# Patient Record
Sex: Male | Born: 1945
Health system: Southern US, Community
[De-identification: ages and names within clinical notes are randomized; demographics above are authoritative.]

## PROBLEM LIST (undated history)

## (undated) DIAGNOSIS — N183 Chronic kidney disease, stage 3 unspecified: Secondary | ICD-10-CM

## (undated) DIAGNOSIS — I10 Essential (primary) hypertension: Secondary | ICD-10-CM

## (undated) DIAGNOSIS — K635 Polyp of colon: Secondary | ICD-10-CM

## (undated) DIAGNOSIS — N189 Chronic kidney disease, unspecified: Principal | ICD-10-CM

## (undated) DIAGNOSIS — G4730 Sleep apnea, unspecified: Secondary | ICD-10-CM

## (undated) DIAGNOSIS — N400 Enlarged prostate without lower urinary tract symptoms: Secondary | ICD-10-CM

## (undated) DIAGNOSIS — D631 Anemia in chronic kidney disease: Secondary | ICD-10-CM

## (undated) DIAGNOSIS — R0602 Shortness of breath: Secondary | ICD-10-CM

## (undated) DIAGNOSIS — K922 Gastrointestinal hemorrhage, unspecified: Secondary | ICD-10-CM

## (undated) DIAGNOSIS — M545 Low back pain, unspecified: Secondary | ICD-10-CM

## (undated) DIAGNOSIS — I2510 Atherosclerotic heart disease of native coronary artery without angina pectoris: Secondary | ICD-10-CM

## (undated) DIAGNOSIS — Z720 Tobacco use: Secondary | ICD-10-CM

## (undated) DIAGNOSIS — B353 Tinea pedis: Secondary | ICD-10-CM

## (undated) DIAGNOSIS — E119 Type 2 diabetes mellitus without complications: Secondary | ICD-10-CM

## (undated) DIAGNOSIS — E7800 Pure hypercholesterolemia, unspecified: Secondary | ICD-10-CM

## (undated) DIAGNOSIS — K219 Gastro-esophageal reflux disease without esophagitis: Secondary | ICD-10-CM

## (undated) DIAGNOSIS — G629 Polyneuropathy, unspecified: Secondary | ICD-10-CM

## (undated) DIAGNOSIS — I160 Hypertensive urgency: Secondary | ICD-10-CM

## (undated) DIAGNOSIS — E1140 Type 2 diabetes mellitus with diabetic neuropathy, unspecified: Secondary | ICD-10-CM

## (undated) DIAGNOSIS — G8929 Other chronic pain: Secondary | ICD-10-CM

## (undated) DIAGNOSIS — R319 Hematuria, unspecified: Secondary | ICD-10-CM

## (undated) DIAGNOSIS — E785 Hyperlipidemia, unspecified: Secondary | ICD-10-CM

## (undated) DIAGNOSIS — I639 Cerebral infarction, unspecified: Secondary | ICD-10-CM

## (undated) DIAGNOSIS — K5520 Angiodysplasia of colon without hemorrhage: Secondary | ICD-10-CM

## (undated) DIAGNOSIS — D509 Iron deficiency anemia, unspecified: Principal | ICD-10-CM

## (undated) DIAGNOSIS — C61 Malignant neoplasm of prostate: Secondary | ICD-10-CM

## (undated) DIAGNOSIS — E1142 Type 2 diabetes mellitus with diabetic polyneuropathy: Secondary | ICD-10-CM

## (undated) DIAGNOSIS — K31819 Angiodysplasia of stomach and duodenum without bleeding: Secondary | ICD-10-CM

## (undated) DIAGNOSIS — F17200 Nicotine dependence, unspecified, uncomplicated: Secondary | ICD-10-CM

## (undated) DIAGNOSIS — E1165 Type 2 diabetes mellitus with hyperglycemia: Secondary | ICD-10-CM

## (undated) DIAGNOSIS — D649 Anemia, unspecified: Secondary | ICD-10-CM

## (undated) DIAGNOSIS — K5521 Angiodysplasia of colon with hemorrhage: Secondary | ICD-10-CM

## (undated) DIAGNOSIS — I951 Orthostatic hypotension: Secondary | ICD-10-CM

## (undated) DIAGNOSIS — K760 Fatty (change of) liver, not elsewhere classified: Secondary | ICD-10-CM

## (undated) HISTORY — DX: Hypertensive urgency: I16.0

## (undated) HISTORY — DX: Malignant neoplasm of prostate: C61

## (undated) HISTORY — DX: Anemia, unspecified: D64.9

## (undated) HISTORY — DX: Tobacco use: Z72.0

## (undated) HISTORY — DX: Type 2 diabetes mellitus with hyperglycemia: E11.65

## (undated) HISTORY — DX: Nicotine dependence, unspecified, uncomplicated: F17.200

## (undated) HISTORY — DX: Essential (primary) hypertension: I10

## (undated) HISTORY — DX: Anemia in chronic kidney disease: D63.1

## (undated) HISTORY — DX: Chronic kidney disease, unspecified: N18.9

## (undated) HISTORY — DX: Type 2 diabetes mellitus with diabetic neuropathy, unspecified: E11.40

## (undated) HISTORY — DX: Angiodysplasia of colon with hemorrhage: K55.21

## (undated) HISTORY — DX: Tinea pedis: B35.3

## (undated) HISTORY — DX: Hyperlipidemia, unspecified: E78.5

## (undated) HISTORY — DX: Iron deficiency anemia, unspecified: D50.9

## (undated) HISTORY — DX: Shortness of breath: R06.02

---

## 1978-11-16 HISTORY — PX: SHOULDER OPEN ROTATOR CUFF REPAIR: SHX2407

## 1978-11-16 HISTORY — PX: LUMBAR DISC SURGERY: SHX700

## 1997-09-29 ENCOUNTER — Other Ambulatory Visit: Admission: RE | Admit: 1997-09-29 | Discharge: 1997-09-29 | Payer: Self-pay | Admitting: Gastroenterology

## 1998-08-10 ENCOUNTER — Ambulatory Visit (HOSPITAL_COMMUNITY): Admission: RE | Admit: 1998-08-10 | Discharge: 1998-08-10 | Payer: Self-pay | Admitting: Gastroenterology

## 1998-08-13 ENCOUNTER — Emergency Department (HOSPITAL_COMMUNITY): Admission: EM | Admit: 1998-08-13 | Discharge: 1998-08-13 | Payer: Self-pay | Admitting: Emergency Medicine

## 1998-08-13 ENCOUNTER — Encounter: Payer: Self-pay | Admitting: Gastroenterology

## 2000-01-07 ENCOUNTER — Encounter: Payer: Self-pay | Admitting: Gastroenterology

## 2000-01-07 ENCOUNTER — Encounter: Admission: RE | Admit: 2000-01-07 | Discharge: 2000-01-07 | Payer: Self-pay | Admitting: Gastroenterology

## 2000-11-03 ENCOUNTER — Encounter: Payer: Self-pay | Admitting: Family Medicine

## 2000-11-03 ENCOUNTER — Encounter: Admission: RE | Admit: 2000-11-03 | Discharge: 2000-11-03 | Payer: Self-pay | Admitting: Family Medicine

## 2000-11-12 ENCOUNTER — Encounter: Payer: Self-pay | Admitting: Gastroenterology

## 2000-11-12 ENCOUNTER — Ambulatory Visit (HOSPITAL_COMMUNITY): Admission: RE | Admit: 2000-11-12 | Discharge: 2000-11-12 | Payer: Self-pay | Admitting: Gastroenterology

## 2001-10-22 ENCOUNTER — Encounter: Payer: Self-pay | Admitting: Orthopedic Surgery

## 2001-10-29 ENCOUNTER — Inpatient Hospital Stay (HOSPITAL_COMMUNITY): Admission: RE | Admit: 2001-10-29 | Discharge: 2001-10-30 | Payer: Self-pay | Admitting: Orthopedic Surgery

## 2002-01-14 ENCOUNTER — Ambulatory Visit (HOSPITAL_COMMUNITY): Admission: RE | Admit: 2002-01-14 | Discharge: 2002-01-14 | Payer: Self-pay | Admitting: Orthopedic Surgery

## 2003-10-17 ENCOUNTER — Ambulatory Visit (HOSPITAL_COMMUNITY): Admission: RE | Admit: 2003-10-17 | Discharge: 2003-10-17 | Payer: Self-pay | Admitting: Gastroenterology

## 2004-03-21 ENCOUNTER — Ambulatory Visit: Payer: Self-pay | Admitting: Family Medicine

## 2004-07-19 ENCOUNTER — Ambulatory Visit: Payer: Self-pay | Admitting: Family Medicine

## 2004-07-29 ENCOUNTER — Ambulatory Visit: Payer: Self-pay | Admitting: Family Medicine

## 2004-08-01 ENCOUNTER — Ambulatory Visit: Payer: Self-pay | Admitting: Family Medicine

## 2004-10-30 ENCOUNTER — Ambulatory Visit: Payer: Self-pay | Admitting: Family Medicine

## 2005-10-14 ENCOUNTER — Ambulatory Visit: Payer: Self-pay | Admitting: Family Medicine

## 2005-10-27 ENCOUNTER — Ambulatory Visit: Payer: Self-pay | Admitting: Family Medicine

## 2005-12-01 ENCOUNTER — Ambulatory Visit: Payer: Self-pay | Admitting: Family Medicine

## 2006-01-16 ENCOUNTER — Ambulatory Visit: Payer: Self-pay | Admitting: Family Medicine

## 2007-02-12 ENCOUNTER — Encounter: Admission: RE | Admit: 2007-02-12 | Discharge: 2007-02-12 | Payer: Self-pay | Admitting: Orthopedic Surgery

## 2007-03-03 ENCOUNTER — Ambulatory Visit (HOSPITAL_COMMUNITY): Admission: RE | Admit: 2007-03-03 | Discharge: 2007-03-04 | Payer: Self-pay | Admitting: Orthopedic Surgery

## 2009-03-17 HISTORY — PX: INGUINAL HERNIA REPAIR: SUR1180

## 2009-06-11 ENCOUNTER — Emergency Department (HOSPITAL_BASED_OUTPATIENT_CLINIC_OR_DEPARTMENT_OTHER): Admission: EM | Admit: 2009-06-11 | Discharge: 2009-06-11 | Payer: Self-pay | Admitting: Emergency Medicine

## 2009-12-16 ENCOUNTER — Emergency Department (HOSPITAL_BASED_OUTPATIENT_CLINIC_OR_DEPARTMENT_OTHER): Admission: EM | Admit: 2009-12-16 | Discharge: 2009-12-16 | Payer: Self-pay | Admitting: Emergency Medicine

## 2009-12-16 ENCOUNTER — Ambulatory Visit: Payer: Self-pay | Admitting: Diagnostic Radiology

## 2010-05-30 LAB — COMPREHENSIVE METABOLIC PANEL
Albumin: 3.2 g/dL — ABNORMAL LOW (ref 3.5–5.2)
Calcium: 8.8 mg/dL (ref 8.4–10.5)
Potassium: 3.9 mEq/L (ref 3.5–5.1)
Sodium: 143 mEq/L (ref 135–145)
Total Bilirubin: 0.6 mg/dL (ref 0.3–1.2)
Total Protein: 7.1 g/dL (ref 6.0–8.3)

## 2010-05-30 LAB — URINALYSIS, ROUTINE W REFLEX MICROSCOPIC
Bilirubin Urine: NEGATIVE
Leukocytes, UA: NEGATIVE
Protein, ur: >300 mg/dL — AB
Specific Gravity, Urine: 1.029 (ref 1.005–1.030)
pH: 5.5 (ref 5.0–8.0)

## 2010-05-30 LAB — URINE CULTURE
Colony Count: NO GROWTH
Culture  Setup Time: 201110022005

## 2010-05-30 LAB — CBC
HCT: 38.1 % — ABNORMAL LOW (ref 39.0–52.0)
Hemoglobin: 12.6 g/dL — ABNORMAL LOW (ref 13.0–17.0)
MCH: 27.6 pg (ref 26.0–34.0)
MCHC: 33.1 g/dL (ref 30.0–36.0)
MCV: 83.6 fL (ref 78.0–100.0)
Platelets: 266 10*3/uL (ref 150–400)
RBC: 4.56 MIL/uL (ref 4.22–5.81)

## 2010-05-30 LAB — DIFFERENTIAL
Basophils Absolute: 0.1 10*3/uL (ref 0.0–0.1)
Basophils Relative: 1 % (ref 0–1)
Eosinophils Absolute: 0.2 10*3/uL (ref 0.0–0.7)
Neutrophils Relative %: 67 % (ref 43–77)

## 2010-05-30 LAB — URINE MICROSCOPIC-ADD ON

## 2010-06-10 LAB — BASIC METABOLIC PANEL
CO2: 28 mEq/L (ref 19–32)
Calcium: 9.2 mg/dL (ref 8.4–10.5)
Chloride: 105 mEq/L (ref 96–112)
GFR calc Af Amer: >60 mL/min (ref >60)
Glucose, Bld: 161 mg/dL — ABNORMAL HIGH (ref 70–99)
Potassium: 3.6 mEq/L (ref 3.5–5.1)
Sodium: 145 mEq/L (ref 135–145)

## 2010-06-10 LAB — CBC: RDW: 18.5 % — ABNORMAL HIGH (ref 11.5–15.5)

## 2010-06-10 LAB — HEMOCCULT GUIAC POC 1CARD (OFFICE): Fecal Occult Bld: POSITIVE

## 2010-07-30 NOTE — Op Note (Signed)
[name], YIELDING                  ACCOUNT NO.:  [id number]   MEDICAL RECORD NO.:  [id number]          PATIENT TYPE:  AMB   LOCATION:  DAY                          FACILITY:  [facility]   PHYSICIAN:  Marlowe Kays, M.D.  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   POSTOPERATIVE DIAGNOSIS:  Right leg pain secondary to lateral recess and  foraminal stenosis, L4-L5 and L5-S1 on the right, no disc herniation  identified.   OPERATION:  Total laminectomy L5, partial laminectomy L4 and sacrum on  the right, with decompression of the L4, L5, and S1 nerve roots, and  inspection of the L4-L5 and L5-S1 discs.   SURGEON:  Marlowe Kays, M.D.   ASSISTANT:  Jene Every, M.D.   ANESTHESIA:  General.   PATHOLOGY AND JUSTIFICATION FOR PROCEDURE:  He is an insulin dependent  diabetic but is having no left leg symptoms.  He has pain in the right  leg with paresthesias in the right foot.  Myelogram has demonstrated an  extradural defect at L4-L5 on the right and the accompanying CT scan  suspected disc herniations at both L4-L5 and L5-S1 on the right, with  some foraminal stenosis at L5-S1.  Consequently, the above mentioned  surgical procedure was performed.   DESCRIPTION OF PROCEDURE:  Prophylactic antibiotics, satisfactory  general anesthesia, knee chest position on the Slaton frame.  The back  was prepped with DuraPrep and with two spinal needles and lateral x-ray,  I tentatively located the L4-L5 and L5-S1 interspaces.  I then continued  draping the back in a sterile field, time out performed.  A midline  incision tagging what I thought were the spinous processes of L4 and L5  with Kocher clamps and  lateral x-ray confirmed that this was the case.  I then completed dissected soft tissue off the lamina of L4, L5, and the  sacrum, and placed a self-retaining McCullough retractor.  First, with  the operating microscope, we began the decompression, removing because  of his stenosis, essentially the entire lamina of L5 and I also  undermined the superior portion of the sacrum, the under surface of L4,  small curette and with 2 and later 3 mm Kerrison rongeurs, began  removing bone and ligamentum flavum.  When the dissection became more  difficult, we brought in the microscope and completed the decompression  at both levels.  We the L4, L5, and S1 nerve roots.  At the conclusion  of the decompression, there was no stenosis present.  We inspected both  the L5-S1 and L4-L5 discs, both of which were identified by x-ray,  particularly at L4-L5, which had demonstrated a more pronounced possible  disc herniation.  Both discs were hard.  At the conclusion of the case,  I felt like we had thoroughly decompressed the right lateral spinal  canal and the L3 foramen.  There was no unusual bleeding.  Gelfoam  soaked in thrombin was placed over both dissection sites and over the  dura.  The self-retaining retractors were removed, there was no unusual  bleeding.  I then closed the wound in layers with interrupted #1 Vicryl  in the fascia leaving a small area distally for egress of fluid, a  combination of #1 and 2-0 Vicryl in the subcutaneous tissue, and staples  in the skin.  Betadine and dry,  sterile dressing were applied.  He tolerated the procedure well and was  taken to the recovery room in satisfactory condition with no known  complications.  Estimated blood loss is, perhaps, 200 mL.  No blood  replacement.  In the PACU, his right leg pain and paresthesias had been  completely relieved.           ______________________________  Marlowe Kays, M.D.     JA/MEDQ  D:  03/03/2007  T:   03/03/2007  Job:  [id number]

## 2010-08-02 NOTE — H&P (Signed)
NAME:  [name], [name]                            ACCOUNT NO.:  [id number]   MEDICAL RECORD NO.:  [id number]                   PATIENT TYPE:  INP   LOCATION:  NA                                   FACILITY:  [facility]   PHYSICIAN:  James P. Aplington, M.D.            DATE OF BIRTH:  June 18, 1945   DATE OF ADMISSION:  10/29/2001  DATE OF DISCHARGE:                                HISTORY & PHYSICAL   CHIEF COMPLAINT:  Left shoulder pain.   HISTORY OF PRESENT ILLNESS:  The patient is a very pleasant 65 year old male  who has had chronic and continued left shoulder pain despite conservative  measures.  He has had both a cervical MRI as well as MRI of his left  shoulder.  The most remarkable finding was a full-thickness with mild  retraction rotator cuff tear.  He also had a noted paracentral protrusion at  C3-4 and to the left, which was his only finding.  He has demonstrated  significant rotator cuff weakness.  He is having difficulty with any  strength-type maneuvers.  At this time with his findings it is felt that the  first step would be to repair his rotator cuff.  The risks and benefits were  discussed with the patient, and at this time he wished to proceed.   PAST MEDICAL HISTORY:  1. Non-insulin-dependent.  2. GERD.   PAST SURGICAL HISTORY:  None.   ALLERGIES:  No known drug allergies.   MEDICATIONS:  1. Nexium 20 mg b.i.d.  2. Glucotrol XL 10 mg b.i.d.  3. Metformin 1000 mg q.d.   SOCIAL HISTORY:  He is right-hand dominant.  Works full-time in [employer].  He does smoke.  Uses rare alcohol.  Is married.  Will have some  help postoperatively.   FAMILY HISTORY:  Brother died of a heart attack at age 73.  He has two  brothers also with cancer, one in the lung, one in the stomach, and mother  with diabetes.   REVIEW OF SYSTEMS:  The patient denies any recent fevers, chills, night  sweats.  No bleeding tendencies.  CNS:  No blurred or double vision,  seizures,  headaches, or paralysis.  RESPIRATORY:  No shortness of breath,  productive cough, hemoptysis.  CARDIOVASCULAR:  No chest pain, angina,  orthopnea.  GASTROINTESTINAL:  No nausea, vomiting, constipation, melena, or  bloody stools.  GENITOURINARY:  No dysuria or hematuria.  MUSCULOSKELETAL:  As in present illness.   PHYSICAL EXAMINATION:  GENERAL:  Well-developed, well-nourished 65 year old  male.  VITAL SIGNS:  Blood pressure 128/82 today.  HEENT:  Normocephalic.  NECK:  Supple.  No lymphadenopathy.  No carotid bruits.  CHEST:  Clear to auscultation bilaterally.  No rales.  No rhonchi.  HEART:  Regular rate and rhythm.  No murmurs, gallops, rubs, heaves, or  thrills.  ABDOMEN:  Positive bowel sounds.  Soft and nontender.  EXTREMITIES:  Left upper extremity as in history of present illness.  He had  no pitting edema.  Positive distal pulses.   IMPRESSION:  1. Left rotator cuff tear.  2. Mild cervical degenerative disk.  3. Non-insulin-dependent.  4. Gastroesophageal reflux disease.   PLAN:  Open rotator cuff repair on the left shoulder.  He did receive  medical clearance from his medical physician, Dr. Kelle Darting.      Tracy A. Shuford, P.A.-C.                 Illene Labrador. Aplington, M.D.    TAS/MEDQ  D:  10/21/2001  T:  10/24/2001  Job:  [id number]   cc:   Tinnie Gens A. Tawanna Cooler, M.D. [facility]

## 2010-08-02 NOTE — Op Note (Signed)
[name], [name]                           ACCOUNT NO.:  [id number]   MEDICAL RECORD NO.:  [id number]                   PATIENT TYPE:  INP   LOCATION:  0477                                 FACILITY:  [facility]   PHYSICIAN:  Illene Labrador. Hill, M.D.            DATE OF BIRTH:  09-17-1945   DATE OF PROCEDURE:  10/29/2001  DATE OF DISCHARGE:  10/30/2001                                 OPERATIVE REPORT   PREOPERATIVE DIAGNOSIS:  Torn rotator cuff, left shoulder.   POSTOPERATIVE DIAGNOSIS:  .  Torn rotator cuff, left shoulder.   OPERATION:  1. Anterior acromionectomy.  2. Repair of torn rotator cuff, left shoulder.   SURGEON:  Illene Labrador. Hill, M.D.   ASSISTANT:  Clarene Reamer, PA-C   ANESTHESIA:  General.   PATHOLOGY AND JUSTIFICATION FOR PROCEDURE:  Significant pain left shoulder  with MRI demonstrating a tear of the anterior rotator cuff, specifically the  supraspinatus with some retraction.  At surgery, he had a much more  significant tear than the MRI would lead one to believe.  Most of it seemed  to be due to a very tight subacromial arch with maceration over a good bit  of the supraspinatus with tear all the way from the interior greater  tuberosity anteriorly and inferiorly.   PROCEDURE:  Prophylactic antibiotics, satisfactory general anesthesia, beach-  chair position on the Schlein frame.  The left shoulder girdle was prepped  and draped in a sterile field.  Ioban employed.  Vertical incision from  roughly the C joint down to the greater tuberosity.  The fascia on the  anterior acromion was opened in line with the skin incision from [facility] joint  down, splitting the deltoid for 1-2 cm.  The [facility] joint was identified with  Mellody Dance needle, and my initial anterior acromionectomy performed, protecting  the underlying rotator cuff with a Cobb elevator.  I had to do a good bit of  additional bone resection on the underneath surface of the anterior acromion  because of the severe  impingement problem.  He had a good bit of subdeltoid  bursa which I resected.  A lot of this was adherent to the rotator cuff up  beneath the remaining acromion.  Once I had obtained visualized of the  severe tear as described above was noted.  I began by mobilizing the shared  posterior rotator cuff and advancing it and attaching it to good lateral  remaining cuff at the greater tuberosity and just anterior to it with  multiple interrupted #1 Ethibond suture.  Once I had stabilized this  portion, I then began mobilizing the anteroinferior portion and attaching it  to the subscapularis fascia.  I then worked anteriorly and lateralward until  I was able to reattach the rotator cuff back from its point of origin.  I  also further stabilized this with a super Mitek anchor.  This seemed to give  a nice stable repair with good tissue integrity with his arm to his side.  He had had a preoperative stellate ganglion block so that no Marcaine or  adrenalin was used at this point.  The wound was irrigated with sterile  saline and closure performed.  The deltoid muscle and fascia over the  anterior acromion in one layer of interrupted #1 Vicryl with a good stable  repair.  The subcutaneous tissue was closed with a combination of 2-0 Vicryl  deep, 4-0 Vicryl superficially with Steri-Strips on the skin.  Dry sterile  dressing and shoulder immobilizer applied.  He tolerated the procedure well  and was taken to the recovery room in satisfactory condition with no known  complications.                                               [name], M.D.    JPA/MEDQ  D:  10/29/2001  T:  10/31/2001  Job:  [id number]

## 2010-08-02 NOTE — Op Note (Signed)
NAME:  [name], [name]                            ACCOUNT NO.:  [id number]   MEDICAL RECORD NO.:  [id number]                   PATIENT TYPE:  AMB   LOCATION:  ENDO                                 FACILITY:  [facility]   PHYSICIAN:  John C. Madilyn Fireman, M.D.                 DATE OF BIRTH:  04/04/1945   DATE OF PROCEDURE:  10/17/2003  DATE OF DISCHARGE:                                 OPERATIVE REPORT   PROCEDURE:  Colonoscopy.   INDICATION FOR PROCEDURE:  Family history of colon cancer in a first degree  relative.   PROCEDURE:  The patient was placed in the left lateral decubitus position  and placed on the pulse monitor with continuous low flow oxygen delivered by  nasal cannula.  He was sedated with 2 mg IV Versed in addition to medicine  to the medicine given for the previous EGD.  The Olympus video colonoscope  was inserted into the rectum and advanced to the cecum, confirmed by  transillumination of McBurney's point and visualization of the ileocecal  valve and appendiceal orifice.  The prep was excellent.  The cecum,  ascending, transverse, descending and sigmoid colon all appeared normal with  no masses, polyps, diverticula or other mucosal abnormalities.  The rectum  likewise appeared normal.  On retroflexed view, the anus revealed no obvious  internal hemorrhoids.  The scope was then withdrawn and the patient returned  to the recovery room in stable condition.  He tolerated the procedure well  and there were no immediate complications.   IMPRESSION:  Normal colonoscopy.   PLAN:  Repeat study in 5 years.                                               John C. Madilyn Fireman, M.D.    JCH/MEDQ  D:  10/17/2003  T:  10/17/2003  Job:  [id number]   cc:   Tinnie Gens A. Tawanna Cooler, M.D. [facility]

## 2010-08-02 NOTE — Op Note (Signed)
NAME:  [name], [name]                            ACCOUNT NO.:  [id number]   MEDICAL RECORD NO.:  [id number]                   PATIENT TYPE:  AMB   LOCATION:  ENDO                                 FACILITY:  [facility]   PHYSICIAN:  John C. Madilyn Fireman, M.D.                 DATE OF BIRTH:  04-11-1945   DATE OF PROCEDURE:  10/17/2003  DATE OF DISCHARGE:                                 OPERATIVE REPORT   PROCEDURE:  Esophagogastroduodenoscopy.   INDICATIONS FOR PROCEDURE:  Chronic nausea and heme positive stool.  Patient  is also undergoing colonoscopy due to a family history of colon cancer in a  first-degree relative.   PROCEDURE:  The patient was placed in the left lateral decubitus position  and placed on the pulse monitor with continuous low flow oxygen delivered by  nasal cannula.  He was sedated with 50 mcg IV fentanyl and 5 mg IV Versed.  The Olympus video endoscope was advanced under direct vision into the  oropharynx and esophagus.  The esophagus was straight and of normal caliber  with the squamocolumnar line at 38 cm.  There was no visible hiatal hernia,  ring, stricture, or other abnormalities of the GE junction.  The stomach was  entered, and a small amount of liquid secretions were suctioned from the  fundus.  Retroflexed view of the cardia was unremarkable.  The fundus and  body appeared normal.  The antrum showed erythema and granularity with a  slightly cholangiectasis appearance, consistent with mild-to-moderate antral  gastritis.  There were no focal erosions or ulcers.  The pylorus was no  deformity.  It allowed passage of the endoscope, tipping the duodenum.  Both  bulb and second portion were well inspected and appeared to be within normal  limits.  The scope was withdrawn back into the stomach, and a CLO test  obtained.  The scope was then withdrawn, and the patient returned to the  recovery room in stable condition.  He tolerated the procedure well, and  there were no  immediate complications.   IMPRESSION:  Mild antral gastritis, otherwise normal study.   PLAN:  Await CLO test.  Treat for eradication of Helicobacter if positive.  If negative, consider a course of Reglan for the possibility of viral  diabetic gastroparesis.  We will proceed to colonoscopy.                                               John C. Madilyn Fireman, M.D.    JCH/MEDQ  D:  10/17/2003  T:  10/17/2003  Job:  [id number]   cc:   Tinnie Gens A. Tawanna Cooler, M.D. [facility]

## 2010-10-04 ENCOUNTER — Other Ambulatory Visit: Payer: Self-pay | Admitting: Hematology & Oncology

## 2010-10-04 ENCOUNTER — Ambulatory Visit (HOSPITAL_BASED_OUTPATIENT_CLINIC_OR_DEPARTMENT_OTHER): Payer: Medicare Other | Admitting: Hematology & Oncology

## 2010-10-04 DIAGNOSIS — K2270 Barrett's esophagus without dysplasia: Secondary | ICD-10-CM

## 2010-10-04 DIAGNOSIS — E119 Type 2 diabetes mellitus without complications: Secondary | ICD-10-CM

## 2010-10-04 DIAGNOSIS — K219 Gastro-esophageal reflux disease without esophagitis: Secondary | ICD-10-CM

## 2010-10-04 DIAGNOSIS — D509 Iron deficiency anemia, unspecified: Secondary | ICD-10-CM

## 2010-10-04 LAB — CBC WITH DIFFERENTIAL (CANCER CENTER ONLY)
BASO%: 0.4 % (ref 0.0–2.0)
EOS%: 2.7 % (ref 0.0–7.0)
Eosinophils Absolute: 0.1 10*3/uL (ref 0.0–0.5)
LYMPH#: 1.6 10*3/uL (ref 0.9–3.3)
LYMPH%: 30.9 % (ref 14.0–48.0)
MCV: 79 fL — ABNORMAL LOW (ref 82–98)
MONO#: 0.5 10*3/uL (ref 0.1–0.9)
MONO%: 10.1 % (ref 0.0–13.0)
NEUT%: 55.9 % (ref 40.0–80.0)
Platelets: 284 10*3/uL (ref 145–400)
RDW: 20 % — ABNORMAL HIGH (ref 11.1–15.7)
WBC: 5.2 10*3/uL (ref 4.0–10.0)

## 2010-10-04 LAB — CHCC SATELLITE - SMEAR

## 2010-10-08 LAB — RETICULOCYTES (CHCC)
ABS Retic: 70.2 10*3/uL (ref 19.0–186.0)
Retic Ct Pct: 1.5 % (ref 0.4–2.3)

## 2010-10-08 LAB — HEMOGLOBINOPATHY EVALUATION
Hgb F Quant: 0.4 % (ref 0.0–2.0)
Hgb S Quant: 0 % (ref 0.0–0.0)

## 2010-10-08 LAB — TESTOSTERONE: Testosterone: 374.03 ng/dL (ref 250–890)

## 2010-10-08 LAB — IRON AND TIBC
Iron: 68 ug/dL (ref 42–165)
UIBC: 257 ug/dL

## 2010-10-08 LAB — TRANSFERRIN RECEPTOR, SOLUABLE: Transferrin Receptor, Soluble: 3.52 mg/L — ABNORMAL HIGH (ref 0.76–1.76)

## 2010-10-10 ENCOUNTER — Encounter (HOSPITAL_BASED_OUTPATIENT_CLINIC_OR_DEPARTMENT_OTHER): Payer: Medicare Other | Admitting: Hematology & Oncology

## 2010-10-10 DIAGNOSIS — D509 Iron deficiency anemia, unspecified: Secondary | ICD-10-CM

## 2010-11-14 ENCOUNTER — Encounter (HOSPITAL_BASED_OUTPATIENT_CLINIC_OR_DEPARTMENT_OTHER): Payer: Medicare Other | Admitting: Hematology & Oncology

## 2010-11-14 ENCOUNTER — Other Ambulatory Visit: Payer: Self-pay | Admitting: Hematology & Oncology

## 2010-11-14 ENCOUNTER — Ambulatory Visit (HOSPITAL_BASED_OUTPATIENT_CLINIC_OR_DEPARTMENT_OTHER)
Admission: RE | Admit: 2010-11-14 | Discharge: 2010-11-14 | Disposition: A | Discharge status: Home / Self Care | Payer: Medicare Other | Source: Ambulatory Visit | Attending: Hematology & Oncology | Admitting: Hematology & Oncology

## 2010-11-14 DIAGNOSIS — R52 Pain, unspecified: Secondary | ICD-10-CM

## 2010-11-14 DIAGNOSIS — K219 Gastro-esophageal reflux disease without esophagitis: Secondary | ICD-10-CM

## 2010-11-14 DIAGNOSIS — K2270 Barrett's esophagus without dysplasia: Secondary | ICD-10-CM

## 2010-11-14 DIAGNOSIS — R109 Unspecified abdominal pain: Secondary | ICD-10-CM | POA: Insufficient documentation

## 2010-11-14 DIAGNOSIS — E119 Type 2 diabetes mellitus without complications: Secondary | ICD-10-CM

## 2010-11-14 DIAGNOSIS — K824 Cholesterolosis of gallbladder: Secondary | ICD-10-CM | POA: Insufficient documentation

## 2010-11-14 DIAGNOSIS — D509 Iron deficiency anemia, unspecified: Secondary | ICD-10-CM

## 2010-11-14 DIAGNOSIS — N281 Cyst of kidney, acquired: Secondary | ICD-10-CM | POA: Insufficient documentation

## 2010-11-14 DIAGNOSIS — M549 Dorsalgia, unspecified: Secondary | ICD-10-CM | POA: Insufficient documentation

## 2010-11-14 LAB — CBC WITH DIFFERENTIAL (CANCER CENTER ONLY)
BASO%: 0.7 % (ref 0.0–2.0)
HCT: 34.3 % — ABNORMAL LOW (ref 38.7–49.9)
LYMPH%: 27.3 % (ref 14.0–48.0)
MCV: 82 fL (ref 82–98)
MONO#: 0.7 10*3/uL (ref 0.1–0.9)
NEUT%: 55.8 % (ref 40.0–80.0)
RDW: 17.9 % — ABNORMAL HIGH (ref 11.1–15.7)
WBC: 5.7 10*3/uL (ref 4.0–10.0)

## 2010-11-17 LAB — IRON AND TIBC
%SAT: 7 % — ABNORMAL LOW (ref 20–55)
Iron: 21 ug/dL — ABNORMAL LOW (ref 42–165)
TIBC: 295 ug/dL (ref 215–435)
UIBC: 274 ug/dL (ref 125–400)

## 2010-11-17 LAB — COMPREHENSIVE METABOLIC PANEL
AST: 14 U/L (ref 0–37)
Albumin: 3.7 g/dL (ref 3.5–5.2)
BUN: 7 mg/dL (ref 6–23)
Calcium: 8.7 mg/dL (ref 8.4–10.5)
Chloride: 105 mEq/L (ref 96–112)
Potassium: 3.7 mEq/L (ref 3.5–5.3)

## 2010-11-17 LAB — RETICULOCYTES (CHCC)
ABS Retic: 106.8 K/uL (ref 19.0–186.0)
RBC.: 4.27 MIL/uL (ref 4.22–5.81)
Retic Ct Pct: 2.5 % — ABNORMAL HIGH (ref 0.4–2.3)

## 2010-11-17 LAB — FERRITIN: Ferritin: 25 ng/mL (ref 22–322)

## 2010-11-19 ENCOUNTER — Encounter (HOSPITAL_BASED_OUTPATIENT_CLINIC_OR_DEPARTMENT_OTHER): Payer: Medicare Other | Admitting: Hematology & Oncology

## 2010-11-19 DIAGNOSIS — D509 Iron deficiency anemia, unspecified: Secondary | ICD-10-CM

## 2010-12-09 ENCOUNTER — Encounter (HOSPITAL_BASED_OUTPATIENT_CLINIC_OR_DEPARTMENT_OTHER): Payer: Medicare Other | Admitting: Hematology & Oncology

## 2010-12-09 ENCOUNTER — Other Ambulatory Visit: Payer: Self-pay | Admitting: Hematology & Oncology

## 2010-12-09 DIAGNOSIS — K219 Gastro-esophageal reflux disease without esophagitis: Secondary | ICD-10-CM

## 2010-12-09 DIAGNOSIS — K2270 Barrett's esophagus without dysplasia: Secondary | ICD-10-CM

## 2010-12-09 DIAGNOSIS — D509 Iron deficiency anemia, unspecified: Secondary | ICD-10-CM

## 2010-12-09 DIAGNOSIS — R634 Abnormal weight loss: Secondary | ICD-10-CM

## 2010-12-09 DIAGNOSIS — D539 Nutritional anemia, unspecified: Secondary | ICD-10-CM

## 2010-12-09 LAB — CBC WITH DIFFERENTIAL (CANCER CENTER ONLY)
BASO%: 0.6 % (ref 0.0–2.0)
EOS%: 3.3 % (ref 0.0–7.0)
Eosinophils Absolute: 0.2 10*3/uL (ref 0.0–0.5)
MCH: 29.4 pg (ref 28.0–33.4)
MCHC: 34.1 g/dL (ref 32.0–35.9)
MONO%: 17.3 % — ABNORMAL HIGH (ref 0.0–13.0)
NEUT#: 2.3 10*3/uL (ref 1.5–6.5)
Platelets: 325 10*3/uL (ref 145–400)
RBC: 3.84 10*6/uL — ABNORMAL LOW (ref 4.20–5.70)
RDW: 17.7 % — ABNORMAL HIGH (ref 11.1–15.7)

## 2010-12-09 LAB — IRON AND TIBC: %SAT: 15 % — ABNORMAL LOW (ref 20–55)

## 2010-12-09 LAB — RETICULOCYTES (CHCC): Retic Ct Pct: 3.6 % — ABNORMAL HIGH (ref 0.4–2.3)

## 2010-12-09 LAB — CHCC SATELLITE - SMEAR

## 2010-12-10 ENCOUNTER — Ambulatory Visit (INDEPENDENT_AMBULATORY_CARE_PROVIDER_SITE_OTHER)
Admission: RE | Admit: 2010-12-10 | Discharge: 2010-12-10 | Disposition: A | Discharge status: Home / Self Care | Payer: Medicare Other | Source: Ambulatory Visit | Attending: Hematology & Oncology | Admitting: Hematology & Oncology

## 2010-12-10 ENCOUNTER — Ambulatory Visit (HOSPITAL_BASED_OUTPATIENT_CLINIC_OR_DEPARTMENT_OTHER)
Admission: RE | Admit: 2010-12-10 | Discharge: 2010-12-10 | Disposition: A | Discharge status: Home / Self Care | Payer: Medicare Other | Source: Ambulatory Visit | Attending: Hematology & Oncology | Admitting: Hematology & Oncology

## 2010-12-10 DIAGNOSIS — K7689 Other specified diseases of liver: Secondary | ICD-10-CM

## 2010-12-10 DIAGNOSIS — R109 Unspecified abdominal pain: Secondary | ICD-10-CM

## 2010-12-10 DIAGNOSIS — R634 Abnormal weight loss: Secondary | ICD-10-CM

## 2010-12-10 DIAGNOSIS — F17200 Nicotine dependence, unspecified, uncomplicated: Secondary | ICD-10-CM | POA: Insufficient documentation

## 2010-12-10 DIAGNOSIS — R911 Solitary pulmonary nodule: Secondary | ICD-10-CM | POA: Insufficient documentation

## 2010-12-10 DIAGNOSIS — I10 Essential (primary) hypertension: Secondary | ICD-10-CM | POA: Insufficient documentation

## 2010-12-10 DIAGNOSIS — J984 Other disorders of lung: Secondary | ICD-10-CM

## 2010-12-10 DIAGNOSIS — K5900 Constipation, unspecified: Secondary | ICD-10-CM

## 2010-12-10 DIAGNOSIS — R0602 Shortness of breath: Secondary | ICD-10-CM | POA: Insufficient documentation

## 2010-12-10 DIAGNOSIS — E119 Type 2 diabetes mellitus without complications: Secondary | ICD-10-CM | POA: Insufficient documentation

## 2010-12-10 MED ORDER — IOHEXOL 300 MG/ML  SOLN
100.0000 mL | Freq: Once | INTRAMUSCULAR | Status: AC | PRN
  Administered 2010-12-10: 100 mL via INTRAVENOUS

## 2010-12-12 ENCOUNTER — Encounter (HOSPITAL_BASED_OUTPATIENT_CLINIC_OR_DEPARTMENT_OTHER): Payer: Medicare Other | Admitting: Hematology & Oncology

## 2010-12-12 DIAGNOSIS — D509 Iron deficiency anemia, unspecified: Secondary | ICD-10-CM

## 2010-12-20 LAB — URINALYSIS, ROUTINE W REFLEX MICROSCOPIC
Bilirubin Urine: NEGATIVE
Leukocytes, UA: NEGATIVE
Nitrite: NEGATIVE
Specific Gravity, Urine: 1.034 — ABNORMAL HIGH
pH: 5.5

## 2010-12-20 LAB — BASIC METABOLIC PANEL
BUN: 12
CO2: 27
Calcium: 9.5
Chloride: 103
Creatinine, Ser: 0.92
GFR calc Af Amer: >60
GFR calc non Af Amer: >60
Glucose, Bld: 210 — ABNORMAL HIGH
Potassium: 3.9
Sodium: 139

## 2010-12-20 LAB — CBC
MCHC: 33.2
Platelets: 328
RBC: 3.59 — ABNORMAL LOW
RDW: 17.2 — ABNORMAL HIGH

## 2010-12-20 LAB — HEMOGLOBIN AND HEMATOCRIT, BLOOD
HCT: 33.8 — ABNORMAL LOW
Hemoglobin: 11 — ABNORMAL LOW

## 2010-12-20 LAB — URINE MICROSCOPIC-ADD ON

## 2011-01-01 ENCOUNTER — Encounter (HOSPITAL_BASED_OUTPATIENT_CLINIC_OR_DEPARTMENT_OTHER): Payer: Medicare Other | Admitting: Hematology & Oncology

## 2011-01-01 ENCOUNTER — Other Ambulatory Visit: Payer: Self-pay | Admitting: Hematology & Oncology

## 2011-01-01 DIAGNOSIS — D649 Anemia, unspecified: Secondary | ICD-10-CM

## 2011-01-01 DIAGNOSIS — E119 Type 2 diabetes mellitus without complications: Secondary | ICD-10-CM

## 2011-01-01 DIAGNOSIS — D539 Nutritional anemia, unspecified: Secondary | ICD-10-CM

## 2011-01-01 DIAGNOSIS — D509 Iron deficiency anemia, unspecified: Secondary | ICD-10-CM

## 2011-01-01 DIAGNOSIS — N289 Disorder of kidney and ureter, unspecified: Secondary | ICD-10-CM

## 2011-01-01 DIAGNOSIS — K219 Gastro-esophageal reflux disease without esophagitis: Secondary | ICD-10-CM

## 2011-01-01 DIAGNOSIS — K2270 Barrett's esophagus without dysplasia: Secondary | ICD-10-CM

## 2011-01-01 LAB — CBC WITH DIFFERENTIAL (CANCER CENTER ONLY)
Eosinophils Absolute: 0.1 10*3/uL (ref 0.0–0.5)
MONO#: 0.7 10*3/uL (ref 0.1–0.9)
NEUT#: 2.9 10*3/uL (ref 1.5–6.5)
Platelets: 314 10*3/uL (ref 145–400)
RBC: 3.83 10*6/uL — ABNORMAL LOW (ref 4.20–5.70)
WBC: 5.2 10*3/uL (ref 4.0–10.0)

## 2011-01-01 LAB — CMP (CANCER CENTER ONLY)
ALT(SGPT): 21 U/L (ref 10–47)
CO2: 28 mEq/L (ref 18–33)
Calcium: 8.7 mg/dL (ref 8.0–10.3)
Chloride: 101 mEq/L (ref 98–108)
Sodium: 140 mEq/L (ref 128–145)
Total Bilirubin: 0.5 mg/dl (ref 0.20–1.60)
Total Protein: 7.6 g/dL (ref 6.4–8.1)

## 2011-01-01 LAB — CHCC SATELLITE - SMEAR

## 2011-01-01 LAB — LACTATE DEHYDROGENASE: LDH: 186 U/L (ref 94–250)

## 2011-01-01 LAB — IRON AND TIBC: %SAT: 9 % — ABNORMAL LOW (ref 20–55)

## 2011-01-07 ENCOUNTER — Encounter (HOSPITAL_BASED_OUTPATIENT_CLINIC_OR_DEPARTMENT_OTHER): Payer: Medicare Other | Admitting: Hematology & Oncology

## 2011-01-07 DIAGNOSIS — D509 Iron deficiency anemia, unspecified: Secondary | ICD-10-CM

## 2011-03-24 ENCOUNTER — Ambulatory Visit: Payer: Medicare Other

## 2011-03-24 ENCOUNTER — Other Ambulatory Visit (HOSPITAL_BASED_OUTPATIENT_CLINIC_OR_DEPARTMENT_OTHER): Payer: Medicare Other | Admitting: Lab

## 2011-03-24 ENCOUNTER — Ambulatory Visit (HOSPITAL_BASED_OUTPATIENT_CLINIC_OR_DEPARTMENT_OTHER): Payer: Medicare Other | Admitting: Hematology & Oncology

## 2011-03-24 ENCOUNTER — Other Ambulatory Visit: Payer: Self-pay | Admitting: Pharmacist

## 2011-03-24 ENCOUNTER — Encounter: Payer: Self-pay | Admitting: Hematology & Oncology

## 2011-03-24 ENCOUNTER — Other Ambulatory Visit: Payer: Self-pay | Admitting: Hematology & Oncology

## 2011-03-24 VITALS — BP 147/76 | HR 100 | Temp 97.3°F | Ht 69.0 in | Wt 165.0 lb

## 2011-03-24 DIAGNOSIS — D509 Iron deficiency anemia, unspecified: Secondary | ICD-10-CM

## 2011-03-24 DIAGNOSIS — K922 Gastrointestinal hemorrhage, unspecified: Secondary | ICD-10-CM

## 2011-03-24 HISTORY — DX: Iron deficiency anemia, unspecified: D50.9

## 2011-03-24 LAB — IRON AND TIBC
Iron: <10 ug/dL — ABNORMAL LOW (ref 42–165)
UIBC: 334 ug/dL (ref 125–400)

## 2011-03-24 LAB — CBC WITH DIFFERENTIAL (CANCER CENTER ONLY)
BASO#: 0.1 10*3/uL (ref 0.0–0.2)
Eosinophils Absolute: 0.1 10*3/uL (ref 0.0–0.5)
HGB: 7.8 g/dL — ABNORMAL LOW (ref 13.0–17.1)
MCH: 27.4 pg — ABNORMAL LOW (ref 28.0–33.4)
MONO#: 0.8 10*3/uL (ref 0.1–0.9)
MONO%: 10.9 % (ref 0.0–13.0)
NEUT#: 5.1 10*3/uL (ref 1.5–6.5)
RBC: 2.85 10*6/uL — ABNORMAL LOW (ref 4.20–5.70)
WBC: 7.5 10*3/uL (ref 4.0–10.0)

## 2011-03-24 MED ORDER — SODIUM CHLORIDE 0.9 % IV SOLN
Freq: Once | INTRAVENOUS | Status: DC
  Administered 2011-03-24: 16:00:00 via INTRAVENOUS

## 2011-03-24 MED ORDER — SODIUM CHLORIDE 0.9 % IV SOLN
1020.0000 mg | Freq: Once | INTRAVENOUS | Status: DC
  Administered 2011-03-24: 1020 mg via INTRAVENOUS
  Filled 2011-03-24: qty 34

## 2011-03-24 NOTE — Progress Notes (Signed)
This office note has been dictated.

## 2011-03-24 NOTE — Progress Notes (Signed)
CC:   [name], M.D.  DIAGNOSES: 1. Recurrent iron-deficiency anemia secondary to gastrointestinal     bleeding. 2. Anemia of renal insufficiency. 3. Insulin-dependent diabetes.  CURRENT THERAPY:  IV iron as indicated.  INTERVAL HISTORY:  [name] comes in for followup.  He is getting over a "cold."  Unfortunately, he is also having black stools.  He says these have been going on for a few weeks.  Of course, he did not say anything about this to his family doctor.  We did give him iron back in October.  At that point in time, his ferritin, I think, was 23.  He got 1,020 mg of iron.  He has not had any abdominal pain.  He is having no cough.  He is having no weight loss or weight gain.  He did well over the holidays.  He did not note any fever.  PHYSICAL EXAMINATION:  General Appearance:  This is a fairly well- developed, well-nourished, black gentleman in no obvious distress. Vital Signs:  Temperature of 97.3.  Pulse 100.  Respiratory rate 16. Blood pressure 147/76.  Weight is 165.  Head and Neck Exam: Normocephalic, atraumatic skull.  There are no ocular or oral lesions. There are no palpable cervical or supraclavicular lymph nodes.  Lungs: Clear to percussion and auscultation bilaterally.  Cardiac Exam: Regular rate and rhythm with a normal S1 and S2.  There are no murmurs, rubs, or bruits.  Abdominal Exam:  Soft with good bowel sounds.  There is no palpable abdominal mass.  There is no fluid wave.  There is no palpable hepatosplenomegaly.  Rectal Exam:  No external hemorrhoids. His prostate is smooth and regular.  No prostate nodules are noted.  His stools are brown but grossly heme positive.  Extremities:  No clubbing, cyanosis, or edema.  Neurological Exam:  No focal neurological deficits.  LABORATORY STUDIES:  White cell count 7.5, hemoglobin 7.8, hematocrit 24, platelet count 393.  MCV is 84.  IMPRESSION:  [name] is a 66 year old gentleman with recurrent  iron- deficiency anemia.  When we last saw him in October, his hemoglobin was 11.6.  Again, his ferritin was low as well as his iron saturation.  He got iron then.  It is apparent that this gastrointestinal bleeding is a real problem for him.  I told him that he really needs to go back to Dr. Julio Sicks and have this looked at.  He does see a gastroenterologist in [facility].  We went ahead and gave him 1,020 mg of IV iron today.  It is possible that he may need some Aranesp.  He has never had Aranesp before.  Hopefully, the iron will help him out.  However, if he continues have the gastrointestinal bleeding, this will become a problem for him.  It is possible he may have some arteriovenous malformations or angiodysplasia within the small bowel.  I want to see him back in 1 month.  We will go ahead and check his hemoglobin at that point in time.  Of note, his erythropoietin level was 56 back in July.  As such, he should respond well to Aranesp if necessary.    ______________________________ [name], M.D. PRE/MEDQ  D:  03/24/2011  T:  03/24/2011  Job:  [id number]

## 2011-03-24 NOTE — Progress Notes (Signed)
Addended by: Anselm Jungling on: 03/24/2011 03:09 PM   Modules accepted: Orders

## 2011-04-04 ENCOUNTER — Telehealth: Payer: Self-pay | Admitting: Hematology & Oncology

## 2011-04-04 NOTE — Telephone Encounter (Signed)
Pt called and cx 04/22/11 apt and resch for 04/25/11

## 2011-04-22 ENCOUNTER — Other Ambulatory Visit: Payer: Medicare Other | Admitting: Lab

## 2011-04-22 ENCOUNTER — Ambulatory Visit: Payer: Medicare Other | Admitting: Hematology & Oncology

## 2011-04-25 ENCOUNTER — Other Ambulatory Visit: Payer: Medicare Other | Admitting: Lab

## 2011-04-25 ENCOUNTER — Ambulatory Visit: Payer: Medicare Other | Admitting: Hematology & Oncology

## 2011-04-25 ENCOUNTER — Telehealth: Payer: Self-pay | Admitting: Hematology & Oncology

## 2011-04-25 NOTE — Telephone Encounter (Signed)
Pt moved 2-7 to 2-28 he had a death in the family

## 2011-05-14 ENCOUNTER — Ambulatory Visit (HOSPITAL_BASED_OUTPATIENT_CLINIC_OR_DEPARTMENT_OTHER): Payer: Medicare Other | Admitting: Hematology & Oncology

## 2011-05-14 ENCOUNTER — Encounter: Payer: Self-pay | Admitting: Hematology & Oncology

## 2011-05-14 ENCOUNTER — Ambulatory Visit (HOSPITAL_BASED_OUTPATIENT_CLINIC_OR_DEPARTMENT_OTHER): Payer: Medicare Other

## 2011-05-14 ENCOUNTER — Other Ambulatory Visit (HOSPITAL_BASED_OUTPATIENT_CLINIC_OR_DEPARTMENT_OTHER): Payer: Medicare Other | Admitting: Lab

## 2011-05-14 DIAGNOSIS — D631 Anemia in chronic kidney disease: Secondary | ICD-10-CM

## 2011-05-14 DIAGNOSIS — D500 Iron deficiency anemia secondary to blood loss (chronic): Secondary | ICD-10-CM

## 2011-05-14 DIAGNOSIS — N289 Disorder of kidney and ureter, unspecified: Secondary | ICD-10-CM

## 2011-05-14 DIAGNOSIS — N189 Chronic kidney disease, unspecified: Secondary | ICD-10-CM

## 2011-05-14 DIAGNOSIS — D649 Anemia, unspecified: Secondary | ICD-10-CM

## 2011-05-14 DIAGNOSIS — D509 Iron deficiency anemia, unspecified: Secondary | ICD-10-CM

## 2011-05-14 HISTORY — DX: Anemia in chronic kidney disease: D63.1

## 2011-05-14 HISTORY — DX: Anemia in chronic kidney disease: N18.9

## 2011-05-14 LAB — CBC WITH DIFFERENTIAL (CANCER CENTER ONLY)
BASO#: 0 10*3/uL (ref 0.0–0.2)
Eosinophils Absolute: 0.1 10*3/uL (ref 0.0–0.5)
HGB: 8.2 g/dL — ABNORMAL LOW (ref 13.0–17.1)
LYMPH#: 1.6 10*3/uL (ref 0.9–3.3)
MCH: 26.3 pg — ABNORMAL LOW (ref 28.0–33.4)
MONO#: 0.7 10*3/uL (ref 0.1–0.9)
NEUT#: 3.3 10*3/uL (ref 1.5–6.5)
RBC: 3.12 10*6/uL — ABNORMAL LOW (ref 4.20–5.70)

## 2011-05-14 LAB — IRON AND TIBC: TIBC: 293 ug/dL (ref 215–435)

## 2011-05-14 LAB — FERRITIN: Ferritin: 16 ng/mL — ABNORMAL LOW (ref 22–322)

## 2011-05-14 LAB — RETICULOCYTES (CHCC)
ABS Retic: 98.6 10*3/uL (ref 19.0–186.0)
RBC.: 3.18 MIL/uL — ABNORMAL LOW (ref 4.22–5.81)

## 2011-05-14 MED ORDER — DARBEPOETIN ALFA-POLYSORBATE 300 MCG/0.6ML IJ SOLN
300.0000 ug | Freq: Once | INTRAMUSCULAR | Status: AC
  Administered 2011-05-14: 300 ug via SUBCUTANEOUS

## 2011-05-14 MED ORDER — SODIUM CHLORIDE 0.9 % IV SOLN
1020.0000 mg | Freq: Once | INTRAVENOUS | Status: AC
  Administered 2011-05-14: 1020 mg via INTRAVENOUS
  Filled 2011-05-14: qty 34

## 2011-05-14 NOTE — Progress Notes (Signed)
This office note has been dictated.

## 2011-05-15 NOTE — Progress Notes (Signed)
CC:   [name], M.D.  DIAGNOSES: 1. Recurrent iron deficiency anemia secondary to gastrointestinal     bleed. 2. Anemia secondary to renal insufficiency. 3. Poorly controlled insulin-dependent diabetes.  CURRENT THERAPY: 1. IV iron as indicated. 2. The patient to start Aranesp 300 mcg subcutaneously every 3-4 weeks     for hemoglobin less than 10.  INTERIM HISTORY:  [name] comes in for follow-up.  We last saw back in early January.  At that point in time, his hemoglobin was 7.8.  His stools were grossly heme positive.  We did go ahead and give him IV iron.  He is being followed by GI in [facility].  He did have an upper endoscopy and colonoscopy.  From what he says, these did not show any obvious bleeding source.  He recently underwent a capsule endoscopy this past Monday.  He does not know the results.  He is having some pain in the right inguinal region where he had a hernia repair a few years ago.  He does feel tired.  He does feel worn out.  His stools are black he says.  PHYSICAL EXAMINATION:  General Appearance:  This is a thin but well- nourished, black gentleman in no obvious distress.  Vital Signs:  Show a temperature of 97.5, pulse 118, respiratory rate 14, blood pressure 118/47.  Weight is 160.  Head and Neck Exam:  Shows a normocephalic, atraumatic skull.  There are no ocular or oral lesions.  There are no palpable cervical or supraclavicular lymph nodes.  Lungs:  Clear to percussion and auscultation bilaterally.  Cardiac Exam:  Tachycardiac but regular.  There are no murmurs, rubs or bruits.  Abdominal Exam: Soft with good bowel sounds.  There is no palpable abdominal mass. There is no palpable hepatosplenomegaly.  Back Exam:  No tenderness over the spine, ribs or hips.  Extremities:  Show no clubbing, cyanosis or edema.  Neurological Exam:  Shows no focal neurological deficits.  Skin Exam:  No rashes, ecchymosis, or petechia.  LABORATORY STUDIES:   White cell count is 5.8, hemoglobin 8.2, hematocrit 25.9, platelet count is 372.  MCV is 83.  IMPRESSION:  [name] is a 66 year old gentleman with recurrent iron deficiency anemia.  He has gastrointestinal blood loss.  He also has renal insufficiency secondary to his poorly controlled diabetes.  We will go ahead and give him IV iron today.  I will give him a dose of 1020 mg.  We will also go ahead and give him a dose of Aranesp 300 mcg.  This hopefully will "jump start" his bone marrow so that he is not as anemic and not as fatigued.  I will plan to see him back in another month for follow-up.    ______________________________ [name], M.D. PRE/MEDQ  D:  05/14/2011  T:  05/15/2011  Job:  [id number]

## 2011-06-04 ENCOUNTER — Other Ambulatory Visit (HOSPITAL_BASED_OUTPATIENT_CLINIC_OR_DEPARTMENT_OTHER): Payer: Medicare Other | Admitting: Lab

## 2011-06-04 ENCOUNTER — Ambulatory Visit: Payer: Medicare Other

## 2011-06-04 ENCOUNTER — Other Ambulatory Visit: Payer: Self-pay | Admitting: *Deleted

## 2011-06-04 ENCOUNTER — Ambulatory Visit (HOSPITAL_BASED_OUTPATIENT_CLINIC_OR_DEPARTMENT_OTHER): Payer: Medicare Other | Admitting: Hematology & Oncology

## 2011-06-04 VITALS — BP 149/80 | HR 89 | Temp 97.1°F | Ht 69.0 in | Wt 161.0 lb

## 2011-06-04 DIAGNOSIS — H1013 Acute atopic conjunctivitis, bilateral: Secondary | ICD-10-CM

## 2011-06-04 DIAGNOSIS — D509 Iron deficiency anemia, unspecified: Secondary | ICD-10-CM

## 2011-06-04 DIAGNOSIS — K922 Gastrointestinal hemorrhage, unspecified: Secondary | ICD-10-CM

## 2011-06-04 DIAGNOSIS — N189 Chronic kidney disease, unspecified: Secondary | ICD-10-CM

## 2011-06-04 LAB — IRON AND TIBC
%SAT: 13 % — ABNORMAL LOW (ref 20–55)
Iron: 39 ug/dL — ABNORMAL LOW (ref 42–165)
UIBC: 251 ug/dL (ref 125–400)

## 2011-06-04 LAB — CBC WITH DIFFERENTIAL (CANCER CENTER ONLY)
BASO#: 0 10*3/uL (ref 0.0–0.2)
EOS%: 2.6 % (ref 0.0–7.0)
Eosinophils Absolute: 0.1 10*3/uL (ref 0.0–0.5)
HGB: 11.1 g/dL — ABNORMAL LOW (ref 13.0–17.1)
LYMPH#: 0.9 10*3/uL (ref 0.9–3.3)
NEUT#: 2.9 10*3/uL (ref 1.5–6.5)
RBC: 4.02 10*6/uL — ABNORMAL LOW (ref 4.20–5.70)

## 2011-06-04 LAB — RETICULOCYTES (CHCC): Retic Ct Pct: 3.1 % — ABNORMAL HIGH (ref 0.4–2.3)

## 2011-06-04 MED ORDER — AZELASTINE HCL 0.05 % OP SOLN: OPHTHALMIC | Status: DC

## 2011-06-04 NOTE — Progress Notes (Signed)
This office note has been dictated.

## 2011-06-04 NOTE — Progress Notes (Signed)
CC:   [name], M.D.  DIAGNOSES: 1. Recurrent iron-deficiency anemia - gastrointestinal blood loss. 2. Anemia secondary to renal insufficiency. 3. Insulin-dependent diabetes.  CURRENT THERAPY: 1. Aranesp 300 mcg subcutaneously as needed for a hemoglobin less than     10. 2. IV iron as indicated.  INTERIM HISTORY:  Mr. Brum comes in for followup.  We did start him on Aranesp.  This has helped him out quite a bit.  He feels well.  He is still having issues with the GI bleeding.  He has seen Gastroenterology. He said he had an upper endoscopy and colonoscopy.  Both did not show any evidence of bleeding.  He then underwent a capsule endoscopy.  He does not have the results back from this.  We did give him iron with Feraheme back in late February.  He got 1,020 mg of Feraheme.  He feels well.  He does not feel as tired.  He has no "cravings."  He has had no problems with nausea or vomiting.  He has had no fever.  He has had no cough.  He has had no leg swelling.  PHYSICAL EXAMINATION:  General Appearance:  This is a well-developed, well-nourished, black gentleman in no obvious distress.  Vital Signs: Temperature 97.1.  Pulse 89.  Respiratory rate 16.  Blood pressure 148/80.  Weight is 161.  Head and Neck Exam:  A normocephalic, atraumatic skull.  There are no ocular or oral lesions.  There are no palpable cervical or supraclavicular lymph nodes.  Lungs:  Clear bilaterally.  Cardiac Exam:  Regular rate and rhythm with a normal S1 and S2.  There are no murmurs, rubs, or bruits.  Abdominal Exam:  Soft with good bowel sounds.  There is no fluid wave.  There is no palpable hepatosplenomegaly.  Back Exam:  No tenderness over the spine, ribs, or hips.  Extremities:  No clubbing, cyanosis, or edema.  Neurological Exam:  No focal neurological deficits.  Skin Exam:  No rash, ecchymosis, or petechia.  LABORATORY STUDIES:  White cell count is 4.5, hemoglobin 11.1, hematocrit 34.8,  platelet count is 373.  MCV is 87.  IMPRESSION:  Mr. Gapinski is a 66 year old gentleman with recurrent iron- deficiency anemia.  He has gastrointestinal bleeding.  One has to suspect that he has a either arteriovenous malformations or angiodysplasia within the small bowel.  Hopefully, the capsule endoscopy will show a source of blood loss.  I suspect that he will probably need iron in the future.  He does not need any Aranesp today.  We will plan to get him back to see [place] in another 6 weeks.    ______________________________ [name], M.D. PRE/MEDQ  D:  06/04/2011  T:  06/04/2011  Job:  [id number]

## 2011-06-04 NOTE — Progress Notes (Signed)
Addended by: Arlan Organ R on: 06/04/2011 02:58 PM   Modules accepted: Orders

## 2011-06-04 NOTE — Progress Notes (Signed)
[name] comes in today, CBC checked, [name] injection not given due to parameters.  Hgb was 11.1.  [name] made aware, seen by Dr. Myna Hidalgo. Teola Bradley, Lucetta Baehr [employer]

## 2011-06-13 ENCOUNTER — Other Ambulatory Visit: Payer: Self-pay

## 2011-06-25 ENCOUNTER — Ambulatory Visit: Payer: Medicare Other

## 2011-06-25 ENCOUNTER — Ambulatory Visit: Payer: Medicare Other | Admitting: Hematology & Oncology

## 2011-06-25 ENCOUNTER — Other Ambulatory Visit: Payer: Medicare Other | Admitting: Lab

## 2011-06-25 ENCOUNTER — Other Ambulatory Visit (HOSPITAL_BASED_OUTPATIENT_CLINIC_OR_DEPARTMENT_OTHER): Payer: Medicare Other | Admitting: Lab

## 2011-06-25 DIAGNOSIS — D509 Iron deficiency anemia, unspecified: Secondary | ICD-10-CM

## 2011-06-25 LAB — CBC WITH DIFFERENTIAL (CANCER CENTER ONLY)
BASO#: 0 10*3/uL (ref 0.0–0.2)
BASO%: 0.4 % (ref 0.0–2.0)
EOS%: 3.6 % (ref 0.0–7.0)
HGB: 11.1 g/dL — ABNORMAL LOW (ref 13.0–17.1)
LYMPH#: 1.5 10*3/uL (ref 0.9–3.3)
MCH: 27.4 pg — ABNORMAL LOW (ref 28.0–33.4)
MCHC: 33.3 g/dL (ref 32.0–35.9)
MONO%: 18 % — ABNORMAL HIGH (ref 0.0–13.0)
NEUT#: 2.7 10*3/uL (ref 1.5–6.5)
NEUT%: 50.3 % (ref 40.0–80.0)
RDW: 16.2 % — ABNORMAL HIGH (ref 11.1–15.7)

## 2011-06-25 MED ORDER — MECLIZINE HCL 25 MG PO TABS: 25.0000 mg | ORAL_TABLET | Freq: Three times a day (TID) | ORAL | Status: AC | PRN

## 2011-06-25 NOTE — Progress Notes (Signed)
Pt came in the office for an injection asking to speak Dr Myna Hidalgo regarding his dizziness. Aranesp was held today for hgb 11.1 (stable from last 3 CBC checks). Mr. Sciuto was seen by his PCP who told him that his dizziness was related to allergies. Dr. Myna Hidalgo ordered Antivert 25 mg q8h prn. Pt instructed not to drive after he takes his first dose as it may cause drowsiness. He verbalized understanding.

## 2011-06-25 NOTE — Patient Instructions (Signed)
Hydroxyzine capsules or tablets  What is this medicine?  HYDROXYZINE (hye DROX i zeen) is an antihistamine. This medicine is used to treat allergy symptoms. It is also used to treat anxiety and tension. This medicine can be used with other medicines to induce sleep before surgery.  This medicine may be used for other purposes; ask your health care provider or pharmacist if you have questions.  What should I tell my health care provider before I take this medicine? They need to know if you have any of these conditions: -any chronic illness -difficulty passing urine -glaucoma -heart disease -kidney disease -liver disease -lung disease -an unusual or allergic reaction to hydroxyzine, cetirizine, other medicines, foods, dyes, or preservatives -pregnant or trying to get pregnant -breast-feeding  How should I use this medicine?  Take this medicine by mouth with a full glass of water. Follow the directions on the prescription label. You may take this medicine with food or on an empty stomach. Take your medicine at regular intervals. Do not take your medicine more often than directed. Talk to your pediatrician regarding the use of this medicine in children. Special care may be needed. While this drug may be prescribed for children as young as 20 years of age for selected conditions, precautions do apply. Patients over 7 years old may have a stronger reaction and need a smaller dose. Overdosage: If you think you have taken too much of this medicine contact a poison control center or emergency room at once. NOTE: This medicine is only for you. Do not share this medicine with others.  What if I miss a dose?  If you miss a dose, take it as soon as you can. If it is almost time for your next dose, take only that dose. Do not take double or extra doses. What may interact with this medicine? -alcohol -barbiturate medicines for sleep or seizures -medicines for colds, allergies -medicines for  depression, anxiety, or emotional disturbances -medicines for pain -medicines for sleep -muscle relaxants This list may not describe all possible interactions. Give your health care provider a list of all the medicines, herbs, non-prescription drugs, or dietary supplements you use. Also tell them if you smoke, drink alcohol, or use illegal drugs. Some items may interact with your medicine. What should I watch for while using this medicine?  Tell your doctor or health care professional if your symptoms do not improve. You may get drowsy or dizzy. Do not drive, use machinery, or do anything that needs mental alertness until you know how this medicine affects you. Do not stand or sit up quickly, especially if you are an older patient. This reduces the risk of dizzy or fainting spells. Alcohol may interfere with the effect of this medicine. Avoid alcoholic drinks. Your mouth may get dry. Chewing sugarless gum or sucking hard candy, and drinking plenty of water may help. Contact your doctor if the problem does not go away or is severe. This medicine may cause dry eyes and blurred vision. If you wear contact lenses you may feel some discomfort. Lubricating drops may help. See your eye doctor if the problem does not go away or is severe. If you are receiving skin tests for allergies, tell your doctor you are using this medicine.  What side effects may I notice from receiving this medicine? Side effects that you should report to your doctor or health care professional as soon as possible: -fast or irregular heartbeat -difficulty passing urine -seizures -slurred speech or confusion -tremor  Side effects that usually do not require medical attention (report to your doctor or health care professional if they continue or are bothersome): -constipation -drowsiness -fatigue -headache -stomach upset This list may not describe all possible side effects. Call your doctor for medical advice about side  effects. You may report side effects to FDA at 1-800-FDA-1088.  Where should I keep my medicine? Keep out of the reach of children. Store at room temperature between 15 and 30 degrees C (59 and 86 degrees F). Keep container tightly closed. Throw away any unused medicine after the expiration date. NOTE: This sheet is a summary. It may not cover all possible information. If you have questions about this medicine, talk to your doctor, pharmacist, or health care provider.  2012, Elsevier/Gold Standard. (07/16/2007 2:50:59 PM)

## 2011-07-15 ENCOUNTER — Ambulatory Visit: Payer: Medicare Other

## 2011-07-15 ENCOUNTER — Encounter (HOSPITAL_BASED_OUTPATIENT_CLINIC_OR_DEPARTMENT_OTHER): Payer: Self-pay | Admitting: Emergency Medicine

## 2011-07-15 ENCOUNTER — Other Ambulatory Visit (HOSPITAL_BASED_OUTPATIENT_CLINIC_OR_DEPARTMENT_OTHER): Payer: Medicare Other | Admitting: Lab

## 2011-07-15 ENCOUNTER — Emergency Department (HOSPITAL_BASED_OUTPATIENT_CLINIC_OR_DEPARTMENT_OTHER)
Admission: EM | Admit: 2011-07-15 | Discharge: 2011-07-15 | Disposition: A | Discharge status: Home / Self Care | Payer: Medicare Other | Attending: Emergency Medicine | Admitting: Emergency Medicine

## 2011-07-15 ENCOUNTER — Ambulatory Visit (HOSPITAL_BASED_OUTPATIENT_CLINIC_OR_DEPARTMENT_OTHER): Payer: Medicare Other | Admitting: Hematology & Oncology

## 2011-07-15 ENCOUNTER — Emergency Department (INDEPENDENT_AMBULATORY_CARE_PROVIDER_SITE_OTHER): Payer: Medicare Other

## 2011-07-15 ENCOUNTER — Other Ambulatory Visit: Payer: Self-pay

## 2011-07-15 VITALS — BP 139/79 | HR 112 | Temp 97.1°F | Ht 69.0 in | Wt 163.0 lb

## 2011-07-15 DIAGNOSIS — D631 Anemia in chronic kidney disease: Secondary | ICD-10-CM

## 2011-07-15 DIAGNOSIS — Z79899 Other long term (current) drug therapy: Secondary | ICD-10-CM | POA: Insufficient documentation

## 2011-07-15 DIAGNOSIS — D649 Anemia, unspecified: Secondary | ICD-10-CM

## 2011-07-15 DIAGNOSIS — R0789 Other chest pain: Secondary | ICD-10-CM | POA: Insufficient documentation

## 2011-07-15 DIAGNOSIS — N289 Disorder of kidney and ureter, unspecified: Secondary | ICD-10-CM

## 2011-07-15 DIAGNOSIS — K219 Gastro-esophageal reflux disease without esophagitis: Secondary | ICD-10-CM | POA: Insufficient documentation

## 2011-07-15 DIAGNOSIS — R079 Chest pain, unspecified: Secondary | ICD-10-CM

## 2011-07-15 DIAGNOSIS — D509 Iron deficiency anemia, unspecified: Secondary | ICD-10-CM

## 2011-07-15 DIAGNOSIS — E119 Type 2 diabetes mellitus without complications: Secondary | ICD-10-CM | POA: Insufficient documentation

## 2011-07-15 DIAGNOSIS — I10 Essential (primary) hypertension: Secondary | ICD-10-CM | POA: Insufficient documentation

## 2011-07-15 HISTORY — DX: Essential (primary) hypertension: I10

## 2011-07-15 HISTORY — DX: Gastro-esophageal reflux disease without esophagitis: K21.9

## 2011-07-15 HISTORY — DX: Pure hypercholesterolemia, unspecified: E78.00

## 2011-07-15 LAB — COMPREHENSIVE METABOLIC PANEL
ALT: 12 U/L (ref 0–53)
Alkaline Phosphatase: 112 U/L (ref 39–117)
BUN: 6 mg/dL (ref 6–23)
CO2: 27 mEq/L (ref 19–32)
Calcium: 8.7 mg/dL (ref 8.4–10.5)
GFR calc Af Amer: >90 mL/min (ref >90)
GFR calc non Af Amer: 88 mL/min — ABNORMAL LOW (ref >90)
Glucose, Bld: 233 mg/dL — ABNORMAL HIGH (ref 70–99)
Potassium: 3.6 mEq/L (ref 3.5–5.1)
Sodium: 139 mEq/L (ref 135–145)

## 2011-07-15 LAB — CBC WITH DIFFERENTIAL (CANCER CENTER ONLY)
BASO%: 0.4 % (ref 0.0–2.0)
LYMPH%: 26.1 % (ref 14.0–48.0)
MCH: 26.5 pg — ABNORMAL LOW (ref 28.0–33.4)
MCV: 81 fL — ABNORMAL LOW (ref 82–98)
MONO%: 14.1 % — ABNORMAL HIGH (ref 0.0–13.0)
NEUT#: 2.8 10*3/uL (ref 1.5–6.5)
Platelets: 342 10*3/uL (ref 145–400)
RBC: 3.51 10*6/uL — ABNORMAL LOW (ref 4.20–5.70)
RDW: 15.5 % (ref 11.1–15.7)
WBC: 5.1 10*3/uL (ref 4.0–10.0)

## 2011-07-15 LAB — IRON AND TIBC
Iron: 12 ug/dL — ABNORMAL LOW (ref 42–165)
UIBC: 291 ug/dL (ref 125–400)

## 2011-07-15 MED ORDER — KETOROLAC TROMETHAMINE 60 MG/2ML IM SOLN
60.0000 mg | Freq: Once | INTRAMUSCULAR | Status: AC
  Administered 2011-07-15: 60 mg via INTRAMUSCULAR
  Filled 2011-07-15: qty 2

## 2011-07-15 NOTE — ED Notes (Addendum)
Pt c/o mid CP x 2 days, constant dull ache; denies other sx, nothing decreases pain; pain increases w/ cough

## 2011-07-15 NOTE — ED Provider Notes (Signed)
History     CSN: [id number]  Arrival date & time 07/15/11  1039   First MD Initiated Contact with Patient 07/15/11 1140      Chief Complaint  Patient presents with  . Chest Pain    (Consider location/radiation/quality/duration/timing/severity/associated sxs/prior treatment) HPI Comments: Patient coughed 2 days ago and then started with sharp pain in the center of his chest.  It has been constant since, worse when he breathes, coughs, or pushes on the area.  No exertional component.  Patient is a 66 y.o. male presenting with chest pain. The history is provided by the patient.  Chest Pain     Past Medical History  Diagnosis Date  . Anemia, iron deficiency 03/24/2011  . 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 Hypertension   . Diabetes mellitus   . High cholesterol   . GERD (gastroesophageal reflux disease)   . Neuropathy     Past Surgical History  Procedure Date  . Back surgery   . Rotator cuff repair     No family history on file.  History  Substance Use Topics  . Smoking status: Current Everyday Smoker  . Smokeless tobacco: Not on file  . Alcohol Use: No      Review of Systems  Cardiovascular: Positive for chest pain.  All other systems reviewed and are negative.    Allergies  Review of patient's allergies indicates no known allergies.  Home Medications   Current Outpatient Rx  Name Route Sig Dispense Refill  . ASPIRIN 81 MG PO TABS Oral Take 81 mg by mouth daily.    . ATORVASTATIN CALCIUM 80 MG PO TABS Oral Take 80 mg by mouth Daily.    . AZELASTINE HCL 0.05 % OP SOLN  Use 2 gtt into both eyes bid x 7 days 6 mL 12  . GABAPENTIN 100 MG PO CAPS  every morning.    [name] Kitchen LOSARTAN POTASSIUM 25 MG PO TABS Oral Take 25 mg by mouth Daily.    [name] Kitchen MECLIZINE HCL 25 MG PO TABS  every morning.    [name] Kitchen METFORMIN HCL 500 MG PO TABS Oral Take 500 mg by mouth Daily.    [name] Kitchen METOCLOPRAMIDE HCL 10 MG PO TABS Oral Take 10 mg by mouth 4 (four) times daily.    [name] Kitchen NEXIUM 40 MG PO CPDR  Oral Take 40 mg by mouth Daily.      BP 130/73  Pulse 102  Temp(Src) 97.9 F (36.6 C) (Oral)  Resp 20  Ht 5\' 9"  (1.753 m)  Wt 163 lb (73.936 kg)  BMI 24.07 kg/m2  SpO2 100%  Physical Exam  Nursing note and vitals reviewed. Constitutional: He is oriented to person, place, and time. He appears well-developed and well-nourished. No distress.  HENT:  Head: Normocephalic and atraumatic.  Neck: Normal range of motion. Neck supple.  Cardiovascular: Normal rate and regular rhythm.   No murmur heard. Pulmonary/Chest: Effort normal and breath sounds normal. No respiratory distress.  Abdominal: Soft. Bowel sounds are normal. He exhibits no distension. There is no tenderness.  Musculoskeletal: He exhibits no edema.       There is ttp in the center of the sternum.  This reproduces his symptoms.  Neurological: He is alert and oriented to person, place, and time.  Skin: Skin is warm and dry. He is not diaphoretic.    ED Course  Procedures (including critical care time)   Labs Reviewed  DIFFERENTIAL  COMPREHENSIVE METABOLIC PANEL  CARDIAC PANEL(CRET KIN+CKTOT+MB+TROPI)   No results  found.   No diagnosis found.   Date: 07/15/2011  Rate: 83  Rhythm: normal sinus rhythm  QRS Axis: normal  Intervals: normal  ST/T Wave abnormalities: normal  Conduction Disutrbances:none  Narrative Interpretation:   Old EKG Reviewed: unchanged    MDM  The patient's symptoms began with coughing.  They are exacerbated with deep breath, cough and palpation and have been going on for 2 days with negative ekg and cardiac enzymes.  He will be discharged to home.  To follow up with pcp and return prn.        Geoffery Lyons, MD 07/15/11 1240

## 2011-07-15 NOTE — Discharge Instructions (Signed)
Chest Pain (Nonspecific) It is often hard to give a specific diagnosis for the cause of chest pain. There is always a chance that your pain could be related to something serious, such as a heart attack or a blood clot in the lungs. You need to follow up with your caregiver for further evaluation. CAUSES   Heartburn.   Pneumonia or bronchitis.   Anxiety or stress.   Inflammation around your heart (pericarditis) or lung (pleuritis or pleurisy).   A blood clot in the lung.   A collapsed lung (pneumothorax). It can develop suddenly on its own (spontaneous pneumothorax) or from injury (trauma) to the chest.   Shingles infection (herpes zoster virus).  The chest wall is composed of bones, muscles, and cartilage. Any of these can be the source of the pain.  The bones can be bruised by injury.   The muscles or cartilage can be strained by coughing or overwork.   The cartilage can be affected by inflammation and become sore (costochondritis).  DIAGNOSIS  Lab tests or other studies, such as X-rays, electrocardiography, stress testing, or cardiac imaging, may be needed to find the cause of your pain.  TREATMENT   Treatment depends on what may be causing your chest pain. Treatment may include:   Acid blockers for heartburn.   Anti-inflammatory medicine.   Pain medicine for inflammatory conditions.   Antibiotics if an infection is present.   You may be advised to change lifestyle habits. This includes stopping smoking and avoiding alcohol, caffeine, and chocolate.   You may be advised to keep your head raised (elevated) when sleeping. This reduces the chance of acid going backward from your stomach into your esophagus.   Most of the time, nonspecific chest pain will improve within 2 to 3 days with rest and mild pain medicine.  HOME CARE INSTRUCTIONS   If antibiotics were prescribed, take your antibiotics as directed. Finish them even if you start to feel better.   For the next few  days, avoid physical activities that bring on chest pain. Continue physical activities as directed.   Do not smoke.   Avoid drinking alcohol.   Only take over-the-counter or prescription medicine for pain, discomfort, or fever as directed by your caregiver.   Follow your caregiver's suggestions for further testing if your chest pain does not go away.   Keep any follow-up appointments you made. If you do not go to an appointment, you could develop lasting (chronic) problems with pain. If there is any problem keeping an appointment, you must call to reschedule.  SEEK MEDICAL CARE IF:   You think you are having problems from the medicine you are taking. Read your medicine instructions carefully.   Your chest pain does not go away, even after treatment.   You develop a rash with blisters on your chest.  SEEK IMMEDIATE MEDICAL CARE IF:   You have increased chest pain or pain that spreads to your arm, neck, jaw, back, or abdomen.   You develop shortness of breath, an increasing cough, or you are coughing up blood.   You have severe back or abdominal pain, feel nauseous, or vomit.   You develop severe weakness, fainting, or chills.   You have a fever.  THIS IS AN EMERGENCY. Do not wait to see if the pain will go away. Get medical help at once. Call your local emergency services (911 in [place] Do not drive yourself to the hospital. MAKE SURE YOU:   Understand these instructions.     Will watch your condition.   Will get help right away if you are not doing well or get worse.  Document Released: 12/11/2004 Document Revised: 02/20/2011 Document Reviewed: 10/07/2007 Exit[employer] Patient Information 2012 [employer]. 

## 2011-07-15 NOTE — Progress Notes (Signed)
Pt did not receive Aranesp today after seeing Dr. Myna Hidalgo, d/t pt c/o chest pain, EKG done, and pt immediately sent to ER in this building. Will f/u for need of Aranesp next treatment/.

## 2011-07-17 ENCOUNTER — Telehealth: Payer: Self-pay | Admitting: *Deleted

## 2011-07-17 ENCOUNTER — Telehealth: Payer: Self-pay | Admitting: Hematology & Oncology

## 2011-07-17 ENCOUNTER — Other Ambulatory Visit: Payer: Self-pay | Admitting: *Deleted

## 2011-07-17 DIAGNOSIS — D509 Iron deficiency anemia, unspecified: Secondary | ICD-10-CM

## 2011-07-17 MED ORDER — SODIUM CHLORIDE 0.9 % IV SOLN: 1020.0000 mg | Freq: Once | INTRAVENOUS | Status: DC

## 2011-07-17 NOTE — Telephone Encounter (Signed)
Message copied by Anselm Jungling on Thu Jul 17, 2011  9:46 AM ------      Message from: Arlan Organ R      Created: Wed Jul 16, 2011 10:05 PM       Call - iron is low.  Need feraheme 1020mg  in 1-2 weeks,.  [name]

## 2011-07-17 NOTE — Telephone Encounter (Signed)
Called patient on personal cell phone to let him know that his iron levels were low.  Told patient to call [facility] and schedule Iron appt in next few weeks

## 2011-07-17 NOTE — Telephone Encounter (Signed)
Pt aware of 5-13 iron infusion

## 2011-07-22 NOTE — Progress Notes (Signed)
This office note has been dictated.

## 2011-07-23 ENCOUNTER — Ambulatory Visit: Payer: Medicare Other

## 2011-07-23 ENCOUNTER — Ambulatory Visit: Payer: Medicare Other | Admitting: Hematology & Oncology

## 2011-07-23 ENCOUNTER — Other Ambulatory Visit: Payer: Medicare Other | Admitting: Lab

## 2011-07-24 ENCOUNTER — Encounter: Payer: Self-pay | Admitting: *Deleted

## 2011-07-24 NOTE — Progress Notes (Signed)
Received call from Dr. Vernell Barrier office in [facility] wanting Dr. Lupita Leash to be aware that Patient was there in office and Hgb was 8.3.  Patient to come in here on Monday 07/28/11 for iron infusion.

## 2011-07-25 ENCOUNTER — Encounter: Payer: Self-pay | Admitting: Hematology & Oncology

## 2011-07-28 ENCOUNTER — Ambulatory Visit (HOSPITAL_BASED_OUTPATIENT_CLINIC_OR_DEPARTMENT_OTHER): Payer: Medicare Other

## 2011-07-28 VITALS — BP 148/82 | HR 88 | Temp 96.7°F

## 2011-07-28 DIAGNOSIS — D631 Anemia in chronic kidney disease: Secondary | ICD-10-CM

## 2011-07-28 DIAGNOSIS — N189 Chronic kidney disease, unspecified: Secondary | ICD-10-CM

## 2011-07-28 DIAGNOSIS — D649 Anemia, unspecified: Secondary | ICD-10-CM

## 2011-07-28 DIAGNOSIS — D509 Iron deficiency anemia, unspecified: Secondary | ICD-10-CM

## 2011-07-28 DIAGNOSIS — N289 Disorder of kidney and ureter, unspecified: Secondary | ICD-10-CM

## 2011-07-28 MED ORDER — SODIUM CHLORIDE 0.9 % IV SOLN
1020.0000 mg | Freq: Once | INTRAVENOUS | Status: AC
  Administered 2011-07-28: 1020 mg via INTRAVENOUS
  Filled 2011-07-28: qty 34

## 2011-07-28 NOTE — Patient Instructions (Signed)
Ferumoxytol injection What is this medicine? FERUMOXYTOL is an iron complex. Iron is used to make healthy red blood cells, which carry oxygen and nutrients throughout the body. This medicine is used to treat iron deficiency anemia in people with chronic kidney disease. This medicine may be used for other purposes; ask your health care provider or pharmacist if you have questions. What should I tell my health care provider before I take this medicine? They need to know if you have any of these conditions: -anemia not caused by low iron levels -high levels of iron in the blood -magnetic resonance imaging (MRI) test scheduled -an unusual or allergic reaction to iron, other medicines, foods, dyes, or preservatives -pregnant or trying to get pregnant -breast-feeding How should I use this medicine? This medicine is for infusion into a vein. It is given by a health care professional in a hospital or clinic setting. Talk to your pediatrician regarding the use of this medicine in children. Special care may be needed. Overdosage: If you think you've taken too much of this medicine contact a poison control center or emergency room at once. Overdosage: If you think you have taken too much of this medicine contact a poison control center or emergency room at once. NOTE: This medicine is only for you. Do not share this medicine with others. What if I miss a dose? It is important not to miss your dose. Call your doctor or health care professional if you are unable to keep an appointment. What may interact with this medicine? This medicine may interact with the following medications: -other iron products This list may not describe all possible interactions. Give your health care provider a list of all the medicines, herbs, non-prescription drugs, or dietary supplements you use. Also tell them if you smoke, drink alcohol, or use illegal drugs. Some items may interact with your medicine. What should I watch  for while using this medicine? Visit your doctor or healthcare professional regularly. Tell your doctor or healthcare professional if your symptoms do not start to get better or if they get worse. You may need blood work done while you are taking this medicine. You may need to follow a special diet. Talk to your doctor. Foods that contain iron include: whole grains/cereals, dried fruits, beans, or peas, leafy green vegetables, and organ meats (liver, kidney). What side effects may I notice from receiving this medicine? Side effects that you should report to your doctor or health care professional as soon as possible: -allergic reactions like skin rash, itching or hives, swelling of the face, lips, or tongue -breathing problems -changes in blood pressure -feeling faint or lightheaded, falls -fever or chills -flushing, sweating, or hot feelings -swelling of the ankles or feet Side effects that usually do not require medical attention (Report these to your doctor or health care professional if they continue or are bothersome.): -diarrhea -headache -nausea, vomiting -stomach pain This list may not describe all possible side effects. Call your doctor for medical advice about side effects. You may report side effects to FDA at 1-800-FDA-1088. Where should I keep my medicine? This drug is given in a hospital or clinic and will not be stored at home. NOTE: This sheet is a summary. It may not cover all possible information. If you have questions about this medicine, talk to your doctor, pharmacist, or health care provider.  2012, Elsevier/Gold Standard. (11/24/2007 9:48:25 PM) 

## 2011-07-30 ENCOUNTER — Emergency Department (HOSPITAL_BASED_OUTPATIENT_CLINIC_OR_DEPARTMENT_OTHER)
Admission: EM | Admit: 2011-07-30 | Discharge: 2011-07-30 | Disposition: A | Discharge status: Home / Self Care | Payer: Medicare Other | Attending: Emergency Medicine | Admitting: Emergency Medicine

## 2011-07-30 ENCOUNTER — Encounter (HOSPITAL_BASED_OUTPATIENT_CLINIC_OR_DEPARTMENT_OTHER): Payer: Self-pay | Admitting: *Deleted

## 2011-07-30 ENCOUNTER — Emergency Department (HOSPITAL_BASED_OUTPATIENT_CLINIC_OR_DEPARTMENT_OTHER): Payer: Medicare Other

## 2011-07-30 DIAGNOSIS — D649 Anemia, unspecified: Secondary | ICD-10-CM | POA: Insufficient documentation

## 2011-07-30 DIAGNOSIS — R0602 Shortness of breath: Secondary | ICD-10-CM | POA: Insufficient documentation

## 2011-07-30 DIAGNOSIS — R42 Dizziness and giddiness: Secondary | ICD-10-CM | POA: Insufficient documentation

## 2011-07-30 DIAGNOSIS — E860 Dehydration: Secondary | ICD-10-CM | POA: Insufficient documentation

## 2011-07-30 LAB — DIFFERENTIAL
Basophils Absolute: 0 10*3/uL (ref 0.0–0.1)
Basophils Relative: 0 % (ref 0–1)
Lymphocytes Relative: 17 % (ref 12–46)
Monocytes Relative: 15 % — ABNORMAL HIGH (ref 3–12)
Neutro Abs: 5.3 10*3/uL (ref 1.7–7.7)
Neutrophils Relative %: 66 % (ref 43–77)

## 2011-07-30 LAB — BASIC METABOLIC PANEL
BUN: 9 mg/dL (ref 6–23)
Chloride: 100 mEq/L (ref 96–112)
GFR calc Af Amer: 60 mL/min — ABNORMAL LOW (ref >90)
Potassium: 3.3 mEq/L — ABNORMAL LOW (ref 3.5–5.1)

## 2011-07-30 LAB — CBC
Hemoglobin: 8.5 g/dL — ABNORMAL LOW (ref 13.0–17.0)
MCHC: 32.6 g/dL (ref 30.0–36.0)
WBC: 7.9 10*3/uL (ref 4.0–10.5)

## 2011-07-30 LAB — URINE MICROSCOPIC-ADD ON

## 2011-07-30 LAB — URINALYSIS, ROUTINE W REFLEX MICROSCOPIC
Leukocytes, UA: NEGATIVE
Nitrite: NEGATIVE
Specific Gravity, Urine: 1.029 (ref 1.005–1.030)
pH: 5.5 (ref 5.0–8.0)

## 2011-07-30 MED ORDER — SODIUM CHLORIDE 0.9 % IV BOLUS (SEPSIS)
500.0000 mL | Freq: Once | INTRAVENOUS | Status: AC
  Administered 2011-07-30: 500 mL via INTRAVENOUS

## 2011-07-30 NOTE — ED Provider Notes (Signed)
Nursing notes and vitals signs, including pulse oximetry, reviewed.  Summary of this visit's results, reviewed by myself:  Labs:  Results for orders placed during the hospital encounter of 07/30/11  CBC      Component Value Range   WBC 7.9  4.0 - 10.5 (K/uL)   RBC 3.31 (*) 4.22 - 5.81 (MIL/uL)   Hemoglobin 8.5 (*) 13.0 - 17.0 (g/dL)   HCT 19.1 (*) 47.8 - 52.0 (%)   MCV 78.9  78.0 - 100.0 (fL)   MCH 25.7 (*) 26.0 - 34.0 (pg)   MCHC 32.6  30.0 - 36.0 (g/dL)   RDW 29.5 (*) 62.1 - 15.5 (%)   Platelets 399  150 - 400 (K/uL)  DIFFERENTIAL      Component Value Range   Neutrophils Relative 66  43 - 77 (%)   Neutro Abs 5.3  1.7 - 7.7 (K/uL)   Lymphocytes Relative 17  12 - 46 (%)   Lymphs Abs 1.4  0.7 - 4.0 (K/uL)   Monocytes Relative 15 (*) 3 - 12 (%)   Monocytes Absolute 1.2 (*) 0.1 - 1.0 (K/uL)   Eosinophils Relative 1  0 - 5 (%)   Eosinophils Absolute 0.1  0.0 - 0.7 (K/uL)   Basophils Relative 0  0 - 1 (%)   Basophils Absolute 0.0  0.0 - 0.1 (K/uL)  BASIC METABOLIC PANEL      Component Value Range   Sodium 136  135 - 145 (mEq/L)   Potassium 3.3 (*) 3.5 - 5.1 (mEq/L)   Chloride 100  96 - 112 (mEq/L)   CO2 26  19 - 32 (mEq/L)   Glucose, Bld 321 (*) 70 - 99 (mg/dL)   BUN 9  6 - 23 (mg/dL)   Creatinine, Ser 3.08 (*) 0.50 - 1.35 (mg/dL)   Calcium 9.5  8.4 - 65.7 (mg/dL)   GFR calc non Af Amer 52 (*) >90 (mL/min)   GFR calc Af Amer 60 (*) >90 (mL/min)  URINALYSIS, ROUTINE W REFLEX MICROSCOPIC      Component Value Range   Color, Urine AMBER (*) YELLOW    APPearance CLOUDY (*) CLEAR    Specific Gravity, Urine 1.029  1.005 - 1.030    pH 5.5  5.0 - 8.0    Glucose, UA >1000 (*) NEGATIVE (mg/dL)   Hgb urine dipstick SMALL (*) NEGATIVE    Bilirubin Urine SMALL (*) NEGATIVE    Ketones, ur 15 (*) NEGATIVE (mg/dL)   Protein, ur >846 (*) NEGATIVE (mg/dL)   Urobilinogen, UA 0.2  0.0 - 1.0 (mg/dL)   Nitrite NEGATIVE  NEGATIVE    Leukocytes, UA NEGATIVE  NEGATIVE   TROPONIN I   Component Value Range   Troponin I <0.30  <0.30 (ng/mL)  URINE MICROSCOPIC-ADD ON      Component Value Range   Squamous Epithelial / LPF FEW (*) RARE    RBC / HPF 3-6  <3 (RBC/hpf)   Bacteria, UA RARE  RARE    Casts HYALINE CASTS (*) NEGATIVE     Imaging Studies: Dg Chest 2 View  07/30/2011  *RADIOLOGY REPORT*  Clinical Data: Intermittent dizziness.  Mid chest pain.  CHEST - 2 VIEW  Comparison: Portable chest 07/15/2011.  CT chest 12/10/2010.  Findings: The heart size is normal.  The lungs are clear.  The degenerative changes within the thoracic spine are stable. Degenerative changes within the thoracic spine are stable.  IMPRESSION:  1.  No acute cardiopulmonary disease or significant interval change.  Original Report  Authenticated By: Jamesetta Orleans. MATTERN, M.D.   9:14 PM Feels better after 1 L fluid bolus. Suspect part of his symptomatology is due to dehydration as his sugar is elevated. He was advised of his hyperglycemia and that he should followup with his physician regarding better glycemic control.  Hanley Seamen, MD 07/30/11 2115

## 2011-07-30 NOTE — ED Notes (Signed)
Pt c/o SOB and fatigue x 2 months, worse in the last 3 days

## 2011-07-30 NOTE — ED Notes (Signed)
MD at bedside. 

## 2011-07-30 NOTE — ED Provider Notes (Signed)
History     CSN: [id number]  Arrival date & time 07/30/11  1805   First MD Initiated Contact with Patient 07/30/11 1810      Chief Complaint  Patient presents with  . Shortness of Breath  . Fatigue    (Consider location/radiation/quality/duration/timing/severity/associated sxs/prior treatment) HPI Pt with history of chronic kidney disease and anemia reports he has had exercise intolerance and dyspnea, particularly with exertion, for the last several months but worse in the last few days. He also reports some dizziness today, described as a lightheadedness when he stood up after sitting for a long period. Denies any CP but has had some occasional non-productive cough. No fever. No nausea or vomiting, but had two loose stools last night. He is followed by Dr. Myna Hidalgo for his anemia and had a IV iron infusion done two days ago for a Hgb of 8.3 found at his PCP office last week.   Past Medical History  Diagnosis Date  . Anemia, iron deficiency 03/24/2011  . 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 Hypertension   . Diabetes mellitus   . High cholesterol   . GERD (gastroesophageal reflux disease)   . Neuropathy     Past Surgical History  Procedure Date  . Back surgery   . Rotator cuff repair     History reviewed. No pertinent family history.  History  Substance Use Topics  . Smoking status: Current Everyday Smoker  . Smokeless tobacco: Not on file  . Alcohol Use: No      Review of Systems All other systems reviewed and are negative except as noted in HPI.   Allergies  Review of patient's allergies indicates no known allergies.  Home Medications   Current Outpatient Rx  Name Route Sig Dispense Refill  . AZELASTINE HCL 0.05 % OP SOLN  Use 2 gtt into both eyes bid x 7 days 6 mL 12  . LOSARTAN POTASSIUM 25 MG PO TABS Oral Take 25 mg by mouth Daily.    [name] Kitchen MECLIZINE HCL 25 MG PO TABS  every morning.    [name] Kitchen METFORMIN HCL 500 MG PO TABS Oral Take 500 mg by mouth Daily.    [name] Kitchen  METOCLOPRAMIDE HCL 10 MG PO TABS Oral Take 10 mg by mouth 4 (four) times daily.    [name] Kitchen NEXIUM 40 MG PO CPDR Oral Take 40 mg by mouth Daily.      BP 125/70  Pulse 110  Temp(Src) 97.7 F (36.5 C) (Oral)  Resp 16  Ht 5\' 9"  (1.753 m)  Wt 163 lb (73.936 kg)  BMI 24.07 kg/m2  SpO2 100%  Physical Exam  Nursing note and vitals reviewed. Constitutional: He is oriented to person, place, and time. He appears well-developed and well-nourished.  HENT:  Head: Normocephalic and atraumatic.  Eyes: EOM are normal. Pupils are equal, round, and reactive to light.  Neck: Normal range of motion. Neck supple.  Cardiovascular: Normal rate, normal heart sounds and intact distal pulses.   Pulmonary/Chest: Effort normal and breath sounds normal.  Abdominal: Bowel sounds are normal. He exhibits no distension. There is no tenderness.  Musculoskeletal: Normal range of motion. He exhibits no edema and no tenderness.  Neurological: He is alert and oriented to person, place, and time. He has normal strength. No cranial nerve deficit or sensory deficit.  Skin: Skin is warm and dry. No rash noted.  Psychiatric: He has a normal mood and affect.    ED Course  Procedures (including critical care time)  Labs  Reviewed  CBC - Abnormal; Notable for the following:    RBC 3.31 (*)    Hemoglobin 8.5 (*)    HCT 26.1 (*)    MCH 25.7 (*)    RDW 16.3 (*)    All other components within normal limits  DIFFERENTIAL - Abnormal; Notable for the following:    Monocytes Relative 15 (*)    Monocytes Absolute 1.2 (*)    All other components within normal limits  BASIC METABOLIC PANEL - Abnormal; Notable for the following:    Potassium 3.3 (*)    Glucose, Bld 321 (*)    Creatinine, Ser 1.40 (*)    GFR calc non Af Amer 52 (*)    GFR calc Af Amer 60 (*)    All other components within normal limits  URINALYSIS, ROUTINE W REFLEX MICROSCOPIC - Abnormal; Notable for the following:    Color, Urine AMBER (*) BIOCHEMICALS MAY BE  AFFECTED BY COLOR   APPearance CLOUDY (*)    Glucose, UA >1000 (*)    Hgb urine dipstick SMALL (*)    Bilirubin Urine SMALL (*)    Ketones, ur 15 (*)    Protein, ur >300 (*)    All other components within normal limits  URINE MICROSCOPIC-ADD ON - Abnormal; Notable for the following:    Squamous Epithelial / LPF FEW (*)    Casts HYALINE CASTS (*)    All other components within normal limits  TROPONIN I   No results found.   No diagnosis found.    MDM   Date: 07/30/2011  Rate: 93  Rhythm: normal sinus rhythm  QRS Axis: normal  Intervals: normal  ST/T Wave abnormalities: normal  Conduction Disutrbances:none  Narrative Interpretation:   Old EKG Reviewed: unchanged         [name] B. Bernette Mayers, MD 08/04/11 [phone]

## 2011-07-30 NOTE — ED Notes (Signed)
Pt ambulated without assist and with a steady gait. Pt denies dizziness at this time, MD made aware.

## 2011-10-16 ENCOUNTER — Other Ambulatory Visit: Payer: Self-pay | Admitting: Hematology & Oncology

## 2011-10-16 DIAGNOSIS — D509 Iron deficiency anemia, unspecified: Secondary | ICD-10-CM

## 2011-10-16 DIAGNOSIS — D631 Anemia in chronic kidney disease: Secondary | ICD-10-CM

## 2011-10-17 ENCOUNTER — Telehealth: Payer: Self-pay | Admitting: Hematology & Oncology

## 2011-10-17 NOTE — Telephone Encounter (Signed)
Per order to sch appt for 10/21/11 appt.  Appt was sch, patient was called and given appt date/time.  Patient is aware and states he will be here.

## 2011-10-17 NOTE — Progress Notes (Signed)
DIAGNOSES: 1. Recurrent iron deficiency anemia. 2. 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 3. Anemia secondary to renal insufficiency.  CURRENT THERAPY: 1. IV iron as indicated. 2. Aranesp 300 mcg subcu as needed for hemoglobin less than 10.  INTERIM HISTORY:  Mr. Vanaman comes in for followup.  He seems to be doing fairly well.  He has had no obvious bleeding.  He has had no nausea or vomiting.  He says his diabetes is doing better and under better control.  He has had no cough.  He has had no nausea or vomiting.  His last iron was back in February.  His last Aranesp was also back in February.  PHYSICAL EXAMINATION:  General: This is a fairly well-developed, well- nourished, African American gentleman in no obvious distress.  Vital signs:  Temperature of 97.9, pulse 102, respiratory rate 18, blood pressure 130/73, weight 163.  Head and neck:  Normocephalic, atraumatic skull.  There are no ocular or oral lesions.  There are no palpable cervical or supraclavicular lymph nodes.  Lungs:  Clear bilaterally. Cardiac:  Regular rate and rhythm with a normal S1, S2.  There are no murmurs, rubs or bruits.  Abdomen:  Soft with good bowel sounds.  There is no palpable abdominal mass.  There is no palpable hepatosplenomegaly. Extremities:  Shows no clubbing, cyanosis or edema.  Skin:  No rashes, ecchymosis or petechia.  LABORATORY STUDIES:  White cell count 5.1, hemoglobin 9.3, hematocrit 28.4, platelet count 342.  Her ferritin is 13 with an iron saturation of 4%.  BUN is 6, creatinine 0.9.  IMPRESSION:  Mr. Domine is a 66 year old, African American gentleman with anemia of renal insufficiency and iron-deficiency anemia.  Again, he is iron deficient.  His blood sugars still are just not that well controlled.  We will go ahead and give him a dose of IV iron.  This typically does help him out.  We will then plan to get him back to see [place] in another couple  months.   ______________________________ [name], M.D. PRE/MEDQ  D:  10/16/2011  T:  10/17/2011  Job:  [id number]

## 2011-10-20 ENCOUNTER — Telehealth: Payer: Self-pay | Admitting: *Deleted

## 2011-10-20 NOTE — Telephone Encounter (Signed)
Pt wife left a voicemail stating she needed to speak with a nurse regarding some issues [name] was having. Thinks he needs an appt to come in. Returned her call at 1300, no answer. Left message stating he has an appt scheduled for tomorrow but to please call back if she thinks he needs to be seen before then.

## 2011-10-21 ENCOUNTER — Other Ambulatory Visit: Payer: Medicare Other | Admitting: Lab

## 2011-10-21 ENCOUNTER — Other Ambulatory Visit (HOSPITAL_BASED_OUTPATIENT_CLINIC_OR_DEPARTMENT_OTHER): Payer: Medicare Other | Admitting: Lab

## 2011-10-21 ENCOUNTER — Ambulatory Visit (HOSPITAL_BASED_OUTPATIENT_CLINIC_OR_DEPARTMENT_OTHER): Payer: Medicare Other | Admitting: Medical

## 2011-10-21 ENCOUNTER — Ambulatory Visit (HOSPITAL_BASED_OUTPATIENT_CLINIC_OR_DEPARTMENT_OTHER): Payer: Medicare Other

## 2011-10-21 VITALS — BP 130/73 | HR 116 | Temp 97.0°F | Resp 20 | Ht 69.0 in | Wt 161.0 lb

## 2011-10-21 DIAGNOSIS — D638 Anemia in other chronic diseases classified elsewhere: Secondary | ICD-10-CM

## 2011-10-21 DIAGNOSIS — E119 Type 2 diabetes mellitus without complications: Secondary | ICD-10-CM

## 2011-10-21 DIAGNOSIS — N289 Disorder of kidney and ureter, unspecified: Secondary | ICD-10-CM

## 2011-10-21 DIAGNOSIS — D509 Iron deficiency anemia, unspecified: Secondary | ICD-10-CM

## 2011-10-21 DIAGNOSIS — D631 Anemia in chronic kidney disease: Secondary | ICD-10-CM

## 2011-10-21 LAB — IRON AND TIBC: Iron: 39 ug/dL — ABNORMAL LOW (ref 42–165)

## 2011-10-21 LAB — FERRITIN: Ferritin: 8 ng/mL — ABNORMAL LOW (ref 22–322)

## 2011-10-21 LAB — CBC WITH DIFFERENTIAL (CANCER CENTER ONLY)
BASO%: 0.4 % (ref 0.0–2.0)
LYMPH%: 19.2 % (ref 14.0–48.0)
MCH: 27.3 pg — ABNORMAL LOW (ref 28.0–33.4)
MCV: 85 fL (ref 82–98)
MONO%: 12.6 % (ref 0.0–13.0)
Platelets: 481 10*3/uL — ABNORMAL HIGH (ref 145–400)
RDW: 14.1 % (ref 11.1–15.7)

## 2011-10-21 MED ORDER — FERUMOXYTOL INJECTION 510 MG/17 ML
1020.0000 mg | Freq: Once | INTRAVENOUS | Status: AC
  Administered 2011-10-21: 1020 mg via INTRAVENOUS
  Filled 2011-10-21: qty 34

## 2011-10-21 MED ORDER — DARBEPOETIN ALFA-POLYSORBATE 300 MCG/0.6ML IJ SOLN
300.0000 ug | Freq: Once | INTRAMUSCULAR | Status: AC
  Administered 2011-10-21: 300 ug via SUBCUTANEOUS

## 2011-10-21 NOTE — Progress Notes (Signed)
Patient Name : [name], [name] MR #[id number] DOB: 1946-01-08 Encounter Date: 10/21/2011 Dictated by Eunice Blase, PA-C  Diagnoses: #1 recurrent iron deficiency anemia.  #2 insulin dependent diabetes, marginal control at best. #3 anemia, secondary to renal insufficiency.  Current therapy: #1 IV iron as indicated.  Last IV iron with Feraheme was may, 2013. #2 Aranesp 300 mcg subcutaneous as needed.  For hemoglobin less than 10.  Interim history: [name] comes in today for work in.  [name] reports, that he has been feeling quite tired and that his legs feel weak.  His last dose of IV iron with Feraheme was back in may.  It looks like his last Aranesp injection was back in February.  He is supposed to be following up with blood work every 3 weeks and an Aranesp injection for hemoglobin of less than 10.  His diabetes is still at marginal control at best.  He is not reporting any obvious, bleeding.  He's not having any nausea, vomiting, diarrhea, constipation.  He denies any cough, chest pain, or shortness of breath.  He has a decent appetite.  He denies any fevers, chills, or night sweats.  He denies any headaches or blurry vision.  His hemoglobin today is 8.5, hematocrit 26.1, and platelets are 399,000. He has no other issues.    Review of Systems: Fatigue and weakness, otherwise: Pt. Denies any changes in their vision, hearing, adenopathy, fevers, chills, nausea, vomiting, diarrhea, constipation, chest pain, shortness of breath, passing blood, passing out, blacking out,  any changes in skin, joints, neurologic or psychiatric except as noted.  Physical Exam: This is a thin-appearing 66 year old, African American, gentleman, in no obvious distress Vitals: Temperature 97.0 degrees, pulse 116, respirations 20, blood pressure 130/73, weight 161 pounds HEENT reveals a normocephalic, atraumatic skull, no scleral icterus, no oral lesions  Neck is supple without any cervical or supraclavicular  adenopathy.  Lungs are clear to auscultation bilaterally. There are no wheezes, rales or rhonci Cardiac is regular rate and rhythm with a normal S1 and S2. There are no murmurs, rubs, or bruits.  Abdomen is soft with good bowel sounds there's a palpable mass. There is no palpable hepatosplenomegaly. There is no palpable fluid wave.  Musculoskeletal no tenderness of the spine, ribs, or hips.  Extremities there are no clubbing, cyanosis, or edema.  Skin no petechia, purpura or ecchymosis Neurologic is nonfocal.  Laboratory Data: White count 7.9, hemoglobin 8.5, hematocrit 26.1, platelets 399,000 Ferritin, and iron panel are pending from today  Current Outpatient Prescriptions on File Prior to Visit  Medication Sig Dispense Refill  . losartan (COZAAR) 25 MG tablet Take 25 mg by mouth Daily.      . meclizine (ANTIVERT) 25 MG tablet every morning.      . metFORMIN (GLUCOPHAGE) 500 MG tablet Take 500 mg by mouth Daily.      [name] Kitchen NEXIUM 40 MG capsule Take 40 mg by mouth 2 (two) times daily.       [name] Kitchen NOVOLOG MIX 70/30 (70-30) 100 UNIT/ML injection daily with breakfast.        No current facility-administered medications on file prior to visit.   Assessment/Plan: [name] is a 66 year old, gentleman, with the following issues: #1 anemia of renal insufficiency/iron deficiency anemia-we will go ahead and give him a dose of IV Feraheme today.  We will also give him an Aranesp injection at 300 mcg.  He will continue to followup with a CBC every 3 weeks and an  Aranesp injection  for hemoglobin less than 10.  #2 followup-[name] will follow back up with [place] in 3 months, but before then should there be questions or concerns.

## 2011-10-21 NOTE — Patient Instructions (Signed)
Ferumoxytol injection What is this medicine? FERUMOXYTOL is an iron complex. Iron is used to make healthy red blood cells, which carry oxygen and nutrients throughout the body. This medicine is used to treat iron deficiency anemia in people with chronic kidney disease. This medicine may be used for other purposes; ask your health care provider or pharmacist if you have questions. What should I tell my health care provider before I take this medicine? They need to know if you have any of these conditions: -anemia not caused by low iron levels -high levels of iron in the blood -magnetic resonance imaging (MRI) test scheduled -an unusual or allergic reaction to iron, other medicines, foods, dyes, or preservatives -pregnant or trying to get pregnant -breast-feeding How should I use this medicine? This medicine is for infusion into a vein. It is given by a health care professional in a hospital or clinic setting. Talk to your pediatrician regarding the use of this medicine in children. Special care may be needed. Overdosage: If you think you've taken too much of this medicine contact a poison control center or emergency room at once. Overdosage: If you think you have taken too much of this medicine contact a poison control center or emergency room at once. NOTE: This medicine is only for you. Do not share this medicine with others. What if I miss a dose? It is important not to miss your dose. Call your doctor or health care professional if you are unable to keep an appointment. What may interact with this medicine? This medicine may interact with the following medications: -other iron products This list may not describe all possible interactions. Give your health care provider a list of all the medicines, herbs, non-prescription drugs, or dietary supplements you use. Also tell them if you smoke, drink alcohol, or use illegal drugs. Some items may interact with your medicine. What should I watch  for while using this medicine? Visit your doctor or healthcare professional regularly. Tell your doctor or healthcare professional if your symptoms do not start to get better or if they get worse. You may need blood work done while you are taking this medicine. You may need to follow a special diet. Talk to your doctor. Foods that contain iron include: whole grains/cereals, dried fruits, beans, or peas, leafy green vegetables, and organ meats (liver, kidney). What side effects may I notice from receiving this medicine? Side effects that you should report to your doctor or health care professional as soon as possible: -allergic reactions like skin rash, itching or hives, swelling of the face, lips, or tongue -breathing problems -changes in blood pressure -feeling faint or lightheaded, falls -fever or chills -flushing, sweating, or hot feelings -swelling of the ankles or feet Side effects that usually do not require medical attention (Report these to your doctor or health care professional if they continue or are bothersome.): -diarrhea -headache -nausea, vomiting -stomach pain This list may not describe all possible side effects. Call your doctor for medical advice about side effects. You may report side effects to FDA at 1-800-FDA-1088. Where should I keep my medicine? This drug is given in a hospital or clinic and will not be stored at home. NOTE: This sheet is a summary. It may not cover all possible information. If you have questions about this medicine, talk to your doctor, pharmacist, or health care provider.  2012, Elsevier/Gold Standard. (11/24/2007 9:48:25 PM) 

## 2011-10-25 ENCOUNTER — Emergency Department (HOSPITAL_BASED_OUTPATIENT_CLINIC_OR_DEPARTMENT_OTHER)
Admission: EM | Admit: 2011-10-25 | Discharge: 2011-10-25 | Disposition: A | Discharge status: Home / Self Care | Payer: Medicare Other | Attending: Emergency Medicine | Admitting: Emergency Medicine

## 2011-10-25 ENCOUNTER — Encounter (HOSPITAL_BASED_OUTPATIENT_CLINIC_OR_DEPARTMENT_OTHER): Payer: Self-pay | Admitting: Emergency Medicine

## 2011-10-25 DIAGNOSIS — E7800 Pure hypercholesterolemia, unspecified: Secondary | ICD-10-CM | POA: Insufficient documentation

## 2011-10-25 DIAGNOSIS — E119 Type 2 diabetes mellitus without complications: Secondary | ICD-10-CM | POA: Insufficient documentation

## 2011-10-25 DIAGNOSIS — I10 Essential (primary) hypertension: Secondary | ICD-10-CM | POA: Insufficient documentation

## 2011-10-25 DIAGNOSIS — D509 Iron deficiency anemia, unspecified: Secondary | ICD-10-CM | POA: Insufficient documentation

## 2011-10-25 DIAGNOSIS — R42 Dizziness and giddiness: Secondary | ICD-10-CM | POA: Insufficient documentation

## 2011-10-25 DIAGNOSIS — K219 Gastro-esophageal reflux disease without esophagitis: Secondary | ICD-10-CM | POA: Insufficient documentation

## 2011-10-25 DIAGNOSIS — F17200 Nicotine dependence, unspecified, uncomplicated: Secondary | ICD-10-CM | POA: Insufficient documentation

## 2011-10-25 MED ORDER — MECLIZINE HCL 25 MG PO TABS
25.0000 mg | ORAL_TABLET | Freq: Once | ORAL | Status: AC
  Administered 2011-10-25: 25 mg via ORAL
  Filled 2011-10-25: qty 1

## 2011-10-25 MED ORDER — MECLIZINE HCL 25 MG PO TABS: 25.0000 mg | ORAL_TABLET | Freq: Three times a day (TID) | ORAL | Status: DC | PRN

## 2011-10-25 NOTE — ED Notes (Signed)
Pt c/o feeling "swimmy headed".

## 2011-10-25 NOTE — ED Provider Notes (Signed)
History     CSN: [id number]  Arrival date & time 10/25/11  4098   First MD Initiated Contact with Patient 10/25/11 0345      Chief Complaint  Patient presents with  . Dizziness    (Consider location/radiation/quality/duration/timing/severity/associated sxs/prior treatment) HPI This is a 66 year old black male with a several day history of dizziness by which she means the room is spinning. It is worse with movement of the head. There is no associated nausea or vomiting. There's no associated tinnitus or hearing loss. He has not taken any medication for this. His medication list indicates that sometime in the past he was prescribed meclizine but he's not sure that he has anymore. He has had episodes of vertigo in the past. The symptoms are mild to moderate.  Past Medical History  Diagnosis Date  . Anemia, iron deficiency 03/24/2011  . 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 Hypertension   . Diabetes mellitus   . High cholesterol   . GERD (gastroesophageal reflux disease)   . Neuropathy     Past Surgical History  Procedure Date  . Back surgery   . Rotator cuff repair     No family history on file.  History  Substance Use Topics  . Smoking status: Current Everyday Smoker  . Smokeless tobacco: Not on file  . Alcohol Use: No      Review of Systems  All other systems reviewed and are negative.    Allergies  Review of patient's allergies indicates no known allergies.  Home Medications   Current Outpatient Rx  Name Route Sig Dispense Refill  . ATORVASTATIN CALCIUM 80 MG PO TABS  2 (two) times daily.     [name] Kitchen LOSARTAN POTASSIUM 25 MG PO TABS Oral Take 25 mg by mouth Daily.    [name] Kitchen MECLIZINE HCL 25 MG PO TABS  every morning.    [name] Kitchen METFORMIN HCL 500 MG PO TABS Oral Take 500 mg by mouth Daily.    [name] Kitchen NEXIUM 40 MG PO CPDR Oral Take 40 mg by mouth 2 (two) times daily.     [name] Kitchen NOVOLOG MIX 70/30 (70-30) 100 UNIT/ML Sylvester SUSP  daily with breakfast.       BP 148/76  Pulse 98  Temp 97.5  F (36.4 C) (Oral)  Resp 18  Ht 5\' 7"  (1.702 m)  Wt 157 lb (71.215 kg)  BMI 24.59 kg/m2  SpO2 98%  Physical Exam General: Well-developed, well-nourished male in no acute distress; appearance consistent with age of record HENT: normocephalic, atraumatic; TMs are normal appearance Eyes: pupils equal round and reactive to light; extraocular muscles intact; no nystagmus Neck: supple Heart: regular rate and rhythm Lungs: clear to auscultation bilaterally Abdomen: soft; nondistended Extremities: No deformity; full range of motion Neurologic: Awake, alert and oriented; motor function intact in all extremities and symmetric; no facial droop; normal coordination speech; negative Romberg; normal finger to nose Skin: Warm and dry Psychiatric: Normal mood and affect    ED Course  Procedures (including critical care time)     MDM          Hanley Seamen, MD 10/25/11 [phone]

## 2011-10-25 NOTE — ED Notes (Signed)
According to pt med rec reviewed by oncologist 10/21/11, pt is prescribed antivert. Pt states he has not taken this medication and didn't know it was for dizziness.

## 2011-11-11 ENCOUNTER — Ambulatory Visit (HOSPITAL_BASED_OUTPATIENT_CLINIC_OR_DEPARTMENT_OTHER): Payer: Medicare Other

## 2011-11-11 ENCOUNTER — Other Ambulatory Visit (HOSPITAL_BASED_OUTPATIENT_CLINIC_OR_DEPARTMENT_OTHER): Payer: Medicare Other | Admitting: Lab

## 2011-11-11 VITALS — BP 146/82 | HR 83 | Temp 97.0°F | Resp 18

## 2011-11-11 DIAGNOSIS — D649 Anemia, unspecified: Secondary | ICD-10-CM

## 2011-11-11 DIAGNOSIS — D509 Iron deficiency anemia, unspecified: Secondary | ICD-10-CM

## 2011-11-11 DIAGNOSIS — N289 Disorder of kidney and ureter, unspecified: Secondary | ICD-10-CM

## 2011-11-11 LAB — CBC WITH DIFFERENTIAL (CANCER CENTER ONLY)
BASO#: 0 10*3/uL (ref 0.0–0.2)
Eosinophils Absolute: 0.1 10*3/uL (ref 0.0–0.5)
HCT: 30.5 % — ABNORMAL LOW (ref 38.7–49.9)
HGB: 9.8 g/dL — ABNORMAL LOW (ref 13.0–17.1)
LYMPH#: 0.7 10*3/uL — ABNORMAL LOW (ref 0.9–3.3)
MONO#: 0.7 10*3/uL (ref 0.1–0.9)
NEUT%: 73.5 % (ref 40.0–80.0)
RBC: 3.46 10*6/uL — ABNORMAL LOW (ref 4.20–5.70)
WBC: 5.9 10*3/uL (ref 4.0–10.0)

## 2011-11-11 MED ORDER — DARBEPOETIN ALFA-POLYSORBATE 300 MCG/0.6ML IJ SOLN
300.0000 ug | Freq: Once | INTRAMUSCULAR | Status: AC
  Administered 2011-11-11: 300 ug via SUBCUTANEOUS

## 2011-11-11 NOTE — Patient Instructions (Signed)
Darbepoetin Alfa injection What is this medicine? DARBEPOETIN ALFA (dar be POE e tin AL fa) helps your body make more red blood cells. It is used to treat anemia caused by chronic kidney failure and chemotherapy. This medicine may be used for other purposes; ask your health care provider or pharmacist if you have questions. What should I tell my health care provider before I take this medicine? They need to know if you have any of these conditions: -blood clotting disorders or history of blood clots -cancer patient not on chemotherapy -cystic fibrosis -heart disease, such as angina, heart failure, or a history of a heart attack -hemoglobin level of 12 g/dL or greater -high blood pressure -low levels of folate, iron, or vitamin B12 -seizures -an unusual or allergic reaction to darbepoetin, erythropoietin, albumin, hamster proteins, latex, other medicines, foods, dyes, or preservatives -pregnant or trying to get pregnant -breast-feeding How should I use this medicine? This medicine is for injection into a vein or under the skin. It is usually given by a health care professional in a hospital or clinic setting. If you get this medicine at home, you will be taught how to prepare and give this medicine. Do not shake the solution before you withdraw a dose. Use exactly as directed. Take your medicine at regular intervals. Do not take your medicine more often than directed. It is important that you put your used needles and syringes in a special sharps container. Do not put them in a trash can. If you do not have a sharps container, call your pharmacist or healthcare provider to get one. Talk to your pediatrician regarding the use of this medicine in children. While this medicine may be used in children as young as 1 year for selected conditions, precautions do apply. Overdosage: If you think you have taken too much of this medicine contact a poison control center or emergency room at once. NOTE:  This medicine is only for you. Do not share this medicine with others. What if I miss a dose? If you miss a dose, take it as soon as you can. If it is almost time for your next dose, take only that dose. Do not take double or extra doses. What may interact with this medicine? Do not take this medicine with any of the following medications: -epoetin alfa This list may not describe all possible interactions. Give your health care provider a list of all the medicines, herbs, non-prescription drugs, or dietary supplements you use. Also tell them if you smoke, drink alcohol, or use illegal drugs. Some items may interact with your medicine. What should I watch for while using this medicine? Visit your prescriber or health care professional for regular checks on your progress and for the needed blood tests and blood pressure measurements. It is especially important for the doctor to make sure your hemoglobin level is in the desired range, to limit the risk of potential side effects and to give you the best benefit. Keep all appointments for any recommended tests. Check your blood pressure as directed. Ask your doctor what your blood pressure should be and when you should contact him or her. As your body makes more red blood cells, you may need to take iron, folic acid, or vitamin B supplements. Ask your doctor or health care provider which products are right for you. If you have kidney disease continue dietary restrictions, even though this medication can make you feel better. Talk with your doctor or health care professional about the   foods you eat and the vitamins that you take. What side effects may I notice from receiving this medicine? Side effects that you should report to your doctor or health care professional as soon as possible: -allergic reactions like skin rash, itching or hives, swelling of the face, lips, or tongue -breathing problems -changes in vision -chest pain -confusion, trouble speaking  or understanding -feeling faint or lightheaded, falls -high blood pressure -muscle aches or pains -pain, swelling, warmth in the leg -rapid weight gain -severe headaches -sudden numbness or weakness of the face, arm or leg -trouble walking, dizziness, loss of balance or coordination -seizures (convulsions) -swelling of the ankles, feet, hands -unusually weak or tired Side effects that usually do not require medical attention (report to your doctor or health care professional if they continue or are bothersome): -diarrhea -fever, chills (flu-like symptoms) -headaches -nausea, vomiting -redness, stinging, or swelling at site where injected This list may not describe all possible side effects. Call your doctor for medical advice about side effects. You may report side effects to FDA at 1-800-FDA-1088. Where should I keep my medicine? Keep out of the reach of children. Store in a refrigerator between 2 and 8 degrees C (36 and 46 degrees F). Do not freeze. Do not shake. Throw away any unused portion if using a single-dose vial. Throw away any unused medicine after the expiration date. NOTE: This sheet is a summary. It may not cover all possible information. If you have questions about this medicine, talk to your doctor, pharmacist, or health care provider.  2012, [employer]. (02/15/2008 10:23:57 AM) 

## 2011-12-02 ENCOUNTER — Other Ambulatory Visit (HOSPITAL_BASED_OUTPATIENT_CLINIC_OR_DEPARTMENT_OTHER): Payer: Medicare Other | Admitting: Lab

## 2011-12-02 ENCOUNTER — Other Ambulatory Visit: Payer: Medicare Other | Admitting: Lab

## 2011-12-02 ENCOUNTER — Ambulatory Visit (HOSPITAL_BASED_OUTPATIENT_CLINIC_OR_DEPARTMENT_OTHER): Payer: Medicare Other

## 2011-12-02 ENCOUNTER — Other Ambulatory Visit: Payer: Self-pay | Admitting: Hematology & Oncology

## 2011-12-02 ENCOUNTER — Ambulatory Visit: Payer: Medicare Other

## 2011-12-02 VITALS — BP 149/80 | HR 102 | Temp 97.8°F | Resp 18

## 2011-12-02 DIAGNOSIS — D631 Anemia in chronic kidney disease: Secondary | ICD-10-CM

## 2011-12-02 DIAGNOSIS — D509 Iron deficiency anemia, unspecified: Secondary | ICD-10-CM

## 2011-12-02 DIAGNOSIS — N289 Disorder of kidney and ureter, unspecified: Secondary | ICD-10-CM

## 2011-12-02 DIAGNOSIS — D649 Anemia, unspecified: Secondary | ICD-10-CM

## 2011-12-02 LAB — CBC WITH DIFFERENTIAL (CANCER CENTER ONLY)
BASO#: 0 10*3/uL (ref 0.0–0.2)
EOS%: 2.6 % (ref 0.0–7.0)
HCT: 26.3 % — ABNORMAL LOW (ref 38.7–49.9)
HGB: 8.3 g/dL — ABNORMAL LOW (ref 13.0–17.1)
LYMPH#: 1.2 10*3/uL (ref 0.9–3.3)
MCHC: 31.6 g/dL — ABNORMAL LOW (ref 32.0–35.9)
NEUT%: 59.5 % (ref 40.0–80.0)

## 2011-12-02 MED ORDER — DARBEPOETIN ALFA-POLYSORBATE 300 MCG/0.6ML IJ SOLN
300.0000 ug | Freq: Once | INTRAMUSCULAR | Status: AC
  Administered 2011-12-02: 300 ug via SUBCUTANEOUS

## 2011-12-02 MED ORDER — SODIUM CHLORIDE 0.9 % IV SOLN
Freq: Once | INTRAVENOUS | Status: AC
  Administered 2011-12-02: 14:00:00 via INTRAVENOUS

## 2011-12-02 MED ORDER — SODIUM CHLORIDE 0.9 % IV SOLN
1020.0000 mg | Freq: Once | INTRAVENOUS | Status: AC
  Administered 2011-12-02: 1020 mg via INTRAVENOUS
  Filled 2011-12-02: qty 34

## 2011-12-02 NOTE — Progress Notes (Signed)
I saw [name] today. He was in getting his Aranesp. We did a CBC and found that he was more anemic. His hemoglobin was 8.3. That he got iron and Aranesp 6 weeks and 3 weeks ago respectively.  He says he's not bleeding.  He feels well otherwise.  I did do a rectal exam on him. His stool was dark brown and heme positive.  We will have to find his gastroenterologist. He does have 1. He will need to be scoped again. He must be bleeding from somewhere.  He's had no nausea vomiting. He's not taking aspirin or nonsteroidals. His blood sugars probably not too well controlled but that is not uncommon for him.  His vital signs are all stable. He's had no dizziness. He's not drinking.  Again, we'll have to see about getting him to his gastroenterologist. In the meantime, he is hemodynamically stable. We will give him Aranesp and IV iron.  Pete E.

## 2011-12-02 NOTE — Patient Instructions (Signed)
Darbepoetin Alfa injection What is this medicine? DARBEPOETIN ALFA (dar be POE e tin AL fa) helps your body make more red blood cells. It is used to treat anemia caused by chronic kidney failure and chemotherapy. This medicine may be used for other purposes; ask your health care provider or pharmacist if you have questions. What should I tell my health care provider before I take this medicine? They need to know if you have any of these conditions: -blood clotting disorders or history of blood clots -cancer patient not on chemotherapy -cystic fibrosis -heart disease, such as angina, heart failure, or a history of a heart attack -hemoglobin level of 12 g/dL or greater -high blood pressure -low levels of folate, iron, or vitamin B12 -seizures -an unusual or allergic reaction to darbepoetin, erythropoietin, albumin, hamster proteins, latex, other medicines, foods, dyes, or preservatives -pregnant or trying to get pregnant -breast-feeding How should I use this medicine? This medicine is for injection into a vein or under the skin. It is usually given by a health care professional in a hospital or clinic setting. If you get this medicine at home, you will be taught how to prepare and give this medicine. Do not shake the solution before you withdraw a dose. Use exactly as directed. Take your medicine at regular intervals. Do not take your medicine more often than directed. It is important that you put your used needles and syringes in a special sharps container. Do not put them in a trash can. If you do not have a sharps container, call your pharmacist or healthcare provider to get one. Talk to your pediatrician regarding the use of this medicine in children. While this medicine may be used in children as young as 1 year for selected conditions, precautions do apply. Overdosage: If you think you have taken too much of this medicine contact a poison control center or emergency room at once. NOTE:  This medicine is only for you. Do not share this medicine with others. What if I miss a dose? If you miss a dose, take it as soon as you can. If it is almost time for your next dose, take only that dose. Do not take double or extra doses. What may interact with this medicine? Do not take this medicine with any of the following medications: -epoetin alfa This list may not describe all possible interactions. Give your health care provider a list of all the medicines, herbs, non-prescription drugs, or dietary supplements you use. Also tell them if you smoke, drink alcohol, or use illegal drugs. Some items may interact with your medicine. What should I watch for while using this medicine? Visit your prescriber or health care professional for regular checks on your progress and for the needed blood tests and blood pressure measurements. It is especially important for the doctor to make sure your hemoglobin level is in the desired range, to limit the risk of potential side effects and to give you the best benefit. Keep all appointments for any recommended tests. Check your blood pressure as directed. Ask your doctor what your blood pressure should be and when you should contact him or her. As your body makes more red blood cells, you may need to take iron, folic acid, or vitamin B supplements. Ask your doctor or health care provider which products are right for you. If you have kidney disease continue dietary restrictions, even though this medication can make you feel better. Talk with your doctor or health care professional about the   foods you eat and the vitamins that you take. What side effects may I notice from receiving this medicine? Side effects that you should report to your doctor or health care professional as soon as possible: -allergic reactions like skin rash, itching or hives, swelling of the face, lips, or tongue -breathing problems -changes in vision -chest pain -confusion, trouble speaking  or understanding -feeling faint or lightheaded, falls -high blood pressure -muscle aches or pains -pain, swelling, warmth in the leg -rapid weight gain -severe headaches -sudden numbness or weakness of the face, arm or leg -trouble walking, dizziness, loss of balance or coordination -seizures (convulsions) -swelling of the ankles, feet, hands -unusually weak or tired Side effects that usually do not require medical attention (report to your doctor or health care professional if they continue or are bothersome): -diarrhea -fever, chills (flu-like symptoms) -headaches -nausea, vomiting -redness, stinging, or swelling at site where injected This list may not describe all possible side effects. Call your doctor for medical advice about side effects. You may report side effects to FDA at 1-800-FDA-1088. Where should I keep my medicine? Keep out of the reach of children. Store in a refrigerator between 2 and 8 degrees C (36 and 46 degrees F). Do not freeze. Do not shake. Throw away any unused portion if using a single-dose vial. Throw away any unused medicine after the expiration date. NOTE: This sheet is a summary. It may not cover all possible information. If you have questions about this medicine, talk to your doctor, pharmacist, or health care provider.  2012, [employer]vier/Gold Standard. (02/15/2008 10:23:57 AM)Ferumoxytol injection What is this medicine? FERUMOXYTOL is an iron complex. Iron is used to make healthy red blood cells, which carry oxygen and nutrients throughout the body. This medicine is used to treat iron deficiency anemia in people with chronic kidney disease. This medicine may be used for other purposes; ask your health care provider or pharmacist if you have questions. What should I tell my health care provider before I take this medicine? They need to know if you have any of these conditions: -anemia not caused by low iron levels -high levels of iron in the blood -magnetic  resonance imaging (MRI) test scheduled -an unusual or allergic reaction to iron, other medicines, foods, dyes, or preservatives -pregnant or trying to get pregnant -breast-feeding How should I use this medicine? This medicine is for infusion into a vein. It is given by a health care professional in a hospital or clinic setting. Talk to your pediatrician regarding the use of this medicine in children. Special care may be needed. Overdosage: If you think you've taken too much of this medicine contact a poison control center or emergency room at once. Overdosage: If you think you have taken too much of this medicine contact a poison control center or emergency room at once. NOTE: This medicine is only for you. Do not share this medicine with others. What if I miss a dose? It is important not to miss your dose. Call your doctor or health care professional if you are unable to keep an appointment. What may interact with this medicine? This medicine may interact with the following medications: -other iron products This list may not describe all possible interactions. Give your health care provider a list of all the medicines, herbs, non-prescription drugs, or dietary supplements you use. Also tell them if you smoke, drink alcohol, or use illegal drugs. Some items may interact with your medicine. What should I watch for while using this   medicine? Visit your doctor or healthcare professional regularly. Tell your doctor or healthcare professional if your symptoms do not start to get better or if they get worse. You may need blood work done while you are taking this medicine. You may need to follow a special diet. Talk to your doctor. Foods that contain iron include: whole grains/cereals, dried fruits, beans, or peas, leafy green vegetables, and organ meats (liver, kidney). What side effects may I notice from receiving this medicine? Side effects that you should report to your doctor or health care  professional as soon as possible: -allergic reactions like skin rash, itching or hives, swelling of the face, lips, or tongue -breathing problems -changes in blood pressure -feeling faint or lightheaded, falls -fever or chills -flushing, sweating, or hot feelings -swelling of the ankles or feet Side effects that usually do not require medical attention (Report these to your doctor or health care professional if they continue or are bothersome.): -diarrhea -headache -nausea, vomiting -stomach pain This list may not describe all possible side effects. Call your doctor for medical advice about side effects. You may report side effects to FDA at 1-800-FDA-1088. Where should I keep my medicine? This drug is given in a hospital or clinic and will not be stored at home. NOTE: This sheet is a summary. It may not cover all possible information. If you have questions about this medicine, talk to your doctor, pharmacist, or health care provider.  2012, [employer]vier/Gold Standard. (11/24/2007 9:48:25 PM) 

## 2011-12-23 ENCOUNTER — Ambulatory Visit: Payer: Medicare Other

## 2011-12-23 ENCOUNTER — Other Ambulatory Visit (HOSPITAL_BASED_OUTPATIENT_CLINIC_OR_DEPARTMENT_OTHER): Payer: Medicare Other | Admitting: Lab

## 2011-12-23 DIAGNOSIS — D509 Iron deficiency anemia, unspecified: Secondary | ICD-10-CM

## 2011-12-23 LAB — CBC WITH DIFFERENTIAL (CANCER CENTER ONLY)
BASO#: 0 10*3/uL (ref 0.0–0.2)
EOS%: 2.2 % (ref 0.0–7.0)
HCT: 33.3 % — ABNORMAL LOW (ref 38.7–49.9)
HGB: 10.9 g/dL — ABNORMAL LOW (ref 13.0–17.1)
LYMPH%: 20.4 % (ref 14.0–48.0)
MCH: 28.2 pg (ref 28.0–33.4)
MCHC: 32.7 g/dL (ref 32.0–35.9)
MONO%: 12.1 % (ref 0.0–13.0)
NEUT%: 64.9 % (ref 40.0–80.0)

## 2012-01-13 ENCOUNTER — Other Ambulatory Visit (HOSPITAL_BASED_OUTPATIENT_CLINIC_OR_DEPARTMENT_OTHER): Payer: Medicare Other | Admitting: Lab

## 2012-01-13 ENCOUNTER — Ambulatory Visit (HOSPITAL_BASED_OUTPATIENT_CLINIC_OR_DEPARTMENT_OTHER): Payer: Medicare Other

## 2012-01-13 VITALS — BP 120/73 | HR 103 | Temp 97.0°F | Resp 18

## 2012-01-13 DIAGNOSIS — D509 Iron deficiency anemia, unspecified: Secondary | ICD-10-CM

## 2012-01-13 DIAGNOSIS — Z23 Encounter for immunization: Secondary | ICD-10-CM

## 2012-01-13 LAB — CBC WITH DIFFERENTIAL (CANCER CENTER ONLY)
EOS%: 2.6 % (ref 0.0–7.0)
MCH: 28.4 pg (ref 28.0–33.4)
MCHC: 33.7 g/dL (ref 32.0–35.9)
MONO%: 14.4 % — ABNORMAL HIGH (ref 0.0–13.0)
NEUT#: 2.7 10*3/uL (ref 1.5–6.5)
Platelets: 284 10*3/uL (ref 145–400)

## 2012-01-13 LAB — IRON AND TIBC
Iron: 33 ug/dL — ABNORMAL LOW (ref 42–165)
UIBC: 267 ug/dL (ref 125–400)

## 2012-01-13 MED ORDER — INFLUENZA VIRUS VACC SPLIT PF IM SUSP
0.5000 mL | Freq: Once | INTRAMUSCULAR | Status: AC
  Administered 2012-01-13: 0.5 mL via INTRAMUSCULAR
  Filled 2012-01-13: qty 0.5

## 2012-01-22 ENCOUNTER — Telehealth: Payer: Self-pay | Admitting: Hematology & Oncology

## 2012-01-22 ENCOUNTER — Ambulatory Visit (HOSPITAL_BASED_OUTPATIENT_CLINIC_OR_DEPARTMENT_OTHER): Payer: Medicare Other | Admitting: Hematology & Oncology

## 2012-01-22 ENCOUNTER — Other Ambulatory Visit (HOSPITAL_BASED_OUTPATIENT_CLINIC_OR_DEPARTMENT_OTHER): Payer: Medicare Other | Admitting: Lab

## 2012-01-22 VITALS — BP 138/73 | HR 104 | Temp 97.4°F | Resp 18 | Ht 67.0 in | Wt 162.0 lb

## 2012-01-22 DIAGNOSIS — D509 Iron deficiency anemia, unspecified: Secondary | ICD-10-CM

## 2012-01-22 DIAGNOSIS — N189 Chronic kidney disease, unspecified: Secondary | ICD-10-CM

## 2012-01-22 DIAGNOSIS — D631 Anemia in chronic kidney disease: Secondary | ICD-10-CM

## 2012-01-22 DIAGNOSIS — K922 Gastrointestinal hemorrhage, unspecified: Secondary | ICD-10-CM

## 2012-01-22 DIAGNOSIS — D500 Iron deficiency anemia secondary to blood loss (chronic): Secondary | ICD-10-CM

## 2012-01-22 LAB — CBC WITH DIFFERENTIAL (CANCER CENTER ONLY)
Eosinophils Absolute: 0.1 10*3/uL (ref 0.0–0.5)
MONO#: 0.7 10*3/uL (ref 0.1–0.9)
NEUT#: 3.5 10*3/uL (ref 1.5–6.5)
Platelets: 320 10*3/uL (ref 145–400)
RBC: 3.92 10*6/uL — ABNORMAL LOW (ref 4.20–5.70)
WBC: 5.4 10*3/uL (ref 4.0–10.0)

## 2012-01-22 LAB — FERRITIN: Ferritin: 24 ng/mL (ref 22–322)

## 2012-01-22 LAB — IRON AND TIBC
%SAT: 6 % — ABNORMAL LOW (ref 20–55)
Iron: 17 ug/dL — ABNORMAL LOW (ref 42–165)
TIBC: 267 ug/dL (ref 215–435)
UIBC: 250 ug/dL (ref 125–400)

## 2012-01-22 NOTE — Progress Notes (Signed)
This office note has been dictated.

## 2012-01-22 NOTE — Telephone Encounter (Signed)
Per Dr. Myna Hidalgo pt aware of PCP appointment with Dr. Yetta Flock

## 2012-01-23 NOTE — Progress Notes (Signed)
CC:   [name], M.D.  DIAGNOSES: 1. Recurrent gastrointestinal bleeding with subsequent iron deficiency     anemia. 2. Anemia secondary to renal insufficiency. 3. Poorly controlled insulin dependent diabetes.  CURRENT THERAPY: 1. IV iron as indicated. 2. Aranesp 300 mcg subcu as needed for hemoglobin less than 10.  INTERIM HISTORY:  [name] comes in for his followup.  He is doing okay. When I last saw him in September he was having obvious GI bleeding.  He went back to the gastroenterologist.  A colonoscopy was done.  According to [name] nothing was found.  His blood sugars are not all that well-controlled.  He says that they are "high".  He last got iron I think back in September.  In October his ferritin was 130 with iron saturation of 11%.  He feels okay.  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  PHYSICAL EXAMINATION:  General:  This is a well-developed, well- nourished black gentleman in no obvious distress.  Vital signs:  97.4, pulse 104, respiratory rate 18, blood pressure 138/73.  Weight is 162. Head and neck:  Shows a normocephalic, atraumatic skull.  There are no ocular or oral lesions.  There are no palpable cervical or supraclavicular lymph nodes.  Lungs:  Clear bilaterally.  Cardiac: Regular rate and rhythm with a normal S1 and S2.  There are no murmurs, rubs or bruits.  Abdomen:  Soft with good bowel sounds.  There is no palpable abdominal mass.  No palpable hepatosplenomegaly.  Extremities: Show no clubbing, cyanosis or edema.  Neurological:  Shows no focal neurological deficits.  LABORATORY DATA:  White cell count of 5.4, hemoglobin 11.2, hematocrit 33.2, platelet count 320.  MCV is 85.  IMPRESSION:  [name] is a 66 year old gentleman with iron deficiency anemia.  Again, he has GI bleeding which is chronic.  I suspect this is probably AVMs or angiodysplasia within the small bowel.  We will go ahead and give him some iron.  We  will get him set up next week for the iron.  I want to see him back after the holidays now.  I think that by giving him iron next week we can get him through the holidays.    ______________________________ [name], M.D. PRE/MEDQ  D:  01/22/2012  T:  01/23/2012  Job:  [id number]

## 2012-01-26 ENCOUNTER — Ambulatory Visit (HOSPITAL_BASED_OUTPATIENT_CLINIC_OR_DEPARTMENT_OTHER): Payer: Medicare Other

## 2012-01-26 VITALS — BP 148/85 | HR 89 | Temp 98.0°F | Resp 18

## 2012-01-26 DIAGNOSIS — D631 Anemia in chronic kidney disease: Secondary | ICD-10-CM

## 2012-01-26 DIAGNOSIS — K922 Gastrointestinal hemorrhage, unspecified: Secondary | ICD-10-CM

## 2012-01-26 DIAGNOSIS — D500 Iron deficiency anemia secondary to blood loss (chronic): Secondary | ICD-10-CM

## 2012-01-26 MED ORDER — SODIUM CHLORIDE 0.9 % IV SOLN
1020.0000 mg | Freq: Once | INTRAVENOUS | Status: AC
  Administered 2012-01-26: 1020 mg via INTRAVENOUS
  Filled 2012-01-26: qty 34

## 2012-01-26 MED ORDER — SODIUM CHLORIDE 0.9 % IV SOLN
INTRAVENOUS | Status: DC
  Administered 2012-01-26: 12:00:00 via INTRAVENOUS

## 2012-01-26 NOTE — Patient Instructions (Signed)
Ferumoxytol injection What is this medicine? FERUMOXYTOL is an iron complex. Iron is used to make healthy red blood cells, which carry oxygen and nutrients throughout the body. This medicine is used to treat iron deficiency anemia in people with chronic kidney disease. This medicine may be used for other purposes; ask your health care provider or pharmacist if you have questions. What should I tell my health care provider before I take this medicine? They need to know if you have any of these conditions: -anemia not caused by low iron levels -high levels of iron in the blood -magnetic resonance imaging (MRI) test scheduled -an unusual or allergic reaction to iron, other medicines, foods, dyes, or preservatives -pregnant or trying to get pregnant -breast-feeding How should I use this medicine? This medicine is for infusion into a vein. It is given by a health care professional in a hospital or clinic setting. Talk to your pediatrician regarding the use of this medicine in children. Special care may be needed. Overdosage: If you think you've taken too much of this medicine contact a poison control center or emergency room at once. Overdosage: If you think you have taken too much of this medicine contact a poison control center or emergency room at once. NOTE: This medicine is only for you. Do not share this medicine with others. What if I miss a dose? It is important not to miss your dose. Call your doctor or health care professional if you are unable to keep an appointment. What may interact with this medicine? This medicine may interact with the following medications: -other iron products This list may not describe all possible interactions. Give your health care provider a list of all the medicines, herbs, non-prescription drugs, or dietary supplements you use. Also tell them if you smoke, drink alcohol, or use illegal drugs. Some items may interact with your medicine. What should I watch  for while using this medicine? Visit your doctor or healthcare professional regularly. Tell your doctor or healthcare professional if your symptoms do not start to get better or if they get worse. You may need blood work done while you are taking this medicine. You may need to follow a special diet. Talk to your doctor. Foods that contain iron include: whole grains/cereals, dried fruits, beans, or peas, leafy green vegetables, and organ meats (liver, kidney). What side effects may I notice from receiving this medicine? Side effects that you should report to your doctor or health care professional as soon as possible: -allergic reactions like skin rash, itching or hives, swelling of the face, lips, or tongue -breathing problems -changes in blood pressure -feeling faint or lightheaded, falls -fever or chills -flushing, sweating, or hot feelings -swelling of the ankles or feet Side effects that usually do not require medical attention (Report these to your doctor or health care professional if they continue or are bothersome.): -diarrhea -headache -nausea, vomiting -stomach pain This list may not describe all possible side effects. Call your doctor for medical advice about side effects. You may report side effects to FDA at 1-800-FDA-1088. Where should I keep my medicine? This drug is given in a hospital or clinic and will not be stored at home. NOTE: This sheet is a summary. It may not cover all possible information. If you have questions about this medicine, talk to your doctor, pharmacist, or health care provider.  2012, Elsevier/Gold Standard. (11/24/2007 9:48:25 PM) 

## 2012-01-29 ENCOUNTER — Encounter: Payer: Self-pay | Admitting: Internal Medicine

## 2012-01-29 ENCOUNTER — Ambulatory Visit (INDEPENDENT_AMBULATORY_CARE_PROVIDER_SITE_OTHER): Payer: Medicare Other | Admitting: Internal Medicine

## 2012-01-29 VITALS — BP 144/78 | HR 101 | Temp 98.1°F | Resp 16 | Ht 67.5 in | Wt 163.0 lb

## 2012-01-29 DIAGNOSIS — E1165 Type 2 diabetes mellitus with hyperglycemia: Secondary | ICD-10-CM

## 2012-01-29 DIAGNOSIS — E785 Hyperlipidemia, unspecified: Secondary | ICD-10-CM

## 2012-01-29 DIAGNOSIS — I10 Essential (primary) hypertension: Secondary | ICD-10-CM

## 2012-01-29 DIAGNOSIS — Z720 Tobacco use: Secondary | ICD-10-CM

## 2012-01-29 DIAGNOSIS — E119 Type 2 diabetes mellitus without complications: Secondary | ICD-10-CM

## 2012-01-29 DIAGNOSIS — F17200 Nicotine dependence, unspecified, uncomplicated: Secondary | ICD-10-CM

## 2012-01-29 MED ORDER — INSULIN LISPRO 100 UNIT/ML ~~LOC~~ SOLN: 5.0000 [IU] | Freq: Three times a day (TID) | SUBCUTANEOUS | Status: DC

## 2012-01-29 MED ORDER — LOSARTAN POTASSIUM 50 MG PO TABS: 50.0000 mg | ORAL_TABLET | Freq: Every day | ORAL | Status: DC

## 2012-01-29 MED ORDER — INSULIN GLARGINE 100 UNIT/ML ~~LOC~~ SOLN: 50.0000 [IU] | Freq: Every day | SUBCUTANEOUS | Status: DC

## 2012-01-29 MED ORDER — VARENICLINE TARTRATE 0.5 MG X 11 & 1 MG X 42 PO MISC: ORAL | Status: DC

## 2012-01-30 LAB — LIPID PANEL
HDL: 33 mg/dL — ABNORMAL LOW (ref >39)
LDL Cholesterol: 110 mg/dL — ABNORMAL HIGH (ref 0–99)
Triglycerides: 149 mg/dL (ref <150)
VLDL: 30 mg/dL (ref 0–40)

## 2012-01-30 LAB — HEPATIC FUNCTION PANEL
Albumin: 3.6 g/dL (ref 3.5–5.2)
Indirect Bilirubin: 0.2 mg/dL (ref 0.0–0.9)
Total Bilirubin: 0.3 mg/dL (ref 0.3–1.2)
Total Protein: 6.5 g/dL (ref 6.0–8.3)

## 2012-01-30 LAB — BASIC METABOLIC PANEL
BUN: 9 mg/dL (ref 6–23)
CO2: 29 mEq/L (ref 19–32)
Calcium: 9.3 mg/dL (ref 8.4–10.5)
Glucose, Bld: 69 mg/dL — ABNORMAL LOW (ref 70–99)
Sodium: 139 mEq/L (ref 135–145)

## 2012-01-30 LAB — HEMOGLOBIN A1C: Hgb A1c MFr Bld: 9.7 % — ABNORMAL HIGH (ref <5.7)

## 2012-02-01 DIAGNOSIS — E1140 Type 2 diabetes mellitus with diabetic neuropathy, unspecified: Secondary | ICD-10-CM | POA: Insufficient documentation

## 2012-02-01 DIAGNOSIS — I10 Essential (primary) hypertension: Secondary | ICD-10-CM | POA: Insufficient documentation

## 2012-02-01 DIAGNOSIS — E785 Hyperlipidemia, unspecified: Secondary | ICD-10-CM | POA: Insufficient documentation

## 2012-02-01 DIAGNOSIS — Z720 Tobacco use: Secondary | ICD-10-CM | POA: Insufficient documentation

## 2012-02-01 DIAGNOSIS — IMO0002 Reserved for concepts with insufficient information to code with codable children: Secondary | ICD-10-CM

## 2012-02-01 HISTORY — DX: Essential (primary) hypertension: I10

## 2012-02-01 HISTORY — DX: Reserved for concepts with insufficient information to code with codable children: IMO0002

## 2012-02-01 HISTORY — DX: Type 2 diabetes mellitus with diabetic neuropathy, unspecified: E11.40

## 2012-02-01 HISTORY — DX: Tobacco use: Z72.0

## 2012-02-01 HISTORY — DX: Hyperlipidemia, unspecified: E78.5

## 2012-02-01 NOTE — Assessment & Plan Note (Signed)
Long discussion held. Reviewed target goals and different mechanisms of action for insulin. Stop insulin seventy thirty. Resume Lantus fifty units each bedtime. Add NovoLog five units q. a.c. Monitor blood sugars in the morning and prior to meals. Schedule close followup. Obtain Chem-7 and A1c.

## 2012-02-01 NOTE — Progress Notes (Signed)
  Subjective:    Patient ID: [name], male    DOB: 10-14-1945, 66 y.o.   MRN: [id number]  HPI patient presents to clinic to establish a ring care and for followup of multiple medical problems. History of diabetes mellitus under suboptimal control. Notes a daytime blood sugars in the mid two hundred range but sometimes at night has blood sugars in the 40s. Currently taking seventy thirty insulin twice a day with metformin as well. States previously took Lantus fifty units each bedtime but did not achieve adequate control. Blood pressure mildly elevated and states this is typical. History of tobacco use failing Wellbutrin. Wishes to attempt cessation.  Past Medical History  Diagnosis Date  . Anemia, iron deficiency 03/24/2011  . 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 Hypertension   . Diabetes mellitus   . High cholesterol   . GERD (gastroesophageal reflux disease)   . Neuropathy    Past Surgical History  Procedure Date  . Back surgery   . Rotator cuff repair     reports that he has been smoking.  He does not have any smokeless tobacco history on file. He reports that he does not drink alcohol or use illicit drugs. family history is not on file. No Known Allergies   Review of Systems  Respiratory: Negative for shortness of breath.   Cardiovascular: Negative for chest pain.  All other systems reviewed and are negative.       Objective:   Physical Exam  Nursing note and vitals reviewed. Constitutional: He appears well-developed and well-nourished. No distress.  HENT:  Head: Normocephalic and atraumatic.  Right Ear: External ear normal.  Left Ear: External ear normal.  Eyes: Conjunctivae normal are normal. No scleral icterus.  Neck: Neck supple. Carotid bruit is not present.  Cardiovascular: Normal rate, regular rhythm and normal heart sounds.  Exam reveals no gallop and no friction rub.   No murmur heard. Pulmonary/Chest: Effort normal and breath sounds normal. No respiratory  distress. He has no wheezes. He has no rales.  Lymphadenopathy:    He has no cervical adenopathy.  Neurological: He is alert.  Skin: Skin is warm and dry. He is not diaphoretic.  Psychiatric: He has a normal mood and affect.          Assessment & Plan:

## 2012-02-01 NOTE — Assessment & Plan Note (Signed)
Suboptimal control. Increase losartan 50 mg daily

## 2012-02-01 NOTE — Assessment & Plan Note (Signed)
Counseled regarding the need for cessation. Failed Wellbutrin. Attempt Chantix. 

## 2012-02-01 NOTE — Assessment & Plan Note (Signed)
Obtain fasting lipid profile and liver function tests. 

## 2012-02-03 ENCOUNTER — Encounter: Payer: Self-pay | Admitting: Internal Medicine

## 2012-02-03 ENCOUNTER — Ambulatory Visit (INDEPENDENT_AMBULATORY_CARE_PROVIDER_SITE_OTHER): Payer: Medicare Other | Admitting: Internal Medicine

## 2012-02-03 VITALS — BP 122/82 | HR 86 | Temp 98.0°F | Resp 16 | Wt 165.0 lb

## 2012-02-03 DIAGNOSIS — N529 Male erectile dysfunction, unspecified: Secondary | ICD-10-CM

## 2012-02-03 DIAGNOSIS — M7989 Other specified soft tissue disorders: Secondary | ICD-10-CM

## 2012-02-03 MED ORDER — FUROSEMIDE 20 MG PO TABS: ORAL_TABLET | ORAL | Status: DC

## 2012-02-03 MED ORDER — VARDENAFIL HCL 10 MG PO TABS
10.0000 mg | ORAL_TABLET | Freq: Every day | ORAL | Status: DC | PRN
Start: 2012-02-03 — End: 2012-09-20

## 2012-02-03 NOTE — Assessment & Plan Note (Signed)
Attempt Levitra when necessary. Dosing instructions provided. No nitrate use.

## 2012-02-03 NOTE — Assessment & Plan Note (Signed)
Obtain lower extremity ultrasound rule out DVT. If negative attempt Lasix when necessary.

## 2012-02-03 NOTE — Progress Notes (Signed)
  Subjective:    Patient ID: [name], male    DOB: 1946/02/20, 66 y.o.   MRN: [id number]  HPI patient present to clinic for evaluation of leg swelling. Notes an approximate one-week history of right greater than left lower extremity swelling specifically. No associated pain, shortness of breath or chest pain. No alleviating or exacerbating factors. No recent risk factors for DVT. No known history of clot or family history of clot. Insulin regiment recently changed to include Lantus and q. a.c. short acting insulin. Reports recent blood sugar of 94. No hypoglycemia. Status post increase of losartan and blood pressure now normotensive. Complains erectile dysfunction chronically. Has intact libido. Has not attempted medication and takes no nitroglycerin.  Past Medical History  Diagnosis Date  . Anemia, iron deficiency 03/24/2011  . 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 Hypertension   . Diabetes mellitus   . High cholesterol   . GERD (gastroesophageal reflux disease)   . Neuropathy    Past Surgical History  Procedure Date  . Back surgery   . Rotator cuff repair     reports that he has been smoking.  He does not have any smokeless tobacco history on file. He reports that he does not drink alcohol or use illicit drugs. family history is not on file. No Known Allergies   Review of Systems see history of present illness     Objective:   Physical Exam  Nursing note and vitals reviewed. Constitutional: He appears well-developed and well-nourished. No distress.  HENT:  Head: Normocephalic and atraumatic.  Eyes: Conjunctivae normal are normal. No scleral icterus.  Neurological: He is alert.  Skin: Skin is warm and dry. He is not diaphoretic.       Mild right calf swelling when compared to left. No palpable cords. Homans sign negative. Gait normal. No associated erythema warmth or tenderness.  Psychiatric: He has a normal mood and affect.          Assessment & Plan:

## 2012-02-04 ENCOUNTER — Ambulatory Visit (HOSPITAL_BASED_OUTPATIENT_CLINIC_OR_DEPARTMENT_OTHER)
Admission: RE | Admit: 2012-02-04 | Discharge: 2012-02-04 | Disposition: A | Discharge status: Home / Self Care | Payer: Medicare Other | Source: Ambulatory Visit | Attending: Internal Medicine | Admitting: Internal Medicine

## 2012-02-04 DIAGNOSIS — M7989 Other specified soft tissue disorders: Secondary | ICD-10-CM | POA: Insufficient documentation

## 2012-02-13 ENCOUNTER — Ambulatory Visit (INDEPENDENT_AMBULATORY_CARE_PROVIDER_SITE_OTHER): Payer: Medicare Other | Admitting: Internal Medicine

## 2012-02-13 ENCOUNTER — Encounter: Payer: Self-pay | Admitting: Internal Medicine

## 2012-02-13 VITALS — BP 146/86 | HR 105 | Temp 97.9°F | Resp 16 | Wt 167.8 lb

## 2012-02-13 DIAGNOSIS — E1165 Type 2 diabetes mellitus with hyperglycemia: Secondary | ICD-10-CM

## 2012-02-13 DIAGNOSIS — I10 Essential (primary) hypertension: Secondary | ICD-10-CM

## 2012-02-13 DIAGNOSIS — Z720 Tobacco use: Secondary | ICD-10-CM

## 2012-02-13 DIAGNOSIS — F17200 Nicotine dependence, unspecified, uncomplicated: Secondary | ICD-10-CM

## 2012-02-13 NOTE — Assessment & Plan Note (Signed)
Counseled regarding the need for cessation. Patient states understanding 

## 2012-02-13 NOTE — Patient Instructions (Addendum)
Please schedule fasting labs prior to next visit Chem7, a1c, urine microalbumin-250.00 and lipid-272.4 

## 2012-02-13 NOTE — Assessment & Plan Note (Signed)
Improved but with variable control. Advised to follow a diabetic diet to reduce variability. Call with blood sugar log/report.

## 2012-02-13 NOTE — Progress Notes (Signed)
  Subjective:    Patient ID: [name], male    DOB: 26-Nov-1945, 66 y.o.   MRN: [id number]  HPI Pt presents to clinic for followup of multiple medical problems. Has successfully transitioned  to Lantus and short acting insulin with meals. Did not bring glucometer or log. Recalls morning blood sugars of 65, 148 and 190. No further hypoglycemia. Daytime blood sugars range between 80, 125 and 200 as a peak. Admits to some degree of dietary indiscretion. Blood pressure mildly elevated. Has reduced tobacco use down to currently 5 cigarettes daily. Did not fill prescription for Chantix because of cost. Previously failed Zyban.  Past Medical History  Diagnosis Date  . Anemia, iron deficiency 03/24/2011  . 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 Hypertension   . Diabetes mellitus   . High cholesterol   . GERD (gastroesophageal reflux disease)   . Neuropathy    Past Surgical History  Procedure Date  . Back surgery   . Rotator cuff repair     reports that he has been smoking.  He does not have any smokeless tobacco history on file. He reports that he does not drink alcohol or use illicit drugs. family history is not on file. No Known Allergies    Review of Systems see hpi      Objective:   Physical Exam  Nursing note and vitals reviewed. Constitutional: He appears well-developed and well-nourished. No distress.  HENT:  Head: Normocephalic and atraumatic.  Skin: He is not diaphoretic.          Assessment & Plan:

## 2012-02-13 NOTE — Assessment & Plan Note (Signed)
Recent increase of Cozaar. Continue current dosing. Monitor blood pressure as an outpatient and followup in clinic as scheduled. If remains mildly elevated tartrate Cozaar further.

## 2012-02-20 ENCOUNTER — Telehealth: Payer: Self-pay | Admitting: *Deleted

## 2012-02-20 NOTE — Telephone Encounter (Signed)
Informed caller [Heather] that message has been left for pt return call RE: authorization for request; must have OV for A&E prior to receiving, caller states that she will also f/u w/patient RE: scheduling OV to discuss this problem & request/SLS

## 2012-02-20 NOTE — Telephone Encounter (Signed)
Received request from [facility] for order of Transcutaneous Electrical Nerve Stimulator [TENS] unit, for 2-mth rental period prior to permanent unit; d/t back pain. Per provider, this has no previous documentation of complaint in previous medical records; pt need OV to discuss.  LMOM with contact name & number for return call RE: TENS request/SLS

## 2012-02-23 ENCOUNTER — Encounter: Payer: Self-pay | Admitting: Internal Medicine

## 2012-02-23 ENCOUNTER — Ambulatory Visit (INDEPENDENT_AMBULATORY_CARE_PROVIDER_SITE_OTHER): Payer: Medicare Other | Admitting: Internal Medicine

## 2012-02-23 VITALS — BP 138/78 | HR 110 | Temp 97.8°F | Resp 12 | Wt 163.1 lb

## 2012-02-23 DIAGNOSIS — N529 Male erectile dysfunction, unspecified: Secondary | ICD-10-CM

## 2012-02-23 DIAGNOSIS — M549 Dorsalgia, unspecified: Secondary | ICD-10-CM | POA: Insufficient documentation

## 2012-02-23 NOTE — Assessment & Plan Note (Signed)
Sample bottle of levitra 20mg  #4 provided.

## 2012-02-23 NOTE — Progress Notes (Signed)
  Subjective:    Patient ID: [name], male    DOB: 04-Sep-1945, 66 y.o.   MRN: [id number]  HPI [name] presents to clinic to followup his history of back pain. 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 Reports only rare right lbp that is radicular to right leg. No leg weakness. Reinforces that it is only rare and does not bother him much but states that a medical equipment company is regularly calling him trying to sign him up for back braces and most recent a electric stimulator that helps neuropathy. Also notes intermittent ED sx's- levitra helps but is expensive.  Past Medical History  Diagnosis Date  . Anemia, iron deficiency 03/24/2011  . 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 Hypertension   . Diabetes mellitus   . High cholesterol   . GERD (gastroesophageal reflux disease)   . Neuropathy    Past Surgical History  Procedure Date  . Back surgery   . Rotator cuff repair     reports that he has been smoking.  He does not have any smokeless tobacco history on file. He reports that he does not drink alcohol or use illicit drugs. family history is not on file. No Known Allergies   Review of Systems see hpi     Objective:   Physical Exam  Nursing note and vitals reviewed. Constitutional: He appears well-developed and well-nourished. No distress.  HENT:  Head: Normocephalic and atraumatic.  Musculoskeletal:       Right LE strength 5/5. Gait nl  Neurological: He is alert.  Skin: He is not diaphoretic.  Psychiatric: He has a normal mood and affect.          Assessment & Plan:

## 2012-02-23 NOTE — Assessment & Plan Note (Signed)
States has minimal and rare sx's. Do not feel the offered medical equipment would be that helpful and he agrees.

## 2012-03-02 ENCOUNTER — Other Ambulatory Visit: Payer: Self-pay | Admitting: Internal Medicine

## 2012-03-03 NOTE — Telephone Encounter (Signed)
Our med list indicates metformin is once a day and I do not see that we have filled Rx before.  Pharmacy directions are twice a day. How should pt be taking this medication?  Medication name:  Name from pharmacy:  metFORMIN (GLUCOPHAGE) 500 MG tablet  METFORMIN HCL 500 MG TABLET Sig: TAKE 1 TAB(S) 2 TIMES A DAY ORALLY Dispense: 60 tablet Refills: 3 Start: 03/02/2012 Class: Normal Requested on: 06/09/2011 Originally ordered on: 05/14/2011 Last refill: 01/03/2012

## 2012-03-03 NOTE — Telephone Encounter (Signed)
Ok to fill bid. Believe he told us might be taking qd

## 2012-03-06 ENCOUNTER — Encounter: Payer: Self-pay | Admitting: Family Medicine

## 2012-03-06 ENCOUNTER — Ambulatory Visit (INDEPENDENT_AMBULATORY_CARE_PROVIDER_SITE_OTHER): Payer: Medicare Other | Admitting: Family Medicine

## 2012-03-06 VITALS — BP 120/72 | HR 118 | Temp 98.1°F | Ht 67.0 in | Wt 164.5 lb

## 2012-03-06 DIAGNOSIS — E1165 Type 2 diabetes mellitus with hyperglycemia: Secondary | ICD-10-CM

## 2012-03-06 DIAGNOSIS — Z720 Tobacco use: Secondary | ICD-10-CM

## 2012-03-06 DIAGNOSIS — I10 Essential (primary) hypertension: Secondary | ICD-10-CM

## 2012-03-06 DIAGNOSIS — J40 Bronchitis, not specified as acute or chronic: Secondary | ICD-10-CM | POA: Insufficient documentation

## 2012-03-06 DIAGNOSIS — F17200 Nicotine dependence, unspecified, uncomplicated: Secondary | ICD-10-CM

## 2012-03-06 MED ORDER — METHYLPREDNISOLONE 4 MG PO KIT: PACK | ORAL | Status: DC

## 2012-03-06 MED ORDER — BENZONATATE 200 MG PO CAPS: 200.0000 mg | ORAL_CAPSULE | Freq: Three times a day (TID) | ORAL | Status: DC | PRN

## 2012-03-06 MED ORDER — AMOXICILLIN-POT CLAVULANATE 875-125 MG PO TABS: 1.0000 | ORAL_TABLET | Freq: Two times a day (BID) | ORAL | Status: DC

## 2012-03-06 NOTE — Patient Instructions (Addendum)

## 2012-03-06 NOTE — Assessment & Plan Note (Signed)
Augmentin, medrol dosepak and Tessalon perles prn. Increase rest and hydration, probiotics

## 2012-03-06 NOTE — Assessment & Plan Note (Signed)
Numbers ranging from 60 to 300 this past week, encouraged him to limit carbs, increase checks to 3 x a day while on steroids and can increase Lantus to 52 units daily temporarily if numbers remain above 200

## 2012-03-06 NOTE — Assessment & Plan Note (Signed)
Smoking 6 per day, encouraged cessation

## 2012-03-06 NOTE — Assessment & Plan Note (Signed)
Well controlled despite acute illness 

## 2012-03-06 NOTE — Progress Notes (Signed)
[name] [id number] October 27, 1945 03/06/2012      Progress Note-Follow Up  Subjective  Chief Complaint  Chief Complaint  Patient presents with  . Cough    Productive cough with yellow mucus x 2 days    HPI  Patient is a 66 year old male who is in today with several days worth of worsening respiratory symptoms. There is a sore throat as well as nasal congestion. Generalized headache malaise and fatigue. Worse symptom is cough which is now productive of yellow phlegm present in right worse at night. Having trouble sleeping due to the severity of the cough. This is often very thick and difficult to cough up. Denies chest pain except when coughing. No palpitations or wheezing. No GI or GU complaints. No ear pain. No blood sugars with acute illness have been noted. Unfortunately he does continue to smoke.  Past Medical History  Diagnosis Date  . Anemia, iron deficiency 03/24/2011  . 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 Hypertension   . Diabetes mellitus   . High cholesterol   . GERD (gastroesophageal reflux disease)   . Neuropathy   . Bronchitis 03/06/2012    Past Surgical History  Procedure Date  . Back surgery   . Rotator cuff repair     History reviewed. No pertinent family history.  History   Social History  . Marital Status: Married    Spouse Name: N/A    Number of Children: N/A  . Years of Education: N/A   Occupational History  . Not on file.   Social History Main Topics  . Smoking status: Current Every Day Smoker  . Smokeless tobacco: Not on file  . Alcohol Use: No  . Drug Use: No  . Sexually Active:    Other Topics Concern  . Not on file   Social History Narrative  . No narrative on file    Current Outpatient Prescriptions on File Prior to Visit  Medication Sig Dispense Refill  . atorvastatin (LIPITOR) 80 MG tablet daily.       . furosemide (LASIX) 20 MG tablet One by mouth in the morning as needed for swelling      . insulin glargine (LANTUS  SOLOSTAR) 100 UNIT/ML injection Inject 50 Units into the skin at bedtime.  5 pen  6  . insulin lispro (HUMALOG) 100 UNIT/ML injection Inject 5 Units into the skin 3 (three) times daily before meals.  10 mL  12  . losartan (COZAAR) 50 MG tablet Take 1 tablet (50 mg total) by mouth daily.  30 tablet  6  . metFORMIN (GLUCOPHAGE) 500 MG tablet TAKE 1 TAB(S) 2 TIMES A DAY ORALLY  60 tablet  3  . NEXIUM 40 MG capsule Take 40 mg by mouth 2 (two) times daily.       . vardenafil (LEVITRA) 10 MG tablet Take 1 tablet (10 mg total) by mouth daily as needed for erectile dysfunction.  6 tablet  2  . varenicline (CHANTIX STARTING MONTH PAK) 0.5 MG X 11 & 1 MG X 42 tablet Take one 0.5 mg tablet by mouth once daily for 3 days, then increase to one 0.5 mg tablet twice daily for 4 days, then increase to one 1 mg tablet twice daily.  53 tablet  0  . VIGAMOX 0.5 % ophthalmic solution Place 1 drop into both eyes 3 (three) times daily.         No Known Allergies  Review of Systems  Review of Systems  Constitutional: Positive  for malaise/fatigue. Negative for fever.  HENT: Positive for congestion and sore throat.   Eyes: Negative for discharge.  Respiratory: Positive for cough, sputum production and shortness of breath.   Cardiovascular: Positive for chest pain. Negative for palpitations and leg swelling.  Gastrointestinal: Negative for nausea, abdominal pain and diarrhea.  Genitourinary: Negative for dysuria.  Musculoskeletal: Negative for falls.  Skin: Negative for rash.  Neurological: Positive for headaches. Negative for loss of consciousness.  Endo/Heme/Allergies: Negative for polydipsia.  Psychiatric/Behavioral: Negative for depression and suicidal ideas. The patient is not nervous/anxious and does not have insomnia.     Objective  BP 120/72  Pulse 118  Temp 98.1 F (36.7 C) (Oral)  Ht 5\' 7"  (1.702 m)  Wt 164 lb 8 oz (74.617 kg)  BMI 25.76 kg/m2  SpO2 94%  Physical Exam  Physical Exam   Constitutional: He is oriented to person, place, and time and well-developed, well-nourished, and in no distress. No distress.  HENT:  Head: Normocephalic and atraumatic.       Dry  Mucus membranes  Eyes: Conjunctivae normal are normal.  Neck: Neck supple. No thyromegaly present.  Cardiovascular: Normal rate, regular rhythm and normal heart sounds.   No murmur heard. Pulmonary/Chest: Effort normal. No respiratory distress.       LLL rhonchi  Abdominal: He exhibits no distension and no mass. There is no tenderness.  Musculoskeletal: He exhibits no edema.  Neurological: He is alert and oriented to person, place, and time.  Skin: Skin is warm.  Psychiatric: Memory, affect and judgment normal.    No results found for this basename: TSH   Lab Results  Component Value Date   WBC 5.4 01/22/2012   HGB 11.2* 01/22/2012   HCT 33.2* 01/22/2012   MCV 85 01/22/2012   PLT 320 01/22/2012   Lab Results  Component Value Date   CREATININE 1.03 01/29/2012   BUN 9 01/29/2012   NA 139 01/29/2012   K 3.7 01/29/2012   CL 104 01/29/2012   CO2 29 01/29/2012   Lab Results  Component Value Date   ALT 13 01/29/2012   AST 16 01/29/2012   ALKPHOS 104 01/29/2012   BILITOT 0.3 01/29/2012   Lab Results  Component Value Date   CHOL 173 01/29/2012   Lab Results  Component Value Date   HDL 33* 01/29/2012   Lab Results  Component Value Date   LDLCALC 110* 01/29/2012   Lab Results  Component Value Date   TRIG 149 01/29/2012   Lab Results  Component Value Date   CHOLHDL 5.2 01/29/2012     Assessment & Plan  Bronchitis Augmentin, medrol dosepak and Tessalon perles prn. Increase rest and hydration, probiotics  Tobacco use Smoking 6 per day, encouraged cessation  Diabetes mellitus type 2, uncontrolled Numbers ranging from 60 to 300 this past week, encouraged him to limit carbs, increase checks to 3 x a day while on steroids and can increase Lantus to 52 units daily temporarily if numbers  remain above 200  Unspecified essential hypertension Well controlled despite acute illness

## 2012-03-15 ENCOUNTER — Other Ambulatory Visit (HOSPITAL_BASED_OUTPATIENT_CLINIC_OR_DEPARTMENT_OTHER): Payer: Medicare Other | Admitting: Lab

## 2012-03-15 ENCOUNTER — Ambulatory Visit (HOSPITAL_BASED_OUTPATIENT_CLINIC_OR_DEPARTMENT_OTHER): Payer: Medicare Other

## 2012-03-15 ENCOUNTER — Telehealth: Payer: Self-pay | Admitting: Hematology & Oncology

## 2012-03-15 ENCOUNTER — Ambulatory Visit (HOSPITAL_BASED_OUTPATIENT_CLINIC_OR_DEPARTMENT_OTHER): Payer: Medicare Other | Admitting: Medical

## 2012-03-15 VITALS — BP 135/68 | HR 109 | Temp 97.9°F | Resp 18 | Ht 67.0 in | Wt 162.0 lb

## 2012-03-15 DIAGNOSIS — D509 Iron deficiency anemia, unspecified: Secondary | ICD-10-CM

## 2012-03-15 DIAGNOSIS — D500 Iron deficiency anemia secondary to blood loss (chronic): Secondary | ICD-10-CM

## 2012-03-15 DIAGNOSIS — K922 Gastrointestinal hemorrhage, unspecified: Secondary | ICD-10-CM

## 2012-03-15 DIAGNOSIS — D649 Anemia, unspecified: Secondary | ICD-10-CM

## 2012-03-15 LAB — CBC WITH DIFFERENTIAL (CANCER CENTER ONLY)
BASO%: 0.8 % (ref 0.0–2.0)
Eosinophils Absolute: 0.2 10*3/uL (ref 0.0–0.5)
HCT: 23.1 % — ABNORMAL LOW (ref 38.7–49.9)
LYMPH#: 1.6 10*3/uL (ref 0.9–3.3)
LYMPH%: 22.1 % (ref 14.0–48.0)
MCV: 86 fL (ref 82–98)
MONO#: 0.9 10*3/uL (ref 0.1–0.9)
NEUT%: 61.7 % (ref 40.0–80.0)
Platelets: 570 10*3/uL — ABNORMAL HIGH (ref 145–400)
RBC: 2.68 10*6/uL — ABNORMAL LOW (ref 4.20–5.70)
WBC: 7.4 10*3/uL (ref 4.0–10.0)

## 2012-03-15 MED ORDER — SODIUM CHLORIDE 0.9 % IV SOLN
1020.0000 mg | Freq: Once | INTRAVENOUS | Status: AC
  Administered 2012-03-15: 1020 mg via INTRAVENOUS
  Filled 2012-03-15: qty 34

## 2012-03-15 MED ORDER — SODIUM CHLORIDE 0.9 % IV SOLN
Freq: Once | INTRAVENOUS | Status: AC
  Administered 2012-03-15: 10:00:00 via INTRAVENOUS

## 2012-03-15 NOTE — Telephone Encounter (Signed)
Patient walked into office complaining about not feeling well.  i spoke with Alvino Chapel and Per MD to sch patient for lab and French Ana today for add on.

## 2012-03-15 NOTE — Progress Notes (Signed)
Diagnoses: #1.  Recurrent gastrointestinal bleeding, with subsequent iron deficiency anemia. #2.  Anemia, secondary to renal insufficiency. #3.  Poorly controlled insulin-dependent diabetes.  Current therapy: #1 IV iron indicated. #2 Aranesp 300 mcg subcutaneous as needed.  For hemoglobin less than 10.   Interim history: Mr. Linnemann presents today as a work in.  He, reports, that he is been extremely fatigued, has had intermittent dizziness, and dyspnea on exertion.  He, reports, about a week, and a half ago, he came down with a cold.  He was having a productive cough.  He did see a physician, who gave him a steroid taper, as well as antibiotics.  In reviewing Mr. Berenson, CBC today, it was noted.  His hemoglobin is 7.2, hematocrit 23.1, MCV 86.  He will require IV iron.  More than likely he is having some type of recurrent GI bleed.  He does not report any obvious, bleeding.  He denies any melena, or hematochezia.  He, reports, that he was seen by gastroenterologist.  3 weeks ago, and had, what sounded like a capsule endoscopy, done.  Unfortunately, we do not have the results of that study.  He otherwise reports, that he has a decent appetite although he has lost about 2 pounds.  He denies any nausea, vomiting, diarrhea, constipation, any chest pain, or cough.  He denies any fevers, chills, or night sweats.  He denies any headaches, visual changes, or rashes.  He is not craving any ice.  He denies any abdominal pain. Of note, Mr. Meskill received IV iron 65back in November.  His iron was 17 with 6% saturation.  His ferritin was 24.  I did advise Mr. Fatzinger to follow back up with his gastroenterologist, as he, most likely has a recurrent GI bleed.  Review of Systems: Constitutional:Negative for malaise/fatigue, fever, chills, weight loss, diaphoresis, activity change, appetite change, and unexpected weight change.  HEENT: Negative for double vision, blurred vision, visual loss, ear pain, tinnitus, congestion,  rhinorrhea, epistaxis sore throat or sinus disease, oral pain/lesion, tongue soreness Respiratory: Negative for cough, chest tightness, shortness of breath, wheezing and stridor.  Cardiovascular: Negative for chest pain, palpitations, leg swelling, orthopnea, PND, DOE or claudication Gastrointestinal: Negative for nausea, vomiting, abdominal pain, diarrhea, constipation, blood in stool, melena, hematochezia, abdominal distention, anal bleeding, rectal pain, anorexia and hematemesis.  Genitourinary: Negative for dysuria, frequency, hematuria,  Musculoskeletal: Negative for myalgias, back pain, joint swelling, arthralgias and gait problem.  Skin: Negative for rash, color change, pallor and wound.  Neurological:. Negative for dizziness/light-headedness, tremors, seizures, syncope, facial asymmetry, speech difficulty, weakness, numbness, headaches and paresthesias.  Hematological: Negative for adenopathy. Does not bruise/bleed easily.  Psychiatric/Behavioral:  Negative for depression, no loss of interest in normal activity or change in sleep pattern.   Physical Exam: This is a thin, pleasant, 66 year old, African [place] male, in no obvious distress Vitals: Temperature 97.8 degrees, pulse 109, respirations 18, blood pressure 135/68, weight 162 pounds HEENT reveals a normocephalic, atraumatic skull, no scleral icterus, no oral lesions  Neck is supple without any cervical or supraclavicular adenopathy.  Lungs are clear to auscultation bilaterally. There are no wheezes, rales or rhonci Cardiac is  Tachy with a regular rate and rhythm with a normal S1 and S2. There are no murmurs, rubs, or bruits.  Abdomen is soft with good bowel sounds, there is no palpable mass. There is no palpable hepatosplenomegaly. There is no palpable fluid wave.  Musculoskeletal no tenderness of the spine, ribs, or hips.  Extremities there  are no clubbing, cyanosis, or edema.  Skin no petechia, purpura or ecchymosis Neurologic  is nonfocal.  Laboratory Data: White count 7.4, hemoglobin 7.2, hematocrit 23.1, platelets 570,000  Current Outpatient Prescriptions on File Prior to Visit  Medication Sig Dispense Refill  . amoxicillin-clavulanate (AUGMENTIN) 875-125 MG per tablet Take 1 tablet by mouth 2 (two) times daily.  20 tablet  0  . atorvastatin (LIPITOR) 80 MG tablet daily.       . benzonatate (TESSALON) 200 MG capsule Take 1 capsule (200 mg total) by mouth 3 (three) times daily as needed for cough.  20 capsule  0  . furosemide (LASIX) 20 MG tablet One by mouth in the morning as needed for swelling      . insulin glargine (LANTUS SOLOSTAR) 100 UNIT/ML injection Inject 50 Units into the skin at bedtime.  5 pen  6  . insulin lispro (HUMALOG) 100 UNIT/ML injection Inject 5 Units into the skin 3 (three) times daily before meals.  10 mL  12  . losartan (COZAAR) 50 MG tablet Take 1 tablet (50 mg total) by mouth daily.  30 tablet  6  . metFORMIN (GLUCOPHAGE) 500 MG tablet TAKE 1 TAB(S) 2 TIMES A DAY ORALLY  60 tablet  3  . methylPREDNISolone (MEDROL, PAK,) 4 MG tablet follow package directions  21 tablet  0  . NEXIUM 40 MG capsule Take 40 mg by mouth 2 (two) times daily.       . vardenafil (LEVITRA) 10 MG tablet Take 1 tablet (10 mg total) by mouth daily as needed for erectile dysfunction.  6 tablet  2  . varenicline (CHANTIX STARTING MONTH PAK) 0.5 MG X 11 & 1 MG X 42 tablet Take one 0.5 mg tablet by mouth once daily for 3 days, then increase to one 0.5 mg tablet twice daily for 4 days, then increase to one 1 mg tablet twice daily.  53 tablet  0  . VIGAMOX 0.5 % ophthalmic solution Place 1 drop into both eyes 3 (three) times daily.        Assessment/Plan: This is a pleasant, 66 year old white gentleman, with the following issues:  #1.  Iron deficiency anemia.  He last received IV iron.  Back in November.  He, more than likely has recurrent GI bleed.  He was recently seen by GI and had a capsule endoscopy, done.  We do not  have those results at the moment.  We will go ahead and give him IV iron today.  #2 recurrent gastrointestinal bleeding.  This has been an anterior maintenance issue with Mr. Lombardo.  We will try and obtain the results of the capsule endoscopy.  I also informed Mr. Brink to follow back up with his gastroenterologist.  #3.  Mr. Graefe will follow back up with [place]  in the next 2-3 weeks, but before then should there be questions or concerns.

## 2012-03-15 NOTE — Patient Instructions (Signed)
Ferumoxytol injection What is this medicine? FERUMOXYTOL is an iron complex. Iron is used to make healthy red blood cells, which carry oxygen and nutrients throughout the body. This medicine is used to treat iron deficiency anemia in people with chronic kidney disease. This medicine may be used for other purposes; ask your health care provider or pharmacist if you have questions. What should I tell my health care provider before I take this medicine? They need to know if you have any of these conditions: -anemia not caused by low iron levels -high levels of iron in the blood -magnetic resonance imaging (MRI) test scheduled -an unusual or allergic reaction to iron, other medicines, foods, dyes, or preservatives -pregnant or trying to get pregnant -breast-feeding How should I use this medicine? This medicine is for infusion into a vein. It is given by a health care professional in a hospital or clinic setting. Talk to your pediatrician regarding the use of this medicine in children. Special care may be needed. Overdosage: If you think you've taken too much of this medicine contact a poison control center or emergency room at once. Overdosage: If you think you have taken too much of this medicine contact a poison control center or emergency room at once. NOTE: This medicine is only for you. Do not share this medicine with others. What if I miss a dose? It is important not to miss your dose. Call your doctor or health care professional if you are unable to keep an appointment. What may interact with this medicine? This medicine may interact with the following medications: -other iron products This list may not describe all possible interactions. Give your health care provider a list of all the medicines, herbs, non-prescription drugs, or dietary supplements you use. Also tell them if you smoke, drink alcohol, or use illegal drugs. Some items may interact with your medicine. What should I watch  for while using this medicine? Visit your doctor or healthcare professional regularly. Tell your doctor or healthcare professional if your symptoms do not start to get better or if they get worse. You may need blood work done while you are taking this medicine. You may need to follow a special diet. Talk to your doctor. Foods that contain iron include: whole grains/cereals, dried fruits, beans, or peas, leafy green vegetables, and organ meats (liver, kidney). What side effects may I notice from receiving this medicine? Side effects that you should report to your doctor or health care professional as soon as possible: -allergic reactions like skin rash, itching or hives, swelling of the face, lips, or tongue -breathing problems -changes in blood pressure -feeling faint or lightheaded, falls -fever or chills -flushing, sweating, or hot feelings -swelling of the ankles or feet Side effects that usually do not require medical attention (Report these to your doctor or health care professional if they continue or are bothersome.): -diarrhea -headache -nausea, vomiting -stomach pain This list may not describe all possible side effects. Call your doctor for medical advice about side effects. You may report side effects to FDA at 1-800-FDA-1088. Where should I keep my medicine? This drug is given in a hospital or clinic and will not be stored at home. NOTE: This sheet is a summary. It may not cover all possible information. If you have questions about this medicine, talk to your doctor, pharmacist, or health care provider.  2012, Elsevier/Gold Standard. (11/24/2007 9:48:25 PM) 

## 2012-03-17 DIAGNOSIS — E1142 Type 2 diabetes mellitus with diabetic polyneuropathy: Secondary | ICD-10-CM

## 2012-03-17 HISTORY — DX: Type 2 diabetes mellitus with diabetic polyneuropathy: E11.42

## 2012-03-22 ENCOUNTER — Other Ambulatory Visit: Payer: Medicare Other | Admitting: Lab

## 2012-03-22 ENCOUNTER — Ambulatory Visit: Payer: Medicare Other | Admitting: Medical

## 2012-03-26 ENCOUNTER — Ambulatory Visit: Payer: Medicare Other | Admitting: Hematology & Oncology

## 2012-03-26 ENCOUNTER — Other Ambulatory Visit: Payer: Medicare Other | Admitting: Lab

## 2012-04-19 ENCOUNTER — Other Ambulatory Visit: Payer: Self-pay | Admitting: Internal Medicine

## 2012-04-22 ENCOUNTER — Ambulatory Visit: Payer: Medicare Other | Admitting: Hematology & Oncology

## 2012-04-22 ENCOUNTER — Other Ambulatory Visit: Payer: Medicare Other | Admitting: Lab

## 2012-04-23 ENCOUNTER — Ambulatory Visit (INDEPENDENT_AMBULATORY_CARE_PROVIDER_SITE_OTHER): Payer: Medicare Other | Admitting: Family

## 2012-04-23 ENCOUNTER — Encounter: Payer: Self-pay | Admitting: Family

## 2012-04-23 ENCOUNTER — Ambulatory Visit (HOSPITAL_BASED_OUTPATIENT_CLINIC_OR_DEPARTMENT_OTHER): Payer: Medicare Other

## 2012-04-23 ENCOUNTER — Ambulatory Visit (HOSPITAL_BASED_OUTPATIENT_CLINIC_OR_DEPARTMENT_OTHER): Payer: Medicare Other | Admitting: Hematology & Oncology

## 2012-04-23 ENCOUNTER — Other Ambulatory Visit (HOSPITAL_BASED_OUTPATIENT_CLINIC_OR_DEPARTMENT_OTHER): Payer: Medicare Other | Admitting: Lab

## 2012-04-23 VITALS — BP 140/80 | HR 89 | Temp 97.7°F | Resp 16 | Ht 67.01 in | Wt 160.1 lb

## 2012-04-23 VITALS — BP 128/76 | HR 88 | Temp 97.6°F | Resp 20

## 2012-04-23 VITALS — BP 151/66 | HR 97 | Temp 98.2°F | Resp 18 | Ht 67.0 in | Wt 160.0 lb

## 2012-04-23 DIAGNOSIS — K922 Gastrointestinal hemorrhage, unspecified: Secondary | ICD-10-CM

## 2012-04-23 DIAGNOSIS — D509 Iron deficiency anemia, unspecified: Secondary | ICD-10-CM

## 2012-04-23 DIAGNOSIS — D500 Iron deficiency anemia secondary to blood loss (chronic): Secondary | ICD-10-CM

## 2012-04-23 DIAGNOSIS — E1165 Type 2 diabetes mellitus with hyperglycemia: Secondary | ICD-10-CM

## 2012-04-23 DIAGNOSIS — N289 Disorder of kidney and ureter, unspecified: Secondary | ICD-10-CM

## 2012-04-23 DIAGNOSIS — D631 Anemia in chronic kidney disease: Secondary | ICD-10-CM

## 2012-04-23 DIAGNOSIS — E119 Type 2 diabetes mellitus without complications: Secondary | ICD-10-CM

## 2012-04-23 LAB — CBC WITH DIFFERENTIAL (CANCER CENTER ONLY)
BASO#: 0 10*3/uL (ref 0.0–0.2)
BASO%: 0.4 % (ref 0.0–2.0)
HCT: 28.4 % — ABNORMAL LOW (ref 38.7–49.9)
HGB: 9.3 g/dL — ABNORMAL LOW (ref 13.0–17.1)
LYMPH#: 0.9 10*3/uL (ref 0.9–3.3)
LYMPH%: 11.9 % — ABNORMAL LOW (ref 14.0–48.0)
MCV: 85 fL (ref 82–98)
MONO#: 0.8 10*3/uL (ref 0.1–0.9)
NEUT%: 75.1 % (ref 40.0–80.0)
RDW: 14.6 % (ref 11.1–15.7)
WBC: 7.9 10*3/uL (ref 4.0–10.0)

## 2012-04-23 LAB — IRON AND TIBC
%SAT: 5 % — ABNORMAL LOW (ref 20–55)
TIBC: 286 ug/dL (ref 215–435)
UIBC: 272 ug/dL (ref 125–400)

## 2012-04-23 LAB — CHCC SATELLITE - SMEAR

## 2012-04-23 MED ORDER — "INSULIN SYRINGE 31G X 5/16"" 1 ML MISC": Status: DC

## 2012-04-23 MED ORDER — SODIUM CHLORIDE 0.9 % IV SOLN
1020.0000 mg | Freq: Once | INTRAVENOUS | Status: AC
  Administered 2012-04-23: 1020 mg via INTRAVENOUS
  Filled 2012-04-23: qty 34

## 2012-04-23 MED ORDER — SODIUM CHLORIDE 0.9 % IV SOLN
Freq: Once | INTRAVENOUS | Status: AC
  Administered 2012-04-23: 12:00:00 via INTRAVENOUS

## 2012-04-23 NOTE — Progress Notes (Signed)
This office note has been dictated.

## 2012-04-23 NOTE — Assessment & Plan Note (Signed)
Eye exam up to date. DM remains uncontrolled.  Recommended that he increase the lantus from 50 units to 55 units and will place humalog on a sliding scale.  Titrate lantus up as detailed in AVS. Follow up in 1 month.

## 2012-04-23 NOTE — Patient Instructions (Addendum)
sliding scale- check sugar and inject 3 times daily before meals as below:  <150-   Zero units 150-200 2 units 201-250 4 units 251-300 6 units 301-350 8 units 351-400 10 units >400             12 units and contact us.  Increase lantus from 50 units to 55 units.  Then you may increase by 2 units every 3 days until your fasting sugar is <110, then continue on that dose until you see [place] back in the office.    Please schedule a follow up appointment in 1 month.

## 2012-04-23 NOTE — Assessment & Plan Note (Signed)
Sounds like he was having some GI loss per pt's note of Endo findings.  I do not currently have report available.  He had follow up cbc draw today at cancer center and his hemoglobin has increased from 7.2 to 9.3.

## 2012-04-23 NOTE — Patient Instructions (Signed)
Ferumoxytol injection What is this medicine? FERUMOXYTOL is an iron complex. Iron is used to make healthy red blood cells, which carry oxygen and nutrients throughout the body. This medicine is used to treat iron deficiency anemia in people with chronic kidney disease. This medicine may be used for other purposes; ask your health care provider or pharmacist if you have questions. What should I tell my health care provider before I take this medicine? They need to know if you have any of these conditions: -anemia not caused by low iron levels -high levels of iron in the blood -magnetic resonance imaging (MRI) test scheduled -an unusual or allergic reaction to iron, other medicines, foods, dyes, or preservatives -pregnant or trying to get pregnant -breast-feeding How should I use this medicine? This medicine is for infusion into a vein. It is given by a health care professional in a hospital or clinic setting. Talk to your pediatrician regarding the use of this medicine in children. Special care may be needed. Overdosage: If you think you've taken too much of this medicine contact a poison control center or emergency room at once. Overdosage: If you think you have taken too much of this medicine contact a poison control center or emergency room at once. NOTE: This medicine is only for you. Do not share this medicine with others. What if I miss a dose? It is important not to miss your dose. Call your doctor or health care professional if you are unable to keep an appointment. What may interact with this medicine? This medicine may interact with the following medications: -other iron products This list may not describe all possible interactions. Give your health care provider a list of all the medicines, herbs, non-prescription drugs, or dietary supplements you use. Also tell them if you smoke, drink alcohol, or use illegal drugs. Some items may interact with your medicine. What should I watch  for while using this medicine? Visit your doctor or healthcare professional regularly. Tell your doctor or healthcare professional if your symptoms do not start to get better or if they get worse. You may need blood work done while you are taking this medicine. You may need to follow a special diet. Talk to your doctor. Foods that contain iron include: whole grains/cereals, dried fruits, beans, or peas, leafy green vegetables, and organ meats (liver, kidney). What side effects may I notice from receiving this medicine? Side effects that you should report to your doctor or health care professional as soon as possible: -allergic reactions like skin rash, itching or hives, swelling of the face, lips, or tongue -breathing problems -changes in blood pressure -feeling faint or lightheaded, falls -fever or chills -flushing, sweating, or hot feelings -swelling of the ankles or feet Side effects that usually do not require medical attention (Report these to your doctor or health care professional if they continue or are bothersome.): -diarrhea -headache -nausea, vomiting -stomach pain This list may not describe all possible side effects. Call your doctor for medical advice about side effects. You may report side effects to FDA at 1-800-FDA-1088. Where should I keep my medicine? This drug is given in a hospital or clinic and will not be stored at home. NOTE: This sheet is a summary. It may not cover all possible information. If you have questions about this medicine, talk to your doctor, pharmacist, or health care provider.  2012, Elsevier/Gold Standard. (11/24/2007 9:48:25 PM) 

## 2012-04-23 NOTE — Progress Notes (Signed)
Subjective:    Patient ID: [name], male    DOB: 1945/09/06, 67 y.o.   MRN: [id number]  HPI  [name] is a 67 yr old male who presents today for follow up.  1) HTN- Pt is only on furosemide currently.   2) DM2-last A1C 9.  He reports that his sugars are "too high."  Reports that he has been taking 50 units at bedtime.  He reports fasting sugar this AM was 232.  Post prandial sugars are as high as 400. He takes 5 units humalog AC meals.    3) Anemia-  Had lab draw on 12/30 and hgb was 7- he subsequently received Iron infusion.  Denies black/blood stools.  He reports that he had endo yesterday with Dr. Irven Easterly and was told that he had some ulcers which were cauterized.  He reports recent NSAID use which he just stopped at the direction of Dr. Vernell Barrier.   Review of Systems    see HPI  Past Medical History  Diagnosis Date  . Anemia, iron deficiency 03/24/2011  . 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 Hypertension   . Diabetes mellitus   . High cholesterol   . GERD (gastroesophageal reflux disease)   . Neuropathy   . Bronchitis 03/06/2012    History   Social History  . Marital Status: Married    Spouse Name: N/A    Number of Children: N/A  . Years of Education: N/A   Occupational History  . Not on file.   Social History Main Topics  . Smoking status: Former Smoker    Quit date: 03/17/2012  . Smokeless tobacco: Not on file  . Alcohol Use: No  . Drug Use: No  . Sexually Active: Not on file   Other Topics Concern  . Not on file   Social History Narrative  . No narrative on file    Past Surgical History  Procedure Date  . Back surgery   . Rotator cuff repair     No family history on file.  No Known Allergies  Current Outpatient Prescriptions on File Prior to Visit  Medication Sig Dispense Refill  . atorvastatin (LIPITOR) 80 MG tablet TAKE 1 TABLET BY MOUTH AT BEDTIME  30 tablet  0  . furosemide (LASIX) 20 MG tablet One by mouth in the morning as needed for  swelling      . insulin glargine (LANTUS SOLOSTAR) 100 UNIT/ML injection Inject 50 Units into the skin at bedtime.  5 pen  6  . insulin lispro (HUMALOG) 100 UNIT/ML injection Inject 5 Units into the skin 3 (three) times daily before meals.  10 mL  12  . metFORMIN (GLUCOPHAGE) 500 MG tablet TAKE 1 TAB(S) 2 TIMES A DAY ORALLY  60 tablet  3  . NEXIUM 40 MG capsule Take 40 mg by mouth 2 (two) times daily.       . vardenafil (LEVITRA) 10 MG tablet Take 1 tablet (10 mg total) by mouth daily as needed for erectile dysfunction.  6 tablet  2  . varenicline (CHANTIX STARTING MONTH PAK) 0.5 MG X 11 & 1 MG X 42 tablet Take one 0.5 mg tablet by mouth once daily for 3 days, then increase to one 0.5 mg tablet twice daily for 4 days, then increase to one 1 mg tablet twice daily.  53 tablet  0  . VIGAMOX 0.5 % ophthalmic solution Place 1 drop into both eyes 3 (three) times daily.  No current facility-administered medications on file prior to visit.    BP 140/80  Pulse 89  Temp 97.7 F (36.5 C) (Oral)  Resp 16  Ht 5' 7.01" (1.702 m)  Wt 160 lb 1.3 oz (72.612 kg)  BMI 25.07 kg/m2  SpO2 99%    Objective:   Physical Exam  Constitutional: He is oriented to person, place, and time. He appears well-developed and well-nourished. No distress.  Cardiovascular: Normal rate and regular rhythm.   No murmur heard. Pulmonary/Chest: Effort normal and breath sounds normal. No respiratory distress. He has no wheezes. He has no rales. He exhibits no tenderness.  Musculoskeletal: He exhibits no edema.  Neurological: He is alert and oriented to person, place, and time.  Psychiatric: He has a normal mood and affect. His behavior is normal. Judgment and thought content normal.          Assessment & Plan:

## 2012-04-24 NOTE — Progress Notes (Signed)
CC:   Marguarite Arbour, MD  DIAGNOSES: 1. 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 2. Renal insufficiency. 3. Poorly controlled diabetes.  CURRENT THERAPY: 1. IV iron as indicated. 2. Aranesp 300 mcg subcu as needed for hemoglobin less than 10.  INTERIM HISTORY:  Mr. Timberman comes in for followup.  He was seen by Gastroenterology.  He had an upper endoscopy.  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  I am sure he probably has other AVMs.  He has had no obvious melena.  His last iron was given back in December.  He has had no fevers.  He has had no abdominal pain.  Again, his blood sugars are poorly controlled.  I think this is because of his own preference.  He has had no nausea or vomiting.  He has had no leg swelling.  PHYSICAL EXAMINATION:  This is a well-developed, well-nourished black gentleman in no obvious distress.  Vital signs:  Temperature of 98.2, pulse 97, respiratory rate 18, blood pressure 151/66.  Weight is 160. Head/neck:  Normocephalic, atraumatic skull.  There are no ocular or oral lesions.  There are no palpable cervical or supraclavicular lymph nodes.  Lungs:  Clear bilaterally.  Cardiac:  Regular rate and rhythm with a normal S1 and S2.  There are no murmurs, rubs or bruits. Abdomen:  Soft with good bowel sounds.  There is no palpable abdominal mass.  There is no fluid wave.  There is no palpable hepatosplenomegaly. Extremities:  No clubbing, cyanosis or edema.  Neurological:  No focal neurological deficits.  LABORATORY STUDIES:  White cell count 7.9, hemoglobin 9.3, hematocrit 28.4, platelet count 382.  MCV is 85.  IMPRESSION:  Mr. Gatley is a 67 year old gentleman with recurrent gastrointestinal blood loss.  He requires iron every couple of months.  We will go ahead and give him a dose of iron today.  We will plan to get him back in about 2 months.  We will hopefully find that his  bleeding will slow down.  ADDENDUM:  I told Mr. Capote that he really needed to get his blood sugars under much better control.  I think this will cause him much more issues long term than iron deficiency.    ______________________________ [name], M.D. PRE/MEDQ  D:  04/23/2012  T:  04/24/2012  Job:  [id number]

## 2012-05-21 ENCOUNTER — Ambulatory Visit: Payer: Medicare Other | Admitting: Family

## 2012-05-24 ENCOUNTER — Ambulatory Visit (INDEPENDENT_AMBULATORY_CARE_PROVIDER_SITE_OTHER): Payer: Medicare Other | Admitting: Family

## 2012-05-24 ENCOUNTER — Encounter: Payer: Self-pay | Admitting: Family

## 2012-05-24 VITALS — BP 124/81 | HR 109 | Temp 97.6°F | Resp 16 | Ht 67.0 in | Wt 161.0 lb

## 2012-05-24 DIAGNOSIS — IMO0001 Reserved for inherently not codable concepts without codable children: Secondary | ICD-10-CM

## 2012-05-24 DIAGNOSIS — D649 Anemia, unspecified: Secondary | ICD-10-CM

## 2012-05-24 DIAGNOSIS — R319 Hematuria, unspecified: Secondary | ICD-10-CM

## 2012-05-24 DIAGNOSIS — B353 Tinea pedis: Secondary | ICD-10-CM

## 2012-05-24 DIAGNOSIS — I10 Essential (primary) hypertension: Secondary | ICD-10-CM

## 2012-05-24 DIAGNOSIS — E785 Hyperlipidemia, unspecified: Secondary | ICD-10-CM

## 2012-05-24 DIAGNOSIS — R350 Frequency of micturition: Secondary | ICD-10-CM

## 2012-05-24 DIAGNOSIS — IMO0002 Reserved for concepts with insufficient information to code with codable children: Secondary | ICD-10-CM

## 2012-05-24 DIAGNOSIS — M79609 Pain in unspecified limb: Secondary | ICD-10-CM

## 2012-05-24 DIAGNOSIS — E1165 Type 2 diabetes mellitus with hyperglycemia: Secondary | ICD-10-CM

## 2012-05-24 HISTORY — DX: Tinea pedis: B35.3

## 2012-05-24 LAB — CBC WITH DIFFERENTIAL/PLATELET
Eosinophils Relative: 8 % — ABNORMAL HIGH (ref 0–5)
HCT: 36.7 % — ABNORMAL LOW (ref 39.0–52.0)
Hemoglobin: 12.7 g/dL — ABNORMAL LOW (ref 13.0–17.0)
Lymphocytes Relative: 25 % (ref 12–46)
Lymphs Abs: 1.3 10*3/uL (ref 0.7–4.0)
MCV: 85.9 fL (ref 78.0–100.0)
Monocytes Absolute: 0.6 10*3/uL (ref 0.1–1.0)
Monocytes Relative: 11 % (ref 3–12)
RBC: 4.27 MIL/uL (ref 4.22–5.81)
WBC: 5.3 10*3/uL (ref 4.0–10.5)

## 2012-05-24 LAB — POCT URINALYSIS DIPSTICK
Bilirubin, UA: NEGATIVE
Ketones, UA: NEGATIVE
Protein, UA: 2000
Spec Grav, UA: 1.025
pH, UA: 6

## 2012-05-24 MED ORDER — CIPROFLOXACIN HCL 500 MG PO TABS: 500.0000 mg | ORAL_TABLET | Freq: Two times a day (BID) | ORAL | Status: DC

## 2012-05-24 MED ORDER — FLUTICASONE PROPIONATE 50 MCG/ACT NA SUSP: 2.0000 | Freq: Every day | NASAL | Status: DC

## 2012-05-24 NOTE — Assessment & Plan Note (Signed)
Recommended otc lotrimin BID.

## 2012-05-24 NOTE — Progress Notes (Signed)
Subjective:    Patient ID: [name], male    DOB: December 18, 1945, 67 y.o.   MRN: [id number]  HPI  [name] is a 67 yr old male who presents today for follow up.  1) DM2-  Last visit he described hyperglycemia.  His novolog was placed on a sliding scale and he was instructed to titrate up his lantus based on AM fasting blood sugars. Reports that he feels like his sugar drops when he uses novolog.    2) Urinary frequency- reports that this has been going on for 3 weeks.  He denies dysuria.  He denies fevers, low back pain.   3) Dizziness- reports that sometimes he feels like he loses his balance.  This has been happening x 2 weeks off/on. Reports that he had a cold 3-4 wks ago. Was treated with zithromax. Got better.  He reports some hoarseness.   4) Foot pain -He reports that he has pain both feet at the base of the great toe L>R    Review of Systems See HPI  Past Medical History  Diagnosis Date  . Anemia, iron deficiency 03/24/2011  . 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 Hypertension   . Diabetes mellitus   . High cholesterol   . GERD (gastroesophageal reflux disease)   . Neuropathy   . Bronchitis 03/06/2012    History   Social History  . Marital Status: Married    Spouse Name: N/A    Number of Children: N/A  . Years of Education: N/A   Occupational History  . Not on file.   Social History Main Topics  . Smoking status: Former Smoker    Quit date: 03/17/2012  . Smokeless tobacco: Not on file  . Alcohol Use: No  . Drug Use: No  . Sexually Active: Not on file   Other Topics Concern  . Not on file   Social History Narrative  . No narrative on file    Past Surgical History  Procedure Laterality Date  . Back surgery    . Rotator cuff repair      No family history on file.  No Known Allergies  Current Outpatient Prescriptions on File Prior to Visit  Medication Sig Dispense Refill  . atorvastatin (LIPITOR) 80 MG tablet TAKE 1 TABLET BY MOUTH AT BEDTIME   30 tablet  0  . furosemide (LASIX) 20 MG tablet One by mouth in the morning as needed for swelling      . insulin glargine (LANTUS SOLOSTAR) 100 UNIT/ML injection Inject 50 Units into the skin at bedtime.  5 pen  6  . insulin lispro (HUMALOG) 100 UNIT/ML injection Inject 5 Units into the skin 3 (three) times daily before meals.  10 mL  12  . Insulin Syringe-Needle U-100 (INSULIN SYRINGE 1CC/31GX5/16") 31G X 5/16" 1 ML MISC Use with insulin injection three times daily.  100 each  3  . metFORMIN (GLUCOPHAGE) 500 MG tablet TAKE 1 TAB(S) 2 TIMES A DAY ORALLY  60 tablet  3  . NEXIUM 40 MG capsule Take 40 mg by mouth 2 (two) times daily.       . vardenafil (LEVITRA) 10 MG tablet Take 1 tablet (10 mg total) by mouth daily as needed for erectile dysfunction.  6 tablet  2  . VIGAMOX 0.5 % ophthalmic solution Place 1 drop into both eyes 3 (three) times daily.        No current facility-administered medications on file prior to visit.  BP 124/81  Pulse 109  Temp(Src) 97.6 F (36.4 C) (Oral)  Resp 16  Ht 5\' 7"  (1.702 m)  Wt 161 lb 0.6 oz (73.047 kg)  BMI 25.22 kg/m2  SpO2 99%       Objective:   Physical Exam  Constitutional: He is oriented to person, place, and time. He appears well-developed and well-nourished.  HENT:  Head: Normocephalic and atraumatic.  Right Ear: Tympanic membrane and ear canal normal.  Left Ear: Tympanic membrane and ear canal normal.  Mouth/Throat: No posterior oropharyngeal edema or posterior oropharyngeal erythema.  Cardiovascular: Normal rate and regular rhythm.   No murmur heard. Pulmonary/Chest: Breath sounds normal. No respiratory distress. He has no wheezes. He has no rales. He exhibits no tenderness.  Musculoskeletal: He exhibits no edema.  Tenderness with flex/extension of bilateral great toes.   Neurological: He is alert and oriented to person, place, and time.  Skin: Skin is warm and dry.  Psychiatric: He has a normal mood and affect. His behavior is  normal. Judgment and thought content normal.          Assessment & Plan:

## 2012-05-24 NOTE — Assessment & Plan Note (Signed)
Will plan rx with cipro empirically.  Send urine for culture.  Repeat UA in 1 month at his follow up.  If + for blood plan referral for urology.

## 2012-05-24 NOTE — Assessment & Plan Note (Addendum)
BP is stable.  Is is not on antihypertensive meds.  He could benefit from ACE or ARB, but with orthostasis I do not think his BP could tolerate at this time as he is mildly orthostatic at this time. SBP 132 to 109 sitting to standing. Recommended that he drink plenty of fluids and try to rise slowly.  Will plan to repeat orthostatics next visit.

## 2012-05-24 NOTE — Assessment & Plan Note (Signed)
Per pt report- Blood sugars are improving.  He did not titrate up his lantus as we had discussed.  I have asked him to do so.  Consider addition of aspirin next visit.

## 2012-05-24 NOTE — Patient Instructions (Addendum)
Complete lab work prior to leaving.   Apply Lotrimin twice daily to your feet for athlete's foot.  Diabetes:  Humalog sliding scale- check sugar and inject 3 times daily before meals as below:  <150- Zero units  150-200 2 units  201-250 4 units  251-300 6 units  301-350 8 units  351-400 10 units  >400 12 units and contact us.   Increase lantus from 50 units to 55 units. Then you may increase by 2 units every 3 days until your fasting sugar is <110, then continue on that dose until you see [place] back in the office.   Follow up in 6 weeks for your diabetes.

## 2012-05-25 LAB — MICROALBUMIN / CREATININE URINE RATIO: Microalb, Ur: >600 mg/dL — ABNORMAL HIGH (ref 0.00–1.89)

## 2012-05-25 LAB — BASIC METABOLIC PANEL
Glucose, Bld: 196 mg/dL — ABNORMAL HIGH (ref 70–99)
Potassium: 3.5 mEq/L (ref 3.5–5.3)
Sodium: 139 mEq/L (ref 135–145)

## 2012-05-25 LAB — HEPATIC FUNCTION PANEL
ALT: 14 U/L (ref 0–53)
Alkaline Phosphatase: 110 U/L (ref 39–117)
Bilirubin, Direct: 0.1 mg/dL (ref 0.0–0.3)
Indirect Bilirubin: 0.3 mg/dL (ref 0.0–0.9)
Total Protein: 6.3 g/dL (ref 6.0–8.3)

## 2012-05-25 LAB — HEMOGLOBIN A1C: Mean Plasma Glucose: 217 mg/dL — ABNORMAL HIGH (ref <117)

## 2012-05-26 ENCOUNTER — Telehealth: Payer: Self-pay | Admitting: Family

## 2012-05-26 DIAGNOSIS — R809 Proteinuria, unspecified: Secondary | ICD-10-CM

## 2012-05-26 LAB — URINE CULTURE
Colony Count: NO GROWTH
Organism ID, Bacteria: NO GROWTH

## 2012-05-26 NOTE — Telephone Encounter (Signed)
Pls call pt and let him know that there is a significant amount of protein in his urine.  I would like him to complete a 24 hour urine collection to further evaluate.

## 2012-05-27 NOTE — Telephone Encounter (Signed)
LMOM with contact name & number for return call RE: results & further provider instructions/SLS 

## 2012-05-28 NOTE — Telephone Encounter (Signed)
Patient informed, understood & agreed; will come to lab & p/u specimen container/SLS

## 2012-06-01 ENCOUNTER — Telehealth: Payer: Self-pay | Admitting: *Deleted

## 2012-06-01 NOTE — Telephone Encounter (Signed)
Received fax from [facility] For authorization for a topical compounded cream to treat diabetic neuropathy (meloxicam, topiramate, tizanidine, lidocaine).  Spoke with pt and verified that he requested this medication. States that he has a lot of pain in his feet and mentioned this at his last office visit. Order forwarded to Provider for signature.

## 2012-06-02 NOTE — Telephone Encounter (Signed)
Signed.

## 2012-06-02 NOTE — Addendum Note (Signed)
Addended by: Mervin Kung A on: 06/02/2012 11:03 AM   Modules accepted: Orders

## 2012-06-03 ENCOUNTER — Other Ambulatory Visit: Payer: Self-pay | Admitting: Family

## 2012-06-03 NOTE — Telephone Encounter (Signed)
Form faxed to [phone] at 2:30pm.

## 2012-06-04 ENCOUNTER — Telehealth: Payer: Self-pay | Admitting: Hematology & Oncology

## 2012-06-04 ENCOUNTER — Ambulatory Visit: Payer: Medicare Other | Admitting: Medical

## 2012-06-04 ENCOUNTER — Other Ambulatory Visit: Payer: Medicare Other | Admitting: Lab

## 2012-06-04 NOTE — Telephone Encounter (Signed)
Rx request to pharmacy/SLS  

## 2012-06-04 NOTE — Telephone Encounter (Signed)
Patient called and cx 06/04/12 apt due to not feeling well.  He stated he will call back to resch

## 2012-06-07 ENCOUNTER — Encounter: Payer: Self-pay | Admitting: Hematology & Oncology

## 2012-06-08 ENCOUNTER — Telehealth: Payer: Self-pay | Admitting: *Deleted

## 2012-06-08 NOTE — Telephone Encounter (Signed)
Received fax from [facility], [facility] requesting order for diabetic testing supplies. Form completed and faxed to [phone].

## 2012-06-10 ENCOUNTER — Telehealth: Payer: Self-pay | Admitting: Hematology & Oncology

## 2012-06-10 NOTE — Telephone Encounter (Signed)
Patient called and resch 06/04/12 missed apt for 06/15/12

## 2012-06-15 ENCOUNTER — Other Ambulatory Visit (HOSPITAL_BASED_OUTPATIENT_CLINIC_OR_DEPARTMENT_OTHER): Payer: Medicare Other | Admitting: Lab

## 2012-06-15 ENCOUNTER — Ambulatory Visit (HOSPITAL_BASED_OUTPATIENT_CLINIC_OR_DEPARTMENT_OTHER): Payer: Medicare Other | Admitting: Medical

## 2012-06-15 VITALS — BP 142/71 | HR 95 | Temp 98.0°F | Resp 18 | Ht 67.0 in | Wt 164.0 lb

## 2012-06-15 DIAGNOSIS — D509 Iron deficiency anemia, unspecified: Secondary | ICD-10-CM

## 2012-06-15 DIAGNOSIS — H6690 Otitis media, unspecified, unspecified ear: Secondary | ICD-10-CM

## 2012-06-15 DIAGNOSIS — D500 Iron deficiency anemia secondary to blood loss (chronic): Secondary | ICD-10-CM

## 2012-06-15 DIAGNOSIS — D631 Anemia in chronic kidney disease: Secondary | ICD-10-CM

## 2012-06-15 LAB — CBC WITH DIFFERENTIAL (CANCER CENTER ONLY)
Eosinophils Absolute: 0.3 10*3/uL (ref 0.0–0.5)
LYMPH#: 1.3 10*3/uL (ref 0.9–3.3)
MONO#: 0.8 10*3/uL (ref 0.1–0.9)
NEUT#: 2.7 10*3/uL (ref 1.5–6.5)
Platelets: 291 10*3/uL (ref 145–400)
RBC: 4 10*6/uL — ABNORMAL LOW (ref 4.20–5.70)
WBC: 5.1 10*3/uL (ref 4.0–10.0)

## 2012-06-15 LAB — IRON AND TIBC
%SAT: 15 % — ABNORMAL LOW (ref 20–55)
Iron: 37 ug/dL — ABNORMAL LOW (ref 42–165)
TIBC: 254 ug/dL (ref 215–435)

## 2012-06-15 LAB — CHCC SATELLITE - SMEAR

## 2012-06-15 MED ORDER — AMOXICILLIN ER 775 MG PO TB24: 775.0000 mg | ORAL_TABLET | Freq: Every day | ORAL | Status: DC

## 2012-06-15 NOTE — Progress Notes (Signed)
DIAGNOSES: 1. 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 2. Renal insufficiency. 3. Poorly controlled diabetes.  CURRENT THERAPY: 1. IV iron as indicated. 2. Aranesp 300 mcg subcu as needed for hemoglobin less than 10.  INTERIM HISTORY: [name] presents today for an office followup visit.  Overall, he, reports, that he is doing relatively well.  Again, he was seen by gastroenterology and had an upper endoscopy, done.  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  He does not report any obvious, melena.  Last, time, he received, IV iron was back in February.  At that time, his iron was 14, with 5% saturation.  His ferritin was 40.  He still continues to have poorly controlled blood sugars.  Most recently, he, reports, of bilateral, ear pain.  He, also reports some dizziness associated with the ear pain.  He does not report any obvious, ear drainage.  He, otherwise, reports, a decent, appetite.  He denies any nausea, vomiting, diarrhea, constipation, chest pain, shortness of breath, or cough.  He denies any fevers, chills, or night sweats.  He denies any lower leg swelling, any obvious, or abnormal bleeding.  He denies any headaches, visual changes, or rashes  Review of Systems: Constitutional:Negative for malaise/fatigue, fever, chills, weight loss, diaphoresis, activity change, appetite change, and unexpected weight change.  HEENT: Negative for double vision, blurred vision, visual loss,, tinnitus, congestion, rhinorrhea, epistaxis sore throat or sinus disease, oral pain/lesion, tongue soreness. Positive for ear pain. Respiratory: Negative for cough, chest tightness, shortness of breath, wheezing and stridor.  Cardiovascular: Negative for chest pain, palpitations, leg swelling, orthopnea, PND, DOE or claudication Gastrointestinal: Negative for nausea, vomiting, abdominal pain, diarrhea, constipation, blood in stool, melena, hematochezia,  abdominal distention, anal bleeding, rectal pain, anorexia and hematemesis.  Genitourinary: Negative for dysuria, frequency, hematuria,  Musculoskeletal: Negative for myalgias, back pain, joint swelling, arthralgias and gait problem.  Skin: Negative for rash, color change, pallor and wound.  Neurological:. Negative for dizziness/light-headedness, tremors, seizures, syncope, facial asymmetry, speech difficulty, weakness, numbness, headaches and paresthesias.  Hematological: Negative for adenopathy. Does not bruise/bleed easily.  Psychiatric/Behavioral:  Negative for depression, no loss of interest in normal activity or change in sleep pattern.   Physical Exam: This is a 67 year old, well-developed, well-nourished, black gentleman, in no obvious distress Vitals: Temperature 98.0 degrees, pulse 94, respirations 18, blood pressure 142/71, weight 164 pounds HEENT reveals a normocephalic, atraumatic skull, no scleral icterus, no oral lesions.  Bilateral, ear exam revealed purulent, middle ear fluid, and decreased mobility of the tympanic membrane.  There is no light reflex bilaterally. Neck is supple without any cervical or supraclavicular adenopathy.  Lungs are clear to auscultation bilaterally. There are no wheezes, rales or rhonci Cardiac is regular rate and rhythm with a normal S1 and S2. There are no murmurs, rubs, or bruits.  Abdomen is soft with good bowel sounds, there is no palpable mass. There is no palpable hepatosplenomegaly. There is no palpable fluid wave.  Musculoskeletal no tenderness of the spine, ribs, or hips.  Extremities there are no clubbing, cyanosis, or edema.  Skin no petechia, purpura or ecchymosis Neurologic is nonfocal.  Laboratory Data: White count 5.1, hemoglobin 11.7, hematocrit 34.0, MCV 85.  Platelets 291,000  Current Outpatient Prescriptions on File Prior to Visit  Medication Sig Dispense Refill  . atorvastatin (LIPITOR) 80 MG tablet TAKE 1 TABLET BY MOUTH AT  BEDTIME  30 tablet  2  . fluticasone (  FLONASE) 50 MCG/ACT nasal spray Place 2 sprays into the nose daily.  16 g  6  . furosemide (LASIX) 20 MG tablet One by mouth in the morning as needed for swelling      . insulin glargine (LANTUS SOLOSTAR) 100 UNIT/ML injection Inject 50 Units into the skin at bedtime.  5 pen  6  . insulin lispro (HUMALOG) 100 UNIT/ML injection Inject 5 Units into the skin 3 (three) times daily before meals.  10 mL  12  . Insulin Syringe-Needle U-100 (INSULIN SYRINGE 1CC/31GX5/16") 31G X 5/16" 1 ML MISC Use with insulin injection three times daily.  100 each  3  . metFORMIN (GLUCOPHAGE) 500 MG tablet TAKE 1 TAB(S) 2 TIMES A DAY ORALLY  60 tablet  3  . NEXIUM 40 MG capsule Take 40 mg by mouth 2 (two) times daily.       . NONFORMULARY OR COMPOUNDED ITEM meloxicam 0.9%, topriamate 3%, tizanidine 0.2%, lidocaine 3% apply to feet 3-4x daily prn      . vardenafil (LEVITRA) 10 MG tablet Take 1 tablet (10 mg total) by mouth daily as needed for erectile dysfunction.  6 tablet  2  . VIGAMOX 0.5 % ophthalmic solution Place 1 drop into both eyes 3 (three) times daily.        No current facility-administered medications on file prior to visit.   Assessment/Plan: This is a pleasant, 67 year old, white gentleman, with the following issues:  #1.  Recurrent gastrointestinal blood loss.  He does require iron every couple of months.  Again, the last, time, he received, IV iron was back in February.  We are checking another iron panel on him.  Today.  #2.  Otitis media.  I did go ahead and give him a prescription for high-dose amoxicillin and told him to followup with his primary care physician.  He may need a referral to ENT.  #3.  Followup.  [name] will follow back up with [place] in 2 months, but before then should there be questions or concerns.

## 2012-06-16 ENCOUNTER — Telehealth: Payer: Self-pay | Admitting: Family

## 2012-06-16 ENCOUNTER — Telehealth: Payer: Self-pay | Admitting: *Deleted

## 2012-06-16 MED ORDER — AMOXICILLIN 500 MG PO CAPS: 500.0000 mg | ORAL_CAPSULE | Freq: Three times a day (TID) | ORAL | Status: DC

## 2012-06-16 NOTE — Telephone Encounter (Signed)
Rx sent for generic amoxicillin.  Attempted to reach wife at number listed.  No answer- unable to leave message. Left message on home phone re: rx sent for generic amoxicillin and to call if questions.

## 2012-06-16 NOTE — Telephone Encounter (Signed)
Patient Information:  Caller Name: Margaretha Glassing  Phone: [phone]  Patient: [name], [name]  Gender: Male  DOB: 27-Aug-1945  Age: 67 Years  PCP: Sandford Craze (Adults only)  Office Follow Up:  Does the office need to follow up with this patient?: Yes  Instructions For The Office: PLS READ RN NOTE  RN Note:  Pt was seen by Oncologist on 4-1, diagnosed w/ Otitis Media, Per Epic, Eunice Blase, PA w/ Oncology wrote Moxatag (Amoxicillin) 775mg  24 hr tablet #7, no refills.  [facility] Wife RX is $95.00.  Wife would like to have less costly RX.  Wife called prescribing Oncologist, Per EPIC also, PA advised Pt to follow up w/ PCP.  Pt is not w/ Wife for full assessment. [facility] [place], info in [place]. PLEASE REVIEW W/ NP OR MD AND FOLLOW UP W/ WIFE FOR NEW RX REQUEST.  Symptoms  Reason For Call & Symptoms: Ear Infection F/U  Reviewed Health History In EMR: Yes  Reviewed Medications In EMR: Yes  Reviewed Allergies In EMR: Yes  Reviewed Surgeries / Procedures: Yes  Date of Onset of Symptoms: 06/15/2012  Guideline(s) Used:  No Protocol Available - Information Only  Disposition Per Guideline:   Discuss with PCP and Callback by Nurse Today  Reason For Disposition Reached:   Nursing judgment  Advice Given:  N/A  Patient Will Follow Care Advice:  YES

## 2012-06-16 NOTE — Telephone Encounter (Signed)
Received a call from [facility] regarding his Moxatag. It is too expensive and he wants to know if it can be changed to something cheaper. Reviewed with Eunice Blase, PA. Needs to contact PCP because his ear(s) looked very infected and may need to be referred to ENT. She gave him the rx as a courtesy while he was here. We follow him for iron deficiency anemia.

## 2012-06-16 NOTE — Telephone Encounter (Signed)
See documentation in additional phone note of 06/16/12.

## 2012-06-16 NOTE — Telephone Encounter (Signed)
Patient states that he was seen yesterday at the [facility]. He told the doctor there about his ears hurting him and the dr wrote him a prescription(does not know the name of the medication). He says that medicine is too expensive and wants to know if we could call in something cheaper.

## 2012-06-17 ENCOUNTER — Telehealth: Payer: Self-pay | Admitting: *Deleted

## 2012-06-17 NOTE — Telephone Encounter (Signed)
Called patient to let him know that his iron levels are ok per dr. Myna Hidalgo

## 2012-06-17 NOTE — Telephone Encounter (Signed)
Message copied by Anselm Jungling on Thu Jun 17, 2012  2:23 PM ------      Message from: Arlan Organ R      Created: Wed Jun 16, 2012  6:16 PM       Please call and tell him that his iron level is okay. Thanks. Pete ------

## 2012-06-21 ENCOUNTER — Encounter: Payer: Self-pay | Admitting: Family

## 2012-06-21 ENCOUNTER — Ambulatory Visit (INDEPENDENT_AMBULATORY_CARE_PROVIDER_SITE_OTHER): Payer: Medicare Other | Admitting: Family

## 2012-06-21 ENCOUNTER — Ambulatory Visit (HOSPITAL_BASED_OUTPATIENT_CLINIC_OR_DEPARTMENT_OTHER)
Admission: RE | Admit: 2012-06-21 | Discharge: 2012-06-21 | Disposition: A | Discharge status: Home / Self Care | Payer: Medicare Other | Source: Ambulatory Visit | Attending: Family | Admitting: Family

## 2012-06-21 VITALS — BP 113/78 | HR 111 | Temp 97.9°F | Resp 16 | Ht 67.0 in | Wt 164.0 lb

## 2012-06-21 DIAGNOSIS — E119 Type 2 diabetes mellitus without complications: Secondary | ICD-10-CM | POA: Insufficient documentation

## 2012-06-21 DIAGNOSIS — I10 Essential (primary) hypertension: Secondary | ICD-10-CM | POA: Insufficient documentation

## 2012-06-21 DIAGNOSIS — I700 Atherosclerosis of aorta: Secondary | ICD-10-CM | POA: Insufficient documentation

## 2012-06-21 DIAGNOSIS — R911 Solitary pulmonary nodule: Secondary | ICD-10-CM | POA: Insufficient documentation

## 2012-06-21 DIAGNOSIS — D638 Anemia in other chronic diseases classified elsewhere: Secondary | ICD-10-CM | POA: Insufficient documentation

## 2012-06-21 DIAGNOSIS — E7800 Pure hypercholesterolemia, unspecified: Secondary | ICD-10-CM | POA: Insufficient documentation

## 2012-06-21 DIAGNOSIS — R490 Dysphonia: Secondary | ICD-10-CM

## 2012-06-21 DIAGNOSIS — E1165 Type 2 diabetes mellitus with hyperglycemia: Secondary | ICD-10-CM

## 2012-06-21 DIAGNOSIS — R599 Enlarged lymph nodes, unspecified: Secondary | ICD-10-CM

## 2012-06-21 DIAGNOSIS — R9389 Abnormal findings on diagnostic imaging of other specified body structures: Secondary | ICD-10-CM

## 2012-06-21 DIAGNOSIS — K219 Gastro-esophageal reflux disease without esophagitis: Secondary | ICD-10-CM | POA: Insufficient documentation

## 2012-06-21 DIAGNOSIS — R319 Hematuria, unspecified: Secondary | ICD-10-CM

## 2012-06-21 DIAGNOSIS — I2510 Atherosclerotic heart disease of native coronary artery without angina pectoris: Secondary | ICD-10-CM | POA: Insufficient documentation

## 2012-06-21 DIAGNOSIS — G589 Mononeuropathy, unspecified: Secondary | ICD-10-CM | POA: Insufficient documentation

## 2012-06-21 DIAGNOSIS — Z0389 Encounter for observation for other suspected diseases and conditions ruled out: Secondary | ICD-10-CM | POA: Insufficient documentation

## 2012-06-21 DIAGNOSIS — I2584 Coronary atherosclerosis due to calcified coronary lesion: Secondary | ICD-10-CM | POA: Insufficient documentation

## 2012-06-21 LAB — POCT URINALYSIS DIPSTICK
Leukocytes, UA: NEGATIVE
Nitrite, UA: NEGATIVE
pH, UA: 6.5

## 2012-06-21 MED ORDER — FLUDROCORTISONE ACETATE 0.1 MG PO TABS: 0.1000 mg | ORAL_TABLET | Freq: Every day | ORAL | Status: DC

## 2012-06-21 MED ORDER — METFORMIN HCL 500 MG PO TABS: 500.0000 mg | ORAL_TABLET | Freq: Two times a day (BID) | ORAL | Status: DC

## 2012-06-21 NOTE — Progress Notes (Signed)
Subjective:    Patient ID: [name], male    DOB: 1945-05-12, 67 y.o.   MRN: [id number]  HPI  [name] is a 67 yr old male who presents today for follow up.  He is also concerned about some hoarseness.  1) Hoarseness-  Reports feeling "like there is something in my throat" for 2 weeks.  He reports that this has been going on for 2-3 weeks. He is continuing flonase and over the counter "sinus pills."    2) Dizziness- ongoing. Reports feeling "off balance.  Notes some ear congestion.   3) Hematuria- microscopic. Denies gross hematuria.  Denies dysuria or difficulty urinating.    4) DM2-  He reports that his sugars have been running in the 300's. This AM sugar was 161 fasting. He is currently on lantus 58 units.  He is continues humalog AC meals.       Review of Systems    see HPI  Past Medical History  Diagnosis Date  . Anemia, iron deficiency 03/24/2011  . 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 Hypertension   . Diabetes mellitus   . High cholesterol   . GERD (gastroesophageal reflux disease)   . Neuropathy   . Bronchitis 03/06/2012    History   Social History  . Marital Status: Married    Spouse Name: N/A    Number of Children: N/A  . Years of Education: N/A   Occupational History  . Not on file.   Social History Main Topics  . Smoking status: Former Smoker    Quit date: 03/17/2012  . Smokeless tobacco: Not on file  . Alcohol Use: No  . Drug Use: No  . Sexually Active: Not on file   Other Topics Concern  . Not on file   Social History Narrative  . No narrative on file    Past Surgical History  Procedure Laterality Date  . Back surgery    . Rotator cuff repair      No family history on file.  No Known Allergies  Current Outpatient Prescriptions on File Prior to Visit  Medication Sig Dispense Refill  . atorvastatin (LIPITOR) 80 MG tablet TAKE 1 TABLET BY MOUTH AT BEDTIME  30 tablet  2  . fluticasone (FLONASE) 50 MCG/ACT nasal spray Place 2  sprays into the nose daily.  16 g  6  . furosemide (LASIX) 20 MG tablet One by mouth in the morning as needed for swelling      . insulin glargine (LANTUS SOLOSTAR) 100 UNIT/ML injection Inject 50 Units into the skin at bedtime.  5 pen  6  . insulin lispro (HUMALOG) 100 UNIT/ML injection Inject 5 Units into the skin 3 (three) times daily before meals.  10 mL  12  . Insulin Syringe-Needle U-100 (INSULIN SYRINGE 1CC/31GX5/16") 31G X 5/16" 1 ML MISC Use with insulin injection three times daily.  100 each  3  . NEXIUM 40 MG capsule Take 40 mg by mouth 2 (two) times daily.       . NONFORMULARY OR COMPOUNDED ITEM meloxicam 0.9%, topriamate 3%, tizanidine 0.2%, lidocaine 3% apply to feet 3-4x daily prn      . UNKNOWN TO PATIENT 06-15-12 patient given printed copy of med list, not taking medication as directed.      . vardenafil (LEVITRA) 10 MG tablet Take 1 tablet (10 mg total) by mouth daily as needed for erectile dysfunction.  6 tablet  2  . VIGAMOX 0.5 % ophthalmic solution Place  1 drop into both eyes 3 (three) times daily.        No current facility-administered medications on file prior to visit.    BP 113/78  Pulse 111  Temp(Src) 97.9 F (36.6 C) (Oral)  Resp 16  Ht 5\' 7"  (1.702 m)  Wt 164 lb (74.39 kg)  BMI 25.68 kg/m2  SpO2 97%    Objective:   Physical Exam  Constitutional: He is oriented to person, place, and time. He appears well-developed and well-nourished. No distress.  HENT:  Head: Normocephalic and atraumatic.  Mouth/Throat: No oropharyngeal exudate, posterior oropharyngeal edema or posterior oropharyngeal erythema.  Cardiovascular: Normal rate and regular rhythm.   No murmur heard. Pulmonary/Chest: Effort normal and breath sounds normal. No respiratory distress. He has no wheezes. He has no rales. He exhibits no tenderness.  Musculoskeletal: He exhibits no edema.  Lymphadenopathy:    He has no cervical adenopathy.  Neurological: He is alert and oriented to person, place,  and time.  Skin: Skin is warm and dry.  Psychiatric: He has a normal mood and affect. His behavior is normal. Judgment and thought content normal.          Assessment & Plan:

## 2012-06-21 NOTE — Patient Instructions (Addendum)
You will be contacted about your referral to ENT and urology. You will also be contacted about your Chest CT.  Please let us know if you have not heard back within 1 week about your referral. Follow up in 2 weeks.   Humalog sliding scale- check sugar and inject 3 times daily before meals as dose as below:  <150-   Zero units 150-200 2 units 201-250 4 units 251-300 6 units 301-350 8 units 351-400 10 units >400             12 units and contact us.

## 2012-06-22 LAB — URINALYSIS, ROUTINE W REFLEX MICROSCOPIC
Glucose, UA: 500 mg/dL — AB
Leukocytes, UA: NEGATIVE
Protein, ur: >300 mg/dL — AB
Specific Gravity, Urine: >1.03 — ABNORMAL HIGH (ref 1.005–1.030)
pH: 6 (ref 5.0–8.0)

## 2012-06-22 LAB — URINALYSIS, MICROSCOPIC ONLY

## 2012-06-25 ENCOUNTER — Telehealth: Payer: Self-pay | Admitting: *Deleted

## 2012-06-25 NOTE — Telephone Encounter (Signed)
Pt left message requesting an Rx for inhaler. Attempted to reach pt and left detailed message on cell# to call and let me know if he has been on an inhaler in the past or why he feels he needs an inhaler at this time.

## 2012-06-26 DIAGNOSIS — R9389 Abnormal findings on diagnostic imaging of other specified body structures: Secondary | ICD-10-CM | POA: Insufficient documentation

## 2012-06-26 DIAGNOSIS — R490 Dysphonia: Secondary | ICD-10-CM | POA: Insufficient documentation

## 2012-06-26 NOTE — Assessment & Plan Note (Signed)
Orthostatics performed today show >20 point drop in SBP.  Will give trial of florinef.  This may be cause for his dizziness.

## 2012-06-26 NOTE — Assessment & Plan Note (Signed)
Last A1C was 9.2 last month.  Will add humalog sliding scale TID AC meals.

## 2012-06-26 NOTE — Assessment & Plan Note (Signed)
Refer to Urology.

## 2012-06-26 NOTE — Assessment & Plan Note (Signed)
New, will refer to ENT.

## 2012-06-26 NOTE — Assessment & Plan Note (Signed)
Previous CT chest noted borderline enlarged hilar LN.  Follow up CT is performed and does not note enlargement of hilar LN.

## 2012-07-05 ENCOUNTER — Ambulatory Visit (INDEPENDENT_AMBULATORY_CARE_PROVIDER_SITE_OTHER): Payer: Medicare Other | Admitting: Family

## 2012-07-05 ENCOUNTER — Encounter: Payer: Self-pay | Admitting: Family

## 2012-07-05 VITALS — BP 119/92 | HR 109 | Temp 98.6°F | Resp 16 | Ht 67.0 in | Wt 162.0 lb

## 2012-07-05 DIAGNOSIS — R490 Dysphonia: Secondary | ICD-10-CM

## 2012-07-05 DIAGNOSIS — E1165 Type 2 diabetes mellitus with hyperglycemia: Secondary | ICD-10-CM

## 2012-07-05 DIAGNOSIS — R319 Hematuria, unspecified: Secondary | ICD-10-CM

## 2012-07-05 DIAGNOSIS — I10 Essential (primary) hypertension: Secondary | ICD-10-CM

## 2012-07-05 MED ORDER — FLUDROCORTISONE ACETATE 0.1 MG PO TABS: 0.1000 mg | ORAL_TABLET | Freq: Every day | ORAL | Status: DC

## 2012-07-05 NOTE — Patient Instructions (Addendum)
You will be contacted about your referral to  Please let us know if you have not heard back within 1 week about your referral to the Endocrinologist to help manage your sugars. Please follow up in 3 months.

## 2012-07-05 NOTE — Assessment & Plan Note (Signed)
Labile, refer to endo.

## 2012-07-05 NOTE — Assessment & Plan Note (Signed)
Hoarseness is unchanged. He will see ENT tomorrow.

## 2012-07-05 NOTE — Assessment & Plan Note (Signed)
Pt instructed to keep upcoming apt with Urology.

## 2012-07-05 NOTE — Progress Notes (Signed)
Subjective:    Patient ID: [name], male    DOB: 1945-04-24, 67 y.o.   MRN: [id number]  HPI  Mr. Capili is a 67 yr old male who presents today for follow up.  1) DM2-Las visit humalog was placed on a sliding scale TID AC meals. He reports that yesterday his sugar was low fasting 61.  AC meals he has seen sugars ranging from 120 to 400.  2) HTN- last visit florinef was added due to orthostatic hypotension and dizziness.    3) Hoarseness- he has ENT consult scheduled.  4) Hematuria- scheduled to see Dr. Lindley Magnus- urology.  Reports feeling very upset re: recent sudden death of his sister.    Review of Systems See HPI  Past Medical History  Diagnosis Date  . Anemia, iron deficiency 03/24/2011  . 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 Hypertension   . Diabetes mellitus   . High cholesterol   . GERD (gastroesophageal reflux disease)   . Neuropathy   . Bronchitis 03/06/2012    History   Social History  . Marital Status: Married    Spouse Name: N/A    Number of Children: N/A  . Years of Education: N/A   Occupational History  . Not on file.   Social History Main Topics  . Smoking status: Former Smoker    Quit date: 03/17/2012  . Smokeless tobacco: Not on file  . Alcohol Use: No  . Drug Use: No  . Sexually Active: Not on file   Other Topics Concern  . Not on file   Social History Narrative  . No narrative on file    Past Surgical History  Procedure Laterality Date  . Back surgery    . Rotator cuff repair      No family history on file.  No Known Allergies  Current Outpatient Prescriptions on File Prior to Visit  Medication Sig Dispense Refill  . aspirin 81 MG tablet Take 81 mg by mouth daily.      [name] Kitchen atorvastatin (LIPITOR) 80 MG tablet TAKE 1 TABLET BY MOUTH AT BEDTIME  30 tablet  2  . fluticasone (FLONASE) 50 MCG/ACT nasal spray Place 2 sprays into the nose daily.  16 g  6  . furosemide (LASIX) 20 MG tablet One by mouth in the morning as needed for  swelling      . insulin glargine (LANTUS SOLOSTAR) 100 UNIT/ML injection Inject 50 Units into the skin at bedtime.  5 pen  6  . insulin lispro (HUMALOG) 100 UNIT/ML injection Inject 5 Units into the skin 3 (three) times daily before meals.  10 mL  12  . Insulin Syringe-Needle U-100 (INSULIN SYRINGE 1CC/31GX5/16") 31G X 5/16" 1 ML MISC Use with insulin injection three times daily.  100 each  3  . metFORMIN (GLUCOPHAGE) 500 MG tablet Take 1 tablet (500 mg total) by mouth 2 (two) times daily with a meal.  60 tablet  3  . NEXIUM 40 MG capsule Take 40 mg by mouth 2 (two) times daily.       . NONFORMULARY OR COMPOUNDED ITEM meloxicam 0.9%, topriamate 3%, tizanidine 0.2%, lidocaine 3% apply to feet 3-4x daily prn      . UNKNOWN TO PATIENT 06-15-12 patient given printed copy of med list, not taking medication as directed.      . vardenafil (LEVITRA) 10 MG tablet Take 1 tablet (10 mg total) by mouth daily as needed for erectile dysfunction.  6 tablet  2  .  VIGAMOX 0.5 % ophthalmic solution Place 1 drop into both eyes 3 (three) times daily.        No current facility-administered medications on file prior to visit.    BP 140/90  Pulse 103  Temp(Src) 97.8 F (36.6 C) (Oral)  Resp 16  Ht 5\' 7"  (1.702 m)  Wt 162 lb (73.483 kg)  BMI 25.37 kg/m2  SpO2 98%       Objective:   Physical Exam  Constitutional: He is oriented to person, place, and time. He appears well-developed and well-nourished. No distress.  HENT:  Head: Normocephalic and atraumatic.  Cardiovascular: Normal rate and regular rhythm.   No murmur heard. Pulmonary/Chest: Effort normal and breath sounds normal. No respiratory distress. He has no wheezes. He has no rales. He exhibits no tenderness.  Musculoskeletal: He exhibits no edema.  Neurological: He is alert and oriented to person, place, and time.  Skin: Skin is warm and dry.  Psychiatric: He has a normal mood and affect. His behavior is normal. Judgment and thought content  normal.          Assessment & Plan:

## 2012-07-05 NOTE — Assessment & Plan Note (Addendum)
Improvement in orthostatic symptoms however orthostatics are not significantly improved on florinef:  Laying 148/98     HR 98 Sitting 143/101    HR 105 Standing 119/92  HR 109  Monitor for now. Repeat next week at his follow up appointment.

## 2012-07-19 ENCOUNTER — Ambulatory Visit (INDEPENDENT_AMBULATORY_CARE_PROVIDER_SITE_OTHER): Payer: Medicare Other | Admitting: Internal Medicine

## 2012-07-19 ENCOUNTER — Encounter: Payer: Self-pay | Admitting: Internal Medicine

## 2012-07-19 VITALS — BP 138/80 | HR 112 | Temp 98.4°F | Resp 10 | Ht 67.0 in | Wt 162.0 lb

## 2012-07-19 DIAGNOSIS — E1165 Type 2 diabetes mellitus with hyperglycemia: Secondary | ICD-10-CM

## 2012-07-19 MED ORDER — INSULIN LISPRO 100 UNIT/ML ~~LOC~~ SOLN: SUBCUTANEOUS | Status: DC

## 2012-07-19 MED ORDER — INSULIN GLARGINE 100 UNIT/ML ~~LOC~~ SOLN: 45.0000 [IU] | Freq: Every day | SUBCUTANEOUS | Status: DC

## 2012-07-19 NOTE — Progress Notes (Signed)
Patient ID: [name], male   DOB: [birth year], 67 y.o.   MRN: [id number] HPI: [name] is a 67 y.o.-year-old male, referred by his PCP, Sandford Craze, for management of DM2, insulin-dependent, uncontrolled, with complications (CKD stage 3, peripheral neuropathy, ED).  Patient has been diagnosed with diabetes in ~1994; insulin added 1996. Last hemoglobin A1c was: Lab Results  Component Value Date   HGBA1C 9.2* 05/24/2012    Pt is on a regimen of: - Metformin 500 mg po bid - Lantus 58 units qhs (uses pens) - Humalog 5 units tid ac + SSI: 200-300: + 3 units, >300: 10 units (uses vials)  Pt checks his sugars 3-4 a day and they are: - am: ~260, but saw 500's - before dinner: 300s or highest No lows. Lowest sugar was 61 - woke him up at night; he mentions that he sometimes he drops his sugars after he eats dinner >> down to 50's even;  he has hypoglycemia awareness at 70. Highest sugar was 555.  Pt's meals are: - Breakfast: grits, egg, liver pudding - Lunch: PB+crackers or nothing - Dinner: meat+veggies+starch - Snacks: banana  Pt has chronic kidney disease stage 3, last BUN/creatinine was:  Lab Results  Component Value Date   BUN 13 05/24/2012   CREATININE 1.26 05/24/2012   Last set of lipids: Lab Results  Component Value Date   CHOL 173 01/29/2012   HDL 33* 01/29/2012   LDLCALC 110* 01/29/2012   TRIG 149 01/29/2012   CHOLHDL 5.2 01/29/2012   Pt's last eye exam was in Spring of last year - Dr. Hazle Quant. ? No DR. Has numbness and tingling in his legs.  PMH: I reviewed his chart and he also has a history of HTN, microscopic hematuria, gastro- and laryngo-esophageal reflux with hoarseness, IDA, anemia of chronic renal ds., HL. Started Florinef 4 days ago for orthostatic hypotension and dizziness.   No regular exercise.  Pt has FH of DM in brother, mother, sister, cousin.   ROS: Constitutional: + weight loss (4 lbs), + fatigue, + subjective hypothermia, had nocturia every  hour in the past, but after adding Flomax, just once a night  Eyes: + blurry vision, + xerophthalmia ENT: no sore throat, no nodules palpated in throat, no dysphagia/odynophagia, + hoarseness; decreased hearing, tinnitus Cardiovascular: no CP/SOB/palpitations/leg swelling Respiratory: no cough/SOB Gastrointestinal: no N/V/D/C Musculoskeletal: no muscle/joint aches Skin: no rashes Neurological: no tremors/numbness/tingling/dizziness, + HAs Psychiatric: no depression/anxiety  Past Surgical History  Procedure Laterality Date  . Back surgery    . Rotator cuff repair     History   Social History  . Marital Status: Married    Spouse Name: N/A    Number of Children: 2   Occupational History  . Disabled   Social History Main Topics  . Smoking status: Former Smoker    Quit date: 03/17/2012  . Smokeless tobacco: No  . Alcohol Use: No  . Drug Use: No   Medication Sig  . aspirin 81 MG tablet Take 81 mg by mouth daily.  [name] Kitchen atorvastatin (LIPITOR) 80 MG tablet TAKE 1 TABLET BY MOUTH AT BEDTIME  . fludrocortisone (FLORINEF) 0.1 MG tablet Take 1 tablet (0.1 mg total) by mouth daily.  . fluticasone (FLONASE) 50 MCG/ACT nasal spray Place 2 sprays into the nose daily.  . furosemide (LASIX) 20 MG tablet One by mouth in the morning as needed for swelling  . insulin glargine (LANTUS SOLOSTAR) 100 UNIT/ML injection Inject 50 Units into the skin at bedtime.(taking  58 units)  . insulin lispro (HUMALOG) 100 UNIT/ML injection Inject 5 Units into the skin 3 (three) times daily before meals.  . Insulin Syringe-Needle U-100 (INSULIN SYRINGE 1CC/31GX5/16") 31G X 5/16" 1 ML MISC Use with insulin injection three times daily.  . metFORMIN (GLUCOPHAGE) 500 MG tablet Take 1 tablet (500 mg total) by mouth 2 (two) times daily with a meal.  . NEXIUM 40 MG capsule Take 40 mg by mouth 2 (two) times daily.   . NONFORMULARY OR COMPOUNDED ITEM meloxicam 0.9%, topriamate 3%, tizanidine 0.2%, lidocaine 3% apply to feet  3-4x daily prn  . UNKNOWN TO PATIENT 06-15-12 patient given printed copy of med list, not taking medication as directed.  . vardenafil (LEVITRA) 10 MG tablet Take 1 tablet (10 mg total) by mouth daily as needed for erectile dysfunction.  [name] Kitchen VIGAMOX 0.5 % ophthalmic solution Place 1 drop into both eyes 3 (three) times daily.    No current facility-administered medications on file prior to visit.   No Known Allergies  PE: BP 138/80  Pulse 112  Temp(Src) 98.4 F (36.9 C) (Oral)  Resp 10  Ht 5\' 7"  (1.702 m)  Wt 162 lb (73.483 kg)  BMI 25.37 kg/m2  SpO2 97% Wt Readings from Last 3 Encounters:  07/19/12 162 lb (73.483 kg)  07/05/12 162 lb (73.483 kg)  06/21/12 164 lb (74.39 kg)   Constitutional: normal weight, in NAD Eyes: PERRLA, EOMI, no exophthalmos ENT: moist mucous membranes, no thyromegaly, no cervical lymphadenopathy Cardiovascular: RRR, No MRG Respiratory: CTA B Gastrointestinal: abdomen soft, NT, ND, BS+ Musculoskeletal: no deformities, strength intact in all 4 Skin: moist, warm, no rashes Neurological: no tremor with outstretched hands, DTR normal in all 4  ASSESSMENT: 1. DM2, insulin-dependent, uncontrolled, with complications - CKD 3 - ED - peripheral neuropathy  PLAN:  1. Pt with long-standing DM2, uncontrolled, on a large dose of basal compared to mealtime insulin, resulting in low CBGs (alternating with highs). - It is difficult to adjust pt's insulin regimen without seeing his sugars...explained the importance of bringing sugar log at every appt - explained the difference between Lantus and Humalog  - First, we will try to reduce the incidence of low CBGs, so we will decrease the Lantus dose. We will make a compensatory increase in mealtime insulin (see below): Please decrease Lantus to 45 units at night. Please increase Humalog as follows: - 5 units with a small meal - 7 units with a normal meal - 9 units with a large meal Also, add the following sliding  scale: - 150-200: +2 units - 201-250: +3 units - 251-300: +4 units - >301: +5 units - given sugar log and advised how to fill it and to bring it at next appt - given foot care handout and explained the principles - given instructions for hypoglycemia management "15-15 rule" - I advised him to schedule a new appt with his eye dr. [name] Kitchen no labs for today - I will see him back in 1 mo with his sugar log -  I advised them to join MyChart and let me know if sugars <70 or >200 before next appt

## 2012-07-19 NOTE — Patient Instructions (Addendum)
Please decrease Lantus to 45 units at night. Please increase Humalog as follows: - 5 units with a small meal - 7 units with a normal meal - 9 units with a large meal Also, add the following sliding scale: - 150-200: +2 units - 201-250: +3 units - 251-300: +4 units - >301: +5 units  Please return in 1 month with your sugar log.   PATIENT INSTRUCTIONS FOR TYPE 2 DIABETES:  **Please join MyChart!** - see attached instructions about how to join   DIET AND EXERCISE Diet and exercise is an important part of diabetic treatment.  We recommended aerobic exercise in the form of brisk walking (working between 40-60% of maximal aerobic capacity, similar to brisk walking) for 150 minutes per week (such as 30 minutes five days per week) along with 3 times per week performing 'resistance' training (using various gauge rubber tubes with handles) 5-10 exercises involving the major muscle groups (upper body, lower body and core) performing 10-15 repetitions (or near fatigue) each exercise. Start at half the above goal but build slowly to reach the above goals. If limited by weight, joint pain, or disability, we recommend daily walking in a swimming pool with water up to waist to reduce pressure from joints while allow for adequate exercise.    BLOOD GLUCOSES Monitoring your blood glucoses is important for continued management of your diabetes. Please check your blood glucoses 2-4 times a day: fasting, before meals and at bedtime (you can rotate these measurements - e.g. one day check before the 3 meals, the next day check before 2 of the meals and before bedtime, etc.   HYPOGLYCEMIA (low blood sugar) Hypoglycemia is usually a reaction to not eating, exercising, or taking too much insulin/ other diabetes drugs.  Symptoms include tremors, sweating, hunger, confusion, headache, etc. Treat IMMEDIATELY with 15 grams of Carbs:   4 glucose tablets    cup regular juice/soda   2 tablespoons raisins   4  teaspoons sugar   1 tablespoon honey Recheck blood glucose in 15 mins and repeat above if still symptomatic/blood glucose <100. Please contact our office at [phone] if you have questions about how to next handle your insulin.  RECOMMENDATIONS TO REDUCE YOUR RISK OF DIABETIC COMPLICATIONS: * Take your prescribed MEDICATION(S). * Follow a DIABETIC diet: Complex carbs, fiber rich foods, heart healthy fish twice weekly, (monounsaturated and polyunsaturated) fats * AVOID saturated/trans fats, high fat foods, >2,300 mg salt per day. * EXERCISE at least 5 times a week for 30 minutes or preferably daily.  * DO NOT SMOKE OR DRINK more than 1 drink a day. * Check your FEET every day. Do not wear tightfitting shoes. Contact us if you develop an ulcer * See your EYE doctor once a year or more if needed * Get a FLU shot once a year * Get a PNEUMONIA vaccine once before and once after age 10 years  GOALS:  * Your Hemoglobin A1c of <7%  * Your Systolic BP should be 140 or lower  * Your Diastolic BP should be 80 or lower  * Your HDL (Good Cholesterol) should be 40 or higher  * Your LDL (Bad Cholesterol) should be 100 or lower  * Your Triglycerides should be 150 or lower  * Your Urine microalbumin (kidney function) should be <30 * Your Body Mass Index should be 25 or lower   We will be glad to help you achieve these goals. Our telephone number is: [phone].

## 2012-07-26 ENCOUNTER — Telehealth: Payer: Self-pay | Admitting: Internal Medicine

## 2012-07-26 NOTE — Telephone Encounter (Signed)
Fine, please send a new one with 3x a day. Thank you.

## 2012-07-26 NOTE — Telephone Encounter (Signed)
[name] with [facility] called back & stated the Rx can not read 3-4 times a day, it has to give either 3 or 4 times a day as instructions. You can call them back to correct at [phone] or resend the fax with the correction initialed with MD to [phone]?

## 2012-07-26 NOTE — Telephone Encounter (Signed)
Please read note below and advise. Thank you.  

## 2012-07-26 NOTE — Telephone Encounter (Signed)
Patient states his insulin is very expensive. Do you have any suggestions?

## 2012-07-26 NOTE — Telephone Encounter (Signed)
LVM for pt to call and let me know the type of meter he uses for testing.

## 2012-07-26 NOTE — Telephone Encounter (Signed)
Called patient back and talked to his wife. They obtained approximately $50 for each Rx of Lantus and Humalog at this time, and this is too high for them. At next visit we need to talk about changing the 2 insulins to NPH and regular to reduce the cost. Patient was at the pharmacy to refill his insulin when I talked to his wife, I will talk to him in person at the time of his appointment which will be in 3 weeks.

## 2012-07-26 NOTE — Telephone Encounter (Signed)
Please read note below and advise.  

## 2012-08-11 ENCOUNTER — Emergency Department (HOSPITAL_BASED_OUTPATIENT_CLINIC_OR_DEPARTMENT_OTHER)
Admission: EM | Admit: 2012-08-11 | Discharge: 2012-08-11 | Disposition: A | Discharge status: Home / Self Care | Payer: Medicare Other | Attending: Emergency Medicine | Admitting: Emergency Medicine

## 2012-08-11 ENCOUNTER — Encounter (HOSPITAL_BASED_OUTPATIENT_CLINIC_OR_DEPARTMENT_OTHER): Payer: Self-pay | Admitting: *Deleted

## 2012-08-11 DIAGNOSIS — H5789 Other specified disorders of eye and adnexa: Secondary | ICD-10-CM | POA: Insufficient documentation

## 2012-08-11 DIAGNOSIS — Z87891 Personal history of nicotine dependence: Secondary | ICD-10-CM | POA: Insufficient documentation

## 2012-08-11 DIAGNOSIS — Y9389 Activity, other specified: Secondary | ICD-10-CM | POA: Insufficient documentation

## 2012-08-11 DIAGNOSIS — Y929 Unspecified place or not applicable: Secondary | ICD-10-CM | POA: Insufficient documentation

## 2012-08-11 DIAGNOSIS — S0501XA Injury of conjunctiva and corneal abrasion without foreign body, right eye, initial encounter: Secondary | ICD-10-CM

## 2012-08-11 DIAGNOSIS — E7800 Pure hypercholesterolemia, unspecified: Secondary | ICD-10-CM | POA: Insufficient documentation

## 2012-08-11 DIAGNOSIS — Z7982 Long term (current) use of aspirin: Secondary | ICD-10-CM | POA: Insufficient documentation

## 2012-08-11 DIAGNOSIS — K219 Gastro-esophageal reflux disease without esophagitis: Secondary | ICD-10-CM | POA: Insufficient documentation

## 2012-08-11 DIAGNOSIS — E119 Type 2 diabetes mellitus without complications: Secondary | ICD-10-CM | POA: Insufficient documentation

## 2012-08-11 DIAGNOSIS — Z794 Long term (current) use of insulin: Secondary | ICD-10-CM | POA: Insufficient documentation

## 2012-08-11 DIAGNOSIS — Z8669 Personal history of other diseases of the nervous system and sense organs: Secondary | ICD-10-CM | POA: Insufficient documentation

## 2012-08-11 DIAGNOSIS — X58XXXA Exposure to other specified factors, initial encounter: Secondary | ICD-10-CM | POA: Insufficient documentation

## 2012-08-11 DIAGNOSIS — H539 Unspecified visual disturbance: Secondary | ICD-10-CM | POA: Insufficient documentation

## 2012-08-11 DIAGNOSIS — S058X9A Other injuries of unspecified eye and orbit, initial encounter: Secondary | ICD-10-CM | POA: Insufficient documentation

## 2012-08-11 DIAGNOSIS — I10 Essential (primary) hypertension: Secondary | ICD-10-CM | POA: Insufficient documentation

## 2012-08-11 DIAGNOSIS — Z79899 Other long term (current) drug therapy: Secondary | ICD-10-CM | POA: Insufficient documentation

## 2012-08-11 DIAGNOSIS — Z8709 Personal history of other diseases of the respiratory system: Secondary | ICD-10-CM | POA: Insufficient documentation

## 2012-08-11 DIAGNOSIS — Z862 Personal history of diseases of the blood and blood-forming organs and certain disorders involving the immune mechanism: Secondary | ICD-10-CM | POA: Insufficient documentation

## 2012-08-11 DIAGNOSIS — IMO0002 Reserved for concepts with insufficient information to code with codable children: Secondary | ICD-10-CM | POA: Insufficient documentation

## 2012-08-11 MED ORDER — POLYMYXIN B-TRIMETHOPRIM 10000-0.1 UNIT/ML-% OP SOLN: 1.0000 [drp] | OPHTHALMIC | Status: DC

## 2012-08-11 MED ORDER — FLUORESCEIN SODIUM 1 MG OP STRP
1.0000 | ORAL_STRIP | Freq: Once | OPHTHALMIC | Status: AC
  Administered 2012-08-11: 1 via OPHTHALMIC
  Filled 2012-08-11: qty 1

## 2012-08-11 MED ORDER — TETRACAINE HCL 0.5 % OP SOLN
2.0000 [drp] | Freq: Once | OPHTHALMIC | Status: AC
  Administered 2012-08-11: 2 [drp] via OPHTHALMIC
  Filled 2012-08-11: qty 2

## 2012-08-11 MED ORDER — OXYCODONE-ACETAMINOPHEN 5-325 MG PO TABS: 2.0000 | ORAL_TABLET | ORAL | Status: DC | PRN

## 2012-08-11 MED ORDER — ERYTHROMYCIN 5 MG/GM OP OINT
TOPICAL_OINTMENT | Freq: Once | OPHTHALMIC | Status: DC
  Filled 2012-08-11: qty 3.5

## 2012-08-11 NOTE — ED Notes (Signed)
Pt c/o right eye pain, redness x 2 days ? Foreign object

## 2012-08-11 NOTE — ED Provider Notes (Signed)
History     CSN: [id number]  Arrival date & time 08/11/12  4098   First MD Initiated Contact with Patient 08/11/12 1914      Chief Complaint  Patient presents with  . Eye Problem    (Consider location/radiation/quality/duration/timing/severity/associated sxs/prior treatment) HPI Comments: Patient presents with 2 day history of right eye pain and redness and tearing. Denies any trauma denies getting anything in his eye. States his vision is more blurry than usual. Denies any headache or vomiting. Is a history of diabetes. He wears glasses but no contacts.he's been using allergies.no relief.  The history is provided by the patient.    Past Medical History  Diagnosis Date  . Anemia, iron deficiency 03/24/2011  . 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 Hypertension   . Diabetes mellitus   . High cholesterol   . GERD (gastroesophageal reflux disease)   . Neuropathy   . Bronchitis 03/06/2012    Past Surgical History  Procedure Laterality Date  . Back surgery    . Rotator cuff repair      History reviewed. No pertinent family history.  History  Substance Use Topics  . Smoking status: Former Smoker    Quit date: 03/17/2012  . Smokeless tobacco: Not on file  . Alcohol Use: No      Review of Systems  Constitutional: Negative for activity change and appetite change.  HENT: Negative for congestion, rhinorrhea and postnasal drip.   Eyes: Positive for pain, discharge, redness and visual disturbance.  Respiratory: Negative for cough and shortness of breath.   Cardiovascular: Negative for chest pain.  Gastrointestinal: Negative for nausea, vomiting and abdominal pain.  Genitourinary: Negative for dysuria and hematuria.  Musculoskeletal: Negative for back pain.  Skin: Negative for rash.  Neurological: Negative for dizziness and headaches.  A complete 10 system review of systems was obtained and all systems are negative except as noted in the HPI and PMH.    Allergies   Review of patient's allergies indicates no known allergies.  Home Medications   Current Outpatient Rx  Name  Route  Sig  Dispense  Refill  . aspirin 81 MG tablet   Oral   Take 81 mg by mouth daily.         [name] Kitchen atorvastatin (LIPITOR) 80 MG tablet      TAKE 1 TABLET BY MOUTH AT BEDTIME   30 tablet   2   . clotrimazole-betamethasone (LOTRISONE) cream               . fludrocortisone (FLORINEF) 0.1 MG tablet   Oral   Take 1 tablet (0.1 mg total) by mouth daily.   30 tablet   2   . fluticasone (FLONASE) 50 MCG/ACT nasal spray   Nasal   Place 2 sprays into the nose daily.   16 g   6   . furosemide (LASIX) 20 MG tablet      One by mouth in the morning as needed for swelling         . insulin glargine (LANTUS) 100 UNIT/ML injection   Subcutaneous   Inject 0.45 mLs (45 Units total) into the skin at bedtime.   5 pen   6   . insulin lispro (HUMALOG) 100 UNIT/ML injection      Use as advised - maximum ~18 units per day   10 mL   12     Humalog: - 5 units with a small meal - 7 units w ...   . Insulin  Syringe-Needle U-100 (INSULIN SYRINGE 1CC/31GX5/16") 31G X 5/16" 1 ML MISC      Use with insulin injection three times daily.   100 each   3   . metFORMIN (GLUCOPHAGE) 500 MG tablet   Oral   Take 1 tablet (500 mg total) by mouth 2 (two) times daily with a meal.   60 tablet   3   . metFORMIN (GLUCOPHAGE) 500 MG tablet               . NEXIUM 40 MG capsule   Oral   Take 40 mg by mouth 2 (two) times daily.          . NONFORMULARY OR COMPOUNDED ITEM      meloxicam 0.9%, topriamate 3%, tizanidine 0.2%, lidocaine 3% apply to feet 3-4x daily prn         . oxyCODONE-acetaminophen (PERCOCET/ROXICET) 5-325 MG per tablet   Oral   Take 2 tablets by mouth every 4 (four) hours as needed for pain.   15 tablet   0   . tamsulosin (FLOMAX) 0.4 MG CAPS               . trimethoprim-polymyxin b (POLYTRIM) ophthalmic solution   Right Eye   Place 1 drop  into the right eye every 4 (four) hours.   10 mL   0   . UNKNOWN TO PATIENT      06-15-12 patient given printed copy of med list, not taking medication as directed.         . vardenafil (LEVITRA) 10 MG tablet   Oral   Take 1 tablet (10 mg total) by mouth daily as needed for erectile dysfunction.   6 tablet   2   . VIGAMOX 0.5 % ophthalmic solution   Both Eyes   Place 1 drop into both eyes 3 (three) times daily.            BP 162/82  Pulse 104  Temp(Src) 98.7 F (37.1 C) (Oral)  Resp 20  Ht 5\' 8"  (1.727 m)  Wt 162 lb (73.483 kg)  BMI 24.64 kg/m2  SpO2 96%  Physical Exam  Constitutional: He is oriented to person, place, and time. He appears well-developed and well-nourished. No distress.  HENT:  Head: Normocephalic and atraumatic.  Mouth/Throat: Oropharynx is clear and moist. No oropharyngeal exudate.  Eyes: Pupils are equal, round, and reactive to light. No foreign bodies found. No foreign body present in the right eye. Right conjunctiva is injected.  Slit lamp exam:      The right eye shows corneal abrasion and fluorescein uptake. The right eye shows no foreign body, no hyphema, no hypopyon and no anterior chamber bulge.  Anterior chamber clear. Wedge shaped corneal abrasion with subtle streaking of fluoroscein.   Neck: Normal range of motion. Neck supple.  Cardiovascular: Normal rate, regular rhythm and normal heart sounds.   No murmur heard. Pulmonary/Chest: Effort normal and breath sounds normal. No respiratory distress.  Abdominal: Soft. There is no tenderness. There is no rebound and no guarding.  Musculoskeletal: Normal range of motion. He exhibits no edema and no tenderness.  Neurological: He is alert and oriented to person, place, and time. No cranial nerve deficit. He exhibits normal muscle tone. Coordination normal.  Skin: Skin is warm.    ED Course  Procedures (including critical care time)  Labs Reviewed - No data to display No results  found.   1. Corneal abrasion, right, initial encounter  MDM  2 DAY history of right eye pain and redness. Denies any trauma. Slightly blurry vision, excessive tearing.  Anterior chamber clear.  Wedge shaped corneal abrasion to right eye in 3-6 clock position. Intraocular pressure 18 and the right, 21 on the left. Visual acuity 20/40 both eyes  Concern for possible subtle globe rupture. No history of trauma. Discussed with Dr. Delaney Meigs will see patient in his office now. Eye shield placed.  Patient expressed understanding to see Dr. Delaney Meigs now, directions and phone number given. Rx for pain medication and antibiotics provided.  Glynn Octave, MD 08/11/12 [phone]

## 2012-08-16 ENCOUNTER — Ambulatory Visit (HOSPITAL_BASED_OUTPATIENT_CLINIC_OR_DEPARTMENT_OTHER): Payer: Medicare Other | Admitting: Hematology & Oncology

## 2012-08-16 ENCOUNTER — Ambulatory Visit (HOSPITAL_BASED_OUTPATIENT_CLINIC_OR_DEPARTMENT_OTHER): Payer: Medicare Other

## 2012-08-16 ENCOUNTER — Other Ambulatory Visit (HOSPITAL_BASED_OUTPATIENT_CLINIC_OR_DEPARTMENT_OTHER): Payer: Medicare Other | Admitting: Lab

## 2012-08-16 VITALS — BP 138/75 | HR 95 | Temp 97.3°F | Resp 18 | Ht 68.0 in | Wt 164.0 lb

## 2012-08-16 DIAGNOSIS — D500 Iron deficiency anemia secondary to blood loss (chronic): Secondary | ICD-10-CM

## 2012-08-16 DIAGNOSIS — K922 Gastrointestinal hemorrhage, unspecified: Secondary | ICD-10-CM

## 2012-08-16 DIAGNOSIS — D509 Iron deficiency anemia, unspecified: Secondary | ICD-10-CM

## 2012-08-16 DIAGNOSIS — E1129 Type 2 diabetes mellitus with other diabetic kidney complication: Secondary | ICD-10-CM

## 2012-08-16 DIAGNOSIS — D649 Anemia, unspecified: Secondary | ICD-10-CM

## 2012-08-16 DIAGNOSIS — N039 Chronic nephritic syndrome with unspecified morphologic changes: Secondary | ICD-10-CM

## 2012-08-16 DIAGNOSIS — N189 Chronic kidney disease, unspecified: Secondary | ICD-10-CM

## 2012-08-16 DIAGNOSIS — E1165 Type 2 diabetes mellitus with hyperglycemia: Secondary | ICD-10-CM

## 2012-08-16 DIAGNOSIS — N289 Disorder of kidney and ureter, unspecified: Secondary | ICD-10-CM

## 2012-08-16 LAB — CBC WITH DIFFERENTIAL (CANCER CENTER ONLY)
BASO%: 0.9 % (ref 0.0–2.0)
EOS%: 2.8 % (ref 0.0–7.0)
HCT: 28.9 % — ABNORMAL LOW (ref 38.7–49.9)
LYMPH%: 17.9 % (ref 14.0–48.0)
MCH: 27 pg — ABNORMAL LOW (ref 28.0–33.4)
MCHC: 32.2 g/dL (ref 32.0–35.9)
MCV: 84 fL (ref 82–98)
MONO%: 13 % (ref 0.0–13.0)
NEUT%: 65.4 % (ref 40.0–80.0)
Platelets: 366 10*3/uL (ref 145–400)
RDW: 13.4 % (ref 11.1–15.7)
WBC: 4.6 10*3/uL (ref 4.0–10.0)

## 2012-08-16 LAB — WHOLE BLOOD GLUCOSE - CHCC SATELLITE: Glucose: 185 mg/dL — ABNORMAL HIGH (ref 70–99)

## 2012-08-16 LAB — IRON AND TIBC: %SAT: 11 % — ABNORMAL LOW (ref 20–55)

## 2012-08-16 MED ORDER — SODIUM CHLORIDE 0.9 % IV SOLN
1020.0000 mg | Freq: Once | INTRAVENOUS | Status: AC
  Administered 2012-08-16: 1020 mg via INTRAVENOUS
  Filled 2012-08-16: qty 34

## 2012-08-16 MED ORDER — DARBEPOETIN ALFA-POLYSORBATE 300 MCG/0.6ML IJ SOLN
300.0000 ug | Freq: Once | INTRAMUSCULAR | Status: AC
  Administered 2012-08-16: 300 ug via SUBCUTANEOUS

## 2012-08-16 NOTE — Patient Instructions (Addendum)
Ferumoxytol injection What is this medicine? FERUMOXYTOL is an iron complex. Iron is used to make healthy red blood cells, which carry oxygen and nutrients throughout the body. This medicine is used to treat iron deficiency anemia in people with chronic kidney disease. This medicine may be used for other purposes; ask your health care provider or pharmacist if you have questions. What should I tell my health care provider before I take this medicine? They need to know if you have any of these conditions: -anemia not caused by low iron levels -high levels of iron in the blood -magnetic resonance imaging (MRI) test scheduled -an unusual or allergic reaction to iron, other medicines, foods, dyes, or preservatives -pregnant or trying to get pregnant -breast-feeding How should I use this medicine? This medicine is for infusion into a vein. It is given by a health care professional in a hospital or clinic setting.  Darbepoetin Alfa injection What is this medicine? DARBEPOETIN ALFA (dar be POE e tin AL fa) helps your body make more red blood cells. It is used to treat anemia caused by chronic kidney failure and chemotherapy. This medicine may be used for other purposes; ask your health care provider or pharmacist if you have questions. What should I tell my health care provider before I take this medicine? They need to know if you have any of these conditions: -blood clotting disorders or history of blood clots -cancer patient not on chemotherapy -cystic fibrosis -heart disease, such as angina, heart failure, or a history of a heart attack -hemoglobin level of 12 g/dL or greater -high blood pressure -low levels of folate, iron, or vitamin B12 -seizures -an unusual or allergic reaction to darbepoetin, erythropoietin, albumin, hamster proteins, latex, other medicines, foods, dyes, or preservatives -pregnant or trying to get pregnant -breast-feeding How should I use this medicine? This  medicine is for injection into a vein or under the skin. It is usually given by a health care professional in a hospital or clinic setting. If you get this medicine at home, you will be taught how to prepare and give this medicine. Do not shake the solution before you withdraw a dose. Use exactly as directed. Take your medicine at regular intervals. Do not take your medicine more often than directed. It is important that you put your used needles and syringes in a special sharps container. Do not put them in a trash can. If you do not have a sharps container, call your pharmacist or healthcare provider to get one. Talk to your pediatrician regarding the use of this medicine in children. While this medicine may be used in children as young as 1 year for selected conditions, precautions do apply. Overdosage: If you think you have taken too much of this medicine contact a poison control center or emergency room at once. NOTE: This medicine is only for you. Do not share this medicine with others. What if I miss a dose? If you miss a dose, take it as soon as you can. If it is almost time for your next dose, take only that dose. Do not take double or extra doses. What may interact with this medicine? Do not take this medicine with any of the following medications: -epoetin alfa This list may not describe all possible interactions. Give your health care provider a list of all the medicines, herbs, non-prescription drugs, or dietary supplements you use. Also tell them if you smoke, drink alcohol, or use illegal drugs. Some items may interact with your medicine.  What should I watch for while using this medicine? Visit your prescriber or health care professional for regular checks on your progress and for the needed blood tests and blood pressure measurements. It is especially important for the doctor to make sure your hemoglobin level is in the desired range, to limit the risk of potential side effects and to  give you the best benefit. Keep all appointments for any recommended tests. Check your blood pressure as directed. Ask your doctor what your blood pressure should be and when you should contact him or her. As your body makes more red blood cells, you may need to take iron, folic acid, or vitamin B supplements. Ask your doctor or health care provider which products are right for you. If you have kidney disease continue dietary restrictions, even though this medication can make you feel better. Talk with your doctor or health care professional about the foods you eat and the vitamins that you take. What side effects may I notice from receiving this medicine? Side effects that you should report to your doctor or health care professional as soon as possible: -allergic reactions like skin rash, itching or hives, swelling of the face, lips, or tongue -breathing problems -changes in vision -chest pain -confusion, trouble speaking or understanding -feeling faint or lightheaded, falls -high blood pressure -muscle aches or pains -pain, swelling, warmth in the leg -rapid weight gain -severe headaches -sudden numbness or weakness of the face, arm or leg -trouble walking, dizziness, loss of balance or coordination -seizures (convulsions) -swelling of the ankles, feet, hands -unusually weak or tired Side effects that usually do not require medical attention (report to your doctor or health care professional if they continue or are bothersome): -diarrhea -fever, chills (flu-like symptoms) -headaches -nausea, vomiting -redness, stinging, or swelling at site where injected This list may not describe all possible side effects. Call your doctor for medical advice about side effects. You may report side effects to FDA at 1-800-FDA-1088. Where should I keep my medicine? Keep out of the reach of children. Store in a refrigerator between 2 and 8 degrees C (36 and 46 degrees F). Do not freeze. Do not shake.  Throw away any unused portion if using a single-dose vial. Throw away any unused medicine after the expiration date. NOTE: This sheet is a summary. It may not cover all possible information. If you have questions about this medicine, talk to your doctor, pharmacist, or health care provider.  2013, [employer]. (02/15/2008 10:23:57 AM)   Talk to your pediatrician regarding the use of this medicine in children. Special care may be needed. Overdosage: If you think you've taken too much of this medicine contact a poison control center or emergency room at once. Overdosage: If you think you have taken too much of this medicine contact a poison control center or emergency room at once. NOTE: This medicine is only for you. Do not share this medicine with others. What if I miss a dose? It is important not to miss your dose. Call your doctor or health care professional if you are unable to keep an appointment. What may interact with this medicine? This medicine may interact with the following medications: -other iron products This list may not describe all possible interactions. Give your health care provider a list of all the medicines, herbs, non-prescription drugs, or dietary supplements you use. Also tell them if you smoke, drink alcohol, or use illegal drugs. Some items may interact with your medicine. What should I watch  for while using this medicine? Visit your doctor or healthcare professional regularly. Tell your doctor or healthcare professional if your symptoms do not start to get better or if they get worse. You may need blood work done while you are taking this medicine. You may need to follow a special diet. Talk to your doctor. Foods that contain iron include: whole grains/cereals, dried fruits, beans, or peas, leafy green vegetables, and organ meats (liver, kidney). What side effects may I notice from receiving this medicine? Side effects that you should report to your doctor or  health care professional as soon as possible: -allergic reactions like skin rash, itching or hives, swelling of the face, lips, or tongue -breathing problems -changes in blood pressure -feeling faint or lightheaded, falls -fever or chills -flushing, sweating, or hot feelings -swelling of the ankles or feet Side effects that usually do not require medical attention (Report these to your doctor or health care professional if they continue or are bothersome.): -diarrhea -headache -nausea, vomiting -stomach pain This list may not describe all possible side effects. Call your doctor for medical advice about side effects. You may report side effects to FDA at 1-800-FDA-1088. Where should I keep my medicine? This drug is given in a hospital or clinic and will not be stored at home. NOTE: This sheet is a summary. It may not cover all possible information. If you have questions about this medicine, talk to your doctor, pharmacist, or health care provider.  2012, [employer] Standard. (11/24/2007 9:48:25 PM)

## 2012-08-16 NOTE — Progress Notes (Signed)
This office note has been dictated.

## 2012-08-17 NOTE — Progress Notes (Signed)
DIAGNOSES: 1. Recurrent iron-deficiency anemia. 2. Recurrent gastrointestinal bleed. 3. Diabetes, insulin dependent. 4. Renal insufficiency secondary to diabetes; current therapy     intravenous iron as indicated -- the patient will receive a dose     today and Aranesp 300 mcg subcu as-needed for hemoglobin less than     10 -- the patient will receive a dose today.  INTERIM HISTORY:  [name] comes in for his follow-up.  He is not doing too well he says.  His blood sugars have been overall poorly controlled __________.  He says that blood sugar this morning was over 400.  He says he takes 40 units of insulin at nighttime.  This, I find hard to believe that his blood sugars are still this poorly controlled.  We will go ahead and check blood sugars this morning and see what it is.  He has not had any kind of obvious melena or bright-red blood per rectum.  His appetite is doing okay.  He has had no cough.  He has had no leg swelling.  He has had no fevers, sweats or chills.  Back 2 months ago, his ferritin was 49 with iron saturation of 15%.  His last dose of IV iron was back in February.  PHYSICAL EXAMINATION:  General:  This is a well-developed, well- nourished African American gentleman in no obvious distress.  Vital Signs:  Temperature 97.3, pulse 95, respiratory rate 18, blood pressure 138/75, weight is 164.  Head/Neck:  Normocephalic, atraumatic skull. There are no ocular or oral lesions.  There are no palpable cervical or supraclavicular lymph nodes.  Lungs:  Clear bilaterally.  Cardiac: Regular rate and rhythm with a normal S1, S2.  There are no murmurs, rubs or bruits.  Abdomen:  Soft, with good bowel sounds.  There is no palpable abdominal mass.  There is no fluid wave.  There is no palpable hepatosplenomegaly.  Extremities:  Show no clubbing, cyanosis or edema. Neurologic:  No focal neurological deficits.  LABORATORY STUDIES:  White cell count 4.6, hemoglobin 9.3,  hematocrit 28.9, platelet count 366.  IMPRESSION/PLAN:  [name] is a 67 year old gentleman with a history of gastrointestinal bleeding.  He has been evaluated thoroughly for this. 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  We will go ahead and give a dose of iron today.  We will also give him a dose of Aranesp today.  I want to check his blood sugars to see what they are.  Will plan to get him back in another month for follow-up.    ______________________________ [name], M.D. PRE/MEDQ  D:  08/16/2012  T:  08/17/2012  Job:  [id number]

## 2012-08-23 ENCOUNTER — Ambulatory Visit (INDEPENDENT_AMBULATORY_CARE_PROVIDER_SITE_OTHER): Payer: Medicare Other | Admitting: Internal Medicine

## 2012-08-23 ENCOUNTER — Encounter: Payer: Self-pay | Admitting: Internal Medicine

## 2012-08-23 VITALS — BP 118/70 | HR 103 | Temp 98.7°F | Resp 10 | Ht 67.0 in | Wt 163.0 lb

## 2012-08-23 DIAGNOSIS — E1165 Type 2 diabetes mellitus with hyperglycemia: Secondary | ICD-10-CM

## 2012-08-23 MED ORDER — INSULIN LISPRO 100 UNIT/ML ~~LOC~~ SOLN: SUBCUTANEOUS | Status: DC

## 2012-08-23 MED ORDER — "INSULIN SYRINGE-NEEDLE U-100 31G X 5/16"" 0.5 ML MISC": Status: DC

## 2012-08-23 NOTE — Patient Instructions (Signed)
Please return in 2 weeks with your sugar log. Use pens for Humalog, too.  Use 7 units Humalog for breakfast, 7 units for lunch and 5 for dinner. If you plan to exercise or exert yourself after a meal, please take half of the above dose.  Continue the sliding scale.

## 2012-08-23 NOTE — Progress Notes (Signed)
Patient ID: [name], male   DOB: [birth year], 67 y.o.   MRN: [id number] HPI: [name] is a 67 y.o.-year-old male, returning for f/u for DM2, dx 1994, insulin-dependent, uncontrolled, with complications (CKD stage 3, peripheral neuropathy, ED). Last visit 1 mo ago.   Pt's insulin was added in 1996. Last hemoglobin A1c was: Lab Results  Component Value Date   HGBA1C 9.2* 05/24/2012    Pt is on a regimen of: - Metformin 500 mg po bid - Lantus 45, decreased from 58 units qhs (uses pens) - Humalog: - 5 units with a small meal - 7 units with a normal meal - 9 units with a large meal Also, + the following sliding scale: - 150-200: +2 units - 201-250: +3 units - 251-300: +4 units - >301: +5 units This was changed at last visit from: from 5 units tid ac + SSI: 200-300: + 3 units, >300: 10 units (uses vials)  Pt checks his sugars 3-4 a day and they are a little better than at last visit, but still very variable. He only brings a log up to 05/14, not for last month..he has sugars b/w 63-400: - am: ~260, but saw 500's >> 120-400 now - before lunch: low 200s now - before dinner: 300s or highest >> 200s now - bedtime: 130-180 now, last mo had a 63 Had lows~ 1-2 a week. Lowest sugar was 57 - felt it (last evening);  he has hypoglycemia awareness at 70. Highest sugar was 400 (at last visit, it was 555).  Pt's meals are: - Breakfast: grits, egg, liver pudding - Lunch: PB+crackers or nothing - Dinner: meat+veggies+starch - Snacks: banana  Pt has chronic kidney disease stage 3, last BUN/creatinine was:  Lab Results  Component Value Date   BUN 13 05/24/2012   CREATININE 1.26 05/24/2012   Last set of lipids: Lab Results  Component Value Date   CHOL 173 01/29/2012   HDL 33* 01/29/2012   LDLCALC 110* 01/29/2012   TRIG 149 01/29/2012   CHOLHDL 5.2 01/29/2012   Pt's last eye exam was in Spring of last year - Dr. Hazle Quant. ? No DR. Has numbness and tingling in his legs.  He also has a  history of HTN, microscopic hematuria, gastro- and laryngo-esophageal reflux with hoarseness, IDA, anemia of chronic renal ds., HL. Started Florinef ~ 1 mo ago for orthostatic hypotension and dizziness. No regular exercise.  ROS: Constitutional: no weight gain/loss, no fatigue, no subjective hyperthermia/hypothermia Eyes: no blurry vision, no xerophthalmia ENT: no sore throat, no nodules palpated in throat, no dysphagia/odynophagia, no hoarseness Cardiovascular: no CP/SOB/palpitations/leg swelling Respiratory: no cough/SOB Gastrointestinal: no N/V/D/C Musculoskeletal: no muscle/joint aches Skin: no rashes Neurological: no tremors/numbness/tingling/dizziness Psychiatric: no depression/anxiety  I reviewed pt's medications, allergies, PMH, social hx, family hx and no changes required, except as mentioned above.  PE: BP 118/70  Pulse 103  Temp(Src) 98.7 F (37.1 C) (Oral)  Resp 10  Ht 5\' 7"  (1.702 m)  Wt 163 lb (73.936 kg)  BMI 25.52 kg/m2  SpO2 96% Wt Readings from Last 3 Encounters:  08/23/12 163 lb (73.936 kg)  08/16/12 164 lb (74.39 kg)  08/11/12 162 lb (73.483 kg)   Constitutional: normal weight, in NAD Eyes: PERRLA, EOMI, no exophthalmos ENT: moist mucous membranes, no thyromegaly, no cervical lymphadenopathy Cardiovascular: RRR, No MRG Respiratory: CTA B Gastrointestinal: abdomen soft, NT, ND, BS+ Musculoskeletal: no deformities, strength intact in all 4 Skin: moist, warm, no rashes Neurological: no tremor with outstretched hands, DTR  normal in all 4  ASSESSMENT: 1. DM2, insulin-dependent, uncontrolled, with complications - CKD 3 - ED - peripheral neuropathy  PLAN:  1. Pt with long-standing DM2, uncontrolled, on a large dose of basal compared to mealtime insulin, with fluctuating sugars - we initially decreased the Lantus dose, but he still has few lows. These are mostly before dinner, after he skips lunch or exerts himself, but can also be at bedtime (he is not  sure why these happen).  - For now, we will keep the Lantus at 45 units - continue current Metformin dose 500 mg bid - since his sugars are in the 200s before lunch and mostly before dinner (except for the conditions above), will increase the am and lunchtime Humalog to 7 units. Will keep dinnertime Humalog at 5 units.Will continue the SSI with target 150 and ISF 25. - I strongly advised him to try to have lunch everyday or have glucose tablets with him at take 4 tabs if skips lunch. - given sugar log and advised how to fill it and to bring it at next appt, strongly advised him to add comments to his log to see if these explain the fluctuating sugars. We might need an iPro CGM for 5 days... - will need a repeat HbA1C at next visit. - I will see him back in 2 weeks with his sugar log - I advised them to join MyChart and let me know about his sugars before next appt

## 2012-08-24 ENCOUNTER — Telehealth: Payer: Self-pay | Admitting: *Deleted

## 2012-08-24 NOTE — Telephone Encounter (Signed)
Received fax from [facility] requesting order for AFG stabilizer for both feet. Spoke with pt and he states he did not request this product. Fax returned to [phone] with denial notation.

## 2012-09-14 ENCOUNTER — Other Ambulatory Visit: Payer: Self-pay | Admitting: Family

## 2012-09-20 ENCOUNTER — Encounter (HOSPITAL_BASED_OUTPATIENT_CLINIC_OR_DEPARTMENT_OTHER): Payer: Self-pay | Admitting: *Deleted

## 2012-09-20 ENCOUNTER — Emergency Department (HOSPITAL_BASED_OUTPATIENT_CLINIC_OR_DEPARTMENT_OTHER): Payer: Medicare Other

## 2012-09-20 ENCOUNTER — Observation Stay (HOSPITAL_BASED_OUTPATIENT_CLINIC_OR_DEPARTMENT_OTHER)
Admission: EM | Admit: 2012-09-20 | Discharge: 2012-09-22 | Disposition: A | Discharge status: Home / Self Care | Payer: Medicare Other | Attending: Internal Medicine | Admitting: Internal Medicine

## 2012-09-20 ENCOUNTER — Telehealth: Payer: Self-pay | Admitting: Family Medicine

## 2012-09-20 DIAGNOSIS — D509 Iron deficiency anemia, unspecified: Secondary | ICD-10-CM | POA: Insufficient documentation

## 2012-09-20 DIAGNOSIS — K7689 Other specified diseases of liver: Secondary | ICD-10-CM | POA: Insufficient documentation

## 2012-09-20 DIAGNOSIS — R0789 Other chest pain: Secondary | ICD-10-CM

## 2012-09-20 DIAGNOSIS — N400 Enlarged prostate without lower urinary tract symptoms: Secondary | ICD-10-CM | POA: Insufficient documentation

## 2012-09-20 DIAGNOSIS — E785 Hyperlipidemia, unspecified: Secondary | ICD-10-CM | POA: Insufficient documentation

## 2012-09-20 DIAGNOSIS — Z79899 Other long term (current) drug therapy: Secondary | ICD-10-CM | POA: Insufficient documentation

## 2012-09-20 DIAGNOSIS — Z860100 Personal history of colon polyps, unspecified: Secondary | ICD-10-CM | POA: Insufficient documentation

## 2012-09-20 DIAGNOSIS — I10 Essential (primary) hypertension: Secondary | ICD-10-CM

## 2012-09-20 DIAGNOSIS — E1142 Type 2 diabetes mellitus with diabetic polyneuropathy: Secondary | ICD-10-CM | POA: Insufficient documentation

## 2012-09-20 DIAGNOSIS — E1149 Type 2 diabetes mellitus with other diabetic neurological complication: Secondary | ICD-10-CM | POA: Insufficient documentation

## 2012-09-20 DIAGNOSIS — Z794 Long term (current) use of insulin: Secondary | ICD-10-CM | POA: Insufficient documentation

## 2012-09-20 DIAGNOSIS — I129 Hypertensive chronic kidney disease with stage 1 through stage 4 chronic kidney disease, or unspecified chronic kidney disease: Secondary | ICD-10-CM | POA: Insufficient documentation

## 2012-09-20 DIAGNOSIS — N183 Chronic kidney disease, stage 3 unspecified: Secondary | ICD-10-CM | POA: Insufficient documentation

## 2012-09-20 DIAGNOSIS — R3129 Other microscopic hematuria: Secondary | ICD-10-CM | POA: Insufficient documentation

## 2012-09-20 DIAGNOSIS — G8929 Other chronic pain: Secondary | ICD-10-CM | POA: Insufficient documentation

## 2012-09-20 DIAGNOSIS — Z23 Encounter for immunization: Secondary | ICD-10-CM | POA: Insufficient documentation

## 2012-09-20 DIAGNOSIS — M545 Low back pain, unspecified: Secondary | ICD-10-CM | POA: Insufficient documentation

## 2012-09-20 DIAGNOSIS — I2510 Atherosclerotic heart disease of native coronary artery without angina pectoris: Secondary | ICD-10-CM | POA: Insufficient documentation

## 2012-09-20 DIAGNOSIS — R079 Chest pain, unspecified: Principal | ICD-10-CM | POA: Insufficient documentation

## 2012-09-20 DIAGNOSIS — K219 Gastro-esophageal reflux disease without esophagitis: Secondary | ICD-10-CM | POA: Insufficient documentation

## 2012-09-20 HISTORY — DX: Chronic kidney disease, stage 3 unspecified: N18.30

## 2012-09-20 HISTORY — DX: Fatty (change of) liver, not elsewhere classified: K76.0

## 2012-09-20 HISTORY — DX: Atherosclerotic heart disease of native coronary artery without angina pectoris: I25.10

## 2012-09-20 HISTORY — DX: Type 2 diabetes mellitus without complications: E11.9

## 2012-09-20 HISTORY — DX: Benign prostatic hyperplasia without lower urinary tract symptoms: N40.0

## 2012-09-20 HISTORY — DX: Orthostatic hypotension: I95.1

## 2012-09-20 HISTORY — DX: Sleep apnea, unspecified: G47.30

## 2012-09-20 HISTORY — DX: Polyp of colon: K63.5

## 2012-09-20 HISTORY — DX: Hematuria, unspecified: R31.9

## 2012-09-20 HISTORY — DX: Gastrointestinal hemorrhage, unspecified: K92.2

## 2012-09-20 HISTORY — DX: Low back pain, unspecified: M54.50

## 2012-09-20 HISTORY — DX: Low back pain: M54.5

## 2012-09-20 HISTORY — DX: Type 2 diabetes mellitus with diabetic polyneuropathy: E11.42

## 2012-09-20 HISTORY — DX: Angiodysplasia of stomach and duodenum without bleeding: K31.819

## 2012-09-20 HISTORY — DX: Other chronic pain: G89.29

## 2012-09-20 HISTORY — DX: Chronic kidney disease, stage 3 (moderate): N18.3

## 2012-09-20 LAB — CBC WITH DIFFERENTIAL/PLATELET
Eosinophils Absolute: 0.1 10*3/uL (ref 0.0–0.7)
Eosinophils Relative: 2 % (ref 0–5)
Lymphocytes Relative: 24 % (ref 12–46)
MCH: 29.7 pg (ref 26.0–34.0)
Myelocytes: 0 %
Neutro Abs: 3.4 10*3/uL (ref 1.7–7.7)
Neutrophils Relative %: 68 % (ref 43–77)
Platelets: 244 10*3/uL (ref 150–400)
Promyelocytes Absolute: 0 %
RBC: 3.97 MIL/uL — ABNORMAL LOW (ref 4.22–5.81)
WBC: 5 10*3/uL (ref 4.0–10.5)
nRBC: 0 /100 WBC

## 2012-09-20 LAB — COMPREHENSIVE METABOLIC PANEL
AST: 13 U/L (ref 0–37)
BUN: 14 mg/dL (ref 6–23)
CO2: 24 mEq/L (ref 19–32)
Chloride: 102 mEq/L (ref 96–112)
Creatinine, Ser: 1.1 mg/dL (ref 0.50–1.35)
GFR calc Af Amer: 79 mL/min — ABNORMAL LOW (ref >90)
GFR calc non Af Amer: 69 mL/min — ABNORMAL LOW (ref >90)
Glucose, Bld: 372 mg/dL — ABNORMAL HIGH (ref 70–99)
Total Bilirubin: 0.3 mg/dL (ref 0.3–1.2)

## 2012-09-20 LAB — GLUCOSE, CAPILLARY: Glucose-Capillary: 258 mg/dL — ABNORMAL HIGH (ref 70–99)

## 2012-09-20 LAB — TROPONIN I: Troponin I: <0.3 ng/mL (ref <0.30)

## 2012-09-20 LAB — PROTIME-INR
INR: 0.94 (ref 0.00–1.49)
Prothrombin Time: 12.4 seconds (ref 11.6–15.2)

## 2012-09-20 MED ORDER — CARVEDILOL 3.125 MG PO TABS
3.1250 mg | ORAL_TABLET | Freq: Two times a day (BID) | ORAL | Status: DC
  Administered 2012-09-20 – 2012-09-22 (×5): 3.125 mg via ORAL
  Filled 2012-09-20 (×6): qty 1

## 2012-09-20 MED ORDER — INSULIN ASPART 100 UNIT/ML ~~LOC~~ SOLN
0.0000 [IU] | Freq: Three times a day (TID) | SUBCUTANEOUS | Status: DC
  Administered 2012-09-21: 15 [IU] via SUBCUTANEOUS
  Administered 2012-09-21: 5 [IU] via SUBCUTANEOUS
  Administered 2012-09-22: 2 [IU] via SUBCUTANEOUS
  Administered 2012-09-22: 8 [IU] via SUBCUTANEOUS

## 2012-09-20 MED ORDER — INSULIN GLARGINE 100 UNIT/ML ~~LOC~~ SOLN
45.0000 [IU] | Freq: Every day | SUBCUTANEOUS | Status: DC
  Administered 2012-09-21: 45 [IU] via SUBCUTANEOUS
  Filled 2012-09-20 (×2): qty 0.45

## 2012-09-20 MED ORDER — ASPIRIN 81 MG PO TABS: 81.0000 mg | ORAL_TABLET | Freq: Every day | ORAL | Status: DC

## 2012-09-20 MED ORDER — SODIUM CHLORIDE 0.9 % IJ SOLN
3.0000 mL | Freq: Two times a day (BID) | INTRAMUSCULAR | Status: DC
  Administered 2012-09-20 – 2012-09-22 (×3): 3 mL via INTRAVENOUS

## 2012-09-20 MED ORDER — PNEUMOCOCCAL VAC POLYVALENT 25 MCG/0.5ML IJ INJ
0.5000 mL | INJECTION | INTRAMUSCULAR | Status: AC
  Administered 2012-09-22: 0.5 mL via INTRAMUSCULAR
  Filled 2012-09-20 (×2): qty 0.5

## 2012-09-20 MED ORDER — NITROGLYCERIN 0.4 MG SL SUBL
0.4000 mg | SUBLINGUAL_TABLET | SUBLINGUAL | Status: DC | PRN
  Filled 2012-09-20: qty 25

## 2012-09-20 MED ORDER — ACETAMINOPHEN 325 MG PO TABS
650.0000 mg | ORAL_TABLET | ORAL | Status: DC | PRN
  Administered 2012-09-22: 650 mg via ORAL

## 2012-09-20 MED ORDER — OXYCODONE-ACETAMINOPHEN 5-325 MG PO TABS: 1.0000 | ORAL_TABLET | ORAL | Status: DC | PRN

## 2012-09-20 MED ORDER — SODIUM CHLORIDE 0.9 % IV SOLN
1.0000 mL/kg/h | INTRAVENOUS | Status: DC
  Administered 2012-09-21: 1 mL/kg/h via INTRAVENOUS

## 2012-09-20 MED ORDER — SODIUM CHLORIDE 0.9 % IJ SOLN: 3.0000 mL | INTRAMUSCULAR | Status: DC | PRN

## 2012-09-20 MED ORDER — ASPIRIN EC 81 MG PO TBEC
81.0000 mg | DELAYED_RELEASE_TABLET | Freq: Every day | ORAL | Status: DC
  Filled 2012-09-20: qty 1

## 2012-09-20 MED ORDER — HEPARIN SODIUM (PORCINE) 5000 UNIT/ML IJ SOLN
5000.0000 [IU] | Freq: Three times a day (TID) | INTRAMUSCULAR | Status: DC
  Administered 2012-09-20 – 2012-09-21 (×2): 5000 [IU] via SUBCUTANEOUS
  Filled 2012-09-20 (×5): qty 1

## 2012-09-20 MED ORDER — FLUDROCORTISONE ACETATE 0.1 MG PO TABS
0.1000 mg | ORAL_TABLET | Freq: Every day | ORAL | Status: DC
  Administered 2012-09-21 – 2012-09-22 (×2): 0.1 mg via ORAL
  Filled 2012-09-20 (×3): qty 1

## 2012-09-20 MED ORDER — ASPIRIN EC 81 MG PO TBEC
81.0000 mg | DELAYED_RELEASE_TABLET | Freq: Every day | ORAL | Status: DC
  Administered 2012-09-22: 81 mg via ORAL
  Filled 2012-09-20 (×2): qty 1

## 2012-09-20 MED ORDER — INSULIN GLARGINE 100 UNIT/ML ~~LOC~~ SOLN
23.0000 [IU] | Freq: Every day | SUBCUTANEOUS | Status: AC
  Administered 2012-09-20: 23 [IU] via SUBCUTANEOUS
  Filled 2012-09-20: qty 0.23

## 2012-09-20 MED ORDER — ASPIRIN 81 MG PO CHEW
324.0000 mg | CHEWABLE_TABLET | Freq: Once | ORAL | Status: AC
  Administered 2012-09-20: 324 mg via ORAL
  Filled 2012-09-20: qty 4

## 2012-09-20 MED ORDER — ONDANSETRON HCL 4 MG/2ML IJ SOLN
4.0000 mg | Freq: Four times a day (QID) | INTRAMUSCULAR | Status: DC | PRN
  Filled 2012-09-20: qty 2

## 2012-09-20 MED ORDER — SODIUM CHLORIDE 0.9 % IV BOLUS (SEPSIS)
1000.0000 mL | Freq: Once | INTRAVENOUS | Status: AC
  Administered 2012-09-20: 1000 mL via INTRAVENOUS

## 2012-09-20 MED ORDER — SODIUM CHLORIDE 0.9 % IV SOLN: 250.0000 mL | INTRAVENOUS | Status: DC | PRN

## 2012-09-20 MED ORDER — FLUTICASONE PROPIONATE 50 MCG/ACT NA SUSP
2.0000 | Freq: Every day | NASAL | Status: DC
  Administered 2012-09-22: 2 via NASAL
  Filled 2012-09-20: qty 16

## 2012-09-20 MED ORDER — NITROGLYCERIN 0.4 MG SL SUBL: 0.4000 mg | SUBLINGUAL_TABLET | SUBLINGUAL | Status: DC | PRN

## 2012-09-20 MED ORDER — PANTOPRAZOLE SODIUM 40 MG PO TBEC
80.0000 mg | DELAYED_RELEASE_TABLET | Freq: Two times a day (BID) | ORAL | Status: DC
  Administered 2012-09-20 – 2012-09-22 (×4): 80 mg via ORAL
  Filled 2012-09-20 (×4): qty 2

## 2012-09-20 MED ORDER — ATORVASTATIN CALCIUM 80 MG PO TABS
80.0000 mg | ORAL_TABLET | Freq: Every day | ORAL | Status: DC
  Administered 2012-09-20 – 2012-09-22 (×3): 80 mg via ORAL
  Filled 2012-09-20 (×3): qty 1

## 2012-09-20 MED ORDER — TAMSULOSIN HCL 0.4 MG PO CAPS
0.4000 mg | ORAL_CAPSULE | Freq: Every day | ORAL | Status: DC
  Administered 2012-09-20 – 2012-09-21 (×2): 0.4 mg via ORAL
  Filled 2012-09-20 (×3): qty 1

## 2012-09-20 NOTE — ED Provider Notes (Signed)
Medical screening examination/treatment/procedure(s) were conducted as a shared visit with non-physician practitioner(s) and myself.  I personally evaluated the patient during the encounter 67 yo man had new onset of precordial chest pain that woke him up this AM and lasted about an hour.  He is diabetic and hypertensive, followed by Dr. Abner Greenspan of [facility].  PE is benign.  EKG is nonacute, blood sugar is elevated in high 300's, and 2 TNI's are negative.  Call to Christain Sacramento, PA-C for [facility], who accepts him on behalf of Dr. Gala Romney for chest pain observation at [facility].  Carleene Cooper III, MD 09/20/12 1944

## 2012-09-20 NOTE — H&P (Signed)
Note signed in error, see other H/p

## 2012-09-20 NOTE — ED Provider Notes (Signed)
History    CSN: [id number] Arrival date & time 09/20/12  7829  First MD Initiated Contact with Patient 09/20/12 [phone]     Chief Complaint  Patient presents with  . Chest Pain   (Consider location/radiation/quality/duration/timing/severity/associated sxs/prior Treatment) HPI  Patient is a 67 year old male past medical history significant for HTN, DM, Anemia, GERD presented to emergency department for sudden onset of centralized chest pain without radiation that awoke the patient this morning around 5:30 AM. He should states the pain started off as sharp but now states it is more of a pressure. He rate his pain as 4/10. Patient denies any shortness of breath, nausea, vomiting, diaphoresis, headaches associated with stress pain. Patient states he has no history of chest pain at rest or with exertion. Patient states is not cardiologist is never had a cardiac catheterization done.    Past Medical History  Diagnosis Date  . Anemia, iron deficiency 03/24/2011  . 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 Hypertension   . Diabetes mellitus   . High cholesterol   . GERD (gastroesophageal reflux disease)   . Neuropathy   . Bronchitis 03/06/2012   Past Surgical History  Procedure Laterality Date  . Back surgery    . Rotator cuff repair     History reviewed. No pertinent family history. History  Substance Use Topics  . Smoking status: Former Smoker    Quit date: 03/17/2012  . Smokeless tobacco: Not on file  . Alcohol Use: No    Review of Systems  Constitutional: Negative for fever and chills.  HENT: Negative.   Eyes: Negative.  Negative for visual disturbance.  Respiratory: Negative for cough and shortness of breath.   Cardiovascular: Positive for chest pain. Negative for palpitations and leg swelling.  Gastrointestinal: Negative for nausea, vomiting and abdominal pain.  Genitourinary: Negative.   Musculoskeletal: Negative for back pain.  Skin: Negative.   Neurological: Negative for  headaches.    Allergies  Review of patient's allergies indicates no known allergies.  Home Medications   Current Outpatient Rx  Name  Route  Sig  Dispense  Refill  . atorvastatin (LIPITOR) 80 MG tablet   Oral   Take 1 tablet (80 mg total) by mouth daily.   30 tablet   2   . fludrocortisone (FLORINEF) 0.1 MG tablet   Oral   Take 1 tablet (0.1 mg total) by mouth daily.   30 tablet   2   . fluticasone (FLONASE) 50 MCG/ACT nasal spray   Nasal   Place 2 sprays into the nose daily.   16 g   6   . insulin glargine (LANTUS) 100 UNIT/ML injection   Subcutaneous   Inject 0.45 mLs (45 Units total) into the skin at bedtime.   5 pen   6   . insulin lispro (HUMALOG) 100 UNIT/ML injection      Use as advised - maximum 25 units per day. Take 7-7-5 units plus SSI as advised.   10 mL   12     Pens, please.   . Insulin Syringe-Needle U-100 (BD INSULIN SYRINGE ULTRAFINE) 31G X 5/16" 0.5 ML MISC      Use 3x a day as advised   100 each   1   . metFORMIN (GLUCOPHAGE) 500 MG tablet   Oral   Take 1 tablet (500 mg total) by mouth 2 (two) times daily with a meal.   60 tablet   3   . oxyCODONE-acetaminophen (PERCOCET/ROXICET) 5-325 MG per tablet  Oral   Take 2 tablets by mouth every 4 (four) hours as needed for pain.   15 tablet   0   . aspirin 81 MG tablet   Oral   Take 81 mg by mouth daily.         . clotrimazole-betamethasone (LOTRISONE) cream   Topical   Apply 1 application topically as needed.          . furosemide (LASIX) 20 MG tablet      One by mouth in the morning as needed for swelling         . NEXIUM 40 MG capsule   Oral   Take 40 mg by mouth 2 (two) times daily.          . tamsulosin (FLOMAX) 0.4 MG CAPS      0.4 mg daily after supper.          . vardenafil (LEVITRA) 10 MG tablet   Oral   Take 1 tablet (10 mg total) by mouth daily as needed for erectile dysfunction.   6 tablet   2   . VIGAMOX 0.5 % ophthalmic solution   Both Eyes    Place 1 drop into both eyes 3 (three) times daily.           BP 153/79  Pulse 84  Temp(Src) 97.7 F (36.5 C) (Oral)  Resp 18  SpO2 100% Physical Exam  Constitutional: He is oriented to person, place, and time. He appears well-developed and well-nourished. No distress.  HENT:  Head: Normocephalic and atraumatic.  Mouth/Throat: Oropharynx is clear and moist. No oropharyngeal exudate.  Eyes: Conjunctivae are normal.  Neck: Neck supple.  Cardiovascular: Normal rate, regular rhythm and normal heart sounds.   No murmur heard. Pulmonary/Chest: Effort normal and breath sounds normal. No respiratory distress. He exhibits no tenderness.  Abdominal: Soft. Bowel sounds are normal. There is no tenderness.  Musculoskeletal: He exhibits no edema.  Lymphadenopathy:    He has no cervical adenopathy.  Neurological: He is alert and oriented to person, place, and time.  Skin: Skin is warm and dry. He is not diaphoretic.    ED Course  Procedures (including critical care time)  Medications  nitroGLYCERIN (NITROSTAT) SL tablet 0.4 mg (not administered)  aspirin chewable tablet 324 mg (324 mg Oral Given 09/20/12 1021)  sodium chloride 0.9 % bolus 1,000 mL (0 mLs Intravenous Stopped 09/20/12 1156)  sodium chloride 0.9 % bolus 1,000 mL (0 mLs Intravenous Stopped 09/20/12 1328)    Date: 09/20/2012  Rate: 88  Rhythm: normal sinus rhythm  QRS Axis: normal  Intervals: normal  ST/T Wave abnormalities: normal  Conduction Disutrbances:none  Narrative Interpretation:   Old EKG Reviewed: unchanged    Labs Reviewed  GLUCOSE, CAPILLARY - Abnormal; Notable for the following:    Glucose-Capillary 345 (*)    All other components within normal limits  COMPREHENSIVE METABOLIC PANEL - Abnormal; Notable for the following:    Glucose, Bld 372 (*)    Albumin 2.6 (*)    GFR calc non Af Amer 69 (*)    GFR calc Af Amer 79 (*)    All other components within normal limits  CBC WITH DIFFERENTIAL - Abnormal;  Notable for the following:    RBC 3.97 (*)    Hemoglobin 11.8 (*)    HCT 33.7 (*)    RDW 15.8 (*)    All other components within normal limits  GLUCOSE, CAPILLARY - Abnormal; Notable for the following:    Glucose-Capillary  258 (*)    All other components within normal limits  TROPONIN I  TROPONIN I   Dg Chest Portable 1 View  09/20/2012   *RADIOLOGY REPORT*  Clinical Data: Mid chest tightness.  Hypertension.  PORTABLE CHEST - 1 VIEW  Comparison: 06/21/2012  Findings: Mild thoracic spondylosis observed.  Cardiac and mediastinal contours appear normal.  The lungs appear clear.  No pleural effusion is identified.  IMPRESSION:  1.  No acute findings to explain the patient's sensation of chest tightness. 2.  Mild thoracic spondylosis.   Original Report Authenticated By: Gaylyn Rong, M.D.   1. Chest pain     MDM  Concern for cardiac etiology of Chest Pain. Cardiology has been consulted and requesting patient be transferred to [facility] under Dr. Prescott Gum service. for admission for CP r/u. Pt does not meet criteria for CP protocol. Pt has been re-evaluated prior to consult and VSS, NAD, heart RRR, pain 0/10, lungs CTAB. No acute abnormalities found on EKG and first two rounds of cardiac enzymes negative. This case was discussed with Dr. Ignacia Palma who has seen the patient and agrees with plan to admit. EMTALA form has been filed. Admission orders placed. Patient ready for transfer.     Jeannetta Ellis, PA-C 09/20/12 1444

## 2012-09-20 NOTE — H&P (Addendum)
History and Physical  Patient ID: [name] MRN: [id number], DOB: 1945-04-22 Date of Encounter: 09/20/2012, 5:20 PM Primary Physician: Lemont Fillers., NP Primary Cardiologist: New to [facility]  Chief Complaint: chest pain Reason for Admission: chest pain  HPI: Mr. Ress is a 67 y/o M with history of longstanding uncontrolled DM with subsequent CKD stage III, HTN, HL, former tobacco abuse, recurrent GIB/iron deficiency anemia, and recent microscopic hematuria who presented as a transfer to [facility] from [facility] with chest pain. He has no prior cardiac history but chest CT 06/2012 (done to f/u a previously enlarged lymph node) showed coronary artery calcification. 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 The patient states he had a few spots cauterized and has had no problems with melena or BRBPR since. He receives periodic iron infusions through hematology clinic at [facility] and also receives intermittent Aranesp. He was found to have microscopic hematuria in 06/2012 and has been referred to Urology - cystoscopy showed normal bladder, renal US with bilateral benign-appearing cysts & slightly increased echogenic texture which could represent chronic medical renal disease. He was started on BPH treatment and states he was scheduled to have "some tests" at the end of this month. Hgb is 11.8 today (9.3 in June 2014, previously 11.7 in April 2014). He denies any frank hematuria.  This morning when he awoke at 5:30am, he noted central chest "soreness." It was not associated with any SOB, nausea, vomiting, diaphoresis, palpitations, or syncope. It did not radiate. He got up to go to the bathroom and felt no different so laid back in bed for several hours until 8am. The chest pain was subsiding but because it was still present, he told his wife who called his PCP. He was subsequently  referred to the ED. By the time he arrived, he was pain free. In all, pain lasted about 2.5 hours. The pain was not made worse with inspiration, exertion, palpation or movement. He did not try anything at home for the pain. He received 4 baby ASA in the [facility]. Troponins are negative x 2. CXR nonacute. Glucose 372 on admission - pt states his CBGs run anywhere from 125-400s at home. He admits to dietary noncompliance. He dose endorse med compliance. He does not exercise due to history of prior back surgery.  Past Medical History  Diagnosis Date  . Anemia, iron deficiency 03/24/2011    a. Recurrent GI bleed, tx with periodic iron infusions.  . 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 Hypertension   . Diabetes mellitus     a. Dx 1994, uncontrolled.   . High cholesterol   . GERD (gastroesophageal reflux disease)   . Neuropathy   . Bronchitis 03/06/2012  . CKD (chronic kidney disease)     a. Stage 3 (DM with complications ->CKD, peripheral neuropathy).  . GI bleed     a. Recurrent GI bleed, tx with IV iron. b. Per heme notes - likely AVMs 04/2012 (tx with cauterization several months ago).  . Orthostatic hypotension     a. Tx with florinef.  . Hematuria     a. per OV 06/2012.     Most Recent Cardiac Studies: None   Surgical History:  Past Surgical History  Procedure Laterality Date  . Back surgery    . Rotator cuff repair    . Hernia repair  Home Meds: Prior to Admission medications   Medication Sig Start Date End Date Taking? Authorizing Provider  atorvastatin (LIPITOR) 80 MG tablet Take 1 tablet (80 mg total) by mouth daily. 09/14/12  Yes Sandford Craze, NP  fludrocortisone (FLORINEF) 0.1 MG tablet Take 1 tablet (0.1 mg total) by mouth daily. 07/05/12  Yes Sandford Craze, NP  fluticasone (FLONASE) 50 MCG/ACT nasal spray Place 2 sprays into the nose daily. 05/24/12  Yes Sandford Craze, NP  insulin glargine (LANTUS) 100 UNIT/ML injection Inject 0.45 mLs (45 Units total)  into the skin at bedtime. 07/19/12  Yes Carlus Pavlov, MD  insulin lispro (HUMALOG) 100 UNIT/ML injection Use as advised - maximum 25 units per day. Take 7-7-5 units plus SSI as advised. 08/23/12  Yes Carlus Pavlov, MD  Insulin Syringe-Needle U-100 (BD INSULIN SYRINGE ULTRAFINE) 31G X 5/16" 0.5 ML MISC Use 3x a day as advised 08/23/12  Yes Carlus Pavlov, MD  metFORMIN (GLUCOPHAGE) 500 MG tablet Take 1 tablet (500 mg total) by mouth 2 (two) times daily with a meal. 06/21/12  Yes Sandford Craze, NP  oxyCODONE-acetaminophen (PERCOCET/ROXICET) 5-325 MG per tablet Take 2 tablets by mouth every 4 (four) hours as needed for pain. 08/11/12  Yes Glynn Octave, MD  aspirin 81 MG tablet Take 81 mg by mouth daily.    Historical Provider, MD  clotrimazole-betamethasone (LOTRISONE) cream Apply 1 application topically as needed.  07/06/12   Historical Provider, MD  furosemide (LASIX) 20 MG tablet One by mouth in the morning as needed for swelling 02/03/12   Edwyna Perfect, MD  NEXIUM 40 MG capsule Take 40 mg by mouth 2 (two) times daily.  05/03/11   Historical Provider, MD  tamsulosin (FLOMAX) 0.4 MG CAPS 0.4 mg daily after supper.  07/16/12   Historical Provider, MD  vardenafil (LEVITRA) 10 MG tablet Take 1 tablet (10 mg total) by mouth daily as needed for erectile dysfunction. 02/03/12   Edwyna Perfect, MD  VIGAMOX 0.5 % ophthalmic solution Place 1 drop into both eyes 3 (three) times daily.  12/18/11   Historical Provider, MD    Allergies: No Known Allergies  History   Social History  . Marital Status: Married    Spouse Name: N/A    Number of Children: N/A  . Years of Education: N/A   Occupational History  . taken out of work due to back    Social History Main Topics  . Smoking status: Former Smoker    Quit date: 03/17/2012  . Smokeless tobacco: Not on file     Comment: Smoked about 30 years  . Alcohol Use: No     Comment: Used to, none now  . Drug Use: No  . Sexually Active: Not on file    Other Topics Concern  . Not on file   Social History Narrative  . No narrative on file     Family History  Problem Relation Age of Onset  . Heart attack Brother     Died at 68  . Stroke Father     Died at 13    Review of Systems: General: negative for chills, fever, night sweats or weight changes.  Cardiovascular: negative for edema, orthopnea, palpitations, paroxysmal nocturnal dyspnea, shortness of breath or dyspnea on exertion Dermatological: negative for rash Respiratory: negative for cough or wheezing Urologic: negative for hematuria Abdominal: negative for nausea, vomiting, diarrhea, bright red blood per rectum, melena, or hematemesis Neurologic: negative for visual changes, syncope All other systems reviewed and are otherwise  negative except as noted above.  Labs:   Lab Results  Component Value Date   WBC 5.0 09/20/2012   HGB 11.8* 09/20/2012   HCT 33.7* 09/20/2012   MCV 84.9 09/20/2012   PLT 244 09/20/2012    Recent Labs Lab 09/20/12 1000  NA 136  K 4.0  CL 102  CO2 24  BUN 14  CREATININE 1.10  CALCIUM 9.0  PROT 6.4  BILITOT 0.3  ALKPHOS 115  ALT 16  AST 13  GLUCOSE 372*    Recent Labs  09/20/12 1000 09/20/12 1306  TROPONINI <0.30 <0.30   Lab Results  Component Value Date   CHOL 173 01/29/2012   HDL 33* 01/29/2012   LDLCALC 110* 01/29/2012   TRIG 149 01/29/2012    Radiology/Studies:  Dg Chest Portable 1 View 09/20/2012   *RADIOLOGY REPORT*  Clinical Data: Mid chest tightness.  Hypertension.  PORTABLE CHEST - 1 VIEW  Comparison: 06/21/2012  Findings: Mild thoracic spondylosis observed.  Cardiac and mediastinal contours appear normal.  The lungs appear clear.  No pleural effusion is identified.  IMPRESSION:  1.  No acute findings to explain the patient's sensation of chest tightness. 2.  Mild thoracic spondylosis.   Original Report Authenticated By: Gaylyn Rong, M.D.     EKG: NSR 88bpm, u wave V4, otherwise no acute ST-T changes  Physical  Exam: Blood pressure 145/108, pulse 74, temperature 98.3 F (36.8 C), temperature source Oral, resp. rate 18, height 5\' 7"  (1.702 m), weight 162 lb 7.7 oz (73.7 kg), SpO2 95.00%. General: Well developed, well nourished [name] in no acute distress. Lying flat in bed. Head: Normocephalic, atraumatic, sclera non-icteric, no xanthomas, nares are without discharge.  Neck: Negative for carotid bruits. JVD not elevated. Lungs: Clear bilaterally to auscultation without wheezes, rales, or rhonchi. Breathing is unlabored. Heart: RRR with S1 S2. No murmurs, rubs, or gallops appreciated. Abdomen: Soft, non-tender, non-distended with normoactive bowel sounds. No hepatomegaly. No rebound/guarding. No obvious abdominal masses. Msk:  Strength and tone appear normal for age except for mild weakness LUE due to rotator cuff tear in past Extremities: No clubbing or cyanosis. No edema.  Distal pedal pulses are 2+ and equal bilaterally. Neuro: Alert and oriented X 3. No focal deficit. No facial asymmetry. Moves all extremities spontaneously. Psych:  Responds to questions appropriately with a normal affect.    ASSESSMENT AND PLAN:  1.  Midsternal chest pain, somewhat atypical but with extensive significant risk factors 2. Diabetes mellitus, likely continues to be uncontrolled 3. HTN with history of orthostatic hypotension 4. Hyperlipidemia 5. CKD - reported stage 3 6. Iron deficiency anemia with reported recurrent GIB with likely AVM s/p cauterization 04/2012 7. Microscopic hematuria, followed by urology (cystoscopy without neoplasm, renal US with benign appearing cysts)  Mr. Lennox presents with new onset chest pain at rest, in the setting of longstanding DM, HTN, HL, 30 yrs former tobacco abuse, and family history of premature CAD (brother died at 59 y/o). Plan admit, cycle cardiac enzymes. He is currently pain free and enzymes negative so will hold off heparin unless enzymes turn positive. Hold Metformin. Give 1/2  dose Lantus tonight and resume full dose tomorrow. Moderate sliding scale insulin for now. Dr. Gala Romney went over the options comprehensively and the patient elects to proceed with cardiac cath. Will make him NPO after midnight and plan for this in the AM if creatinine remains stable. Watch BP. See below for additional thoughts.  Signed, Ronie Spies PA-C 09/20/2012, 5:20 PM   Mr.  Kutzer is 67 y/o with multiple CRFs (tobacco abuse, strong FHX, DM2 with end organ involvement, HTN) who presents with severe midsternal CP with typical and atypical features. CE and ECG are negative. We discussed stress testing versus cath. Given magnitude of RFs + coronary calcifications on recent chest CT we have decided to proceed with cath. It is important to note that he has a long h/o chronic slow GI bleeding for which he gets regular iron infusions. Thus if he needs PCI would likely need BMS and not DES despite h/o DM2.   Will arrange for cath tomorrow.   Truman Hayward 6:38 PM

## 2012-09-20 NOTE — Telephone Encounter (Signed)
Patient's wife called to schedule appt.,  She stated he didn't feel good,and was having tightness in chest, told her he needed to go ED, patient agreed,

## 2012-09-20 NOTE — Progress Notes (Signed)
Rec Pt from [facility]. Pt O4x no complaints of CP or SOB.

## 2012-09-20 NOTE — ED Provider Notes (Signed)
10:08 AM  Date: 09/20/2012  Rate: 88  Rhythm: normal sinus rhythm  QRS Axis: left  Intervals: normal QRS:  Poor R wave progression in precordial leads suggests possible old anterior myocardial infarction.  ST/T Wave abnormalities: normal  Conduction Disutrbances:none  Narrative Interpretation: Abnormal EKG.  Old EKG Reviewed: unchanged    Carleene Cooper III, MD 09/20/12 1010

## 2012-09-20 NOTE — ED Notes (Signed)
Woke up with centralized chest pain denies shortness of breath nausea vomiting or diaphoresis

## 2012-09-21 ENCOUNTER — Encounter (HOSPITAL_COMMUNITY)
Admission: EM | Disposition: A | Discharge status: Home / Self Care | Payer: Self-pay | Source: Home / Self Care | Attending: Internal Medicine

## 2012-09-21 DIAGNOSIS — I2510 Atherosclerotic heart disease of native coronary artery without angina pectoris: Secondary | ICD-10-CM

## 2012-09-21 HISTORY — PX: LEFT HEART CATHETERIZATION WITH CORONARY ANGIOGRAM: SHX5451

## 2012-09-21 LAB — GLUCOSE, CAPILLARY
Glucose-Capillary: 245 mg/dL — ABNORMAL HIGH (ref 70–99)
Glucose-Capillary: 375 mg/dL — ABNORMAL HIGH (ref 70–99)

## 2012-09-21 LAB — CBC
MCV: 83.5 fL (ref 78.0–100.0)
Platelets: 226 10*3/uL (ref 150–400)
RBC: 3.39 MIL/uL — ABNORMAL LOW (ref 4.22–5.81)
WBC: 4.6 10*3/uL (ref 4.0–10.5)

## 2012-09-21 LAB — BASIC METABOLIC PANEL
CO2: 27 mEq/L (ref 19–32)
Chloride: 103 mEq/L (ref 96–112)
Creatinine, Ser: 1.06 mg/dL (ref 0.50–1.35)
Sodium: 135 mEq/L (ref 135–145)

## 2012-09-21 LAB — LIPID PANEL: Cholesterol: 152 mg/dL (ref 0–200)

## 2012-09-21 LAB — TSH: TSH: 0.899 u[IU]/mL (ref 0.350–4.500)

## 2012-09-21 SURGERY — LEFT HEART CATHETERIZATION WITH CORONARY ANGIOGRAM
Anesthesia: LOCAL

## 2012-09-21 MED ORDER — NITROGLYCERIN 0.2 MG/ML ON CALL CATH LAB
INTRAVENOUS | Status: AC
  Filled 2012-09-21: qty 1

## 2012-09-21 MED ORDER — LIDOCAINE HCL (PF) 1 % IJ SOLN
INTRAMUSCULAR | Status: AC
  Filled 2012-09-21: qty 30

## 2012-09-21 MED ORDER — MIDAZOLAM HCL 2 MG/2ML IJ SOLN
INTRAMUSCULAR | Status: AC
  Filled 2012-09-21: qty 2

## 2012-09-21 MED ORDER — HEPARIN SODIUM (PORCINE) 1000 UNIT/ML IJ SOLN
INTRAMUSCULAR | Status: AC
  Filled 2012-09-21: qty 1

## 2012-09-21 MED ORDER — ASPIRIN 81 MG PO CHEW
324.0000 mg | CHEWABLE_TABLET | ORAL | Status: AC
  Administered 2012-09-21: 324 mg via ORAL
  Filled 2012-09-21: qty 4

## 2012-09-21 MED ORDER — HEPARIN (PORCINE) IN NACL 2-0.9 UNIT/ML-% IJ SOLN
INTRAMUSCULAR | Status: AC
  Filled 2012-09-21: qty 1000

## 2012-09-21 MED ORDER — FENTANYL CITRATE 0.05 MG/ML IJ SOLN
INTRAMUSCULAR | Status: AC
  Filled 2012-09-21: qty 2

## 2012-09-21 MED ORDER — SODIUM CHLORIDE 0.9 % IV SOLN: 1.0000 mL/kg/h | INTRAVENOUS | Status: AC

## 2012-09-21 MED ORDER — VERAPAMIL HCL 2.5 MG/ML IV SOLN
INTRAVENOUS | Status: AC
  Filled 2012-09-21: qty 2

## 2012-09-21 NOTE — Progress Notes (Signed)
Utilization review completed.  

## 2012-09-21 NOTE — CV Procedure (Signed)
   Cardiac Catheterization Procedure Note  Name: [name] MRN: [id number] DOB: [birth year]  Procedure: Left Heart Cath, Selective Coronary Angiography, LV angiography  Indication: 67 yo BM with multiple risk factors presents with symptoms of chest pain at rest.   Procedural Details: The right wrist was prepped, draped, and anesthetized with 1% lidocaine. Using the modified Seldinger technique, a 5 French sheath was introduced into the right radial artery. 3 mg of verapamil was administered through the sheath, weight-based unfractionated heparin was administered intravenously. Standard Judkins catheters were used for selective coronary angiography and left ventriculography. Catheter exchanges were performed over an exchange length guidewire. There were no immediate procedural complications. A TR band was used for radial hemostasis at the completion of the procedure.  The patient was transferred to the post catheterization recovery area for further monitoring.  Procedural Findings: Hemodynamics: AO 149/76 mean 107 mm Hg LV 147/11 mm Hg  Coronary angiography: Coronary dominance: right  Left mainstem: Normal  Left anterior descending (LAD): Focal 50-70% mid LAD stenosis. Small diagonal with diffuse 70% proximal disease.  Ramus intermediate is a large branch with focal 40-50% disease in the proximal vessel.  Left circumflex (LCx): minor wall irregularities.  Right coronary artery (RCA): 30% mid vessel.  Left ventriculography: Left ventricular systolic function is normal, LVEF is estimated at 55-65%, there is no significant mitral regurgitation   Final Conclusions:   1. 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 2. Normal LV function.  Recommendations: Medical management especially in light of his history of recurrent GI bleeds. Will hydrate. Repeat CBC in am.  Theron Arista [facility] 09/21/2012, 11:22 AM

## 2012-09-21 NOTE — Interval H&P Note (Signed)
History and Physical Interval Note:  09/21/2012 10:35 AM  [name]  has presented today for surgery, with the diagnosis of cp  The various methods of treatment have been discussed with the patient and family. After consideration of risks, benefits and other options for treatment, the patient has consented to  Procedure(s): LEFT HEART CATHETERIZATION WITH CORONARY ANGIOGRAM (N/A) as a surgical intervention .  The patient's history has been reviewed, patient examined, no change in status, stable for surgery.  I have reviewed the patient's chart and labs.  Questions were answered to the patient's satisfaction.  Patient's Hgb has dropped 2 grams overnight. This may be partially explained by hemodilution with hydration. Patient does have a heme positive stool. Will proceed with diagnostic cardiac cath only. If PCI needed will need GI evaluation.   Theron Arista [facility] 09/21/2012 10:37 AM

## 2012-09-22 ENCOUNTER — Encounter (HOSPITAL_COMMUNITY): Payer: Self-pay | Admitting: Physician Assistant

## 2012-09-22 DIAGNOSIS — D509 Iron deficiency anemia, unspecified: Secondary | ICD-10-CM

## 2012-09-22 LAB — BASIC METABOLIC PANEL
Calcium: 8.5 mg/dL (ref 8.4–10.5)
GFR calc Af Amer: 78 mL/min — ABNORMAL LOW (ref >90)
GFR calc non Af Amer: 67 mL/min — ABNORMAL LOW (ref >90)
Glucose, Bld: 158 mg/dL — ABNORMAL HIGH (ref 70–99)
Sodium: 140 mEq/L (ref 135–145)

## 2012-09-22 LAB — CBC
HCT: 29 % — ABNORMAL LOW (ref 39.0–52.0)
MCHC: 33.8 g/dL (ref 30.0–36.0)
Platelets: 240 10*3/uL (ref 150–400)
RDW: 16.5 % — ABNORMAL HIGH (ref 11.5–15.5)
WBC: 5.4 10*3/uL (ref 4.0–10.5)

## 2012-09-22 LAB — GLUCOSE, CAPILLARY: Glucose-Capillary: 258 mg/dL — ABNORMAL HIGH (ref 70–99)

## 2012-09-22 MED ORDER — VARDENAFIL HCL 10 MG PO TABS: 10.0000 mg | ORAL_TABLET | Freq: Every day | ORAL | Status: DC | PRN

## 2012-09-22 MED ORDER — NITROGLYCERIN 0.4 MG SL SUBL: 0.4000 mg | SUBLINGUAL_TABLET | SUBLINGUAL | Status: DC | PRN

## 2012-09-22 MED ORDER — METFORMIN HCL 500 MG PO TABS: 500.0000 mg | ORAL_TABLET | Freq: Two times a day (BID) | ORAL | Status: DC

## 2012-09-22 MED ORDER — ASPIRIN 81 MG PO TBEC: 81.0000 mg | DELAYED_RELEASE_TABLET | Freq: Every day | ORAL | Status: DC

## 2012-09-22 MED ORDER — CARVEDILOL 3.125 MG PO TABS: 3.1250 mg | ORAL_TABLET | Freq: Two times a day (BID) | ORAL | Status: DC

## 2012-09-22 NOTE — Discharge Summary (Signed)
Discharge Summary   Patient ID: [name] MRN: [id number], DOB/AGE: 1945/11/26 67 y.o. Admit date: 09/20/2012 D/C date:     09/22/2012  Primary Cardiologist: New to [facility] this admission - seen by Bensimhon initially, cath'd by [place]  Primary Discharge Diagnoses:  1. 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 Diabetes mellitus, uncontrolled A1C 9.1 3. HTN with history of orthostatic hypotension, on Florinef  4. Hyperlipidemia  5. CKD - reported stage 3  6. Iron deficiency anemia with reported recurrent GIB with likely AVM per report s/p cauterization 04/2012, receives IV iron transfusions periodically  - for outpatient GI f/u 7. Microscopic hematuria, followed by urology (see below for cysto report)  Secondary Discharge Diagnoses:  1. GERD 2. BPH 3. Diabetic peripheral neuropathy 4. Chronic lower back pain 5. Fatty liver  6. Colon polyp  Hospital Course: [name] is a 67 y/o M with no prior cardiac history but a history of longstanding uncontrolled DM with subsequent CKD stage III, HTN, HL, former tobacco abuse, recurrent GIB/iron deficiency anemia, and recent microscopic hematuria who presented as a transfer to [facility] from [facility] with chest pain. He has no formal cardiac history but a chest CT 06/2012 (done to f/u a previously enlarged lymph node that had resolved) showed coronary artery calcification. 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 The patient states he had a few spots cauterized and has had no problems with melena or BRBPR since. He receives periodic iron infusions through hematology clinic at [facility] and also receives intermittent Aranesp. He was found to have microscopic hematuria in 06/2012 and has been referred to Urology - per scanned notes, cystoscopy showed no bladder neoplasm (Anterior urethra: normal, Posterior urethra: lateral lobe  hypertrophy. Findings: visual obstruction., Bladder: normal. Findings: no evidence of lesion or neoplasm. Bladder impression from BPH. Ureteral orifices: normal). Renal US with bilateral benign-appearing cysts & slightly increased echogenic texture which could represent chronic medical renal disease. He was started on BPH treatment and states he was scheduled to follow up at the end of this month. He denied any frank hematuria.  On day of admission when he awoke at 5:30am, he noted central chest "soreness." It was not associated with any SOB, nausea, vomiting, diaphoresis, palpitations, or syncope. It did not radiate. He got up to go to the bathroom and felt no different so laid back in bed for several hours until 8am. The chest pain was subsiding but because it was still present, he told his wife who called his PCP. He was subsequently referred to the ED. By the time he arrived, he was pain free. In all, pain lasted about 2.5 hours. The pain was not made worse with inspiration, exertion, palpation or movement. He did not try anything at home for the pain. He received 4 baby ASA in the [facility]. Troponins were initially negative. CXR nonacute. Glucose 372 on admission - pt states his CBGs run anywhere from 125-400s at home. He admitted to dietary noncompliance. He does not exercise due to history of prior back surgery. EKG showed NSR with u wave in V4, otherwise no acute changes. Hgb was 11.8 on admission. Coreg was added for hypertension at low dose, keeping in mind history of orthostasis. Dr. Gala Romney discussed options of stress testing versus cath. Given magnitude of RFs + coronary calcifications on recent chest CT, it was decided to proceed with cath.  He was hydrated in prep for this. Troponins remained negative. Cath 09/21/12 demonstrated: 1. 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 (focal 50-70% mid LAD, small diag with diffuse 70% prox, ramus intermediate 40-50% in prox  vessel) 2. Mild CAD in RCA - 30% mid vessel. 3. Normal LV function. Medical management was recommended especially in light of history of GI bleeds. He was hydrated. Post-cath creatinine remained stable. Hgb did go from 11.8 to 9.8, but this was in the setting of IV fluids in the ER, pre-cath fluids and post-cath hydration as well. This was also not out of historic range for the patient. The patient denied any overt melena, BRBPR, hematemesis or hematuria but FOBT testing was positive. GI was consulted regarding recommendations. They have faxed GI records request to [facility]. They have scheduled an outpatient office visit with Dr. Christella Hartigan and do not feel that any blood transfusion or inpatient procedures are warranted at this time. They also requested to obtain cystoscopy records but these can be found under Media in the patient's chart scanned in from his urology visit, as cited in the H&P. Per GI, ok to start aspirin 81mg  daily. They recommended for him to keep his appointments with the heme clinic. The patient will be continued on his BID PPI and was instructed to monitor closely for any bleeding. He was also instructed to f/u closely with PCP for diabetes given A1C 9.1. Metformin will be held 48 hr post-cath (this will show up on AVS to the patient). Dr. Daleen Squibb has seen and examined the patient today and feels he is stable for discharge.  The patient was instructed not to take any NTG if he has taken Levitra within the last 24 hours, and vice versa. This was clearly outlined on his AVS Med Rec.  Discharge Vitals: Blood pressure 159/84, pulse 70, temperature 97.7 F (36.5 C), temperature source Oral, resp. rate 18, height 5\' 7"  (1.702 m), weight 162 lb 3.2 oz (73.573 kg), SpO2 100.00%.  Labs: Lab Results  Component Value Date   WBC 5.4 09/22/2012   HGB 9.8* 09/22/2012   HCT 29.0* 09/22/2012   MCV 83.8 09/22/2012   PLT 240 09/22/2012    Recent Labs Lab 09/20/12 1000  09/22/12 0810  NA 136  < > 140    K 4.0  < > 3.5  CL 102  < > 108  CO2 24  < > 25  BUN 14  < > 13  CREATININE 1.10  < > 1.12  CALCIUM 9.0  < > 8.5  PROT 6.4  --   --   BILITOT 0.3  --   --   ALKPHOS 115  --   --   ALT 16  --   --   AST 13  --   --   GLUCOSE 372*  < > 158*  < > = values in this interval not displayed.  Recent Labs  09/20/12 1306 09/20/12 1925 09/21/12 0045 09/21/12 0830  TROPONINI <0.30 <0.30 <0.30 <0.30   Lab Results  Component Value Date   CHOL 152 09/21/2012   HDL 27* 09/21/2012   LDLCALC 77 09/21/2012   TRIG 241* 09/21/2012   No results found for this basename: DDIMER    Diagnostic Studies/Procedures   Cardiac catheterization this admission, please see full report and above for summary.  Dg Chest Portable 1 View 09/20/2012   *RADIOLOGY REPORT*  Clinical Data: Mid chest tightness.  Hypertension.  PORTABLE CHEST -  1 VIEW  Comparison: 06/21/2012  Findings: Mild thoracic spondylosis observed.  Cardiac and mediastinal contours appear normal.  The lungs appear clear.  No pleural effusion is identified.  IMPRESSION:  1.  No acute findings to explain the patient's sensation of chest tightness. 2.  Mild thoracic spondylosis.   Original Report Authenticated By: Gaylyn Rong, M.D.    Discharge Medications     Medication List         aspirin 81 MG EC tablet  Take 1 tablet (81 mg total) by mouth daily.     atorvastatin 80 MG tablet  Commonly known as:  LIPITOR  Take 1 tablet (80 mg total) by mouth daily.     carvedilol 3.125 MG tablet  Commonly known as:  COREG  Take 1 tablet (3.125 mg total) by mouth 2 (two) times daily with a meal.     fludrocortisone 0.1 MG tablet  Commonly known as:  FLORINEF  Take 1 tablet (0.1 mg total) by mouth daily.     furosemide 20 MG tablet  Commonly known as:  LASIX  Take 20 mg by mouth daily as needed for fluid.     insulin glargine 100 UNIT/ML injection  Commonly known as:  LANTUS  Inject 0.45 mLs (45 Units total) into the skin at bedtime.      insulin lispro 100 UNIT/ML injection  Commonly known as:  HUMALOG  Inject 5-8 Units into the skin 3 (three) times daily before meals.     Insulin Syringe-Needle U-100 31G X 5/16" 0.5 ML Misc  Commonly known as:  BD INSULIN SYRINGE ULTRAFINE  Use 3x a day as advised     metFORMIN 500 MG tablet  Commonly known as:  GLUCOPHAGE  Take 1 tablet (500 mg total) by mouth 2 (two) times daily with a meal.  Start taking on:  09/23/2012     moxifloxacin 0.5 % ophthalmic solution  Commonly known as:  VIGAMOX  Place 1 drop into both eyes 3 (three) times daily.     NEXIUM 40 MG capsule  Generic drug:  esomeprazole  Take 40 mg by mouth 2 (two) times daily.     nitroGLYCERIN 0.4 MG SL tablet  Commonly known as:  NITROSTAT  Place 1 tablet (0.4 mg total) under the tongue every 5 (five) minutes as needed for chest pain (up to 3 doses).     tamsulosin 0.4 MG Caps  Commonly known as:  FLOMAX  Take 0.4 mg by mouth daily after supper.     vardenafil 10 MG tablet  Commonly known as:  LEVITRA  Take 1 tablet (10 mg total) by mouth daily as needed for erectile dysfunction.        Disposition   The patient will be discharged in stable condition to home. Discharge Orders   Future Appointments Provider Department Dept Phone   09/27/2012 9:45 AM Sharlotte Alamo [facility] [phone]   09/27/2012 10:15 AM Eunice Blase, PA-C [facility] [phone]   09/27/2012 10:45 AM [facility] Inj Nurse  [facility] [phone]   10/04/2012 8:15 AM Sandford Craze, NP [facility]  [facility] [phone]   10/05/2012 10:30 AM Minda Meo, PA-C [facility] [place]   10/22/2012 3:30 PM [name], MD [facility] [phone]   Future Orders Complete By Expires     Diet - low sodium heart healthy  As directed  Comments:      Diabetic Diet    Discharge  instructions  As directed     Comments:      Please contact your primary care doctor to discuss your diabetes treatment. Your A1c was 9.1 indicating poor control of your blood sugars.  If you notice any blood in your stool, black stools, vomiting blood, blood in your urine, or any other unusual bleeding, please call your doctor.    Increase activity slowly  As directed     Comments:      No driving for 2 days. No lifting over 5 lbs for 1 week. No sexual activity for 1 week. Keep procedure site clean & dry. If you notice increased pain, swelling, bleeding or pus, call/return!  You may shower, but no soaking baths/hot tubs/pools for 1 week.      Follow-up Information   Follow up with Rob Bunting, MD On 10/22/2012. (3:30 PM.  follow up with GI doctor. )    Contact information:   [address] [place] [address] [phone]       Follow up with Rick Duff, PA-C. (10/05/12 at 10:30am)    Contact information:   [address] [place] [address] [phone] [facility]       Follow up with Lemont Fillers., NP. (Follow up with PCP or endocrinologist for to discuss better blood sugar control.)    Contact information:   [address] [place] [address] [phone]         Duration of Discharge Encounter: Greater than 30 minutes including physician and PA time.  Signed, Dayna Dunn PA-C 09/22/2012, 2:47 PM  Jesse Sans. Daleen Squibb, MD, [facility] Pager:  [phone]

## 2012-09-22 NOTE — Consult Note (Signed)
[facility] Consult: 10:12 AM 09/22/2012   Referring Provider:  Dr Daleen Squibb Primary Care Physician:  Lemont Fillers., NP Primary Gastroenterologist:  Dr. Dorena Cookey in 2005, Dr Vernell Barrier at [facility] (now retired)  Reason for Consultation:  Anemia and heme + stool, recurrent  HPI: [name] is a 67 y.o. male.  Longstanding uncontrolled Type 2 IDDM, CKD stage III, HTN, recurrent GIB/iron deficiency anemia, and microscopic hematuria (cystoscopy for Microhematuria by Dr Lindley Magnus in [facility] about one montha ago).  No previous cardiac hx or disease.  Hepatic steatosis and stable right hepatic lesion, likely small hemangioma, noted on CT 11/2010. Hx of SB AVMs on EGD/enteroscopy  04/2012.  These were apparently cauterized. On BID Nexium.  FOB +06/11/12 and 09/21/12.  Says he has had 3 or for EGDs and colonoscopies and 2 or so capsule endoscopies over past few years. Had EGD/Colon in 01/2012 and reference made to hx of benign colon polyps and duodenal AVMs.  Per pt, Dr Vernell Barrier had entertained pt's referral for balloon enteroscopy in [place], but this had never been arranged.   Periodic infusions of parenteral iron and intermittent Aranesp (dating to at least Jan 2013) for chronic iron deficiency,  GI blood loss anemia under guidance of Dr Myna Hidalgo.  MD states that infusions of iron  "typically does help him out." FOB + often   Seen by surgeon at [facility] on 08/24/2011 for abdominal pulling and ? Of hernia.  The surgeon did not endorse dx of abdominal hernia Admitted 09/20/12 from [facility] ED with non-radiating chest pain which awakened him in the AM.  No n/v, sweats, dizziness. Not worsened by inspiration, activity.  Lasted 2.5 hours and gone by time of ED arrival.  Glucose 372 in the ED   Cardiac Cath 7/8 with "Moderate borderline obstructive CAD in the mid LAD and small diagonal branch.  Normal LV function." "Recommendations: Medical management  especially in light of his history of recurrent GI bleeds. Will hydrate. Repeat CBC in am."   His stool is FOB +.  Hgb is 9.8, down from 11.8 two days ago (precath).  Range of Hgb as low as 7.2 03/15/12, as high as 12.7 05/24/12.  MCV consistently WNL. PT/INR are normal.  Stool for last 2 days is soft, less formed and dark.  No gross blood.  Last recalled dark stools in 04/2012.  No nausea, no anorexia.  No abdominal pain, no NSAIDs.    Past Medical History  Diagnosis Date  . Hypertension   . High cholesterol   . GERD (gastroesophageal reflux disease)   . Bronchitis 03/06/2012    "not often" (09/20/2012)  . GI bleed     a. Recurrent GI bleed, tx with IV iron. b. Per heme notes - likely AVMs 04/2012 (tx with cauterization several months ago).  . Orthostatic hypotension     a. Tx with florinef.  . Hematuria     a. Urology note scan from 07/2012: cystoscopy without evidence for bladder lesion. b. Pt states he had "some tests" scheduled for later in July 2014.  . Stage III chronic kidney disease     a. Stage 3 (DM with complications ->CKD, peripheral neuropathy).  . Anemia, iron deficiency 03/24/2011    a. Recurrent GI bleed, tx with periodic iron infusions.  . 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 Anginal pain     "just today" (09/20/2012)  . Sleep apnea     "had mask; couldn't sleep in it" (09/20/2012)  . Type II  diabetes mellitus     a. Dx 1994, uncontrolled.   . Diabetic peripheral neuropathy   . Daily headache     "used to; stopped in the 1980's when I stopped taking aspirin" (09/20/2012)  . Arthritis     "lower back" (09/20/2012)  . Chronic lower back pain     Past Surgical History  Procedure Laterality Date  . Back surgery    . Shoulder open rotator cuff repair Left 1980's  . Inguinal hernia repair Right 1990's  . Lumbar disc surgery  1980's    Prior to Admission medications   Medication Sig Start Date End Date Taking? Authorizing Provider  atorvastatin (LIPITOR) 80 MG tablet Take 1  tablet (80 mg total) by mouth daily. 09/14/12  Yes Sandford Craze, NP  fludrocortisone (FLORINEF) 0.1 MG tablet Take 1 tablet (0.1 mg total) by mouth daily. 07/05/12  Yes Sandford Craze, NP  furosemide (LASIX) 20 MG tablet Take 20 mg by mouth daily as needed for fluid.   Yes Historical Provider, MD  insulin glargine (LANTUS) 100 UNIT/ML injection Inject 0.45 mLs (45 Units total) into the skin at bedtime. 07/19/12  Yes Carlus Pavlov, MD  insulin lispro (HUMALOG) 100 UNIT/ML injection Inject 5-8 Units into the skin 3 (three) times daily before meals.   Yes Historical Provider, MD  Insulin Syringe-Needle U-100 (BD INSULIN SYRINGE ULTRAFINE) 31G X 5/16" 0.5 ML MISC Use 3x a day as advised 08/23/12  Yes Carlus Pavlov, MD  metFORMIN (GLUCOPHAGE) 500 MG tablet Take 1 tablet (500 mg total) by mouth 2 (two) times daily with a meal. 06/21/12  Yes Sandford Craze, NP  moxifloxacin (VIGAMOX) 0.5 % ophthalmic solution Place 1 drop into both eyes 3 (three) times daily.   Yes Historical Provider, MD  NEXIUM 40 MG capsule Take 40 mg by mouth 2 (two) times daily.  05/03/11  Yes Historical Provider, MD  tamsulosin (FLOMAX) 0.4 MG CAPS Take 0.4 mg by mouth daily after supper.   Yes Historical Provider, MD  vardenafil (LEVITRA) 10 MG tablet Take 10 mg by mouth daily as needed for erectile dysfunction.   Yes Historical Provider, MD    Scheduled Meds: . aspirin EC  81 mg Oral Daily  . atorvastatin  80 mg Oral Daily  . carvedilol  3.125 mg Oral BID WC  . fludrocortisone  0.1 mg Oral Daily  . fluticasone  2 spray Each Nare Daily  . insulin aspart  0-15 Units Subcutaneous TID WC  . insulin glargine  45 Units Subcutaneous QHS  . pantoprazole  80 mg Oral BID  . sodium chloride  3 mL Intravenous Q12H  . tamsulosin  0.4 mg Oral QPC supper   Infusions:   PRN Meds: sodium chloride, acetaminophen, nitroGLYCERIN, ondansetron (ZOFRAN) IV, oxyCODONE-acetaminophen, sodium chloride   Allergies as of 09/20/2012  .  (No Known Allergies)    Family History  Problem Relation Age of Onset  . Heart attack Brother     Died at 65  . Stroke Father     Died at 73    History   Social History  . Marital Status: Married    Spouse Name: N/A    Number of Children: N/A  . Years of Education: N/A   Occupational History  . taken out of work due to back    Social History Main Topics  . Smoking status: Former Smoker -- 1.00 packs/day for 48 years    Types: Cigarettes    Quit date: 03/17/2012  . Smokeless tobacco: Never  Used  . Alcohol Use: Yes     Comment: 09/20/2012 "Used to; stopped ~ 2009; never had problem w/it"  . Drug Use: No  . Sexually Active: No   Other Topics Concern  . Trouble affording medications.   Social History Narrative  . No narrative on file    REVIEW OF SYSTEMS: Constitutional:  Fatigue ENT:  + hoarseness. Pulm:  No cough or dyspnea CV:  No palpitations, no recurrent chest pain GU:  Erectile dysfunction:  Prn Levitra.  Proteinuria.  GI:  Per HPI Heme:  Last dose of Iron was 08/16/12, last dose of Aranesp on same day.     Transfusions:  Pt denies transfusions in past Neuro:  Numbness and painful tingling in feet. Diminished hearing, tinnitus Derm:  No rash or sores, no itching Endocrine:  Subjective Hypothermia.  Immunization:  Not queried Travel:  Not queried   PHYSICAL EXAM: Vital signs in last 24 hours: Temp:  [97.5 F (36.4 C)-98.4 F (36.9 C)] 98.4 F (36.9 C) (07/09 0400) Pulse Rate:  [70-80] 77 (07/09 0400) Resp:  [18] 18 (07/09 0400) BP: (136-165)/(74-91) 152/81 mmHg (07/09 0400) SpO2:  [98 %-100 %] 100 % (07/09 0400)  General: pleasant, comfortable. Does not look ill Head:  No asymmetry, no swelling  Eyes:  No icterus or pallor Ears:  Slightly HOH  Nose:  No discharge Mouth:  Partial dentures x 2.   Good dental repair Neck:  No mass or JVD Lungs:  Clear bil.  No cough.  No dyspnea Heart: RRR.  No MRG Abdomen:  Soft, no mass, no hernia, no  tenderness.  Active BS.   Rectal: 3 plus FOB + on scant stool specimen .  No mass, no red blood  Musc/Skeltl: excuse for not excercising is previous back surgery.  Extremities:  No CCE  Neurologic:  No confusion.  Oriented x 3.  No tremor.  Moves all 4 limbs easily Skin:  No rash or sores.  No telangectasia Endocrine:  At home  Sugars run 150s-400s.  Non-compliant with diet. Tattoos:  None.  Nodes:  No cervical adenopathy   Psych:  Pleasant, cooperative.  Not depressed or anxious.   Intake/Output from previous day: 07/08 0701 - 07/09 0700 In: 393.1 [I.V.:393.1] Out: -  Intake/Output this shift: Total I/O In: 240 [P.O.:240] Out: -   LAB RESULTS:  Recent Labs  09/20/12 1000 09/21/12 0220 09/22/12 0430  WBC 5.0 4.6 5.4  HGB 11.8* 9.7* 9.8*  HCT 33.7* 28.3* 29.0*  PLT 244 226 240   BMET Lab Results  Component Value Date   NA 140 09/22/2012   NA 135 09/21/2012   NA 136 09/20/2012   K 3.5 09/22/2012   K 4.0 09/21/2012   K 4.0 09/20/2012   CL 108 09/22/2012   CL 103 09/21/2012   CL 102 09/20/2012   CO2 25 09/22/2012   CO2 27 09/21/2012   CO2 24 09/20/2012   GLUCOSE 158* 09/22/2012   GLUCOSE 346* 09/21/2012   GLUCOSE 372* 09/20/2012   BUN 13 09/22/2012   BUN 16 09/21/2012   BUN 14 09/20/2012   CREATININE 1.12 09/22/2012   CREATININE 1.06 09/21/2012   CREATININE 1.10 09/20/2012   CALCIUM 8.5 09/22/2012   CALCIUM 8.1* 09/21/2012   CALCIUM 9.0 09/20/2012   LFT  Recent Labs  09/20/12 1000  PROT 6.4  ALBUMIN 2.6*  AST 13  ALT 16  ALKPHOS 115  BILITOT 0.3   PT/INR Lab Results  Component Value Date   INR  0.94 09/20/2012    RADIOLOGY STUDIES: Dg Chest Portable 1 View  09/20/2012   *RADIOLOGY REPORT*  Clinical Data: Mid chest tightness.  Hypertension.  PORTABLE CHEST - 1 VIEW  Comparison: 06/21/2012  Findings: Mild thoracic spondylosis observed.  Cardiac and mediastinal contours appear normal.  The lungs appear clear.  No pleural effusion is identified.  IMPRESSION:  1.  No acute findings to explain the  patient's sensation of chest tightness. 2.  Mild thoracic spondylosis.   Original Report Authenticated By: Gaylyn Rong, M.D.    ENDOSCOPIC STUDIES: Capsule endoscopy November vs December 2013 By 04/2012 he had had an enteroscopy with ablation of SB AVMs.   Colonoscopy and EGD in [facility] Per Dr Gustavo Lah note of 05/2011 "He said he had an upper endoscopy and colonoscopy. Both did not show  any evidence of bleeding. He then underwent a capsule endoscopy. He  does not have the results back from this".  10/2003 Colonoscopy  Dr Dorena Cookey  Screening study/+family hx IMPRESSION: Normal colonoscopy.  PLAN: Repeat study in 5 years.  10/2003  EGD for chronic nausea and heme positive stool IMPRESSION: Mild antral gastritis, otherwise normal study.  PLAN: Await CLO test. Treat for eradication of Helicobacter if positive.  If negative, consider a course of Reglan for the possibility of viral  diabetic gastroparesis.    IMPRESSION: *  Chronic GI blood loss, long standing.  From the limited but reliable records (3rd person reports) this appears to be related to bleeding from SB AVMs.  He was cauterized sometime between 02/2012 and 04/1012 at [facility]. Currently with dark, FOB + stools.  There is also mention of hematuria and "recent" benign cystoscopy" *  Chronic recurrent iron deficiency anemia, due to blood loss and possibly from stage 3 CKD.  Regular treatment with Aranesp and Infed per Dr Myna Hidalgo.  Last doses: 08/16/12.  Next appt is 09/27/12  Hgb has declined but not out of historic range and not in need of transfusion.  Pt's often drop Hgb a gram or so after cardiac cath.  *  DM since 1994.  Consistently out of control.  *  Chest pain. Cardiac cath with borderline CAD on 09/21/12:  Medical mgt   PLAN: *  Faxed GI records request to [facility] at [phone] *  Has 8/8 GI ROV with Dr Christella Hartigan.  *  Keep 09/2012 appt with hematology in [facility]. *  Might want to obtain cystoscopy  records as would be interestig to ascertain the source of his microhematuria.   *  Per Dr Christella Hartigan:  Ok to start 81 mg ASA.      LOS: 2 days   Jennye Moccasin  09/22/2012, 10:12 AM Pager: [phone]     ________________________________________________________________________  [facility] MD note:  I personally examined the patient, reviewed the data and agree with the assessment and plan described above.  He will follow up in my office in 3-4 weeks.     Rob Bunting, MD [facility] Pager [phone]

## 2012-09-22 NOTE — Progress Notes (Signed)
Patient: [name] / Admit Date: 09/20/2012 / Date of Encounter: 09/22/2012, 7:26 AM   Subjective  No CP or SOB. Feels well. FOBT+ but has not noticed any bleeding.   Objective   Telemetry: NSR Physical Exam: Filed Vitals:   09/22/12 0400  BP: 152/81  Pulse: 77  Temp: 98.4 F (36.9 C)  Resp: 18   General: Well developed, well nourished [name] in no acute distress. Head: Normocephalic, atraumatic, sclera non-icteric, no xanthomas, nares are without discharge. Neck: Negative for carotid bruits. JVD not elevated. Lungs: Clear bilaterally to auscultation without wheezes, rales, or rhonchi. Breathing is unlabored. Heart: RRR S1 S2 without murmurs, rubs, or gallops.  Abdomen: Soft, non-tender, non-distended with normoactive bowel sounds. No hepatomegaly. No rebound/guarding. No obvious abdominal masses. Msk:  Strength and tone appear normal for age. Extremities: No clubbing or cyanosis. No edema.  Distal pedal pulses are 2+ and equal bilaterally. R radial site without complication, hematoma, or oozing through bandage. Neuro: Alert and oriented X 3. Moves all extremities spontaneously. Psych:  Responds to questions appropriately with a normal affect.    Intake/Output Summary (Last 24 hours) at 09/22/12 0726 Last data filed at 09/21/12 1000  Gross per 24 hour  Intake 393.07 ml  Output      0 ml  Net 393.07 ml    Inpatient Medications:  . aspirin EC  81 mg Oral Daily  . atorvastatin  80 mg Oral Daily  . carvedilol  3.125 mg Oral BID WC  . fludrocortisone  0.1 mg Oral Daily  . fluticasone  2 spray Each Nare Daily  . insulin aspart  0-15 Units Subcutaneous TID WC  . insulin glargine  45 Units Subcutaneous QHS  . pantoprazole  80 mg Oral BID  . pneumococcal 23 valent vaccine  0.5 mL Intramuscular Tomorrow-1000  . sodium chloride  3 mL Intravenous Q12H  . tamsulosin  0.4 mg Oral QPC supper    Labs:  Recent Labs  09/20/12 1000 09/21/12 0220  NA 136 135  K 4.0 4.0  CL 102  103  CO2 24 27  GLUCOSE 372* 346*  BUN 14 16  CREATININE 1.10 1.06  CALCIUM 9.0 8.1*    Recent Labs  09/20/12 1000  AST 13  ALT 16  ALKPHOS 115  BILITOT 0.3  PROT 6.4  ALBUMIN 2.6*    Recent Labs  09/20/12 1000 09/21/12 0220 09/22/12 0430  WBC 5.0 4.6 5.4  NEUTROABS 3.4  --   --   HGB 11.8* 9.7* 9.8*  HCT 33.7* 28.3* 29.0*  MCV 84.9 83.5 83.8  PLT 244 226 240    Recent Labs  09/20/12 1306 09/20/12 1925 09/21/12 0045 09/21/12 0830  TROPONINI <0.30 <0.30 <0.30 <0.30   No components found with this basename: POCBNP,   Recent Labs  09/20/12 1925  HGBA1C 9.1*     Radiology/Studies:  Dg Chest Portable 1 View 09/20/2012   *RADIOLOGY REPORT*  Clinical Data: Mid chest tightness.  Hypertension.  PORTABLE CHEST - 1 VIEW  Comparison: 06/21/2012  Findings: Mild thoracic spondylosis observed.  Cardiac and mediastinal contours appear normal.  The lungs appear clear.  No pleural effusion is identified.  IMPRESSION:  1.  No acute findings to explain the patient's sensation of chest tightness. 2.  Mild thoracic spondylosis.   Original Report Authenticated By: Gaylyn Rong, M.D.     Assessment and Plan  1. Chest pain with moderate borderline CAD in mid LAD/small diagonal branch, to be managed medically 2. Diabetes mellitus,  uncontrolled A1C 9.1, followed closely as outpatient  3. HTN with history of orthostatic hypotension, on Florinef 4. Hyperlipidemia  5. CKD - reported stage 3  6. 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 Microscopic hematuria, followed by urology (cystoscopy without neoplasm, renal US with benign appearing cysts)  Medical management recommended for CAD in light of history of recurrent GIB. Hgb 9.8 down from 11.8 on admission, question related to IV fluids (got bolus in ER then pre-cath/post-cath fluids). No overt bleeding reported but FOBT+. (Pt  has h/o melena/BRBPR that resolve per his report after cautery of AVMs in 04/2012.) ?GI consult while inpatient - pt states his own GI physician in [facility] retired months ago and he prefers to get established with another group while in the hospital. MD to advise regarding ASA.  Continue Coreg. Has h/o orthostatic hypotension on florinef so may need some permissive supine HTN. Continue statin. F/u PCP closely for diabetes management. F/u BMET this AM post-cath.  Signed, Ronie Spies PA-C Patient examined and agree except changes made. We'll obtain [facility] and set up followup as an outpatient. Hope discharge hopefully later today if things are stable.  Valera Castle, MD 09/22/2012 8:37 AM

## 2012-09-22 NOTE — Progress Notes (Signed)
D/c orders received;IV removed with gauze on, pt remains in stable condition, pt meds and instructions reviewed and given to pt; pt d/c to home 

## 2012-09-27 ENCOUNTER — Telehealth: Payer: Self-pay | Admitting: Hematology & Oncology

## 2012-09-27 ENCOUNTER — Ambulatory Visit (HOSPITAL_BASED_OUTPATIENT_CLINIC_OR_DEPARTMENT_OTHER): Payer: Medicare Other

## 2012-09-27 ENCOUNTER — Ambulatory Visit (HOSPITAL_BASED_OUTPATIENT_CLINIC_OR_DEPARTMENT_OTHER): Payer: Medicare Other | Admitting: Medical

## 2012-09-27 ENCOUNTER — Other Ambulatory Visit (HOSPITAL_BASED_OUTPATIENT_CLINIC_OR_DEPARTMENT_OTHER): Payer: Medicare Other | Admitting: Lab

## 2012-09-27 ENCOUNTER — Other Ambulatory Visit: Payer: Self-pay | Admitting: Medical

## 2012-09-27 VITALS — BP 129/70 | HR 101 | Temp 97.7°F | Resp 18 | Ht 67.0 in | Wt 165.0 lb

## 2012-09-27 DIAGNOSIS — D649 Anemia, unspecified: Secondary | ICD-10-CM

## 2012-09-27 DIAGNOSIS — N289 Disorder of kidney and ureter, unspecified: Secondary | ICD-10-CM

## 2012-09-27 DIAGNOSIS — D631 Anemia in chronic kidney disease: Secondary | ICD-10-CM

## 2012-09-27 DIAGNOSIS — D509 Iron deficiency anemia, unspecified: Secondary | ICD-10-CM

## 2012-09-27 DIAGNOSIS — D500 Iron deficiency anemia secondary to blood loss (chronic): Secondary | ICD-10-CM

## 2012-09-27 DIAGNOSIS — K922 Gastrointestinal hemorrhage, unspecified: Secondary | ICD-10-CM

## 2012-09-27 DIAGNOSIS — E1165 Type 2 diabetes mellitus with hyperglycemia: Secondary | ICD-10-CM

## 2012-09-27 LAB — CBC WITH DIFFERENTIAL (CANCER CENTER ONLY)
BASO#: 0 10*3/uL (ref 0.0–0.2)
BASO%: 0.6 % (ref 0.0–2.0)
EOS%: 2.8 % (ref 0.0–7.0)
Eosinophils Absolute: 0.1 10*3/uL (ref 0.0–0.5)
HCT: 28.1 % — ABNORMAL LOW (ref 38.7–49.9)
HGB: 9.7 g/dL — ABNORMAL LOW (ref 13.0–17.1)
LYMPH#: 0.9 10*3/uL (ref 0.9–3.3)
LYMPH%: 17.7 % (ref 14.0–48.0)
MCH: 30.2 pg (ref 28.0–33.4)
MCHC: 34.5 g/dL (ref 32.0–35.9)
MCV: 88 fL (ref 82–98)
MONO#: 0.5 10*3/uL (ref 0.1–0.9)
MONO%: 10.7 % (ref 0.0–13.0)
NEUT#: 3.4 10*3/uL (ref 1.5–6.5)
NEUT%: 68.2 % (ref 40.0–80.0)
Platelets: 297 10*3/uL (ref 145–400)
RBC: 3.21 10*6/uL — ABNORMAL LOW (ref 4.20–5.70)
RDW: 16.2 % — ABNORMAL HIGH (ref 11.1–15.7)
WBC: 5 10*3/uL (ref 4.0–10.0)

## 2012-09-27 LAB — BASIC METABOLIC PANEL - CANCER CENTER ONLY
BUN, Bld: 11 mg/dL (ref 7–22)
CO2: 31 mEq/L (ref 18–33)
Chloride: 102 mEq/L (ref 98–108)
Creat: 1.1 mg/dl (ref 0.6–1.2)
Glucose, Bld: 235 mg/dL — ABNORMAL HIGH (ref 73–118)

## 2012-09-27 LAB — RETICULOCYTES (CHCC): ABS Retic: 141.5 10*3/uL (ref 19.0–186.0)

## 2012-09-27 MED ORDER — DARBEPOETIN ALFA-POLYSORBATE 300 MCG/0.6ML IJ SOLN
300.0000 ug | Freq: Once | INTRAMUSCULAR | Status: AC
  Administered 2012-09-27: 300 ug via SUBCUTANEOUS

## 2012-09-27 NOTE — Progress Notes (Signed)
DIAGNOSES: 1. 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 2. Renal insufficiency. 3. Poorly controlled diabetes.  CURRENT THERAPY: 1. IV iron as indicated.  Last dose of IV iron was 08/16/2012 2. Aranesp 300 mcg subcu as needed for hemoglobin less than 10.  INTERIM HISTORY: [name] presents today for an office followup visit.  He states that he was just recently in the hospital.  He was having some chest pain that did not subside.  He did end up having a heart catheterization on July 8.  This demonstrated moderate borderline obstructive coronary artery disease in the mid LAD and small diagonal branch.  He does have normal left ventricular function.  Medical management was recommended, especially in light of history of GI bleeds.  I do believe his stool was tested for microscopic blood which came back positive.  He was referred to Dr. Christella Hartigan on 10/22/12.  We do have to give him IV iron intermittently.  He does have a history of gastrointestinal blood loss secondary to AVMs.  His last dose of IV iron was back on 08/16/2012.  At that time, his ferritin was 14, iron 29 with 11% saturation.  Of note, he was found to have microscopic hematuria while in the hospital.  He was referred to urology.  Apparently, cystoscopy showed no bladder neoplasm.  He denies any frank hematuria.  He still continues to have poorly controlled diabetes.  He does followup with his primary care physician.  He does report that every now and then his stools are dark.  He's not reporting any bright red blood per rectum. He reports a decent appetite.  He denies any unintentional weight loss.  He denies any nausea, vomiting, diarrhea or constipation.  He denies any fevers, chills or night sweats.  He denies any current chest pain, shortness of breath or cough.  He denies any lower leg swelling.  He denies any abdominal pain.  He denies any headaches, visual changes or rashes.  Of note, his hemoglobin is  below 10 today, he will receive an Aranesp injection.    Review of Systems: Constitutional:Negative for malaise/fatigue, fever, chills, weight loss, diaphoresis, activity change, appetite change, and unexpected weight change.  HEENT: Negative for double vision, blurred vision, visual loss,, tinnitus, congestion, rhinorrhea, epistaxis sore throat or sinus disease, oral pain/lesion, tongue soreness. Positive for ear pain. Respiratory: Negative for cough, chest tightness, shortness of breath, wheezing and stridor.  Cardiovascular: Negative for chest pain, palpitations, leg swelling, orthopnea, PND, DOE or claudication Gastrointestinal: Negative for nausea, vomiting, abdominal pain, diarrhea, constipation, blood in stool, melena, hematochezia, abdominal distention, anal bleeding, rectal pain, anorexia and hematemesis.  Genitourinary: Negative for dysuria, frequency, hematuria,  Musculoskeletal: Negative for myalgias, back pain, joint swelling, arthralgias and gait problem.  Skin: Negative for rash, color change, pallor and wound.  Neurological:. Negative for dizziness/light-headedness, tremors, seizures, syncope, facial asymmetry, speech difficulty, weakness, numbness, headaches and paresthesias.  Hematological: Negative for adenopathy. Does not bruise/bleed easily.  Psychiatric/Behavioral:  Negative for depression, no loss of interest in normal activity or change in sleep pattern.   Physical Exam: This is a 67 year old, well-developed, well-nourished, black gentleman, in no obvious distress Vitals: Temperature 97.7 degrees pulse 101 respirations 18 blood pressure 129/70 weight 165 pounds  HEENT reveals a normocephalic, atraumatic skull, no scleral icterus, no oral lesions.   Neck is supple without any cervical or supraclavicular adenopathy.  Lungs are clear to auscultation bilaterally. There are no wheezes, rales or rhonci Cardiac  is regular rate and rhythm with a normal S1 and S2. There are no  murmurs, rubs, or bruits.  Abdomen is soft with good bowel sounds, there is no palpable mass. There is no palpable hepatosplenomegaly. There is no palpable fluid wave.  Musculoskeletal no tenderness of the spine, ribs, or hips.  Extremities there are no clubbing, cyanosis, or edema.  Skin no petechia, purpura or ecchymosis Neurologic is nonfocal.  Laboratory Data: White count 5.0 hemoglobin 9.7 hematocrit 28.1 MCV 88 platelets 297,000  Current Outpatient Prescriptions on File Prior to Visit  Medication Sig Dispense Refill  . atorvastatin (LIPITOR) 80 MG tablet TAKE 1 TABLET BY MOUTH AT BEDTIME  30 tablet  2  . fluticasone (FLONASE) 50 MCG/ACT nasal spray Place 2 sprays into the nose daily.  16 g  6  . furosemide (LASIX) 20 MG tablet One by mouth in the morning as needed for swelling      . insulin glargine (LANTUS SOLOSTAR) 100 UNIT/ML injection Inject 50 Units into the skin at bedtime.  5 pen  6  . insulin lispro (HUMALOG) 100 UNIT/ML injection Inject 5 Units into the skin 3 (three) times daily before meals.  10 mL  12  . Insulin Syringe-Needle U-100 (INSULIN SYRINGE 1CC/31GX5/16") 31G X 5/16" 1 ML MISC Use with insulin injection three times daily.  100 each  3  . metFORMIN (GLUCOPHAGE) 500 MG tablet TAKE 1 TAB(S) 2 TIMES A DAY ORALLY  60 tablet  3  . NEXIUM 40 MG capsule Take 40 mg by mouth 2 (two) times daily.       . NONFORMULARY OR COMPOUNDED ITEM meloxicam 0.9%, topriamate 3%, tizanidine 0.2%, lidocaine 3% apply to feet 3-4x daily prn      . vardenafil (LEVITRA) 10 MG tablet Take 1 tablet (10 mg total) by mouth daily as needed for erectile dysfunction.  6 tablet  2  . VIGAMOX 0.5 % ophthalmic solution Place 1 drop into both eyes 3 (three) times daily.        No current facility-administered medications on file prior to visit.   Assessment/Plan: This is a pleasant, 67 year old, white gentleman, with the following issues:  #1.  Recurrent gastrointestinal blood loss/intermittent iron  deficiency.  He does require iron every couple of months.  Again, the last, time, he received, IV iron was back in June.  We are checking another iron panel on him today.  He does have an appointment with Dr. Christella Hartigan in early August.  #2.  Renal insufficiency.  He does receive Aranesp injection for hemoglobin below 10.  He will receive Aranesp today.  #3.  Followup.  Mr. Golebiewski will follow back up with [place] in one months, but before then should there be questions or concerns.

## 2012-09-27 NOTE — Telephone Encounter (Signed)
Pt aware of 7-16 iron appointment

## 2012-09-29 ENCOUNTER — Ambulatory Visit (HOSPITAL_BASED_OUTPATIENT_CLINIC_OR_DEPARTMENT_OTHER): Payer: Medicare Other

## 2012-09-29 VITALS — BP 149/76 | HR 103 | Temp 97.8°F | Resp 20

## 2012-09-29 DIAGNOSIS — D509 Iron deficiency anemia, unspecified: Secondary | ICD-10-CM

## 2012-09-29 DIAGNOSIS — D631 Anemia in chronic kidney disease: Secondary | ICD-10-CM

## 2012-09-29 MED ORDER — SODIUM CHLORIDE 0.9 % IV SOLN
Freq: Once | INTRAVENOUS | Status: AC
  Administered 2012-09-29: 09:00:00 via INTRAVENOUS

## 2012-09-29 MED ORDER — SODIUM CHLORIDE 0.9 % IV SOLN
1020.0000 mg | Freq: Once | INTRAVENOUS | Status: AC
  Administered 2012-09-29: 1020 mg via INTRAVENOUS
  Filled 2012-09-29: qty 34

## 2012-09-29 NOTE — Patient Instructions (Addendum)
Ferumoxytol injection What is this medicine? FERUMOXYTOL is an iron complex. Iron is used to make healthy red blood cells, which carry oxygen and nutrients throughout the body. This medicine is used to treat iron deficiency anemia in people with chronic kidney disease. This medicine may be used for other purposes; ask your health care provider or pharmacist if you have questions. What should I tell my health care provider before I take this medicine? They need to know if you have any of these conditions: -anemia not caused by low iron levels -high levels of iron in the blood -magnetic resonance imaging (MRI) test scheduled -an unusual or allergic reaction to iron, other medicines, foods, dyes, or preservatives -pregnant or trying to get pregnant -breast-feeding How should I use this medicine? This medicine is for infusion into a vein. It is given by a health care professional in a hospital or clinic setting. Talk to your pediatrician regarding the use of this medicine in children. Special care may be needed. Overdosage: If you think you've taken too much of this medicine contact a poison control center or emergency room at once. Overdosage: If you think you have taken too much of this medicine contact a poison control center or emergency room at once. NOTE: This medicine is only for you. Do not share this medicine with others. What if I miss a dose? It is important not to miss your dose. Call your doctor or health care professional if you are unable to keep an appointment. What may interact with this medicine? This medicine may interact with the following medications: -other iron products This list may not describe all possible interactions. Give your health care provider a list of all the medicines, herbs, non-prescription drugs, or dietary supplements you use. Also tell them if you smoke, drink alcohol, or use illegal drugs. Some items may interact with your medicine. What should I watch  for while using this medicine? Visit your doctor or healthcare professional regularly. Tell your doctor or healthcare professional if your symptoms do not start to get better or if they get worse. You may need blood work done while you are taking this medicine. You may need to follow a special diet. Talk to your doctor. Foods that contain iron include: whole grains/cereals, dried fruits, beans, or peas, leafy green vegetables, and organ meats (liver, kidney). What side effects may I notice from receiving this medicine? Side effects that you should report to your doctor or health care professional as soon as possible: -allergic reactions like skin rash, itching or hives, swelling of the face, lips, or tongue -breathing problems -changes in blood pressure -feeling faint or lightheaded, falls -fever or chills -flushing, sweating, or hot feelings -swelling of the ankles or feet Side effects that usually do not require medical attention (Report these to your doctor or health care professional if they continue or are bothersome.): -diarrhea -headache -nausea, vomiting -stomach pain This list may not describe all possible side effects. Call your doctor for medical advice about side effects. You may report side effects to FDA at 1-800-FDA-1088. Where should I keep my medicine? This drug is given in a hospital or clinic and will not be stored at home. NOTE: This sheet is a summary. It may not cover all possible information. If you have questions about this medicine, talk to your doctor, pharmacist, or health care provider.  2012, [employer]vier/Gold Standard. (11/24/2007 9:48:25 PM) 

## 2012-09-29 NOTE — Progress Notes (Signed)
09/29/2012 Patient received iron today.

## 2012-10-04 ENCOUNTER — Ambulatory Visit (INDEPENDENT_AMBULATORY_CARE_PROVIDER_SITE_OTHER): Payer: Medicare Other | Admitting: Family

## 2012-10-04 ENCOUNTER — Encounter: Payer: Self-pay | Admitting: Family

## 2012-10-04 VITALS — BP 150/80 | HR 91 | Temp 98.1°F | Resp 16 | Ht 67.0 in | Wt 163.1 lb

## 2012-10-04 DIAGNOSIS — E1165 Type 2 diabetes mellitus with hyperglycemia: Secondary | ICD-10-CM

## 2012-10-04 DIAGNOSIS — R319 Hematuria, unspecified: Secondary | ICD-10-CM

## 2012-10-04 DIAGNOSIS — I2510 Atherosclerotic heart disease of native coronary artery without angina pectoris: Secondary | ICD-10-CM | POA: Insufficient documentation

## 2012-10-04 DIAGNOSIS — E1149 Type 2 diabetes mellitus with other diabetic neurological complication: Secondary | ICD-10-CM

## 2012-10-04 DIAGNOSIS — I10 Essential (primary) hypertension: Secondary | ICD-10-CM

## 2012-10-04 DIAGNOSIS — E1142 Type 2 diabetes mellitus with diabetic polyneuropathy: Secondary | ICD-10-CM

## 2012-10-04 DIAGNOSIS — E1140 Type 2 diabetes mellitus with diabetic neuropathy, unspecified: Secondary | ICD-10-CM | POA: Insufficient documentation

## 2012-10-04 MED ORDER — GABAPENTIN 300 MG PO CAPS: 300.0000 mg | ORAL_CAPSULE | Freq: Three times a day (TID) | ORAL | Status: DC

## 2012-10-04 NOTE — Progress Notes (Signed)
Subjective:    Patient ID: [name], male    DOB: 1946-01-23, 67 y.o.   MRN: [id number]  HPI  [name] is a 67 yr old male who present today for follow up.  1) CAD- the pt was recently evaluated for chest pain.  He underwent cardiac cath which revealed moderate borderline CAD in the mid LAD/small diagonal branch.  He will be managed medically.   2) Microscopic hematuria-  He is being followed by Dr. Lindley Magnus. Pt is scheduled for biopsy 8/1.   3)  DM2- reports that his home blood sugars have been running around 200.  He is using lantus 45, novolog 5-8 units TID and metformin.  Reports numbness in feet.  Foot pain sometimes wakes him up from sleep at night.   4) HTN-  He is currently maintained on coreg, furosemide, losartan.    Review of Systems    see HPI  Past Medical History  Diagnosis Date  . Hypertension   . High cholesterol   . GERD (gastroesophageal reflux disease)   . GI bleed     a. Recurrent GI bleed, tx with IV iron. b. Per heme notes - likely AVMs 04/2012 (tx with cauterization several months ago).  . Orthostatic hypotension     a. Tx with florinef.  . Hematuria     a. Urology note scan from 07/2012: cystoscopy without evidence for bladder lesion, only lateral hypertrophy of posterior urethra, bladder impression from BPH. b. Pt states he had "some tests" scheduled for later in July 2014.  . Stage III chronic kidney disease     a. Stage 3 (DM with complications ->CKD, peripheral neuropathy).  . Anemia, iron deficiency 03/24/2011    a. Recurrent GI bleed, tx with periodic iron infusions.  . 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 Sleep apnea     "had mask; couldn't sleep in it" (09/20/2012)  . Type II diabetes mellitus     a. Dx 1994, uncontrolled.   . Diabetic peripheral neuropathy 2014    foot pain.  . Chronic lower back pain   . Fatty liver     on CT of 11/2010  . AVM (arteriovenous malformation) of duodenum, acquired     egds in 01/2012, 04/2011  . Polyp, colonic      Colonoscopy 01/2012 "benign" polyp  . CAD (coronary artery disease)     a. Cath 09/2012: moderate borderline CAD in mid LAD/small diagonal branch, mild RCA stenosis, to be managed medically   . BPH (benign prostatic hyperplasia)     History   Social History  . Marital Status: Married    Spouse Name: N/A    Number of Children: N/A  . Years of Education: N/A   Occupational History  . taken out of work due to back    Social History Main Topics  . Smoking status: Former Smoker -- 1.00 packs/day for 48 years    Types: Cigarettes    Quit date: 03/17/2012  . Smokeless tobacco: Never Used  . Alcohol Use: Yes     Comment: 09/20/2012 "Used to; stopped ~ 2009; never had problem w/it"  . Drug Use: No  . Sexually Active: No   Other Topics Concern  . Not on file   Social History Narrative  . No narrative on file    Past Surgical History  Procedure Laterality Date  . Shoulder open rotator cuff repair Left 1980's  . Inguinal hernia repair Right 2011  . Lumbar disc surgery  1980's  Family History  Problem Relation Age of Onset  . Heart attack Brother     Died at 81  . Stroke Father     Died at 27    No Known Allergies  Current Outpatient Prescriptions on File Prior to Visit  Medication Sig Dispense Refill  . aspirin EC 81 MG EC tablet Take 1 tablet (81 mg total) by mouth daily.      [name] Kitchen atorvastatin (LIPITOR) 80 MG tablet Take 1 tablet (80 mg total) by mouth daily.  30 tablet  2  . carvedilol (COREG) 3.125 MG tablet Take 1 tablet (3.125 mg total) by mouth 2 (two) times daily with a meal.  60 tablet  2  . fludrocortisone (FLORINEF) 0.1 MG tablet Take 1 tablet (0.1 mg total) by mouth daily.  30 tablet  2  . furosemide (LASIX) 20 MG tablet Take 20 mg by mouth daily as needed for fluid.      [name] Kitchen insulin glargine (LANTUS) 100 UNIT/ML injection Inject 0.45 mLs (45 Units total) into the skin at bedtime.  5 pen  6  . insulin lispro (HUMALOG) 100 UNIT/ML injection Inject 5-8 Units  into the skin 3 (three) times daily before meals.      . Insulin Syringe-Needle U-100 (BD INSULIN SYRINGE ULTRAFINE) 31G X 5/16" 0.5 ML MISC Use 3x a day as advised  100 each  1  . losartan (COZAAR) 50 MG tablet Take 50 mg by mouth daily.      . metFORMIN (GLUCOPHAGE) 500 MG tablet Take 1 tablet (500 mg total) by mouth 2 (two) times daily with a meal.      . moxifloxacin (VIGAMOX) 0.5 % ophthalmic solution Place 1 drop into both eyes 3 (three) times daily.      [name] Kitchen NEXIUM 40 MG capsule Take 40 mg by mouth 2 (two) times daily.       . nitroGLYCERIN (NITROSTAT) 0.4 MG SL tablet Place 1 tablet (0.4 mg total) under the tongue every 5 (five) minutes as needed for chest pain (up to 3 doses).  25 tablet  2  . tamsulosin (FLOMAX) 0.4 MG CAPS Take 0.4 mg by mouth daily after supper.      . vardenafil (LEVITRA) 10 MG tablet Take 1 tablet (10 mg total) by mouth daily as needed for erectile dysfunction.       No current facility-administered medications on file prior to visit.    BP 150/80  Pulse 91  Temp(Src) 98.1 F (36.7 C) (Oral)  Resp 16  Ht 5\' 7"  (1.702 m)  Wt 163 lb 1.3 oz (73.973 kg)  BMI 25.54 kg/m2  SpO2 99%    Objective:   Physical Exam  Constitutional: He is oriented to person, place, and time. He appears well-developed and well-nourished. No distress.  Cardiovascular: Normal rate and regular rhythm.   No murmur heard. Pulmonary/Chest: Effort normal and breath sounds normal. No respiratory distress. He has no wheezes. He has no rales. He exhibits no tenderness.  Musculoskeletal: He exhibits no edema.  Neurological: He is alert and oriented to person, place, and time.  Psychiatric: He has a normal mood and affect. His behavior is normal. Judgment and thought content normal.          Assessment & Plan:

## 2012-10-04 NOTE — Assessment & Plan Note (Signed)
Trial of HS gabapentin.  

## 2012-10-04 NOTE — Assessment & Plan Note (Signed)
BP Readings from Last 3 Encounters:  10/04/12 150/80  09/29/12 149/76  09/27/12 129/70    Fair BP control, continue current meds.

## 2012-10-04 NOTE — Patient Instructions (Addendum)
Please call Dr. Charlean Sanfilippo office to arrange a follow up appointment.   Please schedule a follow up appointment in 3 months.

## 2012-10-04 NOTE — Assessment & Plan Note (Addendum)
Clinically stable. Medical management per cardiology.  °

## 2012-10-04 NOTE — Assessment & Plan Note (Signed)
For bladder biopsy with Dr. Lindley Magnus.

## 2012-10-04 NOTE — Assessment & Plan Note (Signed)
A1C up at 9.1.  Defer management to endo.

## 2012-10-05 ENCOUNTER — Encounter: Payer: Medicare Other | Admitting: Cardiology

## 2012-10-05 NOTE — Progress Notes (Signed)
Please disregard. Note created in error. Patient was a no-show. This encounter was created in error - please disregard.

## 2012-10-06 ENCOUNTER — Telehealth: Payer: Self-pay | Admitting: *Deleted

## 2012-10-06 NOTE — Telephone Encounter (Signed)
Received fax from [facility] request order for pt to receive therapeutic diabetic shoes and custom inserts. Pt wishes to proceed. Form completed and faxed to [phone].

## 2012-10-22 ENCOUNTER — Encounter: Payer: Self-pay | Admitting: Cardiology

## 2012-10-22 ENCOUNTER — Encounter: Payer: Self-pay | Admitting: Gastroenterology

## 2012-10-22 ENCOUNTER — Ambulatory Visit (INDEPENDENT_AMBULATORY_CARE_PROVIDER_SITE_OTHER): Payer: Medicare Other | Admitting: Cardiology

## 2012-10-22 ENCOUNTER — Ambulatory Visit (INDEPENDENT_AMBULATORY_CARE_PROVIDER_SITE_OTHER): Payer: Medicare Other | Admitting: Gastroenterology

## 2012-10-22 ENCOUNTER — Other Ambulatory Visit (INDEPENDENT_AMBULATORY_CARE_PROVIDER_SITE_OTHER): Payer: Medicare Other

## 2012-10-22 VITALS — BP 128/68 | HR 88 | Ht 66.75 in | Wt 166.5 lb

## 2012-10-22 VITALS — BP 136/84 | HR 87 | Ht 67.0 in | Wt 166.4 lb

## 2012-10-22 DIAGNOSIS — R079 Chest pain, unspecified: Secondary | ICD-10-CM

## 2012-10-22 DIAGNOSIS — I2510 Atherosclerotic heart disease of native coronary artery without angina pectoris: Secondary | ICD-10-CM

## 2012-10-22 DIAGNOSIS — C61 Malignant neoplasm of prostate: Secondary | ICD-10-CM

## 2012-10-22 DIAGNOSIS — I10 Essential (primary) hypertension: Secondary | ICD-10-CM

## 2012-10-22 DIAGNOSIS — D509 Iron deficiency anemia, unspecified: Secondary | ICD-10-CM

## 2012-10-22 LAB — CBC WITH DIFFERENTIAL/PLATELET
Basophils Relative: 0.9 % (ref 0.0–3.0)
Eosinophils Relative: 2.9 % (ref 0.0–5.0)
Hemoglobin: 12.5 g/dL — ABNORMAL LOW (ref 13.0–17.0)
Lymphocytes Relative: 22.8 % (ref 12.0–46.0)
Monocytes Relative: 11.6 % (ref 3.0–12.0)
Neutro Abs: 2.9 10*3/uL (ref 1.4–7.7)
Neutrophils Relative %: 61.8 % (ref 43.0–77.0)
RBC: 4.07 Mil/uL — ABNORMAL LOW (ref 4.22–5.81)
WBC: 4.7 10*3/uL (ref 4.5–10.5)

## 2012-10-22 NOTE — Progress Notes (Signed)
Review of pertinent gastrointestinal problems: 1. 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 Vernell Barrier. Upper endoscopy February 2013 showed hiatal hernia. Colonoscopy February 2013 found a 7 mm adenomatous polyp. These did not explain his iron deficiency anemia and so he underwent repeat colonoscopy November 2013 it showed diverticulosis, his terminal ileum was normal. Capsule endoscopy February 2013 was read as showing "jejunal ulcerations felt do to NSAIDs which he was taking." EGD with small bowel enteroscopy February 2014 describes small bowel AVMs in the distal duodenum, proximal jejunum. His gastroenterologist felt that small bowel AVMs distal to the extent of the exam are highly probable and he considers referral to fact this hospital for double balloon enteroscopy if he experienced recurrent iron deficiency anemia.  He sees Dr. Arlan Organ, is on oral iron gets periodic iron transfusions. 2. 7mm adenomatous colon polyp removed February 2013, recall colonoscopy should be February 2018.  HPI: This is a   very pleasant 67 year old man whom I last saw weight was hospitalized at [place] about a month ago.  That was for iron deficiency anemia. We had his extensive gastric intestinal records sent here from his [place] gastroenterologist for review. See all those results summarized above.   He tells me that he was Diagnosed with prostate cancer yesterday.  He saw a doctor in [facility] but he does prefer to see a urologist here in [place].  No overt gi bleeding (until prostate biopsy)  He sees Dr. Myna Hidalgo for his iron deficiency anemia and underwent iron transfusion just to 3 weeks ago. I do not see CBC followup yet.    Past Medical History  Diagnosis Date  . Hypertension   . High cholesterol   . GERD (gastroesophageal reflux disease)   . GI bleed     a. Recurrent GI bleed, tx with IV iron. b. Per heme notes - likely AVMs 04/2012 (tx  with cauterization several months ago).  . Orthostatic hypotension     a. Tx with florinef.  . Hematuria     a. Urology note scan from 07/2012: cystoscopy without evidence for bladder lesion, only lateral hypertrophy of posterior urethra, bladder impression from BPH. b. Pt states he had "some tests" scheduled for later in July 2014.  . Stage III chronic kidney disease     a. Stage 3 (DM with complications ->CKD, peripheral neuropathy).  . Anemia, iron deficiency 03/24/2011    a. Recurrent GI bleed, tx with periodic iron infusions.  . 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 Sleep apnea     "had mask; couldn't sleep in it" (09/20/2012)  . Type II diabetes mellitus     a. Dx 1994, uncontrolled.   . Diabetic peripheral neuropathy 2014    foot pain.  . Chronic lower back pain   . Fatty liver     on CT of 11/2010  . AVM (arteriovenous malformation) of duodenum, acquired     egds in 01/2012, 04/2011  . Polyp, colonic     Colonoscopy 01/2012 "benign" polyp  . CAD (coronary artery disease)     a. Cath 09/2012: moderate borderline CAD in mid LAD/small diagonal branch, mild RCA stenosis, to be managed medically   . BPH (benign prostatic hyperplasia)   . Prostate cancer     Past Surgical History  Procedure Laterality Date  . Shoulder open rotator cuff repair Left 1980's  . Inguinal hernia repair Right 2011  . Lumbar disc surgery  1980's  Current Outpatient Prescriptions  Medication Sig Dispense Refill  . aspirin EC 81 MG EC tablet Take 1 tablet (81 mg total) by mouth daily.      [name] Kitchen atorvastatin (LIPITOR) 80 MG tablet Take 1 tablet (80 mg total) by mouth daily.  30 tablet  2  . carvedilol (COREG) 3.125 MG tablet Take 1 tablet (3.125 mg total) by mouth 2 (two) times daily with a meal.  60 tablet  2  . fludrocortisone (FLORINEF) 0.1 MG tablet Take 1 tablet (0.1 mg total) by mouth daily.  30 tablet  2  . furosemide (LASIX) 20 MG tablet Take 20 mg by mouth daily as needed for fluid.      [name] Kitchen gabapentin  (NEURONTIN) 300 MG capsule Take 1 capsule (300 mg total) by mouth 3 (three) times daily.  30 capsule  3  . insulin glargine (LANTUS) 100 UNIT/ML injection Inject 0.45 mLs (45 Units total) into the skin at bedtime.  5 pen  6  . insulin lispro (HUMALOG) 100 UNIT/ML injection Inject 5-8 Units into the skin 3 (three) times daily before meals.      . Insulin Syringe-Needle U-100 (BD INSULIN SYRINGE ULTRAFINE) 31G X 5/16" 0.5 ML MISC Use 3x a day as advised  100 each  1  . losartan (COZAAR) 50 MG tablet Take 50 mg by mouth daily.      . metFORMIN (GLUCOPHAGE) 500 MG tablet Take 1 tablet (500 mg total) by mouth 2 (two) times daily with a meal.      . moxifloxacin (VIGAMOX) 0.5 % ophthalmic solution Place 1 drop into both eyes 3 (three) times daily.      [name] Kitchen NEXIUM 40 MG capsule Take 40 mg by mouth 2 (two) times daily.       . nitroGLYCERIN (NITROSTAT) 0.4 MG SL tablet Place 1 tablet (0.4 mg total) under the tongue every 5 (five) minutes as needed for chest pain (up to 3 doses).  25 tablet  2  . tamsulosin (FLOMAX) 0.4 MG CAPS Take 0.4 mg by mouth daily after supper.      . vardenafil (LEVITRA) 10 MG tablet Take 1 tablet (10 mg total) by mouth daily as needed for erectile dysfunction.       No current facility-administered medications for this visit.    Allergies as of 10/22/2012  . (No Known Allergies)    Family History  Problem Relation Age of Onset  . Heart attack Brother     Died at 40  . Stroke Father     Died at 63  . Diabetes Sister   . Diabetes Mother   . Stomach cancer Brother   . Heart attack Sister     History   Social History  . Marital Status: Married    Spouse Name: N/A    Number of Children: 2  . Years of Education: N/A   Occupational History  . taken out of work due to back    Social History Main Topics  . Smoking status: Former Smoker -- 1.00 packs/day for 48 years    Types: Cigarettes    Quit date: 03/17/2012  . Smokeless tobacco: Never Used  . Alcohol Use: No      Comment: 09/20/2012 "Used to; stopped ~ 2009; never had problem w/it"  . Drug Use: No  . Sexually Active: No   Other Topics Concern  . Not on file   Social History Narrative  . No narrative on file      Physical Exam: BP  128/68  Pulse 88  Ht 5' 6.75" (1.695 m)  Wt 166 lb 8 oz (75.524 kg)  BMI 26.29 kg/m2 Constitutional: generally well-appearing Psychiatric: alert and oriented x3 Abdomen: soft, nontender, nondistended, no obvious ascites, no peritoneal signs, normal bowel sounds     Assessment and plan: 67 y.o. male with Iron deficiency anemia, possibly, likely from small bowel AVMs that were documented by a different gastroenterologist over the past year or 2, Recently diagnosed with prostate cancer  First, going to get a CBC on him today to see how he was responded to his recent iron transfusion. He'll probably need periodic blood counts to monitor his CBC. If his blood counts get too low despite oral and periodic iron transfusions then I will have to consider repeating his enteroscopy to treat small bowel AVMs. It is also highly likely that he has more distal small B. bowel AVMs. His previous gastroenterologist was considering referral to [facility] for double balloon enteroscopy. We may come to that as well.  He tells that he was diagnosed with prostate cancer yesterday by biopsy. This sounds like a transrectal biopsy. The doctor that performed at he tells me does not do surgery. He wants a referral to a urologist here in town and I will be happy to facilitate that for him.

## 2012-10-22 NOTE — Patient Instructions (Addendum)
Your physician recommends that you schedule a follow-up appointment in: KEEP APPOINTMENT WITH DR. Swaziland 12-16-12 AT 10;30

## 2012-10-22 NOTE — Patient Instructions (Addendum)
Referral to urologist at [facility] for newly diagnosed prostate cancer.  [facility] will call you next week with an appointment, they will get the information from you to request records from your [facility] physician. You will have labs checked today in the basement lab.  Please head down after you check out with the front desk  (cbc). Will decide on further labs based on your CBC today. If you have obvious GI bleeding, call Dr. Christella Hartigan.                                               We are excited to introduce MyChart, a new best-in-class service that provides you online access to important information in your electronic medical record. We want to make it easier for you to view your health information - all in one secure location - when and where you need it. We expect MyChart will enhance the quality of care and service we provide.  When you register for MyChart, you can:    View your test results.    Request appointments and receive appointment reminders via email.    Request medication renewals.    View your medical history, allergies, medications and immunizations.    Communicate with your physician's office through a password-protected site.    Conveniently print information such as your medication lists.  To find out if MyChart is right for you, please talk to a member of our clinical staff today. We will gladly answer your questions about this free health and wellness tool.  If you are age 52 or older and want a member of your family to have access to your record, you must provide written consent by completing a proxy form available at our office. Please speak to our clinical staff about guidelines regarding accounts for patients younger than age 63.  As you activate your MyChart account and need any technical assistance, please call the MyChart technical support line at [phone]) or email your question to [email or web address]. If you email your  question(s), please include your name, a return phone number and the best time to reach you.  If you have non-urgent health-related questions, you can send a message to our office through MyChart at [email or web address]. If you have a medical emergency, call 911.  Thank you for using MyChart as your new health and wellness resource!   MyChart licensed from [employer],  [id number]. Patents Pending.

## 2012-10-22 NOTE — Progress Notes (Signed)
ELECTROPHYSIOLOGY OFFICE NOTE  Patient ID: [name] MRN: [id number], DOB/AGE: 1946/02/28   Date of Visit: 10/22/2012  Primary Physician: Lemont Fillers., NP Primary Cardiologist: Peter Swaziland, MD Reason for Visit: Hospital follow-up  History of Present Illness  [name] is a 67 year old man with longstanding uncontrolled DM, CKD stage III, HTN, HL, former tobacco abuse, recurrent GI bleed and iron deficiency anemia who presented to [facility] with chest pain. He had no prior cardiac history but chest CT 06/2012 (done to f/u a previously enlarged lymph node) showed coronary artery calcification. Troponins were negative. CXR nonacute. ECG showed NSR with U wave in V4, otherwise no changes. Hgb was 11.8 on admission. Coreg was added for hypertension at low dose, keeping in mind history of orthostasis. In consult, Dr. Gala Romney discussed options of stress testing versus cath. Given multiple RFs + coronary calcifications on recent chest CT, it was decided to proceed with cath. He was hydrated in preparation for this. Cath performed by Dr. Swaziland on 09/21/12 demonstrated moderate borderline obstructive CAD in the mid LAD and small diagonal branch (focal 50-70% mid LAD, small diag with diffuse 70% prox, ramus intermediate 40-50% in prox vessel), mild 30% RCA and normal LV function. Medical management was recommended, especially in light of history of GI bleeds. He was hydrated. Post-cath creatinine remained stable. He was dishcarged home 09/22/2012. Of note, he had hematuria and in follow-up recently with Urology was told he had early prostate CA. He is also scheduled to follow-up with GI today regarding anemia.  Since discharge, he reports he has no cardiac complaints. He denies chest pain or shortness of breath. He denies palpitations, near syncope or syncope. He denies LE swelling, orthopnea, PND or recent weight gain. He is compliant and tolerating medications without difficulty.  Past  Medical History Past Medical History  Diagnosis Date  . Hypertension   . High cholesterol   . GERD (gastroesophageal reflux disease)   . GI bleed     a. Recurrent GI bleed, tx with IV iron. b. Per heme notes - likely AVMs 04/2012 (tx with cauterization several months ago).  . Orthostatic hypotension     a. Tx with florinef.  . Hematuria     a. Urology note scan from 07/2012: cystoscopy without evidence for bladder lesion, only lateral hypertrophy of posterior urethra, bladder impression from BPH. b. Pt states he had "some tests" scheduled for later in July 2014.  . Stage III chronic kidney disease     a. Stage 3 (DM with complications ->CKD, peripheral neuropathy).  . Anemia, iron deficiency 03/24/2011    a. Recurrent GI bleed, tx with periodic iron infusions.  . 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 Sleep apnea     "had mask; couldn't sleep in it" (09/20/2012)  . Type II diabetes mellitus     a. Dx 1994, uncontrolled.   . Diabetic peripheral neuropathy 2014    foot pain.  . Chronic lower back pain   . Fatty liver     on CT of 11/2010  . AVM (arteriovenous malformation) of duodenum, acquired     egds in 01/2012, 04/2011  . Polyp, colonic     Colonoscopy 01/2012 "benign" polyp  . CAD (coronary artery disease)     a. Cath 09/2012: moderate borderline CAD in mid LAD/small diagonal branch, mild RCA stenosis, to be managed medically   . BPH (benign prostatic hyperplasia)     Past Surgical History Past Surgical History  Procedure  Laterality Date  . Shoulder open rotator cuff repair Left 1980's  . Inguinal hernia repair Right 2011  . Lumbar disc surgery  1980's    Allergies/Intolerances No Known Allergies  Current Home Medications Current Outpatient Prescriptions  Medication Sig Dispense Refill  . aspirin EC 81 MG EC tablet Take 1 tablet (81 mg total) by mouth daily.      [name] Kitchen atorvastatin (LIPITOR) 80 MG tablet Take 1 tablet (80 mg total) by mouth daily.  30 tablet  2  . carvedilol  (COREG) 3.125 MG tablet Take 1 tablet (3.125 mg total) by mouth 2 (two) times daily with a meal.  60 tablet  2  . fludrocortisone (FLORINEF) 0.1 MG tablet Take 1 tablet (0.1 mg total) by mouth daily.  30 tablet  2  . furosemide (LASIX) 20 MG tablet Take 20 mg by mouth daily as needed for fluid.      [name] Kitchen gabapentin (NEURONTIN) 300 MG capsule Take 1 capsule (300 mg total) by mouth 3 (three) times daily.  30 capsule  3  . insulin glargine (LANTUS) 100 UNIT/ML injection Inject 0.45 mLs (45 Units total) into the skin at bedtime.  5 pen  6  . insulin lispro (HUMALOG) 100 UNIT/ML injection Inject 5-8 Units into the skin 3 (three) times daily before meals.      . Insulin Syringe-Needle U-100 (BD INSULIN SYRINGE ULTRAFINE) 31G X 5/16" 0.5 ML MISC Use 3x a day as advised  100 each  1  . losartan (COZAAR) 50 MG tablet Take 50 mg by mouth daily.      . metFORMIN (GLUCOPHAGE) 500 MG tablet Take 1 tablet (500 mg total) by mouth 2 (two) times daily with a meal.      . moxifloxacin (VIGAMOX) 0.5 % ophthalmic solution Place 1 drop into both eyes 3 (three) times daily.      [name] Kitchen NEXIUM 40 MG capsule Take 40 mg by mouth 2 (two) times daily.       . nitroGLYCERIN (NITROSTAT) 0.4 MG SL tablet Place 1 tablet (0.4 mg total) under the tongue every 5 (five) minutes as needed for chest pain (up to 3 doses).  25 tablet  2  . tamsulosin (FLOMAX) 0.4 MG CAPS Take 0.4 mg by mouth daily after supper.      . vardenafil (LEVITRA) 10 MG tablet Take 1 tablet (10 mg total) by mouth daily as needed for erectile dysfunction.       No current facility-administered medications for this visit.   Social History Social History  . Marital Status: Married    Spouse Name: N/A    Number of Children: N/A  . Years of Education: N/A   Occupational History  . taken out of work due to back    Social History Main Topics  . Smoking status: Former Smoker -- 1.00 packs/day for 48 years    Types: Cigarettes    Quit date: 03/17/2012  . Smokeless  tobacco: Never Used  . Alcohol Use: Yes     Comment: 09/20/2012 "Used to; stopped ~ 2009; never had problem w/it"  . Drug Use: No   Review of Systems General: No chills, fever, night sweats or weight changes Cardiovascular: No chest pain, dyspnea on exertion, edema, orthopnea, palpitations, paroxysmal nocturnal dyspnea Dermatological: No rash, lesions or masses Respiratory: No cough, dyspnea Urologic: No hematuria, dysuria Abdominal: No nausea, vomiting, diarrhea, bright red blood per rectum, melena, or hematemesis Neurologic: No visual changes, weakness, changes in mental status All other systems reviewed and  are otherwise negative except as noted above.  Physical Exam Vitals: Blood pressure 136/84, pulse 87, height 5\' 7"  (1.702 m), weight 166 lb 6.4 oz (75.479 kg).  General: Well developed, well appearing 67 y.o. male in no acute distress. HEENT: Normocephalic, atraumatic. EOMs intact. Sclera nonicteric. Oropharynx clear.  Neck: Supple. No JVD. Lungs: Respirations regular and unlabored, CTA bilaterally. No wheezes, rales or rhonchi. Heart: RRR. S1, S2 present. No murmurs, rub, S3 or S4. Abdomen: Soft, non-distended.  Extremities: No clubbing, cyanosis or edema. PT/Radials 2+ and equal bilaterally. Right radial site is well healed. Psych: Normal affect. Neuro: Alert and oriented X 3. Moves all extremities spontaneously.   Diagnostics Cardiac catheterization 09/21/2012 Procedural Findings:  Hemodynamics:  AO 149/76 mean 107 mm Hg  LV 147/11 mm Hg  Coronary angiography:  Coronary dominance: right  Left mainstem: Normal  Left anterior descending (LAD): Focal 50-70% mid LAD stenosis. Small diagonal with diffuse 70% proximal disease.  Ramus intermediate is a large branch with focal 40-50% disease in the proximal vessel.  Left circumflex (LCx): minor wall irregularities.  Right coronary artery (RCA): 30% mid vessel.  Left ventriculography: Left ventricular systolic function is  normal, LVEF is estimated at 55-65%, there is no significant mitral regurgitation  Final Conclusions:  1. 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  2. Normal LV function.  Recommendations: Medical management especially in light of his history of recurrent GI bleeds. Will hydrate. Repeat CBC in am.   Assessment and Plan 1. Chest pain with known moderate CAD in mid LAD and small diagonal branch, to be managed medically   - CP has resolved without recurrence  - continue medical therapy 2. HTN with history of orthostatic hypotension, on Florinef   - normotensive today  - continue current regimen  Signed, Chaney Maclaren, PA-C 10/22/2012, 2:01 PM

## 2012-10-25 ENCOUNTER — Other Ambulatory Visit: Payer: Self-pay

## 2012-10-25 ENCOUNTER — Ambulatory Visit (HOSPITAL_BASED_OUTPATIENT_CLINIC_OR_DEPARTMENT_OTHER): Payer: Medicare Other | Admitting: Hematology & Oncology

## 2012-10-25 ENCOUNTER — Other Ambulatory Visit (HOSPITAL_BASED_OUTPATIENT_CLINIC_OR_DEPARTMENT_OTHER): Payer: Medicare Other | Admitting: Lab

## 2012-10-25 VITALS — HR 102 | Temp 97.8°F | Resp 18 | Ht 66.0 in | Wt 163.0 lb

## 2012-10-25 DIAGNOSIS — K922 Gastrointestinal hemorrhage, unspecified: Secondary | ICD-10-CM

## 2012-10-25 DIAGNOSIS — E119 Type 2 diabetes mellitus without complications: Secondary | ICD-10-CM

## 2012-10-25 DIAGNOSIS — D509 Iron deficiency anemia, unspecified: Secondary | ICD-10-CM

## 2012-10-25 DIAGNOSIS — D500 Iron deficiency anemia secondary to blood loss (chronic): Secondary | ICD-10-CM

## 2012-10-25 DIAGNOSIS — N189 Chronic kidney disease, unspecified: Secondary | ICD-10-CM

## 2012-10-25 DIAGNOSIS — C61 Malignant neoplasm of prostate: Secondary | ICD-10-CM

## 2012-10-25 LAB — CBC WITH DIFFERENTIAL (CANCER CENTER ONLY)
BASO#: 0 10*3/uL (ref 0.0–0.2)
EOS%: 3 % (ref 0.0–7.0)
Eosinophils Absolute: 0.1 10*3/uL (ref 0.0–0.5)
HCT: 34.9 % — ABNORMAL LOW (ref 38.7–49.9)
HGB: 12 g/dL — ABNORMAL LOW (ref 13.0–17.1)
LYMPH#: 0.9 10*3/uL (ref 0.9–3.3)
MCH: 30.8 pg (ref 28.0–33.4)
MCHC: 34.4 g/dL (ref 32.0–35.9)
MONO%: 12.4 % (ref 0.0–13.0)
NEUT%: 64.2 % (ref 40.0–80.0)
RBC: 3.89 10*6/uL — ABNORMAL LOW (ref 4.20–5.70)

## 2012-10-25 LAB — FERRITIN CHCC: Ferritin: 180 ng/ml (ref 22–316)

## 2012-10-25 LAB — IRON AND TIBC CHCC
%SAT: 40 % (ref 20–55)
Iron: 81 ug/dL (ref 42–163)
UIBC: 122 ug/dL (ref 117–376)

## 2012-10-25 NOTE — Progress Notes (Signed)
This office note has been dictated.

## 2012-10-26 ENCOUNTER — Telehealth: Payer: Self-pay | Admitting: Hematology & Oncology

## 2012-10-26 NOTE — Telephone Encounter (Signed)
Pt aware of 8-18 4 pm appointment with Dr Retta Diones

## 2012-10-26 NOTE — Progress Notes (Signed)
CC:   Sandford Craze, NP [name], MD Bertram Millard. Dahlstedt, M.D.  DIAGNOSIS: 1. Recurrent iron-deficiency anemia secondary to gastrointestinal     bleeding. 2. Newly diagnosed prostate cancer-localized. 3. Diabetes.  CURRENT THERAPY: 1. IV iron as indicated. 2. Aranesp 300 mcg subcu as needed for hemoglobin less than 10.  INTERIM HISTORY:  [name] comes in for followup.  Shockingly enough, he now has a new problem.  He has been seen by Dr. Lindley Magnus at [facility].  He was having some hematuria.  I guess the PSA must have been elevated.  That is something that we just have not checked here.  He had prostate biopsies.  He says he has prostate cancer.  We did call Dr. Shearon Balo office try to get records.  He had a CT of the abdomen and pelvis at [facility].  This was done in June.  This did not show any obvious metastatic disease.  He wants to be treated in [place].  I will make a referral to [facility].  He has seen Dr. Christella Hartigan of [facility].  I think he has had a colonoscopy which he says was negative. He has had recent cardiac cath.  This was done back in early July.  From what he says did not show any blockages.  A lot has been going on with [name] since we last saw him, which is somewhat unfortunate.  On the cardiac cath that he did have, he had some moderate obstructive coronary artery disease in the mid LAD and small diagonal branch.  PHYSICAL EXAMINATION:  General:  This is a well-developed, well- nourished black gentleman in no obvious distress.  Vital signs: Temperature of 97.8, pulse 102, respiratory rate 18, blood pressure 129/78.  Weight is 163.  Head and neck:  Normocephalic, atraumatic skull.  There are no ocular or oral lesions.  There are no palpable cervical or supraclavicular lymph nodes.  Lungs:  Clear bilaterally. Cardiac:  Regular rate and rhythm with a normal S1, S2.  There are no murmurs, rubs or bruits.   Abdomen:  Soft.  He has good bowel sounds. There is no palpable abdominal mass.  There is no fluid wave.  There is no palpable hepatosplenomegaly.  Extremities:  Show no clubbing, cyanosis or edema.  Neurological:  Shows no focal neurological deficits.  LABORATORY STUDIES:  Show a white cell count of 4.6, hemoglobin 12, hematocrit 35, platelet count 210.  MCV is 90.  IMPRESSION:  Mr. Szabo is a 67 year old gentleman with multiple medical problems now.  Long-term, his diabetes will be the biggest thing that will hurt him.  He is probably already showing coronary artery disease from this.  We are trying to get records about the prostate cancer.  We did make a referral over to [facility].  I did order a CT of the chest and a bone scan as part of the staging studies.  I will have Mr. Vickrey come back in a couple months.  By then, the prostate should be taken care of.    ______________________________ [name], M.D. PRE/MEDQ  D:  10/25/2012  T:  10/26/2012  Job:  [id number]

## 2012-10-27 ENCOUNTER — Encounter: Payer: Self-pay | Admitting: Family

## 2012-10-27 DIAGNOSIS — C61 Malignant neoplasm of prostate: Secondary | ICD-10-CM | POA: Insufficient documentation

## 2012-10-31 ENCOUNTER — Emergency Department (HOSPITAL_BASED_OUTPATIENT_CLINIC_OR_DEPARTMENT_OTHER): Payer: Medicare Other

## 2012-10-31 ENCOUNTER — Inpatient Hospital Stay (HOSPITAL_BASED_OUTPATIENT_CLINIC_OR_DEPARTMENT_OTHER)
Admission: EM | Admit: 2012-10-31 | Discharge: 2012-11-01 | DRG: 312 | Disposition: A | Discharge status: Home / Self Care | Payer: Medicare Other | Attending: Internal Medicine | Admitting: Internal Medicine

## 2012-10-31 ENCOUNTER — Inpatient Hospital Stay (HOSPITAL_COMMUNITY): Payer: Medicare Other

## 2012-10-31 ENCOUNTER — Encounter (HOSPITAL_BASED_OUTPATIENT_CLINIC_OR_DEPARTMENT_OTHER): Payer: Self-pay

## 2012-10-31 ENCOUNTER — Telehealth: Payer: Self-pay | Admitting: Internal Medicine

## 2012-10-31 DIAGNOSIS — E1149 Type 2 diabetes mellitus with other diabetic neurological complication: Secondary | ICD-10-CM | POA: Diagnosis present

## 2012-10-31 DIAGNOSIS — I10 Essential (primary) hypertension: Secondary | ICD-10-CM | POA: Diagnosis present

## 2012-10-31 DIAGNOSIS — E7800 Pure hypercholesterolemia, unspecified: Secondary | ICD-10-CM | POA: Diagnosis present

## 2012-10-31 DIAGNOSIS — I6789 Other cerebrovascular disease: Secondary | ICD-10-CM | POA: Diagnosis present

## 2012-10-31 DIAGNOSIS — R42 Dizziness and giddiness: Secondary | ICD-10-CM | POA: Diagnosis present

## 2012-10-31 DIAGNOSIS — B353 Tinea pedis: Secondary | ICD-10-CM

## 2012-10-31 DIAGNOSIS — R319 Hematuria, unspecified: Secondary | ICD-10-CM

## 2012-10-31 DIAGNOSIS — I6350 Cerebral infarction due to unspecified occlusion or stenosis of unspecified cerebral artery: Secondary | ICD-10-CM

## 2012-10-31 DIAGNOSIS — N179 Acute kidney failure, unspecified: Secondary | ICD-10-CM

## 2012-10-31 DIAGNOSIS — IMO0002 Reserved for concepts with insufficient information to code with codable children: Secondary | ICD-10-CM | POA: Diagnosis present

## 2012-10-31 DIAGNOSIS — E1140 Type 2 diabetes mellitus with diabetic neuropathy, unspecified: Secondary | ICD-10-CM

## 2012-10-31 DIAGNOSIS — I129 Hypertensive chronic kidney disease with stage 1 through stage 4 chronic kidney disease, or unspecified chronic kidney disease: Secondary | ICD-10-CM | POA: Diagnosis present

## 2012-10-31 DIAGNOSIS — C61 Malignant neoplasm of prostate: Secondary | ICD-10-CM

## 2012-10-31 DIAGNOSIS — N529 Male erectile dysfunction, unspecified: Secondary | ICD-10-CM

## 2012-10-31 DIAGNOSIS — G4733 Obstructive sleep apnea (adult) (pediatric): Secondary | ICD-10-CM | POA: Diagnosis present

## 2012-10-31 DIAGNOSIS — E785 Hyperlipidemia, unspecified: Secondary | ICD-10-CM | POA: Diagnosis present

## 2012-10-31 DIAGNOSIS — K219 Gastro-esophageal reflux disease without esophagitis: Secondary | ICD-10-CM | POA: Diagnosis present

## 2012-10-31 DIAGNOSIS — Z87891 Personal history of nicotine dependence: Secondary | ICD-10-CM

## 2012-10-31 DIAGNOSIS — N189 Chronic kidney disease, unspecified: Secondary | ICD-10-CM

## 2012-10-31 DIAGNOSIS — Z79899 Other long term (current) drug therapy: Secondary | ICD-10-CM

## 2012-10-31 DIAGNOSIS — I639 Cerebral infarction, unspecified: Secondary | ICD-10-CM

## 2012-10-31 DIAGNOSIS — I951 Orthostatic hypotension: Principal | ICD-10-CM | POA: Diagnosis present

## 2012-10-31 DIAGNOSIS — M549 Dorsalgia, unspecified: Secondary | ICD-10-CM

## 2012-10-31 DIAGNOSIS — E1165 Type 2 diabetes mellitus with hyperglycemia: Secondary | ICD-10-CM | POA: Diagnosis present

## 2012-10-31 DIAGNOSIS — Z720 Tobacco use: Secondary | ICD-10-CM

## 2012-10-31 DIAGNOSIS — I2510 Atherosclerotic heart disease of native coronary artery without angina pectoris: Secondary | ICD-10-CM | POA: Diagnosis present

## 2012-10-31 DIAGNOSIS — E1142 Type 2 diabetes mellitus with diabetic polyneuropathy: Secondary | ICD-10-CM | POA: Diagnosis present

## 2012-10-31 DIAGNOSIS — E869 Volume depletion, unspecified: Secondary | ICD-10-CM | POA: Diagnosis present

## 2012-10-31 DIAGNOSIS — Z794 Long term (current) use of insulin: Secondary | ICD-10-CM

## 2012-10-31 DIAGNOSIS — Z8546 Personal history of malignant neoplasm of prostate: Secondary | ICD-10-CM

## 2012-10-31 DIAGNOSIS — R490 Dysphonia: Secondary | ICD-10-CM

## 2012-10-31 DIAGNOSIS — D509 Iron deficiency anemia, unspecified: Secondary | ICD-10-CM

## 2012-10-31 DIAGNOSIS — N184 Chronic kidney disease, stage 4 (severe): Secondary | ICD-10-CM | POA: Diagnosis present

## 2012-10-31 DIAGNOSIS — E876 Hypokalemia: Secondary | ICD-10-CM | POA: Diagnosis present

## 2012-10-31 LAB — URINALYSIS, ROUTINE W REFLEX MICROSCOPIC
Leukocytes, UA: NEGATIVE
Nitrite: NEGATIVE
Specific Gravity, Urine: 1.013 (ref 1.005–1.030)
pH: 6 (ref 5.0–8.0)

## 2012-10-31 LAB — TROPONIN I
Troponin I: <0.3 ng/mL (ref <0.30)
Troponin I: <0.3 ng/mL (ref <0.30)

## 2012-10-31 LAB — CBC WITH DIFFERENTIAL/PLATELET
Basophils Relative: 0 % (ref 0–1)
HCT: 32.5 % — ABNORMAL LOW (ref 39.0–52.0)
Hemoglobin: 11.4 g/dL — ABNORMAL LOW (ref 13.0–17.0)
Lymphs Abs: 1.1 10*3/uL (ref 0.7–4.0)
MCH: 30.7 pg (ref 26.0–34.0)
MCHC: 35.1 g/dL (ref 30.0–36.0)
Monocytes Absolute: 0.9 10*3/uL (ref 0.1–1.0)
Monocytes Relative: 18 % — ABNORMAL HIGH (ref 3–12)
Neutro Abs: 2.9 10*3/uL (ref 1.7–7.7)
RBC: 3.71 MIL/uL — ABNORMAL LOW (ref 4.22–5.81)

## 2012-10-31 LAB — COMPREHENSIVE METABOLIC PANEL
Albumin: 3 g/dL — ABNORMAL LOW (ref 3.5–5.2)
BUN: 25 mg/dL — ABNORMAL HIGH (ref 6–23)
CO2: 30 mEq/L (ref 19–32)
Chloride: 100 mEq/L (ref 96–112)
Creatinine, Ser: 1.7 mg/dL — ABNORMAL HIGH (ref 0.50–1.35)
Glucose, Bld: 212 mg/dL — ABNORMAL HIGH (ref 70–99)
Sodium: 140 mEq/L (ref 135–145)
Total Protein: 6.8 g/dL (ref 6.0–8.3)

## 2012-10-31 LAB — CBC
Hemoglobin: 10.1 g/dL — ABNORMAL LOW (ref 13.0–17.0)
MCHC: 36.1 g/dL — ABNORMAL HIGH (ref 30.0–36.0)
RDW: 15.1 % (ref 11.5–15.5)

## 2012-10-31 LAB — URINE MICROSCOPIC-ADD ON

## 2012-10-31 LAB — LIPID PANEL
Total CHOL/HDL Ratio: 5.3 RATIO
VLDL: 41 mg/dL — ABNORMAL HIGH (ref 0–40)

## 2012-10-31 LAB — GLUCOSE, CAPILLARY: Glucose-Capillary: 148 mg/dL — ABNORMAL HIGH (ref 70–99)

## 2012-10-31 LAB — CREATININE, SERUM
Creatinine, Ser: 1.47 mg/dL — ABNORMAL HIGH (ref 0.50–1.35)
GFR calc non Af Amer: 48 mL/min — ABNORMAL LOW (ref >90)

## 2012-10-31 MED ORDER — SODIUM CHLORIDE 0.9 % IJ SOLN
3.0000 mL | Freq: Two times a day (BID) | INTRAMUSCULAR | Status: DC
  Administered 2012-10-31 – 2012-11-01 (×2): 3 mL via INTRAVENOUS

## 2012-10-31 MED ORDER — INSULIN ASPART 100 UNIT/ML ~~LOC~~ SOLN: 0.0000 [IU] | Freq: Every day | SUBCUTANEOUS | Status: DC

## 2012-10-31 MED ORDER — CLOPIDOGREL BISULFATE 75 MG PO TABS
75.0000 mg | ORAL_TABLET | Freq: Every day | ORAL | Status: DC
  Administered 2012-10-31 – 2012-11-01 (×2): 75 mg via ORAL
  Filled 2012-10-31 (×3): qty 1

## 2012-10-31 MED ORDER — ONDANSETRON HCL 4 MG/2ML IJ SOLN: 4.0000 mg | Freq: Three times a day (TID) | INTRAMUSCULAR | Status: AC | PRN

## 2012-10-31 MED ORDER — SODIUM CHLORIDE 0.9 % IV SOLN
INTRAVENOUS | Status: AC
  Administered 2012-10-31: 07:00:00 via INTRAVENOUS

## 2012-10-31 MED ORDER — HEPARIN SODIUM (PORCINE) 5000 UNIT/ML IJ SOLN
5000.0000 [IU] | Freq: Three times a day (TID) | INTRAMUSCULAR | Status: DC
  Administered 2012-10-31 – 2012-11-01 (×5): 5000 [IU] via SUBCUTANEOUS
  Filled 2012-10-31 (×8): qty 1

## 2012-10-31 MED ORDER — SODIUM CHLORIDE 0.9 % IV BOLUS (SEPSIS)
1000.0000 mL | Freq: Once | INTRAVENOUS | Status: AC
  Administered 2012-10-31: 1000 mL via INTRAVENOUS

## 2012-10-31 MED ORDER — INSULIN ASPART 100 UNIT/ML ~~LOC~~ SOLN
0.0000 [IU] | Freq: Three times a day (TID) | SUBCUTANEOUS | Status: DC
  Administered 2012-10-31: 8 [IU] via SUBCUTANEOUS
  Administered 2012-10-31: 3 [IU] via SUBCUTANEOUS
  Administered 2012-10-31: 15 [IU] via SUBCUTANEOUS
  Administered 2012-11-01: 2 [IU] via SUBCUTANEOUS
  Administered 2012-11-01: 3 [IU] via SUBCUTANEOUS

## 2012-10-31 MED ORDER — ACETAMINOPHEN 325 MG PO TABS
650.0000 mg | ORAL_TABLET | Freq: Four times a day (QID) | ORAL | Status: DC | PRN
  Administered 2012-10-31 – 2012-11-01 (×2): 650 mg via ORAL
  Filled 2012-10-31 (×2): qty 2

## 2012-10-31 MED ORDER — SODIUM CHLORIDE 0.9 % IV SOLN
INTRAVENOUS | Status: AC
  Administered 2012-10-31: 05:00:00 via INTRAVENOUS

## 2012-10-31 MED ORDER — INSULIN GLARGINE 100 UNIT/ML ~~LOC~~ SOLN
45.0000 [IU] | Freq: Every day | SUBCUTANEOUS | Status: DC
  Administered 2012-10-31: 45 [IU] via SUBCUTANEOUS
  Filled 2012-10-31 (×2): qty 0.45

## 2012-10-31 MED ORDER — FLUDROCORTISONE ACETATE 0.1 MG PO TABS
0.1000 mg | ORAL_TABLET | Freq: Every day | ORAL | Status: DC
  Administered 2012-10-31 – 2012-11-01 (×2): 0.1 mg via ORAL
  Filled 2012-10-31 (×2): qty 1

## 2012-10-31 MED ORDER — ATORVASTATIN CALCIUM 80 MG PO TABS
80.0000 mg | ORAL_TABLET | Freq: Every day | ORAL | Status: DC
  Administered 2012-10-31 – 2012-11-01 (×2): 80 mg via ORAL
  Filled 2012-10-31 (×2): qty 1

## 2012-10-31 MED ORDER — CARVEDILOL 3.125 MG PO TABS
3.1250 mg | ORAL_TABLET | Freq: Two times a day (BID) | ORAL | Status: DC
  Administered 2012-10-31 – 2012-11-01 (×3): 3.125 mg via ORAL
  Filled 2012-10-31 (×6): qty 1

## 2012-10-31 MED ORDER — POTASSIUM CHLORIDE CRYS ER 20 MEQ PO TBCR
40.0000 meq | EXTENDED_RELEASE_TABLET | Freq: Once | ORAL | Status: AC
  Administered 2012-10-31: 40 meq via ORAL
  Filled 2012-10-31: qty 1

## 2012-10-31 MED ORDER — NITROGLYCERIN 0.4 MG SL SUBL
0.4000 mg | SUBLINGUAL_TABLET | SUBLINGUAL | Status: DC | PRN
  Filled 2012-10-31: qty 50

## 2012-10-31 MED ORDER — PANTOPRAZOLE SODIUM 40 MG PO TBEC
80.0000 mg | DELAYED_RELEASE_TABLET | Freq: Every day | ORAL | Status: DC
  Administered 2012-10-31 – 2012-11-01 (×2): 80 mg via ORAL
  Filled 2012-10-31: qty 2
  Filled 2012-10-31 (×2): qty 1

## 2012-10-31 NOTE — Consult Note (Addendum)
Referring Physician: Paya    Chief Complaint: Dizziness  HPI: [name] is a RH 67 y.o. male who reports that he went to bed normal on Thursday.  He awakened in the middle of the night and was dizzy.  He has been dizzy ever since.  He is dizzy in all positions and reports that it is worse when he is lying down because he feels that the bed is rocking. He has been able to walk but feels off balance when walking.  The patient reports some nausea and vomiting.  There has been no asociated weakness, numbness , difficulty with speech or double vision.    Date last known well: Date: 10/28/2012 Time last known well: Time: 22:00 tPA Given: No: Outside time window  Past Medical History  Diagnosis Date  . Hypertension   . High cholesterol   . GERD (gastroesophageal reflux disease)   . GI bleed     a. Recurrent GI bleed, tx with IV iron. b. Per heme notes - likely AVMs 04/2012 (tx with cauterization several months ago).  . Orthostatic hypotension     a. Tx with florinef.  . Hematuria     a. Urology note scan from 07/2012: cystoscopy without evidence for bladder lesion, only lateral hypertrophy of posterior urethra, bladder impression from BPH. b. Pt states he had "some tests" scheduled for later in July 2014.  . Stage III chronic kidney disease     a. Stage 3 (DM with complications ->CKD, peripheral neuropathy).  . Anemia, iron deficiency 03/24/2011    a. Recurrent GI bleed, tx with periodic iron infusions.  . 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 Sleep apnea     "had mask; couldn't sleep in it" (09/20/2012)  . Type II diabetes mellitus     a. Dx 1994, uncontrolled.   . Diabetic peripheral neuropathy 2014    foot pain.  . Chronic lower back pain   . Fatty liver     on CT of 11/2010  . AVM (arteriovenous malformation) of duodenum, acquired     egds in 01/2012, 04/2011  . Polyp, colonic     Colonoscopy 01/2012 "benign" polyp  . CAD (coronary artery disease)     a. Cath 09/2012: moderate  borderline CAD in mid LAD/small diagonal branch, mild RCA stenosis, to be managed medically   . BPH (benign prostatic hyperplasia)   . Prostate cancer     Past Surgical History  Procedure Laterality Date  . Shoulder open rotator cuff repair Left 1980's  . Inguinal hernia repair Right 2011  . Lumbar disc surgery  1980's    Family History  Problem Relation Age of Onset  . Heart attack Brother     Died at 9  . Stroke Father     Died at 24  . Diabetes Sister   . Diabetes Mother   . Stomach cancer Brother   . Heart attack Sister    Social History:  reports that he quit smoking about 7 months ago. His smoking use included Cigarettes. He has a 48 pack-year smoking history. He has never used smokeless tobacco. He reports that he does not drink alcohol or use illicit drugs.  Allergies: No Known Allergies  Medications:  I have reviewed the patient's current medications. Prior to Admission:  Prescriptions prior to admission  Medication Sig Dispense Refill  . aspirin EC 81 MG EC tablet Take 1 tablet (81 mg total) by mouth daily.      [name] Kitchen atorvastatin (LIPITOR) 80  MG tablet Take 1 tablet (80 mg total) by mouth daily.  30 tablet  2  . carvedilol (COREG) 3.125 MG tablet Take 1 tablet (3.125 mg total) by mouth 2 (two) times daily with a meal.  60 tablet  2  . carvedilol (COREG) 3.125 MG tablet       . COZAAR 50 MG tablet       . fludrocortisone (FLORINEF) 0.1 MG tablet Take 1 tablet (0.1 mg total) by mouth daily.  30 tablet  2  . furosemide (LASIX) 20 MG tablet Take 20 mg by mouth daily as needed for fluid.      [name] Kitchen gabapentin (NEURONTIN) 300 MG capsule Take 1 capsule (300 mg total) by mouth 3 (three) times daily.  30 capsule  3  . gabapentin (NEURONTIN) 300 MG capsule       . HUMALOG 100 UNIT/ML SOCT       . Hydrocodone-APAP-Dietary Prod 7.5-750 MG MISC       . insulin glargine (LANTUS) 100 UNIT/ML injection Inject 0.45 mLs (45 Units total) into the skin at bedtime.  5 pen  6  . insulin  lispro (HUMALOG) 100 UNIT/ML injection Inject 5-8 Units into the skin 3 (three) times daily before meals.      . Insulin Syringe-Needle U-100 (BD INSULIN SYRINGE ULTRAFINE) 31G X 5/16" 0.5 ML MISC Use 3x a day as advised  100 each  1  . levofloxacin (LEVAQUIN) 500 MG tablet       . losartan (COZAAR) 50 MG tablet Take 50 mg by mouth daily.      . metFORMIN (GLUCOPHAGE) 500 MG tablet Take 1 tablet (500 mg total) by mouth 2 (two) times daily with a meal.      . moxifloxacin (VIGAMOX) 0.5 % ophthalmic solution Place 1 drop into both eyes 3 (three) times daily.      [name] Kitchen NEXIUM 40 MG capsule Take 40 mg by mouth 2 (two) times daily.       [name] Kitchen NEXIUM 40 MG packet       . nitroGLYCERIN (NITROSTAT) 0.4 MG SL tablet Place 1 tablet (0.4 mg total) under the tongue every 5 (five) minutes as needed for chest pain (up to 3 doses).  25 tablet  2  . tamsulosin (FLOMAX) 0.4 MG CAPS capsule       . tamsulosin (FLOMAX) 0.4 MG CAPS Take 0.4 mg by mouth daily after supper.      . vardenafil (LEVITRA) 10 MG tablet Take 1 tablet (10 mg total) by mouth daily as needed for erectile dysfunction.       Scheduled: . sodium chloride   Intravenous STAT  . atorvastatin  80 mg Oral Daily  . carvedilol  3.125 mg Oral BID WC  . clopidogrel  75 mg Oral Q breakfast  . fludrocortisone  0.1 mg Oral Daily  . heparin  5,000 Units Subcutaneous Q8H  . insulin aspart  0-15 Units Subcutaneous TID WC  . insulin aspart  0-5 Units Subcutaneous QHS  . insulin glargine  45 Units Subcutaneous QHS  . pantoprazole  80 mg Oral Q1200  . sodium chloride  3 mL Intravenous Q12H    ROS: History obtained from the patient  General ROS: negative for - chills, fatigue, fever, night sweats, weight gain or weight loss Psychological ROS: negative for - behavioral disorder, hallucinations, memory difficulties, mood swings or suicidal ideation Ophthalmic ROS: negative for - blurry vision, double vision, eye pain or loss of vision ENT ROS: negative for -  epistaxis, nasal discharge, oral lesions, sore throat, tinnitus or vertigo Allergy and Immunology ROS: negative for - hives or itchy/watery eyes Hematological and Lymphatic ROS: negative for - bleeding problems, bruising or swollen lymph nodes Endocrine ROS: negative for - galactorrhea, hair pattern changes, polydipsia/polyuria or temperature intolerance Respiratory ROS: negative for - cough, hemoptysis, shortness of breath or wheezing Cardiovascular ROS: negative for - chest pain, dyspnea on exertion, edema or irregular heartbeat Gastrointestinal ROS: as noted in HPI Genito-Urinary ROS: negative for - dysuria, hematuria, incontinence or urinary frequency/urgency Musculoskeletal ROS: negative for - joint swelling or muscular weakness Neurological ROS: as noted in HPI Dermatological ROS: negative for rash and skin lesion changes  Physical Examination: Blood pressure 156/88, pulse 100, temperature 97.4 F (36.3 C), temperature source Oral, resp. rate 18, SpO2 100.00%.  Neurologic Examination: Mental Status: Alert, oriented, thought content appropriate.  Speech fluent without evidence of aphasia.  Able to follow 3 step commands without difficulty. Cranial Nerves: II: Discs flat bilaterally; Visual fields grossly normal, pupils equal, round, reactive to light and accommodation III,IV, VI: ptosis not present, extra-ocular motions intact bilaterally V,VII: decreased right NLF, facial light touch sensation normal bilaterally VIII: hearing normal bilaterally IX,X: gag reflex present XI: bilateral shoulder shrug XII: midline tongue extension Motor: Right : Upper extremity   5/5 w/pronator drift and 5-/5 hand grip    Left:     Upper extremity   5/5  Lower extremity   5/5          Lower extremity   5/5 Tone and bulk:normal tone throughout; no atrophy noted Sensory: Pinprick and light touch intact throughout, bilaterally Deep Tendon Reflexes: 2+ in the upper extremities and absent in the lower  extremities Plantars: Right: downgoing   Left: downgoing Cerebellar: normal finger-to-nose and normal heel-to-shin test Gait: Unable to test CV: pulses palpable throughout   Laboratory Studies:  Basic Metabolic Panel:  Recent Labs Lab 10/31/12 0214  NA 140  K 3.2*  CL 100  CO2 30  GLUCOSE 212*  BUN 25*  CREATININE 1.70*  CALCIUM 9.9    Liver Function Tests:  Recent Labs Lab 10/31/12 0214  AST 15  ALT 15  ALKPHOS 106  BILITOT 0.2*  PROT 6.8  ALBUMIN 3.0*   No results found for this basename: LIPASE, AMYLASE,  in the last 168 hours No results found for this basename: AMMONIA,  in the last 168 hours  CBC:  Recent Labs Lab 10/25/12 1156 10/31/12 0214  WBC 4.6 5.1  NEUTROABS 3.0 2.9  HGB 12.0* 11.4*  HCT 34.9* 32.5*  MCV 90 87.6  PLT 210 237    Cardiac Enzymes:  Recent Labs Lab 10/31/12 0214  TROPONINI <0.30    BNP: No components found with this basename: POCBNP,   CBG: No results found for this basename: GLUCAP,  in the last 168 hours  Microbiology: Results for orders placed in visit on 05/24/12  URINE CULTURE     Status: None   Collection Time    05/24/12 10:15 AM      Result Value Range Status   Colony Count NO GROWTH   Final   Organism ID, Bacteria NO GROWTH   Final    Coagulation Studies: No results found for this basename: LABPROT, INR,  in the last 72 hours  Urinalysis:  Recent Labs Lab 10/31/12 0416  COLORURINE YELLOW  LABSPEC 1.013  PHURINE 6.0  GLUCOSEU 100*  HGBUR TRACE*  BILIRUBINUR NEGATIVE  KETONESUR NEGATIVE  PROTEINUR 100*  UROBILINOGEN 0.2  NITRITE NEGATIVE  LEUKOCYTESUR NEGATIVE    Lipid Panel:    Component Value Date/Time   CHOL 152 09/21/2012 0220   TRIG 241* 09/21/2012 0220   HDL 27* 09/21/2012 0220   CHOLHDL 5.6 09/21/2012 0220   VLDL 48* 09/21/2012 0220   LDLCALC 77 09/21/2012 0220    HgbA1C:  Lab Results  Component Value Date   HGBA1C 9.1* 09/20/2012    Urine Drug Screen:   No results found for  this basename: labopia, cocainscrnur, labbenz, amphetmu, thcu, labbarb    Alcohol Level: No results found for this basename: ETH,  in the last 168 hours  Other results: EKG: normal sinus rhythm at 97 bpm.  Imaging: Dg Chest 2 View  10/31/2012   *RADIOLOGY REPORT*  Clinical Data: Hypertension  CHEST - 2 VIEW  Comparison: Prior radiograph from 09/20/2012.  Findings: Cardiac and mediastinal silhouettes are stable in size and contour, and remain within normal limits.  The lungs are normally inflated.  No airspace consolidation, pleural effusion, pulmonary edema, or pneumothorax.  No acute osseous abnormality.  IMPRESSION: No acute cardiopulmonary process.   Original Report Authenticated By: Rise Mu, M.D.   Ct Head Wo Contrast  10/31/2012   *RADIOLOGY REPORT*  Clinical Data: Hypertension  CT HEAD WITHOUT CONTRAST  Technique:  Contiguous axial images were obtained from the base of the skull through the vertex without contrast.  Comparison: None.  Findings: There is no acute intracranial hemorrhage.  The CSF containing spaces are mildly prominent, consistent with atrophy. Ill-defined vague hypodensity is seen within the left periventricular white matter, which may represent acute/subacute ischemic infarct (series 2, image 22). Additional hypodensities within the periventricular white matter are most consistent with chronic small vessel ischemic disease.  There is no midline shift or mass lesion. No extra-axial fluid collection.  Calvarium is intact.  Orbital soft tissues are normal.  Paranasal sinuses and mastoid air cells are clear.  IMPRESSION: 1.  Age indeterminate hypodensity within the left periventricular white matter.  If there is clinical concern for possible acute/subacute ischemic infarct, further evaluation with MRI could be performed. 2.  No acute intracranial hemorrhage. 3.  Atrophy with chronic small vessel ischemic disease.   Original Report Authenticated By: Rise Mu, M.D.     Assessment: 67 y.o. male presenting with a 2 day h/o dizziness.  He reports no other associated neurological symptoms.  There is some mild right sided weakness noted on examination.  The patient has multiple vascular risk factors.  CT reviewed and shows an age indeterminate left periventricular area of hypodensity.  Patient now admitted to rule out stroke.  Stroke Risk Factors - diabetes mellitus, hyperlipidemia, hypertension and CAD  Plan: 1. HgbA1c, fasting lipid panel 2. MRI, MRA  of the brain without contrast 3. PT consult, OT consult, Speech consult 4. Echocardiogram 5. Carotid dopplers 6. Prophylactic therapy-Antiplatelet med: Plavix - dose 75mg  daily 7. Risk factor modification 8. Telemetry monitoring 9. Frequent neuro checks   Thana Farr, MD [facility] [phone] 10/31/2012, 6:33 AM

## 2012-10-31 NOTE — Progress Notes (Signed)
Subjective: Patient seen and examined, admitted this morning with dizziness. CT showed age indeterminate left periventricular area of hypodensity. Stroke work up ordered. Filed Vitals:   10/31/12 0529  BP: 156/88  Pulse: 100  Temp:   Resp:     Chest: Clear Bilaterally Heart : S1S2 RRR Abdomen: Soft, nontender Ext : No edema Neuro: Alert, oriented x 3  A/P ? CVA- Follow the imaging studies. Orthostatic hypotension AKI on CKD- Continue IV fluids. Hyperlipidemia- continue statin. Diabetes Mellitus- Continue SSI    Meredeth Ide [facility] [phone]

## 2012-10-31 NOTE — ED Notes (Signed)
Assigned to room 4N05 @ [facility], RN notified.

## 2012-10-31 NOTE — Evaluation (Signed)
Physical Therapy Evaluation Patient Details Name: [name] MRN: [id number] DOB: 01-30-1946 Today's Date: 10/31/2012 Time: 2536-6440 PT Time Calculation (min): 40 min  PT Assessment / Plan / Recommendation History of Present Illness  Patient is a 67 yo male admitted with dizziness that started in the middle of the night.  Patient reports feeling off balance with gait.  Also noted Rt extremity weakness, dysmetria.  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  Clinical Impression  Completed vestibular evaluation.  Noted nystagmus with left gaze.  Difficulty with smooth pursuits to left and up.  Otherwise oculomotor evaluation negative.  Performed modified Dix Hallpike to both sides - no dizziness or nystagmus noted.  Roll test negative.  No peripheral causes for vertigo identified. Remainder of assessment noted RUE/RLE weakness and decreased coordination, gait disturbance of veering to right, and slight decrease in balance with high level balance activities.  Patient will benefit from acute PT for balance therapy.    PT Assessment  Patient needs continued PT services    Follow Up Recommendations  Outpatient PT;Supervision - Intermittent    Does the patient have the potential to tolerate intense rehabilitation      Barriers to Discharge Decreased caregiver support Wife works.  Patient at home alone during day.    Equipment Recommendations  None recommended by PT    Recommendations for Other Services     Frequency Min 3X/week    Precautions / Restrictions Precautions Precautions: None Restrictions Weight Bearing Restrictions: No   Pertinent Vitals/Pain       Mobility  Bed Mobility Bed Mobility: Supine to Sit;Sit to Supine Supine to Sit: 7: Independent;HOB flat Sit to Supine: 7: Independent;HOB flat Details for Bed Mobility Assistance: No dizziness with transitions Transfers Transfers: Sit to Stand;Stand to Sit Sit to Stand: 5: Supervision;From  bed Stand to Sit: 5: Supervision;To bed Details for Transfer Assistance: No cues needed.  Supervision for safety only.  No dizziness with transiiton. Ambulation/Gait Ambulation/Gait Assistance: 5: Supervision Ambulation Distance (Feet): 250 Feet Assistive device: None Ambulation/Gait Assistance Details: Patient veering toward right side during gait.  No loss of balance with gait.  Did stagger with high level balance activities. Gait Pattern: Step-through pattern Stairs: Yes Stairs Assistance: 5: Supervision Stairs Assistance Details (indicate cue type and reason): Patient able to negotiate stairs without rails with good balance Stair Management Technique: No rails;Alternating pattern;Forwards Number of Stairs: 5 (x2) Modified Rankin (Stroke Patients Only) Pre-Morbid Rankin Score: No symptoms Modified Rankin: No significant disability    Exercises     PT Diagnosis: Abnormality of gait  PT Problem List: Decreased strength;Decreased balance;Decreased mobility;Decreased coordination PT Treatment Interventions: Gait training;Functional mobility training;Balance training;Patient/family education     PT Goals(Current goals can be found in the care plan section) Acute Rehab PT Goals Patient Stated Goal: To go home soon PT Goal Formulation: With patient/family Time For Goal Achievement: 11/07/12 Potential to Achieve Goals: Good  Visit Information  Last PT Received On: 10/31/12 Assistance Needed: +1 History of Present Illness: Patient is a 67 yo male admitted with dizziness that started in the middle of the night.  Patient reports feeling off balance with gait.  Also noted Rt extremity weakness, dysmetria.  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       Prior Functioning  Home Living Family/patient expects to be discharged to:: Private residence Living Arrangements: Spouse/significant other Available Help at Discharge: Family;Available PRN/intermittently (Wife  works) Type of Home:  House Home Access: Stairs to enter Secretary/administrator of Steps: 3 Entrance Stairs-Rails: Right;Left Home Layout: One level Home Equipment: Crutches Prior Function Level of Independence: Independent Communication Communication: No difficulties Dominant Hand: Right    Cognition  Cognition Arousal/Alertness: Awake/alert Behavior During Therapy: WFL for tasks assessed/performed Overall Cognitive Status: Within Functional Limits for tasks assessed    Extremity/Trunk Assessment Upper Extremity Assessment Upper Extremity Assessment: RUE deficits/detail RUE Deficits / Details: Strength 5-/5 RUE Coordination: decreased fine motor Lower Extremity Assessment Lower Extremity Assessment: RLE deficits/detail RLE Deficits / Details: Strength 4+/5 RLE Sensation: decreased light touch;history of peripheral neuropathy RLE Coordination: decreased gross motor Cervical / Trunk Assessment Cervical / Trunk Assessment: Normal   Balance Balance Balance Assessed: Yes Static Standing Balance Single Leg Stance - Right Leg: 22 Single Leg Stance - Left Leg: 30 Rhomberg - Eyes Opened: 30 Rhomberg - Eyes Closed: 30 (Slight increased sway) Standardized Balance Assessment Standardized Balance Assessment: Dynamic Gait Index Dynamic Gait Index Level Surface: Normal Change in Gait Speed: Mild Impairment Gait with Horizontal Head Turns: Mild Impairment Gait with Vertical Head Turns: Normal Gait and Pivot Turn: Normal Step Over Obstacle: Normal Step Around Obstacles: Normal Steps: Normal Total Score: 22  End of Session PT - End of Session Equipment Utilized During Treatment: Gait belt Activity Tolerance: Patient tolerated treatment well Patient left: in bed;with call Zaro/phone within reach;with bed alarm set;with family/visitor present Nurse Communication: Mobility status  GP     Vena Austria 10/31/2012, 3:20 PM Durenda Hurt. Renaldo Fiddler, [facility] Acute Rehab Services Pager  [phone]

## 2012-10-31 NOTE — Progress Notes (Signed)
Bilateral carotid artery duplex:  1-39% ICA stenosis.  Vertebral artery flow is antegrade.     

## 2012-10-31 NOTE — ED Provider Notes (Signed)
CSN: [id number]     Arrival date & time 10/31/12  0108 History     First MD Initiated Contact with Patient 10/31/12 0124     Chief Complaint  Patient presents with  . Hypertension   (Consider location/radiation/quality/duration/timing/severity/associated sxs/prior Treatment) HPI Comments: Patient presents from home with a 3 day history of dizziness which has been constant. It is described as both dizziness, vertigo or lightheadedness. It is worse with going from sitting to standing position. Worse with lying down. He denies any headache, nausea. He did have one episode of vomiting 2 days ago. No shortness of breath, abdominal pain or diarrhea. Difficulty breathing or swallowing. He reports intermittent chest pain since his hospitalization last month. He underwent catheterization that showed moderate CAD and is being medically managed. No chest pain at this time. He states compliance with medications. Homeless blood pressure earlier this evening was greater than 200 but is now normalized.  The history is provided by the patient.    Past Medical History  Diagnosis Date  . Hypertension   . High cholesterol   . GERD (gastroesophageal reflux disease)   . GI bleed     a. Recurrent GI bleed, tx with IV iron. b. Per heme notes - likely AVMs 04/2012 (tx with cauterization several months ago).  . Orthostatic hypotension     a. Tx with florinef.  . Hematuria     a. Urology note scan from 07/2012: cystoscopy without evidence for bladder lesion, only lateral hypertrophy of posterior urethra, bladder impression from BPH. b. Pt states he had "some tests" scheduled for later in July 2014.  . Stage III chronic kidney disease     a. Stage 3 (DM with complications ->CKD, peripheral neuropathy).  . Anemia, iron deficiency 03/24/2011    a. Recurrent GI bleed, tx with periodic iron infusions.  . 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 Sleep apnea     "had mask; couldn't sleep in it" (09/20/2012)  . Type II  diabetes mellitus     a. Dx 1994, uncontrolled.   . Diabetic peripheral neuropathy 2014    foot pain.  . Chronic lower back pain   . Fatty liver     on CT of 11/2010  . AVM (arteriovenous malformation) of duodenum, acquired     egds in 01/2012, 04/2011  . Polyp, colonic     Colonoscopy 01/2012 "benign" polyp  . CAD (coronary artery disease)     a. Cath 09/2012: moderate borderline CAD in mid LAD/small diagonal branch, mild RCA stenosis, to be managed medically   . BPH (benign prostatic hyperplasia)   . Prostate cancer    Past Surgical History  Procedure Laterality Date  . Shoulder open rotator cuff repair Left 1980's  . Inguinal hernia repair Right 2011  . Lumbar disc surgery  1980's   Family History  Problem Relation Age of Onset  . Heart attack Brother     Died at 72  . Stroke Father     Died at 73  . Diabetes Sister   . Diabetes Mother   . Stomach cancer Brother   . Heart attack Sister    History  Substance Use Topics  . Smoking status: Former Smoker -- 1.00 packs/day for 48 years    Types: Cigarettes    Quit date: 03/17/2012  . Smokeless tobacco: Never Used  . Alcohol Use: No     Comment: 09/20/2012 "Used to; stopped ~ 2009; never had problem w/it"    Review of Systems  Constitutional: Negative for fever, activity change and appetite change.  HENT: Negative for congestion and rhinorrhea.   Respiratory: Positive for chest tightness. Negative for cough and shortness of breath.   Cardiovascular: Negative for chest pain.  Gastrointestinal: Positive for nausea. Negative for vomiting, abdominal pain, diarrhea and constipation.  Genitourinary: Negative for dysuria, hematuria and testicular pain.  Musculoskeletal: Negative for back pain.  Skin: Negative for rash.  Neurological: Positive for dizziness, weakness and light-headedness. Negative for syncope and headaches.  A complete 10 system review of systems was obtained and all systems are negative except as noted in the  HPI and PMH.    Allergies  Review of patient's allergies indicates no known allergies.  Home Medications   Current Outpatient Rx  Name  Route  Sig  Dispense  Refill  . aspirin EC 81 MG EC tablet   Oral   Take 1 tablet (81 mg total) by mouth daily.         [name] Kitchen atorvastatin (LIPITOR) 80 MG tablet   Oral   Take 1 tablet (80 mg total) by mouth daily.   30 tablet   2   . carvedilol (COREG) 3.125 MG tablet   Oral   Take 1 tablet (3.125 mg total) by mouth 2 (two) times daily with a meal.   60 tablet   2   . carvedilol (COREG) 3.125 MG tablet               . COZAAR 50 MG tablet               . fludrocortisone (FLORINEF) 0.1 MG tablet   Oral   Take 1 tablet (0.1 mg total) by mouth daily.   30 tablet   2   . furosemide (LASIX) 20 MG tablet   Oral   Take 20 mg by mouth daily as needed for fluid.         [name] Kitchen gabapentin (NEURONTIN) 300 MG capsule   Oral   Take 1 capsule (300 mg total) by mouth 3 (three) times daily.   30 capsule   3   . gabapentin (NEURONTIN) 300 MG capsule               . HUMALOG 100 UNIT/ML SOCT               . Hydrocodone-APAP-Dietary Prod 7.5-750 MG MISC               . insulin glargine (LANTUS) 100 UNIT/ML injection   Subcutaneous   Inject 0.45 mLs (45 Units total) into the skin at bedtime.   5 pen   6   . insulin lispro (HUMALOG) 100 UNIT/ML injection   Subcutaneous   Inject 5-8 Units into the skin 3 (three) times daily before meals.         . Insulin Syringe-Needle U-100 (BD INSULIN SYRINGE ULTRAFINE) 31G X 5/16" 0.5 ML MISC      Use 3x a day as advised   100 each   1   . levofloxacin (LEVAQUIN) 500 MG tablet               . losartan (COZAAR) 50 MG tablet   Oral   Take 50 mg by mouth daily.         . metFORMIN (GLUCOPHAGE) 500 MG tablet   Oral   Take 1 tablet (500 mg total) by mouth 2 (two) times daily with a meal.           !!!!!!!!!!!!!!!!!!!!!!!!!!!!!!!!!!!!!!!!!!!!!!!!!! ...   [name] Kitchen  moxifloxacin  (VIGAMOX) 0.5 % ophthalmic solution   Both Eyes   Place 1 drop into both eyes 3 (three) times daily.         [name] Kitchen NEXIUM 40 MG capsule   Oral   Take 40 mg by mouth 2 (two) times daily.          [name] Kitchen NEXIUM 40 MG packet               . nitroGLYCERIN (NITROSTAT) 0.4 MG SL tablet   Sublingual   Place 1 tablet (0.4 mg total) under the tongue every 5 (five) minutes as needed for chest pain (up to 3 doses).   25 tablet   2     IMPORTANT: Do not take this medication if you have ...   . tamsulosin (FLOMAX) 0.4 MG CAPS capsule               . tamsulosin (FLOMAX) 0.4 MG CAPS   Oral   Take 0.4 mg by mouth daily after supper.         . vardenafil (LEVITRA) 10 MG tablet   Oral   Take 1 tablet (10 mg total) by mouth daily as needed for erectile dysfunction.           Do not take this medication if you have taken any  ...    BP 111/75  Pulse 123  Temp(Src) 97.8 F (36.6 C) (Oral)  Resp 18  SpO2 100% Physical Exam  Constitutional: He is oriented to person, place, and time. He appears well-developed and well-nourished. No distress.  HENT:  Head: Normocephalic and atraumatic.  Mouth/Throat: Oropharynx is clear and moist. No oropharyngeal exudate.  Eyes: Conjunctivae and EOM are normal. Pupils are equal, round, and reactive to light.  Neck: Normal range of motion. Neck supple.  Cardiovascular: Normal rate, regular rhythm and normal heart sounds.   Pulmonary/Chest: Effort normal and breath sounds normal. No respiratory distress.  Abdominal: Soft. There is no tenderness. There is no rebound and no guarding.  Musculoskeletal: Normal range of motion. He exhibits no edema and no tenderness.  Neurological: He is alert and oriented to person, place, and time. No cranial nerve deficit. He exhibits normal muscle tone. Coordination normal.  CN 2-12 intact, no ataxia on finger to nose, no nystagmus, 5/5 strength throughout, no pronator drift, Romberg negative, normal gait.   Skin: Skin  is warm.    ED Course   Procedures (including critical care time)  Labs Reviewed  CBC WITH DIFFERENTIAL - Abnormal; Notable for the following:    RBC 3.71 (*)    Hemoglobin 11.4 (*)    HCT 32.5 (*)    Monocytes Relative 18 (*)    All other components within normal limits  COMPREHENSIVE METABOLIC PANEL - Abnormal; Notable for the following:    Potassium 3.2 (*)    Glucose, Bld 212 (*)    BUN 25 (*)    Creatinine, Ser 1.70 (*)    Albumin 3.0 (*)    Total Bilirubin 0.2 (*)    GFR calc non Af Amer 41 (*)    GFR calc Af Amer 47 (*)    All other components within normal limits  TROPONIN I  URINALYSIS, ROUTINE W REFLEX MICROSCOPIC   Dg Chest 2 View  10/31/2012   *RADIOLOGY REPORT*  Clinical Data: Hypertension  CHEST - 2 VIEW  Comparison: Prior radiograph from 09/20/2012.  Findings: Cardiac and mediastinal silhouettes are stable in size and contour, and remain within normal limits.  The lungs are normally inflated.  No airspace consolidation, pleural effusion, pulmonary edema, or pneumothorax.  No acute osseous abnormality.  IMPRESSION: No acute cardiopulmonary process.   Original Report Authenticated By: Rise Mu, M.D.   Ct Head Wo Contrast  10/31/2012   *RADIOLOGY REPORT*  Clinical Data: Hypertension  CT HEAD WITHOUT CONTRAST  Technique:  Contiguous axial images were obtained from the base of the skull through the vertex without contrast.  Comparison: None.  Findings: There is no acute intracranial hemorrhage.  The CSF containing spaces are mildly prominent, consistent with atrophy. Ill-defined vague hypodensity is seen within the left periventricular white matter, which may represent acute/subacute ischemic infarct (series 2, image 22). Additional hypodensities within the periventricular white matter are most consistent with chronic small vessel ischemic disease.  There is no midline shift or mass lesion. No extra-axial fluid collection.  Calvarium is intact.  Orbital soft  tissues are normal.  Paranasal sinuses and mastoid air cells are clear.  IMPRESSION: 1.  Age indeterminate hypodensity within the left periventricular white matter.  If there is clinical concern for possible acute/subacute ischemic infarct, further evaluation with MRI could be performed. 2.  No acute intracranial hemorrhage. 3.  Atrophy with chronic small vessel ischemic disease.   Original Report Authenticated By: Rise Mu, M.D.   1. CVA (cerebral infarction)   2. AKI (acute kidney injury)   3. Orthostatic hypotension   4. Hypokalemia   5. Dizziness     MDM  3 day history of dizziness and lightheadedness it is worse with standing. Nonfocal neuro exam.  Orthostatics positive. Heart rate elevates to 123 with standing. Hx of orthostatic hypotension and currently on florinef. Patient wasn't familiar with med when asked about it.   September 20 2012 [facility]: Final Conclusions:  1. 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  2. Normal LV function.  Labs today with worsening renal function, Cr 1.7.  HR improved to 80s after fluids.  Dizziness persists.  CT scan with area of possibly acute infarct. Hypokalemia replaced. With risk factors, would benefit from observation and further assessment of possible CVA.   Date: 10/31/2012  Rate: 97  Rhythm: normal sinus rhythm  QRS Axis: normal  Intervals: normal  ST/T Wave abnormalities: normal  Conduction Disutrbances:none  Narrative Interpretation:   Old EKG Reviewed: unchanged    Glynn Octave, MD 10/31/12 [phone]

## 2012-10-31 NOTE — ED Notes (Signed)
[facility] called to transport pt to [facility].

## 2012-10-31 NOTE — Telephone Encounter (Signed)
Received a call from [facility] requesting transfer. 66 yr. Old male w/ hx GI bleed, Type 2 DM, CAD, diabetic neuropathy, HTN, tobacco abuse, with 3 day hx of dizziness, +orthostatic VS. He has a CT brain with a possible area of acute infarct. He has AKI as well. Will need tele bed. Accepted transfer.  Jonah Blue

## 2012-10-31 NOTE — Progress Notes (Signed)
MRI-DWI reviewed: there is suspicion of a punctate (1 mm) acute infarction in the left mid brain. Continue plavix and atorvastatin and await further recommendations from our stroke team. Wyatt Portela, MD [facility].

## 2012-10-31 NOTE — Evaluation (Signed)
Clinical/Bedside Swallow Evaluation Patient Details  Name: [name] MRN: [id number] Date of Birth: [birth year]  Today's Date: 10/31/2012 Time: 7829-5621 SLP Time Calculation (min): 15 min  Past Medical History:  Past Medical History  Diagnosis Date  . Hypertension   . High cholesterol   . GERD (gastroesophageal reflux disease)   . GI bleed     a. Recurrent GI bleed, tx with IV iron. b. Per heme notes - likely AVMs 04/2012 (tx with cauterization several months ago).  . Orthostatic hypotension     a. Tx with florinef.  . Hematuria     a. Urology note scan from 07/2012: cystoscopy without evidence for bladder lesion, only lateral hypertrophy of posterior urethra, bladder impression from BPH. b. Pt states he had "some tests" scheduled for later in July 2014.  . Stage III chronic kidney disease     a. Stage 3 (DM with complications ->CKD, peripheral neuropathy).  . Anemia, iron deficiency 03/24/2011    a. Recurrent GI bleed, tx with periodic iron infusions.  . 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 Sleep apnea     "had mask; couldn't sleep in it" (09/20/2012)  . Type II diabetes mellitus     a. Dx 1994, uncontrolled.   . Diabetic peripheral neuropathy 2014    foot pain.  . Chronic lower back pain   . Fatty liver     on CT of 11/2010  . AVM (arteriovenous malformation) of duodenum, acquired     egds in 01/2012, 04/2011  . Polyp, colonic     Colonoscopy 01/2012 "benign" polyp  . CAD (coronary artery disease)     a. Cath 09/2012: moderate borderline CAD in mid LAD/small diagonal branch, mild RCA stenosis, to be managed medically   . BPH (benign prostatic hyperplasia)   . Prostate cancer    Past Surgical History:  Past Surgical History  Procedure Laterality Date  . Shoulder open rotator cuff repair Left 1980's  . Inguinal hernia repair Right 2011  . Lumbar disc surgery  1980's   HPI:  : 66 yr. Old [name] w/ hx HTN, HL, CAD, IDDM type 2 not well controlled, GERD, hx GI bleed due to  AVM's, orthostatic hypotension on Florinef, BPH, microscopic hematuria, CKD stage 3 (Cr 1.1), hx prostate CA, iron deficiency anemia, presents with the above stated complaints. He states he was awoken from sleep on Friday morning with severe dizziness. He states the room was spinning around him. He did not get better with position changes.  He admits to emesis x 1. Denies any diarrhea. Denies any CP or SOB. Denies any weakness of his extremities or numbness in his extremities. BSE ordered per stroke protocol.     Assessment / Plan / Recommendation Clinical Impression  Oropharyngeal swallow functional for regular consistency and thin liquids. No outward clinical s/s of aspiration noted throughout evaluation.  ST to sign off as no dysphagia observed and education complete.      Aspiration Risk  None    Diet Recommendation Regular;Thin liquid   Liquid Administration via: Cup;Straw Medication Administration: Whole meds with liquid Supervision: Patient able to self feed    Other  Recommendations Oral Care Recommendations: Oral care BID                  Swallow Study Prior Functional Status    Regular consistency and thin liquids with no prior history of dysphagia     General Date of Onset: 10/30/12 HPI: : 66 yr. Old [name]  w/ hx HTN, HL, CAD, IDDM type 2 not well controlled, GERD, hx GI bleed due to AVM's, orthostatic hypotension on Florinef, BPH, microscopic hematuria, CKD stage 3 (Cr 1.1), hx prostate CA, iron deficiency anemia, presents with the above stated complaints. He states he was awoken from sleep on Friday morning with severe dizziness. He states the room was spinning around him. He did not get better with position changes.  He admits to emesis x 1. Denies any diarrhea. Denies any CP or SOB. Denies any weakness of his extremities or numbness in his extremities. Type of Study: Bedside swallow evaluation Diet Prior to this Study: Regular;Thin liquids Temperature Spikes Noted:  No Respiratory Status: Room air History of Recent Intubation: No Behavior/Cognition: Alert;Cooperative;Pleasant mood Oral Cavity - Dentition: Adequate natural dentition (partial upper and lower) Self-Feeding Abilities: Able to feed self Patient Positioning: Upright in bed Baseline Vocal Quality: Clear Volitional Cough: Strong Volitional Swallow: Able to elicit    Oral/Motor/Sensory Function Overall Oral Motor/Sensory Function: Appears within functional limits for tasks assessed   Ice Chips Ice chips: Within functional limits   Thin Liquid Thin Liquid: Within functional limits    Nectar Thick Nectar Thick Liquid: Not tested   Honey Thick Honey Thick Liquid: Not tested   Puree Puree: Within functional limits Presentation: Self Fed   Solid   GO    Solid: Within functional limits Presentation: Self Lorretta Harp MS, CCC-SLP [phone] [facility] 10/31/2012,2:22 PM

## 2012-10-31 NOTE — H&P (Signed)
[facility] ADMISSION H&P  Chief Complaint: Dizziness  HPI: 66 yr. 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 He states he was awoken from sleep on Friday morning with severe dizziness. He states the room was spinning around him. He did not get better with position changes.  He admits to emesis x 1. Denies any diarrhea. Denies any CP or SOB. Denies any weakness of his extremities or numbness in his extremities. He presented to [facility] ED for this complaint. There he told the ED physician that his dizziness ir both worse with lying down and standing up (?).  He was found to be orthostatic.  He was given IVF and repeat orthostatic improved, but he still feels dizzy.  He states at this time that the dizziness is not severe, but still present. In addition, he was found to have evidence of AKI on CKD 4 with a Cr of 1.7.  He was also hypokalemic.  A CT of the brain without contrast showed age indeterminate hypodensity within the left periventricular white matter, which may be evidence of a CVA. I have discussed the case with Dr. Thad Ranger of Neurology. At this time, starting Plavix and obtaining MRI brain.   Past Medical History  Diagnosis Date  . Hypertension   . High cholesterol   . GERD (gastroesophageal reflux disease)   . GI bleed     a. Recurrent GI bleed, tx with IV iron. b. Per heme notes - likely AVMs 04/2012 (tx with cauterization several months ago).  . Orthostatic hypotension     a. Tx with florinef.  . Hematuria     a. Urology note scan from 07/2012: cystoscopy without evidence for bladder lesion, only lateral hypertrophy of posterior urethra, bladder impression from BPH. b. Pt states he had "some tests" scheduled for later in July 2014.  . Stage III chronic kidney  disease     a. Stage 3 (DM with complications ->CKD, peripheral neuropathy).  . Anemia, iron deficiency 03/24/2011    a. Recurrent GI bleed, tx with periodic iron infusions.  . 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 Sleep apnea     "had mask; couldn't sleep in it" (09/20/2012)  . Type II diabetes mellitus     a. Dx 1994, uncontrolled.   . Diabetic peripheral neuropathy 2014    foot pain.  . Chronic lower back pain   . Fatty liver     on CT of 11/2010  . AVM (arteriovenous malformation) of duodenum, acquired     egds in 01/2012, 04/2011  . Polyp, colonic     Colonoscopy 01/2012 "benign" polyp  . CAD (coronary artery disease)     a. Cath 09/2012: moderate borderline CAD in mid LAD/small diagonal branch, mild RCA stenosis, to be managed medically   . BPH (benign prostatic hyperplasia)   . Prostate cancer     Past Surgical History  Procedure Laterality Date  . Shoulder open rotator cuff repair Left 1980's  . Inguinal hernia repair Right 2011  . Lumbar disc surgery  1980's    Family History  Problem Relation Age of Onset  . Heart attack Brother     Died at 11  . Stroke Father     Died at 2  . Diabetes Sister   . Diabetes  Mother   . Stomach cancer Brother   . Heart attack Sister    Social History:  reports that he quit smoking about 7 months ago. His smoking use included Cigarettes. He has a 48 pack-year smoking history. He has never used smokeless tobacco. He reports that he does not drink alcohol or use illicit drugs.  Allergies: No Known Allergies  Medications Prior to Admission  Medication Sig Dispense Refill  . aspirin EC 81 MG EC tablet Take 1 tablet (81 mg total) by mouth daily.      [name] Kitchen atorvastatin (LIPITOR) 80 MG tablet Take 1 tablet (80 mg total) by mouth daily.  30 tablet  2  . carvedilol (COREG) 3.125 MG tablet Take 1 tablet (3.125 mg total) by mouth 2 (two) times daily with a meal.  60 tablet  2  . carvedilol (COREG) 3.125 MG tablet       . COZAAR 50 MG tablet        . fludrocortisone (FLORINEF) 0.1 MG tablet Take 1 tablet (0.1 mg total) by mouth daily.  30 tablet  2  . furosemide (LASIX) 20 MG tablet Take 20 mg by mouth daily as needed for fluid.      [name] Kitchen gabapentin (NEURONTIN) 300 MG capsule Take 1 capsule (300 mg total) by mouth 3 (three) times daily.  30 capsule  3  . gabapentin (NEURONTIN) 300 MG capsule       . HUMALOG 100 UNIT/ML SOCT       . Hydrocodone-APAP-Dietary Prod 7.5-750 MG MISC       . insulin glargine (LANTUS) 100 UNIT/ML injection Inject 0.45 mLs (45 Units total) into the skin at bedtime.  5 pen  6  . insulin lispro (HUMALOG) 100 UNIT/ML injection Inject 5-8 Units into the skin 3 (three) times daily before meals.      . Insulin Syringe-Needle U-100 (BD INSULIN SYRINGE ULTRAFINE) 31G X 5/16" 0.5 ML MISC Use 3x a day as advised  100 each  1  . levofloxacin (LEVAQUIN) 500 MG tablet       . losartan (COZAAR) 50 MG tablet Take 50 mg by mouth daily.      . metFORMIN (GLUCOPHAGE) 500 MG tablet Take 1 tablet (500 mg total) by mouth 2 (two) times daily with a meal.      . moxifloxacin (VIGAMOX) 0.5 % ophthalmic solution Place 1 drop into both eyes 3 (three) times daily.      [name] Kitchen NEXIUM 40 MG capsule Take 40 mg by mouth 2 (two) times daily.       [name] Kitchen NEXIUM 40 MG packet       . nitroGLYCERIN (NITROSTAT) 0.4 MG SL tablet Place 1 tablet (0.4 mg total) under the tongue every 5 (five) minutes as needed for chest pain (up to 3 doses).  25 tablet  2  . tamsulosin (FLOMAX) 0.4 MG CAPS capsule       . tamsulosin (FLOMAX) 0.4 MG CAPS Take 0.4 mg by mouth daily after supper.      . vardenafil (LEVITRA) 10 MG tablet Take 1 tablet (10 mg total) by mouth daily as needed for erectile dysfunction.        Results for orders placed during the hospital encounter of 10/31/12 (from the past 48 hour(s))  CBC WITH DIFFERENTIAL     Status: Abnormal   Collection Time    10/31/12  2:14 AM      Result Value Range   WBC 5.1  4.0 - 10.5 K/uL   RBC  3.71 (*) 4.22 - 5.81 MIL/uL    Hemoglobin 11.4 (*) 13.0 - 17.0 g/dL   HCT 16.1 (*) 09.6 - 04.5 %   MCV 87.6  78.0 - 100.0 fL   MCH 30.7  26.0 - 34.0 pg   MCHC 35.1  30.0 - 36.0 g/dL   RDW 40.9  81.1 - 91.4 %   Platelets 237  150 - 400 K/uL   Comment: REPEATED TO VERIFY     SPECIMEN CHECKED FOR CLOTS   Neutrophils Relative % 57  43 - 77 %   Neutro Abs 2.9  1.7 - 7.7 K/uL   Lymphocytes Relative 22  12 - 46 %   Lymphs Abs 1.1  0.7 - 4.0 K/uL   Monocytes Relative 18 (*) 3 - 12 %   Monocytes Absolute 0.9  0.1 - 1.0 K/uL   Eosinophils Relative 4  0 - 5 %   Eosinophils Absolute 0.2  0.0 - 0.7 K/uL   Basophils Relative 0  0 - 1 %   Basophils Absolute 0.0  0.0 - 0.1 K/uL   WBC Morphology WHITE COUNT CONFIRMED ON SMEAR     Smear Review PLATELET COUNT CONFIRMED BY SMEAR     Comment: LARGE PLATELETS PRESENT  COMPREHENSIVE METABOLIC PANEL     Status: Abnormal   Collection Time    10/31/12  2:14 AM      Result Value Range   Sodium 140  135 - 145 mEq/L   Potassium 3.2 (*) 3.5 - 5.1 mEq/L   Chloride 100  96 - 112 mEq/L   CO2 30  19 - 32 mEq/L   Glucose, Bld 212 (*) 70 - 99 mg/dL   BUN 25 (*) 6 - 23 mg/dL   Creatinine, Ser 7.82 (*) 0.50 - 1.35 mg/dL   Calcium 9.9  8.4 - 95.6 mg/dL   Total Protein 6.8  6.0 - 8.3 g/dL   Albumin 3.0 (*) 3.5 - 5.2 g/dL   AST 15  0 - 37 U/L   ALT 15  0 - 53 U/L   Alkaline Phosphatase 106  39 - 117 U/L   Total Bilirubin 0.2 (*) 0.3 - 1.2 mg/dL   GFR calc non Af Amer 41 (*) >90 mL/min   GFR calc Af Amer 47 (*) >90 mL/min   Comment: (NOTE)     The eGFR has been calculated using the CKD EPI equation.     This calculation has not been validated in all clinical situations.     eGFR's persistently <90 mL/min signify possible Chronic Kidney     Disease.  TROPONIN I     Status: None   Collection Time    10/31/12  2:14 AM      Result Value Range   Troponin I <0.30  <0.30 ng/mL   Comment:            Due to the release kinetics of cTnI,     a negative result within the first hours     of  the onset of symptoms does not rule out     myocardial infarction with certainty.     If myocardial infarction is still suspected,     repeat the test at appropriate intervals.  URINALYSIS, ROUTINE W REFLEX MICROSCOPIC     Status: Abnormal   Collection Time    10/31/12  4:16 AM      Result Value Range   Color, Urine YELLOW  YELLOW   APPearance CLEAR  CLEAR   Specific  Gravity, Urine 1.013  1.005 - 1.030   pH 6.0  5.0 - 8.0   Glucose, UA 100 (*) NEGATIVE mg/dL   Hgb urine dipstick TRACE (*) NEGATIVE   Bilirubin Urine NEGATIVE  NEGATIVE   Ketones, ur NEGATIVE  NEGATIVE mg/dL   Protein, ur 782 (*) NEGATIVE mg/dL   Urobilinogen, UA 0.2  0.0 - 1.0 mg/dL   Nitrite NEGATIVE  NEGATIVE   Leukocytes, UA NEGATIVE  NEGATIVE  URINE MICROSCOPIC-ADD ON     Status: None   Collection Time    10/31/12  4:16 AM      Result Value Range   Squamous Epithelial / LPF RARE  RARE   WBC, UA 0-2  <3 WBC/hpf   RBC / HPF 0-2  <3 RBC/hpf   Bacteria, UA RARE  RARE   Dg Chest 2 View  10/31/2012   *RADIOLOGY REPORT*  Clinical Data: Hypertension  CHEST - 2 VIEW  Comparison: Prior radiograph from 09/20/2012.  Findings: Cardiac and mediastinal silhouettes are stable in size and contour, and remain within normal limits.  The lungs are normally inflated.  No airspace consolidation, pleural effusion, pulmonary edema, or pneumothorax.  No acute osseous abnormality.  IMPRESSION: No acute cardiopulmonary process.   Original Report Authenticated By: Rise Mu, M.D.   Ct Head Wo Contrast  10/31/2012   *RADIOLOGY REPORT*  Clinical Data: Hypertension  CT HEAD WITHOUT CONTRAST  Technique:  Contiguous axial images were obtained from the base of the skull through the vertex without contrast.  Comparison: None.  Findings: There is no acute intracranial hemorrhage.  The CSF containing spaces are mildly prominent, consistent with atrophy. Ill-defined vague hypodensity is seen within the left periventricular white matter, which  may represent acute/subacute ischemic infarct (series 2, image 22). Additional hypodensities within the periventricular white matter are most consistent with chronic small vessel ischemic disease.  There is no midline shift or mass lesion. No extra-axial fluid collection.  Calvarium is intact.  Orbital soft tissues are normal.  Paranasal sinuses and mastoid air cells are clear.  IMPRESSION: 1.  Age indeterminate hypodensity within the left periventricular white matter.  If there is clinical concern for possible acute/subacute ischemic infarct, further evaluation with MRI could be performed. 2.  No acute intracranial hemorrhage. 3.  Atrophy with chronic small vessel ischemic disease.   Original Report Authenticated By: Rise Mu, M.D.    Review of Systems  Constitutional: Negative for fever, chills and weight loss.    Blood pressure 136/74, pulse 84, temperature 97.8 F (36.6 C), temperature source Oral, resp. rate 18, SpO2 100.00%. Physical Exam  Constitutional: He is oriented to person, place, and time. He appears well-developed and well-nourished. No distress.  HENT:  Head: Normocephalic and atraumatic.  Eyes: Conjunctivae and EOM are normal. Pupils are equal, round, and reactive to light.  Neck: Neck supple. No tracheal deviation present. No thyromegaly present.  Cardiovascular: Normal rate, regular rhythm, normal heart sounds and intact distal pulses.  Exam reveals no gallop and no friction rub.   No murmur heard. Respiratory: Effort normal and breath sounds normal. No respiratory distress.  GI: Soft. Bowel sounds are normal. He exhibits no distension. There is no tenderness.  Musculoskeletal: Normal range of motion. He exhibits no edema and no tenderness.  Neurological: He is alert and oriented to person, place, and time. He has normal strength and normal reflexes. No cranial nerve deficit. Coordination normal. GCS eye subscore is 4. GCS verbal subscore is 5. GCS motor subscore is  6.  Reflex Scores:      Tricep reflexes are 2+ on the right side and 2+ on the left side.      Bicep reflexes are 2+ on the right side and 2+ on the left side.      Brachioradialis reflexes are 2+ on the right side and 2+ on the left side.      Patellar reflexes are 2+ on the right side and 2+ on the left side.      Achilles reflexes are 2+ on the right side and 2+ on the left side. Skin: Skin is warm. He is not diaphoretic.  Psychiatric: He has a normal mood and affect. His behavior is normal.     Assessment/Plan 66 yr. 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  1) Dizziness: At this time, since he was orthostatic, he will be continued on NS at 75 cc/hr.  Lasix will be held.  I will also hold all his BP meds except for Coreg, which will have holding parameters.  Three sets of cardiac enzymes will be ordered. His alpha blocker will also be held.  His history implies that he has dizziness in all positions, not just when sitting and standing, so this is not clearly due to orthostasis. A TTE will be ordered.  MRI of the brain will be ordered. I will stop ASA and start Plavix for now.  Neurology has been consulted. PT/OT/SLP ordered. Swallow screen ordered. 2) AKI on CKD 3: IVF. Likely due to volume depletion. 3) HL: Lipid panel. Continue statin. 4) IDDM type 2: Continue lantus. He was uncontrolled DM. ISS. 5) FEN: K has been replaced. Diabetic diet if passes swallow screen. 6) Proph: heparin. 7) Code: FULL  Jonah Blue 10/31/2012, 6:13 AM

## 2012-10-31 NOTE — ED Notes (Signed)
Patient here with reporting that his BP has been running very high since Friday with nausea and dizziness. Denies headache. Has taken extra dose without response

## 2012-11-01 ENCOUNTER — Telehealth: Payer: Self-pay | Admitting: Family

## 2012-11-01 LAB — COMPREHENSIVE METABOLIC PANEL
ALT: 13 U/L (ref 0–53)
AST: 19 U/L (ref 0–37)
Alkaline Phosphatase: 85 U/L (ref 39–117)
CO2: 28 mEq/L (ref 19–32)
Calcium: 8.8 mg/dL (ref 8.4–10.5)
Chloride: 103 mEq/L (ref 96–112)
GFR calc non Af Amer: 64 mL/min — ABNORMAL LOW (ref >90)
Potassium: 3.4 mEq/L — ABNORMAL LOW (ref 3.5–5.1)
Sodium: 139 mEq/L (ref 135–145)
Total Bilirubin: 0.3 mg/dL (ref 0.3–1.2)

## 2012-11-01 LAB — HEMOGLOBIN A1C
Hgb A1c MFr Bld: 9.5 % — ABNORMAL HIGH (ref <5.7)
Mean Plasma Glucose: 226 mg/dL — ABNORMAL HIGH (ref <117)

## 2012-11-01 LAB — GLUCOSE, CAPILLARY: Glucose-Capillary: 166 mg/dL — ABNORMAL HIGH (ref 70–99)

## 2012-11-01 LAB — CBC
Hemoglobin: 11.4 g/dL — ABNORMAL LOW (ref 13.0–17.0)
MCH: 30.5 pg (ref 26.0–34.0)
MCHC: 35.6 g/dL (ref 30.0–36.0)
RDW: 15 % (ref 11.5–15.5)

## 2012-11-01 MED ORDER — POTASSIUM CHLORIDE CRYS ER 20 MEQ PO TBCR
40.0000 meq | EXTENDED_RELEASE_TABLET | Freq: Once | ORAL | Status: AC
  Administered 2012-11-01: 40 meq via ORAL
  Filled 2012-11-01: qty 2

## 2012-11-01 MED ORDER — ASPIRIN-DIPYRIDAMOLE ER 25-200 MG PO CP12: 1.0000 | ORAL_CAPSULE | Freq: Two times a day (BID) | ORAL | Status: DC

## 2012-11-01 MED ORDER — ATORVASTATIN CALCIUM 80 MG PO TABS: 80.0000 mg | ORAL_TABLET | Freq: Every day | ORAL | Status: DC

## 2012-11-01 MED ORDER — ASPIRIN-DIPYRIDAMOLE ER 25-200 MG PO CP12
1.0000 | ORAL_CAPSULE | Freq: Every day | ORAL | Status: DC
  Filled 2012-11-01: qty 1

## 2012-11-01 MED ORDER — CARVEDILOL 3.125 MG PO TABS: 3.1250 mg | ORAL_TABLET | Freq: Two times a day (BID) | ORAL | Status: DC

## 2012-11-01 MED ORDER — ACETAMINOPHEN 325 MG PO TABS: 650.0000 mg | ORAL_TABLET | Freq: Every day | ORAL | Status: DC

## 2012-11-01 NOTE — Progress Notes (Signed)
Patient ready for discharge.Assessments remain unchange as at now

## 2012-11-01 NOTE — Progress Notes (Signed)
Occupational Therapy Discharge Patient Details Name: TRYGG MANTZ MRN: [id number] DOB: 09/24/1945 Today's Date: 11/01/2012 Time: 4132-4401 OT Time Calculation (min): 11 min  Patient discharged from OT services secondary to goals met and no further OT needs identified.  Please see latest therapy progress note for current level of functioning and progress toward goals.    Progress and discharge plan discussed with patient and/or caregiver: Patient/Caregiver agrees with plan  GO     Lucile Shutters Pager: [phone]  11/01/2012, 9:54 AM

## 2012-11-01 NOTE — Telephone Encounter (Signed)
Please call pt and arrange a 2 week post hospital follow up.

## 2012-11-01 NOTE — Progress Notes (Signed)
Stroke Team Progress Note  HISTORY [name] is a RH 67 y.o. male who reports that he went to bed normal on Thursday. He awakened in the middle of the night and was dizzy. He has been dizzy ever since. He is dizzy in all positions and reports that it is worse when he is lying down because he feels that the bed is rocking. He has been able to walk but feels off balance when walking. The patient reports some nausea and vomiting. There has been no asociated weakness, numbness , difficulty with speech or double vision.  Patient was not a TPA candidate secondary to delay in arrival. He was admitted for further evaluation and treatment.  SUBJECTIVE No family is at the bedside.  Overall he feels his condition is gradually improving. Reports the room is spinning, still spinning now, but improving.   OBJECTIVE Most recent Vital Signs: Filed Vitals:   10/31/12 1700 10/31/12 2100 11/01/12 0242 11/01/12 0644  BP: 143/82 158/84 151/74 155/78  Pulse: 90 78 84 81  Temp: 98.1 F (36.7 C) 98.3 F (36.8 C) 97.4 F (36.3 C) 98.1 F (36.7 C)  TempSrc: Oral Oral Oral Oral  Resp: 18 18 18 20   Height:      Weight:      SpO2: 98% 99% 97% 92%   CBG (last 3)   Recent Labs  10/31/12 1606 10/31/12 2154 11/01/12 0658  GLUCAP 377* 148* 146*    IV Fluid Intake:     MEDICATIONS  . atorvastatin  80 mg Oral Daily  . carvedilol  3.125 mg Oral BID WC  . dipyridamole-aspirin  1 capsule Oral QHS  . fludrocortisone  0.1 mg Oral Daily  . heparin  5,000 Units Subcutaneous Q8H  . insulin aspart  0-15 Units Subcutaneous TID WC  . insulin aspart  0-5 Units Subcutaneous QHS  . insulin glargine  45 Units Subcutaneous QHS  . pantoprazole  80 mg Oral Q1200  . sodium chloride  3 mL Intravenous Q12H   PRN:  acetaminophen, nitroGLYCERIN  Diet:  Carb Control thin liquids Activity:  Up with assistance DVT Prophylaxis:  Heparin 5000 units sq tid   CLINICALLY SIGNIFICANT STUDIES Basic Metabolic Panel:   Recent  Labs Lab 10/31/12 0214 10/31/12 0810 11/01/12 0604  NA 140  --  139  K 3.2*  --  3.4*  CL 100  --  103  CO2 30  --  28  GLUCOSE 212*  --  157*  BUN 25*  --  16  CREATININE 1.70* 1.47* 1.16  CALCIUM 9.9  --  8.8   Liver Function Tests:   Recent Labs Lab 10/31/12 0214 11/01/12 0604  AST 15 19  ALT 15 13  ALKPHOS 106 85  BILITOT 0.2* 0.3  PROT 6.8 6.4  ALBUMIN 3.0* 2.7*   CBC:  Recent Labs Lab 10/25/12 1156  10/31/12 0214 10/31/12 0810 11/01/12 0604  WBC 4.6  < > 5.1 4.7 4.1  NEUTROABS 3.0  --  2.9  --   --   HGB 12.0*  < > 11.4* 10.1* 11.4*  HCT 34.9*  < > 32.5* 28.0* 32.0*  MCV 90  < > 87.6 85.4 85.6  PLT 210  < > 237 229 253  < > = values in this interval not displayed. Coagulation: No results found for this basename: LABPROT, INR,  in the last 168 hours Cardiac Enzymes:   Recent Labs Lab 10/31/12 0810 10/31/12 1521 10/31/12 2009  TROPONINI <0.30 <0.30 <0.30  Urinalysis:   Recent Labs Lab 10/31/12 0416  COLORURINE YELLOW  LABSPEC 1.013  PHURINE 6.0  GLUCOSEU 100*  HGBUR TRACE*  BILIRUBINUR NEGATIVE  KETONESUR NEGATIVE  PROTEINUR 100*  UROBILINOGEN 0.2  NITRITE NEGATIVE  LEUKOCYTESUR NEGATIVE   Lipid Panel    Component Value Date/Time   CHOL 153 10/31/2012 0810   TRIG 203* 10/31/2012 0810   HDL 29* 10/31/2012 0810   CHOLHDL 5.3 10/31/2012 0810   VLDL 41* 10/31/2012 0810   LDLCALC 83 10/31/2012 0810   HgbA1C  Lab Results  Component Value Date   HGBA1C 9.5* 10/31/2012    Urine Drug Screen:   No results found for this basename: labopia,  cocainscrnur,  labbenz,  amphetmu,  thcu,  labbarb    Alcohol Level: No results found for this basename: ETH,  in the last 168 hours  Abd Xray 10/31/2012    Large amount of fecal matter in the colon.  Single gas filled loop of small intestine in the left abdomen.  This can be normal but can reflect a localized ileus.     CT of the brain  10/31/2012   1.  Age indeterminate hypodensity within the left  periventricular white matter.  If there is clinical concern for possible acute/subacute ischemic infarct, further evaluation with MRI could be performed. 2.  No acute intracranial hemorrhage. 3.  Atrophy with chronic small vessel ischemic disease.    MRI of the brain  10/31/2012    Suspicion of a punctate (1 mm) acute infarction in the left mid brain.  Extensive old small vessel ischemic changes throughout the brainstem, cerebellum and cerebral hemispheric white matter.  Low density in the left hemispheric white matter at CT questioned as possibly acute is old.      MRA of the brain  See TCD  2D Echocardiogram    Carotid Doppler  No evidence of hemodynamically significant internal carotid artery stenosis. Vertebral artery flow is antegrade.   CXR  10/31/2012   No acute cardiopulmonary process.     EKG  normal EKG, normal sinus rhythm.   Therapy Recommendations OP PT  Physical Exam   Pleasant middle aged male not in distress.Awake alert. Afebrile. Head is nontraumatic. Neck is supple without bruit. Hearing is normal. Cardiac exam no murmur or gallop. Lungs are clear to auscultation. Distal pulses are well felt. Neurological Exam ;  Awake  Alert oriented x 3. Normal speech and language.eye movements full without nystagmus.fundi were not visualized. Vision acuity and fields appear normal. Hearing is normal. Palatal movements are normal. Face symmetric. Tongue midline. Normal strength, tone, reflexes and coordination. Normal sensation. Gait deferred. ASSESSMENT [name] is a 67 y.o. male presenting with dizziness. 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  On aspirin 81 mg orally every day prior to admission. Now on clopidogrel 75 mg orally every day for secondary stroke prevention. Patient with resultant dizziness, that is improving but not resolved. Work up underway.   orthostatic hypotension in setting of hx of  Hypertension   Acute on chronic kidney disease, stage III Hyperlipidemia, LDL 83, on lipitor 20 PTA, now on lipitor 80, goal LDL < 100 (< 70 for diabetics) Diabetes, HgbA1c 9.5, goal < 7.0 GERD Recurrent GIB, likely AVMs, cauterized fall 2013 obstructive sleep apnea  CAD  Hospital day # 1  TREATMENT/PLAN  Change plavix to Aggrenox for secondary stroke prevention given hx of GIB/AVMs - plavix has higher risk specifically for  GIB than aspirin... Will start once a day at bedtime without aspirin bridge as we typically do. After 2 weeks, increase medication to bid. May use tylenol for headache - will given x 1 week prior to aggrenox dose.  TCD to look at vasculature. Dr. Pearlean Brownie will not be able to read until tomorrow, even if done today. He will followup results in his office.  Follow up 2D echo  OP PT recommended  No additional workup anticipated. Ok for discharge once above workup completed.  followup Dr. Pearlean Brownie in 2 months.  Annie Main, MSN, RN, ANVP-BC, ANP-BC, [facility] Pager: [phone] 11/01/2012 10:11 AM  I have personally obtained a history, examined the patient, evaluated imaging results, and formulated the assessment and plan of care. I agree with the above. Delia Heady, MD

## 2012-11-01 NOTE — Discharge Summary (Addendum)
Physician Discharge Summary  [name] HYQ:[id number] DOB: 10/06/1945 DOA: 10/31/2012  PCP: Lemont Fillers., NP  Admit date: 10/31/2012 Discharge date: 11/01/2012  Time spent: 50* minutes  Recommendations for Outpatient Follow-up:  1. Follow up Dr Pearlean Brownie in 2 months for follow up TCD doppler 2. PCP to follow the echo report  Discharge Diagnoses:  Principal Problem:   Dizziness and giddiness Active Problems:   Diabetes mellitus type 2, uncontrolled   Unspecified essential hypertension   AKI (acute kidney injury)   Hypokalemia   Orthostatic hypotension   Discharge Condition: Stable  Diet recommendation:  Diabetic diet  Filed Weights   10/31/12 0636  Weight: 73.9 kg (162 lb 14.7 oz)    History of present illness:  66 yr. Old [name] w/ hx HTN, HL, CAD, IDDM type 2 not well controlled, GERD, hx GI bleed due to AVM's, , BPH, microscopic hematuria, CKD stage 3 (Cr 1.1), hx prostate CA, iron deficiency anemia, presents with the above stated complaints. He states he was awoken from sleep on Friday morning with severe dizziness. He states the room was spinning around him. He did not get better with position changes. He admits to emesis x 1. Denies any diarrhea. Denies any CP or SOB. Denies any weakness of his extremities or numbness in his extremities.  He presented to [facility] ED for this complaint. There he told the ED physician that his dizziness ir both worse with lying down and standing up (?). He was found to be orthostatic. He was given IVF and repeat orthostatic improved, but he still feels dizzy. He states at this time that the dizziness is not severe, but still present.  In addition, he was found to have evidence of AKI on CKD 4 with a Cr of 1.7. He was also hypokalemic. A CT of the brain without contrast showed age indeterminate hypodensity within the left periventricular white matter, which may be evidence of a CVA.  I have discussed the case with Dr. Thad Ranger of Neurology.  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 2d echo done result  is pending at this time. PCP can follow the echo report. Also patient had a TCD done in the hospital which will be followed by Dr. Pearlean Brownie as outpatient. We'll get outpatient physical therapy.  Hypertension- continue Coreg and losartan  Diabetes mellitus-continue Lantus and sliding scale insulin with metformin  Hypokalemia- will replace potassium before discharge.  Hyperlipidemia-dose of Lipitor has been increased to 80 mg by mouth daily.     Procedures:  2-D echo: Results pending  Transcranial Doppler: Results pending  For the above can be followed by neurology and PCP  Consultations:    Discharge Exam: Filed Vitals:   11/01/12 1352  BP: 155/83  Pulse: 78  Temp: 97.8 F (36.6 C)  Resp: 18    General: *Appearing no acute distress Cardiovascular S1-S2 is regular Respiratory: Clear bilaterally  Discharge Instructions  Discharge Orders   Future Appointments Provider Department Dept Phone   11/03/2012 8:00 AM [facility] In[facility] 1 [facility] [phone]   NPO after midnight   11/03/2012 11:00 AM [facility] 1 [place] [facility] [phone]   12/16/2012 10:30 AM Peter M [place], MD [facility] Main [facility] [place]) [phone]   12/20/2012 8:30 AM [name] [facility] [phone]  12/20/2012 9:00 AM [name], MD [place] [facility] [phone]   12/20/2012 10:00 AM [facility] [place] [facility] [phone]   01/03/2013 9:30 AM Sandford Craze, NP [facility] [phone]   Future Orders Complete By Expires   Diet - low sodium heart healthy  As directed    Discharge instructions  As  directed    Comments:     Outpatient physical therapy   Increase activity slowly  As directed        Medication List         atorvastatin 80 MG tablet  Commonly known as:  LIPITOR  Take 1 tablet (80 mg total) by mouth daily.     carvedilol 3.125 MG tablet  Commonly known as:  COREG  Take 1 tablet (3.125 mg total) by mouth 2 (two) times daily with a meal.     dipyridamole-aspirin 200-25 MG per 12 hr capsule  Commonly known as:  AGGRENOX  Take 1 capsule by mouth 2 (two) times daily.     esomeprazole 40 MG capsule  Commonly known as:  NEXIUM  Take 40 mg by mouth 2 (two) times daily.     fluticasone 50 MCG/ACT nasal spray  Commonly known as:  FLONASE  Place 2 sprays into the nose daily.     gabapentin 300 MG capsule  Commonly known as:  NEURONTIN  Take 300 mg by mouth at bedtime.     insulin glargine 100 UNIT/ML injection  Commonly known as:  LANTUS  Inject 40 Units into the skin at bedtime.     insulin lispro 100 UNIT/ML injection  Commonly known as:  HUMALOG  Inject 5 Units into the skin 3 (three) times daily before meals.     Insulin Syringe-Needle U-100 31G X 5/16" 0.5 ML Misc  Commonly known as:  BD INSULIN SYRINGE ULTRAFINE  Use 3x a day as advised     losartan 100 MG tablet  Commonly known as:  COZAAR  Take 100 mg by mouth daily.     metFORMIN 500 MG tablet  Commonly known as:  GLUCOPHAGE  Take 1 tablet (500 mg total) by mouth 2 (two) times daily with a meal.     multivitamin with minerals Tabs tablet  Take 1 tablet by mouth daily.     nitroGLYCERIN 0.4 MG SL tablet  Commonly known as:  NITROSTAT  Place 1 tablet (0.4 mg total) under the tongue every 5 (five) minutes as needed for chest pain (up to 3 doses).     polyvinyl alcohol 1.4 % ophthalmic solution  Commonly known as:  LIQUIFILM TEARS  Place 1 drop into both eyes daily as needed (dry eyes).     tamsulosin 0.4 MG Caps capsule  Commonly known as:  FLOMAX  Take 0.4 mg by mouth daily after  supper.     Vitamin D 2000 UNITS Caps  Take 2,000 Units by mouth daily.       No Known Allergies     Follow-up Information   Follow up with Gates Rigg, MD In 2 months.   Specialties:  Neurology, Radiology   Contact information:   [address] [place] [address] [phone]        The results of significant diagnostics from this hospitalization (including imaging, microbiology, ancillary and laboratory) are listed below for reference.    Significant Diagnostic Studies: Dg Chest 2 View  10/31/2012   *RADIOLOGY REPORT*  Clinical Data: Hypertension  CHEST - 2 VIEW  Comparison: Prior radiograph from 09/20/2012.  Findings: Cardiac and mediastinal silhouettes are stable in size and contour, and remain within normal limits.  The lungs are normally inflated.  No airspace consolidation, pleural effusion, pulmonary edema, or pneumothorax.  No acute osseous abnormality.  IMPRESSION: No acute cardiopulmonary process.   Original Report Authenticated By: Rise Mu, M.D.   Ct Head Wo Contrast  10/31/2012   *RADIOLOGY REPORT*  Clinical Data: Hypertension  CT HEAD WITHOUT CONTRAST  Technique:  Contiguous axial images were obtained from the base of the skull through the vertex without contrast.  Comparison: None.  Findings: There is no acute intracranial hemorrhage.  The CSF containing spaces are mildly prominent, consistent with atrophy. Ill-defined vague hypodensity is seen within the left periventricular white matter, which may represent acute/subacute ischemic infarct (series 2, image 22). Additional hypodensities within the periventricular white matter are most consistent with chronic small vessel ischemic disease.  There is no midline shift or mass lesion. No extra-axial fluid collection.  Calvarium is intact.  Orbital soft tissues are normal.  Paranasal sinuses and mastoid air cells are clear.  IMPRESSION: 1.  Age indeterminate hypodensity within the left  periventricular white matter.  If there is clinical concern for possible acute/subacute ischemic infarct, further evaluation with MRI could be performed. 2.  No acute intracranial hemorrhage. 3.  Atrophy with chronic small vessel ischemic disease.   Original Report Authenticated By: Rise Mu, M.D.   [name] Contrast  10/31/2012   *RADIOLOGY REPORT*  Clinical Data: Acute onset of dizziness.  Abnormal head CT.  MRI HEAD WITHOUT CONTRAST  Technique:  Multiplanar, multiecho pulse sequences of the brain and surrounding structures were obtained according to standard protocol without intravenous contrast.  Comparison: Head CT same day  Findings: I think there is a punctate acute infarction to the left of midline in the mid brain.  No other acute infarction.  There are extensive chronic small vessel infarctions throughout the pons.  There are a few old small vessel cerebellar infarctions. The cerebral hemispheres show moderate chronic small vessel changes within the deep and subcortical white matter.  No cortical or large vessel territory infarction.  Low density in the left hemispheric white matter at CT scan is due to old ischemic change.  No mass lesion.  No acute hemorrhage.  A few of the old white matter infarctions are associated with hemosiderin deposition.  No hydrocephalus or extra-axial collection.  No pituitary mass.  No inflammatory sinus disease.  No skull or skull base lesion.  IMPRESSION: Suspicion of a punctate (1 mm) acute infarction in the left mid brain.  Extensive old small vessel ischemic changes throughout the brainstem, cerebellum and cerebral hemispheric white matter.  Low density in the left hemispheric white matter at CT questioned as possibly acute is old.   Original Report Authenticated By: Paulina Fusi, M.D.   Dg Abd 2 Views  10/31/2012   *RADIOLOGY REPORT*  Clinical Data: Right-sided abdominal pain.  Vomiting.  ABDOMEN - 2 VIEW  Comparison: 08/17/2012  Findings: There is a  gas filled loop of small intestine in the left abdomen.  As an isolated finding, this is not definitely abnormal. This could represent a localized ileus.  The patient does have a moderate amount of fecal matter in the colon.  There is not a widespread small bowel obstructive pattern.  No sign of free air. No worrisome calcifications or bony findings.  IMPRESSION: Large amount of fecal  matter in the colon.  Single gas filled loop of small intestine in the left abdomen.  This can be normal but can reflect a localized ileus.   Original Report Authenticated By: Paulina Fusi, M.D.    Microbiology: No results found for this or any previous visit (from the past 240 hour(s)).   Labs: Basic Metabolic Panel:  Recent Labs Lab 10/31/12 0214 10/31/12 0810 11/01/12 0604  NA 140  --  139  K 3.2*  --  3.4*  CL 100  --  103  CO2 30  --  28  GLUCOSE 212*  --  157*  BUN 25*  --  16  CREATININE 1.70* 1.47* 1.16  CALCIUM 9.9  --  8.8   Liver Function Tests:  Recent Labs Lab 10/31/12 0214 11/01/12 0604  AST 15 19  ALT 15 13  ALKPHOS 106 85  BILITOT 0.2* 0.3  PROT 6.8 6.4  ALBUMIN 3.0* 2.7*   No results found for this basename: LIPASE, AMYLASE,  in the last 168 hours No results found for this basename: AMMONIA,  in the last 168 hours CBC:  Recent Labs Lab 10/31/12 0214 10/31/12 0810 11/01/12 0604  WBC 5.1 4.7 4.1  NEUTROABS 2.9  --   --   HGB 11.4* 10.1* 11.4*  HCT 32.5* 28.0* 32.0*  MCV 87.6 85.4 85.6  PLT 237 229 253   Cardiac Enzymes:  Recent Labs Lab 10/31/12 0214 10/31/12 0810 10/31/12 1521 10/31/12 2009  TROPONINI <0.30 <0.30 <0.30 <0.30   BNP: BNP (last 3 results) No results found for this basename: PROBNP,  in the last 8760 hours CBG:  Recent Labs Lab 10/31/12 1130 10/31/12 1606 10/31/12 2154 11/01/12 0658 11/01/12 1132  GLUCAP 183* 377* 148* 146* 166*       Signed:  Juliene Kirsh S  [facility] 11/01/2012, 3:48 PM

## 2012-11-01 NOTE — Evaluation (Signed)
Occupational Therapy Evaluation Patient Details Name: [name] MRN: [id number] DOB: 1945/03/24 Today's Date: 11/01/2012 Time: 4098-1191 OT Time Calculation (min): 11 min  OT Assessment / Plan / Recommendation History of present illness Patient is a 67 yo male admitted with dizziness that started in the middle of the night.  Patient reports feeling off balance with gait.  Also noted Rt extremity weakness, dysmetria.  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   Clinical Impression   Patient evaluated by Occupational Therapy with no further acute OT needs identified. All education has been completed and the patient has no further questions. See below for any follow-up Occupational Therapy or equipment needs. OT to sign off. Thank you for referral.      OT Assessment  Patient does not need any further OT services    Follow Up Recommendations  No OT follow up    Barriers to Discharge      Equipment Recommendations  None recommended by OT    Recommendations for Other Services    Frequency       Precautions / Restrictions Precautions Precautions: None Restrictions Weight Bearing Restrictions: No   Pertinent Vitals/Pain No pain    ADL  Grooming: Wash/dry hands;Independent Where Assessed - Grooming: Unsupported standing Toilet Transfer: Community education officer Method: Sit to Barista: Regular height toilet Toileting - Clothing Manipulation and Hygiene: Independent Where Assessed - Toileting Clothing Manipulation and Hygiene: Sit to stand from 3-in-1 or toilet Equipment Used: Gait belt Transfers/Ambulation Related to ADLs: Pt demonstrates ability to ambulate tossing a ball with head turns. pt required the ball to toss horizontal with limited vertical distance to control successful catch. pt demonstrates hand control to catch ball without dropping it for > 200 ft ambulation. pt challenged with counting by 2s then by 5s and then  naming animals (letter D letter C ). Pt stopped counting at 18 by 2s and seemed to have difficulty after reaching 20. Pt then asked to count by 5s and counting to 100. Pt asked to name an animal with the letter D. pt provided example Dog. Pt unable to name one animal with letter d even with questioning cues. Pt names Cat and rat when asked to name letter C animals.  ADL Comments: Pt demonstrates near baseline independence at this time. Pt does demonstrate diffculty with higher cogntive challenges at naming animals tossing a ball and walking. Question education level prior to admission. 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     OT Diagnosis:    OT Problem List:   OT Treatment Interventions:     OT Goals(Current goals can be found in the care plan section) Acute Rehab OT Goals Patient Stated Goal: To go home soon  Visit Information  Last OT Received On: 11/01/12 Assistance Needed: +1 History of Present Illness: Patient is a 67 yo male admitted with dizziness that started in the middle of the night.  Patient reports feeling off balance with gait.  Also noted Rt extremity weakness, dysmetria.  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       Prior Functioning     Home Living Family/patient expects to be discharged to:: Private residence Living Arrangements: Spouse/significant other Available Help at Discharge: Family;Available PRN/intermittently Type of Home: House Home Access: Stairs to enter [employer] of Steps: 3 Entrance Stairs-Rails: Right;Left Home Layout: One level Home Equipment: Crutches Prior Function Level of Independence:  Independent Communication Communication: No difficulties Dominant Hand: Right         Vision/Perception Vision - History Baseline Vision: No visual deficits Patient Visual Report: No change from baseline Vision - Assessment Eye Alignment: Within Functional Limits   Cognition   Cognition Arousal/Alertness: Awake/alert Behavior During Therapy: WFL for tasks assessed/performed Overall Cognitive Status: Within Functional Limits for tasks assessed    Extremity/Trunk Assessment Upper Extremity Assessment Upper Extremity Assessment: Overall WFL for tasks assessed Lower Extremity Assessment Lower Extremity Assessment: Defer to PT evaluation Cervical / Trunk Assessment Cervical / Trunk Assessment: Normal     Mobility Bed Mobility Supine to Sit: 7: Independent Sit to Supine: 7: Independent Transfers Sit to Stand: 7: Independent Stand to Sit: 7: Independent     Exercise     Balance High Level Balance High Level Balance Activites: Turns;Sudden stops;Direction changes;Backward walking High Level Balance Comments: tossing a ball walking backwards, sudden stops, walking every other square on the floor to demonstrate single leg standing   End of Session OT - End of Session Equipment Utilized During Treatment: Gait belt Activity Tolerance: Patient tolerated treatment well Patient left: in bed;with call [place]/phone within reach  GO     Lucile Shutters 11/01/2012, 9:53 AM Pager: [phone]

## 2012-11-01 NOTE — Care Management Note (Signed)
    Page 1 of 1   11/01/2012     4:14:59 PM   CARE MANAGEMENT NOTE 11/01/2012  Patient:  [name], [name]   Account Number:  [id number]  Date Initiated:  11/01/2012  Documentation initiated by:  Elmer Bales  Subjective/Objective Assessment:   Patient admitted for CVA.     Action/Plan:   Will follow for discharge   Anticipated DC Date:  11/01/2012   Anticipated DC Plan:  HOME/SELF CARE      DC Planning Services  CM consult  OP Neuro Rehab      Choice offered to / List presented to:  C-1 Patient           Status of service:  Completed, signed off Medicare Important Message given?   (If response is "NO", the following Medicare IM given date fields will be blank) Date Medicare IM given:   Date Additional Medicare IM given:    Discharge Disposition:  HOME/SELF CARE  Per UR Regulation:  Reviewed for med. necessity/level of care/duration of stay  If discussed at Long Length of Stay Meetings, dates discussed:    Comments:  11/01/12 1600 Elmer Bales RN, MSN, CM- Met with patient to discuss outpatient neuro rehab. Pt is agreeable and information was faxed out.  Pt was given written information. Information added to the AVS.

## 2012-11-01 NOTE — Progress Notes (Signed)
Physical Therapy Treatment Patient Details Name: [name] MRN: [id number] DOB: [birth year] Today's Date: 11/01/2012 Time: 4098-1191 PT Time Calculation (min): 14 min  PT Assessment / Plan / Recommendation  History of Present Illness Patient is a 67 yo male admitted with dizziness that started in the middle of the night.  Patient reports feeling off balance with gait.  Also noted Rt extremity weakness, dysmetria.  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   PT Comments   Patient progressing well with ambulation this morning. Able to work on high balance activities without LOB. Patient states he feels back to baseline with no complaints of dizziness. Patient will be home with wife at DC. Encouraged ambulation with staff and family at this time. Will sign off on acute PT and continue to recommend OOPT for high level balance follow up  Follow Up Recommendations  Outpatient PT;Supervision - Intermittent     Does the patient have the potential to tolerate intense rehabilitation     Barriers to Discharge        Equipment Recommendations  None recommended by PT    Recommendations for Other Services    Frequency     Progress towards PT Goals Progress towards PT goals: Goals met/education completed, patient discharged from PT  Plan      Precautions / Restrictions Precautions Precautions: None   Pertinent Vitals/Pain Denied pain   Mobility  Bed Mobility Supine to Sit: 7: Independent Sit to Supine: 7: Independent Transfers Sit to Stand: 7: Independent Stand to Sit: 7: Independent Ambulation/Gait Ambulation/Gait Assistance: 6: Modified independent (Device/Increase time) Ambulation Distance (Feet): 600 Feet Assistive device: None Ambulation/Gait Assistance Details: Noted no veering this session with ambulation. Completed high level balance with gait with no increased stagerring except with tandem walking however no LOB Gait Pattern: Step-through pattern Stairs  Assistance: 5: Supervision Stairs Assistance Details (indicate cue type and reason): no rails with good balance Stair Management Technique: No rails;Alternating pattern;Forwards Number of Stairs: 12    Exercises     PT Diagnosis:    PT Problem List:   PT Treatment Interventions:     PT Goals (current goals can now be found in the care plan section)    Visit Information  Last PT Received On: 11/01/12 Assistance Needed: +1 History of Present Illness: Patient is a 67 yo male admitted with dizziness that started in the middle of the night.  Patient reports feeling off balance with gait.  Also noted Rt extremity weakness, dysmetria.  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    Subjective Data      Cognition  Cognition Arousal/Alertness: Awake/alert Behavior During Therapy: WFL for tasks assessed/performed Overall Cognitive Status: Within Functional Limits for tasks assessed    Balance  High Level Balance High Level Balance Activites: Side stepping;Braiding;Direction changes;Turns;Head turns High Level Balance Comments: no LOB or staggering  End of Session PT - End of Session Equipment Utilized During Treatment: Gait belt Activity Tolerance: Patient tolerated treatment well Patient left: in bed;with call [place]/phone within reach Nurse Communication: Mobility status   GP     Fredrich Birks 11/01/2012, 8:36 AM 11/01/2012 Fredrich Birks PTA [phone] pager [phone] office

## 2012-11-01 NOTE — Progress Notes (Signed)
  Echocardiogram 2D Echocardiogram has been performed.  [name] 11/01/2012, 11:33 AM

## 2012-11-01 NOTE — Progress Notes (Signed)
  Vascular Ultrasound Transcranial Doppler has been completed.   11/01/2012 3:30 PM Gertie Fey, RVT, RDCS, RDMS

## 2012-11-02 ENCOUNTER — Emergency Department (HOSPITAL_COMMUNITY)
Admission: EM | Admit: 2012-11-02 | Discharge: 2012-11-02 | Disposition: A | Discharge status: Home / Self Care | Payer: Medicare Other | Attending: Emergency Medicine | Admitting: Emergency Medicine

## 2012-11-02 ENCOUNTER — Telehealth: Payer: Self-pay | Admitting: Family

## 2012-11-02 ENCOUNTER — Encounter (HOSPITAL_COMMUNITY): Payer: Self-pay | Admitting: *Deleted

## 2012-11-02 ENCOUNTER — Emergency Department (HOSPITAL_COMMUNITY): Payer: Medicare Other

## 2012-11-02 DIAGNOSIS — I129 Hypertensive chronic kidney disease with stage 1 through stage 4 chronic kidney disease, or unspecified chronic kidney disease: Secondary | ICD-10-CM | POA: Insufficient documentation

## 2012-11-02 DIAGNOSIS — G8929 Other chronic pain: Secondary | ICD-10-CM | POA: Insufficient documentation

## 2012-11-02 DIAGNOSIS — Z860100 Personal history of colon polyps, unspecified: Secondary | ICD-10-CM | POA: Insufficient documentation

## 2012-11-02 DIAGNOSIS — Z8546 Personal history of malignant neoplasm of prostate: Secondary | ICD-10-CM | POA: Insufficient documentation

## 2012-11-02 DIAGNOSIS — Z8673 Personal history of transient ischemic attack (TIA), and cerebral infarction without residual deficits: Secondary | ICD-10-CM | POA: Insufficient documentation

## 2012-11-02 DIAGNOSIS — Z862 Personal history of diseases of the blood and blood-forming organs and certain disorders involving the immune mechanism: Secondary | ICD-10-CM | POA: Insufficient documentation

## 2012-11-02 DIAGNOSIS — E119 Type 2 diabetes mellitus without complications: Secondary | ICD-10-CM | POA: Insufficient documentation

## 2012-11-02 DIAGNOSIS — Z87891 Personal history of nicotine dependence: Secondary | ICD-10-CM | POA: Insufficient documentation

## 2012-11-02 DIAGNOSIS — R9431 Abnormal electrocardiogram [ECG] [EKG]: Secondary | ICD-10-CM

## 2012-11-02 DIAGNOSIS — Z794 Long term (current) use of insulin: Secondary | ICD-10-CM | POA: Insufficient documentation

## 2012-11-02 DIAGNOSIS — Z79899 Other long term (current) drug therapy: Secondary | ICD-10-CM | POA: Insufficient documentation

## 2012-11-02 DIAGNOSIS — E7800 Pure hypercholesterolemia, unspecified: Secondary | ICD-10-CM | POA: Insufficient documentation

## 2012-11-02 DIAGNOSIS — IMO0002 Reserved for concepts with insufficient information to code with codable children: Secondary | ICD-10-CM | POA: Insufficient documentation

## 2012-11-02 DIAGNOSIS — R52 Pain, unspecified: Secondary | ICD-10-CM | POA: Insufficient documentation

## 2012-11-02 DIAGNOSIS — I2510 Atherosclerotic heart disease of native coronary artery without angina pectoris: Secondary | ICD-10-CM | POA: Insufficient documentation

## 2012-11-02 DIAGNOSIS — R51 Headache: Secondary | ICD-10-CM | POA: Insufficient documentation

## 2012-11-02 DIAGNOSIS — N183 Chronic kidney disease, stage 3 unspecified: Secondary | ICD-10-CM | POA: Insufficient documentation

## 2012-11-02 DIAGNOSIS — Z8719 Personal history of other diseases of the digestive system: Secondary | ICD-10-CM | POA: Insufficient documentation

## 2012-11-02 DIAGNOSIS — K219 Gastro-esophageal reflux disease without esophagitis: Secondary | ICD-10-CM | POA: Insufficient documentation

## 2012-11-02 DIAGNOSIS — Z8639 Personal history of other endocrine, nutritional and metabolic disease: Secondary | ICD-10-CM | POA: Insufficient documentation

## 2012-11-02 DIAGNOSIS — R42 Dizziness and giddiness: Secondary | ICD-10-CM | POA: Insufficient documentation

## 2012-11-02 HISTORY — DX: Cerebral infarction, unspecified: I63.9

## 2012-11-02 LAB — POCT I-STAT, CHEM 8
BUN: 12 mg/dL (ref 6–23)
Calcium, Ion: 1.19 mmol/L (ref 1.13–1.30)
Chloride: 105 mEq/L (ref 96–112)
HCT: 32 % — ABNORMAL LOW (ref 39.0–52.0)
Sodium: 143 mEq/L (ref 135–145)

## 2012-11-02 LAB — POCT I-STAT TROPONIN I: Troponin i, poc: 0 ng/mL (ref 0.00–0.08)

## 2012-11-02 MED ORDER — HYDROCODONE-ACETAMINOPHEN 5-325 MG PO TABS
1.0000 | ORAL_TABLET | Freq: Once | ORAL | Status: AC
  Administered 2012-11-02: 1 via ORAL
  Filled 2012-11-02: qty 1

## 2012-11-02 NOTE — Telephone Encounter (Signed)
Patient scheduled appointment for 11/16/12

## 2012-11-02 NOTE — ED Notes (Signed)
Pt states just discharge yesterday with stroke.  Went home and felt fine.  Woke up fine today and then at 0900 started having headache and dizziness.  Pt reports has also had right  Upper abdominal pain since he was in the hospital.

## 2012-11-02 NOTE — ED Notes (Signed)
MD Effie Shy at bedside evaluating for Code Stroke

## 2012-11-02 NOTE — Telephone Encounter (Signed)
Caller: Loretta/Spouse; Phone: [phone]; Reason for Call: Wife calling to inform patient is in ER at [facility] and was just taken back.

## 2012-11-02 NOTE — ED Notes (Signed)
Pt ambulates with no noted difficulty. Reports continued dizziness but able to walk in straight line. No assist needed.

## 2012-11-02 NOTE — Telephone Encounter (Signed)
Please call pt and arrange an ED follow up in 1-2 weeks.

## 2012-11-02 NOTE — ED Provider Notes (Signed)
CSN: [id number]     Arrival date & time 11/02/12  1124 History     First MD Initiated Contact with Patient 11/02/12 1203     Chief Complaint  Patient presents with  . Dizziness  . Headache  . side pain    (Consider location/radiation/quality/duration/timing/severity/associated sxs/prior Treatment) HPI Comments: [name] is a 67 y.o. Male who presents for evaluation of dizziness. The dizziness started at 9 AM. The dizziness is recurrent. He has had the dizziness on and off for 3 days. He cannot specify what the dizziness feels like. He also has a headache. He was discharged yesterday from the hospital, after being evaluated for dizziness, and diagnosed with a stroke. He has started. The medicines prescribed after diagnosis. He plans on seeing his PCP, for a followup appointment in 2 or 3 weeks. He denies fever, chills, cough, shortness of breath, chest pain, nausea or vomiting. He also has right-sided abdominal pain that is persistent for 2 days. He, states that he talk to the doctors. He states that he talked to the doctors about it during the admission. He's been eating well. There are no other known modifying factors.  Patient is a 67 y.o. male presenting with headaches. The history is provided by the patient.  Headache   Past Medical History  Diagnosis Date  . Hypertension   . High cholesterol   . GERD (gastroesophageal reflux disease)   . GI bleed     a. Recurrent GI bleed, tx with IV iron. b. Per heme notes - likely AVMs 04/2012 (tx with cauterization several months ago).  . Orthostatic hypotension     a. Tx with florinef.  . Hematuria     a. Urology note scan from 07/2012: cystoscopy without evidence for bladder lesion, only lateral hypertrophy of posterior urethra, bladder impression from BPH. b. Pt states he had "some tests" scheduled for later in July 2014.  . Stage III chronic kidney disease     a. Stage 3 (DM with complications ->CKD, peripheral neuropathy).  . Anemia,  iron deficiency 03/24/2011    a. Recurrent GI bleed, tx with periodic iron infusions.  . 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 Sleep apnea     "had mask; couldn't sleep in it" (09/20/2012)  . Type II diabetes mellitus     a. Dx 1994, uncontrolled.   . Diabetic peripheral neuropathy 2014    foot pain.  . Chronic lower back pain   . Fatty liver     on CT of 11/2010  . AVM (arteriovenous malformation) of duodenum, acquired     egds in 01/2012, 04/2011  . Polyp, colonic     Colonoscopy 01/2012 "benign" polyp  . CAD (coronary artery disease)     a. Cath 09/2012: moderate borderline CAD in mid LAD/small diagonal branch, mild RCA stenosis, to be managed medically   . BPH (benign prostatic hyperplasia)   . Prostate cancer   . Stroke    Past Surgical History  Procedure Laterality Date  . Shoulder open rotator cuff repair Left 1980's  . Inguinal hernia repair Right 2011  . Lumbar disc surgery  1980's   Family History  Problem Relation Age of Onset  . Heart attack Brother     Died at 48  . Stroke Father     Died at 85  . Diabetes Sister   . Diabetes Mother   . Stomach cancer Brother   . Heart attack Sister    History  Substance Use Topics  .  Smoking status: Former Smoker -- 1.00 packs/day for 48 years    Types: Cigarettes    Quit date: 03/17/2012  . Smokeless tobacco: Never Used  . Alcohol Use: No     Comment: 09/20/2012 "Used to; stopped ~ 2009; never had problem w/it"    Review of Systems  Neurological: Positive for headaches.  All other systems reviewed and are negative.    Allergies  Review of patient's allergies indicates no known allergies.  Home Medications   Current Outpatient Rx  Name  Route  Sig  Dispense  Refill  . atorvastatin (LIPITOR) 80 MG tablet   Oral   Take 1 tablet (80 mg total) by mouth daily.   30 tablet   2   . carvedilol (COREG) 3.125 MG tablet   Oral   Take 1 tablet (3.125 mg total) by mouth 2 (two) times daily with a meal.   30 tablet   2    . Cholecalciferol (VITAMIN D) 2000 UNITS CAPS   Oral   Take 2,000 Units by mouth daily.         [name] Kitchen dipyridamole-aspirin (AGGRENOX) 200-25 MG per 12 hr capsule   Oral   Take 1 capsule by mouth 2 (two) times daily.   60 capsule   2     Take  1 capsule daily x 1 week then change to twic ...   . esomeprazole (NEXIUM) 40 MG capsule   Oral   Take 40 mg by mouth 2 (two) times daily.         . fluticasone (FLONASE) 50 MCG/ACT nasal spray   Nasal   Place 2 sprays into the nose daily.         [name] Kitchen gabapentin (NEURONTIN) 300 MG capsule   Oral   Take 300 mg by mouth at bedtime.         . insulin glargine (LANTUS) 100 UNIT/ML injection   Subcutaneous   Inject 40 Units into the skin at bedtime.         . insulin lispro (HUMALOG) 100 UNIT/ML injection   Subcutaneous   Inject 5 Units into the skin 3 (three) times daily before meals.         . Insulin Syringe-Needle U-100 (BD INSULIN SYRINGE ULTRAFINE) 31G X 5/16" 0.5 ML MISC      Use 3x a day as advised   100 each   1   . losartan (COZAAR) 100 MG tablet   Oral   Take 100 mg by mouth daily.         . metFORMIN (GLUCOPHAGE) 500 MG tablet   Oral   Take 1 tablet (500 mg total) by mouth 2 (two) times daily with a meal.           !!!!!!!!!!!!!!!!!!!!!!!!!!!!!!!!!!!!!!!!!!!!!!!!!! ...   . Multiple Vitamin (MULTIVITAMIN WITH MINERALS) TABS tablet   Oral   Take 1 tablet by mouth daily.         . polyvinyl alcohol (LIQUIFILM TEARS) 1.4 % ophthalmic solution   Both Eyes   Place 1 drop into both eyes daily as needed (dry eyes).         . tamsulosin (FLOMAX) 0.4 MG CAPS   Oral   Take 0.4 mg by mouth daily after supper.         . nitroGLYCERIN (NITROSTAT) 0.4 MG SL tablet   Sublingual   Place 1 tablet (0.4 mg total) under the tongue every 5 (five) minutes as needed for chest pain (up to 3 doses).  25 tablet   2     IMPORTANT: Do not take this medication if you have ...    BP 132/66  Pulse 81  Temp(Src)  98.2 F (36.8 C)  Resp 18  Ht 5\' 7"  (1.702 m)  Wt 163 lb (73.936 kg)  BMI 25.52 kg/m2  SpO2 98% Physical Exam  Nursing note and vitals reviewed. Constitutional: He is oriented to person, place, and time. He appears well-developed and well-nourished.  HENT:  Head: Normocephalic and atraumatic.  Right Ear: External ear normal.  Left Ear: External ear normal.  Eyes: Conjunctivae and EOM are normal. Pupils are equal, round, and reactive to light.  Neck: Normal range of motion and phonation normal. Neck supple.  Cardiovascular: Normal rate, regular rhythm, normal heart sounds and intact distal pulses.   Pulmonary/Chest: Effort normal and breath sounds normal. He exhibits no bony tenderness.  Abdominal: Soft. Normal appearance. There is no tenderness.  Musculoskeletal: Normal range of motion.  Neurological: He is alert and oriented to person, place, and time. He has normal strength. No cranial nerve deficit or sensory deficit. He exhibits normal muscle tone. Coordination normal.  Nystagmus is present, bidirectional.  Skin: Skin is warm, dry and intact.  Psychiatric: He has a normal mood and affect. His behavior is normal. Judgment and thought content normal.    ED Course   Procedures (including critical care time) Patient Vitals for the past 24 hrs:  BP Temp Temp src Pulse Resp SpO2 Height Weight  11/02/12 1503 132/66 mmHg - - 81 18 98 % - -  11/02/12 1253 122/73 mmHg - - 78 16 97 % - -  11/02/12 1215 - 98.2 F (36.8 C) - - - - - -  11/02/12 1145 127/66 mmHg - Oral 92 18 97 % 5\' 7"  (1.702 m) 163 lb (73.936 kg)    1233 PM-Consult complete with Dr Cyril Mourning. Patient case explained and discussed. He sggests repeat MR only, looking for extension of CVA. Call ended at 1240  4:24 PM Reevaluation with update and discussion. After initial assessment and treatment, an updated evaluation reveals he, states that he feels better. He feels hungry. Julene Rahn L     Date: 11/02/12  Rate: 83   Rhythm: normal sinus rhythm  QRS Axis: left  PR and QT Intervals: normal  ST/T Wave abnormalities: nonspecific T wave changes  PR and QRS Conduction Disutrbances:none  Narrative Interpretation:   Old EKG Reviewed: changes noted- T wave abnormality new    Labs Reviewed  POCT I-STAT, CHEM 8 - Abnormal; Notable for the following:    Creatinine, Ser 1.40 (*)    Glucose, Bld 130 (*)    Hemoglobin 10.9 (*)    HCT 32.0 (*)    All other components within normal limits  POCT I-STAT TROPONIN I   [name] Contrast  11/02/2012   *RADIOLOGY REPORT*  Clinical Data: Dizziness.  Headache.  MRI HEAD WITHOUT CONTRAST  Technique:  Multiplanar, multiecho pulse sequences of the brain and surrounding structures were obtained according to standard protocol without intravenous contrast.  Comparison: MRI brain knee 10/31/2012.  Findings: The previously noted punctate diffusion signal abnormality in the left mid brain has a resolved.  Diffusion signal hyperintensity along the corpus callosum or cingulate gyrus adjacent to the left lateral ventricle on image 21 likely represents T2 shine through without an ADC correlate.  Extensive periventricular and subcortical white matter changes are otherwise similar to the prior study.  White matter changes extend into the brainstem.  A punctate remote lacunar infarct of the left cerebellum is again noted.  There are remote lacunar infarcts of the basal ganglia bilaterally, left greater than right.  Flow is present in the major intracranial arteries.  The globes and orbits are intact.  Mild to mucosal thickening is present in the ethmoid air cells and inferior frontal lobes bilaterally.  The mastoid air cells are clear.  IMPRESSION:  1.  Resolution of previously seen punctate infarct within the cerebellum. 2.  Multiple remote lacunar infarcts of the basal ganglia and left cerebellum are stable. 3.  Extensive white matter disease is similar to the prior study. This likely reflects  the sequelae of chronic microvascular ischemia. 4.  Minimal sinus disease.   Original Report Authenticated By: Marin Roberts, M.D.   Dg Chest Port 1 View  11/02/2012   *RADIOLOGY REPORT*  Clinical Data: Dizziness, hypertension  PORTABLE CHEST - 1 VIEW  Comparison: 10/31/2012  Findings: The cardiac shadow is stable.  The lungs are clear bilaterally.  No acute bony abnormality is seen.  IMPRESSION: No acute abnormality noted.   Original Report Authenticated By: Alcide Clever, M.D.   1. Dizziness   2. Abnormal EKG     MDM  Dizziness, nonspecific, with recent CVA. ED evaluation today is negative for acute CVA or extension of prior CVA. In fact, the abnormal midbrain lesion that was seen on the recent MR, is not visualized today. Patient had incidental finding of abnormal EKG with nonspecific T wave changes. He did not have any chest pain in the ED. His evaluation for acute cardiopulmonary abnormalities were negative.Doubt metabolic instability, serious bacterial infection or impending vascular collapse; the patient is stable for discharge.   Nursing Notes Reviewed/ Care Coordinated, and agree without changes. Applicable Imaging Reviewed.  Interpretation of Laboratory Data incorporated into ED treatment   Plan: Home Medications- usual; Home Treatments and Observation- rest, watch for progressive symptoms; return here if the recommended treatment, does not improve the symptoms; Recommended follow up- to followup with PCP in one or 2 weeks to discuss ongoing management. Also, he is to discuss the abnormal EKG with his PCP.      Flint Melter, MD 11/02/12 [phone]

## 2012-11-02 NOTE — Telephone Encounter (Signed)
Noted  

## 2012-11-02 NOTE — Telephone Encounter (Signed)
FYI

## 2012-11-02 NOTE — Telephone Encounter (Signed)
Left detailed message informing patient to call our office to schedule a post hospital follow up

## 2012-11-02 NOTE — ED Notes (Signed)
Pt reports waking at 0730 this AM and laying in bed. At 0900 pt reports sudden onset of dizziness that continues. Pt able to ambulate. Denies blurred vision/lightheadedness. Pt also reports 5/10 throbbing HA that started at 1030 this AM that is located in right occipital lobe and left eye.

## 2012-11-03 ENCOUNTER — Ambulatory Visit (HOSPITAL_COMMUNITY)
Admission: RE | Admit: 2012-11-03 | Discharge: 2012-11-03 | Disposition: A | Discharge status: Home / Self Care | Payer: Medicare Other | Source: Ambulatory Visit | Attending: Hematology & Oncology | Admitting: Hematology & Oncology

## 2012-11-03 ENCOUNTER — Encounter (HOSPITAL_COMMUNITY)
Admission: RE | Admit: 2012-11-03 | Discharge: 2012-11-03 | Disposition: A | Discharge status: Home / Self Care | Payer: Medicare Other | Source: Ambulatory Visit | Attending: Hematology & Oncology | Admitting: Hematology & Oncology

## 2012-11-03 DIAGNOSIS — C61 Malignant neoplasm of prostate: Secondary | ICD-10-CM

## 2012-11-03 DIAGNOSIS — R911 Solitary pulmonary nodule: Secondary | ICD-10-CM | POA: Insufficient documentation

## 2012-11-03 DIAGNOSIS — N62 Hypertrophy of breast: Secondary | ICD-10-CM | POA: Insufficient documentation

## 2012-11-03 DIAGNOSIS — M47814 Spondylosis without myelopathy or radiculopathy, thoracic region: Secondary | ICD-10-CM | POA: Insufficient documentation

## 2012-11-03 MED ORDER — TECHNETIUM TC 99M MEDRONATE IV KIT
25.2000 | PACK | Freq: Once | INTRAVENOUS | Status: AC | PRN
  Administered 2012-11-03: 25.2 via INTRAVENOUS

## 2012-11-03 MED ORDER — IOHEXOL 300 MG/ML  SOLN
100.0000 mL | Freq: Once | INTRAMUSCULAR | Status: AC | PRN
  Administered 2012-11-03: 100 mL via INTRAVENOUS

## 2012-11-03 NOTE — Telephone Encounter (Signed)
Patient is already scheduled for an ED follow up. Scheduled for 11/16/12.

## 2012-11-05 ENCOUNTER — Telehealth: Payer: Self-pay | Admitting: *Deleted

## 2012-11-05 ENCOUNTER — Telehealth: Payer: Self-pay | Admitting: Neurology

## 2012-11-05 NOTE — Telephone Encounter (Signed)
Called patient to let him know that his CT scan was normal per dr. Myna Hidalgo

## 2012-11-05 NOTE — Telephone Encounter (Signed)
Message copied by Anselm Jungling on Fri Nov 05, 2012 12:04 PM ------      Message from: Arlan Organ R      Created: Thu Nov 04, 2012  6:21 PM       Call - ct scan is normal. Cindee Lame ------

## 2012-11-11 ENCOUNTER — Other Ambulatory Visit: Payer: Self-pay | Admitting: Urology

## 2012-11-16 ENCOUNTER — Ambulatory Visit (INDEPENDENT_AMBULATORY_CARE_PROVIDER_SITE_OTHER): Payer: Medicare Other | Admitting: Family

## 2012-11-16 ENCOUNTER — Encounter: Payer: Self-pay | Admitting: Family

## 2012-11-16 VITALS — BP 124/60 | HR 100 | Temp 98.3°F | Wt 163.0 lb

## 2012-11-16 DIAGNOSIS — I639 Cerebral infarction, unspecified: Secondary | ICD-10-CM

## 2012-11-16 DIAGNOSIS — E1165 Type 2 diabetes mellitus with hyperglycemia: Secondary | ICD-10-CM

## 2012-11-16 DIAGNOSIS — N289 Disorder of kidney and ureter, unspecified: Secondary | ICD-10-CM

## 2012-11-16 DIAGNOSIS — D631 Anemia in chronic kidney disease: Secondary | ICD-10-CM

## 2012-11-16 DIAGNOSIS — I6350 Cerebral infarction due to unspecified occlusion or stenosis of unspecified cerebral artery: Secondary | ICD-10-CM

## 2012-11-16 LAB — BASIC METABOLIC PANEL
BUN: 15 mg/dL (ref 6–23)
Creat: 1.22 mg/dL (ref 0.50–1.35)
Potassium: 4.2 mEq/L (ref 3.5–5.3)

## 2012-11-16 NOTE — Progress Notes (Signed)
Subjective:    Patient ID: [name], male    DOB: April 17, 1945, 67 y.o.   MRN: [id number]  HPI  Mr. Reznick is a 67 yr old male who presents today for hospital follow up.  He was hospitalized 8/17-8/18 with dizziness.  He presented to the ED at the [facility] with dizziness. He was noted to be orthostatic and was hydrated.  His creatinine was elevated at 1.7.  He was also not to be hypokalemic at that time.  CT head performed in the ED noted an indeterminate hypodensity within the left periventricular white matter which was felt to possibly be due to CVA.  Neurology was consulted and recommended starting plavix and obtaining MRI brain. MRI of the brain noted punctate 1mm acute infarction in the left mid brain.  KUB noted large amount of stool and ? Ileus.  2d echo noted LVEF 35-40% along with aortic sclerosis and grade 1 diastolic dysfunction.  Carotids showed no significant stenosis.  He then returned to the Ed on 8/19 due to dizziness. Follow up CT head was stable, and in fact the lesion seen initially was not visible.  He has not been able to tolerate aggrennox due to HA.  He reports that his dizziness is worse when he lays down at night.  Bed starts spinning. He reports minimal dizziness with moving about.  He reports left upper arm numbness since the stroke.     DM2- reports that his sugars are running in the low 200's. He [place] currently on lantus using 5-8 units.  Reports that his AM fasting sugars are around 260.  He is also continuing humalog tid sliding scale.  He is following with ENDO at [facility]- Dr. Lafe Garin.  He is due for follow.     Review of Systems See HPI  Past Medical History  Diagnosis Date  . Hypertension   . High cholesterol   . GERD (gastroesophageal reflux disease)   . GI bleed     a. Recurrent GI bleed, tx with IV iron. b. Per heme notes - likely AVMs 04/2012 (tx with cauterization several months ago).  . Orthostatic hypotension     a. Tx with florinef.  . Hematuria     a.  Urology note scan from 07/2012: cystoscopy without evidence for bladder lesion, only lateral hypertrophy of posterior urethra, bladder impression from BPH. b. Pt states he had "some tests" scheduled for later in July 2014.  . Stage III chronic kidney disease     a. Stage 3 (DM with complications ->CKD, peripheral neuropathy).  . Anemia, iron deficiency 03/24/2011    a. Recurrent GI bleed, tx with periodic iron infusions.  . 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 Sleep apnea     "had mask; couldn't sleep in it" (09/20/2012)  . Type II diabetes mellitus     a. Dx 1994, uncontrolled.   . Diabetic peripheral neuropathy 2014    foot pain.  . Chronic lower back pain   . Fatty liver     on CT of 11/2010  . AVM (arteriovenous malformation) of duodenum, acquired     egds in 01/2012, 04/2011  . Polyp, colonic     Colonoscopy 01/2012 "benign" polyp  . CAD (coronary artery disease)     a. Cath 09/2012: moderate borderline CAD in mid LAD/small diagonal branch, mild RCA stenosis, to be managed medically   . BPH (benign prostatic hyperplasia)   . Prostate cancer   . Stroke     History  Social History  . Marital Status: Married    Spouse Name: N/A    Number of Children: 2  . Years of Education: N/A   Occupational History  . taken out of work due to back    Social History Main Topics  . Smoking status: Former Smoker -- 1.00 packs/day for 48 years    Types: Cigarettes    Quit date: 03/17/2012  . Smokeless tobacco: Never Used  . Alcohol Use: No     Comment: 09/20/2012 "Used to; stopped ~ 2009; never had problem w/it"  . Drug Use: No  . Sexual Activity: No   Other Topics Concern  . Not on file   Social History Narrative  . No narrative on file    Past Surgical History  Procedure Laterality Date  . Shoulder open rotator cuff repair Left 1980's  . Inguinal hernia repair Right 2011  . Lumbar disc surgery  1980's    Family History  Problem Relation Age of Onset  . Heart attack Brother      Died at 4  . Stroke Father     Died at 47  . Diabetes Sister   . Diabetes Mother   . Stomach cancer Brother   . Heart attack Sister     No Known Allergies  Current Outpatient Prescriptions on File Prior to Visit  Medication Sig Dispense Refill  . atorvastatin (LIPITOR) 80 MG tablet Take 1 tablet (80 mg total) by mouth daily.  30 tablet  2  . carvedilol (COREG) 3.125 MG tablet Take 1 tablet (3.125 mg total) by mouth 2 (two) times daily with a meal.  30 tablet  2  . Cholecalciferol (VITAMIN D) 2000 UNITS CAPS Take 2,000 Units by mouth daily.      [name] Kitchen dipyridamole-aspirin (AGGRENOX) 200-25 MG per 12 hr capsule Take 1 capsule by mouth 2 (two) times daily.  60 capsule  2  . esomeprazole (NEXIUM) 40 MG capsule Take 40 mg by mouth 2 (two) times daily.      . fluticasone (FLONASE) 50 MCG/ACT nasal spray Place 2 sprays into the nose daily.      [name] Kitchen gabapentin (NEURONTIN) 300 MG capsule Take 300 mg by mouth at bedtime.      . insulin glargine (LANTUS) 100 UNIT/ML injection Inject 40 Units into the skin at bedtime.      . insulin lispro (HUMALOG) 100 UNIT/ML injection Inject 5 Units into the skin 3 (three) times daily before meals.      . Insulin Syringe-Needle U-100 (BD INSULIN SYRINGE ULTRAFINE) 31G X 5/16" 0.5 ML MISC Use 3x a day as advised  100 each  1  . losartan (COZAAR) 100 MG tablet Take 100 mg by mouth daily.      . metFORMIN (GLUCOPHAGE) 500 MG tablet Take 1 tablet (500 mg total) by mouth 2 (two) times daily with a meal.      . Multiple Vitamin (MULTIVITAMIN WITH MINERALS) TABS tablet Take 1 tablet by mouth daily.      . nitroGLYCERIN (NITROSTAT) 0.4 MG SL tablet Place 1 tablet (0.4 mg total) under the tongue every 5 (five) minutes as needed for chest pain (up to 3 doses).  25 tablet  2  . polyvinyl alcohol (LIQUIFILM TEARS) 1.4 % ophthalmic solution Place 1 drop into both eyes daily as needed (dry eyes).      . tamsulosin (FLOMAX) 0.4 MG CAPS Take 0.4 mg by mouth daily after supper.        No current facility-administered  medications on file prior to visit.    BP 124/60  Pulse 100  Temp(Src) 98.3 F (36.8 C) (Oral)  Wt 163 lb (73.936 kg)  BMI 25.52 kg/m2  SpO2 98%       Objective:   Physical Exam  Constitutional: He is oriented to person, place, and time. He appears well-developed and well-nourished. No distress.  Cardiovascular: Normal rate and regular rhythm.   No murmur heard. Pulmonary/Chest: Effort normal and breath sounds normal. No respiratory distress. He has no wheezes. He has no rales. He exhibits no tenderness.  Neurological: He is alert and oriented to person, place, and time.  Bilateral UE/LE strength is 5/5 + facial symmetry, steady gait  Skin: Skin is warm and dry.  Psychiatric: He has a normal mood and affect. His behavior is normal. Judgment and thought content normal.          Assessment & Plan:

## 2012-11-16 NOTE — Patient Instructions (Addendum)
Complete lab work prior to leaving. Restart aggrenox once daily for 1 week, take a tylenol along with the dose to prevent headache.  On second week, increase aggrenox to twice daily. Call if you are unable to tolerate aggrenox due to headache please call us. Schedule a follow up appointment with Dr. Elvera Lennox for your sugars. Follow up with [place] in 3 months.

## 2012-11-17 ENCOUNTER — Ambulatory Visit: Payer: Medicare Other | Attending: Internal Medicine

## 2012-11-17 ENCOUNTER — Encounter: Payer: Self-pay | Admitting: Family

## 2012-11-17 DIAGNOSIS — I639 Cerebral infarction, unspecified: Secondary | ICD-10-CM | POA: Insufficient documentation

## 2012-11-17 DIAGNOSIS — R262 Difficulty in walking, not elsewhere classified: Secondary | ICD-10-CM | POA: Insufficient documentation

## 2012-11-17 DIAGNOSIS — Z5189 Encounter for other specified aftercare: Secondary | ICD-10-CM | POA: Insufficient documentation

## 2012-11-17 DIAGNOSIS — R109 Unspecified abdominal pain: Secondary | ICD-10-CM | POA: Insufficient documentation

## 2012-11-17 DIAGNOSIS — Z8673 Personal history of transient ischemic attack (TIA), and cerebral infarction without residual deficits: Secondary | ICD-10-CM | POA: Insufficient documentation

## 2012-11-17 NOTE — Assessment & Plan Note (Signed)
Pt to start PT/OT.  Pt reports that he was ultimately discharged on aggrenox but stopped due to HA. Advised pt to resume once daily with tylenol and titrate up twice daily as tolerated.  If unable to tolerate, consider changing to plavix.

## 2012-11-17 NOTE — Assessment & Plan Note (Signed)
Uncontrolled, advised pt to schedule follow up with Dr. Elvera Lennox, endo.

## 2012-11-17 NOTE — Assessment & Plan Note (Signed)
Kidney function improving, creatinine down to 1.2.

## 2012-11-18 ENCOUNTER — Encounter: Payer: Self-pay | Admitting: Nurse Practitioner

## 2012-11-18 ENCOUNTER — Ambulatory Visit (INDEPENDENT_AMBULATORY_CARE_PROVIDER_SITE_OTHER): Payer: Medicare Other | Admitting: Nurse Practitioner

## 2012-11-18 ENCOUNTER — Other Ambulatory Visit: Payer: Self-pay | Admitting: *Deleted

## 2012-11-18 VITALS — BP 149/83 | HR 92 | Temp 98.1°F | Ht 67.5 in | Wt 165.0 lb

## 2012-11-18 DIAGNOSIS — E1142 Type 2 diabetes mellitus with diabetic polyneuropathy: Secondary | ICD-10-CM

## 2012-11-18 DIAGNOSIS — C61 Malignant neoplasm of prostate: Secondary | ICD-10-CM

## 2012-11-18 DIAGNOSIS — E1165 Type 2 diabetes mellitus with hyperglycemia: Secondary | ICD-10-CM

## 2012-11-18 DIAGNOSIS — I639 Cerebral infarction, unspecified: Secondary | ICD-10-CM

## 2012-11-18 DIAGNOSIS — I6350 Cerebral infarction due to unspecified occlusion or stenosis of unspecified cerebral artery: Secondary | ICD-10-CM

## 2012-11-18 DIAGNOSIS — IMO0002 Reserved for concepts with insufficient information to code with codable children: Secondary | ICD-10-CM

## 2012-11-18 DIAGNOSIS — IMO0001 Reserved for inherently not codable concepts without codable children: Secondary | ICD-10-CM

## 2012-11-18 DIAGNOSIS — E785 Hyperlipidemia, unspecified: Secondary | ICD-10-CM

## 2012-11-18 DIAGNOSIS — E1149 Type 2 diabetes mellitus with other diabetic neurological complication: Secondary | ICD-10-CM

## 2012-11-18 DIAGNOSIS — E1140 Type 2 diabetes mellitus with diabetic neuropathy, unspecified: Secondary | ICD-10-CM

## 2012-11-18 DIAGNOSIS — I10 Essential (primary) hypertension: Secondary | ICD-10-CM

## 2012-11-18 NOTE — Patient Instructions (Signed)
Continue dipyridamole SR 250 mg/aspirin 25 mg orally twice a day  for secondary stroke prevention and maintain strict control of hypertension with blood pressure goal below 130/90, diabetes with hemoglobin A1c goal below 6.5% and lipids with LDL cholesterol goal below 100 mg/dL.   Followup in the future with me in 3 months.   STROKE/TIA INSTRUCTIONS SMOKING Cigarette smoking nearly doubles your risk of having a stroke & is the single most alterable risk factor  If you smoke or have smoked in the last 12 months, you are advised to quit smoking for your health.  Most of the excess cardiovascular risk related to smoking disappears within a year of stopping.  Ask you doctor about anti-smoking medications  [place] Quit Line: 1-800-QUIT NOW  Free Smoking Cessation Classes [phone]  CHOLESTEROL Know your levels; limit fat & cholesterol in your diet  Lab Results  Component Value Date   CHOL 153 10/31/2012   HDL 29* 10/31/2012   LDLCALC 83 10/31/2012   TRIG 203* 10/31/2012   CHOLHDL 5.3 10/31/2012      Many patients benefit from treatment even if their cholesterol is at goal.  Goal: Total Cholesterol less than 160  Goal:  LDL less than 100  Goal:  HDL greater than 40  Goal:  Triglycerides less than 150  BLOOD PRESSURE American Stroke Association blood pressure target is less that 120/80 mm/Hg  Your discharge blood pressure is:  BP: 149/83 mmHg  Monitor your blood pressure  Limit your salt and alcohol intake  Many individuals will require more than one medication for high blood pressure  DIABETES (A1c is a blood sugar average for last 3 months) Goal A1c is under 7% (A1c is blood sugar average for last 3 months)  Diabetes: Diagnosis of diabetes:  A1c was not drawn this admission    Lab Results  Component Value Date   HGBA1C 9.5* 10/31/2012    Your A1c can be lowered with medications, healthy diet, and exercise.  Check your blood sugar as directed by your physician  Call your  physician if you experience unexplained or low blood sugars.  PHYSICAL ACTIVITY/REHABILITATION Goal is 30 minutes at least 4 days per week    Activity decreases your risk of heart attack and stroke and makes your heart stronger.  It helps control your weight and blood pressure; helps you relax and can improve your mood.  Participate in a regular exercise program.  Talk with your doctor about the best form of exercise for you (dancing, walking, swimming, cycling).  DIET/WEIGHT Goal is to maintain a healthy weight  Your height is:  Height: 5' 7.5" (171.5 cm) Your current weight is: Weight: 165 lb (74.844 kg) Your body Mass Index (BMI) is:  BMI (Calculated): 25.5  Following the type of diet specifically designed for you will help prevent another stroke.  Your goal Body Mass Index (BMI) is 19-24.  Healthy food habits can help reduce 3 risk factors for stroke:  High cholesterol, hypertension, and excess weight.

## 2012-11-18 NOTE — Progress Notes (Signed)
[facility]  PATIENT: [name] DOB: 10/05/1945   HISTORY FROM: patient, chart REASON FOR VISIT: routine follow up  HISTORY OF PRESENT ILLNESS:  [name] is a RH 67 y.o. male who reports that he went to bed normal on Thursday, 10/28/12. He awakened in the middle of the night and was dizzy. He has been dizzy ever since. He is dizzy in all positions and reports that it is worse when he is lying down because he feels that the bed is rocking. He has been able to walk but feels off balance when walking. The patient reports some nausea and vomiting. There has been no asociated weakness, numbness , difficulty with speech or double vision.  Patient was not a TPA candidate secondary to delay in arrival. He was admitted for further evaluation and treatment.   UPDATE 11/18/12 (LL): Patient comes to office for post-stroke hospital follow up.  He was discharged on Aggrenox due to the increased risk of GIB since he has a history of multiple GIB.  He reports that he has no residual neurovascular symptoms.  He reports that the Aggrenox gave him a splitting headache when he took it twice a day, even when taking Tylenol with it, so he has only been taking it once a day.  Patient denies medication side effects, with no signs of bleeding or excessive bruising.  He was started on aggressive diabetes treatment in hospital, a1c was 9.5, and states he is committed to improving.  Blood pressure is controlled but elevated today in office is 149/83.  He had trouble with hypotension in the past.  He has prostate cancer, surgery is scheduled for Seotember 29.  REVIEW OF SYSTEMS: Full 14 system review of systems performed and notable only for: constitutional: N/A  cardiovascular: N/A respiratory: N/A endocrine: N/A  ear/nose/throat: N/A  Hematology/Lymph: N/A musculoskeletal: N/A skin: N/A genitourinary: N/A Gastrointestinal: N/A allergy/immunology: N/A neurological: N/A sleep:  N/A psychiatric: N/A   ALLERGIES: No Known Allergies  HOME MEDICATIONS: Outpatient Prescriptions Prior to Visit  Medication Sig Dispense Refill  . AGGRENOX 25-200 MG per 12 hr capsule       . atorvastatin (LIPITOR) 80 MG tablet Take 1 tablet (80 mg total) by mouth daily.  30 tablet  2  . atorvastatin (LIPITOR) 80 MG tablet       . carvedilol (COREG) 3.125 MG tablet Take 1 tablet (3.125 mg total) by mouth 2 (two) times daily with a meal.  30 tablet  2  . Cholecalciferol (VITAMIN D) 2000 UNITS CAPS Take 2,000 Units by mouth daily.      [name] Kitchen dipyridamole-aspirin (AGGRENOX) 200-25 MG per 12 hr capsule Take 1 capsule by mouth 2 (two) times daily.  60 capsule  2  . esomeprazole (NEXIUM) 40 MG capsule Take 40 mg by mouth 2 (two) times daily.      . fluticasone (FLONASE) 50 MCG/ACT nasal spray Place 2 sprays into the nose daily.      [name] Kitchen gabapentin (NEURONTIN) 300 MG capsule Take 300 mg by mouth at bedtime.      . insulin glargine (LANTUS) 100 UNIT/ML injection Inject 40 Units into the skin at bedtime.      . insulin lispro (HUMALOG) 100 UNIT/ML injection Inject 5 Units into the skin 3 (three) times daily before meals.      . Insulin Syringe-Needle U-100 (BD INSULIN SYRINGE ULTRAFINE) 31G X 5/16" 0.5 ML MISC Use 3x a day as advised  100 each  1  .  losartan (COZAAR) 100 MG tablet Take 100 mg by mouth daily.      [name] Kitchen losartan (COZAAR) 100 MG tablet       . metFORMIN (GLUCOPHAGE) 500 MG tablet Take 1 tablet (500 mg total) by mouth 2 (two) times daily with a meal.      . metFORMIN (GLUCOPHAGE) 500 MG tablet       . Multiple Vitamin (MULTIVITAMIN WITH MINERALS) TABS tablet Take 1 tablet by mouth daily.      [name] Kitchen NEXIUM 40 MG capsule       . nitroGLYCERIN (NITROSTAT) 0.4 MG SL tablet Place 1 tablet (0.4 mg total) under the tongue every 5 (five) minutes as needed for chest pain (up to 3 doses).  25 tablet  2  . polyvinyl alcohol (LIQUIFILM TEARS) 1.4 % ophthalmic solution Place 1 drop into both eyes daily as  needed (dry eyes).      . tamsulosin (FLOMAX) 0.4 MG CAPS capsule       . tamsulosin (FLOMAX) 0.4 MG CAPS Take 0.4 mg by mouth daily after supper.       No facility-administered medications prior to visit.    PAST MEDICAL HISTORY: Past Medical History  Diagnosis Date  . Hypertension   . High cholesterol   . GERD (gastroesophageal reflux disease)   . GI bleed     a. Recurrent GI bleed, tx with IV iron. b. Per heme notes - likely AVMs 04/2012 (tx with cauterization several months ago).  . Orthostatic hypotension     a. Tx with florinef.  . Hematuria     a. Urology note scan from 07/2012: cystoscopy without evidence for bladder lesion, only lateral hypertrophy of posterior urethra, bladder impression from BPH. b. Pt states he had "some tests" scheduled for later in July 2014.  . Stage III chronic kidney disease     a. Stage 3 (DM with complications ->CKD, peripheral neuropathy).  . Anemia, iron deficiency 03/24/2011    a. Recurrent GI bleed, tx with periodic iron infusions.  . 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 Sleep apnea     "had mask; couldn't sleep in it" (09/20/2012)  . Type II diabetes mellitus     a. Dx 1994, uncontrolled.   . Diabetic peripheral neuropathy 2014    foot pain.  . Chronic lower back pain   . Fatty liver     on CT of 11/2010  . AVM (arteriovenous malformation) of duodenum, acquired     egds in 01/2012, 04/2011  . Polyp, colonic     Colonoscopy 01/2012 "benign" polyp  . CAD (coronary artery disease)     a. Cath 09/2012: moderate borderline CAD in mid LAD/small diagonal branch, mild RCA stenosis, to be managed medically   . BPH (benign prostatic hyperplasia)   . Prostate cancer   . Stroke     PAST SURGICAL HISTORY: Past Surgical History  Procedure Laterality Date  . Shoulder open rotator cuff repair Left 1980's  . Inguinal hernia repair Right 2011  . Lumbar disc surgery  1980's    FAMILY HISTORY: Family History  Problem Relation Age of Onset  . Heart  attack Brother     Died at 60  . Stroke Father     Died at 84  . Diabetes Sister   . Diabetes Mother   . Stomach cancer Brother   . Heart attack Sister     SOCIAL HISTORY: History   Social History  . Marital Status: Married    Spouse  Name: N/A    Number of Children: 2  . Years of Education: N/A   Occupational History  . taken out of work due to back    Social History Main Topics  . Smoking status: Former Smoker -- 1.00 packs/day for 48 years    Types: Cigarettes    Quit date: 03/17/2012  . Smokeless tobacco: Never Used  . Alcohol Use: No     Comment: 09/20/2012 "Used to; stopped ~ 2009; never had problem w/it"  . Drug Use: No  . Sexual Activity: No   Other Topics Concern  . Not on file   Social History Narrative  . No narrative on file     PHYSICAL EXAM  Filed Vitals:   11/18/12 1457  BP: 149/83  Pulse: 92  Temp: 98.1 F (36.7 C)  TempSrc: Oral  Height: 5' 7.5" (1.715 m)  Weight: 165 lb (74.844 kg)   Body mass index is 25.45 kg/(m^2).  Generalized: In no acute distress, pleasant AA male   Neck: Supple, no carotid bruits   Cardiac: Regular rate rhythm, no murmur   Pulmonary: Clear to auscultation bilaterally   Musculoskeletal: No deformity   Neurological examination   Mentation: Alert oriented to time, place, history taking, language fluent, and casual conversation  Cranial nerve II-XII: Pupils were equal round reactive to light extraocular movements were full, visual field were full on confrontational test. facial sensation and strength were normal. hearing was intact to finger rubbing bilaterally. Uvula tongue midline. head turning and shoulder shrug and were normal and symmetric.Tongue protrusion into cheek strength was normal. MOTOR: normal bulk and tone, full strength in the BUE, BLE, fine finger movements normal, no pronator drift SENSORY: normal and symmetric to light touch, pinprick, temperature, vibration and proprioception COORDINATION:  finger-nose-finger, heel-to-shin bilaterally, there was no truncal ataxia REFLEXES: Brachioradialis 2/2, biceps 2/2, triceps 2/2, patellar 2/2, Achilles 2/2, plantar responses were flexor bilaterally. GAIT/STATION: Rising up from seated position without assistance, normal stance, without trunk ataxia, moderate stride, good arm swing, smooth turning, able to perform tiptoe, and heel walking without difficulty.    DIAGNOSTIC DATA (LABS, IMAGING, TESTING) - I reviewed patient records, labs, notes, testing and imaging myself where available.  Lab Results  Component Value Date   WBC 4.1 11/01/2012   HGB 10.9* 11/02/2012   HCT 32.0* 11/02/2012   MCV 85.6 11/01/2012   PLT 253 11/01/2012      Component Value Date/Time   NA 136 11/16/2012 1443   NA 142 09/27/2012 0952   K 4.2 11/16/2012 1443   K 3.3 09/27/2012 0952   CL 100 11/16/2012 1443   CL 102 09/27/2012 0952   CO2 29 11/16/2012 1443   CO2 31 09/27/2012 0952   GLUCOSE 297* 11/16/2012 1443   GLUCOSE 235* 09/27/2012 0952   BUN 15 11/16/2012 1443   BUN 11 09/27/2012 0952   CREATININE 1.22 11/16/2012 1443   CREATININE 1.40* 11/02/2012 1457   CALCIUM 8.8 11/16/2012 1443   CALCIUM 9.0 09/27/2012 0952   PROT 6.4 11/01/2012 0604   PROT 7.6 01/01/2011 1358   ALBUMIN 2.7* 11/01/2012 0604   AST 19 11/01/2012 0604   AST 19 01/01/2011 1358   ALT 13 11/01/2012 0604   ALT 21 01/01/2011 1358   ALKPHOS 85 11/01/2012 0604   ALKPHOS 107* 01/01/2011 1358   BILITOT 0.3 11/01/2012 0604   BILITOT 0.50 01/01/2011 1358   GFRNONAA 64* 11/01/2012 0604   GFRAA 75* 11/01/2012 0604   Lab Results  Component Value Date  CHOL 153 10/31/2012   HDL 29* 10/31/2012   LDLCALC 83 10/31/2012   TRIG 203* 10/31/2012   CHOLHDL 5.3 10/31/2012   Lab Results  Component Value Date   HGBA1C 9.5* 10/31/2012   No results found for this basename: [id number]   Lab Results  Component Value Date   TSH 0.899 09/20/2012    CT of the brain 10/31/2012 1. Age indeterminate hypodensity within the left  periventricular white matter. If there is clinical concern for possible acute/subacute ischemic infarct, further evaluation with MRI could be performed. 2. No acute intracranial hemorrhage. 3. Atrophy with chronic small vessel ischemic disease.  MRI of the brain 10/31/2012 Suspicion of a punctate (1 mm) acute infarction in the left mid brain. Extensive old small vessel ischemic changes throughout the brainstem, cerebellum and cerebral hemispheric white matter. Low density in the left hemispheric white matter at CT questioned as possibly acute is old.  MRA of the brain See TCD  2D Echocardiogram  Carotid Doppler No evidence of hemodynamically significant internal carotid artery stenosis. Vertebral artery flow is antegrade.  CXR 10/31/2012 No acute cardiopulmonary process.  EKG normal EKG, normal sinus rhythm.  TCD 11/01/12 This was a normal transcranial Doppler study, with normal flow direction and velocity of all identified vessels of the anterior and posterior circulations, with no evidence of stenosis, vasospasm or occlusion. There was no evidence of intracranial disease.  2D Echo 11/01/12  The estimated ejection fraction was in the range of 35% to40%. Doppler parameters are consistent with abnormal left ventricular relaxation (grade 1 diastolic dysfunction). No cardiac source of embolism was identified,  ASSESSMENT AND PLAN [name] is a 67 y.o. male presenting with dizziness on 11/01/12. 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 Stroke risk factors of Hyperlipidemia,  Diabetes, Hypertension, CKD stage 4, obstructive sleep apnea, not tolerant of cpap; CAD  Continue dipyridamole SR 250 mg/aspirin 25 mg orally twice a day  for secondary stroke prevention and maintain strict control of hypertension with blood pressure goal below 130/90, diabetes with hemoglobin A1c goal below 6.5% and lipids with LDL cholesterol goal below 100  mg/dL.   Patient advised to try to take Aggrenox twice a day.  He was aggreable to try.  If not tolerated, he will call office for further instructions. Followup in 3 months.  Everest Brod NP-C 11/18/2012, 6:34 PM  [facility] [address], [address] [place], [place] [address] [phone]

## 2012-11-22 ENCOUNTER — Emergency Department (HOSPITAL_BASED_OUTPATIENT_CLINIC_OR_DEPARTMENT_OTHER): Payer: Medicare Other

## 2012-11-22 ENCOUNTER — Encounter (HOSPITAL_BASED_OUTPATIENT_CLINIC_OR_DEPARTMENT_OTHER): Payer: Self-pay

## 2012-11-22 ENCOUNTER — Emergency Department (HOSPITAL_BASED_OUTPATIENT_CLINIC_OR_DEPARTMENT_OTHER)
Admission: EM | Admit: 2012-11-22 | Discharge: 2012-11-22 | Disposition: A | Discharge status: Home / Self Care | Payer: Medicare Other | Attending: Emergency Medicine | Admitting: Emergency Medicine

## 2012-11-22 DIAGNOSIS — G8929 Other chronic pain: Secondary | ICD-10-CM | POA: Insufficient documentation

## 2012-11-22 DIAGNOSIS — Z86718 Personal history of other venous thrombosis and embolism: Secondary | ICD-10-CM | POA: Insufficient documentation

## 2012-11-22 DIAGNOSIS — Z79899 Other long term (current) drug therapy: Secondary | ICD-10-CM | POA: Insufficient documentation

## 2012-11-22 DIAGNOSIS — E1142 Type 2 diabetes mellitus with diabetic polyneuropathy: Secondary | ICD-10-CM | POA: Insufficient documentation

## 2012-11-22 DIAGNOSIS — N183 Chronic kidney disease, stage 3 unspecified: Secondary | ICD-10-CM | POA: Insufficient documentation

## 2012-11-22 DIAGNOSIS — IMO0002 Reserved for concepts with insufficient information to code with codable children: Secondary | ICD-10-CM | POA: Insufficient documentation

## 2012-11-22 DIAGNOSIS — N400 Enlarged prostate without lower urinary tract symptoms: Secondary | ICD-10-CM | POA: Insufficient documentation

## 2012-11-22 DIAGNOSIS — Z860100 Personal history of colon polyps, unspecified: Secondary | ICD-10-CM | POA: Insufficient documentation

## 2012-11-22 DIAGNOSIS — Z8546 Personal history of malignant neoplasm of prostate: Secondary | ICD-10-CM | POA: Insufficient documentation

## 2012-11-22 DIAGNOSIS — Z87891 Personal history of nicotine dependence: Secondary | ICD-10-CM | POA: Insufficient documentation

## 2012-11-22 DIAGNOSIS — E1149 Type 2 diabetes mellitus with other diabetic neurological complication: Secondary | ICD-10-CM | POA: Insufficient documentation

## 2012-11-22 DIAGNOSIS — G4730 Sleep apnea, unspecified: Secondary | ICD-10-CM | POA: Insufficient documentation

## 2012-11-22 DIAGNOSIS — N3090 Cystitis, unspecified without hematuria: Secondary | ICD-10-CM | POA: Insufficient documentation

## 2012-11-22 DIAGNOSIS — Z8673 Personal history of transient ischemic attack (TIA), and cerebral infarction without residual deficits: Secondary | ICD-10-CM | POA: Insufficient documentation

## 2012-11-22 DIAGNOSIS — D631 Anemia in chronic kidney disease: Secondary | ICD-10-CM | POA: Insufficient documentation

## 2012-11-22 DIAGNOSIS — I129 Hypertensive chronic kidney disease with stage 1 through stage 4 chronic kidney disease, or unspecified chronic kidney disease: Secondary | ICD-10-CM | POA: Insufficient documentation

## 2012-11-22 DIAGNOSIS — M549 Dorsalgia, unspecified: Secondary | ICD-10-CM | POA: Insufficient documentation

## 2012-11-22 DIAGNOSIS — I2510 Atherosclerotic heart disease of native coronary artery without angina pectoris: Secondary | ICD-10-CM | POA: Insufficient documentation

## 2012-11-22 DIAGNOSIS — K219 Gastro-esophageal reflux disease without esophagitis: Secondary | ICD-10-CM | POA: Insufficient documentation

## 2012-11-22 DIAGNOSIS — Z794 Long term (current) use of insulin: Secondary | ICD-10-CM | POA: Insufficient documentation

## 2012-11-22 LAB — URINE MICROSCOPIC-ADD ON

## 2012-11-22 LAB — CBC WITH DIFFERENTIAL/PLATELET
Basophils Absolute: 0 10*3/uL (ref 0.0–0.1)
Basophils Relative: 1 % (ref 0–1)
Eosinophils Absolute: 0.2 10*3/uL (ref 0.0–0.7)
MCH: 30.4 pg (ref 26.0–34.0)
MCHC: 34.4 g/dL (ref 30.0–36.0)
Monocytes Relative: 13 % — ABNORMAL HIGH (ref 3–12)
Neutro Abs: 4.3 10*3/uL (ref 1.7–7.7)
Neutrophils Relative %: 66 % (ref 43–77)
Platelets: 279 10*3/uL (ref 150–400)
RDW: 13.7 % (ref 11.5–15.5)

## 2012-11-22 LAB — BASIC METABOLIC PANEL
CO2: 28 mEq/L (ref 19–32)
Chloride: 100 mEq/L (ref 96–112)
Creatinine, Ser: 1.2 mg/dL (ref 0.50–1.35)
GFR calc Af Amer: 71 mL/min — ABNORMAL LOW (ref >90)
Potassium: 4 mEq/L (ref 3.5–5.1)
Sodium: 136 mEq/L (ref 135–145)

## 2012-11-22 LAB — URINALYSIS, ROUTINE W REFLEX MICROSCOPIC
Glucose, UA: >1000 mg/dL — AB
Leukocytes, UA: NEGATIVE
Protein, ur: >300 mg/dL — AB
pH: 6 (ref 5.0–8.0)

## 2012-11-22 MED ORDER — MORPHINE SULFATE 2 MG/ML IJ SOLN
2.0000 mg | Freq: Once | INTRAMUSCULAR | Status: AC
  Administered 2012-11-22: 2 mg via INTRAMUSCULAR
  Filled 2012-11-22: qty 1

## 2012-11-22 MED ORDER — SULFAMETHOXAZOLE-TMP DS 800-160 MG PO TABS
1.0000 | ORAL_TABLET | Freq: Once | ORAL | Status: AC
  Administered 2012-11-22: 1 via ORAL
  Filled 2012-11-22: qty 1

## 2012-11-22 MED ORDER — HYDROCODONE-ACETAMINOPHEN 5-325 MG PO TABS: 1.0000 | ORAL_TABLET | Freq: Four times a day (QID) | ORAL | Status: DC | PRN

## 2012-11-22 MED ORDER — SULFAMETHOXAZOLE-TRIMETHOPRIM 800-160 MG PO TABS: 1.0000 | ORAL_TABLET | Freq: Two times a day (BID) | ORAL | Status: DC

## 2012-11-22 MED ORDER — KETOROLAC TROMETHAMINE 60 MG/2ML IM SOLN
60.0000 mg | Freq: Once | INTRAMUSCULAR | Status: AC
  Administered 2012-11-22: 60 mg via INTRAMUSCULAR
  Filled 2012-11-22: qty 2

## 2012-11-22 NOTE — ED Provider Notes (Signed)
CSN: [id number]     Arrival date & time 11/22/12  0306 History   First MD Initiated Contact with Patient 11/22/12 [phone]     Chief Complaint  Patient presents with  . Back Pain  . Flank Pain   (Consider location/radiation/quality/duration/timing/severity/associated sxs/prior Treatment) Patient is a 67 y.o. male presenting with flank pain. The history is provided by the patient. No language interpreter was used.  Flank Pain This is a new problem. The current episode started 2 days ago. The problem occurs constantly. The problem has not changed since onset.Pertinent negatives include no chest pain, no headaches and no shortness of breath. Nothing aggravates the symptoms. Nothing relieves the symptoms. Treatments tried: percocet. The treatment provided mild relief.    Past Medical History  Diagnosis Date  . Hypertension   . High cholesterol   . GERD (gastroesophageal reflux disease)   . GI bleed     a. Recurrent GI bleed, tx with IV iron. b. Per heme notes - likely AVMs 04/2012 (tx with cauterization several months ago).  . Orthostatic hypotension     a. Tx with florinef.  . Hematuria     a. Urology note scan from 07/2012: cystoscopy without evidence for bladder lesion, only lateral hypertrophy of posterior urethra, bladder impression from BPH. b. Pt states he had "some tests" scheduled for later in July 2014.  . Stage III chronic kidney disease     a. Stage 3 (DM with complications ->CKD, peripheral neuropathy).  . Anemia, iron deficiency 03/24/2011    a. Recurrent GI bleed, tx with periodic iron infusions.  . 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 Sleep apnea     "had mask; couldn't sleep in it" (09/20/2012)  . Type II diabetes mellitus     a. Dx 1994, uncontrolled.   . Diabetic peripheral neuropathy 2014    foot pain.  . Chronic lower back pain   . Fatty liver     on CT of 11/2010  . AVM (arteriovenous malformation) of duodenum, acquired     egds in 01/2012, 04/2011  . Polyp, colonic    Colonoscopy 01/2012 "benign" polyp  . CAD (coronary artery disease)     a. Cath 09/2012: moderate borderline CAD in mid LAD/small diagonal branch, mild RCA stenosis, to be managed medically   . BPH (benign prostatic hyperplasia)   . Prostate cancer   . Stroke    Past Surgical History  Procedure Laterality Date  . Shoulder open rotator cuff repair Left 1980's  . Inguinal hernia repair Right 2011  . Lumbar disc surgery  1980's   Family History  Problem Relation Age of Onset  . Heart attack Brother     Died at 82  . Stroke Father     Died at 54  . Diabetes Sister   . Diabetes Mother   . Stomach cancer Brother   . Heart attack Sister    History  Substance Use Topics  . Smoking status: Former Smoker -- 1.00 packs/day for 48 years    Types: Cigarettes    Quit date: 03/17/2012  . Smokeless tobacco: Never Used  . Alcohol Use: No     Comment: 09/20/2012 "Used to; stopped ~ 2009; never had problem w/it"    Review of Systems  Respiratory: Negative for shortness of breath.   Cardiovascular: Negative for chest pain.  Genitourinary: Positive for flank pain.  Neurological: Negative for headaches.  All other systems reviewed and are negative.    Allergies  Review of patient's  allergies indicates no known allergies.  Home Medications   Current Outpatient Rx  Name  Route  Sig  Dispense  Refill  . AGGRENOX 25-200 MG per 12 hr capsule               . atorvastatin (LIPITOR) 80 MG tablet   Oral   Take 1 tablet (80 mg total) by mouth daily.   30 tablet   2   . atorvastatin (LIPITOR) 80 MG tablet               . carvedilol (COREG) 3.125 MG tablet   Oral   Take 1 tablet (3.125 mg total) by mouth 2 (two) times daily with a meal.   30 tablet   2   . Cholecalciferol (VITAMIN D) 2000 UNITS CAPS   Oral   Take 2,000 Units by mouth daily.         [name] Kitchen dipyridamole-aspirin (AGGRENOX) 200-25 MG per 12 hr capsule   Oral   Take 1 capsule by mouth 2 (two) times daily.   60  capsule   2     Take  1 capsule daily x 1 week then change to twic ...   . esomeprazole (NEXIUM) 40 MG capsule   Oral   Take 40 mg by mouth 2 (two) times daily.         . fluticasone (FLONASE) 50 MCG/ACT nasal spray   Nasal   Place 2 sprays into the nose daily.         [name] Kitchen gabapentin (NEURONTIN) 300 MG capsule   Oral   Take 300 mg by mouth at bedtime.         . insulin glargine (LANTUS) 100 UNIT/ML injection   Subcutaneous   Inject 40 Units into the skin at bedtime.         . insulin lispro (HUMALOG) 100 UNIT/ML injection   Subcutaneous   Inject 5 Units into the skin 3 (three) times daily before meals.         . Insulin Syringe-Needle U-100 (BD INSULIN SYRINGE ULTRAFINE) 31G X 5/16" 0.5 ML MISC      Use 3x a day as advised   100 each   1   . losartan (COZAAR) 100 MG tablet   Oral   Take 100 mg by mouth daily.         [name] Kitchen losartan (COZAAR) 100 MG tablet               . metFORMIN (GLUCOPHAGE) 500 MG tablet   Oral   Take 1 tablet (500 mg total) by mouth 2 (two) times daily with a meal.           !!!!!!!!!!!!!!!!!!!!!!!!!!!!!!!!!!!!!!!!!!!!!!!!!! ...   . metFORMIN (GLUCOPHAGE) 500 MG tablet               . Multiple Vitamin (MULTIVITAMIN WITH MINERALS) TABS tablet   Oral   Take 1 tablet by mouth daily.         [name] Kitchen NEXIUM 40 MG capsule               . nitroGLYCERIN (NITROSTAT) 0.4 MG SL tablet   Sublingual   Place 1 tablet (0.4 mg total) under the tongue every 5 (five) minutes as needed for chest pain (up to 3 doses).   25 tablet   2     IMPORTANT: Do not take this medication if you have ...   . polyvinyl alcohol (LIQUIFILM TEARS) 1.4 % ophthalmic solution  Both Eyes   Place 1 drop into both eyes daily as needed (dry eyes).         . tamsulosin (FLOMAX) 0.4 MG CAPS capsule               . tamsulosin (FLOMAX) 0.4 MG CAPS   Oral   Take 0.4 mg by mouth daily after supper.          BP 141/89  Pulse 116  Temp(Src) 97.9 F  (36.6 C) (Oral)  Resp 20  Ht 5\' 7"  (1.702 m)  Wt 163 lb (73.936 kg)  BMI 25.52 kg/m2  SpO2 96% Physical Exam  Constitutional: He is oriented to person, place, and time. He appears well-developed and well-nourished. No distress.  HENT:  Head: Normocephalic and atraumatic.  Mouth/Throat: Oropharynx is clear and moist.  Eyes: Conjunctivae are normal. Pupils are equal, round, and reactive to light.  Neck: Normal range of motion. Neck supple.  Cardiovascular: Normal rate, regular rhythm and intact distal pulses.   Pulmonary/Chest: Effort normal and breath sounds normal.  Abdominal: Soft. Bowel sounds are normal. There is no tenderness. There is no rebound and no guarding.  Musculoskeletal: Normal range of motion.  Neurological: He is alert and oriented to person, place, and time.  Skin: Skin is warm and dry.  Psychiatric: He has a normal mood and affect.    ED Course  Procedures (including critical care time) Labs Review Labs Reviewed  URINALYSIS, ROUTINE W REFLEX MICROSCOPIC - Abnormal; Notable for the following:    Glucose, UA >1000 (*)    Hgb urine dipstick MODERATE (*)    Protein, ur >300 (*)    All other components within normal limits  URINE MICROSCOPIC-ADD ON - Abnormal; Notable for the following:    Squamous Epithelial / LPF FEW (*)    Bacteria, UA FEW (*)    Casts HYALINE CASTS (*)    All other components within normal limits  CBC WITH DIFFERENTIAL  BASIC METABOLIC PANEL   Imaging Review No results found.  MDM  No diagnosis found. Will treat for cystitis.  Follow up with urology for ongoing care    Ulys Favia K An Lannan-Rasch, MD 11/22/12 [phone]

## 2012-11-22 NOTE — ED Notes (Signed)
Pt presents with right flank pain that started on Friday.  Pt denies injury.  Denies hematuria or dysuria.

## 2012-11-24 ENCOUNTER — Ambulatory Visit: Payer: Self-pay | Admitting: Neurology

## 2012-11-26 ENCOUNTER — Telehealth: Payer: Self-pay | Admitting: Family

## 2012-11-26 DIAGNOSIS — Z01818 Encounter for other preprocedural examination: Secondary | ICD-10-CM

## 2012-11-26 NOTE — Telephone Encounter (Signed)
Message copied by Sandford Craze on Fri Nov 26, 2012  4:02 PM ------      Message from: LAM, Larita Fife E      Created: Fri Nov 26, 2012  1:41 PM      Regarding: Aggrenox       Dr. Pearlean Brownie always advises patients to delay "elective" surgery or procedures for 6 months post-stroke because the risk of recurrent stroke is the greatest in the first 6 months after stroke.            That being said, this most likely should not be postponed?      It's a tough one.  I would say if it cannot be postponed then he should just be aware of the risk, and stop the Aggrenox for the shortest time possible.            Thanks,      Heide Guile NP            ----- Message -----         From: Sandford Craze, NP         Sent: 11/26/2012   9:10 AM           To: Ronal Fear, NP            Larita Fife,            [name] is being evaluated for upcoming prostate resection due to his hx of prostate cancer.  Urology is requesting if it is OK to stop the aggrenox perioperatively?  I appreciate your input.            Thanks,            Sandford Craze       ------

## 2012-11-26 NOTE — Telephone Encounter (Signed)
Received call from Dr. Isabel Caprice pt's urologist.  Pt will need surgical clearance prior to planned prostatectomy for "fairly aggressive prostate cancer."  Dr. Isabel Caprice would like the pt cleared by cardiology and would also like to know if it is safe for the patient to stop aggrenox.    Will check with neurology re: ok to stop aggrenox?    Will refer to Dr. [place] for cardiac clearance.

## 2012-11-27 ENCOUNTER — Encounter (HOSPITAL_BASED_OUTPATIENT_CLINIC_OR_DEPARTMENT_OTHER): Payer: Self-pay | Admitting: Emergency Medicine

## 2012-11-27 ENCOUNTER — Emergency Department (HOSPITAL_BASED_OUTPATIENT_CLINIC_OR_DEPARTMENT_OTHER)
Admission: EM | Admit: 2012-11-27 | Discharge: 2012-11-27 | Disposition: A | Discharge status: Home / Self Care | Payer: Medicare Other | Attending: Emergency Medicine | Admitting: Emergency Medicine

## 2012-11-27 ENCOUNTER — Emergency Department (HOSPITAL_BASED_OUTPATIENT_CLINIC_OR_DEPARTMENT_OTHER): Payer: Medicare Other

## 2012-11-27 DIAGNOSIS — Z794 Long term (current) use of insulin: Secondary | ICD-10-CM | POA: Insufficient documentation

## 2012-11-27 DIAGNOSIS — M549 Dorsalgia, unspecified: Secondary | ICD-10-CM

## 2012-11-27 DIAGNOSIS — Z79899 Other long term (current) drug therapy: Secondary | ICD-10-CM | POA: Insufficient documentation

## 2012-11-27 DIAGNOSIS — Z7982 Long term (current) use of aspirin: Secondary | ICD-10-CM | POA: Insufficient documentation

## 2012-11-27 DIAGNOSIS — G909 Disorder of the autonomic nervous system, unspecified: Secondary | ICD-10-CM | POA: Insufficient documentation

## 2012-11-27 DIAGNOSIS — I129 Hypertensive chronic kidney disease with stage 1 through stage 4 chronic kidney disease, or unspecified chronic kidney disease: Secondary | ICD-10-CM | POA: Insufficient documentation

## 2012-11-27 DIAGNOSIS — Z8673 Personal history of transient ischemic attack (TIA), and cerebral infarction without residual deficits: Secondary | ICD-10-CM | POA: Insufficient documentation

## 2012-11-27 DIAGNOSIS — E7800 Pure hypercholesterolemia, unspecified: Secondary | ICD-10-CM | POA: Insufficient documentation

## 2012-11-27 DIAGNOSIS — M545 Low back pain, unspecified: Secondary | ICD-10-CM | POA: Insufficient documentation

## 2012-11-27 DIAGNOSIS — E1149 Type 2 diabetes mellitus with other diabetic neurological complication: Secondary | ICD-10-CM | POA: Insufficient documentation

## 2012-11-27 DIAGNOSIS — D631 Anemia in chronic kidney disease: Secondary | ICD-10-CM | POA: Insufficient documentation

## 2012-11-27 DIAGNOSIS — Z9861 Coronary angioplasty status: Secondary | ICD-10-CM | POA: Insufficient documentation

## 2012-11-27 DIAGNOSIS — N183 Chronic kidney disease, stage 3 unspecified: Secondary | ICD-10-CM | POA: Insufficient documentation

## 2012-11-27 DIAGNOSIS — Z860100 Personal history of colon polyps, unspecified: Secondary | ICD-10-CM | POA: Insufficient documentation

## 2012-11-27 DIAGNOSIS — I2510 Atherosclerotic heart disease of native coronary artery without angina pectoris: Secondary | ICD-10-CM | POA: Insufficient documentation

## 2012-11-27 DIAGNOSIS — Z8546 Personal history of malignant neoplasm of prostate: Secondary | ICD-10-CM | POA: Insufficient documentation

## 2012-11-27 DIAGNOSIS — Z87891 Personal history of nicotine dependence: Secondary | ICD-10-CM | POA: Insufficient documentation

## 2012-11-27 DIAGNOSIS — D509 Iron deficiency anemia, unspecified: Secondary | ICD-10-CM | POA: Insufficient documentation

## 2012-11-27 DIAGNOSIS — IMO0002 Reserved for concepts with insufficient information to code with codable children: Secondary | ICD-10-CM | POA: Insufficient documentation

## 2012-11-27 DIAGNOSIS — K219 Gastro-esophageal reflux disease without esophagitis: Secondary | ICD-10-CM | POA: Insufficient documentation

## 2012-11-27 DIAGNOSIS — N400 Enlarged prostate without lower urinary tract symptoms: Secondary | ICD-10-CM | POA: Insufficient documentation

## 2012-11-27 MED ORDER — DIAZEPAM 5 MG PO TABS: 5.0000 mg | ORAL_TABLET | Freq: Two times a day (BID) | ORAL | Status: AC

## 2012-11-27 MED ORDER — OXYCODONE-ACETAMINOPHEN 5-325 MG PO TABS: 1.0000 | ORAL_TABLET | Freq: Four times a day (QID) | ORAL | Status: DC | PRN

## 2012-11-27 MED ORDER — DIAZEPAM 5 MG PO TABS
5.0000 mg | ORAL_TABLET | Freq: Once | ORAL | Status: AC
  Administered 2012-11-27: 5 mg via ORAL
  Filled 2012-11-27: qty 1

## 2012-11-27 MED ORDER — HYDROMORPHONE HCL PF 1 MG/ML IJ SOLN
1.0000 mg | Freq: Once | INTRAMUSCULAR | Status: AC
  Administered 2012-11-27: 1 mg via INTRAMUSCULAR
  Filled 2012-11-27: qty 1

## 2012-11-27 NOTE — ED Notes (Signed)
Pt has chronic back pain, worse this am.  No injury.  No elimination problems. Some numbness and tingling in feet but is chronic.

## 2012-11-27 NOTE — Discharge Instructions (Signed)
As discussed, today's evaluation has not demonstrated acute findings that caused her pain, and your pain is likely due to the inflammation of the lower back soft tissue.  Please take all medication as directed and followup as instructed.  Return here for any concerning changes in your condition.   Back Pain, Adult Low back pain is very common. About 1 in 5 people have back pain.The cause of low back pain is rarely dangerous. The pain often gets better over time.About half of people with a sudden onset of back pain feel better in just 2 weeks. About 8 in 10 people feel better by 6 weeks.  CAUSES Some common causes of back pain include:  Strain of the muscles or ligaments supporting the spine.  Wear and tear (degeneration) of the spinal discs.  Arthritis.  Direct injury to the back. DIAGNOSIS Most of the time, the direct cause of low back pain is not known.However, back pain can be treated effectively even when the exact cause of the pain is unknown.Answering your caregiver's questions about your overall health and symptoms is one of the most accurate ways to make sure the cause of your pain is not dangerous. If your caregiver needs more information, he or she may order lab work or imaging tests (X-rays or MRIs).However, even if imaging tests show changes in your back, this usually does not require surgery. HOME CARE INSTRUCTIONS For many people, back pain returns.Since low back pain is rarely dangerous, it is often a condition that people can learn to [facility] their own.   Remain active. It is stressful on the back to sit or stand in one place. Do not sit, drive, or stand in one place for more than 30 minutes at a time. Take short walks on level surfaces as soon as pain allows.Try to increase the length of time you walk each day.  Do not stay in bed.Resting more than 1 or 2 days can delay your recovery.  Do not avoid exercise or work.Your body is made to move.It is not dangerous  to be active, even though your back may hurt.Your back will likely heal faster if you return to being active before your pain is gone.  Pay attention to your body when you bend and lift. Many people have less discomfortwhen lifting if they bend their knees, keep the load close to their bodies,and avoid twisting. Often, the most comfortable positions are those that put less stress on your recovering back.  Find a comfortable position to sleep. Use a firm mattress and lie on your side with your knees slightly bent. If you lie on your back, put a pillow under your knees.  Only take over-the-counter or prescription medicines as directed by your caregiver. Over-the-counter medicines to reduce pain and inflammation are often the most helpful.Your caregiver may prescribe muscle relaxant drugs.These medicines help dull your pain so you can more quickly return to your normal activities and healthy exercise.  Put ice on the injured area.  Put ice in a plastic bag.  Place a towel between your skin and the bag.  Leave the ice on for 15-20 minutes, 3-4 times a day for the first 2 to 3 days. After that, ice and heat may be alternated to reduce pain and spasms.  Ask your caregiver about trying back exercises and gentle massage. This may be of some benefit.  Avoid feeling anxious or stressed.Stress increases muscle tension and can worsen back pain.It is important to recognize when you are anxious or  stressed and learn ways to manage it.Exercise is a great option. SEEK MEDICAL CARE IF:  You have pain that is not relieved with rest or medicine.  You have pain that does not improve in 1 week.  You have new symptoms.  You are generally not feeling well. SEEK IMMEDIATE MEDICAL CARE IF:   You have pain that radiates from your back into your legs.  You develop new bowel or bladder control problems.  You have unusual weakness or numbness in your arms or legs.  You develop nausea or  vomiting.  You develop abdominal pain.  You feel faint. Document Released: 03/03/2005 Document Revised: 09/02/2011 Document Reviewed: 07/22/2010 [facility] Patient Information 2014 [place], [place].

## 2012-11-27 NOTE — ED Provider Notes (Signed)
CSN: [id number]     Arrival date & time 11/27/12  1231 History   First MD Initiated Contact with Patient 11/27/12 1351     Chief Complaint  Patient presents with  . Back Pain   HPI  Patient presents with concerns of ongoing pain in his right lower back.  Pain has been there for some time, worse over the past few days without clear precipitant, the trauma, fall. The pain is sore, focally about the right superior SI joint with radiation down the posterior of the right leg.  Pain is minimally improved with medication previously prescribed.  Pain is worse with ambulation or exertion. Notably, the patient has recent diagnosis of prostate cancer for which he has not started therapy. She denies current fever, incontinence, dysuria, hematuria, belly pain.   Past Medical History  Diagnosis Date  . Hypertension   . High cholesterol   . GERD (gastroesophageal reflux disease)   . GI bleed     a. Recurrent GI bleed, tx with IV iron. b. Per heme notes - likely AVMs 04/2012 (tx with cauterization several months ago).  . Orthostatic hypotension     a. Tx with florinef.  . Hematuria     a. Urology note scan from 07/2012: cystoscopy without evidence for bladder lesion, only lateral hypertrophy of posterior urethra, bladder impression from BPH. b. Pt states he had "some tests" scheduled for later in July 2014.  . Stage III chronic kidney disease     a. Stage 3 (DM with complications ->CKD, peripheral neuropathy).  . Anemia, iron deficiency 03/24/2011    a. Recurrent GI bleed, tx with periodic iron infusions.  . 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 Sleep apnea     "had mask; couldn't sleep in it" (09/20/2012)  . Type II diabetes mellitus     a. Dx 1994, uncontrolled.   . Diabetic peripheral neuropathy 2014    foot pain.  . Chronic lower back pain   . Fatty liver     on CT of 11/2010  . AVM (arteriovenous malformation) of duodenum, acquired     egds in 01/2012, 04/2011  . Polyp, colonic     Colonoscopy  01/2012 "benign" polyp  . CAD (coronary artery disease)     a. Cath 09/2012: moderate borderline CAD in mid LAD/small diagonal branch, mild RCA stenosis, to be managed medically   . BPH (benign prostatic hyperplasia)   . Prostate cancer   . Stroke    Past Surgical History  Procedure Laterality Date  . Shoulder open rotator cuff repair Left 1980's  . Inguinal hernia repair Right 2011  . Lumbar disc surgery  1980's   Family History  Problem Relation Age of Onset  . Heart attack Brother     Died at 48  . Stroke Father     Died at 28  . Diabetes Sister   . Diabetes Mother   . Stomach cancer Brother   . Heart attack Sister    History  Substance Use Topics  . Smoking status: Former Smoker -- 1.00 packs/day for 48 years    Types: Cigarettes    Quit date: 03/17/2012  . Smokeless tobacco: Never Used  . Alcohol Use: No     Comment: 09/20/2012 "Used to; stopped ~ 2009; never had problem w/it"    Review of Systems  All other systems reviewed and are negative.    Allergies  Review of patient's allergies indicates no known allergies.  Home Medications   Current Outpatient  Rx  Name  Route  Sig  Dispense  Refill  . AGGRENOX 25-200 MG per 12 hr capsule               . atorvastatin (LIPITOR) 80 MG tablet   Oral   Take 1 tablet (80 mg total) by mouth daily.   30 tablet   2   . atorvastatin (LIPITOR) 80 MG tablet               . carvedilol (COREG) 3.125 MG tablet   Oral   Take 1 tablet (3.125 mg total) by mouth 2 (two) times daily with a meal.   30 tablet   2   . Cholecalciferol (VITAMIN D) 2000 UNITS CAPS   Oral   Take 2,000 Units by mouth daily.         [name] Kitchen dipyridamole-aspirin (AGGRENOX) 200-25 MG per 12 hr capsule   Oral   Take 1 capsule by mouth 2 (two) times daily.   60 capsule   2     Take  1 capsule daily x 1 week then change to twic ...   . esomeprazole (NEXIUM) 40 MG capsule   Oral   Take 40 mg by mouth 2 (two) times daily.         .  fluticasone (FLONASE) 50 MCG/ACT nasal spray   Nasal   Place 2 sprays into the nose daily.         [name] Kitchen gabapentin (NEURONTIN) 300 MG capsule   Oral   Take 300 mg by mouth at bedtime.         [name] Kitchen HYDROcodone-acetaminophen (NORCO) 5-325 MG per tablet   Oral   Take 1 tablet by mouth every 6 (six) hours as needed for pain.   10 tablet   0   . insulin glargine (LANTUS) 100 UNIT/ML injection   Subcutaneous   Inject 40 Units into the skin at bedtime.         . insulin lispro (HUMALOG) 100 UNIT/ML injection   Subcutaneous   Inject 5 Units into the skin 3 (three) times daily before meals.         . Insulin Syringe-Needle U-100 (BD INSULIN SYRINGE ULTRAFINE) 31G X 5/16" 0.5 ML MISC      Use 3x a day as advised   100 each   1   . losartan (COZAAR) 100 MG tablet   Oral   Take 100 mg by mouth daily.         [name] Kitchen losartan (COZAAR) 100 MG tablet               . metFORMIN (GLUCOPHAGE) 500 MG tablet   Oral   Take 1 tablet (500 mg total) by mouth 2 (two) times daily with a meal.           !!!!!!!!!!!!!!!!!!!!!!!!!!!!!!!!!!!!!!!!!!!!!!!!!! ...   . metFORMIN (GLUCOPHAGE) 500 MG tablet               . Multiple Vitamin (MULTIVITAMIN WITH MINERALS) TABS tablet   Oral   Take 1 tablet by mouth daily.         [name] Kitchen NEXIUM 40 MG capsule               . nitroGLYCERIN (NITROSTAT) 0.4 MG SL tablet   Sublingual   Place 1 tablet (0.4 mg total) under the tongue every 5 (five) minutes as needed for chest pain (up to 3 doses).   25 tablet   2     IMPORTANT:  Do not take this medication if you have ...   . polyvinyl alcohol (LIQUIFILM TEARS) 1.4 % ophthalmic solution   Both Eyes   Place 1 drop into both eyes daily as needed (dry eyes).         [name] Kitchen sulfamethoxazole-trimethoprim (SEPTRA DS) 800-160 MG per tablet   Oral   Take 1 tablet by mouth every 12 (twelve) hours.   14 tablet   0   . tamsulosin (FLOMAX) 0.4 MG CAPS capsule               . tamsulosin (FLOMAX) 0.4 MG  CAPS   Oral   Take 0.4 mg by mouth daily after supper.          BP 117/67  Pulse 89  Temp(Src) 98.4 F (36.9 C) (Oral)  Resp 16  Ht 5\' 7"  (1.702 m)  Wt 163 lb (73.936 kg)  BMI 25.52 kg/m2  SpO2 99% Physical Exam  Nursing note and vitals reviewed. Constitutional: He is oriented to person, place, and time. He appears well-developed. No distress.  HENT:  Head: Normocephalic and atraumatic.  Eyes: Conjunctivae and EOM are normal.  Cardiovascular: Normal rate and regular rhythm.   Pulmonary/Chest: Effort normal. No stridor. No respiratory distress.  Abdominal: He exhibits no distension.  Musculoskeletal: He exhibits no edema.  Patient's gait is steady, he flexes both hips with equal strength, flexes and extends both knees with equal strength, has no significant tenderness to palpation about the right posterior hip, though there is mild discomfort.  Neurological: He is alert and oriented to person, place, and time.  Skin: Skin is warm and dry.  Psychiatric: He has a normal mood and affect.    ED Course  Procedures (including critical care time) Labs Review Labs Reviewed - No data to display Imaging Review Dg Lumbar Spine 2-3 Views  11/27/2012   *RADIOLOGY REPORT*  Clinical Data: Chronic back pain worse this morning, history of prostate cancer  LUMBAR SPINE - 2-3 VIEW  Comparison: 11/22/2012 CT scan  Findings: Normal alignment.  Moderate to severe degenerative disc disease at L4-5 with endplate irregularity, disc space narrowing, and large right lateral osteophyte.  Mild L3-4 degenerative disc disease.  Mild scoliosis.  Mild right and mild to moderate left sacroiliac joint degenerative changes.  IMPRESSION: Degenerative changes.   Original Report Authenticated By: Esperanza Heir, M.D.    On repeat exam the patient states he feels better. He, his wife and I had a lengthy discussion on the primary care followup, urology followup, orthopedics followup at the [facility]  No diagnosis  found. This patient with recently diagnosed prostate cancer presents now with persistent right lower back pain that radiates down the posterior of the leg.  Characteristically this is consistent with sciatica, though with the patient's history of malignancy, x-ray was performed to rule out lytic lesions.  This was negative, and the patient improved with analgesia here.  With this improvement, the absence of distress or neurologic loss of function, he was appropriate discharge with further evaluation as a outpatient    Gerhard Munch, MD 11/27/12 1528

## 2012-11-29 ENCOUNTER — Telehealth: Payer: Self-pay | Admitting: Family

## 2012-11-29 NOTE — Telephone Encounter (Signed)
Appointment scheduled for 12/03/12

## 2012-11-29 NOTE — Telephone Encounter (Signed)
Please call pt and arrange OV in next 1 week.

## 2012-11-30 ENCOUNTER — Encounter: Payer: Self-pay | Admitting: Nurse Practitioner

## 2012-11-30 ENCOUNTER — Ambulatory Visit (INDEPENDENT_AMBULATORY_CARE_PROVIDER_SITE_OTHER): Payer: Medicare Other | Admitting: Nurse Practitioner

## 2012-11-30 VITALS — BP 120/70 | HR 112 | Ht 67.0 in | Wt 163.0 lb

## 2012-11-30 DIAGNOSIS — Z01818 Encounter for other preprocedural examination: Secondary | ICD-10-CM

## 2012-11-30 DIAGNOSIS — I259 Chronic ischemic heart disease, unspecified: Secondary | ICD-10-CM

## 2012-11-30 NOTE — Patient Instructions (Addendum)
I will send Dr. Isabel Caprice a note regarding your cardiac status  It is not a good idea to take your insulin and then not eat properly!!!  Stay on your current medicines for now  Let us know if you have any chest pain or problems with your heart  Call the [facility] office at [phone] if you have any questions, problems or concerns.

## 2012-11-30 NOTE — Progress Notes (Addendum)
[name] Date of Birth: [birth year] Medical Record #[id number]  History of Present Illness: Mr. Offord is seen back today for a post hospital visit/surgical clearance visit. Seen for Dr. [place]. Has known CAD with moderate borderline CAD in the midLAD/small DX, mild RCA stenosis from cath in July of 2014 - to manage medically. Other issues include HLD, fatty liver, AVM's of the duodenum, BPH, prostate cancer, past stroke (August 2014), diabetes with neuropathy, sleep apnea, anemia, CKD, HTN and GERD with past GI bleed.   Recently in the ER with back pain - had just been diagnosed with prostate cancer. For possible robotic prostatectomy with lymph node dissection in November with Dr. Isabel Caprice.   Comes here today. He is here alone. His history is a little hard to follow. Looks like he was admitted back in July with chest pain and was cathed - will be managed medically in light of multiple problems. On aggrenox for prior stroke (from August). He denies any chest pain. No more bleeding reported. Sugars still uncontrolled. He took his insulin this morning and really did not eat. Now feels hot and hypoglycemic. Not short of breath. Has to see several providers before proceeding on with prostate surgery. Sees neurology tomorrow. Sees PCP later this week. No more bleeding reported. Still with back pain.   Current Outpatient Prescriptions  Medication Sig Dispense Refill  . atorvastatin (LIPITOR) 80 MG tablet Take 1 tablet (80 mg total) by mouth daily.  30 tablet  2  . carvedilol (COREG) 3.125 MG tablet Take 1 tablet (3.125 mg total) by mouth 2 (two) times daily with a meal.  30 tablet  2  . Cholecalciferol (VITAMIN D) 2000 UNITS CAPS Take 2,000 Units by mouth daily.      . ciprofloxacin (CIPRO) 500 MG tablet       . diazepam (VALIUM) 5 MG tablet Take 1 tablet (5 mg total) by mouth 2 (two) times daily.  10 tablet  0  . dipyridamole-aspirin (AGGRENOX) 200-25 MG per 12 hr capsule Take 1 capsule by mouth 2  (two) times daily.  60 capsule  2  . esomeprazole (NEXIUM) 40 MG capsule Take 40 mg by mouth 2 (two) times daily.      . fluticasone (FLONASE) 50 MCG/ACT nasal spray Place 2 sprays into the nose daily.      [name] Kitchen gabapentin (NEURONTIN) 300 MG capsule Take 300 mg by mouth at bedtime.      . insulin glargine (LANTUS) 100 UNIT/ML injection Inject 40 Units into the skin at bedtime.      . insulin lispro (HUMALOG) 100 UNIT/ML injection Inject 5 Units into the skin 3 (three) times daily before meals.      . Insulin Syringe-Needle U-100 (BD INSULIN SYRINGE ULTRAFINE) 31G X 5/16" 0.5 ML MISC Use 3x a day as advised  100 each  1  . losartan (COZAAR) 100 MG tablet Take 100 mg by mouth daily.      . metFORMIN (GLUCOPHAGE) 500 MG tablet Take 1 tablet (500 mg total) by mouth 2 (two) times daily with a meal.      . Multiple Vitamin (MULTIVITAMIN WITH MINERALS) TABS tablet Take 1 tablet by mouth daily.      [name] Kitchen NEXIUM 40 MG capsule       . nitroGLYCERIN (NITROSTAT) 0.4 MG SL tablet Place 1 tablet (0.4 mg total) under the tongue every 5 (five) minutes as needed for chest pain (up to 3 doses).  25 tablet  2  . oxyCODONE-acetaminophen (  PERCOCET/ROXICET) 5-325 MG per tablet Take 1-2 tablets by mouth every 6 (six) hours as needed for pain.  15 tablet  0  . polyvinyl alcohol (LIQUIFILM TEARS) 1.4 % ophthalmic solution Place 1 drop into both eyes daily as needed (dry eyes).      [name] Kitchen sulfamethoxazole-trimethoprim (SEPTRA DS) 800-160 MG per tablet Take 1 tablet by mouth every 12 (twelve) hours.  14 tablet  0  . tamsulosin (FLOMAX) 0.4 MG CAPS Take 0.4 mg by mouth daily after supper.       No current facility-administered medications for this visit.    No Known Allergies  Past Medical History  Diagnosis Date  . Hypertension   . High cholesterol   . GERD (gastroesophageal reflux disease)   . GI bleed     a. Recurrent GI bleed, tx with IV iron. b. Per heme notes - likely AVMs 04/2012 (tx with cauterization several months  ago).  . Orthostatic hypotension     a. Tx with florinef.  . Hematuria     a. Urology note scan from 07/2012: cystoscopy without evidence for bladder lesion, only lateral hypertrophy of posterior urethra, bladder impression from BPH. b. Pt states he had "some tests" scheduled for later in July 2014.  . Stage III chronic kidney disease     a. Stage 3 (DM with complications ->CKD, peripheral neuropathy).  . Anemia, iron deficiency 03/24/2011    a. Recurrent GI bleed, tx with periodic iron infusions.  . 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 Sleep apnea     "had mask; couldn't sleep in it" (09/20/2012)  . Type II diabetes mellitus     a. Dx 1994, uncontrolled.   . Diabetic peripheral neuropathy 2014    foot pain.  . Chronic lower back pain   . Fatty liver     on CT of 11/2010  . AVM (arteriovenous malformation) of duodenum, acquired     egds in 01/2012, 04/2011  . Polyp, colonic     Colonoscopy 01/2012 "benign" polyp  . CAD (coronary artery disease)     a. Cath 09/2012: moderate borderline CAD in mid LAD/small diagonal branch, mild RCA stenosis, to be managed medically   . BPH (benign prostatic hyperplasia)   . Prostate cancer   . Stroke     Past Surgical History  Procedure Laterality Date  . Shoulder open rotator cuff repair Left 1980's  . Inguinal hernia repair Right 2011  . Lumbar disc surgery  1980's    History  Smoking status  . Former Smoker -- 1.00 packs/day for 48 years  . Types: Cigarettes  . Quit date: 03/17/2012  Smokeless tobacco  . Never Used    History  Alcohol Use No    Comment: 09/20/2012 "Used to; stopped ~ 2009; never had problem w/it"    Family History  Problem Relation Age of Onset  . Heart attack Brother     Died at 55  . Stroke Father     Died at 20  . Diabetes Sister   . Diabetes Mother   . Stomach cancer Brother   . Heart attack Sister     Review of Systems: The review of systems is per the HPI.  All other systems were reviewed and are  negative.  Physical Exam: BP 120/70  Pulse 112  Ht 5\' 7"  (1.702 m)  Wt 163 lb (73.936 kg)  BMI 25.52 kg/m2 Patient is alert and in no acute distress. Skin is warm and dry. Color is normal.  HEENT is unremarkable. Normocephalic/atraumatic. PERRL. Sclera are nonicteric. Neck is supple. No masses. No JVD. Lungs are clear. Cardiac exam shows a regular rate and rhythm. He was a little tachycardic at the beginning of the visit. EKG shows slowing down - after tretament of his blood sugar. Abdomen is soft. Extremities are without edema. Gait and ROM are intact. No gross neurologic deficits noted.  LABORATORY DATA: Blood sugar was 289 after juice and candy was given.   Lab Results  Component Value Date   WBC 6.4 11/22/2012   HGB 9.0* 11/22/2012   HCT 26.2* 11/22/2012   PLT 279 11/22/2012   GLUCOSE 339* 11/22/2012   CHOL 153 10/31/2012   TRIG 203* 10/31/2012   HDL 29* 10/31/2012   LDLCALC 83 10/31/2012   ALT 13 11/01/2012   AST 19 11/01/2012   NA 136 11/22/2012   K 4.0 11/22/2012   CL 100 11/22/2012   CREATININE 1.20 11/22/2012   BUN 19 11/22/2012   CO2 28 11/22/2012   TSH 0.899 09/20/2012   INR 0.94 09/20/2012   HGBA1C 9.5* 10/31/2012   MICROALBUR >600.00* 05/24/2012   Lab Results  Component Value Date   CKTOTAL 111 07/15/2011   CKMB 1.7 07/15/2011   TROPONINI <0.30 10/31/2012    Coronary angiography:  Coronary dominance: right  Left mainstem: Normal  Left anterior descending (LAD): Focal 50-70% mid LAD stenosis. Small diagonal with diffuse 70% proximal disease.  Ramus intermediate is a large branch with focal 40-50% disease in the proximal vessel.  Left circumflex (LCx): minor wall irregularities.  Right coronary artery (RCA): 30% mid vessel.  Left ventriculography: Left ventricular systolic function is normal, LVEF is estimated at 55-65%, there is no significant mitral regurgitation  Final Conclusions:  1. 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  2. Normal LV function.    Recommendations: Medical management especially in light of his history of recurrent GI bleeds. Will hydrate. Repeat CBC in am.  Theron Arista [facility]  09/21/2012, 11:22 AM   Assessment / Plan: 1. CAD - moderate borderline obstructive CAD per recent cath - currently without any symptoms. Would continue with medical management.   2. Anemia - this is chronic  3. Recent stroke - seeing neurology tomorrow - they would be the one to decide about stopping his blood thinner prior to surgery - patient is currently on aggrenox  4. Prostate cancer - needs surgery in November - He would be at increased risk just in light of all of his medical issues combined. Currently without cardiac complaints. Would continue with medical management of his CAD. Would be available if problems arise.   5. DM - uncontrolled.  Patient is agreeable to this plan and will call if any problems develop in the interim.   Rosalio Macadamia, RN, ANP-C [facility] [address] [place], [place]  [address]

## 2012-12-01 ENCOUNTER — Telehealth: Payer: Self-pay | Admitting: Family

## 2012-12-01 NOTE — Telephone Encounter (Signed)
Patient already has an appointment for 12/03/12 and the appointment is 30 minutes.

## 2012-12-01 NOTE — Telephone Encounter (Signed)
Please call Mr. Tones and let him know that I would like to see him in the office in the next 2 weeks to complete surgical clearance for his upcoming prostatectomy.  Please book 30 min slot.

## 2012-12-03 ENCOUNTER — Ambulatory Visit (INDEPENDENT_AMBULATORY_CARE_PROVIDER_SITE_OTHER): Payer: Medicare Other | Admitting: Family

## 2012-12-03 ENCOUNTER — Ambulatory Visit (HOSPITAL_BASED_OUTPATIENT_CLINIC_OR_DEPARTMENT_OTHER)
Admission: RE | Admit: 2012-12-03 | Discharge: 2012-12-03 | Disposition: A | Discharge status: Home / Self Care | Payer: Medicare Other | Source: Ambulatory Visit | Attending: Family | Admitting: Family

## 2012-12-03 ENCOUNTER — Encounter: Payer: Self-pay | Admitting: Family

## 2012-12-03 VITALS — BP 138/84 | HR 107 | Temp 97.6°F | Resp 16 | Ht 67.0 in | Wt 161.0 lb

## 2012-12-03 DIAGNOSIS — I6350 Cerebral infarction due to unspecified occlusion or stenosis of unspecified cerebral artery: Secondary | ICD-10-CM

## 2012-12-03 DIAGNOSIS — Z23 Encounter for immunization: Secondary | ICD-10-CM

## 2012-12-03 DIAGNOSIS — I10 Essential (primary) hypertension: Secondary | ICD-10-CM | POA: Insufficient documentation

## 2012-12-03 DIAGNOSIS — D649 Anemia, unspecified: Secondary | ICD-10-CM

## 2012-12-03 DIAGNOSIS — R490 Dysphonia: Secondary | ICD-10-CM

## 2012-12-03 DIAGNOSIS — Z01810 Encounter for preprocedural cardiovascular examination: Secondary | ICD-10-CM

## 2012-12-03 DIAGNOSIS — M545 Low back pain: Secondary | ICD-10-CM

## 2012-12-03 DIAGNOSIS — Z8673 Personal history of transient ischemic attack (TIA), and cerebral infarction without residual deficits: Secondary | ICD-10-CM

## 2012-12-03 DIAGNOSIS — C61 Malignant neoplasm of prostate: Secondary | ICD-10-CM

## 2012-12-03 DIAGNOSIS — I639 Cerebral infarction, unspecified: Secondary | ICD-10-CM

## 2012-12-03 DIAGNOSIS — G459 Transient cerebral ischemic attack, unspecified: Secondary | ICD-10-CM

## 2012-12-03 DIAGNOSIS — Z01818 Encounter for other preprocedural examination: Secondary | ICD-10-CM

## 2012-12-03 LAB — CBC WITH DIFFERENTIAL/PLATELET
Basophils Absolute: 0 10*3/uL (ref 0.0–0.1)
Eosinophils Relative: 3 % (ref 0–5)
HCT: 29.2 % — ABNORMAL LOW (ref 39.0–52.0)
Hemoglobin: 9.5 g/dL — ABNORMAL LOW (ref 13.0–17.0)
Lymphocytes Relative: 19 % (ref 12–46)
Lymphs Abs: 0.7 10*3/uL (ref 0.7–4.0)
MCV: 89.8 fL (ref 78.0–100.0)
Monocytes Absolute: 0.7 10*3/uL (ref 0.1–1.0)
Neutro Abs: 2.2 10*3/uL (ref 1.7–7.7)
RBC: 3.25 MIL/uL — ABNORMAL LOW (ref 4.22–5.81)
RDW: 15.1 % (ref 11.5–15.5)
WBC: 3.8 10*3/uL — ABNORMAL LOW (ref 4.0–10.5)

## 2012-12-03 LAB — PROTIME-INR: INR: 0.97 (ref <1.50)

## 2012-12-03 LAB — BASIC METABOLIC PANEL
BUN: 23 mg/dL (ref 6–23)
CO2: 28 mEq/L (ref 19–32)
Chloride: 98 mEq/L (ref 96–112)
Creat: 1.7 mg/dL — ABNORMAL HIGH (ref 0.50–1.35)
Potassium: 5.4 mEq/L — ABNORMAL HIGH (ref 3.5–5.3)

## 2012-12-03 NOTE — Progress Notes (Signed)
I have reviewed and agree with above changes. Durenda Hurt Renaldo Fiddler, [facility] Pager [phone]

## 2012-12-03 NOTE — Patient Instructions (Addendum)
Please complete lab work prior to leaving, follow up in 1 month. Complete chest x ray on the first floor.

## 2012-12-03 NOTE — Assessment & Plan Note (Signed)
This has worsened.  He has smoking hx. Will refer to ENT for further evaluation. I have also asked the pt to speak with ENT about the ear problems he has been having.

## 2012-12-03 NOTE — Assessment & Plan Note (Signed)
As part of his pre-surgical evaluation, will send urine for culture, check bmet, PT/INR, follow up CBC, CXR.  He will need his hemoglobin to be optimized prior to his upcoming surgery.

## 2012-12-03 NOTE — Progress Notes (Signed)
Subjective:    Patient ID: [name], male    DOB: [birth year], 67 y.o.   MRN: [id number]  HPI  Mr. Brault is a 67 yr old male who presents today for pre-operative evaluation and for follow up.   1) Prostate Cancer-  Pt presents today for pre-operative evaluation for prostatectomy.  2) Low back pain- had x ray which showed degenerative changes of the lower back. He reports associated bilateral LE weakness. Reports pain is unchanged.  3) reports "echoing" in both ears.  Reports that this has been going on for some time.   4) Anemia- seeing Dr. Myna Hidalgo for iron infusion.  Reports last infusion was about 1 month ago. Last week it was noted that his hemoglobin was down to 9.0 from 10.9 one month ago.  5) CAD- he recently saw cardiology and it is recommended that he continue his medical management.    6) CVA- reports that he has been having trouble tolerating the bid dosing of the aggrenox due to HA, despite use of tylenol.    Review of Systems  HENT: Positive for tinnitus. Negative for congestion.   Respiratory: Negative for cough and shortness of breath.   Cardiovascular: Negative for chest pain and leg swelling.  Gastrointestinal: Negative for vomiting, diarrhea and constipation.  Genitourinary: Negative for dysuria and frequency.       Denies urinary problems.  Musculoskeletal: Positive for back pain.  Skin:       Reports some dry skin on elbows, knees- improves with lotions.  Neurological:       + headaches with aggrenox  Hematological: Negative for adenopathy.      see HPI  Past Medical History  Diagnosis Date  . Hypertension   . High cholesterol   . GERD (gastroesophageal reflux disease)   . GI bleed     a. Recurrent GI bleed, tx with IV iron. b. Per heme notes - likely AVMs 04/2012 (tx with cauterization several months ago).  . Orthostatic hypotension     a. Tx with florinef.  . Hematuria     a. Urology note scan from 07/2012: cystoscopy without evidence for bladder  lesion, only lateral hypertrophy of posterior urethra, bladder impression from BPH. b. Pt states he had "some tests" scheduled for later in July 2014.  . Stage III chronic kidney disease     a. Stage 3 (DM with complications ->CKD, peripheral neuropathy).  . Anemia, iron deficiency 03/24/2011    a. Recurrent GI bleed, tx with periodic iron infusions.  . 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 Sleep apnea     "had mask; couldn't sleep in it" (09/20/2012)  . Type II diabetes mellitus     a. Dx 1994, uncontrolled.   . Diabetic peripheral neuropathy 2014    foot pain.  . Chronic lower back pain   . Fatty liver     on CT of 11/2010  . AVM (arteriovenous malformation) of duodenum, acquired     egds in 01/2012, 04/2011  . Polyp, colonic     Colonoscopy 01/2012 "benign" polyp  . CAD (coronary artery disease)     a. Cath 09/2012: moderate borderline CAD in mid LAD/small diagonal branch, mild RCA stenosis, to be managed medically   . BPH (benign prostatic hyperplasia)   . Prostate cancer   . Stroke     History   Social History  . Marital Status: Married    Spouse Name: N/A    Number of Children: 2  . Years  of Education: N/A   Occupational History  . taken out of work due to back    Social History Main Topics  . Smoking status: Former Smoker -- 1.00 packs/day for 48 years    Types: Cigarettes    Quit date: 03/17/2012  . Smokeless tobacco: Never Used  . Alcohol Use: No     Comment: 09/20/2012 "Used to; stopped ~ 2009; never had problem w/it"  . Drug Use: No  . Sexual Activity: No   Other Topics Concern  . Not on file   Social History Narrative  . No narrative on file    Past Surgical History  Procedure Laterality Date  . Shoulder open rotator cuff repair Left 1980's  . Inguinal hernia repair Right 2011  . Lumbar disc surgery  1980's    Family History  Problem Relation Age of Onset  . Heart attack Brother     Died at 15  . Stroke Father     Died at 88  . Diabetes Sister   .  Diabetes Mother   . Stomach cancer Brother   . Heart attack Sister     No Known Allergies  Current Outpatient Prescriptions on File Prior to Visit  Medication Sig Dispense Refill  . atorvastatin (LIPITOR) 80 MG tablet Take 1 tablet (80 mg total) by mouth daily.  30 tablet  2  . carvedilol (COREG) 3.125 MG tablet Take 1 tablet (3.125 mg total) by mouth 2 (two) times daily with a meal.  30 tablet  2  . Cholecalciferol (VITAMIN D) 2000 UNITS CAPS Take 2,000 Units by mouth daily.      . ciprofloxacin (CIPRO) 500 MG tablet       . dipyridamole-aspirin (AGGRENOX) 200-25 MG per 12 hr capsule Take 1 capsule by mouth 2 (two) times daily.  60 capsule  2  . esomeprazole (NEXIUM) 40 MG capsule Take 40 mg by mouth 2 (two) times daily.      . fluticasone (FLONASE) 50 MCG/ACT nasal spray Place 2 sprays into the nose daily.      [name] Kitchen gabapentin (NEURONTIN) 300 MG capsule Take 300 mg by mouth at bedtime.      . insulin glargine (LANTUS) 100 UNIT/ML injection Inject 40 Units into the skin at bedtime.      . insulin lispro (HUMALOG) 100 UNIT/ML injection Inject 5 Units into the skin 3 (three) times daily before meals.      . Insulin Syringe-Needle U-100 (BD INSULIN SYRINGE ULTRAFINE) 31G X 5/16" 0.5 ML MISC Use 3x a day as advised  100 each  1  . losartan (COZAAR) 100 MG tablet Take 100 mg by mouth daily.      . metFORMIN (GLUCOPHAGE) 500 MG tablet Take 1 tablet (500 mg total) by mouth 2 (two) times daily with a meal.      . Multiple Vitamin (MULTIVITAMIN WITH MINERALS) TABS tablet Take 1 tablet by mouth daily.      [name] Kitchen NEXIUM 40 MG capsule       . nitroGLYCERIN (NITROSTAT) 0.4 MG SL tablet Place 1 tablet (0.4 mg total) under the tongue every 5 (five) minutes as needed for chest pain (up to 3 doses).  25 tablet  2  . oxyCODONE-acetaminophen (PERCOCET/ROXICET) 5-325 MG per tablet Take 1-2 tablets by mouth every 6 (six) hours as needed for pain.  15 tablet  0  . polyvinyl alcohol (LIQUIFILM TEARS) 1.4 % ophthalmic  solution Place 1 drop into both eyes daily as needed (dry eyes).      [name] Kitchen  sulfamethoxazole-trimethoprim (SEPTRA DS) 800-160 MG per tablet Take 1 tablet by mouth every 12 (twelve) hours.  14 tablet  0  . tamsulosin (FLOMAX) 0.4 MG CAPS Take 0.4 mg by mouth daily after supper.       No current facility-administered medications on file prior to visit.    BP 138/84  Pulse 107  Temp(Src) 97.6 F (36.4 C) (Oral)  Resp 16  Ht 5\' 7"  (1.702 m)  Wt 161 lb 0.6 oz (73.047 kg)  BMI 25.22 kg/m2  SpO2 97%    Objective:   Physical Exam  Constitutional: He is oriented to person, place, and time. He appears well-developed and well-nourished. No distress.  HENT:  Head: Normocephalic and atraumatic.  Right Ear: Tympanic membrane and ear canal normal.  Left Ear: Tympanic membrane and ear canal normal.  Mouth/Throat: No oropharyngeal exudate, posterior oropharyngeal edema or posterior oropharyngeal erythema.  Cardiovascular: Normal rate and regular rhythm.   No murmur heard. Pulmonary/Chest: Effort normal and breath sounds normal. No respiratory distress. He has no wheezes. He has no rales. He exhibits no tenderness.  Musculoskeletal: He exhibits no edema.  Lymphadenopathy:    He has no cervical adenopathy.  Neurological: He is alert and oriented to person, place, and time.  Psychiatric: He has a normal mood and affect. His behavior is normal. Judgment and thought content normal.          Assessment & Plan:

## 2012-12-03 NOTE — Assessment & Plan Note (Signed)
Clinically stable. Advised pt to premedicate with tylenol 1 hour prior to aggrenox dose.  If unable to tolerate with this regimen, may need to consider another agent such as xarelto.

## 2012-12-04 ENCOUNTER — Ambulatory Visit (HOSPITAL_BASED_OUTPATIENT_CLINIC_OR_DEPARTMENT_OTHER)
Admission: RE | Admit: 2012-12-04 | Discharge: 2012-12-04 | Disposition: A | Discharge status: Home / Self Care | Payer: Medicare Other | Source: Ambulatory Visit | Attending: Family | Admitting: Family

## 2012-12-04 DIAGNOSIS — M545 Low back pain: Secondary | ICD-10-CM

## 2012-12-04 DIAGNOSIS — M5137 Other intervertebral disc degeneration, lumbosacral region: Secondary | ICD-10-CM | POA: Insufficient documentation

## 2012-12-04 DIAGNOSIS — M51379 Other intervertebral disc degeneration, lumbosacral region without mention of lumbar back pain or lower extremity pain: Secondary | ICD-10-CM | POA: Insufficient documentation

## 2012-12-04 DIAGNOSIS — M79609 Pain in unspecified limb: Secondary | ICD-10-CM | POA: Insufficient documentation

## 2012-12-04 DIAGNOSIS — C61 Malignant neoplasm of prostate: Secondary | ICD-10-CM | POA: Insufficient documentation

## 2012-12-04 LAB — CULTURE, URINE COMPREHENSIVE: Colony Count: NO GROWTH

## 2012-12-05 ENCOUNTER — Telehealth: Payer: Self-pay | Admitting: Family

## 2012-12-05 DIAGNOSIS — E875 Hyperkalemia: Secondary | ICD-10-CM

## 2012-12-05 DIAGNOSIS — IMO0002 Reserved for concepts with insufficient information to code with codable children: Secondary | ICD-10-CM

## 2012-12-05 NOTE — Telephone Encounter (Addendum)
Please call pt and let him know that his blood count remains low but slightly improved.    I would like for him to keep his upcoming appointment with Dr. Myna Hidalgo for infusion.  MRI shows bulging discs in his back.  I will arrange consult with neurosurgeon for further evaluation.  I would like him to return to the lab Monday or Tuesday to repeat abnormal BMET- dx is hyperkalemia.    Sugar is very poorly controlled. Was 460 the day that we checked. Pt should recheck his sugar today and call us if sugar >350.   I would like him to record his sugars 3 times a day record, follow up in 1 week for office visit- bring these readings with him.  Bring all his insulin and his glucose meter to this visit.

## 2012-12-07 NOTE — Telephone Encounter (Signed)
Pt called back stating current glucometer reading is "Hi". Per pt meter this indicates BS is >600. Per verbal from Provider, take 10 units of Humalog and recheck BS in 1 hour. If reading is still "Hi" he should be evaluated in the ER. Pt voices understanding and states his reading has been "HI" x 3 days.

## 2012-12-07 NOTE — Addendum Note (Signed)
Addended by: Mervin Kung A on: 12/07/2012 04:52 PM   Modules accepted: Orders

## 2012-12-07 NOTE — Telephone Encounter (Signed)
Notified pt's wife and she voices understanding. Follow up scheduled for 12/14/12 at 8:15am.

## 2012-12-08 ENCOUNTER — Telehealth: Payer: Self-pay | Admitting: Hematology & Oncology

## 2012-12-08 ENCOUNTER — Telehealth: Payer: Self-pay | Admitting: Family

## 2012-12-08 NOTE — Telephone Encounter (Signed)
Would you please show Dr. Myna Hidalgo copy of Mr. Crisci' MRI L spine as well and let me know what he thinks?  I meant to discuss this as well with him. Thanks.

## 2012-12-08 NOTE — Telephone Encounter (Signed)
Pt wants to see MD transferred to RN to triage

## 2012-12-08 NOTE — Telephone Encounter (Signed)
Received message from pt's wife at 4:11pm that pt was feeling very bad today and to please call. Spoke with pt. Current BS reading is 242. Pt complains of weakness of both legs, "can hardly walk". Continues to have "pinsticking sensation in right side.  Pt reports that he has appt with hematology tomorrow for an infusion. Spoke with Provider and advised pt to be evaluated in the ER tonight. Pt voices understanding and states his wife can transport him.

## 2012-12-09 ENCOUNTER — Ambulatory Visit: Payer: Medicare Other

## 2012-12-09 ENCOUNTER — Other Ambulatory Visit (HOSPITAL_BASED_OUTPATIENT_CLINIC_OR_DEPARTMENT_OTHER): Payer: Medicare Other | Admitting: Lab

## 2012-12-09 ENCOUNTER — Ambulatory Visit (HOSPITAL_BASED_OUTPATIENT_CLINIC_OR_DEPARTMENT_OTHER): Payer: Medicare Other

## 2012-12-09 VITALS — BP 123/80 | HR 114 | Temp 96.8°F | Resp 20

## 2012-12-09 DIAGNOSIS — D509 Iron deficiency anemia, unspecified: Secondary | ICD-10-CM

## 2012-12-09 DIAGNOSIS — E1165 Type 2 diabetes mellitus with hyperglycemia: Secondary | ICD-10-CM

## 2012-12-09 DIAGNOSIS — D631 Anemia in chronic kidney disease: Secondary | ICD-10-CM

## 2012-12-09 DIAGNOSIS — N189 Chronic kidney disease, unspecified: Secondary | ICD-10-CM

## 2012-12-09 LAB — CBC WITH DIFFERENTIAL (CANCER CENTER ONLY)
BASO#: 0 10*3/uL (ref 0.0–0.2)
Eosinophils Absolute: 0.2 10*3/uL (ref 0.0–0.5)
HGB: 9.2 g/dL — ABNORMAL LOW (ref 13.0–17.1)
MONO#: 0.8 10*3/uL (ref 0.1–0.9)
NEUT#: 2.5 10*3/uL (ref 1.5–6.5)
Platelets: 403 10*3/uL — ABNORMAL HIGH (ref 145–400)
RBC: 3.16 10*6/uL — ABNORMAL LOW (ref 4.20–5.70)
WBC: 4.4 10*3/uL (ref 4.0–10.0)

## 2012-12-09 LAB — BASIC METABOLIC PANEL - CANCER CENTER ONLY
BUN, Bld: 23 mg/dL — ABNORMAL HIGH (ref 7–22)
CO2: 29 mEq/L (ref 18–33)
Chloride: 101 mEq/L (ref 98–108)
Glucose, Bld: 338 mg/dL — ABNORMAL HIGH (ref 73–118)
Potassium: 4.4 mEq/L (ref 3.3–4.7)
Sodium: 140 mEq/L (ref 128–145)

## 2012-12-09 MED ORDER — INSULIN REGULAR HUMAN 100 UNIT/ML IJ SOLN
15.0000 [IU] | Freq: Once | INTRAMUSCULAR | Status: AC
  Administered 2012-12-09: 15 [IU] via SUBCUTANEOUS

## 2012-12-09 MED ORDER — SODIUM CHLORIDE 0.9 % IV SOLN
Freq: Once | INTRAVENOUS | Status: AC
  Administered 2012-12-09: 10:00:00 via INTRAVENOUS

## 2012-12-09 MED ORDER — DARBEPOETIN ALFA-POLYSORBATE 300 MCG/0.6ML IJ SOLN
300.0000 ug | Freq: Once | INTRAMUSCULAR | Status: AC
  Administered 2012-12-09: 300 ug via SUBCUTANEOUS

## 2012-12-09 MED ORDER — DARBEPOETIN ALFA-POLYSORBATE 300 MCG/0.6ML IJ SOLN
INTRAMUSCULAR | Status: AC
  Filled 2012-12-09: qty 0.6

## 2012-12-09 MED ORDER — SODIUM CHLORIDE 0.9 % IV SOLN
1020.0000 mg | Freq: Once | INTRAVENOUS | Status: AC
  Administered 2012-12-09: 1020 mg via INTRAVENOUS
  Filled 2012-12-09: qty 34

## 2012-12-09 NOTE — Patient Instructions (Addendum)
Ferumoxytol injection What is this medicine? FERUMOXYTOL is an iron complex. Iron is used to make healthy red blood cells, which carry oxygen and nutrients throughout the body. This medicine is used to treat iron deficiency anemia in people with chronic kidney disease. This medicine may be used for other purposes; ask your health care provider or pharmacist if you have questions. What should I tell my health care provider before I take this medicine? They need to know if you have any of these conditions: -anemia not caused by low iron levels -high levels of iron in the blood -magnetic resonance imaging (MRI) test scheduled -an unusual or allergic reaction to iron, other medicines, foods, dyes, or preservatives -pregnant or trying to get pregnant -breast-feeding How should I use this medicine? This medicine is for infusion into a vein. It is given by a health care professional in a hospital or clinic setting. Talk to your pediatrician regarding the use of this medicine in children. Special care may be needed. Overdosage: If you think you've taken too much of this medicine contact a poison control center or emergency room at once. Overdosage: If you think you have taken too much of this medicine contact a poison control center or emergency room at once. NOTE: This medicine is only for you. Do not share this medicine with others. What if I miss a dose? It is important not to miss your dose. Call your doctor or health care professional if you are unable to keep an appointment. What may interact with this medicine? This medicine may interact with the following medications: -other iron products This list may not describe all possible interactions. Give your health care provider a list of all the medicines, herbs, non-prescription drugs, or dietary supplements you use. Also tell them if you smoke, drink alcohol, or use illegal drugs. Some items may interact with your medicine. What should I watch  for while using this medicine? Visit your doctor or healthcare professional regularly. Tell your doctor or healthcare professional if your symptoms do not start to get better or if they get worse. You may need blood work done while you are taking this medicine. You may need to follow a special diet. Talk to your doctor. Foods that contain iron include: whole grains/cereals, dried fruits, beans, or peas, leafy green vegetables, and organ meats (liver, kidney). What side effects may I notice from receiving this medicine? Side effects that you should report to your doctor or health care professional as soon as possible: -allergic reactions like skin rash, itching or hives, swelling of the face, lips, or tongue -breathing problems -changes in blood pressure -feeling faint or lightheaded, falls -fever or chills -flushing, sweating, or hot feelings -swelling of the ankles or feet Side effects that usually do not require medical attention (Report these to your doctor or health care professional if they continue or are bothersome.): -diarrhea -headache -nausea, vomiting -stomach pain This list may not describe all possible side effects. Call your doctor for medical advice about side effects. You may report side effects to FDA at 1-800-FDA-1088. Where should I keep my medicine? This drug is given in a hospital or clinic and will not be stored at home. NOTE: This sheet is a summary. It may not cover all possible information. If you have questions about this medicine, talk to your doctor, pharmacist, or health care provider.  2013, [employer] Standard. (11/24/2007 9:48:25 PM) Regular Insulin injection What is this medicine? REGULAR INSULIN (REG yuh ler IN su lin) is a  human-made form of insulin. This medicine lowers the amount of sugar in your blood. It is a short-acting insulin that starts working about 30 minutes after it is injected. This medicine may be used for other purposes; ask your health  care provider or pharmacist if you have questions. What should I tell my health care provider before I take this medicine? They need to know if you have any of these conditions: -episodes of hypoglycemia -kidney disease -liver disease -an unusual or allergic reaction to insulin, metacresol, other medicines, foods, dyes, or preservatives -pregnant or trying to get pregnant -breast-feeding How should I use this medicine? This medicine is for injection under the skin. Use exactly as directed. It is important to follow the directions given to you by your doctor or health care professional. Your doctor or health care professional will tell you how long to wait after you inject your dose before eating a meal. Most of the time, you should wait about 5 to 10 minutes or less. You will be taught how to use this medicine and how to adjust doses for activities and illness. Do not use more insulin than prescribed. Do not use more or less often than prescribed. Always check the appearance of your insulin before using it. This medicine should be clear and colorless like water. Do not use it if it is cloudy, thickened, colored, or has solid particles in it. It is important that you put your used needles and syringes in a special sharps container. Do not put them in a trash can. If you do not have a sharps container, call your pharmacist or healthcare provider to get one. Talk to your pediatrician regarding the use of this medicine in children. While this medicine may be prescribed for children for selected conditions, precautions do apply. Overdosage: If you think you have taken too much of this medicine contact a poison control center or emergency room at once. NOTE: This medicine is only for you. Do not share this medicine with others. What if I miss a dose? It is important not to miss a dose. Your health care professional or doctor should discuss a plan for missed doses with you. If you do miss a dose, follow  their plan. Do not take double doses. What may interact with this medicine? -other medicines for diabetes Many medications may cause an increase or decrease in blood sugar, these include: -alcohol containing beverages -aspirin and aspirin-like drugs -chloramphenicol -chromium -diuretics -male hormones, like estrogens or progestins and birth control pills -heart medicines -isoniazid -male hormones or anabolic steroids -medicines for weight loss -medicines for allergies, asthma, cold, or cough -medicines for mental problems -medicines called MAO Inhibitors like Nardil, Parnate, Marplan, Eldepryl -niacin -NSAIDs, medicines for pain and inflammation, like ibuprofen or naproxen -pentamidine -phenytoin -probenecid -quinolone antibiotics like ciprofloxacin, levofloxacin, ofloxacin -some herbal dietary supplements -steroid medicines like prednisone or cortisone -thyroid medicine Some medications can hide the warning symptoms of low blood sugar. You may need to monitor your blood sugar more closely if you are taking one of these medications. These include: -beta-blockers such as atenolol, metoprolol, propranolol -clonidine -guanethidine -reserpine This list may not describe all possible interactions. Give your health care provider a list of all the medicines, herbs, non-prescription drugs, or dietary supplements you use. Also tell them if you smoke, drink alcohol, or use illegal drugs. Some items may interact with your medicine. What should I watch for while using this medicine? Visit your health care professional or doctor for regular checks  on your progress. To control your diabetes you must use this medicine regularly and follow a diet and exercise schedule. Checking and recording your blood sugar and urine ketone levels regularly is important. Use a blood sugar measuring device before you treat high or low blood sugar. Always carry a quick-source of sugar with you in case you have  symptoms of low blood sugar. Examples include hard sugar candy or glucose tablets. Make sure family members know that you can choke if you eat or drink when you develop serious symptoms of low blood sugar, such as seizures or unconsciousness. They must get medical help at once. Make sure that you have the right kind of syringe for the type of insulin you use. Try not to change the brand and type of insulin or syringe unless your health care professional or doctor tells you to. Switching insulin brand or type can cause dangerously high or low blood sugar. Always keep an extra supply of insulin, syringes, and needles on hand. Use a syringe one time only. Throw away syringe and needle in a closed container to prevent accidental needle sticks. Insulin pens and cartridges should never be shared. Sharing may result in passing of viruses like hepatitis or HIV. Wear a medical identification bracelet or chain to say you have diabetes, and carry a card that lists all your medications. Many nonprescription cough and cold products contain sugar or alcohol. These can affect diabetes control or can alter the results of tests used to monitor blood sugar. Avoid alcohol. Avoid products that contain alcohol or sugar. What side effects may I notice from receiving this medicine? Side effects that you should report to your health care professional or doctor as soon as possible: Symptoms of low blood sugar: -You may feel nervous, confused, dizzy, hungry, weak, sweaty, shaky, cold, and irritable. You may also experience headache, blurred vision, rapid heartbeat and loss of consciousness. Symptoms of high blood sugar: -You may experience dizziness, dry mouth, dry skin, fruity breath, loss of appetite, nausea, stomach ache, unusual thirst, frequent urination Insulin also can cause rare but serious allergic reactions in some patients, including: -allergic reactions like skin rash, itching or hives, swelling of the face, lips, or  tongue -breathing problems Side effects that usually do not require medical attention (report to your health care professional or doctor if they continue or are bothersome): -increase or decrease in fatty tissue under the skin, through overuse of a particular injection -itching, burning, swelling, or rash at the injection site This list may not describe all possible side effects. Call your doctor for medical advice about side effects. You may report side effects to FDA at 1-800-FDA-1088. Where should I keep my medicine? Keep out of the reach of children. Store unopened insulin vials in a refrigerator between 2 and 8 degrees C (36 and 46 degrees F). Do not freeze or use if the insulin has been frozen. If unopened and in the refrigerator, your insulin can be used until the expiration date printed on the vial. Opened vials (vials currently in use) may be stored at room temperature, at approximately 25 degrees C (77 degrees F) or cooler. Keeping your insulin at room temperature decreases the amount of pain during injection. If you are using Humulin brand vials, your insulin should be thrown away after 31 days. If you are using Novolin brand vials, your insulin should be thrown away after 42 days. Protect from light and excessive heat. Throw away any unused medicine after the expiration date or  after the specified time for room temperature storage has passed. NOTE: This sheet is a summary. It may not cover all possible information. If you have questions about this medicine, talk to your doctor, pharmacist, or health care provider.  2012, [employer]vier/[employer] Standard. (06/21/2009 1:15:33 PM)

## 2012-12-09 NOTE — Progress Notes (Signed)
Blood Sugar 338, orders received for 15U insulin.

## 2012-12-10 ENCOUNTER — Telehealth: Payer: Self-pay | Admitting: *Deleted

## 2012-12-10 DIAGNOSIS — N289 Disorder of kidney and ureter, unspecified: Secondary | ICD-10-CM

## 2012-12-10 NOTE — Telephone Encounter (Signed)
Spoke with [name] to follow up on previous phone note where [name] was very weak and was advised to go to the ER. [name] states he did not go to the ER but waited to see Dr Myna Hidalgo the next day. Had labs completed there and was told we could give him those results? [name] also states that he is having lower back pain and is requesting medication to help with that?  Please advise.

## 2012-12-10 NOTE — Telephone Encounter (Signed)
We are limited in what we can give him for pain. He cannot take steroids due to uncontrolled DM and cannot take anti-inflammatories due to increased bleeding risk. I would recommend sparing use of tramadol.  Do not drive after taking.  Please call in tramadol 1 tab PO every 8 hours PRN # 30 zero refills.

## 2012-12-10 NOTE — Telephone Encounter (Signed)
Please call pt and let him know that his kidney function has worsened slightly. I will refer him to see a kidney doctor.  He should stop metformin due to kidneys. Start Januvia 50mg  once daily.  Increase lantus to 45 units once daily.  He should schedule follow up with Dr. Elvera Lennox.

## 2012-12-13 MED ORDER — SITAGLIPTIN PHOSPHATE 50 MG PO TABS: 50.0000 mg | ORAL_TABLET | Freq: Every day | ORAL | Status: DC

## 2012-12-13 NOTE — Telephone Encounter (Signed)
Notified pt. He wishes to hold off on any pain medication at this time as he states he is scheduled for surgery on 12/19/12. Notified pt of medication change / recommendations below and he voices understanding. Pt has follow up with [place] tomorrow at 10:30am.

## 2012-12-14 ENCOUNTER — Ambulatory Visit (INDEPENDENT_AMBULATORY_CARE_PROVIDER_SITE_OTHER): Payer: Medicare Other | Admitting: Family

## 2012-12-14 ENCOUNTER — Telehealth: Payer: Self-pay | Admitting: *Deleted

## 2012-12-14 ENCOUNTER — Encounter: Payer: Self-pay | Admitting: Family

## 2012-12-14 ENCOUNTER — Ambulatory Visit: Payer: Medicare Other | Admitting: Family

## 2012-12-14 VITALS — BP 150/86 | HR 94 | Temp 97.6°F | Resp 18 | Ht 67.0 in | Wt 161.0 lb

## 2012-12-14 DIAGNOSIS — E1165 Type 2 diabetes mellitus with hyperglycemia: Secondary | ICD-10-CM

## 2012-12-14 NOTE — Telephone Encounter (Signed)
Received call from pt's wife, [name] requesting clarification of instructions that pt was given at today's appt. Requested clarification and status of referrals to neurosurgery and nephrology. Reviewed titration instructions, advised her of need to stop metformin and start januvia. Notified her that referrals have been initiated to neurosurgery and nephrology and to call for status update if pt has not been contacted about referrals within 1 week and she voices understanding.

## 2012-12-14 NOTE — Patient Instructions (Addendum)
Increase lantus to 45 units once daily in the evening. Continue this dose for 3 days.  If AM fasting sugar remains > 100 you should increase lantus by 3 units every three days until your fasting sugar is less than 100.  Then continue on that dose.  Call if you develop sugar less than 80.

## 2012-12-14 NOTE — Assessment & Plan Note (Signed)
Titrate lantus up as outlined in AVS. Advised pt to follow up in 3 weeks for re-evaluation and hopefully surgical clearance at that time.

## 2012-12-14 NOTE — Progress Notes (Signed)
Subjective:    Patient ID: [name], male    DOB: 02/14/1946, 67 y.o.   MRN: [id number]  HPI  [name] is a 67 yr old male who presents today for follow up of his diabetes.  His last A1C was 9.5 in the middle of august.  Sugar in our office last week was 460.  He reports + compliance with medications.  He is taking lantus 40 units HS.  He will be increasing to 45 units- per our recommendation.    Humalog-  Reports that he is supposed to take 5 units TID.  Reports that based on sliding scale he has needed about 10 units tid AC meals.    He reports + compliance with medication.      Review of Systems See HPI  Past Medical History  Diagnosis Date  . Hypertension   . High cholesterol   . GERD (gastroesophageal reflux disease)   . GI bleed     a. Recurrent GI bleed, tx with IV iron. b. Per heme notes - likely AVMs 04/2012 (tx with cauterization several months ago).  . Orthostatic hypotension     a. Tx with florinef.  . Hematuria     a. Urology note scan from 07/2012: cystoscopy without evidence for bladder lesion, only lateral hypertrophy of posterior urethra, bladder impression from BPH. b. Pt states he had "some tests" scheduled for later in July 2014.  . Stage III chronic kidney disease     a. Stage 3 (DM with complications ->CKD, peripheral neuropathy).  . Anemia, iron deficiency 03/24/2011    a. Recurrent GI bleed, tx with periodic iron infusions.  . 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 Sleep apnea     "had mask; couldn't sleep in it" (09/20/2012)  . Type II diabetes mellitus     a. Dx 1994, uncontrolled.   . Diabetic peripheral neuropathy 2014    foot pain.  . Chronic lower back pain   . Fatty liver     on CT of 11/2010  . AVM (arteriovenous malformation) of duodenum, acquired     egds in 01/2012, 04/2011  . Polyp, colonic     Colonoscopy 01/2012 "benign" polyp  . CAD (coronary artery disease)     a. Cath 09/2012: moderate borderline CAD in mid LAD/small diagonal branch,  mild RCA stenosis, to be managed medically   . BPH (benign prostatic hyperplasia)   . Prostate cancer   . Stroke     History   Social History  . Marital Status: Married    Spouse Name: N/A    Number of Children: 2  . Years of Education: N/A   Occupational History  . taken out of work due to back    Social History Main Topics  . Smoking status: Former Smoker -- 1.00 packs/day for 48 years    Types: Cigarettes    Quit date: 03/17/2012  . Smokeless tobacco: Never Used  . Alcohol Use: No     Comment: 09/20/2012 "Used to; stopped ~ 2009; never had problem w/it"  . Drug Use: No  . Sexual Activity: No   Other Topics Concern  . Not on file   Social History Narrative  . No narrative on file    Past Surgical History  Procedure Laterality Date  . Shoulder open rotator cuff repair Left 1980's  . Inguinal hernia repair Right 2011  . Lumbar disc surgery  1980's    Family History  Problem Relation Age of Onset  .  Heart attack Brother     Died at 45  . Stroke Father     Died at 2  . Diabetes Sister   . Diabetes Mother   . Stomach cancer Brother   . Heart attack Sister     No Known Allergies  Current Outpatient Prescriptions on File Prior to Visit  Medication Sig Dispense Refill  . atorvastatin (LIPITOR) 80 MG tablet Take 1 tablet (80 mg total) by mouth daily.  30 tablet  2  . carvedilol (COREG) 3.125 MG tablet Take 1 tablet (3.125 mg total) by mouth 2 (two) times daily with a meal.  30 tablet  2  . Cholecalciferol (VITAMIN D) 2000 UNITS CAPS Take 2,000 Units by mouth daily.      . ciprofloxacin (CIPRO) 500 MG tablet       . dipyridamole-aspirin (AGGRENOX) 200-25 MG per 12 hr capsule Take 1 capsule by mouth 2 (two) times daily.  60 capsule  2  . esomeprazole (NEXIUM) 40 MG capsule Take 40 mg by mouth 2 (two) times daily.      . fluticasone (FLONASE) 50 MCG/ACT nasal spray Place 2 sprays into the nose daily.      [name] Kitchen gabapentin (NEURONTIN) 300 MG capsule Take 300 mg by  mouth at bedtime.      . insulin glargine (LANTUS) 100 UNIT/ML injection Inject 45 Units into the skin at bedtime.       . insulin lispro (HUMALOG) 100 UNIT/ML injection Inject 5 Units into the skin 3 (three) times daily before meals.      . Insulin Syringe-Needle U-100 (BD INSULIN SYRINGE ULTRAFINE) 31G X 5/16" 0.5 ML MISC Use 3x a day as advised  100 each  1  . losartan (COZAAR) 100 MG tablet Take 100 mg by mouth daily.      . Multiple Vitamin (MULTIVITAMIN WITH MINERALS) TABS tablet Take 1 tablet by mouth daily.      [name] Kitchen NEXIUM 40 MG capsule       . nitroGLYCERIN (NITROSTAT) 0.4 MG SL tablet Place 1 tablet (0.4 mg total) under the tongue every 5 (five) minutes as needed for chest pain (up to 3 doses).  25 tablet  2  . oxyCODONE-acetaminophen (PERCOCET/ROXICET) 5-325 MG per tablet Take 1-2 tablets by mouth every 6 (six) hours as needed for pain.  15 tablet  0  . polyvinyl alcohol (LIQUIFILM TEARS) 1.4 % ophthalmic solution Place 1 drop into both eyes daily as needed (dry eyes).      . sitaGLIPtin (JANUVIA) 50 MG tablet Take 1 tablet (50 mg total) by mouth daily.  30 tablet  3  . sulfamethoxazole-trimethoprim (SEPTRA DS) 800-160 MG per tablet Take 1 tablet by mouth every 12 (twelve) hours.  14 tablet  0  . tamsulosin (FLOMAX) 0.4 MG CAPS Take 0.4 mg by mouth daily after supper.       No current facility-administered medications on file prior to visit.    BP 150/86  Pulse 94  Temp(Src) 97.6 F (36.4 C) (Oral)  Resp 18  Ht 5\' 7"  (1.702 m)  Wt 161 lb (73.029 kg)  BMI 25.21 kg/m2  SpO2 99%       Objective:   Physical Exam  Constitutional: He is oriented to person, place, and time. He appears well-developed and well-nourished. No distress.  Neurological: He is alert and oriented to person, place, and time.  Skin:  See DM foot exam  Psychiatric: He has a normal mood and affect. His behavior is normal.  Assessment & Plan:  15 minutes spent with pt today.  >50% of this time  was spent counseling pt on diabetes.

## 2012-12-15 ENCOUNTER — Telehealth: Payer: Self-pay | Admitting: Hematology & Oncology

## 2012-12-15 NOTE — Telephone Encounter (Signed)
Per MD move pt out anytime in nov. Left pt message moved 10-6 to 11-10

## 2012-12-16 ENCOUNTER — Encounter: Payer: Medicare Other | Admitting: Cardiology

## 2012-12-16 ENCOUNTER — Telehealth: Payer: Self-pay | Admitting: *Deleted

## 2012-12-16 NOTE — Telephone Encounter (Signed)
Received call from pt stating his blood sugar has been 440 - 450 all day.  Pt confirmed that he is now taking 48 units of lantus at bedtime and reports compliance (no missed doses). He usually takes 5 units humalog three times daily before meals but today he has taken 10 units twice. Pt stated he feels dizzy and weaker than usual. Upon further questioning pt states he has not picked up Januvia as it is too expensive ($150) and he has not been taking an oral agent since last visit (12/14/12).  Pt reports that he ate gritts, eggs and toast for breakfast. Ate a [place] sandwich at 12pm and then another [place] sandwich at 2:30pm. Per verbal from Provider, was going to advise pt to take 10 units humalog now and call back in 1 hour with reading. When I asked what time pt took his lunchtime humalog he stated 30 minutes ago.  Advised pt of importance of taking humalog BEFORE he eats meals and to lower carbohydrate intake, monitor portion sizes. Advised pt to wait 30 more minutes and call me back with blood sugar reading and further instructions. Called pt's wife and reviewed medications/doses. Advised her to monitor pt's dosing of insulin to make sure he is drawing up correct amounts. Also reviewed with her that pt should take humalog before meals and discussed limiting carbohydrate intake and portion sizes. Pt's wife expresses concern that pt's blood sugar has been elevated since starting aggrenox after his stroke (11/01/12) and she wonders if this could be causing the elevation?  Also, please advise re: cheaper januvia alternative? Called pt, BS is now 362 (1 hour after 10 units of Humalog).  Advised pt to proceed with normal insulin regimen until further instructions.

## 2012-12-16 NOTE — Telephone Encounter (Signed)
Amy, could you please ask Dr. Myna Hidalgo to take a look at Mr. Vivas MRI? thanks

## 2012-12-17 ENCOUNTER — Telehealth: Payer: Self-pay | Admitting: Family

## 2012-12-17 NOTE — Telephone Encounter (Signed)
Message copied by Sandford Craze on Fri Dec 17, 2012  5:13 PM ------      Message from: Tan[place]a, [place] N      Created: Fri Dec 17, 2012  3:02 PM       Dr Myna Hidalgo reviewed the results of the MRI from 12/04/12. He agrees with the radiologist's impression. The diffuse marrow abnormality is likely due to the anemia (and diabetes). No new interventions needed in that regard.            Thanks,            Amy  ------

## 2012-12-17 NOTE — Telephone Encounter (Addendum)
Called patient to follow up.  He reports that his sugar last night before bed was 106.  This AM fasting was 237.  Discussed referral to diabetes educator and patient is agreeable.

## 2012-12-19 ENCOUNTER — Emergency Department (HOSPITAL_BASED_OUTPATIENT_CLINIC_OR_DEPARTMENT_OTHER)
Admission: EM | Admit: 2012-12-19 | Discharge: 2012-12-19 | Disposition: A | Discharge status: Home / Self Care | Payer: Medicare Other | Attending: Emergency Medicine | Admitting: Emergency Medicine

## 2012-12-19 ENCOUNTER — Encounter (HOSPITAL_BASED_OUTPATIENT_CLINIC_OR_DEPARTMENT_OTHER): Payer: Self-pay | Admitting: Emergency Medicine

## 2012-12-19 DIAGNOSIS — M5431 Sciatica, right side: Secondary | ICD-10-CM

## 2012-12-19 DIAGNOSIS — Z8673 Personal history of transient ischemic attack (TIA), and cerebral infarction without residual deficits: Secondary | ICD-10-CM | POA: Insufficient documentation

## 2012-12-19 DIAGNOSIS — Z9889 Other specified postprocedural states: Secondary | ICD-10-CM | POA: Insufficient documentation

## 2012-12-19 DIAGNOSIS — N183 Chronic kidney disease, stage 3 unspecified: Secondary | ICD-10-CM | POA: Insufficient documentation

## 2012-12-19 DIAGNOSIS — M2550 Pain in unspecified joint: Secondary | ICD-10-CM | POA: Insufficient documentation

## 2012-12-19 DIAGNOSIS — E1149 Type 2 diabetes mellitus with other diabetic neurological complication: Secondary | ICD-10-CM | POA: Insufficient documentation

## 2012-12-19 DIAGNOSIS — E1142 Type 2 diabetes mellitus with diabetic polyneuropathy: Secondary | ICD-10-CM | POA: Insufficient documentation

## 2012-12-19 DIAGNOSIS — Z87891 Personal history of nicotine dependence: Secondary | ICD-10-CM | POA: Insufficient documentation

## 2012-12-19 DIAGNOSIS — E7800 Pure hypercholesterolemia, unspecified: Secondary | ICD-10-CM | POA: Insufficient documentation

## 2012-12-19 DIAGNOSIS — N400 Enlarged prostate without lower urinary tract symptoms: Secondary | ICD-10-CM | POA: Insufficient documentation

## 2012-12-19 DIAGNOSIS — G4730 Sleep apnea, unspecified: Secondary | ICD-10-CM | POA: Insufficient documentation

## 2012-12-19 DIAGNOSIS — K219 Gastro-esophageal reflux disease without esophagitis: Secondary | ICD-10-CM | POA: Insufficient documentation

## 2012-12-19 DIAGNOSIS — Z8546 Personal history of malignant neoplasm of prostate: Secondary | ICD-10-CM | POA: Insufficient documentation

## 2012-12-19 DIAGNOSIS — I129 Hypertensive chronic kidney disease with stage 1 through stage 4 chronic kidney disease, or unspecified chronic kidney disease: Secondary | ICD-10-CM | POA: Insufficient documentation

## 2012-12-19 DIAGNOSIS — M5430 Sciatica, unspecified side: Secondary | ICD-10-CM | POA: Insufficient documentation

## 2012-12-19 DIAGNOSIS — Z79899 Other long term (current) drug therapy: Secondary | ICD-10-CM | POA: Insufficient documentation

## 2012-12-19 DIAGNOSIS — Z860100 Personal history of colon polyps, unspecified: Secondary | ICD-10-CM | POA: Insufficient documentation

## 2012-12-19 DIAGNOSIS — G8929 Other chronic pain: Secondary | ICD-10-CM | POA: Insufficient documentation

## 2012-12-19 DIAGNOSIS — Z794 Long term (current) use of insulin: Secondary | ICD-10-CM | POA: Insufficient documentation

## 2012-12-19 DIAGNOSIS — Z862 Personal history of diseases of the blood and blood-forming organs and certain disorders involving the immune mechanism: Secondary | ICD-10-CM | POA: Insufficient documentation

## 2012-12-19 DIAGNOSIS — I2510 Atherosclerotic heart disease of native coronary artery without angina pectoris: Secondary | ICD-10-CM | POA: Insufficient documentation

## 2012-12-19 MED ORDER — OXYCODONE-ACETAMINOPHEN 5-325 MG PO TABS
2.0000 | ORAL_TABLET | Freq: Once | ORAL | Status: AC
  Administered 2012-12-19: 2 via ORAL
  Filled 2012-12-19: qty 2

## 2012-12-19 MED ORDER — OXYCODONE-ACETAMINOPHEN 5-325 MG PO TABS: 2.0000 | ORAL_TABLET | ORAL | Status: DC | PRN

## 2012-12-19 MED ORDER — NAPROXEN 500 MG PO TABS: 500.0000 mg | ORAL_TABLET | Freq: Two times a day (BID) | ORAL | Status: DC

## 2012-12-19 NOTE — ED Provider Notes (Signed)
CSN: [id number]     Arrival date & time 12/19/12  0621 History   First MD Initiated Contact with Patient 12/19/12 [phone]     Chief Complaint  Patient presents with  . Back Pain   (Consider location/radiation/quality/duration/timing/severity/associated sxs/prior Treatment) HPI  Patient has a history of known disc disease recent MRI.  He does not pain every day but has had several exacerbations recently.   MRI did not show any spread of his known prostate cancer to his spine. He states his last 3 days he's had pain that starts in his right buttock and down the posterior aspect of his right leg. With questioning he does state that it is worse when he sits. It is much improved with laying supine. He does have weakness. He does not have a symmetric loss of sensation he has a stocking distribution paresthesias/neuropathy in both feet that is unchanged. He is scheduled for a prostate procedure for his prostate cancer on November 8. He states that he is not incontinent however he does feel radial emergency and states when he has to go he has to go quickly but has not been incontinent and this is unchanged.   Past Medical History  Diagnosis Date  . Hypertension   . High cholesterol   . GERD (gastroesophageal reflux disease)   . GI bleed     a. Recurrent GI bleed, tx with IV iron. b. Per heme notes - likely AVMs 04/2012 (tx with cauterization several months ago).  . Orthostatic hypotension     a. Tx with florinef.  . Hematuria     a. Urology note scan from 07/2012: cystoscopy without evidence for bladder lesion, only lateral hypertrophy of posterior urethra, bladder impression from BPH. b. Pt states he had "some tests" scheduled for later in July 2014.  . Stage III chronic kidney disease     a. Stage 3 (DM with complications ->CKD, peripheral neuropathy).  . Anemia, iron deficiency 03/24/2011    a. Recurrent GI bleed, tx with periodic iron infusions.  . 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 Sleep apnea      "had mask; couldn't sleep in it" (09/20/2012)  . Type II diabetes mellitus     a. Dx 1994, uncontrolled.   . Diabetic peripheral neuropathy 2014    foot pain.  . Chronic lower back pain   . Fatty liver     on CT of 11/2010  . AVM (arteriovenous malformation) of duodenum, acquired     egds in 01/2012, 04/2011  . Polyp, colonic     Colonoscopy 01/2012 "benign" polyp  . CAD (coronary artery disease)     a. Cath 09/2012: moderate borderline CAD in mid LAD/small diagonal branch, mild RCA stenosis, to be managed medically   . BPH (benign prostatic hyperplasia)   . Prostate cancer   . Stroke    Past Surgical History  Procedure Laterality Date  . Shoulder open rotator cuff repair Left 1980's  . Inguinal hernia repair Right 2011  . Lumbar disc surgery  1980's   Family History  Problem Relation Age of Onset  . Heart attack Brother     Died at 43  . Stroke Father     Died at 63  . Diabetes Sister   . Diabetes Mother   . Stomach cancer Brother   . Heart attack Sister    History  Substance Use Topics  . Smoking status: Former Smoker -- 1.00 packs/day for 48 years    Types: Cigarettes  Quit date: 03/17/2012  . Smokeless tobacco: Never Used  . Alcohol Use: No     Comment: 09/20/2012 "Used to; stopped ~ 2009; never had problem w/it"    Review of Systems  Constitutional: Negative for fever, chills, diaphoresis, appetite change and fatigue.  HENT: Negative for sore throat, mouth sores and trouble swallowing.   Eyes: Negative for visual disturbance.  Respiratory: Negative for cough, chest tightness, shortness of breath and wheezing.   Cardiovascular: Negative for chest pain.  Gastrointestinal: Negative for nausea, vomiting, abdominal pain, diarrhea and abdominal distention.  Endocrine: Negative for polydipsia, polyphagia and polyuria.  Genitourinary: Negative for dysuria, frequency and hematuria.  Musculoskeletal: Positive for back pain and arthralgias. Negative for gait problem.   Skin: Negative for color change, pallor and rash.  Neurological: Negative for dizziness, syncope, weakness, light-headedness and headaches.  Hematological: Does not bruise/bleed easily.  Psychiatric/Behavioral: Negative for behavioral problems and confusion.    Allergies  Review of patient's allergies indicates no known allergies.  Home Medications   Current Outpatient Rx  Name  Route  Sig  Dispense  Refill  . atorvastatin (LIPITOR) 80 MG tablet   Oral   Take 1 tablet (80 mg total) by mouth daily.   30 tablet   2   . carvedilol (COREG) 3.125 MG tablet   Oral   Take 1 tablet (3.125 mg total) by mouth 2 (two) times daily with a meal.   30 tablet   2   . Cholecalciferol (VITAMIN D) 2000 UNITS CAPS   Oral   Take 2,000 Units by mouth daily.         . ciprofloxacin (CIPRO) 500 MG tablet               . dipyridamole-aspirin (AGGRENOX) 200-25 MG per 12 hr capsule   Oral   Take 1 capsule by mouth 2 (two) times daily.   60 capsule   2     Take  1 capsule daily x 1 week then change to twic ...   . esomeprazole (NEXIUM) 40 MG capsule   Oral   Take 40 mg by mouth 2 (two) times daily.         . fluticasone (FLONASE) 50 MCG/ACT nasal spray   Nasal   Place 2 sprays into the nose daily.         [name] Kitchen gabapentin (NEURONTIN) 300 MG capsule   Oral   Take 300 mg by mouth at bedtime.         . insulin glargine (LANTUS) 100 UNIT/ML injection   Subcutaneous   Inject 45 Units into the skin at bedtime.          . insulin lispro (HUMALOG) 100 UNIT/ML injection   Subcutaneous   Inject 5 Units into the skin 3 (three) times daily before meals.         . Insulin Syringe-Needle U-100 (BD INSULIN SYRINGE ULTRAFINE) 31G X 5/16" 0.5 ML MISC      Use 3x a day as advised   100 each   1   . losartan (COZAAR) 100 MG tablet   Oral   Take 100 mg by mouth daily.         . Multiple Vitamin (MULTIVITAMIN WITH MINERALS) TABS tablet   Oral   Take 1 tablet by mouth daily.          . naproxen (NAPROSYN) 500 MG tablet   Oral   Take 1 tablet (500 mg total) by mouth 2 (two) times daily.  30 tablet   0   . NEXIUM 40 MG capsule               . nitroGLYCERIN (NITROSTAT) 0.4 MG SL tablet   Sublingual   Place 1 tablet (0.4 mg total) under the tongue every 5 (five) minutes as needed for chest pain (up to 3 doses).   25 tablet   2     IMPORTANT: Do not take this medication if you have ...   . oxyCODONE-acetaminophen (PERCOCET/ROXICET) 5-325 MG per tablet   Oral   Take 1-2 tablets by mouth every 6 (six) hours as needed for pain.   15 tablet   0   . oxyCODONE-acetaminophen (PERCOCET/ROXICET) 5-325 MG per tablet   Oral   Take 2 tablets by mouth every 4 (four) hours as needed for pain.   20 tablet   0   . polyvinyl alcohol (LIQUIFILM TEARS) 1.4 % ophthalmic solution   Both Eyes   Place 1 drop into both eyes daily as needed (dry eyes).         . sitaGLIPtin (JANUVIA) 50 MG tablet   Oral   Take 1 tablet (50 mg total) by mouth daily.   30 tablet   3   . sulfamethoxazole-trimethoprim (SEPTRA DS) 800-160 MG per tablet   Oral   Take 1 tablet by mouth every 12 (twelve) hours.   14 tablet   0   . tamsulosin (FLOMAX) 0.4 MG CAPS   Oral   Take 0.4 mg by mouth daily after supper.          BP 148/80  Pulse 98  Temp(Src) 97.4 F (36.3 C) (Oral)  Resp 20  Ht 5\' 7"  (1.702 m)  Wt 163 lb (73.936 kg)  BMI 25.52 kg/m2  SpO2 100% Physical Exam  Constitutional: He is oriented to person, place, and time. He appears well-developed and well-nourished. No distress.  HENT:  Head: Normocephalic.  Eyes: Conjunctivae are normal. Pupils are equal, round, and reactive to light. No scleral icterus.  Neck: Normal range of motion. Neck supple. No thyromegaly present.  Cardiovascular: Normal rate and regular rhythm.  Exam reveals no gallop and no friction rub.   No murmur heard. Pulmonary/Chest: Effort normal and breath sounds normal. No respiratory distress.  He has no wheezes. He has no rales.  Abdominal: Soft. Bowel sounds are normal. He exhibits no distension. There is no tenderness. There is no rebound.  Musculoskeletal: Normal range of motion.  Neurological: He is alert and oriented to person, place, and time.  Nel[place] symmetric strength to flex/.extend hip and knees, dorsi/plantar flex ankles. symmetric sensation to all distributions to LEs (Stocking distribution neuropathy to BLE) Patellar and achilles reflexes 1-2+. Downgoing Babinski   Skin: Skin is warm and dry. No rash noted.  Psychiatric: He has a normal mood and affect. His behavior is normal.   he does have tenderness in the right sciatic notch. He indicates his distribution of his pain is directly down the posterior aspect of his leg. He does not show any radiation of the pain to the anterior medial thigh her knee in a radicular-type pattern.    ED Course  Procedures (including critical care time) Labs Review Labs Reviewed - No data to display Imaging Review No results found.  I reviewed his recent MRI which shows multi-level disease. He does have a disc that does effface the foramina at L3-4 bilaterally. MDM   1. Sciatica, right    He hasn't noticed disease. His  pattern today historically) exam follows eye sciatica pattern. He has no signs of neurological loss with weakness incontinence or new sensation or asymmetric findings. Hasn't used steroids for treatment of his sciatica because of his diabetes he states although he has pretty good control at times he will run over 400. None of the last week and over 200. Plan will be anti-inflammatories Percocet off of his feet supine physical therapy referral primary care followup I discussed changes suggestive of acute disc herniation with unilateral weakness foot drop incontinence or other changes. He expressed understanding agrees to follow with his physician or return here for acute changes    Roney Marion, MD 12/19/12 [phone]

## 2012-12-19 NOTE — ED Notes (Addendum)
Pt denies hx of recent injury or event that caused change in back pain and has had MRI here at [facility] ordered Dr Peggyann Juba to r/o kidney stones and bulging disc discovered pt reports chronic pain increased during the night and Rx strength motrin, meds ineffective.

## 2012-12-20 ENCOUNTER — Telehealth: Payer: Self-pay | Admitting: Family

## 2012-12-20 ENCOUNTER — Ambulatory Visit: Payer: Medicare Other | Admitting: Hematology & Oncology

## 2012-12-20 ENCOUNTER — Other Ambulatory Visit: Payer: Medicare Other | Admitting: Lab

## 2012-12-20 ENCOUNTER — Ambulatory Visit: Payer: Medicare Other

## 2012-12-20 NOTE — Telephone Encounter (Signed)
No problem. Will call him to schedule an appt as soon as possible. Thank you!

## 2012-12-20 NOTE — Telephone Encounter (Signed)
Dr. Elvera Lennox- this pt is scheduled for prostatectomy on 11/5.  Has been having issues with uncontrolled sugars.  Could you please try to get him back in prior to surgery for follow up?  Urology would like his sugars to be better controlled pre-operatively.  Compliance and poor insight is an issue for him.  I have also requested an appointment with the diabetic educator. Thank you for your help!

## 2012-12-23 ENCOUNTER — Telehealth: Payer: Self-pay | Admitting: Internal Medicine

## 2012-12-23 NOTE — Telephone Encounter (Signed)
Message copied by Griffin Dakin on Thu Dec 23, 2012  8:39 AM ------      Message from: Carlus Pavlov      Created: Mon Dec 20, 2012 12:22 PM       Sherri,      Can we schedule an appt for pt to come back this week as he has surgery early next mo and PCP asked me to see him before and get his DM under ctrl. He is a f/u.      Thank you,      CG ------

## 2012-12-23 NOTE — Telephone Encounter (Signed)
Pt is scheduled to come in on 12/30/12 / [name]

## 2012-12-27 ENCOUNTER — Other Ambulatory Visit: Payer: Medicare Other | Admitting: Lab

## 2012-12-27 ENCOUNTER — Ambulatory Visit: Payer: Medicare Other | Admitting: Hematology & Oncology

## 2012-12-27 ENCOUNTER — Ambulatory Visit: Payer: Medicare Other

## 2012-12-29 ENCOUNTER — Telehealth: Payer: Self-pay | Admitting: *Deleted

## 2012-12-29 NOTE — Telephone Encounter (Signed)
Received message from [facility] requesting status of order for back brace. Left message at [phone] that they should contact pt. If pt is wanting a back brace they should send the order to the patient and have the pt call us for appt to discuss the request. We are no longer taking outside orders unless the pt requests the product and the patient must notify us of the request.

## 2012-12-30 ENCOUNTER — Encounter: Payer: Self-pay | Admitting: Internal Medicine

## 2012-12-30 ENCOUNTER — Ambulatory Visit (INDEPENDENT_AMBULATORY_CARE_PROVIDER_SITE_OTHER): Payer: Medicare Other | Admitting: Internal Medicine

## 2012-12-30 VITALS — BP 122/68 | HR 88 | Temp 98.2°F | Resp 12 | Ht 67.0 in | Wt 161.5 lb

## 2012-12-30 DIAGNOSIS — E1165 Type 2 diabetes mellitus with hyperglycemia: Secondary | ICD-10-CM

## 2012-12-30 NOTE — Patient Instructions (Signed)
Please return in 2 weeks with your sugar log. - For now, we will keep the Lantus at 45 units - increase the breakfast and lunchtime Humalog to 7 units.  - keep dinnertime Humalog at 5 units. - use the following Sliding scale: - 150-175: + 1 unit  - 176-200: + 2 units  - 201-225: + 3 units  - >226: + 4 units  Please call in 1 week with your sugars.

## 2012-12-30 NOTE — Progress Notes (Signed)
Patient ID: [name], male   DOB: 1945-06-28, 67 y.o.   MRN: [id number] HPI: [name] is a 67 y.o.-year-old male, returning for f/u for DM2, dx 1994, insulin-dependent, uncontrolled, with complications (CKD stage 3, peripheral neuropathy, ED). Last visit 4 mo ago.   He has prostate surgery coming up in 01/19/2013.  Pt's insulin was added in 1996. Last hemoglobin A1c was: Lab Results  Component Value Date   HGBA1C 9.5* 10/31/2012    Pt is on a regimen of: Stopped Metformin 500 mg po bid b/c CKD. - Lantus 45 qhs (uses pens) - Humalog: 5 units with a meal He did not follow the SSI that I gave him at last visit, rather continued his previous one: 5 units tid ac + SSI: 200-300: + 3 units, >300: 10 units (uses vials)  Pt checks his sugars 1-3 a day - b/w 60-400s: - am: ~260, but saw 500's >> 120-400 >> 137-443 - before lunch: low 200s >> 132-205 - before dinner: 300s or highest >> 200s >> 100-300s - bedtime: 130-180 now, last mo had a 63 >> 101-260 Had lows ~ 1 a week. This happened when he took his Humalog without eating. Lowest sugar was 40s;  he has hypoglycemia awareness at 70. Highest sugar was 411.  Pt's meals are: - Breakfast: grits, egg, liver pudding - Lunch: PB+crackers or nothing - Dinner: meat+veggies+starch - Snacks: banana  Pt has chronic kidney disease stage 3, last BUN/creatinine was:  Lab Results  Component Value Date   BUN 23* 12/09/2012   CREATININE 1.6* 12/09/2012   Last set of lipids: Lab Results  Component Value Date   CHOL 153 10/31/2012   HDL 29* 10/31/2012   LDLCALC 83 10/31/2012   TRIG 203* 10/31/2012   CHOLHDL 5.3 10/31/2012   Pt's last eye exam was in Spring of last year - Dr. Hazle Quant. ? No DR. Has numbness and tingling in his legs.  He also has a history of HTN, microscopic hematuria, gastro- and laryngo-esophageal reflux with hoarseness, IDA, anemia of chronic renal ds., HL.   ROS: Constitutional: no weight gain/loss, no fatigue, no subjective  hyperthermia/hypothermia Eyes: no blurry vision, no xerophthalmia ENT: no sore throat, no nodules palpated in throat, no dysphagia/odynophagia, no hoarseness Cardiovascular: no CP/SOB/palpitations/leg swelling Respiratory: no cough/SOB Gastrointestinal: no N/V/D/C Musculoskeletal: no muscle/joint aches Skin: no rashes Neurological: no tremors/numbness/tingling/dizziness Psychiatric: no depression/anxiety  I reviewed pt's medications, allergies, PMH, social hx, family hx and no changes required, except as mentioned above.  PE: BP 122/68  Pulse 88  Temp(Src) 98.2 F (36.8 C) (Oral)  Resp 12  Ht 5\' 7"  (1.702 m)  Wt 161 lb 8 oz (73.256 kg)  BMI 25.29 kg/m2  SpO2 95% Wt Readings from Last 3 Encounters:  12/30/12 161 lb 8 oz (73.256 kg)  12/19/12 163 lb (73.936 kg)  12/14/12 161 lb (73.029 kg)   Constitutional: normal weight, in NAD Eyes: PERRLA, EOMI, no exophthalmos ENT: moist mucous membranes, no thyromegaly, no cervical lymphadenopathy Cardiovascular: RRR, No MRG Respiratory: CTA B Gastrointestinal: abdomen soft, NT, ND, BS+ Musculoskeletal: no deformities, strength intact in all 4 Skin: moist, warm, no rashes Neurological: no tremor with outstretched hands, DTR normal in all 4  ASSESSMENT: 1. DM2, insulin-dependent, uncontrolled, with complications - CKD 3 - ED - peripheral neuropathy  PLAN:  1. Pt with long-standing DM2, uncontrolled, on a large dose of basal compared to mealtime insulin. He did not follow my advice regarding the mealtime insulin doses. He also did  not return 1 month after our last appt. - For now, we will keep the Lantus at 45 units - increase the breakfast and lunchtime Humalog to 7 units.  - keep dinnertime Humalog at 5 units. - continue the SSI with target 150 and ISF 25. - stop injecting Humalog if he does not eat a meal! - given sugar log and advised how to fill it and to bring it at next appt, strongly advised him to add comments to his log  to see if these explain the fluctuating sugars. We might need an iPro CGM for 5 days... - will need a repeat HbA1C at next visit. - already had a flu exam this season - I will see him back in 2 weeks with his sugar log - I advised them to call me and let me know about his sugars in a week

## 2013-01-03 ENCOUNTER — Ambulatory Visit: Payer: Medicare Other | Admitting: Family

## 2013-01-04 ENCOUNTER — Encounter: Payer: Self-pay | Admitting: Family

## 2013-01-04 ENCOUNTER — Telehealth: Payer: Self-pay | Admitting: Family

## 2013-01-04 ENCOUNTER — Ambulatory Visit (INDEPENDENT_AMBULATORY_CARE_PROVIDER_SITE_OTHER): Payer: Medicare Other | Admitting: Family

## 2013-01-04 VITALS — BP 144/80 | HR 96 | Temp 98.0°F | Resp 16 | Ht 67.0 in | Wt 163.1 lb

## 2013-01-04 DIAGNOSIS — E119 Type 2 diabetes mellitus without complications: Secondary | ICD-10-CM

## 2013-01-04 DIAGNOSIS — N429 Disorder of prostate, unspecified: Secondary | ICD-10-CM

## 2013-01-04 DIAGNOSIS — Z5181 Encounter for therapeutic drug level monitoring: Secondary | ICD-10-CM

## 2013-01-04 DIAGNOSIS — E1165 Type 2 diabetes mellitus with hyperglycemia: Secondary | ICD-10-CM

## 2013-01-04 DIAGNOSIS — D649 Anemia, unspecified: Secondary | ICD-10-CM

## 2013-01-04 DIAGNOSIS — I2510 Atherosclerotic heart disease of native coronary artery without angina pectoris: Secondary | ICD-10-CM

## 2013-01-04 DIAGNOSIS — D631 Anemia in chronic kidney disease: Secondary | ICD-10-CM

## 2013-01-04 DIAGNOSIS — C61 Malignant neoplasm of prostate: Secondary | ICD-10-CM

## 2013-01-04 DIAGNOSIS — E785 Hyperlipidemia, unspecified: Secondary | ICD-10-CM

## 2013-01-04 LAB — HEPATIC FUNCTION PANEL
ALT: 14 U/L (ref 0–53)
Bilirubin, Direct: 0.1 mg/dL (ref 0.0–0.3)
Indirect Bilirubin: 0.3 mg/dL (ref 0.0–0.9)
Total Protein: 6.2 g/dL (ref 6.0–8.3)

## 2013-01-04 LAB — CBC WITH DIFFERENTIAL/PLATELET
Basophils Absolute: 0 10*3/uL (ref 0.0–0.1)
Basophils Relative: 1 % (ref 0–1)
HCT: 35.3 % — ABNORMAL LOW (ref 39.0–52.0)
MCHC: 33.1 g/dL (ref 30.0–36.0)
Monocytes Absolute: 0.5 10*3/uL (ref 0.1–1.0)
Neutro Abs: 2.4 10*3/uL (ref 1.7–7.7)
Neutrophils Relative %: 60 % (ref 43–77)
Platelets: 311 10*3/uL (ref 150–400)
RDW: 16.9 % — ABNORMAL HIGH (ref 11.5–15.5)

## 2013-01-04 LAB — BASIC METABOLIC PANEL
BUN: 15 mg/dL (ref 6–23)
CO2: 27 mEq/L (ref 19–32)
Calcium: 8.6 mg/dL (ref 8.4–10.5)
Creat: 1.26 mg/dL (ref 0.50–1.35)
Glucose, Bld: 255 mg/dL — ABNORMAL HIGH (ref 70–99)

## 2013-01-04 NOTE — Assessment & Plan Note (Signed)
Sugar is improving.  Appreciate endocrinology's help.

## 2013-01-04 NOTE — Assessment & Plan Note (Signed)
Follow up hemoglobin post iv iron infusion is 11.7.  Appreciate hematology help.

## 2013-01-04 NOTE — Assessment & Plan Note (Addendum)
Pt scheduled for radical prostatectomy on 11/5 with Dr. Isabel Caprice. Neurology advises that aggrenox be held for the shortest amount of time possible.  I have advised pt that he should try to take aggrenox bid for maximal benefit and to try to take second dose before bed to see if he can better tolerate.  Will advise pt to hold aggrenox completely for 7 days pre-operatively.  Aggrenox should be resumed as soon as possible post-operatively.  He is medically cleared for surgery, however I did discuss with the patient that due to his multiple medical problems he is at increased surgical risk. He verbalizes understanding.

## 2013-01-04 NOTE — Patient Instructions (Signed)
Please complete your lab work prior to leaving. Follow up in 3 months.   

## 2013-01-04 NOTE — Assessment & Plan Note (Signed)
He has been evaluated by cardiology preoperatively and is cleared from their stand point but felt to be an overall increased surgical risk.

## 2013-01-04 NOTE — Progress Notes (Signed)
Subjective:    Patient ID: [name], male    DOB: Mar 22, 1945, 67 y.o.   MRN: [id number]  HPI  [name] is a 67 yr old male with prostate cancer and multiple medical problems who presents today for follow up and surgical clearance for his upcoming prostatectomy.    DM2-  He brings with him today his blood sugar readings through 10/16.  He has had a few elevated sugars (one as high as 443 on 10/11, but in general sugars have been running from the low 100's to the low 200's.  He met with endocrinology last week.  His mealtime humalog was increased from 5 units ac breakfast and lunch to 7 units. He is to continue the 5 units AC dinner.  He reports sugar this AM was 215 and last night was 98.  CAD- was evaluated by cardiology.  They have cleared him from a cardiac perspective.  Though they did not that he will be increased risk due to multiple medical medical problems.   Anemia- follows with Dr. Myna Hidalgo. S/p IV iron infusion 3 weeks ago.    Review of Systems See HPI  Past Medical History  Diagnosis Date  . Hypertension   . High cholesterol   . GERD (gastroesophageal reflux disease)   . GI bleed     a. Recurrent GI bleed, tx with IV iron. b. Per heme notes - likely AVMs 04/2012 (tx with cauterization several months ago).  . Orthostatic hypotension     a. Tx with florinef.  . Hematuria     a. Urology note scan from 07/2012: cystoscopy without evidence for bladder lesion, only lateral hypertrophy of posterior urethra, bladder impression from BPH. b. Pt states he had "some tests" scheduled for later in July 2014.  . Stage III chronic kidney disease     a. Stage 3 (DM with complications ->CKD, peripheral neuropathy).  . Anemia, iron deficiency 03/24/2011    a. Recurrent GI bleed, tx with periodic iron infusions.  . 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 Sleep apnea     "had mask; couldn't sleep in it" (09/20/2012)  . Type II diabetes mellitus     a. Dx 1994, uncontrolled.   . Diabetic  peripheral neuropathy 2014    foot pain.  . Chronic lower back pain   . Fatty liver     on CT of 11/2010  . AVM (arteriovenous malformation) of duodenum, acquired     egds in 01/2012, 04/2011  . Polyp, colonic     Colonoscopy 01/2012 "benign" polyp  . CAD (coronary artery disease)     a. Cath 09/2012: moderate borderline CAD in mid LAD/small diagonal branch, mild RCA stenosis, to be managed medically   . BPH (benign prostatic hyperplasia)   . Prostate cancer   . Stroke     History   Social History  . Marital Status: Married    Spouse Name: N/A    Number of Children: 2  . Years of Education: N/A   Occupational History  . taken out of work due to back    Social History Main Topics  . Smoking status: Former Smoker -- 1.00 packs/day for 48 years    Types: Cigarettes    Quit date: 03/17/2012  . Smokeless tobacco: Never Used  . Alcohol Use: No     Comment: 09/20/2012 "Used to; stopped ~ 2009; never had problem w/it"  . Drug Use: No  . Sexual Activity: No   Other Topics Concern  .  Not on file   Social History Narrative   Regular exercise: rides horses   Caffeine use: occasionally    Past Surgical History  Procedure Laterality Date  . Shoulder open rotator cuff repair Left 1980's  . Inguinal hernia repair Right 2011  . Lumbar disc surgery  1980's    Family History  Problem Relation Age of Onset  . Heart attack Brother     Died at 3  . Stroke Father     Died at 57  . Diabetes Sister   . Diabetes Mother   . Stomach cancer Brother   . Heart attack Sister     No Known Allergies  Current Outpatient Prescriptions on File Prior to Visit  Medication Sig Dispense Refill  . atorvastatin (LIPITOR) 80 MG tablet Take 1 tablet (80 mg total) by mouth daily.  30 tablet  2  . carvedilol (COREG) 3.125 MG tablet Take 1 tablet (3.125 mg total) by mouth 2 (two) times daily with a meal.  30 tablet  2  . Cholecalciferol (VITAMIN D) 2000 UNITS CAPS Take 2,000 Units by mouth daily.       . ciprofloxacin (CIPRO) 500 MG tablet       . dipyridamole-aspirin (AGGRENOX) 200-25 MG per 12 hr capsule Take 1 capsule by mouth 2 (two) times daily.  60 capsule  2  . esomeprazole (NEXIUM) 40 MG capsule Take 40 mg by mouth 2 (two) times daily.      . fluticasone (FLONASE) 50 MCG/ACT nasal spray Place 2 sprays into the nose daily.      [name] Kitchen gabapentin (NEURONTIN) 300 MG capsule Take 300 mg by mouth at bedtime.      . insulin glargine (LANTUS) 100 UNIT/ML injection Inject 45 Units into the skin at bedtime.       . insulin lispro (HUMALOG) 100 UNIT/ML injection Inject 5 Units into the skin 3 (three) times daily before meals.      . Insulin Syringe-Needle U-100 (BD INSULIN SYRINGE ULTRAFINE) 31G X 5/16" 0.5 ML MISC Use 3x a day as advised  100 each  1  . losartan (COZAAR) 100 MG tablet Take 100 mg by mouth daily.      . Multiple Vitamin (MULTIVITAMIN WITH MINERALS) TABS tablet Take 1 tablet by mouth daily.      . naproxen (NAPROSYN) 500 MG tablet Take 1 tablet (500 mg total) by mouth 2 (two) times daily.  30 tablet  0  . NEXIUM 40 MG capsule       . nitroGLYCERIN (NITROSTAT) 0.4 MG SL tablet Place 1 tablet (0.4 mg total) under the tongue every 5 (five) minutes as needed for chest pain (up to 3 doses).  25 tablet  2  . oxyCODONE-acetaminophen (PERCOCET/ROXICET) 5-325 MG per tablet Take 1-2 tablets by mouth every 6 (six) hours as needed for pain.  15 tablet  0  . oxyCODONE-acetaminophen (PERCOCET/ROXICET) 5-325 MG per tablet Take 2 tablets by mouth every 4 (four) hours as needed for pain.  20 tablet  0  . polyvinyl alcohol (LIQUIFILM TEARS) 1.4 % ophthalmic solution Place 1 drop into both eyes daily as needed (dry eyes).      . sitaGLIPtin (JANUVIA) 50 MG tablet Take 1 tablet (50 mg total) by mouth daily.  30 tablet  3  . sulfamethoxazole-trimethoprim (SEPTRA DS) 800-160 MG per tablet Take 1 tablet by mouth every 12 (twelve) hours.  14 tablet  0  . tamsulosin (FLOMAX) 0.4 MG CAPS Take 0.4 mg by mouth  daily  after supper.       No current facility-administered medications on file prior to visit.    BP 144/80  Pulse 96  Temp(Src) 98 F (36.7 C) (Oral)  Resp 16  Ht 5\' 7"  (1.702 m)  Wt 163 lb 1.3 oz (73.973 kg)  BMI 25.54 kg/m2  SpO2 99%       Objective:   Physical Exam  Constitutional: He is oriented to person, place, and time. He appears well-developed and well-nourished. No distress.  HENT:  Head: Normocephalic and atraumatic.  Cardiovascular: Normal rate and regular rhythm.   No murmur heard. Pulmonary/Chest: Effort normal and breath sounds normal. No respiratory distress. He has no wheezes. He has no rales. He exhibits no tenderness.  Abdominal: Soft. Bowel sounds are normal. He exhibits no distension.  Superficial abdominal tenderness to palpation below umbilicus.  No mass, no guarding.  Musculoskeletal: He exhibits no edema.  Neurological: He is alert and oriented to person, place, and time.  Psychiatric: He has a normal mood and affect. His behavior is normal. Judgment and thought content normal.          Assessment & Plan:

## 2013-01-04 NOTE — Telephone Encounter (Signed)
Please contact pt and let him know that he should stop aggrenox 7 days prior to his upcoming surgery.  This will restarted after surgery.

## 2013-01-05 LAB — URINE CULTURE
Colony Count: NO GROWTH
Organism ID, Bacteria: NO GROWTH

## 2013-01-05 NOTE — Telephone Encounter (Signed)
Left message for pt to return my call.

## 2013-01-07 ENCOUNTER — Other Ambulatory Visit: Payer: Self-pay | Admitting: Family

## 2013-01-07 MED ORDER — LISINOPRIL 40 MG PO TABS: 40.0000 mg | ORAL_TABLET | Freq: Every day | ORAL | Status: DC

## 2013-01-10 ENCOUNTER — Encounter (HOSPITAL_COMMUNITY): Payer: Self-pay | Admitting: Pharmacy Technician

## 2013-01-10 NOTE — Telephone Encounter (Signed)
Notified pt and he voices understanding. 

## 2013-01-10 NOTE — Telephone Encounter (Signed)
Surgery is scheduled for 11/5 so last dose should be on 10/29.

## 2013-01-11 ENCOUNTER — Telehealth: Payer: Self-pay | Admitting: *Deleted

## 2013-01-11 NOTE — Telephone Encounter (Signed)
Received call from [facility] at Dr Ellin Goodie office requesting status of surgical clearance. Faxed most recent office note from PCP and cardiology to [phone] at 1pm and gave verbal to [facility].

## 2013-01-12 ENCOUNTER — Other Ambulatory Visit (HOSPITAL_COMMUNITY): Payer: Self-pay | Admitting: Urology

## 2013-01-12 NOTE — Progress Notes (Signed)
eccho 8/14, chest x ray, EKG 9/14 EPIC,  LOV  Evonnie Dawes NP 10/14 EPIC with clearance note and instructions regarding Aggrenox , LOV L Gerhardt NP 9/14, LOV  Dr Elvera Lennox 10/14 ALL EPIC,  LOV L Lam NP 9/14 chart

## 2013-01-12 NOTE — Patient Instructions (Addendum)
20 JHOVANI GRISWOLD  01/12/2013   Your procedure is scheduled on:  01/19/13  [facility]  Report to [facility] at    0630   AM.  Call this number if you have problems the morning of surgery: [phone]     BOWEL PREP AS PER OFFICE  Remember: TAKE ONE HALF DOSAGE INSULIN Tuesday  NIGHT,     DRINK FLUIDS UNTIL BEDTIME OR MIDNIGHT  Tuesday  NIGHT  Do not take anything by mouth :After Midnight. TUESDAY NIGHT   Take these medicines the morning of surgery with A SIP OF WATER: Carvedilol, Nexium            May take Nitroglycerin if needed               DO NOT TAKE ANY DIABETES MEDICATION  Wednesday  MORNING  .  Contacts, dentures or partial plates can not be worn to surgery  Leave suitcase in the car. After surgery it may be brought to your room.  For patients admitted to the hospital, checkout time is 11:00 AM day of  discharge.             SPECIAL INSTRUCTIONS- SEE [place] PREPARING FOR SURGERY INSTRUCTION SHEET-     DO NOT WEAR JEWELRY, LOTIONS, POWDERS, OR PERFUMES.  WOMEN-- DO NOT SHAVE LEGS OR UNDERARMS FOR 12 HOURS BEFORE SHOWERS. MEN MAY SHAVE FACE.  Patients discharged the day of surgery will not be allowed to drive home. IF going home the day of surgery, you must have a driver and someone to stay with you for the first 24 hours  Name and phone number of your driver:    ADMISSION                                                                    Please read over the following fact sheets that you were given: Blood Transfusion Sheet  Information                                                                                   Taniya Dasher  PST [phone]  [phone]                 FAILURE TO FOLLOW THESE INSTRUCTIONS MAY RESULT IN  CANCELLATION   OF YOUR SURGERY                                                  Patient Signature _____________________________

## 2013-01-13 ENCOUNTER — Encounter (HOSPITAL_COMMUNITY)
Admission: RE | Admit: 2013-01-13 | Discharge: 2013-01-13 | Disposition: A | Discharge status: Home / Self Care | Payer: Medicare Other | Source: Ambulatory Visit | Attending: Urology | Admitting: Urology

## 2013-01-13 ENCOUNTER — Encounter (HOSPITAL_COMMUNITY): Payer: Self-pay

## 2013-01-13 DIAGNOSIS — Z01812 Encounter for preprocedural laboratory examination: Secondary | ICD-10-CM | POA: Insufficient documentation

## 2013-01-13 DIAGNOSIS — Z01818 Encounter for other preprocedural examination: Secondary | ICD-10-CM | POA: Insufficient documentation

## 2013-01-13 HISTORY — DX: Polyneuropathy, unspecified: G62.9

## 2013-01-13 LAB — BASIC METABOLIC PANEL
BUN: 17 mg/dL (ref 6–23)
Calcium: 9.1 mg/dL (ref 8.4–10.5)
Chloride: 104 mEq/L (ref 96–112)
Creatinine, Ser: 1.32 mg/dL (ref 0.50–1.35)
GFR calc Af Amer: 63 mL/min — ABNORMAL LOW (ref >90)
GFR calc non Af Amer: 55 mL/min — ABNORMAL LOW (ref >90)
Glucose, Bld: 229 mg/dL — ABNORMAL HIGH (ref 70–99)

## 2013-01-13 LAB — CBC
HCT: 33.7 % — ABNORMAL LOW (ref 39.0–52.0)
MCHC: 34.4 g/dL (ref 30.0–36.0)
Platelets: 255 10*3/uL (ref 150–400)
RDW: 14.7 % (ref 11.5–15.5)

## 2013-01-13 LAB — ABO/RH: ABO/RH(D): AB POS

## 2013-01-13 NOTE — Progress Notes (Signed)
Patient states notified PCP that he can not take AGGRENOX once daily due to headache and stopped this several weeks ago

## 2013-01-14 ENCOUNTER — Ambulatory Visit: Payer: Medicare Other | Admitting: Endocrinology

## 2013-01-14 DIAGNOSIS — Z0289 Encounter for other administrative examinations: Secondary | ICD-10-CM

## 2013-01-17 ENCOUNTER — Telehealth: Payer: Self-pay | Admitting: Family

## 2013-01-17 MED ORDER — CLOPIDOGREL BISULFATE 75 MG PO TABS: ORAL_TABLET | ORAL | Status: DC

## 2013-01-17 MED ORDER — PANTOPRAZOLE SODIUM 40 MG PO TBEC: 40.0000 mg | DELAYED_RELEASE_TABLET | Freq: Every day | ORAL | Status: DC

## 2013-01-17 NOTE — Telephone Encounter (Signed)
Ok to change aggrenox to plavix

## 2013-01-17 NOTE — Telephone Encounter (Signed)
Yes, we would recommend plavix post-op.  He was put on Aggrenox due to history of GI bleeds, but if he cannot tolerate it than plavix would be better than aspirin. Thanks-- Heide Guile, NP

## 2013-01-17 NOTE — Telephone Encounter (Signed)
Please call patient and let him know that I reviewed with neuro his meds. Since he is not tolerating aggrenox,  I have sent rx for plavix instead for him to start after his surgery as soon as Urology will let him.  This should not be taken with nexium so I have sent rx for protonix instead.

## 2013-01-17 NOTE — Telephone Encounter (Signed)
This patient is pre op for radical prostatectomy. He has stopped his aggrenox due to headache.  Post operatively, would you recommend plavix?

## 2013-01-18 ENCOUNTER — Encounter: Payer: Self-pay | Admitting: *Deleted

## 2013-01-18 NOTE — Anesthesia Preprocedure Evaluation (Addendum)
Anesthesia Evaluation    Airway Mallampati: II TM Distance: >3 FB Neck ROM: Full    Dental  (+) Poor Dentition, Missing and Dental Advisory Given   Pulmonary sleep apnea (Noncompliant with CPAP) ,  breath sounds clear to auscultation        Cardiovascular hypertension, Pt. on medications and Pt. on home beta blockers + CAD (Medical management) Rhythm:Regular Rate:Normal     Neuro/Psych  Neuromuscular disease CVA, No Residual Symptoms    GI/Hepatic GERD-  Medicated,  Endo/Other  diabetes, Type 1, Insulin Dependent  Renal/GU Renal InsufficiencyRenal disease     Musculoskeletal   Abdominal   Peds  Hematology   Anesthesia Other Findings   Reproductive/Obstetrics                          Anesthesia Physical Anesthesia Plan  ASA: III  Anesthesia Plan: General   Post-op Pain Management:    Induction: Intravenous  Airway Management Planned: Oral ETT  Additional Equipment:   Intra-op Plan:   Post-operative Plan: Extubation in OR  Informed Consent: I have reviewed the patients History and Physical, chart, labs and discussed the procedure including the risks, benefits and alternatives for the proposed anesthesia with the patient or authorized representative who has indicated his/her understanding and acceptance.   Dental advisory given  Plan Discussed with: CRNA  Anesthesia Plan Comments:        Anesthesia Quick Evaluation

## 2013-01-18 NOTE — H&P (Signed)
Reason For Visit  [name] presents today for robotic-assisted laparoscopic radical prostatectomy with bilateral pelvic lymph node dissection.  His prostate cancer history is summarized below.  Patient has a number of medical comorbidities but we felt that he would potentially do better with a surgical approach and a radiation approach given his prostate cancer situation and concurrent voiding issues.  The patient has had a number of preoperative clearance is obtained as well as some additional assessment and treatment to control some of his medical issues including diabetes mellitus.  He presents now for his surgery.    History of Present Illness     [name] is 67 years of age. He has been under the care of Dr. Arlan Organ for iron deficiency anemia. Apparently he had an elevated PSA at some point but I do not have those values at this time. He was [place] by Dr. Lindley Magn[place]. Transrectal ultrasound revealed a 38 g prostate. There did not appear to be obvious extension outside the gland. 2/12 biopsies were positive. At the left apex there was a Gleason's 4+4 equals 8 cancer involving 50% of the biopsy. In the left mid gland there was a Gleason's 4+3 involving less than 10% of the biopsied material. The rest of the biopsies were negative.   The patient ended up having a preliminary discussion with his outside urologist apparently was going to be set up to discuss things with one of the robotic surgeons as well as a radiation oncologist. He really has not had a very thorough discussion about his current clinical situation and/or treatment options at this point. He does have diabetes mellitus and has had poor glucose control recently. He tells me he has been working to improve that. He does have a appointment upcoming with his primary care physician in early September. From a voiding standpoint he has considerable problems with urinary frequency urgency and nocturia. AUA symptom score is 16 with a  bothersome score of 6. He has very poor erectile functioning and has a current sexual health score of 5/25. He has not been able to have intercourse for at least one year. The patient did have a bone scan ordered by Dr. Arlan Organ. This failed to show any evidence of metastatic disease. This was not surprising given his PSA of less than 10. The patient did have a recent hospitalization due to some dizziness. I reviewed his discharge summary as well as records from [facility].   Past Medical History Problems  1. History of  Diabetes Mellitus 250.00 2. History of  Heartburn 787.1 3. History of  Hypercholesterolemia 272.0 4. History of  Hypertension 401.9 5. History of  Stroke Syndrome 436  Surgical History Problems  1. History of  Hernia Repair 2. History of  Rotator Cuff Repair  Current Meds 1. Atorvastatin Calcium 80 MG Oral Tablet; Therapy: (Recorded:26Aug2014) to 2. Carvedilol TABS; Therapy: (Recorded:26Aug2014) to 3. Lantus SOLN; Therapy: (Recorded:26Aug2014) to 4. MetFORMIN HCl TABS; Therapy: (Recorded:26Aug2014) to 5. NexIUM 40 MG Oral Capsule Delayed Release; Therapy: (Recorded:26Aug2014) to 6. NovoLOG SOLN; Therapy: (Recorded:26Aug2014) to  Allergies Medication  1. No Known Drug Allergies  Family History Problems  1. Paternal history of  Death In The Family Father 2. Maternal history of  Death In The Family Mother 3. Family history of  Diabetes Mellitus V18.0 4. Family history of  Family Health Status Number Of Children 1 son and 1 daughter 5. Fraternal history of  Prostate Cancer V16.42  Social History Problems  Caffeine Use 1 qd   Former Smoker V15.82 1 ppd for 12yrs, non smoker for the past 15yr   Marital History - Currently Married Denied    History of  Alcohol Use  Review of Systems Genitourinary, constitutional, skin, eye, otolaryngeal, hematologic/lymphatic, cardiovascular, pulmonary, endocrine, musculoskeletal, gastrointestinal, neurological  and psychiatric system(s) were reviewed and pertinent findings if present are noted.    Vitals  BMI Calculated: 25.27 BSA Calculated: 1.84 Height: 5 ft 7 in Weight: 161 lb  Blood Pressure: 146 / 87 Temperature: 97.7 F Heart Rate: 91  Physical Exam Constitutional: Well nourished and well developed . No acute distress.  ENT:. The ears and nose are normal in appearance.  Neck: The appearance of the neck is normal and no neck mass is present.  Pulmonary: No respiratory distress and normal respiratory rhythm and effort.  Cardiovascular: Heart rate and rhythm are normal . No peripheral edema.  Abdomen: The abdomen is soft and nontender. No masses are palpated. No CVA tenderness. No hernias are palpable. No hepatosplenomegaly noted.  Rectal: Rectal exam demonstrates normal sphincter tone, no tenderness and no masses. Estimated prostate size is 1+. The prostate has no nodularity and is not tender. The left seminal vesicle is nonpalpable. The right seminal vesicle is nonpalpable. The perineum is normal on inspection.  Genitourinary: Examination of the penis demonstrates no discharge, no masses, no lesions and a normal meatus. The scrotum is without lesions. The right epididymis is palpably normal and non-tender. The left epididymis is palpably normal and non-tender. The right testis is non-tender and without masses. The left testis is non-tender and without masses.  Lymphatics: The femoral and inguinal nodes are not enlarged or tender.  Skin: Normal skin turgor, no visible rash and no visible skin lesions.  Neuro/Psych:. Mood and affect are appropriate.    Results/Data Urine [Data Includes: Last 1 Day]   26Aug2014 COLOR YELLOW  APPEARANCE CLEAR  SPECIFIC GRAVITY 1.025  pH 6.0  GLUCOSE > 1000 mg/dL BILIRUBIN NEG  KETONE NEG mg/dL BLOOD MOD  PROTEIN > 130 mg/dL UROBILINOGEN 0.2 mg/dL NITRITE NEG  LEUKOCYTE ESTERASE NEG  SQUAMOUS EPITHELIAL/HPF NONE SEEN  WBC NONE SEEN WBC/hpf RBC 0-2  RBC/hpf BACTERIA NONE SEEN  CRYSTALS NONE SEEN  CASTS NONE SEEN   Assessment Assessed  1. Prostate Cancer 185 2. 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 Treatment options would include robotic prostatectomy versus external beam radiation therapy with 6-24 months of hormonal therapy. External beam radiation therapy with seed boost would also be an option. My biggest concern with radiation for Mr. Bortle would be is considerable bladder symptoms which would likely worsened substantially. PRE has considerable problems with frequency urgency and nocturia. He needs to discuss better control of his blood sugar/diabetes with his primary care provider. He is interested in proceeding with robotic prostatectomy. I think ultimately this would be the best choice for him.   A total of 50 minutes were spent in the overall care of the patient today with 40 minutes in direct face to face consultation.   The patient was counseled about the natural history of prostate cancer and the standard treatment options that are available for prostate cancer. It was explained to him  how his age and life expectancy, clinical stage, Gleason score, and PSA affect his prognosis, the decision to proceed with additional staging studies, as well as how that information influences recommended treatment strategies. We discussed the roles for active surveillance, radiation therapy, surgical therapy, androgen deprivation, as well as ablative therapy options for the treatment of prostate cancer as appropriate to his individual cancer situation. We discussed the risks and benefits of these options with regard to  their impact on cancer control and also in terms of potential adverse events, complications, and impact on quiality of life particularly related to urinary, bowel, and sexual function. The patient was encouraged to ask questions throughout the discussion today and all questions were answered to his stated satisfaction. In addition, the patient was provided with and/or directed to appropriate resources and literature for further education about prostate cancer and treatment options.   We discussed surgical therapy for prostate cancer including the different available surgical approaches. We discussed, in detail, the risks and expectations of surgery with regard to cancer control, urinary control, and erectile function as well as the expected postoperative recovery process. The risks, potential complications/adverse events of radical prostatectomy as well as alternative options were explained to the patient.   We discussed surgical therapy for prostate cancer including the different available surgical approaches. We discussed, in detail, the risks and expectations of surgery with regard to cancer control, urinary control, and erectile function as well as the expected postoperative recovery process. Additional risks of surgery including but not limited to bleeding, infection, hernia formation, nerve damage, lymphocele formation, bowel/rectal injury potentially necessitating colostomy, damage to the urinary tract resulting in urine leakage, urethral stricture, and the cardiopulmonary risks such as myocardial infarction, stroke, death, venothromboembolism, etc. were explained. The risk of open surgical conversion for robotic/laparoscopic prostatectomy was also discussed.

## 2013-01-18 NOTE — Telephone Encounter (Signed)
Notified pt and he voices understanding. 

## 2013-01-19 ENCOUNTER — Encounter (HOSPITAL_COMMUNITY)
Admission: RE | Disposition: A | Discharge status: Home / Self Care | Payer: Self-pay | Source: Ambulatory Visit | Attending: Urology

## 2013-01-19 ENCOUNTER — Encounter: Payer: Self-pay | Admitting: Family

## 2013-01-19 ENCOUNTER — Inpatient Hospital Stay (HOSPITAL_COMMUNITY)
Admission: RE | Admit: 2013-01-19 | Discharge: 2013-01-20 | DRG: 708 | Disposition: A | Discharge status: Home / Self Care | Payer: Medicare Other | Source: Ambulatory Visit | Attending: Urology | Admitting: Urology

## 2013-01-19 ENCOUNTER — Inpatient Hospital Stay (HOSPITAL_COMMUNITY): Payer: Medicare Other | Admitting: Anesthesiology

## 2013-01-19 ENCOUNTER — Encounter (HOSPITAL_COMMUNITY): Payer: Medicare Other | Admitting: Anesthesiology

## 2013-01-19 ENCOUNTER — Encounter (HOSPITAL_COMMUNITY): Payer: Self-pay | Admitting: *Deleted

## 2013-01-19 DIAGNOSIS — Z8673 Personal history of transient ischemic attack (TIA), and cerebral infarction without residual deficits: Secondary | ICD-10-CM

## 2013-01-19 DIAGNOSIS — N318 Other neuromuscular dysfunction of bladder: Secondary | ICD-10-CM | POA: Diagnosis present

## 2013-01-19 DIAGNOSIS — Z87891 Personal history of nicotine dependence: Secondary | ICD-10-CM

## 2013-01-19 DIAGNOSIS — I10 Essential (primary) hypertension: Secondary | ICD-10-CM | POA: Diagnosis present

## 2013-01-19 DIAGNOSIS — C61 Malignant neoplasm of prostate: Principal | ICD-10-CM | POA: Diagnosis present

## 2013-01-19 DIAGNOSIS — E7800 Pure hypercholesterolemia, unspecified: Secondary | ICD-10-CM | POA: Diagnosis present

## 2013-01-19 DIAGNOSIS — Z794 Long term (current) use of insulin: Secondary | ICD-10-CM

## 2013-01-19 DIAGNOSIS — E119 Type 2 diabetes mellitus without complications: Secondary | ICD-10-CM | POA: Diagnosis present

## 2013-01-19 DIAGNOSIS — Z79899 Other long term (current) drug therapy: Secondary | ICD-10-CM

## 2013-01-19 DIAGNOSIS — D509 Iron deficiency anemia, unspecified: Secondary | ICD-10-CM | POA: Diagnosis present

## 2013-01-19 DIAGNOSIS — N529 Male erectile dysfunction, unspecified: Secondary | ICD-10-CM | POA: Diagnosis present

## 2013-01-19 HISTORY — PX: LYMPHADENECTOMY: SHX5960

## 2013-01-19 HISTORY — PX: ROBOT ASSISTED LAPAROSCOPIC RADICAL PROSTATECTOMY: SHX5141

## 2013-01-19 LAB — GLUCOSE, CAPILLARY
Glucose-Capillary: 226 mg/dL — ABNORMAL HIGH (ref 70–99)
Glucose-Capillary: 257 mg/dL — ABNORMAL HIGH (ref 70–99)
Glucose-Capillary: 238 mg/dL — ABNORMAL HIGH (ref 70–99)

## 2013-01-19 LAB — TYPE AND SCREEN: Antibody Screen: NEGATIVE

## 2013-01-19 LAB — HEMOGLOBIN AND HEMATOCRIT, BLOOD: Hemoglobin: 10.6 g/dL — ABNORMAL LOW (ref 13.0–17.0)

## 2013-01-19 SURGERY — ROBOTIC ASSISTED LAPAROSCOPIC RADICAL PROSTATECTOMY
Anesthesia: General | Wound class: Clean Contaminated

## 2013-01-19 MED ORDER — MIDAZOLAM HCL 5 MG/5ML IJ SOLN
INTRAMUSCULAR | Status: DC | PRN
  Administered 2013-01-19: 2 mg via INTRAVENOUS

## 2013-01-19 MED ORDER — HYDROCODONE-ACETAMINOPHEN 5-325 MG PO TABS: 1.0000 | ORAL_TABLET | Freq: Four times a day (QID) | ORAL | Status: DC | PRN

## 2013-01-19 MED ORDER — NEOSTIGMINE METHYLSULFATE 1 MG/ML IJ SOLN
INTRAMUSCULAR | Status: DC | PRN
  Administered 2013-01-19: 5 mg via INTRAVENOUS

## 2013-01-19 MED ORDER — LACTATED RINGERS IV SOLN: INTRAVENOUS | Status: DC

## 2013-01-19 MED ORDER — CARVEDILOL 3.125 MG PO TABS
3.1250 mg | ORAL_TABLET | Freq: Once | ORAL | Status: AC
  Administered 2013-01-19: 3.125 mg via ORAL
  Filled 2013-01-19: qty 1

## 2013-01-19 MED ORDER — ONDANSETRON HCL 4 MG/2ML IJ SOLN
INTRAMUSCULAR | Status: DC | PRN
  Administered 2013-01-19: 4 mg via INTRAVENOUS

## 2013-01-19 MED ORDER — LACTATED RINGERS IV SOLN
INTRAVENOUS | Status: DC | PRN
  Administered 2013-01-19: 09:00:00

## 2013-01-19 MED ORDER — HYDROMORPHONE HCL PF 1 MG/ML IJ SOLN
INTRAMUSCULAR | Status: DC | PRN
  Administered 2013-01-19: 1 mg via INTRAVENOUS
  Administered 2013-01-19 (×2): 0.5 mg via INTRAVENOUS

## 2013-01-19 MED ORDER — PROPOFOL 10 MG/ML IV BOLUS
INTRAVENOUS | Status: DC | PRN
  Administered 2013-01-19: 150 mg via INTRAVENOUS

## 2013-01-19 MED ORDER — PANTOPRAZOLE SODIUM 40 MG PO TBEC
40.0000 mg | DELAYED_RELEASE_TABLET | Freq: Every day | ORAL | Status: DC
  Administered 2013-01-19 – 2013-01-20 (×2): 40 mg via ORAL
  Filled 2013-01-19 (×2): qty 1

## 2013-01-19 MED ORDER — HEPARIN SODIUM (PORCINE) 1000 UNIT/ML IJ SOLN
INTRAMUSCULAR | Status: AC
  Filled 2013-01-19: qty 1

## 2013-01-19 MED ORDER — INSULIN GLARGINE 100 UNIT/ML ~~LOC~~ SOLN
45.0000 [IU] | Freq: Every day | SUBCUTANEOUS | Status: DC
  Administered 2013-01-19: 45 [IU] via SUBCUTANEOUS
  Filled 2013-01-19 (×2): qty 0.45

## 2013-01-19 MED ORDER — GLYCOPYRROLATE 0.2 MG/ML IJ SOLN
INTRAMUSCULAR | Status: DC | PRN
  Administered 2013-01-19: .8 mg via INTRAVENOUS

## 2013-01-19 MED ORDER — CARVEDILOL 3.125 MG PO TABS
3.1250 mg | ORAL_TABLET | Freq: Two times a day (BID) | ORAL | Status: DC
  Administered 2013-01-19 – 2013-01-20 (×2): 3.125 mg via ORAL
  Filled 2013-01-19 (×4): qty 1

## 2013-01-19 MED ORDER — CIPROFLOXACIN HCL 500 MG PO TABS: 500.0000 mg | ORAL_TABLET | Freq: Two times a day (BID) | ORAL | Status: DC

## 2013-01-19 MED ORDER — PROMETHAZINE HCL 25 MG/ML IJ SOLN: 6.2500 mg | INTRAMUSCULAR | Status: DC | PRN

## 2013-01-19 MED ORDER — STERILE WATER FOR IRRIGATION IR SOLN
Status: DC | PRN
  Administered 2013-01-19: 3000 mL

## 2013-01-19 MED ORDER — MENTHOL 3 MG MT LOZG
1.0000 | LOZENGE | OROMUCOSAL | Status: DC | PRN
  Administered 2013-01-19 – 2013-01-20 (×2): 3 mg via ORAL
  Filled 2013-01-19: qty 9

## 2013-01-19 MED ORDER — GABAPENTIN 300 MG PO CAPS
300.0000 mg | ORAL_CAPSULE | Freq: Every day | ORAL | Status: DC
  Administered 2013-01-19: 22:00:00 300 mg via ORAL
  Filled 2013-01-19 (×2): qty 1

## 2013-01-19 MED ORDER — KETOROLAC TROMETHAMINE 30 MG/ML IJ SOLN
30.0000 mg | Freq: Four times a day (QID) | INTRAMUSCULAR | Status: DC
  Administered 2013-01-19 – 2013-01-20 (×4): 30 mg via INTRAVENOUS
  Filled 2013-01-19 (×6): qty 1

## 2013-01-19 MED ORDER — BUPIVACAINE-EPINEPHRINE PF 0.25-1:200000 % IJ SOLN
INTRAMUSCULAR | Status: AC
  Filled 2013-01-19: qty 30

## 2013-01-19 MED ORDER — NITROGLYCERIN 0.4 MG SL SUBL: 0.4000 mg | SUBLINGUAL_TABLET | SUBLINGUAL | Status: DC | PRN

## 2013-01-19 MED ORDER — EPHEDRINE SULFATE 50 MG/ML IJ SOLN
INTRAMUSCULAR | Status: DC | PRN
  Administered 2013-01-19: 10 mg via INTRAVENOUS

## 2013-01-19 MED ORDER — DEXAMETHASONE SODIUM PHOSPHATE 10 MG/ML IJ SOLN
INTRAMUSCULAR | Status: DC | PRN
  Administered 2013-01-19: 10 mg via INTRAVENOUS

## 2013-01-19 MED ORDER — CEFAZOLIN SODIUM-DEXTROSE 2-3 GM-% IV SOLR
INTRAVENOUS | Status: AC
  Filled 2013-01-19: qty 50

## 2013-01-19 MED ORDER — HYDROMORPHONE HCL PF 1 MG/ML IJ SOLN: 0.2500 mg | INTRAMUSCULAR | Status: DC | PRN

## 2013-01-19 MED ORDER — ATORVASTATIN CALCIUM 80 MG PO TABS
80.0000 mg | ORAL_TABLET | Freq: Every morning | ORAL | Status: DC
  Administered 2013-01-19 – 2013-01-20 (×2): 80 mg via ORAL
  Filled 2013-01-19 (×2): qty 1

## 2013-01-19 MED ORDER — METHYLENE BLUE 1 % INJ SOLN
INTRAMUSCULAR | Status: AC
  Filled 2013-01-19: qty 10

## 2013-01-19 MED ORDER — BUPIVACAINE-EPINEPHRINE 0.25% -1:200000 IJ SOLN
INTRAMUSCULAR | Status: DC | PRN
  Administered 2013-01-19: 30 mL

## 2013-01-19 MED ORDER — HYDROCODONE-ACETAMINOPHEN 5-325 MG PO TABS
1.0000 | ORAL_TABLET | ORAL | Status: DC | PRN
  Administered 2013-01-20: 2 via ORAL
  Filled 2013-01-19: qty 2

## 2013-01-19 MED ORDER — SODIUM CHLORIDE 0.9 % IV BOLUS (SEPSIS)
1000.0000 mL | Freq: Once | INTRAVENOUS | Status: AC
  Administered 2013-01-19: 1000 mL via INTRAVENOUS

## 2013-01-19 MED ORDER — CEFAZOLIN SODIUM-DEXTROSE 2-3 GM-% IV SOLR
2.0000 g | INTRAVENOUS | Status: AC
  Administered 2013-01-19: 2 g via INTRAVENOUS

## 2013-01-19 MED ORDER — MORPHINE SULFATE 2 MG/ML IJ SOLN: 2.0000 mg | INTRAMUSCULAR | Status: DC | PRN

## 2013-01-19 MED ORDER — SODIUM CHLORIDE 0.45 % IV SOLN
INTRAVENOUS | Status: DC
  Administered 2013-01-19 – 2013-01-20 (×2): via INTRAVENOUS

## 2013-01-19 MED ORDER — ACETAMINOPHEN 325 MG PO TABS: 650.0000 mg | ORAL_TABLET | ORAL | Status: DC | PRN

## 2013-01-19 MED ORDER — SODIUM CHLORIDE 0.9 % IR SOLN
Status: DC | PRN
  Administered 2013-01-19: 1000 mL via INTRAVESICAL

## 2013-01-19 MED ORDER — CISATRACURIUM BESYLATE (PF) 10 MG/5ML IV SOLN
INTRAVENOUS | Status: DC | PRN
  Administered 2013-01-19: 10 mg via INTRAVENOUS
  Administered 2013-01-19: 4 mg via INTRAVENOUS
  Administered 2013-01-19: 3 mg via INTRAVENOUS

## 2013-01-19 MED ORDER — INSULIN ASPART 100 UNIT/ML ~~LOC~~ SOLN
5.0000 [IU] | Freq: Three times a day (TID) | SUBCUTANEOUS | Status: DC
  Administered 2013-01-19 – 2013-01-20 (×3): 5 [IU] via SUBCUTANEOUS

## 2013-01-19 MED ORDER — FENTANYL CITRATE 0.05 MG/ML IJ SOLN
INTRAMUSCULAR | Status: DC | PRN
  Administered 2013-01-19: 100 ug via INTRAVENOUS
  Administered 2013-01-19 (×3): 50 ug via INTRAVENOUS

## 2013-01-19 MED ORDER — METHYLENE BLUE 1 % INJ SOLN
INTRAMUSCULAR | Status: DC | PRN
  Administered 2013-01-19: 5 mL via INTRAVENOUS

## 2013-01-19 MED ORDER — LACTATED RINGERS IV SOLN
INTRAVENOUS | Status: DC | PRN
  Administered 2013-01-19: 08:00:00 via INTRAVENOUS

## 2013-01-19 MED ORDER — PHENOL 1.4 % MT LIQD
1.0000 | OROMUCOSAL | Status: DC | PRN
  Administered 2013-01-19: 1 via OROMUCOSAL
  Filled 2013-01-19: qty 177

## 2013-01-19 MED ORDER — LISINOPRIL 40 MG PO TABS
40.0000 mg | ORAL_TABLET | Freq: Every morning | ORAL | Status: DC
  Administered 2013-01-19 – 2013-01-20 (×2): 40 mg via ORAL
  Filled 2013-01-19 (×2): qty 1

## 2013-01-19 MED ORDER — INSULIN LISPRO 100 UNIT/ML ~~LOC~~ SOLN
5.0000 [IU] | Freq: Three times a day (TID) | SUBCUTANEOUS | Status: DC
  Filled 2013-01-19: qty 10

## 2013-01-19 SURGICAL SUPPLY — 47 items
CANISTER SUCTION 2500CC (MISCELLANEOUS) ×3 IMPLANT
CATH FOLEY 2WAY SLVR 18FR 30CC (CATHETERS) ×3 IMPLANT
CATH ROBINSON RED A/P 16FR (CATHETERS) ×3 IMPLANT
CATH ROBINSON RED A/P 8FR (CATHETERS) ×3 IMPLANT
CATH TIEMANN FOLEY 18FR 5CC (CATHETERS) ×3 IMPLANT
CHLORAPREP W/TINT 26ML (MISCELLANEOUS) ×3 IMPLANT
CLIP LIGATING HEM O LOK PURPLE (MISCELLANEOUS) IMPLANT
CLOTH BEACON ORANGE TIMEOUT ST (SAFETY) ×3 IMPLANT
CORD HIGH FREQUENCY UNIPOLAR (ELECTROSURGICAL) ×3 IMPLANT
COVER SURGICAL LIGHT HANDLE (MISCELLANEOUS) ×3 IMPLANT
COVER TIP SHEARS 8 DVNC (MISCELLANEOUS) ×2 IMPLANT
COVER TIP SHEARS 8MM DA VINCI (MISCELLANEOUS) ×1
CUTTER ECHEON FLEX ENDO 45 340 (ENDOMECHANICALS) ×3 IMPLANT
DECANTER SPIKE VIAL GLASS SM (MISCELLANEOUS) IMPLANT
DRAPE SURG IRRIG POUCH 19X23 (DRAPES) ×3 IMPLANT
DRSG TEGADERM 2-3/8X2-3/4 SM (GAUZE/BANDAGES/DRESSINGS) ×12 IMPLANT
DRSG TEGADERM 4X4.75 (GAUZE/BANDAGES/DRESSINGS) ×6 IMPLANT
DRSG TEGADERM 6X8 (GAUZE/BANDAGES/DRESSINGS) ×6 IMPLANT
ELECT REM PT RETURN 9FT ADLT (ELECTROSURGICAL) ×3
ELECTRODE REM PT RTRN 9FT ADLT (ELECTROSURGICAL) ×2 IMPLANT
GAUZE SPONGE 2X2 8PLY STRL LF (GAUZE/BANDAGES/DRESSINGS) ×2 IMPLANT
GLOVE BIO SURGEON STRL SZ 6.5 (GLOVE) ×3 IMPLANT
GLOVE BIOGEL M STRL SZ7.5 (GLOVE) ×6 IMPLANT
GOWN PREVENTION PLUS LG XLONG (DISPOSABLE) ×3 IMPLANT
GOWN STRL REIN XL XLG (GOWN DISPOSABLE) ×6 IMPLANT
HOLDER FOLEY CATH W/STRAP (MISCELLANEOUS) ×3 IMPLANT
IV LACTATED RINGERS 1000ML (IV SOLUTION) ×3 IMPLANT
KIT ACCESSORY DA VINCI DISP (KITS) ×1
KIT ACCESSORY DVNC DISP (KITS) ×2 IMPLANT
NDL SAFETY ECLIPSE 18X1.5 (NEEDLE) ×2 IMPLANT
NEEDLE HYPO 18GX1.5 SHARP (NEEDLE) ×1
PACK ROBOT UROLOGY CUSTOM (CUSTOM PROCEDURE TRAY) ×3 IMPLANT
RELOAD GREEN ECHELON 45 (STAPLE) ×3 IMPLANT
SCISSORS LAP 5X45 EPIX DISP (ENDOMECHANICALS) ×3 IMPLANT
SEALER TISSUE G2 CVD JAW 45CM (ENDOMECHANICALS) ×3 IMPLANT
SET TUBE IRRIG SUCTION NO TIP (IRRIGATION / IRRIGATOR) ×3 IMPLANT
SOLUTION ELECTROLUBE (MISCELLANEOUS) ×3 IMPLANT
SPONGE GAUZE 2X2 STER 10/PKG (GAUZE/BANDAGES/DRESSINGS) ×1
SUT ETHILON 3 0 PS 1 (SUTURE) ×3 IMPLANT
SUT VIC AB 2-0 SH 27 (SUTURE) ×1
SUT VIC AB 2-0 SH 27X BRD (SUTURE) ×2 IMPLANT
SUT VICRYL 0 UR6 27IN ABS (SUTURE) ×3 IMPLANT
SUT VLOC BARB 180 ABS3/0GR12 (SUTURE) ×6
SUTURE VLOC BRB 180 ABS3/0GR12 (SUTURE) ×4 IMPLANT
SYR 27GX1/2 1ML LL SAFETY (SYRINGE) ×3 IMPLANT
TOWEL OR NON WOVEN STRL DISP B (DISPOSABLE) ×3 IMPLANT
WATER STERILE IRR 1500ML POUR (IV SOLUTION) ×6 IMPLANT

## 2013-01-19 NOTE — Progress Notes (Signed)
Patient ID: [name], male   DOB: 05-27-1945, 67 y.o.   MRN: [id number] Post-op note  Subjective: The patient is doing well.  No complaints.  Very sleepy.  Has not amb yet.  Pressure has been elevated and he received his PO BP meds approx 1 hour ago.  Objective: Vital signs in last 24 hours: Temp:  [97.4 F (36.3 C)-97.6 F (36.4 C)] 97.4 F (36.3 C) (11/05 1230) Pulse Rate:  [90-106] 90 (11/05 1314) Resp:  [13-18] 14 (11/05 1314) BP: (140-187)/(84-105) 187/98 mmHg (11/05 1314) SpO2:  [99 %-100 %] 99 % (11/05 1314) Weight:  [74.844 kg (165 lb)] 74.844 kg (165 lb) (11/05 1417)  Intake/Output from previous day:   Intake/Output this shift: Total I/O In: 800 [I.V.:800] Out: 290 [Urine:250; Drains:40]  Physical Exam:  General: Alert and oriented. Abdomen: Soft, Nondistended. Incisions: Clean and dry.  Lab Results:  Recent Labs  01/19/13 1216  HGB 10.6*  HCT 30.1*    Assessment/Plan: POD#0   1) Continue to monitor 2) Monitor BP 3) DVT prophy, clears, IS, amb, pain control    LOS: 0 days   YARBROUGH,Dontrel Smethers G. 01/19/2013, 3:18 PM

## 2013-01-19 NOTE — Interval H&P Note (Signed)
History and Physical Interval Note:  01/19/2013 8:28 AM  [name]  has presented today for surgery, with the diagnosis of PROSTATE CANCER  The various methods of treatment have been discussed with the patient and family. After consideration of risks, benefits and other options for treatment, the patient has consented to  Procedure(s): ROBOTIC ASSISTED LAPAROSCOPIC RADICAL PROSTATECTOMY (N/A) LYMPHADENECTOMY (Bilateral) as a surgical intervention .  The patient's history has been reviewed, patient examined, no change in status, stable for surgery.  I have reviewed the patient's chart and labs.  Questions were answered to the patient's satisfaction.     Kevonte Vanecek S

## 2013-01-19 NOTE — Anesthesia Postprocedure Evaluation (Signed)
Anesthesia Post Note  Patient: [name]  Procedure(s) Performed: Procedure(s) (LRB): ROBOTIC ASSISTED LAPAROSCOPIC RADICAL PROSTATECTOMY (N/A) LYMPHADENECTOMY (Bilateral)  Anesthesia type: General  Patient location: PACU  Post pain: Pain level controlled  Post assessment: Post-op Vital signs reviewed  Last Vitals:  Filed Vitals:   01/19/13 1215  BP: 184/105  Pulse:   Temp:   Resp:     Post vital signs: Reviewed  Level of consciousness: sedated  Complications: No apparent anesthesia complications

## 2013-01-19 NOTE — Op Note (Signed)
Preoperative diagnosis: Clinical stage T1c Adenocarcinoma prostate  Postoperative diagnosis: Same  Procedure: Robotic-assisted laparoscopic radical retropubic prostatectomy with bilateral pelvic lymph node dissection  Surgeon: Valetta Fuller, MD  Asst.: Pecola Leisure, PA Anesthesia: Gen. Endotracheal  Indications: Patient was diagnosed with clinical stage TIc Adenocarcinoma the prostate. He underwent extensive consultation with regard to treatment options. The patient decided on a surgical approach. He appeared to understand the distinct advantages as well as the disadvantages of this procedure. The patient has performed a mechanical bowel prep. He has had placement of PAS compression boots and has received perioperative antibiotics. The patient had low volume Gleason 8 dz.  Ultrasound revealed a 38 g prostate.   Technique and findings:The patient was brought to the operating room and had successful induction of general endotracheal anesthesia.the patient was placed in a low lithotomy position with careful padding of all extremities. He was secured to the operative table and placed in the steep Trendelenburg position. He was prepped and draped in usual manner. A Foley catheter was placed sterilely on the field. Camera port site was chosen 18 cm above the pubic symphysis just to the left of the umbilicus. A standard open Hassan technique was utilized. A 12 mm trocar was placed without difficulty. The camera was then inserted and no abnormalities were noted within the pelvis. The trochars were placed with direct visual guidance. This included 3 8mm robotic trochars and a 12 mm and 5 mm assist ports. Once all the ports were placed the robot was docked. The bladder was filled and the space of Retzius was developed with electrocautery dissection as well as blunt dissection. Superficial fat off the endopelvic fascia and bladder neck was removed with electrocautery scissors. The endopelvic fascia was then  incised bilaterally from base to apex. Levator musculature was swept off the apex of the prostate isolating the dorsal venous complex which was then stapled with the ETS stapling device. The anterior bladder neck was identified with the aid of the Foley balloon. This was then transected down to the Foley catheter with electrocautery scissors. The Foley catheter was then retracted anteriorly. Indigo carmine was given and we appeared to be well away from the ureteral orifices. The posterior bladder neck was then transected and the dissection carried down to the adnexal structures. The seminal vesicles and vas deferens on both sides were then individually dissected free and retracted anteriorly. The posterior plane between the rectum and prostate was then established primarily with blunt dissection.  Attention was then turned towards nerve sparing. The patient was felt to be a candidate for right-sided nerve sparing and  Very limited left-sided nerve sparing. Superficial fascia along the anterior lateral aspect of the prostate was incised bilaterally. This tissue was then swept laterally until we were able to establish a groove between the neurovascular tissue and the posterior lateral aspect on the prostate bilaterally. This groove was then extended from the apex back to the base of the prostate. With the prostate retracted anteriorly the vascular pedicles of the prostate were taken with the Enseal device. The Foley catheter was then reinserted and the anterior urethra was transected. The posterior urethra was then transected as were some rectourethralis fibers. The prostate was then removed from the pelvis. The pelvis was then copiously irrigated. Rectal insufflation was performed and there was no evidence of rectal injury.  Attention was then turned towards bilateral pelvic lymph node dissection. The obturator node packets were removed I laterally and the dissection extended towards the bifurcation of  the iliac  artery. The obturator nerve was identified on both sides and preserved. Hemalock clips were used for small veins and lymphatic channels. The node packets were sent for permanent analysis.  Attention was then turned towards reconstruction. The bladder neck did not require any reconstruction. The bladder neck and posterior urethra were reapproximated at the 6:00 position utilizing a 2-0 Vicryl suture. The rest of the anastomosis was done with a double-armed 3-0 V-lock suture in a 360 degree manner. A new catheter was placed and bladder irrigation revealed no evidence of leakage. A Blake drain was placed through one of the robotic trochars and positioned in the retropubic space above the anastomosis. This was then secured to the skin with a nylon suture. The prostate was placed in the Endopouch retrieval bag. The 12 mm trocar site was closed with a Vicryl suture with the aid of a suture passer. Our other trochars were taken out with direct visual guidance without evidence of any bleeding. The camera port incision was extended slightly to allow for removal of the specimen and then closed with a running Vicryl suture. All port sites were infiltrated with Marcaine and then closed with surgical clips. The patient was then taken to recovery room having had no obvious complications or problems. Sponge and needle counts were correct.

## 2013-01-19 NOTE — Transfer of Care (Signed)
Immediate Anesthesia Transfer of Care Note  Patient: [name]  Procedure(s) Performed: Procedure(s): ROBOTIC ASSISTED LAPAROSCOPIC RADICAL PROSTATECTOMY (N/A) LYMPHADENECTOMY (Bilateral)  Patient Location: PACU  Anesthesia Type:General  Level of Consciousness: awake, sedated and patient cooperative  Airway & Oxygen Therapy: Patient Spontanous Breathing and Patient connected to face mask oxygen  Post-op Assessment: Report given to PACU RN and Post -op Vital signs reviewed and stable  Post vital signs: Reviewed and stable  Complications: No apparent anesthesia complications

## 2013-01-19 NOTE — Progress Notes (Signed)
Utilization review completed.  

## 2013-01-20 ENCOUNTER — Encounter (HOSPITAL_COMMUNITY): Payer: Self-pay | Admitting: Urology

## 2013-01-20 LAB — GLUCOSE, CAPILLARY
Glucose-Capillary: 227 mg/dL — ABNORMAL HIGH (ref 70–99)
Glucose-Capillary: 228 mg/dL — ABNORMAL HIGH (ref 70–99)
Glucose-Capillary: 136 mg/dL — ABNORMAL HIGH (ref 70–99)

## 2013-01-20 LAB — BASIC METABOLIC PANEL
CO2: 23 mEq/L (ref 19–32)
Calcium: 8.2 mg/dL — ABNORMAL LOW (ref 8.4–10.5)
Chloride: 97 mEq/L (ref 96–112)
Creatinine, Ser: 2 mg/dL — ABNORMAL HIGH (ref 0.50–1.35)
GFR calc Af Amer: 38 mL/min — ABNORMAL LOW (ref >90)
Glucose, Bld: 256 mg/dL — ABNORMAL HIGH (ref 70–99)
Potassium: 3.5 mEq/L (ref 3.5–5.1)

## 2013-01-20 LAB — HEMOGLOBIN AND HEMATOCRIT, BLOOD: HCT: 29 % — ABNORMAL LOW (ref 39.0–52.0)

## 2013-01-20 MED ORDER — ALUM & MAG HYDROXIDE-SIMETH 200-200-20 MG/5ML PO SUSP
30.0000 mL | Freq: Four times a day (QID) | ORAL | Status: DC | PRN
  Administered 2013-01-20: 05:00:00 30 mL via ORAL
  Filled 2013-01-20: qty 30

## 2013-01-20 NOTE — Progress Notes (Signed)
Patient ID: [name], male   DOB: March 14, 1946, 67 y.o.   MRN: [id number] 1 Day Post-Op Subjective: Patient reports doing well. He has no significant discomfort at this time. He has had some flatus this morning.   Creatinine has increased to 2.0. Urine output borderline. Urine is clear with blue color from the methylene blue Drain output is serosanguineous without blue discoloration. Objective: Vital signs in last 24 hours: Temp:  [97.4 F (36.3 C)-98.8 F (37.1 C)] 98.6 F (37 C) (11/06 0607) Pulse Rate:  [90-100] 96 (11/06 0607) Resp:  [13-20] 18 (11/06 0607) BP: (120-187)/(62-105) 128/64 mmHg (11/06 0607) SpO2:  [97 %-100 %] 99 % (11/06 0607) Weight:  [74.844 kg (165 lb)] 74.844 kg (165 lb) (11/05 1417)  Intake/Output from previous day: 11/05 0701 - 11/06 0700 In: 3465.4 [P.O.:480; I.V.:2985.4] Out: 930 [Urine:825; Drains:105] Intake/Output this shift:    Physical Exam:  Constitutional: Vital signs reviewed. WD WN in NAD   Eyes: PERRL, No scleral icterus.   Cardiovascular: RRR Pulmonary/Chest: Normal effort Abdominal: Soft. Non-tender, non-distended, bowel sounds are normal, no masses, organomegaly, or guarding present.  Genitourinary: indwelling Foley catheter draining blue urine. Extremities: No cyanosis or edema   Lab Results:  Recent Labs  01/19/13 1216 01/20/13 0509  HGB 10.6* 9.9*  HCT 30.1* 29.0*   BMET  Recent Labs  01/20/13 0509  NA 131*  K 3.5  CL 97  CO2 23  GLUCOSE 256*  BUN 25*  CREATININE 2.00*  CALCIUM 8.2*   No results found for this basename: LABPT, INR,  in the last 72 hours No results found for this basename: LABURIN,  in the last 72 hours Results for orders placed in visit on 01/04/13  URINE CULTURE     Status: None   Collection Time    01/04/13 10:29 AM      Result Value Range Status   Colony Count NO GROWTH   Final   Organism ID, Bacteria NO GROWTH   Final    Studies/Results: No results  found.  Assessment/Plan:   Doing well. Creatinine has increased and there has been some reduction in urine output. We will monitor him through the morning and early afternoon and then make a decision about whether he can be discharged.  We'll probably discontinue JP drain later today. We'll advance diet.   LOS: 1 day   Avel Ogawa S 01/20/2013, 7:40 AM

## 2013-01-20 NOTE — Progress Notes (Signed)
Inpatient Diabetes Program Recommendations  AACE/ADA: New Consensus Statement on Inpatient Glycemic Control (2013)  Target Ranges:  Prepandial:   less than 140 mg/dL      Peak postprandial:   less than 180 mg/dL (1-2 hours)      Critically ill patients:  140 - 180 mg/dL   Reason for Visit: Hyperglycemia  Results for AODHAN, SCHEIDT (MRN [id number]) as of 01/20/2013 10:54  Ref. Range 10/31/2012 08:10  Hemoglobin A1C Latest Range: <5.7 % 9.5 (H)  Results for ORLANDO, DEVEREUX (MRN [id number]) as of 01/20/2013 10:54  Ref. Range 01/19/2013 07:12 01/19/2013 11:50 01/19/2013 16:42 01/19/2013 21:51 01/20/2013 04:16 01/20/2013 07:57  Glucose-Capillary Latest Range: 70-99 mg/dL 578 (H) 469 (H) 629 (H) 238 (H) 227 (H) 228 (H)    Inpatient Diabetes Program Recommendations Correction (SSI): Please add Novolog sensitive tidwc HgbA1C: 9.5% - uncontrolled (may not be accurate with low H/H Outpatient Referral: Will refer to OP Diabetes Education at [facility] for improving glycemic control Diet: Please add CHO mod med diet  Note: Will continue to follow while inpatient.  Thank you. Ailene Ards, RD, LDN, CDE Inpatient Diabetes Coordinator [phone]

## 2013-01-21 ENCOUNTER — Telehealth: Payer: Self-pay | Admitting: Neurology

## 2013-01-21 NOTE — Discharge Summary (Signed)
Physician Discharge Summary  Patient ID: [name] MRN: [id number] DOB/AGE: 1945-12-19 67 y.o.  Admit date: 01/19/2013 Discharge date: 01/20/2013  Admission Diagnoses:  Discharge Diagnoses:  Active Problems:   * No active hospital problems. *   Discharged Condition: good  Hospital Course: [name] was admitted status post robotic-assisted laparoscopic radical retropubic prostatectomy with bilateral pelvic lymph node dissection.  Surgery was uneventful and the root no obvious interoperative complications.  The patient did well postoperatively.  He had adequate urinary output with relatively low JP drainage.  He was able to ambulate without difficulty.  He tolerated a diet well.  We removed his Jackson-Pratt drain on postoperative day 1 and the patient was discharged home with an indwelling catheter.  Pathology report remained pending at the time of discharge.  Consults: none  Significant Diagnostic Studies: routine blood work  Treatments: surgery: robotic prostatectomy with bilateral pelvic lymph node dissection  Discharge Exam: Blood pressure 128/64, pulse 96, temperature 98.6 F (37 C), temperature source Oral, resp. rate 18, height 5\' 7"  (1.702 m), weight 74.844 kg (165 lb), SpO2 99.00%.well-developed well-nourished male in no acute distress.  Respiratory normal effort.  Cardiac regular rate and rhythm.  Abdomen soft.  Incisions without drainage or infection.  Genitourinary exam revealed an indwelling catheter draining light blue urine.  Extremities were without edema  Disposition: 01-Home or Self Care   Future Appointments Provider Department Dept Phone   01/24/2013 8:00 AM [name] [facility] [phone]   01/24/2013 8:30 AM [name], MD [facility] [phone]   01/24/2013 9:00 AM [facility] [place] [facility] [phone]   01/28/2013 8:00 AM Rosendo Gros, RN [facility] [phone]   02/21/2013 8:00 AM Sandford Craze, NP [facility] [phone]   02/24/2013 2:00 PM Ronal Fear, NP [facility] [phone]       Medication List    STOP taking these medications       clopidogrel 75 MG tablet  Commonly known as:  PLAVIX     multivitamin with minerals Tabs tablet     tamsulosin 0.4 MG Caps capsule  Commonly known as:  FLOMAX     Vitamin D 2000 UNITS Caps      TAKE these medications       atorvastatin 80 MG tablet  Commonly known as:  LIPITOR  Take 80 mg by mouth every morning.     carvedilol 3.125 MG tablet  Commonly known as:  COREG  Take 3.125 mg by mouth 2 (two) times daily with a meal.     ciprofloxacin 500 MG tablet  Commonly known as:  CIPRO  Take 1 tablet (500 mg total) by mouth 2 (two) times daily. Start day prior to office visit for foley removal     fluticasone 50 MCG/ACT nasal spray  Commonly known as:  FLONASE  Place 2 sprays into the nose daily.     gabapentin 300 MG capsule  Commonly known as:  NEURONTIN  Take 300 mg by mouth at bedtime.     HYDROcodone-acetaminophen 5-325 MG per tablet  Commonly known as:  NORCO  Take 1-2 tablets by mouth every 6 (six) hours as needed.     insulin glargine 100 UNIT/ML injection  Commonly known as:  LANTUS  Inject 45 Units into the skin at bedtime.     insulin lispro 100  UNIT/ML injection  Commonly known as:  HUMALOG  Inject 5 Units into the skin 3 (three) times daily before meals.     lisinopril 40 MG tablet  Commonly known as:  PRINIVIL,ZESTRIL  Take 40 mg by mouth every morning.     nitroGLYCERIN 0.4 MG SL tablet  Commonly known as:  NITROSTAT  Place 1 tablet (0.4 mg total) under the tongue every 5 (five) minutes as needed for chest pain (up to 3 doses).     pantoprazole 40 MG tablet  Commonly known as:  PROTONIX  Take 1 tablet (40 mg total) by mouth daily.     polyvinyl alcohol 1.4 %  ophthalmic solution  Commonly known as:  LIQUIFILM TEARS  Place 1 drop into both eyes daily as needed (dry eyes).           Follow-up Information   Follow up with Valetta Fuller, MD On 01/27/2013. (at 10:00)    Specialty:  Urology   Contact information:   [address] [place], [address]                         [place] [address] [phone]       Signed: Valetta Fuller 01/21/2013, 5:11 PM

## 2013-01-24 ENCOUNTER — Telehealth: Payer: Self-pay | Admitting: Hematology & Oncology

## 2013-01-24 ENCOUNTER — Ambulatory Visit: Payer: Medicare Other | Admitting: Hematology & Oncology

## 2013-01-24 ENCOUNTER — Other Ambulatory Visit: Payer: Medicare Other | Admitting: Lab

## 2013-01-24 ENCOUNTER — Ambulatory Visit: Payer: Medicare Other

## 2013-01-24 NOTE — Telephone Encounter (Signed)
Pt moved 11-10 to 12-15 he had prostate surgery

## 2013-01-28 ENCOUNTER — Ambulatory Visit: Payer: Medicare Other | Admitting: *Deleted

## 2013-01-29 NOTE — Telephone Encounter (Signed)
I called and left VM for patient's wife. There are a few questions I need to ask of her in order to complete medical form by Monday. Please call then.

## 2013-01-31 ENCOUNTER — Telehealth: Payer: Self-pay | Admitting: Neurology

## 2013-02-01 ENCOUNTER — Other Ambulatory Visit: Payer: Self-pay | Admitting: Family

## 2013-02-01 NOTE — Telephone Encounter (Signed)
Rx request to pharmacy/SLS  

## 2013-02-21 ENCOUNTER — Ambulatory Visit (INDEPENDENT_AMBULATORY_CARE_PROVIDER_SITE_OTHER): Payer: Medicare Other | Admitting: Family

## 2013-02-21 ENCOUNTER — Encounter: Payer: Self-pay | Admitting: Family

## 2013-02-21 VITALS — BP 138/76 | HR 101 | Temp 98.1°F | Ht 67.0 in | Wt 159.0 lb

## 2013-02-21 DIAGNOSIS — E119 Type 2 diabetes mellitus without complications: Secondary | ICD-10-CM

## 2013-02-21 DIAGNOSIS — Z23 Encounter for immunization: Secondary | ICD-10-CM

## 2013-02-21 DIAGNOSIS — IMO0002 Reserved for concepts with insufficient information to code with codable children: Secondary | ICD-10-CM

## 2013-02-21 DIAGNOSIS — E1165 Type 2 diabetes mellitus with hyperglycemia: Secondary | ICD-10-CM

## 2013-02-21 DIAGNOSIS — C61 Malignant neoplasm of prostate: Secondary | ICD-10-CM

## 2013-02-21 DIAGNOSIS — R490 Dysphonia: Secondary | ICD-10-CM

## 2013-02-21 DIAGNOSIS — E785 Hyperlipidemia, unspecified: Secondary | ICD-10-CM

## 2013-02-21 LAB — BASIC METABOLIC PANEL
BUN: 8 mg/dL (ref 6–23)
CO2: 27 mEq/L (ref 19–32)
Calcium: 8.5 mg/dL (ref 8.4–10.5)
Chloride: 101 mEq/L (ref 96–112)
Creat: 1.1 mg/dL (ref 0.50–1.35)
Glucose, Bld: 343 mg/dL — ABNORMAL HIGH (ref 70–99)
Sodium: 137 mEq/L (ref 135–145)

## 2013-02-21 LAB — HEMOGLOBIN A1C
Hgb A1c MFr Bld: 9.9 % — ABNORMAL HIGH (ref <5.7)
Mean Plasma Glucose: 237 mg/dL — ABNORMAL HIGH (ref <117)

## 2013-02-21 LAB — HEPATIC FUNCTION PANEL
ALT: 9 U/L (ref 0–53)
AST: 9 U/L (ref 0–37)
Albumin: 3 g/dL — ABNORMAL LOW (ref 3.5–5.2)
Total Protein: 5.8 g/dL — ABNORMAL LOW (ref 6.0–8.3)

## 2013-02-21 LAB — LIPID PANEL
Cholesterol: 183 mg/dL (ref 0–200)
LDL Cholesterol: 124 mg/dL — ABNORMAL HIGH (ref 0–99)

## 2013-02-21 NOTE — Progress Notes (Signed)
Subjective:    Patient ID: [name], male    DOB: 1945/05/03, 67 y.o.   MRN: [id number]  HPI  [name] is a 67 yr old male who presents today for follow up of multiple medical problems.  DM2-  Follows with Dr. Elvera Lennox. Last visit was 10/16. Hx of noncompliance with insulin regimen.  Reports that the last time he checked sugar was 230. Reports + compliance with insulin.  Lab Results  Component Value Date   HGBA1C 9.5* 10/31/2012   Hyperlipidemia-  Last LDL at goal 8/17. He is maintained on lipitor. Denies myalgia. Lab Results  Component Value Date   CHOL 153 10/31/2012   HDL 29* 10/31/2012   LDLCALC 83 10/31/2012   TRIG 203* 10/31/2012   CHOLHDL 5.3 10/31/2012    CAD-  Maintained on coreg, He does not take aspirin.   Prostate cancer- s/p prostatectomy. Reports normal post operative course. Denies trouble urinating, but does not some dribbling of urine though since surgery.     Review of Systems Reports + voice hoarseness,  Both ears "stopped up." denies otalgia.    Past Medical History  Diagnosis Date  . Hypertension   . High cholesterol   . GERD (gastroesophageal reflux disease)   . GI bleed     a. Recurrent GI bleed, tx with IV iron. b. Per heme notes - likely AVMs 04/2012 (tx with cauterization several months ago).  . Orthostatic hypotension     a. Tx with florinef.  . Hematuria     a. Urology note scan from 07/2012: cystoscopy without evidence for bladder lesion, only lateral hypertrophy of posterior urethra, bladder impression from BPH. b. Pt states he had "some tests" scheduled for later in July 2014.  . Stage III chronic kidney disease     a. Stage 3 (DM with complications ->CKD, peripheral neuropathy).  . Anemia, iron deficiency 03/24/2011    a. Recurrent GI bleed, tx with periodic iron infusions.  . 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 Sleep apnea     "had mask; couldn't sleep in it" (09/20/2012)  . Type II diabetes mellitus     a. Dx 1994, uncontrolled.   .  Diabetic peripheral neuropathy 2014    foot pain.  . Chronic lower back pain   . Fatty liver     on CT of 11/2010  . AVM (arteriovenous malformation) of duodenum, acquired     egds in 01/2012, 04/2011  . Polyp, colonic     Colonoscopy 01/2012 "benign" polyp  . CAD (coronary artery disease)     a. Cath 09/2012: moderate borderline CAD in mid LAD/small diagonal branch, mild RCA stenosis, to be managed medically   . BPH (benign prostatic hyperplasia)   . Prostate cancer   . Stroke   . Peripheral neuropathy     History   Social History  . Marital Status: Married    Spouse Name: N/A    Number of Children: 2  . Years of Education: N/A   Occupational History  . taken out of work due to back    Social History Main Topics  . Smoking status: Former Smoker -- 1.00 packs/day for 48 years    Types: Cigarettes    Quit date: 03/17/2012  . Smokeless tobacco: Never Used  . Alcohol Use: No     Comment: 09/20/2012 "Used to; stopped ~ 2009; never had problem w/it"  . Drug Use: No  . Sexual Activity: No   Other Topics Concern  .  Not on file   Social History Narrative   Regular exercise: rides horses   Caffeine use: occasionally    Past Surgical History  Procedure Laterality Date  . Shoulder open rotator cuff repair Left 1980's  . Inguinal hernia repair Right 2011  . Lumbar disc surgery  1980's  . Robot assisted laparoscopic radical prostatectomy N/A 01/19/2013    Procedure: ROBOTIC ASSISTED LAPAROSCOPIC RADICAL PROSTATECTOMY;  Surgeon: Valetta Fuller, MD;  Location: [facility];  Service: Urology;  Laterality: N/A;  . Lymphadenectomy Bilateral 01/19/2013    Procedure: LYMPHADENECTOMY;  Surgeon: Valetta Fuller, MD;  Location: [facility];  Service: Urology;  Laterality: Bilateral;    Family History  Problem Relation Age of Onset  . Heart attack Brother     Died at 13  . Stroke Father     Died at 35  . Diabetes Sister   . Diabetes Mother   . Stomach cancer Brother   . Heart attack Sister       No Known Allergies  Current Outpatient Prescriptions on File Prior to Visit  Medication Sig Dispense Refill  . atorvastatin (LIPITOR) 80 MG tablet TAKE 1 TABLET EVERY DAY  30 tablet  2  . carvedilol (COREG) 3.125 MG tablet Take 3.125 mg by mouth 2 (two) times daily with a meal.      . ciprofloxacin (CIPRO) 500 MG tablet Take 1 tablet (500 mg total) by mouth 2 (two) times daily. Start day prior to office visit for foley removal  6 tablet  0  . fluticasone (FLONASE) 50 MCG/ACT nasal spray Place 2 sprays into the nose daily.      [name] Kitchen gabapentin (NEURONTIN) 300 MG capsule Take 300 mg by mouth at bedtime.      [name] Kitchen HYDROcodone-acetaminophen (NORCO) 5-325 MG per tablet Take 1-2 tablets by mouth every 6 (six) hours as needed.  30 tablet  0  . insulin glargine (LANTUS) 100 UNIT/ML injection Inject 45 Units into the skin at bedtime.       . insulin lispro (HUMALOG) 100 UNIT/ML injection Inject 5 Units into the skin 3 (three) times daily before meals.      [name] Kitchen lisinopril (PRINIVIL,ZESTRIL) 40 MG tablet Take 40 mg by mouth every morning.      . nitroGLYCERIN (NITROSTAT) 0.4 MG SL tablet Place 1 tablet (0.4 mg total) under the tongue every 5 (five) minutes as needed for chest pain (up to 3 doses).  25 tablet  2  . pantoprazole (PROTONIX) 40 MG tablet Take 1 tablet (40 mg total) by mouth daily.  30 tablet  3  . polyvinyl alcohol (LIQUIFILM TEARS) 1.4 % ophthalmic solution Place 1 drop into both eyes daily as needed (dry eyes).       No current facility-administered medications on file prior to visit.    BP 138/76  Pulse 101  Temp(Src) 98.1 F (36.7 C) (Oral)  Ht 5\' 7"  (1.702 m)  Wt 159 lb (72.122 kg)  BMI 24.90 kg/m2  SpO2 98%       Objective:   Physical Exam  Constitutional: He is oriented to person, place, and time. He appears well-developed and well-nourished. No distress.  HENT:  Head: Normocephalic and atraumatic.  Right Ear: Tympanic membrane and ear canal normal.  Left Ear: Tympanic  membrane and ear canal normal.  Cardiovascular: Normal rate and regular rhythm.   No murmur heard. Pulmonary/Chest: Effort normal and breath sounds normal. No respiratory distress. He has no wheezes. He has no rales. He exhibits no  tenderness.  Musculoskeletal: He exhibits no edema.  Neurological: He is alert and oriented to person, place, and time.  Psychiatric: He has a normal mood and affect. His behavior is normal. Judgment and thought content normal.          Assessment & Plan:

## 2013-02-21 NOTE — Patient Instructions (Addendum)
Please start aspirin 81mg  once daily.  Please complete lab work prior to leaving. Please reschedule your appointment with Dr. Elvera Lennox. Follow up in 3 months.

## 2013-02-21 NOTE — Progress Notes (Signed)
Pre visit review using our clinic review tool, if applicable. No additional management support is needed unless otherwise documented below in the visit note. 

## 2013-02-21 NOTE — Assessment & Plan Note (Addendum)
Still uncontrolled. Follow up A1C  Lab Results  Component Value Date   HGBA1C 9.9* 02/21/2013   Suspect ongoing non-compliance. Advised pt to arrange follow up with Dr. Elvera Lennox

## 2013-02-21 NOTE — Assessment & Plan Note (Signed)
On lipitor, obtain follow up LDL, FLP.

## 2013-02-22 ENCOUNTER — Emergency Department (HOSPITAL_BASED_OUTPATIENT_CLINIC_OR_DEPARTMENT_OTHER): Payer: Medicare Other

## 2013-02-22 ENCOUNTER — Encounter: Payer: Self-pay | Admitting: Nurse Practitioner

## 2013-02-22 ENCOUNTER — Encounter (HOSPITAL_COMMUNITY): Payer: Self-pay | Admitting: Emergency Medicine

## 2013-02-22 ENCOUNTER — Telehealth: Payer: Self-pay | Admitting: Hematology & Oncology

## 2013-02-22 ENCOUNTER — Encounter: Payer: Self-pay | Admitting: Family

## 2013-02-22 ENCOUNTER — Encounter (HOSPITAL_BASED_OUTPATIENT_CLINIC_OR_DEPARTMENT_OTHER): Payer: Self-pay | Admitting: Emergency Medicine

## 2013-02-22 ENCOUNTER — Inpatient Hospital Stay (HOSPITAL_COMMUNITY)
Admission: EM | Admit: 2013-02-22 | Discharge: 2013-02-24 | DRG: 812 | Disposition: A | Discharge status: Home / Self Care | Payer: Medicare Other | Attending: Internal Medicine | Admitting: Internal Medicine

## 2013-02-22 ENCOUNTER — Emergency Department (HOSPITAL_BASED_OUTPATIENT_CLINIC_OR_DEPARTMENT_OTHER)
Admission: EM | Admit: 2013-02-22 | Discharge: 2013-02-22 | Disposition: A | Discharge status: Home / Self Care | Payer: Medicare Other | Source: Home / Self Care | Attending: Emergency Medicine | Admitting: Emergency Medicine

## 2013-02-22 DIAGNOSIS — M549 Dorsalgia, unspecified: Secondary | ICD-10-CM

## 2013-02-22 DIAGNOSIS — Z791 Long term (current) use of non-steroidal anti-inflammatories (NSAID): Secondary | ICD-10-CM

## 2013-02-22 DIAGNOSIS — I129 Hypertensive chronic kidney disease with stage 1 through stage 4 chronic kidney disease, or unspecified chronic kidney disease: Secondary | ICD-10-CM | POA: Diagnosis present

## 2013-02-22 DIAGNOSIS — Z8673 Personal history of transient ischemic attack (TIA), and cerebral infarction without residual deficits: Secondary | ICD-10-CM

## 2013-02-22 DIAGNOSIS — Z87891 Personal history of nicotine dependence: Secondary | ICD-10-CM

## 2013-02-22 DIAGNOSIS — R0982 Postnasal drip: Secondary | ICD-10-CM

## 2013-02-22 DIAGNOSIS — E440 Moderate protein-calorie malnutrition: Secondary | ICD-10-CM | POA: Diagnosis present

## 2013-02-22 DIAGNOSIS — E1149 Type 2 diabetes mellitus with other diabetic neurological complication: Secondary | ICD-10-CM | POA: Diagnosis present

## 2013-02-22 DIAGNOSIS — G909 Disorder of the autonomic nervous system, unspecified: Secondary | ICD-10-CM | POA: Diagnosis present

## 2013-02-22 DIAGNOSIS — E876 Hypokalemia: Secondary | ICD-10-CM | POA: Diagnosis present

## 2013-02-22 DIAGNOSIS — IMO0002 Reserved for concepts with insufficient information to code with codable children: Secondary | ICD-10-CM | POA: Diagnosis present

## 2013-02-22 DIAGNOSIS — Z800 Family history of malignant neoplasm of digestive organs: Secondary | ICD-10-CM

## 2013-02-22 DIAGNOSIS — K5900 Constipation, unspecified: Secondary | ICD-10-CM | POA: Diagnosis present

## 2013-02-22 DIAGNOSIS — C61 Malignant neoplasm of prostate: Secondary | ICD-10-CM | POA: Diagnosis present

## 2013-02-22 DIAGNOSIS — R490 Dysphonia: Secondary | ICD-10-CM

## 2013-02-22 DIAGNOSIS — E1140 Type 2 diabetes mellitus with diabetic neuropathy, unspecified: Secondary | ICD-10-CM | POA: Diagnosis present

## 2013-02-22 DIAGNOSIS — N529 Male erectile dysfunction, unspecified: Secondary | ICD-10-CM

## 2013-02-22 DIAGNOSIS — Z833 Family history of diabetes mellitus: Secondary | ICD-10-CM

## 2013-02-22 DIAGNOSIS — K7689 Other specified diseases of liver: Secondary | ICD-10-CM | POA: Diagnosis present

## 2013-02-22 DIAGNOSIS — E785 Hyperlipidemia, unspecified: Secondary | ICD-10-CM | POA: Diagnosis present

## 2013-02-22 DIAGNOSIS — R634 Abnormal weight loss: Secondary | ICD-10-CM | POA: Diagnosis present

## 2013-02-22 DIAGNOSIS — I2510 Atherosclerotic heart disease of native coronary artery without angina pectoris: Secondary | ICD-10-CM | POA: Diagnosis present

## 2013-02-22 DIAGNOSIS — N183 Chronic kidney disease, stage 3 unspecified: Secondary | ICD-10-CM | POA: Diagnosis present

## 2013-02-22 DIAGNOSIS — D649 Anemia, unspecified: Secondary | ICD-10-CM

## 2013-02-22 DIAGNOSIS — Z9079 Acquired absence of other genital organ(s): Secondary | ICD-10-CM

## 2013-02-22 DIAGNOSIS — I10 Essential (primary) hypertension: Secondary | ICD-10-CM | POA: Diagnosis present

## 2013-02-22 DIAGNOSIS — K922 Gastrointestinal hemorrhage, unspecified: Secondary | ICD-10-CM | POA: Diagnosis present

## 2013-02-22 DIAGNOSIS — Z7982 Long term (current) use of aspirin: Secondary | ICD-10-CM

## 2013-02-22 DIAGNOSIS — B353 Tinea pedis: Secondary | ICD-10-CM

## 2013-02-22 DIAGNOSIS — I951 Orthostatic hypotension: Secondary | ICD-10-CM

## 2013-02-22 DIAGNOSIS — R42 Dizziness and giddiness: Secondary | ICD-10-CM

## 2013-02-22 DIAGNOSIS — K5520 Angiodysplasia of colon without hemorrhage: Secondary | ICD-10-CM

## 2013-02-22 DIAGNOSIS — D631 Anemia in chronic kidney disease: Secondary | ICD-10-CM

## 2013-02-22 DIAGNOSIS — E1165 Type 2 diabetes mellitus with hyperglycemia: Secondary | ICD-10-CM

## 2013-02-22 DIAGNOSIS — Z79899 Other long term (current) drug therapy: Secondary | ICD-10-CM

## 2013-02-22 DIAGNOSIS — D62 Acute posthemorrhagic anemia: Principal | ICD-10-CM | POA: Diagnosis present

## 2013-02-22 DIAGNOSIS — R319 Hematuria, unspecified: Secondary | ICD-10-CM | POA: Diagnosis present

## 2013-02-22 DIAGNOSIS — E1142 Type 2 diabetes mellitus with diabetic polyneuropathy: Secondary | ICD-10-CM | POA: Diagnosis present

## 2013-02-22 DIAGNOSIS — D509 Iron deficiency anemia, unspecified: Secondary | ICD-10-CM | POA: Diagnosis present

## 2013-02-22 DIAGNOSIS — I639 Cerebral infarction, unspecified: Secondary | ICD-10-CM | POA: Diagnosis present

## 2013-02-22 DIAGNOSIS — Z823 Family history of stroke: Secondary | ICD-10-CM

## 2013-02-22 DIAGNOSIS — Z794 Long term (current) use of insulin: Secondary | ICD-10-CM

## 2013-02-22 DIAGNOSIS — Z720 Tobacco use: Secondary | ICD-10-CM

## 2013-02-22 LAB — TROPONIN I: Troponin I: <0.3 ng/mL (ref <0.30)

## 2013-02-22 LAB — CBC WITH DIFFERENTIAL/PLATELET
Basophils Absolute: 0 10*3/uL (ref 0.0–0.1)
Basophils Absolute: 0 10*3/uL (ref 0.0–0.1)
Basophils Relative: 1 % (ref 0–1)
Basophils Relative: 1 % (ref 0–1)
Eosinophils Absolute: 0.1 10*3/uL (ref 0.0–0.7)
Eosinophils Relative: 3 % (ref 0–5)
HCT: 20.6 % — ABNORMAL LOW (ref 39.0–52.0)
HCT: 20.9 % — ABNORMAL LOW (ref 39.0–52.0)
Hemoglobin: 7 g/dL — ABNORMAL LOW (ref 13.0–17.0)
Hemoglobin: 7 g/dL — ABNORMAL LOW (ref 13.0–17.0)
Lymphocytes Relative: 14 % (ref 12–46)
Lymphocytes Relative: 17 % (ref 12–46)
Lymphs Abs: 0.6 10*3/uL — ABNORMAL LOW (ref 0.7–4.0)
MCH: 28.2 pg (ref 26.0–34.0)
MCHC: 33.5 g/dL (ref 30.0–36.0)
MCHC: 34 g/dL (ref 30.0–36.0)
MCV: 83.1 fL (ref 78.0–100.0)
Monocytes Absolute: 0.6 10*3/uL (ref 0.1–1.0)
Monocytes Relative: 13 % — ABNORMAL HIGH (ref 3–12)
Monocytes Relative: 16 % — ABNORMAL HIGH (ref 3–12)
Neutro Abs: 3.2 10*3/uL (ref 1.7–7.7)
Neutro Abs: 3.8 10*3/uL (ref 1.7–7.7)
Neutrophils Relative %: 63 % (ref 43–77)
Neutrophils Relative %: 70 % (ref 43–77)
Platelets: 329 10*3/uL (ref 150–400)
RBC: 2.45 MIL/uL — ABNORMAL LOW (ref 4.22–5.81)
RBC: 2.48 MIL/uL — ABNORMAL LOW (ref 4.22–5.81)
RDW: 12.6 % (ref 11.5–15.5)
RDW: 13.4 % (ref 11.5–15.5)
WBC: 4.6 10*3/uL (ref 4.0–10.5)
WBC: 6.1 10*3/uL (ref 4.0–10.5)

## 2013-02-22 LAB — COMPREHENSIVE METABOLIC PANEL
ALT: 7 U/L (ref 0–53)
AST: 9 U/L (ref 0–37)
Albumin: 2.5 g/dL — ABNORMAL LOW (ref 3.5–5.2)
Alkaline Phosphatase: 121 U/L — ABNORMAL HIGH (ref 39–117)
BUN: 9 mg/dL (ref 6–23)
CO2: 24 mEq/L (ref 19–32)
Calcium: 8.5 mg/dL (ref 8.4–10.5)
Chloride: 98 mEq/L (ref 96–112)
Creatinine, Ser: 1.22 mg/dL (ref 0.50–1.35)
GFR calc Af Amer: 70 mL/min — ABNORMAL LOW (ref >90)
GFR calc non Af Amer: 60 mL/min — ABNORMAL LOW (ref >90)
Glucose, Bld: 382 mg/dL — ABNORMAL HIGH (ref 70–99)
Potassium: 3.5 mEq/L (ref 3.5–5.1)
Sodium: 133 mEq/L — ABNORMAL LOW (ref 135–145)
Total Bilirubin: 0.3 mg/dL (ref 0.3–1.2)
Total Protein: 6.2 g/dL (ref 6.0–8.3)

## 2013-02-22 LAB — MICROALBUMIN / CREATININE URINE RATIO: Creatinine, Urine: 512.4 mg/dL

## 2013-02-22 LAB — BASIC METABOLIC PANEL
Chloride: 104 mEq/L (ref 96–112)
GFR calc Af Amer: 71 mL/min — ABNORMAL LOW (ref >90)
GFR calc non Af Amer: 61 mL/min — ABNORMAL LOW (ref >90)
Glucose, Bld: 163 mg/dL — ABNORMAL HIGH (ref 70–99)
Potassium: 3 mEq/L — ABNORMAL LOW (ref 3.5–5.1)
Sodium: 139 mEq/L (ref 135–145)

## 2013-02-22 LAB — URINALYSIS, ROUTINE W REFLEX MICROSCOPIC
Bilirubin Urine: NEGATIVE
Glucose, UA: >1000 mg/dL — AB
Ketones, ur: NEGATIVE mg/dL
Leukocytes, UA: NEGATIVE
Nitrite: NEGATIVE
Protein, ur: >300 mg/dL — AB
Specific Gravity, Urine: >1.046 — ABNORMAL HIGH (ref 1.005–1.030)
Urobilinogen, UA: 0.2 mg/dL (ref 0.0–1.0)
pH: 6 (ref 5.0–8.0)

## 2013-02-22 LAB — GLUCOSE, CAPILLARY
Glucose-Capillary: 140 mg/dL — ABNORMAL HIGH (ref 70–99)
Glucose-Capillary: 204 mg/dL — ABNORMAL HIGH (ref 70–99)
Glucose-Capillary: 322 mg/dL — ABNORMAL HIGH (ref 70–99)

## 2013-02-22 LAB — URINE MICROSCOPIC-ADD ON

## 2013-02-22 LAB — IRON AND TIBC: Iron: <10 ug/dL — ABNORMAL LOW (ref 42–135)

## 2013-02-22 LAB — RETICULOCYTES
RBC.: 2.34 MIL/uL — ABNORMAL LOW (ref 4.22–5.81)
Retic Ct Pct: 2.7 % (ref 0.4–3.1)

## 2013-02-22 LAB — HAPTOGLOBIN: Haptoglobin: 208 mg/dL — ABNORMAL HIGH (ref 45–215)

## 2013-02-22 MED ORDER — SODIUM CHLORIDE 0.9 % IV BOLUS (SEPSIS)
500.0000 mL | Freq: Once | INTRAVENOUS | Status: AC
  Administered 2013-02-22: 500 mL via INTRAVENOUS

## 2013-02-22 MED ORDER — IOHEXOL 350 MG/ML SOLN
80.0000 mL | Freq: Once | INTRAVENOUS | Status: AC | PRN
  Administered 2013-02-22: 80 mL via INTRAVENOUS

## 2013-02-22 MED ORDER — SODIUM CHLORIDE 0.9 % IJ SOLN
3.0000 mL | Freq: Two times a day (BID) | INTRAMUSCULAR | Status: DC
  Administered 2013-02-22 – 2013-02-23 (×3): 3 mL via INTRAVENOUS

## 2013-02-22 MED ORDER — GUAIFENESIN ER 600 MG PO TB12
600.0000 mg | ORAL_TABLET | Freq: Two times a day (BID) | ORAL | Status: DC
Start: 2013-02-22 — End: 2013-02-24
  Administered 2013-02-22 – 2013-02-24 (×4): 600 mg via ORAL
  Filled 2013-02-22 (×5): qty 1

## 2013-02-22 MED ORDER — CARVEDILOL 3.125 MG PO TABS
3.1250 mg | ORAL_TABLET | Freq: Two times a day (BID) | ORAL | Status: DC
  Administered 2013-02-22 – 2013-02-24 (×4): 3.125 mg via ORAL
  Filled 2013-02-22 (×6): qty 1

## 2013-02-22 MED ORDER — PANTOPRAZOLE SODIUM 40 MG PO TBEC
40.0000 mg | DELAYED_RELEASE_TABLET | Freq: Every day | ORAL | Status: DC
  Administered 2013-02-22 – 2013-02-24 (×3): 40 mg via ORAL
  Filled 2013-02-22 (×3): qty 1

## 2013-02-22 MED ORDER — INSULIN ASPART 100 UNIT/ML ~~LOC~~ SOLN
0.0000 [IU] | Freq: Three times a day (TID) | SUBCUTANEOUS | Status: DC
  Administered 2013-02-22: 7 [IU] via SUBCUTANEOUS
  Administered 2013-02-23 – 2013-02-24 (×3): 3 [IU] via SUBCUTANEOUS

## 2013-02-22 MED ORDER — FLUTICASONE PROPIONATE 50 MCG/ACT NA SUSP: 2.0000 | Freq: Every day | NASAL | Status: DC

## 2013-02-22 MED ORDER — GUAIFENESIN ER 600 MG PO TB12: 600.0000 mg | ORAL_TABLET | Freq: Two times a day (BID) | ORAL | Status: DC

## 2013-02-22 MED ORDER — HYDROCODONE-ACETAMINOPHEN 5-325 MG PO TABS
1.0000 | ORAL_TABLET | Freq: Four times a day (QID) | ORAL | Status: DC | PRN
  Administered 2013-02-22: 1 via ORAL
  Filled 2013-02-22: qty 1

## 2013-02-22 MED ORDER — LISINOPRIL 20 MG PO TABS
40.0000 mg | ORAL_TABLET | Freq: Every morning | ORAL | Status: DC
  Administered 2013-02-22 – 2013-02-24 (×3): 40 mg via ORAL
  Filled 2013-02-22 (×3): qty 2

## 2013-02-22 MED ORDER — SALINE SPRAY 0.65 % NA SOLN
1.0000 | NASAL | Status: DC | PRN
  Administered 2013-02-22: 1 via NASAL
  Filled 2013-02-22: qty 44

## 2013-02-22 MED ORDER — FLUTICASONE PROPIONATE 50 MCG/ACT NA SUSP
2.0000 | Freq: Every day | NASAL | Status: DC
  Administered 2013-02-22 – 2013-02-24 (×3): 2 via NASAL
  Filled 2013-02-22: qty 16

## 2013-02-22 MED ORDER — INSULIN ASPART 100 UNIT/ML ~~LOC~~ SOLN
0.0000 [IU] | Freq: Every day | SUBCUTANEOUS | Status: DC
  Administered 2013-02-22: 2 [IU] via SUBCUTANEOUS

## 2013-02-22 MED ORDER — FERROUS SULFATE 325 (65 FE) MG PO TABS
325.0000 mg | ORAL_TABLET | Freq: Three times a day (TID) | ORAL | Status: DC
  Administered 2013-02-22 – 2013-02-24 (×6): 325 mg via ORAL
  Filled 2013-02-22 (×10): qty 1

## 2013-02-22 MED ORDER — INSULIN ASPART 100 UNIT/ML ~~LOC~~ SOLN
4.0000 [IU] | Freq: Three times a day (TID) | SUBCUTANEOUS | Status: DC
  Administered 2013-02-22 – 2013-02-23 (×3): 4 [IU] via SUBCUTANEOUS

## 2013-02-22 MED ORDER — POLYVINYL ALCOHOL 1.4 % OP SOLN
1.0000 [drp] | Freq: Every day | OPHTHALMIC | Status: DC | PRN
  Filled 2013-02-22: qty 15

## 2013-02-22 MED ORDER — INSULIN GLARGINE 100 UNIT/ML ~~LOC~~ SOLN
30.0000 [IU] | Freq: Every day | SUBCUTANEOUS | Status: DC
  Administered 2013-02-22: 30 [IU] via SUBCUTANEOUS
  Filled 2013-02-22 (×2): qty 0.3

## 2013-02-22 MED ORDER — GABAPENTIN 300 MG PO CAPS
300.0000 mg | ORAL_CAPSULE | Freq: Every day | ORAL | Status: DC
  Administered 2013-02-22 – 2013-02-23 (×2): 300 mg via ORAL
  Filled 2013-02-22 (×3): qty 1

## 2013-02-22 MED ORDER — ATORVASTATIN CALCIUM 80 MG PO TABS
80.0000 mg | ORAL_TABLET | Freq: Every day | ORAL | Status: DC
  Administered 2013-02-22 – 2013-02-23 (×2): 80 mg via ORAL
  Filled 2013-02-22 (×3): qty 1

## 2013-02-22 NOTE — ED Notes (Signed)
Pt. Reports he has had a cough since Sat.  And has been feeling dizzy for approx. 1 wk.  No resp. Distress noted in Pt.

## 2013-02-22 NOTE — ED Notes (Addendum)
Pt walked through EMS doors stating that he was SOB. Pt was seen at [facility] last pm and states that he was told his hgb is low. Pt was d/c and was told to see his PCP. PCP told pt to come to ED to be evaluated  Pt adds that he had prostatectomy November 5th and that his PCP told him he stil had blood in urine. Pt is A&O and in NAD

## 2013-02-22 NOTE — ED Provider Notes (Signed)
Medical screening examination/treatment/procedure(s) were conducted as a shared visit with non-physician practitioner(s) and myself.  I personally evaluated the patient during the encounter.  EKG Interpretation    Date/Time:    Ventricular Rate:    PR Interval:    QRS Duration:   QT Interval:    QTC Calculation:   R Axis:     Text Interpretation:              Suspect reactive airway disease after smoking cessation 2 months ago.  Patient be given albuterol.  Given the possibility of infiltrate in his left retrocardiac space a CT scan will be obtained to evaluate further.  Levaquin in the emergency department.  Patient be treated symptomatically and determination as to disposition will be made after hour-long nebulized treatment with albuterol.  Steroids given.  CT scan pending.  Lyanne Co, MD 02/22/13 [phone]

## 2013-02-22 NOTE — Telephone Encounter (Signed)
Pt called needs to be seen before 12-15, transferred call to [facility] triage

## 2013-02-22 NOTE — H&P (Signed)
[facility] History and Physical  [name] ZOX:[id number] DOB: 12/11/1945 DOA: 02/22/2013  Referring physician: Dr. Patria Mane PCP: Lemont Fillers., NP  Specialists: none  Chief Complaint: weakness, fatigue, SOB  HPI: [name] is a 67 y.o. male has a past medical history significant for HTN, HLD, GI Bleed in the past, CKD III, DM2 uncontrolled, with complications, CVA, prostate cancer s/p robotic-assisted laparoscopic radical retropubic prostatectomy with bilateral pelvic lymph node dissection 01/19/2013, presents to the emergency room with a chief complaint of weakness, fatigue and shortness of breath. He states his symptoms have gotten a lot worse last night. He presented to the emergency room  with the symptoms last night, he underwent a CTPA heart concern for PE which was negative, and his hemoglobin was 7.0 at that time and he was advised to followup with PCP. Today he was again advised by his PCP to come back to the emergency room. Repeat CBC shows stable hemoglobin at 7.0. He continues to have symptoms. He denies any fever or chills, denies any abdominal pain, nausea or vomiting. He has no chest pain. He denies any blood in his stool or dark tarry stools. FOBT in the emergency room was negative for bleed. Follow his prostate resection, he has been incontinent, however denies local worsening pain or any problems postoperatively.  Review of Systems: As per history of present illness, otherwise negative  Past Medical History  Diagnosis Date  . Hypertension   . High cholesterol   . GERD (gastroesophageal reflux disease)   . GI bleed     a. Recurrent GI bleed, tx with IV iron. b. Per heme notes - likely AVMs 04/2012 (tx with cauterization several months ago).  . Orthostatic hypotension     a. Tx with florinef.  . Hematuria     a. Urology note scan from 07/2012: cystoscopy without evidence for bladder lesion, only lateral hypertrophy of posterior urethra, bladder impression from  BPH. b. Pt states he had "some tests" scheduled for later in July 2014.  . Stage III chronic kidney disease     a. Stage 3 (DM with complications ->CKD, peripheral neuropathy).  . Anemia, iron deficiency 03/24/2011    a. Recurrent GI bleed, tx with periodic iron infusions.  . 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 Sleep apnea     "had mask; couldn't sleep in it" (09/20/2012)  . Type II diabetes mellitus     a. Dx 1994, uncontrolled.   . Diabetic peripheral neuropathy 2014    foot pain.  . Chronic lower back pain   . Fatty liver     on CT of 11/2010  . AVM (arteriovenous malformation) of duodenum, acquired     egds in 01/2012, 04/2011  . Polyp, colonic     Colonoscopy 01/2012 "benign" polyp  . CAD (coronary artery disease)     a. Cath 09/2012: moderate borderline CAD in mid LAD/small diagonal branch, mild RCA stenosis, to be managed medically   . BPH (benign prostatic hyperplasia)   . Prostate cancer   . Stroke   . Peripheral neuropathy    Past Surgical History  Procedure Laterality Date  . Shoulder open rotator cuff repair Left 1980's  . Inguinal hernia repair Right 2011  . Lumbar disc surgery  1980's  . Robot assisted laparoscopic radical prostatectomy N/A 01/19/2013    Procedure: ROBOTIC ASSISTED LAPAROSCOPIC RADICAL PROSTATECTOMY;  Surgeon: Valetta Fuller, MD;  Location: [facility];  Service: Urology;  Laterality: N/A;  . Lymphadenectomy Bilateral 01/19/2013  Procedure: LYMPHADENECTOMY;  Surgeon: Valetta Fuller, MD;  Location: [facility];  Service: Urology;  Laterality: Bilateral;  . Prostatectomy     Social History:  reports that he quit smoking about 11 months ago. His smoking use included Cigarettes. He has a 48 pack-year smoking history. He has never used smokeless tobacco. He reports that he does not drink alcohol or use illicit drugs.  No Known Allergies  Family History  Problem Relation Age of Onset  . Heart attack Brother     Died at 81  . Stroke Father     Died at 73  .  Diabetes Sister   . Diabetes Mother   . Stomach cancer Brother   . Heart attack Sister    Prior to Admission medications   Medication Sig Start Date End Date Taking? Authorizing Provider  aspirin 81 MG chewable tablet Chew by mouth daily.   Yes Historical Provider, MD  atorvastatin (LIPITOR) 80 MG tablet TAKE 1 TABLET EVERY DAY 02/01/13  Yes Sandford Craze, NP  carvedilol (COREG) 3.125 MG tablet Take 3.125 mg by mouth 2 (two) times daily with a meal.   Yes Historical Provider, MD  fluticasone (FLONASE) 50 MCG/ACT nasal spray Place 2 sprays into the nose daily.   Yes Historical Provider, MD  gabapentin (NEURONTIN) 300 MG capsule Take 300 mg by mouth at bedtime.   Yes Historical Provider, MD  guaiFENesin (MUCINEX) 600 MG 12 hr tablet Take 1 tablet (600 mg total) by mouth 2 (two) times daily. 02/22/13  Yes April K Palumbo-Rasch, MD  HYDROcodone-acetaminophen [facility]) 5-325 MG per tablet Take 1-2 tablets by mouth every 6 (six) hours as needed. 01/19/13  Yes Darol Destine. Myer Haff, PA-C  insulin glargine (LANTUS) 100 UNIT/ML injection Inject 45 Units into the skin at bedtime.    Yes Historical Provider, MD  insulin lispro (HUMALOG) 100 UNIT/ML injection Inject 5 Units into the skin 3 (three) times daily before meals.   Yes Historical Provider, MD  lisinopril (PRINIVIL,ZESTRIL) 40 MG tablet Take 40 mg by mouth every morning.   Yes Historical Provider, MD  pantoprazole (PROTONIX) 40 MG tablet Take 1 tablet (40 mg total) by mouth daily. 01/17/13  Yes Sandford Craze, NP  polyvinyl alcohol (LIQUIFILM TEARS) 1.4 % ophthalmic solution Place 1 drop into both eyes daily as needed (dry eyes).   Yes Historical Provider, MD  nitroGLYCERIN (NITROSTAT) 0.4 MG SL tablet Place 1 tablet (0.4 mg total) under the tongue every 5 (five) minutes as needed for chest pain (up to 3 doses). 09/22/12   Laurann Montana, PA-C   Physical Exam: Filed Vitals:   02/22/13 1158 02/22/13 1216 02/22/13 1453  BP: 144/76 137/79 140/80   Pulse: 126 113 112  Temp: 97.6 F (36.4 C)    TempSrc: Oral    Resp: 20 21 18   Height: 5\' 7"  (1.702 m)    Weight: 72.122 kg (159 lb)    SpO2: 100% 100% 99%     General:  No apparent distress  Eyes: PERRL, EOMI, no scleral icterus  ENT: moist oropharynx  Neck: supple, no JVD  Cardiovascular: regular rate without MRG; 2+ peripheral pulses  Respiratory: CTA biL, good air movement without wheezing, rhonchi or crackled  Abdomen: soft, non tender to palpation, positive bowel sounds, no guarding, no rebound  Skin: no rashes  Musculoskeletal: no peripheral edema  Psychiatric: normal mood and affect  Neurologic: Grossly nonfocal  Labs on Admission:  Basic Metabolic Panel:  Recent Labs Lab 02/21/13 0909 02/22/13 0033 02/22/13  1245  NA 137 139 133*  K 3.5 3.0* 3.5  CL 101 104 98  CO2 27 24 24   GLUCOSE 343* 163* 382*  BUN 8 12 9   CREATININE 1.10 1.20 1.22  CALCIUM 8.5 8.7 8.5   Liver Function Tests:  Recent Labs Lab 02/21/13 0909 02/22/13 1245  AST 9 9  ALT 9 7  ALKPHOS 116 121*  BILITOT 0.3 0.3  PROT 5.8* 6.2  ALBUMIN 3.0* 2.5*   CBC:  Recent Labs Lab 02/22/13 0033 02/22/13 1245  WBC 6.1 4.6  NEUTROABS 3.8 3.2  HGB 7.0* 7.0*  HCT 20.9* 20.6*  MCV 85.3 83.1  PLT 340 329   Cardiac Enzymes:  Recent Labs Lab 02/22/13 0033  TROPONINI <0.30   CBG:  Recent Labs Lab 02/22/13 0031  GLUCAP 140*    Radiological Exams on Admission: Dg Chest 2 View  02/22/2013   CLINICAL DATA:  Dizziness, cough.  History of prostate cancer.  EXAM: CHEST  2 VIEW  COMPARISON:  Chest radiograph December 03, 2012  FINDINGS: Cardiomediastinal silhouette is unremarkable. The lungs are clear without pleural effusions or focal consolidations. Pulmonary vasculature is unremarkable. Trachea projects midline and there is no pneumothorax. Soft tissue planes and included osseous structures are nonsuspicious. Mild degenerative change of the thoracic spine.  IMPRESSION: No  active cardiopulmonary disease.   Electronically Signed   By: Awilda Metro   On: 02/22/2013 01:20   Ct Angio Chest Pe W/cm &/or Wo Cm  02/22/2013   CLINICAL DATA:  Cough, lightheaded headedness, recent surgery for prostate cancer.  EXAM: CT ANGIOGRAPHY CHEST WITH CONTRAST  TECHNIQUE: Multidetector CT imaging of the chest was performed using the standard protocol during bolus administration of intravenous contrast. Multiplanar CT image reconstructions including MIPs were obtained to evaluate the vascular anatomy.  CONTRAST:  80mL OMNIPAQUE IOHEXOL 350 MG/ML SOLN  COMPARISON:  Chest radiograph February 22, 2013 and CT of the chest November 03, 2012.  FINDINGS: Mildly delayed bolus timing. Main pulmonary artery is not enlarged. No pulmonary arterial filling defects to the level of the subsegmental arteries. Mild contrast admixture in the lower lobe segment segmental arteries.  Minimal centrilobular emphysema. No solid pulmonary nodules or masses. No focal consolidations or pleural effusions. 3 mm right lower lobe, posterior segment ground-glass nodule is below the level for surveillance recommendations . Mild bronchial wall thickening, most notable in left lower lobe. Tracheobronchial tree is patent and midline, no pneumothorax.  Thoracic aorta is normal in course and caliber, with mild calcific atherosclerosis. The heart and pericardium are unremarkable. No lymphadenopathy by CT size criteria. Thoracic esophagus is nonsuspicious. Included view of the abdomen is unremarkable. Very mild gynecomastia. Mild degenerative change of the included right shoulder. Osseous structures are nonsuspicious.  Review of the MIP images confirms the above findings.  IMPRESSION: No pulmonary arterial embolism.  Minimal bronchial wall thickening may reflect bronchitis, most notable in the left lung base. 3 mm right lower lobe sub solid pulmonary nodule could be reactive.   Electronically Signed   By: Awilda Metro   On: 02/22/2013  01:32    EKG: Independently reviewed.  Assessment/Plan Principal Problem:   Anemia Active Problems:   Anemia, iron deficiency   Diabetes mellitus type 2, uncontrolled   Unspecified essential hypertension   Hyperlipidemia   Hematuria   Diabetic neuropathy   CAD (coronary artery disease)   Prostate cancer   CVA (cerebral infarction)   Symptomatic anemia - the patient's hemoglobin when he left the hospital 4 weeks  ago was 9.2 and appeared stable during his hospital stay. He doesn't have any gross hematuria. We'll repeat iron studies, however most recent blood work in August looked fairly good. Start oral iron supplementation. Transfuse 2 units and repeat CBC. Patient has never had a transfusion in the past. Check hemolysis labs. Prostate cancer status post resection - I discussed the case with Dr. Isabel Caprice over the phone regarding possible postoperative complications, and this seems unlikely at this point.  History of GI bleed - FOBT was negative in the emergency room, however we'll repeat that. If positive, consult GI. History of CVA - stable at this point Type 2 diabetes - continue his home long acting and bolus insulin, at lower doses. Add sliding scale. Neuropathy - continue gabapentin Hyperlipidemia - continue statin Hypertension - continue coreg and lisinopril  Diet: Carb modified  Fluids: None  DVT Prophylaxis: SCDs  Code Status: Full code  Family Communication: None  Disposition Plan: Inpatient, home when ready  Time spent: 21  Esta Carmon M. Elvera Lennox, MD [facility] Pager [phone]  If 7PM-7AM, please contact night-coverage [email or web address] Password [email or web address] 02/22/2013, 2:59 PM

## 2013-02-22 NOTE — ED Provider Notes (Signed)
CSN: [id number]     Arrival date & time 02/21/13  2357 History   First MD Initiated Contact with Patient 02/22/13 0010     Chief Complaint  Patient presents with  . Dizziness   (Consider location/radiation/quality/duration/timing/severity/associated sxs/prior Treatment) Patient is a 67 y.o. male presenting with cough. The history is provided by the patient.  Cough Cough characteristics:  Non-productive Severity:  Mild Onset quality:  Gradual Timing:  Sporadic Progression:  Unchanged Chronicity:  New Context: not animal exposure   Relieved by:  Nothing Worsened by:  Nothing tried Ineffective treatments:  None tried Associated symptoms: ear pain   Associated symptoms: no chest pain, no diaphoresis, no fever, no headaches, no rash, no shortness of breath, no weight loss and no wheezing   Ear pain:    Location:  Bilateral   Severity:  Moderate   Onset quality:  Gradual   Duration:  1 week   Timing:  Constant   Progression:  Unchanged   Chronicity:  New Risk factors: no recent travel   Also lightheadedness for more than a week with reported hoarseness for more than a month.  No weakness nor numbness no changes in vision or speech.  Seen by PMD, states he reported same and was referred to ENT, Dr. Marisa Sprinkles for evaluation of symptoms  Past Medical History  Diagnosis Date  . Hypertension   . High cholesterol   . GERD (gastroesophageal reflux disease)   . GI bleed     a. Recurrent GI bleed, tx with IV iron. b. Per heme notes - likely AVMs 04/2012 (tx with cauterization several months ago).  . Orthostatic hypotension     a. Tx with florinef.  . Hematuria     a. Urology note scan from 07/2012: cystoscopy without evidence for bladder lesion, only lateral hypertrophy of posterior urethra, bladder impression from BPH. b. Pt states he had "some tests" scheduled for later in July 2014.  . Stage III chronic kidney disease     a. Stage 3 (DM with complications ->CKD, peripheral neuropathy).   . Anemia, iron deficiency 03/24/2011    a. Recurrent GI bleed, tx with periodic iron infusions.  . 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 Sleep apnea     "had mask; couldn't sleep in it" (09/20/2012)  . Type II diabetes mellitus     a. Dx 1994, uncontrolled.   . Diabetic peripheral neuropathy 2014    foot pain.  . Chronic lower back pain   . Fatty liver     on CT of 11/2010  . AVM (arteriovenous malformation) of duodenum, acquired     egds in 01/2012, 04/2011  . Polyp, colonic     Colonoscopy 01/2012 "benign" polyp  . CAD (coronary artery disease)     a. Cath 09/2012: moderate borderline CAD in mid LAD/small diagonal branch, mild RCA stenosis, to be managed medically   . BPH (benign prostatic hyperplasia)   . Prostate cancer   . Stroke   . Peripheral neuropathy    Past Surgical History  Procedure Laterality Date  . Shoulder open rotator cuff repair Left 1980's  . Inguinal hernia repair Right 2011  . Lumbar disc surgery  1980's  . Robot assisted laparoscopic radical prostatectomy N/A 01/19/2013    Procedure: ROBOTIC ASSISTED LAPAROSCOPIC RADICAL PROSTATECTOMY;  Surgeon: Valetta Fuller, MD;  Location: [facility];  Service: Urology;  Laterality: N/A;  . Lymphadenectomy Bilateral 01/19/2013    Procedure: LYMPHADENECTOMY;  Surgeon: Valetta Fuller, MD;  Location: [facility];  Service: Urology;  Laterality: Bilateral;  . Prostatectomy     Family History  Problem Relation Age of Onset  . Heart attack Brother     Died at 89  . Stroke Father     Died at 43  . Diabetes Sister   . Diabetes Mother   . Stomach cancer Brother   . Heart attack Sister    History  Substance Use Topics  . Smoking status: Former Smoker -- 1.00 packs/day for 48 years    Types: Cigarettes    Quit date: 03/17/2012  . Smokeless tobacco: Never Used  . Alcohol Use: No     Comment: 09/20/2012 "Used to; stopped ~ 2009; never had problem w/it"    Review of Systems  Constitutional: Negative for fever, weight loss and  diaphoresis.  HENT: Positive for ear pain and voice change. Negative for drooling, ear discharge and trouble swallowing.   Respiratory: Positive for cough. Negative for shortness of breath and wheezing.   Cardiovascular: Negative for chest pain, palpitations and leg swelling.  Gastrointestinal: Negative for abdominal pain, blood in stool and anal bleeding.  Skin: Negative for rash.  Neurological: Positive for light-headedness. Negative for seizures, facial asymmetry, speech difficulty, weakness and headaches.  All other systems reviewed and are negative.    Allergies  Review of patient's allergies indicates no known allergies.  Home Medications   Current Outpatient Rx  Name  Route  Sig  Dispense  Refill  . aspirin 81 MG chewable tablet   Oral   Chew by mouth daily.         [name] Kitchen atorvastatin (LIPITOR) 80 MG tablet      TAKE 1 TABLET EVERY DAY   30 tablet   2   . carvedilol (COREG) 3.125 MG tablet   Oral   Take 3.125 mg by mouth 2 (two) times daily with a meal.         . ciprofloxacin (CIPRO) 500 MG tablet   Oral   Take 1 tablet (500 mg total) by mouth 2 (two) times daily. Start day prior to office visit for foley removal   6 tablet   0   . esomeprazole (NEXIUM) 40 MG capsule   Oral   Take 40 mg by mouth daily at 12 noon.         . fluticasone (FLONASE) 50 MCG/ACT nasal spray   Nasal   Place 2 sprays into the nose daily.         [name] Kitchen gabapentin (NEURONTIN) 300 MG capsule   Oral   Take 300 mg by mouth at bedtime.         [name] Kitchen HYDROcodone-acetaminophen (NORCO) 5-325 MG per tablet   Oral   Take 1-2 tablets by mouth every 6 (six) hours as needed.   30 tablet   0   . insulin glargine (LANTUS) 100 UNIT/ML injection   Subcutaneous   Inject 45 Units into the skin at bedtime.          . insulin lispro (HUMALOG) 100 UNIT/ML injection   Subcutaneous   Inject 5 Units into the skin 3 (three) times daily before meals.         [name] Kitchen lisinopril (PRINIVIL,ZESTRIL) 40 MG  tablet   Oral   Take 40 mg by mouth every morning.         . nitroGLYCERIN (NITROSTAT) 0.4 MG SL tablet   Sublingual   Place 1 tablet (0.4 mg total) under the tongue every 5 (five) minutes as needed  for chest pain (up to 3 doses).   25 tablet   2     IMPORTANT: Do not take this medication if you have ...   . pantoprazole (PROTONIX) 40 MG tablet   Oral   Take 1 tablet (40 mg total) by mouth daily.   30 tablet   3   . polyvinyl alcohol (LIQUIFILM TEARS) 1.4 % ophthalmic solution   Both Eyes   Place 1 drop into both eyes daily as needed (dry eyes).          BP 148/78  Pulse 115  Temp(Src) 97.6 F (36.4 C) (Oral)  Resp 18  Ht 5\' 7"  (1.702 m)  Wt 159 lb (72.122 kg)  BMI 24.90 kg/m2  SpO2 99% Physical Exam  Constitutional: He is oriented to person, place, and time. He appears well-developed and well-nourished. No distress.  HENT:  Head: Normocephalic and atraumatic.  Right Ear: No mastoid tenderness. Tympanic membrane is not injected, not perforated and not erythematous. No hemotympanum.  Left Ear: No mastoid tenderness. Tympanic membrane is not injected, not perforated and not erythematous. No hemotympanum.  Mouth/Throat: No oropharyngeal exudate.  Cobblestoning consistent with post nasal drip  Eyes: Conjunctivae and EOM are normal. Pupils are equal, round, and reactive to light.  Neck: Normal range of motion. Neck supple.  Cardiovascular: Normal rate, regular rhythm and intact distal pulses.   Pulmonary/Chest: Effort normal and breath sounds normal. No stridor. He has no wheezes. He has no rales.  Abdominal: Soft. Bowel sounds are normal. There is no tenderness. There is no rebound and no guarding.  Musculoskeletal: Normal range of motion. He exhibits no edema.  Lymphadenopathy:    He has no cervical adenopathy.  Neurological: He is alert and oriented to person, place, and time. He has normal reflexes. No cranial nerve deficit.  Skin: Skin is warm and dry.   Psychiatric: He has a normal mood and affect.    ED Course  Procedures (including critical care time) Labs Review Labs Reviewed  CBC WITH DIFFERENTIAL - Abnormal; Notable for the following:    RBC 2.45 (*)    Hemoglobin 7.0 (*)    HCT 20.9 (*)    Monocytes Relative 16 (*)    All other components within normal limits  GLUCOSE, CAPILLARY - Abnormal; Notable for the following:    Glucose-Capillary 140 (*)    All other components within normal limits  TROPONIN I  BASIC METABOLIC PANEL   Imaging Review No results found.  EKG Interpretation   None       MDM  No diagnosis found.  Date: 02/22/2013  Rate:100  Rhythm: normal sinus rhythm  QRS Axis: normal  Intervals: normal  ST/T Wave abnormalities: non specific t changes  Conduction Disutrbances: none  Narrative Interpretation: unchanged   In the setting of lightheadedness of at least a week's duration with negative EKG and troponin, ACS is excluded.  Suspect lightheadedness is due to anemia.  Patient is not taking his iron supplement.  Has appointment with ENT next week for evaluation of hoarseness, he is advised to keep this appointment.  Patient has intact phonation and is not hoarse on my exam.  Has post nasal drip, suspect this is source of cough.  Will start flonase.  Will restart patients Iron.  Will need levels rechecked.  Follow up with your PMD.     Courtney Fenlon K Paullette Mckain-Rasch, MD 02/22/13 [phone]

## 2013-02-22 NOTE — ED Provider Notes (Signed)
CSN: [id number]     Arrival date & time 02/22/13  1154 History   First MD Initiated Contact with Patient 02/22/13 1205     Chief Complaint  Patient presents with  . Shortness of Breath   (Consider location/radiation/quality/duration/timing/severity/associated sxs/prior Treatment) HPI Patient is a 67 yo male with a PMH of anemia, DM, HTN, CAD, CVA, and prostate cancer s/p prostatectomy on 11/5 presents to the Emergency Department complaining of dizziness and SOB with activity. Patient states that he was seen in the ED last night for a cough x1 week and dizziness. He reports that he was told to follow-up with his PCP regarding his "low iron." Patient states he called his PCP today who told him that he should return to the ED due to concern for bleeding causing his low hemoglobin. [name] Kitchen He denies any LOC. He denies chest pain, abdominal pain, hemoptysis, hematuria, hematochezia, and melena. He denies fever, nausea, and vomiting. Patient reports that he is constipated. He reports that he has never taken oral iron supplementation, but he has received IV iron in the past.   Past Medical History  Diagnosis Date  . Hypertension   . High cholesterol   . GERD (gastroesophageal reflux disease)   . GI bleed     a. Recurrent GI bleed, tx with IV iron. b. Per heme notes - likely AVMs 04/2012 (tx with cauterization several months ago).  . Orthostatic hypotension     a. Tx with florinef.  . Hematuria     a. Urology note scan from 07/2012: cystoscopy without evidence for bladder lesion, only lateral hypertrophy of posterior urethra, bladder impression from BPH. b. Pt states he had "some tests" scheduled for later in July 2014.  . Stage III chronic kidney disease     a. Stage 3 (DM with complications ->CKD, peripheral neuropathy).  . Anemia, iron deficiency 03/24/2011    a. Recurrent GI bleed, tx with periodic iron infusions.  . 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 Sleep apnea     "had mask; couldn't sleep in it"  (09/20/2012)  . Type II diabetes mellitus     a. Dx 1994, uncontrolled.   . Diabetic peripheral neuropathy 2014    foot pain.  . Chronic lower back pain   . Fatty liver     on CT of 11/2010  . AVM (arteriovenous malformation) of duodenum, acquired     egds in 01/2012, 04/2011  . Polyp, colonic     Colonoscopy 01/2012 "benign" polyp  . CAD (coronary artery disease)     a. Cath 09/2012: moderate borderline CAD in mid LAD/small diagonal branch, mild RCA stenosis, to be managed medically   . BPH (benign prostatic hyperplasia)   . Prostate cancer   . Stroke   . Peripheral neuropathy    Past Surgical History  Procedure Laterality Date  . Shoulder open rotator cuff repair Left 1980's  . Inguinal hernia repair Right 2011  . Lumbar disc surgery  1980's  . Robot assisted laparoscopic radical prostatectomy N/A 01/19/2013    Procedure: ROBOTIC ASSISTED LAPAROSCOPIC RADICAL PROSTATECTOMY;  Surgeon: Valetta Fuller, MD;  Location: [facility];  Service: Urology;  Laterality: N/A;  . Lymphadenectomy Bilateral 01/19/2013    Procedure: LYMPHADENECTOMY;  Surgeon: Valetta Fuller, MD;  Location: [facility];  Service: Urology;  Laterality: Bilateral;  . Prostatectomy     Family History  Problem Relation Age of Onset  . Heart attack Brother     Died at 52  . Stroke  Father     Died at 35  . Diabetes Sister   . Diabetes Mother   . Stomach cancer Brother   . Heart attack Sister    History  Substance Use Topics  . Smoking status: Former Smoker -- 1.00 packs/day for 48 years    Types: Cigarettes    Quit date: 03/17/2012  . Smokeless tobacco: Never Used  . Alcohol Use: No     Comment: 09/20/2012 "Used to; stopped ~ 2009; never had problem w/it"    Review of Systems All other systems negative except as documented in the HPI. All pertinent positives and negatives as reviewed in the HPI.  Allergies  Review of patient's allergies indicates no known allergies.  Home Medications   Current Outpatient Rx  Name   Route  Sig  Dispense  Refill  . aspirin 81 MG chewable tablet   Oral   Chew by mouth daily.         [name] Kitchen atorvastatin (LIPITOR) 80 MG tablet      TAKE 1 TABLET EVERY DAY   30 tablet   2   . carvedilol (COREG) 3.125 MG tablet   Oral   Take 3.125 mg by mouth 2 (two) times daily with a meal.         . fluticasone (FLONASE) 50 MCG/ACT nasal spray   Nasal   Place 2 sprays into the nose daily.         [name] Kitchen gabapentin (NEURONTIN) 300 MG capsule   Oral   Take 300 mg by mouth at bedtime.         [name] Kitchen guaiFENesin (MUCINEX) 600 MG 12 hr tablet   Oral   Take 1 tablet (600 mg total) by mouth 2 (two) times daily.   14 tablet   0   . HYDROcodone-acetaminophen (NORCO) 5-325 MG per tablet   Oral   Take 1-2 tablets by mouth every 6 (six) hours as needed.   30 tablet   0   . insulin glargine (LANTUS) 100 UNIT/ML injection   Subcutaneous   Inject 45 Units into the skin at bedtime.          . insulin lispro (HUMALOG) 100 UNIT/ML injection   Subcutaneous   Inject 5 Units into the skin 3 (three) times daily before meals.         [name] Kitchen lisinopril (PRINIVIL,ZESTRIL) 40 MG tablet   Oral   Take 40 mg by mouth every morning.         . pantoprazole (PROTONIX) 40 MG tablet   Oral   Take 1 tablet (40 mg total) by mouth daily.   30 tablet   3   . polyvinyl alcohol (LIQUIFILM TEARS) 1.4 % ophthalmic solution   Both Eyes   Place 1 drop into both eyes daily as needed (dry eyes).         . nitroGLYCERIN (NITROSTAT) 0.4 MG SL tablet   Sublingual   Place 1 tablet (0.4 mg total) under the tongue every 5 (five) minutes as needed for chest pain (up to 3 doses).   25 tablet   2     IMPORTANT: Do not take this medication if you have ...    BP 137/79  Pulse 113  Temp(Src) 97.6 F (36.4 C) (Oral)  Resp 21  Ht 5\' 7"  (1.702 m)  Wt 159 lb (72.122 kg)  BMI 24.90 kg/m2  SpO2 100% Physical Exam  Constitutional: He is oriented to person, place, and time. He appears well-developed and  well-nourished. No distress.  HENT:  Head: Normocephalic and atraumatic.  Eyes: Pupils are equal, round, and reactive to light.  Neck: Normal range of motion. Neck supple.  Cardiovascular: Regular rhythm, normal heart sounds and intact distal pulses.  Tachycardia present.  Exam reveals no gallop and no friction rub.   No murmur heard. Pulmonary/Chest: Effort normal and breath sounds normal. No respiratory distress. He has no wheezes. He has no rales.  Abdominal: Soft. Bowel sounds are normal. He exhibits no distension. There is no tenderness. There is no rebound.  Genitourinary: Rectum normal.  No stool in rectum, but stool palpable at tip of finger.   Musculoskeletal: Normal range of motion. He exhibits no edema.  Neurological: He is alert and oriented to person, place, and time.  Skin: Skin is warm and dry.    ED Course  Procedures (including critical care time) Labs Review Labs Reviewed  CBC WITH DIFFERENTIAL - Abnormal; Notable for the following:    RBC 2.48 (*)    Hemoglobin 7.0 (*)    HCT 20.6 (*)    Lymphs Abs 0.6 (*)    Monocytes Relative 13 (*)    All other components within normal limits  COMPREHENSIVE METABOLIC PANEL - Abnormal; Notable for the following:    Sodium 133 (*)    Glucose, Bld 382 (*)    Albumin 2.5 (*)    Alkaline Phosphatase 121 (*)    GFR calc non Af Amer 60 (*)    GFR calc Af Amer 70 (*)    All other components within normal limits  URINALYSIS, ROUTINE W REFLEX MICROSCOPIC   Imaging Review Dg Chest 2 View  02/22/2013   CLINICAL DATA:  Dizziness, cough.  History of prostate cancer.  EXAM: CHEST  2 VIEW  COMPARISON:  Chest radiograph December 03, 2012  FINDINGS: Cardiomediastinal silhouette is unremarkable. The lungs are clear without pleural effusions or focal consolidations. Pulmonary vasculature is unremarkable. Trachea projects midline and there is no pneumothorax. Soft tissue planes and included osseous structures are nonsuspicious. Mild  degenerative change of the thoracic spine.  IMPRESSION: No active cardiopulmonary disease.   Electronically Signed   By: Awilda Metro   On: 02/22/2013 01:20   Ct Angio Chest Pe W/cm &/or Wo Cm  02/22/2013   CLINICAL DATA:  Cough, lightheaded headedness, recent surgery for prostate cancer.  EXAM: CT ANGIOGRAPHY CHEST WITH CONTRAST  TECHNIQUE: Multidetector CT imaging of the chest was performed using the standard protocol during bolus administration of intravenous contrast. Multiplanar CT image reconstructions including MIPs were obtained to evaluate the vascular anatomy.  CONTRAST:  80mL OMNIPAQUE IOHEXOL 350 MG/ML SOLN  COMPARISON:  Chest radiograph February 22, 2013 and CT of the chest November 03, 2012.  FINDINGS: Mildly delayed bolus timing. Main pulmonary artery is not enlarged. No pulmonary arterial filling defects to the level of the subsegmental arteries. Mild contrast admixture in the lower lobe segment segmental arteries.  Minimal centrilobular emphysema. No solid pulmonary nodules or masses. No focal consolidations or pleural effusions. 3 mm right lower lobe, posterior segment ground-glass nodule is below the level for surveillance recommendations . Mild bronchial wall thickening, most notable in left lower lobe. Tracheobronchial tree is patent and midline, no pneumothorax.  Thoracic aorta is normal in course and caliber, with mild calcific atherosclerosis. The heart and pericardium are unremarkable. No lymphadenopathy by CT size criteria. Thoracic esophagus is nonsuspicious. Included view of the abdomen is unremarkable. Very mild gynecomastia. Mild degenerative change of the included right shoulder. Osseous  structures are nonsuspicious.  Review of the MIP images confirms the above findings.  IMPRESSION: No pulmonary arterial embolism.  Minimal bronchial wall thickening may reflect bronchitis, most notable in the left lung base. 3 mm right lower lobe sub solid pulmonary nodule could be reactive.    Electronically Signed   By: Awilda Metro   On: 02/22/2013 01:32   Patient be admitted to the hospital for symptomatic anemia.  Patient is explained the plan and I spoke with the, [facility] will be down admit the patient    Carlyle Dolly, PA-C 02/22/13 1537

## 2013-02-22 NOTE — ED Notes (Signed)
Pt. Reports he saw his primary Dr. Today and was told to see the ENT for the sore throat.  Pt. Said Dr. Did not address the dizziness.

## 2013-02-22 NOTE — Progress Notes (Signed)
Pt called and stated he felt extremely weak and tired. After reviewing ER notes pt's hgb was 10.6 on 01/19/13 and is now 7.0. Per Dr. Myna Hidalgo it is insisted that he returns to ER for further work up given his history of recent Prostate surgery and significant history of GI bleeds. Pt is symptomatic and further testing made be needed to identify his source of bleeding and decrease over the past month of his hgb. Pt verbalized understanding and will keep [place] updated with his progress.

## 2013-02-23 ENCOUNTER — Telehealth: Payer: Self-pay | Admitting: *Deleted

## 2013-02-23 ENCOUNTER — Telehealth: Payer: Self-pay | Admitting: Family

## 2013-02-23 DIAGNOSIS — D631 Anemia in chronic kidney disease: Secondary | ICD-10-CM

## 2013-02-23 DIAGNOSIS — D509 Iron deficiency anemia, unspecified: Secondary | ICD-10-CM

## 2013-02-23 LAB — FERRITIN: Ferritin: 15 ng/mL — ABNORMAL LOW (ref 22–322)

## 2013-02-23 LAB — BASIC METABOLIC PANEL
Chloride: 106 mEq/L (ref 96–112)
GFR calc Af Amer: 76 mL/min — ABNORMAL LOW (ref >90)
GFR calc non Af Amer: 65 mL/min — ABNORMAL LOW (ref >90)
Potassium: 2.8 mEq/L — ABNORMAL LOW (ref 3.5–5.1)

## 2013-02-23 LAB — GLUCOSE, CAPILLARY
Glucose-Capillary: 75 mg/dL (ref 70–99)
Glucose-Capillary: 119 mg/dL — ABNORMAL HIGH (ref 70–99)
Glucose-Capillary: 90 mg/dL (ref 70–99)

## 2013-02-23 LAB — CBC
MCHC: 34.7 g/dL (ref 30.0–36.0)
Platelets: 286 10*3/uL (ref 150–400)
RDW: 13.6 % (ref 11.5–15.5)
WBC: 5.6 10*3/uL (ref 4.0–10.5)

## 2013-02-23 LAB — MAGNESIUM: Magnesium: 1.9 mg/dL (ref 1.5–2.5)

## 2013-02-23 LAB — VITAMIN B12: Vitamin B-12: 483 pg/mL (ref 211–911)

## 2013-02-23 MED ORDER — VITAMINS A & D EX OINT
TOPICAL_OINTMENT | CUTANEOUS | Status: AC
  Administered 2013-02-23: 13:00:00
  Filled 2013-02-23: qty 5

## 2013-02-23 MED ORDER — GLUCERNA SHAKE PO LIQD
237.0000 mL | ORAL | Status: DC
  Administered 2013-02-24: 237 mL via ORAL
  Filled 2013-02-23: qty 237

## 2013-02-23 MED ORDER — GUAIFENESIN 100 MG/5ML PO SOLN
100.0000 mg | ORAL | Status: DC | PRN
  Administered 2013-02-23: 19:00:00 100 mg via ORAL
  Filled 2013-02-23: qty 10

## 2013-02-23 MED ORDER — HYDRALAZINE HCL 10 MG PO TABS
10.0000 mg | ORAL_TABLET | Freq: Three times a day (TID) | ORAL | Status: DC
  Administered 2013-02-23 – 2013-02-24 (×3): 10 mg via ORAL
  Filled 2013-02-23 (×6): qty 1

## 2013-02-23 MED ORDER — INSULIN GLARGINE 100 UNIT/ML ~~LOC~~ SOLN
10.0000 [IU] | Freq: Every day | SUBCUTANEOUS | Status: DC
  Administered 2013-02-23: 10 [IU] via SUBCUTANEOUS
  Filled 2013-02-23 (×2): qty 0.1

## 2013-02-23 MED ORDER — POTASSIUM CHLORIDE CRYS ER 20 MEQ PO TBCR
40.0000 meq | EXTENDED_RELEASE_TABLET | Freq: Two times a day (BID) | ORAL | Status: AC
  Administered 2013-02-23 (×2): 40 meq via ORAL
  Filled 2013-02-23 (×2): qty 2

## 2013-02-23 MED ORDER — ADULT MULTIVITAMIN W/MINERALS CH
1.0000 | ORAL_TABLET | Freq: Every day | ORAL | Status: DC
  Administered 2013-02-23 – 2013-02-24 (×2): 1 via ORAL
  Filled 2013-02-23 (×2): qty 1

## 2013-02-23 NOTE — Telephone Encounter (Signed)
Please call pt and arrange follow up visit on Friday from ED.

## 2013-02-23 NOTE — Telephone Encounter (Signed)
Received a call from Doug Sou, [place]. Patient is inpatient. Need to get records from [facility]- EGD, Colonoscopies faxed to [phone]. [place] [phone]. Spoke with medical records at [facility] and they will fax the records.

## 2013-02-23 NOTE — Progress Notes (Signed)
INITIAL NUTRITION ASSESSMENT  DOCUMENTATION CODES Per approved criteria  -Non-severe (moderate) malnutrition in the context of acute illness or injury   INTERVENTION: Provide snack once daily Provide Glucerna Shake once daily Provide Multivitamins with minerals daily  NUTRITION DIAGNOSIS: Unintentional weight loss related to acute illness as evidenced by 6% weight loss in the past month.   Goal: Pt to meet >/= 90% of their estimated nutrition needs   Monitor:  PO intake Weight Labs  Reason for Assessment: Malnutrition Screening Tool, score of 2  67 y.o. male  Admitting Dx: Anemia  ASSESSMENT: 67 y.o. male has a past medical history significant for HTN, HLD, GI Bleed in the past, CKD III, DM2 uncontrolled, with complications, CVA, prostate cancer s/p robotic-assisted laparoscopic radical retropubic prostatectomy with bilateral pelvic lymph node dissection 01/19/2013, presents to the emergency room with a chief complaint of weakness, fatigue and shortness of breath.  Pt reports that for the past month he has been sick and eating very poorly. He states he feels much better after receiving a blood transfusion and his appetite is much better today. Pt reports eating 100% of lunch and being hungry again shortly after.    Pt meets criteria for Moderate MALNUTRITION in the context of Acute Illness as evidenced by 6% weight loss in one month and estimated energy intake <75% of estimated energy needs for >7 days.   Height: Ht Readings from Last 1 Encounters:  02/22/13 5\' 7"  (1.702 m)    Weight: Wt Readings from Last 1 Encounters:  02/22/13 154 lb 6.4 oz (70.035 kg)    Ideal Body Weight: 148 lbs  % Ideal Body Weight: 104%  Wt Readings from Last 10 Encounters:  02/22/13 154 lb 6.4 oz (70.035 kg)  02/22/13 159 lb (72.122 kg)  02/21/13 159 lb (72.122 kg)  01/19/13 165 lb (74.844 kg)  01/19/13 165 lb (74.844 kg)  01/13/13 165 lb (74.844 kg)  01/04/13 163 lb 1.3 oz (73.973 kg)   12/30/12 161 lb 8 oz (73.256 kg)  12/19/12 163 lb (73.936 kg)  12/14/12 161 lb (73.029 kg)    Usual Body Weight: 163 lbs  % Usual Body Weight: 94%  BMI:  Body mass index is 24.18 kg/(m^2).  Estimated Nutritional Needs: Kcal: 1800-2000 Protein: 75-85 grams Fluid: 1.8-2 L/day  Skin: dry, intact  Diet Order: Carb Control  EDUCATION NEEDS: -No education needs identified at this time   Intake/Output Summary (Last 24 hours) at 02/23/13 1540 Last data filed at 02/23/13 1032  Gross per 24 hour  Intake  835.5 ml  Output    450 ml  Net  385.5 ml    Last BM: 12/10   Labs:   Recent Labs Lab 02/22/13 0033 02/22/13 1245 02/23/13 0543 02/23/13 0553  NA 139 133* 139  --   K 3.0* 3.5 2.8*  --   CL 104 98 106  --   CO2 24 24 26   --   BUN 12 9 9   --   CREATININE 1.20 1.22 1.14  --   CALCIUM 8.7 8.5 8.4  --   MG  --   --   --  1.9  GLUCOSE 163* 382* 96  --     CBG (last 3)   Recent Labs  02/23/13 1126 02/23/13 1514 02/23/13 1534  GLUCAP 119* 60* 90    Scheduled Meds: . atorvastatin  80 mg Oral q1800  . carvedilol  3.125 mg Oral BID WC  . ferrous sulfate  325 mg Oral TID WC  .  fluticasone  2 spray Each Nare Daily  . gabapentin  300 mg Oral QHS  . guaiFENesin  600 mg Oral BID  . hydrALAZINE  10 mg Oral Q8H  . insulin aspart  0-5 Units Subcutaneous QHS  . insulin aspart  0-9 Units Subcutaneous TID WC  . insulin aspart  4 Units Subcutaneous TID WC  . insulin glargine  30 Units Subcutaneous QHS  . lisinopril  40 mg Oral q morning - 10a  . pantoprazole  40 mg Oral Daily  . potassium chloride  40 mEq Oral BID  . sodium chloride  3 mL Intravenous Q12H    Continuous Infusions:   Past Medical History  Diagnosis Date  . Hypertension   . High cholesterol   . GERD (gastroesophageal reflux disease)   . GI bleed     a. Recurrent GI bleed, tx with IV iron. b. Per heme notes - likely AVMs 04/2012 (tx with cauterization several months ago).  . Orthostatic  hypotension     a. Tx with florinef.  . Hematuria     a. Urology note scan from 07/2012: cystoscopy without evidence for bladder lesion, only lateral hypertrophy of posterior urethra, bladder impression from BPH. b. Pt states he had "some tests" scheduled for later in July 2014.  . Stage III chronic kidney disease     a. Stage 3 (DM with complications ->CKD, peripheral neuropathy).  . Anemia, iron deficiency 03/24/2011    a. Recurrent GI bleed, tx with periodic iron infusions.  . 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 Sleep apnea     "had mask; couldn't sleep in it" (09/20/2012)  . Type II diabetes mellitus     a. Dx 1994, uncontrolled.   . Diabetic peripheral neuropathy 2014    foot pain.  . Chronic lower back pain   . Fatty liver     on CT of 11/2010  . AVM (arteriovenous malformation) of duodenum, acquired     egds in 01/2012, 04/2011  . Polyp, colonic     Colonoscopy 01/2012 "benign" polyp  . CAD (coronary artery disease)     a. Cath 09/2012: moderate borderline CAD in mid LAD/small diagonal branch, mild RCA stenosis, to be managed medically   . BPH (benign prostatic hyperplasia)   . Prostate cancer   . Stroke   . Peripheral neuropathy     Past Surgical History  Procedure Laterality Date  . Shoulder open rotator cuff repair Left 1980's  . Inguinal hernia repair Right 2011  . Lumbar disc surgery  1980's  . Robot assisted laparoscopic radical prostatectomy N/A 01/19/2013    Procedure: ROBOTIC ASSISTED LAPAROSCOPIC RADICAL PROSTATECTOMY;  Surgeon: Valetta Fuller, MD;  Location: [facility];  Service: Urology;  Laterality: N/A;  . Lymphadenectomy Bilateral 01/19/2013    Procedure: LYMPHADENECTOMY;  Surgeon: Valetta Fuller, MD;  Location: [facility];  Service: Urology;  Laterality: Bilateral;  . Prostatectomy      Ian Malkin RD, LDN Inpatient Clinical Dietitian Pager: [phone] After Hours Pager: [phone]

## 2013-02-23 NOTE — Consult Note (Signed)
Referring Provider: No ref. provider found Primary Care Physician:  Lemont Fillers., NP Primary Gastroenterologist:  Gentry Fitz  Reason for Consultation:  Anemia; ? GIB  HPI: [name] is a 67 y.o. male past medical history significant for HTN, HLD, CKD III, DM2 uncontrolled with complications, CVA, prostate cancer s/p robotic-assisted laparoscopic radical retropubic prostatectomy with bilateral pelvic lymph node dissection 01/19/2013.  He presented to [facility] emergency room on 12/9 with a chief complaint of weakness, fatigue, and shortness of breath. He had presented to the emergency room with the same symptoms late in the evening on 12/8, he underwent a CT angio of the chest with concern for PE, which was negative.  His hemoglobin was 7.0 at that time and he was advised to followup with PCP.  He was advised by his PCP to come back to the emergency room. Repeat CBC shows stable hemoglobin at 7.0 (compared to 9.9 grams on 11/6).  He denies any fever or chills, abdominal pain, nausea or vomiting. He has no chest pain. FOBT in the emergency room was negative for bleed.  He was transfused with two units of PRBC's and Hgb today is 9.3 grams.  Vitamin B12 and folate are normal.  Ferritin was 15 (it was 180 just four months ago), iron <10.  Denies NSAID use; was taking ASA 81 mg daily, but has not been taking that since prior to his surgery.  Is on daily protonix at home.  Per his history it sounds like he had bleeding AVM's within the past year that were cauterized.  Colonoscopy and EGD within the last year done in [facility].  We are working on obtaining those records.  He said that he had a few cauterizations done and the last EGD they told him that the issue was gone, but if it returned then they would send him to [facility] to look further into his small intestine (sounds like double balloon enteroscopy).  He tells me that just after his surgery he had a couple of black BM's within a couple of days time,  but that resolved and has experienced no further issues since; says that he thought it was related to the surgery.  He denies seeing any blood in his stools.      Past Medical History  Diagnosis Date  . Hypertension   . High cholesterol   . GERD (gastroesophageal reflux disease)   . GI bleed     a. Recurrent GI bleed, tx with IV iron. b. Per heme notes - likely AVMs 04/2012 (tx with cauterization several months ago).  . Orthostatic hypotension     a. Tx with florinef.  . Hematuria     a. Urology note scan from 07/2012: cystoscopy without evidence for bladder lesion, only lateral hypertrophy of posterior urethra, bladder impression from BPH. b. Pt states he had "some tests" scheduled for later in July 2014.  . Stage III chronic kidney disease     a. Stage 3 (DM with complications ->CKD, peripheral neuropathy).  . Anemia, iron deficiency 03/24/2011    a. Recurrent GI bleed, tx with periodic iron infusions.  . 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 Sleep apnea     "had mask; couldn't sleep in it" (09/20/2012)  . Type II diabetes mellitus     a. Dx 1994, uncontrolled.   . Diabetic peripheral neuropathy 2014    foot pain.  . Chronic lower back pain   . Fatty liver     on CT of 11/2010  .  AVM (arteriovenous malformation) of duodenum, acquired     egds in 01/2012, 04/2011  . Polyp, colonic     Colonoscopy 01/2012 "benign" polyp  . CAD (coronary artery disease)     a. Cath 09/2012: moderate borderline CAD in mid LAD/small diagonal branch, mild RCA stenosis, to be managed medically   . BPH (benign prostatic hyperplasia)   . Prostate cancer   . Stroke   . Peripheral neuropathy     Past Surgical History  Procedure Laterality Date  . Shoulder open rotator cuff repair Left 1980's  . Inguinal hernia repair Right 2011  . Lumbar disc surgery  1980's  . Robot assisted laparoscopic radical prostatectomy N/A 01/19/2013    Procedure: ROBOTIC ASSISTED LAPAROSCOPIC RADICAL PROSTATECTOMY;  Surgeon: Valetta Fuller, MD;  Location: [facility] ORS;  Service: Urology;  Laterality: N/A;  . Lymphadenectomy Bilateral 01/19/2013    Procedure: LYMPHADENECTOMY;  Surgeon: Valetta Fuller, MD;  Location: [facility] ORS;  Service: Urology;  Laterality: Bilateral;  . Prostatectomy      Prior to Admission medications   Medication Sig Start Date End Date Taking? Authorizing Provider  aspirin 81 MG chewable tablet Chew by mouth daily.   Yes Historical Provider, MD  atorvastatin (LIPITOR) 80 MG tablet TAKE 1 TABLET EVERY DAY 02/01/13  Yes Sandford Craze, NP  carvedilol (COREG) 3.125 MG tablet Take 3.125 mg by mouth 2 (two) times daily with a meal.   Yes Historical Provider, MD  fluticasone (FLONASE) 50 MCG/ACT nasal spray Place 2 sprays into the nose daily.   Yes Historical Provider, MD  gabapentin (NEURONTIN) 300 MG capsule Take 300 mg by mouth at bedtime.   Yes Historical Provider, MD  guaiFENesin (MUCINEX) 600 MG 12 hr tablet Take 1 tablet (600 mg total) by mouth 2 (two) times daily. 02/22/13  Yes April K Palumbo-Rasch, MD  HYDROcodone-acetaminophen [facility]) 5-325 MG per tablet Take 1-2 tablets by mouth every 6 (six) hours as needed. 01/19/13  Yes Darol Destine. Myer Haff, PA-C  insulin glargine (LANTUS) 100 UNIT/ML injection Inject 45 Units into the skin at bedtime.    Yes Historical Provider, MD  insulin lispro (HUMALOG) 100 UNIT/ML injection Inject 5 Units into the skin 3 (three) times daily before meals.   Yes Historical Provider, MD  lisinopril (PRINIVIL,ZESTRIL) 40 MG tablet Take 40 mg by mouth every morning.   Yes Historical Provider, MD  pantoprazole (PROTONIX) 40 MG tablet Take 1 tablet (40 mg total) by mouth daily. 01/17/13  Yes Sandford Craze, NP  polyvinyl alcohol (LIQUIFILM TEARS) 1.4 % ophthalmic solution Place 1 drop into both eyes daily as needed (dry eyes).   Yes Historical Provider, MD  nitroGLYCERIN (NITROSTAT) 0.4 MG SL tablet Place 1 tablet (0.4 mg total) under the tongue every 5 (five) minutes as needed for  chest pain (up to 3 doses). 09/22/12   Dayna N Dunn, PA-C    Current Facility-Administered Medications  Medication Dose Route Frequency Provider Last Rate Last Dose  . atorvastatin (LIPITOR) tablet 80 mg  80 mg Oral q1800 Leatha Gilding, MD   80 mg at 02/22/13 1713  . carvedilol (COREG) tablet 3.125 mg  3.125 mg Oral BID WC Leatha Gilding, MD   3.125 mg at 02/23/13 0849  . ferrous sulfate tablet 325 mg  325 mg Oral TID WC Leatha Gilding, MD   325 mg at 02/23/13 0850  . fluticasone (FLONASE) 50 MCG/ACT nasal spray 2 spray  2 spray Each Nare Daily Costin Otelia Sergeant, MD  2 spray at 02/23/13 1030  . gabapentin (NEURONTIN) capsule 300 mg  300 mg Oral QHS Leatha Gilding, MD   300 mg at 02/22/13 2337  . guaiFENesin (MUCINEX) 12 hr tablet 600 mg  600 mg Oral BID Leatha Gilding, MD   600 mg at 02/23/13 1031  . HYDROcodone-acetaminophen (NORCO/VICODIN) 5-325 MG per tablet 1-2 tablet  1-2 tablet Oral Q6H PRN Leatha Gilding, MD   1 tablet at 02/22/13 1949  . insulin aspart (novoLOG) injection 0-5 Units  0-5 Units Subcutaneous QHS Leatha Gilding, MD   2 Units at 02/22/13 2340  . insulin aspart (novoLOG) injection 0-9 Units  0-9 Units Subcutaneous TID WC Costin Otelia Sergeant, MD   7 Units at 02/22/13 1719  . insulin aspart (novoLOG) injection 4 Units  4 Units Subcutaneous TID WC Leatha Gilding, MD   4 Units at 02/23/13 [phone]  . insulin glargine (LANTUS) injection 30 Units  30 Units Subcutaneous QHS Leatha Gilding, MD   30 Units at 02/22/13 2338  . lisinopril (PRINIVIL,ZESTRIL) tablet 40 mg  40 mg Oral q morning - 10a Costin Otelia Sergeant, MD   40 mg at 02/23/13 1031  . pantoprazole (PROTONIX) EC tablet 40 mg  40 mg Oral Daily Costin Otelia Sergeant, MD   40 mg at 02/23/13 1031  . polyvinyl alcohol (LIQUIFILM TEARS) 1.4 % ophthalmic solution 1 drop  1 drop Both Eyes Daily PRN Costin Otelia Sergeant, MD      . potassium chloride SA (K-DUR,KLOR-CON) CR tablet 40 mEq  40 mEq Oral BID Dorothea Ogle, MD   40 mEq at  02/23/13 1031  . sodium chloride (OCEAN) 0.65 % nasal spray 1 spray  1 spray Each Nare PRN Leda Gauze, NP   1 spray at 02/22/13 2336  . sodium chloride 0.9 % injection 3 mL  3 mL Intravenous Q12H Costin Otelia Sergeant, MD   3 mL at 02/23/13 1032  . vitamin A & D ointment             Allergies as of 02/22/2013  . (No Known Allergies)    Family History  Problem Relation Age of Onset  . Heart attack Brother     Died at 18  . Stroke Father     Died at 39  . Diabetes Sister   . Diabetes Mother   . Stomach cancer Brother   . Heart attack Sister     History   Social History  . Marital Status: Married    Spouse Name: N/A    Number of Children: 2  . Years of Education: N/A   Occupational History  . taken out of work due to back    Social History Main Topics  . Smoking status: Former Smoker -- 1.00 packs/day for 48 years    Types: Cigarettes    Quit date: 03/17/2012  . Smokeless tobacco: Never Used  . Alcohol Use: No     Comment: 09/20/2012 "Used to; stopped ~ 2009; never had problem w/it"  . Drug Use: No  . Sexual Activity: No   Other Topics Concern  . Not on file   Social History Narrative   Regular exercise: rides horses   Caffeine use: occasionally    Review of Systems: Ten point ROS is O/W negative except as mentioned in HPI.  Physical Exam: Vital signs in last 24 hours: Temp:  [97.5 F (36.4 C)-98.5 F (36.9 C)] 98.5 F (36.9 C) (12/10 0533) Pulse Rate:  [  83-113] 88 (12/10 0848) Resp:  [18-21] 20 (12/10 0848) BP: (126-183)/(66-91) 155/73 mmHg (12/10 0848) SpO2:  [99 %-100 %] 100 % (12/10 0848) Weight:  [154 lb 6.4 oz (70.035 kg)] 154 lb 6.4 oz (70.035 kg) (12/09 1541) Last BM Date: 02/23/13 General:  Alert, Well-developed, well-nourished, pleasant and cooperative in NAD Head:  Normocephalic and atraumatic. Eyes:  Sclera clear, no icterus.  Conjunctiva pink. Ears:  Normal auditory acuity. Mouth:  No deformity or lesions.   Lungs:  Clear throughout  to auscultation.  No wheezes, crackles, or rhonchi.  Heart:  Regular rate and rhythm; no murmurs, clicks, rubs, or gallops. Abdomen:  Soft, non-distended.  BS present.  Non-tender.   Rectal:  Deferred.  Heme negative in the ED.  Msk:  Symmetrical without gross deformities. Pulses:  Normal pulses noted. Extremities:  Without clubbing or edema. Neurologic:  Alert and  oriented x4;  grossly normal neurologically. Skin:  Intact without significant lesions or rashes. Psych:  Alert and cooperative. Normal mood and affect.  Intake/Output from previous day: 12/09 0701 - 12/10 0700 In: 832.5 [P.O.:240; I.V.:250; Blood:342.5] Out: 450 [Urine:450] Intake/Output this shift: Total I/O In: 3 [I.V.:3] Out: -   Lab Results:  Recent Labs  02/22/13 0033 02/22/13 1245 02/23/13 0543  WBC 6.1 4.6 5.6  HGB 7.0* 7.0* 9.3*  HCT 20.9* 20.6* 26.8*  PLT 340 329 286   BMET  Recent Labs  02/22/13 0033 02/22/13 1245 02/23/13 0543  NA 139 133* 139  K 3.0* 3.5 2.8*  CL 104 98 106  CO2 24 24 26   GLUCOSE 163* 382* 96  BUN 12 9 9   CREATININE 1.20 1.22 1.14  CALCIUM 8.7 8.5 8.4   LFT  Recent Labs  02/21/13 0909 02/22/13 1245  PROT 5.8* 6.2  ALBUMIN 3.0* 2.5*  AST 9 9  ALT 9 7  ALKPHOS 116 121*  BILITOT 0.3 0.3  BILIDIR <0.1  --   IBILI NOT CALC  --    Studies/Results: Dg Chest 2 View  02/22/2013   CLINICAL DATA:  Dizziness, cough.  History of prostate cancer.  EXAM: CHEST  2 VIEW  COMPARISON:  Chest radiograph December 03, 2012  FINDINGS: Cardiomediastinal silhouette is unremarkable. The lungs are clear without pleural effusions or focal consolidations. Pulmonary vasculature is unremarkable. Trachea projects midline and there is no pneumothorax. Soft tissue planes and included osseous structures are nonsuspicious. Mild degenerative change of the thoracic spine.  IMPRESSION: No active cardiopulmonary disease.   Electronically Signed   By: Awilda Metro   On: 02/22/2013 01:20   Ct  Angio Chest Pe W/cm &/or Wo Cm  02/22/2013   CLINICAL DATA:  Cough, lightheaded headedness, recent surgery for prostate cancer.  EXAM: CT ANGIOGRAPHY CHEST WITH CONTRAST  TECHNIQUE: Multidetector CT imaging of the chest was performed using the standard protocol during bolus administration of intravenous contrast. Multiplanar CT image reconstructions including MIPs were obtained to evaluate the vascular anatomy.  CONTRAST:  80mL OMNIPAQUE IOHEXOL 350 MG/ML SOLN  COMPARISON:  Chest radiograph February 22, 2013 and CT of the chest November 03, 2012.  FINDINGS: Mildly delayed bolus timing. Main pulmonary artery is not enlarged. No pulmonary arterial filling defects to the level of the subsegmental arteries. Mild contrast admixture in the lower lobe segment segmental arteries.  Minimal centrilobular emphysema. No solid pulmonary nodules or masses. No focal consolidations or pleural effusions. 3 mm right lower lobe, posterior segment ground-glass nodule is below the level for surveillance recommendations . Mild bronchial wall thickening, most  notable in left lower lobe. Tracheobronchial tree is patent and midline, no pneumothorax.  Thoracic aorta is normal in course and caliber, with mild calcific atherosclerosis. The heart and pericardium are unremarkable. No lymphadenopathy by CT size criteria. Thoracic esophagus is nonsuspicious. Included view of the abdomen is unremarkable. Very mild gynecomastia. Mild degenerative change of the included right shoulder. Osseous structures are nonsuspicious.  Review of the MIP images confirms the above findings.  IMPRESSION: No pulmonary arterial embolism.  Minimal bronchial wall thickening may reflect bronchitis, most notable in the left lung base. 3 mm right lower lobe sub solid pulmonary nodule could be reactive.   Electronically Signed   By: Awilda Metro   On: 02/22/2013 01:32    IMPRESSION:  -IDA with subacute drop in Hgb from 9.9 grams on 11/6 to 7.0 grams on admission.   S/p two units PRBC's with good response to 9.3 grams. -History of gastrointestinal bleeding:  Per his history it sounds like he had bleeding AVM's within the past year that were cauterized.  Colonoscopy and EGD within the last year done in [facility].  We are working on obtaining those records.  He was heme negative in ED on this occasion. -Prostate cancer s/p recent robotic-assisted laparoscopic radical retropubic prostatectomy with bilateral pelvic lymph node dissection 01/19/2013. -IDDM -CKD stage III -History of CVA  PLAN: -Obtain records from GI in [facility] before making any further recommendations for endoscopic evaluation. -Monitor Hgb and transfuse further if needed. -Continue daily PPI.   ZEHR, JESSICA D.  02/23/2013, 12:07 PM  Pager number [phone]  GI ATTENDING  History, laboratories, x-rays reviewed. Patient seen and examined. Agree with extensive right up as above. Patient has chronic anemia. Recent prostate surgery. Hemoglobin down almost 3 g from recent discharge. Has iron deficiency. Has what sounds like AVMs for which he has been taken care of in [facility]. Endoscopic evaluations as recent as 1 year ago. Records requested. No active GI bleeding. Actually, Hemoccult negative stool. Would be okay to discharge him home on iron therapy and followup with his local GI doctors. We will review outside records when available. We will see him tomorrow. Thank you  Wilhemina Bonito. Eda Keys., M.D. [facility] Division of Gastroenterology

## 2013-02-23 NOTE — Care Management Note (Addendum)
    Page 1 of 1   02/24/2013     12:52:07 PM   CARE MANAGEMENT NOTE 02/24/2013  Patient:  [name], [name]   Account Number:  [id number]  Date Initiated:  02/23/2013  Documentation initiated by:  [facility]  Subjective/Objective Assessment:   67 Y/O M ADMITTED W/ANEMIA.     Action/Plan:   FROM HOME.HAS PCP,PHARMACY.   Anticipated DC Date:  02/24/2013   Anticipated DC Plan:  HOME/SELF CARE      DC Planning Services  CM consult      Choice offered to / List presented to:             Status of service:  Completed, signed off Medicare Important Message given?   (If response is "NO", the following Medicare IM given date fields will be blank) Date Medicare IM given:   Date Additional Medicare IM given:    Discharge Disposition:  HOME/SELF CARE  Per UR Regulation:  Reviewed for med. necessity/level of care/duration of stay  If discussed at Long Length of Stay Meetings, dates discussed:    Comments:  02/24/13 Charlei Ramsaran RN,BSN NCM [phone] D/C HOME NO NEEDS OR ORDERS.  02/23/13 Novis League RN,BSN NCM [phone]

## 2013-02-23 NOTE — Telephone Encounter (Signed)
Never mind, I see pt was admitted.  Will contact him after discharge. Thanks.

## 2013-02-23 NOTE — Progress Notes (Signed)
Patient ID: [name], male   DOB: [birth year], 67 y.o.   MRN: [id number] [facility] PROGRESS NOTE  [name] WUJ:[id number] DOB: [birth year] DOA: 02/22/2013 PCP: Lemont Fillers., NP  Brief narrative: 67 y.o. male with HTN, HLD, GI bleed in the past and has seen GI specialist in [facility], CKD stage III, DM2, prostate cancer s/p robotic-assisted laparoscopic radical retropubic prostatectomy with bilateral pelvic lymph node dissection 01/19/2013 who presented to [facility] ED with main concern of one week duration of progressively worsening generalized weakness, malaise and shortness of breath at rest and with exertion. In ED, it was noted that Hg was & and pt was transfused two units of PRBC. He has denied chest pain, no specific abdominal or urinary concerns.   Principal Problem:   Acute blood loss anemia - pt transfused two units of PRBC 12/09 with appropriate increase in Hg - anemia panel significant for Iron deficiency anemia - continue PPI, Iron supplementation - follow up on GI recommendations  - repeat CBC in AM Active Problems:   Anemia, iron deficiency - iron supplements as noted above    Diabetes mellitus type 2, uncontrolled - A1C > 9 (02/21/2013) - continue Lantus and SSI for now but due to hypoglycemic events will lower the dose of Lantus from 30 U --> 10 U for now  - diabetic educator consultation requested    Unspecified essential hypertension - accelerated - will add Hydralazine 10 mg tablet scheduled - may need PRN coverage as needed    Hyperlipidemia - continue statin    Hypokalemia - will supplement and will repeat BMP in AM   Hematuria - monitor closely    Diabetic neuropathy - continue Neurontin    CAD (coronary artery disease) - clinically compensated    Prostate cancer - Dr. Isabel Caprice made aware of pt's admission    CVA (cerebral infarction) - in the past, stable   Consultants:  GI  Procedures/Studies: Dg Chest 2 View   02/22/2013   No active  cardiopulmonary disease.  Ct Angio Chest Pe W/cm &/or Wo Cm   02/22/2013  No pulmonary arterial embolism.  Minimal bronchial wall thickening may reflect bronchitis, most notable in the left lung base. 3 mm right lower lobe sub solid pulmonary nodule could be reactive.  Antibiotics:  None  Code Status: Full Family Communication: Pt at bedside and wife over the phone  Disposition Plan: Home when medically stable  HPI/Subjective: No events overnight.   Objective: Filed Vitals:   02/22/13 2305 02/23/13 0533 02/23/13 0848 02/23/13 1409  BP: 148/76 150/80 155/73 173/85  Pulse: 84 83 88 87  Temp: 98.1 F (36.7 C) 98.5 F (36.9 C)  97.9 F (36.6 C)  TempSrc: Oral Oral    Resp: 18 20 20 18   Height:      Weight:      SpO2: 100% 99% 100% 100%    Intake/Output Summary (Last 24 hours) at 02/23/13 1658 Last data filed at 02/23/13 1032  Gross per 24 hour  Intake  585.5 ml  Output    450 ml  Net  135.5 ml    Exam:   General:  Pt is alert, follows commands appropriately, not in acute distress  Cardiovascular: Regular rate and rhythm, S1/S2, no murmurs, no rubs, no gallops  Respiratory: Clear to auscultation bilaterally, no wheezing, no crackles, no rhonchi  Abdomen: Soft, non tender, non distended, bowel sounds present, no guarding  Extremities: No edema, pulses DP and PT palpable bilaterally  Neuro: Grossly  nonfocal  Data Reviewed: Basic Metabolic Panel:  Recent Labs Lab 02/21/13 0909 02/22/13 0033 02/22/13 1245 02/23/13 0543 02/23/13 0553  NA 137 139 133* 139  --   K 3.5 3.0* 3.5 2.8*  --   CL 101 104 98 106  --   CO2 27 24 24 26   --   GLUCOSE 343* 163* 382* 96  --   BUN 8 12 9 9   --   CREATININE 1.10 1.20 1.22 1.14  --   CALCIUM 8.5 8.7 8.5 8.4  --   MG  --   --   --   --  1.9   Liver Function Tests:  Recent Labs Lab 02/21/13 0909 02/22/13 1245  AST 9 9  ALT 9 7  ALKPHOS 116 121*  BILITOT 0.3 0.3  PROT 5.8* 6.2  ALBUMIN 3.0* 2.5*   CBC:  Recent  Labs Lab 02/22/13 0033 02/22/13 1245 02/23/13 0543  WBC 6.1 4.6 5.6  NEUTROABS 3.8 3.2  --   HGB 7.0* 7.0* 9.3*  HCT 20.9* 20.6* 26.8*  MCV 85.3 83.1 81.5  PLT 340 329 286   Cardiac Enzymes:  Recent Labs Lab 02/22/13 0033  TROPONINI <0.30   CBG:  Recent Labs Lab 02/22/13 2314 02/23/13 0730 02/23/13 1126 02/23/13 1514 02/23/13 1534  GLUCAP 204* 75 119* 60* 90   Scheduled Meds: . atorvastatin  80 mg Oral q1800  . carvedilol  3.125 mg Oral BID WC  . feeding supplement   237 mL Oral Q24H  . ferrous sulfate  325 mg Oral TID WC  . fluticasone  2 spray Each Nare Daily  . gabapentin  300 mg Oral QHS  . guaiFENesin  600 mg Oral BID  . hydrALAZINE  10 mg Oral Q8H  . insulin aspart  0-5 Units Subcutaneous QHS  . insulin aspart  0-9 Units Subcutaneous TID WC  . insulin aspart  4 Units Subcutaneous TID WC  . insulin glargine  30 Units Subcutaneous QHS  . lisinopril  40 mg Oral q morning - 10a  . multivitamin w  1 tablet Oral Daily  . pantoprazole  40 mg Oral Daily  . potassium chloride  40 mEq Oral BID   Continuous Infusions:   Debbora Presto, MD  [facility] Pager [phone]  If 7PM-7AM, please contact night-coverage [email or web address] Password [email or web address] 02/23/2013, 4:58 PM   LOS: 1 day

## 2013-02-23 NOTE — Progress Notes (Signed)
Hypoglycemic Event  CBG: 60  Treatment: 15g snack   Symptoms:  Dizziness, hungry, and weakness.  Follow-up CBG: Time:1530 CBG Result:90  Possible Reasons for Event: Decreased carbohydrate intake while in hospital than when at home.   Comments/MD notified: Paged, waiting for a return call back.     [name]  Remember to initiate Hypoglycemia Order Set & complete

## 2013-02-24 ENCOUNTER — Ambulatory Visit: Payer: Medicare Other | Admitting: Nurse Practitioner

## 2013-02-24 DIAGNOSIS — K5520 Angiodysplasia of colon without hemorrhage: Secondary | ICD-10-CM

## 2013-02-24 LAB — BASIC METABOLIC PANEL
BUN: 11 mg/dL (ref 6–23)
Creatinine, Ser: 1.17 mg/dL (ref 0.50–1.35)
GFR calc Af Amer: 73 mL/min — ABNORMAL LOW (ref >90)
GFR calc non Af Amer: 63 mL/min — ABNORMAL LOW (ref >90)
Potassium: 3.9 mEq/L (ref 3.5–5.1)

## 2013-02-24 LAB — CBC
HCT: 28.6 % — ABNORMAL LOW (ref 39.0–52.0)
MCHC: 33.9 g/dL (ref 30.0–36.0)
MCV: 82.4 fL (ref 78.0–100.0)
RDW: 13.7 % (ref 11.5–15.5)

## 2013-02-24 MED ORDER — HYDRALAZINE HCL 10 MG PO TABS: 10.0000 mg | ORAL_TABLET | Freq: Three times a day (TID) | ORAL | Status: DC

## 2013-02-24 MED ORDER — SALINE SPRAY 0.65 % NA SOLN: 1.0000 | NASAL | Status: DC | PRN

## 2013-02-24 MED ORDER — HYDROCODONE-ACETAMINOPHEN 5-325 MG PO TABS: 1.0000 | ORAL_TABLET | Freq: Four times a day (QID) | ORAL | Status: DC | PRN

## 2013-02-24 MED ORDER — HYDRALAZINE HCL 20 MG/ML IJ SOLN
5.0000 mg | Freq: Once | INTRAMUSCULAR | Status: AC
  Administered 2013-02-24: 07:00:00 5 mg via INTRAVENOUS
  Filled 2013-02-24: qty 1

## 2013-02-24 MED ORDER — FERROUS SULFATE 325 (65 FE) MG PO TABS: 325.0000 mg | ORAL_TABLET | Freq: Three times a day (TID) | ORAL | Status: DC

## 2013-02-24 NOTE — Assessment & Plan Note (Signed)
Pt has follow up with ENT to discuss.

## 2013-02-24 NOTE — Progress Notes (Signed)
Inpatient Diabetes Program Recommendations  AACE/ADA: New Consensus Statement on Inpatient Glycemic Control (2013)  Target Ranges:  Prepandial:   less than 140 mg/dL      Peak postprandial:   less than 180 mg/dL (1-2 hours)      Critically ill patients:  140 - 180 mg/dL   Reason for Visit: MD Consult- Diabetes Management  Note:  Patient is for discharge today.  Patient with Hgb A1C of 9.9 which would normally indicate sub-optimal glycemic control prior to admission with average glucose of 237 mg/dl.  However, in his case with low Hgb of 7.0, Hgb A1C may not be accurate-- average glucose could potentially be either higher or lower.    Patient states that since his surgery, his CBG's at home have generally been lower.  If he takes the Lantus 45 units at HS and Humalog 5 units tid, he experienced low blood sugar.  "Lately" he says that he has not been taking his Lantus at all.  Before meals if his CBG is >160 mg/dl, he takes Humalog 3 units as meal coverage.  If CBG is > 200 mg/dl he takes the full 5 units meal coverage.  Has used a correction scale in the past and knows how to calculate correction plus meal coverage.  At home he typically checks his CBG's before meals.    Sees Sandford Craze, NP as his PCP.  Also sees Dr. Vanessa Buhl for his diabetes.    Received Lantus 10 units last night and before breakfast CBG was 224 mg/dl.  Received Lantus 30 units at HS 12/9 with before breakfast CBG of 75 mg/dl-- and subsequently experienced hypoglycemia.  Recommend patient be discharged home with a reduced Lantus dose of 10 to 12 units.  May also consider reducing home meal coverage to 3 to 4 units tid plus a sensitive correction scale starting at 150 mg/dl.    Strongly encouraged patient to follow-up with Dr. Vanessa [place] ASAP after discharge and record all CBG's and insulin taken to share with MD.  Thank you for the referral.  Troy Edling S. [name], RN, CNS, CDE Inpatient Diabetes Program, team pager [phone]

## 2013-02-24 NOTE — Assessment & Plan Note (Signed)
S/p prostatectomy.  Voiding. Management per Urology/oncology

## 2013-02-24 NOTE — Progress Notes (Signed)
[facility] Progress Note  Subjective:  Feels fine.  Had a normal BM this AM.  He actually was seen by Dr. Christella Hartigan in July for the same issue of IDA and decision was just to monitor at that time (I did not see this previously and patient does not recall being seen in our office).  He will follow-up with Dr. Christella Hartigan.    Objective:  Vital signs in last 24 hours: Temp:  [97.9 F (36.6 C)-98.6 F (37 C)] 98.6 F (37 C) (12/11 0500) Pulse Rate:  [80-90] 90 (12/11 0657) Resp:  [16-18] 18 (12/11 0657) BP: (158-186)/(74-93) 159/85 mmHg (12/11 0657) SpO2:  [100 %] 100 % (12/11 0657) Last BM Date: 02/24/13 General:  Alert, Well-developed, in NAD Heart:  Regular rate and rhythm; no murmurs Pulm:  CTAB.  No W/R/R. Abdomen:  Soft, non-distended.  BS present.  Non-tender. Extremities:  Without edema. Neurologic:  Alert and  oriented x4;  grossly normal neurologically. Psych:  Alert and cooperative. Normal mood and affect.  Intake/Output from previous day: 12/10 0701 - 12/11 0700 In: 3 [I.V.:3] Out: -   Lab Results:  Recent Labs  02/22/13 1245 02/23/13 0543 02/24/13 0501  WBC 4.6 5.6 8.0  HGB 7.0* 9.3* 9.7*  HCT 20.6* 26.8* 28.6*  PLT 329 286 309   BMET  Recent Labs  02/22/13 1245 02/23/13 0543 02/24/13 0501  NA 133* 139 136  K 3.5 2.8* 3.9  CL 98 106 104  CO2 24 26 21   GLUCOSE 382* 96 325*  BUN 9 9 11   CREATININE 1.22 1.14 1.17  CALCIUM 8.5 8.4 8.4   LFT  Recent Labs  02/22/13 1245  PROT 6.2  ALBUMIN 2.5*  AST 9  ALT 7  ALKPHOS 121*  BILITOT 0.3   Assessment / Plan: -IDA with subacute drop in Hgb from 9.9 grams on 11/6 to 7.0 grams on admission. S/p two units PRBC's with good response, and Hgb stable this AM at 9.7 grams.  -History of gastrointestinal bleeding:  History of AVM's according to endoscopy reports from [facility].  He was heme negative in ED on this occasion.  Saw Dr. Christella Hartigan   -Prostate cancer s/p recent robotic-assisted laparoscopic  radical retropubic prostatectomy with bilateral pelvic lymph node dissection 01/19/2013.  -IDDM  -CKD stage III  -History of CVA   -Monitor Hgb as outpatient per PCP.  -Continue daily PPI.  -Iron 365 mg TID was already started by the hospitalists. -Further decision regarding repeat endoscopy/enteroscopy will be deferred to Dr. Christella Hartigan at patient's follow-up visit.    LOS: 2 days   ZEHR, JESSICA D.  02/24/2013, 9:12 AM  Pager number [phone]   GI ATTENDING  Interval history and labs reviewed. Agree with above. No active GI bleeding. Fold records obtained. Has been seen by Dr. Christella Hartigan. History of AVMs with Dr. Villa Herb and [facility]. Agree with chronic iron supplementation and GI followup with Dr. Christella Hartigan as arranged. Discussed with patient and wife by telephone. Okay for discharge. Will sign off.  Wilhemina Bonito. Eda Keys., M.D. [facility] Division of Gastroenterology

## 2013-02-24 NOTE — Discharge Summary (Addendum)
Physician Discharge Summary  [name]:[id number] DOB: September 25, 1945 DOA: 02/22/2013  PCP: Lemont Fillers., NP  Admit date: 02/22/2013 Discharge date: 02/24/2013  Recommendations for Outpatient Follow-up:  1. Pt will need to follow up with PCP in 2-3 weeks post discharge 2. Please obtain BMP to evaluate electrolytes and kidney function 3. Please also check CBC to evaluate Hg and Hct levels 4. Pt has an appointment scheduled with GI specialist 5. Pt made aware of the need to call PCP to schedule an appointment as soon as possible  6. Please note hydralazine was added for better BP control   Discharge Diagnoses: Acute blood loss anemia  Principal Problem:   Anemia Active Problems:   Anemia, iron deficiency   Diabetes mellitus type 2, uncontrolled   Unspecified essential hypertension   Hyperlipidemia   Hematuria   Diabetic neuropathy   CAD (coronary artery disease)   Prostate cancer   CVA (cerebral infarction)  Discharge Condition: Stable  Diet recommendation: Heart healthy diet discussed in details   Brief narrative:  67 y.o. male with HTN, HLD, GI bleed in the past and has seen GI specialist in [facility], CKD stage III, DM2, prostate cancer s/p robotic-assisted laparoscopic radical retropubic prostatectomy with bilateral pelvic lymph node dissection 01/19/2013 who presented to [facility] ED with main concern of one week duration of progressively worsening generalized weakness, malaise and shortness of breath at rest and with exertion. In ED, it was noted that Hg was & and pt was transfused two units of PRBC. He has denied chest pain, no specific abdominal or urinary concerns.   Principal Problem:  Acute blood loss anemia  - pt transfused two units of PRBC 12/09 with appropriate increase in Hg  - anemia panel significant for Iron deficiency anemia  - continue PPI, Iron supplementation  - Hg remains stable over 48 hours, no need for additional transfusion  Active Problems:   Anemia, iron deficiency  - iron supplements as noted above  Diabetes mellitus type 2, uncontrolled  - A1C > 9 (02/21/2013)  - continue Lantus upon discharge  - diabetic educator consultation requested and we appreciate input  Unspecified essential hypertension  - accelerated  - Hydralazine added Hyperlipidemia  - continue statin  Hypokalemia  - within normal limits this AM Hematuria  - monitor closely  - pt advised to follow up with Dr Isabel Caprice  Diabetic neuropathy  - continue Neurontin  CAD (coronary artery disease)  - clinically compensated  Prostate cancer  - Dr. Isabel Caprice made aware of pt's admission by admitting doctor  CVA (cerebral infarction)  - in the past, stable  Moderate malnutrition - secondary to multiple medical conditions and resulting poor oral intake   Consultants:  GI Procedures/Studies:  Dg Chest 2 View 02/22/2013 No active cardiopulmonary disease.  Ct Angio Chest Pe W/cm &/or Wo Cm 02/22/2013 No pulmonary arterial embolism. Minimal bronchial wall thickening may reflect bronchitis, most notable in the left lung base. 3 mm right lower lobe sub solid pulmonary nodule could be reactive. Antibiotics:  None  Code Status: Full  Family Communication: Pt at bedside and wife over the phone   Discharge Exam: Filed Vitals:   02/24/13 0657  BP: 159/85  Pulse: 90  Temp:   Resp: 18   Filed Vitals:   02/24/13 0500 02/24/13 0603 02/24/13 0605 02/24/13 0657  BP: 186/93 166/86 172/86 159/85  Pulse: 87 86  90  Temp: 98.6 F (37 C)     TempSrc: Oral     Resp:  18 16  18   Height:      Weight:      SpO2: 100% 100%  100%    General: Pt is alert, follows commands appropriately, not in acute distress Cardiovascular: Regular rate and rhythm, S1/S2 +, no murmurs, no rubs, no gallops Respiratory: Clear to auscultation bilaterally, no wheezing, no crackles, no rhonchi Abdominal: Soft, non tender, non distended, bowel sounds +, no guarding Extremities: no edema, no  cyanosis, pulses palpable bilaterally DP and PT Neuro: Grossly nonfocal  Discharge Instructions  Discharge Orders   Future Appointments Provider Department Dept Phone   02/24/2013 2:00 PM Ronal Fear, NP [facility] [phone]   02/28/2013 1:30 PM [name] [facility] [phone]   02/28/2013 2:00 PM [name], MD [facility] [phone]   02/28/2013 3:00 PM [facility] [phone]   04/06/2013 9:15 AM [name], MD [facility] [phone]   05/23/2013 8:15 AM Sandford Craze, NP  [facility] [phone]   Future Orders Complete By Expires   Diet - low sodium heart healthy  As directed    Increase activity slowly  As directed        Medication List         aspirin 81 MG chewable tablet  Chew by mouth daily.     atorvastatin 80 MG tablet  Commonly known as:  LIPITOR  TAKE 1 TABLET EVERY DAY     carvedilol 3.125 MG tablet  Commonly known as:  COREG  Take 3.125 mg by mouth 2 (two) times daily with a meal.     ferrous sulfate 325 (65 FE) MG tablet  Take 1 tablet (325 mg total) by mouth 3 (three) times daily with meals.     fluticasone 50 MCG/ACT nasal spray  Commonly known as:  FLONASE  Place 2 sprays into the nose daily.     gabapentin 300 MG capsule  Commonly known as:  NEURONTIN  Take 300 mg by mouth at bedtime.     guaiFENesin 600 MG 12 hr tablet  Commonly known as:  MUCINEX  Take 1 tablet (600 mg total) by mouth 2 (two) times daily.     hydrALAZINE 10 MG tablet  Commonly known as:  APRESOLINE  Take 1 tablet (10 mg total) by mouth every 8 (eight) hours.     HYDROcodone-acetaminophen 5-325 MG per tablet  Commonly known as:  NORCO  Take 1-2 tablets by mouth every 6 (six) hours as needed.     insulin glargine 100 UNIT/ML injection  Commonly known as:  LANTUS  Inject 45  Units into the skin at bedtime.     insulin lispro 100 UNIT/ML injection  Commonly known as:  HUMALOG  Inject 5 Units into the skin 3 (three) times daily before meals.     lisinopril 40 MG tablet  Commonly known as:  PRINIVIL,ZESTRIL  Take 40 mg by mouth every morning.     nitroGLYCERIN 0.4 MG SL tablet  Commonly known as:  NITROSTAT  Place 1 tablet (0.4 mg total) under the tongue every 5 (five) minutes as needed for chest pain (up to 3 doses).     pantoprazole 40 MG tablet  Commonly known as:  PROTONIX  Take 1 tablet (40 mg total) by mouth daily.     polyvinyl alcohol 1.4 % ophthalmic solution  Commonly known as:  LIQUIFILM TEARS  Place 1 drop into both eyes daily as needed (dry eyes).           Follow-up Information   Follow up with [name], MD On 04/06/2013. (9:15 am)    Specialty:  Gastroenterology   Contact information:   [address] [place] [address] [phone]       Follow up with Lemont Fillers., NP In 2 weeks.   Specialty:  Internal Medicine   Contact information:   [address] [place] [address] [phone]       Follow up with Debbora Presto, MD. (As needed if symptoms worsen [phone])    Specialty:  Internal Medicine   Contact information:   [address] [place] [address] [phone]        The results of significant diagnostics from this hospitalization (including imaging, microbiology, ancillary and laboratory) are listed below for reference.     Microbiology: No results found for this or any previous visit (from the past 240 hour(s)).   Labs: Basic Metabolic Panel:  Recent Labs Lab 02/21/13 0909 02/22/13 0033 02/22/13 1245 02/23/13 0543 02/23/13 0553 02/24/13 0501  NA 137 139 133* 139  --  136  K 3.5 3.0* 3.5 2.8*  --  3.9  CL 101 104 98 106  --  104  CO2 27 24 24 26   --  21  GLUCOSE 343* 163* 382* 96  --  325*  BUN 8 12 9 9   --  11  CREATININE 1.10 1.20 1.22 1.14  --   1.17  CALCIUM 8.5 8.7 8.5 8.4  --  8.4  MG  --   --   --   --  1.9  --    Liver Function Tests:  Recent Labs Lab 02/21/13 0909 02/22/13 1245  AST 9 9  ALT 9 7  ALKPHOS 116 121*  BILITOT 0.3 0.3  PROT 5.8* 6.2  ALBUMIN 3.0* 2.5*   CBC:  Recent Labs Lab 02/22/13 0033 02/22/13 1245 02/23/13 0543 02/24/13 0501  WBC 6.1 4.6 5.6 8.0  NEUTROABS 3.8 3.2  --   --   HGB 7.0* 7.0* 9.3* 9.7*  HCT 20.9* 20.6* 26.8* 28.6*  MCV 85.3 83.1 81.5 82.4  PLT 340 329 286 309   Cardiac Enzymes:  Recent Labs Lab 02/22/13 0033  TROPONINI <0.30   CBG:  Recent Labs Lab 02/23/13 1514 02/23/13 1534 02/23/13 1704 02/23/13 2105 02/24/13 0727  GLUCAP 60* 90 202* 200* 224*   SIGNED: Time coordinating discharge: Over 30 minutes  Debbora Presto, MD  [facility] 02/24/2013, 10:46 AM Pager [phone]  If 7PM-7AM, please contact night-coverage [email or web address] Password [email or web address]

## 2013-02-26 LAB — TYPE AND SCREEN
Antibody Screen: NEGATIVE
Unit division: 0
Unit division: 0

## 2013-02-27 ENCOUNTER — Other Ambulatory Visit: Payer: Self-pay | Admitting: Family

## 2013-02-28 ENCOUNTER — Other Ambulatory Visit: Payer: Self-pay | Admitting: *Deleted

## 2013-02-28 ENCOUNTER — Other Ambulatory Visit (HOSPITAL_BASED_OUTPATIENT_CLINIC_OR_DEPARTMENT_OTHER): Payer: Medicare Other | Admitting: Lab

## 2013-02-28 ENCOUNTER — Inpatient Hospital Stay (HOSPITAL_BASED_OUTPATIENT_CLINIC_OR_DEPARTMENT_OTHER)
Admission: EM | Admit: 2013-02-28 | Discharge: 2013-03-03 | DRG: 356 | Disposition: A | Discharge status: Home / Self Care | Payer: Medicare Other | Attending: Internal Medicine | Admitting: Internal Medicine

## 2013-02-28 ENCOUNTER — Ambulatory Visit (HOSPITAL_BASED_OUTPATIENT_CLINIC_OR_DEPARTMENT_OTHER): Payer: Medicare Other | Admitting: Hematology & Oncology

## 2013-02-28 ENCOUNTER — Ambulatory Visit (HOSPITAL_BASED_OUTPATIENT_CLINIC_OR_DEPARTMENT_OTHER): Payer: Medicare Other

## 2013-02-28 ENCOUNTER — Ambulatory Visit (HOSPITAL_COMMUNITY)
Admission: RE | Admit: 2013-02-28 | Discharge: 2013-02-28 | Disposition: A | Discharge status: Home / Self Care | Payer: Medicare Other | Source: Ambulatory Visit | Attending: Hematology & Oncology | Admitting: Hematology & Oncology

## 2013-02-28 ENCOUNTER — Encounter (HOSPITAL_BASED_OUTPATIENT_CLINIC_OR_DEPARTMENT_OTHER): Payer: Self-pay | Admitting: Emergency Medicine

## 2013-02-28 ENCOUNTER — Telehealth: Payer: Self-pay | Admitting: Family

## 2013-02-28 VITALS — BP 100/54 | HR 100 | Temp 98.2°F | Resp 18 | Ht 67.0 in | Wt 159.0 lb

## 2013-02-28 DIAGNOSIS — D649 Anemia, unspecified: Secondary | ICD-10-CM

## 2013-02-28 DIAGNOSIS — K922 Gastrointestinal hemorrhage, unspecified: Secondary | ICD-10-CM | POA: Diagnosis present

## 2013-02-28 DIAGNOSIS — I129 Hypertensive chronic kidney disease with stage 1 through stage 4 chronic kidney disease, or unspecified chronic kidney disease: Secondary | ICD-10-CM | POA: Diagnosis present

## 2013-02-28 DIAGNOSIS — D509 Iron deficiency anemia, unspecified: Secondary | ICD-10-CM

## 2013-02-28 DIAGNOSIS — R490 Dysphonia: Secondary | ICD-10-CM

## 2013-02-28 DIAGNOSIS — Z79899 Other long term (current) drug therapy: Secondary | ICD-10-CM

## 2013-02-28 DIAGNOSIS — C61 Malignant neoplasm of prostate: Secondary | ICD-10-CM | POA: Insufficient documentation

## 2013-02-28 DIAGNOSIS — K5520 Angiodysplasia of colon without hemorrhage: Principal | ICD-10-CM | POA: Diagnosis present

## 2013-02-28 DIAGNOSIS — M545 Low back pain, unspecified: Secondary | ICD-10-CM | POA: Diagnosis present

## 2013-02-28 DIAGNOSIS — Z8673 Personal history of transient ischemic attack (TIA), and cerebral infarction without residual deficits: Secondary | ICD-10-CM

## 2013-02-28 DIAGNOSIS — K5521 Angiodysplasia of colon with hemorrhage: Secondary | ICD-10-CM

## 2013-02-28 DIAGNOSIS — J690 Pneumonitis due to inhalation of food and vomit: Secondary | ICD-10-CM

## 2013-02-28 DIAGNOSIS — J449 Chronic obstructive pulmonary disease, unspecified: Secondary | ICD-10-CM | POA: Diagnosis present

## 2013-02-28 DIAGNOSIS — K219 Gastro-esophageal reflux disease without esophagitis: Secondary | ICD-10-CM | POA: Diagnosis present

## 2013-02-28 DIAGNOSIS — D62 Acute posthemorrhagic anemia: Secondary | ICD-10-CM | POA: Diagnosis present

## 2013-02-28 DIAGNOSIS — G8929 Other chronic pain: Secondary | ICD-10-CM | POA: Diagnosis present

## 2013-02-28 DIAGNOSIS — M549 Dorsalgia, unspecified: Secondary | ICD-10-CM

## 2013-02-28 DIAGNOSIS — I6350 Cerebral infarction due to unspecified occlusion or stenosis of unspecified cerebral artery: Secondary | ICD-10-CM

## 2013-02-28 DIAGNOSIS — D631 Anemia in chronic kidney disease: Secondary | ICD-10-CM

## 2013-02-28 DIAGNOSIS — E1149 Type 2 diabetes mellitus with other diabetic neurological complication: Secondary | ICD-10-CM

## 2013-02-28 DIAGNOSIS — E785 Hyperlipidemia, unspecified: Secondary | ICD-10-CM | POA: Diagnosis present

## 2013-02-28 DIAGNOSIS — I639 Cerebral infarction, unspecified: Secondary | ICD-10-CM

## 2013-02-28 DIAGNOSIS — Z87891 Personal history of nicotine dependence: Secondary | ICD-10-CM

## 2013-02-28 DIAGNOSIS — I10 Essential (primary) hypertension: Secondary | ICD-10-CM

## 2013-02-28 DIAGNOSIS — E1165 Type 2 diabetes mellitus with hyperglycemia: Secondary | ICD-10-CM

## 2013-02-28 DIAGNOSIS — N529 Male erectile dysfunction, unspecified: Secondary | ICD-10-CM

## 2013-02-28 DIAGNOSIS — R42 Dizziness and giddiness: Secondary | ICD-10-CM

## 2013-02-28 DIAGNOSIS — I2510 Atherosclerotic heart disease of native coronary artery without angina pectoris: Secondary | ICD-10-CM | POA: Diagnosis present

## 2013-02-28 DIAGNOSIS — J189 Pneumonia, unspecified organism: Secondary | ICD-10-CM | POA: Diagnosis present

## 2013-02-28 DIAGNOSIS — I951 Orthostatic hypotension: Secondary | ICD-10-CM

## 2013-02-28 DIAGNOSIS — R319 Hematuria, unspecified: Secondary | ICD-10-CM

## 2013-02-28 DIAGNOSIS — E1142 Type 2 diabetes mellitus with diabetic polyneuropathy: Secondary | ICD-10-CM | POA: Diagnosis present

## 2013-02-28 DIAGNOSIS — E1140 Type 2 diabetes mellitus with diabetic neuropathy, unspecified: Secondary | ICD-10-CM | POA: Diagnosis present

## 2013-02-28 DIAGNOSIS — Z8546 Personal history of malignant neoplasm of prostate: Secondary | ICD-10-CM

## 2013-02-28 DIAGNOSIS — N400 Enlarged prostate without lower urinary tract symptoms: Secondary | ICD-10-CM | POA: Diagnosis present

## 2013-02-28 DIAGNOSIS — Z720 Tobacco use: Secondary | ICD-10-CM

## 2013-02-28 DIAGNOSIS — IMO0002 Reserved for concepts with insufficient information to code with codable children: Secondary | ICD-10-CM | POA: Diagnosis present

## 2013-02-28 DIAGNOSIS — K5900 Constipation, unspecified: Secondary | ICD-10-CM | POA: Diagnosis not present

## 2013-02-28 DIAGNOSIS — E876 Hypokalemia: Secondary | ICD-10-CM

## 2013-02-28 DIAGNOSIS — B353 Tinea pedis: Secondary | ICD-10-CM

## 2013-02-28 DIAGNOSIS — N183 Chronic kidney disease, stage 3 unspecified: Secondary | ICD-10-CM | POA: Diagnosis present

## 2013-02-28 DIAGNOSIS — J4489 Other specified chronic obstructive pulmonary disease: Secondary | ICD-10-CM | POA: Diagnosis present

## 2013-02-28 LAB — CMP (CANCER CENTER ONLY)
Albumin: 2.2 g/dL — ABNORMAL LOW (ref 3.3–5.5)
Alkaline Phosphatase: 99 U/L — ABNORMAL HIGH (ref 26–84)
CO2: 30 mEq/L (ref 18–33)
Calcium: 8.5 mg/dL (ref 8.0–10.3)
Chloride: 101 mEq/L (ref 98–108)
Creat: 1.2 mg/dl (ref 0.6–1.2)
Glucose, Bld: 409 mg/dL — ABNORMAL HIGH (ref 73–118)
Potassium: 3.9 mEq/L (ref 3.3–4.7)
Sodium: 136 mEq/L (ref 128–145)
Total Protein: 5.9 g/dL — ABNORMAL LOW (ref 6.4–8.1)

## 2013-02-28 LAB — CBC WITH DIFFERENTIAL (CANCER CENTER ONLY)
BASO#: 0 10*3/uL (ref 0.0–0.2)
BASO%: 0.5 % (ref 0.0–2.0)
EOS%: 2.1 % (ref 0.0–7.0)
HCT: 21.1 % — ABNORMAL LOW (ref 38.7–49.9)
LYMPH%: 16.6 % (ref 14.0–48.0)
MCH: 28.5 pg (ref 28.0–33.4)
MCHC: 32.2 g/dL (ref 32.0–35.9)
MCV: 88 fL (ref 82–98)
MONO#: 0.8 10*3/uL (ref 0.1–0.9)
MONO%: 9.7 % (ref 0.0–13.0)
NEUT%: 71.1 % (ref 40.0–80.0)
RDW: 14.6 % (ref 11.1–15.7)

## 2013-02-28 LAB — IRON AND TIBC CHCC: UIBC: 186 ug/dL (ref 117–376)

## 2013-02-28 LAB — GLUCOSE, CAPILLARY: Glucose-Capillary: 429 mg/dL — ABNORMAL HIGH (ref 70–99)

## 2013-02-28 LAB — BASIC METABOLIC PANEL
BUN: 28 mg/dL — ABNORMAL HIGH (ref 6–23)
CO2: 24 mEq/L (ref 19–32)
Creatinine, Ser: 1.1 mg/dL (ref 0.50–1.35)
GFR calc Af Amer: 79 mL/min — ABNORMAL LOW (ref >90)
GFR calc non Af Amer: 68 mL/min — ABNORMAL LOW (ref >90)
Potassium: 3.6 mEq/L (ref 3.5–5.1)

## 2013-02-28 MED ORDER — NITROGLYCERIN 0.4 MG SL SUBL: 0.4000 mg | SUBLINGUAL_TABLET | SUBLINGUAL | Status: DC | PRN

## 2013-02-28 MED ORDER — INSULIN GLARGINE 100 UNIT/ML ~~LOC~~ SOLN
10.0000 [IU] | Freq: Every day | SUBCUTANEOUS | Status: DC
  Administered 2013-03-01 – 2013-03-02 (×2): 10 [IU] via SUBCUTANEOUS
  Filled 2013-02-28 (×3): qty 0.1

## 2013-02-28 MED ORDER — SODIUM CHLORIDE 0.9 % IJ SOLN
3.0000 mL | Freq: Two times a day (BID) | INTRAMUSCULAR | Status: DC
  Administered 2013-03-01 (×2): 3 mL via INTRAVENOUS

## 2013-02-28 MED ORDER — INSULIN REGULAR HUMAN 100 UNIT/ML IJ SOLN
15.0000 [IU] | Freq: Once | INTRAMUSCULAR | Status: AC
  Administered 2013-02-28: 15 [IU] via SUBCUTANEOUS

## 2013-02-28 MED ORDER — HYDRALAZINE HCL 10 MG PO TABS
10.0000 mg | ORAL_TABLET | Freq: Three times a day (TID) | ORAL | Status: DC
  Administered 2013-03-01 – 2013-03-03 (×9): 10 mg via ORAL
  Filled 2013-02-28 (×11): qty 1

## 2013-02-28 MED ORDER — SODIUM CHLORIDE 0.9 % IV SOLN
1020.0000 mg | Freq: Once | INTRAVENOUS | Status: AC
  Administered 2013-02-28: 1020 mg via INTRAVENOUS
  Filled 2013-02-28: qty 34

## 2013-02-28 MED ORDER — PANTOPRAZOLE SODIUM 40 MG IV SOLR
80.0000 mg | Freq: Once | INTRAVENOUS | Status: AC
  Administered 2013-02-28: 20:00:00 80 mg via INTRAVENOUS

## 2013-02-28 MED ORDER — SODIUM CHLORIDE 0.9 % IV BOLUS (SEPSIS)
1000.0000 mL | Freq: Once | INTRAVENOUS | Status: AC
  Administered 2013-02-28: 1000 mL via INTRAVENOUS

## 2013-02-28 MED ORDER — SODIUM CHLORIDE 0.9 % IV SOLN
INTRAVENOUS | Status: AC
  Administered 2013-03-01: via INTRAVENOUS

## 2013-02-28 MED ORDER — INSULIN ASPART 100 UNIT/ML ~~LOC~~ SOLN
0.0000 [IU] | Freq: Four times a day (QID) | SUBCUTANEOUS | Status: DC
  Administered 2013-03-01: 2 [IU] via SUBCUTANEOUS
  Administered 2013-03-01: 13:00:00 3 [IU] via SUBCUTANEOUS
  Administered 2013-03-01: 06:00:00 2 [IU] via SUBCUTANEOUS
  Administered 2013-03-02: 3 [IU] via SUBCUTANEOUS
  Administered 2013-03-02: 2 [IU] via SUBCUTANEOUS
  Administered 2013-03-02 (×2): 3 [IU] via SUBCUTANEOUS
  Administered 2013-03-03 (×2): 2 [IU] via SUBCUTANEOUS

## 2013-02-28 MED ORDER — ACETAMINOPHEN 650 MG RE SUPP: 650.0000 mg | Freq: Four times a day (QID) | RECTAL | Status: DC | PRN

## 2013-02-28 MED ORDER — INSULIN ASPART 100 UNIT/ML ~~LOC~~ SOLN
0.0000 [IU] | Freq: Every day | SUBCUTANEOUS | Status: DC
  Administered 2013-03-01: 22:00:00 2 [IU] via SUBCUTANEOUS
  Administered 2013-03-02: 3 [IU] via SUBCUTANEOUS

## 2013-02-28 MED ORDER — ACETAMINOPHEN 325 MG PO TABS
650.0000 mg | ORAL_TABLET | Freq: Four times a day (QID) | ORAL | Status: DC | PRN
  Administered 2013-03-02 (×2): 650 mg via ORAL
  Filled 2013-02-28 (×2): qty 2

## 2013-02-28 MED ORDER — ONDANSETRON HCL 4 MG PO TABS: 4.0000 mg | ORAL_TABLET | Freq: Four times a day (QID) | ORAL | Status: DC | PRN

## 2013-02-28 MED ORDER — PANTOPRAZOLE SODIUM 40 MG IV SOLR
40.0000 mg | Freq: Two times a day (BID) | INTRAVENOUS | Status: DC
  Administered 2013-03-01: 40 mg via INTRAVENOUS
  Filled 2013-02-28 (×3): qty 40

## 2013-02-28 MED ORDER — CARVEDILOL 3.125 MG PO TABS
3.1250 mg | ORAL_TABLET | Freq: Two times a day (BID) | ORAL | Status: DC
  Administered 2013-03-01 – 2013-03-03 (×5): 3.125 mg via ORAL
  Filled 2013-02-28 (×7): qty 1

## 2013-02-28 MED ORDER — ATORVASTATIN CALCIUM 80 MG PO TABS
80.0000 mg | ORAL_TABLET | Freq: Every day | ORAL | Status: DC
  Administered 2013-03-01 – 2013-03-03 (×3): 80 mg via ORAL
  Filled 2013-02-28 (×3): qty 1

## 2013-02-28 MED ORDER — FERROUS SULFATE 325 (65 FE) MG PO TABS
325.0000 mg | ORAL_TABLET | Freq: Three times a day (TID) | ORAL | Status: DC
  Administered 2013-03-01: 325 mg via ORAL
  Filled 2013-02-28 (×4): qty 1

## 2013-02-28 MED ORDER — ONDANSETRON HCL 4 MG/2ML IJ SOLN: 4.0000 mg | Freq: Four times a day (QID) | INTRAMUSCULAR | Status: DC | PRN

## 2013-02-28 MED ORDER — PANTOPRAZOLE SODIUM 40 MG IV SOLR
INTRAVENOUS | Status: AC
  Filled 2013-02-28: qty 80

## 2013-02-28 MED ORDER — ASPIRIN 81 MG PO CHEW
81.0000 mg | CHEWABLE_TABLET | Freq: Every day | ORAL | Status: DC
  Filled 2013-02-28: qty 1

## 2013-02-28 NOTE — ED Provider Notes (Signed)
CSN: [id number]     Arrival date & time 02/28/13  1713 History  This chart was scribed for Doug Sou, MD by Blanchard Kelch, ED Scribe. The patient was seen in room MH03/MH03. Patient's care was started at 5:26 PM.      Chief Complaint  Patient presents with  . Hyperglycemia    Patient is a 67 y.o. male presenting with hyperglycemia. The history is provided by the patient. No language interpreter was used.  Hyperglycemia Blood sugar level PTA:  400s Duration:  1 hour Timing:  Constant Progression:  Unchanged Chronicity:  New Context: not change in medication and not noncompliance   Ineffective treatments:  Insulin Associated symptoms: weakness     HPI Comments: DONIVIN WIRT is a 67 y.o. male brought in by ambulance who presents to the Emergency Department complaining of hyperglycemia and low blood counts. Patient has blood drawn at the office earlier today. Hemoglobin was 6.8, consistent with profound anemia He states that he was being seen by Dr. Myna Hidalgo getting an iron injection and had a blood glucose reading in the 400s as well as low blood counts so he was sent to the ED. His glucose reading is currently 429 here in the ED. He states he had prostate surgery a month ago but has not noticed any bleeding. He denies melena. He states he has had cosntant weakness for three days. Nothing makes symptoms better or worse. He denies chest pain denies abdominal pain denies blood per rectum. He denies noncompliance with medications. He was in the hospital recently and was discharged four days ago, but states he was feeling well after being discharged until the weakness began. He has a past medical history of anemia, prostate cancer, and diabetes. He reports that he has been taking his medications and insulin compliantly and has not missed any doses. He denies smoking, alcohol or drug use.   Past Medical History  Diagnosis Date  . Hypertension   . High cholesterol   . GERD (gastroesophageal  reflux disease)   . GI bleed     a. Recurrent GI bleed, tx with IV iron. b. Per heme notes - likely AVMs 04/2012 (tx with cauterization several months ago).  . Orthostatic hypotension     a. Tx with florinef.  . Hematuria     a. Urology note scan from 07/2012: cystoscopy without evidence for bladder lesion, only lateral hypertrophy of posterior urethra, bladder impression from BPH. b. Pt states he had "some tests" scheduled for later in July 2014.  . Stage III chronic kidney disease     a. Stage 3 (DM with complications ->CKD, peripheral neuropathy).  . Anemia, iron deficiency 03/24/2011    a. Recurrent GI bleed, tx with periodic iron infusions.  . 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 Sleep apnea     "had mask; couldn't sleep in it" (09/20/2012)  . Type II diabetes mellitus     a. Dx 1994, uncontrolled.   . Diabetic peripheral neuropathy 2014    foot pain.  . Chronic lower back pain   . Fatty liver     on CT of 11/2010  . AVM (arteriovenous malformation) of duodenum, acquired     egds in 01/2012, 04/2011  . Polyp, colonic     Colonoscopy 01/2012 "benign" polyp  . CAD (coronary artery disease)     a. Cath 09/2012: moderate borderline CAD in mid LAD/small diagonal branch, mild RCA stenosis, to be managed medically   . BPH (benign prostatic hyperplasia)   .  Prostate cancer   . Stroke   . Peripheral neuropathy    Past Surgical History  Procedure Laterality Date  . Shoulder open rotator cuff repair Left 1980's  . Inguinal hernia repair Right 2011  . Lumbar disc surgery  1980's  . Robot assisted laparoscopic radical prostatectomy N/A 01/19/2013    Procedure: ROBOTIC ASSISTED LAPAROSCOPIC RADICAL PROSTATECTOMY;  Surgeon: Valetta Fuller, MD;  Location: [facility];  Service: Urology;  Laterality: N/A;  . Lymphadenectomy Bilateral 01/19/2013    Procedure: LYMPHADENECTOMY;  Surgeon: Valetta Fuller, MD;  Location: [facility];  Service: Urology;  Laterality: Bilateral;  . Prostatectomy     Family History   Problem Relation Age of Onset  . Heart attack Brother     Died at 74  . Stroke Father     Died at 53  . Diabetes Sister   . Diabetes Mother   . Stomach cancer Brother   . Heart attack Sister    History  Substance Use Topics  . Smoking status: Former Smoker -- 1.00 packs/day for 48 years    Types: Cigarettes    Quit date: 03/17/2012  . Smokeless tobacco: Never Used  . Alcohol Use: No     Comment: 09/20/2012 "Used to; stopped ~ 2009; never had problem w/it"    Review of Systems  HENT: Negative.   Respiratory: Negative.   Cardiovascular: Negative.   Gastrointestinal: Negative.  Negative for blood in stool.  Musculoskeletal: Negative.   Skin: Negative.   Neurological: Positive for weakness.  Psychiatric/Behavioral: Negative.   All other systems reviewed and are negative.    Allergies  Review of patient's allergies indicates no known allergies.  Home Medications   Current Outpatient Rx  Name  Route  Sig  Dispense  Refill  . aspirin 81 MG chewable tablet   Oral   Chew by mouth daily.         [name] Kitchen atorvastatin (LIPITOR) 80 MG tablet      TAKE 1 TABLET EVERY DAY   30 tablet   2   . carvedilol (COREG) 3.125 MG tablet   Oral   Take 3.125 mg by mouth 2 (two) times daily with a meal.         . ferrous sulfate 325 (65 FE) MG tablet   Oral   Take 1 tablet (325 mg total) by mouth 3 (three) times daily with meals.   90 tablet   1   . fluticasone (FLONASE) 50 MCG/ACT nasal spray   Nasal   Place 2 sprays into the nose as needed.          . gabapentin (NEURONTIN) 300 MG capsule   Oral   Take 300 mg by mouth at bedtime.         [name] Kitchen guaiFENesin (MUCINEX) 600 MG 12 hr tablet   Oral   Take 1 tablet (600 mg total) by mouth 2 (two) times daily.   14 tablet   0   . hydrALAZINE (APRESOLINE) 10 MG tablet   Oral   Take 1 tablet (10 mg total) by mouth every 8 (eight) hours.   90 tablet   1   . insulin glargine (LANTUS) 100 UNIT/ML injection   Subcutaneous    Inject 10 Units into the skin at bedtime.          . insulin lispro (HUMALOG) 100 UNIT/ML injection   Subcutaneous   Inject 3 Units into the skin 3 (three) times daily before meals.          [name] Kitchen  lisinopril (PRINIVIL,ZESTRIL) 40 MG tablet   Oral   Take 40 mg by mouth every morning.         . nitroGLYCERIN (NITROSTAT) 0.4 MG SL tablet   Sublingual   Place 1 tablet (0.4 mg total) under the tongue every 5 (five) minutes as needed for chest pain (up to 3 doses).   25 tablet   2     IMPORTANT: Do not take this medication if you have ...   . pantoprazole (PROTONIX) 40 MG tablet   Oral   Take 1 tablet (40 mg total) by mouth daily.   30 tablet   3   . polyvinyl alcohol (LIQUIFILM TEARS) 1.4 % ophthalmic solution   Both Eyes   Place 1 drop into both eyes daily as needed (dry eyes).          Triage Vitals: BP 143/63  Pulse 93  Temp(Src) 97.4 F (36.3 C) (Oral)  Resp 20  Ht 5\' 7"  (1.702 m)  Wt 159 lb (72.122 kg)  BMI 24.90 kg/m2  SpO2 100%  Physical Exam  Nursing note and vitals reviewed. Constitutional: He appears well-developed and well-nourished.  HENT:  Head: Normocephalic and atraumatic.  Eyes: Conjunctivae are normal. Pupils are equal, round, and reactive to light.  Neck: Neck supple. No tracheal deviation present. No thyromegaly present.  Cardiovascular: Normal rate and regular rhythm.   No murmur heard. Pulmonary/Chest: Effort normal and breath sounds normal.  Abdominal: Soft. Bowel sounds are normal. He exhibits no distension. There is no tenderness.  Genitourinary: Penis normal. Guaiac positive stool.  Rectal normal tone no gross blood nontender  Musculoskeletal: Normal range of motion. He exhibits no edema and no tenderness.  Neurological: He is alert. Coordination normal.  Skin: Skin is warm and dry. No rash noted.  Psychiatric: He has a normal mood and affect.    ED Course  Procedures (including critical care time)  DIAGNOSTIC STUDIES: Oxygen  Saturation is 100% on room air, normal by my interpretation.    COORDINATION OF CARE: 5:26 PM - Patient verbalizes understanding and agrees with treatment plan.    Labs Review Labs Reviewed  GLUCOSE, CAPILLARY - Abnormal; Notable for the following:    Glucose-Capillary 429 (*)    All other components within normal limits  BASIC METABOLIC PANEL - Abnormal; Notable for the following:    Glucose, Bld 418 (*)    BUN 28 (*)    GFR calc non Af Amer 68 (*)    GFR calc Af Amer 79 (*)    All other components within normal limits  OCCULT BLOOD X 1 CARD TO LAB, STOOL - Abnormal; Notable for the following:    Fecal Occult Bld POSITIVE (*)    All other components within normal limits  GLUCOSE, CAPILLARY - Abnormal; Notable for the following:    Glucose-Capillary 302 (*)    All other components within normal limits   Imaging Review No results found. Results for orders placed during the hospital encounter of 02/28/13  GLUCOSE, CAPILLARY      Result Value Range   Glucose-Capillary 429 (*) 70 - 99 mg/dL   Comment 1 Documented in Chart    BASIC METABOLIC PANEL      Result Value Range   Sodium 135  135 - 145 mEq/L   Potassium 3.6  3.5 - 5.1 mEq/L   Chloride 102  96 - 112 mEq/L   CO2 24  19 - 32 mEq/L   Glucose, Bld 418 (*) 70 - 99  mg/dL   BUN 28 (*) 6 - 23 mg/dL   Creatinine, Ser 4.69  0.50 - 1.35 mg/dL   Calcium 8.7  8.4 - 62.9 mg/dL   GFR calc non Af Amer 68 (*) >90 mL/min   GFR calc Af Amer 79 (*) >90 mL/min  OCCULT BLOOD X 1 CARD TO LAB, STOOL      Result Value Range   Fecal Occult Bld POSITIVE (*) NEGATIVE  GLUCOSE, CAPILLARY      Result Value Range   Glucose-Capillary 302 (*) 70 - 99 mg/dL   Dg Chest 2 View  52/10/4130   CLINICAL DATA:  Dizziness, cough.  History of prostate cancer.  EXAM: CHEST  2 VIEW  COMPARISON:  Chest radiograph December 03, 2012  FINDINGS: Cardiomediastinal silhouette is unremarkable. The lungs are clear without pleural effusions or focal consolidations.  Pulmonary vasculature is unremarkable. Trachea projects midline and there is no pneumothorax. Soft tissue planes and included osseous structures are nonsuspicious. Mild degenerative change of the thoracic spine.  IMPRESSION: No active cardiopulmonary disease.   Electronically Signed   By: Awilda Metro   On: 02/22/2013 01:20   Ct Angio Chest Pe W/cm &/or Wo Cm  02/22/2013   CLINICAL DATA:  Cough, lightheaded headedness, recent surgery for prostate cancer.  EXAM: CT ANGIOGRAPHY CHEST WITH CONTRAST  TECHNIQUE: Multidetector CT imaging of the chest was performed using the standard protocol during bolus administration of intravenous contrast. Multiplanar CT image reconstructions including MIPs were obtained to evaluate the vascular anatomy.  CONTRAST:  80mL OMNIPAQUE IOHEXOL 350 MG/ML SOLN  COMPARISON:  Chest radiograph February 22, 2013 and CT of the chest November 03, 2012.  FINDINGS: Mildly delayed bolus timing. Main pulmonary artery is not enlarged. No pulmonary arterial filling defects to the level of the subsegmental arteries. Mild contrast admixture in the lower lobe segment segmental arteries.  Minimal centrilobular emphysema. No solid pulmonary nodules or masses. No focal consolidations or pleural effusions. 3 mm right lower lobe, posterior segment ground-glass nodule is below the level for surveillance recommendations . Mild bronchial wall thickening, most notable in left lower lobe. Tracheobronchial tree is patent and midline, no pneumothorax.  Thoracic aorta is normal in course and caliber, with mild calcific atherosclerosis. The heart and pericardium are unremarkable. No lymphadenopathy by CT size criteria. Thoracic esophagus is nonsuspicious. Included view of the abdomen is unremarkable. Very mild gynecomastia. Mild degenerative change of the included right shoulder. Osseous structures are nonsuspicious.  Review of the MIP images confirms the above findings.  IMPRESSION: No pulmonary arterial embolism.   Minimal bronchial wall thickening may reflect bronchitis, most notable in the left lung base. 3 mm right lower lobe sub solid pulmonary nodule could be reactive.   Electronically Signed   By: Awilda Metro   On: 02/22/2013 01:32    EKG Interpretation   None       MDM  No diagnosis found. Spoke with Dr.Patel plan transferred [facility] for admission to medical surgical bed.  Diagnosis #1 gastrointestinal bleed  #2 symptomatic anemia #3 hyperglycemia   I personally performed the services described in this documentation, which was scribed in my presence. The recorded information has been reviewed and considered.   Doug Sou, MD 02/28/13 2003

## 2013-02-28 NOTE — Progress Notes (Signed)
Hematology and Oncology Follow Up Visit  [name] [id number] 08/20/1945 67 y.o. 02/28/2013   Principle Diagnosis:   recurrent iron deficiency anemia.  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  He needs to be admitted for recurrent GI bleed.  Hgb is 6.8.  Glucose is 409.  Medications: Current outpatient prescriptions:aspirin 81 MG chewable tablet, Chew by mouth daily., Disp: , Rfl: ;  atorvastatin (LIPITOR) 80 MG tablet, TAKE 1 TABLET EVERY DAY, Disp: 30 tablet, Rfl: 2;  carvedilol (COREG) 3.125 MG tablet, Take 3.125 mg by mouth 2 (two) times daily with a meal., Disp: , Rfl: ;  ferrous sulfate 325 (65 FE) MG tablet, Take 1 tablet (325 mg total) by mouth 3 (three) times daily with meals., Disp: 90 tablet, Rfl: 1 fluticasone (FLONASE) 50 MCG/ACT nasal spray, Place 2 sprays into the nose as needed. , Disp: , Rfl: ;  gabapentin (NEURONTIN) 300 MG capsule, Take 300 mg by mouth at bedtime., Disp: , Rfl: ;  guaiFENesin (MUCINEX) 600 MG 12 hr tablet, Take 1 tablet (600 mg total) by mouth 2 (two) times daily., Disp: 14 tablet, Rfl: 0;  hydrALAZINE (APRESOLINE) 10 MG tablet, Take 1 tablet (10 mg total) by mouth every 8 (eight) hours., Disp: 90 tablet, Rfl: 1 insulin glargine (LANTUS) 100 UNIT/ML injection, Inject 10 Units into the skin at bedtime. , Disp: , Rfl: ;  insulin lispro (HUMALOG) 100 UNIT/ML injection, Inject 3 Units into the skin 3 (three) times daily before meals. , Disp: , Rfl: ;  lisinopril (PRINIVIL,ZESTRIL) 40 MG tablet, Take 40 mg by mouth every morning., Disp: , Rfl:  nitroGLYCERIN (NITROSTAT) 0.4 MG SL tablet, Place 1 tablet (0.4 mg total) under the tongue every 5 (five) minutes as needed for chest pain (up to 3 doses)., Disp: 25 tablet, Rfl: 2;  pantoprazole (PROTONIX) 40 MG tablet, Take 1 tablet (40 mg total) by mouth daily.,  Disp: 30 tablet, Rfl: 3;  polyvinyl alcohol (LIQUIFILM TEARS) 1.4 % ophthalmic solution, Place 1 drop into both eyes daily as needed (dry eyes)., Disp: , Rfl:   Allergies: No Known Allergies  Past Medical History, Surgical history, Social history, and Family History were reviewed and updated.  Review of Systems: As above  Physical Exam:  height is 5\' 7"  (1.702 m) and weight is 159 lb (72.122 kg). His oral temperature is 98.2 F (36.8 C). His blood pressure is 100/54 and his pulse is 100. His respiration is 18.   See my dictated note  Lab Results  Component Value Date   WBC 8.7 02/28/2013   HGB 6.8* 02/28/2013   HCT 21.1* 02/28/2013   MCV 88 02/28/2013   PLT 285 02/28/2013     Chemistry      Component Value Date/Time   NA 136 02/28/2013 1324   NA 136 02/24/2013 0501   K 3.9 02/28/2013 1324   K 3.9 02/24/2013 0501   CL 101 02/28/2013 1324   CL 104 02/24/2013 0501   CO2 30 02/28/2013 1324   CO2 21 02/24/2013 0501   BUN 24* 02/28/2013 1324   BUN 11 02/24/2013 0501   CREATININE 1.2 02/28/2013 1324   CREATININE 1.17 02/24/2013 0501      Component Value Date/Time   CALCIUM 8.5 02/28/2013 1324   CALCIUM 8.4 02/24/2013 0501  ALKPHOS 99* 02/28/2013 1324   ALKPHOS 121* 02/22/2013 1245   AST 11 02/28/2013 1324   AST 9 02/22/2013 1245   ALT 15 02/28/2013 1324   ALT 7 02/22/2013 1245   BILITOT 0.50 02/28/2013 1324   BILITOT 0.3 02/22/2013 1245         Impression and Plan: Mr. . [name] is beign admitted.  He MUST get GI to scope again.  He MUST get his blood sugars better.     [name], MD 12/15/20145:25 PM

## 2013-02-28 NOTE — Telephone Encounter (Signed)
Syringes refilled per protocol. JG//CMA

## 2013-02-28 NOTE — Patient Instructions (Signed)

## 2013-02-28 NOTE — ED Notes (Addendum)
MD at bedside. 

## 2013-02-28 NOTE — ED Notes (Signed)
Pt reports was at clinic receiving iron injection.  Hyperglycemic in the 400s and unable to lower despite 50 units sq insulin.  Has been having intermittent dizziness.

## 2013-02-28 NOTE — Telephone Encounter (Signed)
Please arrange hospital follow up in 2-3 weeks.

## 2013-02-28 NOTE — ED Notes (Signed)
MD at bedside. 

## 2013-02-28 NOTE — H&P (Signed)
@LOGO @ [facility] History and Physical  Patient: [name]  HYQ:[id number]  DOB: 15-May-1945  DOS: the patient was seen and examined on 02/28/2013 PCP: Lemont Fillers., NP  Chief Complaint: Dizziness  HPI: [name] is a 67 y.o. male with Past medical history of hypertension, GERD, GI bleed with AVM, orthostatic hypotension, COPD, iron deficiency anemia. The patient is coming from home. The patient was seen by his hematologist for iron infusion at which time he was found to be hyperglycemic and also had very low hemoglobin of 6.8. He mentions that since last to 3 weeks he has been having shortness of breath which was associated with dizziness which was progressively worsening. Patient denies any complaint of chest pain, palpitation, orthopnea, PND, cough, fever, chills, diarrhea, melena, active bleeding, blood in the urine. He mentions that he has recently started taking Neurontin 1 month ago for neuropathy. He denies any abdominal pain nausea or vomiting. He also denies any over the counter supplements or recent upper respiratory infection  Review of Systems: as mentioned in the history of present illness.  A Comprehensive review of the other systems is negative.  Past Medical History  Diagnosis Date  . Hypertension   . High cholesterol   . GERD (gastroesophageal reflux disease)   . GI bleed     a. Recurrent GI bleed, tx with IV iron. b. Per heme notes - likely AVMs 04/2012 (tx with cauterization several months ago).  . Orthostatic hypotension     a. Tx with florinef.  . Hematuria     a. Urology note scan from 07/2012: cystoscopy without evidence for bladder lesion, only lateral hypertrophy of posterior urethra, bladder impression from BPH. b. Pt states he had "some tests" scheduled for later in July 2014.  . Stage III chronic kidney disease     a. Stage 3 (DM with complications ->CKD, peripheral neuropathy).  . Anemia, iron deficiency 03/24/2011    a. Recurrent GI  bleed, tx with periodic iron infusions.  . 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 Sleep apnea     "had mask; couldn't sleep in it" (09/20/2012)  . Type II diabetes mellitus     a. Dx 1994, uncontrolled.   . Diabetic peripheral neuropathy 2014    foot pain.  . Chronic lower back pain   . Fatty liver     on CT of 11/2010  . AVM (arteriovenous malformation) of duodenum, acquired     egds in 01/2012, 04/2011  . Polyp, colonic     Colonoscopy 01/2012 "benign" polyp  . CAD (coronary artery disease)     a. Cath 09/2012: moderate borderline CAD in mid LAD/small diagonal branch, mild RCA stenosis, to be managed medically   . BPH (benign prostatic hyperplasia)   . Prostate cancer   . Stroke   . Peripheral neuropathy    Past Surgical History  Procedure Laterality Date  . Shoulder open rotator cuff repair Left 1980's  . Inguinal hernia repair Right 2011  . Lumbar disc surgery  1980's  . Robot assisted laparoscopic radical prostatectomy N/A 01/19/2013    Procedure: ROBOTIC ASSISTED LAPAROSCOPIC RADICAL PROSTATECTOMY;  Surgeon: Valetta Fuller, MD;  Location: [facility];  Service: Urology;  Laterality: N/A;  . Lymphadenectomy Bilateral 01/19/2013    Procedure: LYMPHADENECTOMY;  Surgeon: Valetta Fuller, MD;  Location: [facility];  Service: Urology;  Laterality: Bilateral;  . Prostatectomy     Social History:  reports that he quit smoking about a year ago. His smoking  use included Cigarettes. He has a 48 pack-year smoking history. He has never used smokeless tobacco. He reports that he does not drink alcohol or use illicit drugs. Independent for most of his  ADL.  No Known Allergies  Family History  Problem Relation Age of Onset  . Heart attack Brother     Died at 79  . Stroke Father     Died at 21  . Diabetes Sister   . Diabetes Mother   . Stomach cancer Brother   . Heart attack Sister     Prior to Admission medications   Medication Sig Start Date End Date Taking? Authorizing Provider  aspirin 81  MG chewable tablet Chew by mouth daily.   Yes Historical Provider, MD  atorvastatin (LIPITOR) 80 MG tablet Take 80 mg by mouth daily.   Yes Historical Provider, MD  carvedilol (COREG) 3.125 MG tablet Take 3.125 mg by mouth 2 (two) times daily with a meal.   Yes Historical Provider, MD  fluticasone (FLONASE) 50 MCG/ACT nasal spray Place 2 sprays into the nose daily as needed for allergies.    Yes Historical Provider, MD  gabapentin (NEURONTIN) 300 MG capsule Take 300 mg by mouth at bedtime.   Yes Historical Provider, MD  guaiFENesin (MUCINEX) 600 MG 12 hr tablet Take 1 tablet (600 mg total) by mouth 2 (two) times daily. 02/22/13  Yes April K Palumbo-Rasch, MD  hydrALAZINE (APRESOLINE) 10 MG tablet Take 1 tablet (10 mg total) by mouth every 8 (eight) hours. 02/24/13  Yes Dorothea Ogle, MD  insulin glargine (LANTUS) 100 UNIT/ML injection Inject 10 Units into the skin at bedtime.    Yes Historical Provider, MD  insulin lispro (HUMALOG) 100 UNIT/ML injection Inject 3 Units into the skin 3 (three) times daily before meals.    Yes Historical Provider, MD  lisinopril (PRINIVIL,ZESTRIL) 40 MG tablet Take 40 mg by mouth every morning.   Yes Historical Provider, MD  nitroGLYCERIN (NITROSTAT) 0.4 MG SL tablet Place 1 tablet (0.4 mg total) under the tongue every 5 (five) minutes as needed for chest pain (up to 3 doses). 09/22/12  Yes Dayna N Dunn, PA-C  pantoprazole (PROTONIX) 40 MG tablet Take 1 tablet (40 mg total) by mouth daily. 01/17/13  Yes Sandford Craze, NP  polyvinyl alcohol (LIQUIFILM TEARS) 1.4 % ophthalmic solution Place 1 drop into both eyes daily as needed (dry eyes).   Yes Historical Provider, MD    Physical Exam: Filed Vitals:   02/28/13 1720 02/28/13 1932 02/28/13 2047 02/28/13 2207  BP: 143/63 145/76 147/60 143/75  Pulse: 93 86 96 104  Temp: 97.4 F (36.3 C)  97.9 F (36.6 C) 98 F (36.7 C)  TempSrc: Oral  Oral Oral  Resp: 20 16  18   Height: 5\' 7"  (1.702 m)   5\' 7"  (1.702 m)  Weight:  72.122 kg (159 lb)   71.5 kg (157 lb 10.1 oz)  SpO2: 100% 100% 100% 100%    General: Alert, Awake and Oriented to Time, Place and Person. Appear in mild distress Eyes: PERRL ENT: Oral Mucosa clear moist. Neck: no JVD Cardiovascular: S1 and S2 Present, no Murmur, Peripheral Pulses Present Respiratory: Bilateral Air entry equal and Decreased, Clear to Auscultation,  no Crackles,no wheezes Abdomen: Bowel Sound Present, Soft and Non tender Skin: no Rash Extremities: no Pedal edema, no calf tenderness Neurologic: Grossly Unremarkable.  Labs on Admission:  CBC:  Recent Labs Lab 02/22/13 0033 02/22/13 1245 02/23/13 0543 02/24/13 0501 02/28/13 1324  WBC 6.1  4.6 5.6 8.0 8.7  NEUTROABS 3.8 3.2  --   --  6.2  HGB 7.0* 7.0* 9.3* 9.7* 6.8*  HCT 20.9* 20.6* 26.8* 28.6* 21.1*  MCV 85.3 83.1 81.5 82.4 88  PLT 340 329 286 309 285    CMP     Component Value Date/Time   NA 135 02/28/2013 1755   NA 136 02/28/2013 1324   K 3.6 02/28/2013 1755   K 3.9 02/28/2013 1324   CL 102 02/28/2013 1755   CL 101 02/28/2013 1324   CO2 24 02/28/2013 1755   CO2 30 02/28/2013 1324   GLUCOSE 418* 02/28/2013 1755   GLUCOSE 409* 02/28/2013 1324   BUN 28* 02/28/2013 1755   BUN 24* 02/28/2013 1324   CREATININE 1.10 02/28/2013 1755   CREATININE 1.2 02/28/2013 1324   CALCIUM 8.7 02/28/2013 1755   CALCIUM 8.5 02/28/2013 1324   PROT 5.9* 02/28/2013 1324   PROT 6.2 02/22/2013 1245   ALBUMIN 2.5* 02/22/2013 1245   AST 11 02/28/2013 1324   AST 9 02/22/2013 1245   ALT 15 02/28/2013 1324   ALT 7 02/22/2013 1245   ALKPHOS 99* 02/28/2013 1324   ALKPHOS 121* 02/22/2013 1245   BILITOT 0.50 02/28/2013 1324   BILITOT 0.3 02/22/2013 1245   GFRNONAA 68* 02/28/2013 1755   GFRAA 79* 02/28/2013 1755    No results found for this basename: LIPASE, AMYLASE,  in the last 168 hours No results found for this basename: AMMONIA,  in the last 168 hours   Recent Labs Lab 02/22/13 0033  TROPONINI <0.30   BNP (last 3  results) No results found for this basename: PROBNP,  in the last 8760 hours  Radiological Exams on Admission: No results found.   Assessment/Plan Principal Problem:   GI bleed Active Problems:   Anemia, iron deficiency   Diabetes mellitus type 2, uncontrolled   Hyperlipidemia   Diabetic neuropathy   CAD (coronary artery disease)   1. GI bleed The patient is presenting with Hemoccult-positive anemia which is symptomatic. He was seen by his hematologist today and has received iron supplementation IV. Currently his hemoglobin is 6.8 he is hemodynamically stable. I will give him one unit of blood transfusion and follow his serial H&H after that. He will be kept n.p.o. after midnight for for possible procedure. Since he is hemodynamically stable I would consult GI in the morning. I will give him IV Protonix bolus and him on Protonix 40 mg every 12 hour Continue him on iron supplementation  2. Diabetes mellitus Placing him on sliding scale and holding one dose of Lantus tonight  3. Neuropathy Holding Neurontin at present in view of anemia  4. Coronary artery disease Continue aspirin in view of his history of CAD and CVA Also continuing Coreg and Lipitor  Consults: Gastroenterology in morning  DVT Prophylaxis: mechanical compression device Nutrition: N.p.o.  Code Status: Full  Disposition: Admitted to inpatient in telemetry unit.  Author: Lynden Oxford, MD [facility] Pager: [phone] 02/28/2013, 11:25 PM    If 7PM-7AM, please contact night-coverage [email or web address] Password [email or web address]

## 2013-03-01 ENCOUNTER — Encounter (HOSPITAL_COMMUNITY)
Admission: EM | Disposition: A | Discharge status: Home / Self Care | Payer: Medicare Other | Source: Home / Self Care | Attending: Internal Medicine

## 2013-03-01 ENCOUNTER — Encounter (HOSPITAL_COMMUNITY): Payer: Self-pay | Admitting: Physician Assistant

## 2013-03-01 DIAGNOSIS — D509 Iron deficiency anemia, unspecified: Secondary | ICD-10-CM

## 2013-03-01 DIAGNOSIS — K5521 Angiodysplasia of colon with hemorrhage: Secondary | ICD-10-CM

## 2013-03-01 HISTORY — PX: ENTEROSCOPY: SHX5533

## 2013-03-01 HISTORY — DX: Angiodysplasia of colon with hemorrhage: K55.21

## 2013-03-01 LAB — COMPREHENSIVE METABOLIC PANEL
AST: 11 U/L (ref 0–37)
Albumin: 2.1 g/dL — ABNORMAL LOW (ref 3.5–5.2)
Alkaline Phosphatase: 81 U/L (ref 39–117)
BUN: 19 mg/dL (ref 6–23)
CO2: 22 mEq/L (ref 19–32)
Calcium: 8 mg/dL — ABNORMAL LOW (ref 8.4–10.5)
Creatinine, Ser: 1.03 mg/dL (ref 0.50–1.35)
GFR calc non Af Amer: 74 mL/min — ABNORMAL LOW (ref >90)
Sodium: 138 mEq/L (ref 135–145)
Total Protein: 5 g/dL — ABNORMAL LOW (ref 6.0–8.3)

## 2013-03-01 LAB — CBC
HCT: 20.1 % — ABNORMAL LOW (ref 39.0–52.0)
Hemoglobin: 6.9 g/dL — CL (ref 13.0–17.0)
MCH: 29.9 pg (ref 26.0–34.0)
MCHC: 34.3 g/dL (ref 30.0–36.0)
RBC: 2.31 MIL/uL — ABNORMAL LOW (ref 4.22–5.81)

## 2013-03-01 LAB — PREPARE RBC (CROSSMATCH)

## 2013-03-01 LAB — PROTIME-INR: Prothrombin Time: 12.9 seconds (ref 11.6–15.2)

## 2013-03-01 LAB — ABO/RH: ABO/RH(D): AB POS

## 2013-03-01 LAB — GLUCOSE, CAPILLARY
Glucose-Capillary: 175 mg/dL — ABNORMAL HIGH (ref 70–99)
Glucose-Capillary: 200 mg/dL — ABNORMAL HIGH (ref 70–99)
Glucose-Capillary: 249 mg/dL — ABNORMAL HIGH (ref 70–99)

## 2013-03-01 LAB — WHOLE BLOOD GLUCOSE - CHCC SATELLITE: Glucose: 422 mg/dL — ABNORMAL HIGH (ref 70–99)

## 2013-03-01 SURGERY — ENTEROSCOPY
Anesthesia: Moderate Sedation

## 2013-03-01 MED ORDER — MENTHOL 3 MG MT LOZG
1.0000 | LOZENGE | OROMUCOSAL | Status: DC | PRN
  Filled 2013-03-01: qty 9

## 2013-03-01 MED ORDER — MIDAZOLAM HCL 10 MG/2ML IJ SOLN
INTRAMUSCULAR | Status: DC | PRN
  Administered 2013-03-01 (×2): 2 mg via INTRAVENOUS
  Administered 2013-03-01 (×2): 1 mg via INTRAVENOUS
  Administered 2013-03-01: 2 mg via INTRAVENOUS

## 2013-03-01 MED ORDER — BUTAMBEN-TETRACAINE-BENZOCAINE 2-2-14 % EX AERO
INHALATION_SPRAY | CUTANEOUS | Status: DC | PRN
  Administered 2013-03-01: 2 via TOPICAL

## 2013-03-01 MED ORDER — PANTOPRAZOLE SODIUM 40 MG PO TBEC
40.0000 mg | DELAYED_RELEASE_TABLET | Freq: Every day | ORAL | Status: DC
  Administered 2013-03-02 – 2013-03-03 (×2): 40 mg via ORAL
  Filled 2013-03-01 (×2): qty 1

## 2013-03-01 MED ORDER — HYDRALAZINE HCL 20 MG/ML IJ SOLN
5.0000 mg | Freq: Once | INTRAMUSCULAR | Status: AC
  Administered 2013-03-02: 5 mg via INTRAVENOUS
  Filled 2013-03-01: qty 1

## 2013-03-01 MED ORDER — FENTANYL CITRATE 0.05 MG/ML IJ SOLN
INTRAMUSCULAR | Status: DC | PRN
  Administered 2013-03-01 (×3): 25 ug via INTRAVENOUS

## 2013-03-01 MED ORDER — MIDAZOLAM HCL 5 MG/ML IJ SOLN
INTRAMUSCULAR | Status: AC
  Filled 2013-03-01: qty 2

## 2013-03-01 MED ORDER — SODIUM CHLORIDE 0.9 % IV SOLN
INTRAVENOUS | Status: AC
  Administered 2013-03-01: via INTRAVENOUS

## 2013-03-01 MED ORDER — FENTANYL CITRATE 0.05 MG/ML IJ SOLN
INTRAMUSCULAR | Status: AC
  Filled 2013-03-01: qty 2

## 2013-03-01 NOTE — Progress Notes (Signed)
Patient noted to have coarse and crackle breath sounds s/p Enteroscopy. Oxygen sats 88-90% on 4L O2 via [place], BP/HR WNL and wet cough. GI MD notified, then attending MD notified. Instructed to monitor patient and to stop IVF and SLIV. Patient now on 2L with sats at 95%, lung sounds clearer with dry cough. Patient being transported back to the floor.

## 2013-03-01 NOTE — Op Note (Addendum)
[facility] [address] [place], [address]   OPERATIVE PROCEDURE REPORT  PATIENT: [name], [name]  MR#: [id number] BIRTHDATE: 1945/12/24 , 67  yrs. old GENDER: Male ENDOSCOPIST: Louis Meckel, MD REFERRED BY: PROCEDURE DATE: 03/01/2013 PROCEDURE:   Small bowel enteroscopy with control of bleeding ASA CLASS:   Class II INDICATIONS:1.  hemoccult positive stools.   2.  melenic bleeding. MEDICATIONS: These medications were titrated to patient response per physician's verbal order, Versed 10 mg IV, and Fentanyl 75 mcg IV  TOPICAL ANESTHETIC:   Cetacaine Spray  DESCRIPTION OF PROCEDURE:   After the risks benefits and alternatives of the procedure were thoroughly explained, informed consent was obtained.  The [id number] ([id number])  endoscope was introduced through the mouth  and advanced to the proximal jejunum jejunum , limited by Without limitations.   The instrument was slowly withdrawn as the mucosa was fully examined.    An   There were multiple (at least 6) AVMs in the proximal jejunum, second and third portions of the duodenum.  Self breast blood throughout this area.  AVMs measured 1-2 mm.  Each was cauterized utilizing the argon plasma coagulator.  Lots of blood were seen in the duodenal bulb but no frank AVMs were identified.  The exam was otherwise unremarkable including examination of the stomach and esophagus. Retroflexed views revealed no abnormalities.    The scope was then withdrawn from the patient and the procedure terminated.  COMPLICATIONS: There were no complications. ENDOSCOPIC IMPRESSION: m multiple bleeding AVMs in the duodenum and jejunum-status post cauterization with the argon plasma coagulator     RECOMMENDATIONS: Continue iron supplementation REPEAT EXAM:  _______________________________ eSignedLouis Meckel, MD 03/01/2013 4:25 PM   CC:  PATIENT NAME:  [name] MR#: [id number]

## 2013-03-01 NOTE — Telephone Encounter (Signed)
Patient returned phone call and states that he is still in the hospital and doesn't know when he will be discharged. I told him to call our office once he is discharged to schedule a hospital follow up. Also, patient states that the hospital physician wants to run some GI testing on him and wants to discuss this with Melissa.

## 2013-03-01 NOTE — Consult Note (Signed)
[facility] Consult: 9:51 AM 03/01/2013  LOS: 1 day    Referring Provider: Dr York Spaniel Primary Care Physician:  Lemont Fillers., NP Primary Gastroenterologist:  Dr. Christella Hartigan    Reason for Consultation:  Anemia   HPI: [name] is a 67 y.o. male past medical history significant for HTN, HLD, CKD III, DM2 uncontrolled with complications, CVA, prostate cancer s/p robotic-assisted laparoscopic radical retropubic prostatectomy with bilateral pelvic lymph node dissection 01/19/2013. Chronic anemia followed by Dr Myna Hidalgo and undergoes periodic parenteral iron infusions for at least 3 years. No epogen etc given.  GI consult on 02/22/13 for symptomatic anemia, hgb of 7.0 (9.9 on 01/20/13). Was FOB negative then. Transfused with 2 units of RBCs. Ferritin was 15 (c/w 180 in 10/2012), iron (81 10/2012).  b12 and folate normal.  Takes daily Protonix and ASA.   Hx of repeated EGD and colonoscopy at [facility].  Last was in 2014 vs 2013.  Also has had capsule endoscopy there.  Pt states hx of cauterizion of AVMs in [place] on prior occasions the last was within the past 12 months. [facility] told him the next step would be referral to [facility] for double balloon enteroscopy if he had recurrent anemia.  Plan was for pt to follow up with Dr Christella Hartigan. This is set for 04/06/13.  hgb of 9.7 on 02/24/13.  He was started by hospitalist on po Iron, Dr Myna Hidalgo has never prescribed po iron.    Went to hematologist, Dr Myna Hidalgo,  12/15 for Iron infusion and had glucose of 409 and low hgb. + 3 weeks of SOB/DOE,  Sent to ED for admission. He is receiving 2nd of 2 ordered PRBCS at present. .  His only other transfusion was about 10 days ago at [facility].   He is FOB + today, as he was in July 2014.   No nausea, no blood per rectum.  Stools  dark as a result of taking to po Iron which is constipating, stool softeners not helping this problem.   Initial BUN 24 and 28 but normal today.   We have no GI records from [facility] yet.      Past Medical History  Diagnosis Date  . Hypertension   . High cholesterol   . GERD (gastroesophageal reflux disease)   . GI bleed     a. Recurrent GI bleed, tx with IV iron. b. Per heme notes - likely AVMs 04/2012 (tx with cauterization several months ago).  . Orthostatic hypotension     a. Tx with florinef.  . Hematuria     a. Urology note scan from 07/2012: cystoscopy without evidence for bladder lesion, only lateral hypertrophy of posterior urethra, bladder impression from BPH. b. Pt states he had "some tests" scheduled for later in July 2014.  . Stage III chronic kidney disease     a. Stage 3 (DM with complications ->CKD, peripheral neuropathy).  . Anemia, iron deficiency 03/24/2011    a. Recurrent GI bleed, tx with periodic iron infusions.  . 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  Sleep apnea     "had mask; couldn't sleep in it" (09/20/2012)  . Type II diabetes mellitus     a. Dx 1994, uncontrolled.   . Diabetic peripheral neuropathy 2014    foot pain.  . Chronic lower back pain   . Fatty liver     on CT of 11/2010  . AVM (arteriovenous malformation) of duodenum, acquired     egds in 01/2012, 04/2011  . Polyp, colonic     Colonoscopy 01/2012 "benign" polyp  . CAD (coronary artery disease)     a. Cath 09/2012: moderate borderline CAD in mid LAD/small diagonal branch, mild RCA stenosis, to be managed medically   . BPH (benign prostatic hyperplasia)   . Prostate cancer   . Stroke   . Peripheral neuropathy     Past Surgical History  Procedure Laterality Date  . Shoulder open rotator cuff repair Left 1980's  . Inguinal hernia repair Right 2011  . Lumbar disc surgery  1980's  . Robot assisted laparoscopic radical prostatectomy N/A 01/19/2013    Procedure: ROBOTIC ASSISTED LAPAROSCOPIC  RADICAL PROSTATECTOMY;  Surgeon: Valetta Fuller, MD;  Location: [facility];  Service: Urology;  Laterality: N/A;  . Lymphadenectomy Bilateral 01/19/2013    Procedure: LYMPHADENECTOMY;  Surgeon: Valetta Fuller, MD;  Location: [facility];  Service: Urology;  Laterality: Bilateral;  . Prostatectomy      Prior to Admission medications   Medication Sig Start Date End Date Taking? Authorizing Provider  aspirin 81 MG chewable tablet Chew by mouth daily.   Yes Historical Provider, MD  atorvastatin (LIPITOR) 80 MG tablet Take 80 mg by mouth daily.   Yes Historical Provider, MD  carvedilol (COREG) 3.125 MG tablet Take 3.125 mg by mouth 2 (two) times daily with a meal.   Yes Historical Provider, MD  fluticasone (FLONASE) 50 MCG/ACT nasal spray Place 2 sprays into the nose daily as needed for allergies.    Yes Historical Provider, MD  gabapentin (NEURONTIN) 300 MG capsule Take 300 mg by mouth at bedtime.   Yes Historical Provider, MD  guaiFENesin (MUCINEX) 600 MG 12 hr tablet Take 1 tablet (600 mg total) by mouth 2 (two) times daily. 02/22/13  Yes April K Palumbo-Rasch, MD  hydrALAZINE (APRESOLINE) 10 MG tablet Take 1 tablet (10 mg total) by mouth every 8 (eight) hours. 02/24/13  Yes Dorothea Ogle, MD  insulin glargine (LANTUS) 100 UNIT/ML injection Inject 10 Units into the skin at bedtime.    Yes Historical Provider, MD  insulin lispro (HUMALOG) 100 UNIT/ML injection Inject 3 Units into the skin 3 (three) times daily before meals.    Yes Historical Provider, MD  lisinopril (PRINIVIL,ZESTRIL) 40 MG tablet Take 40 mg by mouth every morning.   Yes Historical Provider, MD  nitroGLYCERIN (NITROSTAT) 0.4 MG SL tablet Place 1 tablet (0.4 mg total) under the tongue every 5 (five) minutes as needed for chest pain (up to 3 doses). 09/22/12  Yes Dayna N Dunn, PA-C  pantoprazole (PROTONIX) 40 MG tablet Take 1 tablet (40 mg total) by mouth daily. 01/17/13  Yes Sandford Craze, NP  polyvinyl alcohol (LIQUIFILM TEARS) 1.4 %  ophthalmic solution Place 1 drop into both eyes daily as needed (dry eyes).   Yes Historical Provider, MD    Scheduled Meds: . sodium chloride   Intravenous STAT  . atorvastatin  80 mg Oral Daily  . carvedilol  3.125 mg Oral BID WC  . ferrous sulfate  325 mg Oral TID WC  . hydrALAZINE  10 mg Oral Q8H  . insulin aspart  0-5 Units Subcutaneous QHS  . insulin aspart  0-9 Units Subcutaneous Q6H  . insulin glargine  10 Units Subcutaneous QHS  . pantoprazole (PROTONIX) IV  40 mg Intravenous Q12H  . sodium chloride  3 mL Intravenous Q12H   Infusions:   PRN Meds: acetaminophen, acetaminophen, menthol-cetylpyridinium, nitroGLYCERIN, ondansetron (ZOFRAN) IV, ondansetron   Allergies as of 02/28/2013  . (No Known Allergies)    Family History  Problem Relation Age of Onset  . Heart attack Brother     Died at 30  . Stroke Father     Died at 43  . Diabetes Sister   . Diabetes Mother   . Stomach cancer Brother   . Heart attack Sister     History   Social History  . Marital Status: Married    Spouse Name: N/A    Number of Children: 2  . Years of Education: N/A   Occupational History  . taken out of work due to back    Social History Main Topics  . Smoking status: Former Smoker -- 1.00 packs/day for 48 years    Types: Cigarettes    Quit date: 03/17/2012  . Smokeless tobacco: Never Used  . Alcohol Use: No     Comment: 09/20/2012 "Used to; stopped ~ 2009; never had problem w/it"  . Drug Use: No  . Sexual Activity: No   Other Topics Concern  . Not on file   Social History Narrative   Regular exercise: rides horses   Caffeine use: occasionally    REVIEW OF SYSTEMS: Constitutional:  No weight loss.  Good appetite ENT:  No nose bleeds Pulm:  + DOE, no  CV:  No palpitations, no LE edema.  GU:  No hematuria, no frequency GI:  Per HPI Heme:  Per HPI    Transfusions:  Per HPI Neuro:  No headaches, no peripheral tingling or numbness Derm:  No itching, no rash or sores.   Endocrine:  No sweats or chills.  No polyuria or dysuria Immunization:  Up to date on flu shot Travel:  None beyond local counties in last few months.    PHYSICAL EXAM: Vital signs in last 24 hours: Filed Vitals:   03/01/13 0840  BP: 153/85  Pulse: 99  Temp: 98.6 F (37 C)  Resp: 18   Wt Readings from Last 3 Encounters:  02/28/13 71.5 kg (157 lb 10.1 oz)  02/28/13 72.122 kg (159 lb)  02/22/13 70.035 kg (154 lb 6.4 oz)    General: looks well Head:  No swelling or asymmetry.   Eyes:  No icterus, no pallor Ears:  Not HOH  Nose:  No discharge Mouth:  Moist, clear.  Poor dentition Neck:  No mass, no TMG Lungs:  Clear, no labored breathing.  Heart: RRR.  No MRG.   Abdomen:  Soft, NT, ND.  No bruits.  Active BS.   Rectal: deferred   Musc/Skeltl: no joint swelling,  Extremities:  No pedal edema  Neurologic:  No confusion, no tremor.  Limbs with full and equal strength Skin:  No AVMs Tattoos:  none Nodes:  No cervical adenopathy.    Psych:  Pleasant. Relaxed   Intake/Output from previous day: 12/15 0701 - 12/16 0700 In: 12.5 [Blood:12.5] Out: -  Intake/Output this shift: Total I/O In: 187.5 [Blood:187.5] Out: -   LAB RESULTS:  Recent Labs  02/28/13 1324 03/01/13 0724  WBC 8.7 7.6  HGB 6.8* 6.9*  HCT 21.1* 20.1*  PLT 285 218   BMET Lab Results  Component Value Date   NA 138 03/01/2013   NA 135 02/28/2013   NA 136 02/28/2013   K 3.7 03/01/2013   K 3.6 02/28/2013   K 3.9 02/28/2013   CL 108 03/01/2013   CL 102 02/28/2013   CL 101 02/28/2013   CO2 22 03/01/2013   CO2 24 02/28/2013   CO2 30 02/28/2013   GLUCOSE 205* 03/01/2013   GLUCOSE 418* 02/28/2013   GLUCOSE 409* 02/28/2013   BUN 19 03/01/2013   BUN 28* 02/28/2013   BUN 24* 02/28/2013   CREATININE 1.03 03/01/2013   CREATININE 1.10 02/28/2013   CREATININE 1.2 02/28/2013   CALCIUM 8.0* 03/01/2013   CALCIUM 8.7 02/28/2013   CALCIUM 8.5 02/28/2013   LFT  Recent Labs  02/28/13 1324  03/01/13 0724  PROT 5.9* 5.0*  ALBUMIN  --  2.1*  AST 11 11  ALT 15 9  ALKPHOS 99* 81  BILITOT 0.50 1.0   PT/INR Lab Results  Component Value Date   INR 0.99 03/01/2013   INR 0.92 01/04/2013   INR 0.97 12/03/2012     ENDOSCOPIC STUDIES: Waiting on reports  IMPRESSION:   *  Chronic anemia.  Iron deficient.  Requiring transfusions, one week ago and now.  Chronic iron infusions for at least 3 years.   *  Heme + stool. Duodenal AVMs in 01/2012 and 04/2011.  Colonoscopy in 01/2012 with benign polyp. Waiting on official reports to confirm this.   *  DM 2.  Poorly controlled.  Sugars repeatedly in 200s to 400s in last several months.  a1c 9.5 in 10/2012  *  Fatty liver, stable right hepatic lobe hemangioma.  Seen on CT of 2012.   *  S/p robotic arm prostatectomy 01/2014 for prostate cancer.   *  Stage 3 CKD, may be contributing to anemia.     PLAN:     *  Requested fax of gi records from [place].  enteroscopy today.  *  Po Protonix *  Stopped oral iron, with infusions he should not require this and it is causing constipation.  Would let Dr Myna Hidalgo decide if he needs po Iron.    Jennye Moccasin  03/01/2013, 9:51 AM Pager: [phone]  Chart was reviewed and patient was examined. X-rays and lab were reviewed.    I agree with management and plans.  With h/o duodenal AVMS he most likely has recurrent or persistent AVMs causing subacute bleeding.  Plan for enteroscopy today.  Depending on results, double balloon enteroscopy is a worthy consideration.  Barbette Hair. Arlyce Dice, M.D., [facility] Cell [phone]

## 2013-03-01 NOTE — Progress Notes (Signed)
CRITICAL VALUE ALERT  Critical value received: Hemoglobin 6.9  Date of notification:  03/01/13  Time of notification:  0804  Critical value read back:yes  Nurse who received alert:  Beckey Downing  MD notified (1st page):  Dr. York Spaniel  Time of first page:  0806  MD notified (2nd page):  Time of second page:  Responding MD:  Dr. York Spaniel  Time MD responded:  [phone]

## 2013-03-01 NOTE — Telephone Encounter (Signed)
Left detailed message for patient to call our office to schedule hospital follow up.

## 2013-03-01 NOTE — Telephone Encounter (Signed)
Returned pt's number.  No answer. Left message requesting that pt return my call in AM if he has additional questions.

## 2013-03-01 NOTE — Progress Notes (Signed)
Enteroscopy demonstrated multiple bleeding AVMs in the jejunum and duodenum.  These were cauterized with the argon plasma coagulator.  Recommendations #1 continue iron supplementation #2 if patient drops hemoglobin despite iron supplementation would proceed with double balloon enteroscopy at the intention of treating any AVMs

## 2013-03-01 NOTE — Progress Notes (Signed)
Inpatient Diabetes Program Recommendations  AACE/ADA: New Consensus Statement on Inpatient Glycemic Control (2013)  Target Ranges:  Prepandial:   less than 140 mg/dL      Peak postprandial:   less than 180 mg/dL (1-2 hours)      Critically ill patients:  140 - 180 mg/dL   Reason for Visit: Results for [name], [name] (MRN [id number]) as of 03/01/2013 09:54  Ref. Range 02/28/2013 17:29 02/28/2013 19:11 03/01/2013 00:26 03/01/2013 03:05 03/01/2013 06:16 03/01/2013 08:11  Glucose-Capillary Latest Range: 70-99 mg/dL 295 (H) 284 (H) 132 (H) 175 (H) 200 (H) 192 (H)   Please restart a portion of patient's home dose of Lantus.  Of note, patient has been on Lantus 45 units daily and Humalog 5 units tid with meals prior to admit.  Patient was in the hospital last week and experienced mild hypoglycemia with Lantus 30 units.   Please consider restarting Lantus 25 units daily.    Will follow. Thanks, Beryl Meager, RN, BC-ADM Inpatient Diabetes Coordinator Pager [phone]

## 2013-03-01 NOTE — Progress Notes (Signed)
[facility] PROGRESS NOTE  CHESNEY KLIMASZEWSKI ZOX:[id number] DOB: [birth year] DOA: 02/28/2013 PCP: Lemont Fillers., NP  Assessment/Plan: 67 y.o. male past medical history significant for HTN, HLD, CKD III, DM2 uncontrolled with complications, CVA, prostate cancer s/p robotic-assisted laparoscopic radical retropubic prostatectomy with bilateral pelvic lymph node dissection 01/19/2013, chronic anemia with h/o GIB s/p cauterizion of AVMs in [place] presented with fatigue, weakness to oncology office admitted with acute anemia +hemoccult  1. Acute blood loss anemia likely due to GIB; hemodynamically stable;  -Hg still 6.9<--6.8 post TF 1 units; TF 2 more units; monitor Hg; hold ASA -d/w GI, NPO possible endoscopy today   2. DM cont iSS: while NPO; last admission: patient has been on Lantus 45 units daily and Humalog 5 units tid with meals. Patient was in the hospital last week and experienced mild hypoglycemia with Lantus 30 units.  -lantus 25 to be restarted post procedure;   3. S/p robotic arm prostatectomy 01/2014 for prostate cancer -cont outpatient follow up   4. HTN cont home regimen      Code Status: full Family Communication: d/w patient  (indicate person spoken with, relationship, and if by phone, the number) Disposition Plan: home 24-48 hours    Consultants:  GI  Procedures:  Pend endoscopy per GI  Antibiotics:  None  (indicate start date, and stop date if known)  HPI/Subjective: alert  Objective: Filed Vitals:   03/01/13 1100  BP: 152/80  Pulse: 90  Temp: 98.5 F (36.9 C)  Resp: 18    Intake/Output Summary (Last 24 hours) at 03/01/13 1204 Last data filed at 03/01/13 1000  Gross per 24 hour  Intake    375 ml  Output      0 ml  Net    375 ml   Filed Weights   02/28/13 1720 02/28/13 2207  Weight: 72.122 kg (159 lb) 71.5 kg (157 lb 10.1 oz)    Exam:   General:  alert  Cardiovascular: s1,s2 rrr  Respiratory: CTA BL  Abdomen: soft, nt,  nd   Musculoskeletal: non LE edema    Data Reviewed: Basic Metabolic Panel:  Recent Labs Lab 02/23/13 0543 02/23/13 0553 02/24/13 0501 02/28/13 1324 02/28/13 1755 03/01/13 0724  NA 139  --  136 136 135 138  K 2.8*  --  3.9 3.9 3.6 3.7  CL 106  --  104 101 102 108  CO2 26  --  21 30 24 22   GLUCOSE 96  --  325* 409* 418* 205*  BUN 9  --  11 24* 28* 19  CREATININE 1.14  --  1.17 1.2 1.10 1.03  CALCIUM 8.4  --  8.4 8.5 8.7 8.0*  MG  --  1.9  --   --   --   --    Liver Function Tests:  Recent Labs Lab 02/22/13 1245 02/28/13 1324 03/01/13 0724  AST 9 11 11   ALT 7 15 9   ALKPHOS 121* 99* 81  BILITOT 0.3 0.50 1.0  PROT 6.2 5.9* 5.0*  ALBUMIN 2.5*  --  2.1*   No results found for this basename: LIPASE, AMYLASE,  in the last 168 hours No results found for this basename: AMMONIA,  in the last 168 hours CBC:  Recent Labs Lab 02/22/13 1245 02/23/13 0543 02/24/13 0501 02/28/13 1324 03/01/13 0724  WBC 4.6 5.6 8.0 8.7 7.6  NEUTROABS 3.2  --   --  6.2  --   HGB 7.0* 9.3* 9.7* 6.8* 6.9*  HCT 20.6* 26.8*  28.6* 21.1* 20.1*  MCV 83.1 81.5 82.4 88 87.0  PLT 329 286 309 285 218   Cardiac Enzymes: No results found for this basename: CKTOTAL, CKMB, CKMBINDEX, TROPONINI,  in the last 168 hours BNP (last 3 results) No results found for this basename: PROBNP,  in the last 8760 hours CBG:  Recent Labs Lab 02/28/13 1911 03/01/13 0026 03/01/13 0305 03/01/13 0616 03/01/13 0811  GLUCAP 302* 145* 175* 200* 192*    No results found for this or any previous visit (from the past 240 hour(s)).   Studies: No results found.  Scheduled Meds: . sodium chloride   Intravenous STAT  . atorvastatin  80 mg Oral Daily  . carvedilol  3.125 mg Oral BID WC  . hydrALAZINE  10 mg Oral Q8H  . insulin aspart  0-5 Units Subcutaneous QHS  . insulin aspart  0-9 Units Subcutaneous Q6H  . insulin glargine  10 Units Subcutaneous QHS  . pantoprazole  40 mg Oral Q0600  . sodium chloride  3 mL  Intravenous Q12H   Continuous Infusions:   Principal Problem:   GI bleed Active Problems:   Anemia, iron deficiency   Diabetes mellitus type 2, uncontrolled   Hyperlipidemia   Diabetic neuropathy   CAD (coronary artery disease)    Time spent: >35 minutes     Esperanza Sheets  [facility] Pager [phone]. If 7PM-7AM, please contact night-coverage at [email or web address], password [facility] 03/01/2013, 12:04 PM  LOS: 1 day

## 2013-03-02 ENCOUNTER — Encounter (HOSPITAL_COMMUNITY): Payer: Self-pay | Admitting: Gastroenterology

## 2013-03-02 ENCOUNTER — Inpatient Hospital Stay (HOSPITAL_COMMUNITY): Payer: Medicare Other

## 2013-03-02 DIAGNOSIS — I10 Essential (primary) hypertension: Secondary | ICD-10-CM

## 2013-03-02 DIAGNOSIS — J690 Pneumonitis due to inhalation of food and vomit: Secondary | ICD-10-CM

## 2013-03-02 DIAGNOSIS — J189 Pneumonia, unspecified organism: Secondary | ICD-10-CM

## 2013-03-02 LAB — GLUCOSE, CAPILLARY
Glucose-Capillary: 144 mg/dL — ABNORMAL HIGH (ref 70–99)
Glucose-Capillary: 190 mg/dL — ABNORMAL HIGH (ref 70–99)
Glucose-Capillary: 226 mg/dL — ABNORMAL HIGH (ref 70–99)
Glucose-Capillary: 229 mg/dL — ABNORMAL HIGH (ref 70–99)

## 2013-03-02 LAB — CBC
HCT: 28 % — ABNORMAL LOW (ref 39.0–52.0)
Hemoglobin: 9.6 g/dL — ABNORMAL LOW (ref 13.0–17.0)
MCV: 84.6 fL (ref 78.0–100.0)
Platelets: 234 10*3/uL (ref 150–400)
RBC: 3.31 MIL/uL — ABNORMAL LOW (ref 4.22–5.81)
WBC: 18.2 10*3/uL — ABNORMAL HIGH (ref 4.0–10.5)

## 2013-03-02 LAB — URINE MICROSCOPIC-ADD ON

## 2013-03-02 LAB — HEMOGLOBIN AND HEMATOCRIT, BLOOD
HCT: 25.8 % — ABNORMAL LOW (ref 39.0–52.0)
Hemoglobin: 9.2 g/dL — ABNORMAL LOW (ref 13.0–17.0)

## 2013-03-02 LAB — URINALYSIS, ROUTINE W REFLEX MICROSCOPIC
Bilirubin Urine: NEGATIVE
Leukocytes, UA: NEGATIVE
Nitrite: NEGATIVE
Protein, ur: >300 mg/dL — AB
Specific Gravity, Urine: 1.021 (ref 1.005–1.030)
Urobilinogen, UA: 1 mg/dL (ref 0.0–1.0)
pH: 6 (ref 5.0–8.0)

## 2013-03-02 LAB — TYPE AND SCREEN
Unit division: 0
Unit division: 0

## 2013-03-02 MED ORDER — LEVOFLOXACIN IN D5W 750 MG/150ML IV SOLN
750.0000 mg | INTRAVENOUS | Status: DC
  Administered 2013-03-02 – 2013-03-03 (×2): 750 mg via INTRAVENOUS
  Filled 2013-03-02 (×3): qty 150

## 2013-03-02 MED ORDER — GUAIFENESIN ER 600 MG PO TB12
600.0000 mg | ORAL_TABLET | Freq: Two times a day (BID) | ORAL | Status: DC
  Administered 2013-03-02 – 2013-03-03 (×4): 600 mg via ORAL
  Filled 2013-03-02 (×5): qty 1

## 2013-03-02 MED ORDER — PIPERACILLIN-TAZOBACTAM 3.375 G IVPB
3.3750 g | Freq: Three times a day (TID) | INTRAVENOUS | Status: DC
  Administered 2013-03-02 – 2013-03-03 (×4): 3.375 g via INTRAVENOUS
  Filled 2013-03-02 (×7): qty 50

## 2013-03-02 MED ORDER — WHITE PETROLATUM GEL
Status: AC
  Administered 2013-03-02: 0.2
  Filled 2013-03-02: qty 5

## 2013-03-02 NOTE — Progress Notes (Signed)
Patient seen, examined, and I agree with the above documentation, including the assessment and plan. 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

## 2013-03-02 NOTE — Progress Notes (Signed)
DIAGNOSES: 1. Recurrent iron-deficiency anemia. 2. Continued gastrointestinal bleeding. 3. Poorly-controlled insulin-dependent diabetes. 4. Resected stage I prostate cancer.  CURRENT THERAPY: 1. IV iron as indicated - the patient received a dose today in the     office. 2. Aranesp 300 mcg subcu as needed for hemoglobin less than 10.  INTERIM HISTORY:  [name] comes in for a followup.  Unfortunately, he is looking pretty bad.  He was just discharged from the hospital last week.  He was there for anemia.  I am not sure exactly what was done and what was not done.  It looks like they gave him blood now __________.  I wanted him to make an appointment with a GI doctor as an outpatient. __________ back in the hospital.  His blood sugars are poorly controlled.  When he came in, his blood sugar was 409.  We gave him 15 units of regular insulin.  This brought his blood sugar down to 409 still.  He does not check his blood sugars at home as he needs to.  He had his prostate cancer surgery back in, I think, November.  I think he had a stage I disease.  He had the robotic surgery.  He is weak.  There is some dizziness.  Again, he needs to be admitted.  PHYSICAL EXAMINATION:  General:  This is a somewhat ill-appearing African American gentleman, in no obvious distress.  Vital Signs:  Show temperature of 98.2, pulse 100, respiratory rate 18, blood pressure 100/54, weight is 159 pounds.  Head and Neck Exam:  Shows a normocephalic, atraumatic skull.  There are no ocular or oral lesions. There are no palpable cervical or supraclavicular lymph nodes.  Lungs: Clear bilaterally.  Cardiac Exam:  Tachycardic, but regular.  He has no murmurs, rubs, or bruits.  Abdomen:  Soft.  He has good bowel sounds. There is no fluid wave.  There is no abdominal mass.  There is no palpable hepatosplenomegaly.  Rectal Exam:  Shows black stool that is heme positive.  Extremities:  Some trace edema in his lower  legs.  Skin: No rashes.  LABORATORY STUDIES:  White cell count 8.7, hemoglobin 6.8, hematocrit 21.1, platelet count 285.  Ferritin 28 with an iron saturation of 22%. Glucose is 409.  BUN 24, creatinine 1.2.  IMPRESSION:  [name] is a 67 year old gentleman.  He has chronic gastrointestinal bleeding.  This really needs to be sorted out.  He needs to be seen by Gastroenterology as an inpatient.  He needs upper and lower endoscopy.  If this is negative, then he needs to be given a capsule endoscopy.  He will need to be transfused.  We did give him iron in the office today, so he will not need that in the hospital.  He needs to be seen by the diabetic coordinator.  He needs to have his blood sugars better controlled.  He does not do this at home.  I am not sure if he really knows how to do it properly.  I do not think he has ever been given instruction on this.  I will correct myself, he was seen by the diabetes coordinator on 12/11. She __________ a wonderful note.  However, I just do not think he is doing what he needs to do.  Maybe, if he can be seen again as an inpatient, this will finally "register" with him.  I will probably get him back to see me in another month or so.  Again, I just  feel bad he keeps coming into the hospital. Unfortunately, I just do not think that he is going to really do all that well as an outpatient.  This is very complicated.  We spent probably close to 45 minutes with [name] today.  I called the emergency room to get him admitted by the hospitalist.  I will see [name] while he is in the hospital.    ______________________________ [name], M.D. PRE/MEDQ  D:  02/28/2013  T:  03/01/2013  Job:  [id number]

## 2013-03-02 NOTE — Progress Notes (Addendum)
Shift event: RN paged NP earlier stating pt c/o dizziness. No other c/o's. BP in the 170s, T 100.4, RR normal, HR 120-sinus tach. Hydralazine 5mg  IV given x 1. Placed pt on some judicious IVF at 75cc/hr x 4 hours for temp. Ordered H/H which is pending. He is s/p endoscopy today with coag of bleeding. Sedation from today could be causing his symptoms as well as fever and higher BP. Will cont to monitor. Jimmye Norman, NP [facility] Update: Pt has had no further complaints. CBC showed elevated WBCC at 18, 200. H/H stable at 9.6/28. Ordered CXR and UA. CXR positive for probable early LLL PNA. UA pending. Blood cultures ordered for T > 101.5. Abx started for HAP. Mucinex ordered.  Jimmye Norman, NP

## 2013-03-02 NOTE — Care Management Note (Signed)
    Page 1 of 1   03/03/2013     3:23:47 PM   CARE MANAGEMENT NOTE 03/03/2013  Patient:  DEOVION, BATREZ   Account Number:  [id number]  Date Initiated:  03/02/2013  Documentation initiated by:  Letha Cape  Subjective/Objective Assessment:   dx gib  admit- lives with grand daughter.     Action/Plan:   Anticipated DC Date:  03/03/2013   Anticipated DC Plan:  HOME/SELF CARE      DC Planning Services  CM consult      Choice offered to / List presented to:             Status of service:  Completed, signed off Medicare Important Message given?   (If response is "NO", the following Medicare IM given date fields will be blank) Date Medicare IM given:   Date Additional Medicare IM given:    Discharge Disposition:  HOME/SELF CARE  Per UR Regulation:    If discussed at Long Length of Stay Meetings, dates discussed:    Comments:  03/03/13 15:20 Letha Cape RN, BSN [phone] patient lives with spouse, patient for dc to home today, no NCM referral, no needs anticipated.  Patient has agreed to [facility] follow up after discharge.  [facility] rep Raiford Noble spoke with patient.

## 2013-03-02 NOTE — Progress Notes (Addendum)
[facility] PROGRESS NOTE  ARIANA CAVENAUGH ZOX:[id number] DOB: Feb 28, 1946 DOA: 02/28/2013 PCP: Lemont Fillers., NP  Assessment/Plan: 67 y.o. male past medical history significant for HTN, HLD, CKD III, DM2 uncontrolled with complications, CVA, prostate cancer s/p robotic-assisted laparoscopic radical retropubic prostatectomy with bilateral pelvic lymph node dissection 01/19/2013, chronic anemia with h/o GIB s/p cauterizion of AVMs in [place] presented with fatigue, weakness to oncology office admitted with acute anemia +hemoccult  1. Acute blood loss anemia likely due to GIB; hemodynamically stable;  -Hg still 6.9<--6.8 post TF 1 units; TF 2 more units; monitor Hg; hold ASA - s/p Enteroscopy 03/01/13: multiple bleeding AVMs in the duodenum and jejunum>>status post cauterization -noted pt had Colonoscopy in 01/2012 >>benign polyp.    2. DM cont iSS: while NPO; last admission: patient has been on Lantus 45 units daily and Humalog 5 units tid with meals. Patient was in the hospital last week and experienced mild hypoglycemia with Lantus 30 units.  -follow and resume lantus at  25  unnits  3. S/p robotic arm prostatectomy 01/2014 for prostate cancer -cont outpatient follow up  4. Probable HAP  5.. HTN cont home regimen   6.HCAP/HAP - fever last pm CXR c/w early LLL PNA and pt place on abx -follow and if remains afebrile, plan d/c in am   Code Status: full Family Communication: d/w patient   Disposition Plan: home 24-48 hours    Consultants:  GI  Procedures:  S/p enteroscopy per gi  Antibiotics:  None  (indicate start date, and stop date if known)  HPI/Subjective: denies bleeding, tolerating po. Mild cough, denies SOB  Objective: Filed Vitals:   03/02/13 1408  BP: 139/77  Pulse: 100  Temp: 98.2 F (36.8 C)  Resp: 22    Intake/Output Summary (Last 24 hours) at 03/02/13 1427 Last data filed at 03/02/13 0948  Gross per 24 hour  Intake 1042.5 ml  Output     450 ml  Net  592.5 ml   Filed Weights   02/28/13 1720 02/28/13 2207  Weight: 72.122 kg (159 lb) 71.5 kg (157 lb 10.1 oz)    Exam:   General:  Alert and oriented x3  Cardiovascular: s1,s2 rrr  Respiratory: CTA BL  Abdomen: soft, nt, nd   Musculoskeletal: no LE edema    Data Reviewed: Basic Metabolic Panel:  Recent Labs Lab 02/24/13 0501 02/28/13 1324 02/28/13 1755 03/01/13 0724  NA 136 136 135 138  K 3.9 3.9 3.6 3.7  CL 104 101 102 108  CO2 21 30 24 22   GLUCOSE 325* 409* 418* 205*  BUN 11 24* 28* 19  CREATININE 1.17 1.2 1.10 1.03  CALCIUM 8.4 8.5 8.7 8.0*   Liver Function Tests:  Recent Labs Lab 02/28/13 1324 03/01/13 0724  AST 11 11  ALT 15 9  ALKPHOS 99* 81  BILITOT 0.50 1.0  PROT 5.9* 5.0*  ALBUMIN  --  2.1*   No results found for this basename: LIPASE, AMYLASE,  in the last 168 hours No results found for this basename: AMMONIA,  in the last 168 hours CBC:  Recent Labs Lab 02/24/13 0501 02/28/13 1324 03/01/13 0724 03/01/13 1835 03/02/13 0025 03/02/13 0825  WBC 8.0 8.7 7.6  --  18.2*  --   NEUTROABS  --  6.2  --   --   --   --   HGB 9.7* 6.8* 6.9* 10.3* 9.6* 9.2*  HCT 28.6* 21.1* 20.1* 30.3* 28.0* 26.7*  MCV 82.4 88 87.0  --  84.6  --   PLT 309 285 218  --  234  --    Cardiac Enzymes: No results found for this basename: CKTOTAL, CKMB, CKMBINDEX, TROPONINI,  in the last 168 hours BNP (last 3 results) No results found for this basename: PROBNP,  in the last 8760 hours CBG:  Recent Labs Lab 03/01/13 1738 03/01/13 2207 03/01/13 2348 03/02/13 0644 03/02/13 1200  GLUCAP 188* 212* 249* 229* 226*    No results found for this or any previous visit (from the past 240 hour(s)).   Studies: Dg Chest Port 1 View  03/02/2013   CLINICAL DATA:  Fever and leukocytosis  EXAM: PORTABLE CHEST - 1 VIEW  COMPARISON:  02/22/2013  FINDINGS: Subtle infiltrate at the left base, not seen previously. Normal heart size. No effusion or pneumothorax.   IMPRESSION: Question early pneumonia at the left base.   Electronically Signed   By: Tiburcio Pea M.D.   On: 03/02/2013 01:30    Scheduled Meds: . atorvastatin  80 mg Oral Daily  . carvedilol  3.125 mg Oral BID WC  . guaiFENesin  600 mg Oral BID  . hydrALAZINE  10 mg Oral Q8H  . insulin aspart  0-5 Units Subcutaneous QHS  . insulin aspart  0-9 Units Subcutaneous Q6H  . insulin glargine  10 Units Subcutaneous QHS  . levofloxacin (LEVAQUIN) IV  750 mg Intravenous Q24H  . pantoprazole  40 mg Oral Q0600  . piperacillin-tazobactam (ZOSYN)  IV  3.375 g Intravenous Q8H  . sodium chloride  3 mL Intravenous Q12H   Continuous Infusions:   Principal Problem:   GI bleed Active Problems:   Anemia, iron deficiency   Diabetes mellitus type 2, uncontrolled   Hyperlipidemia   Diabetic neuropathy   CAD (coronary artery disease)   Intestinal angiodysplasia with bleeding    Time spent: >35 minutes     Tobias Avitabile C  [facility] Pager [phone]. If 7PM-7AM, please contact night-coverage at [email or web address], password [facility] 03/02/2013, 2:27 PM  LOS: 2 days

## 2013-03-02 NOTE — Progress Notes (Signed)
Pt BP still high after giving scheduled hydralazine. Will re-check in 2hrs.

## 2013-03-02 NOTE — Progress Notes (Signed)
ANTIBIOTIC CONSULT NOTE - INITIAL  Pharmacy Consult for zosyn and levaquin Indication: HAP  No Known Allergies  Patient Measurements: Height: 5\' 7"  (170.2 cm) Weight: 157 lb 10.1 oz (71.5 kg) IBW/kg (Calculated) : 66.1 Adjusted Body Weight:   Vital Signs: Temp: 100.4 F (38 C) (12/16 2321) Temp src: Oral (12/16 2321) BP: 170/76 mmHg (12/16 2321) Pulse Rate: 120 (12/16 2321) Intake/Output from previous day: 12/16 0701 - 12/17 0700 In: 725 [Blood:725] Out: 200 [Urine:200] Intake/Output from this shift: Total I/O In: -  Out: 200 [Urine:200]  Labs:  Recent Labs  02/28/13 1324 02/28/13 1755 03/01/13 0724 03/01/13 1835 03/02/13 0025  WBC 8.7  --  7.6  --  18.2*  HGB 6.8*  --  6.9* 10.3* 9.6*  PLT 285  --  218  --  234  CREATININE 1.2 1.10 1.03  --   --    Estimated Creatinine Clearance: 66 ml/min (by C-G formula based on Cr of 1.03). No results found for this basename: VANCOTROUGH, VANCOPEAK, VANCORANDOM, GENTTROUGH, GENTPEAK, GENTRANDOM, TOBRATROUGH, TOBRAPEAK, TOBRARND, AMIKACINPEAK, AMIKACINTROU, AMIKACIN,  in the last 72 hours   Microbiology: No results found for this or any previous visit (from the past 720 hour(s)).  Medical History: Past Medical History  Diagnosis Date  . Hypertension   . High cholesterol   . GERD (gastroesophageal reflux disease)   . GI bleed     a. Recurrent GI bleed, tx with IV iron. b. Per heme notes - likely AVMs 04/2012 (tx with cauterization several months ago).  . Orthostatic hypotension     a. Tx with florinef.  . Hematuria     a. Urology note scan from 07/2012: cystoscopy without evidence for bladder lesion, only lateral hypertrophy of posterior urethra, bladder impression from BPH. b. Pt states he had "some tests" scheduled for later in July 2014.  . Stage III chronic kidney disease     a. Stage 3 (DM with complications ->CKD, peripheral neuropathy).  . Anemia, iron deficiency 03/24/2011    a. Recurrent GI bleed, tx with periodic  iron infusions.  . 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 Sleep apnea     "had mask; couldn't sleep in it" (09/20/2012)  . Type II diabetes mellitus     a. Dx 1994, uncontrolled.   . Diabetic peripheral neuropathy 2014    foot pain.  . Chronic lower back pain   . Fatty liver     on CT of 11/2010  . AVM (arteriovenous malformation) of duodenum, acquired     egds in 01/2012, 04/2011  . Polyp, colonic     Colonoscopy 01/2012 "benign" polyp  . CAD (coronary artery disease)     a. Cath 09/2012: moderate borderline CAD in mid LAD/small diagonal branch, mild RCA stenosis, to be managed medically   . BPH (benign prostatic hyperplasia)   . Prostate cancer   . Stroke   . Peripheral neuropathy     Medications:  Prescriptions prior to admission  Medication Sig Dispense Refill  . aspirin 81 MG chewable tablet Chew by mouth daily.      [name] Kitchen atorvastatin (LIPITOR) 80 MG tablet Take 80 mg by mouth daily.      . carvedilol (COREG) 3.125 MG tablet Take 3.125 mg by mouth 2 (two) times daily with a meal.      . fluticasone (FLONASE) 50 MCG/ACT nasal spray Place 2 sprays into the nose daily as needed for allergies.       [name] Kitchen gabapentin (NEURONTIN) 300 MG capsule  Take 300 mg by mouth at bedtime.      [name] Kitchen guaiFENesin (MUCINEX) 600 MG 12 hr tablet Take 1 tablet (600 mg total) by mouth 2 (two) times daily.  14 tablet  0  . hydrALAZINE (APRESOLINE) 10 MG tablet Take 1 tablet (10 mg total) by mouth every 8 (eight) hours.  90 tablet  1  . insulin glargine (LANTUS) 100 UNIT/ML injection Inject 10 Units into the skin at bedtime.       . insulin lispro (HUMALOG) 100 UNIT/ML injection Inject 3 Units into the skin 3 (three) times daily before meals.       [name] Kitchen lisinopril (PRINIVIL,ZESTRIL) 40 MG tablet Take 40 mg by mouth every morning.      . nitroGLYCERIN (NITROSTAT) 0.4 MG SL tablet Place 1 tablet (0.4 mg total) under the tongue every 5 (five) minutes as needed for chest pain (up to 3 doses).  25 tablet  2  . pantoprazole  (PROTONIX) 40 MG tablet Take 1 tablet (40 mg total) by mouth daily.  30 tablet  3  . polyvinyl alcohol (LIQUIFILM TEARS) 1.4 % ophthalmic solution Place 1 drop into both eyes daily as needed (dry eyes).       Assessment: HAP   Goal of Therapy:    Plan:  levaquin 750 mg q24h  Zosyn 3.375 gm q8h   Janice Coffin 03/02/2013,2:43 AM

## 2013-03-02 NOTE — Progress Notes (Signed)
Inpatient Diabetes Program Recommendations  AACE/ADA: New Consensus Statement on Inpatient Glycemic Control (2013)  Target Ranges:  Prepandial:   less than 140 mg/dL      Peak postprandial:   less than 180 mg/dL (1-2 hours)      Critically ill patients:  140 - 180 mg/dL   Reason for Visit: Results for [name], [name] (MRN [id number]) as of 03/02/2013 15:19  Ref. Range 03/01/2013 17:38 03/01/2013 22:07 03/01/2013 23:48 03/02/2013 06:44 03/02/2013 12:00  Glucose-Capillary Latest Range: 70-99 mg/dL 782 (H) 956 (H) 213 (H) 229 (H) 226 (H)   Spoke to patient regarding his diabetes management.  He states "It goes up and down".  Patient states he checks his CBG's 3 times a day.  Recently in the hospital his insulin dose was reduced due to hypoglycemia.  CBG's in the hospital not too bad, however it appears that CBG's are much higher at home likely due to lifestyle/diet.  Patient see's endocrinologist Dr. Talmage Nap.  He states it has "been a while" since he saw her.  Patient likely needs higher dose of basal insulin since dose was decreased at last hospitalization.  Please increase Lantus to 25 units daily.  Patient needs to follow-up with endocrinologist after discharge to adjust medications further.  Beryl Meager, RN, BC-ADM Inpatient Diabetes Coordinator Pager [phone]

## 2013-03-02 NOTE — Progress Notes (Signed)
Came to visit with patient at bedside to explain and offer [facility] Care Management services. He endorses the need for medication education and reinforcement. Consents signed at bedside. He will be called post hospital discharge and will be evaluated for monthly home visits. Left [facility] Care Management packet at bedside and will contact information. Will make inpatient RNCM aware of visit and that [facility] Care Management to follow.  Raiford Noble, MSN-Ed, RN,BSN- [facility] [phone]

## 2013-03-02 NOTE — Progress Notes (Signed)
Daily Rounding Note  03/02/2013, 9:14 AM  LOS: 2 days   SUBJECTIVE:       Would like to go home.  Feels well.  No sweats or chills.  No dyspnea.  Mostly non-productive cough is chronic and not recently worsened.    OBJECTIVE:         Vital signs in last 24 hours:    Temp:  [97.8 F (36.6 C)-100.4 F (38 C)] 97.8 F (36.6 C) (12/17 0525) Pulse Rate:  [85-137] 111 (12/17 0735) Resp:  [16-33] 20 (12/17 0525) BP: (128-225)/(76-127) 128/78 mmHg (12/17 0735) SpO2:  [89 %-100 %] 96 % (12/17 0525) Last BM Date: 02/28/13 General: looks well.  comfortable   Heart: RRR Chest: crackles in bases, > on left, no dyspnea or cough Abdomen: soft, NT, ND.  No mass or HSM  Extremities: no CCE Neuro/Psych:  Fully alert and oriented.  Relaxed, appropriate.   Intake/Output from previous day: 12/16 0701 - 12/17 0700 In: 1287.5 [P.O.:120; I.V.:242.5; Blood:725; IV Piggyback:200] Out: 450 [Urine:450]  Intake/Output this shift: Total I/O In: 240 [P.O.:240] Out: -   Lab Results:  Recent Labs  02/28/13 1324 03/01/13 0724 03/01/13 1835 03/02/13 0025  WBC 8.7 7.6  --  18.2*  HGB 6.8* 6.9* 10.3* 9.6*  HCT 21.1* 20.1* 30.3* 28.0*  PLT 285 218  --  234   BMET  Recent Labs  02/28/13 1324 02/28/13 1755 03/01/13 0724  NA 136 135 138  K 3.9 3.6 3.7  CL 101 102 108  CO2 30 24 22   GLUCOSE 409* 418* 205*  BUN 24* 28* 19  CREATININE 1.2 1.10 1.03  CALCIUM 8.5 8.7 8.0*   LFT  Recent Labs  02/28/13 1324 03/01/13 0724  PROT 5.9* 5.0*  ALBUMIN  --  2.1*  AST 11 11  ALT 15 9  ALKPHOS 99* 81  BILITOT 0.50 1.0   PT/INR  Recent Labs  03/01/13 0724  LABPROT 12.9  INR 0.99    Studies/Results: Dg Chest Port 1 View  03/02/2013   CLINICAL DATA:  Fever and leukocytosis  EXAM: PORTABLE CHEST - 1 VIEW  COMPARISON:  02/22/2013  FINDINGS: Subtle infiltrate at the left base, not seen previously. Normal heart size. No effusion  or pneumothorax.  IMPRESSION: Question early pneumonia at the left base.   Electronically Signed   By: Tiburcio Pea M.D.   On: 03/02/2013 01:30    ASSESMENT:   * Chronic anemia. Iron deficient. Requiring transfusions, one week ago and now. Chronic iron infusions for at least 3 years.  * Heme + stool. Duodenal AVMs in 01/2012 and 04/2011.  Enteroscopy 03/01/13: multiple bleeding AVMs in the duodenum and jejunum-status post  cauterization with the argon plasma coagulator Colonoscopy in 01/2012 with benign polyp.  *  ? Left base pneumonia. WBC count elevated, + fever.  Empiric Levaquin and Zosyn in place. Has chronic cough but no acute resp sxs or distress. .  * DM 2. Poorly controlled. Sugars repeatedly in 200s to 400s in last several months. A1C 9.5 in 10/2012  * Fatty liver, stable right hepatic lobe hemangioma. Seen on CT of 2012.  * S/p robotic arm prostatectomy 01/2014 for prostate cancer.  * Stage 3 CKD, may be contributing to anemia.      PLAN   *  Has ROV set with Dr Christella Hartigan for 1/21, labs and visit with Dr Myna Hidalgo 03/18/12. *  GI will sign off. Looks well enough to  go home.  *  Would not resume the po Iron.  Pt receives regular infusions of parenteral iron per Dr Myna Hidalgo, defer decisions re oral iron to him.  It has caused constipation as well.  *  Needs tighter control of his sugars    Jennye Moccasin  03/02/2013, 9:14 AM Pager: [phone]

## 2013-03-03 LAB — CBC
Hemoglobin: 8.4 g/dL — ABNORMAL LOW (ref 13.0–17.0)
Platelets: 227 10*3/uL (ref 150–400)
RDW: 17.5 % — ABNORMAL HIGH (ref 11.5–15.5)
WBC: 11 10*3/uL — ABNORMAL HIGH (ref 4.0–10.5)

## 2013-03-03 LAB — TYPE AND SCREEN
Antibody Screen: NEGATIVE
Unit division: 0
Unit division: 0

## 2013-03-03 LAB — BASIC METABOLIC PANEL
BUN: 13 mg/dL (ref 6–23)
CO2: 23 mEq/L (ref 19–32)
Chloride: 107 mEq/L (ref 96–112)
GFR calc Af Amer: 71 mL/min — ABNORMAL LOW (ref >90)
GFR calc non Af Amer: 61 mL/min — ABNORMAL LOW (ref >90)
Potassium: 3.1 mEq/L — ABNORMAL LOW (ref 3.5–5.1)
Sodium: 138 mEq/L (ref 135–145)

## 2013-03-03 LAB — GLUCOSE, CAPILLARY
Glucose-Capillary: 198 mg/dL — ABNORMAL HIGH (ref 70–99)
Glucose-Capillary: 179 mg/dL — ABNORMAL HIGH (ref 70–99)

## 2013-03-03 LAB — HEMOGLOBIN AND HEMATOCRIT, BLOOD: HCT: 25.3 % — ABNORMAL LOW (ref 39.0–52.0)

## 2013-03-03 MED ORDER — BENZONATATE 100 MG PO CAPS: 200.0000 mg | ORAL_CAPSULE | Freq: Three times a day (TID) | ORAL | Status: DC | PRN

## 2013-03-03 MED ORDER — DIPHENHYDRAMINE HCL 25 MG PO CAPS
25.0000 mg | ORAL_CAPSULE | Freq: Once | ORAL | Status: DC
  Filled 2013-03-03: qty 1

## 2013-03-03 MED ORDER — INSULIN GLARGINE 100 UNIT/ML ~~LOC~~ SOLN
25.0000 [IU] | Freq: Every day | SUBCUTANEOUS | Status: DC
  Filled 2013-03-03: qty 0.25

## 2013-03-03 MED ORDER — AMOXICILLIN-POT CLAVULANATE 875-125 MG PO TABS: 1.0000 | ORAL_TABLET | Freq: Two times a day (BID) | ORAL | Status: DC

## 2013-03-03 MED ORDER — FUROSEMIDE 10 MG/ML IJ SOLN
20.0000 mg | Freq: Once | INTRAMUSCULAR | Status: DC
  Filled 2013-03-03: qty 2

## 2013-03-03 MED ORDER — SODIUM CHLORIDE 0.9 % IV SOLN: 250.0000 mL | Freq: Once | INTRAVENOUS | Status: DC

## 2013-03-03 MED ORDER — ACETAMINOPHEN 325 MG PO TABS
650.0000 mg | ORAL_TABLET | Freq: Once | ORAL | Status: DC
  Filled 2013-03-03: qty 2

## 2013-03-03 MED ORDER — PANTOPRAZOLE SODIUM 40 MG PO TBEC: 40.0000 mg | DELAYED_RELEASE_TABLET | Freq: Every day | ORAL | Status: DC

## 2013-03-03 NOTE — Discharge Summary (Signed)
Physician Discharge Summary  [name] MWU:[id number] DOB: 02-24-1946 DOA: 02/28/2013  PCP: [name]., NP  Admit date: 02/28/2013 Discharge date: 03/03/2013  Time spent: >30 minutes  Recommendations for Outpatient Follow-up:  Follow-up Information   Follow up with Troy Macho, MD. (as directed)    Specialty:  Oncology   Contact information:   [address] [place] [address] [phone]       Follow up with Troy Fee, MD. (as scheduled)    Specialty:  Gastroenterology   Contact information:   [address] [place] [address] [phone]       Follow up with [name]., NP. (in 1-2weeks, call for appt upon discharge)    Specialty:  Internal Medicine   Contact information:   [address] [facility] [address] [place] [address] [phone]        Discharge Diagnoses:  Principal Problem:   GI bleed Active Problems:   Anemia, iron deficiency   Diabetes mellitus type 2, uncontrolled   Hyperlipidemia   Diabetic neuropathy   CAD (coronary artery disease)   Intestinal angiodysplasia with bleeding   Discharge Condition: improved/stable  Diet recommendation: modified carb  Filed Weights   02/28/13 1720 02/28/13 2207  Weight: 72.122 kg (159 lb) 71.5 kg (157 lb 10.1 oz)    History of present illness:  [name] is a 67 y.o. male with Past medical history of hypertension, GERD, GI bleed with AVM, orthostatic hypotension, COPD, iron deficiency anemia.  The patient is coming from home.  The patient was seen by his hematologist for iron infusion at which time he was found to be hyperglycemic and also had very low hemoglobin of 6.8. He mentions that since last to 3 weeks he has been having shortness of breath which was associated with dizziness which was progressively worsening. Patient denies any complaint of chest pain, palpitation, orthopnea, PND, cough, fever, chills, diarrhea, melena, active bleeding, blood in  the urine.  He mentions that he has recently started taking Neurontin 1 month ago for neuropathy.  He denies any abdominal pain nausea or vomiting.  He also denies any over the counter supplements or recent upper respiratory infection   Hospital Course:  1. Acute blood loss anemia likely due to GIB; hemodynamically stable;  -upon admission pt's Hg was 6.8 and post TF of 1 unit, it was only 6.9. He was TF 2 more units; monitor Hg; ASA  Was held and on follow up it improved to 10.3 -  GI was consulted and followed and Enteroscopy was done on12/16/14: multiple bleeding AVMs in the duodenum and jejunum>>status post cauterization  -noted pt had Colonoscopy in 01/2012 >>benign polyp.  -On follow up he did not have any further gross bleeding and he was started on a diet which he tolerated and it was advanced and he was tolerating solids on d/c. Hgb was stable on dc at 8.9 and he was to follow up with GI and PCP 2. DM : it was noted that last admission: patient has been on Lantus 45 units daily and Humalog 5 units tid with meals. Patient was in the hospital last week and experienced mild hypoglycemia with Lantus 30 units.  -pt was placed on SSI while he was NPO and when he was placed on POs his  lantus was resumed at a lower dose of 25 units, he was to follow up with PCP and did not have hypoglycemia episodes.  3. S/p robotic arm prostatectomy 01/2014 for  prostate cancer  -cont outpatient follow up  4. HTN cont home regimen  5.HCAP/HAP  - fever while in the hospital and  CXR c/w early LLL PNA and pt place on empiric broad spectrum abx  -on follow up he was clinically improved- no SOB and remained afebrile>> he was d/c'ed on augmentin to follow up with PCP.     Consultants:  GI Procedures:  S/p enteroscopy per gi   Discharge Exam: Filed Vitals:   03/03/13 0531  BP: 131/77  Pulse: 92  Temp: 98.2 F (36.8 C)  Resp: 20   Exam:  General: Alert and oriented x3  Cardiovascular: s1,s2 rrr   Respiratory: CTA BL  Abdomen: soft, nt, nd  Musculoskeletal: no LE edema    Discharge Instructions  Discharge Orders   Future Appointments Provider Department Dept Phone   03/18/2013 9:15 AM [name] [facility] [phone]   03/18/2013 9:45 AM Troy Macho, MD [facility] [phone]   04/06/2013 9:15 AM Troy Fee, MD [facility] [phone]   05/23/2013 8:15 AM Sandford Craze, NP [facility] [phone]   Future Orders Complete By Expires   Diet Carb Modified  As directed    Increase activity slowly  As directed        Medication List    STOP taking these medications       ferrous sulfate 325 (65 FE) MG tablet      TAKE these medications       aspirin 81 MG chewable tablet  Chew by mouth daily.     atorvastatin 80 MG tablet  Commonly known as:  LIPITOR  Take 80 mg by mouth daily.     carvedilol 3.125 MG tablet  Commonly known as:  COREG  Take 3.125 mg by mouth 2 (two) times daily with a meal.     fluticasone 50 MCG/ACT nasal spray  Commonly known as:  FLONASE  Place 2 sprays into the nose daily as needed for allergies.     gabapentin 300 MG capsule  Commonly known as:  NEURONTIN  Take 300 mg by mouth at bedtime.     guaiFENesin 600 MG 12 hr tablet  Commonly known as:  MUCINEX  Take 1 tablet (600 mg total) by mouth 2 (two) times daily.     hydrALAZINE 10 MG tablet  Commonly known as:  APRESOLINE  Take 1 tablet (10 mg total) by mouth every 8 (eight) hours.     insulin glargine 100 UNIT/ML injection  Commonly known as:  LANTUS  Inject 10 Units into the skin at bedtime.     insulin lispro 100 UNIT/ML injection  Commonly known as:  HUMALOG  Inject 3 Units into the skin 3 (three) times daily before meals.     lisinopril 40 MG tablet  Commonly known as:  PRINIVIL,ZESTRIL  Take 40 mg by mouth every morning.     nitroGLYCERIN 0.4 MG SL  tablet  Commonly known as:  NITROSTAT  Place 1 tablet (0.4 mg total) under the tongue every 5 (five) minutes as needed for chest pain (up to 3 doses).     pantoprazole 40 MG tablet  Commonly known as:  PROTONIX  Take 1 tablet (40 mg total) by mouth daily.     polyvinyl alcohol 1.4 % ophthalmic solution  Commonly known as:  LIQUIFILM TEARS  Place 1 drop into both eyes daily as needed (dry eyes).  Tessalon PERLES TID PRN Augmentin 827m PO BID No Known Allergies    The results of significant diagnostics from this hospitalization (including imaging, microbiology, ancillary and laboratory) are listed below for reference.    Significant Diagnostic Studies: Dg Chest 2 View  02/22/2013   CLINICAL DATA:  Dizziness, cough.  History of prostate cancer.  EXAM: CHEST  2 VIEW  COMPARISON:  Chest radiograph December 03, 2012  FINDINGS: Cardiomediastinal silhouette is unremarkable. The lungs are clear without pleural effusions or focal consolidations. Pulmonary vasculature is unremarkable. Trachea projects midline and there is no pneumothorax. Soft tissue planes and included osseous structures are nonsuspicious. Mild degenerative change of the thoracic spine.  IMPRESSION: No active cardiopulmonary disease.   Electronically Signed   By: Awilda Metro   On: 02/22/2013 01:20   Ct Angio Chest Pe W/cm &/or Wo Cm  02/22/2013   CLINICAL DATA:  Cough, lightheaded headedness, recent surgery for prostate cancer.  EXAM: CT ANGIOGRAPHY CHEST WITH CONTRAST  TECHNIQUE: Multidetector CT imaging of the chest was performed using the standard protocol during bolus administration of intravenous contrast. Multiplanar CT image reconstructions including MIPs were obtained to evaluate the vascular anatomy.  CONTRAST:  80mL OMNIPAQUE IOHEXOL 350 MG/ML SOLN  COMPARISON:  Chest radiograph February 22, 2013 and CT of the chest November 03, 2012.  FINDINGS: Mildly delayed bolus timing. Main pulmonary artery is not enlarged. No  pulmonary arterial filling defects to the level of the subsegmental arteries. Mild contrast admixture in the lower lobe segment segmental arteries.  Minimal centrilobular emphysema. No solid pulmonary nodules or masses. No focal consolidations or pleural effusions. 3 mm right lower lobe, posterior segment ground-glass nodule is below the level for surveillance recommendations . Mild bronchial wall thickening, most notable in left lower lobe. Tracheobronchial tree is patent and midline, no pneumothorax.  Thoracic aorta is normal in course and caliber, with mild calcific atherosclerosis. The heart and pericardium are unremarkable. No lymphadenopathy by CT size criteria. Thoracic esophagus is nonsuspicious. Included view of the abdomen is unremarkable. Very mild gynecomastia. Mild degenerative change of the included right shoulder. Osseous structures are nonsuspicious.  Review of the MIP images confirms the above findings.  IMPRESSION: No pulmonary arterial embolism.  Minimal bronchial wall thickening may reflect bronchitis, most notable in the left lung base. 3 mm right lower lobe sub solid pulmonary nodule could be reactive.   Electronically Signed   By: Awilda Metro   On: 02/22/2013 01:32   Dg Chest Port 1 View  03/02/2013   CLINICAL DATA:  Fever and leukocytosis  EXAM: PORTABLE CHEST - 1 VIEW  COMPARISON:  02/22/2013  FINDINGS: Subtle infiltrate at the left base, not seen previously. Normal heart size. No effusion or pneumothorax.  IMPRESSION: Question early pneumonia at the left base.   Electronically Signed   By: Tiburcio Pea M.D.   On: 03/02/2013 01:30    Microbiology: Recent Results (from the past 240 hour(s))  URINE CULTURE     Status: None   Collection Time    03/02/13  2:25 AM      Result Value Range Status   Specimen Description URINE, CLEAN CATCH   Final   Special Requests NONE   Final   Culture  Setup Time     Final   Value: 03/02/2013 09:16     Performed at [facility]     Final   Value: >=100,000 COLONIES/ML     Performed at [facility]  Culture     Final   Value: GRAM NEGATIVE RODS     Performed at [facility]   Report Status PENDING   Incomplete     Labs: Basic Metabolic Panel:  Recent Labs Lab 02/28/13 1324 02/28/13 1755 03/01/13 0724 03/03/13 0112  NA 136 135 138 138  K 3.9 3.6 3.7 3.1*  CL 101 102 108 107  CO2 30 24 22 23   GLUCOSE 409* 418* 205* 166*  BUN 24* 28* 19 13  CREATININE 1.2 1.10 1.03 1.20  CALCIUM 8.5 8.7 8.0* 8.0*   Liver Function Tests:  Recent Labs Lab 02/28/13 1324 03/01/13 0724  AST 11 11  ALT 15 9  ALKPHOS 99* 81  BILITOT 0.50 1.0  PROT 5.9* 5.0*  ALBUMIN  --  2.1*   No results found for this basename: LIPASE, AMYLASE,  in the last 168 hours No results found for this basename: AMMONIA,  in the last 168 hours CBC:  Recent Labs Lab 02/28/13 1324 03/01/13 0724  03/02/13 0025 03/02/13 0825 03/02/13 1535 03/03/13 0112 03/03/13 0510  WBC 8.7 7.6  --  18.2*  --   --  11.0*  --   NEUTROABS 6.2  --   --   --   --   --   --   --   HGB 6.8* 6.9*  < > 9.6* 9.2* 8.8* 8.4* 8.9*  HCT 21.1* 20.1*  < > 28.0* 26.7* 25.8* 23.7* 25.3*  MCV 88 87.0  --  84.6  --   --  83.5  --   PLT 285 218  --  234  --   --  227  --   < > = values in this interval not displayed. Cardiac Enzymes: No results found for this basename: CKTOTAL, CKMB, CKMBINDEX, TROPONINI,  in the last 168 hours BNP: BNP (last 3 results) No results found for this basename: PROBNP,  in the last 8760 hours CBG:  Recent Labs Lab 03/02/13 1728 03/02/13 2114 03/02/13 2353 03/03/13 0526 03/03/13 1128  GLUCAP 190* 257* 144* 198* 179*       Signed:  Casmere Hollenbeck C  [facility] 03/03/2013, 2:35 PM

## 2013-03-03 NOTE — Progress Notes (Signed)
Re-checked pt Bp 123/15mmgh. Will continue to monitor.

## 2013-03-03 NOTE — Progress Notes (Signed)
Pt had new order to transfuse blood at around 1am.  [place],RN called clinic to clarify the order since it said "outpatient oncology" underneath order. Receptionist told RN that the covering MD would call back.  Dr Angelique Blonder  called and spoke with Neale Burly, RN and stated that the order should not be carried out because the patient's  Hgb was 8.4 and he had not approved the order. MD also asked if patient was having active bleed and RN responded "no". Neale Burly, RN called NP, Craige Cotta, to clarify order as well. Kirby,NP made new order to d/c transfusion order and recheck H&H at 4am. Will continue to monitor.

## 2013-03-04 ENCOUNTER — Telehealth: Payer: Self-pay | Admitting: Family

## 2013-03-04 LAB — URINE CULTURE: Colony Count: >=100000

## 2013-03-04 MED ORDER — CIPROFLOXACIN HCL 250 MG PO TABS: 250.0000 mg | ORAL_TABLET | Freq: Two times a day (BID) | ORAL | Status: DC

## 2013-03-04 NOTE — Telephone Encounter (Signed)
Reviewed Urine culture.  Note >100k Klebsiella.  Cipro sensitive. Rx sent to pharmacy for cipro ( I did not see that pt was given rx for abx at time of d/c)

## 2013-03-08 ENCOUNTER — Telehealth: Payer: Self-pay | Admitting: Gastroenterology

## 2013-03-08 ENCOUNTER — Emergency Department (HOSPITAL_COMMUNITY)
Admission: EM | Admit: 2013-03-08 | Discharge: 2013-03-08 | Disposition: A | Discharge status: Home / Self Care | Payer: Medicare Other | Attending: Emergency Medicine | Admitting: Emergency Medicine

## 2013-03-08 ENCOUNTER — Other Ambulatory Visit: Payer: Self-pay

## 2013-03-08 ENCOUNTER — Telehealth: Payer: Self-pay

## 2013-03-08 ENCOUNTER — Telehealth: Payer: Self-pay | Admitting: *Deleted

## 2013-03-08 ENCOUNTER — Encounter (HOSPITAL_COMMUNITY): Payer: Self-pay | Admitting: Emergency Medicine

## 2013-03-08 DIAGNOSIS — Z792 Long term (current) use of antibiotics: Secondary | ICD-10-CM | POA: Insufficient documentation

## 2013-03-08 DIAGNOSIS — E1142 Type 2 diabetes mellitus with diabetic polyneuropathy: Secondary | ICD-10-CM | POA: Insufficient documentation

## 2013-03-08 DIAGNOSIS — Z87891 Personal history of nicotine dependence: Secondary | ICD-10-CM | POA: Insufficient documentation

## 2013-03-08 DIAGNOSIS — Z794 Long term (current) use of insulin: Secondary | ICD-10-CM | POA: Insufficient documentation

## 2013-03-08 DIAGNOSIS — D649 Anemia, unspecified: Secondary | ICD-10-CM | POA: Insufficient documentation

## 2013-03-08 DIAGNOSIS — Z8546 Personal history of malignant neoplasm of prostate: Secondary | ICD-10-CM | POA: Insufficient documentation

## 2013-03-08 DIAGNOSIS — K625 Hemorrhage of anus and rectum: Secondary | ICD-10-CM | POA: Insufficient documentation

## 2013-03-08 DIAGNOSIS — Z8673 Personal history of transient ischemic attack (TIA), and cerebral infarction without residual deficits: Secondary | ICD-10-CM | POA: Insufficient documentation

## 2013-03-08 DIAGNOSIS — I129 Hypertensive chronic kidney disease with stage 1 through stage 4 chronic kidney disease, or unspecified chronic kidney disease: Secondary | ICD-10-CM | POA: Insufficient documentation

## 2013-03-08 DIAGNOSIS — G8929 Other chronic pain: Secondary | ICD-10-CM | POA: Insufficient documentation

## 2013-03-08 DIAGNOSIS — I2510 Atherosclerotic heart disease of native coronary artery without angina pectoris: Secondary | ICD-10-CM | POA: Insufficient documentation

## 2013-03-08 DIAGNOSIS — Z860100 Personal history of colon polyps, unspecified: Secondary | ICD-10-CM | POA: Insufficient documentation

## 2013-03-08 DIAGNOSIS — G4730 Sleep apnea, unspecified: Secondary | ICD-10-CM | POA: Insufficient documentation

## 2013-03-08 DIAGNOSIS — E7800 Pure hypercholesterolemia, unspecified: Secondary | ICD-10-CM | POA: Insufficient documentation

## 2013-03-08 DIAGNOSIS — Z862 Personal history of diseases of the blood and blood-forming organs and certain disorders involving the immune mechanism: Secondary | ICD-10-CM | POA: Insufficient documentation

## 2013-03-08 DIAGNOSIS — N183 Chronic kidney disease, stage 3 unspecified: Secondary | ICD-10-CM | POA: Insufficient documentation

## 2013-03-08 DIAGNOSIS — Z79899 Other long term (current) drug therapy: Secondary | ICD-10-CM | POA: Insufficient documentation

## 2013-03-08 DIAGNOSIS — K219 Gastro-esophageal reflux disease without esophagitis: Secondary | ICD-10-CM | POA: Insufficient documentation

## 2013-03-08 DIAGNOSIS — R42 Dizziness and giddiness: Secondary | ICD-10-CM | POA: Insufficient documentation

## 2013-03-08 DIAGNOSIS — E1149 Type 2 diabetes mellitus with other diabetic neurological complication: Secondary | ICD-10-CM | POA: Insufficient documentation

## 2013-03-08 LAB — BASIC METABOLIC PANEL
BUN: 10 mg/dL (ref 6–23)
Chloride: 102 mEq/L (ref 96–112)
Creatinine, Ser: 1.14 mg/dL (ref 0.50–1.35)
GFR calc Af Amer: 76 mL/min — ABNORMAL LOW (ref >90)
GFR calc non Af Amer: 65 mL/min — ABNORMAL LOW (ref >90)
Glucose, Bld: 350 mg/dL — ABNORMAL HIGH (ref 70–99)
Potassium: 3.6 mEq/L (ref 3.5–5.1)

## 2013-03-08 LAB — CBC
HCT: 27.4 % — ABNORMAL LOW (ref 39.0–52.0)
Hemoglobin: 9.3 g/dL — ABNORMAL LOW (ref 13.0–17.0)
MCH: 30 pg (ref 26.0–34.0)
Platelets: 417 10*3/uL — ABNORMAL HIGH (ref 150–400)
RDW: 18.6 % — ABNORMAL HIGH (ref 11.5–15.5)
WBC: 7.2 10*3/uL (ref 4.0–10.5)

## 2013-03-08 LAB — OCCULT BLOOD, POC DEVICE: Fecal Occult Bld: POSITIVE — AB

## 2013-03-08 NOTE — Telephone Encounter (Signed)
FYI Dr Jacobs  

## 2013-03-08 NOTE — ED Notes (Signed)
Pt was recently admitted for rectal bleeding. Pt spoke with PCP about some "dark stool" and was advised to go to ED. Pt denies any pain at this time. Denies any n/v/d or abd pain.

## 2013-03-08 NOTE — Telephone Encounter (Signed)
Pt called stating his rectal (GI) bleeding has returned. He has a headache, dizziness, and feels faint at times. Has tried to get in touch with GI but hasn't heard back as of yet. Reviewed with Dr Myna Hidalgo. To go to ER if he doesn't hear back from GI soon. Pt was instructed by GI to go to [facility] by GI. He stated he will go.

## 2013-03-08 NOTE — Telephone Encounter (Signed)
Pt complains of loose, black stools, has some dizziness, headache that worsened yesterday.

## 2013-03-08 NOTE — Telephone Encounter (Signed)
I spoke with [name] and she advised that the pt needs to got to the ER for evaluation.  Pt was notified and advised NOT to drive himself.  He agreed and will have someone take him now.

## 2013-03-08 NOTE — ED Provider Notes (Signed)
CSN: [id number]     Arrival date & time 03/08/13  1044 History   First MD Initiated Contact with Patient 03/08/13 1234     Chief Complaint  Patient presents with  . Rectal Bleeding   (Consider location/radiation/quality/duration/timing/severity/associated sxs/prior Treatment) HPI She reports he was discharged from the hospital 5 days ago for GI bleeding. He reports he started having black stools the following day. He states he's having black stools twice a day that are loose. He denies any abdominal pain, nausea, vomiting, dizziness. He states if he walks a far distance he gets lightheaded. He denies chest pain, shortness of breath or diaphoresis. He states he's been having problems with GI bleeding and is followed by [facility]. He states he was in the hospital for the same. He reports he had some bleeding blood vessels that they cauterized. He was told if it continued he might need to go to [facility] for further tests.  PCP Dr Peggyann Juba Brooke Dare  Past Medical History  Diagnosis Date  . Hypertension   . High cholesterol   . GERD (gastroesophageal reflux disease)   . GI bleed     a. Recurrent GI bleed, tx with IV iron. b. Per heme notes - likely AVMs 04/2012 (tx with cauterization several months ago).  . Orthostatic hypotension     a. Tx with florinef.  . Hematuria     a. Urology note scan from 07/2012: cystoscopy without evidence for bladder lesion, only lateral hypertrophy of posterior urethra, bladder impression from BPH. b. Pt states he had "some tests" scheduled for later in July 2014.  . Stage III chronic kidney disease     a. Stage 3 (DM with complications ->CKD, peripheral neuropathy).  . Anemia, iron deficiency 03/24/2011    a. Recurrent GI bleed, tx with periodic iron infusions.  . 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 Sleep apnea     "had mask; couldn't sleep in it" (09/20/2012)  . Type II diabetes mellitus     a. Dx 1994, uncontrolled.   . Diabetic peripheral neuropathy 2014     foot pain.  . Chronic lower back pain   . Fatty liver     on CT of 11/2010  . AVM (arteriovenous malformation) of duodenum, acquired     egds in 01/2012, 04/2011  . Polyp, colonic     Colonoscopy 01/2012 "benign" polyp  . CAD (coronary artery disease)     a. Cath 09/2012: moderate borderline CAD in mid LAD/small diagonal branch, mild RCA stenosis, to be managed medically   . BPH (benign prostatic hyperplasia)   . Prostate cancer   . Stroke   . Peripheral neuropathy    Past Surgical History  Procedure Laterality Date  . Shoulder open rotator cuff repair Left 1980's  . Inguinal hernia repair Right 2011  . Lumbar disc surgery  1980's  . Robot assisted laparoscopic radical prostatectomy N/A 01/19/2013    Procedure: ROBOTIC ASSISTED LAPAROSCOPIC RADICAL PROSTATECTOMY;  Surgeon: Valetta Fuller, MD;  Location: [facility];  Service: Urology;  Laterality: N/A;  . Lymphadenectomy Bilateral 01/19/2013    Procedure: LYMPHADENECTOMY;  Surgeon: Valetta Fuller, MD;  Location: [facility] ORS;  Service: Urology;  Laterality: Bilateral;  . Enteroscopy N/A 03/01/2013    Procedure: ENTEROSCOPY;  Surgeon: Louis Meckel, MD;  Location: [facility] ENDOSCOPY;  Service: Endoscopy;  Laterality: N/A;   Family History  Problem Relation Age of Onset  . Heart attack Brother     Died at 47  .  Stroke Father     Died at 71  . Diabetes Sister   . Diabetes Mother   . Stomach cancer Brother   . Heart attack Sister    History  Substance Use Topics  . Smoking status: Former Smoker -- 1.00 packs/day for 48 years    Types: Cigarettes    Quit date: 03/17/2012  . Smokeless tobacco: Never Used  . Alcohol Use: No     Comment: 09/20/2012 "Used to; stopped ~ 2009; never had problem w/it"  retired  Review of Systems  All other systems reviewed and are negative.    Allergies  Review of patient's allergies indicates no known allergies.  Home Medications   Current Outpatient Rx  Name  Route  Sig  Dispense  Refill  .  amoxicillin-clavulanate (AUGMENTIN) 875-125 MG per tablet   Oral   Take 1 tablet by mouth 2 (two) times daily.   14 tablet   0   . atorvastatin (LIPITOR) 80 MG tablet   Oral   Take 80 mg by mouth daily.         . benzonatate (TESSALON PERLES) 100 MG capsule   Oral   Take 2 capsules (200 mg total) by mouth 3 (three) times daily as needed for cough.   20 capsule   0   . carvedilol (COREG) 3.125 MG tablet   Oral   Take 3.125 mg by mouth 2 (two) times daily with a meal.         . ciprofloxacin (CIPRO) 250 MG tablet   Oral   Take 1 tablet (250 mg total) by mouth 2 (two) times daily.   14 tablet   0   . fluticasone (FLONASE) 50 MCG/ACT nasal spray   Nasal   Place 2 sprays into the nose daily as needed for allergies.          [name] Kitchen gabapentin (NEURONTIN) 300 MG capsule   Oral   Take 300 mg by mouth at bedtime.         [name] Kitchen guaiFENesin (MUCINEX) 600 MG 12 hr tablet   Oral   Take 1 tablet (600 mg total) by mouth 2 (two) times daily.   14 tablet   0   . hydrALAZINE (APRESOLINE) 10 MG tablet   Oral   Take 1 tablet (10 mg total) by mouth every 8 (eight) hours.   90 tablet   1   . insulin glargine (LANTUS) 100 UNIT/ML injection   Subcutaneous   Inject 10 Units into the skin at bedtime.          . insulin lispro (HUMALOG) 100 UNIT/ML injection   Subcutaneous   Inject 3 Units into the skin 3 (three) times daily before meals.          [name] Kitchen lisinopril (PRINIVIL,ZESTRIL) 40 MG tablet   Oral   Take 40 mg by mouth every morning.         . pantoprazole (PROTONIX) 40 MG tablet   Oral   Take 1 tablet (40 mg total) by mouth daily.   30 tablet   3   . polyvinyl alcohol (LIQUIFILM TEARS) 1.4 % ophthalmic solution   Both Eyes   Place 1 drop into both eyes daily as needed (dry eyes).         . nitroGLYCERIN (NITROSTAT) 0.4 MG SL tablet   Sublingual   Place 1 tablet (0.4 mg total) under the tongue every 5 (five) minutes as needed for chest pain (up to 3 doses).  25  tablet   2     IMPORTANT: Do not take this medication if you have ...    BP 156/81  Pulse 100  Temp(Src) 98.4 F (36.9 C) (Oral)  Ht 5\' 7"  (1.702 m)  Wt 160 lb (72.576 kg)  BMI 25.05 kg/m2  SpO2 100%  Vital signs normal   Physical Exam  Nursing note and vitals reviewed. Constitutional: He is oriented to person, place, and time.  Non-toxic appearance. He does not appear ill. No distress.  Thin male  HENT:  Head: Normocephalic and atraumatic.  Right Ear: External ear normal.  Left Ear: External ear normal.  Nose: Nose normal. No mucosal edema or rhinorrhea.  Mouth/Throat: Oropharynx is clear and moist and mucous membranes are normal. No dental abscesses or uvula swelling.  Eyes: Conjunctivae and EOM are normal. Pupils are equal, round, and reactive to light.  Neck: Normal range of motion and full passive range of motion without pain. Neck supple.  Cardiovascular: Normal rate, regular rhythm and normal heart sounds.  Exam reveals no gallop and no friction rub.   No murmur heard. Pulmonary/Chest: Effort normal and breath sounds normal. No respiratory distress. He has no wheezes. He has no rhonchi. He has no rales. He exhibits no tenderness and no crepitus.  Abdominal: Soft. Normal appearance and bowel sounds are normal. He exhibits no distension. There is no tenderness. There is no rebound and no guarding.  Musculoskeletal: Normal range of motion. He exhibits no edema and no tenderness.  Moves all extremities well.   Neurological: He is alert and oriented to person, place, and time. He has normal strength. No cranial nerve deficit.  Skin: Skin is warm, dry and intact. No rash noted. No erythema. No pallor.  Psychiatric: He has a normal mood and affect. His speech is normal and behavior is normal. His mood appears not anxious.    ED Course  Procedures (including critical care time)  14:05 [facility] will talk to her attending and call me back.  14:12 Shanda Bumps has talked  to Dr Arlyce Dice, states patient is supposed to be on iron pills. His Hb is stable. They are going to arrange for him to get an outpatient Hb in 3 days, Dec 26.  They are also going to arrange for him to be seen at [facility] to get a double balloon enteroscopy done.    PT given discharge instructions. States he has iron pills at home, he states the hospitalist told him to stop his iron pills.   Progress Notes by Louis Meckel, MD at 03/01/2013 4:27 PM    Author: Louis Meckel, MD Service: Gastroenterology Author Type: Physician   Filed: 03/01/2013 4:28 PM Note Time: 03/01/2013 4:27 PM Status: Signed   Editor: Louis Meckel, MD (Physician)      Enteroscopy demonstrated multiple bleeding AVMs in the jejunum and duodenum. These were cauterized with the argon plasma coagulator.  Recommendations  #1 continue iron supplementation  #2 if patient drops hemoglobin despite iron supplementation would proceed with double balloon enteroscopy at the intention of treating any AVMs        Labs Review Results for orders placed during the hospital encounter of 03/08/13  CBC      Result Value Range   WBC 7.2  4.0 - 10.5 K/uL   RBC 3.10 (*) 4.22 - 5.81 MIL/uL   Hemoglobin 9.3 (*) 13.0 - 17.0 g/dL   HCT 09.6 (*) 04.5 - 40.9 %   MCV 88.4  78.0 - 100.0 fL   MCH 30.0  26.0 - 34.0 pg   MCHC 33.9  30.0 - 36.0 g/dL   RDW 86.5 (*) 78.4 - 69.6 %   Platelets 417 (*) 150 - 400 K/uL  BASIC METABOLIC PANEL      Result Value Range   Sodium 136  135 - 145 mEq/L   Potassium 3.6  3.5 - 5.1 mEq/L   Chloride 102  96 - 112 mEq/L   CO2 27  19 - 32 mEq/L   Glucose, Bld 350 (*) 70 - 99 mg/dL   BUN 10  6 - 23 mg/dL   Creatinine, Ser 2.95  0.50 - 1.35 mg/dL   Calcium 8.6  8.4 - 28.4 mg/dL   GFR calc non Af Amer 65 (*) >90 mL/min   GFR calc Af Amer 76 (*) >90 mL/min  OCCULT BLOOD, POC DEVICE      Result Value Range   Fecal Occult Bld POSITIVE (*) NEGATIVE   Laboratory interpretation all normal except stable  anemia   Imaging Review No results found.  EKG Interpretation   None       MDM   1. Rectal bleeding   2. Anemia    Plan discharge   Devoria Albe, MD, Franz Dell, MD 03/08/13 [phone]

## 2013-03-08 NOTE — Telephone Encounter (Signed)
Pt was seen in the ER. Per Dr. Arlyce Dice there is nothing more he can do for the pt and his Hgb is better than it was last week. States pt is stable and pt was sent home. Pt is to come to the lab Friday for a CBC, order in epic. Referral made to [facility] for a double balloon enteroscopy per Dr. Arlyce Dice. Records and referral faxed to [facility] digestive health to review records. Pt may need to be seen for OV first prior to procedure. Pt was instructed to go to [facility] ER if he has more problems so that they could take care of his problem there and do procedure needed.

## 2013-03-09 ENCOUNTER — Ambulatory Visit: Payer: Medicare Other | Admitting: Family

## 2013-03-09 NOTE — Telephone Encounter (Signed)
Please call pt and let him know that urine culture grew bacteria from the hospital. I would like him to start cipro if he has not already.

## 2013-03-09 NOTE — Telephone Encounter (Signed)
Notified pt, he is currently on augmentin for pneumonia. Per verbal from Provider, pt should take cipro in addition to Augmentin to treat the UTI.  Pt voices understanding.

## 2013-03-09 NOTE — Telephone Encounter (Signed)
i agree, this is the same decision made by his [facility] docs before switching to me this past fall (tertiary referral for double balloon testing)

## 2013-03-11 ENCOUNTER — Other Ambulatory Visit (INDEPENDENT_AMBULATORY_CARE_PROVIDER_SITE_OTHER): Payer: Medicare Other

## 2013-03-11 DIAGNOSIS — D649 Anemia, unspecified: Secondary | ICD-10-CM

## 2013-03-11 LAB — CBC WITH DIFFERENTIAL/PLATELET
Basophils Relative: 0.7 % (ref 0.0–3.0)
Eosinophils Absolute: 0.1 10*3/uL (ref 0.0–0.7)
Eosinophils Relative: 2.3 % (ref 0.0–5.0)
HCT: 30.3 % — ABNORMAL LOW (ref 39.0–52.0)
Hemoglobin: 10.1 g/dL — ABNORMAL LOW (ref 13.0–17.0)
Lymphocytes Relative: 13.5 % (ref 12.0–46.0)
Lymphs Abs: 0.9 10*3/uL (ref 0.7–4.0)
MCHC: 33.3 g/dL (ref 30.0–36.0)
MCV: 90.3 fl (ref 78.0–100.0)
Monocytes Absolute: 0.7 10*3/uL (ref 0.1–1.0)
Neutro Abs: 4.7 10*3/uL (ref 1.4–7.7)
RBC: 3.35 Mil/uL — ABNORMAL LOW (ref 4.22–5.81)
RDW: 20.7 % — ABNORMAL HIGH (ref 11.5–14.6)

## 2013-03-14 ENCOUNTER — Telehealth: Payer: Self-pay | Admitting: Gastroenterology

## 2013-03-14 NOTE — Telephone Encounter (Signed)
Pt is aware that referral has been made and will have the capsule endo sent on a disc with a color report

## 2013-03-18 ENCOUNTER — Ambulatory Visit (HOSPITAL_BASED_OUTPATIENT_CLINIC_OR_DEPARTMENT_OTHER): Payer: Medicare Other | Admitting: Hematology & Oncology

## 2013-03-18 ENCOUNTER — Other Ambulatory Visit (HOSPITAL_BASED_OUTPATIENT_CLINIC_OR_DEPARTMENT_OTHER): Payer: Medicare Other | Admitting: Lab

## 2013-03-18 ENCOUNTER — Ambulatory Visit (HOSPITAL_BASED_OUTPATIENT_CLINIC_OR_DEPARTMENT_OTHER): Payer: Medicare Other

## 2013-03-18 ENCOUNTER — Other Ambulatory Visit: Payer: Self-pay | Admitting: *Deleted

## 2013-03-18 VITALS — BP 138/59 | HR 103 | Temp 98.0°F | Resp 18 | Ht 67.0 in | Wt 161.0 lb

## 2013-03-18 DIAGNOSIS — IMO0002 Reserved for concepts with insufficient information to code with codable children: Secondary | ICD-10-CM

## 2013-03-18 DIAGNOSIS — D509 Iron deficiency anemia, unspecified: Secondary | ICD-10-CM

## 2013-03-18 DIAGNOSIS — N039 Chronic nephritic syndrome with unspecified morphologic changes: Secondary | ICD-10-CM

## 2013-03-18 DIAGNOSIS — N189 Chronic kidney disease, unspecified: Secondary | ICD-10-CM

## 2013-03-18 DIAGNOSIS — D631 Anemia in chronic kidney disease: Secondary | ICD-10-CM

## 2013-03-18 DIAGNOSIS — E1165 Type 2 diabetes mellitus with hyperglycemia: Secondary | ICD-10-CM

## 2013-03-18 LAB — CMP (CANCER CENTER ONLY)
ALK PHOS: 96 U/L — AB (ref 26–84)
ALT: 15 U/L (ref 10–47)
AST: 15 U/L (ref 11–38)
Albumin: 2.3 g/dL — ABNORMAL LOW (ref 3.3–5.5)
BUN, Bld: 12 mg/dL (ref 7–22)
CO2: 31 mEq/L (ref 18–33)
Calcium: 8.6 mg/dL (ref 8.0–10.3)
Chloride: 107 mEq/L (ref 98–108)
Creat: 1 mg/dl (ref 0.6–1.2)
Glucose, Bld: 120 mg/dL — ABNORMAL HIGH (ref 73–118)
Potassium: 3.5 mEq/L (ref 3.3–4.7)
SODIUM: 143 meq/L (ref 128–145)
TOTAL PROTEIN: 5.5 g/dL — AB (ref 6.4–8.1)
Total Bilirubin: 0.6 mg/dl (ref 0.20–1.60)

## 2013-03-18 LAB — CBC WITH DIFFERENTIAL (CANCER CENTER ONLY)
BASO#: 0 10*3/uL (ref 0.0–0.2)
BASO%: 0.6 % (ref 0.0–2.0)
EOS ABS: 0.2 10*3/uL (ref 0.0–0.5)
EOS%: 3.2 % (ref 0.0–7.0)
HEMATOCRIT: 27.1 % — AB (ref 38.7–49.9)
HGB: 8.8 g/dL — ABNORMAL LOW (ref 13.0–17.1)
LYMPH#: 0.8 10*3/uL — AB (ref 0.9–3.3)
LYMPH%: 17 % (ref 14.0–48.0)
MCH: 30.2 pg (ref 28.0–33.4)
MCHC: 32.5 g/dL (ref 32.0–35.9)
MCV: 93 fL (ref 82–98)
MONO#: 0.5 10*3/uL (ref 0.1–0.9)
MONO%: 11.5 % (ref 0.0–13.0)
NEUT#: 3.2 10*3/uL (ref 1.5–6.5)
NEUT%: 67.7 % (ref 40.0–80.0)
Platelets: 380 10*3/uL (ref 145–400)
RBC: 2.91 10*6/uL — AB (ref 4.20–5.70)
RDW: 16.6 % — AB (ref 11.1–15.7)
WBC: 4.7 10*3/uL (ref 4.0–10.0)

## 2013-03-18 LAB — FERRITIN CHCC: FERRITIN: 141 ng/mL (ref 22–316)

## 2013-03-18 LAB — IRON AND TIBC CHCC
%SAT: 15 % — AB (ref 20–55)
IRON: 33 ug/dL — AB (ref 42–163)
TIBC: 219 ug/dL (ref 202–409)
UIBC: 186 ug/dL (ref 117–376)

## 2013-03-18 LAB — RETICULOCYTES (CHCC)
ABS RETIC: 220.3 10*3/uL — AB (ref 19.0–186.0)
RBC.: 3.06 MIL/uL — ABNORMAL LOW (ref 4.22–5.81)
Retic Ct Pct: 7.2 % — ABNORMAL HIGH (ref 0.4–2.3)

## 2013-03-18 LAB — CHCC SATELLITE - SMEAR

## 2013-03-18 MED ORDER — SODIUM CHLORIDE 0.9 % IV SOLN
1020.0000 mg | Freq: Once | INTRAVENOUS | Status: AC
  Administered 2013-03-18: 1020 mg via INTRAVENOUS
  Filled 2013-03-18: qty 34

## 2013-03-18 MED ORDER — DARBEPOETIN ALFA-POLYSORBATE 300 MCG/0.6ML IJ SOLN
INTRAMUSCULAR | Status: AC
  Filled 2013-03-18: qty 0.6

## 2013-03-18 MED ORDER — SODIUM CHLORIDE 0.9 % IV SOLN
Freq: Once | INTRAVENOUS | Status: AC
  Administered 2013-03-18: 11:00:00 via INTRAVENOUS

## 2013-03-18 MED ORDER — DARBEPOETIN ALFA-POLYSORBATE 300 MCG/0.6ML IJ SOLN
300.0000 ug | Freq: Once | INTRAMUSCULAR | Status: AC
  Administered 2013-03-18: 300 ug via SUBCUTANEOUS

## 2013-03-18 NOTE — Patient Instructions (Signed)
Ferumoxytol injection What is this medicine? FERUMOXYTOL is an iron complex. Iron is used to make healthy red blood cells, which carry oxygen and nutrients throughout the body. This medicine is used to treat iron deficiency anemia in people with chronic kidney disease. This medicine may be used for other purposes; ask your health care provider or pharmacist if you have questions. COMMON BRAND NAME(S): Feraheme  What should I tell my health care provider before I take this medicine? They need to know if you have any of these conditions: -anemia not caused by low iron levels -high levels of iron in the blood -magnetic resonance imaging (MRI) test scheduled -an unusual or allergic reaction to iron, other medicines, foods, dyes, or preservatives -pregnant or trying to get pregnant -breast-feeding How should I use this medicine? This medicine is for injection into a vein. It is given by a health care professional in a hospital or clinic setting. Talk to your pediatrician regarding the use of this medicine in children. Special care may be needed. Overdosage: If you think you've taken too much of this medicine contact a poison control center or emergency room at once. Overdosage: If you think you have taken too much of this medicine contact a poison control center or emergency room at once. NOTE: This medicine is only for you. Do not share this medicine with others. What if I miss a dose? It is important not to miss your dose. Call your doctor or health care professional if you are unable to keep an appointment. What may interact with this medicine? This medicine may interact with the following medications: -other iron products This list may not describe all possible interactions. Give your health care provider a list of all the medicines, herbs, non-prescription drugs, or dietary supplements you use. Also tell them if you smoke, drink alcohol, or use illegal drugs. Some items may interact with your  medicine. What should I watch for while using this medicine? Visit your doctor or healthcare professional regularly. Tell your doctor or healthcare professional if your symptoms do not start to get better or if they get worse. You may need blood work done while you are taking this medicine. You may need to follow a special diet. Talk to your doctor. Foods that contain iron include: whole grains/cereals, dried fruits, beans, or peas, leafy green vegetables, and organ meats (liver, kidney). What side effects may I notice from receiving this medicine? Side effects that you should report to your doctor or health care professional as soon as possible: -allergic reactions like skin rash, itching or hives, swelling of the face, lips, or tongue -breathing problems -changes in blood pressure -feeling faint or lightheaded, falls -fever or chills -flushing, sweating, or hot feelings -swelling of the ankles or feet Side effects that usually do not require medical attention (Report these to your doctor or health care professional if they continue or are bothersome.): -diarrhea -headache -nausea, vomiting -stomach pain This list may not describe all possible side effects. Call your doctor for medical advice about side effects. You may report side effects to FDA at 1-800-FDA-1088. Where should I keep my medicine? This drug is given in a hospital or clinic and will not be stored at home. NOTE: This sheet is a summary. It may not cover all possible information. If you have questions about this medicine, talk to your doctor, pharmacist, or health care provider.  2014, Elsevier/Gold Standard. (2011-10-17 15:23:36) Darbepoetin Alfa injection What is this medicine? DARBEPOETIN ALFA (dar be POE e   tin AL fa) helps your body make more red blood cells. It is used to treat anemia caused by chronic kidney failure and chemotherapy. This medicine may be used for other purposes; ask your health care provider or  pharmacist if you have questions. COMMON BRAND NAME(S): Aranesp What should I tell my health care provider before I take this medicine? They need to know if you have any of these conditions: -blood clotting disorders or history of blood clots -cancer patient not on chemotherapy -cystic fibrosis -heart disease, such as angina, heart failure, or a history of a heart attack -hemoglobin level of 12 g/dL or greater -high blood pressure -low levels of folate, iron, or vitamin B12 -seizures -an unusual or allergic reaction to darbepoetin, erythropoietin, albumin, hamster proteins, latex, other medicines, foods, dyes, or preservatives -pregnant or trying to get pregnant -breast-feeding How should I use this medicine? This medicine is for injection into a vein or under the skin. It is usually given by a health care professional in a hospital or clinic setting. If you get this medicine at home, you will be taught how to prepare and give this medicine. Do not shake the solution before you withdraw a dose. Use exactly as directed. Take your medicine at regular intervals. Do not take your medicine more often than directed. It is important that you put your used needles and syringes in a special sharps container. Do not put them in a trash can. If you do not have a sharps container, call your pharmacist or healthcare provider to get one. Talk to your pediatrician regarding the use of this medicine in children. While this medicine may be used in children as young as 1 year for selected conditions, precautions do apply. Overdosage: If you think you have taken too much of this medicine contact a poison control center or emergency room at once. NOTE: This medicine is only for you. Do not share this medicine with others. What if I miss a dose? If you miss a dose, take it as soon as you can. If it is almost time for your next dose, take only that dose. Do not take double or extra doses. What may interact with  this medicine? Do not take this medicine with any of the following medications: -epoetin alfa This list may not describe all possible interactions. Give your health care provider a list of all the medicines, herbs, non-prescription drugs, or dietary supplements you use. Also tell them if you smoke, drink alcohol, or use illegal drugs. Some items may interact with your medicine. What should I watch for while using this medicine? Visit your prescriber or health care professional for regular checks on your progress and for the needed blood tests and blood pressure measurements. It is especially important for the doctor to make sure your hemoglobin level is in the desired range, to limit the risk of potential side effects and to give you the best benefit. Keep all appointments for any recommended tests. Check your blood pressure as directed. Ask your doctor what your blood pressure should be and when you should contact him or her. As your body makes more red blood cells, you may need to take iron, folic acid, or vitamin B supplements. Ask your doctor or health care provider which products are right for you. If you have kidney disease continue dietary restrictions, even though this medication can make you feel better. Talk with your doctor or health care professional about the foods you eat and the vitamins that you   take. What side effects may I notice from receiving this medicine? Side effects that you should report to your doctor or health care professional as soon as possible: -allergic reactions like skin rash, itching or hives, swelling of the face, lips, or tongue -breathing problems -changes in vision -chest pain -confusion, trouble speaking or understanding -feeling faint or lightheaded, falls -high blood pressure -muscle aches or pains -pain, swelling, warmth in the leg -rapid weight gain -severe headaches -sudden numbness or weakness of the face, arm or leg -trouble walking, dizziness, loss  of balance or coordination -seizures (convulsions) -swelling of the ankles, feet, hands -unusually weak or tired Side effects that usually do not require medical attention (report to your doctor or health care professional if they continue or are bothersome): -diarrhea -fever, chills (flu-like symptoms) -headaches -nausea, vomiting -redness, stinging, or swelling at site where injected This list may not describe all possible side effects. Call your doctor for medical advice about side effects. You may report side effects to FDA at 1-800-FDA-1088. Where should I keep my medicine? Keep out of the reach of children. Store in a refrigerator between 2 and 8 degrees C (36 and 46 degrees F). Do not freeze. Do not shake. Throw away any unused portion if using a single-dose vial. Throw away any unused medicine after the expiration date. NOTE: This sheet is a summary. It may not cover all possible information. If you have questions about this medicine, talk to your doctor, pharmacist, or health care provider.  2014, Elsevier/Gold Standard. (2008-02-15 10:23:57)  

## 2013-03-18 NOTE — Progress Notes (Signed)
This office note has been dictated.

## 2013-03-19 NOTE — Progress Notes (Signed)
DIAGNOSES: 1. Recurrent gastrointestinal bleeding leading to iron-deficiency     anemia. 2. History of stage I prostate cancer-prostatectomy. 3. Poorly-controlled insulin-dependent diabetes.  CURRENT THERAPY: 1. IV iron as indicated-the patient to receive a dose today. 2. Aranesp 300 mcg as needed for hemoglobin less than 10.  INTERIM HISTORY:  [name] comes in for a followup.  He has been in the hospital twice already.  He has had a GI bleeding.  He is going to be sent out to [facility] to have a special small-bowel endoscopy. He is not sure when this is going to be set up.  He has been transfused.  I think he has had 3 units in the hospital.  His last I think Aranesp that we gave him was back in July.  Last iron that we gave him was 3 weeks ago.  Again, he says he still has some dark stools, but these are "lightening up."  He has had no abdominal pain.  He has had no nausea.  He has had no leg swelling.  PHYSICAL EXAMINATION:  General:  This is a fairly well-developed, well- nourished, black gentleman in no obvious distress.  Vital Signs: Temperature of 98, pulse 103, respiratory rate 18, blood pressure 138/59, weight is 161 pounds.  Head and Neck:  Normocephalic, atraumatic skull.  There are no ocular or oral lesions.  There are no palpable cervical or supraclavicular lymph nodes.  Lungs:  Clear bilaterally. Cardiac:  Regular rate and rhythm with a normal S1, S2.  There are no murmurs, rubs, or bruits.  Abdomen:  Soft.  He has good bowel sounds. There is no fluid wave.  There is no palpable abdominal mass.  There is no palpable hepatosplenomegaly.  Extremities:  Show no clubbing, cyanosis, or edema.  LABORATORY STUDIES:  White cell count is 4.7, hemoglobin 8.8, hematocrit 27.1, platelet count 380.  MCV is 93.  IMPRESSION:  [name] is a 68 year old gentleman with recurrent gastrointestinal bleeding.  Again, it is unclear as to where this is all coming from.  One  has to see his small bowel problem.  One also has wonder if he may not need a surgery for this if he continued to have bleeding.  We will go ahead and give iron today.  I will also go ahead and give him a dose of Aranesp.  His erythropoietin level is only 56, so he clearly should benefit from Aranesp.  I will plan to see him back in another month.  Hopefully by then, he would have been seen by Gastroenterology at [facility].  I keep telling him about his diabetes.  I am not sure if he really is checking this at home all that much.    ______________________________ Volanda Napoleon, M.D. PRE/MEDQ  D:  03/18/2013  T:  03/19/2013  Job:  [id number]

## 2013-03-21 ENCOUNTER — Telehealth: Payer: Self-pay | Admitting: Gastroenterology

## 2013-03-21 ENCOUNTER — Telehealth: Payer: Self-pay | Admitting: Family

## 2013-03-21 NOTE — Telephone Encounter (Signed)
Scheduled appointment for 03/23/13

## 2013-03-21 NOTE — Telephone Encounter (Signed)
Please call pt and arrange a 2 week hospital follow up.  

## 2013-03-21 NOTE — Telephone Encounter (Signed)
Pt did receive a call back from [facility] and is awaiting review of the information and Mina Marble will call pt with an appt once the review is complete

## 2013-03-22 ENCOUNTER — Telehealth: Payer: Self-pay | Admitting: Gastroenterology

## 2013-03-22 MED ORDER — ESOMEPRAZOLE MAGNESIUM 40 MG PO CPDR: 40.0000 mg | DELAYED_RELEASE_CAPSULE | Freq: Every day | ORAL | Status: DC

## 2013-03-22 NOTE — Telephone Encounter (Signed)
nexium works better for the pt and would like that sent to the pharmacy.

## 2013-03-23 ENCOUNTER — Telehealth: Payer: Self-pay | Admitting: Family

## 2013-03-23 ENCOUNTER — Ambulatory Visit (HOSPITAL_BASED_OUTPATIENT_CLINIC_OR_DEPARTMENT_OTHER)
Admission: RE | Admit: 2013-03-23 | Discharge: 2013-03-23 | Disposition: A | Discharge status: Home / Self Care | Payer: Medicare Other | Source: Ambulatory Visit | Attending: Family | Admitting: Family

## 2013-03-23 ENCOUNTER — Ambulatory Visit (INDEPENDENT_AMBULATORY_CARE_PROVIDER_SITE_OTHER): Payer: Medicare Other | Admitting: Family

## 2013-03-23 ENCOUNTER — Encounter: Payer: Self-pay | Admitting: Family

## 2013-03-23 ENCOUNTER — Telehealth: Payer: Self-pay | Admitting: *Deleted

## 2013-03-23 VITALS — BP 150/82 | HR 107 | Temp 97.5°F | Resp 16 | Ht 67.0 in | Wt 157.1 lb

## 2013-03-23 DIAGNOSIS — R200 Anesthesia of skin: Secondary | ICD-10-CM

## 2013-03-23 DIAGNOSIS — IMO0002 Reserved for concepts with insufficient information to code with codable children: Secondary | ICD-10-CM

## 2013-03-23 DIAGNOSIS — G319 Degenerative disease of nervous system, unspecified: Secondary | ICD-10-CM | POA: Insufficient documentation

## 2013-03-23 DIAGNOSIS — R0989 Other specified symptoms and signs involving the circulatory and respiratory systems: Secondary | ICD-10-CM | POA: Insufficient documentation

## 2013-03-23 DIAGNOSIS — I6789 Other cerebrovascular disease: Secondary | ICD-10-CM | POA: Insufficient documentation

## 2013-03-23 DIAGNOSIS — R05 Cough: Secondary | ICD-10-CM | POA: Insufficient documentation

## 2013-03-23 DIAGNOSIS — J189 Pneumonia, unspecified organism: Secondary | ICD-10-CM

## 2013-03-23 DIAGNOSIS — R202 Paresthesia of skin: Secondary | ICD-10-CM

## 2013-03-23 DIAGNOSIS — R059 Cough, unspecified: Secondary | ICD-10-CM | POA: Insufficient documentation

## 2013-03-23 DIAGNOSIS — K922 Gastrointestinal hemorrhage, unspecified: Secondary | ICD-10-CM

## 2013-03-23 DIAGNOSIS — R51 Headache: Secondary | ICD-10-CM

## 2013-03-23 DIAGNOSIS — E119 Type 2 diabetes mellitus without complications: Secondary | ICD-10-CM

## 2013-03-23 DIAGNOSIS — R42 Dizziness and giddiness: Secondary | ICD-10-CM | POA: Insufficient documentation

## 2013-03-23 DIAGNOSIS — E1165 Type 2 diabetes mellitus with hyperglycemia: Secondary | ICD-10-CM

## 2013-03-23 DIAGNOSIS — R209 Unspecified disturbances of skin sensation: Secondary | ICD-10-CM | POA: Insufficient documentation

## 2013-03-23 DIAGNOSIS — D649 Anemia, unspecified: Secondary | ICD-10-CM

## 2013-03-23 DIAGNOSIS — K219 Gastro-esophageal reflux disease without esophagitis: Secondary | ICD-10-CM

## 2013-03-23 DIAGNOSIS — IMO0001 Reserved for inherently not codable concepts without codable children: Secondary | ICD-10-CM

## 2013-03-23 LAB — CBC WITH DIFFERENTIAL/PLATELET
BASOS ABS: 0 10*3/uL (ref 0.0–0.1)
Basophils Relative: 0 % (ref 0–1)
Eosinophils Absolute: 0.1 10*3/uL (ref 0.0–0.7)
Eosinophils Relative: 2 % (ref 0–5)
HEMATOCRIT: 32.1 % — AB (ref 39.0–52.0)
Hemoglobin: 10.5 g/dL — ABNORMAL LOW (ref 13.0–17.0)
LYMPHS PCT: 19 % (ref 12–46)
Lymphs Abs: 1.4 10*3/uL (ref 0.7–4.0)
MCH: 30.1 pg (ref 26.0–34.0)
MCHC: 32.7 g/dL (ref 30.0–36.0)
MCV: 92 fL (ref 78.0–100.0)
MONO ABS: 0.9 10*3/uL (ref 0.1–1.0)
Monocytes Relative: 12 % (ref 3–12)
NEUTROS ABS: 4.8 10*3/uL (ref 1.7–7.7)
Neutrophils Relative %: 67 % (ref 43–77)
Platelets: 382 10*3/uL (ref 150–400)
RBC: 3.49 MIL/uL — AB (ref 4.22–5.81)
RDW: 18.9 % — AB (ref 11.5–15.5)
WBC: 7.2 10*3/uL (ref 4.0–10.5)

## 2013-03-23 NOTE — Telephone Encounter (Signed)
Notified pt. 

## 2013-03-23 NOTE — Progress Notes (Signed)
Pre visit review using our clinic review tool, if applicable. No additional management support is needed unless otherwise documented below in the visit note. 

## 2013-03-23 NOTE — Telephone Encounter (Signed)
Received call from [facility] with stat lab from pt's CBC/diff from today.  Please advise.

## 2013-03-23 NOTE — Telephone Encounter (Signed)
Received fax from [place] [facility] for a compounded topical cream for neuropathy.  Verified with pt that he is not requesting this rx. Faxed denial to [phone].

## 2013-03-23 NOTE — Telephone Encounter (Signed)
Pt being seen today for follow-up.

## 2013-03-23 NOTE — Patient Instructions (Signed)
Please call to schedule follow up with Dr. Dwyane Dee for your diabetes.   Complete lab work prior to leaving. Complete head CT and Chest x ray on the first floor. Keep your upcoming appointment with Dr. Ardis Hughs. Follow up in 2 weeks.

## 2013-03-23 NOTE — Progress Notes (Signed)
Subjective:    Patient ID: [name], male    DOB: [birth year], 68 y.o.   MRN: [id number]  HPI  [name] is a 68 yr old male who presents today for hospital follow up.  He was hospitalized for 5 days 12/15-12/18- due to GI bleed.  He was sent the ED by Dr.Ennever upon finding low hemoglobin of 6.8.  He received 3 units of blood during that hospitalization and gi was consulted. Enteroscopy noted multiple bleeding AVM's in the duodenum and jejunum and these lesions were cauterized.  At time of discharge his hemoglobin was 8.9. Reports that he continues to feel weak but not more so than when he left the hospital.  Reports stools most recently have been brown in color. No blood.  DM2-  He apparently developed hypoglycemia while on lantus 30 units in the hospital and lantus was decreased to 25 units.    Pneumonia- had fever while hospitalized and had CXR performed while in hospital which noted early LLL pneumonia.  He was discharged to home on augmentin after being treated with broad spectrum abx as an inpatient. Pt completed the abs. Does report SOB.  Reports SOB even at rest.  He was not SOB at the time of his hospital discharge.   He returned to the ED on 12/23 with report of black stools. Hemoglobin in the ED was 9.3. He was found to be heme +. He has apt with GI on 1/21.  GERD- He reports + GERD symptoms on protonix. Has tried tums without improvement in his symptoms.  Reports that at night he feels "choked" from the GERD.   Headache- reports that he has been having "bad headaches" for 1-2 weeks.  Describes throbbing pain which is "all over" his head. Reports some left thumb numbness which started "a couple of weeks ago."    DM2- reports sugars are running in the 200's. He is using 10 units of lantus.  He does report an episode of hypoglycemia (60) which occurred around lunch time.  He uses a sliding scale for humalog.    Review of Systems See HPI  Past Medical History  Diagnosis Date  .  Hypertension   . High cholesterol   . GERD (gastroesophageal reflux disease)   . GI bleed     a. Recurrent GI bleed, tx with IV iron. b. Per heme notes - likely AVMs 04/2012 (tx with cauterization several months ago).  . Orthostatic hypotension     a. Tx with florinef.  . Hematuria     a. Urology note scan from 07/2012: cystoscopy without evidence for bladder lesion, only lateral hypertrophy of posterior urethra, bladder impression from BPH. b. Pt states he had "some tests" scheduled for later in July 2014.  . Stage III chronic kidney disease     a. Stage 3 (DM with complications ->CKD, peripheral neuropathy).  . Anemia, iron deficiency 03/24/2011    a. Recurrent GI bleed, tx with periodic iron infusions.  . 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 Sleep apnea     "had mask; couldn't sleep in it" (09/20/2012)  . Type II diabetes mellitus     a. Dx 1994, uncontrolled.   . Diabetic peripheral neuropathy 2014    foot pain.  . Chronic lower back pain   . Fatty liver     on CT of 11/2010  . AVM (arteriovenous malformation) of duodenum, acquired     egds in 01/2012, 04/2011  . Polyp, colonic  Colonoscopy 01/2012 "benign" polyp  . CAD (coronary artery disease)     a. Cath 09/2012: moderate borderline CAD in mid LAD/small diagonal branch, mild RCA stenosis, to be managed medically   . BPH (benign prostatic hyperplasia)   . Prostate cancer   . Stroke   . Peripheral neuropathy     History   Social History  . Marital Status: Married    Spouse Name: N/A    Number of Children: 2  . Years of Education: N/A   Occupational History  . taken out of work due to back    Social History Main Topics  . Smoking status: Former Smoker -- 1.00 packs/day for 48 years    Types: Cigarettes    Quit date: 03/17/2012  . Smokeless tobacco: Never Used  . Alcohol Use: No     Comment: 09/20/2012 "Used to; stopped ~ 2009; never had problem w/it"  . Drug Use: No  . Sexual Activity: No   Other Topics Concern  .  Not on file   Social History Narrative   Regular exercise: rides horses   Caffeine use: occasionally    Past Surgical History  Procedure Laterality Date  . Shoulder open rotator cuff repair Left 1980's  . Inguinal hernia repair Right 2011  . Lumbar disc surgery  1980's  . Robot assisted laparoscopic radical prostatectomy N/A 01/19/2013    Procedure: ROBOTIC ASSISTED LAPAROSCOPIC RADICAL PROSTATECTOMY;  Surgeon: Bernestine Amass, MD;  Location: [facility];  Service: Urology;  Laterality: N/A;  . Lymphadenectomy Bilateral 01/19/2013    Procedure: LYMPHADENECTOMY;  Surgeon: Bernestine Amass, MD;  Location: [facility];  Service: Urology;  Laterality: Bilateral;  . Enteroscopy N/A 03/01/2013    Procedure: ENTEROSCOPY;  Surgeon: Inda Castle, MD;  Location: [facility];  Service: Endoscopy;  Laterality: N/A;    Family History  Problem Relation Age of Onset  . Heart attack Brother     Died at 34  . Stroke Father     Died at 20  . Diabetes Sister   . Diabetes Mother   . Stomach cancer Brother   . Heart attack Sister     No Known Allergies  Current Outpatient Prescriptions on File Prior to Visit  Medication Sig Dispense Refill  . carvedilol (COREG) 3.125 MG tablet Take 3.125 mg by mouth 2 (two) times daily with a meal.      . esomeprazole (NEXIUM) 40 MG capsule Take 1 capsule (40 mg total) by mouth daily at 12 noon.  30 capsule  11  . fluticasone (FLONASE) 50 MCG/ACT nasal spray Place 2 sprays into the nose daily as needed for allergies.       [name] Kitchen gabapentin (NEURONTIN) 300 MG capsule Take 300 mg by mouth at bedtime.      [name] Kitchen guaiFENesin (MUCINEX) 600 MG 12 hr tablet Take 600 mg by mouth as needed.      . hydrALAZINE (APRESOLINE) 10 MG tablet Take 1 tablet (10 mg total) by mouth every 8 (eight) hours.  90 tablet  1  . insulin glargine (LANTUS) 100 UNIT/ML injection Inject 10 Units into the skin at bedtime.       . insulin lispro (HUMALOG) 100 UNIT/ML injection Inject into the skin 3 (three) times  daily before meals. Sliding scale      . nitroGLYCERIN (NITROSTAT) 0.4 MG SL tablet Place 1 tablet (0.4 mg total) under the tongue every 5 (five) minutes as needed for chest pain (up to 3 doses).  25 tablet  2  . pantoprazole (PROTONIX) 40 MG tablet Take 1 tablet (40 mg total) by mouth daily.  30 tablet  3  . polyvinyl alcohol (LIQUIFILM TEARS) 1.4 % ophthalmic solution Place 1 drop into both eyes daily as needed (dry eyes).       No current facility-administered medications on file prior to visit.    BP 150/82  Pulse 107  Temp(Src) 97.5 F (36.4 C) (Oral)  Resp 16  Ht 5\' 7"  (1.702 m)  Wt 157 lb 1.9 oz (71.269 kg)  BMI 24.60 kg/m2  SpO2 98%       Objective:   Physical Exam  Constitutional: He is oriented to person, place, and time. He appears well-developed and well-nourished. No distress.  HENT:  Head: Normocephalic and atraumatic.  Cardiovascular: Normal rate and regular rhythm.   No murmur heard. Pulmonary/Chest: Effort normal and breath sounds normal. No respiratory distress. He has no wheezes. He has no rales. He exhibits no tenderness.  Lymphadenopathy:    He has no cervical adenopathy.  Neurological: He is alert and oriented to person, place, and time.  Left distal thumb, no sensation to light touch.  Psychiatric: He has a normal mood and affect. His behavior is normal. Judgment and thought content normal.          Assessment & Plan:

## 2013-03-23 NOTE — Telephone Encounter (Signed)
Please call pt and arrange 1-2 week post hospital follow up apt.

## 2013-03-23 NOTE — Telephone Encounter (Signed)
Please notify pt, blood count is improved.  CT negative for stroke, pneumonia resolved on chest x ray.

## 2013-03-24 DIAGNOSIS — R519 Headache, unspecified: Secondary | ICD-10-CM | POA: Insufficient documentation

## 2013-03-24 DIAGNOSIS — R51 Headache: Secondary | ICD-10-CM | POA: Insufficient documentation

## 2013-03-24 DIAGNOSIS — J189 Pneumonia, unspecified organism: Secondary | ICD-10-CM | POA: Insufficient documentation

## 2013-03-24 DIAGNOSIS — K219 Gastro-esophageal reflux disease without esophagitis: Secondary | ICD-10-CM | POA: Insufficient documentation

## 2013-03-24 NOTE — Assessment & Plan Note (Signed)
CT head was performed to evaluate for CVA given severe HA and thumb numbness. CT negative.

## 2013-03-24 NOTE — Assessment & Plan Note (Signed)
Lab Results  Component Value Date   WBC 7.2 03/23/2013   HGB 10.5* 03/23/2013   HCT 32.1* 03/23/2013   MCV 92.0 03/23/2013   PLT 382 03/23/2013  follow up H/H improved.  Monitor. Advised pt to return to the ED if recurrent rectal bleeding or black stools.

## 2013-03-24 NOTE — Assessment & Plan Note (Signed)
Continue current insulin dosing. Will request home health RN for insulin teaching and med administration teaching.

## 2013-03-24 NOTE — Assessment & Plan Note (Signed)
Resolved. Follow up chest x ray is clear.

## 2013-03-24 NOTE — Assessment & Plan Note (Signed)
Uncontrolled.  Continue PPI, defer med changes to GI.

## 2013-03-30 ENCOUNTER — Telehealth: Payer: Self-pay | Admitting: *Deleted

## 2013-03-30 NOTE — Telephone Encounter (Signed)
Received fax from [facility] requesting that recent office notes we faxed them be co-signed by Dr Charlett Blake.  Forms were signed and given to Darlina Guys with [facility] on 03/29/13.

## 2013-04-06 ENCOUNTER — Ambulatory Visit: Payer: Medicare Other | Admitting: Gastroenterology

## 2013-04-15 ENCOUNTER — Other Ambulatory Visit (HOSPITAL_BASED_OUTPATIENT_CLINIC_OR_DEPARTMENT_OTHER): Payer: Medicare Other | Admitting: Lab

## 2013-04-15 ENCOUNTER — Ambulatory Visit (HOSPITAL_BASED_OUTPATIENT_CLINIC_OR_DEPARTMENT_OTHER): Payer: Medicare Other

## 2013-04-15 ENCOUNTER — Encounter: Payer: Self-pay | Admitting: Hematology & Oncology

## 2013-04-15 ENCOUNTER — Ambulatory Visit (HOSPITAL_BASED_OUTPATIENT_CLINIC_OR_DEPARTMENT_OTHER): Payer: Medicare Other | Admitting: Hematology & Oncology

## 2013-04-15 VITALS — BP 107/62 | HR 110 | Temp 97.7°F | Resp 18 | Ht 67.0 in | Wt 162.0 lb

## 2013-04-15 DIAGNOSIS — N039 Chronic nephritic syndrome with unspecified morphologic changes: Secondary | ICD-10-CM

## 2013-04-15 DIAGNOSIS — IMO0002 Reserved for concepts with insufficient information to code with codable children: Secondary | ICD-10-CM

## 2013-04-15 DIAGNOSIS — E876 Hypokalemia: Secondary | ICD-10-CM

## 2013-04-15 DIAGNOSIS — IMO0001 Reserved for inherently not codable concepts without codable children: Secondary | ICD-10-CM

## 2013-04-15 DIAGNOSIS — D509 Iron deficiency anemia, unspecified: Secondary | ICD-10-CM

## 2013-04-15 DIAGNOSIS — N189 Chronic kidney disease, unspecified: Secondary | ICD-10-CM

## 2013-04-15 DIAGNOSIS — D631 Anemia in chronic kidney disease: Secondary | ICD-10-CM

## 2013-04-15 DIAGNOSIS — E1165 Type 2 diabetes mellitus with hyperglycemia: Secondary | ICD-10-CM

## 2013-04-15 DIAGNOSIS — K922 Gastrointestinal hemorrhage, unspecified: Secondary | ICD-10-CM

## 2013-04-15 DIAGNOSIS — D649 Anemia, unspecified: Secondary | ICD-10-CM

## 2013-04-15 LAB — FERRITIN CHCC: Ferritin: 166 ng/ml (ref 22–316)

## 2013-04-15 LAB — CBC WITH DIFFERENTIAL (CANCER CENTER ONLY)
BASO#: 0 10*3/uL (ref 0.0–0.2)
BASO%: 0.4 % (ref 0.0–2.0)
EOS ABS: 0.2 10*3/uL (ref 0.0–0.5)
EOS%: 3.5 % (ref 0.0–7.0)
HEMATOCRIT: 35.3 % — AB (ref 38.7–49.9)
HEMOGLOBIN: 12 g/dL — AB (ref 13.0–17.1)
LYMPH#: 1 10*3/uL (ref 0.9–3.3)
LYMPH%: 21.7 % (ref 14.0–48.0)
MCH: 30.6 pg (ref 28.0–33.4)
MCHC: 34 g/dL (ref 32.0–35.9)
MCV: 90 fL (ref 82–98)
MONO#: 0.5 10*3/uL (ref 0.1–0.9)
MONO%: 11.8 % (ref 0.0–13.0)
NEUT#: 2.9 10*3/uL (ref 1.5–6.5)
NEUT%: 62.6 % (ref 40.0–80.0)
Platelets: 249 10*3/uL (ref 145–400)
RBC: 3.92 10*6/uL — AB (ref 4.20–5.70)
RDW: 13.5 % (ref 11.1–15.7)
WBC: 4.6 10*3/uL (ref 4.0–10.0)

## 2013-04-15 LAB — CMP (CANCER CENTER ONLY)
ALK PHOS: 103 U/L — AB (ref 26–84)
ALT: 18 U/L (ref 10–47)
AST: 20 U/L (ref 11–38)
Albumin: 2.5 g/dL — ABNORMAL LOW (ref 3.3–5.5)
BILIRUBIN TOTAL: 0.6 mg/dL (ref 0.20–1.60)
BUN, Bld: 11 mg/dL (ref 7–22)
CO2: 33 meq/L (ref 18–33)
CREATININE: 1.3 mg/dL — AB (ref 0.6–1.2)
Calcium: 8.6 mg/dL (ref 8.0–10.3)
Chloride: 100 mEq/L (ref 98–108)
GLUCOSE: 282 mg/dL — AB (ref 73–118)
Potassium: 2.8 mEq/L — CL (ref 3.3–4.7)
Sodium: 133 mEq/L (ref 128–145)
Total Protein: 5.8 g/dL — ABNORMAL LOW (ref 6.4–8.1)

## 2013-04-15 LAB — IRON AND TIBC CHCC
%SAT: 34 % (ref 20–55)
IRON: 71 ug/dL (ref 42–163)
TIBC: 208 ug/dL (ref 202–409)
UIBC: 136 ug/dL (ref 117–376)

## 2013-04-15 LAB — CHCC SATELLITE - SMEAR

## 2013-04-15 MED ORDER — ALTEPLASE 2 MG IJ SOLR
2.0000 mg | Freq: Once | INTRAMUSCULAR | Status: DC | PRN
  Filled 2013-04-15: qty 2

## 2013-04-15 MED ORDER — POTASSIUM CHLORIDE CRYS ER 20 MEQ PO TBCR: EXTENDED_RELEASE_TABLET | ORAL | Status: DC

## 2013-04-15 MED ORDER — SODIUM CHLORIDE 0.9 % IV SOLN: Freq: Once | INTRAVENOUS | Status: DC

## 2013-04-15 MED ORDER — FERUMOXYTOL INJECTION 510 MG/17 ML
510.0000 mg | Freq: Once | INTRAVENOUS | Status: AC
  Administered 2013-04-15: 510 mg via INTRAVENOUS
  Filled 2013-04-15: qty 17

## 2013-04-15 MED ORDER — INSULIN REGULAR HUMAN 100 UNIT/ML IJ SOLN
12.0000 [IU] | Freq: Once | INTRAMUSCULAR | Status: AC
  Administered 2013-04-15: 12 [IU] via SUBCUTANEOUS

## 2013-04-15 MED ORDER — HEPARIN SOD (PORK) LOCK FLUSH 100 UNIT/ML IV SOLN
250.0000 [IU] | Freq: Once | INTRAVENOUS | Status: DC | PRN
  Filled 2013-04-15: qty 5

## 2013-04-15 MED ORDER — HEPARIN SOD (PORK) LOCK FLUSH 100 UNIT/ML IV SOLN
500.0000 [IU] | Freq: Once | INTRAVENOUS | Status: DC | PRN
  Filled 2013-04-15: qty 5

## 2013-04-15 MED ORDER — SODIUM CHLORIDE 0.9 % IV SOLN
Freq: Once | INTRAVENOUS | Status: AC
  Administered 2013-04-15: 10:00:00 via INTRAVENOUS

## 2013-04-15 MED ORDER — SODIUM CHLORIDE 0.9 % IJ SOLN
10.0000 mL | INTRAMUSCULAR | Status: DC | PRN
  Filled 2013-04-15: qty 10

## 2013-04-15 MED ORDER — SODIUM CHLORIDE 0.9 % IJ SOLN
3.0000 mL | Freq: Once | INTRAMUSCULAR | Status: DC | PRN
  Filled 2013-04-15: qty 10

## 2013-04-15 NOTE — Patient Instructions (Signed)
Femoral Endarterectomy Arteries carry blood from the heart to all parts of the body. The femoral artery delivers blood to the thigh and lower leg. This artery is found in the groin area. It can become clogged with a fatty substance (plaque). This condition is called peripheral arterial disease. This can cause your leg to ache. It might feel cold or numb. It can also cause pain in the calf of your leg when you walk (claudication). When the femoral artery gets clogged, it needs to be cleaned out. Surgery to clean it out (endarterectomy) is needed. LET YOUR CAREGIVER KNOW ABOUT:   Allergies to food or medicine.  Medicines taken, including vitamins, herbs, eyedrops, over-the-counter medicines, and creams.  Use of steroids (by mouth or creams).  Previous problems with anesthetics or numbing medicines.  History of bleeding problems or blood clots.  Previous surgery.  Other health problems, including diabetes and kidney problems.  Possibility of pregnancy, if this applies. RISKS AND COMPLICATIONS  Problems usually do not develop after a femoral endarterectomy. However, possible risks include:  Bleeding that continues.  Infection.  Nerve damage.  Blood clots. These can form in the legs. Clots that break loose could travel to the heart, lungs, or brain.  Plaque can build up again, causing another blockage. This can happen months or years after your surgery. BEFORE THE PROCEDURE   A medical evaluation will be done. This may include:  A physical examination. This includes questions about your overall health, today and in the past. Your blood pressure and heart rate will be checked.  Blood tests.  Doppler ultrasonography. This test uses sound waves to check blood flow through your femoral artery and leg.  Angiography. This is a type of X-ray. A dye is put in your blood so that it will show up on the X-ray.  Talking with an anesthesiologist. Usually, a medicine that will make you sleep  (general anesthesia) is used. However, if spinal anesthesia is used, you will be numb from the waist down. You will be groggy but awake during the procedure. Ask your caregiver what to expect.  You may need to take medicine to prevent blood clots. Aspirin or blood thinners (anticoagulants) are used for this.  You will need to sign a legal paper that gives permission for the surgery (informed consent).  The day before the surgery, eat only a light dinner. Do not eat or drink anything for at least 6 hours before the surgery. Ask your caregiver if it is okay to take any needed medicines with a sip of water.  Arrive at least 1 hour before the surgery, or as told by your caregiver. This will give you time to check in and fill out any needed paperwork. PROCEDURE   Small monitors will be placed on your body. They are used to check your heart, blood pressure, and oxygen level.  You will be given an intravenous (IV) line. Medicine will flow directly into your body through the IV.  If general anesthesia is used, a tube will be put in your throat. This helps you breathe during the surgery.  Your groin area will be cleaned with a germ-killing solution.  Once you are asleep, the surgeon will make a cut (incision) in the groin area above the femoral artery. The incision is usually a few inches long.  A clamp may be put on the artery above and below the blockage. This stops blood from flowing through the area.  The artery is cut open. The plaque is  stripped away from the inner walls of the artery.  During the surgery, angiography may be done. This helps the surgeon tell how far the blockage goes. Later on, it can show whether all of the plaque is gone.  The artery will be closed. Then, the clamp is released. Blood can flow through the artery again.  The surgeon will close the incision. Staples or stitches will be used.  A bandage with medicine (dressing) will be placed over the incision. AFTER THE  PROCEDURE   You will stay in a recovery area until the anesthesia wears off. Your blood pressure and pulse will be checked. Then, you will be taken to a hospital room.  You may continue to get fluids through the IV for awhile.  Some pain is normal. You will probably be given pain medicine while in the recovery area. If your pain gets worse, be sure to tell your caregiver.  Angiography may be done again before you go home. This is to make sure the blood is flowing through the artery like it should.  Most people stay in the hospital for 1 or 2 days after this procedure. HOME CARE INSTRUCTIONS   Take the medicines your caregiver has prescribed. Follow the directions carefully. Medicines may include blood thinners and pain medicine.  Do not take over-the-counter pain medicine unless your caregiver says it is okay.  Do not drive if you are taking narcotic pain medicines.  Keep the incision dry for a few days after surgery. Ask when it will be okay to take a shower. Do not take a tub bath for at least 2 weeks.  You will need to return to have your stitches (sutures) or staples taken out. This is usually done about a week after your surgery.  Walk. This is important. It will help keep blood clots from forming.  Go back to your normal routine slowly. Ask your caregiver if you have questions about a specific activity. Ask when it will be okay for you to drive.  Ask your caregiver when you can go back to work. This will depend on the type of work you do.  Do not smoke. SEEK MEDICAL CARE IF:   You have any questions about medicines.  Pain continues, even after taking pain medicine.  You have pain or cramps in your leg while walking.  You have an oral temperature above 102 F (38.9 C). SEEK IMMEDIATE MEDICAL CARE IF:   Pain gets much worse.  You have shortness of breath.  You have chest pain.  The area around the incision becomes red and swells.  Blood or other liquid leaks from  the incision.  Your leg becomes red, swollen, or sore.  Your leg or foot changes color or becomes numb.  You have an oral temperature above 102 F (38.9 C), not controlled by medicine. MAKE SURE YOU:   Understand these instructions.  Will watch your condition.  Will get help right away if you are not doing well or get worse. Document Released: 05/28/2009 Document Revised: 05/26/2011 Document Reviewed: 05/28/2009 [facility] Patient Information 2014 [place], [place].

## 2013-04-15 NOTE — Progress Notes (Signed)
This office note has been dictated.

## 2013-04-16 NOTE — Progress Notes (Signed)
DIAGNOSES: 1. Recurrent iron-deficiency anemia secondary to gastrointestinal     bleeding. 2. Anemia of renal insufficiency secondary to poorly controlled     diabetes. 3. History of stage I prostate cancer.  CURRENT THERAPY: 1. Aranesp 300 mcg subcu as needed for hemoglobin less than 10. 2. IV iron as indicated.  INTERIM HISTORY:  Mr. Livermore comes in for his followup.  This actually is the best that I have seen him look in about 3 or 4 months.  We have been giving him IV iron.  We gave him Aranesp when we saw him last time.  His diabetes continues to be a problem.  He is not really able to control his blood sugars all that well.  He has not noticed any black stool.  He goes to [facility] he says in a week or so.  I suspect __________ probably do some type of small bowel endoscopic procedure.  He feels more energetic.  He has a better appetite.  He does have some pelvic pain.  He did see his urologist a week or so ago.  Nothing was found to be unusual.  PHYSICAL EXAMINATION:  General:  This is a fairly well-developed, well- nourished African American gentleman in no obvious distress.  Vital Signs:  Temperature of 97.7, pulse 110, respiratory rate 18, blood pressure 107/62, weight is 162 pounds.  Head and Neck:  Normocephalic, atraumatic skull.  There are no ocular or oral lesions.  There are no palpable cervical or supraclavicular lymph nodes.  Lungs:  Clear to percussion and auscultation bilaterally.  Cardiac:  Regular rate and rhythm with normal S1 and S2.  There are no murmurs, rubs, or bruits. Abdomen:  Soft.  He has good bowel sounds.  He has well-healed laparoscopy scars.  There is no palpable abdominal mass.  There is no palpable hepatosplenomegaly.  Extremities:  Show no clubbing, cyanosis, or edema.  Neurological:  Shows no focal neurological deficits.  Skin: No rashes, ecchymosis, or petechiae.  LABORATORY STUDIES:  White cell count is 4.6, hemoglobin 12,  hematocrit 35.3, platelet count 249.  MCV is 90.  Blood sugar is 282.  IMPRESSION:  Mr. Lolli is a nice 68 year old African American gentleman. He has iron-deficiency anemia.  He has history of multiple GI bleed.  Again, I am just so thankful that he looks as well as he does.  We will go ahead and give him 510 mg of Feraheme today.  I think this will help.  We will give him some insulin.  His potassium is only 2.8.  I gave him a prescription for oral potassium.  These are 20 mEq doses.  I told him to take it 2 a day for 5 days and then 1 a day.  I will plan to get him back to see me in another 3 weeks or so.    ______________________________ Volanda Napoleon, M.D. PRE/MEDQ  D:  04/15/2013  T:  04/16/2013  Job:  [id number]

## 2013-04-19 LAB — TRANSFERRIN RECEPTOR, SOLUABLE: Transferrin Receptor, Soluble: 2.69 mg/L — ABNORMAL HIGH (ref 0.76–1.76)

## 2013-04-20 ENCOUNTER — Telehealth: Payer: Self-pay | Admitting: Gastroenterology

## 2013-04-20 NOTE — Telephone Encounter (Signed)
Faxed as requested

## 2013-04-27 DIAGNOSIS — I10 Essential (primary) hypertension: Secondary | ICD-10-CM | POA: Insufficient documentation

## 2013-04-27 DIAGNOSIS — E118 Type 2 diabetes mellitus with unspecified complications: Secondary | ICD-10-CM | POA: Insufficient documentation

## 2013-04-29 ENCOUNTER — Telehealth: Payer: Self-pay | Admitting: Gastroenterology

## 2013-04-29 NOTE — Telephone Encounter (Signed)
Rec'd 2 actually pages of office note from [facility] for Dr. Ardis Hughs on 04/27/13  Sent up to Dr. Ardis Hughs 04/29/13

## 2013-05-04 ENCOUNTER — Encounter: Payer: Self-pay | Admitting: Gastroenterology

## 2013-05-04 ENCOUNTER — Ambulatory Visit (INDEPENDENT_AMBULATORY_CARE_PROVIDER_SITE_OTHER): Payer: Medicare Other | Admitting: Gastroenterology

## 2013-05-04 VITALS — BP 112/68 | HR 64 | Ht 67.0 in | Wt 159.4 lb

## 2013-05-04 DIAGNOSIS — R1310 Dysphagia, unspecified: Secondary | ICD-10-CM

## 2013-05-04 DIAGNOSIS — K5520 Angiodysplasia of colon without hemorrhage: Secondary | ICD-10-CM

## 2013-05-04 NOTE — Patient Instructions (Addendum)
Follow up with Dr. Arsenio Loader at [facility] as already scheduled, for your interimttent GI bleeding. Barium esophagram for your swallowing difficulty.  You have been scheduled for a Barium Esophogram at [facility] (1st floor of the hospital) on 05/06/13 at 1030 am. Please arrive 15 minutes prior to your appointment for registration. Make certain not to have anything to eat or drink 6 hours prior to your test. If you need to reschedule for any reason, please contact radiology at [phone] to do so. __________________________________________________________________ A barium swallow is an examination that concentrates on views of the esophagus. This tends to be a double contrast exam (barium and two liquids which, when combined, create a gas to distend the wall of the oesophagus) or single contrast (non-ionic iodine based). The study is usually tailored to your symptoms so a good history is essential. Attention is paid during the study to the form, structure and configuration of the esophagus, looking for functional disorders (such as aspiration, dysphagia, achalasia, motility and reflux) EXAMINATION You may be asked to change into a gown, depending on the type of swallow being performed. A radiologist and radiographer will perform the procedure. The radiologist will advise you of the type of contrast selected for your procedure and direct you during the exam. You will be asked to stand, sit or lie in several different positions and to hold a small amount of fluid in your mouth before being asked to swallow while the imaging is performed .In some instances you may be asked to swallow barium coated marshmallows to assess the motility of a solid food bolus. The exam can be recorded as a digital or video fluoroscopy procedure. POST PROCEDURE It will take 1-2 days for the barium to pass through your system. To facilitate this, it is important, unless otherwise directed, to increase your fluids for the  next 24-48hrs and to resume your normal diet.  This test typically takes about 30 minutes to perform. __________________________________________________________________________________

## 2013-05-04 NOTE — Progress Notes (Signed)
Review of pertinent gastrointestinal problems:  1. 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 Valli Glance. Upper endoscopy February 2013 showed hiatal hernia. Colonoscopy February 2013 found a 7 mm adenomatous polyp. These did not explain his iron deficiency anemia and so he underwent repeat colonoscopy November 2013 it showed diverticulosis, his terminal ileum was normal. Capsule endoscopy February 2013 was read as showing "jejunal ulcerations felt do to NSAIDs which he was taking." EGD with small bowel enteroscopy February 2014 describes small bowel AVMs in the distal duodenum, proximal jejunum. Dr. Elita Quick felt that small bowel AVMs distal to the extent of the exam are highly probable and he considers referral to fact this hospital for double balloon enteroscopy if he experienced recurrent iron deficiency anemia. He sees Dr. Burney Gauze, is on oral iron gets periodic iron transfusions. 02/2013 enteroscopy Dr. Deatra Ina, while hospitalized at [facility] for bleeding, anemia; found "at least 5 small AVMS in duodenum, jejunum" all treated with APC.  Evaluation by Dr. Arsenio Loader at [facility] 04/2013 "I have no doubt that he has a small intestinal AVMS and that these have been a bleeding source in the past. I am concerned about his other medical problems including his hypertension management in his diabetes managementand that these have been a bleeding source in the past. I am concerned about his other medical problems including his hypertension management in his diabetes management. There is no urgency for DBE since his is not iron deficient. Follow up in 3 months" 2. 30m adenomatous colon polyp removed February 2013, recall colonoscopy should be February 2018.  HPI: This is a  very pleasant 68year old whom I last saw many months ago.  [place]. He underwent enteroscopy by one of my partners who apply APC to  several small bowel AVMs.    Labs 3 weeks ago show Hb 12, normal iron level.  Was in office of Dr. GArsenio Loaderlast week.  See a summary of that interaction above.  No dark stools recently.  He does not take iron supplement orally.  Only gets it IV.  Last was 2 weeks ago.  No abd pains.    Past Medical History  Diagnosis Date  . Hypertension   . High cholesterol   . GERD (gastroesophageal reflux disease)   . GI bleed     a. Recurrent GI bleed, tx with IV iron. b. Per heme notes - likely AVMs 04/2012 (tx with cauterization several months ago).  . Orthostatic hypotension     a. Tx with florinef.  . Hematuria     a. Urology note scan from 07/2012: cystoscopy without evidence for bladder lesion, only lateral hypertrophy of posterior urethra, bladder impression from BPH. b. Pt states he had "some tests" scheduled for later in July 2014.  . Stage III chronic kidney disease     a. Stage 3 (DM with complications ->CKD, peripheral neuropathy).  . Anemia, iron deficiency 03/24/2011    a. Recurrent GI bleed, tx with periodic iron infusions.  . 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 Sleep apnea     "had mask; couldn't sleep in it" (09/20/2012)  . Type II diabetes mellitus     a. Dx 1994, uncontrolled.   . Diabetic peripheral neuropathy 2014    foot pain.  . Chronic lower back pain   . Fatty liver     on CT of 11/2010  . AVM (arteriovenous malformation)  of duodenum, acquired     egds in 01/2012, 04/2011  . Polyp, colonic     Colonoscopy 01/2012 "benign" polyp  . CAD (coronary artery disease)     a. Cath 09/2012: moderate borderline CAD in mid LAD/small diagonal branch, mild RCA stenosis, to be managed medically   . BPH (benign prostatic hyperplasia)   . Prostate cancer   . Stroke   . Peripheral neuropathy     Past Surgical History  Procedure Laterality Date  . Shoulder open rotator cuff repair Left 1980's  . Inguinal hernia repair Right 2011  . Lumbar disc surgery  1980's  . Robot  assisted laparoscopic radical prostatectomy N/A 01/19/2013    Procedure: ROBOTIC ASSISTED LAPAROSCOPIC RADICAL PROSTATECTOMY;  Surgeon: Bernestine Amass, MD;  Location: [facility];  Service: Urology;  Laterality: N/A;  . Lymphadenectomy Bilateral 01/19/2013    Procedure: LYMPHADENECTOMY;  Surgeon: Bernestine Amass, MD;  Location: [facility];  Service: Urology;  Laterality: Bilateral;  . Enteroscopy N/A 03/01/2013    Procedure: ENTEROSCOPY;  Surgeon: Inda Castle, MD;  Location: [place];  Service: Endoscopy;  Laterality: N/A;    Current Outpatient Prescriptions  Medication Sig Dispense Refill  . carvedilol (COREG) 3.125 MG tablet Take 3.125 mg by mouth 2 (two) times daily with a meal.      . esomeprazole (NEXIUM) 40 MG capsule Take 1 capsule (40 mg total) by mouth daily at 12 noon.  30 capsule  11  . gabapentin (NEURONTIN) 300 MG capsule Take 300 mg by mouth at bedtime.      . insulin glargine (LANTUS) 100 UNIT/ML injection Inject 20 Units into the skin at bedtime.       . insulin lispro (HUMALOG) 100 UNIT/ML injection Inject 5 Units into the skin 3 (three) times daily before meals. Sliding scale      . nitroGLYCERIN (NITROSTAT) 0.4 MG SL tablet Place 1 tablet (0.4 mg total) under the tongue every 5 (five) minutes as needed for chest pain (up to 3 doses).  25 tablet  2  . pantoprazole (PROTONIX) 40 MG tablet Take 1 tablet (40 mg total) by mouth daily.  30 tablet  3  . polyvinyl alcohol (LIQUIFILM TEARS) 1.4 % ophthalmic solution Place 1 drop into both eyes daily as needed (dry eyes).      . potassium chloride SA (K-DUR,KLOR-CON) 20 MEQ tablet Take 2 a day for 5 days, then take 1 a day  30 tablet  0  . tamsulosin (FLOMAX) 0.4 MG CAPS capsule Take 0.4 mg by mouth daily.       No current facility-administered medications for this visit.    Allergies as of 05/04/2013  . (No Known Allergies)    Family History  Problem Relation Age of Onset  . Heart attack Brother     Died at 80  . Stroke Father      Died at 59  . Diabetes Sister   . Diabetes Mother   . Stomach cancer Brother   . Heart attack Sister     History   Social History  . Marital Status: Married    Spouse Name: N/A    Number of Children: 2  . Years of Education: N/A   Occupational History  . taken out of work due to back    Social History Main Topics  . Smoking status: Former Smoker -- 1.00 packs/day for 43 years    Types: Cigarettes    Start date: 04/15/1968    Quit date: 04/18/2011  .  Smokeless tobacco: Never Used     Comment: quit 2 years ago  . Alcohol Use: No     Comment: 09/20/2012 "Used to; stopped ~ 2009; never had problem w/it"  . Drug Use: No  . Sexual Activity: No   Other Topics Concern  . Not on file   Social History Narrative   Regular exercise: rides horses   Caffeine use: occasionally      Physical Exam: BP 112/68  Pulse 64  Ht _0  (1.702 m)  Wt 159 lb 6.4 oz (72.303 kg)  BMI 24.96 kg/m2 Constitutional: generally well-appearing Psychiatric: alert and oriented x3 Abdomen: soft, nontender, nondistended, no obvious ascites, no peritoneal signs, normal bowel sounds     Assessment and plan: 68 y.o. male with chronic iron deficiency anemia from small bowel AVMs  Last week he was at [facility] outpatient facility and he met with Dr. Arsenio Loader to discuss small bowel double balloon enteroscopy. That was deferred, followup scheduled for 3 months from now. He has had no overt recurrent bleeding. He does not take oral iron and so it is generally pretty tell if he is having bleeding again with melenic stools. His last blood count was 2 weeks ago and was almost normal. His iron studies were normal. He will continue getting IV iron. Today he also mentioned some dysphagia which she has been having. I see no report of any type of esophageal anomalies on any of his previous upper endoscopies, done [facility], done here in [place]. I will therefore a barium esophagram as first test here.

## 2013-05-05 NOTE — Telephone Encounter (Signed)
Pt did not call for an apt. Closing encounter

## 2013-05-06 ENCOUNTER — Ambulatory Visit (HOSPITAL_COMMUNITY)
Admission: RE | Admit: 2013-05-06 | Discharge: 2013-05-06 | Disposition: A | Discharge status: Home / Self Care | Payer: Medicare Other | Source: Ambulatory Visit | Attending: Gastroenterology | Admitting: Gastroenterology

## 2013-05-06 ENCOUNTER — Telehealth: Payer: Self-pay | Admitting: Gastroenterology

## 2013-05-06 DIAGNOSIS — K219 Gastro-esophageal reflux disease without esophagitis: Secondary | ICD-10-CM | POA: Insufficient documentation

## 2013-05-06 DIAGNOSIS — R1310 Dysphagia, unspecified: Secondary | ICD-10-CM | POA: Insufficient documentation

## 2013-05-06 NOTE — Telephone Encounter (Signed)
Rec'd from [place] forward 12 pages to [place]

## 2013-05-08 ENCOUNTER — Other Ambulatory Visit: Payer: Self-pay | Admitting: Hematology & Oncology

## 2013-05-10 ENCOUNTER — Telehealth: Payer: Self-pay | Admitting: *Deleted

## 2013-05-10 NOTE — Telephone Encounter (Signed)
Received fax from [facility] that pt is requesting order for lumbar-sacral orthosis. Also received fax from [facility] requesting order for therapeutic diabetic shoes and custom inserts. Left message for pt to return my call and verify these requests.

## 2013-05-11 ENCOUNTER — Other Ambulatory Visit: Payer: Medicare Other | Admitting: Lab

## 2013-05-11 ENCOUNTER — Ambulatory Visit: Payer: Medicare Other

## 2013-05-11 ENCOUNTER — Encounter: Payer: Medicare Other | Admitting: Hematology & Oncology

## 2013-05-11 NOTE — Progress Notes (Deleted)
05-11-13  Called patient

## 2013-05-13 NOTE — Telephone Encounter (Signed)
He states that he did not request anything from [facility] but did request the diabetic shoes and inserts from [facility]. Patient states that he has had diabetic shoes before but that [facility] did not prescribe them. I explained to him that we will discuss this issue at his next visit.

## 2013-05-14 ENCOUNTER — Other Ambulatory Visit: Payer: Self-pay | Admitting: Family

## 2013-05-14 ENCOUNTER — Other Ambulatory Visit: Payer: Self-pay | Admitting: Internal Medicine

## 2013-05-16 ENCOUNTER — Other Ambulatory Visit: Payer: Self-pay | Admitting: Family

## 2013-05-16 NOTE — Telephone Encounter (Signed)
Atorvastatin is not on pt's current med list. Not sure if it expired or if pt is still supposed to be taking it?  Please advise.  Medication name:  Name from pharmacy:  atorvastatin (LIPITOR) 80 MG tablet  ATORVASTATIN 80 MG TABLET The source prescription has been discontinued. Sig: TAKE 1 TABLET EVERY DAY Dispense: 30 tablet Refills: 2 Start: 05/14/2013 Class: Normal Requested on: 02/01/2013 Originally ordered on: 10/21/2011 Last refill: 04/09/2013

## 2013-05-17 ENCOUNTER — Ambulatory Visit (HOSPITAL_BASED_OUTPATIENT_CLINIC_OR_DEPARTMENT_OTHER): Payer: Medicare Other

## 2013-05-17 VITALS — BP 145/90 | HR 110 | Temp 96.8°F | Resp 18

## 2013-05-17 DIAGNOSIS — N189 Chronic kidney disease, unspecified: Secondary | ICD-10-CM

## 2013-05-17 DIAGNOSIS — D631 Anemia in chronic kidney disease: Secondary | ICD-10-CM

## 2013-05-17 DIAGNOSIS — IMO0001 Reserved for inherently not codable concepts without codable children: Secondary | ICD-10-CM

## 2013-05-17 DIAGNOSIS — K922 Gastrointestinal hemorrhage, unspecified: Secondary | ICD-10-CM

## 2013-05-17 DIAGNOSIS — D509 Iron deficiency anemia, unspecified: Secondary | ICD-10-CM

## 2013-05-17 DIAGNOSIS — D649 Anemia, unspecified: Secondary | ICD-10-CM

## 2013-05-17 DIAGNOSIS — E1165 Type 2 diabetes mellitus with hyperglycemia: Secondary | ICD-10-CM

## 2013-05-17 MED ORDER — FERUMOXYTOL INJECTION 510 MG/17 ML
510.0000 mg | Freq: Once | INTRAVENOUS | Status: AC
Start: 2013-05-17 — End: 2013-05-17
  Administered 2013-05-17: 510 mg via INTRAVENOUS
  Filled 2013-05-17: qty 17

## 2013-05-17 MED ORDER — SODIUM CHLORIDE 0.9 % IV SOLN
Freq: Once | INTRAVENOUS | Status: AC
Start: 2013-05-17 — End: 2013-05-17
  Administered 2013-05-17: 09:00:00 via INTRAVENOUS

## 2013-05-17 NOTE — Patient Instructions (Signed)
Ferumoxytol injection What is this medicine? FERUMOXYTOL is an iron complex. Iron is used to make healthy red blood cells, which carry oxygen and nutrients throughout the body. This medicine is used to treat iron deficiency anemia in people with chronic kidney disease. This medicine may be used for other purposes; ask your health care provider or pharmacist if you have questions. What should I tell my health care provider before I take this medicine? They need to know if you have any of these conditions: -anemia not caused by low iron levels -high levels of iron in the blood -magnetic resonance imaging (MRI) test scheduled -an unusual or allergic reaction to iron, other medicines, foods, dyes, or preservatives -pregnant or trying to get pregnant -breast-feeding How should I use this medicine? This medicine is for infusion into a vein. It is given by a health care professional in a hospital or clinic setting. Talk to your pediatrician regarding the use of this medicine in children. Special care may be needed. Overdosage: If you think you've taken too much of this medicine contact a poison control center or emergency room at once. Overdosage: If you think you have taken too much of this medicine contact a poison control center or emergency room at once. NOTE: This medicine is only for you. Do not share this medicine with others. What if I miss a dose? It is important not to miss your dose. Call your doctor or health care professional if you are unable to keep an appointment. What may interact with this medicine? This medicine may interact with the following medications: -other iron products This list may not describe all possible interactions. Give your health care provider a list of all the medicines, herbs, non-prescription drugs, or dietary supplements you use. Also tell them if you smoke, drink alcohol, or use illegal drugs. Some items may interact with your medicine. What should I watch  for while using this medicine? Visit your doctor or healthcare professional regularly. Tell your doctor or healthcare professional if your symptoms do not start to get better or if they get worse. You may need blood work done while you are taking this medicine. You may need to follow a special diet. Talk to your doctor. Foods that contain iron include: whole grains/cereals, dried fruits, beans, or peas, leafy green vegetables, and organ meats (liver, kidney). What side effects may I notice from receiving this medicine? Side effects that you should report to your doctor or health care professional as soon as possible: -allergic reactions like skin rash, itching or hives, swelling of the face, lips, or tongue -breathing problems -changes in blood pressure -feeling faint or lightheaded, falls -fever or chills -flushing, sweating, or hot feelings -swelling of the ankles or feet Side effects that usually do not require medical attention (Report these to your doctor or health care professional if they continue or are bothersome.): -diarrhea -headache -nausea, vomiting -stomach pain This list may not describe all possible side effects. Call your doctor for medical advice about side effects. You may report side effects to FDA at 1-800-FDA-1088. Where should I keep my medicine? This drug is given in a hospital or clinic and will not be stored at home. NOTE: This sheet is a summary. It may not cover all possible information. If you have questions about this medicine, talk to your doctor, pharmacist, or health care provider.  2012, Elsevier/Gold Standard. (11/24/2007 9:48:25 PM) 

## 2013-05-18 NOTE — Progress Notes (Signed)
This encounter was created in error - please disregard.

## 2013-05-23 ENCOUNTER — Ambulatory Visit: Payer: Medicare Other | Admitting: Family

## 2013-05-24 ENCOUNTER — Encounter: Payer: Self-pay | Admitting: Family

## 2013-05-24 ENCOUNTER — Ambulatory Visit (INDEPENDENT_AMBULATORY_CARE_PROVIDER_SITE_OTHER): Payer: Medicare Other | Admitting: Family

## 2013-05-24 VITALS — BP 120/72 | HR 90 | Temp 98.3°F | Resp 12 | Ht 67.0 in | Wt 160.0 lb

## 2013-05-24 DIAGNOSIS — E785 Hyperlipidemia, unspecified: Secondary | ICD-10-CM

## 2013-05-24 DIAGNOSIS — E119 Type 2 diabetes mellitus without complications: Secondary | ICD-10-CM

## 2013-05-24 DIAGNOSIS — IMO0002 Reserved for concepts with insufficient information to code with codable children: Secondary | ICD-10-CM

## 2013-05-24 DIAGNOSIS — E1165 Type 2 diabetes mellitus with hyperglycemia: Secondary | ICD-10-CM

## 2013-05-24 DIAGNOSIS — IMO0001 Reserved for inherently not codable concepts without codable children: Secondary | ICD-10-CM

## 2013-05-24 LAB — HEPATIC FUNCTION PANEL
ALK PHOS: 102 U/L (ref 39–117)
ALT: 9 U/L (ref 0–53)
AST: 11 U/L (ref 0–37)
Albumin: 2.7 g/dL — ABNORMAL LOW (ref 3.5–5.2)
BILIRUBIN DIRECT: 0.1 mg/dL (ref 0.0–0.3)
Indirect Bilirubin: 0.4 mg/dL (ref 0.2–1.2)
Total Bilirubin: 0.5 mg/dL (ref 0.2–1.2)
Total Protein: 5.4 g/dL — ABNORMAL LOW (ref 6.0–8.3)

## 2013-05-24 LAB — HEMOGLOBIN A1C
Hgb A1c MFr Bld: 10.9 % — ABNORMAL HIGH (ref <5.7)
Mean Plasma Glucose: 266 mg/dL — ABNORMAL HIGH (ref <117)

## 2013-05-24 MED ORDER — PANTOPRAZOLE SODIUM 40 MG PO TBEC: 40.0000 mg | DELAYED_RELEASE_TABLET | Freq: Every day | ORAL | Status: DC

## 2013-05-24 NOTE — Progress Notes (Addendum)
Subjective:    Patient ID: [name], male    DOB: [birth year], 68 y.o.   MRN: [id number]  HPI  [name] is  68 yr old male who presents today for follow up.  1)DM2- requesting form for diabetic shoes. Reports "bad neuropathy," in his feet.  Currently maintained on humalog sliding scale and lantus.    2) Hyperlipidemia-  Maintained on lipitor 80mg .   Review of Systems See HPI  Past Medical History  Diagnosis Date  . Hypertension   . High cholesterol   . GERD (gastroesophageal reflux disease)   . GI bleed     a. Recurrent GI bleed, tx with IV iron. b. Per heme notes - likely AVMs 04/2012 (tx with cauterization several months ago).  . Orthostatic hypotension     a. Tx with florinef.  . Hematuria     a. Urology note scan from 07/2012: cystoscopy without evidence for bladder lesion, only lateral hypertrophy of posterior urethra, bladder impression from BPH. b. Pt states he had "some tests" scheduled for later in July 2014.  . Stage III chronic kidney disease     a. Stage 3 (DM with complications ->CKD, peripheral neuropathy).  . Anemia, iron deficiency 03/24/2011    a. Recurrent GI bleed, tx with periodic iron infusions.  . 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 Sleep apnea     "had mask; couldn't sleep in it" (09/20/2012)  . Type II diabetes mellitus     a. Dx 1994, uncontrolled.   . Diabetic peripheral neuropathy 2014    foot pain.  . Chronic lower back pain   . Fatty liver     on CT of 11/2010  . AVM (arteriovenous malformation) of duodenum, acquired     egds in 01/2012, 04/2011  . Polyp, colonic     Colonoscopy 01/2012 "benign" polyp  . CAD (coronary artery disease)     a. Cath 09/2012: moderate borderline CAD in mid LAD/small diagonal branch, mild RCA stenosis, to be managed medically   . BPH (benign prostatic hyperplasia)   . Prostate cancer   . Stroke   . Peripheral neuropathy     History   Social History  . Marital Status: Married    Spouse Name: N/A    Number of  Children: 2  . Years of Education: N/A   Occupational History  . taken out of work due to back    Social History Main Topics  . Smoking status: Former Smoker -- 1.00 packs/day for 43 years    Types: Cigarettes    Start date: 04/15/1968    Quit date: 04/18/2011  . Smokeless tobacco: Never Used     Comment: quit 2 years ago  . Alcohol Use: No     Comment: 09/20/2012 "Used to; stopped ~ 2009; never had problem w/it"  . Drug Use: No  . Sexual Activity: No   Other Topics Concern  . Not on file   Social History Narrative   Regular exercise: rides horses   Caffeine use: occasionally    Past Surgical History  Procedure Laterality Date  . Shoulder open rotator cuff repair Left 1980's  . Inguinal hernia repair Right 2011  . Lumbar disc surgery  1980's  . Robot assisted laparoscopic radical prostatectomy N/A 01/19/2013    Procedure: ROBOTIC ASSISTED LAPAROSCOPIC RADICAL PROSTATECTOMY;  Surgeon: Bernestine Amass, MD;  Location: [facility];  Service: Urology;  Laterality: N/A;  . Lymphadenectomy Bilateral 01/19/2013    Procedure: LYMPHADENECTOMY;  Surgeon:  Bernestine Amass, MD;  Location: [facility];  Service: Urology;  Laterality: Bilateral;  . Enteroscopy N/A 03/01/2013    Procedure: ENTEROSCOPY;  Surgeon: Inda Castle, MD;  Location: [facility];  Service: Endoscopy;  Laterality: N/A;    Family History  Problem Relation Age of Onset  . Heart attack Brother     Died at 15  . Stroke Father     Died at 78  . Diabetes Sister   . Diabetes Mother   . Stomach cancer Brother   . Heart attack Sister     No Known Allergies  Current Outpatient Prescriptions on File Prior to Visit  Medication Sig Dispense Refill  . atorvastatin (LIPITOR) 80 MG tablet TAKE 1 TABLET EVERY DAY  30 tablet  2  . carvedilol (COREG) 3.125 MG tablet Take 3.125 mg by mouth 2 (two) times daily with a meal.      . gabapentin (NEURONTIN) 300 MG capsule Take 300 mg by mouth at bedtime.      . hydrALAZINE (APRESOLINE) 10  MG tablet TAKE 1 TABLET BY MOUTH EVERY 8 HOURS  90 tablet  2  . insulin glargine (LANTUS) 100 UNIT/ML injection Inject 20 Units into the skin at bedtime.       . insulin lispro (HUMALOG) 100 UNIT/ML injection Inject 5 Units into the skin 3 (three) times daily before meals. Sliding scale      . KLOR-CON M20 20 MEQ tablet TAKE 2 TABLETS BY MOUTH EVERY DAY FOR 5 DAYS THEN 1 TABLET A DAY  30 tablet  0  . nitroGLYCERIN (NITROSTAT) 0.4 MG SL tablet Place 1 tablet (0.4 mg total) under the tongue every 5 (five) minutes as needed for chest pain (up to 3 doses).  25 tablet  2  . polyvinyl alcohol (LIQUIFILM TEARS) 1.4 % ophthalmic solution Place 1 drop into both eyes daily as needed (dry eyes).      . tamsulosin (FLOMAX) 0.4 MG CAPS capsule Take 0.4 mg by mouth daily.       No current facility-administered medications on file prior to visit.    BP 120/72  Pulse 90  Temp(Src) 98.3 F (36.8 C) (Oral)  Resp 12  Ht 5\' 7"  (1.702 m)  Wt 160 lb (72.576 kg)  BMI 25.05 kg/m2  SpO2 97%       Objective:   Physical Exam  Constitutional: He is oriented to person, place, and time. He appears well-developed and well-nourished. No distress.  HENT:  Head: Normocephalic and atraumatic.  Cardiovascular: Normal rate and regular rhythm.   No murmur heard. Pulmonary/Chest: Effort normal and breath sounds normal. No respiratory distress. He has no wheezes. He has no rales. He exhibits no tenderness.  Musculoskeletal: He exhibits no edema.  Neurological: He is alert and oriented to person, place, and time.  Psychiatric: He has a normal mood and affect. His behavior is normal. Thought content normal.          Assessment & Plan:  Pt needs custom therapeutic footwear because of his diagnosis of diabetes, peripheral neuropathy and callous formation.

## 2013-05-24 NOTE — Patient Instructions (Addendum)
Please complete lab work prior to leaving.  Schedule follow up with Dr. Annye Asa. Follow up with [place] in 3 months.

## 2013-05-24 NOTE — Progress Notes (Signed)
Pre visit review using our clinic review tool, if applicable. No additional management support is needed unless otherwise documented below in the visit note. 

## 2013-05-24 NOTE — Telephone Encounter (Signed)
Received another request from [facility] for order for back brace.  Faxed response that we do not accept requests for medical supplies from the vendor. Pt must bring form and request service themselves if pt is interested in pursuing request.

## 2013-05-27 ENCOUNTER — Telehealth: Payer: Self-pay

## 2013-05-27 NOTE — Assessment & Plan Note (Addendum)
Lab Results  Component Value Date   HGBA1C 10.9* 05/24/2013   Diabetes remains uncontrolled suspect due to non-compliance. Pt instructed arrange follow up with Endocrinology.

## 2013-05-27 NOTE — Telephone Encounter (Signed)
Relevant patient education mailed to patient.  

## 2013-05-30 ENCOUNTER — Other Ambulatory Visit (HOSPITAL_BASED_OUTPATIENT_CLINIC_OR_DEPARTMENT_OTHER): Payer: Medicare Other | Admitting: Lab

## 2013-05-30 ENCOUNTER — Encounter: Payer: Self-pay | Admitting: Hematology & Oncology

## 2013-05-30 ENCOUNTER — Ambulatory Visit (HOSPITAL_BASED_OUTPATIENT_CLINIC_OR_DEPARTMENT_OTHER): Payer: Medicare Other | Admitting: Hematology & Oncology

## 2013-05-30 ENCOUNTER — Other Ambulatory Visit: Payer: Self-pay | Admitting: Internal Medicine

## 2013-05-30 VITALS — BP 116/61 | HR 104 | Temp 97.5°F | Resp 18 | Wt 161.0 lb

## 2013-05-30 DIAGNOSIS — E876 Hypokalemia: Secondary | ICD-10-CM

## 2013-05-30 DIAGNOSIS — D631 Anemia in chronic kidney disease: Secondary | ICD-10-CM

## 2013-05-30 DIAGNOSIS — IMO0002 Reserved for concepts with insufficient information to code with codable children: Secondary | ICD-10-CM

## 2013-05-30 DIAGNOSIS — D509 Iron deficiency anemia, unspecified: Secondary | ICD-10-CM

## 2013-05-30 DIAGNOSIS — IMO0001 Reserved for inherently not codable concepts without codable children: Secondary | ICD-10-CM

## 2013-05-30 DIAGNOSIS — D649 Anemia, unspecified: Secondary | ICD-10-CM

## 2013-05-30 DIAGNOSIS — E1165 Type 2 diabetes mellitus with hyperglycemia: Secondary | ICD-10-CM

## 2013-05-30 DIAGNOSIS — D508 Other iron deficiency anemias: Secondary | ICD-10-CM

## 2013-05-30 DIAGNOSIS — N189 Chronic kidney disease, unspecified: Secondary | ICD-10-CM

## 2013-05-30 DIAGNOSIS — Z8719 Personal history of other diseases of the digestive system: Secondary | ICD-10-CM

## 2013-05-30 LAB — CMP (CANCER CENTER ONLY)
ALT(SGPT): 12 U/L (ref 10–47)
AST: 14 U/L (ref 11–38)
Albumin: 2.4 g/dL — ABNORMAL LOW (ref 3.3–5.5)
Alkaline Phosphatase: 113 U/L — ABNORMAL HIGH (ref 26–84)
BUN, Bld: 10 mg/dL (ref 7–22)
CO2: 32 mEq/L (ref 18–33)
Calcium: 8.8 mg/dL (ref 8.0–10.3)
Chloride: 102 mEq/L (ref 98–108)
Creat: 1.2 mg/dl (ref 0.6–1.2)
GLUCOSE: 245 mg/dL — AB (ref 73–118)
Potassium: 2.8 mEq/L — CL (ref 3.3–4.7)
Sodium: 136 mEq/L (ref 128–145)
TOTAL PROTEIN: 6.6 g/dL (ref 6.4–8.1)
Total Bilirubin: 0.5 mg/dl (ref 0.20–1.60)

## 2013-05-30 LAB — IRON AND TIBC CHCC
%SAT: 35 % (ref 20–55)
Iron: 66 ug/dL (ref 42–163)
TIBC: 189 ug/dL — AB (ref 202–409)
UIBC: 123 ug/dL (ref 117–376)

## 2013-05-30 LAB — CBC WITH DIFFERENTIAL (CANCER CENTER ONLY)
BASO#: 0 10*3/uL (ref 0.0–0.2)
BASO%: 0.2 % (ref 0.0–2.0)
EOS ABS: 0.1 10*3/uL (ref 0.0–0.5)
EOS%: 2.6 % (ref 0.0–7.0)
HEMATOCRIT: 35.4 % — AB (ref 38.7–49.9)
HEMOGLOBIN: 12.5 g/dL — AB (ref 13.0–17.1)
LYMPH#: 0.9 10*3/uL (ref 0.9–3.3)
LYMPH%: 16.8 % (ref 14.0–48.0)
MCH: 30.9 pg (ref 28.0–33.4)
MCHC: 35.3 g/dL (ref 32.0–35.9)
MCV: 87 fL (ref 82–98)
MONO#: 0.7 10*3/uL (ref 0.1–0.9)
MONO%: 13.1 % — AB (ref 0.0–13.0)
NEUT#: 3.7 10*3/uL (ref 1.5–6.5)
NEUT%: 67.3 % (ref 40.0–80.0)
Platelets: 242 10*3/uL (ref 145–400)
RBC: 4.05 10*6/uL — ABNORMAL LOW (ref 4.20–5.70)
RDW: 13.6 % (ref 11.1–15.7)
WBC: 5.5 10*3/uL (ref 4.0–10.0)

## 2013-05-30 LAB — RETICULOCYTES (CHCC)
ABS Retic: 91.7 10*3/uL (ref 19.0–186.0)
RBC.: 4.17 MIL/uL — AB (ref 4.22–5.81)
RETIC CT PCT: 2.2 % (ref 0.4–2.3)

## 2013-05-30 LAB — FERRITIN CHCC: FERRITIN: 595 ng/mL — AB (ref 22–316)

## 2013-05-30 MED ORDER — POTASSIUM CHLORIDE CRYS ER 20 MEQ PO TBCR: 40.0000 meq | EXTENDED_RELEASE_TABLET | Freq: Once | ORAL | Status: DC

## 2013-05-31 ENCOUNTER — Telehealth: Payer: Self-pay | Admitting: *Deleted

## 2013-05-31 NOTE — Telephone Encounter (Signed)
Received fax from [facility] requesting supporting office notes / foot exam for diabetic shoes. Most recent office note and foot exam faxed to [phone].

## 2013-06-06 ENCOUNTER — Other Ambulatory Visit: Payer: Self-pay | Admitting: Family

## 2013-06-07 MED ORDER — GABAPENTIN 300 MG PO CAPS: 300.0000 mg | ORAL_CAPSULE | Freq: Every day | ORAL | Status: DC

## 2013-06-07 NOTE — Telephone Encounter (Signed)
Received request from pharmacy for gabapentin 300mg  three times a day, #30.  Current med list states pt takes 1 at bedtime.  Current request denied with note that new rx will follow.  Refill sent with current directions of 300mg  at bedtime.

## 2013-06-08 NOTE — Progress Notes (Signed)
DIAGNOSES: 1. Recurrent iron-deficiency anemia secondary to gastrointestinal     bleeding. 2. Anemia of renal insufficiency secondary to poorly controlled     diabetes. 3. History of stage I prostate cancer.  CURRENT THERAPY: 1. Aranesp 300 mcg subcu as needed for hemoglobin less than 10. 2. IV iron as indicated.  INTERIM HISTORY:  [name] comes in for his followup.  This actually is the best that I have seen him look in about 3 or 4 months.  We have been giving him IV iron.  We gave him Aranesp when we s he still has urinary incontinence from his prostate surgery.   His diabetes continues to be a problem.  He is not really able to control his blood sugars all that well.  He has not noticed any black stool.   He feels more energetic.  He has a better appetite.  He does have some pelvic pain.  He did see his urologist a week or so ago.  Nothing was found to be unusual.  PHYSICAL EXAMINATION:  General:  This is a fairly well-developed, well- nourished African American gentleman in no obvious distress.  Vital Signs:  Temperature of 97.5 pulse 104, respiratory rate 18, blood pressure 116/61 weight is 161 pounds.  Head and Neck:  Normocephalic, atraumatic skull.  There are no ocular or oral lesions.  There are no palpable cervical or supraclavicular lymph nodes.  Lungs:  Clear to percussion and auscultation bilaterally.  Cardiac:  Regular rate and rhythm with normal S1 and S2.  There are no murmurs, rubs, or bruits. Abdomen:  Soft.  He has good bowel sounds.  He has well-healed laparoscopy scars.  There is no palpable abdominal mass.  There is no palpable hepatosplenomegaly.  Extremities:  Show no clubbing, cyanosis, or edema.  Neurological:  Shows no focal neurological deficits.  Skin: No rashes, ecchymosis, or petechiae.  LABORATORY STUDIES:  White cell count is 4.6, hemoglobin 12.5 hematocrit 35.4, platelet count 242.[name] Kitchen  MCV is 87  Blood sugar is 245. Ferritin is 595 with an  iron saturation of 35%. His potassium is 2.8.  IMPRESSION:  [name] is a nice 68 year old African American gentleman. He has iron-deficiency anemia.  He has history of multiple GI bleed.  Again, I am just so thankful that he looks as well as he does.  We will go ahead and give him 510 mg of Feraheme today.  I think this will help.  We will give him some insulin.  His potassium is only 2.8.  I gave him a prescription for oral potassium.  These are 20 mEq doses.  I told him to take it 2 a day for 5 days and then 1 a day.  I will plan to get him back to see me in another 6 weeks weeks or so.

## 2013-06-23 ENCOUNTER — Telehealth: Payer: Self-pay | Admitting: Family

## 2013-06-23 NOTE — Telephone Encounter (Signed)
Pt is requesting to speak to nurse regarding DM shoes, see prior phone note. They have not received records from our office.

## 2013-06-23 NOTE — Telephone Encounter (Signed)
[facility]

## 2013-06-24 NOTE — Telephone Encounter (Signed)
Form and office note reprinted and faxed to [phone]. Notified pt.

## 2013-07-27 ENCOUNTER — Ambulatory Visit (HOSPITAL_BASED_OUTPATIENT_CLINIC_OR_DEPARTMENT_OTHER): Payer: Medicare Other | Admitting: Hematology & Oncology

## 2013-07-27 ENCOUNTER — Other Ambulatory Visit (HOSPITAL_BASED_OUTPATIENT_CLINIC_OR_DEPARTMENT_OTHER): Payer: Medicare Other | Admitting: Lab

## 2013-07-27 ENCOUNTER — Ambulatory Visit: Payer: Medicare Other

## 2013-07-27 VITALS — BP 135/73 | HR 102 | Temp 97.6°F | Resp 16

## 2013-07-27 DIAGNOSIS — D631 Anemia in chronic kidney disease: Secondary | ICD-10-CM

## 2013-07-27 DIAGNOSIS — D509 Iron deficiency anemia, unspecified: Secondary | ICD-10-CM

## 2013-07-27 DIAGNOSIS — IMO0002 Reserved for concepts with insufficient information to code with codable children: Secondary | ICD-10-CM

## 2013-07-27 DIAGNOSIS — E1165 Type 2 diabetes mellitus with hyperglycemia: Secondary | ICD-10-CM

## 2013-07-27 DIAGNOSIS — N189 Chronic kidney disease, unspecified: Secondary | ICD-10-CM

## 2013-07-27 LAB — CMP (CANCER CENTER ONLY)
ALT: 17 U/L (ref 10–47)
AST: 16 U/L (ref 11–38)
Albumin: 2.6 g/dL — ABNORMAL LOW (ref 3.3–5.5)
Alkaline Phosphatase: 110 U/L — ABNORMAL HIGH (ref 26–84)
BILIRUBIN TOTAL: 0.7 mg/dL (ref 0.20–1.60)
BUN: 15 mg/dL (ref 7–22)
CO2: 31 mEq/L (ref 18–33)
Calcium: 8.8 mg/dL (ref 8.0–10.3)
Chloride: 104 mEq/L (ref 98–108)
Creat: 1.2 mg/dl (ref 0.6–1.2)
Glucose, Bld: 48 mg/dL — ABNORMAL LOW (ref 73–118)
Potassium: 3.7 mEq/L (ref 3.3–4.7)
Sodium: 138 mEq/L (ref 128–145)
Total Protein: 6.3 g/dL — ABNORMAL LOW (ref 6.4–8.1)

## 2013-07-27 LAB — CBC WITH DIFFERENTIAL (CANCER CENTER ONLY)
BASO#: 0 10*3/uL (ref 0.0–0.2)
BASO%: 0.6 % (ref 0.0–2.0)
EOS ABS: 0.1 10*3/uL (ref 0.0–0.5)
EOS%: 3 % (ref 0.0–7.0)
HCT: 33.6 % — ABNORMAL LOW (ref 38.7–49.9)
HEMOGLOBIN: 11.8 g/dL — AB (ref 13.0–17.1)
LYMPH#: 1.2 10*3/uL (ref 0.9–3.3)
LYMPH%: 26.3 % (ref 14.0–48.0)
MCH: 31.6 pg (ref 28.0–33.4)
MCHC: 35.1 g/dL (ref 32.0–35.9)
MCV: 90 fL (ref 82–98)
MONO#: 0.8 10*3/uL (ref 0.1–0.9)
MONO%: 16.1 % — ABNORMAL HIGH (ref 0.0–13.0)
NEUT%: 54 % (ref 40.0–80.0)
NEUTROS ABS: 2.5 10*3/uL (ref 1.5–6.5)
Platelets: 271 10*3/uL (ref 145–400)
RBC: 3.73 10*6/uL — ABNORMAL LOW (ref 4.20–5.70)
RDW: 13.2 % (ref 11.1–15.7)
WBC: 4.7 10*3/uL (ref 4.0–10.0)

## 2013-07-27 LAB — IRON AND TIBC CHCC
%SAT: 39 % (ref 20–55)
Iron: 80 ug/dL (ref 42–163)
TIBC: 206 ug/dL (ref 202–409)
UIBC: 126 ug/dL (ref 117–376)

## 2013-07-27 LAB — FERRITIN CHCC: FERRITIN: 228 ng/mL (ref 22–316)

## 2013-07-27 LAB — CHCC SATELLITE - SMEAR

## 2013-07-27 NOTE — Progress Notes (Signed)
Pt seen by Dr. Marin Olp, no treatment advised today.

## 2013-07-28 NOTE — Progress Notes (Signed)
Hematology and Oncology Follow Up Visit  ZAK GONDEK [id number] 1945/11/25 68 y.o. 07/28/2013   Principle Diagnosis:  . Recurrent iron-deficiency anemia secondary to gastrointestinal     bleeding. 2. Anemia of renal insufficiency secondary to poorly controlled     diabetes. 3. History of stage I prostate cancer.  Current Therapy:   1. Aranesp 300 mcg subcu as needed for hemoglobin less than 10. 2. IV iron as indicated.     Interim History:  Mr.  Smitherman is back for followup. Yet she is doing quite well. Last done back in March.  He does feel a bit unsteady. He does have diabetes which typically has been under poor control. He says blood sugar this morning was 158. Practice not noted any bleeding. He's been no melena or bright red blood per rectum.  We last saw him in March, his ferritin was 595 with saturation of 35%.  He still has some incontinence from his prostatectomy.  He's had no headache. There's been no double vision or blurred vision.  Medications: Current outpatient prescriptions:atorvastatin (LIPITOR) 80 MG tablet, TAKE 1 TABLET EVERY DAY, Disp: 30 tablet, Rfl: 2;  gabapentin (NEURONTIN) 300 MG capsule, Take 1 capsule (300 mg total) by mouth at bedtime., Disp: 30 capsule, Rfl: 3;  hydrALAZINE (APRESOLINE) 10 MG tablet, TAKE 1 TABLET BY MOUTH EVERY 8 HOURS, Disp: 90 tablet, Rfl: 2;  insulin glargine (LANTUS) 100 UNIT/ML injection, Inject 20 Units into the skin at bedtime. , Disp: , Rfl:  insulin lispro (HUMALOG) 100 UNIT/ML injection, Inject 5 Units into the skin 3 (three) times daily before meals. Sliding scale, Disp: , Rfl: ;  nitroGLYCERIN (NITROSTAT) 0.4 MG SL tablet, Place 1 tablet (0.4 mg total) under the tongue every 5 (five) minutes as needed for chest pain (up to 3 doses)., Disp: 25 tablet, Rfl: 2;  pantoprazole (PROTONIX) 40 MG tablet, Take 1 tablet (40 mg total) by mouth daily., Disp: 30 tablet, Rfl: 3 polyvinyl alcohol (LIQUIFILM TEARS) 1.4 % ophthalmic solution, Place  1 drop into both eyes daily as needed (dry eyes)., Disp: , Rfl: ;  potassium chloride SA (KLOR-CON M20) 20 MEQ tablet, Take 2 tablets (40 mEq total) by mouth once. Take 2 pills a day, Disp: 60 tablet, Rfl: 6  Allergies: No Known Allergies  Past Medical History, Surgical history, Social history, and Family History were reviewed and updated.  Review of Systems: As above  Physical Exam:  oral temperature is 97.6 F (36.4 C). His blood pressure is 135/73 and his pulse is 102. His respiration is 16.   Well-developed and well-nourished after [place]. Head and neck exam shows no ocular or oral lesions. Has no scleritis. There is no adenopathy in the neck. Lungs are clear. Cardiac exam regular rate and rhythm with a normal S1 and S2. There are no murmurs rubs or bruits. Abdomen is soft. Has good bowel sounds. There is no fluid wave. There is no palpable liver or spleen tip. I exam no tenderness over the spine ribs or hips. Extremities shows no clubbing cyanosis or edema. Skin exam no rashes.  Lab Results  Component Value Date   WBC 4.7 07/27/2013   HGB 11.8* 07/27/2013   HCT 33.6* 07/27/2013   MCV 90 07/27/2013   PLT 271 07/27/2013     Chemistry      Component Value Date/Time   NA 138 07/27/2013 0818   NA 136 03/08/2013 1135   K 3.7 07/27/2013 0818   K 3.6 03/08/2013 1135   CL  104 07/27/2013 0818   CL 102 03/08/2013 1135   CO2 31 07/27/2013 0818   CO2 27 03/08/2013 1135   BUN 15 07/27/2013 0818   BUN 10 03/08/2013 1135   CREATININE 1.2 07/27/2013 0818   CREATININE 1.14 03/08/2013 1135      Component Value Date/Time   CALCIUM 8.8 07/27/2013 0818   CALCIUM 8.6 03/08/2013 1135   ALKPHOS 110* 07/27/2013 0818   ALKPHOS 102 05/24/2013 0915   AST 16 07/27/2013 0818   AST 11 05/24/2013 0915   ALT 17 07/27/2013 0818   ALT 9 05/24/2013 0915   BILITOT 0.70 07/27/2013 0818   BILITOT 0.5 05/24/2013 0915      Ferritin is 228. Iron saturation is 39%. Total iron is 80.   Impression and Plan: Mr. Ledwell is  68 year old gentleman. He has iron deficiency anemia does recurrent because of GI bleeding. He also has a low erythropoietin level because of poorly controlled diabetes.  Surprisingly, his blood count has been doing well. He does not need any iron or Aranesp today.  I am not sure as to why he has the dizziness. We did check his glucose. It was quite low. This actually is very surprising. We told him to drink some orange juice. He needs to keep going to his endocrinologist to have his blood sugars monitored and his insulin adjusted.  We'll plan to get him back in one month for followup.   Volanda Napoleon, MD 5/14/201512:32 PM

## 2013-08-24 ENCOUNTER — Encounter: Payer: Self-pay | Admitting: Hematology & Oncology

## 2013-08-24 ENCOUNTER — Other Ambulatory Visit (HOSPITAL_BASED_OUTPATIENT_CLINIC_OR_DEPARTMENT_OTHER): Payer: Medicare Other | Admitting: Lab

## 2013-08-24 ENCOUNTER — Ambulatory Visit (HOSPITAL_BASED_OUTPATIENT_CLINIC_OR_DEPARTMENT_OTHER)
Admission: RE | Admit: 2013-08-24 | Discharge: 2013-08-24 | Disposition: A | Discharge status: Home / Self Care | Payer: Medicare Other | Source: Ambulatory Visit | Attending: Hematology & Oncology | Admitting: Hematology & Oncology

## 2013-08-24 ENCOUNTER — Ambulatory Visit: Payer: Medicare Other

## 2013-08-24 ENCOUNTER — Ambulatory Visit (HOSPITAL_BASED_OUTPATIENT_CLINIC_OR_DEPARTMENT_OTHER): Payer: Medicare Other | Admitting: Hematology & Oncology

## 2013-08-24 VITALS — BP 147/71 | HR 89 | Temp 97.8°F | Resp 18 | Ht 67.0 in | Wt 160.0 lb

## 2013-08-24 DIAGNOSIS — E1142 Type 2 diabetes mellitus with diabetic polyneuropathy: Secondary | ICD-10-CM | POA: Insufficient documentation

## 2013-08-24 DIAGNOSIS — D509 Iron deficiency anemia, unspecified: Secondary | ICD-10-CM

## 2013-08-24 DIAGNOSIS — N189 Chronic kidney disease, unspecified: Secondary | ICD-10-CM

## 2013-08-24 DIAGNOSIS — M79609 Pain in unspecified limb: Secondary | ICD-10-CM | POA: Insufficient documentation

## 2013-08-24 DIAGNOSIS — E1149 Type 2 diabetes mellitus with other diabetic neurological complication: Secondary | ICD-10-CM | POA: Diagnosis not present

## 2013-08-24 DIAGNOSIS — N289 Disorder of kidney and ureter, unspecified: Secondary | ICD-10-CM

## 2013-08-24 DIAGNOSIS — D500 Iron deficiency anemia secondary to blood loss (chronic): Secondary | ICD-10-CM

## 2013-08-24 DIAGNOSIS — E1165 Type 2 diabetes mellitus with hyperglycemia: Secondary | ICD-10-CM

## 2013-08-24 DIAGNOSIS — D649 Anemia, unspecified: Secondary | ICD-10-CM

## 2013-08-24 DIAGNOSIS — D631 Anemia in chronic kidney disease: Secondary | ICD-10-CM

## 2013-08-24 DIAGNOSIS — I82409 Acute embolism and thrombosis of unspecified deep veins of unspecified lower extremity: Secondary | ICD-10-CM

## 2013-08-24 DIAGNOSIS — Z8546 Personal history of malignant neoplasm of prostate: Secondary | ICD-10-CM

## 2013-08-24 DIAGNOSIS — K922 Gastrointestinal hemorrhage, unspecified: Secondary | ICD-10-CM

## 2013-08-24 DIAGNOSIS — E119 Type 2 diabetes mellitus without complications: Secondary | ICD-10-CM

## 2013-08-24 DIAGNOSIS — IMO0002 Reserved for concepts with insufficient information to code with codable children: Secondary | ICD-10-CM

## 2013-08-24 DIAGNOSIS — Z87891 Personal history of nicotine dependence: Secondary | ICD-10-CM | POA: Diagnosis not present

## 2013-08-24 LAB — IRON AND TIBC CHCC
%SAT: 27 % (ref 20–55)
Iron: 53 ug/dL (ref 42–163)
TIBC: 199 ug/dL — AB (ref 202–409)
UIBC: 145 ug/dL (ref 117–376)

## 2013-08-24 LAB — CBC WITH DIFFERENTIAL (CANCER CENTER ONLY)
BASO#: 0 10*3/uL (ref 0.0–0.2)
BASO%: 0.6 % (ref 0.0–2.0)
EOS%: 2.1 % (ref 0.0–7.0)
Eosinophils Absolute: 0.1 10*3/uL (ref 0.0–0.5)
HCT: 34.5 % — ABNORMAL LOW (ref 38.7–49.9)
HEMOGLOBIN: 12.2 g/dL — AB (ref 13.0–17.1)
LYMPH#: 0.9 10*3/uL (ref 0.9–3.3)
LYMPH%: 17.3 % (ref 14.0–48.0)
MCH: 31.8 pg (ref 28.0–33.4)
MCHC: 35.4 g/dL (ref 32.0–35.9)
MCV: 90 fL (ref 82–98)
MONO#: 0.7 10*3/uL (ref 0.1–0.9)
MONO%: 13.7 % — ABNORMAL HIGH (ref 0.0–13.0)
NEUT%: 66.3 % (ref 40.0–80.0)
NEUTROS ABS: 3.5 10*3/uL (ref 1.5–6.5)
Platelets: 265 10*3/uL (ref 145–400)
RBC: 3.84 10*6/uL — ABNORMAL LOW (ref 4.20–5.70)
RDW: 12.9 % (ref 11.1–15.7)
WBC: 5.3 10*3/uL (ref 4.0–10.0)

## 2013-08-24 LAB — RETICULOCYTES (CHCC)
ABS Retic: 106.1 10*3/uL (ref 19.0–186.0)
RBC.: 3.79 MIL/uL — ABNORMAL LOW (ref 4.22–5.81)
RETIC CT PCT: 2.8 % — AB (ref 0.4–2.3)

## 2013-08-24 LAB — CMP (CANCER CENTER ONLY)
ALBUMIN: 2.5 g/dL — AB (ref 3.3–5.5)
ALK PHOS: 108 U/L — AB (ref 26–84)
ALT(SGPT): 13 U/L (ref 10–47)
AST: 19 U/L (ref 11–38)
BUN: 11 mg/dL (ref 7–22)
CALCIUM: 8.8 mg/dL (ref 8.0–10.3)
CO2: 31 mEq/L (ref 18–33)
CREATININE: 1.3 mg/dL — AB (ref 0.6–1.2)
Chloride: 99 mEq/L (ref 98–108)
GLUCOSE: 223 mg/dL — AB (ref 73–118)
POTASSIUM: 3.3 meq/L (ref 3.3–4.7)
Sodium: 138 mEq/L (ref 128–145)
Total Bilirubin: 0.6 mg/dl (ref 0.20–1.60)
Total Protein: 6.7 g/dL (ref 6.4–8.1)

## 2013-08-24 LAB — FERRITIN CHCC: Ferritin: 110 ng/ml (ref 22–316)

## 2013-08-25 NOTE — Progress Notes (Signed)
Hematology and Oncology Follow Up Visit  [name] [id number] 09-14-1945 68 y.o. 08/25/2013   Principle Diagnosis:  Recurrent iron-deficiency anemia secondary to gastrointestinal     bleeding. 2. Anemia of renal insufficiency secondary to poorly controlled     diabetes. 3. History of stage I prostate cancer.  Current Therapy:   1. Aranesp 300 mcg subcu as needed for hemoglobin less than 10. 2. IV iron as indicated.     Interim History:  [name] is back for followup. He actually looks quite good. He's had no problems with bleeding. He says that his blood sugars are doing better.  There's been no abdominal pain. He's had no cough. He is still smoking. He is trying to cut back. I talked to him about this.  We last saw him back in May. At that time, his ferritin was 2028. His concentration was 39%.  He's had no leg swelling. He's had no rashes. There's been no pain. Medications: Current outpatient prescriptions:atorvastatin (LIPITOR) 80 MG tablet, TAKE 1 TABLET EVERY DAY, Disp: 30 tablet, Rfl: 2;  gabapentin (NEURONTIN) 300 MG capsule, Take 1 capsule (300 mg total) by mouth at bedtime., Disp: 30 capsule, Rfl: 3;  hydrALAZINE (APRESOLINE) 10 MG tablet, TAKE 1 TABLET BY MOUTH EVERY 8 HOURS, Disp: 90 tablet, Rfl: 2;  insulin glargine (LANTUS) 100 UNIT/ML injection, Inject 25 Units into the skin at bedtime. , Disp: , Rfl:  insulin lispro (HUMALOG) 100 UNIT/ML injection, Inject 8 Units into the skin 3 (three) times daily before meals. Sliding scale, Disp: , Rfl: ;  nitroGLYCERIN (NITROSTAT) 0.4 MG SL tablet, Place 1 tablet (0.4 mg total) under the tongue every 5 (five) minutes as needed for chest pain (up to 3 doses)., Disp: 25 tablet, Rfl: 2;  pantoprazole (PROTONIX) 40 MG tablet, Take 1 tablet (40 mg total) by mouth daily., Disp: 30 tablet, Rfl: 3 polyvinyl alcohol (LIQUIFILM TEARS) 1.4 % ophthalmic solution, Place 1 drop into both eyes daily as needed (dry eyes)., Disp: , Rfl: ;  potassium  chloride SA (KLOR-CON M20) 20 MEQ tablet, Take 2 tablets (40 mEq total) by mouth once. Take 2 pills a day, Disp: 60 tablet, Rfl: 6  Allergies: No Known Allergies  Past Medical History, Surgical history, Social history, and Family History were reviewed and updated.  Review of Systems: As above  Physical Exam:  height is 5\' 7"  (1.702 m) and weight is 160 lb (72.576 kg). His oral temperature is 97.8 F (36.6 C). His blood pressure is 147/71 and his pulse is 89. His respiration is 18.   Lungs are clear. Cardiac exam regular rate and rhythm with no murmurs rubs or bruits. Abdomen is soft. Has good bowel sounds. He has well-healed laparoscopy scars. There is no fluid wave. There is no palpable liver or spleen tip. Back exam no tenderness over the spine ribs or hips. Extremities shows no clubbing cyanosis or edema. Skin exam no rashes. Neurological exam is nonfocal.  Lab Results  Component Value Date   WBC 5.3 08/24/2013   HGB 12.2* 08/24/2013   HCT 34.5* 08/24/2013   MCV 90 08/24/2013   PLT 265 08/24/2013     Chemistry      Component Value Date/Time   NA 138 08/24/2013 0809   NA 136 03/08/2013 1135   K 3.3 08/24/2013 0809   K 3.6 03/08/2013 1135   CL 99 08/24/2013 0809   CL 102 03/08/2013 1135   CO2 31 08/24/2013 0809   CO2 27 03/08/2013 1135  BUN 11 08/24/2013 0809   BUN 10 03/08/2013 1135   CREATININE 1.3* 08/24/2013 0809   CREATININE 1.14 03/08/2013 1135      Component Value Date/Time   CALCIUM 8.8 08/24/2013 0809   CALCIUM 8.6 03/08/2013 1135   ALKPHOS 108* 08/24/2013 0809   ALKPHOS 102 05/24/2013 0915   AST 19 08/24/2013 0809   AST 11 05/24/2013 0915   ALT 13 08/24/2013 0809   ALT 9 05/24/2013 0915   BILITOT 0.60 08/24/2013 0809   BILITOT 0.5 05/24/2013 0915      Ferritin is 110. Iron saturation is 27%. Total iron is 53.   Impression and Plan: [name] is see [name]. He has recurrent iron deficiency anemia from GI bleeding. His hemoglobin is to quite  well right now. His iron is dropping a little bit but I don't think we have to give him any iron right now.  As always, his big problem will be diabetes. This still is not under good control. I am not sure how much he is going to help this.  I will plan to get him back to see me in another 6 weeks or so.   Troy Napoleon, MD 6/11/20157:16 AM

## 2013-08-26 ENCOUNTER — Other Ambulatory Visit: Payer: Self-pay | Admitting: Internal Medicine

## 2013-08-26 ENCOUNTER — Other Ambulatory Visit: Payer: Self-pay | Admitting: Family

## 2013-08-26 NOTE — Telephone Encounter (Signed)
Needs appt for further refills.

## 2013-09-06 ENCOUNTER — Ambulatory Visit: Payer: Medicare Other | Admitting: Family

## 2013-09-07 ENCOUNTER — Encounter: Payer: Self-pay | Admitting: Family

## 2013-09-07 ENCOUNTER — Telehealth: Payer: Self-pay | Admitting: Family

## 2013-09-07 ENCOUNTER — Ambulatory Visit (INDEPENDENT_AMBULATORY_CARE_PROVIDER_SITE_OTHER): Payer: Medicare Other | Admitting: Family

## 2013-09-07 VITALS — BP 120/80 | HR 95 | Temp 97.7°F | Ht 67.0 in | Wt 159.5 lb

## 2013-09-07 DIAGNOSIS — E1165 Type 2 diabetes mellitus with hyperglycemia: Secondary | ICD-10-CM

## 2013-09-07 DIAGNOSIS — N289 Disorder of kidney and ureter, unspecified: Secondary | ICD-10-CM

## 2013-09-07 DIAGNOSIS — M7918 Myalgia, other site: Secondary | ICD-10-CM

## 2013-09-07 DIAGNOSIS — IMO0002 Reserved for concepts with insufficient information to code with codable children: Secondary | ICD-10-CM

## 2013-09-07 DIAGNOSIS — D509 Iron deficiency anemia, unspecified: Secondary | ICD-10-CM

## 2013-09-07 DIAGNOSIS — C61 Malignant neoplasm of prostate: Secondary | ICD-10-CM

## 2013-09-07 DIAGNOSIS — IMO0001 Reserved for inherently not codable concepts without codable children: Secondary | ICD-10-CM

## 2013-09-07 DIAGNOSIS — E119 Type 2 diabetes mellitus without complications: Secondary | ICD-10-CM

## 2013-09-07 DIAGNOSIS — E785 Hyperlipidemia, unspecified: Secondary | ICD-10-CM

## 2013-09-07 LAB — HEMOGLOBIN A1C
Hgb A1c MFr Bld: 10.6 % — ABNORMAL HIGH (ref <5.7)
MEAN PLASMA GLUCOSE: 258 mg/dL — AB (ref <117)

## 2013-09-07 LAB — LIPID PANEL
CHOLESTEROL: 236 mg/dL — AB (ref 0–200)
HDL: 35 mg/dL — ABNORMAL LOW (ref >39)
LDL Cholesterol: 167 mg/dL — ABNORMAL HIGH (ref 0–99)
TRIGLYCERIDES: 172 mg/dL — AB (ref <150)
Total CHOL/HDL Ratio: 6.7 Ratio
VLDL: 34 mg/dL (ref 0–40)

## 2013-09-07 MED ORDER — INSULIN GLARGINE 100 UNIT/ML ~~LOC~~ SOLN: 25.0000 [IU] | Freq: Every day | SUBCUTANEOUS | Status: DC

## 2013-09-07 NOTE — Telephone Encounter (Signed)
Patient states that the insulin that was called in today was supposed to be in a pen, not a vial. He would like a callback when this has been sent.

## 2013-09-07 NOTE — Progress Notes (Signed)
Subjective:    Patient ID: [name], male    DOB: 10/25/1945, 68 y.o.   MRN: [id number]  HPI  [name] is a 68 yr old male who presents today for follow up.  1) Iron deficiency Anemia-  Followed by Dr. Marin Olp. Denies black/bloody stools.  Lab Results  Component Value Date   WBC 5.3 08/24/2013   HGB 12.2* 08/24/2013   HCT 34.5* 08/24/2013   MCV 90 08/24/2013   PLT 265 08/24/2013    2) Renal insufficiency-  Reports normal urination.  Lab Results  Component Value Date   CREATININE 1.3* 08/24/2013    3) Hyperlipidemia- Reports compliance with lipitor. Denies unusual myalgia.  Lab Results  Component Value Date   CHOL 183 02/21/2013   HDL 34* 02/21/2013   LDLCALC 124* 02/21/2013   TRIG 127 02/21/2013   CHOLHDL 5.4 02/21/2013   4) DM2- reports that he checks his sugar at least 2 x a day.  Reports sugars 120-145, occasionally 200.  He is using humalog 8 units TID.  He is taking 25 units of lantus at bedtime. He has not followed back with endocrinology.  Lab Results  Component Value Date   HGBA1C 10.9* 05/24/2013   5) Prostate Cancer- follows with Dr. Risa Grill- scheduled to see him next month.    Review of Systems    Reports right leg pain x 1 month- had neg [place].   Past Medical History  Diagnosis Date  . Hypertension   . High cholesterol   . GERD (gastroesophageal reflux disease)   . GI bleed     a. Recurrent GI bleed, tx with IV iron. b. Per heme notes - likely AVMs 04/2012 (tx with cauterization several months ago).  . Orthostatic hypotension     a. Tx with florinef.  . Hematuria     a. Urology note scan from 07/2012: cystoscopy without evidence for bladder lesion, only lateral hypertrophy of posterior urethra, bladder impression from BPH. b. Pt states he had "some tests" scheduled for later in July 2014.  . Stage III chronic kidney disease     a. Stage 3 (DM with complications ->CKD, peripheral neuropathy).  . Anemia, iron deficiency 03/24/2011    a. Recurrent GI bleed, tx  with periodic iron infusions.  . 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 Sleep apnea     "had mask; couldn't sleep in it" (09/20/2012)  . Type II diabetes mellitus     a. Dx 1994, uncontrolled.   . Diabetic peripheral neuropathy 2014    foot pain.  . Chronic lower back pain   . Fatty liver     on CT of 11/2010  . AVM (arteriovenous malformation) of duodenum, acquired     egds in 01/2012, 04/2011  . Polyp, colonic     Colonoscopy 01/2012 "benign" polyp  . CAD (coronary artery disease)     a. Cath 09/2012: moderate borderline CAD in mid LAD/small diagonal branch, mild RCA stenosis, to be managed medically   . BPH (benign prostatic hyperplasia)   . Prostate cancer   . Stroke   . Peripheral neuropathy     History   Social History  . Marital Status: Married    Spouse Name: N/A    Number of Children: 2  . Years of Education: N/A   Occupational History  . taken out of work due to back    Social History Main Topics  . Smoking status: Former Smoker -- 1.00 packs/day for 43 years  Types: Cigarettes    Start date: 04/15/1968    Quit date: 04/18/2011  . Smokeless tobacco: Never Used     Comment: quit 2 years ago  . Alcohol Use: No     Comment: 09/20/2012 "Used to; stopped ~ 2009; never had problem w/it"  . Drug Use: No  . Sexual Activity: No   Other Topics Concern  . Not on file   Social History Narrative   Regular exercise: rides horses   Caffeine use: occasionally    Past Surgical History  Procedure Laterality Date  . Shoulder open rotator cuff repair Left 1980's  . Inguinal hernia repair Right 2011  . Lumbar disc surgery  1980's  . Robot assisted laparoscopic radical prostatectomy N/A 01/19/2013    Procedure: ROBOTIC ASSISTED LAPAROSCOPIC RADICAL PROSTATECTOMY;  Surgeon: Bernestine Amass, MD;  Location: [facility];  Service: Urology;  Laterality: N/A;  . Lymphadenectomy Bilateral 01/19/2013    Procedure: LYMPHADENECTOMY;  Surgeon: Bernestine Amass, MD;  Location: [facility];  Service:  Urology;  Laterality: Bilateral;  . Enteroscopy N/A 03/01/2013    Procedure: ENTEROSCOPY;  Surgeon: Inda Castle, MD;  Location: [facility];  Service: Endoscopy;  Laterality: N/A;    Family History  Problem Relation Age of Onset  . Heart attack Brother     Died at 40  . Stroke Father     Died at 69  . Diabetes Sister   . Diabetes Mother   . Stomach cancer Brother   . Heart attack Sister     No Known Allergies  Current Outpatient Prescriptions on File Prior to Visit  Medication Sig Dispense Refill  . atorvastatin (LIPITOR) 80 MG tablet TAKE 1 TABLET BY MOUTH EVERY DAY  30 tablet  2  . gabapentin (NEURONTIN) 300 MG capsule Take 1 capsule (300 mg total) by mouth at bedtime.  30 capsule  3  . HUMALOG 100 UNIT/ML injection USE AS ADVISED - MAXIMUM 25 UNITS PER DAY. INJECT 7-7-5 UNITS PLUS SSI AS ADVISED.  10 mL  0  . hydrALAZINE (APRESOLINE) 10 MG tablet TAKE 1 TABLET BY MOUTH EVERY 8 HOURS  90 tablet  2  . insulin lispro (HUMALOG) 100 UNIT/ML injection Inject 8 Units into the skin 3 (three) times daily before meals. Sliding scale      . nitroGLYCERIN (NITROSTAT) 0.4 MG SL tablet Place 1 tablet (0.4 mg total) under the tongue every 5 (five) minutes as needed for chest pain (up to 3 doses).  25 tablet  2  . pantoprazole (PROTONIX) 40 MG tablet Take 1 tablet (40 mg total) by mouth daily.  30 tablet  3  . polyvinyl alcohol (LIQUIFILM TEARS) 1.4 % ophthalmic solution Place 1 drop into both eyes daily as needed (dry eyes).      . potassium chloride SA (KLOR-CON M20) 20 MEQ tablet Take 2 tablets (40 mEq total) by mouth once. Take 2 pills a day  60 tablet  6   No current facility-administered medications on file prior to visit.    BP 120/80  Pulse 95  Temp(Src) 97.7 F (36.5 C) (Oral)  Ht 5\' 7"  (1.702 m)  Wt 159 lb 8 oz (72.349 kg)  BMI 24.98 kg/m2  SpO2 98%    Objective:   Physical Exam  Constitutional: He is oriented to person, place, and time. He appears well-developed and  well-nourished. No distress.  Cardiovascular: Normal rate and regular rhythm.   No murmur heard. Pulses:      Dorsalis pedis pulses are  2+ on the right side, and 2+ on the left side.       Posterior tibial pulses are 2+ on the right side, and 2+ on the left side.  Pulmonary/Chest: Effort normal and breath sounds normal. No respiratory distress. He has no wheezes. He has no rales. He exhibits no tenderness.  Musculoskeletal: He exhibits no edema.  Right calf, non-tender, non-swollen  Neurological: He is alert and oriented to person, place, and time.  Skin: Skin is warm and dry.  Psychiatric: He has a normal mood and affect. His behavior is normal. Judgment and thought content normal.          Assessment & Plan:

## 2013-09-07 NOTE — Patient Instructions (Addendum)
Complete labs prior to leaving. Start aspirin 81mg  once daily.  You will be contact about your referral to podiatry and endocrinology. Please let us know if you have not heard back within 1 week about your referral. Please schedule a follow up appointment in 3 months.

## 2013-09-07 NOTE — Progress Notes (Signed)
Pre visit review using our clinic review tool, if applicable. No additional management support is needed unless otherwise documented below in the visit note. 

## 2013-09-07 NOTE — Telephone Encounter (Signed)
Message copied by [name] on Wed Sep 07, 2013 12:03 PM ------      Message from: Burney Gauze R      Created: Wed Sep 07, 2013 10:53 AM       Melissa:            Aspirin is ok with me!!!            Laurey Arrow ------

## 2013-09-08 ENCOUNTER — Telehealth: Payer: Self-pay | Admitting: Family

## 2013-09-08 DIAGNOSIS — E785 Hyperlipidemia, unspecified: Secondary | ICD-10-CM

## 2013-09-08 LAB — MICROALBUMIN / CREATININE URINE RATIO
Creatinine, Urine: 181.7 mg/dL
MICROALB/CREAT RATIO: 2913.4 mg/g — AB (ref 0.0–30.0)
Microalb, Ur: 529.37 mg/dL — ABNORMAL HIGH (ref 0.00–1.89)

## 2013-09-08 MED ORDER — INSULIN GLARGINE 100 UNIT/ML SOLOSTAR PEN: 25.0000 [IU] | PEN_INJECTOR | Freq: Every day | SUBCUTANEOUS | Status: DC

## 2013-09-08 NOTE — Telephone Encounter (Signed)
solostar pen sent.

## 2013-09-08 NOTE — Telephone Encounter (Signed)
Left message on work #for cb. Per DPR.

## 2013-09-08 NOTE — Telephone Encounter (Signed)
Lab work shows diabetes is uncontrolled and that it is causing damage to his kidneys.  I would like him to contact Dr. Cruzita Lederer his endocrinologist to arrange follow up appointment.  In the meantime, increase lantus to 30 units each night. Also, cholesterol has worsened.  Please confirm that he is taking lipitor every night.  If so, we need to change him to crestor 20mg  once daily #30 with 1 refill, follow up FLP/LFT in 6 weeks.  If he is not taking lipitor every night, it is important that he resume.

## 2013-09-08 NOTE — Telephone Encounter (Signed)
Please advise 

## 2013-09-09 MED ORDER — ROSUVASTATIN CALCIUM 20 MG PO TABS: 20.0000 mg | ORAL_TABLET | Freq: Every day | ORAL | Status: DC

## 2013-09-09 NOTE — Telephone Encounter (Signed)
Notified pt and he voices understanding. States he has been taking Lipitor consistently. New rx for Crestor sent to pharmacy. Future lab orders entered.

## 2013-09-09 NOTE — Telephone Encounter (Signed)
LMOM with contact name and number RE: solostar pen per pt request/SLS

## 2013-09-11 ENCOUNTER — Encounter: Payer: Self-pay | Admitting: Family

## 2013-09-11 DIAGNOSIS — M25551 Pain in right hip: Secondary | ICD-10-CM | POA: Insufficient documentation

## 2013-09-11 DIAGNOSIS — N289 Disorder of kidney and ureter, unspecified: Secondary | ICD-10-CM | POA: Insufficient documentation

## 2013-09-11 NOTE — Assessment & Plan Note (Signed)
Lab Results  Component Value Date   CHOL 236* 09/07/2013   HDL 35* 09/07/2013   LDLCALC 167* 09/07/2013   TRIG 172* 09/07/2013   CHOLHDL 6.7 09/07/2013   LDL above goal. D/C lipitor, start crestor.

## 2013-09-11 NOTE — Assessment & Plan Note (Signed)
This is followed by Dr. Marin Olp.  CBC is stable.  Will resume aspirin 81 mg for cardiovascular protection.

## 2013-09-11 NOTE — Assessment & Plan Note (Signed)
Likely musculoskeletal pain.  If symptoms persist, consider ABIs.

## 2013-09-11 NOTE — Assessment & Plan Note (Signed)
S/p prostatectomy.  Management per Urology.

## 2013-09-11 NOTE — Assessment & Plan Note (Signed)
Lab Results  Component Value Date   CREATININE 1.3* 08/24/2013   Kidney function stable, continue to monitor.

## 2013-09-11 NOTE — Assessment & Plan Note (Signed)
Lab Results  Component Value Date   HGBA1C 10.6* 09/07/2013   A1C shows poor diabetic control.  Increase lantus from 25 units to 30 units nightly.  Pt advised to arrange follow up with endocrinology.

## 2013-09-13 ENCOUNTER — Encounter: Payer: Self-pay | Admitting: Podiatry

## 2013-09-13 ENCOUNTER — Ambulatory Visit (INDEPENDENT_AMBULATORY_CARE_PROVIDER_SITE_OTHER): Payer: Medicare Other | Admitting: Podiatry

## 2013-09-13 VITALS — BP 174/97 | HR 84 | Ht 67.0 in | Wt 160.0 lb

## 2013-09-13 DIAGNOSIS — M7750 Other enthesopathy of unspecified foot: Secondary | ICD-10-CM

## 2013-09-13 DIAGNOSIS — M7742 Metatarsalgia, left foot: Principal | ICD-10-CM

## 2013-09-13 DIAGNOSIS — M79606 Pain in leg, unspecified: Secondary | ICD-10-CM | POA: Insufficient documentation

## 2013-09-13 DIAGNOSIS — M79609 Pain in unspecified limb: Secondary | ICD-10-CM

## 2013-09-13 DIAGNOSIS — E1142 Type 2 diabetes mellitus with diabetic polyneuropathy: Secondary | ICD-10-CM

## 2013-09-13 DIAGNOSIS — E1349 Other specified diabetes mellitus with other diabetic neurological complication: Secondary | ICD-10-CM

## 2013-09-13 DIAGNOSIS — E0842 Diabetes mellitus due to underlying condition with diabetic polyneuropathy: Secondary | ICD-10-CM

## 2013-09-13 DIAGNOSIS — M216X1 Other acquired deformities of right foot: Secondary | ICD-10-CM

## 2013-09-13 DIAGNOSIS — M7741 Metatarsalgia, right foot: Secondary | ICD-10-CM

## 2013-09-13 DIAGNOSIS — M216X9 Other acquired deformities of unspecified foot: Secondary | ICD-10-CM

## 2013-09-13 NOTE — Patient Instructions (Addendum)
Seen for painful feet.  Examination reveal tight Achilles tendon on right, high arched foot with stiffness of joints in rearfoot and mid foot. Do daily stretch exercise for tightness on right. May benefit from Custom orthotics or Diabetic shoes.  Return to prepare for Orthotics or Diabetic shoes.

## 2013-09-13 NOTE — Progress Notes (Signed)
Subjective: 68 year old male presents complaining of pain in both feet, R>L. Blood sugar fluctuates. Been diabetic for 4-5 years. Feet tingles and burns.  Has been on Gabapentin for about a year and not helping.   Review of Systems - General ROS: negative for - chills, fatigue, fever, night sweats, sleep disturbance, weight gain or weight loss Ophthalmic ROS: Having problem with eye with eye lid drying out. Been checked out.  ENT ROS: negative Allergy and Immunology ROS: Seasonal. Respiratory ROS: no cough, shortness of breath, or wheezing Cardiovascular ROS: no chest pain or dyspnea on exertion Gastrointestinal ROS: no abdominal pain, change in bowel habits, or black or bloody stools Genito-Urinary ROS: no dysuria, trouble voiding, or hematuria Musculoskeletal ROS: Right calf hurts all the time. Been check out for blood clot and was negative.  Neurological ROS: Has nervousness with bad nerves.  Dermatological ROS: Bone with dry leathery skin.  Objective: Dermatologic: Has multiple areas of discolored and hyperpigmented areas, hereditary. All nails presents, thick yellow and deformed.  Vascular: All pedal pulses are palpable. No edema or erythema noted. Neurologic: Decreased response to Monofilament sensory testing both forefoot. Normal on Vibratory sensations.  Orthopedic: Supinated Cavus type foot with contracted 2nd digits bilateral.  Tight Achilles tendon right.  Limited Subtalar joint range of motion with stiffness of forefoot and rearfoot.   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 Right Ankle equinus. Lacking shock absorption ability during contact phase due to limited joint motion rearfoot and midfoot bilateral.   Plan:  Reviewed findings and available options.  Do daily stretch exercise for tightness on right. May benefit from Custom orthotics or Diabetic shoes to help with shock absorption at lesser MPJ area.  Return to prepare for Orthotics or Diabetic shoes.

## 2013-09-14 ENCOUNTER — Telehealth: Payer: Self-pay | Admitting: *Deleted

## 2013-09-14 NOTE — Telephone Encounter (Signed)
Received Statement of Certifying Physician to be completed and faxed along with office note to [facility] at [phone].  Form forwarded to Provider for completion.

## 2013-09-19 NOTE — Telephone Encounter (Signed)
Form and note was faxed on 09/14/13 at 5:30 pm.

## 2013-09-20 ENCOUNTER — Encounter: Payer: Self-pay | Admitting: Podiatry

## 2013-09-20 ENCOUNTER — Ambulatory Visit (INDEPENDENT_AMBULATORY_CARE_PROVIDER_SITE_OTHER): Payer: Medicare Other | Admitting: Podiatry

## 2013-09-20 VITALS — BP 168/92 | HR 101 | Ht 67.0 in | Wt 160.0 lb

## 2013-09-20 DIAGNOSIS — M216X9 Other acquired deformities of unspecified foot: Secondary | ICD-10-CM

## 2013-09-20 DIAGNOSIS — E1165 Type 2 diabetes mellitus with hyperglycemia: Secondary | ICD-10-CM

## 2013-09-20 DIAGNOSIS — M7742 Metatarsalgia, left foot: Secondary | ICD-10-CM

## 2013-09-20 DIAGNOSIS — IMO0002 Reserved for concepts with insufficient information to code with codable children: Secondary | ICD-10-CM

## 2013-09-20 DIAGNOSIS — M79609 Pain in unspecified limb: Secondary | ICD-10-CM

## 2013-09-20 DIAGNOSIS — E1142 Type 2 diabetes mellitus with diabetic polyneuropathy: Secondary | ICD-10-CM

## 2013-09-20 DIAGNOSIS — E1149 Type 2 diabetes mellitus with other diabetic neurological complication: Secondary | ICD-10-CM

## 2013-09-20 DIAGNOSIS — M7750 Other enthesopathy of unspecified foot: Secondary | ICD-10-CM

## 2013-09-20 DIAGNOSIS — E0842 Diabetes mellitus due to underlying condition with diabetic polyneuropathy: Secondary | ICD-10-CM

## 2013-09-20 DIAGNOSIS — M7741 Metatarsalgia, right foot: Secondary | ICD-10-CM

## 2013-09-20 NOTE — Patient Instructions (Signed)
Both feet measured for diabetic shoes. 

## 2013-09-20 NOTE — Progress Notes (Signed)
Patient came in to have both feet measured for diabetic shoes. Both feet measured for diabetic shoes.

## 2013-10-10 ENCOUNTER — Telehealth: Payer: Self-pay | Admitting: *Deleted

## 2013-10-10 NOTE — Telephone Encounter (Signed)
Received fax from [facility] requesting order for diabetic testing supplies. Per previous documentation of 06/08/12 pt is using [employer]. Left message for pt to return my call and verify request / diabetic supply company.

## 2013-10-12 ENCOUNTER — Ambulatory Visit (INDEPENDENT_AMBULATORY_CARE_PROVIDER_SITE_OTHER): Payer: Medicare Other | Admitting: Family

## 2013-10-12 ENCOUNTER — Ambulatory Visit (HOSPITAL_BASED_OUTPATIENT_CLINIC_OR_DEPARTMENT_OTHER)
Admission: RE | Admit: 2013-10-12 | Discharge: 2013-10-12 | Disposition: A | Discharge status: Home / Self Care | Payer: Medicare Other | Source: Ambulatory Visit | Attending: Family | Admitting: Family

## 2013-10-12 ENCOUNTER — Encounter: Payer: Self-pay | Admitting: Family

## 2013-10-12 VITALS — BP 150/86 | HR 85 | Temp 97.7°F | Resp 18 | Ht 67.0 in | Wt 161.0 lb

## 2013-10-12 DIAGNOSIS — M545 Low back pain, unspecified: Secondary | ICD-10-CM

## 2013-10-12 DIAGNOSIS — M4710 Other spondylosis with myelopathy, site unspecified: Secondary | ICD-10-CM | POA: Diagnosis not present

## 2013-10-12 DIAGNOSIS — I10 Essential (primary) hypertension: Secondary | ICD-10-CM

## 2013-10-12 MED ORDER — TRAMADOL HCL 50 MG PO TABS
50.0000 mg | ORAL_TABLET | Freq: Three times a day (TID) | ORAL | Status: DC | PRN
Start: 2013-10-12 — End: 2014-10-06

## 2013-10-12 MED ORDER — LISINOPRIL 10 MG PO TABS: 10.0000 mg | ORAL_TABLET | Freq: Every day | ORAL | Status: DC

## 2013-10-12 NOTE — Patient Instructions (Signed)
Complete x ray of your back on the first floor. You may use tramadol as needed for pain- use caution as this may cause drowsiness.  When pain eases up, switch to tylenol as needed. Start lisinopril for your blood pressure. You will be contacted about your referral to physical therapy.  Follow up in 2 weeks.

## 2013-10-12 NOTE — Progress Notes (Signed)
Subjective:    Patient ID: [name], male    DOB: 1945/12/24, 68 y.o.   MRN: [id number]  HPI  [name] is a 68 yr old male who presents today with chief complaint of left low back pain.  Pain has been present x 1 week.  Reports that in the past he has had back pain on the right side.  Pain is non-radiating and is not associated with weakness.  Denies bowel/bladder incontinence.  He has tried a muscle relaxer which was given by his "kidney doctor."  This has not helped his pain. Pain is worst when he lays down at night.  Worst when he gets up in the AM.  Denies dysuria, frequency or urgency.    Elevated BP-  BP is elevated today.  Current BP meds include hydralazine TID.  Denies CP SOB or swelling.   BP Readings from Last 3 Encounters:  10/12/13 150/86  09/20/13 168/92  09/13/13 174/97     Review of Systems    see HPI  Past Medical History  Diagnosis Date  . Hypertension   . High cholesterol   . GERD (gastroesophageal reflux disease)   . GI bleed     a. Recurrent GI bleed, tx with IV iron. b. Per heme notes - likely AVMs 04/2012 (tx with cauterization several months ago).  . Orthostatic hypotension     a. Tx with florinef.  . Hematuria     a. Urology note scan from 07/2012: cystoscopy without evidence for bladder lesion, only lateral hypertrophy of posterior urethra, bladder impression from BPH. b. Pt states he had "some tests" scheduled for later in July 2014.  . Stage III chronic kidney disease     a. Stage 3 (DM with complications ->CKD, peripheral neuropathy).  . Anemia, iron deficiency 03/24/2011    a. Recurrent GI bleed, tx with periodic iron infusions.  . 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 Sleep apnea     "had mask; couldn't sleep in it" (09/20/2012)  . Type II diabetes mellitus     a. Dx 1994, uncontrolled.   . Diabetic peripheral neuropathy 2014    foot pain.  . Chronic lower back pain   . Fatty liver     on CT of 11/2010  . AVM (arteriovenous malformation) of  duodenum, acquired     egds in 01/2012, 04/2011  . Polyp, colonic     Colonoscopy 01/2012 "benign" polyp  . CAD (coronary artery disease)     a. Cath 09/2012: moderate borderline CAD in mid LAD/small diagonal branch, mild RCA stenosis, to be managed medically   . BPH (benign prostatic hyperplasia)   . Prostate cancer   . Stroke   . Peripheral neuropathy   . 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 Marital Status: Married    Spouse Name: N/A    Number of Children: 2  . Years of Education: N/A   Occupational History  . taken out of work due to back    Social History Main Topics  . Smoking status: Current Some Day Smoker -- 1.00 packs/day for 43 years    Types: Cigarettes    Start date: 04/15/1968  . Smokeless tobacco: Never Used     Comment: quit 2 years ago  . Alcohol Use: No     Comment: 09/20/2012 "Used to; stopped ~ 2009; never had problem w/it"  . Drug Use: No  . Sexual Activity: No  Other Topics Concern  . Not on file   Social History Narrative   Regular exercise: rides horses   Caffeine use: occasionally    Past Surgical History  Procedure Laterality Date  . Shoulder open rotator cuff repair Left 1980's  . Inguinal hernia repair Right 2011  . Lumbar disc surgery  1980's  . Robot assisted laparoscopic radical prostatectomy N/A 01/19/2013    Procedure: ROBOTIC ASSISTED LAPAROSCOPIC RADICAL PROSTATECTOMY;  Surgeon: Bernestine Amass, MD;  Location: [facility];  Service: Urology;  Laterality: N/A;  . Lymphadenectomy Bilateral 01/19/2013    Procedure: LYMPHADENECTOMY;  Surgeon: Bernestine Amass, MD;  Location: [facility];  Service: Urology;  Laterality: Bilateral;  . Enteroscopy N/A 03/01/2013    Procedure: ENTEROSCOPY;  Surgeon: Inda Castle, MD;  Location: [facility];  Service: Endoscopy;  Laterality: N/A;    Family History  Problem Relation Age of Onset  . Heart attack Brother     Died at 33  . Stroke Father     Died at 59    . Diabetes Sister   . Diabetes Mother   . Stomach cancer Brother   . Heart attack Sister     No Known Allergies  Current Outpatient Prescriptions on File Prior to Visit  Medication Sig Dispense Refill  . aspirin EC 81 MG tablet Take 81 mg by mouth daily.      [name] Kitchen gabapentin (NEURONTIN) 300 MG capsule Take 1 capsule (300 mg total) by mouth at bedtime.  30 capsule  3  . HUMALOG 100 UNIT/ML injection USE AS ADVISED - MAXIMUM 25 UNITS PER DAY. INJECT 7-7-5 UNITS PLUS SSI AS ADVISED.  10 mL  0  . hydrALAZINE (APRESOLINE) 10 MG tablet TAKE 1 TABLET BY MOUTH EVERY 8 HOURS  90 tablet  2  . Insulin Glargine (LANTUS) 100 UNIT/ML Solostar Pen Inject 10 Units into the skin every morning.       . nitroGLYCERIN (NITROSTAT) 0.4 MG SL tablet Place 1 tablet (0.4 mg total) under the tongue every 5 (five) minutes as needed for chest pain (up to 3 doses).  25 tablet  2  . pantoprazole (PROTONIX) 40 MG tablet Take 1 tablet (40 mg total) by mouth daily.  30 tablet  3  . polyvinyl alcohol (LIQUIFILM TEARS) 1.4 % ophthalmic solution Place 1 drop into both eyes daily as needed (dry eyes).      . potassium chloride SA (KLOR-CON M20) 20 MEQ tablet Take 2 tablets (40 mEq total) by mouth once. Take 2 pills a day  60 tablet  6  . rosuvastatin (CRESTOR) 20 MG tablet Take 1 tablet (20 mg total) by mouth daily.  30 tablet  1   No current facility-administered medications on file prior to visit.    BP 150/86  Pulse 85  Temp(Src) 97.7 F (36.5 C) (Oral)  Resp 18  Ht 5\' 7"  (1.702 m)  Wt 161 lb (73.029 kg)  BMI 25.21 kg/m2  SpO2 96%    Objective:   Physical Exam  Constitutional: He is oriented to person, place, and time. He appears well-developed and well-nourished. No distress.  Cardiovascular: Normal rate and regular rhythm.   No murmur heard. Pulmonary/Chest: Effort normal and breath sounds normal. No respiratory distress. He has no wheezes. He has no rales. He exhibits no tenderness.  Abdominal: There is no  CVA tenderness.  Musculoskeletal:       Cervical back: He exhibits no bony tenderness.       Thoracic back: He exhibits  no bony tenderness.       Lumbar back: He exhibits no bony tenderness.  Neurological: He is alert and oriented to person, place, and time.  Reflex Scores:      Patellar reflexes are 1+ on the right side and 1+ on the left side. Steady even gait.           Assessment & Plan:

## 2013-10-12 NOTE — Progress Notes (Signed)
Pre visit review using our clinic review tool, if applicable. No additional management support is needed unless otherwise documented below in the visit note. 

## 2013-10-12 NOTE — Assessment & Plan Note (Signed)
BP remains uncontrolled. Trial of lisinopril for BP control and renal protection.

## 2013-10-12 NOTE — Assessment & Plan Note (Signed)
Deteriorated.  Will avoid nsaid due to renal insufficiency and steroids due to DM.  Will obtain x ray of the lumbar spine, refer to PT and and prn tramadol for short term use.

## 2013-10-12 NOTE — Telephone Encounter (Signed)
Verified with pt that he is not requesting supplies from [facility].

## 2013-10-17 ENCOUNTER — Encounter: Payer: Self-pay | Admitting: Family

## 2013-10-19 ENCOUNTER — Ambulatory Visit: Payer: Medicare Other | Attending: Family | Admitting: Rehabilitation

## 2013-10-19 DIAGNOSIS — M545 Low back pain, unspecified: Secondary | ICD-10-CM | POA: Insufficient documentation

## 2013-10-19 DIAGNOSIS — IMO0001 Reserved for inherently not codable concepts without codable children: Secondary | ICD-10-CM | POA: Diagnosis present

## 2013-10-19 DIAGNOSIS — I10 Essential (primary) hypertension: Secondary | ICD-10-CM | POA: Diagnosis not present

## 2013-10-19 DIAGNOSIS — E119 Type 2 diabetes mellitus without complications: Secondary | ICD-10-CM | POA: Diagnosis not present

## 2013-10-21 ENCOUNTER — Ambulatory Visit: Payer: Medicare Other | Admitting: Rehabilitation

## 2013-10-21 DIAGNOSIS — IMO0001 Reserved for inherently not codable concepts without codable children: Secondary | ICD-10-CM | POA: Diagnosis not present

## 2013-10-24 ENCOUNTER — Ambulatory Visit: Payer: Medicare Other | Admitting: Rehabilitation

## 2013-10-24 ENCOUNTER — Telehealth: Payer: Self-pay

## 2013-10-24 DIAGNOSIS — IMO0001 Reserved for inherently not codable concepts without codable children: Secondary | ICD-10-CM | POA: Diagnosis not present

## 2013-10-24 NOTE — Telephone Encounter (Signed)
Pt had BP done on 10-12-13, LDL done on 09-07-13, and A1C done on 09-07-13  Too early to recheck labs

## 2013-10-27 ENCOUNTER — Encounter: Payer: Self-pay | Admitting: Hematology & Oncology

## 2013-10-27 ENCOUNTER — Ambulatory Visit (HOSPITAL_BASED_OUTPATIENT_CLINIC_OR_DEPARTMENT_OTHER): Payer: Medicare Other | Admitting: Hematology & Oncology

## 2013-10-27 ENCOUNTER — Other Ambulatory Visit (HOSPITAL_BASED_OUTPATIENT_CLINIC_OR_DEPARTMENT_OTHER): Payer: Medicare Other | Admitting: Lab

## 2013-10-27 ENCOUNTER — Ambulatory Visit (HOSPITAL_BASED_OUTPATIENT_CLINIC_OR_DEPARTMENT_OTHER): Payer: Medicare Other

## 2013-10-27 VITALS — BP 147/72 | HR 103 | Temp 97.5°F | Resp 18 | Ht 67.0 in | Wt 159.0 lb

## 2013-10-27 VITALS — BP 153/84 | HR 84 | Temp 96.8°F | Resp 18

## 2013-10-27 DIAGNOSIS — D631 Anemia in chronic kidney disease: Secondary | ICD-10-CM

## 2013-10-27 DIAGNOSIS — IMO0002 Reserved for concepts with insufficient information to code with codable children: Secondary | ICD-10-CM

## 2013-10-27 DIAGNOSIS — N189 Chronic kidney disease, unspecified: Secondary | ICD-10-CM

## 2013-10-27 DIAGNOSIS — D509 Iron deficiency anemia, unspecified: Secondary | ICD-10-CM

## 2013-10-27 DIAGNOSIS — N039 Chronic nephritic syndrome with unspecified morphologic changes: Secondary | ICD-10-CM

## 2013-10-27 DIAGNOSIS — D649 Anemia, unspecified: Secondary | ICD-10-CM

## 2013-10-27 DIAGNOSIS — E1165 Type 2 diabetes mellitus with hyperglycemia: Secondary | ICD-10-CM

## 2013-10-27 DIAGNOSIS — IMO0001 Reserved for inherently not codable concepts without codable children: Secondary | ICD-10-CM

## 2013-10-27 LAB — CMP (CANCER CENTER ONLY)
ALT(SGPT): 13 U/L (ref 10–47)
AST: 20 U/L (ref 11–38)
Albumin: 2.7 g/dL — ABNORMAL LOW (ref 3.3–5.5)
Alkaline Phosphatase: 108 U/L — ABNORMAL HIGH (ref 26–84)
BUN, Bld: 18 mg/dL (ref 7–22)
CALCIUM: 8.7 mg/dL (ref 8.0–10.3)
CHLORIDE: 97 meq/L — AB (ref 98–108)
CO2: 29 meq/L (ref 18–33)
Creat: 1.6 mg/dl — ABNORMAL HIGH (ref 0.6–1.2)
Glucose, Bld: 235 mg/dL — ABNORMAL HIGH (ref 73–118)
POTASSIUM: 4.5 meq/L (ref 3.3–4.7)
SODIUM: 141 meq/L (ref 128–145)
TOTAL PROTEIN: 6.9 g/dL (ref 6.4–8.1)
Total Bilirubin: 0.5 mg/dl (ref 0.20–1.60)

## 2013-10-27 LAB — CBC WITH DIFFERENTIAL (CANCER CENTER ONLY)
BASO#: 0 10*3/uL (ref 0.0–0.2)
BASO%: 0.4 % (ref 0.0–2.0)
EOS ABS: 0.1 10*3/uL (ref 0.0–0.5)
EOS%: 1.6 % (ref 0.0–7.0)
HEMATOCRIT: 31 % — AB (ref 38.7–49.9)
HEMOGLOBIN: 10.5 g/dL — AB (ref 13.0–17.1)
LYMPH#: 1.2 10*3/uL (ref 0.9–3.3)
LYMPH%: 15.2 % (ref 14.0–48.0)
MCH: 31.4 pg (ref 28.0–33.4)
MCHC: 33.9 g/dL (ref 32.0–35.9)
MCV: 93 fL (ref 82–98)
MONO#: 0.8 10*3/uL (ref 0.1–0.9)
MONO%: 10.4 % (ref 0.0–13.0)
NEUT%: 72.4 % (ref 40.0–80.0)
NEUTROS ABS: 5.8 10*3/uL (ref 1.5–6.5)
Platelets: 347 10*3/uL (ref 145–400)
RBC: 3.34 10*6/uL — AB (ref 4.20–5.70)
RDW: 13.2 % (ref 11.1–15.7)
WBC: 8.1 10*3/uL (ref 4.0–10.0)

## 2013-10-27 LAB — RETICULOCYTES (CHCC)
ABS Retic: 107.2 10*3/uL (ref 19.0–186.0)
RBC.: 3.35 MIL/uL — AB (ref 4.22–5.81)
Retic Ct Pct: 3.2 % — ABNORMAL HIGH (ref 0.4–2.3)

## 2013-10-27 LAB — FERRITIN: FERRITIN: 32 ng/mL (ref 22–322)

## 2013-10-27 LAB — IRON AND TIBC
%SAT: 15 % — ABNORMAL LOW (ref 20–55)
Iron: 44 ug/dL (ref 42–165)
TIBC: 287 ug/dL (ref 215–435)
UIBC: 243 ug/dL (ref 125–400)

## 2013-10-27 MED ORDER — SODIUM CHLORIDE 0.9 % IV SOLN
Freq: Once | INTRAVENOUS | Status: AC
  Administered 2013-10-27: 10:00:00 via INTRAVENOUS

## 2013-10-27 MED ORDER — SODIUM CHLORIDE 0.9 % IV SOLN
510.0000 mg | Freq: Once | INTRAVENOUS | Status: AC
  Administered 2013-10-27: 510 mg via INTRAVENOUS
  Filled 2013-10-27: qty 17

## 2013-10-27 MED ORDER — FERUMOXYTOL INJECTION 510 MG/17 ML: 510.0000 mg | Freq: Once | INTRAVENOUS | Status: DC

## 2013-10-27 NOTE — Patient Instructions (Signed)

## 2013-10-27 NOTE — Progress Notes (Signed)
Hematology and Oncology Follow Up Visit  CREWE HEATHMAN [id number] Sep 04, 1945 68 y.o. 10/27/2013   Principle Diagnosis:  Recurrent iron-deficiency anemia secondary to gastrointestinal     bleeding. 2. Anemia of renal insufficiency secondary to poorly controlled     diabetes. 3. History of stage I prostate cancer.  Current Therapy:   1. Aranesp 300 mcg subcu as needed for hemoglobin less than 10. 2. IV iron as indicated.     Interim History:  Mr.  Geurin is back for followup. He feels a little more tired. I suspect that his iron started to drop.  He still has problems with his blood sugars. He's had this morning, his blood sugar was 219.  He's had some back and leg issues. He had x-rays of his lower back recently. He has significant arthritis. I suspect that he probably has spinal stenosis that is causing the leg issues. He's taking some physical therapy.  When we last saw him, his ferritin was 110 with an iron saturation of 27%. The left side he got iron was back in March.  He's had no problems his prostate cancer surgery. He does not have any incontinence. He does not had to see the urologist for 6 months.  He does have some tingling in his hands and feet. I suspect that this likely is diabetic neuropathy.  Medications: Current outpatient prescriptions:aspirin EC 81 MG tablet, Take 81 mg by mouth daily., Disp: , Rfl: ;  gabapentin (NEURONTIN) 300 MG capsule, Take 1 capsule (300 mg total) by mouth at bedtime., Disp: 30 capsule, Rfl: 3;  HUMALOG 100 UNIT/ML injection, USE AS ADVISED - MAXIMUM 25 UNITS PER DAY. INJECT 7-7-5 UNITS PLUS SSI AS ADVISED., Disp: 10 mL, Rfl: 0 hydrALAZINE (APRESOLINE) 10 MG tablet, TAKE 1 TABLET BY MOUTH EVERY 8 HOURS, Disp: 90 tablet, Rfl: 2;  Insulin Glargine (LANTUS) 100 UNIT/ML Solostar Pen, Inject 10 Units into the skin every morning. , Disp: , Rfl: ;  lisinopril (PRINIVIL,ZESTRIL) 10 MG tablet, Take 1 tablet (10 mg total) by mouth daily., Disp: 30 tablet, Rfl:  0 nitroGLYCERIN (NITROSTAT) 0.4 MG SL tablet, Place 1 tablet (0.4 mg total) under the tongue every 5 (five) minutes as needed for chest pain (up to 3 doses)., Disp: 25 tablet, Rfl: 2;  pantoprazole (PROTONIX) 40 MG tablet, Take 1 tablet (40 mg total) by mouth daily., Disp: 30 tablet, Rfl: 3;  polyvinyl alcohol (LIQUIFILM TEARS) 1.4 % ophthalmic solution, Place 1 drop into both eyes daily as needed (dry eyes)., Disp: , Rfl:  potassium chloride SA (KLOR-CON M20) 20 MEQ tablet, Take 2 tablets (40 mEq total) by mouth once. Take 2 pills a day, Disp: 60 tablet, Rfl: 6;  rosuvastatin (CRESTOR) 20 MG tablet, Take 1 tablet (20 mg total) by mouth daily., Disp: 30 tablet, Rfl: 1;  traMADol (ULTRAM) 50 MG tablet, Take 1 tablet (50 mg total) by mouth every 8 (eight) hours as needed., Disp: 30 tablet, Rfl: 0  Allergies: No Known Allergies  Past Medical History, Surgical history, Social history, and Family History were reviewed and updated.  Review of Systems: As above  Physical Exam:  height is 5\' 7"  (1.702 m) and weight is 159 lb (72.122 kg). His oral temperature is 97.5 F (36.4 C). His blood pressure is 147/72 and his pulse is 103. His respiration is 18.   Well-developed and well-nourished African American done. Head and neck exam shows no ocular or oral lesions. He has no palpable cervical or supraclavicular lymph nodes. Lungs are clear.  Cardiac exam regular in rhythm with no murmurs rubs or bruits. Abdomen is soft. He has good bowel sounds. There is no fluid wave. There is a palpable liver or spleen tip. Back exam shows no tenderness over the spine ribs or hips. Extremities shows no clubbing, cyanosis or edema. Has good range of motion of his joints. Has good strength in his legs. He has intact pulses in his distal fingers. Skin exam slightly dry. Neurological exam shows no focal neurological deficits.  Lab Results  Component Value Date   WBC 8.1 10/27/2013   HGB 10.5* 10/27/2013   HCT 31.0* 10/27/2013    MCV 93 10/27/2013   PLT 347 10/27/2013     Chemistry      Component Value Date/Time   NA 141 10/27/2013 0756   NA 136 03/08/2013 1135   K 4.5 10/27/2013 0756   K 3.6 03/08/2013 1135   CL 97* 10/27/2013 0756   CL 102 03/08/2013 1135   CO2 29 10/27/2013 0756   CO2 27 03/08/2013 1135   BUN 18 10/27/2013 0756   BUN 10 03/08/2013 1135   CREATININE 1.6* 10/27/2013 0756   CREATININE 1.14 03/08/2013 1135      Component Value Date/Time   CALCIUM 8.7 10/27/2013 0756   CALCIUM 8.6 03/08/2013 1135   ALKPHOS 108* 10/27/2013 0756   ALKPHOS 102 05/24/2013 0915   AST 20 10/27/2013 0756   AST 11 05/24/2013 0915   ALT 13 10/27/2013 0756   ALT 9 05/24/2013 0915   BILITOT 0.50 10/27/2013 0756   BILITOT 0.5 05/24/2013 0915         Impression and Plan: Mr. Isip is 68 year old gentleman with iron deficiency anemia. He has recurrent GI bleeding. Again, I suspect that his levels are getting lower again. We'll go ahead and give him IV iron. His blood sugars, as always, are quite high. It's hard to really get these under good control. He is trying aren't sure.  From my point of view, I don't think we had to do any Aranesp. His hemoglobin is not low enough.  I will go ahead and schedule him back in another 2 months.   Volanda Napoleon, MD 8/13/20159:22 AM

## 2013-10-28 ENCOUNTER — Ambulatory Visit (INDEPENDENT_AMBULATORY_CARE_PROVIDER_SITE_OTHER): Payer: Medicare Other | Admitting: Family

## 2013-10-28 ENCOUNTER — Ambulatory Visit: Payer: Medicare Other | Admitting: Rehabilitation

## 2013-10-28 ENCOUNTER — Encounter: Payer: Self-pay | Admitting: Family

## 2013-10-28 VITALS — BP 136/80 | HR 113 | Temp 98.0°F | Resp 18 | Ht 67.0 in | Wt 159.1 lb

## 2013-10-28 DIAGNOSIS — IMO0001 Reserved for inherently not codable concepts without codable children: Secondary | ICD-10-CM | POA: Diagnosis not present

## 2013-10-28 DIAGNOSIS — I10 Essential (primary) hypertension: Secondary | ICD-10-CM

## 2013-10-28 DIAGNOSIS — M79604 Pain in right leg: Secondary | ICD-10-CM

## 2013-10-28 DIAGNOSIS — M79609 Pain in unspecified limb: Secondary | ICD-10-CM

## 2013-10-28 NOTE — Progress Notes (Signed)
Pre visit review using our clinic review tool, if applicable. No additional management support is needed unless otherwise documented below in the visit note. 

## 2013-10-28 NOTE — Assessment & Plan Note (Addendum)
I suspect that this is secondary to his lumbar disc disease. Will arrange consult with neurosurgery.  Handicapped placard form is filled today.

## 2013-10-28 NOTE — Patient Instructions (Signed)
You will be contacted about your referral to the Neurosurgeon. Please follow up in 3 months, sooner if problems/concerns.

## 2013-10-28 NOTE — Assessment & Plan Note (Signed)
BP is improved today. Continue current meds.

## 2013-10-28 NOTE — Progress Notes (Signed)
Subjective:    Patient ID: [name], male    DOB: [birth year], 68 y.o.   MRN: [id number]  HPI  [name] is a 68 yr old male who presents today for blood pressure recheck as his BP was found to be outside of goal range last visit.  He also has some concerns.   1) HTN- he is maintained on hydralazine, and lisinopril.  BP Readings from Last 3 Encounters:  10/28/13 136/80  10/27/13 153/84  10/27/13 147/72   2) R leg pain- reports constant aching/weakness in the right calf since June.  Feels like the weakness is worsening and is not affecting his gait. Has hx of laminotomy. Had mri of the lumbar spin 12/03/13 noted degenerative disc disease and stenoses at L3-4.  Disc narrowing inferior foramen bilaterally R>L.     Review of Systems See HPI  Past Medical History  Diagnosis Date  . Hypertension   . High cholesterol   . GERD (gastroesophageal reflux disease)   . GI bleed     a. Recurrent GI bleed, tx with IV iron. b. Per heme notes - likely AVMs 04/2012 (tx with cauterization several months ago).  . Orthostatic hypotension     a. Tx with florinef.  . Hematuria     a. Urology note scan from 07/2012: cystoscopy without evidence for bladder lesion, only lateral hypertrophy of posterior urethra, bladder impression from BPH. b. Pt states he had "some tests" scheduled for later in July 2014.  . Stage III chronic kidney disease     a. Stage 3 (DM with complications ->CKD, peripheral neuropathy).  . Anemia, iron deficiency 03/24/2011    a. Recurrent GI bleed, tx with periodic iron infusions.  . 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 Sleep apnea     "had mask; couldn't sleep in it" (09/20/2012)  . Type II diabetes mellitus     a. Dx 1994, uncontrolled.   . Diabetic peripheral neuropathy 2014    foot pain.  . Chronic lower back pain   . Fatty liver     on CT of 11/2010  . AVM (arteriovenous malformation) of duodenum, acquired     egds in 01/2012, 04/2011  . Polyp, colonic     Colonoscopy  01/2012 "benign" polyp  . CAD (coronary artery disease)     a. Cath 09/2012: moderate borderline CAD in mid LAD/small diagonal branch, mild RCA stenosis, to be managed medically   . BPH (benign prostatic hyperplasia)   . Prostate cancer   . Stroke   . Peripheral neuropathy   . 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 Marital Status: Married    Spouse Name: N/A    Number of Children: 2  . Years of Education: N/A   Occupational History  . taken out of work due to back    Social History Main Topics  . Smoking status: Current Some Day Smoker -- 1.00 packs/day for 43 years    Types: Cigarettes    Start date: 04/15/1968  . Smokeless tobacco: Never Used     Comment: quit 2 years ago  . Alcohol Use: No     Comment: 09/20/2012 "Used to; stopped ~ 2009; never had problem w/it"  . Drug Use: No  . Sexual Activity: No   Other Topics Concern  . Not on file   Social History Narrative   Regular exercise: rides horses   Caffeine use: occasionally    Past  Surgical History  Procedure Laterality Date  . Shoulder open rotator cuff repair Left 1980's  . Inguinal hernia repair Right 2011  . Lumbar disc surgery  1980's  . Robot assisted laparoscopic radical prostatectomy N/A 01/19/2013    Procedure: ROBOTIC ASSISTED LAPAROSCOPIC RADICAL PROSTATECTOMY;  Surgeon: Bernestine Amass, MD;  Location: [facility];  Service: Urology;  Laterality: N/A;  . Lymphadenectomy Bilateral 01/19/2013    Procedure: LYMPHADENECTOMY;  Surgeon: Bernestine Amass, MD;  Location: [facility];  Service: Urology;  Laterality: Bilateral;  . Enteroscopy N/A 03/01/2013    Procedure: ENTEROSCOPY;  Surgeon: Inda Castle, MD;  Location: [facility];  Service: Endoscopy;  Laterality: N/A;    Family History  Problem Relation Age of Onset  . Heart attack Brother     Died at 74  . Stroke Father     Died at 4  . Diabetes Sister   . Diabetes Mother   . Stomach cancer Brother   . Heart attack  Sister     No Known Allergies  Current Outpatient Prescriptions on File Prior to Visit  Medication Sig Dispense Refill  . aspirin EC 81 MG tablet Take 81 mg by mouth daily.      [name] Kitchen gabapentin (NEURONTIN) 300 MG capsule Take 1 capsule (300 mg total) by mouth at bedtime.  30 capsule  3  . HUMALOG 100 UNIT/ML injection USE AS ADVISED - MAXIMUM 25 UNITS PER DAY. INJECT 7-7-5 UNITS PLUS SSI AS ADVISED.  10 mL  0  . hydrALAZINE (APRESOLINE) 10 MG tablet TAKE 1 TABLET BY MOUTH EVERY 8 HOURS  90 tablet  2  . Insulin Glargine (LANTUS) 100 UNIT/ML Solostar Pen Inject 10 Units into the skin every morning.       [name] Kitchen lisinopril (PRINIVIL,ZESTRIL) 10 MG tablet Take 1 tablet (10 mg total) by mouth daily.  30 tablet  0  . nitroGLYCERIN (NITROSTAT) 0.4 MG SL tablet Place 1 tablet (0.4 mg total) under the tongue every 5 (five) minutes as needed for chest pain (up to 3 doses).  25 tablet  2  . pantoprazole (PROTONIX) 40 MG tablet Take 1 tablet (40 mg total) by mouth daily.  30 tablet  3  . polyvinyl alcohol (LIQUIFILM TEARS) 1.4 % ophthalmic solution Place 1 drop into both eyes daily as needed (dry eyes).      . potassium chloride SA (KLOR-CON M20) 20 MEQ tablet Take 2 tablets (40 mEq total) by mouth once. Take 2 pills a day  60 tablet  6  . rosuvastatin (CRESTOR) 20 MG tablet Take 1 tablet (20 mg total) by mouth daily.  30 tablet  1  . traMADol (ULTRAM) 50 MG tablet Take 1 tablet (50 mg total) by mouth every 8 (eight) hours as needed.  30 tablet  0   No current facility-administered medications on file prior to visit.    BP 136/80  Pulse 113  Temp(Src) 98 F (36.7 C) (Oral)  Resp 18  Ht 5\' 7"  (1.702 m)  Wt 159 lb 1.9 oz (72.176 kg)  BMI 24.92 kg/m2  SpO2 94%       Objective:   Physical Exam  Constitutional: He is oriented to person, place, and time. He appears well-developed and well-nourished. No distress.  Cardiovascular: Normal rate and regular rhythm.   No murmur heard. Pulmonary/Chest:  Effort normal and breath sounds normal. No respiratory distress. He has no wheezes. He has no rales. He exhibits no tenderness.  Musculoskeletal: He exhibits no edema.  Neurological: He  is alert and oriented to person, place, and time.  Reflex Scores:      Bicep reflexes are 2+ on the right side and 2+ on the left side.      Brachioradialis reflexes are 2+ on the right side and 2+ on the left side.      Achilles reflexes are 0 on the right side and 0 on the left side. Bilateral LE strengthis 5/5.           Assessment & Plan:

## 2013-10-31 ENCOUNTER — Ambulatory Visit: Payer: Medicare Other | Admitting: Rehabilitation

## 2013-10-31 DIAGNOSIS — IMO0001 Reserved for inherently not codable concepts without codable children: Secondary | ICD-10-CM | POA: Diagnosis not present

## 2013-11-07 ENCOUNTER — Ambulatory Visit: Payer: Medicare Other | Admitting: Rehabilitation

## 2013-11-10 ENCOUNTER — Ambulatory Visit: Payer: Medicare Other | Admitting: Rehabilitation

## 2013-11-11 ENCOUNTER — Other Ambulatory Visit: Payer: Self-pay | Admitting: Neurosurgery

## 2013-11-11 DIAGNOSIS — M549 Dorsalgia, unspecified: Secondary | ICD-10-CM

## 2013-11-14 ENCOUNTER — Ambulatory Visit: Payer: Medicare Other | Admitting: Rehabilitation

## 2013-11-17 ENCOUNTER — Ambulatory Visit: Payer: Medicare Other | Admitting: Rehabilitation

## 2013-11-18 ENCOUNTER — Telehealth: Payer: Self-pay | Admitting: Family

## 2013-11-18 MED ORDER — ROSUVASTATIN CALCIUM 20 MG PO TABS: 20.0000 mg | ORAL_TABLET | Freq: Every day | ORAL | Status: DC

## 2013-11-18 NOTE — Telephone Encounter (Signed)
Caller name: Riddick  Relation to pt: self  Call back number: [phone]  Reason for call:   Pt needs clarification which medication rosuvastatin (CRESTOR) 20 MG tablet or Lipitor

## 2013-11-18 NOTE — Telephone Encounter (Signed)
Advised pt that he should be taking Crestor 20mg  daily and refills sent.

## 2013-12-04 ENCOUNTER — Other Ambulatory Visit: Payer: Self-pay | Admitting: Family

## 2013-12-05 NOTE — Telephone Encounter (Signed)
Rx request to pharmacy/SLS  

## 2013-12-11 ENCOUNTER — Other Ambulatory Visit: Payer: Self-pay | Admitting: Physician Assistant

## 2013-12-17 ENCOUNTER — Other Ambulatory Visit: Payer: Self-pay | Admitting: Family

## 2013-12-19 NOTE — Telephone Encounter (Signed)
eScribe request from [facility] for refill on Gabapentin Last filled - 03.24.15, #30x3 Last AEX - 08.14.15 Next AEX - 3 Mths Please Advise on refills/SLS

## 2013-12-29 ENCOUNTER — Other Ambulatory Visit (HOSPITAL_BASED_OUTPATIENT_CLINIC_OR_DEPARTMENT_OTHER): Payer: Medicare Other | Admitting: Lab

## 2013-12-29 ENCOUNTER — Ambulatory Visit (HOSPITAL_BASED_OUTPATIENT_CLINIC_OR_DEPARTMENT_OTHER): Payer: Medicare Other | Admitting: Family

## 2013-12-29 ENCOUNTER — Other Ambulatory Visit: Payer: Self-pay | Admitting: Nurse Practitioner

## 2013-12-29 ENCOUNTER — Ambulatory Visit (HOSPITAL_BASED_OUTPATIENT_CLINIC_OR_DEPARTMENT_OTHER): Payer: Medicare Other

## 2013-12-29 ENCOUNTER — Encounter: Payer: Self-pay | Admitting: Family

## 2013-12-29 VITALS — BP 122/70 | HR 94 | Temp 98.1°F | Resp 18 | Ht 67.0 in | Wt 160.0 lb

## 2013-12-29 DIAGNOSIS — N189 Chronic kidney disease, unspecified: Secondary | ICD-10-CM

## 2013-12-29 DIAGNOSIS — Z23 Encounter for immunization: Secondary | ICD-10-CM

## 2013-12-29 DIAGNOSIS — D500 Iron deficiency anemia secondary to blood loss (chronic): Secondary | ICD-10-CM | POA: Diagnosis not present

## 2013-12-29 DIAGNOSIS — D509 Iron deficiency anemia, unspecified: Secondary | ICD-10-CM

## 2013-12-29 DIAGNOSIS — D649 Anemia, unspecified: Secondary | ICD-10-CM

## 2013-12-29 DIAGNOSIS — E1165 Type 2 diabetes mellitus with hyperglycemia: Secondary | ICD-10-CM

## 2013-12-29 DIAGNOSIS — D631 Anemia in chronic kidney disease: Secondary | ICD-10-CM

## 2013-12-29 DIAGNOSIS — D508 Other iron deficiency anemias: Secondary | ICD-10-CM

## 2013-12-29 DIAGNOSIS — C61 Malignant neoplasm of prostate: Secondary | ICD-10-CM

## 2013-12-29 DIAGNOSIS — IMO0002 Reserved for concepts with insufficient information to code with codable children: Secondary | ICD-10-CM

## 2013-12-29 LAB — CBC WITH DIFFERENTIAL (CANCER CENTER ONLY)
BASO#: 0 10*3/uL (ref 0.0–0.2)
BASO%: 0.4 % (ref 0.0–2.0)
EOS%: 2.9 % (ref 0.0–7.0)
Eosinophils Absolute: 0.1 10*3/uL (ref 0.0–0.5)
HEMATOCRIT: 26 % — AB (ref 38.7–49.9)
HGB: 8.6 g/dL — ABNORMAL LOW (ref 13.0–17.1)
LYMPH#: 1 10*3/uL (ref 0.9–3.3)
LYMPH%: 20.5 % (ref 14.0–48.0)
MCH: 28.8 pg (ref 28.0–33.4)
MCHC: 33.1 g/dL (ref 32.0–35.9)
MCV: 87 fL (ref 82–98)
MONO#: 0.7 10*3/uL (ref 0.1–0.9)
MONO%: 14.6 % — ABNORMAL HIGH (ref 0.0–13.0)
NEUT#: 3 10*3/uL (ref 1.5–6.5)
NEUT%: 61.6 % (ref 40.0–80.0)
Platelets: 352 10*3/uL (ref 145–400)
RBC: 2.99 10*6/uL — ABNORMAL LOW (ref 4.20–5.70)
RDW: 12.7 % (ref 11.1–15.7)
WBC: 4.8 10*3/uL (ref 4.0–10.0)

## 2013-12-29 LAB — CMP (CANCER CENTER ONLY)
ALBUMIN: 2.3 g/dL — AB (ref 3.3–5.5)
ALT(SGPT): 11 U/L (ref 10–47)
AST: 12 U/L (ref 11–38)
Alkaline Phosphatase: 91 U/L — ABNORMAL HIGH (ref 26–84)
BUN: 13 mg/dL (ref 7–22)
CALCIUM: 8.5 mg/dL (ref 8.0–10.3)
CHLORIDE: 105 meq/L (ref 98–108)
CO2: 26 mEq/L (ref 18–33)
Creat: 1.5 mg/dl — ABNORMAL HIGH (ref 0.6–1.2)
Glucose, Bld: 165 mg/dL — ABNORMAL HIGH (ref 73–118)
Potassium: 3.9 mEq/L (ref 3.3–4.7)
SODIUM: 141 meq/L (ref 128–145)
TOTAL PROTEIN: 6.2 g/dL — AB (ref 6.4–8.1)
Total Bilirubin: 0.6 mg/dl (ref 0.20–1.60)

## 2013-12-29 LAB — RETICULOCYTES (CHCC)
ABS RETIC: 58.5 10*3/uL (ref 19.0–186.0)
RBC.: 3.08 MIL/uL — ABNORMAL LOW (ref 4.22–5.81)
Retic Ct Pct: 1.9 % (ref 0.4–2.3)

## 2013-12-29 LAB — IRON AND TIBC CHCC
%SAT: 7 % — ABNORMAL LOW (ref 20–55)
Iron: 18 ug/dL — ABNORMAL LOW (ref 42–163)
TIBC: 245 ug/dL (ref 202–409)
UIBC: 227 ug/dL (ref 117–376)

## 2013-12-29 LAB — FERRITIN CHCC: Ferritin: 23 ng/ml (ref 22–316)

## 2013-12-29 MED ORDER — DARBEPOETIN ALFA-POLYSORBATE 300 MCG/0.6ML IJ SOLN
INTRAMUSCULAR | Status: AC
  Filled 2013-12-29: qty 0.6

## 2013-12-29 MED ORDER — SODIUM CHLORIDE 0.9 % IV SOLN: Freq: Once | INTRAVENOUS | Status: DC

## 2013-12-29 MED ORDER — INFLUENZA VAC SPLIT QUAD 0.5 ML IM SUSY
0.5000 mL | PREFILLED_SYRINGE | Freq: Once | INTRAMUSCULAR | Status: AC
  Administered 2013-12-29: 0.5 mL via INTRAMUSCULAR
  Filled 2013-12-29: qty 0.5

## 2013-12-29 MED ORDER — DARBEPOETIN ALFA-POLYSORBATE 300 MCG/0.6ML IJ SOLN
300.0000 ug | Freq: Once | INTRAMUSCULAR | Status: AC
  Administered 2013-12-29: 300 ug via SUBCUTANEOUS

## 2013-12-29 MED ORDER — FERUMOXYTOL INJECTION 510 MG/17 ML: 510.0000 mg | Freq: Once | INTRAVENOUS | Status: DC

## 2013-12-29 MED ORDER — SODIUM CHLORIDE 0.9 % IV SOLN
Freq: Once | INTRAVENOUS | Status: AC
  Administered 2013-12-29: 09:00:00 via INTRAVENOUS
  Filled 2013-12-29: qty 100

## 2013-12-29 NOTE — Patient Instructions (Addendum)
Influenza Virus Vaccine injection (Fluarix) What is this medicine? INFLUENZA VIRUS VACCINE (in floo EN zuh VAHY ruhs vak SEEN) helps to reduce the risk of getting influenza also known as the flu. This medicine may be used for other purposes; ask your health care provider or pharmacist if you have questions. COMMON BRAND NAME(S): Fluarix, Fluzone What should I tell my health care provider before I take this medicine? They need to know if you have any of these conditions: -bleeding disorder like hemophilia -fever or infection -Guillain-Barre syndrome or other neurological problems -immune system problems -infection with the human immunodeficiency virus (HIV) or AIDS -low blood platelet counts -multiple sclerosis -an unusual or allergic reaction to influenza virus vaccine, eggs, chicken proteins, latex, gentamicin, other medicines, foods, dyes or preservatives -pregnant or trying to get pregnant -breast-feeding How should I use this medicine? This vaccine is for injection into a muscle. It is given by a health care professional. A copy of Vaccine Information Statements will be given before each vaccination. Read this sheet carefully each time. The sheet may change frequently. Talk to your pediatrician regarding the use of this medicine in children. Special care may be needed. Overdosage: If you think you have taken too much of this medicine contact a poison control center or emergency room at once. NOTE: This medicine is only for you. Do not share this medicine with others. What if I miss a dose? This does not apply. What may interact with this medicine? -chemotherapy or radiation therapy -medicines that lower your immune system like etanercept, anakinra, infliximab, and adalimumab -medicines that treat or prevent blood clots like warfarin -phenytoin -steroid medicines like prednisone or cortisone -theophylline -vaccines This list may not describe all possible interactions. Give your  health care provider a list of all the medicines, herbs, non-prescription drugs, or dietary supplements you use. Also tell them if you smoke, drink alcohol, or use illegal drugs. Some items may interact with your medicine. What should I watch for while using this medicine? Report any side effects that do not go away within 3 days to your doctor or health care professional. Call your health care provider if any unusual symptoms occur within 6 weeks of receiving this vaccine. You may still catch the flu, but the illness is not usually as bad. You cannot get the flu from the vaccine. The vaccine will not protect against colds or other illnesses that may cause fever. The vaccine is needed every year. What side effects may I notice from receiving this medicine? Side effects that you should report to your doctor or health care professional as soon as possible: -allergic reactions like skin rash, itching or hives, swelling of the face, lips, or tongue Side effects that usually do not require medical attention (report to your doctor or health care professional if they continue or are bothersome): -fever -headache -muscle aches and pains -pain, tenderness, redness, or swelling at site where injected -weak or tired This list may not describe all possible side effects. Call your doctor for medical advice about side effects. You may report side effects to FDA at 1-800-FDA-1088. Where should I keep my medicine? This vaccine is only given in a clinic, pharmacy, doctor's office, or other health care setting and will not be stored at home. NOTE: This sheet is a summary. It may not cover all possible information. If you have questions about this medicine, talk to your doctor, pharmacist, or health care provider.  2015, Elsevier/Gold Standard. (2007-09-29 09:30:40) Ferumoxytol injection What is this   medicine? FERUMOXYTOL is an iron complex. Iron is used to make healthy red blood cells, which carry oxygen and  nutrients throughout the body. This medicine is used to treat iron deficiency anemia in people with chronic kidney disease. This medicine may be used for other purposes; ask your health care provider or pharmacist if you have questions. COMMON BRAND NAME(S): Feraheme What should I tell my health care provider before I take this medicine? They need to know if you have any of these conditions: -anemia not caused by low iron levels -high levels of iron in the blood -magnetic resonance imaging (MRI) test scheduled -an unusual or allergic reaction to iron, other medicines, foods, dyes, or preservatives -pregnant or trying to get pregnant -breast-feeding How should I use this medicine? This medicine is for injection into a vein. It is given by a health care professional in a hospital or clinic setting. Talk to your pediatrician regarding the use of this medicine in children. Special care may be needed. Overdosage: If you think you've taken too much of this medicine contact a poison control center or emergency room at once. Overdosage: If you think you have taken too much of this medicine contact a poison control center or emergency room at once. NOTE: This medicine is only for you. Do not share this medicine with others. What if I miss a dose? It is important not to miss your dose. Call your doctor or health care professional if you are unable to keep an appointment. What may interact with this medicine? This medicine may interact with the following medications: -other iron products This list may not describe all possible interactions. Give your health care provider a list of all the medicines, herbs, non-prescription drugs, or dietary supplements you use. Also tell them if you smoke, drink alcohol, or use illegal drugs. Some items may interact with your medicine. What should I watch for while using this medicine? Visit your doctor or healthcare professional regularly. Tell your doctor or  healthcare professional if your symptoms do not start to get better or if they get worse. You may need blood work done while you are taking this medicine. You may need to follow a special diet. Talk to your doctor. Foods that contain iron include: whole grains/cereals, dried fruits, beans, or peas, leafy green vegetables, and organ meats (liver, kidney). What side effects may I notice from receiving this medicine? Side effects that you should report to your doctor or health care professional as soon as possible: -allergic reactions like skin rash, itching or hives, swelling of the face, lips, or tongue -breathing problems -changes in blood pressure -feeling faint or lightheaded, falls -fever or chills -flushing, sweating, or hot feelings -swelling of the ankles or feet Side effects that usually do not require medical attention (Report these to your doctor or health care professional if they continue or are bothersome.): -diarrhea -headache -nausea, vomiting -stomach pain This list may not describe all possible side effects. Call your doctor for medical advice about side effects. You may report side effects to FDA at 1-800-FDA-1088. Where should I keep my medicine? This drug is given in a hospital or clinic and will not be stored at home. NOTE: This sheet is a summary. It may not cover all possible information. If you have questions about this medicine, talk to your doctor, pharmacist, or health care provider.  2015, Elsevier/Gold Standard. (2011-10-17 15:23:36) Darbepoetin Alfa injection What is this medicine? DARBEPOETIN ALFA (dar be POE e tin AL fa) helps your  body make more red blood cells. It is used to treat anemia caused by chronic kidney failure and chemotherapy. This medicine may be used for other purposes; ask your health care provider or pharmacist if you have questions. COMMON BRAND NAME(S): Aranesp What should I tell my health care provider before I take this medicine? They  need to know if you have any of these conditions: -blood clotting disorders or history of blood clots -cancer patient not on chemotherapy -cystic fibrosis -heart disease, such as angina, heart failure, or a history of a heart attack -hemoglobin level of 12 g/dL or greater -high blood pressure -low levels of folate, iron, or vitamin B12 -seizures -an unusual or allergic reaction to darbepoetin, erythropoietin, albumin, hamster proteins, latex, other medicines, foods, dyes, or preservatives -pregnant or trying to get pregnant -breast-feeding How should I use this medicine? This medicine is for injection into a vein or under the skin. It is usually given by a health care professional in a hospital or clinic setting. If you get this medicine at home, you will be taught how to prepare and give this medicine. Do not shake the solution before you withdraw a dose. Use exactly as directed. Take your medicine at regular intervals. Do not take your medicine more often than directed. It is important that you put your used needles and syringes in a special sharps container. Do not put them in a trash can. If you do not have a sharps container, call your pharmacist or healthcare provider to get one. Talk to your pediatrician regarding the use of this medicine in children. While this medicine may be used in children as young as 1 year for selected conditions, precautions do apply. Overdosage: If you think you have taken too much of this medicine contact a poison control center or emergency room at once. NOTE: This medicine is only for you. Do not share this medicine with others. What if I miss a dose? If you miss a dose, take it as soon as you can. If it is almost time for your next dose, take only that dose. Do not take double or extra doses. What may interact with this medicine? Do not take this medicine with any of the following medications: -epoetin alfa This list may not describe all possible  interactions. Give your health care provider a list of all the medicines, herbs, non-prescription drugs, or dietary supplements you use. Also tell them if you smoke, drink alcohol, or use illegal drugs. Some items may interact with your medicine. What should I watch for while using this medicine? Visit your prescriber or health care professional for regular checks on your progress and for the needed blood tests and blood pressure measurements. It is especially important for the doctor to make sure your hemoglobin level is in the desired range, to limit the risk of potential side effects and to give you the best benefit. Keep all appointments for any recommended tests. Check your blood pressure as directed. Ask your doctor what your blood pressure should be and when you should contact him or her. As your body makes more red blood cells, you may need to take iron, folic acid, or vitamin B supplements. Ask your doctor or health care provider which products are right for you. If you have kidney disease continue dietary restrictions, even though this medication can make you feel better. Talk with your doctor or health care professional about the foods you eat and the vitamins that you take. What side effects may  I notice from receiving this medicine? Side effects that you should report to your doctor or health care professional as soon as possible: -allergic reactions like skin rash, itching or hives, swelling of the face, lips, or tongue -breathing problems -changes in vision -chest pain -confusion, trouble speaking or understanding -feeling faint or lightheaded, falls -high blood pressure -muscle aches or pains -pain, swelling, warmth in the leg -rapid weight gain -severe headaches -sudden numbness or weakness of the face, arm or leg -trouble walking, dizziness, loss of balance or coordination -seizures (convulsions) -swelling of the ankles, feet, hands -unusually weak or tired Side effects that  usually do not require medical attention (report to your doctor or health care professional if they continue or are bothersome): -diarrhea -fever, chills (flu-like symptoms) -headaches -nausea, vomiting -redness, stinging, or swelling at site where injected This list may not describe all possible side effects. Call your doctor for medical advice about side effects. You may report side effects to FDA at 1-800-FDA-1088. Where should I keep my medicine? Keep out of the reach of children. Store in a refrigerator between 2 and 8 degrees C (36 and 46 degrees F). Do not freeze. Do not shake. Throw away any unused portion if using a single-dose vial. Throw away any unused medicine after the expiration date. NOTE: This sheet is a summary. It may not cover all possible information. If you have questions about this medicine, talk to your doctor, pharmacist, or health care provider.  2015, Elsevier/Gold Standard. (2008-02-15 10:23:57)

## 2013-12-29 NOTE — Progress Notes (Signed)
[facility]  Telephone:(336) [phone] [phone]) [phone]  ID: [name] OB: 12-21-1945 MR#: [id number]#:[id number] Patient Care Team: [name], NP as PCP - General (Internal Medicine)  DIAGNOSIS: 1. Recurrent iron-deficiency anemia secondary to gastrointestinal  bleeding.  2. 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  3. History of stage I prostate cancer.  INTERVAL HISTORY: [name] is here today for a follow-up. He is feeling tired and dizzy. His Hgb today is 8.6 MCV 87. He last had iron in August. He is trying to control his diabetes better. He is 89 today.  He still has issues with his arthritis. He did well with physical therapy.   He's had no problems his prostate cancer surgery. He is followed by urology. He does still have some tingling in his hands and feet at times. He denies fever chills, n/v, cough, rash, headache, SOB, chest pain, palpitations, abdominal pain, constipation, diarrhea, blood in urine or stool. No swelling, tenderness, numbness or tingling in his extremities. No pain or bleeding. His appetite is good and he is staying hydrated.   CURRENT TREATMENT: 1. Aranesp 300 mcg subcu as needed for hemoglobin less than 10.  2. IV iron as indicated.  REVIEW OF SYSTEMS: All other 10 point review of systems is negative.   PAST MEDICAL HISTORY: Past Medical History  Diagnosis Date  . Hypertension   . High cholesterol   . GERD (gastroesophageal reflux disease)   . GI bleed     a. Recurrent GI bleed, tx with IV iron. b. Per heme notes - likely AVMs 04/2012 (tx with cauterization several months ago).  . Orthostatic hypotension     a. Tx with florinef.  . Hematuria     a. Urology note scan from 07/2012: cystoscopy without evidence for bladder lesion, only lateral hypertrophy of posterior urethra, bladder impression from BPH. b. Pt states he had "some tests" scheduled for later in July 2014.  . Stage III chronic kidney  disease     a. Stage 3 (DM with complications ->CKD, peripheral neuropathy).  . Anemia, iron deficiency 03/24/2011    a. Recurrent GI bleed, tx with periodic iron infusions.  . 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 Sleep apnea     "had mask; couldn't sleep in it" (09/20/2012)  . Type II diabetes mellitus     a. Dx 1994, uncontrolled.   . Diabetic peripheral neuropathy 2014    foot pain.  . Chronic lower back pain   . Fatty liver     on CT of 11/2010  . AVM (arteriovenous malformation) of duodenum, acquired     egds in 01/2012, 04/2011  . Polyp, colonic     Colonoscopy 01/2012 "benign" polyp  . CAD (coronary artery disease)     a. Cath 09/2012: moderate borderline CAD in mid LAD/small diagonal branch, mild RCA stenosis, to be managed medically   . BPH (benign prostatic hyperplasia)   . Prostate cancer   . Stroke   . Peripheral neuropathy   . 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 Shoulder open rotator cuff repair Left 1980's  . Inguinal hernia repair Right 2011  . Lumbar disc surgery  1980's  . Robot assisted laparoscopic radical prostatectomy N/A 01/19/2013    Procedure: ROBOTIC ASSISTED LAPAROSCOPIC RADICAL PROSTATECTOMY;  Surgeon: Bernestine Amass, MD;  Location: [facility];  Service: Urology;  Laterality: N/A;  . Lymphadenectomy Bilateral  01/19/2013    Procedure: LYMPHADENECTOMY;  Surgeon: Bernestine Amass, MD;  Location: [facility];  Service: Urology;  Laterality: Bilateral;  . Enteroscopy N/A 03/01/2013    Procedure: ENTEROSCOPY;  Surgeon: Inda Castle, MD;  Location: [facility];  Service: Endoscopy;  Laterality: N/A;    FAMILY HISTORY Family History  Problem Relation Age of Onset  . Heart attack Brother     Died at 53  . Stroke Father     Died at 55  . Diabetes Sister   . Diabetes Mother   . Stomach cancer Brother   . Heart attack Sister     GYNECOLOGIC HISTORY:  No LMP for male  patient.   SOCIAL HISTORY: History   Social History  . Marital Status: Married    Spouse Name: N/A    Number of Children: 2  . Years of Education: N/A   Occupational History  . taken out of work due to back    Social History Main Topics  . Smoking status: Current Some Day Smoker -- 1.00 packs/day for 43 years    Types: Cigarettes    Start date: 04/15/1968  . Smokeless tobacco: Never Used     Comment: quit 2 years ago  . Alcohol Use: No     Comment: 09/20/2012 "Used to; stopped ~ 2009; never had problem w/it"  . Drug Use: No  . Sexual Activity: No   Other Topics Concern  . Not on file   Social History Narrative   Regular exercise: rides horses   Caffeine use: occasionally    ADVANCED DIRECTIVES:  <no information>  HEALTH MAINTENANCE: History  Substance Use Topics  . Smoking status: Current Some Day Smoker -- 1.00 packs/day for 43 years    Types: Cigarettes    Start date: 04/15/1968  . Smokeless tobacco: Never Used     Comment: quit 2 years ago  . Alcohol Use: No     Comment: 09/20/2012 "Used to; stopped ~ 2009; never had problem w/it"   Colonoscopy: PAP: Bone density: Lipid panel:  No Known Allergies  Current Outpatient Prescriptions  Medication Sig Dispense Refill  . aspirin EC 81 MG tablet Take 81 mg by mouth daily.      [name] Kitchen atorvastatin (LIPITOR) 80 MG tablet TAKE 1 TABLET BY MOUTH EVERY DAY  30 tablet  2  . gabapentin (NEURONTIN) 300 MG capsule TAKE 1 CAPSULE (300 MG TOTAL) BY MOUTH AT BEDTIME.  30 capsule  3  . HUMALOG 100 UNIT/ML injection USE AS ADVISED - MAXIMUM 25 UNITS PER DAY. INJECT 7-7-5 UNITS PLUS SSI AS ADVISED.  10 mL  0  . hydrALAZINE (APRESOLINE) 10 MG tablet TAKE 1 TABLET BY MOUTH EVERY 8 HOURS  90 tablet  2  . Insulin Glargine (LANTUS) 100 UNIT/ML Solostar Pen Inject 10 Units into the skin every morning.       . Insulin Pen Needle (B-D ULTRAFINE III SHORT PEN) 31G X 8 MM MISC USE AS DIRECTED WITH INSULIN Dx: 250.02  100 each  5  . lisinopril  (PRINIVIL,ZESTRIL) 10 MG tablet Take 1 tablet (10 mg total) by mouth daily.  30 tablet  0  . nitroGLYCERIN (NITROSTAT) 0.4 MG SL tablet Place 1 tablet (0.4 mg total) under the tongue every 5 (five) minutes as needed for chest pain (up to 3 doses).  25 tablet  2  . pantoprazole (PROTONIX) 40 MG tablet Take 1 tablet (40 mg total) by mouth daily.  30 tablet  3  . polyvinyl alcohol (  LIQUIFILM TEARS) 1.4 % ophthalmic solution Place 1 drop into both eyes daily as needed (dry eyes).      . potassium chloride SA (KLOR-CON M20) 20 MEQ tablet Take 2 tablets (40 mEq total) by mouth once. Take 2 pills a day  60 tablet  6  . rosuvastatin (CRESTOR) 20 MG tablet Take 1 tablet (20 mg total) by mouth daily.  30 tablet  2  . traMADol (ULTRAM) 50 MG tablet Take 1 tablet (50 mg total) by mouth every 8 (eight) hours as needed.  30 tablet  0   Current Facility-Administered Medications  Medication Dose Route Frequency Provider Last Rate Last Dose  . darbepoetin (ARANESP) injection 300 mcg  300 mcg Subcutaneous Once Eliezer Bottom, NP      . Influenza vac split quadrivalent PF (FLUARIX) injection 0.5 mL  0.5 mL Intramuscular Once Eliezer Bottom, NP       Facility-Administered Medications Ordered in Other Visits  Medication Dose Route Frequency Provider Last Rate Last Dose  . 0.9 %  sodium chloride infusion   Intravenous Once Volanda Napoleon, MD      . sodium chloride 0.9 % 100 mL with ferumoxytol (FERAHEME) 510 mg infusion   Intravenous Once Volanda Napoleon, MD        OBJECTIVE: Filed Vitals:   12/29/13 0800  BP: 122/70  Pulse: 94  Temp: 98.1 F (36.7 C)  Resp: 18    Filed Weights   12/29/13 0800  Weight: 160 lb (72.576 kg)   ECOG FS:1 - Symptomatic but completely ambulatory Ocular: Sclerae unicteric, pupils equal, round and reactive to light Ear-nose-throat: Oropharynx clear, dentition fair Lymphatic: No cervical or supraclavicular adenopathy Lungs no rales or rhonchi, good excursion  bilaterally Heart regular rate and rhythm, no murmur appreciated Abd soft, nontender, positive bowel sounds MSK no focal spinal tenderness, no joint edema Neuro: non-focal, well-oriented, appropriate affect  LAB RESULTS: CMP     Component Value Date/Time   NA 141 10/27/2013 0756   NA 136 03/08/2013 1135   K 4.5 10/27/2013 0756   K 3.6 03/08/2013 1135   CL 97* 10/27/2013 0756   CL 102 03/08/2013 1135   CO2 29 10/27/2013 0756   CO2 27 03/08/2013 1135   GLUCOSE 235* 10/27/2013 0756   GLUCOSE 350* 03/08/2013 1135   BUN 18 10/27/2013 0756   BUN 10 03/08/2013 1135   CREATININE 1.6* 10/27/2013 0756   CREATININE 1.14 03/08/2013 1135   CALCIUM 8.7 10/27/2013 0756   CALCIUM 8.6 03/08/2013 1135   PROT 6.9 10/27/2013 0756   PROT 5.4* 05/24/2013 0915   ALBUMIN 2.7* 05/24/2013 0915   AST 20 10/27/2013 0756   AST 11 05/24/2013 0915   ALT 13 10/27/2013 0756   ALT 9 05/24/2013 0915   ALKPHOS 108* 10/27/2013 0756   ALKPHOS 102 05/24/2013 0915   BILITOT 0.50 10/27/2013 0756   BILITOT 0.5 05/24/2013 0915   GFRNONAA 65* 03/08/2013 1135   GFRAA 76* 03/08/2013 1135   No results found for this basename: SPEP, UPEP,  kappa and lambda light chains   Lab Results  Component Value Date   WBC 4.8 12/29/2013   NEUTROABS 3.0 12/29/2013   HGB 8.6* 12/29/2013   HCT 26.0* 12/29/2013   MCV 87 12/29/2013   PLT 352 12/29/2013   No results found for this basename: LABCA2   No components found with this basename: NIOEV035   No results found for this basename: INR,  in the last 168 hours Urinalysis  Component Value Date/Time   COLORURINE YELLOW 03/02/2013 0225   APPEARANCEUR CLEAR 03/02/2013 0225   LABSPEC 1.021 03/02/2013 0225   PHURINE 6.0 03/02/2013 0225   GLUCOSEU 250* 03/02/2013 0225   HGBUR TRACE* 03/02/2013 0225   BILIRUBINUR NEGATIVE 03/02/2013 0225   BILIRUBINUR small 06/21/2012 1041   KETONESUR 15* 03/02/2013 0225   PROTEINUR >300* 03/02/2013 0225   PROTEINUR 2000+ 06/21/2012 1041   UROBILINOGEN  1.0 03/02/2013 0225   UROBILINOGEN 0.2 06/21/2012 1041   NITRITE NEGATIVE 03/02/2013 0225   NITRITE negative 06/21/2012 1041   LEUKOCYTESUR NEGATIVE 03/02/2013 0225   STUDIES: No results found.  ASSESSMENT/PLAN: [name] is 68 year old gentleman with iron deficiency anemia. He is feeling tired and dizzy. His Hgb today is 8.6 and MCV 87.  We will go ahead and give him Fereheme 1020 today.  We will also give him Aranesp 300 today. .  We will see him back in 3 months for labs and follow-up.  He knows to call here with any questions or concerns and to go to the ED in the event of an emergency.   Eliezer Bottom, NP 12/29/2013 9:06 AM

## 2014-01-28 ENCOUNTER — Other Ambulatory Visit: Payer: Self-pay | Admitting: Family

## 2014-01-30 NOTE — Telephone Encounter (Signed)
Lisinopril refill sent to pharmacy. Pt was due for follow up on 01/28/14.  Please call pt to arrange f/u.

## 2014-01-30 NOTE — Telephone Encounter (Signed)
Informed patient of refill and he scheduled appointment for 11/20

## 2014-02-03 ENCOUNTER — Ambulatory Visit (INDEPENDENT_AMBULATORY_CARE_PROVIDER_SITE_OTHER): Payer: Medicare Other | Admitting: Family

## 2014-02-03 ENCOUNTER — Encounter: Payer: Self-pay | Admitting: Family

## 2014-02-03 ENCOUNTER — Telehealth: Payer: Self-pay | Admitting: Family

## 2014-02-03 VITALS — BP 130/79 | HR 93 | Temp 97.6°F | Resp 16 | Ht 67.0 in | Wt 156.6 lb

## 2014-02-03 DIAGNOSIS — J020 Streptococcal pharyngitis: Secondary | ICD-10-CM

## 2014-02-03 DIAGNOSIS — B353 Tinea pedis: Secondary | ICD-10-CM

## 2014-02-03 DIAGNOSIS — E785 Hyperlipidemia, unspecified: Secondary | ICD-10-CM

## 2014-02-03 DIAGNOSIS — IMO0002 Reserved for concepts with insufficient information to code with codable children: Secondary | ICD-10-CM

## 2014-02-03 DIAGNOSIS — R739 Hyperglycemia, unspecified: Secondary | ICD-10-CM

## 2014-02-03 DIAGNOSIS — E1140 Type 2 diabetes mellitus with diabetic neuropathy, unspecified: Secondary | ICD-10-CM

## 2014-02-03 DIAGNOSIS — J029 Acute pharyngitis, unspecified: Secondary | ICD-10-CM

## 2014-02-03 DIAGNOSIS — I10 Essential (primary) hypertension: Secondary | ICD-10-CM

## 2014-02-03 DIAGNOSIS — E1165 Type 2 diabetes mellitus with hyperglycemia: Secondary | ICD-10-CM

## 2014-02-03 DIAGNOSIS — Z23 Encounter for immunization: Secondary | ICD-10-CM

## 2014-02-03 LAB — HEMOGLOBIN A1C: Hgb A1c MFr Bld: 9.5 % — ABNORMAL HIGH (ref 4.6–6.5)

## 2014-02-03 LAB — BASIC METABOLIC PANEL
BUN: 19 mg/dL (ref 6–23)
CO2: 27 meq/L (ref 19–32)
Calcium: 8.8 mg/dL (ref 8.4–10.5)
Chloride: 107 mEq/L (ref 96–112)
Creatinine, Ser: 1.5 mg/dL (ref 0.4–1.5)
GFR: 59.99 mL/min — ABNORMAL LOW (ref >60.00)
Glucose, Bld: 188 mg/dL — ABNORMAL HIGH (ref 70–99)
POTASSIUM: 4.1 meq/L (ref 3.5–5.1)
SODIUM: 139 meq/L (ref 135–145)

## 2014-02-03 LAB — LIPID PANEL
CHOLESTEROL: 185 mg/dL (ref 0–200)
HDL: 29.2 mg/dL — ABNORMAL LOW (ref >39.00)
LDL Cholesterol: 124 mg/dL — ABNORMAL HIGH (ref 0–99)
NonHDL: 155.8
Total CHOL/HDL Ratio: 6
Triglycerides: 160 mg/dL — ABNORMAL HIGH (ref 0.0–149.0)
VLDL: 32 mg/dL (ref 0.0–40.0)

## 2014-02-03 LAB — POCT RAPID STREP A: Rapid Strep A Screen: NEGATIVE

## 2014-02-03 NOTE — Assessment & Plan Note (Signed)
Advised lotrimin otc.  If symptoms do not improve, may consider oral antifungal.

## 2014-02-03 NOTE — Assessment & Plan Note (Signed)
Stable on current meds.  Continue same, obtain follow up bmet.  

## 2014-02-03 NOTE — Telephone Encounter (Signed)
Please contact pt and have him double check that he was taking crestor 20mg  and not lipitor with his home meds.  If he was taking crestor, I would like to increase to 40mg  once daily.  If he was not taking crestor, then I would recommend that we start him on crestor 20mg .  He will need repeat lipids in 6 weeks dx hyperlipidemia. Sugar remains uncontrolled. He should keep upcoming apts with endocrinology.

## 2014-02-03 NOTE — Assessment & Plan Note (Signed)
Obtain follow up lipid pane. Continue statin.

## 2014-02-03 NOTE — Assessment & Plan Note (Signed)
Rapid strep negative. Advised supportive measures as outlined in AVS.

## 2014-02-03 NOTE — Assessment & Plan Note (Signed)
He is not taking meal coverage due to not being able to afford to check his sugars.  I did give him a hand written rx for strips.  Advised that he discuss a standing meal coverage dosing with his endo. Obtain a1c, urine micro

## 2014-02-03 NOTE — Progress Notes (Signed)
Subjective:    Patient ID: [name], male    DOB: Mar 28, 1945, 69 y.o.   MRN: [id number]  HPI  [name] is a 67 yr old male who presents today for follow up.  HTN-  Current bp meds include lisinopril, hydralazine.  He denies chest pain or shortness of breath.  BP Readings from Last 3 Encounters:  02/03/14 130/79  12/29/13 122/70  10/28/13 136/80   Hyperlipidemia-  Current statin is crestor.  Denies myalgia.  Lab Results  Component Value Date   CHOL 236* 09/07/2013   HDL 35* 09/07/2013   LDLCALC 167* 09/07/2013   TRIG 172* 09/07/2013   CHOLHDL 6.7 09/07/2013   DM2-  Current meds include humalog, lantus.  Reports that he has not been checking sugar due to cost of strips.  Patient saw endo last month.  She changed his lantus dose to AM instead of PM. He uses lantus 12 units. Reports that he is not doing sliding scale due to He uses humalog AC meals Lab Results  Component Value Date   HGBA1C 10.6* 09/07/2013   HGBA1C 10.9* 05/24/2013   HGBA1C 9.9* 02/21/2013   Lab Results  Component Value Date   MICROALBUR 529.37* 09/07/2013   LDLCALC 167* 09/07/2013   CREATININE 1.5* 12/29/2013   Sore throat- x 48 hours. Bad last night, mild today. Denies associated headache, nasal congestion. Does report mild cough.   Lumbar disc disease- he is established with Dr. Hal Neer, neurosurgeon. Reports continued low back pain and that the right leg "gives out on me."   Review of Systems See HPI  Past Medical History  Diagnosis Date  . Hypertension   . High cholesterol   . GERD (gastroesophageal reflux disease)   . GI bleed     a. Recurrent GI bleed, tx with IV iron. b. Per heme notes - likely AVMs 04/2012 (tx with cauterization several months ago).  . Orthostatic hypotension     a. Tx with florinef.  . Hematuria     a. Urology note scan from 07/2012: cystoscopy without evidence for bladder lesion, only lateral hypertrophy of posterior urethra, bladder impression from BPH. b. Pt states  he had "some tests" scheduled for later in July 2014.  . Stage III chronic kidney disease     a. Stage 3 (DM with complications ->CKD, peripheral neuropathy).  . Anemia, iron deficiency 03/24/2011    a. Recurrent GI bleed, tx with periodic iron infusions.  . 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 Sleep apnea     "had mask; couldn't sleep in it" (09/20/2012)  . Type II diabetes mellitus     a. Dx 1994, uncontrolled.   . Diabetic peripheral neuropathy 2014    foot pain.  . Chronic lower back pain   . Fatty liver     on CT of 11/2010  . AVM (arteriovenous malformation) of duodenum, acquired     egds in 01/2012, 04/2011  . Polyp, colonic     Colonoscopy 01/2012 "benign" polyp  . CAD (coronary artery disease)     a. Cath 09/2012: moderate borderline CAD in mid LAD/small diagonal branch, mild RCA stenosis, to be managed medically   . BPH (benign prostatic hyperplasia)   . Prostate cancer   . Stroke   . Peripheral neuropathy   . 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 Marital Status: Married    Spouse Name: N/A    Number of Children: 2  .  Years of Education: N/A   Occupational History  . taken out of work due to back    Social History Main Topics  . Smoking status: Current Some Day Smoker -- 1.00 packs/day for 43 years    Types: Cigarettes    Start date: 04/15/1968  . Smokeless tobacco: Never Used     Comment: quit 2 years ago  . Alcohol Use: No     Comment: 09/20/2012 "Used to; stopped ~ 2009; never had problem w/it"  . Drug Use: No  . Sexual Activity: No   Other Topics Concern  . Not on file   Social History Narrative   Regular exercise: rides horses   Caffeine use: occasionally    Past Surgical History  Procedure Laterality Date  . Shoulder open rotator cuff repair Left 1980's  . Inguinal hernia repair Right 2011  . Lumbar disc surgery  1980's  . Robot assisted laparoscopic radical prostatectomy N/A 01/19/2013    Procedure:  ROBOTIC ASSISTED LAPAROSCOPIC RADICAL PROSTATECTOMY;  Surgeon: Bernestine Amass, MD;  Location: [facility];  Service: Urology;  Laterality: N/A;  . Lymphadenectomy Bilateral 01/19/2013    Procedure: LYMPHADENECTOMY;  Surgeon: Bernestine Amass, MD;  Location: [facility];  Service: Urology;  Laterality: Bilateral;  . Enteroscopy N/A 03/01/2013    Procedure: ENTEROSCOPY;  Surgeon: Inda Castle, MD;  Location: [facility];  Service: Endoscopy;  Laterality: N/A;    Family History  Problem Relation Age of Onset  . Heart attack Brother     Died at 61  . Stroke Father     Died at 64  . Diabetes Sister   . Diabetes Mother   . Stomach cancer Brother   . Heart attack Sister     No Known Allergies  Current Outpatient Prescriptions on File Prior to Visit  Medication Sig Dispense Refill  . aspirin EC 81 MG tablet Take 81 mg by mouth daily.    [name] Kitchen gabapentin (NEURONTIN) 300 MG capsule TAKE 1 CAPSULE (300 MG TOTAL) BY MOUTH AT BEDTIME. 30 capsule 3  . HUMALOG 100 UNIT/ML injection USE AS ADVISED - MAXIMUM 25 UNITS PER DAY. INJECT 7-7-5 UNITS PLUS SSI AS ADVISED. 10 mL 0  . hydrALAZINE (APRESOLINE) 10 MG tablet TAKE 1 TABLET BY MOUTH EVERY 8 HOURS 90 tablet 2  . Insulin Glargine (LANTUS) 100 UNIT/ML Solostar Pen Inject 10 Units into the skin every morning.     . Insulin Pen Needle (B-D ULTRAFINE III SHORT PEN) 31G X 8 MM MISC USE AS DIRECTED WITH INSULIN Dx: 250.02 100 each 5  . lisinopril (PRINIVIL,ZESTRIL) 10 MG tablet TAKE 1 TABLET (10 MG TOTAL) BY MOUTH DAILY. 30 tablet 1  . nitroGLYCERIN (NITROSTAT) 0.4 MG SL tablet Place 1 tablet (0.4 mg total) under the tongue every 5 (five) minutes as needed for chest pain (up to 3 doses). 25 tablet 2  . pantoprazole (PROTONIX) 40 MG tablet Take 1 tablet (40 mg total) by mouth daily. 30 tablet 3  . polyvinyl alcohol (LIQUIFILM TEARS) 1.4 % ophthalmic solution Place 1 drop into both eyes daily as needed (dry eyes).    . potassium chloride SA (KLOR-CON M20) 20 MEQ tablet  Take 2 tablets (40 mEq total) by mouth once. Take 2 pills a day 60 tablet 6  . rosuvastatin (CRESTOR) 20 MG tablet Take 1 tablet (20 mg total) by mouth daily. 30 tablet 2  . traMADol (ULTRAM) 50 MG tablet Take 1 tablet (50 mg total) by mouth every 8 (eight) hours as  needed. 30 tablet 0   No current facility-administered medications on file prior to visit.    BP 130/79 mmHg  Pulse 93  Temp(Src) 97.6 F (36.4 C) (Oral)  Resp 16  Ht 5\' 7"  (1.702 m)  Wt 156 lb 9.6 oz (71.033 kg)  BMI 24.52 kg/m2  SpO2 100%       Objective:   Physical Exam  Constitutional: He is oriented to person, place, and time. He appears well-developed and well-nourished. No distress.  HENT:  Head: Normocephalic and atraumatic.  Cardiovascular: Normal rate and regular rhythm.   No murmur heard. Pulmonary/Chest: Effort normal and breath sounds normal. No respiratory distress. He has no wheezes. He has no rales. He exhibits no tenderness.  Musculoskeletal: He exhibits no edema.       Thoracic back: He exhibits no tenderness.       Lumbar back: He exhibits no tenderness.  Neurological: He is alert and oriented to person, place, and time.  Reflex Scores:      Patellar reflexes are 1+ on the right side and 1+ on the left side. Bilateral LE strength is 5/5  Psychiatric: He has a normal mood and affect. His behavior is normal. Judgment and thought content normal.          Assessment & Plan:

## 2014-02-03 NOTE — Progress Notes (Signed)
Pre visit review using our clinic review tool, if applicable. No additional management support is needed unless otherwise documented below in the visit note. 

## 2014-02-03 NOTE — Patient Instructions (Addendum)
Complete lab work prior to leaving.  Please arrange follow up with Dr. Hal Neer for your back pain/right leg weakness. Apply lotrimin cream to both feet once daily for athlete's foot. Contact your endocrinologist for further recommendations re:  Insulin coverage before meals since you have not been able to test for sliding scale.   For your sore throat, you may do salt water gargles and use tylenol as needed for pain. Call if symptoms do  Not continue to improve.

## 2014-02-06 ENCOUNTER — Encounter: Payer: Self-pay | Admitting: Family

## 2014-02-06 ENCOUNTER — Telehealth: Payer: Self-pay | Admitting: *Deleted

## 2014-02-06 LAB — MICROALBUMIN / CREATININE URINE RATIO
CREATININE, U: 371.3 mg/dL
MICROALB/CREAT RATIO: 293.5 mg/g — AB (ref 0.0–30.0)
Microalb, Ur: 1089.9 mg/dL — ABNORMAL HIGH (ref 0.0–1.9)

## 2014-02-06 NOTE — Telephone Encounter (Signed)
Left message on cell# to return my call. 

## 2014-02-06 NOTE — Telephone Encounter (Signed)
Received order via fax from [facility] for knee orthosis and suspension sleeve. Order forwarded to [facility]. JG//CMA

## 2014-02-07 ENCOUNTER — Telehealth: Payer: Self-pay | Admitting: Family

## 2014-02-07 NOTE — Telephone Encounter (Signed)
emmi mailed  °

## 2014-02-07 NOTE — Telephone Encounter (Signed)
When pt calls back, please also ask him if he requested knee brace.  I have form on my desk. Thanks.

## 2014-02-07 NOTE — Telephone Encounter (Signed)
Spoke with pt and confirmed that he is wanting order for knee brace completed. Scheduled pt appt for 02/13/14 at 2:30pm to discuss. Pt is not currently at home and will call back once he can check his medications to verify Crestor dose.

## 2014-02-08 ENCOUNTER — Ambulatory Visit (INDEPENDENT_AMBULATORY_CARE_PROVIDER_SITE_OTHER): Payer: Medicare Other | Admitting: Physician Assistant

## 2014-02-08 ENCOUNTER — Encounter: Payer: Self-pay | Admitting: Physician Assistant

## 2014-02-08 ENCOUNTER — Telehealth: Payer: Self-pay | Admitting: Family

## 2014-02-08 VITALS — BP 146/82 | HR 106 | Temp 98.2°F | Resp 16 | Ht 67.0 in | Wt 161.5 lb

## 2014-02-08 DIAGNOSIS — B9689 Other specified bacterial agents as the cause of diseases classified elsewhere: Secondary | ICD-10-CM

## 2014-02-08 DIAGNOSIS — J019 Acute sinusitis, unspecified: Secondary | ICD-10-CM

## 2014-02-08 MED ORDER — AMOXICILLIN 875 MG PO TABS: 875.0000 mg | ORAL_TABLET | Freq: Two times a day (BID) | ORAL | Status: DC

## 2014-02-08 MED ORDER — HYDROCOD POLST-CHLORPHEN POLST 10-8 MG/5ML PO LQCR: 5.0000 mL | Freq: Two times a day (BID) | ORAL | Status: DC | PRN

## 2014-02-08 NOTE — Assessment & Plan Note (Signed)
Rx Amoxicillin. IRx tussionex for cough.  Increase fluids. Rest. Saline nasal spray.  Plain Mucinex.  Humidifier in bedroom.

## 2014-02-08 NOTE — Telephone Encounter (Signed)
Caller name: Mazurowski,Loretta Relation to pt: spouse Call back number: [phone] Pharmacy: [facility] [phone]  Reason for call:  Spouse requesting rx for pt. Pt exp coughing and congestion and pt has diabetes therefore any OTC will not work in need of clinical advice.

## 2014-02-08 NOTE — Telephone Encounter (Signed)
Spoke with pt's wife and advised her that pt would need to be seen for evaluation before meds can be prescribed. Scheduled pt appt today with PA, Hassell Done at 5:30pm.

## 2014-02-08 NOTE — Patient Instructions (Signed)
Please take antibiotic as directed.  Increase fluid intake.  Use Saline nasal spray.  Take a daily multivitamin. Use tussionex as directed for cough.  Place a humidifier in the bedroom.  Please call or return clinic if symptoms are not improving.  Sinusitis Sinusitis is redness, soreness, and swelling (inflammation) of the paranasal sinuses. Paranasal sinuses are air pockets within the bones of your face (beneath the eyes, the middle of the forehead, or above the eyes). In healthy paranasal sinuses, mucus is able to drain out, and air is able to circulate through them by way of your nose. However, when your paranasal sinuses are inflamed, mucus and air can become trapped. This can allow bacteria and other germs to grow and cause infection. Sinusitis can develop quickly and last only a short time (acute) or continue over a long period (chronic). Sinusitis that lasts for more than 12 weeks is considered chronic.  CAUSES  Causes of sinusitis include:  Allergies.  Structural abnormalities, such as displacement of the cartilage that separates your nostrils (deviated septum), which can decrease the air flow through your nose and sinuses and affect sinus drainage.  Functional abnormalities, such as when the small hairs (cilia) that line your sinuses and help remove mucus do not work properly or are not present. SYMPTOMS  Symptoms of acute and chronic sinusitis are the same. The primary symptoms are pain and pressure around the affected sinuses. Other symptoms include:  Upper toothache.  Earache.  Headache.  Bad breath.  Decreased sense of smell and taste.  A cough, which worsens when you are lying flat.  Fatigue.  Fever.  Thick drainage from your nose, which often is green and may contain pus (purulent).  Swelling and warmth over the affected sinuses. DIAGNOSIS  Your caregiver will perform a physical exam. During the exam, your caregiver may:  Look in your nose for signs of abnormal  growths in your nostrils (nasal polyps).  Tap over the affected sinus to check for signs of infection.  View the inside of your sinuses (endoscopy) with a special imaging device with a light attached (endoscope), which is inserted into your sinuses. If your caregiver suspects that you have chronic sinusitis, one or more of the following tests may be recommended:  Allergy tests.  Nasal culture A sample of mucus is taken from your nose and sent to a lab and screened for bacteria.  Nasal cytology A sample of mucus is taken from your nose and examined by your caregiver to determine if your sinusitis is related to an allergy. TREATMENT  Most cases of acute sinusitis are related to a viral infection and will resolve on their own within 10 days. Sometimes medicines are prescribed to help relieve symptoms (pain medicine, decongestants, nasal steroid sprays, or saline sprays).  However, for sinusitis related to a bacterial infection, your caregiver will prescribe antibiotic medicines. These are medicines that will help kill the bacteria causing the infection.  Rarely, sinusitis is caused by a fungal infection. In theses cases, your caregiver will prescribe antifungal medicine. For some cases of chronic sinusitis, surgery is needed. Generally, these are cases in which sinusitis recurs more than 3 times per year, despite other treatments. HOME CARE INSTRUCTIONS   Drink plenty of water. Water helps thin the mucus so your sinuses can drain more easily.  Use a humidifier.  Inhale steam 3 to 4 times a day (for example, sit in the bathroom with the shower running).  Apply a warm, moist washcloth to your face  3 to 4 times a day, or as directed by your caregiver.  Use saline nasal sprays to help moisten and clean your sinuses.  Take over-the-counter or prescription medicines for pain, discomfort, or fever only as directed by your caregiver. SEEK IMMEDIATE MEDICAL CARE IF:  You have increasing pain or  severe headaches.  You have nausea, vomiting, or drowsiness.  You have swelling around your face.  You have vision problems.  You have a stiff neck.  You have difficulty breathing. MAKE SURE YOU:   Understand these instructions.  Will watch your condition.  Will get help right away if you are not doing well or get worse. Document Released: 03/03/2005 Document Revised: 05/26/2011 Document Reviewed: 03/18/2011 [employer] Patient Information 2014 [employer].   

## 2014-02-08 NOTE — Progress Notes (Signed)
History of Present Illness: [name] is a 68 y.o. male who present to the clinic today complaining of sinus pressure, sinus pain and nasal congestion associated with sore throat and PND. Patient endorses ear pain.  Patient denies fever, chills or tooth pain.  Endorses persistent but dry cough.  Denies recent travel.   History: Past Medical History  Diagnosis Date  . Hypertension   . High cholesterol   . GERD (gastroesophageal reflux disease)   . GI bleed     a. Recurrent GI bleed, tx with IV iron. b. Per heme notes - likely AVMs 04/2012 (tx with cauterization several months ago).  . Orthostatic hypotension     a. Tx with florinef.  . Hematuria     a. Urology note scan from 07/2012: cystoscopy without evidence for bladder lesion, only lateral hypertrophy of posterior urethra, bladder impression from BPH. b. Pt states he had "some tests" scheduled for later in July 2014.  . Stage III chronic kidney disease     a. Stage 3 (DM with complications ->CKD, peripheral neuropathy).  . Anemia, iron deficiency 03/24/2011    a. Recurrent GI bleed, tx with periodic iron infusions.  . 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 Sleep apnea     "had mask; couldn't sleep in it" (09/20/2012)  . Type II diabetes mellitus     a. Dx 1994, uncontrolled.   . Diabetic peripheral neuropathy 2014    foot pain.  . Chronic lower back pain   . Fatty liver     on CT of 11/2010  . AVM (arteriovenous malformation) of duodenum, acquired     egds in 01/2012, 04/2011  . Polyp, colonic     Colonoscopy 01/2012 "benign" polyp  . CAD (coronary artery disease)     a. Cath 09/2012: moderate borderline CAD in mid LAD/small diagonal branch, mild RCA stenosis, to be managed medically   . BPH (benign prostatic hyperplasia)   . Prostate cancer   . Stroke   . Peripheral neuropathy   . 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 INJECT 7-7-5 UNITS PLUS SSI AS ADVISED., Disp: 10 mL, Rfl: 0 hydrALAZINE (APRESOLINE) 10 MG tablet, TAKE 1 TABLET BY MOUTH EVERY 8 HOURS, Disp: 90 tablet, Rfl: 2;  Insulin Glargine (LANTUS) 100 UNIT/ML Solostar Pen, Inject 10 Units into the skin every morning. , Disp: , Rfl: ;  Insulin Pen Needle (B-D ULTRAFINE III SHORT PEN) 31G X 8 MM MISC, USE AS DIRECTED WITH INSULIN Dx: 250.02, Disp: 100 each, Rfl: 5 lisinopril (PRINIVIL,ZESTRIL) 10 MG tablet, TAKE 1 TABLET (10 MG TOTAL) BY MOUTH DAILY., Disp: 30 tablet, Rfl: 1;  nitroGLYCERIN (NITROSTAT) 0.4 MG SL tablet, Place 1 tablet (0.4 mg total) under the tongue every 5 (five) minutes as needed for chest pain (up to 3 doses)., Disp: 25 tablet, Rfl: 2;  pantoprazole (PROTONIX) 40 MG tablet, Take 1 tablet (40 mg total) by mouth daily., Disp: 30 tablet, Rfl: 3 polyvinyl alcohol (LIQUIFILM TEARS) 1.4 % ophthalmic solution, Place 1 drop into both eyes daily as needed (dry eyes)., Disp: , Rfl: ;  potassium chloride SA (KLOR-CON M20) 20 MEQ tablet, Take 2 tablets (40 mEq total) by  mouth once. Take 2 pills a day, Disp: 60 tablet, Rfl: 6;  rosuvastatin (CRESTOR) 20 MG tablet, Take 1 tablet (20 mg total) by mouth daily., Disp: 30 tablet, Rfl: 2 traMADol (ULTRAM) 50 MG tablet, Take 1 tablet (50 mg total) by mouth every 8 (eight) hours as needed., Disp: 30 tablet, Rfl: 0;  amoxicillin (AMOXIL) 875 MG tablet, Take 1 tablet (875 mg total) by mouth 2 (two) times daily., Disp: 20 tablet, Rfl: 0;  chlorpheniramine-HYDROcodone (TUSSIONEX PENNKINETIC ER) 10-8 MG/5ML LQCR, Take 5 mLs by mouth every 12 (twelve) hours as needed., Disp: 115 mL, Rfl: 0 No Known Allergies Family History  Problem Relation Age of Onset  . Heart attack Brother     Died at 3  . Stroke Father     Died at 20    . Diabetes Sister   . Diabetes Mother   . Stomach cancer Brother   . Heart attack Sister    History   Social History  . Marital Status: Married    Spouse Name: N/A    Number of Children: 2  . Years of Education: N/A   Occupational History  . taken out of work due to back    Social History Main Topics  . Smoking status: Current Some Day Smoker -- 1.00 packs/day for 43 years    Types: Cigarettes    Start date: 04/15/1968  . Smokeless tobacco: Never Used     Comment: quit 2 years ago  . Alcohol Use: No     Comment: 09/20/2012 "Used to; stopped ~ 2009; never had problem w/it"  . Drug Use: No  . Sexual Activity: No   Other Topics Concern  . None   Social History Narrative   Regular exercise: rides horses   Caffeine use: occasionally   Review of Systems: See HPI.  All other ROS are negative.  Physical Examination: BP 146/82 mmHg  Pulse 106  Temp(Src) 98.2 F (36.8 C) (Oral)  Resp 16  Ht 5\' 7"  (1.702 m)  Wt 161 lb 8 oz (73.256 kg)  BMI 25.29 kg/m2  SpO2 100%  General appearance: alert, cooperative and appears stated age Head: Normocephalic, without obvious abnormality, atraumatic, sinuses tender to percussion Eyes: conjunctivae/corneas clear. PERRL, EOM's intact. Fundi benign. Ears: L TM within normal limits.  R TM erythematous and bulging. Nose: moderate congestion, turbinates swollen, sinus tenderness bilateral Throat: lips, mucosa, and tongue normal; teeth and gums normal Neck: no adenopathy, no carotid bruit, no JVD, supple, symmetrical, trachea midline and thyroid not enlarged, symmetric, no tenderness/mass/nodules Lungs: clear to auscultation bilaterally Chest wall: no tenderness  Assessment/Plan: Acute bacterial sinusitis Rx Amoxicillin. IRx tussionex for cough.  Increase fluids. Rest. Saline nasal spray.  Plain Mucinex.  Humidifier in bedroom.

## 2014-02-08 NOTE — Progress Notes (Signed)
Pre visit review using our clinic review tool, if applicable. No additional management support is needed unless otherwise documented below in the visit note/SLS  

## 2014-02-08 NOTE — Telephone Encounter (Signed)
Order faxed. JG//CMA

## 2014-02-13 ENCOUNTER — Ambulatory Visit (INDEPENDENT_AMBULATORY_CARE_PROVIDER_SITE_OTHER): Payer: Medicare Other | Admitting: Family

## 2014-02-13 ENCOUNTER — Encounter: Payer: Self-pay | Admitting: Family

## 2014-02-13 VITALS — BP 136/74 | HR 103 | Temp 97.4°F | Resp 16 | Ht 67.0 in | Wt 159.2 lb

## 2014-02-13 DIAGNOSIS — B9689 Other specified bacterial agents as the cause of diseases classified elsewhere: Secondary | ICD-10-CM

## 2014-02-13 DIAGNOSIS — J019 Acute sinusitis, unspecified: Secondary | ICD-10-CM

## 2014-02-13 DIAGNOSIS — M25561 Pain in right knee: Secondary | ICD-10-CM

## 2014-02-13 MED ORDER — ROSUVASTATIN CALCIUM 40 MG PO TABS: 40.0000 mg | ORAL_TABLET | Freq: Every day | ORAL | Status: DC

## 2014-02-13 MED ORDER — LEVOFLOXACIN 500 MG PO TABS: 500.0000 mg | ORAL_TABLET | Freq: Every day | ORAL | Status: DC

## 2014-02-13 NOTE — Assessment & Plan Note (Signed)
New. Will refer to Dr. Barbaraann Barthel for further evaluation.

## 2014-02-13 NOTE — Telephone Encounter (Signed)
Knee brace order was previously faxed (see 02/06/14 phone note).  Pt confirms that he has been taking Crestor 20mg . Rx sent to pharmacy with new dose and pt made aware of dosage change. Future lab order entered and lab appt scheduled for 03/27/14 at 8:15am. Pt aware.

## 2014-02-13 NOTE — Progress Notes (Signed)
Subjective:    Patient ID: [name], male    DOB: 1945/06/21, 68 y.o.   MRN: [id number]  HPI  [name] is a 68 yr old male who presents today to discuss knee pain.  Right knee. Has been bothering him x greater than 1 month.  Reports some weakness in the leg since he was told that he had a stroke.  Reports that he had swelling of the right knee 1 month ago, but this is now resolved.   Saw Elyn Aquas PA-C on 11/25 with URI. He was given rx for amoxicillin and tussionex. Cough is improved. Nasal drainage is unchanged. He is half way through his abx rx.  Reports generalized weakness since becoming ill last week. Chronic RUE/RLE weakness following his stroke.     Review of Systems    see HPI  Past Medical History  Diagnosis Date  . Hypertension   . High cholesterol   . GERD (gastroesophageal reflux disease)   . GI bleed     a. Recurrent GI bleed, tx with IV iron. b. Per heme notes - likely AVMs 04/2012 (tx with cauterization several months ago).  . Orthostatic hypotension     a. Tx with florinef.  . Hematuria     a. Urology note scan from 07/2012: cystoscopy without evidence for bladder lesion, only lateral hypertrophy of posterior urethra, bladder impression from BPH. b. Pt states he had "some tests" scheduled for later in July 2014.  . Stage III chronic kidney disease     a. Stage 3 (DM with complications ->CKD, peripheral neuropathy).  . Anemia, iron deficiency 03/24/2011    a. Recurrent GI bleed, tx with periodic iron infusions.  . 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 Sleep apnea     "had mask; couldn't sleep in it" (09/20/2012)  . Type II diabetes mellitus     a. Dx 1994, uncontrolled.   . Diabetic peripheral neuropathy 2014    foot pain.  . Chronic lower back pain   . Fatty liver     on CT of 11/2010  . AVM (arteriovenous malformation) of duodenum, acquired     egds in 01/2012, 04/2011  . Polyp, colonic     Colonoscopy 01/2012 "benign" polyp  . CAD (coronary artery  disease)     a. Cath 09/2012: moderate borderline CAD in mid LAD/small diagonal branch, mild RCA stenosis, to be managed medically   . BPH (benign prostatic hyperplasia)   . Prostate cancer   . Stroke   . Peripheral neuropathy   . 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 Marital Status: Married    Spouse Name: N/A    Number of Children: 2  . Years of Education: N/A   Occupational History  . taken out of work due to back    Social History Main Topics  . Smoking status: Current Some Day Smoker -- 1.00 packs/day for 43 years    Types: Cigarettes    Start date: 04/15/1968  . Smokeless tobacco: Never Used     Comment: quit 2 years ago  . Alcohol Use: No     Comment: 09/20/2012 "Used to; stopped ~ 2009; never had problem w/it"  . Drug Use: No  . Sexual Activity: No   Other Topics Concern  . Not on file   Social History Narrative   Regular exercise: rides horses   Caffeine use: occasionally    Past Surgical History  Procedure  Laterality Date  . Shoulder open rotator cuff repair Left 1980's  . Inguinal hernia repair Right 2011  . Lumbar disc surgery  1980's  . Robot assisted laparoscopic radical prostatectomy N/A 01/19/2013    Procedure: ROBOTIC ASSISTED LAPAROSCOPIC RADICAL PROSTATECTOMY;  Surgeon: Bernestine Amass, MD;  Location: [facility];  Service: Urology;  Laterality: N/A;  . Lymphadenectomy Bilateral 01/19/2013    Procedure: LYMPHADENECTOMY;  Surgeon: Bernestine Amass, MD;  Location: [facility];  Service: Urology;  Laterality: Bilateral;  . Enteroscopy N/A 03/01/2013    Procedure: ENTEROSCOPY;  Surgeon: Inda Castle, MD;  Location: [facility];  Service: Endoscopy;  Laterality: N/A;    Family History  Problem Relation Age of Onset  . Heart attack Brother     Died at 57  . Stroke Father     Died at 60  . Diabetes Sister   . Diabetes Mother   . Stomach cancer Brother   . Heart attack Sister     No Known Allergies  Current  Outpatient Prescriptions on File Prior to Visit  Medication Sig Dispense Refill  . aspirin EC 81 MG tablet Take 81 mg by mouth daily.    . chlorpheniramine-HYDROcodone (TUSSIONEX PENNKINETIC ER) 10-8 MG/5ML LQCR Take 5 mLs by mouth every 12 (twelve) hours as needed. 115 mL 0  . gabapentin (NEURONTIN) 300 MG capsule TAKE 1 CAPSULE (300 MG TOTAL) BY MOUTH AT BEDTIME. 30 capsule 3  . HUMALOG 100 UNIT/ML injection USE AS ADVISED - MAXIMUM 25 UNITS PER DAY. INJECT 7-7-5 UNITS PLUS SSI AS ADVISED. 10 mL 0  . hydrALAZINE (APRESOLINE) 10 MG tablet TAKE 1 TABLET BY MOUTH EVERY 8 HOURS 90 tablet 2  . Insulin Glargine (LANTUS) 100 UNIT/ML Solostar Pen Inject 10 Units into the skin every morning.     . Insulin Pen Needle (B-D ULTRAFINE III SHORT PEN) 31G X 8 MM MISC USE AS DIRECTED WITH INSULIN Dx: 250.02 100 each 5  . lisinopril (PRINIVIL,ZESTRIL) 10 MG tablet TAKE 1 TABLET (10 MG TOTAL) BY MOUTH DAILY. 30 tablet 1  . nitroGLYCERIN (NITROSTAT) 0.4 MG SL tablet Place 1 tablet (0.4 mg total) under the tongue every 5 (five) minutes as needed for chest pain (up to 3 doses). 25 tablet 2  . pantoprazole (PROTONIX) 40 MG tablet Take 1 tablet (40 mg total) by mouth daily. 30 tablet 3  . polyvinyl alcohol (LIQUIFILM TEARS) 1.4 % ophthalmic solution Place 1 drop into both eyes daily as needed (dry eyes).    . potassium chloride SA (KLOR-CON M20) 20 MEQ tablet Take 2 tablets (40 mEq total) by mouth once. Take 2 pills a day 60 tablet 6  . traMADol (ULTRAM) 50 MG tablet Take 1 tablet (50 mg total) by mouth every 8 (eight) hours as needed. 30 tablet 0  . rosuvastatin (CRESTOR) 40 MG tablet Take 1 tablet (40 mg total) by mouth daily. 30 tablet 1   No current facility-administered medications on file prior to visit.    BP 136/74 mmHg  Pulse 103  Temp(Src) 97.4 F (36.3 C) (Oral)  Resp 16  Ht 5\' 7"  (1.702 m)  Wt 159 lb 3.2 oz (72.213 kg)  BMI 24.93 kg/m2  SpO2 96%    Objective:   Physical Exam  Constitutional:  He is oriented to person, place, and time. He appears well-developed and well-nourished. No distress.  HENT:  Head: Normocephalic and atraumatic.  Right Ear: Tympanic membrane and ear canal normal.  Left Ear: Tympanic membrane and ear canal normal.  Mouth/Throat: No oropharyngeal exudate or posterior oropharyngeal edema.  No sinus tenderness to palpation.   Cardiovascular: Normal rate and regular rhythm.   No murmur heard. Pulmonary/Chest: Effort normal and breath sounds normal. No respiratory distress. He has no wheezes. He has no rales. He exhibits no tenderness.  Abdominal: Soft. He exhibits no distension and no mass. There is no tenderness. There is no rebound and no guarding.  Musculoskeletal: He exhibits no edema.  Neg drawer sign right.  Mild tenderness to palpation and swelling beneath right patella.   Lymphadenopathy:    He has no cervical adenopathy.  Neurological: He is alert and oriented to person, place, and time.  RUE/RLE strength is 4-5/5 LUE/LLE strength is 5/5  Skin: Skin is warm and dry.  Psychiatric: He has a normal mood and affect. His behavior is normal. Thought content normal.          Assessment & Plan:  Rx signed for knee brace.

## 2014-02-13 NOTE — Progress Notes (Signed)
Pre visit review using our clinic review tool, if applicable. No additional management support is needed unless otherwise documented below in the visit note. 

## 2014-02-13 NOTE — Patient Instructions (Signed)
Stop amoxicillin, start levaquin. Cal if symptoms worsen or if symptoms are not improved in 3 weeks. You will be contacted about your referral to Dr. Barbaraann Barthel for your knee. You may use tylenol as needed for pain.

## 2014-02-13 NOTE — Assessment & Plan Note (Signed)
Unchanged. D/C amoxicillin, change to levaquin.

## 2014-02-14 ENCOUNTER — Telehealth: Payer: Self-pay | Admitting: *Deleted

## 2014-02-14 NOTE — Telephone Encounter (Signed)
Received order via fax from [facility] for right knee brace. Order forwarded to [facility]. JG//CMA

## 2014-02-16 ENCOUNTER — Ambulatory Visit: Payer: Medicare Other | Admitting: Family Medicine

## 2014-02-17 NOTE — Telephone Encounter (Signed)
Order faxed to [facility] at [phone]. JG//CMA

## 2014-02-23 ENCOUNTER — Encounter: Payer: Self-pay | Admitting: Family Medicine

## 2014-02-23 ENCOUNTER — Ambulatory Visit (INDEPENDENT_AMBULATORY_CARE_PROVIDER_SITE_OTHER): Payer: Medicare Other | Admitting: Family Medicine

## 2014-02-23 VITALS — BP 149/80 | HR 112 | Ht 67.0 in | Wt 160.0 lb

## 2014-02-23 DIAGNOSIS — R29898 Other symptoms and signs involving the musculoskeletal system: Secondary | ICD-10-CM

## 2014-02-23 MED ORDER — PREDNISONE (PAK) 10 MG PO TABS: ORAL_TABLET | ORAL | Status: DC

## 2014-02-23 NOTE — Patient Instructions (Signed)
Your knee exam is normal - i don't think you have a problem here. Your history and exam suggest a nerve issue - either a pinched nerve in your back, numbness related to a prior stroke, or nerve irritation below the level of your back. You've already done physical therapy though repeating this is an option. Take prednisone as directed for 6 days - monitor your blood sugars as they're likely to be elevated for about 8 days from taking this but this will not be permanent. Call me in 1-2 weeks to let me know how you're doing. If not improving I would recommend an evaluation with the neurologist.

## 2014-02-28 DIAGNOSIS — R29898 Other symptoms and signs involving the musculoskeletal system: Secondary | ICD-10-CM | POA: Insufficient documentation

## 2014-02-28 NOTE — Progress Notes (Signed)
PCP: [name], NP  Subjective:   HPI: Patient is a 68 y.o. male here for right leg pain/numbness.  Patient reports he has been having numbness from his right thigh down to knee. Associated with weakness and feels like he is dragging this leg as a result. No known injury or trauma. Denies back or knee pain currently. Has history of stroke. No swelling. No other complaints.  Past Medical History  Diagnosis Date  . Hypertension   . High cholesterol   . GERD (gastroesophageal reflux disease)   . GI bleed     a. Recurrent GI bleed, tx with IV iron. b. Per heme notes - likely AVMs 04/2012 (tx with cauterization several months ago).  . Orthostatic hypotension     a. Tx with florinef.  . Hematuria     a. Urology note scan from 07/2012: cystoscopy without evidence for bladder lesion, only lateral hypertrophy of posterior urethra, bladder impression from BPH. b. Pt states he had "some tests" scheduled for later in July 2014.  . Stage III chronic kidney disease     a. Stage 3 (DM with complications ->CKD, peripheral neuropathy).  . Anemia, iron deficiency 03/24/2011    a. Recurrent GI bleed, tx with periodic iron infusions.  . 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 Sleep apnea     "had mask; couldn't sleep in it" (09/20/2012)  . Type II diabetes mellitus     a. Dx 1994, uncontrolled.   . Diabetic peripheral neuropathy 2014    foot pain.  . Chronic lower back pain   . Fatty liver     on CT of 11/2010  . AVM (arteriovenous malformation) of duodenum, acquired     egds in 01/2012, 04/2011  . Polyp, colonic     Colonoscopy 01/2012 "benign" polyp  . CAD (coronary artery disease)     a. Cath 09/2012: moderate borderline CAD in mid LAD/small diagonal branch, mild RCA stenosis, to be managed medically   . BPH (benign prostatic hyperplasia)   . Prostate cancer   . Stroke   . Peripheral neuropathy   . 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 aspirin EC 81 MG tablet Take 81 mg by mouth daily.    . chlorpheniramine-HYDROcodone (TUSSIONEX PENNKINETIC ER) 10-8 MG/5ML LQCR Take 5 mLs by mouth every 12 (twelve) hours as needed. 115 mL 0  . gabapentin (NEURONTIN) 300 MG capsule TAKE 1 CAPSULE (300 MG TOTAL) BY MOUTH AT BEDTIME. 30 capsule 3  . HUMALOG 100 UNIT/ML injection USE AS ADVISED - MAXIMUM 25 UNITS PER DAY. INJECT 7-7-5 UNITS PLUS SSI AS ADVISED. 10 mL 0  . hydrALAZINE (APRESOLINE) 10 MG tablet TAKE 1 TABLET BY MOUTH EVERY 8 HOURS 90 tablet 2  . Insulin Glargine (LANTUS) 100 UNIT/ML Solostar Pen Inject 10 Units into the skin every morning.     . Insulin Pen Needle (B-D ULTRAFINE III SHORT PEN) 31G X 8 MM MISC USE AS DIRECTED WITH INSULIN Dx: 250.02 100 each 5  . levofloxacin (LEVAQUIN) 500 MG tablet Take 1 tablet (500 mg total) by mouth daily. 7 tablet 0  . lisinopril (PRINIVIL,ZESTRIL) 10 MG tablet TAKE 1 TABLET (10 MG TOTAL) BY MOUTH DAILY. 30 tablet 1  . nitroGLYCERIN (NITROSTAT) 0.4 MG SL tablet Place 1 tablet (0.4 mg total) under the tongue every 5 (five) minutes as needed for chest pain (up to 3 doses). 25 tablet 2  .  pantoprazole (PROTONIX) 40 MG tablet Take 1 tablet (40 mg total) by mouth daily. 30 tablet 3  . polyvinyl alcohol (LIQUIFILM TEARS) 1.4 % ophthalmic solution Place 1 drop into both eyes daily as needed (dry eyes).    . potassium chloride SA (KLOR-CON M20) 20 MEQ tablet Take 2 tablets (40 mEq total) by mouth once. Take 2 pills a day 60 tablet 6  . rosuvastatin (CRESTOR) 40 MG tablet Take 1 tablet (40 mg total) by mouth daily. 30 tablet 1  . traMADol (ULTRAM) 50 MG tablet Take 1 tablet (50 mg total) by mouth every 8 (eight) hours as needed. 30 tablet 0   No current facility-administered medications on file prior to visit.    Past Surgical History  Procedure Laterality Date  . Shoulder open rotator cuff repair Left 1980's  . Inguinal hernia  repair Right 2011  . Lumbar disc surgery  1980's  . Robot assisted laparoscopic radical prostatectomy N/A 01/19/2013    Procedure: ROBOTIC ASSISTED LAPAROSCOPIC RADICAL PROSTATECTOMY;  Surgeon: Bernestine Amass, MD;  Location: [facility];  Service: Urology;  Laterality: N/A;  . Lymphadenectomy Bilateral 01/19/2013    Procedure: LYMPHADENECTOMY;  Surgeon: Bernestine Amass, MD;  Location: [facility];  Service: Urology;  Laterality: Bilateral;  . Enteroscopy N/A 03/01/2013    Procedure: ENTEROSCOPY;  Surgeon: Inda Castle, MD;  Location: [facility];  Service: Endoscopy;  Laterality: N/A;  . Left heart catheterization with coronary angiogram N/A 09/21/2012    Procedure: LEFT HEART CATHETERIZATION WITH CORONARY ANGIOGRAM;  Surgeon: Peter M Martinique, MD;  Location: [facility];  Service: Cardiovascular;  Laterality: N/A;    No Known Allergies  History   Social History  . Marital Status: Married    Spouse Name: N/A    Number of Children: 2  . Years of Education: N/A   Occupational History  . taken out of work due to back    Social History Main Topics  . Smoking status: Current Some Day Smoker -- 1.00 packs/day for 43 years    Types: Cigarettes    Start date: 04/15/1968  . Smokeless tobacco: Never Used     Comment: quit 2 years ago  . Alcohol Use: No     Comment: 09/20/2012 "Used to; stopped ~ 2009; never had problem w/it"  . Drug Use: No  . Sexual Activity: No   Other Topics Concern  . Not on file   Social History Narrative   Regular exercise: rides horses   Caffeine use: occasionally    Family History  Problem Relation Age of Onset  . Heart attack Brother     Died at 16  . Stroke Father     Died at 56  . Diabetes Sister   . Diabetes Mother   . Stomach cancer Brother   . Heart attack Sister     BP 149/80 mmHg  Pulse 112  Ht 5\' 7"  (1.702 m)  Wt 160 lb (72.576 kg)  BMI 25.05 kg/m2  Review of Systems: See HPI above.    Objective:  Physical Exam:  Gen: NAD  Right knee: No  gross deformity, ecchymoses, swelling. No TTP. FROM. Negative ant/post drawers. Negative valgus/varus testing. Negative lachmanns. Negative mcmurrays, apleys, patellar apprehension. NV intact distally.  Back: No gross deformity, scoliosis. No TTP .  No midline or bony TTP. FROM without pain. Strength LEs 5/5 all muscle groups.   2+ MSRs in patellar and achilles tendons, equal bilaterally. Negative SLRs. Sensation diminished to light touch mid thigh  to knee anteriorly. Negative logroll bilateral hips Ambulates without foot drop.    Assessment & Plan:  1. Right leg weakness, numbness - Patient's knee and back exams relatively benign besides his numbness currently.  Strength intact grossly as well.  I do not think he has any knee pathology contributing to this condition.  Rather, this is a neurologic issue.  Discussed potential for this to be radiculopathy - we will try prednisone dose pack but if he does not notice a change in his numbness, strength I would then recommend he see a neurologist for further evaluation and workup.  We discussed increased blood sugars while on the prednisone.  He will call us in 1-2 weeks with an update on his status.

## 2014-02-28 NOTE — Assessment & Plan Note (Signed)
Right leg weakness, numbness - Patient's knee and back exams relatively benign besides his numbness currently.  Strength intact grossly as well.  I do not think he has any knee pathology contributing to this condition.  Rather, this is a neurologic issue.  Discussed potential for this to be radiculopathy - we will try prednisone dose pack but if he does not notice a change in his numbness, strength I would then recommend he see a neurologist for further evaluation and workup.  We discussed increased blood sugars while on the prednisone.  He will call us in 1-2 weeks with an update on his status.

## 2014-03-23 ENCOUNTER — Other Ambulatory Visit: Payer: Self-pay | Admitting: Gastroenterology

## 2014-03-23 ENCOUNTER — Telehealth: Payer: Self-pay | Admitting: Family

## 2014-03-23 ENCOUNTER — Other Ambulatory Visit: Payer: Self-pay | Admitting: Internal Medicine

## 2014-03-23 MED ORDER — INSULIN DETEMIR 100 UNIT/ML FLEXPEN: 10.0000 [IU] | PEN_INJECTOR | Freq: Every day | SUBCUTANEOUS | Status: DC

## 2014-03-23 MED ORDER — INSULIN LISPRO 100 UNIT/ML ~~LOC~~ SOLN: SUBCUTANEOUS | Status: DC

## 2014-03-23 NOTE — Telephone Encounter (Signed)
Caller name: Lanagan,Loretta Relation to pt: other  Call back number: [phone]   Reason for call:   Insulin Glargine (LANTUS) 100 UNIT/ML Solostar Pen costly seeking a cheaper medication.

## 2014-03-23 NOTE — Telephone Encounter (Signed)
He can try levemir which may be cheaper for him.

## 2014-03-23 NOTE — Telephone Encounter (Signed)
Please advise 

## 2014-03-24 ENCOUNTER — Encounter: Payer: Self-pay | Admitting: Family

## 2014-03-24 NOTE — Telephone Encounter (Signed)
Sent mychart message to pt. 

## 2014-03-27 ENCOUNTER — Other Ambulatory Visit: Payer: Medicare Other

## 2014-03-27 ENCOUNTER — Other Ambulatory Visit (INDEPENDENT_AMBULATORY_CARE_PROVIDER_SITE_OTHER): Payer: Medicare Other

## 2014-03-27 DIAGNOSIS — E785 Hyperlipidemia, unspecified: Secondary | ICD-10-CM

## 2014-03-27 LAB — LIPID PANEL
Cholesterol: 229 mg/dL — ABNORMAL HIGH (ref 0–200)
HDL: 27.8 mg/dL — ABNORMAL LOW (ref >39.00)
NonHDL: 201.2
TRIGLYCERIDES: 204 mg/dL — AB (ref 0.0–149.0)
Total CHOL/HDL Ratio: 8
VLDL: 40.8 mg/dL — AB (ref 0.0–40.0)

## 2014-03-27 LAB — LDL CHOLESTEROL, DIRECT: LDL DIRECT: 147.9 mg/dL

## 2014-03-28 ENCOUNTER — Telehealth: Payer: Self-pay | Admitting: Family

## 2014-03-28 DIAGNOSIS — E785 Hyperlipidemia, unspecified: Secondary | ICD-10-CM

## 2014-03-28 NOTE — Telephone Encounter (Signed)
Cholesterol is above goal. Is he taking crestor?  If not, he should resume.

## 2014-03-28 NOTE — Telephone Encounter (Signed)
Spoke with pt and he reports daily compliance with Crestor. Please advise.

## 2014-03-29 NOTE — Telephone Encounter (Signed)
Pt notified of below instructions and verbalized understanding.

## 2014-03-29 NOTE — Telephone Encounter (Signed)
  Will refer to lipid clinic.

## 2014-03-30 ENCOUNTER — Encounter: Payer: Self-pay | Admitting: Hematology & Oncology

## 2014-03-30 ENCOUNTER — Ambulatory Visit (HOSPITAL_BASED_OUTPATIENT_CLINIC_OR_DEPARTMENT_OTHER): Payer: Medicare Other | Admitting: Hematology & Oncology

## 2014-03-30 ENCOUNTER — Encounter: Payer: Self-pay | Admitting: Pharmacist

## 2014-03-30 ENCOUNTER — Other Ambulatory Visit (HOSPITAL_BASED_OUTPATIENT_CLINIC_OR_DEPARTMENT_OTHER): Payer: Medicare Other | Admitting: Lab

## 2014-03-30 ENCOUNTER — Encounter: Payer: Self-pay | Admitting: Family

## 2014-03-30 ENCOUNTER — Ambulatory Visit (HOSPITAL_BASED_OUTPATIENT_CLINIC_OR_DEPARTMENT_OTHER): Payer: Medicare Other

## 2014-03-30 VITALS — BP 142/80 | HR 98 | Temp 98.0°F | Resp 16

## 2014-03-30 DIAGNOSIS — Z8546 Personal history of malignant neoplasm of prostate: Secondary | ICD-10-CM | POA: Diagnosis not present

## 2014-03-30 DIAGNOSIS — D509 Iron deficiency anemia, unspecified: Secondary | ICD-10-CM

## 2014-03-30 DIAGNOSIS — D649 Anemia, unspecified: Secondary | ICD-10-CM

## 2014-03-30 DIAGNOSIS — N289 Disorder of kidney and ureter, unspecified: Secondary | ICD-10-CM

## 2014-03-30 DIAGNOSIS — D631 Anemia in chronic kidney disease: Secondary | ICD-10-CM

## 2014-03-30 DIAGNOSIS — N189 Chronic kidney disease, unspecified: Principal | ICD-10-CM

## 2014-03-30 LAB — CBC WITH DIFFERENTIAL (CANCER CENTER ONLY)
BASO#: 0 10*3/uL (ref 0.0–0.2)
BASO%: 0.8 % (ref 0.0–2.0)
EOS ABS: 0.1 10*3/uL (ref 0.0–0.5)
EOS%: 2.3 % (ref 0.0–7.0)
HCT: 24.9 % — ABNORMAL LOW (ref 38.7–49.9)
HEMOGLOBIN: 8 g/dL — AB (ref 13.0–17.1)
LYMPH#: 0.9 10*3/uL (ref 0.9–3.3)
LYMPH%: 16.5 % (ref 14.0–48.0)
MCH: 26.4 pg — AB (ref 28.0–33.4)
MCHC: 32.1 g/dL (ref 32.0–35.9)
MCV: 82 fL (ref 82–98)
MONO#: 0.7 10*3/uL (ref 0.1–0.9)
MONO%: 13.6 % — AB (ref 0.0–13.0)
NEUT%: 66.8 % (ref 40.0–80.0)
NEUTROS ABS: 3.5 10*3/uL (ref 1.5–6.5)
PLATELETS: 456 10*3/uL — AB (ref 145–400)
RBC: 3.03 10*6/uL — ABNORMAL LOW (ref 4.20–5.70)
RDW: 14 % (ref 11.1–15.7)
WBC: 5.2 10*3/uL (ref 4.0–10.0)

## 2014-03-30 LAB — COMPREHENSIVE METABOLIC PANEL
ALBUMIN: 2.5 g/dL — AB (ref 3.5–5.2)
ALT: 8 U/L (ref 0–53)
AST: 9 U/L (ref 0–37)
Alkaline Phosphatase: 112 U/L (ref 39–117)
BUN: 16 mg/dL (ref 6–23)
CALCIUM: 8.3 mg/dL — AB (ref 8.4–10.5)
CO2: 25 mEq/L (ref 19–32)
CREATININE: 1.59 mg/dL — AB (ref 0.50–1.35)
Chloride: 104 mEq/L (ref 96–112)
GLUCOSE: 279 mg/dL — AB (ref 70–99)
Potassium: 4.1 mEq/L (ref 3.5–5.3)
Sodium: 137 mEq/L (ref 135–145)
TOTAL PROTEIN: 5.9 g/dL — AB (ref 6.0–8.3)
Total Bilirubin: 0.3 mg/dL (ref 0.2–1.2)

## 2014-03-30 LAB — RETICULOCYTES (CHCC)
ABS Retic: 65.3 10*3/uL (ref 19.0–186.0)
RBC.: 3.11 MIL/uL — AB (ref 4.22–5.81)
Retic Ct Pct: 2.1 % (ref 0.4–2.3)

## 2014-03-30 LAB — IRON AND TIBC CHCC
%SAT: 5 % — ABNORMAL LOW (ref 20–55)
Iron: 14 ug/dL — ABNORMAL LOW (ref 42–163)
TIBC: 270 ug/dL (ref 202–409)
UIBC: 257 ug/dL (ref 117–376)

## 2014-03-30 LAB — FERRITIN CHCC: FERRITIN: 18 ng/mL — AB (ref 22–316)

## 2014-03-30 MED ORDER — DARBEPOETIN ALFA 300 MCG/0.6ML IJ SOSY
PREFILLED_SYRINGE | INTRAMUSCULAR | Status: AC
  Filled 2014-03-30: qty 0.6

## 2014-03-30 MED ORDER — DARBEPOETIN ALFA 300 MCG/0.6ML IJ SOSY
300.0000 ug | PREFILLED_SYRINGE | Freq: Once | INTRAMUSCULAR | Status: DC
  Administered 2014-03-30: 300 ug via SUBCUTANEOUS

## 2014-03-30 MED ORDER — SODIUM CHLORIDE 0.9 % IV SOLN
510.0000 mg | Freq: Once | INTRAVENOUS | Status: DC
  Administered 2014-03-30: 510 mg via INTRAVENOUS
  Filled 2014-03-30: qty 17

## 2014-03-30 NOTE — Progress Notes (Signed)
[place]  [phone] [phone]  ID: [name] OB: 11/22/1945 MR#: [id number] JYN#:[id number] Patient Care Team: [name], NP as PCP - General (Internal Medicine)  DIAGNOSIS: 1. Recurrent iron-deficiency anemia secondary to gastrointestinal  bleeding.  2. 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  3. History of stage I prostate cancer.  INTERVAL HISTORY: [name] is here today for a follow-up. He is feeling tired. His Hgb today is 8.0 MCV 82. He last had iron and Aranesp in October.   He's had no problems since his prostate cancer surgery. He is followed by urology.  He does still have some tingling in his hands and feet at times. He has some aches and pains with his arthritis. No swelling in his extremities.  He denies fever chills, n/v, cough, rash, headache, dizziness, SOB, chest pain, palpitations, abdominal pain, constipation, diarrhea, blood in urine or stool. No bleeding. His appetite is good and he is staying hydrated. His weight is stable at 154 lbs.   CURRENT TREATMENT: 1. Aranesp 300 mcg subcu as needed for hemoglobin less than 10.  2. IV iron as indicated.  REVIEW OF SYSTEMS: All other 10 point review of systems is negative.   PAST MEDICAL HISTORY: Past Medical History  Diagnosis Date  . Hypertension   . High cholesterol   . GERD (gastroesophageal reflux disease)   . GI bleed     a. Recurrent GI bleed, tx with IV iron. b. Per heme notes - likely AVMs 04/2012 (tx with cauterization several months ago).  . Orthostatic hypotension     a. Tx with florinef.  . Hematuria     a. Urology note scan from 07/2012: cystoscopy without evidence for bladder lesion, only lateral hypertrophy of posterior urethra, bladder impression from BPH. b. Pt states he had "some tests" scheduled for later in July 2014.  . Stage III chronic kidney disease     a. Stage 3 (DM with complications ->CKD, peripheral  neuropathy).  . Anemia, iron deficiency 03/24/2011    a. Recurrent GI bleed, tx with periodic iron infusions.  . 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 Sleep apnea     "had mask; couldn't sleep in it" (09/20/2012)  . Type II diabetes mellitus     a. Dx 1994, uncontrolled.   . Diabetic peripheral neuropathy 2014    foot pain.  . Chronic lower back pain   . Fatty liver     on CT of 11/2010  . AVM (arteriovenous malformation) of duodenum, acquired     egds in 01/2012, 04/2011  . Polyp, colonic     Colonoscopy 01/2012 "benign" polyp  . CAD (coronary artery disease)     a. Cath 09/2012: moderate borderline CAD in mid LAD/small diagonal branch, mild RCA stenosis, to be managed medically   . BPH (benign prostatic hyperplasia)   . Prostate cancer   . Stroke   . Peripheral neuropathy   . 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 Shoulder open rotator cuff repair Left 1980's  . Inguinal hernia repair Right 2011  . Lumbar disc surgery  1980's  . Robot assisted laparoscopic radical prostatectomy N/A 01/19/2013    Procedure: ROBOTIC ASSISTED LAPAROSCOPIC RADICAL PROSTATECTOMY;  Surgeon: Bernestine Amass, MD;  Location: [facility];  Service: Urology;  Laterality: N/A;  . Lymphadenectomy Bilateral 01/19/2013    Procedure: LYMPHADENECTOMY;  Surgeon: Bernestine Amass, MD;  Location: [facility];  Service: Urology;  Laterality: Bilateral;  . Enteroscopy N/A 03/01/2013    Procedure: ENTEROSCOPY;  Surgeon: Inda Castle, MD;  Location: [facility];  Service: Endoscopy;  Laterality: N/A;  . Left heart catheterization with coronary angiogram N/A 09/21/2012    Procedure: LEFT HEART CATHETERIZATION WITH CORONARY ANGIOGRAM;  Surgeon: Peter M Martinique, MD;  Location: [facility];  Service: Cardiovascular;  Laterality: N/A;    FAMILY HISTORY Family History  Problem Relation Age of Onset  . Heart attack Brother     Died at 58  . Stroke  Father     Died at 64  . Diabetes Sister   . Diabetes Mother   . Stomach cancer Brother   . Heart attack Sister     GYNECOLOGIC HISTORY:  No LMP for male patient.   SOCIAL HISTORY: History   Social History  . Marital Status: Married    Spouse Name: N/A    Number of Children: 2  . Years of Education: N/A   Occupational History  . taken out of work due to back    Social History Main Topics  . Smoking status: Current Some Day Smoker -- 1.00 packs/day for 43 years    Types: Cigarettes    Start date: 04/15/1968  . Smokeless tobacco: Never Used     Comment: 03-2014  still smoking  . Alcohol Use: No     Comment: 09/20/2012 "Used to; stopped ~ 2009; never had problem w/it"  . Drug Use: No  . Sexual Activity: No   Other Topics Concern  . Not on file   Social History Narrative   Regular exercise: rides horses   Caffeine use: occasionally    ADVANCED DIRECTIVES:  <no information>  HEALTH MAINTENANCE: History  Substance Use Topics  . Smoking status: Current Some Day Smoker -- 1.00 packs/day for 43 years    Types: Cigarettes    Start date: 04/15/1968  . Smokeless tobacco: Never Used     Comment: 03-2014  still smoking  . Alcohol Use: No     Comment: 09/20/2012 "Used to; stopped ~ 2009; never had problem w/it"   Colonoscopy: PAP: Bone density: Lipid panel:  No Known Allergies  Current Outpatient Prescriptions  Medication Sig Dispense Refill  . aspirin EC 81 MG tablet Take 81 mg by mouth daily.    [name] Kitchen gabapentin (NEURONTIN) 300 MG capsule TAKE 1 CAPSULE (300 MG TOTAL) BY MOUTH AT BEDTIME. 30 capsule 3  . hydrALAZINE (APRESOLINE) 10 MG tablet TAKE 1 TABLET BY MOUTH EVERY 8 HOURS 90 tablet 2  . Insulin Detemir (LEVEMIR) 100 UNIT/ML Pen Inject 10 Units into the skin daily at 10 pm. 15 mL 5  . insulin lispro (HUMALOG) 100 UNIT/ML injection USE AS ADVISED - MAXIMUM 25 UNITS PER DAY. INJECT 7-7-5 UNITS PLUS SSI AS ADVISED. 10 mL 0  . Insulin Pen Needle (B-D ULTRAFINE III  SHORT PEN) 31G X 8 MM MISC USE AS DIRECTED WITH INSULIN Dx: 250.02 100 each 5  . levofloxacin (LEVAQUIN) 500 MG tablet Take 1 tablet (500 mg total) by mouth daily. 7 tablet 0  . lisinopril (PRINIVIL,ZESTRIL) 10 MG tablet TAKE 1 TABLET (10 MG TOTAL) BY MOUTH DAILY. 30 tablet 1  . NEXIUM 40 MG capsule TAKE 1 CAPSULE (40 MG TOTAL) BY MOUTH DAILY AT 12 NOON. 30 capsule 8  . pantoprazole (PROTONIX) 40 MG tablet Take 1 tablet (40 mg total) by mouth daily. 30 tablet 3  . potassium chloride SA (KLOR-CON M20)  20 MEQ tablet Take 2 tablets (40 mEq total) by mouth once. Take 2 pills a day 60 tablet 6  . rosuvastatin (CRESTOR) 40 MG tablet Take 1 tablet (40 mg total) by mouth daily. 30 tablet 1  . traMADol (ULTRAM) 50 MG tablet Take 1 tablet (50 mg total) by mouth every 8 (eight) hours as needed. 30 tablet 0  . chlorpheniramine-HYDROcodone (TUSSIONEX PENNKINETIC ER) 10-8 MG/5ML LQCR Take 5 mLs by mouth every 12 (twelve) hours as needed. (Patient not taking: Reported on 03/30/2014) 115 mL 0  . [employer] VITAMIN D 2000 UNITS CAPS Take 1 capsule by mouth daily.  11  . nitroGLYCERIN (NITROSTAT) 0.4 MG SL tablet Place 1 tablet (0.4 mg total) under the tongue every 5 (five) minutes as needed for chest pain (up to 3 doses). (Patient not taking: Reported on 03/30/2014) 25 tablet 2  . polyvinyl alcohol (LIQUIFILM TEARS) 1.4 % ophthalmic solution Place 1 drop into both eyes daily as needed (dry eyes).     Current Facility-Administered Medications  Medication Dose Route Frequency Provider Last Rate Last Dose  . Darbepoetin Alfa (ARANESP) injection 300 mcg  300 mcg Subcutaneous Once Eliezer Bottom, NP      . ferumoxytol [facility]) 510 mg in sodium chloride 0.9 % 100 mL IVPB  510 mg Intravenous Once Eliezer Bottom, NP        OBJECTIVE: Filed Vitals:   03/30/14 0842  BP: 148/83  Pulse: 101  Temp: 97.3 F (36.3 C)  Resp: 18    Filed Weights   03/30/14 0842  Weight: 154 lb (69.854 kg)   ECOG FS:1 - Symptomatic but  completely ambulatory Ocular: Sclerae unicteric, pupils equal, round and reactive to light Ear-nose-throat: Oropharynx clear, dentition fair Lymphatic: No cervical or supraclavicular adenopathy Lungs no rales or rhonchi, good excursion bilaterally Heart regular rate and rhythm, no murmur appreciated Abd soft, nontender, positive bowel sounds MSK no focal spinal tenderness, no joint edema Neuro: non-focal, well-oriented, appropriate affect  LAB RESULTS: CMP     Component Value Date/Time   NA 139 02/03/2014 0935   NA 141 12/29/2013 0810   K 4.1 02/03/2014 0935   K 3.9 12/29/2013 0810   CL 107 02/03/2014 0935   CL 105 12/29/2013 0810   CO2 27 02/03/2014 0935   CO2 26 12/29/2013 0810   GLUCOSE 188* 02/03/2014 0935   GLUCOSE 165* 12/29/2013 0810   BUN 19 02/03/2014 0935   BUN 13 12/29/2013 0810   CREATININE 1.5 02/03/2014 0935   CREATININE 1.5* 12/29/2013 0810   CALCIUM 8.8 02/03/2014 0935   CALCIUM 8.5 12/29/2013 0810   PROT 6.2* 12/29/2013 0810   PROT 5.4* 05/24/2013 0915   ALBUMIN 2.7* 05/24/2013 0915   AST 12 12/29/2013 0810   AST 11 05/24/2013 0915   ALT 11 12/29/2013 0810   ALT 9 05/24/2013 0915   ALKPHOS 91* 12/29/2013 0810   ALKPHOS 102 05/24/2013 0915   BILITOT 0.60 12/29/2013 0810   BILITOT 0.5 05/24/2013 0915   GFRNONAA 65* 03/08/2013 1135   GFRAA 76* 03/08/2013 1135   No results found for: SPEP Lab Results  Component Value Date   WBC 5.2 03/30/2014   NEUTROABS 3.5 03/30/2014   HGB 8.0* 03/30/2014   HCT 24.9* 03/30/2014   MCV 82 03/30/2014   PLT 456* 03/30/2014   No results found for: LABCA2 No components found for: TIWPY099 No results for input(s): INR in the last 168 hours.  STUDIES: No results found.  ASSESSMENT/PLAN: [name] is 70 year old  gentleman with iron deficiency anemia. He is feeling tired. His Hgb today is 8.0 and MCV 82.  We will go ahead and give him Fereheme 510 today.  We will also give him Aranesp 300 today.  We will see him  back in 3 months for labs and follow-up.  He knows to call here with any questions or concerns and to go to the ED in the event of an emergency. We can certainly see him sooner is need be.   Eliezer Bottom, NP 03/30/2014 9:16 AM

## 2014-03-30 NOTE — Patient Instructions (Signed)
Darbepoetin Alfa injection What is this medicine? DARBEPOETIN ALFA (dar be POE e tin AL fa) helps your body make more red blood cells. It is used to treat anemia caused by chronic kidney failure and chemotherapy. This medicine may be used for other purposes; ask your health care provider or pharmacist if you have questions. COMMON BRAND NAME(S): Aranesp What should I tell my health care provider before I take this medicine? They need to know if you have any of these conditions: -blood clotting disorders or history of blood clots -cancer patient not on chemotherapy -cystic fibrosis -heart disease, such as angina, heart failure, or a history of a heart attack -hemoglobin level of 12 g/dL or greater -high blood pressure -low levels of folate, iron, or vitamin B12 -seizures -an unusual or allergic reaction to darbepoetin, erythropoietin, albumin, hamster proteins, latex, other medicines, foods, dyes, or preservatives -pregnant or trying to get pregnant -breast-feeding How should I use this medicine? This medicine is for injection into a vein or under the skin. It is usually given by a health care professional in a hospital or clinic setting. If you get this medicine at home, you will be taught how to prepare and give this medicine. Do not shake the solution before you withdraw a dose. Use exactly as directed. Take your medicine at regular intervals. Do not take your medicine more often than directed. It is important that you put your used needles and syringes in a special sharps container. Do not put them in a trash can. If you do not have a sharps container, call your pharmacist or healthcare provider to get one. Talk to your pediatrician regarding the use of this medicine in children. While this medicine may be used in children as young as 1 year for selected conditions, precautions do apply. Overdosage: If you think you have taken too much of this medicine contact a poison control center or  emergency room at once. NOTE: This medicine is only for you. Do not share this medicine with others. What if I miss a dose? If you miss a dose, take it as soon as you can. If it is almost time for your next dose, take only that dose. Do not take double or extra doses. What may interact with this medicine? Do not take this medicine with any of the following medications: -epoetin alfa This list may not describe all possible interactions. Give your health care provider a list of all the medicines, herbs, non-prescription drugs, or dietary supplements you use. Also tell them if you smoke, drink alcohol, or use illegal drugs. Some items may interact with your medicine. What should I watch for while using this medicine? Visit your prescriber or health care professional for regular checks on your progress and for the needed blood tests and blood pressure measurements. It is especially important for the doctor to make sure your hemoglobin level is in the desired range, to limit the risk of potential side effects and to give you the best benefit. Keep all appointments for any recommended tests. Check your blood pressure as directed. Ask your doctor what your blood pressure should be and when you should contact him or her. As your body makes more red blood cells, you may need to take iron, folic acid, or vitamin B supplements. Ask your doctor or health care provider which products are right for you. If you have kidney disease continue dietary restrictions, even though this medication can make you feel better. Talk with your doctor or health   care professional about the foods you eat and the vitamins that you take. What side effects may I notice from receiving this medicine? Side effects that you should report to your doctor or health care professional as soon as possible: -allergic reactions like skin rash, itching or hives, swelling of the face, lips, or tongue -breathing problems -changes in vision -chest  pain -confusion, trouble speaking or understanding -feeling faint or lightheaded, falls -high blood pressure -muscle aches or pains -pain, swelling, warmth in the leg -rapid weight gain -severe headaches -sudden numbness or weakness of the face, arm or leg -trouble walking, dizziness, loss of balance or coordination -seizures (convulsions) -swelling of the ankles, feet, hands -unusually weak or tired Side effects that usually do not require medical attention (report to your doctor or health care professional if they continue or are bothersome): -diarrhea -fever, chills (flu-like symptoms) -headaches -nausea, vomiting -redness, stinging, or swelling at site where injected This list may not describe all possible side effects. Call your doctor for medical advice about side effects. You may report side effects to FDA at 1-800-FDA-1088. Where should I keep my medicine? Keep out of the reach of children. Store in a refrigerator between 2 and 8 degrees C (36 and 46 degrees F). Do not freeze. Do not shake. Throw away any unused portion if using a single-dose vial. Throw away any unused medicine after the expiration date. NOTE: This sheet is a summary. It may not cover all possible information. If you have questions about this medicine, talk to your doctor, pharmacist, or health care provider.  2015, Elsevier/Gold Standard. (2008-02-15 10:23:57)  Ferumoxytol injection What is this medicine? FERUMOXYTOL is an iron complex. Iron is used to make healthy red blood cells, which carry oxygen and nutrients throughout the body. This medicine is used to treat iron deficiency anemia in people with chronic kidney disease. This medicine may be used for other purposes; ask your health care provider or pharmacist if you have questions. COMMON BRAND NAME(S): Feraheme What should I tell my health care provider before I take this medicine? They need to know if you have any of these conditions: -anemia not  caused by low iron levels -high levels of iron in the blood -magnetic resonance imaging (MRI) test scheduled -an unusual or allergic reaction to iron, other medicines, foods, dyes, or preservatives -pregnant or trying to get pregnant -breast-feeding How should I use this medicine? This medicine is for injection into a vein. It is given by a health care professional in a hospital or clinic setting. Talk to your pediatrician regarding the use of this medicine in children. Special care may be needed. Overdosage: If you think you've taken too much of this medicine contact a poison control center or emergency room at once. Overdosage: If you think you have taken too much of this medicine contact a poison control center or emergency room at once. NOTE: This medicine is only for you. Do not share this medicine with others. What if I miss a dose? It is important not to miss your dose. Call your doctor or health care professional if you are unable to keep an appointment. What may interact with this medicine? This medicine may interact with the following medications: -other iron products This list may not describe all possible interactions. Give your health care provider a list of all the medicines, herbs, non-prescription drugs, or dietary supplements you use. Also tell them if you smoke, drink alcohol, or use illegal drugs. Some items may interact with your   medicine. What should I watch for while using this medicine? Visit your doctor or healthcare professional regularly. Tell your doctor or healthcare professional if your symptoms do not start to get better or if they get worse. You may need blood work done while you are taking this medicine. You may need to follow a special diet. Talk to your doctor. Foods that contain iron include: whole grains/cereals, dried fruits, beans, or peas, leafy green vegetables, and organ meats (liver, kidney). What side effects may I notice from receiving this  medicine? Side effects that you should report to your doctor or health care professional as soon as possible: -allergic reactions like skin rash, itching or hives, swelling of the face, lips, or tongue -breathing problems -changes in blood pressure -feeling faint or lightheaded, falls -fever or chills -flushing, sweating, or hot feelings -swelling of the ankles or feet Side effects that usually do not require medical attention (Report these to your doctor or health care professional if they continue or are bothersome.): -diarrhea -headache -nausea, vomiting -stomach pain This list may not describe all possible side effects. Call your doctor for medical advice about side effects. You may report side effects to FDA at 1-800-FDA-1088. Where should I keep my medicine? This drug is given in a hospital or clinic and will not be stored at home. NOTE: This sheet is a summary. It may not cover all possible information. If you have questions about this medicine, talk to your doctor, pharmacist, or health care provider.  2015, Elsevier/Gold Standard. (2011-10-17 15:23:36)  

## 2014-03-31 ENCOUNTER — Ambulatory Visit: Payer: Medicare Other | Admitting: Pharmacist

## 2014-03-31 ENCOUNTER — Encounter: Payer: Self-pay | Admitting: Hematology & Oncology

## 2014-04-07 ENCOUNTER — Ambulatory Visit: Payer: Medicare Other

## 2014-04-11 ENCOUNTER — Other Ambulatory Visit: Payer: Self-pay | Admitting: Family

## 2014-04-12 NOTE — Telephone Encounter (Signed)
Rx request to pharmacy/SLS  

## 2014-04-13 ENCOUNTER — Ambulatory Visit (HOSPITAL_BASED_OUTPATIENT_CLINIC_OR_DEPARTMENT_OTHER): Payer: Medicare Other

## 2014-04-13 VITALS — BP 175/81 | HR 81 | Temp 98.0°F

## 2014-04-13 DIAGNOSIS — D509 Iron deficiency anemia, unspecified: Secondary | ICD-10-CM

## 2014-04-13 MED ORDER — SODIUM CHLORIDE 0.9 % IV SOLN
510.0000 mg | Freq: Once | INTRAVENOUS | Status: AC
  Administered 2014-04-13: 510 mg via INTRAVENOUS
  Filled 2014-04-13: qty 17

## 2014-04-13 MED ORDER — SODIUM CHLORIDE 0.9 % IV SOLN
Freq: Once | INTRAVENOUS | Status: AC
  Administered 2014-04-13: 15:00:00 via INTRAVENOUS

## 2014-04-13 NOTE — Patient Instructions (Signed)

## 2014-04-17 ENCOUNTER — Telehealth: Payer: Self-pay | Admitting: *Deleted

## 2014-04-17 NOTE — Telephone Encounter (Signed)
Received fax from [facility] requesting order for diabetic testing supplies. Left detailed message on pt's cell# to call and verify request.

## 2014-04-18 NOTE — Telephone Encounter (Signed)
Spoke with pt. He prefers not to use [employer] for diabetic testing supplies at this time. Order discarded.

## 2014-04-19 ENCOUNTER — Encounter: Payer: Self-pay | Admitting: Family

## 2014-04-19 ENCOUNTER — Other Ambulatory Visit: Payer: Self-pay | Admitting: Family

## 2014-04-20 MED ORDER — ROSUVASTATIN CALCIUM 40 MG PO TABS: 40.0000 mg | ORAL_TABLET | Freq: Every day | ORAL | Status: DC

## 2014-04-20 NOTE — Telephone Encounter (Signed)
Called to verify which cholesterol medication needed.  Pt requesting a refill on Crestor.    rosuvastatin (CRESTOR) 40 MG tablet Last filled: 02/13/14 Amt: 30, 1 refill Last OV/Labs:02/03/14  Med filled x 3 mths.

## 2014-04-20 NOTE — Telephone Encounter (Signed)
Last filled: 12/13/13 Last OV/Labs:  02/03/14  Med filled x 3 mths.

## 2014-04-25 ENCOUNTER — Encounter: Payer: Self-pay | Admitting: Family

## 2014-05-18 ENCOUNTER — Other Ambulatory Visit: Payer: Self-pay | Admitting: Family

## 2014-05-19 NOTE — Telephone Encounter (Signed)
Gabapentin refill sent to pharmacy. Pt last seen by PCP 01/2014 and has no future appts scheduled. When should pt follow up in the office?

## 2014-05-19 NOTE — Telephone Encounter (Signed)
Pt due for follow up 

## 2014-05-26 NOTE — Telephone Encounter (Signed)
Pt scheduled follow up for 06/02/2014

## 2014-06-02 ENCOUNTER — Ambulatory Visit: Payer: Medicare Other | Admitting: Family

## 2014-06-08 ENCOUNTER — Other Ambulatory Visit: Payer: Self-pay | Admitting: Family

## 2014-06-13 ENCOUNTER — Telehealth: Payer: Self-pay | Admitting: Family

## 2014-06-13 NOTE — Telephone Encounter (Signed)
PT was no show for follow up appointment on 3/18- has rescheduled for 06/23/14- charge?

## 2014-06-14 NOTE — Telephone Encounter (Signed)
Yes please

## 2014-06-23 ENCOUNTER — Telehealth: Payer: Self-pay | Admitting: Family

## 2014-06-23 ENCOUNTER — Ambulatory Visit (INDEPENDENT_AMBULATORY_CARE_PROVIDER_SITE_OTHER): Payer: Medicare Other | Admitting: Family

## 2014-06-23 ENCOUNTER — Encounter: Payer: Self-pay | Admitting: Family

## 2014-06-23 VITALS — BP 130/70 | HR 107 | Temp 97.4°F | Resp 18 | Ht 67.0 in | Wt 153.6 lb

## 2014-06-23 DIAGNOSIS — E1140 Type 2 diabetes mellitus with diabetic neuropathy, unspecified: Secondary | ICD-10-CM | POA: Diagnosis not present

## 2014-06-23 DIAGNOSIS — E785 Hyperlipidemia, unspecified: Secondary | ICD-10-CM

## 2014-06-23 DIAGNOSIS — E1165 Type 2 diabetes mellitus with hyperglycemia: Secondary | ICD-10-CM

## 2014-06-23 DIAGNOSIS — R29898 Other symptoms and signs involving the musculoskeletal system: Secondary | ICD-10-CM

## 2014-06-23 DIAGNOSIS — IMO0002 Reserved for concepts with insufficient information to code with codable children: Secondary | ICD-10-CM

## 2014-06-23 LAB — BASIC METABOLIC PANEL
BUN: 20 mg/dL (ref 6–23)
CO2: 26 mEq/L (ref 19–32)
Calcium: 8.6 mg/dL (ref 8.4–10.5)
Chloride: 105 mEq/L (ref 96–112)
Creatinine, Ser: 1.82 mg/dL — ABNORMAL HIGH (ref 0.40–1.50)
GFR: 47.94 mL/min — ABNORMAL LOW (ref >60.00)
Glucose, Bld: 221 mg/dL — ABNORMAL HIGH (ref 70–99)
POTASSIUM: 4.5 meq/L (ref 3.5–5.1)
Sodium: 136 mEq/L (ref 135–145)

## 2014-06-23 LAB — HEMOGLOBIN A1C: HEMOGLOBIN A1C: 9.4 % — AB (ref 4.6–6.5)

## 2014-06-23 LAB — LIPID PANEL
CHOL/HDL RATIO: 7
Cholesterol: 229 mg/dL — ABNORMAL HIGH (ref 0–200)
HDL: 31.3 mg/dL — ABNORMAL LOW (ref >39.00)
NONHDL: 197.7
TRIGLYCERIDES: 209 mg/dL — AB (ref 0.0–149.0)
VLDL: 41.8 mg/dL — ABNORMAL HIGH (ref 0.0–40.0)

## 2014-06-23 LAB — LDL CHOLESTEROL, DIRECT: Direct LDL: 133 mg/dL

## 2014-06-23 MED ORDER — EZETIMIBE 10 MG PO TABS: 10.0000 mg | ORAL_TABLET | Freq: Every day | ORAL | Status: DC

## 2014-06-23 NOTE — Progress Notes (Signed)
Pre visit review using our clinic review tool, if applicable. No additional management support is needed unless otherwise documented below in the visit note. 

## 2014-06-23 NOTE — Progress Notes (Signed)
Subjective:    Patient ID: [name], male    DOB: June 09, 1945, 69 y.o.   MRN: [id number]  HPI  [name] is a 69 yr old male who presents today for follow up of multiple medical problems.  Patient presents today for follow up of multiple medical problems.  Diabetes Type 2  Pt is currently maintained on the following medications for diabetes:levemir, humalog, followed by endo  Lab Results  Component Value Date   HGBA1C 9.5* 02/03/2014   HGBA1C 10.6* 09/07/2013   HGBA1C 10.9* 05/24/2013    Lab Results  Component Value Date   MICROALBUR 1089.9* 02/03/2014   LDLCALC 124* 02/03/2014   CREATININE 1.59* 03/30/2014    Last diabetic eye exam was : July 2015 Denies polyuria/polydipsia. Denies hypoglycemia Not compliant with diet Home glucose readings range fasting- 210  Hyperlipidemia  Patient is currently maintained on the following medication for hyperlipidemia: crestor Last lipid panel as follows:  Lab Results  Component Value Date   CHOL 229* 03/27/2014   HDL 27.80* 03/27/2014   LDLCALC 124* 02/03/2014   LDLDIRECT 147.9 03/27/2014   TRIG 204.0* 03/27/2014   CHOLHDL 8 03/27/2014   Patient denies myalgia. Lab Results  Component Value Date   CHOL 229* 03/27/2014   HDL 27.80* 03/27/2014   LDLCALC 124* 02/03/2014   LDLDIRECT 147.9 03/27/2014   TRIG 204.0* 03/27/2014   CHOLHDL 8 03/27/2014    Patient reports good compliance with low fat/low cholesterol diet.   Hypertension  Patient is currently maintained on the following medications for blood pressure: hydralzine, lisinopril, pt thinks he is also on norvasc and will check at home and let us know.  Patient reports good compliance with blood pressure medications. Patient denies chest pain, shortness of breath or swelling. Last 3 blood pressure readings in our office are as follows: BP Readings from Last 3 Encounters:  06/23/14 130/70  04/13/14 175/81  03/30/14 142/80    Some swelling and weakness in the  right leg for several months. Saw Dr. Barbaraann Barthel in December, never followed back up with Dr. Judene Companion.  He reports + numbness.       Review of Systems See HPI  Past Medical History  Diagnosis Date  . Hypertension   . High cholesterol   . GERD (gastroesophageal reflux disease)   . GI bleed     a. Recurrent GI bleed, tx with IV iron. b. Per heme notes - likely AVMs 04/2012 (tx with cauterization several months ago).  . Orthostatic hypotension     a. Tx with florinef.  . Hematuria     a. Urology note scan from 07/2012: cystoscopy without evidence for bladder lesion, only lateral hypertrophy of posterior urethra, bladder impression from BPH. b. Pt states he had "some tests" scheduled for later in July 2014.  . Stage III chronic kidney disease     a. Stage 3 (DM with complications ->CKD, peripheral neuropathy).  . Anemia, iron deficiency 03/24/2011    a. Recurrent GI bleed, tx with periodic iron infusions.  . 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 Sleep apnea     "had mask; couldn't sleep in it" (09/20/2012)  . Type II diabetes mellitus     a. Dx 1994, uncontrolled.   . Diabetic peripheral neuropathy 2014    foot pain.  . Chronic lower back pain   . Fatty liver     on CT of 11/2010  . AVM (arteriovenous malformation) of duodenum, acquired     egds in 01/2012,  04/2011  . Polyp, colonic     Colonoscopy 01/2012 "benign" polyp  . CAD (coronary artery disease)     a. Cath 09/2012: moderate borderline CAD in mid LAD/small diagonal branch, mild RCA stenosis, to be managed medically   . BPH (benign prostatic hyperplasia)   . Prostate cancer   . Stroke   . Peripheral neuropathy   . 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 Marital Status: Married    Spouse Name: N/A  . Number of Children: 2  . Years of Education: N/A   Occupational History  . taken out of work due to back    Social History Main Topics  . Smoking status: Current Some Day Smoker --  1.00 packs/day for 43 years    Types: Cigarettes    Start date: 04/15/1968  . Smokeless tobacco: Never Used     Comment: 03-2014  still smoking  . Alcohol Use: No     Comment: 09/20/2012 "Used to; stopped ~ 2009; never had problem w/it"  . Drug Use: No  . Sexual Activity: No   Other Topics Concern  . Not on file   Social History Narrative   Regular exercise: rides horses   Caffeine use: occasionally    Past Surgical History  Procedure Laterality Date  . Shoulder open rotator cuff repair Left 1980's  . Inguinal hernia repair Right 2011  . Lumbar disc surgery  1980's  . Robot assisted laparoscopic radical prostatectomy N/A 01/19/2013    Procedure: ROBOTIC ASSISTED LAPAROSCOPIC RADICAL PROSTATECTOMY;  Surgeon: Bernestine Amass, MD;  Location: [facility];  Service: Urology;  Laterality: N/A;  . Lymphadenectomy Bilateral 01/19/2013    Procedure: LYMPHADENECTOMY;  Surgeon: Bernestine Amass, MD;  Location: [facility];  Service: Urology;  Laterality: Bilateral;  . Enteroscopy N/A 03/01/2013    Procedure: ENTEROSCOPY;  Surgeon: Inda Castle, MD;  Location: [place];  Service: Endoscopy;  Laterality: N/A;  . Left heart catheterization with coronary angiogram N/A 09/21/2012    Procedure: LEFT HEART CATHETERIZATION WITH CORONARY ANGIOGRAM;  Surgeon: Peter M Martinique, MD;  Location: [facility] CATH LAB;  Service: Cardiovascular;  Laterality: N/A;    Family History  Problem Relation Age of Onset  . Heart attack Brother     Died at 95  . Stroke Father     Died at 56  . Diabetes Sister   . Diabetes Mother   . Stomach cancer Brother   . Heart attack Sister     No Known Allergies  Current Outpatient Prescriptions on File Prior to Visit  Medication Sig Dispense Refill  . aspirin EC 81 MG tablet Take 81 mg by mouth daily.    . chlorpheniramine-HYDROcodone (TUSSIONEX PENNKINETIC ER) 10-8 MG/5ML LQCR Take 5 mLs by mouth every 12 (twelve) hours as needed. 115 mL 0  . [employer]S VITAMIN D 2000 UNITS CAPS Take 1  capsule by mouth daily.  11  . gabapentin (NEURONTIN) 300 MG capsule TAKE 1 CAPSULE (300 MG TOTAL) BY MOUTH AT BEDTIME. 30 capsule 3  . hydrALAZINE (APRESOLINE) 10 MG tablet TAKE 1 TABLET BY MOUTH EVERY 8 HOURS 90 tablet 2  . Insulin Detemir (LEVEMIR) 100 UNIT/ML Pen Inject 10 Units into the skin daily at 10 pm. 15 mL 5  . insulin lispro (HUMALOG) 100 UNIT/ML injection USE AS ADVISED - MAXIMUM 25 UNITS PER DAY. INJECT 7-7-5 UNITS PLUS SSI AS ADVISED. 10 mL 0  . Insulin Pen Needle (B-D ULTRAFINE III SHORT  PEN) 31G X 8 MM MISC USE AS DIRECTED WITH INSULIN Dx: 250.02 100 each 5  . levofloxacin (LEVAQUIN) 500 MG tablet Take 1 tablet (500 mg total) by mouth daily. 7 tablet 0  . lisinopril (PRINIVIL,ZESTRIL) 10 MG tablet TAKE 1 TABLET (10 MG TOTAL) BY MOUTH DAILY. 30 tablet 1  . NEXIUM 40 MG capsule TAKE 1 CAPSULE (40 MG TOTAL) BY MOUTH DAILY AT 12 NOON. 30 capsule 8  . nitroGLYCERIN (NITROSTAT) 0.4 MG SL tablet Place 1 tablet (0.4 mg total) under the tongue every 5 (five) minutes as needed for chest pain (up to 3 doses). 25 tablet 2  . pantoprazole (PROTONIX) 40 MG tablet Take 1 tablet (40 mg total) by mouth daily. 30 tablet 3  . polyvinyl alcohol (LIQUIFILM TEARS) 1.4 % ophthalmic solution Place 1 drop into both eyes daily as needed (dry eyes).    . potassium chloride SA (KLOR-CON M20) 20 MEQ tablet Take 2 tablets (40 mEq total) by mouth once. Take 2 pills a day 60 tablet 6  . rosuvastatin (CRESTOR) 40 MG tablet Take 1 tablet (40 mg total) by mouth daily. 30 tablet 2  . traMADol (ULTRAM) 50 MG tablet Take 1 tablet (50 mg total) by mouth every 8 (eight) hours as needed. 30 tablet 0   No current facility-administered medications on file prior to visit.    BP 130/70 mmHg  Pulse 107  Temp(Src) 97.4 F (36.3 C) (Oral)  Resp 18  Ht 5\' 7"  (1.702 m)  Wt 153 lb 9.6 oz (69.673 kg)  BMI 24.05 kg/m2  SpO2 99%       Objective:   Physical Exam  Constitutional: He is oriented to person, place, and  time. He appears well-developed and well-nourished. No distress.  HENT:  Head: Normocephalic and atraumatic.  Cardiovascular: Normal rate and regular rhythm.   No murmur heard. Pulmonary/Chest: Effort normal and breath sounds normal. No respiratory distress. He has no wheezes. He has no rales.  Musculoskeletal: He exhibits no edema.  Neurological: He is alert and oriented to person, place, and time.  Skin: Skin is warm and dry.  Psychiatric: He has a normal mood and affect. His behavior is normal. Thought content normal.          Assessment & Plan:

## 2014-06-23 NOTE — Telephone Encounter (Signed)
Cholesterol is uncontrolled, continue crestor 40 add zetia.  Sugar is uncontrolled.  Please schedule follow up with Dr. Cruzita Lederer.  Kidney function has worsened. Avoid NSAIDS,  Make sure to follow up as recommended with kidney doctor.

## 2014-06-23 NOTE — Patient Instructions (Signed)
Please contact Dr. Hal Neer [phone]  to arrange follow up and Dr. Cruzita Lederer.  [phone] Complete lab work prior to leaving. Please schedule a follow up appointment in 3 months.

## 2014-06-26 ENCOUNTER — Other Ambulatory Visit: Payer: Self-pay | Admitting: Family

## 2014-06-26 NOTE — Telephone Encounter (Signed)
Removed

## 2014-06-26 NOTE — Telephone Encounter (Signed)
[name]-- I noticed atorvastatin is still on pt's med list. Do we need to remove it since he is on Crestor?  Notified pt and he voices understanding. States he will contact Dr Cruzita Lederer for follow up of blood sugar. Currently seeing nephrologist and reports biopsy 2 weeks ago and will be seeing them again soon.

## 2014-06-27 NOTE — Assessment & Plan Note (Signed)
Advised pt to arrange follow up with Dr. Barbaraann Barthel.

## 2014-06-27 NOTE — Assessment & Plan Note (Signed)
Uncontrolled, advised pt to arrange follow up with Dr. Cruzita Lederer.   Lab Results  Component Value Date   HGBA1C 9.4* 06/23/2014

## 2014-06-27 NOTE — Assessment & Plan Note (Signed)
Lab Results  Component Value Date   CHOL 229* 06/23/2014   HDL 31.30* 06/23/2014   LDLCALC 124* 02/03/2014   LDLDIRECT 133.0 06/23/2014   TRIG 209.0* 06/23/2014   CHOLHDL 7 06/23/2014   Uncontrolled on crestor, add zetia. (I do question pt compliance)

## 2014-06-29 ENCOUNTER — Ambulatory Visit (HOSPITAL_BASED_OUTPATIENT_CLINIC_OR_DEPARTMENT_OTHER): Payer: Medicare Other

## 2014-06-29 ENCOUNTER — Encounter: Payer: Self-pay | Admitting: Hematology & Oncology

## 2014-06-29 ENCOUNTER — Ambulatory Visit (HOSPITAL_BASED_OUTPATIENT_CLINIC_OR_DEPARTMENT_OTHER): Payer: Medicare Other | Admitting: Hematology & Oncology

## 2014-06-29 ENCOUNTER — Other Ambulatory Visit (HOSPITAL_BASED_OUTPATIENT_CLINIC_OR_DEPARTMENT_OTHER): Payer: Medicare Other

## 2014-06-29 VITALS — BP 155/78 | HR 85

## 2014-06-29 VITALS — BP 131/73 | HR 108 | Temp 97.5°F | Resp 18 | Ht 67.0 in | Wt 155.0 lb

## 2014-06-29 DIAGNOSIS — N289 Disorder of kidney and ureter, unspecified: Secondary | ICD-10-CM

## 2014-06-29 DIAGNOSIS — D509 Iron deficiency anemia, unspecified: Secondary | ICD-10-CM

## 2014-06-29 DIAGNOSIS — D631 Anemia in chronic kidney disease: Secondary | ICD-10-CM

## 2014-06-29 DIAGNOSIS — Z8546 Personal history of malignant neoplasm of prostate: Secondary | ICD-10-CM | POA: Diagnosis not present

## 2014-06-29 DIAGNOSIS — D500 Iron deficiency anemia secondary to blood loss (chronic): Secondary | ICD-10-CM

## 2014-06-29 DIAGNOSIS — K254 Chronic or unspecified gastric ulcer with hemorrhage: Secondary | ICD-10-CM

## 2014-06-29 DIAGNOSIS — N189 Chronic kidney disease, unspecified: Principal | ICD-10-CM

## 2014-06-29 DIAGNOSIS — D649 Anemia, unspecified: Secondary | ICD-10-CM | POA: Diagnosis not present

## 2014-06-29 LAB — CBC WITH DIFFERENTIAL (CANCER CENTER ONLY)
BASO#: 0.1 10*3/uL (ref 0.0–0.2)
BASO%: 0.8 % (ref 0.0–2.0)
EOS ABS: 0.2 10*3/uL (ref 0.0–0.5)
EOS%: 2.9 % (ref 0.0–7.0)
HCT: 23.6 % — ABNORMAL LOW (ref 38.7–49.9)
HEMOGLOBIN: 7.8 g/dL — AB (ref 13.0–17.1)
LYMPH#: 1.4 10*3/uL (ref 0.9–3.3)
LYMPH%: 23 % (ref 14.0–48.0)
MCH: 28.8 pg (ref 28.0–33.4)
MCHC: 33.1 g/dL (ref 32.0–35.9)
MCV: 87 fL (ref 82–98)
MONO#: 0.8 10*3/uL (ref 0.1–0.9)
MONO%: 13.2 % — ABNORMAL HIGH (ref 0.0–13.0)
NEUT%: 60.1 % (ref 40.0–80.0)
NEUTROS ABS: 3.6 10*3/uL (ref 1.5–6.5)
PLATELETS: 424 10*3/uL — AB (ref 145–400)
RBC: 2.71 10*6/uL — AB (ref 4.20–5.70)
RDW: 14.5 % (ref 11.1–15.7)
WBC: 5.9 10*3/uL (ref 4.0–10.0)

## 2014-06-29 LAB — CMP (CANCER CENTER ONLY)
ALT: 16 U/L (ref 10–47)
AST: 19 U/L (ref 11–38)
Albumin: 2.2 g/dL — ABNORMAL LOW (ref 3.3–5.5)
Alkaline Phosphatase: 106 U/L — ABNORMAL HIGH (ref 26–84)
BUN: 18 mg/dL (ref 7–22)
CHLORIDE: 105 meq/L (ref 98–108)
CO2: 29 mEq/L (ref 18–33)
Calcium: 8.8 mg/dL (ref 8.0–10.3)
Creat: 1.9 mg/dl — ABNORMAL HIGH (ref 0.6–1.2)
Glucose, Bld: 185 mg/dL — ABNORMAL HIGH (ref 73–118)
Potassium: 4.1 mEq/L (ref 3.3–4.7)
Sodium: 143 mEq/L (ref 128–145)
TOTAL PROTEIN: 6.6 g/dL (ref 6.4–8.1)
Total Bilirubin: 0.5 mg/dl (ref 0.20–1.60)

## 2014-06-29 LAB — IRON AND TIBC
%SAT: 8 % — ABNORMAL LOW (ref 20–55)
IRON: 21 ug/dL — AB (ref 42–165)
TIBC: 269 ug/dL (ref 215–435)
UIBC: 248 ug/dL (ref 125–400)

## 2014-06-29 LAB — RETICULOCYTES (CHCC)
ABS Retic: 67.4 10*3/uL (ref 19.0–186.0)
RBC.: 2.81 MIL/uL — AB (ref 4.22–5.81)
Retic Ct Pct: 2.4 % — ABNORMAL HIGH (ref 0.4–2.3)

## 2014-06-29 LAB — FERRITIN: Ferritin: 21 ng/mL — ABNORMAL LOW (ref 22–322)

## 2014-06-29 MED ORDER — DARBEPOETIN ALFA 300 MCG/0.6ML IJ SOSY
PREFILLED_SYRINGE | INTRAMUSCULAR | Status: AC
  Filled 2014-06-29: qty 0.6

## 2014-06-29 MED ORDER — DARBEPOETIN ALFA 300 MCG/0.6ML IJ SOSY
300.0000 ug | PREFILLED_SYRINGE | Freq: Once | INTRAMUSCULAR | Status: AC
  Administered 2014-06-29: 300 ug via SUBCUTANEOUS

## 2014-06-29 MED ORDER — SODIUM CHLORIDE 0.9 % IV SOLN
510.0000 mg | Freq: Once | INTRAVENOUS | Status: AC
  Administered 2014-06-29: 510 mg via INTRAVENOUS
  Filled 2014-06-29: qty 17

## 2014-06-29 NOTE — Patient Instructions (Signed)
Darbepoetin Alfa injection What is this medicine? DARBEPOETIN ALFA (dar be POE e tin AL fa) helps your body make more red blood cells. It is used to treat anemia caused by chronic kidney failure and chemotherapy. This medicine may be used for other purposes; ask your health care provider or pharmacist if you have questions. COMMON BRAND NAME(S): Aranesp What should I tell my health care provider before I take this medicine? They need to know if you have any of these conditions: -blood clotting disorders or history of blood clots -cancer patient not on chemotherapy -cystic fibrosis -heart disease, such as angina, heart failure, or a history of a heart attack -hemoglobin level of 12 g/dL or greater -high blood pressure -low levels of folate, iron, or vitamin B12 -seizures -an unusual or allergic reaction to darbepoetin, erythropoietin, albumin, hamster proteins, latex, other medicines, foods, dyes, or preservatives -pregnant or trying to get pregnant -breast-feeding How should I use this medicine? This medicine is for injection into a vein or under the skin. It is usually given by a health care professional in a hospital or clinic setting. If you get this medicine at home, you will be taught how to prepare and give this medicine. Do not shake the solution before you withdraw a dose. Use exactly as directed. Take your medicine at regular intervals. Do not take your medicine more often than directed. It is important that you put your used needles and syringes in a special sharps container. Do not put them in a trash can. If you do not have a sharps container, call your pharmacist or healthcare provider to get one. Talk to your pediatrician regarding the use of this medicine in children. While this medicine may be used in children as young as 1 year for selected conditions, precautions do apply. Overdosage: If you think you have taken too much of this medicine contact a poison control center or  emergency room at once. NOTE: This medicine is only for you. Do not share this medicine with others. What if I miss a dose? If you miss a dose, take it as soon as you can. If it is almost time for your next dose, take only that dose. Do not take double or extra doses. What may interact with this medicine? Do not take this medicine with any of the following medications: -epoetin alfa This list may not describe all possible interactions. Give your health care provider a list of all the medicines, herbs, non-prescription drugs, or dietary supplements you use. Also tell them if you smoke, drink alcohol, or use illegal drugs. Some items may interact with your medicine. What should I watch for while using this medicine? Visit your prescriber or health care professional for regular checks on your progress and for the needed blood tests and blood pressure measurements. It is especially important for the doctor to make sure your hemoglobin level is in the desired range, to limit the risk of potential side effects and to give you the best benefit. Keep all appointments for any recommended tests. Check your blood pressure as directed. Ask your doctor what your blood pressure should be and when you should contact him or her. As your body makes more red blood cells, you may need to take iron, folic acid, or vitamin B supplements. Ask your doctor or health care provider which products are right for you. If you have kidney disease continue dietary restrictions, even though this medication can make you feel better. Talk with your doctor or health   care professional about the foods you eat and the vitamins that you take. What side effects may I notice from receiving this medicine? Side effects that you should report to your doctor or health care professional as soon as possible: -allergic reactions like skin rash, itching or hives, swelling of the face, lips, or tongue -breathing problems -changes in vision -chest  pain -confusion, trouble speaking or understanding -feeling faint or lightheaded, falls -high blood pressure -muscle aches or pains -pain, swelling, warmth in the leg -rapid weight gain -severe headaches -sudden numbness or weakness of the face, arm or leg -trouble walking, dizziness, loss of balance or coordination -seizures (convulsions) -swelling of the ankles, feet, hands -unusually weak or tired Side effects that usually do not require medical attention (report to your doctor or health care professional if they continue or are bothersome): -diarrhea -fever, chills (flu-like symptoms) -headaches -nausea, vomiting -redness, stinging, or swelling at site where injected This list may not describe all possible side effects. Call your doctor for medical advice about side effects. You may report side effects to FDA at 1-800-FDA-1088. Where should I keep my medicine? Keep out of the reach of children. Store in a refrigerator between 2 and 8 degrees C (36 and 46 degrees F). Do not freeze. Do not shake. Throw away any unused portion if using a single-dose vial. Throw away any unused medicine after the expiration date. NOTE: This sheet is a summary. It may not cover all possible information. If you have questions about this medicine, talk to your doctor, pharmacist, or health care provider.  2015, Elsevier/Gold Standard. (2008-02-15 10:23:57)  Ferumoxytol injection What is this medicine? FERUMOXYTOL is an iron complex. Iron is used to make healthy red blood cells, which carry oxygen and nutrients throughout the body. This medicine is used to treat iron deficiency anemia in people with chronic kidney disease. This medicine may be used for other purposes; ask your health care provider or pharmacist if you have questions. COMMON BRAND NAME(S): Feraheme What should I tell my health care provider before I take this medicine? They need to know if you have any of these conditions: -anemia not  caused by low iron levels -high levels of iron in the blood -magnetic resonance imaging (MRI) test scheduled -an unusual or allergic reaction to iron, other medicines, foods, dyes, or preservatives -pregnant or trying to get pregnant -breast-feeding How should I use this medicine? This medicine is for injection into a vein. It is given by a health care professional in a hospital or clinic setting. Talk to your pediatrician regarding the use of this medicine in children. Special care may be needed. Overdosage: If you think you've taken too much of this medicine contact a poison control center or emergency room at once. Overdosage: If you think you have taken too much of this medicine contact a poison control center or emergency room at once. NOTE: This medicine is only for you. Do not share this medicine with others. What if I miss a dose? It is important not to miss your dose. Call your doctor or health care professional if you are unable to keep an appointment. What may interact with this medicine? This medicine may interact with the following medications: -other iron products This list may not describe all possible interactions. Give your health care provider a list of all the medicines, herbs, non-prescription drugs, or dietary supplements you use. Also tell them if you smoke, drink alcohol, or use illegal drugs. Some items may interact with your   medicine. What should I watch for while using this medicine? Visit your doctor or healthcare professional regularly. Tell your doctor or healthcare professional if your symptoms do not start to get better or if they get worse. You may need blood work done while you are taking this medicine. You may need to follow a special diet. Talk to your doctor. Foods that contain iron include: whole grains/cereals, dried fruits, beans, or peas, leafy green vegetables, and organ meats (liver, kidney). What side effects may I notice from receiving this  medicine? Side effects that you should report to your doctor or health care professional as soon as possible: -allergic reactions like skin rash, itching or hives, swelling of the face, lips, or tongue -breathing problems -changes in blood pressure -feeling faint or lightheaded, falls -fever or chills -flushing, sweating, or hot feelings -swelling of the ankles or feet Side effects that usually do not require medical attention (Report these to your doctor or health care professional if they continue or are bothersome.): -diarrhea -headache -nausea, vomiting -stomach pain This list may not describe all possible side effects. Call your doctor for medical advice about side effects. You may report side effects to FDA at 1-800-FDA-1088. Where should I keep my medicine? This drug is given in a hospital or clinic and will not be stored at home. NOTE: This sheet is a summary. It may not cover all possible information. If you have questions about this medicine, talk to your doctor, pharmacist, or health care provider.  2015, Elsevier/Gold Standard. (2011-10-17 15:23:36)  

## 2014-06-29 NOTE — Progress Notes (Signed)
Hematology and Oncology Follow Up Visit  [name] [id number] 28-Aug-1945 69 y.o. 06/29/2014   Principle Diagnosis:  Recurrent iron-deficiency anemia secondary to gastrointestinal     bleeding. 2. Anemia of renal insufficiency secondary to poorly controlled     diabetes. 3. History of stage I prostate cancer.  Current Therapy:   1. Aranesp 300 mcg subcu as needed for hemoglobin less than 10. 2. IV iron as indicated.     Interim History:  Mr.  [name] is back for followup. He feels okay. He does feel tired. He says that his right leg is numb. I spent this might be from some nerve issue. He has had back surgery. He says he likes to ride horses. I'm sure that there probably is something with his horse riding and the posture that he sits on the horse with that might be causing some nerve compression. He sees his family doctor for this.  He had a kidney biopsy a couple of weeks ago. He does not other results. 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  He's not noted any obvious melena. He's had no bleeding. He's had no cough or shortness of breath. He is still smoking.  He does have some tingling in his hands and feet. I suspect that this likely is diabetic neuropathy.  Medications:  Current outpatient prescriptions:  .  aspirin EC 81 MG tablet, Take 81 mg by mouth daily., Disp: , Rfl:  .  CVS VITAMIN D 2000 UNITS CAPS, Take 1 capsule by mouth daily., Disp: , Rfl: 11 .  ezetimibe (ZETIA) 10 MG tablet, Take 1 tablet (10 mg total) by mouth daily., Disp: 30 tablet, Rfl: 3 .  gabapentin (NEURONTIN) 300 MG capsule, TAKE 1 CAPSULE (300 MG TOTAL) BY MOUTH AT BEDTIME., Disp: 30 capsule, Rfl: 3 .  hydrALAZINE (APRESOLINE) 10 MG tablet, TAKE 1 TABLET BY MOUTH EVERY 8 HOURS, Disp: 90 tablet, Rfl: 2 .  Insulin Detemir (LEVEMIR) 100 UNIT/ML Pen, Inject 10 Units into the skin daily at 10 pm., Disp: 15 mL, Rfl: 5 .  insulin lispro (HUMALOG) 100 UNIT/ML injection,  USE AS ADVISED - MAXIMUM 25 UNITS PER DAY. INJECT 7-7-5 UNITS PLUS SSI AS ADVISED., Disp: 10 mL, Rfl: 0 .  Insulin Pen Needle (B-D ULTRAFINE III SHORT PEN) 31G X 8 MM MISC, USE AS DIRECTED WITH INSULIN Dx: 250.02, Disp: 100 each, Rfl: 5 .  lisinopril (PRINIVIL,ZESTRIL) 10 MG tablet, TAKE 1 TABLET (10 MG TOTAL) BY MOUTH DAILY., Disp: 30 tablet, Rfl: 1 .  NEXIUM 40 MG capsule, TAKE 1 CAPSULE (40 MG TOTAL) BY MOUTH DAILY AT 12 NOON., Disp: 30 capsule, Rfl: 8 .  nitroGLYCERIN (NITROSTAT) 0.4 MG SL tablet, Place 1 tablet (0.4 mg total) under the tongue every 5 (five) minutes as needed for chest pain (up to 3 doses)., Disp: 25 tablet, Rfl: 2 .  polyvinyl alcohol (LIQUIFILM TEARS) 1.4 % ophthalmic solution, Place 1 drop into both eyes daily as needed (dry eyes)., Disp: , Rfl:  .  potassium chloride SA (KLOR-CON M20) 20 MEQ tablet, Take 2 tablets (40 mEq total) by mouth once. Take 2 pills a day, Disp: 60 tablet, Rfl: 6 .  rosuvastatin (CRESTOR) 40 MG tablet, Take 1 tablet (40 mg total) by mouth daily., Disp: 30 tablet, Rfl: 2 .  chlorpheniramine-HYDROcodone (TUSSIONEX PENNKINETIC ER) 10-8 MG/5ML LQCR, Take 5 mLs by mouth every 12 (twelve) hours as needed. (Patient not taking: Reported on 06/29/2014), Disp: 115 mL, Rfl:  0 .  levofloxacin (LEVAQUIN) 500 MG tablet, Take 1 tablet (500 mg total) by mouth daily. (Patient not taking: Reported on 06/29/2014), Disp: 7 tablet, Rfl: 0 .  pantoprazole (PROTONIX) 40 MG tablet, Take 1 tablet (40 mg total) by mouth daily. (Patient not taking: Reported on 06/29/2014), Disp: 30 tablet, Rfl: 3 .  traMADol (ULTRAM) 50 MG tablet, Take 1 tablet (50 mg total) by mouth every 8 (eight) hours as needed., Disp: 30 tablet, Rfl: 0  Allergies: No Known Allergies  Past Medical History, Surgical history, Social history, and Family History were reviewed and updated.  Review of Systems: As above  Physical Exam:  height is 5\' 7"  (1.702 m) and weight is 155 lb (70.308 kg). His oral  temperature is 97.5 F (36.4 C). His blood pressure is 131/73 and his pulse is 108. His respiration is 18.   Well-developed and well-nourished African American done. Head and neck exam shows no ocular or oral lesions. He has no palpable cervical or supraclavicular lymph nodes. Lungs are clear. Cardiac exam regular rate and rhythm with no murmurs rubs or bruits. Abdomen is soft. He has good bowel sounds. There is no fluid wave. There is a palpable liver or spleen tip. Back exam shows no tenderness over the spine ribs or hips. Extremities shows no clubbing, cyanosis or edema. Has good range of motion of his joints. Has good strength in his legs. He has intact pulses in his distal fingers. Skin exam slightly dry. Neurological exam shows no focal neurological deficits.  Lab Results  Component Value Date   WBC 5.9 06/29/2014   HGB 7.8* 06/29/2014   HCT 23.6* 06/29/2014   MCV 87 06/29/2014   PLT 424* 06/29/2014     Chemistry      Component Value Date/Time   NA 143 06/29/2014 0811   NA 136 06/23/2014 1218   K 4.1 06/29/2014 0811   K 4.5 06/23/2014 1218   CL 105 06/29/2014 0811   CL 105 06/23/2014 1218   CO2 29 06/29/2014 0811   CO2 26 06/23/2014 1218   BUN 18 06/29/2014 0811   BUN 20 06/23/2014 1218   CREATININE 1.9* 06/29/2014 0811   CREATININE 1.82* 06/23/2014 1218      Component Value Date/Time   CALCIUM 8.8 06/29/2014 0811   CALCIUM 8.6 06/23/2014 1218   ALKPHOS 106* 06/29/2014 0811   ALKPHOS 112 03/30/2014 0832   AST 19 06/29/2014 0811   AST 9 03/30/2014 0832   ALT 16 06/29/2014 0811   ALT 8 03/30/2014 0832   BILITOT 0.50 06/29/2014 0811   BILITOT 0.3 03/30/2014 0832         Impression and Plan: [name] is 69 year old gentleman with iron deficiency anemia. He has recurrent GI bleeding. Again, I suspect that his iron levels are getting lower again. We'll go ahead and give him IV iron. His blood sugars, as always, are quite high. It's hard to really get these under good  control. He is doing better.  From my point of view, I think we have to to do some Aranesp.  I will go ahead and schedule him back in another one month    Volanda Napoleon, MD 4/14/201610:35 AM

## 2014-07-11 ENCOUNTER — Other Ambulatory Visit: Payer: Self-pay | Admitting: Family

## 2014-07-11 NOTE — Telephone Encounter (Signed)
Received refill request from pharmacy for atorvastatin. Per our records, pt should be taking Crestor and refills were sent on 04/20/14, #30 x 2 refills. Sent denial to pharmacy for atorvastatin with note to determine if pt needs refills of Crestor instead?[name]

## 2014-07-18 ENCOUNTER — Other Ambulatory Visit: Payer: Self-pay | Admitting: Family

## 2014-07-18 NOTE — Telephone Encounter (Signed)
Atorvastatin request denied as pt has previously told me that he is taking Crestor. Spoke with pharmacy. She states crestor is non-formulary with pt's insurance. Atorvastatin or lovastatin are preferred.  Please advise.

## 2014-07-19 ENCOUNTER — Encounter: Payer: Self-pay | Admitting: *Deleted

## 2014-07-19 MED ORDER — ATORVASTATIN CALCIUM 80 MG PO TABS: 80.0000 mg | ORAL_TABLET | Freq: Every day | ORAL | Status: DC

## 2014-07-19 NOTE — Telephone Encounter (Signed)
Please send atorvastatin 80mg  refill instead and notify pt.

## 2014-07-19 NOTE — Telephone Encounter (Signed)
Left message for pt to return my call.

## 2014-07-19 NOTE — Progress Notes (Signed)
[facility] contacted the office stating that patient had been treated for anemia from both offices. He had been receiving both epogen and aranesp. Due to unnecessary overlapping care, [facility] will continue to treat patient's renal diagnosis, but refer to our office for treatment of anemia. Dr Marin Olp notified and agreeable to plan.

## 2014-07-19 NOTE — Telephone Encounter (Signed)
Notified pt. Rx for atorvastatin sent to pharmacy.

## 2014-07-20 ENCOUNTER — Other Ambulatory Visit: Payer: Medicare Other

## 2014-07-20 ENCOUNTER — Ambulatory Visit: Payer: Medicare Other | Admitting: Family

## 2014-07-20 ENCOUNTER — Ambulatory Visit: Payer: Medicare Other

## 2014-07-21 ENCOUNTER — Telehealth: Payer: Self-pay | Admitting: Family

## 2014-07-21 NOTE — Telephone Encounter (Signed)
Spoke with [place] at pharmacy. Crestor Rx non-formulary. They say they did not receive atorvastatin Rx from 07/19/14. Verbal given to Roseanna per 07/19/14 Rx. Notified pt.

## 2014-07-21 NOTE — Telephone Encounter (Signed)
Relation to pt: self Call back number: [phone]   Reason for call:  Pt would like to discuss cholesterol medication (pt does not know the name of RX) pt insurance will not cover its to expensive

## 2014-07-27 ENCOUNTER — Other Ambulatory Visit (HOSPITAL_BASED_OUTPATIENT_CLINIC_OR_DEPARTMENT_OTHER): Payer: Medicare Other

## 2014-07-27 ENCOUNTER — Encounter: Payer: Self-pay | Admitting: Family

## 2014-07-27 ENCOUNTER — Ambulatory Visit (HOSPITAL_BASED_OUTPATIENT_CLINIC_OR_DEPARTMENT_OTHER): Payer: Medicare Other

## 2014-07-27 ENCOUNTER — Ambulatory Visit (HOSPITAL_BASED_OUTPATIENT_CLINIC_OR_DEPARTMENT_OTHER): Payer: Medicare Other | Admitting: Family

## 2014-07-27 VITALS — BP 129/69 | HR 109 | Temp 98.0°F | Resp 20 | Ht 67.0 in | Wt 152.0 lb

## 2014-07-27 DIAGNOSIS — N183 Chronic kidney disease, stage 3 (moderate): Secondary | ICD-10-CM

## 2014-07-27 DIAGNOSIS — N189 Chronic kidney disease, unspecified: Principal | ICD-10-CM

## 2014-07-27 DIAGNOSIS — D500 Iron deficiency anemia secondary to blood loss (chronic): Secondary | ICD-10-CM

## 2014-07-27 DIAGNOSIS — D631 Anemia in chronic kidney disease: Secondary | ICD-10-CM

## 2014-07-27 DIAGNOSIS — E119 Type 2 diabetes mellitus without complications: Secondary | ICD-10-CM

## 2014-07-27 DIAGNOSIS — D509 Iron deficiency anemia, unspecified: Secondary | ICD-10-CM

## 2014-07-27 DIAGNOSIS — K922 Gastrointestinal hemorrhage, unspecified: Secondary | ICD-10-CM

## 2014-07-27 DIAGNOSIS — K254 Chronic or unspecified gastric ulcer with hemorrhage: Secondary | ICD-10-CM

## 2014-07-27 DIAGNOSIS — Z720 Tobacco use: Secondary | ICD-10-CM | POA: Diagnosis not present

## 2014-07-27 LAB — CMP (CANCER CENTER ONLY)
ALBUMIN: 2.3 g/dL — AB (ref 3.3–5.5)
ALT(SGPT): 16 U/L (ref 10–47)
AST: 16 U/L (ref 11–38)
Alkaline Phosphatase: 117 U/L — ABNORMAL HIGH (ref 26–84)
BUN, Bld: 18 mg/dL (ref 7–22)
CALCIUM: 9.2 mg/dL (ref 8.0–10.3)
CHLORIDE: 106 meq/L (ref 98–108)
CO2: 26 meq/L (ref 18–33)
Creat: 2.1 mg/dl — ABNORMAL HIGH (ref 0.6–1.2)
Glucose, Bld: 259 mg/dL — ABNORMAL HIGH (ref 73–118)
Potassium: 4 mEq/L (ref 3.3–4.7)
SODIUM: 139 meq/L (ref 128–145)
TOTAL PROTEIN: 7 g/dL (ref 6.4–8.1)
Total Bilirubin: 0.6 mg/dl (ref 0.20–1.60)

## 2014-07-27 LAB — CBC WITH DIFFERENTIAL (CANCER CENTER ONLY)
BASO#: 0 10*3/uL (ref 0.0–0.2)
BASO%: 0.7 % (ref 0.0–2.0)
EOS ABS: 0.1 10*3/uL (ref 0.0–0.5)
EOS%: 2.1 % (ref 0.0–7.0)
HCT: 29.5 % — ABNORMAL LOW (ref 38.7–49.9)
HGB: 9.8 g/dL — ABNORMAL LOW (ref 13.0–17.1)
LYMPH#: 1.1 10*3/uL (ref 0.9–3.3)
LYMPH%: 18 % (ref 14.0–48.0)
MCH: 29 pg (ref 28.0–33.4)
MCHC: 33.2 g/dL (ref 32.0–35.9)
MCV: 87 fL (ref 82–98)
MONO#: 0.9 10*3/uL (ref 0.1–0.9)
MONO%: 14.7 % — AB (ref 0.0–13.0)
NEUT%: 64.5 % (ref 40.0–80.0)
NEUTROS ABS: 3.9 10*3/uL (ref 1.5–6.5)
Platelets: 339 10*3/uL (ref 145–400)
RBC: 3.38 10*6/uL — ABNORMAL LOW (ref 4.20–5.70)
RDW: 15.1 % (ref 11.1–15.7)
WBC: 6.1 10*3/uL (ref 4.0–10.0)

## 2014-07-27 LAB — IRON AND TIBC CHCC
%SAT: 17 % — AB (ref 20–55)
IRON: 39 ug/dL — AB (ref 42–163)
TIBC: 235 ug/dL (ref 202–409)
UIBC: 196 ug/dL (ref 117–376)

## 2014-07-27 LAB — RETICULOCYTES (CHCC)
ABS Retic: 81.8 10*3/uL (ref 19.0–186.0)
RBC.: 3.41 MIL/uL — ABNORMAL LOW (ref 4.22–5.81)
Retic Ct Pct: 2.4 % — ABNORMAL HIGH (ref 0.4–2.3)

## 2014-07-27 LAB — FERRITIN CHCC: FERRITIN: 51 ng/mL (ref 22–316)

## 2014-07-27 MED ORDER — SODIUM CHLORIDE 0.9 % IJ SOLN
3.0000 mL | Freq: Once | INTRAMUSCULAR | Status: DC | PRN
  Filled 2014-07-27: qty 10

## 2014-07-27 MED ORDER — DARBEPOETIN ALFA 300 MCG/0.6ML IJ SOSY
300.0000 ug | PREFILLED_SYRINGE | Freq: Once | INTRAMUSCULAR | Status: AC
  Administered 2014-07-27: 300 ug via SUBCUTANEOUS

## 2014-07-27 MED ORDER — DARBEPOETIN ALFA 300 MCG/0.6ML IJ SOSY
PREFILLED_SYRINGE | INTRAMUSCULAR | Status: AC
  Filled 2014-07-27: qty 0.6

## 2014-07-27 NOTE — Patient Instructions (Signed)
Darbepoetin Alfa injection What is this medicine? DARBEPOETIN ALFA (dar be POE e tin AL fa) helps your body make more red blood cells. It is used to treat anemia caused by chronic kidney failure and chemotherapy. This medicine may be used for other purposes; ask your health care provider or pharmacist if you have questions. COMMON BRAND NAME(S): Aranesp What should I tell my health care provider before I take this medicine? They need to know if you have any of these conditions: -blood clotting disorders or history of blood clots -cancer patient not on chemotherapy -cystic fibrosis -heart disease, such as angina, heart failure, or a history of a heart attack -hemoglobin level of 12 g/dL or greater -high blood pressure -low levels of folate, iron, or vitamin B12 -seizures -an unusual or allergic reaction to darbepoetin, erythropoietin, albumin, hamster proteins, latex, other medicines, foods, dyes, or preservatives -pregnant or trying to get pregnant -breast-feeding How should I use this medicine? This medicine is for injection into a vein or under the skin. It is usually given by a health care professional in a hospital or clinic setting. If you get this medicine at home, you will be taught how to prepare and give this medicine. Do not shake the solution before you withdraw a dose. Use exactly as directed. Take your medicine at regular intervals. Do not take your medicine more often than directed. It is important that you put your used needles and syringes in a special sharps container. Do not put them in a trash can. If you do not have a sharps container, call your pharmacist or healthcare provider to get one. Talk to your pediatrician regarding the use of this medicine in children. While this medicine may be used in children as young as 1 year for selected conditions, precautions do apply. Overdosage: If you think you have taken too much of this medicine contact a poison control center or  emergency room at once. NOTE: This medicine is only for you. Do not share this medicine with others. What if I miss a dose? If you miss a dose, take it as soon as you can. If it is almost time for your next dose, take only that dose. Do not take double or extra doses. What may interact with this medicine? Do not take this medicine with any of the following medications: -epoetin alfa This list may not describe all possible interactions. Give your health care provider a list of all the medicines, herbs, non-prescription drugs, or dietary supplements you use. Also tell them if you smoke, drink alcohol, or use illegal drugs. Some items may interact with your medicine. What should I watch for while using this medicine? Visit your prescriber or health care professional for regular checks on your progress and for the needed blood tests and blood pressure measurements. It is especially important for the doctor to make sure your hemoglobin level is in the desired range, to limit the risk of potential side effects and to give you the best benefit. Keep all appointments for any recommended tests. Check your blood pressure as directed. Ask your doctor what your blood pressure should be and when you should contact him or her. As your body makes more red blood cells, you may need to take iron, folic acid, or vitamin B supplements. Ask your doctor or health care provider which products are right for you. If you have kidney disease continue dietary restrictions, even though this medication can make you feel better. Talk with your doctor or health   care professional about the foods you eat and the vitamins that you take. What side effects may I notice from receiving this medicine? Side effects that you should report to your doctor or health care professional as soon as possible: -allergic reactions like skin rash, itching or hives, swelling of the face, lips, or tongue -breathing problems -changes in vision -chest  pain -confusion, trouble speaking or understanding -feeling faint or lightheaded, falls -high blood pressure -muscle aches or pains -pain, swelling, warmth in the leg -rapid weight gain -severe headaches -sudden numbness or weakness of the face, arm or leg -trouble walking, dizziness, loss of balance or coordination -seizures (convulsions) -swelling of the ankles, feet, hands -unusually weak or tired Side effects that usually do not require medical attention (report to your doctor or health care professional if they continue or are bothersome): -diarrhea -fever, chills (flu-like symptoms) -headaches -nausea, vomiting -redness, stinging, or swelling at site where injected This list may not describe all possible side effects. Call your doctor for medical advice about side effects. You may report side effects to FDA at 1-800-FDA-1088. Where should I keep my medicine? Keep out of the reach of children. Store in a refrigerator between 2 and 8 degrees C (36 and 46 degrees F). Do not freeze. Do not shake. Throw away any unused portion if using a single-dose vial. Throw away any unused medicine after the expiration date. NOTE: This sheet is a summary. It may not cover all possible information. If you have questions about this medicine, talk to your doctor, pharmacist, or health care provider.  2015, [employer]. (2008-02-15 10:23:57)  

## 2014-07-27 NOTE — Progress Notes (Signed)
Hematology and Oncology Follow Up Visit  [name] [id number] [birth year] 69 y.o. 07/27/2014   Principle Diagnosis:  1. Iron-deficiency anemia secondary to recurrent gastrointestinal  bleeding. 2. 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  Current Therapy:   1. Aranesp 300 mcg subcu as needed for hemoglobin less than 10 2. IV iron as indicated    Interim History:  [name] is here today for a follow-up. He responded nicely to the Aranesp and Feraheme he received during his last visit. His Hgb is up to 9.8 today. He is still having some mild fatigue.  Unfortunately, his blood sugars are still not well controlled. His glucose this morning is 259 and creatinine is up at 2.1. We will give him a dose of Aranesp today.  His ferritin in April was 21 and iron saturation was 8%. He has had no blood in his stool.  He denies fever, chills, n/v, cough, rash, dizziness, blurred vision, chest pain, palpitations, abdominal pain, constipation, diarrhea, blood in urine.  He has some SOB at times with exertion. He is still smoking.   He has some chronic leg swelling that comes and goes. This is alleviated with elevations. No new aches or pains.  His appetite is good and he is staying hydrated. His weight is stables.  He has dry eyes his right eye being worse than the left. He is going downstairs after he leaves [place] to see if he can see [name].   Medications:    Medication List       This list is accurate as of: 07/27/14  8:42 AM.  Always use your most recent med list.               aspirin EC 81 MG tablet  Take 81 mg by mouth daily.     atorvastatin 80 MG tablet  Commonly known as:  LIPITOR  Take 1 tablet (80 mg total) by mouth daily.     chlorpheniramine-HYDROcodone 10-8 MG/5ML Lqcr  Commonly known as:  TUSSIONEX PENNKINETIC ER  Take 5 mLs by mouth every 12 (twelve) hours as needed.     [employer]S VITAMIN D 2000 UNITS Caps  Generic drug:   Cholecalciferol  Take 1 capsule by mouth daily.     ezetimibe 10 MG tablet  Commonly known as:  ZETIA  Take 1 tablet (10 mg total) by mouth daily.     gabapentin 300 MG capsule  Commonly known as:  NEURONTIN  TAKE 1 CAPSULE (300 MG TOTAL) BY MOUTH AT BEDTIME.     hydrALAZINE 10 MG tablet  Commonly known as:  APRESOLINE  TAKE 1 TABLET BY MOUTH EVERY 8 HOURS     Insulin Detemir 100 UNIT/ML Pen  Commonly known as:  LEVEMIR  Inject 10 Units into the skin daily at 10 pm.     insulin lispro 100 UNIT/ML injection  Commonly known as:  HUMALOG  USE AS ADVISED - MAXIMUM 25 UNITS PER DAY. INJECT 7-7-5 UNITS PLUS SSI AS ADVISED.     Insulin Pen Needle 31G X 8 MM Misc  Commonly known as:  B-D ULTRAFINE III SHORT PEN  USE AS DIRECTED WITH INSULIN Dx: 250.02     levofloxacin 500 MG tablet  Commonly known as:  LEVAQUIN  Take 1 tablet (500 mg total) by mouth daily.     lisinopril 10 MG tablet  Commonly known as:  PRINIVIL,ZESTRIL  TAKE 1 TABLET (10 MG TOTAL) BY MOUTH DAILY.     NEXIUM  40 MG capsule  Generic drug:  esomeprazole  TAKE 1 CAPSULE (40 MG TOTAL) BY MOUTH DAILY AT 12 NOON.     nitroGLYCERIN 0.4 MG SL tablet  Commonly known as:  NITROSTAT  Place 1 tablet (0.4 mg total) under the tongue every 5 (five) minutes as needed for chest pain (up to 3 doses).     pantoprazole 40 MG tablet  Commonly known as:  PROTONIX  Take 1 tablet (40 mg total) by mouth daily.     polyvinyl alcohol 1.4 % ophthalmic solution  Commonly known as:  LIQUIFILM TEARS  Place 1 drop into both eyes daily as needed (dry eyes).     potassium chloride SA 20 MEQ tablet  Commonly known as:  KLOR-CON M20  Take 2 tablets (40 mEq total) by mouth once. Take 2 pills a day     traMADol 50 MG tablet  Commonly known as:  ULTRAM  Take 1 tablet (50 mg total) by mouth every 8 (eight) hours as needed.        Allergies: No Known Allergies  Past Medical History, Surgical history, Social history, and Family History  were reviewed and updated.  Review of Systems: All other 10 point review of systems is negative.   Physical Exam:  vitals were not taken for this visit.  Wt Readings from Last 3 Encounters:  06/29/14 155 lb (70.308 kg)  06/23/14 153 lb 9.6 oz (69.673 kg)  03/30/14 154 lb (69.854 kg)    Ocular: Sclerae unicteric, pupils equal, round and reactive to light Ear-nose-throat: Oropharynx clear, dentition fair Lymphatic: No cervical or supraclavicular adenopathy Lungs no rales or rhonchi, good excursion bilaterally Heart regular rate and rhythm, no murmur appreciated Abd soft, nontender, positive bowel sounds MSK no focal spinal tenderness, no joint edema Neuro: non-focal, well-oriented, appropriate affect  Lab Results  Component Value Date   WBC 6.1 07/27/2014   HGB 9.8* 07/27/2014   HCT 29.5* 07/27/2014   MCV 87 07/27/2014   PLT 339 07/27/2014   Lab Results  Component Value Date   FERRITIN 21* 06/29/2014   IRON 21* 06/29/2014   TIBC 269 06/29/2014   UIBC 248 06/29/2014   IRONPCTSAT 8* 06/29/2014   Lab Results  Component Value Date   RETICCTPCT 2.4* 06/29/2014   RBC 3.38* 07/27/2014   RETICCTABS 67.4 06/29/2014   No results found for: KPAFRELGTCHN, LAMBDASER, KAPLAMBRATIO No results found for: IGGSERUM, IGA, IGMSERUM No results found for: Odetta Pink, SPEI   Chemistry      Component Value Date/Time   NA 143 06/29/2014 0811   NA 136 06/23/2014 1218   K 4.1 06/29/2014 0811   K 4.5 06/23/2014 1218   CL 105 06/29/2014 0811   CL 105 06/23/2014 1218   CO2 29 06/29/2014 0811   CO2 26 06/23/2014 1218   BUN 18 06/29/2014 0811   BUN 20 06/23/2014 1218   CREATININE 1.9* 06/29/2014 0811   CREATININE 1.82* 06/23/2014 1218      Component Value Date/Time   CALCIUM 8.8 06/29/2014 0811   CALCIUM 8.6 06/23/2014 1218   ALKPHOS 106* 06/29/2014 0811   ALKPHOS 112 03/30/2014 0832   AST 19 06/29/2014 0811   AST 9  03/30/2014 0832   ALT 16 06/29/2014 0811   ALT 8 03/30/2014 0832   BILITOT 0.50 06/29/2014 0811   BILITOT 0.3 03/30/2014 0832     Impression and Plan: [name] is 69 year old gentleman with multifactorial anemia (iron deficiency/renal insufficiency).  His  blood sugars are still not well controlled and his creatinine is elevated at 2.1 today.  His Hgb is up to 9.8 after receiving iron and Feraheme during his last visit.    We will give him Aranesp today and see what his iron studies show.  We will see him back in 1 month for labs and follow-up.  He will contact us with any questions or concerns. We can certainly see him sooner if need be.   Eliezer Bottom, NP 5/12/20168:42 AM

## 2014-08-25 ENCOUNTER — Other Ambulatory Visit: Payer: Medicare Other

## 2014-08-25 ENCOUNTER — Ambulatory Visit: Payer: Medicare Other | Admitting: Family

## 2014-09-04 ENCOUNTER — Emergency Department (HOSPITAL_BASED_OUTPATIENT_CLINIC_OR_DEPARTMENT_OTHER)
Admission: EM | Admit: 2014-09-04 | Discharge: 2014-09-04 | Disposition: A | Discharge status: Home / Self Care | Payer: Medicare Other | Attending: Emergency Medicine | Admitting: Emergency Medicine

## 2014-09-04 ENCOUNTER — Encounter (HOSPITAL_BASED_OUTPATIENT_CLINIC_OR_DEPARTMENT_OTHER): Payer: Self-pay | Admitting: *Deleted

## 2014-09-04 ENCOUNTER — Emergency Department (HOSPITAL_BASED_OUTPATIENT_CLINIC_OR_DEPARTMENT_OTHER): Payer: Medicare Other

## 2014-09-04 ENCOUNTER — Telehealth: Payer: Self-pay | Admitting: Family

## 2014-09-04 DIAGNOSIS — Z79899 Other long term (current) drug therapy: Secondary | ICD-10-CM | POA: Diagnosis not present

## 2014-09-04 DIAGNOSIS — Z792 Long term (current) use of antibiotics: Secondary | ICD-10-CM | POA: Diagnosis not present

## 2014-09-04 DIAGNOSIS — Z7982 Long term (current) use of aspirin: Secondary | ICD-10-CM | POA: Insufficient documentation

## 2014-09-04 DIAGNOSIS — E780 Pure hypercholesterolemia: Secondary | ICD-10-CM | POA: Diagnosis not present

## 2014-09-04 DIAGNOSIS — Z794 Long term (current) use of insulin: Secondary | ICD-10-CM | POA: Diagnosis not present

## 2014-09-04 DIAGNOSIS — G629 Polyneuropathy, unspecified: Secondary | ICD-10-CM | POA: Diagnosis not present

## 2014-09-04 DIAGNOSIS — G4730 Sleep apnea, unspecified: Secondary | ICD-10-CM | POA: Diagnosis not present

## 2014-09-04 DIAGNOSIS — Z8673 Personal history of transient ischemic attack (TIA), and cerebral infarction without residual deficits: Secondary | ICD-10-CM | POA: Diagnosis not present

## 2014-09-04 DIAGNOSIS — Z720 Tobacco use: Secondary | ICD-10-CM | POA: Diagnosis not present

## 2014-09-04 DIAGNOSIS — N400 Enlarged prostate without lower urinary tract symptoms: Secondary | ICD-10-CM | POA: Diagnosis not present

## 2014-09-04 DIAGNOSIS — Z8546 Personal history of malignant neoplasm of prostate: Secondary | ICD-10-CM | POA: Insufficient documentation

## 2014-09-04 DIAGNOSIS — Z862 Personal history of diseases of the blood and blood-forming organs and certain disorders involving the immune mechanism: Secondary | ICD-10-CM | POA: Insufficient documentation

## 2014-09-04 DIAGNOSIS — K219 Gastro-esophageal reflux disease without esophagitis: Secondary | ICD-10-CM | POA: Insufficient documentation

## 2014-09-04 DIAGNOSIS — I2510 Atherosclerotic heart disease of native coronary artery without angina pectoris: Secondary | ICD-10-CM | POA: Insufficient documentation

## 2014-09-04 DIAGNOSIS — Z9089 Acquired absence of other organs: Secondary | ICD-10-CM | POA: Diagnosis not present

## 2014-09-04 DIAGNOSIS — Z9889 Other specified postprocedural states: Secondary | ICD-10-CM | POA: Diagnosis not present

## 2014-09-04 DIAGNOSIS — R079 Chest pain, unspecified: Secondary | ICD-10-CM

## 2014-09-04 DIAGNOSIS — N183 Chronic kidney disease, stage 3 (moderate): Secondary | ICD-10-CM | POA: Diagnosis not present

## 2014-09-04 DIAGNOSIS — Z86010 Personal history of colonic polyps: Secondary | ICD-10-CM | POA: Insufficient documentation

## 2014-09-04 DIAGNOSIS — R12 Heartburn: Secondary | ICD-10-CM | POA: Diagnosis present

## 2014-09-04 DIAGNOSIS — I129 Hypertensive chronic kidney disease with stage 1 through stage 4 chronic kidney disease, or unspecified chronic kidney disease: Secondary | ICD-10-CM | POA: Diagnosis not present

## 2014-09-04 DIAGNOSIS — G8929 Other chronic pain: Secondary | ICD-10-CM | POA: Insufficient documentation

## 2014-09-04 DIAGNOSIS — E1140 Type 2 diabetes mellitus with diabetic neuropathy, unspecified: Secondary | ICD-10-CM | POA: Diagnosis not present

## 2014-09-04 DIAGNOSIS — Z9981 Dependence on supplemental oxygen: Secondary | ICD-10-CM | POA: Insufficient documentation

## 2014-09-04 LAB — TROPONIN I: Troponin I: <0.03 ng/mL (ref <0.031)

## 2014-09-04 MED ORDER — GI COCKTAIL ~~LOC~~
30.0000 mL | Freq: Once | ORAL | Status: AC
  Administered 2014-09-04: 30 mL via ORAL
  Filled 2014-09-04: qty 30

## 2014-09-04 NOTE — ED Provider Notes (Signed)
CSN: [id number]     Arrival date & time 09/04/14  0207 History   First MD Initiated Contact with Patient 09/04/14 0225     Chief Complaint  Patient presents with  . heartburn      (Consider location/radiation/quality/duration/timing/severity/associated sxs/prior Treatment) HPI  This is a 69 year old male with history of hypertension, hyperlipidemia, reflux, coronary artery disease who presents with "heartburn." Patient reports he has been experiencing heartburn since 7 PM. He describes a burning in his mid chest. It occurred after he had a hamburger for dinner. He takes Zantac daily and normally this controls his heartburn. He denies any shortness of breath, nausea, vomiting, diaphoresis. Patient states that this is not similar to when he was found to have coronary artery disease in the past.  Past Medical History  Diagnosis Date  . Hypertension   . High cholesterol   . GERD (gastroesophageal reflux disease)   . GI bleed     a. Recurrent GI bleed, tx with IV iron. b. Per heme notes - likely AVMs 04/2012 (tx with cauterization several months ago).  . Orthostatic hypotension     a. Tx with florinef.  . Hematuria     a. Urology note scan from 07/2012: cystoscopy without evidence for bladder lesion, only lateral hypertrophy of posterior urethra, bladder impression from BPH. b. Pt states he had "some tests" scheduled for later in July 2014.  . Stage III chronic kidney disease     a. Stage 3 (DM with complications ->CKD, peripheral neuropathy).  . Anemia, iron deficiency 03/24/2011    a. Recurrent GI bleed, tx with periodic iron infusions.  . 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 Sleep apnea     "had mask; couldn't sleep in it" (09/20/2012)  . Type II diabetes mellitus     a. Dx 1994, uncontrolled.   . Diabetic peripheral neuropathy 2014    foot pain.  . Chronic lower back pain   . Fatty liver     on CT of 11/2010  . AVM (arteriovenous malformation) of duodenum, acquired     egds in  01/2012, 04/2011  . Polyp, colonic     Colonoscopy 01/2012 "benign" polyp  . CAD (coronary artery disease)     a. Cath 09/2012: moderate borderline CAD in mid LAD/small diagonal branch, mild RCA stenosis, to be managed medically   . BPH (benign prostatic hyperplasia)   . Prostate cancer   . Stroke   . Peripheral neuropathy   . 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 Shoulder open rotator cuff repair Left 1980's  . Inguinal hernia repair Right 2011  . Lumbar disc surgery  1980's  . Robot assisted laparoscopic radical prostatectomy N/A 01/19/2013    Procedure: ROBOTIC ASSISTED LAPAROSCOPIC RADICAL PROSTATECTOMY;  Surgeon: Bernestine Amass, MD;  Location: [facility];  Service: Urology;  Laterality: N/A;  . Lymphadenectomy Bilateral 01/19/2013    Procedure: LYMPHADENECTOMY;  Surgeon: Bernestine Amass, MD;  Location: [facility];  Service: Urology;  Laterality: Bilateral;  . Enteroscopy N/A 03/01/2013    Procedure: ENTEROSCOPY;  Surgeon: Inda Castle, MD;  Location: [place];  Service: Endoscopy;  Laterality: N/A;  . Left heart catheterization with coronary angiogram N/A 09/21/2012    Procedure: LEFT HEART CATHETERIZATION WITH CORONARY ANGIOGRAM;  Surgeon: Peter M Martinique, MD;  Location: [facility] C[facility];  Service: Cardiovascular;  Laterality: N/A;   Family History  Problem Relation Age of Onset  . Heart attack Brother  Died at 13  . Stroke Father     Died at 61  . Diabetes Sister   . Diabetes Mother   . Stomach cancer Brother   . Heart attack Sister    History  Substance Use Topics  . Smoking status: Current Some Day Smoker -- 1.00 packs/day for 43 years    Types: Cigarettes    Start date: 04/15/1968  . Smokeless tobacco: Never Used     Comment: 05-12--2016  still smoking  . Alcohol Use: No     Comment: 09/20/2012 "Used to; stopped ~ 2009; never had problem w/it"    Review of Systems  Constitutional: Negative.  Negative for  fever.  Respiratory: Negative.  Negative for cough, chest tightness and shortness of breath.   Cardiovascular: Positive for chest pain.  Gastrointestinal: Negative.  Negative for nausea, vomiting and abdominal pain.  Genitourinary: Negative.   Neurological: Negative for headaches.  All other systems reviewed and are negative.     Allergies  Review of patient's allergies indicates no known allergies.  Home Medications   Prior to Admission medications   Medication Sig Start Date End Date Taking? Authorizing Provider  aspirin EC 81 MG tablet Take 81 mg by mouth daily.    Historical Provider, MD  atorvastatin (LIPITOR) 80 MG tablet Take 1 tablet (80 mg total) by mouth daily. 07/19/14   [name], NP  chlorpheniramine-HYDROcodone (TUSSIONEX PENNKINETIC ER) 10-8 MG/5ML LQCR Take 5 mLs by mouth every 12 (twelve) hours as needed. Patient not taking: Reported on 06/29/2014 02/08/14   Brunetta Jeans, PA-C  CVS VITAMIN D 2000 UNITS CAPS Take 1 capsule by mouth daily. 03/24/14   Historical Provider, MD  ezetimibe (ZETIA) 10 MG tablet Take 1 tablet (10 mg total) by mouth daily. 06/23/14   [name], NP  gabapentin (NEURONTIN) 300 MG capsule TAKE 1 CAPSULE (300 MG TOTAL) BY MOUTH AT BEDTIME. 05/19/14   [name], NP  hydrALAZINE (APRESOLINE) 10 MG tablet TAKE 1 TABLET BY MOUTH EVERY 8 HOURS 05/16/13   [name], NP  Insulin Detemir (LEVEMIR) 100 UNIT/ML Pen Inject 10 Units into the skin daily at 10 pm. Patient taking differently: Inject 12 Units into the skin daily at 10 pm.  03/23/14   [name], NP  insulin lispro (HUMALOG) 100 UNIT/ML injection USE AS ADVISED - MAXIMUM 25 UNITS PER DAY. INJECT 7-7-5 UNITS PLUS SSI AS ADVISED. Patient taking differently: Inject into the skin 3 (three) times daily with meals. USE AS ADVISED - MAXIMUM 25 UNITS PER DAY. INJECT 7---- 25 UNITS PLUS SSI AS ADVISED. 03/23/14   [name], NP  Insulin Pen Needle (B-D ULTRAFINE III  SHORT PEN) 31G X 8 MM MISC USE AS DIRECTED WITH INSULIN Dx: 250.02 12/05/13   [name], NP  levofloxacin (LEVAQUIN) 500 MG tablet Take 1 tablet (500 mg total) by mouth daily. 02/13/14   [name], NP  lisinopril (PRINIVIL,ZESTRIL) 10 MG tablet TAKE 1 TABLET (10 MG TOTAL) BY MOUTH DAILY. 04/12/14   [name], NP  NEXIUM 40 MG capsule TAKE 1 CAPSULE (40 MG TOTAL) BY MOUTH DAILY AT 12 NOON. 03/24/14   Milus Banister, MD  nitroGLYCERIN (NITROSTAT) 0.4 MG SL tablet Place 1 tablet (0.4 mg total) under the tongue every 5 (five) minutes as needed for chest pain (up to 3 doses). Patient not taking: Reported on 07/27/2014 09/22/12   Dayna N Dunn, PA-C  pantoprazole (PROTONIX) 40 MG tablet Take 1 tablet (40 mg total) by mouth daily. 05/24/13  [name], NP  polyvinyl alcohol (LIQUIFILM TEARS) 1.4 % ophthalmic solution Place 1 drop into both eyes daily as needed (dry eyes).    Historical Provider, MD  potassium chloride SA (KLOR-CON M20) 20 MEQ tablet Take 2 tablets (40 mEq total) by mouth once. Take 2 pills a day 05/30/13   Volanda Napoleon, MD  traMADol (ULTRAM) 50 MG tablet Take 1 tablet (50 mg total) by mouth every 8 (eight) hours as needed. Patient not taking: Reported on 07/27/2014 10/12/13   [name], NP   BP 165/85 mmHg  Pulse 84  Temp(Src) 98.1 F (36.7 C) (Oral)  Resp 18  Ht 5\' 7"  (1.702 m)  Wt 158 lb (71.668 kg)  BMI 24.74 kg/m2  SpO2 95% Physical Exam  Constitutional: He is oriented to person, place, and time. He appears well-developed and well-nourished. No distress.  HENT:  Head: Normocephalic and atraumatic.  Eyes: Pupils are equal, round, and reactive to light.  Neck: Neck supple.  Cardiovascular: Normal rate, regular rhythm and normal heart sounds.   No murmur heard. Pulmonary/Chest: Effort normal and breath sounds normal. No respiratory distress. He has no wheezes.  Abdominal: Soft. Bowel sounds are normal. There is no tenderness. There is no  rebound.  Musculoskeletal: He exhibits no edema.  Lymphadenopathy:    He has no cervical adenopathy.  Neurological: He is alert and oriented to person, place, and time.  Skin: Skin is warm and dry.  Psychiatric: He has a normal mood and affect.  Nursing note and vitals reviewed.   ED Course  Procedures (including critical care time) Labs Review Labs Reviewed  TROPONIN I    Imaging Review Dg Chest 2 View  09/04/2014   CLINICAL DATA:  Acute onset of vomiting.  Initial encounter.  EXAM: CHEST  2 VIEW  COMPARISON:  Chest radiograph 03/23/2013  FINDINGS: The lungs are well-aerated. Pulmonary vascularity is at the upper limits of normal. There is no evidence of focal opacification, pleural effusion or pneumothorax.  The heart is normal in size; the mediastinal contour is within normal limits. No acute osseous abnormalities are seen.  IMPRESSION: No acute cardiopulmonary process seen.   Electronically Signed   By: Garald Balding M.D.   On: 09/04/2014 03:51     EKG Interpretation ED ECG REPORT   Date: 09/04/2014  Rate: 104  Rhythm: sinus tachycardia  QRS Axis: normal  Intervals: normal  ST/T Wave abnormalities: normal  Conduction Disutrbances:none  Narrative Interpretation:   Old EKG Reviewed: unchanged  I have personally reviewed the EKG tracing and agree with the computerized printout as noted.       MDM   Final diagnoses:  Chest pain, unspecified chest pain type  Gastroesophageal reflux disease, esophagitis presence not specified    Patient presents with burning chest pain which he relates to heartburn. Nontoxic on exam. Hypertensive blood pressure of 160/84. Otherwise nontoxic. Has a history of GERD but also has a history of coronary artery disease.  Given that the symptoms occurred following dinner and are described as burning, have higher suspicion for GERD. EKG is unchanged from prior. Chest x-ray and troponin are negative. Patient improved with a GI cocktail. Suspect GI  origin. Given that the onset of symptoms was at 7 PM and the troponin was obtained at approximately 2:30 AM, feel this is sufficient for evaluation while in the ER.  After history, exam, and medical workup I feel the patient has been appropriately medically screened and is safe for discharge home. Pertinent diagnoses  were discussed with the patient. Patient was given return precautions.     Merryl Hacker, MD 09/04/14 [phone]

## 2014-09-04 NOTE — ED Notes (Signed)
Pt alert, NAD, calm, interactive, resps e/u, speaking in clear complete sentences, no dyspnea noted. VSS.

## 2014-09-04 NOTE — ED Notes (Signed)
Patient states he feels like he has indigestion. Has had symptoms since 7PM, but has gotten worse. Patient states he has pain in left arm at elbow. Patient states he had a heart attack one year ago. I took ECG, placed patient on monitor, and took vitals.

## 2014-09-04 NOTE — ED Notes (Signed)
Pt c/o of "heartburn" that started around 7pm. States that he took a Zantac without relief. Describes as a burning type pain. Denies any sob. Denies any n/v at present. States he did have one episode of vomiting yesterday but states none since. Denies any diarrhea, fevers, or any other pain.

## 2014-09-04 NOTE — Telephone Encounter (Signed)
Appointment scheduled for 09/11/14

## 2014-09-04 NOTE — Telephone Encounter (Signed)
Please contact pt to arrange ED follow up.

## 2014-09-04 NOTE — Discharge Instructions (Signed)
You were seen today for chest pain.  Your pain was likely related to your reflux.  Your heart tests are reassuring.  COntine your acid reducer at home.  Gastroesophageal Reflux Disease, Adult Gastroesophageal reflux disease (GERD) happens when acid from your stomach flows up into the esophagus. When acid comes in contact with the esophagus, the acid causes soreness (inflammation) in the esophagus. Over time, GERD may create small holes (ulcers) in the lining of the esophagus. CAUSES   Increased body weight. This puts pressure on the stomach, making acid rise from the stomach into the esophagus.  Smoking. This increases acid production in the stomach.  Drinking alcohol. This causes decreased pressure in the lower esophageal sphincter (valve or ring of muscle between the esophagus and stomach), allowing acid from the stomach into the esophagus.  Late evening meals and a full stomach. This increases pressure and acid production in the stomach.  A malformed lower esophageal sphincter. Sometimes, no cause is found. SYMPTOMS   Burning pain in the lower part of the mid-chest behind the breastbone and in the mid-stomach area. This may occur twice a week or more often.  Trouble swallowing.  Sore throat.  Dry cough.  Asthma-like symptoms including chest tightness, shortness of breath, or wheezing. DIAGNOSIS  Your caregiver may be able to diagnose GERD based on your symptoms. In some cases, X-rays and other tests may be done to check for complications or to check the condition of your stomach and esophagus. TREATMENT  Your caregiver may recommend over-the-counter or prescription medicines to help decrease acid production. Ask your caregiver before starting or adding any new medicines.  HOME CARE INSTRUCTIONS   Change the factors that you can control. Ask your caregiver for guidance concerning weight loss, quitting smoking, and alcohol consumption.  Avoid foods and drinks that make your  symptoms worse, such as:  Caffeine or alcoholic drinks.  Chocolate.  Peppermint or mint flavorings.  Garlic and onions.  Spicy foods.  Citrus fruits, such as oranges, lemons, or limes.  Tomato-based foods such as sauce, chili, salsa, and pizza.  Fried and fatty foods.  Avoid lying down for the 3 hours prior to your bedtime or prior to taking a nap.  Eat small, frequent meals instead of large meals.  Wear loose-fitting clothing. Do not wear anything tight around your waist that causes pressure on your stomach.  Raise the head of your bed 6 to 8 inches with wood blocks to help you sleep. Extra pillows will not help.  Only take over-the-counter or prescription medicines for pain, discomfort, or fever as directed by your caregiver.  Do not take aspirin, ibuprofen, or other nonsteroidal anti-inflammatory drugs (NSAIDs). SEEK IMMEDIATE MEDICAL CARE IF:   You have pain in your arms, neck, jaw, teeth, or back.  Your pain increases or changes in intensity or duration.  You develop nausea, vomiting, or sweating (diaphoresis).  You develop shortness of breath, or you faint.  Your vomit is green, yellow, black, or looks like coffee grounds or blood.  Your stool is red, bloody, or black. These symptoms could be signs of other problems, such as heart disease, gastric bleeding, or esophageal bleeding. MAKE SURE YOU:   Understand these instructions.  Will watch your condition.  Will get help right away if you are not doing well or get worse. Document Released: 12/11/2004 Document Revised: 05/26/2011 Document Reviewed: 09/20/2010 [facility] Patient Information 2015 [place], [place]. This information is not intended to replace advice given to you by your health care  provider. Make sure you discuss any questions you have with your health care provider. ° °

## 2014-09-04 NOTE — ED Notes (Signed)
Dr. Horton at [facility].  

## 2014-09-04 NOTE — ED Notes (Signed)
Back from xray, alert, NAD, calm, no changes.

## 2014-09-05 ENCOUNTER — Telehealth: Payer: Self-pay | Admitting: *Deleted

## 2014-09-05 ENCOUNTER — Ambulatory Visit (HOSPITAL_BASED_OUTPATIENT_CLINIC_OR_DEPARTMENT_OTHER): Payer: Medicare Other | Admitting: Family

## 2014-09-05 ENCOUNTER — Ambulatory Visit (HOSPITAL_COMMUNITY)
Admission: RE | Admit: 2014-09-05 | Discharge: 2014-09-05 | Disposition: A | Discharge status: Home / Self Care | Payer: Medicare Other | Source: Ambulatory Visit | Attending: Hematology & Oncology | Admitting: Hematology & Oncology

## 2014-09-05 ENCOUNTER — Ambulatory Visit (HOSPITAL_BASED_OUTPATIENT_CLINIC_OR_DEPARTMENT_OTHER): Payer: Medicare Other

## 2014-09-05 ENCOUNTER — Other Ambulatory Visit (HOSPITAL_BASED_OUTPATIENT_CLINIC_OR_DEPARTMENT_OTHER): Payer: Medicare Other

## 2014-09-05 ENCOUNTER — Encounter: Payer: Self-pay | Admitting: Family

## 2014-09-05 VITALS — BP 168/82 | HR 78 | Temp 98.2°F | Wt 155.0 lb

## 2014-09-05 DIAGNOSIS — E1165 Type 2 diabetes mellitus with hyperglycemia: Secondary | ICD-10-CM

## 2014-09-05 DIAGNOSIS — D631 Anemia in chronic kidney disease: Secondary | ICD-10-CM

## 2014-09-05 DIAGNOSIS — N289 Disorder of kidney and ureter, unspecified: Secondary | ICD-10-CM

## 2014-09-05 DIAGNOSIS — D509 Iron deficiency anemia, unspecified: Secondary | ICD-10-CM

## 2014-09-05 DIAGNOSIS — N189 Chronic kidney disease, unspecified: Secondary | ICD-10-CM | POA: Diagnosis not present

## 2014-09-05 DIAGNOSIS — K254 Chronic or unspecified gastric ulcer with hemorrhage: Secondary | ICD-10-CM

## 2014-09-05 DIAGNOSIS — D649 Anemia, unspecified: Secondary | ICD-10-CM | POA: Diagnosis present

## 2014-09-05 DIAGNOSIS — E1122 Type 2 diabetes mellitus with diabetic chronic kidney disease: Secondary | ICD-10-CM

## 2014-09-05 DIAGNOSIS — D500 Iron deficiency anemia secondary to blood loss (chronic): Secondary | ICD-10-CM

## 2014-09-05 LAB — COMPREHENSIVE METABOLIC PANEL
ALK PHOS: 102 U/L (ref 39–117)
ALT: 8 U/L (ref 0–53)
AST: 9 U/L (ref 0–37)
Albumin: 2.5 g/dL — ABNORMAL LOW (ref 3.5–5.2)
BUN: 23 mg/dL (ref 6–23)
CO2: 24 meq/L (ref 19–32)
CREATININE: 2.13 mg/dL — AB (ref 0.50–1.35)
Calcium: 8 mg/dL — ABNORMAL LOW (ref 8.4–10.5)
Chloride: 108 mEq/L (ref 96–112)
Glucose, Bld: 291 mg/dL — ABNORMAL HIGH (ref 70–99)
Potassium: 4.5 mEq/L (ref 3.5–5.3)
SODIUM: 138 meq/L (ref 135–145)
TOTAL PROTEIN: 5.5 g/dL — AB (ref 6.0–8.3)
Total Bilirubin: 0.2 mg/dL (ref 0.2–1.2)

## 2014-09-05 LAB — CBC WITH DIFFERENTIAL (CANCER CENTER ONLY)
BASO#: 0 10*3/uL (ref 0.0–0.2)
BASO%: 0.7 % (ref 0.0–2.0)
EOS ABS: 0.1 10*3/uL (ref 0.0–0.5)
EOS%: 1.7 % (ref 0.0–7.0)
HCT: 21 % — ABNORMAL LOW (ref 38.7–49.9)
HGB: 6.9 g/dL — CL (ref 13.0–17.1)
LYMPH#: 1 10*3/uL (ref 0.9–3.3)
LYMPH%: 17.7 % (ref 14.0–48.0)
MCH: 28.2 pg (ref 28.0–33.4)
MCHC: 32.9 g/dL (ref 32.0–35.9)
MCV: 86 fL (ref 82–98)
MONO#: 0.7 10*3/uL (ref 0.1–0.9)
MONO%: 11.8 % (ref 0.0–13.0)
NEUT%: 68.1 % (ref 40.0–80.0)
NEUTROS ABS: 4 10*3/uL (ref 1.5–6.5)
PLATELETS: 391 10*3/uL (ref 145–400)
RBC: 2.45 10*6/uL — AB (ref 4.20–5.70)
RDW: 14.1 % (ref 11.1–15.7)
WBC: 5.9 10*3/uL (ref 4.0–10.0)

## 2014-09-05 LAB — RETICULOCYTES (CHCC)
ABS Retic: 65.8 10*3/uL (ref 19.0–186.0)
RBC.: 2.53 MIL/uL — ABNORMAL LOW (ref 4.22–5.81)
Retic Ct Pct: 2.6 % — ABNORMAL HIGH (ref 0.4–2.3)

## 2014-09-05 MED ORDER — SODIUM CHLORIDE 0.9 % IV SOLN
510.0000 mg | Freq: Once | INTRAVENOUS | Status: AC
  Administered 2014-09-05: 510 mg via INTRAVENOUS
  Filled 2014-09-05: qty 17

## 2014-09-05 MED ORDER — DARBEPOETIN ALFA 300 MCG/0.6ML IJ SOSY
PREFILLED_SYRINGE | INTRAMUSCULAR | Status: AC
  Filled 2014-09-05: qty 0.6

## 2014-09-05 MED ORDER — DARBEPOETIN ALFA 300 MCG/0.6ML IJ SOSY
300.0000 ug | PREFILLED_SYRINGE | Freq: Once | INTRAMUSCULAR | Status: AC
  Administered 2014-09-05: 300 ug via SUBCUTANEOUS

## 2014-09-05 NOTE — Telephone Encounter (Signed)
Critical Value 6.9 Dr Marin Olp notified. No order received at this time.

## 2014-09-05 NOTE — Patient Instructions (Signed)

## 2014-09-05 NOTE — Progress Notes (Signed)
Hematology and Oncology Follow Up Visit  [name] [id number] Oct 27, 1945 69 y.o. 09/05/2014   Principle Diagnosis:  1. Iron-deficiency anemia secondary to recurrent gastrointestinal bleeding. 2. 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  Current Therapy:   1. Aranesp 300 mcg subcu as needed for hemoglobin less than 10 2. IV iron as indicated    Interim History:  [name] is here today for a follow-up. He is feeling fatigued and SOB with any exertion. He has had some dizziness and close call but thankfully no falls or syncopal episode.   His Hgb today is down to 6.9. He denies having any episode of bleeding or bruising. No blood in the urine or stool.  His last dose of Feraheme was in April. In May his ferritin was 51 and iron saturation was 17%.  He denies fever, chills, n/v, cough, rash, blurred vision, chest pain, palpitations, abdominal pain, constipation or diarrhea.  He is still smoking.  He still has swelling in his ankle that comes and goes. He has also had some tingling in his hands and feet lately.  His appetite is good and he is trying to stay hydrated but feels that he could be drinking more water.   Medications:    Medication List       This list is accurate as of: 09/05/14  3:52 PM.  Always use your most recent med list.               aspirin EC 81 MG tablet  Take 81 mg by mouth daily.     atorvastatin 80 MG tablet  Commonly known as:  LIPITOR  Take 1 tablet (80 mg total) by mouth daily.     chlorpheniramine-HYDROcodone 10-8 MG/5ML Lqcr  Commonly known as:  TUSSIONEX PENNKINETIC ER  Take 5 mLs by mouth every 12 (twelve) hours as needed.     [employer]S VITAMIN D 2000 UNITS Caps  Generic drug:  Cholecalciferol  Take 1 capsule by mouth daily.     ezetimibe 10 MG tablet  Commonly known as:  ZETIA  Take 1 tablet (10 mg total) by mouth daily.     gabapentin 300 MG capsule  Commonly known as:  NEURONTIN  TAKE 1 CAPSULE (300 MG  TOTAL) BY MOUTH AT BEDTIME.     hydrALAZINE 10 MG tablet  Commonly known as:  APRESOLINE  TAKE 1 TABLET BY MOUTH EVERY 8 HOURS     Insulin Detemir 100 UNIT/ML Pen  Commonly known as:  LEVEMIR  Inject 10 Units into the skin daily at 10 pm.     insulin lispro 100 UNIT/ML injection  Commonly known as:  HUMALOG  USE AS ADVISED - MAXIMUM 25 UNITS PER DAY. INJECT 7-7-5 UNITS PLUS SSI AS ADVISED.     Insulin Pen Needle 31G X 8 MM Misc  Commonly known as:  B-D ULTRAFINE III SHORT PEN  USE AS DIRECTED WITH INSULIN Dx: 250.02     levofloxacin 500 MG tablet  Commonly known as:  LEVAQUIN  Take 1 tablet (500 mg total) by mouth daily.     lisinopril 10 MG tablet  Commonly known as:  PRINIVIL,ZESTRIL  TAKE 1 TABLET (10 MG TOTAL) BY MOUTH DAILY.     NEXIUM 40 MG capsule  Generic drug:  esomeprazole  TAKE 1 CAPSULE (40 MG TOTAL) BY MOUTH DAILY AT 12 NOON.     nitroGLYCERIN 0.4 MG SL tablet  Commonly known as:  NITROSTAT  Place 1 tablet (0.4 mg total)  under the tongue every 5 (five) minutes as needed for chest pain (up to 3 doses).     pantoprazole 40 MG tablet  Commonly known as:  PROTONIX  Take 1 tablet (40 mg total) by mouth daily.     polyvinyl alcohol 1.4 % ophthalmic solution  Commonly known as:  LIQUIFILM TEARS  Place 1 drop into both eyes daily as needed (dry eyes).     potassium chloride SA 20 MEQ tablet  Commonly known as:  KLOR-CON M20  Take 2 tablets (40 mEq total) by mouth once. Take 2 pills a day     traMADol 50 MG tablet  Commonly known as:  ULTRAM  Take 1 tablet (50 mg total) by mouth every 8 (eight) hours as needed.        Allergies: No Known Allergies  Past Medical History, Surgical history, Social history, and Family History were reviewed and updated.  Review of Systems: All other 10 point review of systems is negative.   Physical Exam:  weight is 155 lb (70.308 kg). His oral temperature is 98.2 F (36.8 C). His blood pressure is 168/82 and his pulse is  78.   Wt Readings from Last 3 Encounters:  09/05/14 155 lb (70.308 kg)  09/04/14 158 lb (71.668 kg)  07/27/14 152 lb (68.947 kg)    Ocular: Sclerae unicteric, pupils equal, round and reactive to light Ear-nose-throat: Oropharynx clear, dentition fair Lymphatic: No cervical or supraclavicular adenopathy Lungs no rales or rhonchi, good excursion bilaterally Heart regular rate and rhythm, no murmur appreciated Abd soft, nontender, positive bowel sounds MSK no focal spinal tenderness, no joint edema Neuro: non-focal, well-oriented, appropriate affect  Lab Results  Component Value Date   WBC 5.9 09/05/2014   HGB 6.9* 09/05/2014   HCT 21.0* 09/05/2014   MCV 86 09/05/2014   PLT 391 09/05/2014   Lab Results  Component Value Date   FERRITIN 51 07/27/2014   IRON 39* 07/27/2014   TIBC 235 07/27/2014   UIBC 196 07/27/2014   IRONPCTSAT 17* 07/27/2014   Lab Results  Component Value Date   RETICCTPCT 2.6* 09/05/2014   RBC 2.53* 09/05/2014   RETICCTABS 65.8 09/05/2014   No results found for: KPAFRELGTCHN, LAMBDASER, KAPLAMBRATIO No results found for: IGGSERUM, IGA, IGMSERUM No results found for: Odetta Pink, SPEI   Chemistry      Component Value Date/Time   NA 139 07/27/2014 0821   NA 136 06/23/2014 1218   K 4.0 07/27/2014 0821   K 4.5 06/23/2014 1218   CL 106 07/27/2014 0821   CL 105 06/23/2014 1218   CO2 26 07/27/2014 0821   CO2 26 06/23/2014 1218   BUN 18 07/27/2014 0821   BUN 20 06/23/2014 1218   CREATININE 2.1* 07/27/2014 0821   CREATININE 1.82* 06/23/2014 1218      Component Value Date/Time   CALCIUM 9.2 07/27/2014 0821   CALCIUM 8.6 06/23/2014 1218   ALKPHOS 117* 07/27/2014 0821   ALKPHOS 112 03/30/2014 0832   AST 16 07/27/2014 0821   AST 9 03/30/2014 0832   ALT 16 07/27/2014 0821   ALT 8 03/30/2014 0832   BILITOT 0.60 07/27/2014 0821   BILITOT 0.3 03/30/2014 0832     Impression and Plan: [name] is  69 year old gentleman with multifactorial anemia (iron deficiency/renal insufficiency). His is symptomatic with SOB, dizziness, fatigue and numbness and tingling in his hands and feet.  His Hgb is 6.9 today and MCV 86. We will go ahead  and give him a dose of Feraheme and also a dose of Aranesp.  We will see him back in 1 week to check a CBC and give him another dose of Feraheme.  He is going to drink more fluids this week and will contact us if his symptoms worsen.  We can certainly see him sooner if need be.   Eliezer Bottom, NP 6/21/20163:52 PM

## 2014-09-06 LAB — IRON AND TIBC CHCC
%SAT: 14 % — ABNORMAL LOW (ref 20–55)
Iron: 35 ug/dL — ABNORMAL LOW (ref 42–163)
TIBC: 249 ug/dL (ref 202–409)
UIBC: 214 ug/dL (ref 117–376)

## 2014-09-06 LAB — FERRITIN CHCC: Ferritin: 17 ng/ml — ABNORMAL LOW (ref 22–316)

## 2014-09-07 ENCOUNTER — Other Ambulatory Visit: Payer: Self-pay | Admitting: Family

## 2014-09-11 ENCOUNTER — Ambulatory Visit (INDEPENDENT_AMBULATORY_CARE_PROVIDER_SITE_OTHER): Payer: Medicare Other | Admitting: Family

## 2014-09-11 ENCOUNTER — Encounter: Payer: Self-pay | Admitting: Family

## 2014-09-11 VITALS — BP 132/70 | HR 104 | Temp 98.1°F | Ht 67.0 in | Wt 154.2 lb

## 2014-09-11 DIAGNOSIS — D500 Iron deficiency anemia secondary to blood loss (chronic): Secondary | ICD-10-CM | POA: Diagnosis not present

## 2014-09-11 DIAGNOSIS — D509 Iron deficiency anemia, unspecified: Secondary | ICD-10-CM

## 2014-09-11 DIAGNOSIS — I2510 Atherosclerotic heart disease of native coronary artery without angina pectoris: Secondary | ICD-10-CM | POA: Diagnosis not present

## 2014-09-11 MED ORDER — ATORVASTATIN CALCIUM 80 MG PO TABS: 80.0000 mg | ORAL_TABLET | Freq: Every day | ORAL | Status: DC

## 2014-09-11 MED ORDER — GABAPENTIN 300 MG PO CAPS: ORAL_CAPSULE | ORAL | Status: DC

## 2014-09-11 NOTE — Progress Notes (Signed)
Subjective:    Patient ID: [name], male    DOB: Jun 25, 1945, 69 y.o.   MRN: [id number]  HPI  [name] is a 69 yr old male who presents today for ED follow up. He was seen in the ED on 09/04/14 with "heartburn."  ED record is reviewed. EKG was unchanged during this visit and cardiac enzymes were negative.  He does have hx of non-obstructive CAD (cath 09/21/12- 1. Moderate borderline obstructive CAD in the mid LAD and small diagonal branch.)  He has not seen cardiology since that time.   Since the ED visit he has had no further chest pain and no heart burn. He denies SOB.  Reports that he fatigues easily. Reports chronic weakness and easy fatigue.  He attributes this to the sun and anemia. Had hgb of 6.9 last week and received a blood transfusion as well as a "shot".     Review of Systems    see HPI  Past Medical History  Diagnosis Date  . Hypertension   . High cholesterol   . GERD (gastroesophageal reflux disease)   . GI bleed     a. Recurrent GI bleed, tx with IV iron. b. Per heme notes - likely AVMs 04/2012 (tx with cauterization several months ago).  . Orthostatic hypotension     a. Tx with florinef.  . Hematuria     a. Urology note scan from 07/2012: cystoscopy without evidence for bladder lesion, only lateral hypertrophy of posterior urethra, bladder impression from BPH. b. Pt states he had "some tests" scheduled for later in July 2014.  . Stage III chronic kidney disease     a. Stage 3 (DM with complications ->CKD, peripheral neuropathy).  . Anemia, iron deficiency 03/24/2011    a. Recurrent GI bleed, tx with periodic iron infusions.  . 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 Sleep apnea     "had mask; couldn't sleep in it" (09/20/2012)  . Type II diabetes mellitus     a. Dx 1994, uncontrolled.   . Diabetic peripheral neuropathy 2014    foot pain.  . Chronic lower back pain   . Fatty liver     on CT of 11/2010  . AVM (arteriovenous malformation) of duodenum, acquired     egds  in 01/2012, 04/2011  . Polyp, colonic     Colonoscopy 01/2012 "benign" polyp  . CAD (coronary artery disease)     a. Cath 09/2012: moderate borderline CAD in mid LAD/small diagonal branch, mild RCA stenosis, to be managed medically   . BPH (benign prostatic hyperplasia)   . Prostate cancer   . Stroke   . Peripheral neuropathy   . 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 Marital Status: Married    Spouse Name: N/A  . Number of Children: 2  . Years of Education: N/A   Occupational History  . taken out of work due to back    Social History Main Topics  . Smoking status: Current Some Day Smoker -- 1.00 packs/day for 43 years    Types: Cigarettes    Start date: 04/15/1968  . Smokeless tobacco: Never Used     Comment: 05-12--2016  still smoking  . Alcohol Use: No     Comment: 09/20/2012 "Used to; stopped ~ 2009; never had problem w/it"  . Drug Use: No  . Sexual Activity: No   Other Topics Concern  . Not on file   Social History  Narrative   Regular exercise: rides horses   Caffeine use: occasionally    Past Surgical History  Procedure Laterality Date  . Shoulder open rotator cuff repair Left 1980's  . Inguinal hernia repair Right 2011  . Lumbar disc surgery  1980's  . Robot assisted laparoscopic radical prostatectomy N/A 01/19/2013    Procedure: ROBOTIC ASSISTED LAPAROSCOPIC RADICAL PROSTATECTOMY;  Surgeon: Bernestine Amass, MD;  Location: [facility];  Service: Urology;  Laterality: N/A;  . Lymphadenectomy Bilateral 01/19/2013    Procedure: LYMPHADENECTOMY;  Surgeon: Bernestine Amass, MD;  Location: [facility];  Service: Urology;  Laterality: Bilateral;  . Enteroscopy N/A 03/01/2013    Procedure: ENTEROSCOPY;  Surgeon: Inda Castle, MD;  Location: [facility];  Service: Endoscopy;  Laterality: N/A;  . Left heart catheterization with coronary angiogram N/A 09/21/2012    Procedure: LEFT HEART CATHETERIZATION WITH CORONARY ANGIOGRAM;  Surgeon:  Peter M Martinique, MD;  Location: [facility] CATH LAB;  Service: Cardiovascular;  Laterality: N/A;    Family History  Problem Relation Age of Onset  . Heart attack Brother     Died at 43  . Stroke Father     Died at 62  . Diabetes Sister   . Diabetes Mother   . Stomach cancer Brother   . Heart attack Sister     No Known Allergies  Current Outpatient Prescriptions on File Prior to Visit  Medication Sig Dispense Refill  . aspirin EC 81 MG tablet Take 81 mg by mouth daily.    [name] Kitchen atorvastatin (LIPITOR) 80 MG tablet Take 1 tablet (80 mg total) by mouth daily. 30 tablet 2  . CVS VITAMIN D 2000 UNITS CAPS Take 1 capsule by mouth daily.  11  . gabapentin (NEURONTIN) 300 MG capsule TAKE 1 CAPSULE (300 MG TOTAL) BY MOUTH AT BEDTIME. 30 capsule 3  . Insulin Detemir (LEVEMIR) 100 UNIT/ML Pen Inject 10 Units into the skin daily at 10 pm. (Patient taking differently: Inject 12 Units into the skin daily at 10 pm. ) 15 mL 5  . insulin lispro (HUMALOG) 100 UNIT/ML injection USE AS ADVISED - MAXIMUM 25 UNITS PER DAY. INJECT 7-7-5 UNITS PLUS SSI AS ADVISED. (Patient taking differently: Inject into the skin 3 (three) times daily with meals. USE AS ADVISED - MAXIMUM 25 UNITS PER DAY. INJECT 7---- 25 UNITS PLUS SSI AS ADVISED.) 10 mL 0  . Insulin Pen Needle (B-D ULTRAFINE III SHORT PEN) 31G X 8 MM MISC USE AS DIRECTED WITH INSULIN Dx: 250.02 100 each 5  . lisinopril (PRINIVIL,ZESTRIL) 10 MG tablet TAKE 1 TABLET (10 MG TOTAL) BY MOUTH DAILY. 30 tablet 1  . NEXIUM 40 MG capsule TAKE 1 CAPSULE (40 MG TOTAL) BY MOUTH DAILY AT 12 NOON. 30 capsule 8  . potassium chloride SA (KLOR-CON M20) 20 MEQ tablet Take 2 tablets (40 mEq total) by mouth once. Take 2 pills a day 60 tablet 6  . ezetimibe (ZETIA) 10 MG tablet Take 1 tablet (10 mg total) by mouth daily. 30 tablet 3  . hydrALAZINE (APRESOLINE) 10 MG tablet TAKE 1 TABLET BY MOUTH EVERY 8 HOURS (Patient not taking: Reported on 09/11/2014) 90 tablet 2  . nitroGLYCERIN (NITROSTAT)  0.4 MG SL tablet Place 1 tablet (0.4 mg total) under the tongue every 5 (five) minutes as needed for chest pain (up to 3 doses). (Patient not taking: Reported on 09/11/2014) 25 tablet 2  . pantoprazole (PROTONIX) 40 MG tablet Take 1 tablet (40 mg total) by mouth daily. 30 tablet  3  . polyvinyl alcohol (LIQUIFILM TEARS) 1.4 % ophthalmic solution Place 1 drop into both eyes daily as needed (dry eyes).    . traMADol (ULTRAM) 50 MG tablet Take 1 tablet (50 mg total) by mouth every 8 (eight) hours as needed. (Patient not taking: Reported on 09/11/2014) 30 tablet 0   No current facility-administered medications on file prior to visit.    BP 132/70 mmHg  Pulse 104  Temp(Src) 98.1 F (36.7 C) (Oral)  Ht 5\' 7"  (1.702 m)  Wt 154 lb 4 oz (69.967 kg)  BMI 24.15 kg/m2  SpO2 99%    Objective:   Physical Exam  Constitutional: He is oriented to person, place, and time. He appears well-developed and well-nourished. No distress.  HENT:  Head: Normocephalic and atraumatic.  Cardiovascular: Regular rhythm.   No murmur heard. Mild tachycardia  Pulmonary/Chest: Effort normal and breath sounds normal. No respiratory distress. He has no wheezes.  Few soft bibasilar rales L>R  Genitourinary: Rectum normal. Guaiac negative stool.  Musculoskeletal: He exhibits no edema.  Neurological: He is alert and oriented to person, place, and time.  Skin: Skin is warm and dry.  Psychiatric: He has a normal mood and affect. His behavior is normal. Thought content normal.          Assessment & Plan:

## 2014-09-11 NOTE — Assessment & Plan Note (Signed)
Given recent atypical CP, will refer back to cardiology for ongoing medical management of non-obstructive CAD.

## 2014-09-11 NOTE — Patient Instructions (Addendum)
Please complete lab work prior to leaving. Follow up in July as scheduled.   You will be contacted about your referral to cardiology. Go to ER if you develop recurrent chest pain.

## 2014-09-11 NOTE — Assessment & Plan Note (Addendum)
S/p recent transfusion per heme.  Still reporting fatigue. Mild tachycardia.  Obtain CBC. Heme negative.

## 2014-09-11 NOTE — Progress Notes (Signed)
Pre visit review using our clinic review tool, if applicable. No additional management support is needed unless otherwise documented below in the visit note. 

## 2014-09-12 ENCOUNTER — Telehealth: Payer: Self-pay | Admitting: Lab

## 2014-09-12 LAB — CBC WITH DIFFERENTIAL/PLATELET
BASOS PCT: 0.4 % (ref 0.0–3.0)
Basophils Absolute: 0 10*3/uL (ref 0.0–0.1)
EOS PCT: 1.6 % (ref 0.0–5.0)
Eosinophils Absolute: 0.1 10*3/uL (ref 0.0–0.7)
LYMPHS PCT: 11.3 % — AB (ref 12.0–46.0)
Lymphs Abs: 1 10*3/uL (ref 0.7–4.0)
MCHC: 31.7 g/dL (ref 30.0–36.0)
MCV: 88.4 fl (ref 78.0–100.0)
MONOS PCT: 10.1 % (ref 3.0–12.0)
Monocytes Absolute: 0.8 10*3/uL (ref 0.1–1.0)
NEUTROS ABS: 6.5 10*3/uL (ref 1.4–7.7)
NEUTROS PCT: 76.6 % (ref 43.0–77.0)
Platelets: 495 10*3/uL — ABNORMAL HIGH (ref 150.0–400.0)
RBC: 2.65 Mil/uL — ABNORMAL LOW (ref 4.22–5.81)
RDW: 18.3 % — ABNORMAL HIGH (ref 11.5–15.5)
WBC: 8.4 10*3/uL (ref 4.0–10.5)

## 2014-09-15 ENCOUNTER — Ambulatory Visit (HOSPITAL_BASED_OUTPATIENT_CLINIC_OR_DEPARTMENT_OTHER): Payer: Medicare Other

## 2014-09-15 ENCOUNTER — Other Ambulatory Visit (HOSPITAL_BASED_OUTPATIENT_CLINIC_OR_DEPARTMENT_OTHER): Payer: Medicare Other

## 2014-09-15 ENCOUNTER — Ambulatory Visit (HOSPITAL_BASED_OUTPATIENT_CLINIC_OR_DEPARTMENT_OTHER): Payer: Medicare Other | Admitting: Hematology & Oncology

## 2014-09-15 ENCOUNTER — Encounter: Payer: Self-pay | Admitting: Hematology & Oncology

## 2014-09-15 ENCOUNTER — Other Ambulatory Visit: Payer: Self-pay | Admitting: *Deleted

## 2014-09-15 VITALS — BP 138/70 | HR 89 | Temp 98.3°F | Resp 18 | Ht 67.0 in | Wt 155.0 lb

## 2014-09-15 DIAGNOSIS — D649 Anemia, unspecified: Secondary | ICD-10-CM

## 2014-09-15 DIAGNOSIS — N289 Disorder of kidney and ureter, unspecified: Secondary | ICD-10-CM | POA: Diagnosis not present

## 2014-09-15 DIAGNOSIS — D509 Iron deficiency anemia, unspecified: Secondary | ICD-10-CM

## 2014-09-15 DIAGNOSIS — N189 Chronic kidney disease, unspecified: Principal | ICD-10-CM

## 2014-09-15 DIAGNOSIS — K922 Gastrointestinal hemorrhage, unspecified: Secondary | ICD-10-CM

## 2014-09-15 DIAGNOSIS — K254 Chronic or unspecified gastric ulcer with hemorrhage: Secondary | ICD-10-CM

## 2014-09-15 DIAGNOSIS — D500 Iron deficiency anemia secondary to blood loss (chronic): Secondary | ICD-10-CM

## 2014-09-15 DIAGNOSIS — E1165 Type 2 diabetes mellitus with hyperglycemia: Secondary | ICD-10-CM

## 2014-09-15 DIAGNOSIS — Z720 Tobacco use: Secondary | ICD-10-CM

## 2014-09-15 DIAGNOSIS — D631 Anemia in chronic kidney disease: Secondary | ICD-10-CM

## 2014-09-15 DIAGNOSIS — E1140 Type 2 diabetes mellitus with diabetic neuropathy, unspecified: Secondary | ICD-10-CM

## 2014-09-15 DIAGNOSIS — IMO0002 Reserved for concepts with insufficient information to code with codable children: Secondary | ICD-10-CM

## 2014-09-15 LAB — CBC WITH DIFFERENTIAL (CANCER CENTER ONLY)
BASO#: 0 10*3/uL (ref 0.0–0.2)
BASO%: 0.5 % (ref 0.0–2.0)
EOS ABS: 0.2 10*3/uL (ref 0.0–0.5)
EOS%: 3.1 % (ref 0.0–7.0)
HCT: 24.7 % — ABNORMAL LOW (ref 38.7–49.9)
HEMOGLOBIN: 8 g/dL — AB (ref 13.0–17.1)
LYMPH#: 1.1 10*3/uL (ref 0.9–3.3)
LYMPH%: 19.4 % (ref 14.0–48.0)
MCH: 29 pg (ref 28.0–33.4)
MCHC: 32.4 g/dL (ref 32.0–35.9)
MCV: 90 fL (ref 82–98)
MONO#: 0.8 10*3/uL (ref 0.1–0.9)
MONO%: 14.6 % — ABNORMAL HIGH (ref 0.0–13.0)
NEUT#: 3.6 10*3/uL (ref 1.5–6.5)
NEUT%: 62.4 % (ref 40.0–80.0)
Platelets: 409 10*3/uL — ABNORMAL HIGH (ref 145–400)
RBC: 2.76 10*6/uL — AB (ref 4.20–5.70)
RDW: 18.1 % — ABNORMAL HIGH (ref 11.1–15.7)
WBC: 5.8 10*3/uL (ref 4.0–10.0)

## 2014-09-15 LAB — COMPREHENSIVE METABOLIC PANEL
ALT: 8 U/L (ref 0–53)
AST: 9 U/L (ref 0–37)
Albumin: 2.5 g/dL — ABNORMAL LOW (ref 3.5–5.2)
Alkaline Phosphatase: 108 U/L (ref 39–117)
BUN: 19 mg/dL (ref 6–23)
CO2: 25 mEq/L (ref 19–32)
Calcium: 8 mg/dL — ABNORMAL LOW (ref 8.4–10.5)
Chloride: 104 mEq/L (ref 96–112)
Creatinine, Ser: 2.2 mg/dL — ABNORMAL HIGH (ref 0.50–1.35)
GLUCOSE: 249 mg/dL — AB (ref 70–99)
Potassium: 4.2 mEq/L (ref 3.5–5.3)
SODIUM: 139 meq/L (ref 135–145)
Total Bilirubin: 0.3 mg/dL (ref 0.2–1.2)
Total Protein: 5.5 g/dL — ABNORMAL LOW (ref 6.0–8.3)

## 2014-09-15 MED ORDER — FERUMOXYTOL INJECTION 510 MG/17 ML: 510.0000 mg | Freq: Once | INTRAVENOUS | Status: DC

## 2014-09-15 MED ORDER — GABAPENTIN 300 MG PO CAPS: ORAL_CAPSULE | ORAL | Status: DC

## 2014-09-15 MED ORDER — SODIUM CHLORIDE 0.9 % IV SOLN
510.0000 mg | Freq: Once | INTRAVENOUS | Status: AC
  Administered 2014-09-15: 510 mg via INTRAVENOUS
  Filled 2014-09-15: qty 17

## 2014-09-15 NOTE — Progress Notes (Signed)
Hematology and Oncology Follow Up Visit  [name] [id number] [birth year] 69 y.o. 09/15/2014   Principle Diagnosis:  1. Iron-deficiency anemia secondary to recurrent gastrointestinal bleeding. 2. 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  Current Therapy:   1. Aranesp 300 mcg subcu as needed for hemoglobin less than 10 2. IV iron as indicated    Interim History:  [name] is here today for a follow-up. He is feeling fatigued. We last saw him about.2 weeks ago.  He got iron and Aranesp. I am surprised that his blood count does not come up a little bit more.  He is complaining of some weakness over the right thigh. He does ride horses. He has some numbness in the lateral right thigh. I wonder if he has lateral femoral cutaneous nerve compression.  He still smoking about half a pack per day.  He's had no obvious melena or bright red blood per rectum.  He's had no fever. He said no rashes. He's had no shortness of breath. Medications:    Medication List       This list is accurate as of: 09/15/14 10:00 AM.  Always use your most recent med list.               aspirin EC 81 MG tablet  Take 81 mg by mouth daily.     atorvastatin 80 MG tablet  Commonly known as:  LIPITOR  Take 1 tablet (80 mg total) by mouth daily.     CVS VITAMIN D 2000 UNITS Caps  Generic drug:  Cholecalciferol  Take 1 capsule by mouth daily.     ezetimibe 10 MG tablet  Commonly known as:  ZETIA  Take 1 tablet (10 mg total) by mouth daily.     gabapentin 300 MG capsule  Commonly known as:  NEURONTIN  TAKE 1 CAPSULE (300 MG TOTAL) BY MOUTH AT BEDTIME.     hydrALAZINE 10 MG tablet  Commonly known as:  APRESOLINE  TAKE 1 TABLET BY MOUTH EVERY 8 HOURS     Insulin Detemir 100 UNIT/ML Pen  Commonly known as:  LEVEMIR  Inject 10 Units into the skin daily at 10 pm.     insulin lispro 100 UNIT/ML injection  Commonly known as:  HUMALOG  USE AS ADVISED - MAXIMUM 25 UNITS PER  DAY. INJECT 7-7-5 UNITS PLUS SSI AS ADVISED.     Insulin Pen Needle 31G X 8 MM Misc  Commonly known as:  B-D ULTRAFINE III SHORT PEN  USE AS DIRECTED WITH INSULIN Dx: 250.02     lisinopril 10 MG tablet  Commonly known as:  PRINIVIL,ZESTRIL  TAKE 1 TABLET (10 MG TOTAL) BY MOUTH DAILY.     NEXIUM 40 MG capsule  Generic drug:  esomeprazole  TAKE 1 CAPSULE (40 MG TOTAL) BY MOUTH DAILY AT 12 NOON.     nitroGLYCERIN 0.4 MG SL tablet  Commonly known as:  NITROSTAT  Place 1 tablet (0.4 mg total) under the tongue every 5 (five) minutes as needed for chest pain (up to 3 doses).     pantoprazole 40 MG tablet  Commonly known as:  PROTONIX  Take 1 tablet (40 mg total) by mouth daily.     polyvinyl alcohol 1.4 % ophthalmic solution  Commonly known as:  LIQUIFILM TEARS  Place 1 drop into both eyes daily as needed (dry eyes).     potassium chloride SA 20 MEQ tablet  Commonly known as:  KLOR-CON M20  Take 2  tablets (40 mEq total) by mouth once. Take 2 pills a day     traMADol 50 MG tablet  Commonly known as:  ULTRAM  Take 1 tablet (50 mg total) by mouth every 8 (eight) hours as needed.        Allergies: No Known Allergies  Past Medical History, Surgical history, Social history, and Family History were reviewed and updated.  Review of Systems: All other 10 point review of systems is negative.   Physical Exam:  height is 5\' 7"  (1.702 m) and weight is 155 lb (70.308 kg). His oral temperature is 98.3 F (36.8 C). His blood pressure is 138/70 and his pulse is 89. His respiration is 18.   Wt Readings from Last 3 Encounters:  09/15/14 155 lb (70.308 kg)  09/11/14 154 lb 4 oz (69.967 kg)  09/05/14 155 lb (70.308 kg)    Ocular: Sclerae unicteric, pupils equal, round and reactive to light Ear-nose-throat: Oropharynx clear, dentition fair Lymphatic: No cervical or supraclavicular adenopathy Lungs no rales or rhonchi, good excursion bilaterally Heart regular rate and rhythm, no murmur  appreciated Abd soft, nontender, positive bowel sounds MSK no focal spinal tenderness, no joint edema Neuro: non-focal, well-oriented, appropriate affect  Lab Results  Component Value Date   WBC 5.8 09/15/2014   HGB 8.0* 09/15/2014   HCT 24.7* 09/15/2014   MCV 90 09/15/2014   PLT 409* 09/15/2014   Lab Results  Component Value Date   FERRITIN 17* 09/05/2014   IRON 35* 09/05/2014   TIBC 249 09/05/2014   UIBC 214 09/05/2014   IRONPCTSAT 14* 09/05/2014   Lab Results  Component Value Date   RETICCTPCT 2.6* 09/05/2014   RBC 2.76* 09/15/2014   RETICCTABS 65.8 09/05/2014   No results found for: KPAFRELGTCHN, LAMBDASER, KAPLAMBRATIO No results found for: IGGSERUM, IGA, IGMSERUM No results found for: Odetta Pink, SPEI   Chemistry      Component Value Date/Time   NA 138 09/05/2014 1109   NA 139 07/27/2014 0821   K 4.5 09/05/2014 1109   K 4.0 07/27/2014 0821   CL 108 09/05/2014 1109   CL 106 07/27/2014 0821   CO2 24 09/05/2014 1109   CO2 26 07/27/2014 0821   BUN 23 09/05/2014 1109   BUN 18 07/27/2014 0821   CREATININE 2.13* 09/05/2014 1109   CREATININE 2.1* 07/27/2014 0821      Component Value Date/Time   CALCIUM 8.0* 09/05/2014 1109   CALCIUM 9.2 07/27/2014 0821   ALKPHOS 102 09/05/2014 1109   ALKPHOS 117* 07/27/2014 0821   AST 9 09/05/2014 1109   AST 16 07/27/2014 0821   ALT 8 09/05/2014 1109   ALT 16 07/27/2014 0821   BILITOT 0.2 09/05/2014 1109   BILITOT 0.60 07/27/2014 0821     Impression and Plan: [name] is 69 year old gentleman with multifactorial anemia (iron deficiency/renal insufficiency).   We will go ahead and give him iron today. I'm sure that his iron level is probably on the low side.  He just got Aranesp about 2 weeks ago so I don't think we need to give any Aranesp.  I still suspect diabetes has been a big problem for him.  The weakness in his right leg and the numbness in the lateral  right thigh I think is more neurologic than anything else.  He has had back surgery. He sees a neurologist in the next month or so.  We will plan to get him back in 3-4 weeks.  Volanda Napoleon, MD 7/1/201610:00 AM

## 2014-09-15 NOTE — Patient Instructions (Signed)

## 2014-09-20 ENCOUNTER — Telehealth: Payer: Self-pay | Admitting: Family

## 2014-09-20 NOTE — Telephone Encounter (Signed)
Caller name: loretta Relation to pt: wife Call back number: [phone] Pharmacy:  Reason for call:   Patient wife calling in regarding cardiology referral. Her and patient are wanting to know why patient is being referred to cardiology?

## 2014-09-20 NOTE — Telephone Encounter (Signed)
Left detailed message on spouse's voicemail that cardiology referral was placed at ER follow up of chest pain and due to pt's history of CAD and hasn't seen cardiology since 2014. Advised her to call back if any further questions.

## 2014-09-21 ENCOUNTER — Encounter: Payer: Self-pay | Admitting: Nurse Practitioner

## 2014-09-21 ENCOUNTER — Ambulatory Visit (INDEPENDENT_AMBULATORY_CARE_PROVIDER_SITE_OTHER): Payer: Medicare Other | Admitting: Nurse Practitioner

## 2014-09-21 VITALS — BP 130/70 | HR 88 | Ht 66.75 in | Wt 154.1 lb

## 2014-09-21 DIAGNOSIS — I2510 Atherosclerotic heart disease of native coronary artery without angina pectoris: Secondary | ICD-10-CM | POA: Diagnosis not present

## 2014-09-21 DIAGNOSIS — D509 Iron deficiency anemia, unspecified: Secondary | ICD-10-CM

## 2014-09-21 NOTE — Progress Notes (Signed)
     History of Present Illness:   Patient is a 69 year old male known to Dr. Ardis Hughs. He has a history of recurrent iron deficiency anemia. He had an EGD and colonoscopy in 2013 in [facility] for evaluation of anemia. EGD unremarkable except for a hiatal hernia. Colonoscopy significant for 7 mm adenomatous polyp. For further evaluation of iron deficiency anemia patient had capsule endoscopy which was read as " jejunal ulcerations felt to be secondary to NSAIDS".   Patient was hospitalized in [facility] December 2014 with a GI bleed. Enteroscopy done by Dr. Deatra Ina (covering the hospital) showed multiple AVMs in the proximal jejunum, second and third portions of the duodenum. Following that, in early 2015 patient was evaluated by Dr.Gilliam at [facility] who felt small intestinal AVMs were definitely the source of bleeding. Patient has since been under the care of Hematology for "mult-factorial anemia". He gets IV iron every month and also on Aranesp.    Current Medications, Allergies, Past Medical History, Past Surgical History, Family History and Social History were reviewed in [employer] record.  Physical Exam: General: Pleasant, black male in no acute distress Head: Normocephalic and atraumatic Eyes:  sclerae anicteric, conjunctiva pink  Ears: Normal auditory acuity Lungs: Clear throughout to auscultation Heart: Regular rate and rhythm Abdomen: Soft, non distended, non-tender. No masses, no hepatomegaly. Normal bowel sounds Musculoskeletal: Symmetrical with no gross deformities  Extremities: No edema  Neurological: Alert oriented x 4, grossly nonfocal Psychological:  Alert and cooperative. Normal mood and affect  Assessment and Recommendations:  12. 69 year old male with chronic multifactorial anemia. He likely has chronic GI blood loss from known intestinal AVMs but also anemia of chronic disease. Followed by Hematology, gets Aranesp and IV iron on regular basis.  Recent decline in hgb - 6.9 down from baseline of 8-10.  No overt GI bleeding, heme negative at PCP office. After IV iron hgb up to 8.0 on 09/15/14.   Will talk with patient's primary GI, Dr. Ardis Hughs. Patient may need repeat enteroscopy with ablation but I don't think he needs any workup beyond that as the source of chronic GI blood loss has already been established. Will let patient know.

## 2014-09-21 NOTE — Patient Instructions (Signed)
We will contact you with a time and date for an Enteroscopy.

## 2014-09-25 ENCOUNTER — Encounter: Payer: Self-pay | Admitting: Family

## 2014-09-25 ENCOUNTER — Ambulatory Visit (INDEPENDENT_AMBULATORY_CARE_PROVIDER_SITE_OTHER): Payer: Medicare Other | Admitting: Family

## 2014-09-25 ENCOUNTER — Telehealth: Payer: Self-pay | Admitting: Family

## 2014-09-25 VITALS — BP 130/80 | HR 97 | Temp 97.9°F | Resp 16 | Ht 67.0 in | Wt 156.4 lb

## 2014-09-25 DIAGNOSIS — E1165 Type 2 diabetes mellitus with hyperglycemia: Secondary | ICD-10-CM | POA: Diagnosis not present

## 2014-09-25 DIAGNOSIS — D509 Iron deficiency anemia, unspecified: Secondary | ICD-10-CM

## 2014-09-25 DIAGNOSIS — I10 Essential (primary) hypertension: Secondary | ICD-10-CM | POA: Diagnosis not present

## 2014-09-25 DIAGNOSIS — I2510 Atherosclerotic heart disease of native coronary artery without angina pectoris: Secondary | ICD-10-CM

## 2014-09-25 DIAGNOSIS — E785 Hyperlipidemia, unspecified: Secondary | ICD-10-CM

## 2014-09-25 DIAGNOSIS — H8110 Benign paroxysmal vertigo, unspecified ear: Secondary | ICD-10-CM | POA: Diagnosis not present

## 2014-09-25 DIAGNOSIS — E1140 Type 2 diabetes mellitus with diabetic neuropathy, unspecified: Secondary | ICD-10-CM | POA: Diagnosis not present

## 2014-09-25 DIAGNOSIS — IMO0002 Reserved for concepts with insufficient information to code with codable children: Secondary | ICD-10-CM

## 2014-09-25 LAB — BASIC METABOLIC PANEL
BUN: 28 mg/dL — ABNORMAL HIGH (ref 6–23)
CO2: 25 mEq/L (ref 19–32)
CREATININE: 2.38 mg/dL — AB (ref 0.40–1.50)
Calcium: 8.5 mg/dL (ref 8.4–10.5)
Chloride: 106 mEq/L (ref 96–112)
GFR: 35.15 mL/min — ABNORMAL LOW (ref >60.00)
GLUCOSE: 283 mg/dL — AB (ref 70–99)
Potassium: 4.1 mEq/L (ref 3.5–5.1)
Sodium: 135 mEq/L (ref 135–145)

## 2014-09-25 LAB — LIPID PANEL
Cholesterol: 207 mg/dL — ABNORMAL HIGH (ref 0–200)
HDL: 29.9 mg/dL — ABNORMAL LOW (ref >39.00)
NonHDL: 177.1
Total CHOL/HDL Ratio: 7
Triglycerides: 212 mg/dL — ABNORMAL HIGH (ref 0.0–149.0)
VLDL: 42.4 mg/dL — ABNORMAL HIGH (ref 0.0–40.0)

## 2014-09-25 LAB — HEMOGLOBIN A1C: HEMOGLOBIN A1C: 7.7 % — AB (ref 4.6–6.5)

## 2014-09-25 LAB — LDL CHOLESTEROL, DIRECT: LDL DIRECT: 117 mg/dL

## 2014-09-25 NOTE — Progress Notes (Signed)
Subjective:    Patient ID: [name], male    DOB: Aug 01, 1945, 69 y.o.   MRN: [id number]  HPI  [name] is a 69 yr old male who presents today for follow up.   Patient presents today for follow up of multiple medical problems.  Diabetes Type 2  Pt is currently maintained on the following medications for diabetes: humalog tid AC meals per sliding scale and levemir 12 units HS  Lab Results  Component Value Date   HGBA1C 9.4* 06/23/2014   HGBA1C 9.5* 02/03/2014   HGBA1C 10.6* 09/07/2013    Lab Results  Component Value Date   MICROALBUR 1089.9* 02/03/2014   LDLCALC 124* 02/03/2014   CREATININE 2.20* 09/15/2014    Last diabetic eye exam was:  Up to date Denies polyuria/polydipsia. Reports that he has hypoglycemia often after he uses his sliding scale as low as 50. Reports that he has been taking insulin 1 hour after eating. terday, occasionally higher.    Hyperlipidemia  Patient is currently maintained on the following medication for hyperlipidemia: zetia, lipitor Last lipid panel as follows:  Lab Results  Component Value Date   CHOL 229* 06/23/2014   HDL 31.30* 06/23/2014   LDLCALC 124* 02/03/2014   LDLDIRECT 133.0 06/23/2014   TRIG 209.0* 06/23/2014   CHOLHDL 7 06/23/2014   Patient denies myalgia. Patient reports good compliance with low fat/low cholesterol diet.   Hypertension  Patient is currently maintained on the following medications for blood pressure: hydralazine, lisinopril Patient reports good compliance with blood pressure medications. Patient denies chest pain, shortness of breath. Does have some ankle swelling.  Last 3 blood pressure readings in our office are as follows. BP Readings from Last 3 Encounters:  09/25/14 130/80  09/21/14 130/70  09/15/14 138/70    Dizziness-  Started 4 days ago. Only dizzy if he turns his head.  Reports that he went to ENT (Dr Laurance Flatten) last Thursday and had cerumen removal from the right ear.    Iron def Anemia-  He is following with GI and hematology for Aranesp and IV iron.  Lab Results  Component Value Date   WBC 5.8 09/15/2014   HGB 8.0* 09/15/2014   HCT 24.7* 09/15/2014   MCV 90 09/15/2014   PLT 409* 09/15/2014       Review of Systems See HPI  Past Medical History  Diagnosis Date  . Hypertension   . High cholesterol   . GERD (gastroesophageal reflux disease)   . GI bleed     a. Recurrent GI bleed, tx with IV iron. b. Per heme notes - likely AVMs 04/2012 (tx with cauterization several months ago).  . Orthostatic hypotension     a. Tx with florinef.  . Hematuria     a. Urology note scan from 07/2012: cystoscopy without evidence for bladder lesion, only lateral hypertrophy of posterior urethra, bladder impression from BPH. b. Pt states he had "some tests" scheduled for later in July 2014.  . Stage III chronic kidney disease     a. Stage 3 (DM with complications ->CKD, peripheral neuropathy).  . Anemia, iron deficiency 03/24/2011    a. Recurrent GI bleed, tx with periodic iron infusions.  . 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 Sleep apnea     "had mask; couldn't sleep in it" (09/20/2012)  . Type II diabetes mellitus     a. Dx 1994, uncontrolled.   . Diabetic peripheral neuropathy 2014    foot pain.  . Chronic lower back  pain   . Fatty liver     on CT of 11/2010  . AVM (arteriovenous malformation) of duodenum, acquired     egds in 01/2012, 04/2011  . Polyp, colonic     Colonoscopy 01/2012 "benign" polyp  . CAD (coronary artery disease)     a. Cath 09/2012: moderate borderline CAD in mid LAD/small diagonal branch, mild RCA stenosis, to be managed medically   . BPH (benign prostatic hyperplasia)   . Prostate cancer   . Stroke   . Peripheral neuropathy   . 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 Marital Status: Married    Spouse Name: N/A  . Number of Children: 2  . Years of Education: N/A   Occupational History  . taken out of work  due to back    Social History Main Topics  . Smoking status: Current Some Day Smoker -- 1.00 packs/day for 43 years    Types: Cigarettes    Start date: 04/15/1968  . Smokeless tobacco: Never Used     Comment: 05-12--2016  still smoking  . Alcohol Use: No     Comment: 09/20/2012 "Used to; stopped ~ 2009; never had problem w/it"  . Drug Use: No  . Sexual Activity: No   Other Topics Concern  . Not on file   Social History Narrative   Regular exercise: rides horses   Caffeine use: occasionally    Past Surgical History  Procedure Laterality Date  . Shoulder open rotator cuff repair Left 1980's  . Inguinal hernia repair Right 2011  . Lumbar disc surgery  1980's  . Robot assisted laparoscopic radical prostatectomy N/A 01/19/2013    Procedure: ROBOTIC ASSISTED LAPAROSCOPIC RADICAL PROSTATECTOMY;  Surgeon: Bernestine Amass, MD;  Location: [facility];  Service: Urology;  Laterality: N/A;  . Lymphadenectomy Bilateral 01/19/2013    Procedure: LYMPHADENECTOMY;  Surgeon: Bernestine Amass, MD;  Location: [facility];  Service: Urology;  Laterality: Bilateral;  . Enteroscopy N/A 03/01/2013    Procedure: ENTEROSCOPY;  Surgeon: Inda Castle, MD;  Location: [facility];  Service: Endoscopy;  Laterality: N/A;  . Left heart catheterization with coronary angiogram N/A 09/21/2012    Procedure: LEFT HEART CATHETERIZATION WITH CORONARY ANGIOGRAM;  Surgeon: Peter M Martinique, MD;  Location: [facility] CATH LAB;  Service: Cardiovascular;  Laterality: N/A;    Family History  Problem Relation Age of Onset  . Heart attack Brother     Died at 70  . Stroke Father     Died at 38  . Diabetes Sister   . Diabetes Mother   . Stomach cancer Brother   . Heart attack Sister     No Known Allergies  Current Outpatient Prescriptions on File Prior to Visit  Medication Sig Dispense Refill  . amLODipine (NORVASC) 5 MG tablet Take 1 tablet by mouth daily.    [name] Kitchen aspirin EC 81 MG tablet Take 81 mg by mouth daily.    . [facility] VITAMIN D 2000  UNITS CAPS Take 1 capsule by mouth daily.  11  . ezetimibe (ZETIA) 10 MG tablet Take 1 tablet (10 mg total) by mouth daily. 30 tablet 3  . gabapentin (NEURONTIN) 300 MG capsule TAKE 1 CAPSULE (300 MG TOTAL) BY MOUTH AT BEDTIME. 30 capsule 0  . hydrALAZINE (APRESOLINE) 10 MG tablet TAKE 1 TABLET BY MOUTH EVERY 8 HOURS 90 tablet 2  . Insulin Detemir (LEVEMIR) 100 UNIT/ML Pen Inject 10 Units into the skin daily at 10  pm. (Patient taking differently: Inject 12 Units into the skin daily at 10 pm. ) 15 mL 5  . insulin lispro (HUMALOG) 100 UNIT/ML injection USE AS ADVISED - MAXIMUM 25 UNITS PER DAY. INJECT 7-7-5 UNITS PLUS SSI AS ADVISED. (Patient taking differently: Inject into the skin 3 (three) times daily with meals. USE AS ADVISED - MAXIMUM 25 UNITS PER DAY. INJECT 7---- 25 UNITS PLUS SSI AS ADVISED.) 10 mL 0  . Insulin Pen Needle (B-D ULTRAFINE III SHORT PEN) 31G X 8 MM MISC USE AS DIRECTED WITH INSULIN Dx: 250.02 100 each 5  . lisinopril (PRINIVIL,ZESTRIL) 10 MG tablet TAKE 1 TABLET (10 MG TOTAL) BY MOUTH DAILY. 30 tablet 1  . NEXIUM 40 MG capsule TAKE 1 CAPSULE (40 MG TOTAL) BY MOUTH DAILY AT 12 NOON. 30 capsule 8  . nitroGLYCERIN (NITROSTAT) 0.4 MG SL tablet Place 1 tablet (0.4 mg total) under the tongue every 5 (five) minutes as needed for chest pain (up to 3 doses). 25 tablet 2  . pantoprazole (PROTONIX) 40 MG tablet Take 1 tablet (40 mg total) by mouth daily. 30 tablet 3  . polyvinyl alcohol (LIQUIFILM TEARS) 1.4 % ophthalmic solution Place 1 drop into both eyes daily as needed (dry eyes).    . potassium chloride SA (KLOR-CON M20) 20 MEQ tablet Take 2 tablets (40 mEq total) by mouth once. Take 2 pills a day 60 tablet 6  . traMADol (ULTRAM) 50 MG tablet Take 1 tablet (50 mg total) by mouth every 8 (eight) hours as needed. 30 tablet 0   No current facility-administered medications on file prior to visit.    BP 130/80 mmHg  Pulse 97  Temp(Src) 97.9 F (36.6 C) (Oral)  Resp 16  Ht 5\' 7"   (1.702 m)  Wt 156 lb 6.4 oz (70.943 kg)  BMI 24.49 kg/m2  SpO2 99%       Objective:   Physical Exam  Constitutional: He is oriented to person, place, and time. He appears well-developed and well-nourished. No distress.  HENT:  Head: Normocephalic and atraumatic.  Cardiovascular: Normal rate and regular rhythm.   No murmur heard. Pulmonary/Chest: Effort normal and breath sounds normal. No respiratory distress. He has no wheezes. He has no rales.  Musculoskeletal: He exhibits no edema.  Neurological: He is alert and oriented to person, place, and time.  Skin: Skin is warm and dry.  Psychiatric: He has a normal mood and affect. His behavior is normal. Thought content normal.          Assessment & Plan:

## 2014-09-25 NOTE — Telephone Encounter (Signed)
cholesterol is above goal. Is he taking lipitor every day?  If so, need to d/c lipitor and start crestor 20mg  once daily, repeat lipids in 6 weeks dx hyperlipidemia. Kidney function is stable.

## 2014-09-25 NOTE — Progress Notes (Signed)
Pre visit review using our clinic review tool, if applicable. No additional management support is needed unless otherwise documented below in the visit note. 

## 2014-09-25 NOTE — Patient Instructions (Addendum)
Please complete lab work prior to leaving. Please contact Endocrinology to arrange a follow up visit. [phone] Please schedule a follow up appointment in 3 months.

## 2014-09-25 NOTE — Progress Notes (Signed)
Lets get a repeat CBC this week and then decide about further procedures.  Thanks

## 2014-09-26 DIAGNOSIS — H8110 Benign paroxysmal vertigo, unspecified ear: Secondary | ICD-10-CM | POA: Insufficient documentation

## 2014-09-26 MED ORDER — MECLIZINE HCL 25 MG PO TABS: 25.0000 mg | ORAL_TABLET | Freq: Three times a day (TID) | ORAL | Status: DC | PRN

## 2014-09-26 MED ORDER — ROSUVASTATIN CALCIUM 20 MG PO TABS: 20.0000 mg | ORAL_TABLET | Freq: Every day | ORAL | Status: DC

## 2014-09-26 NOTE — Assessment & Plan Note (Signed)
Uncontrolled, obtain flp.

## 2014-09-26 NOTE — Assessment & Plan Note (Signed)
Uncontrolled.  I advised pt to take humalog AC meals not 1 hour after as he has been doing.  Advised pt to follow up with endo.  Obtain a1c.

## 2014-09-26 NOTE — Telephone Encounter (Signed)
Per verbal from PCP, ok to start meclizine 25mg  three times daily as needed for dizziness that he mentioned at recent visit and to let us know if symptoms do not improve.  Also see note below.  Left message for pt to return my call.

## 2014-09-26 NOTE — Telephone Encounter (Signed)
Pt returning your call. Please call back at [phone].

## 2014-09-26 NOTE — Telephone Encounter (Signed)
Pt called back. States he has medicare and his cost for Crestor will be $130. He is unable to afford this and wants to know of a cheaper alternative. Please advise.

## 2014-09-26 NOTE — Assessment & Plan Note (Signed)
Defer management to hematology.

## 2014-09-26 NOTE — Assessment & Plan Note (Signed)
Trial of prn meclizine, see phone note.

## 2014-09-26 NOTE — Telephone Encounter (Signed)
Notified pt and he voices understanding. Rxs sent to pharmacy. Lab appt scheduled for 11/07/14 at 8am and future lab order entered.

## 2014-09-26 NOTE — Assessment & Plan Note (Signed)
BP stable on current meds, continue same.  

## 2014-09-27 ENCOUNTER — Telehealth: Payer: Self-pay

## 2014-09-27 DIAGNOSIS — D649 Anemia, unspecified: Secondary | ICD-10-CM

## 2014-09-27 NOTE — Telephone Encounter (Signed)
Pt has been notified to come in for labs

## 2014-09-27 NOTE — Telephone Encounter (Signed)
Left message for pt to return my call.

## 2014-09-27 NOTE — Telephone Encounter (Signed)
Unfortunately, there is not cheaper alternative.  If he cannot afford crestor, then I recommend that he restart atorvastatin 80 mg once daily + add zetia 10mg  po once daily please.  I am not sure how expensive the zetia will be for him. Let me know if it is too expensive.

## 2014-09-28 ENCOUNTER — Other Ambulatory Visit (INDEPENDENT_AMBULATORY_CARE_PROVIDER_SITE_OTHER): Payer: Medicare Other

## 2014-09-28 DIAGNOSIS — D649 Anemia, unspecified: Secondary | ICD-10-CM | POA: Diagnosis not present

## 2014-09-28 LAB — CBC WITH DIFFERENTIAL/PLATELET
Basophils Absolute: 0 10*3/uL (ref 0.0–0.1)
Basophils Relative: 0.6 % (ref 0.0–3.0)
EOS PCT: 1.7 % (ref 0.0–5.0)
Eosinophils Absolute: 0.1 10*3/uL (ref 0.0–0.7)
HEMATOCRIT: 27 % — AB (ref 39.0–52.0)
HEMOGLOBIN: 8.9 g/dL — AB (ref 13.0–17.0)
LYMPHS ABS: 0.8 10*3/uL (ref 0.7–4.0)
Lymphocytes Relative: 15.1 % (ref 12.0–46.0)
MCHC: 33.1 g/dL (ref 30.0–36.0)
MCV: 90.7 fl (ref 78.0–100.0)
MONO ABS: 0.6 10*3/uL (ref 0.1–1.0)
Monocytes Relative: 11.5 % (ref 3.0–12.0)
NEUTROS PCT: 71.1 % (ref 43.0–77.0)
Neutro Abs: 4 10*3/uL (ref 1.4–7.7)
Platelets: 312 10*3/uL (ref 150.0–400.0)
RBC: 2.98 Mil/uL — ABNORMAL LOW (ref 4.22–5.81)
RDW: 21.5 % — AB (ref 11.5–15.5)
WBC: 5.6 10*3/uL (ref 4.0–10.5)

## 2014-09-28 NOTE — Telephone Encounter (Signed)
Spoke with jennifer at [facility] verified Crestor was stopped, still has atorvstatin on file and pt filled on 09-10-14.

## 2014-09-28 NOTE — Telephone Encounter (Signed)
Notified pt and he voices understanding. 

## 2014-10-02 ENCOUNTER — Other Ambulatory Visit: Payer: Self-pay

## 2014-10-02 DIAGNOSIS — D649 Anemia, unspecified: Secondary | ICD-10-CM

## 2014-10-06 ENCOUNTER — Other Ambulatory Visit (HOSPITAL_BASED_OUTPATIENT_CLINIC_OR_DEPARTMENT_OTHER): Payer: Medicare Other

## 2014-10-06 ENCOUNTER — Encounter: Payer: Self-pay | Admitting: Family

## 2014-10-06 ENCOUNTER — Ambulatory Visit (HOSPITAL_BASED_OUTPATIENT_CLINIC_OR_DEPARTMENT_OTHER): Payer: Medicare Other | Admitting: Family

## 2014-10-06 ENCOUNTER — Ambulatory Visit (HOSPITAL_BASED_OUTPATIENT_CLINIC_OR_DEPARTMENT_OTHER): Payer: Medicare Other

## 2014-10-06 VITALS — BP 129/71 | HR 85 | Temp 98.2°F | Resp 20 | Ht 67.0 in | Wt 155.0 lb

## 2014-10-06 DIAGNOSIS — D631 Anemia in chronic kidney disease: Secondary | ICD-10-CM

## 2014-10-06 DIAGNOSIS — K922 Gastrointestinal hemorrhage, unspecified: Secondary | ICD-10-CM | POA: Diagnosis not present

## 2014-10-06 DIAGNOSIS — D500 Iron deficiency anemia secondary to blood loss (chronic): Secondary | ICD-10-CM

## 2014-10-06 DIAGNOSIS — D649 Anemia, unspecified: Secondary | ICD-10-CM | POA: Diagnosis not present

## 2014-10-06 DIAGNOSIS — N289 Disorder of kidney and ureter, unspecified: Secondary | ICD-10-CM

## 2014-10-06 DIAGNOSIS — E1165 Type 2 diabetes mellitus with hyperglycemia: Secondary | ICD-10-CM

## 2014-10-06 DIAGNOSIS — N189 Chronic kidney disease, unspecified: Secondary | ICD-10-CM

## 2014-10-06 DIAGNOSIS — D509 Iron deficiency anemia, unspecified: Secondary | ICD-10-CM

## 2014-10-06 DIAGNOSIS — K254 Chronic or unspecified gastric ulcer with hemorrhage: Secondary | ICD-10-CM

## 2014-10-06 LAB — CBC WITH DIFFERENTIAL (CANCER CENTER ONLY)
BASO#: 0 10*3/uL (ref 0.0–0.2)
BASO%: 0.7 % (ref 0.0–2.0)
EOS ABS: 0.1 10*3/uL (ref 0.0–0.5)
EOS%: 2 % (ref 0.0–7.0)
HCT: 27.4 % — ABNORMAL LOW (ref 38.7–49.9)
HGB: 9.3 g/dL — ABNORMAL LOW (ref 13.0–17.1)
LYMPH#: 0.8 10*3/uL — ABNORMAL LOW (ref 0.9–3.3)
LYMPH%: 14.3 % (ref 14.0–48.0)
MCH: 30.5 pg (ref 28.0–33.4)
MCHC: 33.9 g/dL (ref 32.0–35.9)
MCV: 90 fL (ref 82–98)
MONO#: 0.7 10*3/uL (ref 0.1–0.9)
MONO%: 11.5 % (ref 0.0–13.0)
NEUT#: 4.2 10*3/uL (ref 1.5–6.5)
NEUT%: 71.5 % (ref 40.0–80.0)
PLATELETS: 302 10*3/uL (ref 145–400)
RBC: 3.05 10*6/uL — ABNORMAL LOW (ref 4.20–5.70)
RDW: 16.1 % — ABNORMAL HIGH (ref 11.1–15.7)
WBC: 5.9 10*3/uL (ref 4.0–10.0)

## 2014-10-06 LAB — IRON AND TIBC CHCC
%SAT: 37 % (ref 20–55)
Iron: 75 ug/dL (ref 42–163)
TIBC: 205 ug/dL (ref 202–409)
UIBC: 130 ug/dL (ref 117–376)

## 2014-10-06 LAB — COMPREHENSIVE METABOLIC PANEL (CC13)
ALT: 11 U/L (ref 0–55)
ANION GAP: 6 meq/L (ref 3–11)
AST: 13 U/L (ref 5–34)
Albumin: 2.2 g/dL — ABNORMAL LOW (ref 3.5–5.0)
Alkaline Phosphatase: 111 U/L (ref 40–150)
BILIRUBIN TOTAL: 0.36 mg/dL (ref 0.20–1.20)
BUN: 24.8 mg/dL (ref 7.0–26.0)
CO2: 25 meq/L (ref 22–29)
Calcium: 8.5 mg/dL (ref 8.4–10.4)
Chloride: 109 mEq/L (ref 98–109)
Creatinine: 2.5 mg/dL — ABNORMAL HIGH (ref 0.7–1.3)
EGFR: 29 mL/min/{1.73_m2} — AB (ref >90)
GLUCOSE: 191 mg/dL — AB (ref 70–140)
POTASSIUM: 4.1 meq/L (ref 3.5–5.1)
Sodium: 139 mEq/L (ref 136–145)
Total Protein: 6 g/dL — ABNORMAL LOW (ref 6.4–8.3)

## 2014-10-06 LAB — RETICULOCYTES (CHCC)
ABS RETIC: 122.5 10*3/uL (ref 19.0–186.0)
RBC.: 3.14 MIL/uL — AB (ref 4.22–5.81)
RETIC CT PCT: 3.9 % — AB (ref 0.4–2.3)

## 2014-10-06 LAB — FERRITIN CHCC: Ferritin: 118 ng/ml (ref 22–316)

## 2014-10-06 MED ORDER — DARBEPOETIN ALFA 300 MCG/0.6ML IJ SOSY
PREFILLED_SYRINGE | INTRAMUSCULAR | Status: AC
  Filled 2014-10-06: qty 0.6

## 2014-10-06 MED ORDER — DARBEPOETIN ALFA 300 MCG/0.6ML IJ SOSY
300.0000 ug | PREFILLED_SYRINGE | Freq: Once | INTRAMUSCULAR | Status: AC
  Administered 2014-10-06: 300 ug via SUBCUTANEOUS

## 2014-10-06 NOTE — Progress Notes (Signed)
Hematology and Oncology Follow Up Visit  [name] [id number] [birth year] 69 y.o. 10/06/2014   Principle Diagnosis:  1. Iron-deficiency anemia secondary to recurrent gastrointestinal bleeding. 2. 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  Current Therapy:   1. Aranesp 300 mcg subcu as needed for hemoglobin less than 10 2. IV iron as indicated    Interim History:  [name] is here today for a follow-up. He is feeling much better since getting Feraheme a few weeks ago. His symptoms have resolved.  His Hgb is now up to 9.3 with an MCV of 90. We will give him a dose of Aranesp today.  His blood sugars still continue to fluctuate. He is on daily insulin.  He denies fever, chills, n/v, cough, rash, blurred vision, SOB, chest pain, palpitations, abdominal pain, constipation or diarrhea. No blood in his urine or stool.  He still has some occasional ankle swelling that comes and goes. The numbness and tingling in his feet is unchanged. He is eating well and staying hydrated. His weight is stable. He is still smoking daily.  Medications:    Medication List       This list is accurate as of: 10/06/14 11:20 AM.  Always use your most recent med list.               amLODipine 5 MG tablet  Commonly known as:  NORVASC  Take 1 tablet by mouth daily.     aspirin EC 81 MG tablet  Take 81 mg by mouth daily.     [facility] VITAMIN D 2000 UNITS Caps  Generic drug:  Cholecalciferol  Take 1 capsule by mouth daily.     ezetimibe 10 MG tablet  Commonly known as:  ZETIA  Take 1 tablet (10 mg total) by mouth daily.     fluticasone 50 MCG/ACT nasal spray  Commonly known as:  FLONASE  Place 2 sprays into both nostrils daily.     gabapentin 300 MG capsule  Commonly known as:  NEURONTIN  TAKE 1 CAPSULE (300 MG TOTAL) BY MOUTH AT BEDTIME.     hydrALAZINE 10 MG tablet  Commonly known as:  APRESOLINE  TAKE 1 TABLET BY MOUTH EVERY 8 HOURS     Insulin Detemir 100 UNIT/ML  Pen  Commonly known as:  LEVEMIR  Inject 10 Units into the skin daily at 10 pm.     insulin lispro 100 UNIT/ML injection  Commonly known as:  HUMALOG  USE AS ADVISED - MAXIMUM 25 UNITS PER DAY. INJECT 7-7-5 UNITS PLUS SSI AS ADVISED.     Insulin Pen Needle 31G X 8 MM Misc  Commonly known as:  B-D ULTRAFINE III SHORT PEN  USE AS DIRECTED WITH INSULIN Dx: 250.02     lisinopril 10 MG tablet  Commonly known as:  PRINIVIL,ZESTRIL  TAKE 1 TABLET (10 MG TOTAL) BY MOUTH DAILY.     meclizine 25 MG tablet  Commonly known as:  ANTIVERT  Take 1 tablet (25 mg total) by mouth 3 (three) times daily as needed for dizziness.     NEXIUM 40 MG capsule  Generic drug:  esomeprazole  TAKE 1 CAPSULE (40 MG TOTAL) BY MOUTH DAILY AT 12 NOON.     nitroGLYCERIN 0.4 MG SL tablet  Commonly known as:  NITROSTAT  Place 1 tablet (0.4 mg total) under the tongue every 5 (five) minutes as needed for chest pain (up to 3 doses).     pantoprazole 40 MG tablet  Commonly  known as:  PROTONIX  Take 1 tablet (40 mg total) by mouth daily.     polyvinyl alcohol 1.4 % ophthalmic solution  Commonly known as:  LIQUIFILM TEARS  Place 1 drop into both eyes daily as needed (dry eyes).     potassium chloride SA 20 MEQ tablet  Commonly known as:  KLOR-CON M20  Take 2 tablets (40 mEq total) by mouth once. Take 2 pills a day     rosuvastatin 20 MG tablet  Commonly known as:  CRESTOR  Take 1 tablet (20 mg total) by mouth daily.     traMADol 50 MG tablet  Commonly known as:  ULTRAM  Take 1 tablet (50 mg total) by mouth every 8 (eight) hours as needed.        Allergies: No Known Allergies  Past Medical History, Surgical history, Social history, and Family History were reviewed and updated.  Review of Systems: All other 10 point review of systems is negative.   Physical Exam:  vitals were not taken for this visit.  Wt Readings from Last 3 Encounters:  09/25/14 156 lb 6.4 oz (70.943 kg)  09/21/14 154 lb 2 oz  (69.911 kg)  09/15/14 155 lb (70.308 kg)    Ocular: Sclerae unicteric, pupils equal, round and reactive to light Ear-nose-throat: Oropharynx clear, dentition fair Lymphatic: No cervical or supraclavicular adenopathy Lungs no rales or rhonchi, good excursion bilaterally Heart regular rate and rhythm, no murmur appreciated Abd soft, nontender, positive bowel sounds MSK no focal spinal tenderness, no joint edema Neuro: non-focal, well-oriented, appropriate affect  Lab Results  Component Value Date   WBC 5.6 09/28/2014   HGB 8.9* 09/28/2014   HCT 27.0* 09/28/2014   MCV 90.7 09/28/2014   PLT 312.0 09/28/2014   Lab Results  Component Value Date   FERRITIN 17* 09/05/2014   IRON 35* 09/05/2014   TIBC 249 09/05/2014   UIBC 214 09/05/2014   IRONPCTSAT 14* 09/05/2014   Lab Results  Component Value Date   RETICCTPCT 2.6* 09/05/2014   RBC 2.98* 09/28/2014   RETICCTABS 65.8 09/05/2014   No results found for: KPAFRELGTCHN, LAMBDASER, KAPLAMBRATIO No results found for: IGGSERUM, IGA, IGMSERUM No results found for: Kathrynn Ducking, MSPIKE, SPEI   Chemistry      Component Value Date/Time   NA 135 09/25/2014 0908   NA 139 07/27/2014 0821   K 4.1 09/25/2014 0908   K 4.0 07/27/2014 0821   CL 106 09/25/2014 0908   CL 106 07/27/2014 0821   CO2 25 09/25/2014 0908   CO2 26 07/27/2014 0821   BUN 28* 09/25/2014 0908   BUN 18 07/27/2014 0821   CREATININE 2.38* 09/25/2014 0908   CREATININE 2.1* 07/27/2014 0821      Component Value Date/Time   CALCIUM 8.5 09/25/2014 0908   CALCIUM 9.2 07/27/2014 0821   ALKPHOS 108 09/15/2014 0828   ALKPHOS 117* 07/27/2014 0821   AST 9 09/15/2014 0828   AST 16 07/27/2014 0821   ALT 8 09/15/2014 0828   ALT 16 07/27/2014 0821   BILITOT 0.3 09/15/2014 0828   BILITOT 0.60 07/27/2014 0821     Impression and Plan: [name] is 69 year old gentleman with multifactorial anemia (iron deficiency/renal  insufficiency). He received Feraheme 3 weeks ago and is feeling much better. He is asymptomatic at this time.  His Hgb today is 9.3 with an MCV of 90. We will give him Aranesp today. We'll see what his iron studies show.  We will plan  to see him back in 1 month for labs and follow-up.  He knows to contact us with any questions or concerns. We can certainly see him sooner if need be.   Eliezer Bottom, NP 7/22/201611:20 AM

## 2014-10-06 NOTE — Patient Instructions (Signed)
Darbepoetin Alfa injection What is this medicine? DARBEPOETIN ALFA (dar be POE e tin AL fa) helps your body make more red blood cells. It is used to treat anemia caused by chronic kidney failure and chemotherapy. This medicine may be used for other purposes; ask your health care provider or pharmacist if you have questions. COMMON BRAND NAME(S): Aranesp What should I tell my health care provider before I take this medicine? They need to know if you have any of these conditions: -blood clotting disorders or history of blood clots -cancer patient not on chemotherapy -cystic fibrosis -heart disease, such as angina, heart failure, or a history of a heart attack -hemoglobin level of 12 g/dL or greater -high blood pressure -low levels of folate, iron, or vitamin B12 -seizures -an unusual or allergic reaction to darbepoetin, erythropoietin, albumin, hamster proteins, latex, other medicines, foods, dyes, or preservatives -pregnant or trying to get pregnant -breast-feeding How should I use this medicine? This medicine is for injection into a vein or under the skin. It is usually given by a health care professional in a hospital or clinic setting. If you get this medicine at home, you will be taught how to prepare and give this medicine. Do not shake the solution before you withdraw a dose. Use exactly as directed. Take your medicine at regular intervals. Do not take your medicine more often than directed. It is important that you put your used needles and syringes in a special sharps container. Do not put them in a trash can. If you do not have a sharps container, call your pharmacist or healthcare provider to get one. Talk to your pediatrician regarding the use of this medicine in children. While this medicine may be used in children as young as 1 year for selected conditions, precautions do apply. Overdosage: If you think you have taken too much of this medicine contact a poison control center or  emergency room at once. NOTE: This medicine is only for you. Do not share this medicine with others. What if I miss a dose? If you miss a dose, take it as soon as you can. If it is almost time for your next dose, take only that dose. Do not take double or extra doses. What may interact with this medicine? Do not take this medicine with any of the following medications: -epoetin alfa This list may not describe all possible interactions. Give your health care provider a list of all the medicines, herbs, non-prescription drugs, or dietary supplements you use. Also tell them if you smoke, drink alcohol, or use illegal drugs. Some items may interact with your medicine. What should I watch for while using this medicine? Visit your prescriber or health care professional for regular checks on your progress and for the needed blood tests and blood pressure measurements. It is especially important for the doctor to make sure your hemoglobin level is in the desired range, to limit the risk of potential side effects and to give you the best benefit. Keep all appointments for any recommended tests. Check your blood pressure as directed. Ask your doctor what your blood pressure should be and when you should contact him or her. As your body makes more red blood cells, you may need to take iron, folic acid, or vitamin B supplements. Ask your doctor or health care provider which products are right for you. If you have kidney disease continue dietary restrictions, even though this medication can make you feel better. Talk with your doctor or health   care professional about the foods you eat and the vitamins that you take. What side effects may I notice from receiving this medicine? Side effects that you should report to your doctor or health care professional as soon as possible: -allergic reactions like skin rash, itching or hives, swelling of the face, lips, or tongue -breathing problems -changes in vision -chest  pain -confusion, trouble speaking or understanding -feeling faint or lightheaded, falls -high blood pressure -muscle aches or pains -pain, swelling, warmth in the leg -rapid weight gain -severe headaches -sudden numbness or weakness of the face, arm or leg -trouble walking, dizziness, loss of balance or coordination -seizures (convulsions) -swelling of the ankles, feet, hands -unusually weak or tired Side effects that usually do not require medical attention (report to your doctor or health care professional if they continue or are bothersome): -diarrhea -fever, chills (flu-like symptoms) -headaches -nausea, vomiting -redness, stinging, or swelling at site where injected This list may not describe all possible side effects. Call your doctor for medical advice about side effects. You may report side effects to FDA at 1-800-FDA-1088. Where should I keep my medicine? Keep out of the reach of children. Store in a refrigerator between 2 and 8 degrees C (36 and 46 degrees F). Do not freeze. Do not shake. Throw away any unused portion if using a single-dose vial. Throw away any unused medicine after the expiration date. NOTE: This sheet is a summary. It may not cover all possible information. If you have questions about this medicine, talk to your doctor, pharmacist, or health care provider.  2015, [employer]. (2008-02-15 10:23:57)  

## 2014-11-03 NOTE — Progress Notes (Signed)
HPI: 69 year old male for evaluation of coronary artery disease. Echocardiogram August 2014 showed ejection fraction 63-84%, grade 1 diastolic dysfunction. Carotid Dopplers August 2014 showed 1-39% bilateral stenosis.  Current Outpatient Prescriptions  Medication Sig Dispense Refill  . amLODipine (NORVASC) 5 MG tablet Take 1 tablet by mouth daily.    [name] Kitchen aspirin EC 81 MG tablet Take 81 mg by mouth daily.    [name] Kitchen atorvastatin (LIPITOR) 80 MG tablet     . ezetimibe (ZETIA) 10 MG tablet Take 1 tablet (10 mg total) by mouth daily. 30 tablet 3  . fluticasone (FLONASE) 50 MCG/ACT nasal spray Place 2 sprays into both nostrils daily.  11  . gabapentin (NEURONTIN) 300 MG capsule TAKE 1 CAPSULE (300 MG TOTAL) BY MOUTH AT BEDTIME. 30 capsule 0  . hydrALAZINE (APRESOLINE) 10 MG tablet TAKE 1 TABLET BY MOUTH EVERY 8 HOURS 90 tablet 2  . Insulin Detemir (LEVEMIR) 100 UNIT/ML Pen Inject 10 Units into the skin daily at 10 pm. (Patient taking differently: Inject 12 Units into the skin daily at 10 pm. ) 15 mL 5  . insulin lispro (HUMALOG) 100 UNIT/ML injection USE AS ADVISED - MAXIMUM 25 UNITS PER DAY. INJECT 7-7-5 UNITS PLUS SSI AS ADVISED. (Patient taking differently: Inject into the skin 3 (three) times daily with meals. USE AS ADVISED - MAXIMUM 25 UNITS PER DAY. INJECT 7---- 25 UNITS PLUS SSI AS ADVISED.) 10 mL 0  . Insulin Pen Needle (B-D ULTRAFINE III SHORT PEN) 31G X 8 MM MISC USE AS DIRECTED WITH INSULIN Dx: 250.02 100 each 5  . lisinopril (PRINIVIL,ZESTRIL) 10 MG tablet TAKE 1 TABLET (10 MG TOTAL) BY MOUTH DAILY. 30 tablet 1  . meclizine (ANTIVERT) 25 MG tablet Take 1 tablet (25 mg total) by mouth 3 (three) times daily as needed for dizziness. 30 tablet 0  . NEXIUM 40 MG capsule TAKE 1 CAPSULE (40 MG TOTAL) BY MOUTH DAILY AT 12 NOON. 30 capsule 8  . nitroGLYCERIN (NITROSTAT) 0.4 MG SL tablet Place 1 tablet (0.4 mg total) under the tongue every 5 (five) minutes as needed for chest pain (up to 3 doses).  (Patient not taking: Reported on 10/06/2014) 25 tablet 2  . pantoprazole (PROTONIX) 40 MG tablet Take 1 tablet (40 mg total) by mouth daily. 30 tablet 3  . polyvinyl alcohol (LIQUIFILM TEARS) 1.4 % ophthalmic solution Place 1 drop into both eyes daily as needed (dry eyes).    . potassium chloride SA (KLOR-CON M20) 20 MEQ tablet Take 2 tablets (40 mEq total) by mouth once. Take 2 pills a day 60 tablet 6  . rosuvastatin (CRESTOR) 20 MG tablet Take 1 tablet (20 mg total) by mouth daily. 30 tablet 1  . Vitamin D, Ergocalciferol, (DRISDOL) 50000 UNITS CAPS capsule Take 50,000 Units by mouth every 7 (seven) days.     No current facility-administered medications for this visit.    No Known Allergies   Past Medical History  Diagnosis Date  . Hypertension   . High cholesterol   . GERD (gastroesophageal reflux disease)   . GI bleed     a. Recurrent GI bleed, tx with IV iron. b. Per heme notes - likely AVMs 04/2012 (tx with cauterization several months ago).  . Orthostatic hypotension     a. Tx with florinef.  . Hematuria     a. Urology note scan from 07/2012: cystoscopy without evidence for bladder lesion, only lateral hypertrophy of posterior urethra, bladder impression from BPH. b. Pt states he  had "some tests" scheduled for later in July 2014.  . Stage III chronic kidney disease     a. Stage 3 (DM with complications ->CKD, peripheral neuropathy).  . Anemia, iron deficiency 03/24/2011    a. Recurrent GI bleed, tx with periodic iron infusions.  . 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 Sleep apnea     "had mask; couldn't sleep in it" (09/20/2012)  . Type II diabetes mellitus     a. Dx 1994, uncontrolled.   . Diabetic peripheral neuropathy 2014    foot pain.  . Chronic lower back pain   . Fatty liver     on CT of 11/2010  . AVM (arteriovenous malformation) of duodenum, acquired     egds in 01/2012, 04/2011  . Polyp, colonic     Colonoscopy 01/2012 "benign" polyp  . CAD (coronary artery disease)       a. Cath 09/2012: moderate borderline CAD in mid LAD/small diagonal branch, mild RCA stenosis, to be managed medically   . BPH (benign prostatic hyperplasia)   . Prostate cancer   . Stroke   . Peripheral neuropathy   . 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 Shoulder open rotator cuff repair Left 1980's  . Inguinal hernia repair Right 2011  . Lumbar disc surgery  1980's  . Robot assisted laparoscopic radical prostatectomy N/A 01/19/2013    Procedure: ROBOTIC ASSISTED LAPAROSCOPIC RADICAL PROSTATECTOMY;  Surgeon: Bernestine Amass, MD;  Location: [facility];  Service: Urology;  Laterality: N/A;  . Lymphadenectomy Bilateral 01/19/2013    Procedure: LYMPHADENECTOMY;  Surgeon: Bernestine Amass, MD;  Location: [facility];  Service: Urology;  Laterality: Bilateral;  . Enteroscopy N/A 03/01/2013    Procedure: ENTEROSCOPY;  Surgeon: Inda Castle, MD;  Location: [facility];  Service: Endoscopy;  Laterality: N/A;  . Left heart catheterization with coronary angiogram N/A 09/21/2012    Procedure: LEFT HEART CATHETERIZATION WITH CORONARY ANGIOGRAM;  Surgeon: Peter M Martinique, MD;  Location: [facility];  Service: Cardiovascular;  Laterality: N/A;    Social History   Social History  . Marital Status: Married    Spouse Name: N/A  . Number of Children: 2  . Years of Education: N/A   Occupational History  . taken out of work due to back    Social History Main Topics  . Smoking status: Current Some Day Smoker -- 1.00 packs/day for 43 years    Types: Cigarettes    Start date: 04/15/1968  . Smokeless tobacco: Never Used     Comment: 07-22--2016  still smoking  . Alcohol Use: No     Comment: 09/20/2012 "Used to; stopped ~ 2009; never had problem w/it"  . Drug Use: No  . Sexual Activity: No   Other Topics Concern  . Not on file   Social History Narrative   Regular exercise: rides horses   Caffeine use: occasionally    Family History   Problem Relation Age of Onset  . Heart attack Brother     Died at 61  . Stroke Father     Died at 10  . Diabetes Sister   . Diabetes Mother   . Stomach cancer Brother   . Heart attack Sister     ROS: no fevers or chills, productive cough, hemoptysis, dysphasia, odynophagia, melena, hematochezia, dysuria, hematuria, rash, seizure activity, orthopnea, PND, pedal edema, claudication. Remaining systems are negative.  Physical Exam:   There were no vitals  taken for this visit.  General:  Well developed/well nourished in NAD Skin warm/dry Patient not depressed No peripheral clubbing Back-normal HEENT-normal/normal eyelids Neck supple/normal carotid upstroke bilaterally; no bruits; no JVD; no thyromegaly chest - CTA/ normal expansion CV - RRR/normal S1 and S2; no murmurs, rubs or gallops;  PMI nondisplaced Abdomen -NT/ND, no HSM, no mass, + bowel sounds, no bruit 2+ femoral pulses, no bruits Ext-no edema, chords, 2+ DP Neuro-grossly nonfocal  ECG 09/04/2014-sinus tachycardia, prior septal infarct cannot be excluded.   This encounter was created in error - please disregard.

## 2014-11-07 ENCOUNTER — Other Ambulatory Visit (INDEPENDENT_AMBULATORY_CARE_PROVIDER_SITE_OTHER): Payer: Medicare Other

## 2014-11-07 DIAGNOSIS — E785 Hyperlipidemia, unspecified: Secondary | ICD-10-CM

## 2014-11-07 LAB — LIPID PANEL
Cholesterol: 184 mg/dL (ref 0–200)
HDL: 29.4 mg/dL — AB (ref >39.00)
LDL Cholesterol: 129 mg/dL — ABNORMAL HIGH (ref 0–99)
NONHDL: 154.3
Total CHOL/HDL Ratio: 6
Triglycerides: 125 mg/dL (ref 0.0–149.0)
VLDL: 25 mg/dL (ref 0.0–40.0)

## 2014-11-08 ENCOUNTER — Ambulatory Visit (HOSPITAL_BASED_OUTPATIENT_CLINIC_OR_DEPARTMENT_OTHER): Payer: Medicare Other

## 2014-11-08 ENCOUNTER — Other Ambulatory Visit (HOSPITAL_BASED_OUTPATIENT_CLINIC_OR_DEPARTMENT_OTHER): Payer: Medicare Other

## 2014-11-08 ENCOUNTER — Encounter: Payer: Medicare Other | Admitting: Cardiology

## 2014-11-08 ENCOUNTER — Ambulatory Visit (HOSPITAL_BASED_OUTPATIENT_CLINIC_OR_DEPARTMENT_OTHER): Payer: Medicare Other | Admitting: Hematology & Oncology

## 2014-11-08 ENCOUNTER — Encounter: Payer: Self-pay | Admitting: Hematology & Oncology

## 2014-11-08 VITALS — BP 155/88 | HR 95 | Temp 97.5°F | Resp 16 | Ht 67.0 in | Wt 158.0 lb

## 2014-11-08 DIAGNOSIS — K922 Gastrointestinal hemorrhage, unspecified: Secondary | ICD-10-CM

## 2014-11-08 DIAGNOSIS — D509 Iron deficiency anemia, unspecified: Secondary | ICD-10-CM

## 2014-11-08 DIAGNOSIS — D631 Anemia in chronic kidney disease: Secondary | ICD-10-CM

## 2014-11-08 DIAGNOSIS — N189 Chronic kidney disease, unspecified: Secondary | ICD-10-CM | POA: Diagnosis present

## 2014-11-08 DIAGNOSIS — D500 Iron deficiency anemia secondary to blood loss (chronic): Secondary | ICD-10-CM

## 2014-11-08 DIAGNOSIS — E1165 Type 2 diabetes mellitus with hyperglycemia: Secondary | ICD-10-CM

## 2014-11-08 DIAGNOSIS — D649 Anemia, unspecified: Secondary | ICD-10-CM | POA: Diagnosis not present

## 2014-11-08 DIAGNOSIS — IMO0002 Reserved for concepts with insufficient information to code with codable children: Secondary | ICD-10-CM

## 2014-11-08 DIAGNOSIS — N289 Disorder of kidney and ureter, unspecified: Secondary | ICD-10-CM | POA: Diagnosis not present

## 2014-11-08 DIAGNOSIS — K254 Chronic or unspecified gastric ulcer with hemorrhage: Secondary | ICD-10-CM

## 2014-11-08 DIAGNOSIS — E1140 Type 2 diabetes mellitus with diabetic neuropathy, unspecified: Secondary | ICD-10-CM

## 2014-11-08 LAB — COMPREHENSIVE METABOLIC PANEL
ALBUMIN: 2.5 g/dL — AB (ref 3.6–5.1)
ALT: 10 U/L (ref 9–46)
AST: 12 U/L (ref 10–35)
Alkaline Phosphatase: 105 U/L (ref 40–115)
BILIRUBIN TOTAL: 0.3 mg/dL (ref 0.2–1.2)
BUN: 24 mg/dL (ref 7–25)
CHLORIDE: 107 mmol/L (ref 98–110)
CO2: 21 mmol/L (ref 20–31)
CREATININE: 2.37 mg/dL — AB (ref 0.70–1.25)
Calcium: 8.1 mg/dL — ABNORMAL LOW (ref 8.6–10.3)
GLUCOSE: 252 mg/dL — AB (ref 65–99)
Potassium: 4.5 mmol/L (ref 3.5–5.3)
SODIUM: 137 mmol/L (ref 135–146)
Total Protein: 5.3 g/dL — ABNORMAL LOW (ref 6.1–8.1)

## 2014-11-08 LAB — CBC WITH DIFFERENTIAL (CANCER CENTER ONLY)
BASO#: 0.1 10*3/uL (ref 0.0–0.2)
BASO%: 0.8 % (ref 0.0–2.0)
EOS ABS: 0.2 10*3/uL (ref 0.0–0.5)
EOS%: 2.4 % (ref 0.0–7.0)
HCT: 23.1 % — ABNORMAL LOW (ref 38.7–49.9)
HGB: 7.8 g/dL — ABNORMAL LOW (ref 13.0–17.1)
LYMPH#: 1 10*3/uL (ref 0.9–3.3)
LYMPH%: 15.4 % (ref 14.0–48.0)
MCH: 30.1 pg (ref 28.0–33.4)
MCHC: 33.8 g/dL (ref 32.0–35.9)
MCV: 89 fL (ref 82–98)
MONO#: 0.8 10*3/uL (ref 0.1–0.9)
MONO%: 12.3 % (ref 0.0–13.0)
NEUT%: 69.1 % (ref 40.0–80.0)
NEUTROS ABS: 4.6 10*3/uL (ref 1.5–6.5)
PLATELETS: 353 10*3/uL (ref 145–400)
RBC: 2.59 10*6/uL — ABNORMAL LOW (ref 4.20–5.70)
RDW: 15.1 % (ref 11.1–15.7)
WBC: 6.6 10*3/uL (ref 4.0–10.0)

## 2014-11-08 LAB — IRON AND TIBC CHCC
%SAT: 10 % — AB (ref 20–55)
IRON: 23 ug/dL — AB (ref 42–163)
TIBC: 240 ug/dL (ref 202–409)
UIBC: 217 ug/dL (ref 117–376)

## 2014-11-08 LAB — RETICULOCYTES (CHCC)
ABS Retic: 92.8 10*3/uL (ref 19.0–186.0)
RBC.: 2.65 MIL/uL — AB (ref 4.22–5.81)
RETIC CT PCT: 3.5 % — AB (ref 0.4–2.3)

## 2014-11-08 LAB — FERRITIN CHCC: Ferritin: 29 ng/ml (ref 22–316)

## 2014-11-08 MED ORDER — DARBEPOETIN ALFA 300 MCG/0.6ML IJ SOSY
PREFILLED_SYRINGE | INTRAMUSCULAR | Status: AC
  Filled 2014-11-08: qty 0.6

## 2014-11-08 MED ORDER — SODIUM CHLORIDE 0.9 % IV SOLN
510.0000 mg | Freq: Once | INTRAVENOUS | Status: AC
  Administered 2014-11-08: 510 mg via INTRAVENOUS
  Filled 2014-11-08: qty 17

## 2014-11-08 MED ORDER — FERUMOXYTOL INJECTION 510 MG/17 ML: 510.0000 mg | Freq: Once | INTRAVENOUS | Status: DC

## 2014-11-08 MED ORDER — DARBEPOETIN ALFA 300 MCG/0.6ML IJ SOSY
300.0000 ug | PREFILLED_SYRINGE | Freq: Once | INTRAMUSCULAR | Status: AC
  Administered 2014-11-08: 300 ug via SUBCUTANEOUS

## 2014-11-08 NOTE — Patient Instructions (Signed)

## 2014-11-08 NOTE — Progress Notes (Signed)
Hematology and Oncology Follow Up Visit  [name] [id number] [birth year] 69 y.o. 11/08/2014   Principle Diagnosis:  1. Iron-deficiency anemia secondary to recurrent gastrointestinal bleeding. 2. 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  Current Therapy:   1. Aranesp 300 mcg subcu as needed for hemoglobin less than 10 2. IV iron as indicated    Interim History:  [name] is here today for a follow-up. He is feeling fatigued. His main complaint has been some numbness in the right leg. He has had back surgery about 7-8 years ago. It sounds like this is an issue that his orthopedist needs to deal with. I'll think he has not seen his orthopedist for several years. As such, we will pothead make an appointment for him.  He also is complaining of urinary leakage. He had prostate cancer surgery over a year ago. He is not doing any type of bladder physical therapy. He sees his urologist next week.  He denies any obvious melena.  He's had his blood sugars which again, or poorly controlled.  He has had no cough.  He's had no leg swelling.  Overall, his performance status is ECOG 1.    Medications:    Medication List       This list is accurate as of: 11/08/14  8:29 AM.  Always use your most recent med list.               amLODipine 5 MG tablet  Commonly known as:  NORVASC  Take 1 tablet by mouth daily.     aspirin EC 81 MG tablet  Take 81 mg by mouth daily.     atorvastatin 80 MG tablet  Commonly known as:  LIPITOR     ezetimibe 10 MG tablet  Commonly known as:  ZETIA  Take 1 tablet (10 mg total) by mouth daily.     fluticasone 50 MCG/ACT nasal spray  Commonly known as:  FLONASE  Place 2 sprays into both nostrils daily.     gabapentin 300 MG capsule  Commonly known as:  NEURONTIN  TAKE 1 CAPSULE (300 MG TOTAL) BY MOUTH AT BEDTIME.     hydrALAZINE 10 MG tablet  Commonly known as:  APRESOLINE  TAKE 1 TABLET BY MOUTH EVERY 8 HOURS       Insulin Detemir 100 UNIT/ML Pen  Commonly known as:  LEVEMIR  Inject 10 Units into the skin daily at 10 pm.     insulin lispro 100 UNIT/ML injection  Commonly known as:  HUMALOG  USE AS ADVISED - MAXIMUM 25 UNITS PER DAY. INJECT 7-7-5 UNITS PLUS SSI AS ADVISED.     Insulin Pen Needle 31G X 8 MM Misc  Commonly known as:  B-D ULTRAFINE III SHORT PEN  USE AS DIRECTED WITH INSULIN Dx: 250.02     lisinopril 10 MG tablet  Commonly known as:  PRINIVIL,ZESTRIL  TAKE 1 TABLET (10 MG TOTAL) BY MOUTH DAILY.     meclizine 25 MG tablet  Commonly known as:  ANTIVERT  Take 1 tablet (25 mg total) by mouth 3 (three) times daily as needed for dizziness.     NEXIUM 40 MG capsule  Generic drug:  esomeprazole  TAKE 1 CAPSULE (40 MG TOTAL) BY MOUTH DAILY AT 12 NOON.     nitroGLYCERIN 0.4 MG SL tablet  Commonly known as:  NITROSTAT  Place 1 tablet (0.4 mg total) under the tongue every 5 (five) minutes as needed for chest pain (up to 3  doses).     pantoprazole 40 MG tablet  Commonly known as:  PROTONIX  Take 1 tablet (40 mg total) by mouth daily.     polyvinyl alcohol 1.4 % ophthalmic solution  Commonly known as:  LIQUIFILM TEARS  Place 1 drop into both eyes daily as needed (dry eyes).     potassium chloride SA 20 MEQ tablet  Commonly known as:  KLOR-CON M20  Take 2 tablets (40 mEq total) by mouth once. Take 2 pills a day     rosuvastatin 20 MG tablet  Commonly known as:  CRESTOR  Take 1 tablet (20 mg total) by mouth daily.     Vitamin D (Ergocalciferol) 50000 UNITS Caps capsule  Commonly known as:  DRISDOL  Take 50,000 Units by mouth every 7 (seven) days.        Allergies: No Known Allergies  Past Medical History, Surgical history, Social history, and Family History were reviewed and updated.  Review of Systems: All other 10 point review of systems is negative.   Physical Exam:  height is 5\' 7"  (1.702 m) and weight is 158 lb (71.668 kg). His oral temperature is 97.5 F (36.4  C). His blood pressure is 155/88 and his pulse is 95. His respiration is 16.   Wt Readings from Last 3 Encounters:  11/08/14 158 lb (71.668 kg)  10/06/14 155 lb (70.308 kg)  09/25/14 156 lb 6.4 oz (70.943 kg)    Well-developed and well-nourished African-American male. Head and neck exam shows no ocular or oral lesions. He has no scleral icterus. He has no adenopathy in the neck. Lungs are clear. Cardiac exam regular rate and rhythm with no murmurs, rubs or bruits. Abdomen is soft. He has good bowel sounds. He has no fluid wave. There is no palpable liver or spleen. Back exam shows the laminectomy scar in the lumbar spine. Extremities shows no clubbing, cyanosis or edema. He has good strength in his legs. Skin exam shows no rashes, ecchymoses or petechia. Neurological exam is nonfocal.  Lab Results  Component Value Date   WBC 6.6 11/08/2014   HGB 7.8* 11/08/2014   HCT 23.1* 11/08/2014   MCV 89 11/08/2014   PLT 353 11/08/2014   Lab Results  Component Value Date   FERRITIN 118 10/06/2014   IRON 75 10/06/2014   TIBC 205 10/06/2014   UIBC 130 10/06/2014   IRONPCTSAT 37 10/06/2014   Lab Results  Component Value Date   RETICCTPCT 3.9* 10/06/2014   RBC 2.59* 11/08/2014   RETICCTABS 122.5 10/06/2014   No results found for: KPAFRELGTCHN, LAMBDASER, KAPLAMBRATIO No results found for: IGGSERUM, IGA, IGMSERUM No results found for: Kathrynn Ducking, MSPIKE, SPEI   Chemistry      Component Value Date/Time   NA 139 10/06/2014 1102   NA 135 09/25/2014 0908   NA 139 07/27/2014 0821   K 4.1 10/06/2014 1102   K 4.1 09/25/2014 0908   K 4.0 07/27/2014 0821   CL 106 09/25/2014 0908   CL 106 07/27/2014 0821   CO2 25 10/06/2014 1102   CO2 25 09/25/2014 0908   CO2 26 07/27/2014 0821   BUN 24.8 10/06/2014 1102   BUN 28* 09/25/2014 0908   BUN 18 07/27/2014 0821   CREATININE 2.5* 10/06/2014 1102   CREATININE 2.38* 09/25/2014 0908   CREATININE 2.1*  07/27/2014 0821      Component Value Date/Time   CALCIUM 8.5 10/06/2014 1102   CALCIUM 8.5 09/25/2014 0908   CALCIUM  9.2 07/27/2014 0821   ALKPHOS 111 10/06/2014 1102   ALKPHOS 108 09/15/2014 0828   ALKPHOS 117* 07/27/2014 0821   AST 13 10/06/2014 1102   AST 9 09/15/2014 0828   AST 16 07/27/2014 0821   ALT 11 10/06/2014 1102   ALT 8 09/15/2014 0828   ALT 16 07/27/2014 0821   BILITOT 0.36 10/06/2014 1102   BILITOT 0.3 09/15/2014 0828   BILITOT 0.60 07/27/2014 0821     Impression and Plan: Mr. Russett is 69 year old gentleman with multifactorial anemia (iron deficiency/renal insufficiency).   We will go ahead and give him iron today. We will go ahead and give him Aranesp.  Again, I think that a lot of this is really going to be based upon his diabetes. His blood sugars just are not all that well controlled.  We will plan to get him back in 3-4 weeks.  Volanda Napoleon, MD 8/24/20168:29 AM

## 2014-11-09 ENCOUNTER — Telehealth: Payer: Self-pay | Admitting: Family

## 2014-11-09 NOTE — Telephone Encounter (Signed)
Notified pt. 

## 2014-11-09 NOTE — Telephone Encounter (Signed)
Cholesterol is better but LDL still above goal. Please continue current meds and continue to work on low fat/low cholesterol diet.

## 2014-11-27 ENCOUNTER — Ambulatory Visit: Payer: Medicare Other

## 2014-11-27 ENCOUNTER — Other Ambulatory Visit: Payer: Medicare Other

## 2014-11-27 ENCOUNTER — Ambulatory Visit: Payer: Medicare Other | Admitting: Family

## 2014-11-29 ENCOUNTER — Ambulatory Visit (HOSPITAL_BASED_OUTPATIENT_CLINIC_OR_DEPARTMENT_OTHER): Payer: Medicare Other

## 2014-11-29 ENCOUNTER — Encounter: Payer: Self-pay | Admitting: Family

## 2014-11-29 ENCOUNTER — Telehealth: Payer: Self-pay | Admitting: *Deleted

## 2014-11-29 ENCOUNTER — Ambulatory Visit (HOSPITAL_BASED_OUTPATIENT_CLINIC_OR_DEPARTMENT_OTHER): Payer: Medicare Other | Admitting: Family

## 2014-11-29 ENCOUNTER — Other Ambulatory Visit (HOSPITAL_BASED_OUTPATIENT_CLINIC_OR_DEPARTMENT_OTHER): Payer: Medicare Other

## 2014-11-29 VITALS — BP 156/72 | HR 85 | Temp 97.5°F | Resp 18 | Ht 67.0 in | Wt 156.0 lb

## 2014-11-29 DIAGNOSIS — N189 Chronic kidney disease, unspecified: Principal | ICD-10-CM

## 2014-11-29 DIAGNOSIS — Z23 Encounter for immunization: Secondary | ICD-10-CM | POA: Diagnosis not present

## 2014-11-29 DIAGNOSIS — E1140 Type 2 diabetes mellitus with diabetic neuropathy, unspecified: Secondary | ICD-10-CM

## 2014-11-29 DIAGNOSIS — D509 Iron deficiency anemia, unspecified: Secondary | ICD-10-CM

## 2014-11-29 DIAGNOSIS — N289 Disorder of kidney and ureter, unspecified: Secondary | ICD-10-CM | POA: Diagnosis present

## 2014-11-29 DIAGNOSIS — K922 Gastrointestinal hemorrhage, unspecified: Secondary | ICD-10-CM | POA: Diagnosis not present

## 2014-11-29 DIAGNOSIS — D649 Anemia, unspecified: Secondary | ICD-10-CM

## 2014-11-29 DIAGNOSIS — Z0000 Encounter for general adult medical examination without abnormal findings: Secondary | ICD-10-CM

## 2014-11-29 DIAGNOSIS — D500 Iron deficiency anemia secondary to blood loss (chronic): Secondary | ICD-10-CM

## 2014-11-29 DIAGNOSIS — E1165 Type 2 diabetes mellitus with hyperglycemia: Secondary | ICD-10-CM

## 2014-11-29 DIAGNOSIS — D631 Anemia in chronic kidney disease: Secondary | ICD-10-CM

## 2014-11-29 DIAGNOSIS — IMO0002 Reserved for concepts with insufficient information to code with codable children: Secondary | ICD-10-CM

## 2014-11-29 DIAGNOSIS — K254 Chronic or unspecified gastric ulcer with hemorrhage: Secondary | ICD-10-CM

## 2014-11-29 LAB — CBC WITH DIFFERENTIAL (CANCER CENTER ONLY)
BASO#: 0.1 10*3/uL (ref 0.0–0.2)
BASO%: 0.8 % (ref 0.0–2.0)
EOS%: 2.2 % (ref 0.0–7.0)
Eosinophils Absolute: 0.1 10*3/uL (ref 0.0–0.5)
HCT: 29.5 % — ABNORMAL LOW (ref 38.7–49.9)
HGB: 9.8 g/dL — ABNORMAL LOW (ref 13.0–17.1)
LYMPH#: 1 10*3/uL (ref 0.9–3.3)
LYMPH%: 17.2 % (ref 14.0–48.0)
MCH: 29.6 pg (ref 28.0–33.4)
MCHC: 33.2 g/dL (ref 32.0–35.9)
MCV: 89 fL (ref 82–98)
MONO#: 0.8 10*3/uL (ref 0.1–0.9)
MONO%: 13.3 % — AB (ref 0.0–13.0)
NEUT#: 4 10*3/uL (ref 1.5–6.5)
NEUT%: 66.5 % (ref 40.0–80.0)
PLATELETS: 341 10*3/uL (ref 145–400)
RBC: 3.31 10*6/uL — AB (ref 4.20–5.70)
RDW: 15.5 % (ref 11.1–15.7)
WBC: 5.9 10*3/uL (ref 4.0–10.0)

## 2014-11-29 LAB — COMPREHENSIVE METABOLIC PANEL (CC13)
ALT: 9 U/L (ref 0–55)
ANION GAP: 6 meq/L (ref 3–11)
AST: 12 U/L (ref 5–34)
Albumin: 2 g/dL — ABNORMAL LOW (ref 3.5–5.0)
Alkaline Phosphatase: 114 U/L (ref 40–150)
BUN: 29.1 mg/dL — ABNORMAL HIGH (ref 7.0–26.0)
CHLORIDE: 110 meq/L — AB (ref 98–109)
CO2: 25 meq/L (ref 22–29)
CREATININE: 3 mg/dL — AB (ref 0.7–1.3)
Calcium: 8.3 mg/dL — ABNORMAL LOW (ref 8.4–10.4)
EGFR: 23 mL/min/{1.73_m2} — AB (ref >90)
GLUCOSE: 291 mg/dL — AB (ref 70–140)
Potassium: 4.1 mEq/L (ref 3.5–5.1)
SODIUM: 141 meq/L (ref 136–145)
TOTAL PROTEIN: 5.8 g/dL — AB (ref 6.4–8.3)
Total Bilirubin: <0.2 mg/dL (ref 0.20–1.20)

## 2014-11-29 LAB — IRON AND TIBC CHCC
%SAT: 29 % (ref 20–55)
Iron: 58 ug/dL (ref 42–163)
TIBC: 201 ug/dL — AB (ref 202–409)
UIBC: 143 ug/dL (ref 117–376)

## 2014-11-29 LAB — RETICULOCYTES (CHCC)
ABS Retic: 78.4 10*3/uL (ref 19.0–186.0)
RBC.: 3.41 MIL/uL — AB (ref 4.22–5.81)
RETIC CT PCT: 2.3 % (ref 0.4–2.3)

## 2014-11-29 LAB — CHCC SATELLITE - SMEAR

## 2014-11-29 LAB — FERRITIN CHCC: Ferritin: 80 ng/ml (ref 22–316)

## 2014-11-29 MED ORDER — INFLUENZA VAC SPLIT QUAD 0.5 ML IM SUSY
0.5000 mL | PREFILLED_SYRINGE | Freq: Once | INTRAMUSCULAR | Status: AC
  Administered 2014-11-29: 0.5 mL via INTRAMUSCULAR
  Filled 2014-11-29: qty 0.5

## 2014-11-29 MED ORDER — SODIUM CHLORIDE 0.9 % IV SOLN
510.0000 mg | Freq: Once | INTRAVENOUS | Status: AC
  Administered 2014-11-29: 510 mg via INTRAVENOUS
  Filled 2014-11-29: qty 17

## 2014-11-29 MED ORDER — DARBEPOETIN ALFA 300 MCG/0.6ML IJ SOSY
PREFILLED_SYRINGE | INTRAMUSCULAR | Status: AC
  Filled 2014-11-29: qty 0.6

## 2014-11-29 MED ORDER — DARBEPOETIN ALFA 300 MCG/0.6ML IJ SOSY
300.0000 ug | PREFILLED_SYRINGE | Freq: Once | INTRAMUSCULAR | Status: AC
  Administered 2014-11-29: 300 ug via SUBCUTANEOUS

## 2014-11-29 NOTE — Progress Notes (Signed)
Hematology and Oncology Follow Up Visit  [name] [id number] 03-Feb-1946 69 y.o. 11/29/2014   Principle Diagnosis:  1. Iron-deficiency anemia secondary to recurrent gastrointestinal bleeding. 2. 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  Current Therapy:   1. Aranesp 300 mcg subcu as needed for hemoglobin less than 10 2. IV iron as indicated    Interim History:  [name] is here today for a follow-up. He is doing well. He has responded nicely to the [facility] he received in August. His energy is improving and he denies fatigue.  He does have numbness in his right leg. He saw his neurologist earlier this week where he received a steroid injection in the right hip. No swelling in his extremities.  He denies fever, chills, n/v, cough, rash, blurred vision, SOB, chest pain, palpitations, abdominal pain, constipation or diarrhea. No blood in his urine or stool.  He has a good appetite and is eating well. His blood sugars continue to fluctuate and he is on Levemir and Humalog. He is staying hydrated. His weight is stable at 156 lbs.  He states that he is getting out and walking some for exercise.   Medications:    Medication List       This list is accurate as of: 11/29/14  1:37 PM.  Always use your most recent med list.               amLODipine 5 MG tablet  Commonly known as:  NORVASC  Take 1 tablet by mouth daily.     aspirin EC 81 MG tablet  Take 81 mg by mouth daily.     atorvastatin 80 MG tablet  Commonly known as:  LIPITOR     [employer]S D3 2000 UNITS Caps  Generic drug:  Cholecalciferol  Take 1 capsule by mouth daily.     ezetimibe 10 MG tablet  Commonly known as:  ZETIA  Take 1 tablet (10 mg total) by mouth daily.     fluticasone 50 MCG/ACT nasal spray  Commonly known as:  FLONASE  Place 2 sprays into both nostrils daily.     gabapentin 300 MG capsule  Commonly known as:  NEURONTIN  TAKE 1 CAPSULE (300 MG TOTAL) BY MOUTH AT BEDTIME.     hydrALAZINE 10 MG tablet  Commonly known as:  APRESOLINE  TAKE 1 TABLET BY MOUTH EVERY 8 HOURS     Insulin Detemir 100 UNIT/ML Pen  Commonly known as:  LEVEMIR  Inject 10 Units into the skin daily at 10 pm.     insulin lispro 100 UNIT/ML injection  Commonly known as:  HUMALOG  USE AS ADVISED - MAXIMUM 25 UNITS PER DAY. INJECT 7-7-5 UNITS PLUS SSI AS ADVISED.     Insulin Pen Needle 31G X 8 MM Misc  Commonly known as:  B-D ULTRAFINE III SHORT PEN  USE AS DIRECTED WITH INSULIN Dx: 250.02     lisinopril 10 MG tablet  Commonly known as:  PRINIVIL,ZESTRIL  TAKE 1 TABLET (10 MG TOTAL) BY MOUTH DAILY.     meclizine 25 MG tablet  Commonly known as:  ANTIVERT  Take 1 tablet (25 mg total) by mouth 3 (three) times daily as needed for dizziness.     neomycin-polymyxin-hydrocortisone 3.5-10000-1 otic suspension  Commonly known as:  CORTISPORIN  INSTILL 4 DROPS TWICE DAILY AS NEEDED FOR ITCHING     NEXIUM 40 MG capsule  Generic drug:  esomeprazole  TAKE 1 CAPSULE (40 MG TOTAL) BY MOUTH DAILY  AT 12 NOON.     NEXIUM 40 MG capsule  Generic drug:  esomeprazole  TAKE 1 CAPSULE (40 MG TOTAL) BY MOUTH DAILY AT 12 NOON.     nitroGLYCERIN 0.4 MG SL tablet  Commonly known as:  NITROSTAT  Place 1 tablet (0.4 mg total) under the tongue every 5 (five) minutes as needed for chest pain (up to 3 doses).     pantoprazole 40 MG tablet  Commonly known as:  PROTONIX  Take 1 tablet (40 mg total) by mouth daily.     polyvinyl alcohol 1.4 % ophthalmic solution  Commonly known as:  LIQUIFILM TEARS  Place 1 drop into both eyes daily as needed (dry eyes).     potassium chloride SA 20 MEQ tablet  Commonly known as:  KLOR-CON M20  Take 2 tablets (40 mEq total) by mouth once. Take 2 pills a day     rosuvastatin 20 MG tablet  Commonly known as:  CRESTOR  Take 1 tablet (20 mg total) by mouth daily.     Vitamin D (Ergocalciferol) 50000 UNITS Caps capsule  Commonly known as:  DRISDOL  Take 50,000 Units  by mouth every 7 (seven) days.        Allergies: No Known Allergies  Past Medical History, Surgical history, Social history, and Family History were reviewed and updated.  Review of Systems: All other 10 point review of systems is negative.   Physical Exam:  height is 5\' 7"  (1.702 m) and weight is 156 lb (70.761 kg). His oral temperature is 97.5 F (36.4 C). His blood pressure is 156/72 and his pulse is 85. His respiration is 18.   Wt Readings from Last 3 Encounters:  11/29/14 156 lb (70.761 kg)  11/08/14 158 lb (71.668 kg)  10/06/14 155 lb (70.308 kg)    Ocular: Sclerae unicteric, pupils equal, round and reactive to light Ear-nose-throat: Oropharynx clear, dentition fair Lymphatic: No cervical or supraclavicular adenopathy Lungs no rales or rhonchi, good excursion bilaterally Heart regular rate and rhythm, no murmur appreciated Abd soft, nontender, positive bowel sounds MSK no focal spinal tenderness, no joint edema Neuro: non-focal, well-oriented, appropriate affect  Lab Results  Component Value Date   WBC 6.6 11/08/2014   HGB 7.8* 11/08/2014   HCT 23.1* 11/08/2014   MCV 89 11/08/2014   PLT 353 11/08/2014   Lab Results  Component Value Date   FERRITIN 29 11/08/2014   IRON 23* 11/08/2014   TIBC 240 11/08/2014   UIBC 217 11/08/2014   IRONPCTSAT 10* 11/08/2014   Lab Results  Component Value Date   RETICCTPCT 3.5* 11/08/2014   RBC 2.65* 11/08/2014   RETICCTABS 92.8 11/08/2014   No results found for: KPAFRELGTCHN, LAMBDASER, KAPLAMBRATIO No results found for: IGGSERUM, IGA, IGMSERUM No results found for: Odetta Pink, SPEI   Chemistry      Component Value Date/Time   NA 137 11/08/2014 0801   NA 139 10/06/2014 1102   NA 139 07/27/2014 0821   K 4.5 11/08/2014 0801   K 4.1 10/06/2014 1102   K 4.0 07/27/2014 0821   CL 107 11/08/2014 0801   CL 106 07/27/2014 0821   CO2 21 11/08/2014 0801   CO2 25  10/06/2014 1102   CO2 26 07/27/2014 0821   BUN 24 11/08/2014 0801   BUN 24.8 10/06/2014 1102   BUN 18 07/27/2014 0821   CREATININE 2.37* 11/08/2014 0801   CREATININE 2.5* 10/06/2014 1102   CREATININE 2.1* 07/27/2014 8144  Component Value Date/Time   CALCIUM 8.1* 11/08/2014 0801   CALCIUM 8.5 10/06/2014 1102   CALCIUM 9.2 07/27/2014 0821   ALKPHOS 105 11/08/2014 0801   ALKPHOS 111 10/06/2014 1102   ALKPHOS 117* 07/27/2014 0821   AST 12 11/08/2014 0801   AST 13 10/06/2014 1102   AST 16 07/27/2014 0821   ALT 10 11/08/2014 0801   ALT 11 10/06/2014 1102   ALT 16 07/27/2014 0821   BILITOT 0.3 11/08/2014 0801   BILITOT 0.36 10/06/2014 1102   BILITOT 0.60 07/27/2014 0821     Impression and Plan: Mr. Stearns is 69 year old gentleman with multifactorial anemia (iron deficiency/renal insufficiency). He received Feraheme at the end of August and has responded nicely.  His CBC is improving. His Hgb is now 9.8 with an MCV of 89.   We will go ahead and give him Aranesp and Feraheme today to make sure he maintains these levels.  We will plan to see him back in 1 month for labs and follow-up.  He knows to contact us with any questions or concerns. We can certainly see him sooner if need be.   Eliezer Bottom, NP 9/14/20161:37 PM

## 2014-11-29 NOTE — Telephone Encounter (Signed)
Critical Value Creatinine 3.0 Dr Marin Olp notified. No orders received

## 2014-11-29 NOTE — Patient Instructions (Signed)

## 2014-12-29 ENCOUNTER — Other Ambulatory Visit (HOSPITAL_BASED_OUTPATIENT_CLINIC_OR_DEPARTMENT_OTHER): Payer: Medicare Other

## 2014-12-29 ENCOUNTER — Ambulatory Visit (HOSPITAL_BASED_OUTPATIENT_CLINIC_OR_DEPARTMENT_OTHER): Payer: Medicare Other | Admitting: Family

## 2014-12-29 ENCOUNTER — Encounter: Payer: Self-pay | Admitting: Family

## 2014-12-29 VITALS — BP 157/84 | HR 97 | Temp 97.4°F | Resp 16 | Ht 67.0 in | Wt 155.0 lb

## 2014-12-29 DIAGNOSIS — N289 Disorder of kidney and ureter, unspecified: Secondary | ICD-10-CM

## 2014-12-29 DIAGNOSIS — Z0000 Encounter for general adult medical examination without abnormal findings: Secondary | ICD-10-CM

## 2014-12-29 DIAGNOSIS — D631 Anemia in chronic kidney disease: Secondary | ICD-10-CM

## 2014-12-29 DIAGNOSIS — N189 Chronic kidney disease, unspecified: Secondary | ICD-10-CM

## 2014-12-29 DIAGNOSIS — D509 Iron deficiency anemia, unspecified: Secondary | ICD-10-CM

## 2014-12-29 LAB — CBC WITH DIFFERENTIAL (CANCER CENTER ONLY)
BASO#: 0 10*3/uL (ref 0.0–0.2)
BASO%: 0.5 % (ref 0.0–2.0)
EOS ABS: 0.1 10*3/uL (ref 0.0–0.5)
EOS%: 2 % (ref 0.0–7.0)
HEMATOCRIT: 32.1 % — AB (ref 38.7–49.9)
HEMOGLOBIN: 10.9 g/dL — AB (ref 13.0–17.1)
LYMPH#: 1 10*3/uL (ref 0.9–3.3)
LYMPH%: 18.1 % (ref 14.0–48.0)
MCH: 30 pg (ref 28.0–33.4)
MCHC: 34 g/dL (ref 32.0–35.9)
MCV: 88 fL (ref 82–98)
MONO#: 0.5 10*3/uL (ref 0.1–0.9)
MONO%: 9.4 % (ref 0.0–13.0)
NEUT%: 70 % (ref 40.0–80.0)
NEUTROS ABS: 3.9 10*3/uL (ref 1.5–6.5)
Platelets: 275 10*3/uL (ref 145–400)
RBC: 3.63 10*6/uL — ABNORMAL LOW (ref 4.20–5.70)
RDW: 15 % (ref 11.1–15.7)
WBC: 5.6 10*3/uL (ref 4.0–10.0)

## 2014-12-29 LAB — COMPREHENSIVE METABOLIC PANEL (CC13)
ALT: <9 U/L (ref 0–55)
AST: 14 U/L (ref 5–34)
Albumin: 2.1 g/dL — ABNORMAL LOW (ref 3.5–5.0)
Alkaline Phosphatase: 144 U/L (ref 40–150)
Anion Gap: 9 mEq/L (ref 3–11)
BUN: 25.5 mg/dL (ref 7.0–26.0)
CALCIUM: 8.7 mg/dL (ref 8.4–10.4)
CHLORIDE: 108 meq/L (ref 98–109)
CO2: 23 mEq/L (ref 22–29)
Creatinine: 3.2 mg/dL (ref 0.7–1.3)
EGFR: 22 mL/min/{1.73_m2} — ABNORMAL LOW (ref >90)
GLUCOSE: 377 mg/dL — AB (ref 70–140)
POTASSIUM: 4.5 meq/L (ref 3.5–5.1)
SODIUM: 140 meq/L (ref 136–145)
Total Bilirubin: <0.3 mg/dL (ref 0.20–1.20)
Total Protein: 6.2 g/dL — ABNORMAL LOW (ref 6.4–8.3)

## 2014-12-29 LAB — RETICULOCYTES (CHCC)
ABS RETIC: 55.2 10*3/uL (ref 19.0–186.0)
RBC.: 3.68 MIL/uL — ABNORMAL LOW (ref 4.22–5.81)
RETIC CT PCT: 1.5 % (ref 0.4–2.3)

## 2014-12-29 LAB — CHCC SATELLITE - SMEAR

## 2014-12-29 NOTE — Progress Notes (Signed)
Hematology and Oncology Follow Up Visit  [name] [id number] 1946/03/12 69 y.o. 12/29/2014   Principle Diagnosis:  1. Iron-deficiency anemia secondary to recurrent gastrointestinal bleeding. 2. 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  Current Therapy:   1. Aranesp 300 mcg subcu as needed for hemoglobin less than 10 2. IV iron as indicated    Interim History:  [name] is here today for a follow-up. He is still getting along nicely and has no complaints at this time. He received Feraheme and Aranesp in September and has responded nicely. His Hgb today is 10.9.  No fever, chills, n/v, cough, rash, dizziness, SOB, chest pain, palpitations, abdominal pain or changes in bowel or bladder habits.  No episodes of bleeding or bruising.  He hasn't been able to get out and walk as much because of the numbness in his right leg. He had an appointment with his neurologist today but was unable to make it and has to reschedule.  He has no swelling in his extremities.  He states that his blood sugars have been running lower sometimes in the 2s lately. He has had no medication changes. His CMP today is pending.  He has a good appetite and is staying hydrated. His weight is stable.   Medications:    Medication List       This list is accurate as of: 12/29/14  1:17 PM.  Always use your most recent med list.               amLODipine 5 MG tablet  Commonly known as:  NORVASC  Take 1 tablet by mouth daily.     aspirin EC 81 MG tablet  Take 81 mg by mouth daily.     atorvastatin 80 MG tablet  Commonly known as:  LIPITOR     CVS D3 2000 UNITS Caps  Generic drug:  Cholecalciferol  Take 1 capsule by mouth daily.     ezetimibe 10 MG tablet  Commonly known as:  ZETIA  Take 1 tablet (10 mg total) by mouth daily.     fluticasone 50 MCG/ACT nasal spray  Commonly known as:  FLONASE  Place 2 sprays into both nostrils daily.     gabapentin 300 MG capsule    Commonly known as:  NEURONTIN  TAKE 1 CAPSULE (300 MG TOTAL) BY MOUTH AT BEDTIME.     hydrALAZINE 10 MG tablet  Commonly known as:  APRESOLINE  TAKE 1 TABLET BY MOUTH EVERY 8 HOURS     Insulin Detemir 100 UNIT/ML Pen  Commonly known as:  LEVEMIR  Inject 10 Units into the skin daily at 10 pm.     insulin lispro 100 UNIT/ML injection  Commonly known as:  HUMALOG  USE AS ADVISED - MAXIMUM 25 UNITS PER DAY. INJECT 7-7-5 UNITS PLUS SSI AS ADVISED.     Insulin Pen Needle 31G X 8 MM Misc  Commonly known as:  B-D ULTRAFINE III SHORT PEN  USE AS DIRECTED WITH INSULIN Dx: 250.02     lisinopril 10 MG tablet  Commonly known as:  PRINIVIL,ZESTRIL  TAKE 1 TABLET (10 MG TOTAL) BY MOUTH DAILY.     meclizine 25 MG tablet  Commonly known as:  ANTIVERT  Take 1 tablet (25 mg total) by mouth 3 (three) times daily as needed for dizziness.     neomycin-polymyxin-hydrocortisone 3.5-10000-1 otic suspension  Commonly known as:  CORTISPORIN  INSTILL 4 DROPS TWICE DAILY AS NEEDED FOR ITCHING  NEXIUM 40 MG capsule  Generic drug:  esomeprazole  TAKE 1 CAPSULE (40 MG TOTAL) BY MOUTH DAILY AT 12 NOON.     NEXIUM 40 MG capsule  Generic drug:  esomeprazole  TAKE 1 CAPSULE (40 MG TOTAL) BY MOUTH DAILY AT 12 NOON.     nitroGLYCERIN 0.4 MG SL tablet  Commonly known as:  NITROSTAT  Place 1 tablet (0.4 mg total) under the tongue every 5 (five) minutes as needed for chest pain (up to 3 doses).     pantoprazole 40 MG tablet  Commonly known as:  PROTONIX  Take 1 tablet (40 mg total) by mouth daily.     polyvinyl alcohol 1.4 % ophthalmic solution  Commonly known as:  LIQUIFILM TEARS  Place 1 drop into both eyes daily as needed (dry eyes).     potassium chloride SA 20 MEQ tablet  Commonly known as:  KLOR-CON M20  Take 2 tablets (40 mEq total) by mouth once. Take 2 pills a day     rosuvastatin 20 MG tablet  Commonly known as:  CRESTOR  Take 1 tablet (20 mg total) by mouth daily.     Vitamin D  (Ergocalciferol) 50000 UNITS Caps capsule  Commonly known as:  DRISDOL  Take 50,000 Units by mouth every 7 (seven) days.        Allergies: No Known Allergies  Past Medical History, Surgical history, Social history, and Family History were reviewed and updated.  Review of Systems: All other 10 point review of systems is negative.   Physical Exam:  height is 5\' 7"  (1.702 m) and weight is 155 lb (70.308 kg). His oral temperature is 97.4 F (36.3 C). His blood pressure is 157/84 and his pulse is 97. His respiration is 16.   Wt Readings from Last 3 Encounters:  12/29/14 155 lb (70.308 kg)  11/29/14 156 lb (70.761 kg)  11/08/14 158 lb (71.668 kg)    Ocular: Sclerae unicteric, pupils equal, round and reactive to light Ear-nose-throat: Oropharynx clear, dentition fair Lymphatic: No cervical or supraclavicular adenopathy Lungs no rales or rhonchi, good excursion bilaterally Heart regular rate and rhythm, no murmur appreciated Abd soft, nontender, positive bowel sounds MSK no focal spinal tenderness, no joint edema Neuro: non-focal, well-oriented, appropriate affect  Lab Results  Component Value Date   WBC 5.9 11/29/2014   HGB 9.8* 11/29/2014   HCT 29.5* 11/29/2014   MCV 89 11/29/2014   PLT 341 11/29/2014   Lab Results  Component Value Date   FERRITIN 80 11/29/2014   IRON 58 11/29/2014   TIBC 201* 11/29/2014   UIBC 143 11/29/2014   IRONPCTSAT 29 11/29/2014   Lab Results  Component Value Date   RETICCTPCT 2.3 11/29/2014   RBC 3.31* 11/29/2014   RETICCTABS 78.4 11/29/2014   No results found for: KPAFRELGTCHN, LAMBDASER, KAPLAMBRATIO No results found for: IGGSERUM, IGA, IGMSERUM No results found for: Kathrynn Ducking, MSPIKE, SPEI   Chemistry      Component Value Date/Time   NA 141 11/29/2014 1344   NA 137 11/08/2014 0801   NA 139 07/27/2014 0821   K 4.1 11/29/2014 1344   K 4.5 11/08/2014 0801   K 4.0 07/27/2014 0821   CL  107 11/08/2014 0801   CL 106 07/27/2014 0821   CO2 25 11/29/2014 1344   CO2 21 11/08/2014 0801   CO2 26 07/27/2014 0821   BUN 29.1* 11/29/2014 1344   BUN 24 11/08/2014 0801   BUN 18 07/27/2014 3159  CREATININE 3.0* 11/29/2014 1344   CREATININE 2.37* 11/08/2014 0801   CREATININE 2.1* 07/27/2014 0821      Component Value Date/Time   CALCIUM 8.3* 11/29/2014 1344   CALCIUM 8.1* 11/08/2014 0801   CALCIUM 9.2 07/27/2014 0821   ALKPHOS 114 11/29/2014 1344   ALKPHOS 105 11/08/2014 0801   ALKPHOS 117* 07/27/2014 0821   AST 12 11/29/2014 1344   AST 12 11/08/2014 0801   AST 16 07/27/2014 0821   ALT 9 11/29/2014 1344   ALT 10 11/08/2014 0801   ALT 16 07/27/2014 0821   BILITOT <0.20 11/29/2014 1344   BILITOT 0.3 11/08/2014 0801   BILITOT 0.60 07/27/2014 0821     Impression and Plan: [name] is 69 year old gentleman with multifactorial anemia (iron deficiency/renal insufficiency). His Hgb is now up to 10.9 since receiving feraheme and aranesp last month. He is doing well and is asymptomatic at this time.  He states that his blood sugars are better controlled but he has had a few episodes of hypoglycemia.   We will plan to see him back in 3 weeks for labs and follow-up.  He knows to contact us with any questions or concerns. We can certainly see him sooner if need be.   Eliezer Bottom, NP 10/14/20161:17 PM

## 2015-01-01 ENCOUNTER — Encounter: Payer: Self-pay | Admitting: Family

## 2015-01-01 ENCOUNTER — Ambulatory Visit (INDEPENDENT_AMBULATORY_CARE_PROVIDER_SITE_OTHER): Payer: Medicare Other | Admitting: Family

## 2015-01-01 ENCOUNTER — Telehealth: Payer: Self-pay | Admitting: Family

## 2015-01-01 VITALS — BP 154/85 | HR 91 | Temp 97.7°F | Resp 18 | Ht 67.0 in | Wt 157.0 lb

## 2015-01-01 DIAGNOSIS — E1110 Type 2 diabetes mellitus with ketoacidosis without coma: Secondary | ICD-10-CM

## 2015-01-01 DIAGNOSIS — E1165 Type 2 diabetes mellitus with hyperglycemia: Secondary | ICD-10-CM

## 2015-01-01 DIAGNOSIS — E785 Hyperlipidemia, unspecified: Secondary | ICD-10-CM

## 2015-01-01 DIAGNOSIS — I2510 Atherosclerotic heart disease of native coronary artery without angina pectoris: Secondary | ICD-10-CM

## 2015-01-01 DIAGNOSIS — Z0000 Encounter for general adult medical examination without abnormal findings: Secondary | ICD-10-CM

## 2015-01-01 DIAGNOSIS — E1140 Type 2 diabetes mellitus with diabetic neuropathy, unspecified: Secondary | ICD-10-CM

## 2015-01-01 DIAGNOSIS — D509 Iron deficiency anemia, unspecified: Secondary | ICD-10-CM | POA: Diagnosis not present

## 2015-01-01 DIAGNOSIS — E1310 Other specified diabetes mellitus with ketoacidosis without coma: Secondary | ICD-10-CM

## 2015-01-01 DIAGNOSIS — I10 Essential (primary) hypertension: Secondary | ICD-10-CM

## 2015-01-01 DIAGNOSIS — N189 Chronic kidney disease, unspecified: Secondary | ICD-10-CM

## 2015-01-01 DIAGNOSIS — D631 Anemia in chronic kidney disease: Secondary | ICD-10-CM

## 2015-01-01 DIAGNOSIS — IMO0002 Reserved for concepts with insufficient information to code with codable children: Secondary | ICD-10-CM

## 2015-01-01 LAB — BASIC METABOLIC PANEL
BUN: 26 mg/dL — ABNORMAL HIGH (ref 6–23)
CALCIUM: 8.8 mg/dL (ref 8.4–10.5)
CHLORIDE: 109 meq/L (ref 96–112)
CO2: 24 meq/L (ref 19–32)
Creatinine, Ser: 3.12 mg/dL — ABNORMAL HIGH (ref 0.40–1.50)
GFR: 25.7 mL/min — ABNORMAL LOW (ref >60.00)
Glucose, Bld: 115 mg/dL — ABNORMAL HIGH (ref 70–99)
Potassium: 4.8 mEq/L (ref 3.5–5.1)
SODIUM: 141 meq/L (ref 135–145)

## 2015-01-01 LAB — IRON AND TIBC CHCC
%SAT: 36 % (ref 20–55)
IRON: 67 ug/dL (ref 42–163)
TIBC: 188 ug/dL — AB (ref 202–409)
UIBC: 120 ug/dL (ref 117–376)

## 2015-01-01 LAB — HEMOGLOBIN A1C: HEMOGLOBIN A1C: 8.9 % — AB (ref 4.6–6.5)

## 2015-01-01 LAB — FERRITIN CHCC: FERRITIN: 114 ng/mL (ref 22–316)

## 2015-01-01 MED ORDER — AMLODIPINE BESYLATE 10 MG PO TABS: 10.0000 mg | ORAL_TABLET | Freq: Every day | ORAL | Status: DC

## 2015-01-01 MED ORDER — ATORVASTATIN CALCIUM 80 MG PO TABS: 80.0000 mg | ORAL_TABLET | Freq: Every day | ORAL | Status: DC

## 2015-01-01 MED ORDER — LISINOPRIL 10 MG PO TABS: ORAL_TABLET | ORAL | Status: DC

## 2015-01-01 MED ORDER — OMEPRAZOLE 40 MG PO CPDR: 40.0000 mg | DELAYED_RELEASE_CAPSULE | Freq: Every day | ORAL | Status: DC

## 2015-01-01 NOTE — Assessment & Plan Note (Deleted)
Above goal on lipitor, crestor/zetia too expensive

## 2015-01-01 NOTE — Assessment & Plan Note (Signed)
Above goal on lipitor, crestor/zetia too expensive

## 2015-01-01 NOTE — Assessment & Plan Note (Signed)
Advised pt not to take PM meal coverage.  Due to hypoglycemia.  However, overall A1C is up. Advised pt to arrange follow up with endo.

## 2015-01-01 NOTE — Patient Instructions (Addendum)
Continue to hold the evening (dinner) sliding scale of humalog. Schedule follow up with endocrinology at [facility] Increase lisinopril from 10mg  to 20mg . Continue amlodipine 5mg .   Follow up in 1 month for nurse visit BP check and follow up.

## 2015-01-01 NOTE — Progress Notes (Signed)
Pre visit review using our clinic review tool, if applicable. No additional management support is needed unless otherwise documented below in the visit note.,  

## 2015-01-01 NOTE — Assessment & Plan Note (Addendum)
Uncontrolled. Increase lisinopril from 10mg  to 20mg . Follow up in 2 weeks for follow up bmet (Dx HTN) and bp recheck.

## 2015-01-01 NOTE — Progress Notes (Signed)
Subjective:    Patient ID: [name], male    DOB: 08/10/1945, 69 y.o.   MRN: [id number]  HPI  [name] is a 69 yr old male who presents today for follow up.  1) DM2- currently maintained on levemir 12 units and levemir per sliding scale.  Reports that he is trying to take prior to meals.  Reports that he has woken up feeling low 2-3 AM.  When he checks his sugar it is in the 50-60's. Reports that he has been eating a good dinner. Reports that he is trying not to take more than 8 units of humalog before his dinner.  Notes that his blood has still dropped at night. He reports that he is seeing [facility].   Lab Results  Component Value Date   HGBA1C 7.7* 09/25/2014   HGBA1C 9.4* 06/23/2014   HGBA1C 9.5* 02/03/2014   Lab Results  Component Value Date   MICROALBUR 1089.9* 02/03/2014   LDLCALC 129* 11/07/2014   CREATININE 3.2* 12/29/2014   2) HTN- Pt is maintained on amlodipine, lisinopril- not taking prescribed hydralazine due to cost.  BP Readings from Last 3 Encounters:  01/01/15 154/85  12/29/14 157/84  11/29/14 156/72   3) Hyperlipidemia-  Pt is maintained on lipitor 80mg .  Not taking zetia due to cost.   Lab Results  Component Value Date   CHOL 184 11/07/2014   HDL 29.40* 11/07/2014   LDLCALC 129* 11/07/2014   LDLDIRECT 117.0 09/25/2014   TRIG 125.0 11/07/2014   CHOLHDL 6 11/07/2014      Review of Systems See HPI  Past Medical History  Diagnosis Date  . Hypertension   . High cholesterol   . GERD (gastroesophageal reflux disease)   . GI bleed     a. Recurrent GI bleed, tx with IV iron. b. Per heme notes - likely AVMs 04/2012 (tx with cauterization several months ago).  . Orthostatic hypotension     a. Tx with florinef.  . Hematuria     a. Urology note scan from 07/2012: cystoscopy without evidence for bladder lesion, only lateral hypertrophy of posterior urethra, bladder impression from BPH. b. Pt states he had "some tests" scheduled for later in July  2014.  . Stage III chronic kidney disease     a. Stage 3 (DM with complications ->CKD, peripheral neuropathy).  . Anemia, iron deficiency 03/24/2011    a. Recurrent GI bleed, tx with periodic iron infusions.  . 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 Sleep apnea     "had mask; couldn't sleep in it" (09/20/2012)  . Type II diabetes mellitus ([place])     a. Dx 1994, uncontrolled.   . Diabetic peripheral neuropathy ([place]) 2014    foot pain.  . Chronic lower back pain   . Fatty liver     on CT of 11/2010  . AVM (arteriovenous malformation) of duodenum, acquired     egds in 01/2012, 04/2011  . Polyp, colonic     Colonoscopy 01/2012 "benign" polyp  . CAD (coronary artery disease)     a. Cath 09/2012: moderate borderline CAD in mid LAD/small diagonal branch, mild RCA stenosis, to be managed medically   . BPH (benign prostatic hyperplasia)   . Prostate cancer ([place])   . Stroke ([place])   . Peripheral neuropathy ([place])   . 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 Marital Status: Married    Spouse Name: N/A  .  Number of Children: 2  . Years of Education: N/A   Occupational History  . taken out of work due to back    Social History Main Topics  . Smoking status: Current Some Day Smoker -- 1.00 packs/day for 43 years    Types: Cigarettes    Start date: 04/15/1968  . Smokeless tobacco: Never Used     Comment: 07-22--2016  still smoking  . Alcohol Use: No     Comment: 09/20/2012 "Used to; stopped ~ 2009; never had problem w/it"  . Drug Use: No  . Sexual Activity: No   Other Topics Concern  . Not on file   Social History Narrative   Regular exercise: rides horses   Caffeine use: occasionally    Past Surgical History  Procedure Laterality Date  . Shoulder open rotator cuff repair Left 1980's  . Inguinal hernia repair Right 2011  . Lumbar disc surgery  1980's  . Robot assisted laparoscopic radical prostatectomy N/A 01/19/2013    Procedure:  ROBOTIC ASSISTED LAPAROSCOPIC RADICAL PROSTATECTOMY;  Surgeon: Bernestine Amass, MD;  Location: [facility];  Service: Urology;  Laterality: N/A;  . Lymphadenectomy Bilateral 01/19/2013    Procedure: LYMPHADENECTOMY;  Surgeon: Bernestine Amass, MD;  Location: [facility];  Service: Urology;  Laterality: Bilateral;  . Enteroscopy N/A 03/01/2013    Procedure: ENTEROSCOPY;  Surgeon: Inda Castle, MD;  Location: [facility];  Service: Endoscopy;  Laterality: N/A;  . Left heart catheterization with coronary angiogram N/A 09/21/2012    Procedure: LEFT HEART CATHETERIZATION WITH CORONARY ANGIOGRAM;  Surgeon: Peter M Martinique, MD;  Location: [facility] CATH LAB;  Service: Cardiovascular;  Laterality: N/A;    Family History  Problem Relation Age of Onset  . Heart attack Brother     Died at 77  . Stroke Father     Died at 9  . Diabetes Sister   . Diabetes Mother   . Stomach cancer Brother   . Heart attack Sister     No Known Allergies  Current Outpatient Prescriptions on File Prior to Visit  Medication Sig Dispense Refill  . amLODipine (NORVASC) 5 MG tablet Take 1 tablet by mouth daily.    [name] Kitchen aspirin EC 81 MG tablet Take 81 mg by mouth daily.    [name] Kitchen atorvastatin (LIPITOR) 80 MG tablet     . esomeprazole (NEXIUM) 40 MG capsule TAKE 1 CAPSULE (40 MG TOTAL) BY MOUTH DAILY AT 12 NOON.    . fluticasone (FLONASE) 50 MCG/ACT nasal spray Place 2 sprays into both nostrils daily.  11  . gabapentin (NEURONTIN) 300 MG capsule TAKE 1 CAPSULE (300 MG TOTAL) BY MOUTH AT BEDTIME. 30 capsule 0  . Insulin Detemir (LEVEMIR) 100 UNIT/ML Pen Inject 10 Units into the skin daily at 10 pm. (Patient taking differently: Inject 12 Units into the skin daily at 10 pm. ) 15 mL 5  . insulin lispro (HUMALOG) 100 UNIT/ML injection USE AS ADVISED - MAXIMUM 25 UNITS PER DAY. INJECT 7-7-5 UNITS PLUS SSI AS ADVISED. (Patient taking differently: Inject into the skin 3 (three) times daily with meals. USE AS ADVISED - MAXIMUM 25 UNITS PER DAY. INJECT 7----  25 UNITS PLUS SSI AS ADVISED.) 10 mL 0  . Insulin Pen Needle (B-D ULTRAFINE III SHORT PEN) 31G X 8 MM MISC USE AS DIRECTED WITH INSULIN Dx: 250.02 100 each 5  . lisinopril (PRINIVIL,ZESTRIL) 10 MG tablet TAKE 1 TABLET (10 MG TOTAL) BY MOUTH DAILY. 30 tablet 1  . NEXIUM 40 MG capsule  TAKE 1 CAPSULE (40 MG TOTAL) BY MOUTH DAILY AT 12 NOON. 30 capsule 8  . nitroGLYCERIN (NITROSTAT) 0.4 MG SL tablet Place 1 tablet (0.4 mg total) under the tongue every 5 (five) minutes as needed for chest pain (up to 3 doses). 25 tablet 2  . pantoprazole (PROTONIX) 40 MG tablet Take 1 tablet (40 mg total) by mouth daily. 30 tablet 3  . polyvinyl alcohol (LIQUIFILM TEARS) 1.4 % ophthalmic solution Place 1 drop into both eyes daily as needed (dry eyes).    . potassium chloride SA (KLOR-CON M20) 20 MEQ tablet Take 2 tablets (40 mEq total) by mouth once. Take 2 pills a day 60 tablet 6  . Vitamin D, Ergocalciferol, (DRISDOL) 50000 UNITS CAPS capsule Take 50,000 Units by mouth every 7 (seven) days.    . hydrALAZINE (APRESOLINE) 10 MG tablet TAKE 1 TABLET BY MOUTH EVERY 8 HOURS (Patient not taking: Reported on 01/01/2015) 90 tablet 2   No current facility-administered medications on file prior to visit.    BP 154/85 mmHg  Pulse 91  Temp(Src) 97.7 F (36.5 C) (Oral)  Resp 18  Ht 5\' 7"  (1.702 m)  Wt 157 lb (71.215 kg)  BMI 24.58 kg/m2  SpO2 99%       Objective:   Physical Exam  Constitutional: He is oriented to person, place, and time. He appears well-developed and well-nourished. No distress.  HENT:  Head: Normocephalic and atraumatic.  Cardiovascular: Normal rate and regular rhythm.   No murmur heard. Pulmonary/Chest: Effort normal and breath sounds normal. No respiratory distress. He has no wheezes. He has no rales.  Musculoskeletal: He exhibits no edema.  Neurological: He is alert and oriented to person, place, and time.  Skin: Skin is warm and dry.  Psychiatric: He has a normal mood and affect. His  behavior is normal. Thought content normal.          Assessment & Plan:

## 2015-01-04 NOTE — Telephone Encounter (Signed)
Opened in error

## 2015-01-15 ENCOUNTER — Encounter: Payer: Self-pay | Admitting: Family

## 2015-01-19 ENCOUNTER — Ambulatory Visit: Payer: Medicare Other

## 2015-01-19 ENCOUNTER — Other Ambulatory Visit: Payer: Medicare Other

## 2015-01-19 ENCOUNTER — Ambulatory Visit: Payer: Medicare Other | Admitting: Family

## 2015-01-30 ENCOUNTER — Telehealth: Payer: Self-pay | Admitting: *Deleted

## 2015-01-30 ENCOUNTER — Other Ambulatory Visit: Payer: Self-pay | Admitting: Family

## 2015-01-30 NOTE — Telephone Encounter (Signed)
Received fax from [facility] requesting notes/documentation supporting order signed for a brace.  Spoke with company and was told order was for back and knee brace. I requested that she fax [place] a copy of signed order as we do not have copy scanned into pt's record. She will fax to nurse station. Awaiting fax.

## 2015-01-30 NOTE — Telephone Encounter (Signed)
Signed order is from June and is scanned into pt's record. I spoke with pt and he states he never received any brace and is not requesting to use any brace. I spoke with Marissa [phone]) and advised her to disregard previous order. She requests that I fax documentation in writing. Response faxed to [phone] and forwarded to scan department.

## 2015-03-14 ENCOUNTER — Other Ambulatory Visit: Payer: Self-pay | Admitting: Family

## 2015-03-15 ENCOUNTER — Ambulatory Visit (HOSPITAL_BASED_OUTPATIENT_CLINIC_OR_DEPARTMENT_OTHER): Payer: Medicare Other | Admitting: Family

## 2015-03-15 ENCOUNTER — Ambulatory Visit (HOSPITAL_BASED_OUTPATIENT_CLINIC_OR_DEPARTMENT_OTHER): Payer: Medicare Other

## 2015-03-15 ENCOUNTER — Telehealth: Payer: Self-pay

## 2015-03-15 ENCOUNTER — Other Ambulatory Visit (HOSPITAL_BASED_OUTPATIENT_CLINIC_OR_DEPARTMENT_OTHER): Payer: Medicare Other

## 2015-03-15 ENCOUNTER — Encounter: Payer: Self-pay | Admitting: Family

## 2015-03-15 VITALS — BP 160/84 | HR 96 | Temp 97.7°F | Resp 16 | Ht 67.0 in | Wt 154.0 lb

## 2015-03-15 DIAGNOSIS — D500 Iron deficiency anemia secondary to blood loss (chronic): Secondary | ICD-10-CM

## 2015-03-15 DIAGNOSIS — D509 Iron deficiency anemia, unspecified: Secondary | ICD-10-CM | POA: Diagnosis present

## 2015-03-15 DIAGNOSIS — D631 Anemia in chronic kidney disease: Secondary | ICD-10-CM

## 2015-03-15 DIAGNOSIS — N189 Chronic kidney disease, unspecified: Secondary | ICD-10-CM

## 2015-03-15 DIAGNOSIS — N289 Disorder of kidney and ureter, unspecified: Secondary | ICD-10-CM

## 2015-03-15 DIAGNOSIS — K254 Chronic or unspecified gastric ulcer with hemorrhage: Secondary | ICD-10-CM

## 2015-03-15 LAB — RETICULOCYTES
ABS Retic: 76.5 10*3/uL (ref 19.0–186.0)
RBC.: 2.55 MIL/uL — AB (ref 4.22–5.81)
Retic Ct Pct: 3 % — ABNORMAL HIGH (ref 0.4–2.3)

## 2015-03-15 LAB — COMPREHENSIVE METABOLIC PANEL
ALBUMIN: 2.3 g/dL — AB (ref 3.5–5.0)
ALK PHOS: 147 U/L (ref 40–150)
ALT: 12 U/L (ref 0–55)
AST: 11 U/L (ref 5–34)
Anion Gap: 7 mEq/L (ref 3–11)
BUN: 36.9 mg/dL — ABNORMAL HIGH (ref 7.0–26.0)
CHLORIDE: 108 meq/L (ref 98–109)
CO2: 19 mEq/L — ABNORMAL LOW (ref 22–29)
Calcium: 8.7 mg/dL (ref 8.4–10.4)
Creatinine: 3.7 mg/dL (ref 0.7–1.3)
EGFR: 18 mL/min/{1.73_m2} — AB (ref >90)
GLUCOSE: 451 mg/dL — AB (ref 70–140)
POTASSIUM: 5.6 meq/L — AB (ref 3.5–5.1)
SODIUM: 133 meq/L — AB (ref 136–145)
Total Bilirubin: <0.3 mg/dL (ref 0.20–1.20)
Total Protein: 6.3 g/dL — ABNORMAL LOW (ref 6.4–8.3)

## 2015-03-15 LAB — CBC WITH DIFFERENTIAL (CANCER CENTER ONLY)
BASO#: 0 10*3/uL (ref 0.0–0.2)
BASO%: 0.2 % (ref 0.0–2.0)
EOS%: 0 % (ref 0.0–7.0)
Eosinophils Absolute: 0 10*3/uL (ref 0.0–0.5)
HEMATOCRIT: 22 % — AB (ref 38.7–49.9)
HEMOGLOBIN: 7.4 g/dL — AB (ref 13.0–17.1)
LYMPH#: 0.4 10*3/uL — AB (ref 0.9–3.3)
LYMPH%: 3.1 % — ABNORMAL LOW (ref 14.0–48.0)
MCH: 30.2 pg (ref 28.0–33.4)
MCHC: 33.6 g/dL (ref 32.0–35.9)
MCV: 90 fL (ref 82–98)
MONO#: 1 10*3/uL — ABNORMAL HIGH (ref 0.1–0.9)
MONO%: 7.5 % (ref 0.0–13.0)
NEUT%: 89.2 % — ABNORMAL HIGH (ref 40.0–80.0)
NEUTROS ABS: 11.7 10*3/uL — AB (ref 1.5–6.5)
Platelets: 397 10*3/uL (ref 145–400)
RBC: 2.45 10*6/uL — AB (ref 4.20–5.70)
RDW: 13.7 % (ref 11.1–15.7)
WBC: 13.1 10*3/uL — AB (ref 4.0–10.0)

## 2015-03-15 LAB — IRON AND TIBC
%SAT: 15 % — AB (ref 20–55)
IRON: 36 ug/dL — AB (ref 42–163)
TIBC: 248 ug/dL (ref 202–409)
UIBC: 212 ug/dL (ref 117–376)

## 2015-03-15 LAB — CHCC SATELLITE - SMEAR

## 2015-03-15 LAB — FERRITIN: Ferritin: 24 ng/ml (ref 22–316)

## 2015-03-15 MED ORDER — ALTEPLASE 2 MG IJ SOLR
2.0000 mg | Freq: Once | INTRAMUSCULAR | Status: DC | PRN
  Filled 2015-03-15: qty 2

## 2015-03-15 MED ORDER — DARBEPOETIN ALFA 300 MCG/0.6ML IJ SOSY
300.0000 ug | PREFILLED_SYRINGE | Freq: Once | INTRAMUSCULAR | Status: AC
  Administered 2015-03-15: 300 ug via SUBCUTANEOUS

## 2015-03-15 MED ORDER — DARBEPOETIN ALFA 300 MCG/0.6ML IJ SOSY
PREFILLED_SYRINGE | INTRAMUSCULAR | Status: AC
  Filled 2015-03-15: qty 0.6

## 2015-03-15 NOTE — Telephone Encounter (Signed)
Dr Marin Olp aware of panic high creatinine. No new orders. dph

## 2015-03-15 NOTE — Progress Notes (Signed)
Hematology and Oncology Follow Up Visit  [name] [id number] Jul 05, 1945 69 y.o. 03/15/2015   Principle Diagnosis:  1. Iron-deficiency anemia secondary to recurrent gastrointestinal bleeding. 2. 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  Current Therapy:   1. Aranesp 300 mcg subcu as needed for hemoglobin less than 10 2. IV iron as indicated    Interim History:  [name] is here today for a follow-up. He is symptomatic with fatigue, SOB, palpitations, dizziness and chewing ice. He has not been eating well because he states food has no taste. He is staying hydrated. His weight is unchanged.  He received Feraheme and Aranesp in September. His Hgb is down at 7.4 and MCV of 90.  No episodes of bleeding or bruising.  No fever, chills, n/v, cough, rash, chest pain, palpitations, abdominal pain or changes in bowel or bladder habits.  His blood sugars have not been well controlled.  He got a steroid injection in his back yesterday and is feeling a little better. He has some mild "puffiness" in his ankles at times that comes and goes. No tenderness, numbness or tingling in his extremities at this time.   Medications:    Medication List       This list is accurate as of: 03/15/15  6:38 PM.  Always use your most recent med list.               amLODipine 10 MG tablet  Commonly known as:  NORVASC  Take 1 tablet (10 mg total) by mouth daily.     aspirin EC 81 MG tablet  Take 81 mg by mouth daily.     atorvastatin 80 MG tablet  Commonly known as:  LIPITOR  Take 1 tablet (80 mg total) by mouth daily at 6 PM.     fluticasone 50 MCG/ACT nasal spray  Commonly known as:  FLONASE  Place 2 sprays into both nostrils daily.     gabapentin 300 MG capsule  Commonly known as:  NEURONTIN  TAKE 1 CAPSULE (300 MG TOTAL) BY MOUTH AT BEDTIME.     hydrALAZINE 10 MG tablet  Commonly known as:  APRESOLINE  TAKE 1 TABLET BY MOUTH EVERY 8 HOURS     Insulin Detemir  100 UNIT/ML Pen  Commonly known as:  LEVEMIR  Inject 10 Units into the skin daily at 10 pm.     insulin lispro 100 UNIT/ML injection  Commonly known as:  HUMALOG  USE AS ADVISED - MAXIMUM 25 UNITS PER DAY. INJECT 7-7-5 UNITS PLUS SSI AS ADVISED.     Insulin Pen Needle 31G X 8 MM Misc  Commonly known as:  B-D ULTRAFINE III SHORT PEN  USE AS DIRECTED WITH INSULIN Dx: 250.02     lisinopril 20 MG tablet  Commonly known as:  PRINIVIL,ZESTRIL  Take 20 mg by mouth daily.     NEXIUM 40 MG capsule  Generic drug:  esomeprazole  TAKE 1 CAPSULE (40 MG TOTAL) BY MOUTH DAILY AT 12 NOON.     nitroGLYCERIN 0.4 MG SL tablet  Commonly known as:  NITROSTAT  Place 1 tablet (0.4 mg total) under the tongue every 5 (five) minutes as needed for chest pain (up to 3 doses).     omeprazole 40 MG capsule  Commonly known as:  PRILOSEC  Take 1 capsule (40 mg total) by mouth daily.     pantoprazole 40 MG tablet  Commonly known as:  PROTONIX  Take 1 tablet (40 mg total) by  mouth daily.     polyvinyl alcohol 1.4 % ophthalmic solution  Commonly known as:  LIQUIFILM TEARS  Place 1 drop into both eyes daily as needed (dry eyes).     potassium chloride SA 20 MEQ tablet  Commonly known as:  KLOR-CON M20  Take 2 tablets (40 mEq total) by mouth once. Take 2 pills a day     Vitamin D (Ergocalciferol) 50000 units Caps capsule  Commonly known as:  DRISDOL  Take 50,000 Units by mouth every 7 (seven) days.        Allergies: No Known Allergies  Past Medical History, Surgical history, Social history, and Family History were reviewed and updated.  Review of Systems: All other 10 point review of systems is negative.   Physical Exam:  height is 5\' 7"  (1.702 m) and weight is 154 lb (69.854 kg). His oral temperature is 97.7 F (36.5 C). His blood pressure is 160/84 and his pulse is 96. His respiration is 16.   Wt Readings from Last 3 Encounters:  03/15/15 154 lb (69.854 kg)  01/01/15 157 lb (71.215 kg)    12/29/14 155 lb (70.308 kg)    Ocular: Sclerae unicteric, pupils equal, round and reactive to light Ear-nose-throat: Oropharynx clear, dentition fair Lymphatic: No cervical supraclavicular or axillary adenopathy Lungs no rales or rhonchi, good excursion bilaterally Heart regular rate and rhythm, no murmur appreciated Abd soft, nontender, positive bowel sounds, no liver or spleen tip palpated on exam MSK no focal spinal tenderness, no joint edema Neuro: non-focal, well-oriented, appropriate affect  Lab Results  Component Value Date   WBC 13.1* 03/15/2015   HGB 7.4* 03/15/2015   HCT 22.0* 03/15/2015   MCV 90 03/15/2015   PLT 397 03/15/2015   Lab Results  Component Value Date   FERRITIN 24 03/15/2015   IRON 36* 03/15/2015   TIBC 248 03/15/2015   UIBC 212 03/15/2015   IRONPCTSAT 15* 03/15/2015   Lab Results  Component Value Date   RETICCTPCT 3.0* 03/15/2015   RBC 2.45* 03/15/2015   RETICCTABS 76.5 03/15/2015   No results found for: KPAFRELGTCHN, LAMBDASER, KAPLAMBRATIO No results found for: IGGSERUM, IGA, IGMSERUM No results found for: Kathrynn Ducking, MSPIKE, SPEI   Chemistry      Component Value Date/Time   NA 133* 03/15/2015 1040   NA 141 01/01/2015 0930   NA 139 07/27/2014 0821   K 5.6* 03/15/2015 1040   K 4.8 01/01/2015 0930   K 4.0 07/27/2014 0821   CL 109 01/01/2015 0930   CL 106 07/27/2014 0821   CO2 19* 03/15/2015 1040   CO2 24 01/01/2015 0930   CO2 26 07/27/2014 0821   BUN 36.9* 03/15/2015 1040   BUN 26* 01/01/2015 0930   BUN 18 07/27/2014 0821   CREATININE 3.7* 03/15/2015 1040   CREATININE 3.12* 01/01/2015 0930   CREATININE 2.1* 07/27/2014 0821      Component Value Date/Time   CALCIUM 8.7 03/15/2015 1040   CALCIUM 8.8 01/01/2015 0930   CALCIUM 9.2 07/27/2014 0821   ALKPHOS 147 03/15/2015 1040   ALKPHOS 105 11/08/2014 0801   ALKPHOS 117* 07/27/2014 0821   AST 11 03/15/2015 1040   AST 12 11/08/2014  0801   AST 16 07/27/2014 0821   ALT 12 03/15/2015 1040   ALT 10 11/08/2014 0801   ALT 16 07/27/2014 0821   BILITOT <0.30 03/15/2015 1040   BILITOT 0.3 11/08/2014 0801   BILITOT 0.60 07/27/2014 0821     Impression and  Plan: [name] is 69 year old gentleman with multifactorial anemia (iron deficiency/renal insufficiency). His Hgb is down at 7.4 with an MCV of 90. He is symptomatic with fatigue, SOB, chewing ice, palpitations and dizziness.  He will get a dose of Aranesp today.  His iron saturation is 15% with a ferritin of 24. We will schedule his for a dose of Feraheme this week and a second dose 8 days later.    We will plan to see him back in 1 month for labs and follow-up.  He knows to contact us with any questions or concerns. We can certainly see him sooner if need be.   Eliezer Bottom, NP 12/29/20166:38 PM

## 2015-03-15 NOTE — Patient Instructions (Signed)
Darbepoetin Alfa injection What is this medicine? DARBEPOETIN ALFA (dar be POE e tin AL fa) helps your body make more red blood cells. It is used to treat anemia caused by chronic kidney failure and chemotherapy. This medicine may be used for other purposes; ask your health care provider or pharmacist if you have questions. What should I tell my health care provider before I take this medicine? They need to know if you have any of these conditions: -blood clotting disorders or history of blood clots -cancer patient not on chemotherapy -cystic fibrosis -heart disease, such as angina, heart failure, or a history of a heart attack -hemoglobin level of 12 g/dL or greater -high blood pressure -low levels of folate, iron, or vitamin B12 -seizures -an unusual or allergic reaction to darbepoetin, erythropoietin, albumin, hamster proteins, latex, other medicines, foods, dyes, or preservatives -pregnant or trying to get pregnant -breast-feeding How should I use this medicine? This medicine is for injection into a vein or under the skin. It is usually given by a health care professional in a hospital or clinic setting. If you get this medicine at home, you will be taught how to prepare and give this medicine. Do not shake the solution before you withdraw a dose. Use exactly as directed. Take your medicine at regular intervals. Do not take your medicine more often than directed. It is important that you put your used needles and syringes in a special sharps container. Do not put them in a trash can. If you do not have a sharps container, call your pharmacist or healthcare provider to get one. Talk to your pediatrician regarding the use of this medicine in children. While this medicine may be used in children as young as 1 year for selected conditions, precautions do apply. Overdosage: If you think you have taken too much of this medicine contact a poison control center or emergency room at once. NOTE:  This medicine is only for you. Do not share this medicine with others. What if I miss a dose? If you miss a dose, take it as soon as you can. If it is almost time for your next dose, take only that dose. Do not take double or extra doses. What may interact with this medicine? Do not take this medicine with any of the following medications: -epoetin alfa This list may not describe all possible interactions. Give your health care provider a list of all the medicines, herbs, non-prescription drugs, or dietary supplements you use. Also tell them if you smoke, drink alcohol, or use illegal drugs. Some items may interact with your medicine. What should I watch for while using this medicine? Visit your prescriber or health care professional for regular checks on your progress and for the needed blood tests and blood pressure measurements. It is especially important for the doctor to make sure your hemoglobin level is in the desired range, to limit the risk of potential side effects and to give you the best benefit. Keep all appointments for any recommended tests. Check your blood pressure as directed. Ask your doctor what your blood pressure should be and when you should contact him or her. As your body makes more red blood cells, you may need to take iron, folic acid, or vitamin B supplements. Ask your doctor or health care provider which products are right for you. If you have kidney disease continue dietary restrictions, even though this medication can make you feel better. Talk with your doctor or health care professional about the   foods you eat and the vitamins that you take. What side effects may I notice from receiving this medicine? Side effects that you should report to your doctor or health care professional as soon as possible: -allergic reactions like skin rash, itching or hives, swelling of the face, lips, or tongue -breathing problems -changes in vision -chest pain -confusion, trouble speaking  or understanding -feeling faint or lightheaded, falls -high blood pressure -muscle aches or pains -pain, swelling, warmth in the leg -rapid weight gain -severe headaches -sudden numbness or weakness of the face, arm or leg -trouble walking, dizziness, loss of balance or coordination -seizures (convulsions) -swelling of the ankles, feet, hands -unusually weak or tired Side effects that usually do not require medical attention (report to your doctor or health care professional if they continue or are bothersome): -diarrhea -fever, chills (flu-like symptoms) -headaches -nausea, vomiting -redness, stinging, or swelling at site where injected This list may not describe all possible side effects. Call your doctor for medical advice about side effects. You may report side effects to FDA at 1-800-FDA-1088. Where should I keep my medicine? Keep out of the reach of children. Store in a refrigerator between 2 and 8 degrees C (36 and 46 degrees F). Do not freeze. Do not shake. Throw away any unused portion if using a single-dose vial. Throw away any unused medicine after the expiration date. NOTE: This sheet is a summary. It may not cover all possible information. If you have questions about this medicine, talk to your doctor, pharmacist, or health care provider.    2016, [employer]vier/Gold Standard. (2008-02-15 10:23:57)  

## 2015-04-03 ENCOUNTER — Encounter (HOSPITAL_BASED_OUTPATIENT_CLINIC_OR_DEPARTMENT_OTHER): Payer: Self-pay | Admitting: *Deleted

## 2015-04-03 ENCOUNTER — Emergency Department (HOSPITAL_BASED_OUTPATIENT_CLINIC_OR_DEPARTMENT_OTHER): Payer: Commercial Managed Care - HMO

## 2015-04-03 ENCOUNTER — Inpatient Hospital Stay (HOSPITAL_BASED_OUTPATIENT_CLINIC_OR_DEPARTMENT_OTHER)
Admission: EM | Admit: 2015-04-03 | Discharge: 2015-04-07 | DRG: 391 | Disposition: A | Discharge status: Home / Self Care | Payer: Commercial Managed Care - HMO | Attending: Internal Medicine | Admitting: Internal Medicine

## 2015-04-03 ENCOUNTER — Encounter: Payer: Self-pay | Admitting: Family

## 2015-04-03 ENCOUNTER — Ambulatory Visit (INDEPENDENT_AMBULATORY_CARE_PROVIDER_SITE_OTHER): Payer: Commercial Managed Care - HMO | Admitting: Family

## 2015-04-03 VITALS — BP 133/68 | HR 92 | Temp 97.6°F | Resp 18 | Ht 67.0 in | Wt 147.0 lb

## 2015-04-03 DIAGNOSIS — K31819 Angiodysplasia of stomach and duodenum without bleeding: Secondary | ICD-10-CM | POA: Diagnosis not present

## 2015-04-03 DIAGNOSIS — IMO0002 Reserved for concepts with insufficient information to code with codable children: Secondary | ICD-10-CM | POA: Diagnosis present

## 2015-04-03 DIAGNOSIS — Z7982 Long term (current) use of aspirin: Secondary | ICD-10-CM

## 2015-04-03 DIAGNOSIS — H539 Unspecified visual disturbance: Secondary | ICD-10-CM | POA: Diagnosis present

## 2015-04-03 DIAGNOSIS — E1122 Type 2 diabetes mellitus with diabetic chronic kidney disease: Secondary | ICD-10-CM | POA: Diagnosis present

## 2015-04-03 DIAGNOSIS — I10 Essential (primary) hypertension: Secondary | ICD-10-CM | POA: Diagnosis present

## 2015-04-03 DIAGNOSIS — Z6823 Body mass index (BMI) 23.0-23.9, adult: Secondary | ICD-10-CM

## 2015-04-03 DIAGNOSIS — R29700 NIHSS score 0: Secondary | ICD-10-CM | POA: Diagnosis not present

## 2015-04-03 DIAGNOSIS — E1140 Type 2 diabetes mellitus with diabetic neuropathy, unspecified: Secondary | ICD-10-CM | POA: Diagnosis present

## 2015-04-03 DIAGNOSIS — I129 Hypertensive chronic kidney disease with stage 1 through stage 4 chronic kidney disease, or unspecified chronic kidney disease: Secondary | ICD-10-CM | POA: Diagnosis present

## 2015-04-03 DIAGNOSIS — N184 Chronic kidney disease, stage 4 (severe): Secondary | ICD-10-CM

## 2015-04-03 DIAGNOSIS — K5521 Angiodysplasia of colon with hemorrhage: Secondary | ICD-10-CM | POA: Diagnosis present

## 2015-04-03 DIAGNOSIS — M545 Low back pain: Secondary | ICD-10-CM | POA: Diagnosis present

## 2015-04-03 DIAGNOSIS — N179 Acute kidney failure, unspecified: Secondary | ICD-10-CM

## 2015-04-03 DIAGNOSIS — Z8546 Personal history of malignant neoplasm of prostate: Secondary | ICD-10-CM

## 2015-04-03 DIAGNOSIS — E1165 Type 2 diabetes mellitus with hyperglycemia: Secondary | ICD-10-CM

## 2015-04-03 DIAGNOSIS — E1142 Type 2 diabetes mellitus with diabetic polyneuropathy: Secondary | ICD-10-CM | POA: Diagnosis present

## 2015-04-03 DIAGNOSIS — Z794 Long term (current) use of insulin: Secondary | ICD-10-CM

## 2015-04-03 DIAGNOSIS — G4733 Obstructive sleep apnea (adult) (pediatric): Secondary | ICD-10-CM | POA: Diagnosis present

## 2015-04-03 DIAGNOSIS — E440 Moderate protein-calorie malnutrition: Secondary | ICD-10-CM | POA: Diagnosis present

## 2015-04-03 DIAGNOSIS — D631 Anemia in chronic kidney disease: Secondary | ICD-10-CM | POA: Diagnosis present

## 2015-04-03 DIAGNOSIS — D509 Iron deficiency anemia, unspecified: Secondary | ICD-10-CM | POA: Diagnosis present

## 2015-04-03 DIAGNOSIS — K219 Gastro-esophageal reflux disease without esophagitis: Secondary | ICD-10-CM | POA: Diagnosis present

## 2015-04-03 DIAGNOSIS — D649 Anemia, unspecified: Secondary | ICD-10-CM

## 2015-04-03 DIAGNOSIS — I2510 Atherosclerotic heart disease of native coronary artery without angina pectoris: Secondary | ICD-10-CM | POA: Diagnosis present

## 2015-04-03 DIAGNOSIS — I639 Cerebral infarction, unspecified: Secondary | ICD-10-CM | POA: Diagnosis present

## 2015-04-03 DIAGNOSIS — I255 Ischemic cardiomyopathy: Secondary | ICD-10-CM | POA: Diagnosis present

## 2015-04-03 DIAGNOSIS — F17210 Nicotine dependence, cigarettes, uncomplicated: Secondary | ICD-10-CM | POA: Diagnosis present

## 2015-04-03 DIAGNOSIS — G8929 Other chronic pain: Secondary | ICD-10-CM | POA: Diagnosis present

## 2015-04-03 DIAGNOSIS — E872 Acidosis: Secondary | ICD-10-CM | POA: Diagnosis present

## 2015-04-03 DIAGNOSIS — E785 Hyperlipidemia, unspecified: Secondary | ICD-10-CM | POA: Diagnosis present

## 2015-04-03 DIAGNOSIS — D500 Iron deficiency anemia secondary to blood loss (chronic): Secondary | ICD-10-CM | POA: Diagnosis present

## 2015-04-03 DIAGNOSIS — N189 Chronic kidney disease, unspecified: Secondary | ICD-10-CM

## 2015-04-03 DIAGNOSIS — E875 Hyperkalemia: Secondary | ICD-10-CM | POA: Diagnosis present

## 2015-04-03 DIAGNOSIS — K921 Melena: Secondary | ICD-10-CM | POA: Diagnosis present

## 2015-04-03 HISTORY — DX: Angiodysplasia of colon without hemorrhage: K55.20

## 2015-04-03 HISTORY — DX: Anemia, unspecified: D64.9

## 2015-04-03 LAB — CBC WITH DIFFERENTIAL/PLATELET
BASOS PCT: 1 %
Basophils Absolute: 0.1 10*3/uL (ref 0.0–0.1)
EOS ABS: 0.2 10*3/uL (ref 0.0–0.7)
Eosinophils Relative: 3 %
HCT: 20 % — ABNORMAL LOW (ref 39.0–52.0)
HEMOGLOBIN: 6.3 g/dL — AB (ref 13.0–17.0)
Lymphocytes Relative: 16 %
Lymphs Abs: 1 10*3/uL (ref 0.7–4.0)
MCH: 27.5 pg (ref 26.0–34.0)
MCHC: 31.5 g/dL (ref 30.0–36.0)
MCV: 87.3 fL (ref 78.0–100.0)
MONOS PCT: 12 %
Monocytes Absolute: 0.7 10*3/uL (ref 0.1–1.0)
NEUTROS PCT: 67 %
Neutro Abs: 4 10*3/uL (ref 1.7–7.7)
PLATELETS: 435 10*3/uL — AB (ref 150–400)
RBC: 2.29 MIL/uL — ABNORMAL LOW (ref 4.22–5.81)
RDW: 13.4 % (ref 11.5–15.5)
WBC: 5.9 10*3/uL (ref 4.0–10.5)

## 2015-04-03 LAB — URINALYSIS, ROUTINE W REFLEX MICROSCOPIC
Bilirubin Urine: NEGATIVE
GLUCOSE, UA: 100 mg/dL — AB
Ketones, ur: NEGATIVE mg/dL
LEUKOCYTES UA: NEGATIVE
Nitrite: NEGATIVE
PH: 6 (ref 5.0–8.0)
Protein, ur: >300 mg/dL — AB
SPECIFIC GRAVITY, URINE: 1.02 (ref 1.005–1.030)

## 2015-04-03 LAB — COMPREHENSIVE METABOLIC PANEL
ALBUMIN: 2.6 g/dL — AB (ref 3.5–5.0)
ALK PHOS: 120 U/L (ref 38–126)
ALT: 12 U/L — ABNORMAL LOW (ref 17–63)
ANION GAP: 8 (ref 5–15)
AST: 13 U/L — ABNORMAL LOW (ref 15–41)
BUN: 41 mg/dL — ABNORMAL HIGH (ref 6–20)
CHLORIDE: 112 mmol/L — AB (ref 101–111)
CO2: 20 mmol/L — AB (ref 22–32)
Calcium: 8.6 mg/dL — ABNORMAL LOW (ref 8.9–10.3)
Creatinine, Ser: 3.76 mg/dL — ABNORMAL HIGH (ref 0.61–1.24)
GFR calc Af Amer: 18 mL/min — ABNORMAL LOW (ref >60)
GFR calc non Af Amer: 15 mL/min — ABNORMAL LOW (ref >60)
GLUCOSE: 134 mg/dL — AB (ref 65–99)
POTASSIUM: 5.1 mmol/L (ref 3.5–5.1)
SODIUM: 140 mmol/L (ref 135–145)
Total Bilirubin: 0.3 mg/dL (ref 0.3–1.2)
Total Protein: 6.4 g/dL — ABNORMAL LOW (ref 6.5–8.1)

## 2015-04-03 LAB — URINE MICROSCOPIC-ADD ON

## 2015-04-03 LAB — CREATININE, URINE, RANDOM: Creatinine, Urine: 134.7 mg/dL

## 2015-04-03 LAB — SODIUM, URINE, RANDOM: SODIUM UR: 70 mmol/L

## 2015-04-03 LAB — TROPONIN I: Troponin I: <0.03 ng/mL (ref <0.031)

## 2015-04-03 LAB — GLUCOSE, CAPILLARY: GLUCOSE-CAPILLARY: 315 mg/dL — AB (ref 65–99)

## 2015-04-03 LAB — PREPARE RBC (CROSSMATCH)

## 2015-04-03 LAB — POCT HEMOGLOBIN: HEMOGLOBIN: 5.5 g/dL — AB (ref 14.1–18.1)

## 2015-04-03 LAB — OCCULT BLOOD X 1 CARD TO LAB, STOOL: FECAL OCCULT BLD: NEGATIVE

## 2015-04-03 MED ORDER — SODIUM CHLORIDE 0.9 % IV BOLUS (SEPSIS)
500.0000 mL | Freq: Once | INTRAVENOUS | Status: AC
  Administered 2015-04-03: 500 mL via INTRAVENOUS

## 2015-04-03 MED ORDER — DILTIAZEM HCL ER 120 MG PO CP24
120.0000 mg | ORAL_CAPSULE | Freq: Every day | ORAL | Status: DC
  Filled 2015-04-03: qty 1

## 2015-04-03 MED ORDER — INSULIN ASPART 100 UNIT/ML ~~LOC~~ SOLN
0.0000 [IU] | Freq: Three times a day (TID) | SUBCUTANEOUS | Status: DC
  Administered 2015-04-04: 1 [IU] via SUBCUTANEOUS
  Administered 2015-04-05: 2 [IU] via SUBCUTANEOUS
  Administered 2015-04-06: 3 [IU] via SUBCUTANEOUS
  Administered 2015-04-07: 1 [IU] via SUBCUTANEOUS

## 2015-04-03 MED ORDER — INSULIN GLARGINE 100 UNITS/ML SOLOSTAR PEN: 25.0000 [IU] | PEN_INJECTOR | Freq: Every day | SUBCUTANEOUS | Status: DC

## 2015-04-03 MED ORDER — INSULIN ASPART 100 UNIT/ML ~~LOC~~ SOLN
0.0000 [IU] | Freq: Every day | SUBCUTANEOUS | Status: DC
  Administered 2015-04-03: 4 [IU] via SUBCUTANEOUS

## 2015-04-03 MED ORDER — PANTOPRAZOLE SODIUM 40 MG PO TBEC
40.0000 mg | DELAYED_RELEASE_TABLET | Freq: Every day | ORAL | Status: DC
  Administered 2015-04-03 – 2015-04-07 (×5): 40 mg via ORAL
  Filled 2015-04-03 (×5): qty 1

## 2015-04-03 MED ORDER — ATORVASTATIN CALCIUM 80 MG PO TABS
80.0000 mg | ORAL_TABLET | Freq: Every day | ORAL | Status: DC
  Administered 2015-04-04 – 2015-04-06 (×3): 80 mg via ORAL
  Filled 2015-04-03 (×3): qty 1

## 2015-04-03 MED ORDER — ACETAMINOPHEN 325 MG PO TABS
650.0000 mg | ORAL_TABLET | Freq: Four times a day (QID) | ORAL | Status: DC | PRN
  Administered 2015-04-06: 650 mg via ORAL
  Filled 2015-04-03: qty 2

## 2015-04-03 MED ORDER — AMLODIPINE BESYLATE 5 MG PO TABS
5.0000 mg | ORAL_TABLET | Freq: Every day | ORAL | Status: DC
  Administered 2015-04-04 – 2015-04-07 (×4): 5 mg via ORAL
  Filled 2015-04-03 (×4): qty 1

## 2015-04-03 MED ORDER — LISINOPRIL 20 MG PO TABS: 20.0000 mg | ORAL_TABLET | Freq: Every day | ORAL | Status: DC

## 2015-04-03 MED ORDER — GABAPENTIN 300 MG PO CAPS
600.0000 mg | ORAL_CAPSULE | Freq: Every day | ORAL | Status: DC
  Administered 2015-04-04 – 2015-04-06 (×3): 600 mg via ORAL
  Filled 2015-04-03 (×3): qty 2

## 2015-04-03 MED ORDER — ACETAMINOPHEN 650 MG RE SUPP: 650.0000 mg | Freq: Four times a day (QID) | RECTAL | Status: DC | PRN

## 2015-04-03 MED ORDER — INSULIN GLARGINE 100 UNIT/ML ~~LOC~~ SOLN
25.0000 [IU] | Freq: Every day | SUBCUTANEOUS | Status: DC
  Administered 2015-04-03: 25 [IU] via SUBCUTANEOUS
  Filled 2015-04-03 (×2): qty 0.25

## 2015-04-03 MED ORDER — SODIUM CHLORIDE 0.9 % IV SOLN: Freq: Once | INTRAVENOUS | Status: DC

## 2015-04-03 NOTE — ED Notes (Signed)
[facility] to bedside for transport.

## 2015-04-03 NOTE — ED Provider Notes (Signed)
CSN: [id number]     Arrival date & time 04/03/15  1012 History   First MD Initiated Contact with Patient 04/03/15 1028     Chief Complaint  Patient presents with  . Anemia     (Consider location/radiation/quality/duration/timing/severity/associated sxs/prior Treatment) HPI Patient was evaluated by his primary care provider, [name], and referred to the emergency department. He's had increasing dyspnea on exertion and generalized weakness over the last few weeks. He states he's had lightheadedness when standing and has noticed that his stool is black in color. The globe and was checked today and it was 5.5. Patient denies any pain currently. Specifically he denies any chest pain or abdominal pain. No nausea or vomiting. Past Medical History  Diagnosis Date  . Hypertension   . High cholesterol   . GERD (gastroesophageal reflux disease)   . GI bleed     a. Recurrent GI bleed, tx with IV iron. b. Per heme notes - likely AVMs 04/2012 (tx with cauterization several months ago).  . Orthostatic hypotension     a. Tx with florinef.  . Hematuria     a. Urology note scan from 07/2012: cystoscopy without evidence for bladder lesion, only lateral hypertrophy of posterior urethra, bladder impression from BPH. b. Pt states he had "some tests" scheduled for later in July 2014.  . Stage III chronic kidney disease     a. Stage 3 (DM with complications ->CKD, peripheral neuropathy).  . Anemia, iron deficiency 03/24/2011    a. Recurrent GI bleed, tx with periodic iron infusions.  . 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 Sleep apnea     "had mask; couldn't sleep in it" (09/20/2012)  . Type II diabetes mellitus ([place])     a. Dx 1994, uncontrolled.   . Diabetic peripheral neuropathy ([place]) 2014    foot pain.  . Chronic lower back pain   . Fatty liver     on CT of 11/2010  . AVM (arteriovenous malformation) of duodenum, acquired     egds in 01/2012, 04/2011  . Polyp, colonic     Colonoscopy 01/2012  "benign" polyp  . CAD (coronary artery disease)     a. Cath 09/2012: moderate borderline CAD in mid LAD/small diagonal branch, mild RCA stenosis, to be managed medically   . BPH (benign prostatic hyperplasia)   . Prostate cancer ([place])   . Stroke ([place])   . Peripheral neuropathy ([place])   . 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 Shoulder open rotator cuff repair Left 1980's  . Inguinal hernia repair Right 2011  . Lumbar disc surgery  1980's  . Robot assisted laparoscopic radical prostatectomy N/A 01/19/2013    Procedure: ROBOTIC ASSISTED LAPAROSCOPIC RADICAL PROSTATECTOMY;  Surgeon: Bernestine Amass, MD;  Location: [facility];  Service: Urology;  Laterality: N/A;  . Lymphadenectomy Bilateral 01/19/2013    Procedure: LYMPHADENECTOMY;  Surgeon: Bernestine Amass, MD;  Location: [facility];  Service: Urology;  Laterality: Bilateral;  . Enteroscopy N/A 03/01/2013    Procedure: ENTEROSCOPY;  Surgeon: Inda Castle, MD;  Location: [place];  Service: Endoscopy;  Laterality: N/A;  . Left heart catheterization with coronary angiogram N/A 09/21/2012    Procedure: LEFT HEART CATHETERIZATION WITH CORONARY ANGIOGRAM;  Surgeon: Peter M Martinique, MD;  Location: [facility] CATH LAB;  Service: Cardiovascular;  Laterality: N/A;   Family History  Problem Relation Age of Onset  . Heart attack Brother     Died at 21  .  Stroke Father     Died at 60  . Diabetes Sister   . Diabetes Mother   . Stomach cancer Brother   . Heart attack Sister    Social History  Substance Use Topics  . Smoking status: Current Some Day Smoker -- 1.00 packs/day for 43 years    Types: Cigarettes    Start date: 04/15/1968  . Smokeless tobacco: Never Used     Comment: 07-22--2016  still smoking  . Alcohol Use: No     Comment: 09/20/2012 "Used to; stopped ~ 2009; never had problem w/it"    Review of Systems  Constitutional: Positive for fatigue. Negative for fever and chills.   Respiratory: Positive for shortness of breath. Negative for cough.   Cardiovascular: Negative for chest pain, palpitations and leg swelling.  Gastrointestinal: Negative for nausea, vomiting, abdominal pain, diarrhea and blood in stool.  Genitourinary: Negative for dysuria and flank pain.  Musculoskeletal: Negative for back pain, neck pain and neck stiffness.  Skin: Negative for rash and wound.  Neurological: Positive for dizziness, weakness (generalized) and light-headedness. Negative for syncope, numbness and headaches.  All other systems reviewed and are negative.     Allergies  Review of patient's allergies indicates no known allergies.  Home Medications   Prior to Admission medications   Medication Sig Start Date End Date Taking? Authorizing Provider  amLODipine (NORVASC) 10 MG tablet Take 1 tablet (10 mg total) by mouth daily. 01/01/15   [name], NP  aspirin EC 81 MG tablet Take 81 mg by mouth daily.    Historical Provider, MD  atorvastatin (LIPITOR) 80 MG tablet Take 1 tablet (80 mg total) by mouth daily at 6 PM. 01/01/15   [name], NP  diltiazem (DILACOR XR) 120 MG 24 hr capsule Take 120 mg by mouth daily. 03/15/15   Historical Provider, MD  fluticasone (FLONASE) 50 MCG/ACT nasal spray Place 2 sprays into both nostrils daily. 09/21/14   Historical Provider, MD  gabapentin (NEURONTIN) 300 MG capsule TAKE 1 CAPSULE (300 MG TOTAL) BY MOUTH AT BEDTIME. 09/15/14   Volanda Napoleon, MD  hydrALAZINE (APRESOLINE) 10 MG tablet TAKE 1 TABLET BY MOUTH EVERY 8 HOURS 05/16/13   [name], NP  Insulin Detemir (LEVEMIR) 100 UNIT/ML Pen Inject 10 Units into the skin daily at 10 pm. Patient taking differently: Inject 12 Units into the skin daily at 10 pm.  03/23/14   [name], NP  insulin lispro (HUMALOG) 100 UNIT/ML injection USE AS ADVISED - MAXIMUM 25 UNITS PER DAY. INJECT 7-7-5 UNITS PLUS SSI AS ADVISED. Patient taking differently: Inject into the skin 3  (three) times daily with meals. USE AS ADVISED - MAXIMUM 25 UNITS PER DAY. INJECT 7---- 25 UNITS PLUS SSI AS ADVISED. 03/23/14   [name], NP  Insulin Pen Needle (B-D ULTRAFINE III SHORT PEN) 31G X 8 MM MISC USE AS DIRECTED WITH INSULIN Dx: 250.02 12/05/13   [name], NP  lisinopril (PRINIVIL,ZESTRIL) 20 MG tablet Take 20 mg by mouth daily. 02/27/15   Historical Provider, MD  NEXIUM 40 MG capsule TAKE 1 CAPSULE (40 MG TOTAL) BY MOUTH DAILY AT 12 NOON. 03/24/14   Milus Banister, MD  nitroGLYCERIN (NITROSTAT) 0.4 MG SL tablet Place 1 tablet (0.4 mg total) under the tongue every 5 (five) minutes as needed for chest pain (up to 3 doses). 09/22/12   Dayna N Dunn, PA-C  omeprazole (PRILOSEC) 40 MG capsule Take 1 capsule (40 mg total) by mouth daily. 01/01/15  [name], NP  pantoprazole (PROTONIX) 40 MG tablet Take 1 tablet (40 mg total) by mouth daily. 05/24/13   [name], NP  polyvinyl alcohol (LIQUIFILM TEARS) 1.4 % ophthalmic solution Place 1 drop into both eyes daily as needed (dry eyes).    Historical Provider, MD  potassium chloride SA (KLOR-CON M20) 20 MEQ tablet Take 2 tablets (40 mEq total) by mouth once. Take 2 pills a day 05/30/13   Volanda Napoleon, MD  Vitamin D, Ergocalciferol, (DRISDOL) 50000 UNITS CAPS capsule Take 50,000 Units by mouth every 7 (seven) days.    Historical Provider, MD   BP 141/72 mmHg  Pulse 74  Temp(Src) 97.9 F (36.6 C) (Oral)  Resp 20  Ht 5\' 7"  (1.702 m)  Wt 147 lb (66.679 kg)  BMI 23.02 kg/m2  SpO2 100% Physical Exam  Constitutional: He is oriented to person, place, and time. He appears well-developed and well-nourished. No distress.  HENT:  Head: Normocephalic and atraumatic.  Mouth/Throat: Oropharynx is clear and moist. No oropharyngeal exudate.  Eyes: EOM are normal. Pupils are equal, round, and reactive to light.  Neck: Normal range of motion. Neck supple.  Cardiovascular: Normal rate and regular rhythm.  Exam reveals no  gallop and no friction rub.   No murmur heard. Pulmonary/Chest: Effort normal and breath sounds normal. No respiratory distress. He has no wheezes. He has no rales.  Abdominal: Soft. Bowel sounds are normal. He exhibits no distension and no mass. There is no tenderness. There is no rebound and no guarding.  Musculoskeletal: Normal range of motion. He exhibits no edema or tenderness.  No lower extremity swelling or pain. Distal pulses equal and intact.  Neurological: He is alert and oriented to person, place, and time.  Patient is alert and oriented x3 with clear, goal oriented speech. Patient has 5/5 motor in all extremities. Sensation is intact to light touch.  Skin: Skin is warm and dry. No rash noted. No erythema.  Psychiatric: He has a normal mood and affect. His behavior is normal.  Nursing note and vitals reviewed.   ED Course  Procedures (including critical care time) Labs Review Labs Reviewed  CBC WITH DIFFERENTIAL/PLATELET - Abnormal; Notable for the following:    RBC 2.29 (*)    Hemoglobin 6.3 (*)    HCT 20.0 (*)    Platelets 435 (*)    All other components within normal limits  COMPREHENSIVE METABOLIC PANEL - Abnormal; Notable for the following:    Chloride 112 (*)    CO2 20 (*)    Glucose, Bld 134 (*)    BUN 41 (*)    Creatinine, Ser 3.76 (*)    Calcium 8.6 (*)    Total Protein 6.4 (*)    Albumin 2.6 (*)    AST 13 (*)    ALT 12 (*)    GFR calc non Af Amer 15 (*)    GFR calc Af Amer 18 (*)    All other components within normal limits  URINALYSIS, ROUTINE W REFLEX MICROSCOPIC (NOT AT [facility]) - Abnormal; Notable for the following:    Glucose, UA 100 (*)    Hgb urine dipstick SMALL (*)    Protein, ur >300 (*)    All other components within normal limits  URINE MICROSCOPIC-ADD ON - Abnormal; Notable for the following:    Squamous Epithelial / LPF 0-5 (*)    Bacteria, UA RARE (*)    Casts GRANULAR CAST (*)    All other components within normal  limits  TROPONIN I   OCCULT BLOOD X 1 CARD TO LAB, STOOL    Imaging Review Dg Chest 2 View  04/03/2015  CLINICAL DATA:  Anemia.  Cough for 2 weeks EXAM: CHEST  2 VIEW COMPARISON:  09/04/2014 FINDINGS: Normal heart size and mediastinal contours. No acute infiltrate or edema. No effusion or pneumothorax. No acute osseous findings. IMPRESSION: No active cardiopulmonary disease. Electronically Signed   By: Monte Fantasia M.D.   On: 04/03/2015 11:16   I have personally reviewed and evaluated these images and lab results as part of my medical decision-making.   EKG Interpretation   Date/Time:  Tuesday April 03 2015 10:40:31 EST Ventricular Rate:  79 PR Interval:  166 QRS Duration: 94 QT Interval:  401 QTC Calculation: 460 R Axis:   26 Text Interpretation:  Sinus rhythm Confirmed by Lita Mains  MD, Shawnie Nicole  ([id number]) on 04/03/2015 11:14:34 AM      MDM   Final diagnoses:  Symptomatic anemia   Discussed with Dr Marily Memos who will accept pt in transfer to med-surg bed for transfusion for symptomatic anemia. Vital signs remained stable in the emergency department.     Julianne Rice, MD 04/03/15 1302

## 2015-04-03 NOTE — ED Notes (Signed)
Pt to room 11 in w/c, able to stand and walk to bed. Pt reports being seen at his pcp this am for a regular checkup, mentioned to his doctor that he was feeling weak. His pcp checked his hemoglobin and it is 5.5 according to records, pt sent here for eval.

## 2015-04-03 NOTE — Progress Notes (Signed)
70 year old male presenting from the [facility] office to [facility] for symptomatic anemia. Hemoglobin noted at 5.5 initially. Stool guaiac negative. Hemodynamically stable. Will require further workup and transfusion. Patient be transferred to MedSurg bed from [facility] [place]  Linna Darner, MD Family Medicine 04/03/2015, 1:02 PM

## 2015-04-03 NOTE — Assessment & Plan Note (Signed)
Heme + stool, worsening anemia, symptomatic.  Per CMA- pt was staggering walking down the hall and required assistance. He will be brought downstairs to the ER via wheelchair.

## 2015-04-03 NOTE — H&P (Signed)
History and Physical  Patient Name: [name]     EXH:[id number]    DOB: Oct 10, 1945    DOA: 04/03/2015 Referring physician: Julianne Rice, MD PCP: [name], NP      Chief Complaint: Dyspne on exertion  HPI: [name] is a 70 y.o. 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  The patient went to see his PCP today for a routine scheduled appointment, and noted that he had been increasingly short of breath and weak over the last week or 2. Point-of-care hemoglobin was 5.5 and so the patient was referred to the emergency room. He follows with Dr. Marin Olp for monthly Feraheme and Aranesp shots. His last hemoglobin 2 weeks ago was dropping and his ferritin at that time was 24 and he received his iron and Aranesp infusions.  In the ED, he had hemoglobin 6.3 g/dL.  A FOBT was negative. A CXR was normal.  His renal function appeared to be worsening.  [facility] were asked to accept in transfer for observation for symptomatic anemia.  He describes hard black, constipated stools over the last week or so but no hematochezia or hematemesis or other witnessed bleeding.  He normally climbs stairs and does moderate intensity work without dyspnea, but this week can barely walk across the room without dyspnea.    Review of Systems:  All other systems negative except as just noted or noted in the history of present illness.  No Known Allergies  Prior to Admission medications   Medication Sig Start Date End Date Taking? Authorizing Provider  insulin glargine (LANTUS) 100 unit/mL SOPN Inject 25 Units into the skin at bedtime.   Yes Historical Provider, MD  amLODipine (NORVASC) 10 MG tablet Take 1 tablet (10 mg total) by mouth daily. 01/01/15   [name], NP  amLODipine (NORVASC)  5 MG tablet Take 5 mg by mouth daily. 01/14/15   Historical Provider, MD  aspirin EC 81 MG tablet Take 81 mg by mouth daily.    Historical Provider, MD  atorvastatin (LIPITOR) 80 MG tablet Take 1 tablet (80 mg total) by mouth daily at 6 PM. 01/01/15   [name], NP  diltiazem (DILACOR XR) 120 MG 24 hr capsule Take 120 mg by mouth daily. 03/15/15   Historical Provider, MD  fluticasone (FLONASE) 50 MCG/ACT nasal spray Place 2 sprays into both nostrils daily. 09/21/14   Historical Provider, MD  gabapentin (NEURONTIN) 300 MG capsule TAKE 1 CAPSULE (300 MG TOTAL) BY MOUTH AT BEDTIME. 09/15/14   Volanda Napoleon, MD  hydrALAZINE (APRESOLINE) 10 MG tablet TAKE 1 TABLET BY MOUTH EVERY 8 HOURS 05/16/13   [name], NP  Insulin Detemir (LEVEMIR) 100 UNIT/ML Pen Inject 10 Units into the skin daily at 10 pm. Patient taking differently: Inject 12 Units into the skin daily at 10 pm.  03/23/14   [name], NP  insulin lispro (HUMALOG) 100 UNIT/ML injection USE AS ADVISED - MAXIMUM 25 UNITS PER DAY. INJECT 7-7-5 UNITS PLUS SSI AS ADVISED. Patient taking differently: Inject into the skin 3 (three) times daily with meals. USE AS ADVISED - MAXIMUM 25 UNITS PER DAY. INJECT 7---- 25 UNITS PLUS SSI AS ADVISED. 03/23/14   [name], NP  Insulin Pen Needle (B-D ULTRAFINE III SHORT PEN) 31G X 8 MM MISC USE  AS DIRECTED WITH INSULIN Dx: 250.02 12/05/13   [name], NP  lisinopril (PRINIVIL,ZESTRIL) 20 MG tablet Take 20 mg by mouth daily. 02/27/15   Historical Provider, MD  NEXIUM 40 MG capsule TAKE 1 CAPSULE (40 MG TOTAL) BY MOUTH DAILY AT 12 NOON. 03/24/14   Milus Banister, MD  nitroGLYCERIN (NITROSTAT) 0.4 MG SL tablet Place 1 tablet (0.4 mg total) under the tongue every 5 (five) minutes as needed for chest pain (up to 3 doses). 09/22/12   Dayna N Dunn, PA-C  omeprazole (PRILOSEC) 40 MG capsule Take 1 capsule (40 mg total) by mouth daily. 01/01/15   [name], NP  pantoprazole  (PROTONIX) 40 MG tablet Take 1 tablet (40 mg total) by mouth daily. 05/24/13   [name], NP  polyvinyl alcohol (LIQUIFILM TEARS) 1.4 % ophthalmic solution Place 1 drop into both eyes daily as needed (dry eyes).    Historical Provider, MD  potassium chloride SA (KLOR-CON M20) 20 MEQ tablet Take 2 tablets (40 mEq total) by mouth once. Take 2 pills a day 05/30/13   Volanda Napoleon, MD  Vitamin D, Ergocalciferol, (DRISDOL) 50000 UNITS CAPS capsule Take 50,000 Units by mouth every 7 (seven) days.    Historical Provider, MD    Past Medical History  Diagnosis Date  . Hypertension   . High cholesterol   . GERD (gastroesophageal reflux disease)   . GI bleed     a. Recurrent GI bleed, tx with IV iron. b. Per heme notes - likely AVMs 04/2012 (tx with cauterization several months ago).  . Orthostatic hypotension     a. Tx with florinef.  . Hematuria     a. Urology note scan from 07/2012: cystoscopy without evidence for bladder lesion, only lateral hypertrophy of posterior urethra, bladder impression from BPH. b. Pt states he had "some tests" scheduled for later in July 2014.  . Stage III chronic kidney disease     a. Stage 3 (DM with complications ->CKD, peripheral neuropathy).  . Anemia, iron deficiency 03/24/2011    a. Recurrent GI bleed, tx with periodic iron infusions.  . 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 Sleep apnea     "had mask; couldn't sleep in it" (09/20/2012)  . Type II diabetes mellitus ([place])     a. Dx 1994, uncontrolled.   . Diabetic peripheral neuropathy ([place]) 2014    foot pain.  . Chronic lower back pain   . Fatty liver     on CT of 11/2010  . AVM (arteriovenous malformation) of duodenum, acquired     egds in 01/2012, 04/2011  . Polyp, colonic     Colonoscopy 01/2012 "benign" polyp  . CAD (coronary artery disease)     a. Cath 09/2012: moderate borderline CAD in mid LAD/small diagonal branch, mild RCA stenosis, to be managed medically   . BPH (benign prostatic hyperplasia)     . Prostate cancer ([place])   . Stroke ([place])   . Peripheral neuropathy ([place])   . 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 Shoulder open rotator cuff repair Left 1980's  . Inguinal hernia repair Right 2011  . Lumbar disc surgery  1980's  . Robot assisted laparoscopic radical prostatectomy N/A 01/19/2013    Procedure: ROBOTIC ASSISTED LAPAROSCOPIC RADICAL PROSTATECTOMY;  Surgeon: Bernestine Amass, MD;  Location: [facility];  Service: Urology;  Laterality: N/A;  . Lymphadenectomy Bilateral 01/19/2013    Procedure: LYMPHADENECTOMY;  Surgeon: Camelia Eng  Risa Grill, MD;  Location: [facility];  Service: Urology;  Laterality: Bilateral;  . Enteroscopy N/A 03/01/2013    Procedure: ENTEROSCOPY;  Surgeon: Inda Castle, MD;  Location: [place];  Service: Endoscopy;  Laterality: N/A;  . Left heart catheterization with coronary angiogram N/A 09/21/2012    Procedure: LEFT HEART CATHETERIZATION WITH CORONARY ANGIOGRAM;  Surgeon: Peter M Martinique, MD;  Location: [facility] CATH LAB;  Service: Cardiovascular;  Laterality: N/A;    Family history: family history includes Diabetes in his mother and sister; Heart attack in his brother and sister; Stomach cancer in his brother; Stroke in his father.  No family history of GI bleeding.  No family history of anemia.  Social History: Patient lives with his wife.  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  He cares for his two horses, and rides with his wife when he can.  He smokes.  He normally walks around and does moderate work climbs stairs without assistance.       Physical Exam: BP 158/79 mmHg  Pulse 84  Temp(Src) 98.4 F (36.9 C) (Oral)  Resp 20  Ht 5' 7"  (1.702 m)  Wt 66.679 kg (147 lb)  BMI 23.02 kg/m2  SpO2 98% General appearance: Well-developed, adult male, alert and in no acute distress.   Eyes: Anicteric, conjunctiva pink, lids and lashes normal.     ENT: No nasal deformity, discharge,  or epistaxis.  OP moist without lesions.  Dental bridge. Lymph: No cervical, supraclavicular lymphadenopathy. Skin: Warm and dry.  Right elbow abrasion, healing.  Pitted nails. Cardiac: Tachycardic, regular, nl S1-S2, no murmurs appreciated.  Capillary refill is brisk.  JVP normal.  No LE edema.  Radial and DP pulses 2+ and symmetric. Respiratory: Normal respiratory rate and rhythm.  Atelectatic crackles bilaterally, faint wheezes. Abdomen: Abdomen soft without rigidity.  No TTP. No ascites, distension.   MSK: No deformities or effusions. Neuro: Sensorium intact and responding to questions, attention normal.  Speech is fluent.  Moves all extremities equally and with normal coordination.   Cranial nerves normal. Psych: Behavior appropriate.  Affect normal.  No evidence of aural or visual hallucinations or delusions.       Labs on Admission:  The metabolic panel shows mild hyperkalemia, mild low bicarbonate, EGFR 15, steadily declining over the last 6 months. Albumin 2.6 Tranxene increases and bilirubin normal Troponin negative Urinalysis shows granular casts and is otherwise bland. The complete blood count shows hemoglobin 6.3 g/dL without leukocytosis or thrombocytopenia. Fecal occult blood testing from the ER is negative.    Radiological Exams on Admission: Personally reviewed: Dg Chest 2 View 04/03/2015 No airspace opacity   EKG: Independently reviewed. Sinus, rate 79, QTC 460. No ST-T wave changes.   Echocardiogram 2014: Ejection fraction 37-04%, grade 1 diastolic dysfunction. No significant valvular disease.    Assessment/Plan 1. Acute on chronic anemia, iron deficiency and renal disease:  This is worsening.  The patient reports black firm stools, although the FOBT in the ER is negative, I suspect he has had increased bleeding from his AVMs.  No recent NSAIDs. -Transfuse 1 unit now -Post-transfusion H/H -Consult to [place]   2. CKD stage IV with mild acidosis and  hyperkalemia:  The eGFR appears to have been steadily decreasing from 45 --> 15 over the last six months.  Possibly there is an acute component to this?  Patient sees Nephrology regularly in consultation. -Fluid resuscitation -Check urine electrolytes -Hold potassium supplement  3. IDDM:  -Continue home insulin -Sliding  scale corrections  4. CAD, HTN, and ischemic CM:  Normotensive at admission.  History of EF 35-40% in 2014, no echocardiogram since.  No CHF symptoms. -Continue home statin, amlodipine, diltiazem, hydralazine, and lisinopril -I will hold his baby aspirin -Check BNP and repeat CXR if still dyspnea with exertion after transfusion  5. OSA:  Stable. Not on CPAP  6. GERD:  Stable.  -Continue home PPI      DVT PPx: SCDs Diet: Renal Consultants: Nephrology, GI Code Status: Full Family Communication: None  Medical decision making: What exists of the patient's previous chart and CareEverywhere was reviewed in depth and the case was discussed with Dr. Ardis Hughs. Patient seen 8:14 PM on 04/03/2015.  Disposition Plan:  Place in observation for transfusion and consultation with subspeciality service.  At the time of admission, it seems likely that the patient will be stable for discharge safely home within 48 hours.      Edwin Dada [facility] Pager [phone]

## 2015-04-03 NOTE — Progress Notes (Signed)
Subjective:    Patient ID: [name], male    DOB: [birth year], 70 y.o.   MRN: [id number]  HPI  [name] is a 70 yr old male who presents today for follow up.  Anemia- Pt reports + dizziness/fatigue/ weakness x 1 weak. Hemocue 5.5 today.  Patient has known hx of chronic GI blood loss from known intestinal AVM's.  He also has anemia of chronic disease due to renal insufficiency.  He is followed by Hematology- Dr. Delon Sacramento NP who recently checked his Hgb on 12/29. Hemoglobin at that time was 7.4.  He was given dose of aranesp on 03/15/15.   Black stools.  + SOB.  Denies CP.    Lab Results  Component Value Date   WBC 13.1* 03/15/2015   HGB 7.4* 03/15/2015   HCT 22.0* 03/15/2015   MCV 90 03/15/2015   PLT 397 03/15/2015      Review of Systems See HPI  Past Medical History  Diagnosis Date  . Hypertension   . High cholesterol   . GERD (gastroesophageal reflux disease)   . GI bleed     a. Recurrent GI bleed, tx with IV iron. b. Per heme notes - likely AVMs 04/2012 (tx with cauterization several months ago).  . Orthostatic hypotension     a. Tx with florinef.  . Hematuria     a. Urology note scan from 07/2012: cystoscopy without evidence for bladder lesion, only lateral hypertrophy of posterior urethra, bladder impression from BPH. b. Pt states he had "some tests" scheduled for later in July 2014.  . Stage III chronic kidney disease     a. Stage 3 (DM with complications ->CKD, peripheral neuropathy).  . Anemia, iron deficiency 03/24/2011    a. Recurrent GI bleed, tx with periodic iron infusions.  . 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 Sleep apnea     "had mask; couldn't sleep in it" (09/20/2012)  . Type II diabetes mellitus ([place])     a. Dx 1994, uncontrolled.   . Diabetic peripheral neuropathy ([place]) 2014    foot pain.  . Chronic lower back pain   . Fatty liver     on CT of 11/2010  . AVM (arteriovenous malformation) of duodenum, acquired     egds in 01/2012,  04/2011  . Polyp, colonic     Colonoscopy 01/2012 "benign" polyp  . CAD (coronary artery disease)     a. Cath 09/2012: moderate borderline CAD in mid LAD/small diagonal branch, mild RCA stenosis, to be managed medically   . BPH (benign prostatic hyperplasia)   . Prostate cancer ([place])   . Stroke ([place])   . Peripheral neuropathy ([place])   . 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 Marital Status: Married    Spouse Name: N/A  . Number of Children: 2  . Years of Education: N/A   Occupational History  . taken out of work due to back    Social History Main Topics  . Smoking status: Current Some Day Smoker -- 1.00 packs/day for 43 years    Types: Cigarettes    Start date: 04/15/1968  . Smokeless tobacco: Never Used     Comment: 07-22--2016  still smoking  . Alcohol Use: No     Comment: 09/20/2012 "Used to; stopped ~ 2009; never had problem w/it"  . Drug Use: No  . Sexual Activity: No   Other Topics Concern  . Not on file  Social History Narrative   Regular exercise: rides horses   Caffeine use: occasionally    Past Surgical History  Procedure Laterality Date  . Shoulder open rotator cuff repair Left 1980's  . Inguinal hernia repair Right 2011  . Lumbar disc surgery  1980's  . Robot assisted laparoscopic radical prostatectomy N/A 01/19/2013    Procedure: ROBOTIC ASSISTED LAPAROSCOPIC RADICAL PROSTATECTOMY;  Surgeon: Bernestine Amass, MD;  Location: [facility];  Service: Urology;  Laterality: N/A;  . Lymphadenectomy Bilateral 01/19/2013    Procedure: LYMPHADENECTOMY;  Surgeon: Bernestine Amass, MD;  Location: [facility];  Service: Urology;  Laterality: Bilateral;  . Enteroscopy N/A 03/01/2013    Procedure: ENTEROSCOPY;  Surgeon: Inda Castle, MD;  Location: [facility];  Service: Endoscopy;  Laterality: N/A;  . Left heart catheterization with coronary angiogram N/A 09/21/2012    Procedure: LEFT HEART CATHETERIZATION WITH CORONARY ANGIOGRAM;   Surgeon: Peter M Martinique, MD;  Location: [facility] C[facility] LAB;  Service: Cardiovascular;  Laterality: N/A;    Family History  Problem Relation Age of Onset  . Heart attack Brother     Died at 13  . Stroke Father     Died at 31  . Diabetes Sister   . Diabetes Mother   . Stomach cancer Brother   . Heart attack Sister     No Known Allergies  Current Outpatient Prescriptions on File Prior to Visit  Medication Sig Dispense Refill  . amLODipine (NORVASC) 10 MG tablet Take 1 tablet (10 mg total) by mouth daily. 30 tablet 5  . aspirin EC 81 MG tablet Take 81 mg by mouth daily.    [name] Kitchen atorvastatin (LIPITOR) 80 MG tablet Take 1 tablet (80 mg total) by mouth daily at 6 PM. 30 tablet 5  . fluticasone (FLONASE) 50 MCG/ACT nasal spray Place 2 sprays into both nostrils daily.  11  . gabapentin (NEURONTIN) 300 MG capsule TAKE 1 CAPSULE (300 MG TOTAL) BY MOUTH AT BEDTIME. 30 capsule 0  . hydrALAZINE (APRESOLINE) 10 MG tablet TAKE 1 TABLET BY MOUTH EVERY 8 HOURS 90 tablet 2  . Insulin Detemir (LEVEMIR) 100 UNIT/ML Pen Inject 10 Units into the skin daily at 10 pm. (Patient taking differently: Inject 12 Units into the skin daily at 10 pm. ) 15 mL 5  . insulin lispro (HUMALOG) 100 UNIT/ML injection USE AS ADVISED - MAXIMUM 25 UNITS PER DAY. INJECT 7-7-5 UNITS PLUS SSI AS ADVISED. (Patient taking differently: Inject into the skin 3 (three) times daily with meals. USE AS ADVISED - MAXIMUM 25 UNITS PER DAY. INJECT 7---- 25 UNITS PLUS SSI AS ADVISED.) 10 mL 0  . Insulin Pen Needle (B-D ULTRAFINE III SHORT PEN) 31G X 8 MM MISC USE AS DIRECTED WITH INSULIN Dx: 250.02 100 each 5  . lisinopril (PRINIVIL,ZESTRIL) 20 MG tablet Take 20 mg by mouth daily.  5  . NEXIUM 40 MG capsule TAKE 1 CAPSULE (40 MG TOTAL) BY MOUTH DAILY AT 12 NOON. 30 capsule 8  . nitroGLYCERIN (NITROSTAT) 0.4 MG SL tablet Place 1 tablet (0.4 mg total) under the tongue every 5 (five) minutes as needed for chest pain (up to 3 doses). 25 tablet 2  . omeprazole  (PRILOSEC) 40 MG capsule Take 1 capsule (40 mg total) by mouth daily. 30 capsule 5  . pantoprazole (PROTONIX) 40 MG tablet Take 1 tablet (40 mg total) by mouth daily. 30 tablet 3  . polyvinyl alcohol (LIQUIFILM TEARS) 1.4 % ophthalmic solution Place 1 drop into both eyes daily  as needed (dry eyes).    . potassium chloride SA (KLOR-CON M20) 20 MEQ tablet Take 2 tablets (40 mEq total) by mouth once. Take 2 pills a day 60 tablet 6  . Vitamin D, Ergocalciferol, (DRISDOL) 50000 UNITS CAPS capsule Take 50,000 Units by mouth every 7 (seven) days.     No current facility-administered medications on file prior to visit.    BP 133/68 mmHg  Pulse 92  Temp(Src) 97.6 F (36.4 C) (Oral)  Resp 18  Ht 5\' 7"  (1.702 m)  Wt 147 lb (66.679 kg)  BMI 23.02 kg/m2  SpO2 98%       Objective:   Physical Exam  Constitutional: He is oriented to person, place, and time. He appears well-developed and well-nourished.  Weak appearing AA male, NAD  HENT:  Head: Normocephalic and atraumatic.  Cardiovascular: Normal rate and regular rhythm.   No murmur heard. Pulmonary/Chest: Effort normal and breath sounds normal. No respiratory distress. He has no wheezes. He has no rales. He exhibits no tenderness.  Abdominal: Soft. Bowel sounds are normal. He exhibits no distension. There is no tenderness. There is no rebound.  Genitourinary: Guaiac positive stool.  Musculoskeletal: He exhibits no edema.  Neurological: He is alert and oriented to person, place, and time.  Psychiatric: He has a normal mood and affect. His behavior is normal. Thought content normal.          Assessment & Plan:  Spoke to ER physician.  They will accept patient with plan to transfer patient over to [facility] ED for transfusion. I also spoke with Sheryn Bison NP at the Cancer center. They are unable to transfuse the patient today.

## 2015-04-03 NOTE — Progress Notes (Signed)
Pre visit review using our clinic review tool, if applicable. No additional management support is needed unless otherwise documented below in the visit note. 

## 2015-04-03 NOTE — Patient Instructions (Signed)
Please proceed to the ER on the first floor for further evaluation.  

## 2015-04-04 ENCOUNTER — Observation Stay (HOSPITAL_COMMUNITY): Payer: Commercial Managed Care - HMO

## 2015-04-04 DIAGNOSIS — N179 Acute kidney failure, unspecified: Secondary | ICD-10-CM

## 2015-04-04 DIAGNOSIS — D509 Iron deficiency anemia, unspecified: Secondary | ICD-10-CM | POA: Diagnosis not present

## 2015-04-04 DIAGNOSIS — I639 Cerebral infarction, unspecified: Secondary | ICD-10-CM | POA: Diagnosis not present

## 2015-04-04 DIAGNOSIS — D649 Anemia, unspecified: Secondary | ICD-10-CM | POA: Diagnosis not present

## 2015-04-04 DIAGNOSIS — Z8719 Personal history of other diseases of the digestive system: Secondary | ICD-10-CM

## 2015-04-04 DIAGNOSIS — D500 Iron deficiency anemia secondary to blood loss (chronic): Secondary | ICD-10-CM | POA: Diagnosis not present

## 2015-04-04 DIAGNOSIS — N184 Chronic kidney disease, stage 4 (severe): Secondary | ICD-10-CM

## 2015-04-04 DIAGNOSIS — E1140 Type 2 diabetes mellitus with diabetic neuropathy, unspecified: Secondary | ICD-10-CM | POA: Diagnosis not present

## 2015-04-04 DIAGNOSIS — K5520 Angiodysplasia of colon without hemorrhage: Secondary | ICD-10-CM | POA: Diagnosis not present

## 2015-04-04 LAB — BASIC METABOLIC PANEL
ANION GAP: 5 (ref 5–15)
BUN: 37 mg/dL — ABNORMAL HIGH (ref 6–20)
CALCIUM: 8.1 mg/dL — AB (ref 8.9–10.3)
CO2: 21 mmol/L — ABNORMAL LOW (ref 22–32)
CREATININE: 3.52 mg/dL — AB (ref 0.61–1.24)
Chloride: 114 mmol/L — ABNORMAL HIGH (ref 101–111)
GFR, EST AFRICAN AMERICAN: 19 mL/min — AB (ref >60)
GFR, EST NON AFRICAN AMERICAN: 16 mL/min — AB (ref >60)
Glucose, Bld: 79 mg/dL (ref 65–99)
Potassium: 4.7 mmol/L (ref 3.5–5.1)
SODIUM: 140 mmol/L (ref 135–145)

## 2015-04-04 LAB — IRON AND TIBC
IRON: 250 ug/dL — AB (ref 45–182)
Saturation Ratios: 88 % — ABNORMAL HIGH (ref 17.9–39.5)
TIBC: 286 ug/dL (ref 250–450)
UIBC: 36 ug/dL

## 2015-04-04 LAB — GLUCOSE, CAPILLARY
GLUCOSE-CAPILLARY: 104 mg/dL — AB (ref 65–99)
GLUCOSE-CAPILLARY: 137 mg/dL — AB (ref 65–99)
GLUCOSE-CAPILLARY: 66 mg/dL (ref 65–99)
GLUCOSE-CAPILLARY: 91 mg/dL (ref 65–99)
Glucose-Capillary: 58 mg/dL — ABNORMAL LOW (ref 65–99)
Glucose-Capillary: 92 mg/dL (ref 65–99)
Glucose-Capillary: 63 mg/dL — ABNORMAL LOW (ref 65–99)

## 2015-04-04 LAB — FERRITIN: FERRITIN: 19 ng/mL — AB (ref 24–336)

## 2015-04-04 LAB — HEMOGLOBIN AND HEMATOCRIT, BLOOD
HEMATOCRIT: 20.2 % — AB (ref 39.0–52.0)
HEMOGLOBIN: 6.6 g/dL — AB (ref 13.0–17.0)

## 2015-04-04 LAB — PREPARE RBC (CROSSMATCH)

## 2015-04-04 MED ORDER — PEG-KCL-NACL-NASULF-NA ASC-C 100 G PO SOLR
1.0000 | Freq: Once | ORAL | Status: AC
  Administered 2015-04-04: 200 g via ORAL
  Filled 2015-04-04: qty 1

## 2015-04-04 MED ORDER — CARVEDILOL 3.125 MG PO TABS
3.1250 mg | ORAL_TABLET | Freq: Two times a day (BID) | ORAL | Status: DC
  Administered 2015-04-04 – 2015-04-06 (×4): 3.125 mg via ORAL
  Filled 2015-04-04 (×5): qty 1

## 2015-04-04 MED ORDER — SODIUM CHLORIDE 0.9 % IV SOLN: Freq: Once | INTRAVENOUS | Status: DC

## 2015-04-04 MED ORDER — GLUCERNA SHAKE PO LIQD
237.0000 mL | Freq: Three times a day (TID) | ORAL | Status: DC
Start: 2015-04-04 — End: 2015-04-07
  Administered 2015-04-04 – 2015-04-07 (×6): 237 mL via ORAL

## 2015-04-04 MED ORDER — INSULIN GLARGINE 100 UNIT/ML ~~LOC~~ SOLN
20.0000 [IU] | Freq: Every day | SUBCUTANEOUS | Status: DC
  Filled 2015-04-04 (×2): qty 0.2

## 2015-04-04 MED ORDER — GUAIFENESIN-DM 100-10 MG/5ML PO SYRP
5.0000 mL | ORAL_SOLUTION | ORAL | Status: DC | PRN
  Filled 2015-04-04: qty 5

## 2015-04-04 NOTE — Progress Notes (Signed)
CRITICAL VALUE ALERT  Critical value received:  Hgb 6.6  Date of notification:  04/04/2015  Time of notification:  0705  Critical value read back:Yes.    Nurse who received alert:  Arlyss Queen  MD notified (1st page):  Dr. Carles Collet  Time of first page:  [phone]

## 2015-04-04 NOTE — Progress Notes (Signed)
Patient's wife called concerned about patient not eating, decreased PO intake, and has lost about 10 pounds.  MD notified.

## 2015-04-04 NOTE — Progress Notes (Signed)
PROGRESS NOTE  [name] [id number] DOB: 12/11/1945 DOA: 04/03/2015 PCP: [name], NP  Brief History 70 year old male with a history of diabetes mellitus, stroke, iron deficiency anemia, CKD stage IV, ischemic cardiomyopathy presented with one-week history of melanotic stool, dyspnea on exertion, and generalized weakness. The patient saw his PCP on 09/01/2015. Point-of-care hemoglobin was 5.5. The patient was sent to the emergency department where repeat hemoglobin was 6.3. Notably, the patient has been receiving Feraheme and Aranesp through Dr. Marin Olp, last dose approximately 2 wk prior to admission.  Patient also has had a history of duodenal AVMs treated with cauterization February 2014. Assessment/Plan: Symptomatic anemia/chronic blood loss anemia -consulted [facility]--previously followed Dr. Ardis Hughs -04/03/15 FOBT negative -Transfuse 2 additional units PRBC (total 3 units) Acute on chronic renal failure CKD stage IV -Baseline creatinine 3.0-3.2 -Likely secondary to hemodynamic changes -Anticipate improvement with PRBC transfusion Ischemic cardiomyopathy -11/01/2012 echo EF 35-40 percent, grade 1 DD -Clinically compensated Diabetes mellitus type II, uncontrolled -01/01/2015 hemoglobin A1c 8.9 -Continue Lantus--decreased to 20 units due to mild hypoglycemia> -Continue NovoLog sliding scale GERD  -Continue PPI  Dizziness and visual disturbance -new am 04/04/15 -MRI brain -may be related to pt's blood loss enemia -check orthostatics Hypertension -Discontinue diltiazem as the patient is on 2 calcium channel blockers -Continue amlodipine -Add carvedilol in the setting of cardiomyopathy -Discontinue lisinopril in the setting of acute on chronic renal failure  Family Communication:   Pt at beside Disposition Plan:   Home when medically stable     Procedures/Studies: Dg Chest 2 View  04/03/2015  CLINICAL DATA:  Anemia.  Cough for 2 weeks EXAM:  CHEST  2 VIEW COMPARISON:  09/04/2014 FINDINGS: Normal heart size and mediastinal contours. No acute infiltrate or edema. No effusion or pneumothorax. No acute osseous findings. IMPRESSION: No active cardiopulmonary disease. Electronically Signed   By: Monte Fantasia M.D.   On: 04/03/2015 11:16         Subjective: Patient is feeling dizzy this morning. Denies any fevers, chill, chest pain, shortness breath, nausea, vomiting, diarrhea. He is having melanotic stool. No bright red blood. Denies any abdominal pain, hematuria, hematemesis.   Objective: Filed Vitals:   04/03/15 2332 04/04/15 0016 04/04/15 0300 04/04/15 0427  BP: 162/69 152/61 159/78 159/83  Pulse: 98 100 99 98  Temp: 98.2 F (36.8 C) 98.6 F (37 C) 98.2 F (36.8 C) 98.6 F (37 C)  TempSrc: Oral Oral Oral Oral  Resp: 16 16 16 16   Height:      Weight:    66.679 kg (147 lb)  SpO2: 100% 100% 99% 100%    Intake/Output Summary (Last 24 hours) at 04/04/15 [id number] Last data filed at 04/04/15 0600  Gross per 24 hour  Intake    480 ml  Output      0 ml  Net    480 ml   Weight change:  Exam:   General:  Pt is alert, follows commands appropriately, not in acute distress  HEENT: No icterus, No thrush, No neck mass, [place]/AT  Cardiovascular: RRR, S1/S2, no rubs, no gallops  Respiratory: CTA bilaterally, no wheezing, no crackles, no rhonchi  Abdomen: Soft/+BS, non tender, non distended, no guardingNo hepatosplenomegaly ;   Extremities: No edema, No lymphangitis, No petechiae, No rashes, no synovitis  Data Reviewed: Basic Metabolic Panel:  Recent Labs Lab 04/03/15 1040 04/04/15 0551  NA 140 140  K 5.1 4.7  CL 112* 114*  CO2 20*  21*  GLUCOSE 134* 79  BUN 41* 37*  CREATININE 3.76* 3.52*  CALCIUM 8.6* 8.1*   Liver Function Tests:  Recent Labs Lab 04/03/15 1040  AST 13*  ALT 12*  ALKPHOS 120  BILITOT 0.3  PROT 6.4*  ALBUMIN 2.6*   No results for input(s): LIPASE, AMYLASE in the last 168 hours. No  results for input(s): AMMONIA in the last 168 hours. CBC:  Recent Labs Lab 04/03/15 0950 04/03/15 1040 04/04/15 0551  WBC  --  5.9  --   NEUTROABS  --  4.0  --   HGB 5.5* 6.3* 6.6*  HCT  --  20.0* 20.2*  MCV  --  87.3  --   PLT  --  435*  --    Cardiac Enzymes:  Recent Labs Lab 04/03/15 1040  TROPONINI <0.03   BNP: Invalid input(s): POCBNP CBG:  Recent Labs Lab 04/03/15 2138 04/04/15 0742 04/04/15 0810  GLUCAP 315* 58* 104*    No results found for this or any previous visit (from the past 240 hour(s)).   Scheduled Meds: . sodium chloride   Intravenous Once  . sodium chloride   Intravenous Once  . amLODipine  5 mg Oral Daily  . atorvastatin  80 mg Oral q1800  . diltiazem  120 mg Oral Daily  . gabapentin  600 mg Oral QHS  . insulin aspart  0-5 Units Subcutaneous QHS  . insulin aspart  0-9 Units Subcutaneous TID WC  . insulin glargine  20 Units Subcutaneous QHS  . lisinopril  20 mg Oral Daily  . pantoprazole  40 mg Oral Daily   Continuous Infusions:    Shacora Zynda, DO  [facility] Pager [phone]  If 7PM-7AM, please contact night-coverage [email or web address] Password [email or web address] 04/04/2015, 8:33 AM   LOS: 1 day

## 2015-04-04 NOTE — Progress Notes (Signed)
Hypoglycemic Event  CBG: 58  Treatment: carb snack  Symptoms: none  Follow-up CBG: Time: 8:10 CBG Result: 104  Possible Reasons for Event: unknown  Comments/MD notified: yes    [name]

## 2015-04-04 NOTE — Progress Notes (Signed)
Initial Nutrition Assessment  DOCUMENTATION CODES:   Non-severe (moderate) malnutrition in context of chronic illness  INTERVENTION:  Provide Glucerna Shake po TID, each supplement provides 220 kcal and 10 grams of protein.  Encourage adequate PO intake.   NUTRITION DIAGNOSIS:   Malnutrition related to chronic illness as evidenced by energy intake < 75% for > or equal to 1 month, moderate depletions of muscle mass.  GOAL:   Patient will meet greater than or equal to 90% of their needs  MONITOR:   PO intake, Supplement acceptance, Weight trends, Labs, I & O's  REASON FOR ASSESSMENT:   Malnutrition Screening Tool    ASSESSMENT:   70 y.o. 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  Pt reports having a decreased appetite which has been ongoing over the past ~ 1 month. He reports having altered taste perception. No meal completion recorded, however pt reports 50% po intake this AM. Pt reports he has been consume only 2 meals a day at home over the past 1 month which he reports usually eating 3 meals daily. Usual body weight reported be to ~175 lbs. Per Epic weight records, weight loss not found significant. Pt is agreeable to nutritional supplements to aid in caloric and protein needs. RD to order. Pt was encouraged to eat his food at meals.  Limited nutrition-Focused physical exam completed. Findings are no fat depletion, moderate muscle depletion, and no edema.   Labs and medications reviewed.   Diet Order:  Diet Carb Modified Fluid consistency:: Thin; Room service appropriate?: Yes Diet NPO time specified Except for: Ice Chips Diet full liquid Room service appropriate?: Yes; Fluid consistency:: Thin  Skin:  Reviewed, no issues  Last BM:  1/17  Height:   Ht  Readings from Last 1 Encounters:  04/03/15 5\' 7"  (1.702 m)    Weight:   Wt Readings from Last 1 Encounters:  04/04/15 147 lb (66.679 kg)    Ideal Body Weight:  67.2 kg  BMI:  Body mass index is 23.02 kg/(m^2).  Estimated Nutritional Needs:   Kcal:  1900-2100  Protein:  80-90 grams  Fluid:  Per MD  EDUCATION NEEDS:   No education needs identified at this time  Corrin Parker, MS, RD, LDN Pager # [phone] After hours/ weekend pager # [phone]

## 2015-04-04 NOTE — Consult Note (Signed)
Consultation  Referring Provider: [facility] Primary Care Physician:  [name], NP Primary Gastroenterologist:  Dr.Dan Ardis Hughs  Reason for Consultation:  Progressive anemia  HPI: [name] is a 70 y.o. male  Admitted last evening after seen by PCP with c/o increasing weakness, SOB, dizziness,fatigue over past couple weeks. HGb 5.5 by hemocue- had been 7.4 two  Weeks earlier. 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 He is followed by hematology and receives  Aranesp and feraheme periodically. He had both in September , and just had feraheme within past 2 weeks. No oral iron. Pt has had several GI workups in past - last EGD and Colon done per Dr Elita Quick in [facility] for same reasons in 2013- EGD negative, Colons with Diverticulosis and one adenomatous polyp. Capsule endoscopy with small bowel AVM's ([place]). He had enteroscopy in 2014 per dr Deatra Ina wit finding of several jejunal and distal duodenal AVM's which were APC'd. He was also referred to Dr Arsenio Loader at [facility] regarding DBE however he decided not to pursue at that time given all of pt' s comorbidities Pt with AODM,HTN,CKD stage IV, CAD, hx prostate CA, hx CVA  Pt relates intermittent dark stools over past few weeks- no abdominal  pain, change in bowel habits, nausea, dysphagia etc. Takes a baby ASA daily, no NSAIDS. Also relates that he just had a Colonoscopy by "somebody in [facility]" last month and was negative - no polyps    Hgb was 6.3 last pm, 6.6 this am and being transfused Heme negative on admit.   Past Medical History  Diagnosis Date  . Hypertension   . High cholesterol   . GERD (gastroesophageal reflux disease)   . GI bleed     a. Recurrent GI bleed, tx with IV iron. b. Per heme notes - likely AVMs 04/2012 (tx with cauterization several months ago).  . Orthostatic hypotension     a. Tx with florinef.  . Hematuria     a. Urology note scan from  07/2012: cystoscopy without evidence for bladder lesion, only lateral hypertrophy of posterior urethra, bladder impression from BPH. b. Pt states he had "some tests" scheduled for later in July 2014.  . Stage III chronic kidney disease     a. Stage 3 (DM with complications ->CKD, peripheral neuropathy).  . Anemia, iron deficiency 03/24/2011    a. Recurrent GI bleed, tx with periodic iron infusions.  . 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 Sleep apnea     "had mask; couldn't sleep in it" (09/20/2012)  . Type II diabetes mellitus ([place])     a. Dx 1994, uncontrolled.   . Diabetic peripheral neuropathy ([place]) 2014    foot pain.  . Chronic lower back pain   . Fatty liver     on CT of 11/2010  . AVM (arteriovenous malformation) of duodenum, acquired     egds in 01/2012, 04/2011  . Polyp, colonic     Colonoscopy 01/2012 "benign" polyp  . CAD (coronary artery disease)     a. Cath 09/2012: moderate borderline CAD in mid LAD/small diagonal branch, mild RCA stenosis, to be managed medically   . BPH (benign prostatic hyperplasia)   . Prostate cancer ([place])   . Stroke ([place])   . Peripheral neuropathy ([place])   . 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 Shoulder open rotator cuff repair Left 1980's  .  Inguinal hernia repair Right 2011  . Lumbar disc surgery  1980's  . Robot assisted laparoscopic radical prostatectomy N/A 01/19/2013    Procedure: ROBOTIC ASSISTED LAPAROSCOPIC RADICAL PROSTATECTOMY;  Surgeon: Bernestine Amass, MD;  Location: [facility];  Service: Urology;  Laterality: N/A;  . Lymphadenectomy Bilateral 01/19/2013    Procedure: LYMPHADENECTOMY;  Surgeon: Bernestine Amass, MD;  Location: [facility];  Service: Urology;  Laterality: Bilateral;  . Enteroscopy N/A 03/01/2013    Procedure: ENTEROSCOPY;  Surgeon: Inda Castle, MD;  Location: [place];  Service: Endoscopy;  Laterality: N/A;  . Left heart catheterization with coronary angiogram  N/A 09/21/2012    Procedure: LEFT HEART CATHETERIZATION WITH CORONARY ANGIOGRAM;  Surgeon: Peter M Martinique, MD;  Location: [facility] CATH LAB;  Service: Cardiovascular;  Laterality: N/A;    Prior to Admission medications   Medication Sig Start Date End Date Taking? Authorizing Provider  amLODipine (NORVASC) 5 MG tablet Take 5 mg by mouth daily. 01/14/15  Yes Historical Provider, MD  aspirin EC 81 MG tablet Take 81 mg by mouth daily.   Yes Historical Provider, MD  atorvastatin (LIPITOR) 80 MG tablet Take 1 tablet (80 mg total) by mouth daily at 6 PM. Patient taking differently: Take 80 mg by mouth daily.  01/01/15  Yes [name], NP  diltiazem (DILACOR XR) 120 MG 24 hr capsule Take 120 mg by mouth daily. 03/15/15  Yes Historical Provider, MD  fluticasone (FLONASE) 50 MCG/ACT nasal spray Place 2 sprays into both nostrils daily as needed (congestion).  09/21/14  Yes Historical Provider, MD  gabapentin (NEURONTIN) 300 MG capsule TAKE 1 CAPSULE (300 MG TOTAL) BY MOUTH AT BEDTIME. Patient taking differently: Take 600 mg by mouth 3 (three) times daily.  09/15/14  Yes Volanda Napoleon, MD  insulin glargine (LANTUS) 100 unit/mL SOPN Inject 25 Units into the skin at bedtime.   Yes Historical Provider, MD  insulin lispro (HUMALOG) 100 UNIT/ML injection USE AS ADVISED - MAXIMUM 25 UNITS PER DAY. INJECT 7-7-5 UNITS PLUS SSI AS ADVISED. Patient taking differently: Inject 8 Units into the skin 3 (three) times daily as needed for high blood sugar (CBG >260).  03/23/14  Yes [name], NP  lisinopril (PRINIVIL,ZESTRIL) 20 MG tablet Take 20 mg by mouth daily. 02/27/15  Yes Historical Provider, MD  Multiple Vitamin (MULTIVITAMIN WITH MINERALS) TABS tablet Take 1 tablet by mouth daily.   Yes Historical Provider, MD  nitroGLYCERIN (NITROSTAT) 0.4 MG SL tablet Place 1 tablet (0.4 mg total) under the tongue every 5 (five) minutes as needed for chest pain (up to 3 doses). 09/22/12  Yes Dayna N Dunn, PA-C  omeprazole  (PRILOSEC) 40 MG capsule Take 1 capsule (40 mg total) by mouth daily. 01/01/15  Yes [name], NP  polyvinyl alcohol (LIQUIFILM TEARS) 1.4 % ophthalmic solution Place 1 drop into both eyes daily as needed (dry eyes).   Yes Historical Provider, MD  potassium chloride SA (KLOR-CON M20) 20 MEQ tablet Take 2 tablets (40 mEq total) by mouth once. Take 2 pills a day Patient taking differently: Take 20 mEq by mouth 2 (two) times daily.  05/30/13  Yes Volanda Napoleon, MD  Vitamin D, Ergocalciferol, (DRISDOL) 50000 UNITS CAPS capsule Take 50,000 Units by mouth every 7 (seven) days. On Mondays   Yes Historical Provider, MD  hydrALAZINE (APRESOLINE) 10 MG tablet TAKE 1 TABLET BY MOUTH EVERY 8 HOURS Patient not taking: Reported on 04/03/2015 05/16/13   [name], NP  Insulin Pen Needle (B-D ULTRAFINE  III SHORT PEN) 31G X 8 MM MISC USE AS DIRECTED WITH INSULIN Dx: 250.02 12/05/13   [name], NP  pantoprazole (PROTONIX) 40 MG tablet Take 1 tablet (40 mg total) by mouth daily. Patient not taking: Reported on 04/03/2015 05/24/13   [name], NP    Current Facility-Administered Medications  Medication Dose Route Frequency Provider Last Rate Last Dose  . 0.9 %  sodium chloride infusion   Intravenous Once Edwin Dada, MD      . 0.9 %  sodium chloride infusion   Intravenous Once Orson Eva, MD      . acetaminophen (TYLENOL) tablet 650 mg  650 mg Oral Q6H PRN Edwin Dada, MD       Or  . acetaminophen (TYLENOL) suppository 650 mg  650 mg Rectal Q6H PRN Edwin Dada, MD      . amLODipine (NORVASC) tablet 5 mg  5 mg Oral Daily Edwin Dada, MD      . atorvastatin (LIPITOR) tablet 80 mg  80 mg Oral q1800 Edwin Dada, MD      . carvedilol (COREG) tablet 3.125 mg  3.125 mg Oral BID WC Orson Eva, MD      . gabapentin (NEURONTIN) capsule 600 mg  600 mg Oral QHS Edwin Dada, MD      . guaiFENesin-dextromethorphan (ROBITUSSIN DM) 100-10  MG/5ML syrup 5 mL  5 mL Oral Q4H PRN Gardiner Barefoot, NP      . insulin aspart (novoLOG) injection 0-5 Units  0-5 Units Subcutaneous QHS Edwin Dada, MD   4 Units at 04/03/15 2327  . insulin aspart (novoLOG) injection 0-9 Units  0-9 Units Subcutaneous TID WC Edwin Dada, MD   0 Units at 04/04/15 0800  . insulin glargine (LANTUS) injection 20 Units  20 Units Subcutaneous QHS Orson Eva, MD      . pantoprazole (PROTONIX) EC tablet 40 mg  40 mg Oral Daily Edwin Dada, MD   40 mg at 04/03/15 2108    Allergies as of 04/03/2015  . (No Known Allergies)    Family History  Problem Relation Age of Onset  . Heart attack Brother     Died at 75  . Stroke Father     Died at 49  . Diabetes Sister   . Diabetes Mother   . Stomach cancer Brother   . Heart attack Sister     Social History   Social History  . Marital Status: Married    Spouse Name: N/A  . Number of Children: 2  . Years of Education: N/A   Occupational History  . taken out of work due to back    Social History Main Topics  . Smoking status: Current Some Day Smoker -- 1.00 packs/day for 43 years    Types: Cigarettes    Start date: 04/15/1968  . Smokeless tobacco: Never Used     Comment: 07-22--2016  still smoking  . Alcohol Use: No     Comment: 09/20/2012 "Used to; stopped ~ 2009; never had problem w/it"  . Drug Use: No  . Sexual Activity: No   Other Topics Concern  . Not on file   Social History Narrative   Regular exercise: rides horses   Caffeine use: occasionally    Review of Systems: Pertinent positive and negative review of systems were noted in the above HPI section.  All other review of systems was otherwise negative.  Physical Exam: Vital signs in last 24 hours:  Temp:  [97.6 F (36.4 C)-98.8 F (37.1 C)] 98.7 F (37.1 C) (01/18 0910) Pulse Rate:  [74-100] 97 (01/18 0840) Resp:  [16-20] 16 (01/18 0910) BP: (133-162)/(61-83) 156/78 mmHg (01/18 0910) SpO2:  [97 %-100 %]  97 % (01/18 0910) Weight:  [147 lb (66.679 kg)] 147 lb (66.679 kg) (01/18 0427) Last BM Date: 04/02/15 General:   Alert,  Well-developed,AA male in NAD, pleasant Head:  Normocephalic and atraumatic. Eyes:  Sclera clear, no icterus.   Conjunctiva pale Ears:  Normal auditory acuity. Nose:  No deformity, discharge,  or lesions. Mouth:  No deformity or lesions.   Neck:  Supple; no masses or thyromegaly. Lungs:  Clear throughout to auscultation.   No wheezes, crackles, or rhonchi. Heart:  Regular rate and rhythm; no murmurs, clicks, rubs,  or gallops. Abdomen:  Soft,nontender, BS active,nonpalp mass or hsm.   Rectal:  Deferred - documented heme negative Msk:  Symmetrical without gross deformities. . Pulses:  Normal pulses noted. Extremities:  Without clubbing or edema. Neurologic:  Alert and  oriented x4;  grossly normal neurologically. Skin:  Intact without significant lesions or rashes.. Psych:  Alert and cooperative. Normal mood and affect.  Intake/O Lab Results:  Recent Labs  04/03/15 0950 04/03/15 1040 04/04/15 0551  WBC  --  5.9  --   HGB 5.5* 6.3* 6.6*  HCT  --  20.0* 20.2*  PLT  --  435*  --    BMET  Recent Labs  04/03/15 1040 04/04/15 0551  NA 140 140  K 5.1 4.7  CL 112* 114*  CO2 20* 21*  GLUCOSE 134* 79  BUN 41* 37*  CREATININE 3.76* 3.52*  CALCIUM 8.6* 8.1*   LFT  Recent Labs  04/03/15 1040  PROT 6.4*  ALBUMIN 2.6*  AST 13*  ALT 12*  ALKPHOS 120  BILITOT 0.3    IMPRESSION:   #1  70 yo male with long history of iron deficiency anemia, multifactorial secondary to stage IV chronic kidney disease and previously documented small bowel AVMs, now admitted with progressive symptomatic anemia and complaints of intermittent dark stools over the past few weeks. I suspect he has been intermittently bleeding from small bowel AVMs. Patient states that he just had a colonoscopy last month, in [facility] which was negative. Prior EGDs have been negative and  has been on PPI.  #2 stage IV chronic kidney disease #3 adult-onset diabetes mellitus #4 hypertension #5 coronary artery disease  #6 history of prior CVA #7 history of prostate cancer  PLAN: #1 patient is currently being transfused with transfuse to hemoglobin of 8-9 given symptomatic with anemia #2  continue Aranesp and Feraheme infusions as per hematology #3 we will discuss pursuing capsule endoscopy to document whether he has more diffuse small bowel AVMs or primarily proximal small bowel AVMs which could be amenable to treatment  with APC #4 will attempt to obtain copy of recent Colonoscopy.   Discussed and have scheduled for Capsule endoscopy tomorrow.  Amy Esterwood  04/04/2015, 9:29 AM    [facility] Attending   I have taken an interval history, reviewed the chart and examined the patient. I agree with the Advanced Practitioner's note, impression and recommendations.   Plan for SBCE to determine extent of AVM"s to see if ablative Tx reasonable.  Gatha Mayer, MD, [facility] [phone] (pager) [phone] after 5 PM, weekends and holidays  04/04/2015 5:21 PM

## 2015-04-05 ENCOUNTER — Encounter (HOSPITAL_COMMUNITY)
Admission: EM | Disposition: A | Discharge status: Home / Self Care | Payer: Self-pay | Source: Home / Self Care | Attending: Internal Medicine

## 2015-04-05 DIAGNOSIS — D500 Iron deficiency anemia secondary to blood loss (chronic): Secondary | ICD-10-CM | POA: Diagnosis not present

## 2015-04-05 DIAGNOSIS — D509 Iron deficiency anemia, unspecified: Secondary | ICD-10-CM | POA: Diagnosis not present

## 2015-04-05 DIAGNOSIS — I639 Cerebral infarction, unspecified: Secondary | ICD-10-CM

## 2015-04-05 DIAGNOSIS — Z8719 Personal history of other diseases of the digestive system: Secondary | ICD-10-CM | POA: Diagnosis not present

## 2015-04-05 DIAGNOSIS — E440 Moderate protein-calorie malnutrition: Secondary | ICD-10-CM | POA: Diagnosis present

## 2015-04-05 DIAGNOSIS — K5520 Angiodysplasia of colon without hemorrhage: Secondary | ICD-10-CM | POA: Diagnosis not present

## 2015-04-05 DIAGNOSIS — N179 Acute kidney failure, unspecified: Secondary | ICD-10-CM | POA: Diagnosis not present

## 2015-04-05 DIAGNOSIS — D649 Anemia, unspecified: Secondary | ICD-10-CM | POA: Diagnosis not present

## 2015-04-05 DIAGNOSIS — K921 Melena: Secondary | ICD-10-CM | POA: Diagnosis present

## 2015-04-05 HISTORY — PX: GIVENS CAPSULE STUDY: SHX5432

## 2015-04-05 LAB — TYPE AND SCREEN
ABO/RH(D): AB POS
ANTIBODY SCREEN: NEGATIVE
UNIT DIVISION: 0
UNIT DIVISION: 0
UNIT DIVISION: 0

## 2015-04-05 LAB — CBC
HCT: 26.9 % — ABNORMAL LOW (ref 39.0–52.0)
HEMOGLOBIN: 9 g/dL — AB (ref 13.0–17.0)
MCH: 28 pg (ref 26.0–34.0)
MCHC: 33.5 g/dL (ref 30.0–36.0)
MCV: 83.8 fL (ref 78.0–100.0)
Platelets: 309 10*3/uL (ref 150–400)
RBC: 3.21 MIL/uL — ABNORMAL LOW (ref 4.22–5.81)
RDW: 15 % (ref 11.5–15.5)
WBC: 6.5 10*3/uL (ref 4.0–10.5)

## 2015-04-05 LAB — GLUCOSE, CAPILLARY
GLUCOSE-CAPILLARY: 160 mg/dL — AB (ref 65–99)
GLUCOSE-CAPILLARY: 193 mg/dL — AB (ref 65–99)
Glucose-Capillary: 60 mg/dL — ABNORMAL LOW (ref 65–99)
Glucose-Capillary: 179 mg/dL — ABNORMAL HIGH (ref 65–99)
Glucose-Capillary: 103 mg/dL — ABNORMAL HIGH (ref 65–99)
Glucose-Capillary: 77 mg/dL (ref 65–99)

## 2015-04-05 LAB — TSH: TSH: 1.235 u[IU]/mL (ref 0.350–4.500)

## 2015-04-05 SURGERY — IMAGING PROCEDURE, GI TRACT, INTRALUMINAL, VIA CAPSULE
Anesthesia: LOCAL

## 2015-04-05 MED ORDER — INSULIN GLARGINE 100 UNIT/ML ~~LOC~~ SOLN
10.0000 [IU] | Freq: Every day | SUBCUTANEOUS | Status: DC
  Administered 2015-04-05 – 2015-04-06 (×2): 10 [IU] via SUBCUTANEOUS
  Filled 2015-04-05 (×3): qty 0.1

## 2015-04-05 MED ORDER — DEXTROSE 50 % IV SOLN
INTRAVENOUS | Status: AC
  Administered 2015-04-05: 50 mL
  Filled 2015-04-05: qty 50

## 2015-04-05 SURGICAL SUPPLY — 1 items: TOWEL COTTON PACK 4EA (MISCELLANEOUS) ×4 IMPLANT

## 2015-04-05 NOTE — Progress Notes (Addendum)
PROGRESS NOTE  [name] [id number] DOB: 05-26-1945 DOA: 04/03/2015 PCP: [name], NP  Brief History 70 year old male with a history of diabetes mellitus, stroke, iron deficiency anemia, CKD stage IV, ischemic cardiomyopathy presented with one-week history of melanotic stool, dyspnea on exertion, and generalized weakness. The patient saw his PCP on 09/01/2015. Point-of-care hemoglobin was 5.5. The patient was sent to the emergency department where repeat hemoglobin was 6.3. Notably, the patient has been receiving Feraheme and Aranesp through Dr. Marin Olp, last dose approximately 2 wk prior to admission.Last EGD and Colon done per Dr Elita Quick in [facility] for same reasons in 2013- EGD negative, Colons with Diverticulosis and one adenomatous polyp. Patient also has had He had enteroscopy in 2014 per Dr.  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  [facility] was consulted and pt underwent capsule endoscopy on 04/05/15. On the morning of 04/04/15, the pt woke up with dizziness and blurry vision. Assessment/Plan: Symptomatic anemia/chronic blood loss anemia/melena -Appreciate [facility]--previously followed Dr. Ardis Hughs -04/03/15 FOBT negative -Transfuse 2 additional units PRBC (total 3 units) -04/05/15--capsule endoscopy Acute Nonhemorrhagic stroke -04/04/2015 MRI brain--acute left parietal periventricular infarct with widespread microhemorrhages -The patient woke up on the morning of 04/04/2015 with blurry vision and dizziness -not tPA candidate due to GIB -consulted neurology -carotid duplex -echo -LDL -HbA1c -PT/OT Acute on chronic renal failure CKD stage IV -Baseline creatinine 3.0-3.2 -Likely secondary to hemodynamic changes -Anticipate improvement with PRBC transfusion Ischemic cardiomyopathy -11/01/2012 echo EF 35-40 percent, grade 1 DD -Clinically compensated Diabetes mellitus type II, uncontrolled -01/01/2015  hemoglobin A1c 8.9 -Continue Lantus--decreased to 10 units due to mild hypoglycemia> -Continue NovoLog sliding scale GERD  -Continue PPI  Hypertension -Discontinue diltiazem as the patient is on 2 calcium channel blockers -Continue amlodipine -Added carvedilol in the setting of cardiomyopathy -Discontinue lisinopril in the setting of acute on chronic renal failure Tobacco abuse -Patient continues to smoke -Tobacco cessation discussed  Family Communication: Pt at beside Disposition Plan: Home when medically stable    Procedures/Studies: Dg Chest 2 View  04/03/2015  CLINICAL DATA:  Anemia.  Cough for 2 weeks EXAM: CHEST  2 VIEW COMPARISON:  09/04/2014 FINDINGS: Normal heart size and mediastinal contours. No acute infiltrate or edema. No effusion or pneumothorax. No acute osseous findings. IMPRESSION: No active cardiopulmonary disease. Electronically Signed   By: Monte Fantasia M.D.   On: 04/03/2015 11:16   [name] Contrast  04/04/2015  CLINICAL DATA:  Dizziness with new visual disturbance. EXAM: MRI HEAD WITHOUT CONTRAST TECHNIQUE: Multiplanar, multiecho pulse sequences of the brain and surrounding structures were obtained without intravenous contrast. COMPARISON:  03/23/2013 CT Head. FINDINGS: There is a small area of restricted diffusion in the LEFT parietal periventricular white matter, 5 mm in diameter, consistent with acute infarction. This lies within the LEFT MCA territory. No other areas of restricted diffusion are seen. No acute hemorrhage, mass lesion, or extra-axial fluid. Hydrocephalus ex vacuo. Extensive white matter disease affects the periventricular and subcortical regions, also affecting the brainstem. Scattered areas of lacunar infarction are noted, widespread. Numerous foci of chronic hemorrhage are seen throughout the white matter, and deep nuclei, brainstem, and cerebellum. Largest area involves the LEFT basal ganglia specifically LEFT caudate greater than  lentiform nucleus. These hemosiderin deposits are consistent with chronic hypertensive cerebrovascular disease. Cerebral amyloid angiopathy less favored. No indication that the acute abnormality is hemorrhagic. Unremarkable pituitary and cerebellar tonsils. Cervical spondylosis  with incompletely visualized disc herniations at C3-4 and C4-5 resulting in mild cord flattening. Flow voids are maintained. No superficial siderosis or intravascular effect of Feraheme. Extracranial soft tissues unremarkable. IMPRESSION: Acute LEFT parietal periventricular deep white matter infarct, nonhemorrhagic. Widespread foci of microhemorrhage throughout the brain, likely sequelae of hypertensive cerebrovascular disease. Advanced atrophy with extensive small vessel disease along with scattered areas of lacunar infarction. Cervical spondylosis. Electronically Signed   By: Staci Righter M.D.   On: 04/04/2015 17:14         Subjective: Patient denies fevers, chills, headache, chest pain, dyspnea, nausea, vomiting, diarrhea, abdominal pain, dysuria, hematuria. Blurry vision and dizziness have resolved   Objective: Filed Vitals:   04/04/15 2100 04/05/15 0500 04/05/15 0909 04/05/15 0938  BP: 172/85 170/87 163/92   Pulse: 92 91 83   Temp: 98.6 F (37 C) 98.6 F (37 C) 98.8 F (37.1 C)   TempSrc: Oral Oral Oral   Resp: 16 16 18    Height:    5\' 7"  (1.702 m)  Weight: 65.6 kg (144 lb 10 oz)   65.318 kg (144 lb)  SpO2: 95% 97% 98%     Intake/Output Summary (Last 24 hours) at 04/05/15 1623 Last data filed at 04/05/15 1400  Gross per 24 hour  Intake    600 ml  Output    950 ml  Net   -350 ml   Weight change: -1.079 kg (-2 lb 6.1 oz) Exam:   General:  Pt is alert, follows commands appropriately, not in acute distress  HEENT: No icterus, No thrush, No neck mass, [place]/AT  Cardiovascular: RRR, S1/S2, no rubs, no gallops  Respiratory: Bibasilar crackles. No wheeze. Good air movement  Abdomen: Soft/+BS, non  tender, non distended, no guarding; no hepatosplenomegaly  Extremities: No edema, No lymphangitis, No petechiae, No rashes, no synovitis; clubbing without cyanosis  Data Reviewed: Basic Metabolic Panel:  Recent Labs Lab 04/03/15 1040 04/04/15 0551  NA 140 140  K 5.1 4.7  CL 112* 114*  CO2 20* 21*  GLUCOSE 134* 79  BUN 41* 37*  CREATININE 3.76* 3.52*  CALCIUM 8.6* 8.1*   Liver Function Tests:  Recent Labs Lab 04/03/15 1040  AST 13*  ALT 12*  ALKPHOS 120  BILITOT 0.3  PROT 6.4*  ALBUMIN 2.6*   No results for input(s): LIPASE, AMYLASE in the last 168 hours. No results for input(s): AMMONIA in the last 168 hours. CBC:  Recent Labs Lab 04/03/15 0950 04/03/15 1040 04/04/15 0551 04/05/15 0557  WBC  --  5.9  --  6.5  NEUTROABS  --  4.0  --   --   HGB 5.5* 6.3* 6.6* 9.0*  HCT  --  20.0* 20.2* 26.9*  MCV  --  87.3  --  83.8  PLT  --  435*  --  309   Cardiac Enzymes:  Recent Labs Lab 04/03/15 1040  TROPONINI <0.03   BNP: Invalid input(s): POCBNP CBG:  Recent Labs Lab 04/04/15 2222 04/05/15 0512 04/05/15 0550 04/05/15 0734 04/05/15 1141  GLUCAP 91 60* 179* 103* 77    No results found for this or any previous visit (from the past 240 hour(s)).   Scheduled Meds: . sodium chloride   Intravenous Once  . sodium chloride   Intravenous Once  . amLODipine  5 mg Oral Daily  . atorvastatin  80 mg Oral q1800  . carvedilol  3.125 mg Oral BID WC  . feeding supplement (GLUCERNA SHAKE)  237 mL Oral TID BM  .  gabapentin  600 mg Oral QHS  . insulin aspart  0-5 Units Subcutaneous QHS  . insulin aspart  0-9 Units Subcutaneous TID WC  . insulin glargine  10 Units Subcutaneous QHS  . pantoprazole  40 mg Oral Daily   Continuous Infusions:    Troy Quesenberry, DO  [facility] Pager [phone]  If 7PM-7AM, please contact night-coverage [email or web address] Password [email or web address] 04/05/2015, 4:23 PM   LOS: 2 days

## 2015-04-05 NOTE — Consult Note (Signed)
Admission H&P    Chief Complaint: Acute stroke on MRI study.  HPI: [name] is an 70 y.o. male with a history of iron deficiency anemia, chronic kidney disease, review stroke, coronary artery disease, diabetes mellitus, hypertension and hyperlipidemia, admitted on 04/03/2015 for acute gastrointestinal hemorrhage. An MRI of his brain was obtained because of complaints of weakness and visual changes, which showed a small left area ventricular parietal ischemic infarction. He has a history of stroke one year ago. He's been taking aspirin daily. He has also been on a statin medication daily. NIH stroke score at the time of this evaluation was 0. It's unclear when patient's stroke occurred.  LSN: Unclear tPA Given: No: No deficits and unclear when infarction occurred. mRankin:  Past Medical History  Diagnosis Date  . Hypertension   . High cholesterol   . GERD (gastroesophageal reflux disease)   . GI bleed     a. Recurrent GI bleed, tx with IV iron. b. Per heme notes - likely AVMs 04/2012 (tx with cauterization several months ago).  . Orthostatic hypotension     a. Tx with florinef.  . Hematuria     a. Urology note scan from 07/2012: cystoscopy without evidence for bladder lesion, only lateral hypertrophy of posterior urethra, bladder impression from BPH. b. Pt states he had "some tests" scheduled for later in July 2014.  . Stage III chronic kidney disease     a. Stage 3 (DM with complications ->CKD, peripheral neuropathy).  . Anemia, iron deficiency 03/24/2011    a. Recurrent GI bleed, tx with periodic iron infusions.  . 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 Sleep apnea     "had mask; couldn't sleep in it" (09/20/2012)  . Type II diabetes mellitus ([place])     a. Dx 1994, uncontrolled.   . Diabetic peripheral neuropathy ([place]) 2014    foot pain.  . Chronic lower back pain   . Fatty liver     on CT of 11/2010  . AVM (arteriovenous malformation) of duodenum, acquired     egds in 01/2012, 04/2011   . Polyp, colonic     Colonoscopy 01/2012 "benign" polyp  . CAD (coronary artery disease)     a. Cath 09/2012: moderate borderline CAD in mid LAD/small diagonal branch, mild RCA stenosis, to be managed medically   . BPH (benign prostatic hyperplasia)   . Prostate cancer ([place])   . Stroke ([place])   . Peripheral neuropathy ([place])   . 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 Shoulder open rotator cuff repair Left 1980's  . Inguinal hernia repair Right 2011  . Lumbar disc surgery  1980's  . Robot assisted laparoscopic radical prostatectomy N/A 01/19/2013    Procedure: ROBOTIC ASSISTED LAPAROSCOPIC RADICAL PROSTATECTOMY;  Surgeon: Bernestine Amass, MD;  Location: [facility];  Service: Urology;  Laterality: N/A;  . Lymphadenectomy Bilateral 01/19/2013    Procedure: LYMPHADENECTOMY;  Surgeon: Bernestine Amass, MD;  Location: [facility];  Service: Urology;  Laterality: Bilateral;  . Enteroscopy N/A 03/01/2013    Procedure: ENTEROSCOPY;  Surgeon: Inda Castle, MD;  Location: [place];  Service: Endoscopy;  Laterality: N/A;  . Left heart catheterization with coronary angiogram N/A 09/21/2012    Procedure: LEFT HEART CATHETERIZATION WITH CORONARY ANGIOGRAM;  Surgeon: Peter M Martinique, MD;  Location: [facility] CATH LAB;  Service: Cardiovascular;  Laterality: N/A;    Family History  Problem Relation Age of Onset  .  Heart attack Brother     Died at 45  . Stroke Father     Died at 35  . Diabetes Sister   . Diabetes Mother   . Stomach cancer Brother   . Heart attack Sister    Social History:  reports that he has been smoking Cigarettes.  He started smoking about 47 years ago. He has a 43 pack-year smoking history. He has never used smokeless tobacco. He reports that he does not drink alcohol or use illicit drugs.  Allergies: No Known Allergies  Medications Prior to Admission  Medication Sig Dispense Refill  . amLODipine (NORVASC) 5 MG tablet Take  5 mg by mouth daily.  6  . aspirin EC 81 MG tablet Take 81 mg by mouth daily.    [name] Kitchen atorvastatin (LIPITOR) 80 MG tablet Take 1 tablet (80 mg total) by mouth daily at 6 PM. (Patient taking differently: Take 80 mg by mouth daily. ) 30 tablet 5  . diltiazem (DILACOR XR) 120 MG 24 hr capsule Take 120 mg by mouth daily.  6  . fluticasone (FLONASE) 50 MCG/ACT nasal spray Place 2 sprays into both nostrils daily as needed (congestion).   11  . gabapentin (NEURONTIN) 300 MG capsule TAKE 1 CAPSULE (300 MG TOTAL) BY MOUTH AT BEDTIME. (Patient taking differently: Take 600 mg by mouth 3 (three) times daily. ) 30 capsule 0  . insulin glargine (LANTUS) 100 unit/mL SOPN Inject 25 Units into the skin at bedtime.    . insulin lispro (HUMALOG) 100 UNIT/ML injection USE AS ADVISED - MAXIMUM 25 UNITS PER DAY. INJECT 7-7-5 UNITS PLUS SSI AS ADVISED. (Patient taking differently: Inject 8 Units into the skin 3 (three) times daily as needed for high blood sugar (CBG >260). ) 10 mL 0  . lisinopril (PRINIVIL,ZESTRIL) 20 MG tablet Take 20 mg by mouth daily.  5  . Multiple Vitamin (MULTIVITAMIN WITH MINERALS) TABS tablet Take 1 tablet by mouth daily.    . nitroGLYCERIN (NITROSTAT) 0.4 MG SL tablet Place 1 tablet (0.4 mg total) under the tongue every 5 (five) minutes as needed for chest pain (up to 3 doses). 25 tablet 2  . omeprazole (PRILOSEC) 40 MG capsule Take 1 capsule (40 mg total) by mouth daily. 30 capsule 5  . polyvinyl alcohol (LIQUIFILM TEARS) 1.4 % ophthalmic solution Place 1 drop into both eyes daily as needed (dry eyes).    . potassium chloride SA (KLOR-CON M20) 20 MEQ tablet Take 2 tablets (40 mEq total) by mouth once. Take 2 pills a day (Patient taking differently: Take 20 mEq by mouth 2 (two) times daily. ) 60 tablet 6  . Vitamin D, Ergocalciferol, (DRISDOL) 50000 UNITS CAPS capsule Take 50,000 Units by mouth every 7 (seven) days. On Mondays    . hydrALAZINE (APRESOLINE) 10 MG tablet TAKE 1 TABLET BY MOUTH EVERY 8  HOURS (Patient not taking: Reported on 04/03/2015) 90 tablet 2  . Insulin Pen Needle (B-D ULTRAFINE III SHORT PEN) 31G X 8 MM MISC USE AS DIRECTED WITH INSULIN Dx: 250.02 100 each 5  . pantoprazole (PROTONIX) 40 MG tablet Take 1 tablet (40 mg total) by mouth daily. (Patient not taking: Reported on 04/03/2015) 30 tablet 3  . [DISCONTINUED] amLODipine (NORVASC) 10 MG tablet Take 1 tablet (10 mg total) by mouth daily. (Patient not taking: Reported on 04/03/2015) 30 tablet 5  . [DISCONTINUED] Insulin Detemir (LEVEMIR) 100 UNIT/ML Pen Inject 10 Units into the skin daily at 10 pm. (Patient not taking: Reported on  04/03/2015) 15 mL 5  . [DISCONTINUED] NEXIUM 40 MG capsule TAKE 1 CAPSULE (40 MG TOTAL) BY MOUTH DAILY AT 12 NOON. (Patient not taking: Reported on 04/03/2015) 30 capsule 8    ROS: History obtained from the patient  General ROS: negative for - chills, fatigue, fever, night sweats, weight gain or weight loss Psychological ROS: negative for - behavioral disorder, hallucinations, memory difficulties, mood swings or suicidal ideation Ophthalmic ROS: negative for - blurry vision, double vision, eye pain or loss of vision ENT ROS: negative for - epistaxis, nasal discharge, oral lesions, sore throat, tinnitus or vertigo Allergy and Immunology ROS: negative for - hives or itchy/watery eyes Hematological and Lymphatic ROS: negative for - bleeding problems, bruising or swollen lymph nodes Endocrine ROS: negative for - galactorrhea, hair pattern changes, polydipsia/polyuria or temperature intolerance Respiratory ROS: negative for - cough, hemoptysis, shortness of breath or wheezing Cardiovascular ROS: negative for - chest pain, dyspnea on exertion, edema or irregular heartbeat Gastrointestinal ROS: As noted in present illness with GI hemorrhage Genito-Urinary ROS: negative for - dysuria, hematuria, incontinence or urinary frequency/urgency Musculoskeletal ROS: negative for - joint swelling or muscular  weakness Neurological ROS: as noted in HPI Dermatological ROS: negative for rash and skin lesion changes  Physical Examination: Blood pressure 163/92, pulse 83, temperature 98.8 F (37.1 C), temperature source Oral, resp. rate 18, height _0  (1.702 m), weight 65.318 kg (144 lb), SpO2 98 %.  HEENT-  Normocephalic, no lesions, without obvious abnormality.  Normal external eye and conjunctiva.  Normal TM's bilaterally.  Normal auditory canals and external ears. Normal external nose, mucus membranes and septum.  Normal pharynx. Neck supple with no masses, nodes, nodules or enlargement. Cardiovascular - regular rate and rhythm, S1, S2 normal, no murmur, click, rub or gallop Lungs - chest clear, no wheezing, rales, normal symmetric air entry Abdomen - soft, non-tender; bowel sounds normal; no masses,  no organomegaly Extremities - no joint deformities, effusion, or inflammation  Neurologic Examination: Mental Status: Alert, oriented, thought content appropriate.  Speech fluent without evidence of aphasia. Able to follow commands without difficulty. Cranial Nerves: II-Visual fields were normal. III/IV/VI-Pupils were equal and reacted normally to light. Extraocular movements were full and conjugate.    V/VII-no facial numbness and no facial weakness. VIII-normal. X-normal speech and symmetrical palatal movement. XI: trapezius strength/neck flexion strength normal bilaterally XII-midline tongue extension with normal strength. Motor: 5/5 bilaterally with normal tone and bulk Sensory: Normal throughout except for absent vibratory sensation in the feet. Deep Tendon Reflexes: Trace to 1+ and symmetric. Plantars: Flexor bilaterally Cerebellar: Normal finger-to-nose testing. Carotid auscultation: Normal  Results for orders placed or performed during the hospital encounter of 04/03/15 (from the past 48 hour(s))  Prepare RBC     Status: None   Collection Time: 04/03/15  9:10 PM  Result Value Ref  Range   Order Confirmation ORDER PROCESSED BY BLOOD BANK   Glucose, capillary     Status: Abnormal   Collection Time: 04/03/15  9:38 PM  Result Value Ref Range   Glucose-Capillary 315 (H) 65 - 99 mg/dL  Creatinine, urine, random     Status: None   Collection Time: 04/03/15 10:15 PM  Result Value Ref Range   Creatinine, Urine 134.70 mg/dL  Sodium, urine, random     Status: None   Collection Time: 04/03/15 10:15 PM  Result Value Ref Range   Sodium, Ur 70 mmol/L  Type and screen [place]     Status: None  Collection Time: 04/03/15 10:17 PM  Result Value Ref Range   ABO/RH(D) AB POS    Antibody Screen NEG    Sample Expiration 04/06/2015    Unit Number [id number]    Blood Component Type RED CELLS,LR    Unit division 00    Status of Unit ISSUED,FINAL    Transfusion Status OK TO TRANSFUSE    Crossmatch Result Compatible    Unit Number [id number]    Blood Component Type RED CELLS,LR    Unit division 00    Status of Unit ISSUED,FINAL    Transfusion Status OK TO TRANSFUSE    Crossmatch Result Compatible    Unit Number [id number]    Blood Component Type RED CELLS,LR    Unit division 00    Status of Unit ISSUED,FINAL    Transfusion Status OK TO TRANSFUSE    Crossmatch Result Compatible   Basic metabolic panel     Status: Abnormal   Collection Time: 04/04/15  5:51 AM  Result Value Ref Range   Sodium 140 135 - 145 mmol/L   Potassium 4.7 3.5 - 5.1 mmol/L   Chloride 114 (H) 101 - 111 mmol/L   CO2 21 (L) 22 - 32 mmol/L   Glucose, Bld 79 65 - 99 mg/dL   BUN 37 (H) 6 - 20 mg/dL   Creatinine, Ser 3.52 (H) 0.61 - 1.24 mg/dL   Calcium 8.1 (L) 8.9 - 10.3 mg/dL   GFR calc non Af Amer 16 (L) >60 mL/min   GFR calc Af Amer 19 (L) >60 mL/min    Comment: (NOTE) The eGFR has been calculated using the CKD EPI equation. This calculation has not been validated in all clinical situations. eGFR's persistently <60 mL/min signify possible Chronic Kidney Disease.     Anion gap 5 5 - 15  Hemoglobin and hematocrit, blood     Status: Abnormal   Collection Time: 04/04/15  5:51 AM  Result Value Ref Range   Hemoglobin 6.6 (LL) 13.0 - 17.0 g/dL    Comment: REPEATED TO VERIFY CRITICAL RESULT CALLED TO, READ BACK BY AND VERIFIED WITH: GRENAWALT,R RN [id number] 1.18.17 MCADOO,G    HCT 20.2 (L) 39.0 - 52.0 %  Prepare RBC     Status: None   Collection Time: 04/04/15  7:42 AM  Result Value Ref Range   Order Confirmation ORDER PROCESSED BY BLOOD BANK   Glucose, capillary     Status: Abnormal   Collection Time: 04/04/15  7:42 AM  Result Value Ref Range   Glucose-Capillary 58 (L) 65 - 99 mg/dL  Glucose, capillary     Status: Abnormal   Collection Time: 04/04/15  8:10 AM  Result Value Ref Range   Glucose-Capillary 104 (H) 65 - 99 mg/dL  Glucose, capillary     Status: None   Collection Time: 04/04/15 11:28 AM  Result Value Ref Range   Glucose-Capillary 92 65 - 99 mg/dL  Iron and TIBC     Status: Abnormal   Collection Time: 04/04/15  3:37 PM  Result Value Ref Range   Iron 250 (H) 45 - 182 ug/dL   TIBC 286 250 - 450 ug/dL   Saturation Ratios 88 (H) 17.9 - 39.5 %   UIBC 36 ug/dL  Ferritin     Status: Abnormal   Collection Time: 04/04/15  3:37 PM  Result Value Ref Range   Ferritin 19 (L) 24 - 336 ng/mL  Glucose, capillary     Status: Abnormal   Collection Time: 04/04/15  5:05 PM  Result Value Ref Range   Glucose-Capillary 137 (H) 65 - 99 mg/dL   Comment 1 Notify RN    Comment 2 Document in Chart   Glucose, capillary     Status: Abnormal   Collection Time: 04/04/15  9:07 PM  Result Value Ref Range   Glucose-Capillary 63 (L) 65 - 99 mg/dL  Glucose, capillary     Status: None   Collection Time: 04/04/15  9:41 PM  Result Value Ref Range   Glucose-Capillary 66 65 - 99 mg/dL  Glucose, capillary     Status: None   Collection Time: 04/04/15 10:22 PM  Result Value Ref Range   Glucose-Capillary 91 65 - 99 mg/dL  Glucose, capillary     Status: Abnormal    Collection Time: 04/05/15  5:12 AM  Result Value Ref Range   Glucose-Capillary 60 (L) 65 - 99 mg/dL  Glucose, capillary     Status: Abnormal   Collection Time: 04/05/15  5:50 AM  Result Value Ref Range   Glucose-Capillary 179 (H) 65 - 99 mg/dL  CBC     Status: Abnormal   Collection Time: 04/05/15  5:57 AM  Result Value Ref Range   WBC 6.5 4.0 - 10.5 K/uL   RBC 3.21 (L) 4.22 - 5.81 MIL/uL   Hemoglobin 9.0 (L) 13.0 - 17.0 g/dL    Comment: POST TRANSFUSION SPECIMEN   HCT 26.9 (L) 39.0 - 52.0 %   MCV 83.8 78.0 - 100.0 fL   MCH 28.0 26.0 - 34.0 pg   MCHC 33.5 30.0 - 36.0 g/dL   RDW 15.0 11.5 - 15.5 %   Platelets 309 150 - 400 K/uL  TSH     Status: None   Collection Time: 04/05/15  5:57 AM  Result Value Ref Range   TSH 1.235 0.350 - 4.500 uIU/mL  Glucose, capillary     Status: Abnormal   Collection Time: 04/05/15  7:34 AM  Result Value Ref Range   Glucose-Capillary 103 (H) 65 - 99 mg/dL  Glucose, capillary     Status: None   Collection Time: 04/05/15 11:41 AM  Result Value Ref Range   Glucose-Capillary 77 65 - 99 mg/dL  Glucose, capillary     Status: Abnormal   Collection Time: 04/05/15  4:06 PM  Result Value Ref Range   Glucose-Capillary 160 (H) 65 - 99 mg/dL   Comment 1 Notify RN    Comment 2 Document in Chart    [name] Contrast  04/04/2015  CLINICAL DATA:  Dizziness with new visual disturbance. EXAM: MRI HEAD WITHOUT CONTRAST TECHNIQUE: Multiplanar, multiecho pulse sequences of the brain and surrounding structures were obtained without intravenous contrast. COMPARISON:  03/23/2013 CT Head. FINDINGS: There is a small area of restricted diffusion in the LEFT parietal periventricular white matter, 5 mm in diameter, consistent with acute infarction. This lies within the LEFT MCA territory. No other areas of restricted diffusion are seen. No acute hemorrhage, mass lesion, or extra-axial fluid. Hydrocephalus ex vacuo. Extensive white matter disease affects the periventricular and  subcortical regions, also affecting the brainstem. Scattered areas of lacunar infarction are noted, widespread. Numerous foci of chronic hemorrhage are seen throughout the white matter, and deep nuclei, brainstem, and cerebellum. Largest area involves the LEFT basal ganglia specifically LEFT caudate greater than lentiform nucleus. These hemosiderin deposits are consistent with chronic hypertensive cerebrovascular disease. Cerebral amyloid angiopathy less favored. No indication that the acute abnormality is hemorrhagic. Unremarkable pituitary and cerebellar tonsils. Cervical spondylosis with incompletely visualized disc  herniations at C3-4 and C4-5 resulting in mild cord flattening. Flow voids are maintained. No superficial siderosis or intravascular effect of Feraheme. Extracranial soft tissues unremarkable. IMPRESSION: Acute LEFT parietal periventricular deep white matter infarct, nonhemorrhagic. Widespread foci of microhemorrhage throughout the brain, likely sequelae of hypertensive cerebrovascular disease. Advanced atrophy with extensive small vessel disease along with scattered areas of lacunar infarction. Cervical spondylosis. Electronically Signed   By: Staci Righter M.D.   On: 04/04/2015 17:14    Assessment: 70 y.o. male multiple risk factors for stroke presenting with acute intestinal hemorrhage and MRI study showing a small acute left periventricular parietal ischemic infarction.  Stroke Risk Factors - diabetes mellitus, hyperlipidemia and hypertension  Plan: 1. HgbA1c, fasting lipid panel 2. MRA  of the brain without contrast 3. Echocardiogram 4. Carotid dopplers 5. Prophylactic therapy-Antiplatelet med: Aspirin  6. Risk factor modification 7. Telemetry monitoring  C.R. Nicole Kindred, MD [facility] [phone]  04/05/2015, 7:51 PM

## 2015-04-05 NOTE — Evaluation (Signed)
Physical Therapy Evaluation Patient Details Name: [name] MRN: [id number] DOB: 05/12/1945 Today's Date: 04/05/2015   History of Present Illness  Patient is a 70 yo male admitted 04/03/15 with symptomatic anemia for capsule endoscopy (04/05/15).  Morning of 04/04/15, patient had dizziness and blurry vision.  MRI showed acute Lt parietal periventricular infarct with widespread microhemorrhages.    PMH:  DM, CVA, IDA, ischemic cardiomyopathy, peripheral neuropathy, hearing loss, chronic back/RLE pain.  Clinical Impression  Patient is functioning at independent/Mod I level with all mobility and gait.  Good balance with high level balance activities.  No dizziness reported during evaluation/mobility.   Patient is at his baseline functional level.  No acute PT needs identified - PT will sign off.    Follow Up Recommendations No PT follow up;Supervision - Intermittent    Equipment Recommendations  None recommended by PT    Recommendations for Other Services       Precautions / Restrictions Precautions Precautions: None Restrictions Weight Bearing Restrictions: No      Mobility  Bed Mobility Overal bed mobility: Independent                Transfers Overall transfer level: Independent Equipment used: None                Ambulation/Gait Ambulation/Gait assistance: Independent Ambulation Distance (Feet): 200 Feet Assistive device: None Gait Pattern/deviations: Step-through pattern;Antalgic   Gait velocity interpretation: at or above normal speed for age/gender General Gait Details: Noted slight antalgic gait related to RLE pain.  Otherwise no deficits noted.  No dizziness reported during gait.  Stairs            Wheelchair Mobility    Modified Rankin (Stroke Patients Only) Modified Rankin (Stroke Patients Only) Pre-Morbid Rankin Score: No significant disability Modified Rankin: No significant disability     Balance Overall balance assessment: Modified  Independent               Single Leg Stance - Right Leg: 1 (Baseline) Single Leg Stance - Left Leg: 1 (Baseline)     Rhomberg - Eyes Opened: 30 [place]berg - Eyes Closed: 25 (Increased sway) High level balance activites: Turns;Sudden stops;Head turns;Direction changes High Level Balance Comments: No loss of balance during high level balance activities.             Pertinent Vitals/Pain Pain Assessment: 0-10 Pain Score: 3  Pain Location: RLE (per patient "from back"  "I get shots") Pain Descriptors / Indicators: Aching Pain Intervention(s): Monitored during session;Repositioned    Home Living Family/patient expects to be discharged to:: Private residence Living Arrangements: Spouse/significant other Available Help at Discharge: Family;Available 24 hours/day Type of Home: House                Prior Function Level of Independence: Independent               Hand Dominance   Dominant Hand: Right    Extremity/Trunk Assessment   Upper Extremity Assessment: Overall WFL for tasks assessed           Lower Extremity Assessment: Overall WFL for tasks assessed (h/o bil LE peripheral neuropathy)         Communication   Communication: No difficulties  Cognition Arousal/Alertness: Awake/alert Behavior During Therapy: WFL for tasks assessed/performed Overall Cognitive Status: Within Functional Limits for tasks assessed                      General Comments  Exercises        Assessment/Plan    PT Assessment Patent does not need any further PT services  PT Diagnosis Abnormality of gait   PT Problem List    PT Treatment Interventions     PT Goals (Current goals can be found in the Care Plan section) Acute Rehab PT Goals PT Goal Formulation: All assessment and education complete, DC therapy    Frequency     Barriers to discharge        Co-evaluation               End of Session   Activity Tolerance: Patient tolerated  treatment well Patient left: in bed;with call [place]/phone within reach;with family/visitor present (sitting EOB) Nurse Communication: Mobility status (No PT needs; Requesting meal tray)    Functional Assessment Tool Used: Clinical judgement Functional Limitation: Mobility: Walking and moving around Mobility: Walking and Moving Around Current Status [phone]): 0 percent impaired, limited or restricted Mobility: Walking and Moving Around Goal Status [phone]): 0 percent impaired, limited or restricted Mobility: Walking and Moving Around Discharge Status [phone]): 0 percent impaired, limited or restricted    Time: [id number] PT Time Calculation (min) (ACUTE ONLY): 16 min   Charges:   PT Evaluation $PT Eval Low Complexity: 1 Procedure     PT G Codes:   PT G-Codes **NOT FOR INPATIENT CLASS** Functional Assessment Tool Used: Clinical judgement Functional Limitation: Mobility: Walking and moving around Mobility: Walking and Moving Around Current Status [id number]): 0 percent impaired, limited or restricted Mobility: Walking and Moving Around Goal Status [id number]): 0 percent impaired, limited or restricted Mobility: Walking and Moving Around Discharge Status [id number]): 0 percent impaired, limited or restricted    Despina Pole 04/05/2015, 7:26 PM Carita Pian. Sanjuana Kava, [place] Pager [phone]

## 2015-04-05 NOTE — Progress Notes (Signed)
Patient ID: [name], male   DOB: 27-May-1945, 70 y.o.   MRN: [id number]    Progress Note   Subjective  Doing well-no complaints- feels better since transfusions Waiting for capsule endoscopy   Objective   Vital signs in last 24 hours: Temp:  [97.9 F (36.6 C)-98.7 F (37.1 C)] 98.6 F (37 C) (01/19 0500) Pulse Rate:  [87-92] 91 (01/19 0500) Resp:  [16] 16 (01/19 0500) BP: (156-176)/(77-92) 170/87 mmHg (01/19 0500) SpO2:  [95 %-100 %] 97 % (01/19 0500) Weight:  [144 lb 10 oz (65.6 kg)] 144 lb 10 oz (65.6 kg) (01/18 2100) Last BM Date: 04/05/15 General:    AA male n NAD Heart:  Regular rate and rhythm; no murmurs Lungs: Respirations even and unlabored, lungs CTA bilaterally Abdomen:  Soft, nontender and nondistended. Normal bowel sounds. Extremities:  Without edema. Neurologic:  Alert and oriented,  grossly normal neurologically. Psych:  Cooperative. Normal mood and affect.  Lab Results:  Recent Labs  04/03/15 1040 04/04/15 0551 04/05/15 0557  WBC 5.9  --  6.5  HGB 6.3* 6.6* 9.0*  HCT 20.0* 20.2* 26.9*  PLT 435*  --  309   BMET  Recent Labs  04/03/15 1040 04/04/15 0551  NA 140 140  K 5.1 4.7  CL 112* 114*  CO2 20* 21*  GLUCOSE 134* 79  BUN 41* 37*  CREATININE 3.76* 3.52*  CALCIUM 8.6* 8.1*   LFT  Recent Labs  04/03/15 1040  PROT 6.4*  ALBUMIN 2.6*  AST 13*  ALT 12*  ALKPHOS 120  BILITOT 0.3       Assessment / Plan:    #1 70 yo male with chronic Fe deficiency anemia,multifactorial - with hx AVM's and CKD Recent drop in HGB and intermittent dark stools at home though heme negative here  Feels better after transfusions  Capsule endoscopy today,interpret tomorrow then decide if EGD/Enteroscopy indicated. Discussed with pt and he is agreeable with plan      LOS: 2 days   Amy Esterwood  04/05/2015, 9:09 AM  Agree with Ms. Genia Harold assessment and plan. Gatha Mayer, MD, [facility]

## 2015-04-05 NOTE — Progress Notes (Signed)
Hypoglycemic Event  CBG: 60  Treatment: D50 IV 50 mL  Symptoms: None  Follow-up CBG: Time:0550 CBG Result:179  Possible Reasons for Event: Other:NPO  Comments/MD notified: KIRBY    Jakobee Brackins I

## 2015-04-05 NOTE — Progress Notes (Signed)
Capsule Endoscopy in progress. Pt swallowed  Pill cam without problem at 0950. Teaching done for pt to stay NPO for two hours and then follow schedule given for  Liquids and food.  The 8 hours will be completed around 6pm. Pt verbalized understanding and gave report to Charge nurse on the floor.

## 2015-04-05 NOTE — Progress Notes (Signed)
Per Joycelyn Schmid with Endo.  Hold morning medication.  Patient is strict NPO for capsul study.

## 2015-04-06 ENCOUNTER — Observation Stay (HOSPITAL_COMMUNITY): Payer: Commercial Managed Care - HMO

## 2015-04-06 ENCOUNTER — Inpatient Hospital Stay (HOSPITAL_COMMUNITY): Payer: Commercial Managed Care - HMO

## 2015-04-06 ENCOUNTER — Encounter (HOSPITAL_COMMUNITY): Payer: Self-pay | Admitting: Internal Medicine

## 2015-04-06 DIAGNOSIS — I639 Cerebral infarction, unspecified: Secondary | ICD-10-CM | POA: Diagnosis present

## 2015-04-06 DIAGNOSIS — K31819 Angiodysplasia of stomach and duodenum without bleeding: Secondary | ICD-10-CM | POA: Diagnosis present

## 2015-04-06 DIAGNOSIS — N184 Chronic kidney disease, stage 4 (severe): Secondary | ICD-10-CM | POA: Diagnosis present

## 2015-04-06 DIAGNOSIS — E875 Hyperkalemia: Secondary | ICD-10-CM | POA: Diagnosis present

## 2015-04-06 DIAGNOSIS — E785 Hyperlipidemia, unspecified: Secondary | ICD-10-CM | POA: Diagnosis present

## 2015-04-06 DIAGNOSIS — M545 Low back pain: Secondary | ICD-10-CM | POA: Diagnosis present

## 2015-04-06 DIAGNOSIS — D631 Anemia in chronic kidney disease: Secondary | ICD-10-CM | POA: Diagnosis present

## 2015-04-06 DIAGNOSIS — I2510 Atherosclerotic heart disease of native coronary artery without angina pectoris: Secondary | ICD-10-CM | POA: Diagnosis present

## 2015-04-06 DIAGNOSIS — D649 Anemia, unspecified: Secondary | ICD-10-CM | POA: Diagnosis not present

## 2015-04-06 DIAGNOSIS — Z794 Long term (current) use of insulin: Secondary | ICD-10-CM | POA: Diagnosis not present

## 2015-04-06 DIAGNOSIS — K5520 Angiodysplasia of colon without hemorrhage: Secondary | ICD-10-CM

## 2015-04-06 DIAGNOSIS — E872 Acidosis: Secondary | ICD-10-CM | POA: Diagnosis present

## 2015-04-06 DIAGNOSIS — Z6823 Body mass index (BMI) 23.0-23.9, adult: Secondary | ICD-10-CM | POA: Diagnosis not present

## 2015-04-06 DIAGNOSIS — Z8546 Personal history of malignant neoplasm of prostate: Secondary | ICD-10-CM | POA: Diagnosis not present

## 2015-04-06 DIAGNOSIS — K5521 Angiodysplasia of colon with hemorrhage: Secondary | ICD-10-CM | POA: Diagnosis present

## 2015-04-06 DIAGNOSIS — E1122 Type 2 diabetes mellitus with diabetic chronic kidney disease: Secondary | ICD-10-CM | POA: Diagnosis present

## 2015-04-06 DIAGNOSIS — K219 Gastro-esophageal reflux disease without esophagitis: Secondary | ICD-10-CM | POA: Diagnosis present

## 2015-04-06 DIAGNOSIS — I255 Ischemic cardiomyopathy: Secondary | ICD-10-CM | POA: Diagnosis present

## 2015-04-06 DIAGNOSIS — R29700 NIHSS score 0: Secondary | ICD-10-CM | POA: Diagnosis not present

## 2015-04-06 DIAGNOSIS — I129 Hypertensive chronic kidney disease with stage 1 through stage 4 chronic kidney disease, or unspecified chronic kidney disease: Secondary | ICD-10-CM | POA: Diagnosis present

## 2015-04-06 DIAGNOSIS — D509 Iron deficiency anemia, unspecified: Secondary | ICD-10-CM | POA: Diagnosis not present

## 2015-04-06 DIAGNOSIS — D500 Iron deficiency anemia secondary to blood loss (chronic): Secondary | ICD-10-CM | POA: Diagnosis present

## 2015-04-06 DIAGNOSIS — E1165 Type 2 diabetes mellitus with hyperglycemia: Secondary | ICD-10-CM | POA: Diagnosis present

## 2015-04-06 DIAGNOSIS — H539 Unspecified visual disturbance: Secondary | ICD-10-CM | POA: Diagnosis present

## 2015-04-06 DIAGNOSIS — N179 Acute kidney failure, unspecified: Secondary | ICD-10-CM | POA: Diagnosis present

## 2015-04-06 DIAGNOSIS — G8929 Other chronic pain: Secondary | ICD-10-CM | POA: Diagnosis present

## 2015-04-06 DIAGNOSIS — F17210 Nicotine dependence, cigarettes, uncomplicated: Secondary | ICD-10-CM | POA: Diagnosis present

## 2015-04-06 DIAGNOSIS — E440 Moderate protein-calorie malnutrition: Secondary | ICD-10-CM | POA: Diagnosis present

## 2015-04-06 DIAGNOSIS — G4733 Obstructive sleep apnea (adult) (pediatric): Secondary | ICD-10-CM | POA: Diagnosis present

## 2015-04-06 DIAGNOSIS — E1140 Type 2 diabetes mellitus with diabetic neuropathy, unspecified: Secondary | ICD-10-CM | POA: Diagnosis present

## 2015-04-06 DIAGNOSIS — Z7982 Long term (current) use of aspirin: Secondary | ICD-10-CM | POA: Diagnosis not present

## 2015-04-06 DIAGNOSIS — E1142 Type 2 diabetes mellitus with diabetic polyneuropathy: Secondary | ICD-10-CM | POA: Diagnosis present

## 2015-04-06 HISTORY — DX: Angiodysplasia of colon without hemorrhage: K55.20

## 2015-04-06 LAB — CBC
HEMATOCRIT: 28 % — AB (ref 39.0–52.0)
Hemoglobin: 9.3 g/dL — ABNORMAL LOW (ref 13.0–17.0)
MCH: 27.8 pg (ref 26.0–34.0)
MCHC: 33.2 g/dL (ref 30.0–36.0)
MCV: 83.6 fL (ref 78.0–100.0)
Platelets: 337 10*3/uL (ref 150–400)
RBC: 3.35 MIL/uL — AB (ref 4.22–5.81)
RDW: 14.7 % (ref 11.5–15.5)
WBC: 5.8 10*3/uL (ref 4.0–10.5)

## 2015-04-06 LAB — BASIC METABOLIC PANEL
Anion gap: 5 (ref 5–15)
BUN: 28 mg/dL — AB (ref 6–20)
CALCIUM: 8.1 mg/dL — AB (ref 8.9–10.3)
CO2: 21 mmol/L — ABNORMAL LOW (ref 22–32)
CREATININE: 3.29 mg/dL — AB (ref 0.61–1.24)
Chloride: 112 mmol/L — ABNORMAL HIGH (ref 101–111)
GFR calc Af Amer: 21 mL/min — ABNORMAL LOW (ref >60)
GFR, EST NON AFRICAN AMERICAN: 18 mL/min — AB (ref >60)
Glucose, Bld: 127 mg/dL — ABNORMAL HIGH (ref 65–99)
Potassium: 4.3 mmol/L (ref 3.5–5.1)
SODIUM: 138 mmol/L (ref 135–145)

## 2015-04-06 LAB — GLUCOSE, CAPILLARY
GLUCOSE-CAPILLARY: 72 mg/dL (ref 65–99)
GLUCOSE-CAPILLARY: 116 mg/dL — AB (ref 65–99)
Glucose-Capillary: 113 mg/dL — ABNORMAL HIGH (ref 65–99)
Glucose-Capillary: 211 mg/dL — ABNORMAL HIGH (ref 65–99)

## 2015-04-06 LAB — LIPID PANEL
CHOL/HDL RATIO: 7.2 ratio
Cholesterol: 201 mg/dL — ABNORMAL HIGH (ref 0–200)
HDL: 28 mg/dL — AB (ref >40)
LDL CALC: 132 mg/dL — AB (ref 0–99)
Triglycerides: 203 mg/dL — ABNORMAL HIGH (ref <150)
VLDL: 41 mg/dL — AB (ref 0–40)

## 2015-04-06 MED ORDER — CARVEDILOL 6.25 MG PO TABS
6.2500 mg | ORAL_TABLET | Freq: Two times a day (BID) | ORAL | Status: DC
  Administered 2015-04-06 – 2015-04-07 (×2): 6.25 mg via ORAL
  Filled 2015-04-06 (×2): qty 1

## 2015-04-06 MED ORDER — ASPIRIN 325 MG PO TABS
325.0000 mg | ORAL_TABLET | Freq: Every day | ORAL | Status: DC
  Administered 2015-04-06 – 2015-04-07 (×2): 325 mg via ORAL
  Filled 2015-04-06 (×2): qty 1

## 2015-04-06 NOTE — Progress Notes (Signed)
STROKE TEAM PROGRESS NOTE   HISTORY OF PRESENT ILLNESS [name] is an 70 y.o. male with a history of iron deficiency anemia, chronic kidney disease, previous stroke, coronary artery disease, diabetes mellitus, hypertension and hyperlipidemia, admitted on 04/03/2015 for acute gastrointestinal hemorrhage. An MRI of his brain was obtained because of complaints of weakness and visual changes, which showed a small left area ventricular parietal ischemic infarction. He has a history of stroke one year ago. He's been taking aspirin daily. He has also been on a statin medication daily. NIH stroke score at the time of this evaluation was 0. It's unclear when patient's stroke occurred.  LSN: Unclear tPA Given: No: No deficits and unclear when infarction occurred. mRankin:   SUBJECTIVE (INTERVAL HISTORY) No family members present. The patient states that he had no visual problems at time admission. He just felt weak all over. He reports that he had a stroke approximately one year ago also associated with a GI bleed. Dr. Leonie Man explained that the patient probably had a silent stroke and will need to minimize his risk factors.   OBJECTIVE Temp:  [98.1 F (36.7 C)-98.8 F (37.1 C)] 98.1 F (36.7 C) (01/20 0509) Pulse Rate:  [80-83] 80 (01/20 0509) Cardiac Rhythm:  [-]  Resp:  [18] 18 (01/20 0509) BP: (150-163)/(79-92) 150/79 mmHg (01/20 0509) SpO2:  [98 %-100 %] 100 % (01/20 0509) Weight:  [65.318 kg (144 lb)] 65.318 kg (144 lb) (01/19 0938)  CBC:  Recent Labs Lab 04/03/15 1040  04/05/15 0557 04/06/15 0535  WBC 5.9  --  6.5 5.8  NEUTROABS 4.0  --   --   --   HGB 6.3*  < > 9.0* 9.3*  HCT 20.0*  < > 26.9* 28.0*  MCV 87.3  --  83.8 83.6  PLT 435*  --  309 337  < > = values in this interval not displayed.  Basic Metabolic Panel:  Recent Labs Lab 04/04/15 0551 04/06/15 0535  NA 140 138  K 4.7 4.3  CL 114* 112*  CO2 21* 21*  GLUCOSE 79 127*  BUN 37* 28*  CREATININE 3.52* 3.29*   CALCIUM 8.1* 8.1*    Lipid Panel:    Component Value Date/Time   CHOL 201* 04/06/2015 0535   TRIG 203* 04/06/2015 0535   HDL 28* 04/06/2015 0535   CHOLHDL 7.2 04/06/2015 0535   VLDL 41* 04/06/2015 0535   LDLCALC 132* 04/06/2015 0535   HgbA1c:  Lab Results  Component Value Date   HGBA1C 8.9* 01/01/2015   Urine Drug Screen: No results found for: LABOPIA, COCAINSCRNUR, LABBENZ, AMPHETMU, THCU, LABBARB    IMAGING  [name] Contrast 04/04/2015    Acute LEFT parietal periventricular deep white matter infarct, nonhemorrhagic. Widespread foci of microhemorrhage throughout the brain, likely sequelae of hypertensive cerebrovascular disease. Advanced atrophy with extensive small vessel disease along with scattered areas of lacunar infarction. Cervical spondylosis.    PHYSICAL EXAM  Pleasant middle aged male not in distress. . Afebrile. Head is nontraumatic. Neck is supple without bruit.    Cardiac exam no murmur or gallop. Lungs are clear to auscultation. Distal pulses are well felt.  Neurological Exam ;  Awake  Alert oriented x 3. Normal speech and language.eye movements full without nystagmus.fundi were not visualized. Vision acuity and fields appear normal. Hearing is normal. Palatal movements are normal. Face symmetric. Tongue midline. Normal strength, tone, reflexes and coordination. Normal sensation. Gait deferred.      ASSESSMENT/PLAN [name] is  a 70 y.o. male with history of iron deficiency anemia, chronic kidney disease, previous stroke, coronary artery disease, diabetes mellitus, hypertension and hyperlipidemia, admitted on 04/03/2015 for acute gastrointestinal hemorrhage  presenting with generalized weakness. He did not receive IV t-PA due to ongoing GI bleed.  Stroke:  Right brain subcortical basal ganglia secondary to small vessel disease.  Resultant resolution of deficits.  MRI  Acute LEFT parietal periventricular deep white matter infarct.  MRA  not  performed  Carotid Doppler - pending  2D Echo will order  LDL- 132  HgbA1c pending  VTE prophylaxis - SCDs  Diet Carb Modified Fluid consistency:: Thin; Room service appropriate?: Yes  aspirin 81 mg daily prior to admission, now on No antithrombotic secondary to GI bleed.  Ongoing aggressive stroke risk factor management  Therapy recommendations: No follow-up therapies recommended.  Disposition: Pending  Hypertension  Stable  Permissive hypertension (OK if < 220/120) but gradually normalize in 5-7 days  Hyperlipidemia  Home meds: Lipitor 80 mg daily resumed in hospital  LDL 132, goal < 70  Question compliance with Lipitor  Continue statin at discharge  Diabetes  HgbA1c pending, goal < 7.0  Controlled  Other Stroke Risk Factors  Advanced age  Cigarette smoker, advised to stop smoking  Hx stroke/TIA  Family hx stroke (father)  Coronary artery disease   Other Active Problems  Anemia  Chronic kidney disease  Hospital day # [place] PA-C [facility] Pager [phone] 04/06/2015, 4:30 PM I have personally examined this patient, reviewed notes, independently viewed imaging studies, participated in medical decision making and plan of care. I have made any additions or clarifications directly to the above note. Agree with note above. He   presented with  Acute GI hemorrhage but no focal neurological symptoms but MRI shows tiny silent periventricular lacunar infarct  due to small vessel disease and hypotension  and  He remains at risk for neurological worsening, recurrent stroke/TIA and needs ongoing stroke evaluation and aggressive risk factor modification.    Antony Contras, MD Medical Director [facility] Pager: [phone] 04/06/2015 6:58 PM     To contact Stroke Continuity provider, please refer to [email or web address] After hours, contact General Neurology

## 2015-04-06 NOTE — Procedures (Signed)
See full report - scanned  Several small non-bleeding AVM's scattered throughout small bowel. Do not think endoscopic treatment would make much if any difference long-term as there are likely more AVM's than seen. Supportive Tx w/ iron and EPo makes sense.  Anti-PLT agents not a contraindication but would avoid clopidogrel if possible.

## 2015-04-06 NOTE — Progress Notes (Signed)
VASCULAR LAB PRELIMINARY  PRELIMINARY  PRELIMINARY  PRELIMINARY  Carotid duplex  completed.    Preliminary report:  Bilateral:  1-39% ICA stenosis.  Vertebral artery flow is antegrade.      [name], RVT 04/06/2015, 4:46 PM

## 2015-04-06 NOTE — Progress Notes (Signed)
Patient ID: [name], male   DOB: [birth year], 70 y.o.   MRN: [id number]    Progress Note   Subjective  Feels ok - no complaints . Asking about going home Has already passed the capsule  Capsule Endoscopy- several small  AVM's scattered through small bowel, no active bleeding   Objective   Vital signs in last 24 hours: Temp:  [98.1 F (36.7 C)-98.5 F (36.9 C)] 98.2 F (36.8 C) (01/20 1006) Pulse Rate:  [80-84] 84 (01/20 1006) Resp:  [18] 18 (01/20 1006) BP: (146-154)/(78-80) 146/78 mmHg (01/20 1006) SpO2:  [94 %-100 %] 94 % (01/20 1006) Last BM Date: 04/05/15 General:    AA male  in NAD Heart:  Regular rate and rhythm; no murmurs Lungs: Respirations even and unlabored, lungs CTA bilaterally Abdomen:  Soft, nontender and nondistended. Normal bowel sounds. Extremities:  Without edema. Neurologic:  Alert and oriented,  grossly normal neurologically. Psych:  Cooperative. Normal mood and affect.  Lab Results:  Recent Labs  04/04/15 0551 04/05/15 0557 04/06/15 0535  WBC  --  6.5 5.8  HGB 6.6* 9.0* 9.3*  HCT 20.2* 26.9* 28.0*  PLT  --  309 337     Assessment / Plan:     #1  70 yo male with hx of chronic Anemia- multifactorial with CKD stage IV and hx of small bowel AVM's admitted with weakness, dizziness - and drop in hgb to 6.6  From baseline 8 range  Pt heme negative on admit Capsule endoscopy this admit with finding of several small  AVM's scattered though small bowel  He may ooze periodically Needs very close monitoring of hgb as outpt- and would keep hgb a bit higher in 8 range- transfuse as needed.  continue Fe infusions and Aranespt through Dr Martha Clan   Daily PPI  #2 New CVA this admit- likely accounting for dizziness etc  Ok from [employer] standpoint to use antiplatelet Rx- just needs ongoing monitoring of hgb's - would avoid clopidogrel and similar agents if possible  Would ask PCP or hematology to check hgb one week after discharge then weekly for a few weeks  to assure stability. Gi available,will sign off   LOS: 3 days   Amy Esterwood  04/06/2015, 11:48 AM     GI Attending   I have taken an interval history, reviewed the chart and examined the patient. I agree with the Advanced Practitioner's note, impression and recommendations.   Gatha Mayer, MD, [facility] [phone] (pager) [phone] after 5 PM, weekends and holidays  04/06/2015 3:34 PM

## 2015-04-06 NOTE — Progress Notes (Signed)
OT Screen/ Cancellation Note  Patient Details Name: [name] MRN: [id number] DOB: March 17, 1946   Cancelled Treatment:     Reason assessment not complete/pt not seen: Orders received and chart reviewed. Pt was screened by OT this morning and reports no needs. Per PT assessment, pt is Mod I / baseline level. Pt has been ambulating in room w/o AD and was returning from bathroom upon OT arrival. Discussed role of occupational therapy with pt, he denies needs at this time & appears at baseline level, will sign off. Screening only.  Carlynn Herald Amy Beth Dixon, OTR/L 04/06/2015, 7:45 AM

## 2015-04-06 NOTE — Progress Notes (Signed)
PROGRESS NOTE  [name] [id number] DOB: 1945/10/10 DOA: 04/03/2015 PCP: Troy Pear., NP  Brief History 70 year old male with a history of diabetes mellitus, stroke, iron deficiency anemia, CKD stage IV, ischemic cardiomyopathy presented with one-week history of melanotic stool, dyspnea on exertion, and generalized weakness. The patient saw his PCP on 09/01/2015. Point-of-care hemoglobin was 5.5. The patient was sent to the emergency department where repeat hemoglobin was 6.3. Notably, the patient has been receiving Feraheme and Aranesp through Troy. [place], last dose approximately 2 wk prior to admission.Last EGD and Colon done per [name] in [facility] for same reasons in 2013- EGD negative, Colons with Diverticulosis and one adenomatous polyp. Patient also has had He had enteroscopy in 2014 per Troy. 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 [facility] was consulted and pt underwent capsule endoscopy on 04/05/15. On the morning of 04/04/15, the pt woke up with dizziness and blurry vision. Assessment/Plan: Symptomatic anemia/chronic blood loss anemia/melena -Appreciate [facility]--previously followed Troy. Ardis Hughs -04/03/15 FOBT negative -Transfuse 2 additional units PRBC (total 3 units) -04/05/15--capsule endoscopy-->several small AVM's scattered through small bowel, no active bleeding -okay from GI standpoint to start ASA -once weekly CBC after d/c for a month Acute Nonhemorrhagic stroke -04/04/2015 MRI brain--acute left parietal periventricular infarct with widespread microhemorrhages -The patient woke up on the morning of 04/04/2015 with blurry vision and dizziness -not tPA candidate due to GIB -Appreciate neurology -carotid duplex--negative for hemodynamically significant stenosis -echo--pending -LDL--132 -HbA1c--pending -PT/OT--no follow up  -okay from GI to start ASA-->start Acute on chronic renal failure CKD stage  IV -Baseline creatinine 3.0-3.2 -Likely secondary to hemodynamic changes -Anticipate improvement with PRBC transfusion Ischemic cardiomyopathy -11/01/2012 echo EF 35-40 percent, grade 1 DD -Clinically compensated Diabetes mellitus type II, uncontrolled -01/01/2015 hemoglobin A1c 8.9 -Continue Lantus--decreased to 10 units due to mild hypoglycemia> -Continue NovoLog sliding scale GERD  -Continue PPI  Hypertension -Discontinue diltiazem as the patient is on 2 calcium channel blockers -Continue amlodipine -Added carvedilol in the setting of cardiomyopathy-->increase dose -Discontinue lisinopril in the setting of acute on chronic renal failure Tobacco abuse -Patient continues to smoke -Tobacco cessation discussed Hyperlipidemia -Started Lipitor 80 mg daily  Family Communication: Pt at beside Disposition Plan: Home 04/07/15 when stroke work up complete     Procedures/Studies: Dg Chest 2 View  04/03/2015  CLINICAL DATA:  Anemia.  Cough for 2 weeks EXAM: CHEST  2 VIEW COMPARISON:  09/04/2014 FINDINGS: Normal heart size and mediastinal contours. No acute infiltrate or edema. No effusion or pneumothorax. No acute osseous findings. IMPRESSION: No active cardiopulmonary disease. Electronically Signed   By: [name] M.D.   On: 04/03/2015 11:16   [name] Contrast  04/04/2015  CLINICAL DATA:  Dizziness with new visual disturbance. EXAM: MRI HEAD WITHOUT CONTRAST TECHNIQUE: Multiplanar, multiecho pulse sequences of the brain and surrounding structures were obtained without intravenous contrast. COMPARISON:  03/23/2013 CT Head. FINDINGS: There is a small area of restricted diffusion in the LEFT parietal periventricular white matter, 5 mm in diameter, consistent with acute infarction. This lies within the LEFT MCA territory. No other areas of restricted diffusion are seen. No acute hemorrhage, mass lesion, or extra-axial fluid. Hydrocephalus ex vacuo. Extensive white matter disease  affects the periventricular and subcortical regions, also affecting the brainstem. Scattered areas of lacunar infarction are noted, widespread. Numerous foci of chronic hemorrhage are seen throughout the white matter, and deep nuclei, brainstem,  and cerebellum. Largest area involves the LEFT basal ganglia specifically LEFT caudate greater than lentiform nucleus. These hemosiderin deposits are consistent with chronic hypertensive cerebrovascular disease. Cerebral amyloid angiopathy less favored. No indication that the acute abnormality is hemorrhagic. Unremarkable pituitary and cerebellar tonsils. Cervical spondylosis with incompletely visualized disc herniations at C3-4 and C4-5 resulting in mild cord flattening. Flow voids are maintained. No superficial siderosis or intravascular effect of Feraheme. Extracranial soft tissues unremarkable. IMPRESSION: Acute LEFT parietal periventricular deep white matter infarct, nonhemorrhagic. Widespread foci of microhemorrhage throughout the brain, likely sequelae of hypertensive cerebrovascular disease. Advanced atrophy with extensive small vessel disease along with scattered areas of lacunar infarction. Cervical spondylosis. Electronically Signed   By: [name] M.D.   On: 04/04/2015 17:14         Subjective: Patient denies fevers, chills, headache, chest pain, dyspnea, nausea, vomiting, diarrhea, abdominal pain, dysuria, hematuria. Patient denies any further dizziness, visual disturbance, dysarthria.   Objective: Filed Vitals:   04/05/15 2034 04/06/15 0509 04/06/15 1006 04/06/15 1652  BP: 154/80 150/79 146/78 164/83  Pulse: 81 80 84 85  Temp: 98.5 F (36.9 C) 98.1 F (36.7 C) 98.2 F (36.8 C) 98.7 F (37.1 C)  TempSrc: Oral Oral Oral Oral  Resp: 18 18 18 16   Height:      Weight:      SpO2: 100% 100% 94% 98%    Intake/Output Summary (Last 24 hours) at 04/06/15 1734 Last data filed at 04/06/15 1400  Gross per 24 hour  Intake    822 ml  Output     350 ml  Net    472 ml   Weight change: -0.282 kg (-10 oz) Exam:   General:  Pt is alert, follows commands appropriately, not in acute distress  HEENT: No icterus, No thrush, No neck mass, [place]/AT  Cardiovascular: RRR, S1/S2, no rubs, no gallops  Respiratory: CTA bilaterally, no wheezing, no crackles, no rhonchi  Abdomen: Soft/+BS, non tender, non distended, no guarding  Extremities: No edema, No lymphangitis, No petechiae, No rashes, no synovitis  Data Reviewed: Basic Metabolic Panel:  Recent Labs Lab 04/03/15 1040 04/04/15 0551 04/06/15 0535  NA 140 140 138  K 5.1 4.7 4.3  CL 112* 114* 112*  CO2 20* 21* 21*  GLUCOSE 134* 79 127*  BUN 41* 37* 28*  CREATININE 3.76* 3.52* 3.29*  CALCIUM 8.6* 8.1* 8.1*   Liver Function Tests:  Recent Labs Lab 04/03/15 1040  AST 13*  ALT 12*  ALKPHOS 120  BILITOT 0.3  PROT 6.4*  ALBUMIN 2.6*   No results for input(s): LIPASE, AMYLASE in the last 168 hours. No results for input(s): AMMONIA in the last 168 hours. CBC:  Recent Labs Lab 04/03/15 0950 04/03/15 1040 04/04/15 0551 04/05/15 0557 04/06/15 0535  WBC  --  5.9  --  6.5 5.8  NEUTROABS  --  4.0  --   --   --   HGB 5.5* 6.3* 6.6* 9.0* 9.3*  HCT  --  20.0* 20.2* 26.9* 28.0*  MCV  --  87.3  --  83.8 83.6  PLT  --  435*  --  309 337   Cardiac Enzymes:  Recent Labs Lab 04/03/15 1040  TROPONINI <0.03   BNP: Invalid input(s): POCBNP CBG:  Recent Labs Lab 04/05/15 1606 04/05/15 2111 04/06/15 0736 04/06/15 1157 04/06/15 1624  GLUCAP 160* 193* 72 113* 211*    No results found for this or any previous visit (from the past 240 hour(s)).   Scheduled  Meds: . sodium chloride   Intravenous Once  . sodium chloride   Intravenous Once  . amLODipine  5 mg Oral Daily  . aspirin  325 mg Oral Daily  . atorvastatin  80 mg Oral q1800  . carvedilol  3.125 mg Oral BID WC  . feeding supplement (GLUCERNA SHAKE)  237 mL Oral TID BM  . gabapentin  600 mg Oral QHS  .  insulin aspart  0-5 Units Subcutaneous QHS  . insulin aspart  0-9 Units Subcutaneous TID WC  . insulin glargine  10 Units Subcutaneous QHS  . pantoprazole  40 mg Oral Daily   Continuous Infusions:    Shaelyn Decarli, DO  [facility] Pager [phone]  If 7PM-7AM, please contact night-coverage [email or web address] Password [email or web address] 04/06/2015, 5:34 PM   LOS: 3 days

## 2015-04-07 ENCOUNTER — Inpatient Hospital Stay (HOSPITAL_COMMUNITY): Payer: Commercial Managed Care - HMO

## 2015-04-07 LAB — CBC
HCT: 29.1 % — ABNORMAL LOW (ref 39.0–52.0)
HEMOGLOBIN: 9.8 g/dL — AB (ref 13.0–17.0)
MCH: 28.2 pg (ref 26.0–34.0)
MCHC: 33.7 g/dL (ref 30.0–36.0)
MCV: 83.6 fL (ref 78.0–100.0)
Platelets: 368 10*3/uL (ref 150–400)
RBC: 3.48 MIL/uL — AB (ref 4.22–5.81)
RDW: 14.5 % (ref 11.5–15.5)
WBC: 6.7 10*3/uL (ref 4.0–10.5)

## 2015-04-07 LAB — BASIC METABOLIC PANEL
ANION GAP: 8 (ref 5–15)
BUN: 33 mg/dL — AB (ref 6–20)
CHLORIDE: 111 mmol/L (ref 101–111)
CO2: 21 mmol/L — ABNORMAL LOW (ref 22–32)
Calcium: 8.2 mg/dL — ABNORMAL LOW (ref 8.9–10.3)
Creatinine, Ser: 3.54 mg/dL — ABNORMAL HIGH (ref 0.61–1.24)
GFR calc Af Amer: 19 mL/min — ABNORMAL LOW (ref >60)
GFR calc non Af Amer: 16 mL/min — ABNORMAL LOW (ref >60)
Glucose, Bld: 97 mg/dL (ref 65–99)
POTASSIUM: 4.5 mmol/L (ref 3.5–5.1)
SODIUM: 140 mmol/L (ref 135–145)

## 2015-04-07 LAB — GLUCOSE, CAPILLARY
GLUCOSE-CAPILLARY: 62 mg/dL — AB (ref 65–99)
Glucose-Capillary: 140 mg/dL — ABNORMAL HIGH (ref 65–99)

## 2015-04-07 LAB — HEMOGLOBIN A1C
Hgb A1c MFr Bld: 7.8 % — ABNORMAL HIGH (ref 4.8–5.6)
Mean Plasma Glucose: 177 mg/dL

## 2015-04-07 MED ORDER — CARVEDILOL 12.5 MG PO TABS: 12.5000 mg | ORAL_TABLET | Freq: Two times a day (BID) | ORAL | Status: DC

## 2015-04-07 MED ORDER — GLUCERNA SHAKE PO LIQD: 237.0000 mL | Freq: Three times a day (TID) | ORAL | Status: DC

## 2015-04-07 MED ORDER — ASPIRIN 325 MG PO TABS: 325.0000 mg | ORAL_TABLET | Freq: Every day | ORAL | Status: DC

## 2015-04-07 MED ORDER — GABAPENTIN 300 MG PO CAPS: 600.0000 mg | ORAL_CAPSULE | Freq: Every day | ORAL | Status: DC

## 2015-04-07 NOTE — Progress Notes (Signed)
  Echocardiogram 2D Echocardiogram has been performed.  Darlina Sicilian M 04/07/2015, 9:35 AM

## 2015-04-07 NOTE — Discharge Summary (Addendum)
Physician Discharge Summary  WELCOME PAPALE [id number] DOB: [birth year] DOA: 04/03/2015  PCP: [name], NP  Admit date: 04/03/2015 Discharge date: 04/07/2015  Recommendations for Outpatient Follow-up:  1. Pt will need to follow up with PCP in 2 weeks post discharge 2. Please obtain CBC once weekly x 4 weeks starting 04/12/15 3. Please obtain BMP on 04/12/15  Discharge Diagnoses:  Symptomatic anemia/chronic blood loss anemia/melena -Appreciate [facility]--previously followed Dr. 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, no active bleeding -okay from GI standpoint to start ASA -once weekly CBC after d/c for a month Acute Nonhemorrhagic stroke -04/04/2015 MRI brain--acute left parietal periventricular infarct with widespread microhemorrhages -The patient woke up on the morning of 04/04/2015 with blurry vision and dizziness -not tPA candidate due to GIB -Appreciate neurology -carotid duplex--negative for hemodynamically significant stenosis -echo--EF 45-50%, grade 1 DD, no WMA -LDL--132 -HbA1c--7.8 -PT/OT--no follow up  -okay from GI to start ASA-->start Acute on chronic renal failure CKD stage IV -Baseline creatinine 3.2-3.5 -Likely secondary to hemodynamic changes -Anticipate improvement with PRBC transfusion -will not restart lisinopril Ischemic cardiomyopathy -11/01/2012 echo EF 35-40 percent, grade 1 DD -Clinically compensated -carvedilol started and titrated up to 12.5 mg bid Diabetes mellitus type II, uncontrolled -01/01/2015 hemoglobin A1c 8.9 -Continue Lantus-->decreased to 10 units due to mild hypoglycemia -d/c home with prior home dose -instructed pt to continue to check CBG and keep glycemic log with which to to take to his PCP -Continue NovoLog sliding scale GERD  -Continue PPI  Hypertension -Discontinue  diltiazem as the patient is on 2 calcium channel blockers -Continue amlodipine -Added carvedilol in the setting of cardiomyopathy-->increase dose -Discontinue lisinopril in the setting of acute on chronic renal failure Tobacco abuse -Patient continues to smoke -Tobacco cessation discussed Hyperlipidemia -Started Lipitor 80 mg daily  Discharge Condition: stable  Disposition:  Follow-up Information    Follow up with SETHI,PRAMOD, MD In 2 months.   Specialties:  Neurology, Radiology   Contact information:   [address] [address] [phone]       Follow up with Volanda Napoleon, MD In 2 weeks.   Specialty:  Oncology   Contact information:   [place] [address] [phone]     home  Diet:carb modified Wt Readings from Last 3 Encounters:  04/06/15 68.9 kg (151 lb 14.4 oz)  04/03/15 66.679 kg (147 lb)  03/15/15 69.854 kg (154 lb)    History of present illness:  70 year old male with a history of diabetes mellitus, stroke, iron deficiency anemia, CKD stage IV, ischemic cardiomyopathy presented with one-week history of melanotic stool, dyspnea on exertion, and generalized weakness. The patient saw his PCP on 09/01/2015. Point-of-care hemoglobin was 5.5. The patient was sent to the emergency department where repeat hemoglobin was 6.3. Notably, the patient has been receiving Feraheme and Aranesp through Dr. Marin Olp, last dose approximately 2 wk prior to admission.Last EGD and Colon done per Dr Elita Quick in [facility] for same reasons in 2013- EGD negative, Colons with Diverticulosis and one adenomatous polyp. Patient also has had He had enteroscopy in 2014 per Dr. 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  GI was consulted and pt underwent capsule endoscopy on 04/05/15. On the morning of 04/04/15, the pt woke up with dizziness and blurry vision.  MRI showed acute left parietal  periventricular infarct. Neurology was  consulted. Full stroke workup was finished. The patient was cleared for discharge.  Consultants: Neurology [facility]   Discharge Exam: Filed Vitals:   04/07/15 0516 04/07/15 0845  BP: 151/77 153/81  Pulse: 78 76  Temp: 98.5 F (36.9 C) 98 F (36.7 C)  Resp: 17 20   Filed Vitals:   04/06/15 2103 04/06/15 2153 04/07/15 0516 04/07/15 0845  BP: 160/82  151/77 153/81  Pulse: 84  78 76  Temp: 98.3 F (36.8 C)  98.5 F (36.9 C) 98 F (36.7 C)  TempSrc: Oral   Oral  Resp: 16  17 20   Height:      Weight:  68.9 kg (151 lb 14.4 oz)    SpO2: 100%  97% 98%   General: A&O x 3, NAD, pleasant, cooperative Cardiovascular: RRR, no rub, no gallop, no S3 Respiratory: CTAB, no wheeze, no rhonchi Abdomen:soft, nontender, nondistended, positive bowel sounds Extremities: No edema, No lymphangitis, no petechiae  Discharge Instructions      Discharge Instructions    Diet - low sodium heart healthy    Complete by:  As directed      Increase activity slowly    Complete by:  As directed             Medication List    STOP taking these medications        aspirin EC 81 MG tablet  Replaced by:  aspirin 325 MG tablet     diltiazem 120 MG 24 hr capsule  Commonly known as:  DILACOR XR     hydrALAZINE 10 MG tablet  Commonly known as:  APRESOLINE     lisinopril 20 MG tablet  Commonly known as:  PRINIVIL,ZESTRIL     pantoprazole 40 MG tablet  Commonly known as:  PROTONIX     potassium chloride SA 20 MEQ tablet  Commonly known as:  KLOR-CON M20      TAKE these medications        amLODipine 5 MG tablet  Commonly known as:  NORVASC  Take 5 mg by mouth daily.     aspirin 325 MG tablet  Take 1 tablet (325 mg total) by mouth daily.     atorvastatin 80 MG tablet  Commonly known as:  LIPITOR  Take 1 tablet (80 mg total) by mouth daily at 6 PM.     carvedilol 12.5 MG tablet  Commonly known as:  COREG  Take 1 tablet (12.5 mg total) by  mouth 2 (two) times daily with a meal.     feeding supplement (GLUCERNA SHAKE) Liqd  Take 237 mLs by mouth 3 (three) times daily between meals.     fluticasone 50 MCG/ACT nasal spray  Commonly known as:  FLONASE  Place 2 sprays into both nostrils daily as needed (congestion).     gabapentin 300 MG capsule  Commonly known as:  NEURONTIN  Take 2 capsules (600 mg total) by mouth at bedtime.     insulin glargine 100 unit/mL Sopn  Commonly known as:  LANTUS  Inject 25 Units into the skin at bedtime.     insulin lispro 100 UNIT/ML injection  Commonly known as:  HUMALOG  USE AS ADVISED - MAXIMUM 25 UNITS PER DAY. INJECT 7-7-5 UNITS PLUS SSI AS ADVISED.     Insulin Pen Needle 31G X 8 MM Misc  Commonly known as:  B-D ULTRAFINE III SHORT PEN  USE AS DIRECTED WITH INSULIN Dx: 250.02     multivitamin with minerals Tabs tablet  Take  1 tablet by mouth daily.     nitroGLYCERIN 0.4 MG SL tablet  Commonly known as:  NITROSTAT  Place 1 tablet (0.4 mg total) under the tongue every 5 (five) minutes as needed for chest pain (up to 3 doses).     omeprazole 40 MG capsule  Commonly known as:  PRILOSEC  Take 1 capsule (40 mg total) by mouth daily.     polyvinyl alcohol 1.4 % ophthalmic solution  Commonly known as:  LIQUIFILM TEARS  Place 1 drop into both eyes daily as needed (dry eyes).     Vitamin D (Ergocalciferol) 50000 units Caps capsule  Commonly known as:  DRISDOL  Take 50,000 Units by mouth every 7 (seven) days. On Mondays         The results of significant diagnostics from this hospitalization (including imaging, microbiology, ancillary and laboratory) are listed below for reference.    Significant Diagnostic Studies: Dg Chest 2 View  04/03/2015  CLINICAL DATA:  Anemia.  Cough for 2 weeks EXAM: CHEST  2 VIEW COMPARISON:  09/04/2014 FINDINGS: Normal heart size and mediastinal contours. No acute infiltrate or edema. No effusion or pneumothorax. No acute osseous findings. IMPRESSION:  No active cardiopulmonary disease. Electronically Signed   By: Monte Fantasia M.D.   On: 04/03/2015 11:16   [name] Contrast  04/04/2015  CLINICAL DATA:  Dizziness with new visual disturbance. EXAM: MRI HEAD WITHOUT CONTRAST TECHNIQUE: Multiplanar, multiecho pulse sequences of the brain and surrounding structures were obtained without intravenous contrast. COMPARISON:  03/23/2013 CT Head. FINDINGS: There is a small area of restricted diffusion in the LEFT parietal periventricular white matter, 5 mm in diameter, consistent with acute infarction. This lies within the LEFT MCA territory. No other areas of restricted diffusion are seen. No acute hemorrhage, mass lesion, or extra-axial fluid. Hydrocephalus ex vacuo. Extensive white matter disease affects the periventricular and subcortical regions, also affecting the brainstem. Scattered areas of lacunar infarction are noted, widespread. Numerous foci of chronic hemorrhage are seen throughout the white matter, and deep nuclei, brainstem, and cerebellum. Largest area involves the LEFT basal ganglia specifically LEFT caudate greater than lentiform nucleus. These hemosiderin deposits are consistent with chronic hypertensive cerebrovascular disease. Cerebral amyloid angiopathy less favored. No indication that the acute abnormality is hemorrhagic. Unremarkable pituitary and cerebellar tonsils. Cervical spondylosis with incompletely visualized disc herniations at C3-4 and C4-5 resulting in mild cord flattening. Flow voids are maintained. No superficial siderosis or intravascular effect of Feraheme. Extracranial soft tissues unremarkable. IMPRESSION: Acute LEFT parietal periventricular deep white matter infarct, nonhemorrhagic. Widespread foci of microhemorrhage throughout the brain, likely sequelae of hypertensive cerebrovascular disease. Advanced atrophy with extensive small vessel disease along with scattered areas of lacunar infarction. Cervical spondylosis.  Electronically Signed   By: Staci Righter M.D.   On: 04/04/2015 17:14     Microbiology: No results found for this or any previous visit (from the past 240 hour(s)).   Labs: Basic Metabolic Panel:  Recent Labs Lab 04/03/15 1040 04/04/15 0551 04/06/15 0535 04/07/15 0415  NA 140 140 138 140  K 5.1 4.7 4.3 4.5  CL 112* 114* 112* 111  CO2 20* 21* 21* 21*  GLUCOSE 134* 79 127* 97  BUN 41* 37* 28* 33*  CREATININE 3.76* 3.52* 3.29* 3.54*  CALCIUM 8.6* 8.1* 8.1* 8.2*   Liver Function Tests:  Recent Labs Lab 04/03/15 1040  AST 13*  ALT 12*  ALKPHOS 120  BILITOT 0.3  PROT 6.4*  ALBUMIN 2.6*   No results  for input(s): LIPASE, AMYLASE in the last 168 hours. No results for input(s): AMMONIA in the last 168 hours. CBC:  Recent Labs Lab 04/03/15 1040 04/04/15 0551 04/05/15 0557 04/06/15 0535 04/07/15 0415  WBC 5.9  --  6.5 5.8 6.7  NEUTROABS 4.0  --   --   --   --   HGB 6.3* 6.6* 9.0* 9.3* 9.8*  HCT 20.0* 20.2* 26.9* 28.0* 29.1*  MCV 87.3  --  83.8 83.6 83.6  PLT 435*  --  309 337 368   Cardiac Enzymes:  Recent Labs Lab 04/03/15 1040  TROPONINI <0.03   BNP: Invalid input(s): POCBNP CBG:  Recent Labs Lab 04/06/15 1157 04/06/15 1624 04/06/15 2022 04/07/15 0751 04/07/15 1156  GLUCAP 113* 211* 116* 62* 140*    Time coordinating discharge:  Greater than 30 minutes  Signed:  Conard Alvira, DO [facility] Pager: [phone] 04/07/2015, 3:23 PM

## 2015-04-08 ENCOUNTER — Telehealth: Payer: Self-pay | Admitting: Family

## 2015-04-08 NOTE — Telephone Encounter (Signed)
pls contact pt to arrange TCM follow up.

## 2015-04-09 ENCOUNTER — Encounter: Payer: Self-pay | Admitting: Internal Medicine

## 2015-04-09 ENCOUNTER — Encounter: Payer: Self-pay | Admitting: Family

## 2015-04-09 NOTE — Telephone Encounter (Signed)
PCP: [name], NP  Admit date: 04/03/2015 Discharge date: 04/07/2015  Recommendations for Outpatient Follow-up:  1. Pt will need to follow up with PCP in 2 weeks post discharge 2. Please obtain CBC once weekly x 4 weeks starting 04/12/15 3. Please obtain BMP on 04/12/15   Hospital follow up appt scheduled on Wednesday 04/18/15 at 10:45 am with [name], NP.   Discharge Diagnoses:  Symptomatic anemia/chronic blood loss anemia/melena -Appreciate [facility]--previously followed Dr. 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, no active bleeding -okay from GI standpoint to start ASA -once weekly CBC after d/c for a month Acute Nonhemorrhagic stroke -04/04/2015 MRI brain--acute left parietal periventricular infarct with widespread microhemorrhages -The patient woke up on the morning of 04/04/2015 with blurry vision and dizziness -not tPA candidate due to GIB -Appreciate neurology -carotid duplex--negative for hemodynamically significant stenosis -echo--EF 45-50%, grade 1 DD, no WMA -LDL--132 -HbA1c--7.8 -PT/OT--no follow up  -okay from GI to start ASA-->start Acute on chronic renal failure CKD stage IV -Baseline creatinine 3.2-3.5 -Likely secondary to hemodynamic changes -Anticipate improvement with PRBC transfusion -will not restart lisinopril Ischemic cardiomyopathy -11/01/2012 echo EF 35-40 percent, grade 1 DD -Clinically compensated -carvedilol started and titrated up to 12.5 mg bid Diabetes mellitus type II, uncontrolled -01/01/2015 hemoglobin A1c 8.9 -Continue Lantus-->decreased to 10 units due to mild hypoglycemia -d/c home with prior home dose -instructed pt to continue to check CBG and keep glycemic log with which to to take to his PCP -Continue NovoLog sliding scale GERD  -Continue PPI   Hypertension -Discontinue diltiazem as the patient is on 2 calcium channel blockers -Continue amlodipine -Added carvedilol in the setting of cardiomyopathy-->increase dose -Discontinue lisinopril in the setting of acute on chronic renal failure Tobacco abuse -Patient continues to smoke -Tobacco cessation discussed Hyperlipidemia -Started Lipitor 80 mg daily  Discharge Condition: stable  Transition Care Management Follow-up Telephone Call  How have you been since you were released from the hospital? Pt states he continues to feel slightly lightheaded.  Not worse than he felt in hospital.     Do you understand why you were in the hospital? yes   Do you understand the discharge instructions? yes  Items Reviewed:  Medications reviewed: yes  Allergies reviewed: yes  Dietary changes reviewed: yes, low sodium heart healthy; Glucerna three times per day--in between meals  Referrals reviewed: yes, encouraged patient to schedule follow up appt.   Functional Questionnaire:   Activities of Daily Living (ADLs):   He states they are independent in the following: ambulation, bathing and hygiene, feeding, continence, grooming, toileting and dressing States they require assistance with the following: none   Any transportation issues/concerns?: no   Any patient concerns? no   Confirmed importance and date/time of follow-up visits scheduled: yes   Confirmed with patient if condition begins to worsen call PCP or go to the ER: yes

## 2015-04-10 ENCOUNTER — Telehealth: Payer: Self-pay | Admitting: Neurology

## 2015-04-10 NOTE — Telephone Encounter (Signed)
Reviewed

## 2015-04-10 NOTE — Telephone Encounter (Signed)
Pt was in the hospital and saw Dr Leonie Man wants a call back from Dr Leonie Man dg

## 2015-04-10 NOTE — Telephone Encounter (Signed)
Rn call patient back and he needs to schedule a follow up appt. Pt verbalized understanding.

## 2015-04-17 ENCOUNTER — Encounter: Payer: Self-pay | Admitting: Family

## 2015-04-17 ENCOUNTER — Telehealth: Payer: Self-pay

## 2015-04-17 ENCOUNTER — Ambulatory Visit (HOSPITAL_BASED_OUTPATIENT_CLINIC_OR_DEPARTMENT_OTHER): Payer: Commercial Managed Care - HMO

## 2015-04-17 ENCOUNTER — Ambulatory Visit (HOSPITAL_BASED_OUTPATIENT_CLINIC_OR_DEPARTMENT_OTHER): Payer: Commercial Managed Care - HMO | Admitting: Family

## 2015-04-17 ENCOUNTER — Other Ambulatory Visit (HOSPITAL_BASED_OUTPATIENT_CLINIC_OR_DEPARTMENT_OTHER): Payer: Commercial Managed Care - HMO

## 2015-04-17 VITALS — BP 128/68 | HR 69 | Temp 97.3°F | Resp 16 | Ht 67.0 in | Wt 155.0 lb

## 2015-04-17 DIAGNOSIS — K254 Chronic or unspecified gastric ulcer with hemorrhage: Secondary | ICD-10-CM

## 2015-04-17 DIAGNOSIS — N189 Chronic kidney disease, unspecified: Secondary | ICD-10-CM

## 2015-04-17 DIAGNOSIS — D500 Iron deficiency anemia secondary to blood loss (chronic): Secondary | ICD-10-CM

## 2015-04-17 DIAGNOSIS — D509 Iron deficiency anemia, unspecified: Secondary | ICD-10-CM

## 2015-04-17 DIAGNOSIS — E119 Type 2 diabetes mellitus without complications: Secondary | ICD-10-CM | POA: Diagnosis not present

## 2015-04-17 DIAGNOSIS — K922 Gastrointestinal hemorrhage, unspecified: Secondary | ICD-10-CM | POA: Diagnosis not present

## 2015-04-17 DIAGNOSIS — N289 Disorder of kidney and ureter, unspecified: Secondary | ICD-10-CM

## 2015-04-17 DIAGNOSIS — D631 Anemia in chronic kidney disease: Secondary | ICD-10-CM

## 2015-04-17 DIAGNOSIS — D649 Anemia, unspecified: Secondary | ICD-10-CM

## 2015-04-17 LAB — COMPREHENSIVE METABOLIC PANEL
ALBUMIN: 2.4 g/dL — AB (ref 3.5–5.0)
ALT: 13 U/L (ref 0–55)
ANION GAP: 8 meq/L (ref 3–11)
AST: 10 U/L (ref 5–34)
Alkaline Phosphatase: 133 U/L (ref 40–150)
BUN: 43 mg/dL — ABNORMAL HIGH (ref 7.0–26.0)
CALCIUM: 8.9 mg/dL (ref 8.4–10.4)
CO2: 18 mEq/L — ABNORMAL LOW (ref 22–29)
CREATININE: 4.8 mg/dL — AB (ref 0.7–1.3)
Chloride: 117 mEq/L — ABNORMAL HIGH (ref 98–109)
EGFR: 13 mL/min/{1.73_m2} — ABNORMAL LOW (ref >90)
Glucose: 71 mg/dl (ref 70–140)
Potassium: 5.5 mEq/L — ABNORMAL HIGH (ref 3.5–5.1)
Sodium: 143 mEq/L (ref 136–145)
TOTAL PROTEIN: 6.3 g/dL — AB (ref 6.4–8.3)

## 2015-04-17 LAB — IRON AND TIBC
%SAT: 20 % (ref 20–55)
Iron: 51 ug/dL (ref 42–163)
TIBC: 259 ug/dL (ref 202–409)
UIBC: 207 ug/dL (ref 117–376)

## 2015-04-17 LAB — CBC WITH DIFFERENTIAL (CANCER CENTER ONLY)
BASO#: 0.1 10*3/uL (ref 0.0–0.2)
BASO%: 1.4 % (ref 0.0–2.0)
EOS%: 3.2 % (ref 0.0–7.0)
Eosinophils Absolute: 0.2 10*3/uL (ref 0.0–0.5)
HEMATOCRIT: 28.4 % — AB (ref 38.7–49.9)
HGB: 9.3 g/dL — ABNORMAL LOW (ref 13.0–17.1)
LYMPH#: 1.1 10*3/uL (ref 0.9–3.3)
LYMPH%: 17.4 % (ref 14.0–48.0)
MCH: 28.4 pg (ref 28.0–33.4)
MCHC: 32.7 g/dL (ref 32.0–35.9)
MCV: 87 fL (ref 82–98)
MONO#: 0.8 10*3/uL (ref 0.1–0.9)
MONO%: 12.7 % (ref 0.0–13.0)
NEUT%: 65.3 % (ref 40.0–80.0)
NEUTROS ABS: 4.1 10*3/uL (ref 1.5–6.5)
Platelets: 299 10*3/uL (ref 145–400)
RBC: 3.27 10*6/uL — AB (ref 4.20–5.70)
RDW: 15.3 % (ref 11.1–15.7)
WBC: 6.3 10*3/uL (ref 4.0–10.0)

## 2015-04-17 LAB — CHCC SATELLITE - SMEAR

## 2015-04-17 LAB — FERRITIN: FERRITIN: 30 ng/mL (ref 22–316)

## 2015-04-17 MED ORDER — SODIUM CHLORIDE 0.9 % IV SOLN
INTRAVENOUS | Status: DC
  Administered 2015-04-17: 10:00:00 via INTRAVENOUS

## 2015-04-17 MED ORDER — SODIUM CHLORIDE 0.9 % IV SOLN
510.0000 mg | Freq: Once | INTRAVENOUS | Status: AC
  Administered 2015-04-17: 510 mg via INTRAVENOUS
  Filled 2015-04-17: qty 17

## 2015-04-17 MED ORDER — FERUMOXYTOL INJECTION 510 MG/17 ML: 510.0000 mg | Freq: Once | INTRAVENOUS | Status: DC

## 2015-04-17 NOTE — Progress Notes (Signed)
Hematology and Oncology Follow Up Visit  [name] [id number] [birth year] 70 y.o. 04/17/2015   Principle Diagnosis:  1. Iron-deficiency anemia secondary to recurrent gastrointestinal bleeding. 2. 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  Current Therapy:   1. IV iron as indicated    Interim History:  [name] is here today for a follow-up. He was hospitalized last week with a GI bleed. His colonoscopy revealed multiple AVMs. He is now on a PPI. He also had a non hemorrhagic stroke during admission. He was send home on aspirin and there were adjustments made to his antihypertensive medications.  He is feeling much better today but is still experiencing some weakness and fatigue. He has had some dizziness at times but has had no syncopal episodes or falls.  He states that he is still having dark tarry stools. He received 2 units of blood while admitted and his hgb is now up to 9.3 with an MCV of 87.   He has some tingling in his feet at this time but states that it is mild and tolerable. No swelling or tenderness in his extremities.  No fever, chills, n/v, cough, rash, chest pain, palpitations, abdominal pain or changes in bowel or bladder habits.  He has maintained a good appetite and is staying well hydrated. His blood sugars are still not well controlled. He states that he was 226 this morning. His weight is stable.   Of note: Patient no longer receiving Aranesp due to nonhemorrhagic stroke   Medications:    Medication List       This list is accurate as of: 04/17/15  9:10 AM.  Always use your most recent med list.               amLODipine 5 MG tablet  Commonly known as:  NORVASC  Take 5 mg by mouth daily.     aspirin 325 MG tablet  Take 1 tablet (325 mg total) by mouth daily.     atorvastatin 80 MG tablet  Commonly known as:  LIPITOR  Take 1 tablet (80 mg total) by mouth daily at 6 PM.     carvedilol 12.5 MG tablet  Commonly known as:   COREG  Take 1 tablet (12.5 mg total) by mouth 2 (two) times daily with a meal.     feeding supplement (GLUCERNA SHAKE) Liqd  Take 237 mLs by mouth 3 (three) times daily between meals.     fluticasone 50 MCG/ACT nasal spray  Commonly known as:  FLONASE  Place 2 sprays into both nostrils daily as needed (congestion).     gabapentin 300 MG capsule  Commonly known as:  NEURONTIN  Take 2 capsules (600 mg total) by mouth at bedtime.     insulin glargine 100 unit/mL Sopn  Commonly known as:  LANTUS  Inject 25 Units into the skin at bedtime.     insulin lispro 100 UNIT/ML injection  Commonly known as:  HUMALOG  USE AS ADVISED - MAXIMUM 25 UNITS PER DAY. INJECT 7-7-5 UNITS PLUS SSI AS ADVISED.     Insulin Pen Needle 31G X 8 MM Misc  Commonly known as:  B-D ULTRAFINE III SHORT PEN  USE AS DIRECTED WITH INSULIN Dx: 250.02     multivitamin with minerals Tabs tablet  Take 1 tablet by mouth daily.     nitroGLYCERIN 0.4 MG SL tablet  Commonly known as:  NITROSTAT  Place 1 tablet (0.4 mg total) under the tongue every 5 (five)  minutes as needed for chest pain (up to 3 doses).     omeprazole 40 MG capsule  Commonly known as:  PRILOSEC  Take 1 capsule (40 mg total) by mouth daily.     polyvinyl alcohol 1.4 % ophthalmic solution  Commonly known as:  LIQUIFILM TEARS  Place 1 drop into both eyes daily as needed (dry eyes).     Vitamin D (Ergocalciferol) 50000 units Caps capsule  Commonly known as:  DRISDOL  Take 50,000 Units by mouth every 7 (seven) days. On Mondays        Allergies: No Known Allergies  Past Medical History, Surgical history, Social history, and Family History were reviewed and updated.  Review of Systems: All other 10 point review of systems is negative.   Physical Exam:  vitals were not taken for this visit.  Wt Readings from Last 3 Encounters:  04/06/15 151 lb 14.4 oz (68.9 kg)  04/03/15 147 lb (66.679 kg)  03/15/15 154 lb (69.854 kg)    Ocular: Sclerae  unicteric, pupils equal, round and reactive to light Ear-nose-throat: Oropharynx clear, dentition fair Lymphatic: No cervical supraclavicular or axillary adenopathy Lungs no rales or rhonchi, good excursion bilaterally Heart regular rate and rhythm, no murmur appreciated Abd soft, nontender, positive bowel sounds, no liver or spleen tip palpated on exam MSK no focal spinal tenderness, no joint edema Neuro: non-focal, well-oriented, appropriate affect  Lab Results  Component Value Date   WBC 6.3 04/17/2015   HGB 9.3* 04/17/2015   HCT 28.4* 04/17/2015   MCV 87 04/17/2015   PLT 299 04/17/2015   Lab Results  Component Value Date   FERRITIN 19* 04/04/2015   IRON 250* 04/04/2015   TIBC 286 04/04/2015   UIBC 36 04/04/2015   IRONPCTSAT 88* 04/04/2015   Lab Results  Component Value Date   RETICCTPCT 3.0* 03/15/2015   RBC 3.27* 04/17/2015   RETICCTABS 76.5 03/15/2015   No results found for: KPAFRELGTCHN, LAMBDASER, KAPLAMBRATIO No results found for: IGGSERUM, IGA, IGMSERUM No results found for: Odetta Pink, SPEI   Chemistry      Component Value Date/Time   NA 140 04/07/2015 0415   NA 133* 03/15/2015 1040   NA 139 07/27/2014 0821   K 4.5 04/07/2015 0415   K 5.6* 03/15/2015 1040   K 4.0 07/27/2014 0821   CL 111 04/07/2015 0415   CL 106 07/27/2014 0821   CO2 21* 04/07/2015 0415   CO2 19* 03/15/2015 1040   CO2 26 07/27/2014 0821   BUN 33* 04/07/2015 0415   BUN 36.9* 03/15/2015 1040   BUN 18 07/27/2014 0821   CREATININE 3.54* 04/07/2015 0415   CREATININE 3.7* 03/15/2015 1040   CREATININE 2.1* 07/27/2014 0821      Component Value Date/Time   CALCIUM 8.2* 04/07/2015 0415   CALCIUM 8.7 03/15/2015 1040   CALCIUM 9.2 07/27/2014 0821   ALKPHOS 120 04/03/2015 1040   ALKPHOS 147 03/15/2015 1040   ALKPHOS 117* 07/27/2014 0821   AST 13* 04/03/2015 1040   AST 11 03/15/2015 1040   AST 16 07/27/2014 0821   ALT 12* 04/03/2015  1040   ALT 12 03/15/2015 1040   ALT 16 07/27/2014 0821   BILITOT 0.3 04/03/2015 1040   BILITOT <0.30 03/15/2015 1040   BILITOT 0.60 07/27/2014 0821     Impression and Plan: [name] is 70 year old gentleman with multifactorial anemia (iron deficiency/renal insufficiency). He was hospitalized last week for a GI bleed and was found to have  multiple areas of AVMs. He is now an a PPI. He also had a non hemorrhagic stroke during admission. He can not be on an anticoagulant due to his history of GI bleeding but was sent home on Aspirin.  He received 2 units of blood while in the hospital. His hgb is now up to 9.3 with an MCV of 87.  He is symptomatic with weakness and fatigue.  We will go ahead and give him a does of feraheme while he is here today. We will see what his iron studies show and decide at that time if he needs a second dose in 8 days.  We will plan to see him back in 1 month for labs and follow-up.  He knows to contact us with any questions or concerns. We can certainly see him sooner if need be.   Eliezer Bottom, NP 1/31/20179:10 AM

## 2015-04-17 NOTE — Patient Instructions (Signed)

## 2015-04-17 NOTE — Telephone Encounter (Signed)
Dr Ennever aware of panic high creatinine. No new orders at this time. dph 

## 2015-04-18 ENCOUNTER — Ambulatory Visit: Payer: Commercial Managed Care - HMO | Admitting: Family

## 2015-04-18 ENCOUNTER — Telehealth: Payer: Self-pay | Admitting: Hematology & Oncology

## 2015-04-18 LAB — RETICULOCYTES: Reticulocyte Count: 3.1 % — ABNORMAL HIGH (ref 0.6–2.6)

## 2015-04-18 NOTE — Telephone Encounter (Signed)
Brien [id number] Requesting Provider: Dr. Penni Homans Treating Provider: Dr. Burney Gauze Visits: 6 Status: Approved Dates: 03/30/2015 - 10/14/2015       COPY SCANNED

## 2015-04-19 ENCOUNTER — Telehealth: Payer: Self-pay | Admitting: Family

## 2015-04-19 NOTE — Telephone Encounter (Signed)
Pt was no show 04/18/15 10:45am for 30 min OV, pt has not rescheduled, 2nd no show w/in 12 months, charge or no charge?

## 2015-04-19 NOTE — Telephone Encounter (Signed)
Yes please

## 2015-04-20 ENCOUNTER — Encounter: Payer: Self-pay | Admitting: Family

## 2015-04-20 NOTE — Telephone Encounter (Signed)
Marked to charge, mailing no show letter °

## 2015-04-26 ENCOUNTER — Telehealth: Payer: Self-pay | Admitting: Hematology & Oncology

## 2015-04-26 NOTE — Telephone Encounter (Signed)
Brien [id number] Treating Provider: Dr. Burney Gauze Visits: 12 Status: Approved Dates: 04/23/2015 - 10/22/2015  [id number] - Aranesp [id number] - Iron      COPY SCANNED

## 2015-05-07 ENCOUNTER — Ambulatory Visit (HOSPITAL_BASED_OUTPATIENT_CLINIC_OR_DEPARTMENT_OTHER): Payer: Commercial Managed Care - HMO

## 2015-05-07 ENCOUNTER — Ambulatory Visit (HOSPITAL_BASED_OUTPATIENT_CLINIC_OR_DEPARTMENT_OTHER): Payer: Commercial Managed Care - HMO | Admitting: Family

## 2015-05-07 ENCOUNTER — Telehealth: Payer: Self-pay | Admitting: *Deleted

## 2015-05-07 ENCOUNTER — Other Ambulatory Visit: Payer: Self-pay

## 2015-05-07 ENCOUNTER — Ambulatory Visit (HOSPITAL_COMMUNITY)
Admission: RE | Admit: 2015-05-07 | Discharge: 2015-05-07 | Disposition: A | Discharge status: Home / Self Care | Payer: Commercial Managed Care - HMO | Source: Ambulatory Visit | Attending: Hematology & Oncology | Admitting: Hematology & Oncology

## 2015-05-07 VITALS — BP 138/67 | HR 70 | Resp 18 | Wt 158.0 lb

## 2015-05-07 DIAGNOSIS — D631 Anemia in chronic kidney disease: Secondary | ICD-10-CM

## 2015-05-07 DIAGNOSIS — D509 Iron deficiency anemia, unspecified: Secondary | ICD-10-CM

## 2015-05-07 DIAGNOSIS — K922 Gastrointestinal hemorrhage, unspecified: Secondary | ICD-10-CM

## 2015-05-07 DIAGNOSIS — N189 Chronic kidney disease, unspecified: Secondary | ICD-10-CM | POA: Diagnosis not present

## 2015-05-07 DIAGNOSIS — D500 Iron deficiency anemia secondary to blood loss (chronic): Secondary | ICD-10-CM

## 2015-05-07 DIAGNOSIS — K254 Chronic or unspecified gastric ulcer with hemorrhage: Secondary | ICD-10-CM

## 2015-05-07 DIAGNOSIS — K219 Gastro-esophageal reflux disease without esophagitis: Secondary | ICD-10-CM

## 2015-05-07 DIAGNOSIS — E119 Type 2 diabetes mellitus without complications: Secondary | ICD-10-CM | POA: Diagnosis not present

## 2015-05-07 LAB — CBC WITH DIFFERENTIAL (CANCER CENTER ONLY)
BASO#: 0 10*3/uL (ref 0.0–0.2)
BASO%: 0.4 % (ref 0.0–2.0)
EOS ABS: 0.2 10*3/uL (ref 0.0–0.5)
EOS%: 2.2 % (ref 0.0–7.0)
HEMATOCRIT: 20.3 % — AB (ref 38.7–49.9)
HGB: 6.7 g/dL — CL (ref 13.0–17.1)
LYMPH#: 0.9 10*3/uL (ref 0.9–3.3)
LYMPH%: 9.7 % — ABNORMAL LOW (ref 14.0–48.0)
MCH: 30 pg (ref 28.0–33.4)
MCHC: 33 g/dL (ref 32.0–35.9)
MCV: 91 fL (ref 82–98)
MONO#: 0.9 10*3/uL (ref 0.1–0.9)
MONO%: 10.1 % (ref 0.0–13.0)
NEUT#: 6.9 10*3/uL — ABNORMAL HIGH (ref 1.5–6.5)
NEUT%: 77.6 % (ref 40.0–80.0)
Platelets: 297 10*3/uL (ref 145–400)
RBC: 2.23 10*6/uL — ABNORMAL LOW (ref 4.20–5.70)
RDW: 17.9 % — AB (ref 11.1–15.7)
WBC: 8.9 10*3/uL (ref 4.0–10.0)

## 2015-05-07 MED ORDER — SODIUM CHLORIDE 0.9 % IV SOLN
INTRAVENOUS | Status: DC
  Administered 2015-05-07: 12:00:00 via INTRAVENOUS

## 2015-05-07 MED ORDER — SODIUM CHLORIDE 0.9 % IV SOLN
510.0000 mg | Freq: Once | INTRAVENOUS | Status: AC
  Administered 2015-05-07: 510 mg via INTRAVENOUS
  Filled 2015-05-07: qty 17

## 2015-05-07 MED ORDER — FERUMOXYTOL INJECTION 510 MG/17 ML: 510.0000 mg | Freq: Once | INTRAVENOUS | Status: DC

## 2015-05-07 MED ORDER — SUCRALFATE 1 GM/10ML PO SUSP: 1.0000 g | Freq: Three times a day (TID) | ORAL | Status: DC

## 2015-05-07 NOTE — Progress Notes (Signed)
Hematology and Oncology Follow Up Visit  [name] [id number] Sep 03, 1945 70 y.o. 05/07/2015   Principle Diagnosis:  1. Iron-deficiency anemia secondary to recurrent gastrointestinal bleeding. 2. 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  Current Therapy:   1. IV iron as indicated 2. Supportive blood transfusion as needed     Interim History:  Mr. Pentecost is here today for a follow-up. He is feeling quite fatigued at this time and is still having dark tarry stools. His recent colonoscopy showed multiple AVM's.  He states that he is taking his Prilosec as directed but still having GERD.  He is eating and staying well hydrated. His weight is up 3 lbs since his last visit.  His wife has him on a healthier diet and he is avoiding sweets. His blood sugars have improved. He states that his blood glucose this morning was 147.  No fever, chills, n/v, cough, rash, chest pain, palpitations, abdominal pain or changes in  bladder habits. He is chewing ice. He has had some constipation off and on. He plans to try a stool softener and also Mirilax.   He has some numbness and tingling in his feet which is unchanged. No swelling or tenderness in his extremities.   Of note: Patient no longer receiving Aranesp due to nonhemorrhagic stroke   Medications:    Medication List       This list is accurate as of: 05/07/15 10:57 AM.  Always use your most recent med list.               amLODipine 5 MG tablet  Commonly known as:  NORVASC  Take 5 mg by mouth daily.     aspirin 325 MG tablet  Take 1 tablet (325 mg total) by mouth daily.     atorvastatin 80 MG tablet  Commonly known as:  LIPITOR  Take 1 tablet (80 mg total) by mouth daily at 6 PM.     carvedilol 12.5 MG tablet  Commonly known as:  COREG  Take 1 tablet (12.5 mg total) by mouth 2 (two) times daily with a meal.     feeding supplement (GLUCERNA SHAKE) Liqd  Take 237 mLs by mouth 3 (three) times daily  between meals.     fluticasone 50 MCG/ACT nasal spray  Commonly known as:  FLONASE  Place 2 sprays into both nostrils daily as needed (congestion).     gabapentin 300 MG capsule  Commonly known as:  NEURONTIN  Take 2 capsules (600 mg total) by mouth at bedtime.     insulin glargine 100 unit/mL Sopn  Commonly known as:  LANTUS  Inject 25 Units into the skin at bedtime.     insulin lispro 100 UNIT/ML injection  Commonly known as:  HUMALOG  USE AS ADVISED - MAXIMUM 25 UNITS PER DAY. INJECT 7-7-5 UNITS PLUS SSI AS ADVISED.     Insulin Pen Needle 31G X 8 MM Misc  Commonly known as:  B-D ULTRAFINE III SHORT PEN  USE AS DIRECTED WITH INSULIN Dx: 250.02     multivitamin with minerals Tabs tablet  Take 1 tablet by mouth daily.     nitroGLYCERIN 0.4 MG SL tablet  Commonly known as:  NITROSTAT  Place 1 tablet (0.4 mg total) under the tongue every 5 (five) minutes as needed for chest pain (up to 3 doses).     omeprazole 40 MG capsule  Commonly known as:  PRILOSEC  Take 1 capsule (40 mg total) by mouth daily.  polyvinyl alcohol 1.4 % ophthalmic solution  Commonly known as:  LIQUIFILM TEARS  Place 1 drop into both eyes daily as needed (dry eyes).     Vitamin D (Ergocalciferol) 50000 units Caps capsule  Commonly known as:  DRISDOL  Take 50,000 Units by mouth every 7 (seven) days. On Mondays        Allergies: No Known Allergies  Past Medical History, Surgical history, Social history, and Family History were reviewed and updated.  Review of Systems: All other 10 point review of systems is negative.   Physical Exam:  weight is 158 lb (71.668 kg). His blood pressure is 138/67 and his pulse is 70. His respiration is 18 and oxygen saturation is 96%.   Wt Readings from Last 3 Encounters:  05/07/15 158 lb (71.668 kg)  04/17/15 155 lb (70.308 kg)  04/06/15 151 lb 14.4 oz (68.9 kg)    Ocular: Sclerae unicteric, pupils equal, round and reactive to light Ear-nose-throat:  Oropharynx clear, dentition fair Lymphatic: No cervical supraclavicular or axillary adenopathy Lungs no rales or rhonchi, good excursion bilaterally Heart regular rate and rhythm, no murmur appreciated Abd soft, nontender, positive bowel sounds, no liver or spleen tip palpated on exam MSK no focal spinal tenderness, no joint edema Neuro: non-focal, well-oriented, appropriate affect  Lab Results  Component Value Date   WBC 8.9 05/07/2015   HGB 6.7* 05/07/2015   HCT 20.3* 05/07/2015   MCV 91 05/07/2015   PLT 297 05/07/2015   Lab Results  Component Value Date   FERRITIN 30 04/17/2015   IRON 51 04/17/2015   TIBC 259 04/17/2015   UIBC 207 04/17/2015   IRONPCTSAT 20 04/17/2015   Lab Results  Component Value Date   RETICCTPCT 3.0* 03/15/2015   RBC 2.23* 05/07/2015   RETICCTABS 76.5 03/15/2015   No results found for: KPAFRELGTCHN, LAMBDASER, KAPLAMBRATIO No results found for: IGGSERUM, IGA, IGMSERUM No results found for: Odetta Pink, SPEI   Chemistry      Component Value Date/Time   NA 143 04/17/2015 0852   NA 140 04/07/2015 0415   NA 139 07/27/2014 0821   K 5.5* 04/17/2015 0852   K 4.5 04/07/2015 0415   K 4.0 07/27/2014 0821   CL 111 04/07/2015 0415   CL 106 07/27/2014 0821   CO2 18* 04/17/2015 0852   CO2 21* 04/07/2015 0415   CO2 26 07/27/2014 0821   BUN 43.0* 04/17/2015 0852   BUN 33* 04/07/2015 0415   BUN 18 07/27/2014 0821   CREATININE 4.8* 04/17/2015 0852   CREATININE 3.54* 04/07/2015 0415   CREATININE 2.1* 07/27/2014 0821      Component Value Date/Time   CALCIUM 8.9 04/17/2015 0852   CALCIUM 8.2* 04/07/2015 0415   CALCIUM 9.2 07/27/2014 0821   ALKPHOS 133 04/17/2015 0852   ALKPHOS 120 04/03/2015 1040   ALKPHOS 117* 07/27/2014 0821   AST 10 04/17/2015 0852   AST 13* 04/03/2015 1040   AST 16 07/27/2014 0821   ALT 13 04/17/2015 0852   ALT 12* 04/03/2015 1040   ALT 16 07/27/2014 0821   BILITOT <0.30  04/17/2015 0852   BILITOT 0.3 04/03/2015 1040   BILITOT 0.60 07/27/2014 0821     Impression and Plan: Mr. Jacox is 70 year old gentleman with multifactorial anemia (iron deficiency/renal insufficiency). He is symptomatic with fatigue and continue to have dark tarry stools.  His Hgb today is down to 6.7 with an MCV of 91. We will go ahead and give him a  dose of Feraheme while he is here in the office.  He will come back tomorrow for 2 units of blood.  Also a prescription for Carafate for GERD was sent to his pharmacy.  We will plan to see him back in 6 weeks for labs and follow-up.  He knows to contact us with any questions or concerns. We can certainly see him sooner if need be.   Eliezer Bottom, NP 2/20/201710:57 AM

## 2015-05-07 NOTE — Patient Instructions (Signed)

## 2015-05-07 NOTE — Telephone Encounter (Signed)
Critical Value HGB 6.7 Laverna Peace NP notified. Orders will be placed.

## 2015-05-08 ENCOUNTER — Ambulatory Visit (HOSPITAL_BASED_OUTPATIENT_CLINIC_OR_DEPARTMENT_OTHER): Payer: Commercial Managed Care - HMO

## 2015-05-08 VITALS — BP 167/77 | HR 75 | Temp 98.2°F | Resp 18

## 2015-05-08 DIAGNOSIS — D509 Iron deficiency anemia, unspecified: Secondary | ICD-10-CM | POA: Diagnosis not present

## 2015-05-08 DIAGNOSIS — N189 Chronic kidney disease, unspecified: Secondary | ICD-10-CM

## 2015-05-08 DIAGNOSIS — D631 Anemia in chronic kidney disease: Secondary | ICD-10-CM

## 2015-05-08 DIAGNOSIS — D500 Iron deficiency anemia secondary to blood loss (chronic): Secondary | ICD-10-CM

## 2015-05-08 LAB — PREPARE RBC (CROSSMATCH)

## 2015-05-08 MED ORDER — SODIUM CHLORIDE 0.9 % IV SOLN
250.0000 mL | Freq: Once | INTRAVENOUS | Status: AC
  Administered 2015-05-08: 250 mL via INTRAVENOUS

## 2015-05-08 MED ORDER — FUROSEMIDE 10 MG/ML IJ SOLN
20.0000 mg | Freq: Once | INTRAMUSCULAR | Status: AC
  Administered 2015-05-08: 20 mg via INTRAVENOUS

## 2015-05-08 MED ORDER — ACETAMINOPHEN 325 MG PO TABS
650.0000 mg | ORAL_TABLET | Freq: Once | ORAL | Status: AC
  Administered 2015-05-08: 650 mg via ORAL

## 2015-05-08 MED ORDER — DIPHENHYDRAMINE HCL 25 MG PO CAPS
ORAL_CAPSULE | ORAL | Status: AC
  Filled 2015-05-08: qty 1

## 2015-05-08 MED ORDER — DIPHENHYDRAMINE HCL 25 MG PO CAPS
25.0000 mg | ORAL_CAPSULE | Freq: Once | ORAL | Status: AC
  Administered 2015-05-08: 25 mg via ORAL

## 2015-05-08 MED ORDER — ACETAMINOPHEN 325 MG PO TABS
ORAL_TABLET | ORAL | Status: AC
  Filled 2015-05-08: qty 2

## 2015-05-08 MED ORDER — FUROSEMIDE 10 MG/ML IJ SOLN
INTRAMUSCULAR | Status: AC
  Filled 2015-05-08: qty 4

## 2015-05-08 NOTE — Patient Instructions (Signed)

## 2015-05-09 ENCOUNTER — Encounter: Payer: Self-pay | Admitting: Hematology & Oncology

## 2015-05-09 ENCOUNTER — Telehealth: Payer: Self-pay | Admitting: Hematology & Oncology

## 2015-05-09 LAB — TYPE AND SCREEN
ABO/RH(D): AB POS
ANTIBODY SCREEN: NEGATIVE
UNIT DIVISION: 0
Unit division: 0

## 2015-05-09 NOTE — Telephone Encounter (Signed)
Troy [id number] Treating Provider: Dr. Burney Gauze Visits: 12 Status: Approved Dates: 04/17/2015 - 10/16/2015     [id number] - Aranesp [id number] - Iron      COPY SCANNED

## 2015-05-15 ENCOUNTER — Ambulatory Visit: Payer: Commercial Managed Care - HMO

## 2015-05-15 ENCOUNTER — Ambulatory Visit: Payer: Commercial Managed Care - HMO | Admitting: Hematology & Oncology

## 2015-05-15 ENCOUNTER — Other Ambulatory Visit: Payer: Commercial Managed Care - HMO

## 2015-05-16 ENCOUNTER — Telehealth: Payer: Self-pay | Admitting: *Deleted

## 2015-05-16 NOTE — Telephone Encounter (Signed)
Received Physician Order for Diabetic shoes [recert], completed as much as possible; forwarded to provider/SLS 03/01

## 2015-05-17 ENCOUNTER — Ambulatory Visit: Payer: Commercial Managed Care - HMO | Admitting: Hematology & Oncology

## 2015-05-17 ENCOUNTER — Ambulatory Visit: Payer: Commercial Managed Care - HMO

## 2015-05-17 ENCOUNTER — Other Ambulatory Visit: Payer: Commercial Managed Care - HMO

## 2015-05-21 ENCOUNTER — Telehealth: Payer: Self-pay | Admitting: Hematology & Oncology

## 2015-05-21 NOTE — Telephone Encounter (Signed)
Brien [id number] Treating Provider: Dr. Burney Gauze Visits: 12 Status: Approved Dates: 05/08/2015 - 11/06/2015     [id number]    COPY SCANNED      COPY SCANNED

## 2015-06-13 ENCOUNTER — Encounter: Payer: Self-pay | Admitting: Neurology

## 2015-06-13 ENCOUNTER — Ambulatory Visit (INDEPENDENT_AMBULATORY_CARE_PROVIDER_SITE_OTHER): Payer: Commercial Managed Care - HMO | Admitting: Neurology

## 2015-06-13 VITALS — BP 137/71 | HR 73 | Ht 67.0 in | Wt 156.0 lb

## 2015-06-13 DIAGNOSIS — I6381 Other cerebral infarction due to occlusion or stenosis of small artery: Secondary | ICD-10-CM

## 2015-06-13 DIAGNOSIS — I639 Cerebral infarction, unspecified: Secondary | ICD-10-CM

## 2015-06-13 NOTE — Progress Notes (Signed)
[facility] [address] [place]. [place] [address] [phone]       OFFICE FOLLOW-UP NOTE  [name] Date of Birth:  07/03/1945 Medical Record Number:  [id number]   HPI:70 year old male seen for first office follow-up visit following hospital admission for stroke in January 2017 [name] is an 70 y.o. male with a history of iron deficiency anemia, chronic kidney disease, previous stroke, coronary artery disease, diabetes mellitus, hypertension and hyperlipidemia, admitted on 04/03/2015 for acute gastrointestinal hemorrhage. An MRI of his brain was obtained because of complaints of weakness and visual changes, which showed a small left periventricular parietal ischemic infarction. He has a history of stroke one year ago. He's been taking aspirin daily. He has also been on a statin medication daily. NIH stroke score at the time of this evaluation was 0. It's unclear when patient's stroke occurred. LSN: Unclear tPA Given: No: No deficits and unclear when infarction occurred. Patient denying clearcut focal symptoms except generalized weakness and some mild visual dysfunction. LDL cholesterol was elevated 132. Hemoglobin A1c was elevated at 8.9. Transthoracic echo showed Left ventricular ejection fraction of 45-50% with mild hypertrophy. No definite clot. Carotid Doppler showed no significant extracranial stenosis. Patient states his done well since discharge. His right leg still not fully recovered and at times feels everybody) tired. He has quit smoking and not completely but cut back but is agreeable to do so. Is tolerating aspirin well without bruising or bleeding. States his blood pressure is under good control and today it is 137/71. His sugars also improved he has not had a follow-up hemoglobin A1c checked. ROS:   14 system review of systems is positive for leg swelling, shortness of breath, cough, snoring, constipation, blood in urine, importance, anemia, bruising,  allergies, skin sensitivity, numbness, weakness, snoring and all other systems negative  PMH:  Past Medical History  Diagnosis Date  . Hypertension   . High cholesterol   . GERD (gastroesophageal reflux disease)   . GI bleed     a. Recurrent GI bleed, tx with IV iron. b. Per heme notes - likely AVMs 04/2012 (tx with cauterization several months ago).  . Orthostatic hypotension     a. Tx with florinef.  . Hematuria     a. Urology note scan from 07/2012: cystoscopy without evidence for bladder lesion, only lateral hypertrophy of posterior urethra, bladder impression from BPH. b. Pt states he had "some tests" scheduled for later in July 2014.  . Stage III chronic kidney disease     a. Stage 3 (DM with complications ->CKD, peripheral neuropathy).  . Anemia, iron deficiency 03/24/2011    a. Recurrent GI bleed, tx with periodic iron infusions.  . 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 Sleep apnea     "had mask; couldn't sleep in it" (09/20/2012)  . Type II diabetes mellitus ([place])     a. Dx 1994, uncontrolled.   . Diabetic peripheral neuropathy ([place]) 2014    foot pain.  . Chronic lower back pain   . Fatty liver     on CT of 11/2010  . AVM (arteriovenous malformation) of duodenum, acquired     egds in 01/2012, 04/2011  . Polyp, colonic     Colonoscopy 01/2012 "benign" polyp  . CAD (coronary artery disease)     a. Cath 09/2012: moderate borderline CAD in mid LAD/small diagonal branch, mild RCA stenosis, to be managed medically   . BPH (benign prostatic hyperplasia)   . Prostate  cancer ([place])   . Stroke ([place])     x 2  . Peripheral neuropathy ([place])   . 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 Smoker     Social History:  Social History   Social History  . Marital Status: Married    Spouse Name: N/A  . Number of Children: 2  . Years of Education: N/A   Occupational History  . taken out of work due to back    Social History  Main Topics  . Smoking status: Current Some Day Smoker -- 1.00 packs/day for 43 years    Types: Cigarettes    Start date: 04/15/1968  . Smokeless tobacco: Never Used     Comment: 07-22--2016  still smoking  . Alcohol Use: No     Comment: 09/20/2012 "Used to; stopped ~ 2009; never had problem w/it"  . Drug Use: No  . Sexual Activity: No   Other Topics Concern  . Not on file   Social History Narrative   Regular exercise: rides horses   Caffeine use: occasionally    Medications:   Current Outpatient Prescriptions on File Prior to Visit  Medication Sig Dispense Refill  . amLODipine (NORVASC) 5 MG tablet Take 5 mg by mouth daily.  6  . aspirin 325 MG tablet Take 1 tablet (325 mg total) by mouth daily. (Patient taking differently: Take 81 mg by mouth daily. ) 30 tablet 0  . atorvastatin (LIPITOR) 80 MG tablet Take 1 tablet (80 mg total) by mouth daily at 6 PM. (Patient taking differently: Take 80 mg by mouth daily. ) 30 tablet 5  . carvedilol (COREG) 12.5 MG tablet Take 1 tablet (12.5 mg total) by mouth 2 (two) times daily with a meal. 60 tablet 1  . fluticasone (FLONASE) 50 MCG/ACT nasal spray Place 2 sprays into both nostrils daily as needed (congestion).   11  . gabapentin (NEURONTIN) 300 MG capsule Take 2 capsules (600 mg total) by mouth at bedtime. 30 capsule 0  . insulin glargine (LANTUS) 100 unit/mL SOPN Inject 25 Units into the skin at bedtime.    . insulin lispro (HUMALOG) 100 UNIT/ML injection USE AS ADVISED - MAXIMUM 25 UNITS PER DAY. INJECT 7-7-5 UNITS PLUS SSI AS ADVISED. (Patient taking differently: Inject 8 Units into the skin 3 (three) times daily as needed for high blood sugar (CBG >260). ) 10 mL 0  . Insulin Pen Needle (B-D ULTRAFINE III SHORT PEN) 31G X 8 MM MISC USE AS DIRECTED WITH INSULIN Dx: 250.02 100 each 5  . Multiple Vitamin (MULTIVITAMIN WITH MINERALS) TABS tablet Take 1 tablet by mouth daily.    . nitroGLYCERIN (NITROSTAT) 0.4 MG SL tablet Place 1 tablet (0.4 mg  total) under the tongue every 5 (five) minutes as needed for chest pain (up to 3 doses). 25 tablet 2  . omeprazole (PRILOSEC) 40 MG capsule Take 1 capsule (40 mg total) by mouth daily. 30 capsule 5  . Vitamin D, Ergocalciferol, (DRISDOL) 50000 UNITS CAPS capsule Take 50,000 Units by mouth every 7 (seven) days. On Mondays     No current facility-administered medications on file prior to visit.    Allergies:  No Known Allergies  Physical Exam General: well developed, well nourished Middle-age male, seated, in no evident distress Head: head normocephalic and atraumatic.  Neck: supple with no carotid or supraclavicular bruits Cardiovascular: regular rate and rhythm, no murmurs Musculoskeletal: no deformity Skin:  no rash/petichiae Vascular:  Normal pulses  all extremities Filed Vitals:   06/13/15 0912  BP: 137/71  Pulse: 73   Neurologic Exam Mental Status: Awake and fully alert. Oriented to place and time. Recent and remote memory intact. Attention span, concentration and fund of knowledge appropriate. Mood and affect appropriate.  Cranial Nerves: Fundoscopic exam reveals sharp disc margins. Pupils equal, briskly reactive to light. Extraocular movements full without nystagmus. Visual fields full to confrontation. Hearing intact. Facial sensation intact. Face, tongue, palate moves normally and symmetrically.  Motor: Normal bulk and tone. Normal strength in all tested extremity muscles.Diminished fine finger movements in the right hand. Orbits left over right upper extremity. Mild right leg stiffness or weakness. Sensory.: intact to touch ,pinprick .position and vibratory sensation.  Coordination: Rapid alternating movements normal in all extremities. Finger-to-nose and heel-to-shin performed accurately bilaterally. Gait and Station: Arises from chair without difficulty. Stance is normal. Gait demonstrates normal stride length and balance . Able to heel, toe and tandem walk without difficulty.    Reflexes: 1+ and symmetric. Toes downgoing.   NIHSS  0 Modified Rankin 1   ASSESSMENT: 70 year male with left pareiral white matter Infarct in January 2017  In setting of GI hemorrhage secondary to small vessel disease with vascular risk factors of hypertension, diabetes, hyperlipidemia    PLAN: I had a long d/w patient and his wife about his recent lacunar stroke, risk for recurrent stroke/TIAs, personally independently reviewed imaging studies and stroke evaluation results and answered questions.Continue aspirin 81 mg daily  for secondary stroke prevention and maintain strict control of hypertension with blood pressure goal below 130/90, diabetes with hemoglobin A1c goal below 6.5% and lipids with LDL cholesterol goal below 70 mg/dL. I strongly and encouraged the patient to quit smoking completely and to seek help from his primary care physician to do so. I also advised the patient to eat a healthy diet with plenty of whole grains, cereals, fruits and vegetables, exercise regularly and maintain ideal body weight.Greater than 50% of time during this 25 minute visit was spent on counseling,explanation of diagnosis, planning of further management, discussion with patient and family and coordination of care  Followup in the future with Cecille Rubin, NP in 6 months or call earlier if necessary Antony Contras, MD Note: This document was prepared with digital dictation and possible smart phrase technology. Any transcriptional errors that result from this process are unintentional

## 2015-06-13 NOTE — Patient Instructions (Signed)
I had a long d/w patient and his wife about his recent lacunar stroke, risk for recurrent stroke/TIAs, personally independently reviewed imaging studies and stroke evaluation results and answered questions.Continue aspirin 81 mg daily  for secondary stroke prevention and maintain strict control of hypertension with blood pressure goal below 130/90, diabetes with hemoglobin A1c goal below 6.5% and lipids with LDL cholesterol goal below 70 mg/dL. I strongly and encouraged the patient to quit smoking completely and to seek help from his primary care physician to do so. I also advised the patient to eat a healthy diet with plenty of whole grains, cereals, fruits and vegetables, exercise regularly and maintain ideal body weight Followup in the future with Cecille Rubin, NP in 6 months or call earlier if necessary Stroke Prevention Some medical conditions and behaviors are associated with an increased chance of having a stroke. You may prevent a stroke by making healthy choices and managing medical conditions. HOW CAN I REDUCE MY RISK OF HAVING A STROKE?   Stay physically active. Get at least 30 minutes of activity on most or all days.  Do not smoke. It may also be helpful to avoid exposure to secondhand smoke.  Limit alcohol use. Moderate alcohol use is considered to be:  No more than 2 drinks per day for men.  No more than 1 drink per day for nonpregnant women.  Eat healthy foods. This involves:  Eating 5 or more servings of fruits and vegetables a day.  Making dietary changes that address high blood pressure (hypertension), high cholesterol, diabetes, or obesity.  Manage your cholesterol levels.  Making food choices that are high in fiber and low in saturated fat, trans fat, and cholesterol may control cholesterol levels.  Take any prescribed medicines to control cholesterol as directed by your health care provider.  Manage your diabetes.  Controlling your carbohydrate and sugar intake is  recommended to manage diabetes.  Take any prescribed medicines to control diabetes as directed by your health care provider.  Control your hypertension.  Making food choices that are low in salt (sodium), saturated fat, trans fat, and cholesterol is recommended to manage hypertension.  Ask your health care provider if you need treatment to lower your blood pressure. Take any prescribed medicines to control hypertension as directed by your health care provider.  If you are 42-49 years of age, have your blood pressure checked every 3-5 years. If you are 19 years of age or older, have your blood pressure checked every year.  Maintain a healthy weight.  Reducing calorie intake and making food choices that are low in sodium, saturated fat, trans fat, and cholesterol are recommended to manage weight.  Stop drug abuse.  Avoid taking birth control pills.  Talk to your health care provider about the risks of taking birth control pills if you are over 59 years old, smoke, get migraines, or have ever had a blood clot.  Get evaluated for sleep disorders (sleep apnea).  Talk to your health care provider about getting a sleep evaluation if you snore a lot or have excessive sleepiness.  Take medicines only as directed by your health care provider.  For some people, aspirin or blood thinners (anticoagulants) are helpful in reducing the risk of forming abnormal blood clots that can lead to stroke. If you have the irregular heart rhythm of atrial fibrillation, you should be on a blood thinner unless there is a good reason you cannot take them.  Understand all your medicine instructions.  Make sure  that other conditions (such as anemia or atherosclerosis) are addressed. SEEK IMMEDIATE MEDICAL CARE IF:   You have sudden weakness or numbness of the face, arm, or leg, especially on one side of the body.  Your face or eyelid droops to one side.  You have sudden confusion.  You have trouble speaking  (aphasia) or understanding.  You have sudden trouble seeing in one or both eyes.  You have sudden trouble walking.  You have dizziness.  You have a loss of balance or coordination.  You have a sudden, severe headache with no known cause.  You have new chest pain or an irregular heartbeat. Any of these symptoms may represent a serious problem that is an emergency. Do not wait to see if the symptoms will go away. Get medical help at once. Call your local emergency services (911 in [place] Do not drive yourself to the hospital.   This information is not intended to replace advice given to you by your health care provider. Make sure you discuss any questions you have with your health care provider.   Document Released: 04/10/2004 Document Revised: 03/24/2014 Document Reviewed: 09/03/2012 Elsevier Interactive Patient Education [employer].

## 2015-06-18 ENCOUNTER — Telehealth: Payer: Self-pay | Admitting: *Deleted

## 2015-06-18 ENCOUNTER — Ambulatory Visit (HOSPITAL_BASED_OUTPATIENT_CLINIC_OR_DEPARTMENT_OTHER): Payer: Commercial Managed Care - HMO | Admitting: Family

## 2015-06-18 ENCOUNTER — Other Ambulatory Visit (HOSPITAL_BASED_OUTPATIENT_CLINIC_OR_DEPARTMENT_OTHER): Payer: Commercial Managed Care - HMO

## 2015-06-18 ENCOUNTER — Ambulatory Visit (HOSPITAL_BASED_OUTPATIENT_CLINIC_OR_DEPARTMENT_OTHER): Payer: Commercial Managed Care - HMO

## 2015-06-18 VITALS — BP 128/66 | HR 71 | Temp 97.5°F | Resp 16 | Ht 67.0 in | Wt 151.0 lb

## 2015-06-18 DIAGNOSIS — K922 Gastrointestinal hemorrhage, unspecified: Secondary | ICD-10-CM

## 2015-06-18 DIAGNOSIS — N189 Chronic kidney disease, unspecified: Secondary | ICD-10-CM

## 2015-06-18 DIAGNOSIS — D631 Anemia in chronic kidney disease: Secondary | ICD-10-CM

## 2015-06-18 DIAGNOSIS — D649 Anemia, unspecified: Secondary | ICD-10-CM

## 2015-06-18 DIAGNOSIS — K219 Gastro-esophageal reflux disease without esophagitis: Secondary | ICD-10-CM

## 2015-06-18 DIAGNOSIS — D500 Iron deficiency anemia secondary to blood loss (chronic): Secondary | ICD-10-CM

## 2015-06-18 DIAGNOSIS — R002 Palpitations: Secondary | ICD-10-CM

## 2015-06-18 DIAGNOSIS — E119 Type 2 diabetes mellitus without complications: Secondary | ICD-10-CM

## 2015-06-18 DIAGNOSIS — N289 Disorder of kidney and ureter, unspecified: Secondary | ICD-10-CM | POA: Diagnosis not present

## 2015-06-18 DIAGNOSIS — D509 Iron deficiency anemia, unspecified: Secondary | ICD-10-CM

## 2015-06-18 DIAGNOSIS — R5383 Other fatigue: Secondary | ICD-10-CM

## 2015-06-18 DIAGNOSIS — K254 Chronic or unspecified gastric ulcer with hemorrhage: Secondary | ICD-10-CM

## 2015-06-18 LAB — COMPREHENSIVE METABOLIC PANEL
ALT: 12 U/L (ref 0–55)
AST: 14 U/L (ref 5–34)
Albumin: 2.1 g/dL — ABNORMAL LOW (ref 3.5–5.0)
Alkaline Phosphatase: 112 U/L (ref 40–150)
Anion Gap: 7 mEq/L (ref 3–11)
BUN: 42 mg/dL — ABNORMAL HIGH (ref 7.0–26.0)
CO2: 20 meq/L — AB (ref 22–29)
Calcium: 8.2 mg/dL — ABNORMAL LOW (ref 8.4–10.4)
Chloride: 111 mEq/L — ABNORMAL HIGH (ref 98–109)
Creatinine: 6.1 mg/dL (ref 0.7–1.3)
EGFR: 10 mL/min/{1.73_m2} — AB (ref >90)
GLUCOSE: 190 mg/dL — AB (ref 70–140)
POTASSIUM: 3.6 meq/L (ref 3.5–5.1)
SODIUM: 139 meq/L (ref 136–145)
TOTAL PROTEIN: 6 g/dL — AB (ref 6.4–8.3)
Total Bilirubin: <0.3 mg/dL (ref 0.20–1.20)

## 2015-06-18 LAB — CBC WITH DIFFERENTIAL (CANCER CENTER ONLY)
BASO#: 0.1 10*3/uL (ref 0.0–0.2)
BASO%: 0.6 % (ref 0.0–2.0)
EOS ABS: 0.2 10*3/uL (ref 0.0–0.5)
EOS%: 2.5 % (ref 0.0–7.0)
HCT: 22.5 % — ABNORMAL LOW (ref 38.7–49.9)
HGB: 7.9 g/dL — ABNORMAL LOW (ref 13.0–17.1)
LYMPH#: 0.9 10*3/uL (ref 0.9–3.3)
LYMPH%: 9.7 % — AB (ref 14.0–48.0)
MCH: 31.7 pg (ref 28.0–33.4)
MCHC: 35.1 g/dL (ref 32.0–35.9)
MCV: 90 fL (ref 82–98)
MONO#: 0.7 10*3/uL (ref 0.1–0.9)
MONO%: 7.4 % (ref 0.0–13.0)
NEUT#: 7.2 10*3/uL — ABNORMAL HIGH (ref 1.5–6.5)
NEUT%: 79.8 % (ref 40.0–80.0)
PLATELETS: 321 10*3/uL (ref 145–400)
RBC: 2.49 10*6/uL — AB (ref 4.20–5.70)
RDW: 14.9 % (ref 11.1–15.7)
WBC: 9 10*3/uL (ref 4.0–10.0)

## 2015-06-18 LAB — IRON AND TIBC
%SAT: 19 % — ABNORMAL LOW (ref 20–55)
Iron: 37 ug/dL — ABNORMAL LOW (ref 42–163)
TIBC: 189 ug/dL — AB (ref 202–409)
UIBC: 152 ug/dL (ref 117–376)

## 2015-06-18 LAB — FERRITIN: FERRITIN: 161 ng/mL (ref 22–316)

## 2015-06-18 LAB — CHCC SATELLITE - SMEAR

## 2015-06-18 MED ORDER — FERUMOXYTOL INJECTION 510 MG/17 ML
510.0000 mg | Freq: Once | INTRAVENOUS | Status: AC
  Administered 2015-06-18: 510 mg via INTRAVENOUS
  Filled 2015-06-18: qty 17

## 2015-06-18 MED ORDER — SODIUM CHLORIDE 0.9 % IV SOLN
Freq: Once | INTRAVENOUS | Status: AC
  Administered 2015-06-18: 11:00:00 via INTRAVENOUS

## 2015-06-18 NOTE — Progress Notes (Signed)
Hematology and Oncology Follow Up Visit  [name] [id number] 1946-01-26 70 y.o. 06/18/2015   Principle Diagnosis:  1. Iron-deficiency anemia secondary to recurrent gastrointestinal bleeding. 2. 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  Current Therapy:   1. IV iron as indicated 2. Supportive blood transfusion as needed     Interim History:  [name] is here today for a follow-up. He continues to feel fatigued and has occasional palpitations. He is still having dark tarry stools. His most recent colonoscopy showed multiple AVM's. His Hgb at this time is 7.9 with an MCV of 90.  His blood sugars have been in the 140's. He states that he has stopped taking his insulin because it was making him dizzy. He has not yet followed up with his PCP regarding this decision.  [name] He is making urine and has no swelling in his extremities at this time.  He is trying to eat well and staying hydrated. His weight is down 5 lbs since his last visit.  No fever, chills, n/v, cough, rash, chest pain, palpitations, abdominal pain or changes in bowel or bladder habits. He has some numbness and tingling in his feet which is unchanged.  Of note: Patient no longer receiving Aranesp due to nonhemorrhagic stroke  Medications:    Medication List       This list is accurate as of: 06/18/15 10:34 AM.  Always use your most recent med list.               amLODipine 5 MG tablet  Commonly known as:  NORVASC  Take 5 mg by mouth daily.     aspirin 325 MG tablet  Take 1 tablet (325 mg total) by mouth daily.     atorvastatin 80 MG tablet  Commonly known as:  LIPITOR  Take 1 tablet (80 mg total) by mouth daily at 6 PM.     carvedilol 12.5 MG tablet  Commonly known as:  COREG  Take 1 tablet (12.5 mg total) by mouth 2 (two) times daily with a meal.     diltiazem 120 MG 24 hr capsule  Commonly known as:  DILACOR XR  Take 120 mg by  mouth daily.     fluticasone 50 MCG/ACT nasal spray  Commonly known as:  FLONASE  Place 2 sprays into both nostrils daily as needed (congestion).     gabapentin 300 MG capsule  Commonly known as:  NEURONTIN  Take 2 capsules (600 mg total) by mouth at bedtime.     insulin glargine 100 unit/mL Sopn  Commonly known as:  LANTUS  Inject 25 Units into the skin at bedtime.     insulin lispro 100 UNIT/ML injection  Commonly known as:  HUMALOG  USE AS ADVISED - MAXIMUM 25 UNITS PER DAY. INJECT 7-7-5 UNITS PLUS SSI AS ADVISED.     Insulin Pen Needle 31G X 8 MM Misc  Commonly known as:  B-D ULTRAFINE III SHORT PEN  USE AS DIRECTED WITH INSULIN Dx: 250.02     lisinopril 20 MG tablet  Commonly known as:  PRINIVIL,ZESTRIL  Take 20 mg by mouth daily.     multivitamin with minerals Tabs tablet  Take 1 tablet by mouth daily.     nitroGLYCERIN 0.4 MG SL tablet  Commonly known as:  NITROSTAT  Place 1 tablet (0.4 mg total) under the tongue every 5 (five) minutes as needed for chest pain (up to 3 doses).  omeprazole 40 MG capsule  Commonly known as:  PRILOSEC  Take 1 capsule (40 mg total) by mouth daily.     Vitamin D (Ergocalciferol) 50000 units Caps capsule  Commonly known as:  DRISDOL  Take 50,000 Units by mouth every 7 (seven) days. On Mondays        Allergies: No Known Allergies  Past Medical History, Surgical history, Social history, and Family History were reviewed and updated.  Review of Systems: All other 10 point review of systems is negative.   Physical Exam:  height is 5\' 7"  (1.702 m) and weight is 151 lb (68.493 kg). His oral temperature is 97.5 F (36.4 C). His blood pressure is 128/66 and his pulse is 71. His respiration is 16.   Wt Readings from Last 3 Encounters:  06/18/15 151 lb (68.493 kg)  06/13/15 156 lb (70.761 kg)  05/07/15 158 lb (71.668 kg)    Ocular: Sclerae unicteric, pupils equal, round and reactive to light Ear-nose-throat: Oropharynx clear,  dentition fair Lymphatic: No cervical supraclavicular or axillary adenopathy Lungs no rales or rhonchi, good excursion bilaterally Heart regular rate and rhythm, no murmur appreciated Abd soft, nontender, positive bowel sounds, no liver or spleen tip palpated on exam, no fluid wave MSK no focal spinal tenderness, no joint edema Neuro: non-focal, well-oriented, appropriate affect  Lab Results  Component Value Date   WBC 9.0 06/18/2015   HGB 7.9* 06/18/2015   HCT 22.5* 06/18/2015   MCV 90 06/18/2015   PLT 321 06/18/2015   Lab Results  Component Value Date   FERRITIN 30 04/17/2015   IRON 51 04/17/2015   TIBC 259 04/17/2015   UIBC 207 04/17/2015   IRONPCTSAT 20 04/17/2015   Lab Results  Component Value Date   RETICCTPCT 3.0* 03/15/2015   RBC 2.49* 06/18/2015   RETICCTABS 76.5 03/15/2015   No results found for: KPAFRELGTCHN, LAMBDASER, KAPLAMBRATIO No results found for: IGGSERUM, IGA, IGMSERUM No results found for: Odetta Pink, SPEI   Chemistry      Component Value Date/Time   NA 143 04/17/2015 0852   NA 140 04/07/2015 0415   NA 139 07/27/2014 0821   K 5.5* 04/17/2015 0852   K 4.5 04/07/2015 0415   K 4.0 07/27/2014 0821   CL 111 04/07/2015 0415   CL 106 07/27/2014 0821   CO2 18* 04/17/2015 0852   CO2 21* 04/07/2015 0415   CO2 26 07/27/2014 0821   BUN 43.0* 04/17/2015 0852   BUN 33* 04/07/2015 0415   BUN 18 07/27/2014 0821   CREATININE 4.8* 04/17/2015 0852   CREATININE 3.54* 04/07/2015 0415   CREATININE 2.1* 07/27/2014 0821      Component Value Date/Time   CALCIUM 8.9 04/17/2015 0852   CALCIUM 8.2* 04/07/2015 0415   CALCIUM 9.2 07/27/2014 0821   ALKPHOS 133 04/17/2015 0852   ALKPHOS 120 04/03/2015 1040   ALKPHOS 117* 07/27/2014 0821   AST 10 04/17/2015 0852   AST 13* 04/03/2015 1040   AST 16 07/27/2014 0821   ALT 13 04/17/2015 0852   ALT 12* 04/03/2015 1040   ALT 16 07/27/2014 0821   BILITOT <0.30  04/17/2015 0852   BILITOT 0.3 04/03/2015 1040   BILITOT 0.60 07/27/2014 0821     Impression and Plan: [name] is 70 year old gentleman with multifactorial anemia (iron deficiency/renal insufficiency). He is symptomatic with fatigue, occasional palpitations and continues to have dark tarry stools. His Hgb today is down to 7.9 with an MCV of 90.  We will go ahead and give him a dose of Feraheme while he is here in the office.  His blood sugars are not well controlled and he has not been taking his insulin. His Creatinine is elevated today at 6.1. We discussed the importance that he follow up with his PCP and discuss his blood sugars and insulin regimen. He promises to call them.  We will plan to see him back in 6 weeks for labs and follow-up.  He knows to contact us with any questions or concerns. We can certainly see him sooner if need be.   Eliezer Bottom, NP 4/3/201710:34 AM

## 2015-06-18 NOTE — Telephone Encounter (Signed)
Critical Value Creatinine 6.1 Laverna Peace NP notified. No orders at this time.

## 2015-06-18 NOTE — Patient Instructions (Signed)
ferFerumoxytol injection What is this medicine? FERUMOXYTOL is an iron complex. Iron is used to make healthy red blood cells, which carry oxygen and nutrients throughout the body. This medicine is used to treat iron deficiency anemia in people with chronic kidney disease. This medicine may be used for other purposes; ask your health care provider or pharmacist if you have questions. What should I tell my health care provider before I take this medicine? They need to know if you have any of these conditions: -anemia not caused by low iron levels -high levels of iron in the blood -magnetic resonance imaging (MRI) test scheduled -an unusual or allergic reaction to iron, other medicines, foods, dyes, or preservatives -pregnant or trying to get pregnant -breast-feeding How should I use this medicine? This medicine is for injection into a vein. It is given by a health care professional in a hospital or clinic setting. Talk to your pediatrician regarding the use of this medicine in children. Special care may be needed. Overdosage: If you think you have taken too much of this medicine contact a poison control center or emergency room at once. NOTE: This medicine is only for you. Do not share this medicine with others. What if I miss a dose? It is important not to miss your dose. Call your doctor or health care professional if you are unable to keep an appointment. What may interact with this medicine? This medicine may interact with the following medications: -other iron products This list may not describe all possible interactions. Give your health care provider a list of all the medicines, herbs, non-prescription drugs, or dietary supplements you use. Also tell them if you smoke, drink alcohol, or use illegal drugs. Some items may interact with your medicine. What should I watch for while using this medicine? Visit your doctor or healthcare professional regularly. Tell your doctor or healthcare  professional if your symptoms do not start to get better or if they get worse. You may need blood work done while you are taking this medicine. You may need to follow a special diet. Talk to your doctor. Foods that contain iron include: whole grains/cereals, dried fruits, beans, or peas, leafy green vegetables, and organ meats (liver, kidney). What side effects may I notice from receiving this medicine? Side effects that you should report to your doctor or health care professional as soon as possible: -allergic reactions like skin rash, itching or hives, swelling of the face, lips, or tongue -breathing problems -changes in blood pressure -feeling faint or lightheaded, falls -fever or chills -flushing, sweating, or hot feelings -swelling of the ankles or feet Side effects that usually do not require medical attention (Report these to your doctor or health care professional if they continue or are bothersome.): -diarrhea -headache -nausea, vomiting -stomach pain This list may not describe all possible side effects. Call your doctor for medical advice about side effects. You may report side effects to FDA at 1-800-FDA-1088. Where should I keep my medicine? This drug is given in a hospital or clinic and will not be stored at home. NOTE: This sheet is a summary. It may not cover all possible information. If you have questions about this medicine, talk to your doctor, pharmacist, or health care provider.    2016, Elsevier/Gold Standard. (2011-10-17 15:23:36)

## 2015-06-19 LAB — RETICULOCYTES: RETICULOCYTE COUNT: 3.8 % — AB (ref 0.6–2.6)

## 2015-06-29 ENCOUNTER — Encounter: Payer: Self-pay | Admitting: *Deleted

## 2015-07-06 ENCOUNTER — Telehealth: Payer: Self-pay | Admitting: *Deleted

## 2015-07-06 DIAGNOSIS — D631 Anemia in chronic kidney disease: Secondary | ICD-10-CM

## 2015-07-06 DIAGNOSIS — D509 Iron deficiency anemia, unspecified: Secondary | ICD-10-CM

## 2015-07-06 DIAGNOSIS — N189 Chronic kidney disease, unspecified: Secondary | ICD-10-CM

## 2015-07-06 MED ORDER — ATORVASTATIN CALCIUM 80 MG PO TABS: 80.0000 mg | ORAL_TABLET | Freq: Every day | ORAL | Status: DC

## 2015-07-06 MED ORDER — OMEPRAZOLE 40 MG PO CPDR: 40.0000 mg | DELAYED_RELEASE_CAPSULE | Freq: Every day | ORAL | Status: DC

## 2015-07-06 NOTE — Telephone Encounter (Signed)
Received requests for omeprazole and atorvstatin. Refills sent. Pt is past due for follow up with Melissa.  Please call pt to arrange appt. Thanks!

## 2015-07-09 NOTE — Telephone Encounter (Signed)
Pt has been scheduled.  °

## 2015-07-18 ENCOUNTER — Other Ambulatory Visit: Payer: Self-pay | Admitting: Family

## 2015-07-18 ENCOUNTER — Telehealth: Payer: Self-pay | Admitting: Emergency Medicine

## 2015-07-18 ENCOUNTER — Telehealth: Payer: Self-pay | Admitting: *Deleted

## 2015-07-18 ENCOUNTER — Telehealth: Payer: Self-pay | Admitting: Family

## 2015-07-18 ENCOUNTER — Ambulatory Visit (INDEPENDENT_AMBULATORY_CARE_PROVIDER_SITE_OTHER): Payer: Medicare Other | Admitting: Family

## 2015-07-18 ENCOUNTER — Encounter: Payer: Self-pay | Admitting: Family

## 2015-07-18 VITALS — BP 140/73 | HR 72 | Temp 97.6°F | Resp 16 | Ht 67.0 in | Wt 147.6 lb

## 2015-07-18 DIAGNOSIS — D631 Anemia in chronic kidney disease: Secondary | ICD-10-CM

## 2015-07-18 DIAGNOSIS — E785 Hyperlipidemia, unspecified: Secondary | ICD-10-CM

## 2015-07-18 DIAGNOSIS — D509 Iron deficiency anemia, unspecified: Secondary | ICD-10-CM

## 2015-07-18 DIAGNOSIS — N189 Chronic kidney disease, unspecified: Secondary | ICD-10-CM

## 2015-07-18 DIAGNOSIS — I10 Essential (primary) hypertension: Secondary | ICD-10-CM

## 2015-07-18 DIAGNOSIS — B353 Tinea pedis: Secondary | ICD-10-CM

## 2015-07-18 DIAGNOSIS — IMO0001 Reserved for inherently not codable concepts without codable children: Secondary | ICD-10-CM

## 2015-07-18 DIAGNOSIS — IMO0002 Reserved for concepts with insufficient information to code with codable children: Secondary | ICD-10-CM

## 2015-07-18 DIAGNOSIS — I639 Cerebral infarction, unspecified: Secondary | ICD-10-CM | POA: Diagnosis not present

## 2015-07-18 DIAGNOSIS — E1122 Type 2 diabetes mellitus with diabetic chronic kidney disease: Secondary | ICD-10-CM

## 2015-07-18 DIAGNOSIS — D649 Anemia, unspecified: Secondary | ICD-10-CM

## 2015-07-18 DIAGNOSIS — E559 Vitamin D deficiency, unspecified: Secondary | ICD-10-CM

## 2015-07-18 DIAGNOSIS — E1165 Type 2 diabetes mellitus with hyperglycemia: Secondary | ICD-10-CM

## 2015-07-18 DIAGNOSIS — E1140 Type 2 diabetes mellitus with diabetic neuropathy, unspecified: Secondary | ICD-10-CM

## 2015-07-18 DIAGNOSIS — N184 Chronic kidney disease, stage 4 (severe): Secondary | ICD-10-CM

## 2015-07-18 DIAGNOSIS — N179 Acute kidney failure, unspecified: Secondary | ICD-10-CM

## 2015-07-18 LAB — CBC WITH DIFFERENTIAL/PLATELET
BASOS ABS: 0.1 10*3/uL (ref 0.0–0.1)
BASOS PCT: 0.7 % (ref 0.0–3.0)
EOS ABS: 0.2 10*3/uL (ref 0.0–0.7)
Eosinophils Relative: 2.6 % (ref 0.0–5.0)
Hemoglobin: 7.7 g/dL — CL (ref 13.0–17.0)
LYMPHS ABS: 0.8 10*3/uL (ref 0.7–4.0)
LYMPHS PCT: 9.5 % — AB (ref 12.0–46.0)
MCHC: 33.7 g/dL (ref 30.0–36.0)
MCV: 93.3 fl (ref 78.0–100.0)
MONO ABS: 0.8 10*3/uL (ref 0.1–1.0)
Monocytes Relative: 9.7 % (ref 3.0–12.0)
NEUTROS ABS: 6.7 10*3/uL (ref 1.4–7.7)
NEUTROS PCT: 77.5 % — AB (ref 43.0–77.0)
PLATELETS: 288 10*3/uL (ref 150.0–400.0)
RBC: 2.46 Mil/uL — ABNORMAL LOW (ref 4.22–5.81)
RDW: 15.7 % — AB (ref 11.5–15.5)
WBC: 8.6 10*3/uL (ref 4.0–10.5)

## 2015-07-18 LAB — HEMOGLOBIN A1C: HEMOGLOBIN A1C: 7.5 % — AB (ref 4.6–6.5)

## 2015-07-18 LAB — VITAMIN D 25 HYDROXY (VIT D DEFICIENCY, FRACTURES): VITD: 11.61 ng/mL — AB (ref 30.00–100.00)

## 2015-07-18 LAB — BASIC METABOLIC PANEL
BUN: 42 mg/dL — AB (ref 6–23)
CALCIUM: 8.5 mg/dL (ref 8.4–10.5)
CHLORIDE: 109 meq/L (ref 96–112)
CO2: 21 mEq/L (ref 19–32)
CREATININE: 5.61 mg/dL — AB (ref 0.40–1.50)
GFR: 13.04 mL/min — AB (ref >60.00)
Glucose, Bld: 166 mg/dL — ABNORMAL HIGH (ref 70–99)
Potassium: 4.7 mEq/L (ref 3.5–5.1)
Sodium: 139 mEq/L (ref 135–145)

## 2015-07-18 MED ORDER — INSULIN LISPRO 100 UNIT/ML ~~LOC~~ SOLN: 8.0000 [IU] | Freq: Three times a day (TID) | SUBCUTANEOUS | Status: DC | PRN

## 2015-07-18 MED ORDER — ATORVASTATIN CALCIUM 80 MG PO TABS: 80.0000 mg | ORAL_TABLET | Freq: Every day | ORAL | Status: DC

## 2015-07-18 MED ORDER — AMLODIPINE BESYLATE 5 MG PO TABS: 5.0000 mg | ORAL_TABLET | Freq: Every day | ORAL | Status: DC

## 2015-07-18 MED ORDER — OMEPRAZOLE 40 MG PO CPDR: 40.0000 mg | DELAYED_RELEASE_CAPSULE | Freq: Every day | ORAL | Status: DC

## 2015-07-18 MED ORDER — INSULIN GLARGINE 100 UNITS/ML SOLOSTAR PEN: 25.0000 [IU] | PEN_INJECTOR | Freq: Every day | SUBCUTANEOUS | Status: DC

## 2015-07-18 MED ORDER — VITAMIN D (ERGOCALCIFEROL) 1.25 MG (50000 UNIT) PO CAPS: 50000.0000 [IU] | ORAL_CAPSULE | ORAL | Status: DC

## 2015-07-18 NOTE — Telephone Encounter (Signed)
Spoke with pt's spouse. She is not aware of any active rectal bleeding for pt. She will relay below recommendation to pt and let us know if they have any questions. Also gave her # to endocrinology office to assist pt with follow up.   Received another call from [facility] with critical Creatinine 5.5 and GFR 13.3. Pt is being followed by nephrology for his kidney disease also. Please advise.

## 2015-07-18 NOTE — Assessment & Plan Note (Signed)
Advised pt re: nature of renal failure and that he will likely progress to need for HD.  Discussed importance of strict BP and DM control.

## 2015-07-18 NOTE — Progress Notes (Signed)
Subjective:    Patient ID: [name], male    DOB: 1945/10/28, 70 y.o.   MRN: [id number]  HPI  [name] is a 70 yr old male who presents today for follow up of multiple medical problems.  1) DM2- reports sugars are in the low 100's.  Reports occasional hypoglycemia but this is rare. Reports that he is only taking lantus 8 units at bedtime. If sugars are <125 or 150 he does not take the lantus.  Reports that he uses humalog 6- 8 units before meals.  Lab Results  Component Value Date   HGBA1C 7.8* 04/06/2015   HGBA1C 8.9* 01/01/2015   HGBA1C 7.7* 09/25/2014   Lab Results  Component Value Date   MICROALBUR 1089.9* 02/03/2014   LDLCALC 132* 04/06/2015   CREATININE 6.1* 06/18/2015   2) HTN- Some swelling in his ankles.   BP Readings from Last 3 Encounters:  07/18/15 140/73  06/18/15 128/66  06/13/15 137/71   3) Hyperlipidemia- He reports good compliance with lipitor, denies myalgia.  Lab Results  Component Value Date   CHOL 201* 04/06/2015   HDL 28* 04/06/2015   LDLCALC 132* 04/06/2015   LDLDIRECT 117.0 09/25/2014   TRIG 203* 04/06/2015   CHOLHDL 7.2 04/06/2015   4) Anemia- His last transfusion was about 1 month ok. Reports energy is "ok."  Denies CP or SOB.  Lab Results  Component Value Date   WBC 9.0 06/18/2015   HGB 7.9* 06/18/2015   HCT 22.5* 06/18/2015   MCV 90 06/18/2015   PLT 321 06/18/2015   5) Renal failure-  Pt's last Cr which was draw on 06/18/15 was 6.1.  The patient is followed by [facility] nephrology. His last visit was on 07/17/15.  (note is reviewed in [facility]).  Felt that his increased Cr. Likely due to progression of CKD post AKI.  He was advised to follow up with them in 2 weeks. Denies nausea.     Review of Systems    see HPI  Past Medical History  Diagnosis Date  . Hypertension   . High cholesterol   . GERD (gastroesophageal reflux disease)   . GI bleed     a. Recurrent GI bleed, tx with IV iron. b. Per heme notes - likely AVMs  04/2012 (tx with cauterization several months ago).  . Orthostatic hypotension     a. Tx with florinef.  . Hematuria     a. Urology note scan from 07/2012: cystoscopy without evidence for bladder lesion, only lateral hypertrophy of posterior urethra, bladder impression from BPH. b. Pt states he had "some tests" scheduled for later in July 2014.  . Stage III chronic kidney disease     a. Stage 3 (DM with complications ->CKD, peripheral neuropathy).  . Anemia, iron deficiency 03/24/2011    a. Recurrent GI bleed, tx with periodic iron infusions.  . 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 Sleep apnea     "had mask; couldn't sleep in it" (09/20/2012)  . Type II diabetes mellitus ([place])     a. Dx 1994, uncontrolled.   . Diabetic peripheral neuropathy ([place]) 2014    foot pain.  . Chronic lower back pain   . Fatty liver     on CT of 11/2010  . AVM (arteriovenous malformation) of duodenum, acquired     egds in 01/2012, 04/2011  . Polyp, colonic     Colonoscopy 01/2012 "benign" polyp  . CAD (coronary artery disease)     a. Cath  09/2012: moderate borderline CAD in mid LAD/small diagonal branch, mild RCA stenosis, to be managed medically   . BPH (benign prostatic hyperplasia)   . Prostate cancer ([place])   . Stroke ([place])     x 2  . Peripheral neuropathy ([place])   . 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 Smoker      Social History   Social History  . Marital Status: Married    Spouse Name: N/A  . Number of Children: 2  . Years of Education: N/A   Occupational History  . taken out of work due to back    Social History Main Topics  . Smoking status: Current Some Day Smoker -- 1.00 packs/day for 43 years    Types: Cigarettes    Start date: 04/15/1968  . Smokeless tobacco: Never Used     Comment: 07-22--2016  still smoking  . Alcohol Use: No     Comment: 09/20/2012 "Used to; stopped ~ 2009; never had problem w/it"  . Drug Use: No  .  Sexual Activity: No   Other Topics Concern  . Not on file   Social History Narrative   Regular exercise: rides horses   Caffeine use: occasionally    Past Surgical History  Procedure Laterality Date  . Shoulder open rotator cuff repair Left 1980's  . Inguinal hernia repair Right 2011  . Lumbar disc surgery  1980's  . Robot assisted laparoscopic radical prostatectomy N/A 01/19/2013    Procedure: ROBOTIC ASSISTED LAPAROSCOPIC RADICAL PROSTATECTOMY;  Surgeon: Bernestine Amass, MD;  Location: [facility];  Service: Urology;  Laterality: N/A;  . Lymphadenectomy Bilateral 01/19/2013    Procedure: LYMPHADENECTOMY;  Surgeon: Bernestine Amass, MD;  Location: [facility];  Service: Urology;  Laterality: Bilateral;  . Enteroscopy N/A 03/01/2013    Procedure: ENTEROSCOPY;  Surgeon: Inda Castle, MD;  Location: [facility];  Service: Endoscopy;  Laterality: N/A;  . Left heart catheterization with coronary angiogram N/A 09/21/2012    Procedure: LEFT HEART CATHETERIZATION WITH CORONARY ANGIOGRAM;  Surgeon: Peter M Martinique, MD;  Location: [facility] CATH LAB;  Service: Cardiovascular;  Laterality: N/A;  . Givens capsule study N/A 04/05/2015    Procedure: GIVENS CAPSULE STUDY;  Surgeon: Gatha Mayer, MD;  Location: [facility];  Service: Endoscopy;  Laterality: N/A;    Family History  Problem Relation Age of Onset  . Heart attack Brother     Died at 70  . Stroke Father     Died at 59  . Diabetes Sister   . Diabetes Mother   . Stomach cancer Brother   . Heart attack Sister     No Known Allergies  Current Outpatient Prescriptions on File Prior to Visit  Medication Sig Dispense Refill  . aspirin 325 MG tablet Take 1 tablet (325 mg total) by mouth daily. (Patient taking differently: Take 81 mg by mouth daily. ) 30 tablet 0  . carvedilol (COREG) 12.5 MG tablet Take 1 tablet (12.5 mg total) by mouth 2 (two) times daily with a meal. 60 tablet 1  . diltiazem (DILACOR XR) 120 MG 24 hr capsule Take 120 mg by mouth  daily.    . fluticasone (FLONASE) 50 MCG/ACT nasal spray Place 2 sprays into both nostrils daily as needed (congestion).   11  . gabapentin (NEURONTIN) 300 MG capsule Take 2 capsules (600 mg total) by mouth at bedtime. 30 capsule 0  . Insulin Pen Needle (B-D ULTRAFINE III SHORT PEN) 31G  X 8 MM MISC USE AS DIRECTED WITH INSULIN Dx: 250.02 100 each 5  . lisinopril (PRINIVIL,ZESTRIL) 20 MG tablet Take 20 mg by mouth daily.    . Multiple Vitamin (MULTIVITAMIN WITH MINERALS) TABS tablet Take 1 tablet by mouth daily.    . nitroGLYCERIN (NITROSTAT) 0.4 MG SL tablet Place 1 tablet (0.4 mg total) under the tongue every 5 (five) minutes as needed for chest pain (up to 3 doses). 25 tablet 2  . Vitamin D, Ergocalciferol, (DRISDOL) 50000 UNITS CAPS capsule Take 50,000 Units by mouth every 7 (seven) days. On Mondays     No current facility-administered medications on file prior to visit.    BP 140/73 mmHg  Pulse 72  Temp(Src) 97.6 F (36.4 C) (Oral)  Resp 16  Ht 5\' 7"  (1.702 m)  Wt 147 lb 9.6 oz (66.951 kg)  BMI 23.11 kg/m2  SpO2 100%    Objective:   Physical Exam Physical Exam  Constitutional: He is oriented to person, place, and time. He appears well-developed and well-nourished. No distress.  HENT:  Head: Normocephalic and atraumatic.  Neck: Normal range of motion. No thyromegaly present.  Cardiovascular: Normal rate and regular rhythm.   No murmur heard. Pulmonary/Chest: Effort normal and breath sounds normal. No respiratory distress. He has no wheezes. He has no rales. He exhibits no tenderness.  Abdominal: Soft. Bowel sounds are normal. He exhibits no distension and no mass. There is no tenderness. There is no rebound and no guarding.  Musculoskeletal: He exhibits 1+ bilateral LE edema. Skin: Skin is warm and dry. dry, flaking skin on bilateral feet Psychiatric: He has a normal mood and affect. His behavior is normal. Judgment and thought content normal.          Assessment & Plan:    Tinea pedis- advised lamisil spray or cream bid.        Assessment & Plan:

## 2015-07-18 NOTE — Assessment & Plan Note (Signed)
Having some overnight hypoglycemia, even if he holds HS lantus. Advised pt not to exceed 5 units of humalog with evening meal. Advised pt to re-establish with endocrinology as well. Will arrange.

## 2015-07-18 NOTE — Telephone Encounter (Signed)
Vitamin D level is low.  Advise patient to begin vit D 50000 units once weekly for 12 weeks, then repeat vit D level (dx Vit D deficiency).    Refill sent.

## 2015-07-18 NOTE — Telephone Encounter (Signed)
Received call from [place] at [facility] with critical hgb of 7.7.  Pt already seeing hematology and last hgb on 06/18/15 was 7.9. Had transfusion about 1 month ago.  Please advise in Melissa's absence?

## 2015-07-18 NOTE — Assessment & Plan Note (Signed)
BP stable. Continue current meds.   

## 2015-07-18 NOTE — Telephone Encounter (Signed)
See previous phone note from 07/18/15.

## 2015-07-18 NOTE — Telephone Encounter (Signed)
Hope from [facility] called Critical results, Creat. 5.5 & GFR 13.3.[name] KitchenMarland KitchenMarland KitchenKMP

## 2015-07-18 NOTE — Telephone Encounter (Signed)
Labs are baseline. Thank you.

## 2015-07-18 NOTE — Telephone Encounter (Signed)
Please call Hematology -- Dr. Marin Olp to see what their recommendations are since they are managing this patient's anemia. Please let me know their recommendations are.

## 2015-07-18 NOTE — Patient Instructions (Addendum)
Cut down dinnertime humalog to no more than 5 units.  You will be contacted about your referral to the endocrinologist. Please complete lab work prior to leaving. Follow up in 3 months.

## 2015-07-18 NOTE — Progress Notes (Signed)
Pre visit review using our clinic review tool, if applicable. No additional management support is needed unless otherwise documented below in the visit note. 

## 2015-07-18 NOTE — Assessment & Plan Note (Signed)
Obtain follow up cbc, managed by hematology.

## 2015-07-18 NOTE — Assessment & Plan Note (Signed)
Tolerating statin, obtain lipid panel.  

## 2015-07-18 NOTE — Telephone Encounter (Signed)
Spoke with Judson Roch, NP with hematology office. States this may be his normal due to his poor kidney function. Ok if pt is asymptomatic. Advise pt to call them if he feels he is needing another iron transfusion (sob, increase fatigue), go to ER if any active rectal bleeding.  Attempted to notify pt and left message on his cell voicemail to call me back today. Also left message on spouse's voicemail to call me today.

## 2015-07-20 NOTE — Telephone Encounter (Signed)
Carvedilol refill sent to pharmacy. Tamsulosin no longer on current med list. Please advise?

## 2015-07-20 NOTE — Telephone Encounter (Signed)
Left message for pt to return my call.

## 2015-07-23 NOTE — Telephone Encounter (Signed)
Left message for pt to return my call.

## 2015-07-24 ENCOUNTER — Ambulatory Visit (HOSPITAL_BASED_OUTPATIENT_CLINIC_OR_DEPARTMENT_OTHER): Payer: Medicare Other | Admitting: Family

## 2015-07-24 ENCOUNTER — Telehealth: Payer: Self-pay | Admitting: *Deleted

## 2015-07-24 ENCOUNTER — Other Ambulatory Visit (HOSPITAL_BASED_OUTPATIENT_CLINIC_OR_DEPARTMENT_OTHER): Payer: Medicare Other

## 2015-07-24 ENCOUNTER — Ambulatory Visit (HOSPITAL_BASED_OUTPATIENT_CLINIC_OR_DEPARTMENT_OTHER): Payer: Medicare Other

## 2015-07-24 ENCOUNTER — Ambulatory Visit (HOSPITAL_COMMUNITY)
Admission: RE | Admit: 2015-07-24 | Discharge: 2015-07-24 | Disposition: A | Discharge status: Home / Self Care | Payer: Medicare Other | Source: Ambulatory Visit | Attending: Hematology & Oncology | Admitting: Hematology & Oncology

## 2015-07-24 ENCOUNTER — Encounter: Payer: Self-pay | Admitting: Family

## 2015-07-24 VITALS — BP 117/61 | HR 72 | Temp 97.4°F | Resp 18 | Ht 67.0 in | Wt 148.0 lb

## 2015-07-24 DIAGNOSIS — N289 Disorder of kidney and ureter, unspecified: Secondary | ICD-10-CM | POA: Diagnosis not present

## 2015-07-24 DIAGNOSIS — D500 Iron deficiency anemia secondary to blood loss (chronic): Secondary | ICD-10-CM | POA: Diagnosis not present

## 2015-07-24 DIAGNOSIS — Q2733 Arteriovenous malformation of digestive system vessel: Secondary | ICD-10-CM

## 2015-07-24 DIAGNOSIS — E119 Type 2 diabetes mellitus without complications: Secondary | ICD-10-CM | POA: Diagnosis not present

## 2015-07-24 DIAGNOSIS — D631 Anemia in chronic kidney disease: Secondary | ICD-10-CM

## 2015-07-24 DIAGNOSIS — N189 Chronic kidney disease, unspecified: Secondary | ICD-10-CM

## 2015-07-24 DIAGNOSIS — D649 Anemia, unspecified: Secondary | ICD-10-CM

## 2015-07-24 DIAGNOSIS — D509 Iron deficiency anemia, unspecified: Secondary | ICD-10-CM | POA: Diagnosis not present

## 2015-07-24 LAB — COMPREHENSIVE METABOLIC PANEL
ALT: <9 U/L (ref 0–55)
ANION GAP: 7 meq/L (ref 3–11)
AST: 9 U/L (ref 5–34)
Albumin: 2 g/dL — ABNORMAL LOW (ref 3.5–5.0)
Alkaline Phosphatase: 92 U/L (ref 40–150)
BUN: 48.9 mg/dL — ABNORMAL HIGH (ref 7.0–26.0)
CALCIUM: 8.3 mg/dL — AB (ref 8.4–10.4)
CHLORIDE: 112 meq/L — AB (ref 98–109)
CO2: 21 meq/L — AB (ref 22–29)
Creatinine: 6 mg/dL (ref 0.7–1.3)
EGFR: 10 mL/min/{1.73_m2} — AB (ref >90)
Glucose: 212 mg/dl — ABNORMAL HIGH (ref 70–140)
Potassium: 4.4 mEq/L (ref 3.5–5.1)
Sodium: 140 mEq/L (ref 136–145)
Total Bilirubin: <0.3 mg/dL (ref 0.20–1.20)
Total Protein: 6 g/dL — ABNORMAL LOW (ref 6.4–8.3)

## 2015-07-24 LAB — FERRITIN: FERRITIN: 191 ng/mL (ref 22–316)

## 2015-07-24 LAB — CHCC SATELLITE - SMEAR

## 2015-07-24 LAB — CBC WITH DIFFERENTIAL (CANCER CENTER ONLY)
BASO#: 0.1 10*3/uL (ref 0.0–0.2)
BASO%: 0.8 % (ref 0.0–2.0)
EOS ABS: 0.2 10*3/uL (ref 0.0–0.5)
EOS%: 3.7 % (ref 0.0–7.0)
HCT: 19.6 % — ABNORMAL LOW (ref 38.7–49.9)
HGB: 6.8 g/dL — CL (ref 13.0–17.1)
LYMPH#: 0.9 10*3/uL (ref 0.9–3.3)
LYMPH%: 14.4 % (ref 14.0–48.0)
MCH: 32.2 pg (ref 28.0–33.4)
MCHC: 34.7 g/dL (ref 32.0–35.9)
MCV: 93 fL (ref 82–98)
MONO#: 0.9 10*3/uL (ref 0.1–0.9)
MONO%: 13.6 % — ABNORMAL HIGH (ref 0.0–13.0)
NEUT#: 4.4 10*3/uL (ref 1.5–6.5)
NEUT%: 67.5 % (ref 40.0–80.0)
Platelets: 280 10*3/uL (ref 145–400)
RBC: 2.11 10*6/uL — ABNORMAL LOW (ref 4.20–5.70)
RDW: 13.6 % (ref 11.1–15.7)
WBC: 6.5 10*3/uL (ref 4.0–10.0)

## 2015-07-24 LAB — IRON AND TIBC
%SAT: 28 % (ref 20–55)
Iron: 46 ug/dL (ref 42–163)
TIBC: 166 ug/dL — ABNORMAL LOW (ref 202–409)
UIBC: 120 ug/dL (ref 117–376)

## 2015-07-24 MED ORDER — SODIUM CHLORIDE 0.9 % IV SOLN
510.0000 mg | Freq: Once | INTRAVENOUS | Status: AC
  Administered 2015-07-24: 510 mg via INTRAVENOUS
  Filled 2015-07-24: qty 17

## 2015-07-24 MED ORDER — SODIUM CHLORIDE 0.9 % IV SOLN
Freq: Once | INTRAVENOUS | Status: AC
  Administered 2015-07-24: 12:00:00 via INTRAVENOUS

## 2015-07-24 NOTE — Patient Instructions (Signed)

## 2015-07-24 NOTE — Progress Notes (Signed)
Hematology and Oncology Follow Up Visit  [name] [id number] 06/11/1945 70 y.o. 07/24/2015   Principle Diagnosis:  1. 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  Current Therapy:   1. IV iron as indicated 2. Supportive blood transfusion as needed     Interim History:  [name] is here today for a follow-up. He is symptomatic today with fatigue, palpitations, chills and SOB with exertion. When he was seen last week by his PCP his Hgb was 7.7. He is now 6.8 and states that he has had dark tarry stools. He has a history of recurrent GI bleed from multiple AVM's.  His blood sugars are still uncontrolled. I am not sure how compliant he is with his insulin.  [name] He is making urine and has no swelling in his extremities at this time.  He has maintained a good appetite and is staying well hydrated. His weight is stable.  No fever, n/v, cough, rash, chest pain, abdominal pain or changes in bowel or bladder habits. He has some numbness and tingling in his feet which is unchanged. No swelling or tenderness in his extremities.   Of note: Patient no longer receiving Aranesp due to nonhemorrhagic stroke  Medications:    Medication List       This list is accurate as of: 07/24/15  3:15 PM.  Always use your most recent med list.               amLODipine 5 MG tablet  Commonly known as:  NORVASC  Take 1 tablet (5 mg total) by mouth daily.     aspirin 325 MG tablet  Take 1 tablet (325 mg total) by mouth daily.     atorvastatin 80 MG tablet  Commonly known as:  LIPITOR  Take 1 tablet (80 mg total) by mouth daily at 6 PM.     carvedilol 12.5 MG tablet  Commonly known as:  COREG  TAKE 1 TABLET (12.5 MG TOTAL) BY MOUTH 2 (TWO) TIMES DAILY WITH A MEAL.     diltiazem 120 MG 24 hr capsule  Commonly known as:  DILACOR XR  Take  120 mg by mouth daily.     fluticasone 50 MCG/ACT nasal spray  Commonly known as:  FLONASE  Place 2 sprays into both nostrils daily as needed (congestion).     gabapentin 300 MG capsule  Commonly known as:  NEURONTIN  Take 2 capsules (600 mg total) by mouth at bedtime.     insulin glargine 100 unit/mL Sopn  Commonly known as:  LANTUS  Inject 0.25 mLs (25 Units total) into the skin at bedtime.     insulin lispro 100 UNIT/ML injection  Commonly known as:  HUMALOG  Inject 0.08 mLs (8 Units total) into the skin 3 (three) times daily as needed for high blood sugar (CBG >260).     Insulin Pen Needle 31G X 8 MM Misc  Commonly known as:  B-D ULTRAFINE III SHORT PEN  USE AS DIRECTED WITH INSULIN Dx: 250.02     lisinopril 20 MG tablet  Commonly known as:  PRINIVIL,ZESTRIL  Take 20 mg by mouth daily.     multivitamin with minerals Tabs tablet  Take 1 tablet by mouth daily.     nitroGLYCERIN 0.4 MG SL tablet  Commonly known as:  NITROSTAT  Place 1 tablet (0.4 mg total) under the tongue  every 5 (five) minutes as needed for chest pain (up to 3 doses).     omeprazole 40 MG capsule  Commonly known as:  PRILOSEC  Take 1 capsule (40 mg total) by mouth daily.     tamsulosin 0.4 MG Caps capsule  Commonly known as:  FLOMAX  TAKE ONE CAPSULE AT BEDTIME     Vitamin D (Ergocalciferol) 50000 units Caps capsule  Commonly known as:  DRISDOL  Take 1 capsule (50,000 Units total) by mouth every 7 (seven) days. On Mondays        Allergies: No Known Allergies  Past Medical History, Surgical history, Social history, and Family History were reviewed and updated.  Review of Systems: All other 10 point review of systems is negative.   Physical Exam:  height is 5\' 7"  (1.702 m) and weight is 148 lb (67.132 kg). His oral temperature is 97.4 F (36.3 C). His blood pressure is 117/61 and his pulse is 72. His respiration is 18.   Wt Readings from Last 3 Encounters:  07/24/15 148 lb (67.132 kg)    07/18/15 147 lb 9.6 oz (66.951 kg)  06/18/15 151 lb (68.493 kg)    Ocular: Sclerae unicteric, pupils equal, round and reactive to light Ear-nose-throat: Oropharynx clear, dentition fair Lymphatic: No cervical supraclavicular or axillary adenopathy Lungs no rales or rhonchi, good excursion bilaterally Heart regular rate and rhythm, no murmur appreciated Abd soft, nontender, positive bowel sounds, no liver or spleen tip palpated on exam, no fluid wave MSK no focal spinal tenderness, no joint edema Neuro: non-focal, well-oriented, appropriate affect  Lab Results  Component Value Date   WBC 6.5 07/24/2015   HGB 6.8* 07/24/2015   HCT 19.6* 07/24/2015   MCV 93 07/24/2015   PLT 280 07/24/2015   Lab Results  Component Value Date   FERRITIN 161 06/18/2015   IRON 37* 06/18/2015   TIBC 189* 06/18/2015   UIBC 152 06/18/2015   IRONPCTSAT 19* 06/18/2015   Lab Results  Component Value Date   RETICCTPCT 3.0* 03/15/2015   RBC 2.11* 07/24/2015   RETICCTABS 76.5 03/15/2015   No results found for: KPAFRELGTCHN, LAMBDASER, KAPLAMBRATIO No results found for: IGGSERUM, IGA, IGMSERUM No results found for: Kathrynn Ducking, MSPIKE, SPEI   Chemistry      Component Value Date/Time   NA 139 07/18/2015 1119   NA 139 06/18/2015 0953   NA 139 07/27/2014 0821   K 4.7 07/18/2015 1119   K 3.6 06/18/2015 0953   K 4.0 07/27/2014 0821   CL 109 07/18/2015 1119   CL 106 07/27/2014 0821   CO2 21 07/18/2015 1119   CO2 20* 06/18/2015 0953   CO2 26 07/27/2014 0821   BUN 42* 07/18/2015 1119   BUN 42.0* 06/18/2015 0953   BUN 18 07/27/2014 0821   CREATININE 5.61* 07/18/2015 1119   CREATININE 6.1* 06/18/2015 0953   CREATININE 2.1* 07/27/2014 0821      Component Value Date/Time   CALCIUM 8.5 07/18/2015 1119   CALCIUM 8.2* 06/18/2015 0953   CALCIUM 9.2 07/27/2014 0821   ALKPHOS 112 06/18/2015 0953   ALKPHOS 120 04/03/2015 1040   ALKPHOS 117* 07/27/2014 0821    AST 14 06/18/2015 0953   AST 13* 04/03/2015 1040   AST 16 07/27/2014 0821   ALT 12 06/18/2015 0953   ALT 12* 04/03/2015 1040   ALT 16 07/27/2014 0821   BILITOT <0.30 06/18/2015 0953   BILITOT 0.3 04/03/2015 1040   BILITOT 0.60 07/27/2014 [id number]  Impression and Plan: [name] is 71 year old gentleman with multifactorial anemia (iron deficiency/renal insufficiency). He is symptomatic with fatigue, occasional palpitations, chills and SOB with exertion. His Hgb is down to 6.8 with an MCV of 93. We will go ahead and give him a dose of Feraheme while he is here in the office and then plan to give him 2 units of blood tomorrow.  We will then see him back in 1 month for labs and follow-up.  He knows to contact us with any questions or concerns. We can certainly see him sooner if need be.   Eliezer Bottom, NP 5/9/20173:15 PM

## 2015-07-24 NOTE — Telephone Encounter (Signed)
Critical Value HGB 6.8 Laverna Peace NP notified. No orders at this time.

## 2015-07-24 NOTE — Telephone Encounter (Signed)
Notified pt and he voices understanding. Pt has f/u with PCP on 10/19/15 and will recheck Vitamin D at that time.

## 2015-07-25 ENCOUNTER — Ambulatory Visit (HOSPITAL_BASED_OUTPATIENT_CLINIC_OR_DEPARTMENT_OTHER): Payer: Medicare Other

## 2015-07-25 VITALS — BP 179/73 | HR 69 | Temp 97.4°F | Resp 20

## 2015-07-25 DIAGNOSIS — D649 Anemia, unspecified: Secondary | ICD-10-CM | POA: Diagnosis present

## 2015-07-25 DIAGNOSIS — Q2733 Arteriovenous malformation of digestive system vessel: Secondary | ICD-10-CM

## 2015-07-25 DIAGNOSIS — D500 Iron deficiency anemia secondary to blood loss (chronic): Secondary | ICD-10-CM | POA: Diagnosis not present

## 2015-07-25 DIAGNOSIS — D631 Anemia in chronic kidney disease: Secondary | ICD-10-CM

## 2015-07-25 DIAGNOSIS — N289 Disorder of kidney and ureter, unspecified: Secondary | ICD-10-CM

## 2015-07-25 DIAGNOSIS — D509 Iron deficiency anemia, unspecified: Secondary | ICD-10-CM

## 2015-07-25 DIAGNOSIS — N189 Chronic kidney disease, unspecified: Secondary | ICD-10-CM

## 2015-07-25 LAB — RETICULOCYTES: Reticulocyte Count: 2.7 % — ABNORMAL HIGH (ref 0.6–2.6)

## 2015-07-25 LAB — PREPARE RBC (CROSSMATCH)

## 2015-07-25 MED ORDER — ACETAMINOPHEN 325 MG PO TABS
650.0000 mg | ORAL_TABLET | Freq: Once | ORAL | Status: AC
  Administered 2015-07-25: 650 mg via ORAL

## 2015-07-25 MED ORDER — ACETAMINOPHEN 325 MG PO TABS
ORAL_TABLET | ORAL | Status: AC
Start: 2015-07-25 — End: 2015-07-25
  Filled 2015-07-25: qty 2

## 2015-07-25 MED ORDER — DIPHENHYDRAMINE HCL 25 MG PO CAPS
ORAL_CAPSULE | ORAL | Status: AC
  Filled 2015-07-25: qty 1

## 2015-07-25 MED ORDER — SODIUM CHLORIDE 0.9 % IV SOLN
250.0000 mL | Freq: Once | INTRAVENOUS | Status: AC
  Administered 2015-07-25: 250 mL via INTRAVENOUS

## 2015-07-25 MED ORDER — FUROSEMIDE 10 MG/ML IJ SOLN
20.0000 mg | Freq: Once | INTRAMUSCULAR | Status: AC
  Administered 2015-07-25: 20 mg via INTRAVENOUS

## 2015-07-25 MED ORDER — DIPHENHYDRAMINE HCL 25 MG PO CAPS
25.0000 mg | ORAL_CAPSULE | Freq: Once | ORAL | Status: AC
  Administered 2015-07-25: 25 mg via ORAL

## 2015-07-25 MED ORDER — FUROSEMIDE 10 MG/ML IJ SOLN
INTRAMUSCULAR | Status: AC
  Filled 2015-07-25: qty 4

## 2015-07-25 NOTE — Patient Instructions (Signed)

## 2015-07-26 ENCOUNTER — Telehealth: Payer: Self-pay | Admitting: *Deleted

## 2015-07-26 ENCOUNTER — Encounter: Payer: Self-pay | Admitting: Hematology & Oncology

## 2015-07-26 LAB — TYPE AND SCREEN
ABO/RH(D): AB POS
ANTIBODY SCREEN: NEGATIVE
UNIT DIVISION: 0
UNIT DIVISION: 0

## 2015-07-26 NOTE — Telephone Encounter (Signed)
Completed handicap placard application mailed to pt's home address. Copy sent for scanning. JG//CMA

## 2015-08-03 ENCOUNTER — Other Ambulatory Visit: Payer: Commercial Managed Care - HMO

## 2015-08-03 ENCOUNTER — Telehealth: Payer: Self-pay | Admitting: Behavioral Health

## 2015-08-03 ENCOUNTER — Ambulatory Visit: Payer: Commercial Managed Care - HMO | Admitting: Family

## 2015-08-03 ENCOUNTER — Ambulatory Visit: Payer: Commercial Managed Care - HMO

## 2015-08-03 NOTE — Telephone Encounter (Addendum)
Confirmed that the patient has verbally agreed to enroll in the Chronic Care Management Program and reiterated that it's designed to help optimize his health and achieve monthly goals. Patient has been made aware of the fee associated with this program.  Time spent: 22.32 minutes  Last OV with labs was 07/18/15. Hemoglobin A1c 7.5% (H) & Glucose 166 (H).  Discussed with patient healthy eating and daily exercise. He reported that most days for breakfast he eats grits, eggs and sausage; lunch mainly consists of him eating out at places like [employer], in which he typically orders chicken & dumplings with white rice and for dinner he normally has a turkey/ham sandwich on wheat bread. Advised patient to avoid or eat less sugars and fried foods, limit refined grains such as white bread and rice. Aim at replacing the foods with more whole grains, brown rice, fruits & vegetables (e.g. steamed vegetables/salads). Also, try consuming more meats that are baked or grilled as opposed to them being fried. Patient voiced that he will work towards making better food selections whether he's at home or out at [employer].  In terms of exercise, the patient verbalized that he's not physically active at this time due to a past stroke, stating, "My right leg drags and it's hard for me to walk". Encouraged the patient to incorporate some exercise into his day to day routine. He voiced that he has an exercise bike at home that he can ride. A goal has been set or created for the patient to work out on his exercise bike 2 times a week for 10-15 minutes.  Dietary recommendations & exercise goals have been reviewed with the patient and he agrees with the plan.  Will follow-up with the patient in 1 month and reassess diet & exercise goals.

## 2015-08-21 ENCOUNTER — Ambulatory Visit (HOSPITAL_COMMUNITY)
Admission: RE | Admit: 2015-08-21 | Discharge: 2015-08-21 | Disposition: A | Discharge status: Home / Self Care | Payer: Medicare Other | Source: Ambulatory Visit | Attending: Hematology & Oncology | Admitting: Hematology & Oncology

## 2015-08-21 ENCOUNTER — Ambulatory Visit (HOSPITAL_BASED_OUTPATIENT_CLINIC_OR_DEPARTMENT_OTHER): Payer: Medicare Other | Admitting: Family

## 2015-08-21 ENCOUNTER — Other Ambulatory Visit (HOSPITAL_BASED_OUTPATIENT_CLINIC_OR_DEPARTMENT_OTHER): Payer: Medicare Other

## 2015-08-21 ENCOUNTER — Ambulatory Visit (HOSPITAL_BASED_OUTPATIENT_CLINIC_OR_DEPARTMENT_OTHER): Payer: Medicare Other

## 2015-08-21 ENCOUNTER — Telehealth: Payer: Self-pay | Admitting: *Deleted

## 2015-08-21 ENCOUNTER — Encounter: Payer: Self-pay | Admitting: Family

## 2015-08-21 VITALS — BP 128/65 | HR 70 | Temp 97.4°F | Resp 16 | Ht 67.0 in | Wt 151.0 lb

## 2015-08-21 DIAGNOSIS — N289 Disorder of kidney and ureter, unspecified: Secondary | ICD-10-CM | POA: Diagnosis not present

## 2015-08-21 DIAGNOSIS — D631 Anemia in chronic kidney disease: Secondary | ICD-10-CM

## 2015-08-21 DIAGNOSIS — Q2733 Arteriovenous malformation of digestive system vessel: Secondary | ICD-10-CM

## 2015-08-21 DIAGNOSIS — D509 Iron deficiency anemia, unspecified: Secondary | ICD-10-CM

## 2015-08-21 DIAGNOSIS — N189 Chronic kidney disease, unspecified: Secondary | ICD-10-CM | POA: Insufficient documentation

## 2015-08-21 DIAGNOSIS — D500 Iron deficiency anemia secondary to blood loss (chronic): Secondary | ICD-10-CM

## 2015-08-21 DIAGNOSIS — D649 Anemia, unspecified: Secondary | ICD-10-CM | POA: Diagnosis not present

## 2015-08-21 DIAGNOSIS — E119 Type 2 diabetes mellitus without complications: Secondary | ICD-10-CM | POA: Diagnosis not present

## 2015-08-21 LAB — CBC WITH DIFFERENTIAL (CANCER CENTER ONLY)
BASO#: 0.1 10*3/uL (ref 0.0–0.2)
BASO%: 1.1 % (ref 0.0–2.0)
EOS%: 5.5 % (ref 0.0–7.0)
Eosinophils Absolute: 0.3 10*3/uL (ref 0.0–0.5)
HCT: 21.6 % — ABNORMAL LOW (ref 38.7–49.9)
HEMOGLOBIN: 7.5 g/dL — AB (ref 13.0–17.1)
LYMPH#: 0.9 10*3/uL (ref 0.9–3.3)
LYMPH%: 15.3 % (ref 14.0–48.0)
MCH: 31.6 pg (ref 28.0–33.4)
MCHC: 34.7 g/dL (ref 32.0–35.9)
MCV: 91 fL (ref 82–98)
MONO#: 0.8 10*3/uL (ref 0.1–0.9)
MONO%: 12.5 % (ref 0.0–13.0)
NEUT%: 65.6 % (ref 40.0–80.0)
NEUTROS ABS: 4 10*3/uL (ref 1.5–6.5)
Platelets: 256 10*3/uL (ref 145–400)
RBC: 2.37 10*6/uL — ABNORMAL LOW (ref 4.20–5.70)
RDW: 13.8 % (ref 11.1–15.7)
WBC: 6.2 10*3/uL (ref 4.0–10.0)

## 2015-08-21 LAB — COMPREHENSIVE METABOLIC PANEL
ALBUMIN: 2.3 g/dL — AB (ref 3.5–5.0)
ALK PHOS: 104 U/L (ref 40–150)
ANION GAP: 7 meq/L (ref 3–11)
AST: 12 U/L (ref 5–34)
BUN: 46.7 mg/dL — AB (ref 7.0–26.0)
CALCIUM: 8.3 mg/dL — AB (ref 8.4–10.4)
CHLORIDE: 115 meq/L — AB (ref 98–109)
CO2: 20 mEq/L — ABNORMAL LOW (ref 22–29)
CREATININE: 6.4 mg/dL — AB (ref 0.7–1.3)
EGFR: 9 mL/min/{1.73_m2} — ABNORMAL LOW (ref >90)
Glucose: 187 mg/dl — ABNORMAL HIGH (ref 70–140)
Potassium: 4.7 mEq/L (ref 3.5–5.1)
Sodium: 142 mEq/L (ref 136–145)
TOTAL PROTEIN: 6.1 g/dL — AB (ref 6.4–8.3)

## 2015-08-21 LAB — FERRITIN: Ferritin: 345 ng/ml — ABNORMAL HIGH (ref 22–316)

## 2015-08-21 LAB — IRON AND TIBC
%SAT: 26 % (ref 20–55)
Iron: 48 ug/dL (ref 42–163)
TIBC: 181 ug/dL — AB (ref 202–409)
UIBC: 134 ug/dL (ref 117–376)

## 2015-08-21 LAB — CHCC SATELLITE - SMEAR

## 2015-08-21 MED ORDER — SODIUM CHLORIDE 0.9 % IV SOLN
Freq: Once | INTRAVENOUS | Status: AC
  Administered 2015-08-21: 11:00:00 via INTRAVENOUS

## 2015-08-21 MED ORDER — SODIUM CHLORIDE 0.9 % IV SOLN
510.0000 mg | Freq: Once | INTRAVENOUS | Status: AC
  Administered 2015-08-21: 510 mg via INTRAVENOUS
  Filled 2015-08-21: qty 17

## 2015-08-21 NOTE — Progress Notes (Signed)
Hematology and Oncology Follow Up Visit  [name] [id number] 08/24/1945 70 y.o. 08/21/2015   Principle Diagnosis:  1. 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  Current Therapy:   1. IV iron as indicated 2. Supportive blood transfusion as needed     Interim History:  [name] is here today for a follow-up. He is symptomatic at this time with fatigue, weakness and SOB with any exertion. His Hgb at this time is 7.5 with an MCV of 91. He last received blood and Feraheme in May.  He is still noticing dark tarry stools.  His blood sugars are still uncontrolled. His Hgb A1c in May was 7.5.  No fever, chills, n/v, cough, rash, dizziness, chest pain, palpitations, abdominal pain or changes in bowel or bladder habits. The neuropathy in his feet which is unchanged. No swelling or tenderness in his extremities.  His appetite comes and goes. He is staying well hydrated. His weight is stable, up 3 lbs since his last visit.   Of note: Patient no longer receiving Aranesp due to nonhemorrhagic stroke  Medications:    Medication List       This list is accurate as of: 08/21/15  9:45 AM.  Always use your most recent med list.               amLODipine 5 MG tablet  Commonly known as:  NORVASC  Take 1 tablet (5 mg total) by mouth daily.     aspirin 325 MG tablet  Take 1 tablet (325 mg total) by mouth daily.     atorvastatin 80 MG tablet  Commonly known as:  LIPITOR  Take 1 tablet (80 mg total) by mouth daily at 6 PM.     carvedilol 12.5 MG tablet  Commonly known as:  COREG  TAKE 1 TABLET (12.5 MG TOTAL) BY MOUTH 2 (TWO) TIMES DAILY WITH A MEAL.     diltiazem 120 MG 24 hr capsule  Commonly known as:  DILACOR XR  Take 120 mg by mouth daily.     fluticasone 50 MCG/ACT nasal spray  Commonly known as:  FLONASE  Place 2 sprays into both nostrils daily as needed  (congestion).     gabapentin 300 MG capsule  Commonly known as:  NEURONTIN  Take 2 capsules (600 mg total) by mouth at bedtime.     insulin glargine 100 unit/mL Sopn  Commonly known as:  LANTUS  Inject 0.25 mLs (25 Units total) into the skin at bedtime.     insulin lispro 100 UNIT/ML injection  Commonly known as:  HUMALOG  Inject 0.08 mLs (8 Units total) into the skin 3 (three) times daily as needed for high blood sugar (CBG >260).     Insulin Pen Needle 31G X 8 MM Misc  Commonly known as:  B-D ULTRAFINE III SHORT PEN  USE AS DIRECTED WITH INSULIN Dx: 250.02     lisinopril 20 MG tablet  Commonly known as:  PRINIVIL,ZESTRIL  Take 20 mg by mouth daily.     multivitamin with minerals Tabs tablet  Take 1 tablet by mouth daily.     nitroGLYCERIN 0.4 MG SL tablet  Commonly known as:  NITROSTAT  Place 1 tablet (0.4 mg total) under the tongue every 5 (five) minutes as needed for chest pain (up to 3 doses).     omeprazole 40 MG capsule  Commonly known as:  PRILOSEC  Take 1  capsule (40 mg total) by mouth daily.     tamsulosin 0.4 MG Caps capsule  Commonly known as:  FLOMAX  TAKE ONE CAPSULE AT BEDTIME     Vitamin D (Ergocalciferol) 50000 units Caps capsule  Commonly known as:  DRISDOL  Take 1 capsule (50,000 Units total) by mouth every 7 (seven) days. On Mondays        Allergies: No Known Allergies  Past Medical History, Surgical history, Social history, and Family History were reviewed and updated.  Review of Systems: All other 10 point review of systems is negative.   Physical Exam:  vitals were not taken for this visit.  Wt Readings from Last 3 Encounters:  07/24/15 148 lb (67.132 kg)  07/18/15 147 lb 9.6 oz (66.951 kg)  06/18/15 151 lb (68.493 kg)    Ocular: Sclerae unicteric, pupils equal, round and reactive to light Ear-nose-throat: Oropharynx clear, dentition fair Lymphatic: No cervical supraclavicular or axillary adenopathy Lungs no rales or rhonchi, good  excursion bilaterally Heart regular rate and rhythm, no murmur appreciated Abd soft, nontender, positive bowel sounds, no liver or spleen tip palpated on exam, no fluid wave MSK no focal spinal tenderness, no joint edema Neuro: non-focal, well-oriented, appropriate affect  Lab Results  Component Value Date   WBC 6.2 08/21/2015   HGB 7.5* 08/21/2015   HCT 21.6* 08/21/2015   MCV 91 08/21/2015   PLT 256 08/21/2015   Lab Results  Component Value Date   FERRITIN 191 07/24/2015   IRON 46 07/24/2015   TIBC 166* 07/24/2015   UIBC 120 07/24/2015   IRONPCTSAT 28 07/24/2015   Lab Results  Component Value Date   RETICCTPCT 3.0* 03/15/2015   RBC 2.37* 08/21/2015   RETICCTABS 76.5 03/15/2015   No results found for: KPAFRELGTCHN, LAMBDASER, KAPLAMBRATIO No results found for: IGGSERUM, IGA, IGMSERUM No results found for: Odetta Pink, SPEI   Chemistry      Component Value Date/Time   NA 140 07/24/2015 1009   NA 139 07/18/2015 1119   NA 139 07/27/2014 0821   K 4.4 07/24/2015 1009   K 4.7 07/18/2015 1119   K 4.0 07/27/2014 0821   CL 109 07/18/2015 1119   CL 106 07/27/2014 0821   CO2 21* 07/24/2015 1009   CO2 21 07/18/2015 1119   CO2 26 07/27/2014 0821   BUN 48.9* 07/24/2015 1009   BUN 42* 07/18/2015 1119   BUN 18 07/27/2014 0821   CREATININE 6.0 Repeated and Verified* 07/24/2015 1009   CREATININE 5.61* 07/18/2015 1119   CREATININE 2.1* 07/27/2014 0821      Component Value Date/Time   CALCIUM 8.3* 07/24/2015 1009   CALCIUM 8.5 07/18/2015 1119   CALCIUM 9.2 07/27/2014 0821   ALKPHOS 92 07/24/2015 1009   ALKPHOS 120 04/03/2015 1040   ALKPHOS 117* 07/27/2014 0821   AST 9 07/24/2015 1009   AST 13* 04/03/2015 1040   AST 16 07/27/2014 0821   ALT <9 07/24/2015 1009   ALT 12* 04/03/2015 1040   ALT 16 07/27/2014 0821   BILITOT <0.30 07/24/2015 1009   BILITOT 0.3 04/03/2015 1040   BILITOT 0.60 07/27/2014 0821      Impression and Plan: [name] is 70 year old gentleman with multifactorial anemia (iron deficiency due to chronic GI bleed - AVM's/renal insufficiency due to uncontrolled diabetes). He is symptomatic with fatigue, weakness and SOB with exertion. His Hgb is 7.5 with an MCV of 91.  He will get Feraheme while he is here in  the office today and then 2 units of blood on Friday.  We will then plan to see him back in 4 weeks for labs and follow-up.  He will contact our office with any questions or concerns. We can certainly see him sooner if need be.   Eliezer Bottom, NP 6/6/20179:45 AM

## 2015-08-21 NOTE — Patient Instructions (Signed)

## 2015-08-21 NOTE — Telephone Encounter (Signed)
Critical Value Creatinine 6.4 Sarah [place] NP notified. No orders at this time 

## 2015-08-22 LAB — RETICULOCYTES: Reticulocyte Count: 2.6 % (ref 0.6–2.6)

## 2015-08-23 ENCOUNTER — Ambulatory Visit (HOSPITAL_BASED_OUTPATIENT_CLINIC_OR_DEPARTMENT_OTHER): Payer: Medicare Other

## 2015-08-23 VITALS — BP 158/76 | HR 75 | Temp 97.5°F | Resp 18

## 2015-08-23 DIAGNOSIS — N289 Disorder of kidney and ureter, unspecified: Secondary | ICD-10-CM | POA: Diagnosis present

## 2015-08-23 DIAGNOSIS — D509 Iron deficiency anemia, unspecified: Secondary | ICD-10-CM

## 2015-08-23 DIAGNOSIS — D649 Anemia, unspecified: Secondary | ICD-10-CM | POA: Diagnosis not present

## 2015-08-23 DIAGNOSIS — D500 Iron deficiency anemia secondary to blood loss (chronic): Secondary | ICD-10-CM | POA: Diagnosis not present

## 2015-08-23 DIAGNOSIS — Q2733 Arteriovenous malformation of digestive system vessel: Secondary | ICD-10-CM | POA: Diagnosis not present

## 2015-08-23 DIAGNOSIS — N189 Chronic kidney disease, unspecified: Secondary | ICD-10-CM

## 2015-08-23 DIAGNOSIS — D631 Anemia in chronic kidney disease: Secondary | ICD-10-CM

## 2015-08-23 MED ORDER — FUROSEMIDE 10 MG/ML IJ SOLN
20.0000 mg | Freq: Once | INTRAMUSCULAR | Status: AC
  Administered 2015-08-23: 20 mg via INTRAVENOUS

## 2015-08-23 MED ORDER — ACETAMINOPHEN 325 MG PO TABS
ORAL_TABLET | ORAL | Status: AC
  Filled 2015-08-23: qty 2

## 2015-08-23 MED ORDER — DIPHENHYDRAMINE HCL 25 MG PO CAPS
25.0000 mg | ORAL_CAPSULE | Freq: Once | ORAL | Status: AC
  Administered 2015-08-23: 25 mg via ORAL

## 2015-08-23 MED ORDER — SODIUM CHLORIDE 0.9% FLUSH
10.0000 mL | INTRAVENOUS | Status: DC | PRN
  Filled 2015-08-23: qty 10

## 2015-08-23 MED ORDER — SODIUM CHLORIDE 0.9 % IV SOLN
250.0000 mL | Freq: Once | INTRAVENOUS | Status: AC
  Administered 2015-08-23: 250 mL via INTRAVENOUS

## 2015-08-23 MED ORDER — ACETAMINOPHEN 325 MG PO TABS
650.0000 mg | ORAL_TABLET | Freq: Once | ORAL | Status: AC
  Administered 2015-08-23: 650 mg via ORAL

## 2015-08-23 MED ORDER — FUROSEMIDE 10 MG/ML IJ SOLN
INTRAMUSCULAR | Status: AC
  Filled 2015-08-23: qty 4

## 2015-08-23 MED ORDER — DIPHENHYDRAMINE HCL 25 MG PO CAPS
ORAL_CAPSULE | ORAL | Status: AC
  Filled 2015-08-23: qty 1

## 2015-08-23 NOTE — Patient Instructions (Signed)

## 2015-08-24 LAB — PREPARE RBC (CROSSMATCH)

## 2015-08-24 LAB — TYPE AND SCREEN
ABO/RH(D): AB POS
ANTIBODY SCREEN: NEGATIVE
UNIT DIVISION: 0
UNIT DIVISION: 0

## 2015-08-31 ENCOUNTER — Encounter: Payer: Self-pay | Admitting: Hematology & Oncology

## 2015-09-06 ENCOUNTER — Ambulatory Visit: Payer: Self-pay | Admitting: Behavioral Health

## 2015-09-06 DIAGNOSIS — E1122 Type 2 diabetes mellitus with diabetic chronic kidney disease: Secondary | ICD-10-CM

## 2015-09-06 DIAGNOSIS — IMO0001 Reserved for inherently not codable concepts without codable children: Secondary | ICD-10-CM

## 2015-09-06 DIAGNOSIS — IMO0002 Reserved for concepts with insufficient information to code with codable children: Secondary | ICD-10-CM

## 2015-09-06 DIAGNOSIS — E1140 Type 2 diabetes mellitus with diabetic neuropathy, unspecified: Secondary | ICD-10-CM

## 2015-09-06 DIAGNOSIS — E1165 Type 2 diabetes mellitus with hyperglycemia: Principal | ICD-10-CM

## 2015-09-06 NOTE — Progress Notes (Signed)
Last OV: 07/18/15  Next appt: 10/19/15 w/ [name], NP at 10:30 AM   Medications reviewed/reconciled: yes Medication adherence: yes Side effects/intolerances reviewed: yes, patient did not report any new side effects or intolerances Refills needed: no Relevant labs reviewed: yes, reviewed with the patient his last hemoglobin A1c & glucose  Care Teams:   Patient Care Team: [name], NP as PCP - General (Internal Medicine) Eliezer Bottom, NP as Nurse Practitioner (Nurse Practitioner)  Patient Active Problem List   Diagnosis Date Noted  . Acute CVA (cerebrovascular accident) ([place]) 04/06/2015  . 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 Malnutrition of moderate degree 04/05/2015  . Acute ischemic stroke ([place]) 04/05/2015  . Melena 04/05/2015  . Anemia due to chronic blood loss 04/04/2015  . Acute renal failure superimposed on stage 4 chronic kidney disease ([place]) 04/04/2015  . Symptomatic anemia 04/03/2015  . Chronic kidney disease (CKD), stage IV (severe) ([place]) 04/03/2015  . Anemia 04/03/2015  . Benign paroxysmal positional vertigo 09/26/2014  . Right leg weakness 02/28/2014  . Knee pain, right 02/13/2014  . Pain in lower limb 09/13/2013  . Metatarsalgia of both feet 09/13/2013  . Equinus deformity of foot, acquired 09/13/2013  . Renal insufficiency 09/11/2013  . Leg pain, right 09/11/2013  . BP (high blood pressure) 04/27/2013  . Diabetes mellitus ([place]) 04/27/2013  . GERD (gastroesophageal reflux disease) 03/24/2013  . GI bleed 02/28/2013  . CVA (cerebral infarction) 11/17/2012  . Dizziness and giddiness 10/31/2012  . Orthostatic hypotension 10/31/2012  . Prostate cancer ([place]) 10/27/2012  . Diabetic neuropathy ([place]) 10/04/2012  . CAD (coronary artery disease) 10/04/2012  . Hoarseness 06/26/2012  . Hematuria 05/24/2012  . Tinea pedis 05/24/2012  . Back pain 02/23/2012  . Erectile dysfunction 02/03/2012  . Type 2 diabetes, uncontrolled, with  neuropathy ([place]) 02/01/2012  . Essential hypertension 02/01/2012  . Tobacco use 02/01/2012  . Hyperlipidemia 02/01/2012  . 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 Anemia, iron deficiency 03/24/2011     Health Maintenance: Per chart review, patient's last foot exam was completed on 07/18/15 with PCP during office visit.  Health Logs: Patient voiced that he does not keep any health logs, but routinely checks his blood sugar three times a day.  Goals Reviewed: yes Goals    . Decrease soda or juice intake     Patient reports that in the last month he's reduced his intake to only 1-2 soft drinks a week.    . Eat more fruits and vegetables     Patient verbalizes consuming more green vegetables (e.g. okra, cucumbers, etc.) and lots of cantaloupe & grapes.    . Increase lean proteins     Patient reports eating a lot of baked chicken within the last month.    . Increase physical activity     Patient will walk for at least 30 minutes three times a week.    . Increase water intake     Patient will drink 5-6 glasses of water each day.    . Reduce sugar intake to X grams per day     Patient voiced that he's using Splenda as a sugar substitute; also consuming more whole grains (e.g. wheat bread (once a day with deli sandwich)), as opposed to refined grains.       Progress towards goals: Patient met all goals, except the exercise recommendation. Barriers Identified: yes, patient voiced that he has been working a lot here lately and was not able to meet last exercise goal,  but will work towards new goal of walking three times a week for at least 30 minutes.  Any patient concerns?: no  Plan:  Continue maintaining diet goals.  Increase physical activity; walk for at least 30 minutes three times a week; join the [facility] for additional exercise programs.  Increase water consumption; 5-6 glasses each day.  Keep follow-up appointments with Laverna Peace, NP (Oncology) on 09/20/15 &  [name], NP (PCP) on 10/19/15.   Time Spent: 32 minutes  Eduard Roux, RN

## 2015-09-13 NOTE — Patient Instructions (Signed)
Plan:  Continue maintaining diet goals.  Increase physical activity; walk for at least 30 minutes three times a week; join the [facility] for additional exercise programs.  Increase water consumption; 5-6 glasses each day.  Keep follow-up appointments with Laverna Peace, NP (Oncology) on 09/20/15 & [name], NP (PCP) on 10/19/15.  Diabetes and Exercise Exercising regularly is important. It is not just about losing weight. It has many health benefits, such as:  Improving your overall fitness, flexibility, and endurance.  Increasing your bone density.  Helping with weight control.  Decreasing your body fat.  Increasing your muscle strength.  Reducing stress and tension.  Improving your overall health. People with diabetes who exercise gain additional benefits because exercise:  Reduces appetite.  Improves the body's use of blood sugar (glucose).  Helps lower or control blood glucose.  Decreases blood pressure.  Helps control blood lipids (such as cholesterol and triglycerides).  Improves the body's use of the hormone insulin by:  Increasing the body's insulin sensitivity.  Reducing the body's insulin needs.  Decreases the risk for heart disease because exercising:  Lowers cholesterol and triglycerides levels.  Increases the levels of good cholesterol (such as high-density lipoproteins [HDL]) in the body.  Lowers blood glucose levels. YOUR ACTIVITY PLAN  Choose an activity that you enjoy, and set realistic goals. To exercise safely, you should begin practicing any new physical activity slowly, and gradually increase the intensity of the exercise over time. Your health care provider or diabetes educator can help create an activity plan that works for you. General recommendations include:  Encouraging children to engage in at least 60 minutes of physical activity each day.  Stretching and performing strength training exercises, such as yoga or weight  lifting, at least 2 times per week.  Performing a total of at least 150 minutes of moderate-intensity exercise each week, such as brisk walking or water aerobics.  Exercising at least 3 days per week, making sure you allow no more than 2 consecutive days to pass without exercising.  Avoiding long periods of inactivity (90 minutes or more). When you have to spend an extended period of time sitting down, take frequent breaks to walk or stretch. RECOMMENDATIONS FOR EXERCISING WITH TYPE 1 OR TYPE 2 DIABETES   Check your blood glucose before exercising. If blood glucose levels are greater than 240 mg/dL, check for urine ketones. Do not exercise if ketones are present.  Avoid injecting insulin into areas of the body that are going to be exercised. For example, avoid injecting insulin into:  The arms when playing tennis.  The legs when jogging.  Keep a record of:  Food intake before and after you exercise.  Expected peak times of insulin action.  Blood glucose levels before and after you exercise.  The type and amount of exercise you have done.  Review your records with your health care provider. Your health care provider will help you to develop guidelines for adjusting food intake and insulin amounts before and after exercising.  If you take insulin or oral hypoglycemic agents, watch for signs and symptoms of hypoglycemia. They include:  Dizziness.  Shaking.  Sweating.  Chills.  Confusion.  Drink plenty of water while you exercise to prevent dehydration or heat stroke. Body water is lost during exercise and must be replaced.  Talk to your health care provider before starting an exercise program to make sure it is safe for you. Remember, almost any type of activity is better than none.  This information is not intended to replace advice given to you by your health care provider. Make sure you discuss any questions you have with your health care provider.   Document Released:  05/24/2003 Document Revised: 07/18/2014 Document Reviewed: 08/10/2012 Elsevier Interactive Patient Education [employer].

## 2015-09-13 NOTE — Progress Notes (Signed)
Noted  

## 2015-09-20 ENCOUNTER — Ambulatory Visit: Payer: Medicare Other

## 2015-09-20 ENCOUNTER — Encounter (HOSPITAL_BASED_OUTPATIENT_CLINIC_OR_DEPARTMENT_OTHER): Payer: Self-pay

## 2015-09-20 ENCOUNTER — Other Ambulatory Visit: Payer: Self-pay

## 2015-09-20 ENCOUNTER — Other Ambulatory Visit: Payer: Self-pay | Admitting: Family

## 2015-09-20 ENCOUNTER — Other Ambulatory Visit (HOSPITAL_BASED_OUTPATIENT_CLINIC_OR_DEPARTMENT_OTHER): Payer: Medicare Other

## 2015-09-20 ENCOUNTER — Ambulatory Visit (HOSPITAL_BASED_OUTPATIENT_CLINIC_OR_DEPARTMENT_OTHER): Payer: Medicare Other | Admitting: Family

## 2015-09-20 ENCOUNTER — Telehealth: Payer: Self-pay | Admitting: *Deleted

## 2015-09-20 ENCOUNTER — Ambulatory Visit (HOSPITAL_COMMUNITY)
Admission: RE | Admit: 2015-09-20 | Discharge: 2015-09-20 | Disposition: A | Discharge status: Home / Self Care | Payer: Medicare Other | Source: Ambulatory Visit | Attending: Hematology & Oncology | Admitting: Hematology & Oncology

## 2015-09-20 ENCOUNTER — Encounter: Payer: Self-pay | Admitting: Family

## 2015-09-20 ENCOUNTER — Emergency Department (HOSPITAL_BASED_OUTPATIENT_CLINIC_OR_DEPARTMENT_OTHER)
Admission: EM | Admit: 2015-09-20 | Discharge: 2015-09-20 | Disposition: A | Discharge status: Home / Self Care | Payer: Medicare Other | Attending: Emergency Medicine | Admitting: Emergency Medicine

## 2015-09-20 ENCOUNTER — Ambulatory Visit (HOSPITAL_BASED_OUTPATIENT_CLINIC_OR_DEPARTMENT_OTHER): Payer: Medicare Other

## 2015-09-20 VITALS — BP 157/80 | HR 87 | Temp 97.9°F | Resp 20 | Ht 67.0 in | Wt 157.0 lb

## 2015-09-20 VITALS — BP 200/90 | HR 79 | Temp 97.5°F | Resp 20

## 2015-09-20 DIAGNOSIS — Z794 Long term (current) use of insulin: Secondary | ICD-10-CM | POA: Diagnosis not present

## 2015-09-20 DIAGNOSIS — E1142 Type 2 diabetes mellitus with diabetic polyneuropathy: Secondary | ICD-10-CM | POA: Diagnosis not present

## 2015-09-20 DIAGNOSIS — D500 Iron deficiency anemia secondary to blood loss (chronic): Secondary | ICD-10-CM | POA: Diagnosis present

## 2015-09-20 DIAGNOSIS — I2510 Atherosclerotic heart disease of native coronary artery without angina pectoris: Secondary | ICD-10-CM | POA: Diagnosis not present

## 2015-09-20 DIAGNOSIS — D649 Anemia, unspecified: Secondary | ICD-10-CM

## 2015-09-20 DIAGNOSIS — N289 Disorder of kidney and ureter, unspecified: Secondary | ICD-10-CM

## 2015-09-20 DIAGNOSIS — F17210 Nicotine dependence, cigarettes, uncomplicated: Secondary | ICD-10-CM | POA: Diagnosis not present

## 2015-09-20 DIAGNOSIS — K922 Gastrointestinal hemorrhage, unspecified: Secondary | ICD-10-CM

## 2015-09-20 DIAGNOSIS — N189 Chronic kidney disease, unspecified: Secondary | ICD-10-CM

## 2015-09-20 DIAGNOSIS — D631 Anemia in chronic kidney disease: Secondary | ICD-10-CM

## 2015-09-20 DIAGNOSIS — I10 Essential (primary) hypertension: Secondary | ICD-10-CM | POA: Diagnosis not present

## 2015-09-20 DIAGNOSIS — R7989 Other specified abnormal findings of blood chemistry: Secondary | ICD-10-CM | POA: Diagnosis present

## 2015-09-20 DIAGNOSIS — Z7982 Long term (current) use of aspirin: Secondary | ICD-10-CM | POA: Diagnosis not present

## 2015-09-20 DIAGNOSIS — D509 Iron deficiency anemia, unspecified: Secondary | ICD-10-CM

## 2015-09-20 DIAGNOSIS — Z8673 Personal history of transient ischemic attack (TIA), and cerebral infarction without residual deficits: Secondary | ICD-10-CM | POA: Insufficient documentation

## 2015-09-20 DIAGNOSIS — E119 Type 2 diabetes mellitus without complications: Secondary | ICD-10-CM | POA: Diagnosis not present

## 2015-09-20 DIAGNOSIS — N179 Acute kidney failure, unspecified: Secondary | ICD-10-CM | POA: Diagnosis not present

## 2015-09-20 DIAGNOSIS — Z8546 Personal history of malignant neoplasm of prostate: Secondary | ICD-10-CM | POA: Insufficient documentation

## 2015-09-20 LAB — CBC
HCT: 26.9 % — ABNORMAL LOW (ref 39.0–52.0)
HEMOGLOBIN: 9.1 g/dL — AB (ref 13.0–17.0)
MCH: 30.4 pg (ref 26.0–34.0)
MCHC: 33.8 g/dL (ref 30.0–36.0)
MCV: 90 fL (ref 78.0–100.0)
Platelets: 250 10*3/uL (ref 150–400)
RBC: 2.99 MIL/uL — AB (ref 4.22–5.81)
RDW: 14.2 % (ref 11.5–15.5)
WBC: 7.1 10*3/uL (ref 4.0–10.5)

## 2015-09-20 LAB — BASIC METABOLIC PANEL
ANION GAP: 7 (ref 5–15)
BUN: 61 mg/dL — AB (ref 6–20)
CHLORIDE: 111 mmol/L (ref 101–111)
CO2: 20 mmol/L — ABNORMAL LOW (ref 22–32)
Calcium: 8.2 mg/dL — ABNORMAL LOW (ref 8.9–10.3)
Creatinine, Ser: 8.23 mg/dL — ABNORMAL HIGH (ref 0.61–1.24)
GFR calc non Af Amer: 6 mL/min — ABNORMAL LOW (ref >60)
GFR, EST AFRICAN AMERICAN: 7 mL/min — AB (ref >60)
Glucose, Bld: 216 mg/dL — ABNORMAL HIGH (ref 65–99)
POTASSIUM: 5.3 mmol/L — AB (ref 3.5–5.1)
SODIUM: 138 mmol/L (ref 135–145)

## 2015-09-20 LAB — CBC WITH DIFFERENTIAL (CANCER CENTER ONLY)
BASO#: 0.1 10*3/uL (ref 0.0–0.2)
BASO%: 0.8 % (ref 0.0–2.0)
EOS%: 2.9 % (ref 0.0–7.0)
Eosinophils Absolute: 0.2 10*3/uL (ref 0.0–0.5)
HEMATOCRIT: 18.8 % — AB (ref 38.7–49.9)
HEMOGLOBIN: 6.5 g/dL — AB (ref 13.0–17.1)
LYMPH#: 0.8 10*3/uL — AB (ref 0.9–3.3)
LYMPH%: 10.3 % — ABNORMAL LOW (ref 14.0–48.0)
MCH: 31.6 pg (ref 28.0–33.4)
MCHC: 34.6 g/dL (ref 32.0–35.9)
MCV: 91 fL (ref 82–98)
MONO#: 1 10*3/uL — ABNORMAL HIGH (ref 0.1–0.9)
MONO%: 13.2 % — AB (ref 0.0–13.0)
NEUT%: 72.8 % (ref 40.0–80.0)
NEUTROS ABS: 5.3 10*3/uL (ref 1.5–6.5)
Platelets: 272 10*3/uL (ref 145–400)
RBC: 2.06 10*6/uL — ABNORMAL LOW (ref 4.20–5.70)
RDW: 14.3 % (ref 11.1–15.7)
WBC: 7.3 10*3/uL (ref 4.0–10.0)

## 2015-09-20 LAB — IRON AND TIBC
%SAT: 40 % (ref 20–55)
Iron: 74 ug/dL (ref 42–163)
TIBC: 186 ug/dL — ABNORMAL LOW (ref 202–409)
UIBC: 112 ug/dL — ABNORMAL LOW (ref 117–376)

## 2015-09-20 LAB — COMPREHENSIVE METABOLIC PANEL
ALBUMIN: 2.4 g/dL — AB (ref 3.5–5.0)
ALK PHOS: 102 U/L (ref 40–150)
ALT: 10 U/L (ref 0–55)
AST: 10 U/L (ref 5–34)
Anion Gap: 8 mEq/L (ref 3–11)
BUN: 60.8 mg/dL — AB (ref 7.0–26.0)
CO2: 18 meq/L — AB (ref 22–29)
Calcium: 8.3 mg/dL — ABNORMAL LOW (ref 8.4–10.4)
Chloride: 114 mEq/L — ABNORMAL HIGH (ref 98–109)
Creatinine: 8.8 mg/dL (ref 0.7–1.3)
EGFR: 6 mL/min/{1.73_m2} — ABNORMAL LOW (ref >90)
GLUCOSE: 138 mg/dL (ref 70–140)
POTASSIUM: 5.6 meq/L — AB (ref 3.5–5.1)
SODIUM: 141 meq/L (ref 136–145)
Total Bilirubin: 0.3 mg/dL (ref 0.20–1.20)
Total Protein: 6.3 g/dL — ABNORMAL LOW (ref 6.4–8.3)

## 2015-09-20 LAB — PREPARE RBC (CROSSMATCH)

## 2015-09-20 LAB — CHCC SATELLITE - SMEAR

## 2015-09-20 LAB — FERRITIN: Ferritin: 586 ng/ml — ABNORMAL HIGH (ref 22–316)

## 2015-09-20 MED ORDER — DIPHENHYDRAMINE HCL 25 MG PO CAPS
ORAL_CAPSULE | ORAL | Status: AC
  Filled 2015-09-20: qty 1

## 2015-09-20 MED ORDER — FUROSEMIDE 10 MG/ML IJ SOLN
20.0000 mg | Freq: Once | INTRAMUSCULAR | Status: AC
  Administered 2015-09-20: 20 mg via INTRAVENOUS

## 2015-09-20 MED ORDER — ACETAMINOPHEN 325 MG PO TABS
650.0000 mg | ORAL_TABLET | Freq: Once | ORAL | Status: AC
  Administered 2015-09-20: 650 mg via ORAL

## 2015-09-20 MED ORDER — SODIUM CHLORIDE 0.9 % IV SOLN
250.0000 mL | Freq: Once | INTRAVENOUS | Status: AC
  Administered 2015-09-20: 250 mL via INTRAVENOUS

## 2015-09-20 MED ORDER — DIPHENHYDRAMINE HCL 25 MG PO CAPS
25.0000 mg | ORAL_CAPSULE | Freq: Once | ORAL | Status: AC
  Administered 2015-09-20: 25 mg via ORAL

## 2015-09-20 MED ORDER — ACETAMINOPHEN 325 MG PO TABS
ORAL_TABLET | ORAL | Status: AC
  Filled 2015-09-20: qty 2

## 2015-09-20 MED ORDER — FUROSEMIDE 10 MG/ML IJ SOLN
INTRAMUSCULAR | Status: AC
  Filled 2015-09-20: qty 4

## 2015-09-20 MED ORDER — HEPARIN SOD (PORK) LOCK FLUSH 100 UNIT/ML IV SOLN
500.0000 [IU] | Freq: Every day | INTRAVENOUS | Status: DC | PRN
  Filled 2015-09-20: qty 5

## 2015-09-20 MED ORDER — SODIUM CHLORIDE 0.9 % IV SOLN
INTRAVENOUS | Status: DC
  Administered 2015-09-20: 17:00:00 via INTRAVENOUS

## 2015-09-20 NOTE — ED Notes (Signed)
Patient placed on cardiac monitor and vitals set for Q 30 mins.

## 2015-09-20 NOTE — ED Notes (Signed)
Pt was sent from cancer center in building for "kidney function was high"-NAD-steady gait

## 2015-09-20 NOTE — Patient Instructions (Signed)

## 2015-09-20 NOTE — Progress Notes (Signed)
Critical labs shown to Dr. Marin Olp, to go to ED after blood transfusion. Will cancel blood transfusion for tomorrow.

## 2015-09-20 NOTE — Progress Notes (Signed)
Hematology and Oncology Follow Up Visit  [name] [id number] [birth year] 70 y.o. 09/20/2015   Principle Diagnosis:  1. 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  Current Therapy:   1. IV iron as indicated 2. Supportive blood transfusion as needed     Interim History:  [name] is here today for a follow-up. He is symptomatic at this time with fatigue, weakness, SOB with any exertion and right sided tenderness. His Hgb is 6.5 He still has dark tarry stools. He is feeling constipated today. His last BM was yesterday afternoon. His abdomen was soft with no organomegaly on exam. He uses stool softeners as needed.  No fever, chills, n/v, cough, rash, dizziness, chest pain, palpitations or changes in bladder habits. The neuropathy in his feet which is unchanged. No swelling or tenderness in his extremities.  His appetite comes and goes. He is staying well hydrated. His weight is stable, up 6 lbs since his last visit. His blood glucose levels are still uncontrolled.   Of note: Patient no longer receiving Aranesp due to nonhemorrhagic stroke  Medications:    Medication List       This list is accurate as of: 09/20/15 10:30 AM.  Always use your most recent med list.               amLODipine 5 MG tablet  Commonly known as:  NORVASC  Take 1 tablet (5 mg total) by mouth daily.     aspirin 325 MG tablet  Take 1 tablet (325 mg total) by mouth daily.     atorvastatin 80 MG tablet  Commonly known as:  LIPITOR  Take 1 tablet (80 mg total) by mouth daily at 6 PM.     carvedilol 12.5 MG tablet  Commonly known as:  COREG  TAKE 1 TABLET (12.5 MG TOTAL) BY MOUTH 2 (TWO) TIMES DAILY WITH A MEAL.     diltiazem 120 MG 24 hr capsule  Commonly known as:  DILACOR XR  Take 120 mg by mouth daily.     fluticasone 50 MCG/ACT nasal spray  Commonly known as:  FLONASE  Place 2  sprays into both nostrils daily as needed (congestion). Reported on 09/06/2015     gabapentin 300 MG capsule  Commonly known as:  NEURONTIN  Take 2 capsules (600 mg total) by mouth at bedtime.     insulin glargine 100 unit/mL Sopn  Commonly known as:  LANTUS  Inject 0.25 mLs (25 Units total) into the skin at bedtime.     insulin lispro 100 UNIT/ML injection  Commonly known as:  HUMALOG  Inject 0.08 mLs (8 Units total) into the skin 3 (three) times daily as needed for high blood sugar (CBG >260).     Insulin Pen Needle 31G X 8 MM Misc  Commonly known as:  B-D ULTRAFINE III SHORT PEN  USE AS DIRECTED WITH INSULIN Dx: 250.02     lisinopril 20 MG tablet  Commonly known as:  PRINIVIL,ZESTRIL  Take 20 mg by mouth daily.     multivitamin with minerals Tabs tablet  Take 1 tablet by mouth daily.     nitroGLYCERIN 0.4 MG SL tablet  Commonly known as:  NITROSTAT  Place 1 tablet (0.4 mg total) under the tongue every 5 (five) minutes as needed for chest pain (up to 3 doses).     omeprazole 40 MG capsule  Commonly known as:  PRILOSEC  Take 1 capsule (40 mg total) by mouth daily.     tamsulosin 0.4 MG Caps capsule  Commonly known as:  FLOMAX  TAKE ONE CAPSULE AT BEDTIME        Allergies: No Known Allergies  Past Medical History, Surgical history, Social history, and Family History were reviewed and updated.  Review of Systems: All other 10 point review of systems is negative.   Physical Exam:  height is 5\' 7"  (1.702 m) and weight is 157 lb (71.215 kg). His oral temperature is 97.9 F (36.6 C). His blood pressure is 157/80 and his pulse is 87. His respiration is 20.   Wt Readings from Last 3 Encounters:  09/20/15 157 lb (71.215 kg)  08/21/15 151 lb (68.493 kg)  07/24/15 148 lb (67.132 kg)    Ocular: Sclerae unicteric, pupils equal, round and reactive to light Ear-nose-throat: Oropharynx clear, dentition fair Lymphatic: No cervical supraclavicular or axillary  adenopathy Lungs no rales or rhonchi, good excursion bilaterally Heart regular rate and rhythm, no murmur appreciated Abd soft, nontender, positive bowel sounds, no liver or spleen tip palpated on exam, no fluid wave MSK no focal spinal tenderness, no joint edema Neuro: non-focal, well-oriented, appropriate affect  Lab Results  Component Value Date   WBC 7.3 09/20/2015   HGB 6.5* 09/20/2015   HCT 18.8* 09/20/2015   MCV 91 09/20/2015   PLT 272 09/20/2015   Lab Results  Component Value Date   FERRITIN 345* 08/21/2015   IRON 48 08/21/2015   TIBC 181* 08/21/2015   UIBC 134 08/21/2015   IRONPCTSAT 26 08/21/2015   Lab Results  Component Value Date   RETICCTPCT 3.0* 03/15/2015   RBC 2.06* 09/20/2015   RETICCTABS 76.5 03/15/2015   No results found for: KPAFRELGTCHN, LAMBDASER, KAPLAMBRATIO No results found for: IGGSERUM, IGA, IGMSERUM No results found for: Odetta Pink, SPEI   Chemistry      Component Value Date/Time   NA 142 08/21/2015 0927   NA 139 07/18/2015 1119   NA 139 07/27/2014 0821   K 4.7 08/21/2015 0927   K 4.7 07/18/2015 1119   K 4.0 07/27/2014 0821   CL 109 07/18/2015 1119   CL 106 07/27/2014 0821   CO2 20* 08/21/2015 0927   CO2 21 07/18/2015 1119   CO2 26 07/27/2014 0821   BUN 46.7* 08/21/2015 0927   BUN 42* 07/18/2015 1119   BUN 18 07/27/2014 0821   CREATININE 6.4* 08/21/2015 0927   CREATININE 5.61* 07/18/2015 1119   CREATININE 2.1* 07/27/2014 0821      Component Value Date/Time   CALCIUM 8.3* 08/21/2015 0927   CALCIUM 8.5 07/18/2015 1119   CALCIUM 9.2 07/27/2014 0821   ALKPHOS 104 08/21/2015 0927   ALKPHOS 120 04/03/2015 1040   ALKPHOS 117* 07/27/2014 0821   AST 12 08/21/2015 0927   AST 13* 04/03/2015 1040   AST 16 07/27/2014 0821   ALT <9 08/21/2015 0927   ALT 12* 04/03/2015 1040   ALT 16 07/27/2014 0821   BILITOT <0.30 08/21/2015 0927   BILITOT 0.3 04/03/2015 1040   BILITOT 0.60  07/27/2014 0821     Impression and Plan: [name] is 70 year old gentleman with multifactorial anemia (iron deficiency due to chronic GI bleed - AVM's/renal insufficiency due to uncontrolled diabetes). He is symptomatic with fatigue, weakness and SOB with exertion. His Hgb is 6.5 with an MCV of 91.  We will give him 2 units of blood today.  We will then plan to  see him back in 3 weeks for labs and follow-up.  He will contact our office with any questions or concerns. We can certainly see him sooner if need be.   Eliezer Bottom, NP 7/6/201710:30 AM

## 2015-09-20 NOTE — ED Notes (Signed)
Dr. Rogene Houston attempting to speak with pt's nephrologist but the provider on call is not returning the calls at this time. Has been paged multiple times with no response.

## 2015-09-20 NOTE — Discharge Instructions (Signed)
Discussed with your kidney doctor. They are going to be contacting you. Probably needing to move quicker to dialysis then thought in the past. It is okay for you to go home. Recommends a low potassium diet. Okay to have your blood transfusion tomorrow I do recommend you get some Lasix with it. Return for any new or worse symptoms.

## 2015-09-20 NOTE — ED Provider Notes (Addendum)
CSN: [id number]     Arrival date & time 09/20/15  1605 History   First MD Initiated Contact with Patient 09/20/15 1608     Chief Complaint  Patient presents with  . Abnormal Lab     (Consider location/radiation/quality/duration/timing/severity/associated sxs/prior Treatment) The history is provided by the patient.  70 year old the male sent down from Dr. Kris Hartmann office he follows him for slow GI blood loss. Patient gets blood transfusions frequently. Was transfused some blood today due for more blood transfusion tomorrow. They noted based on his morning labs that his potassium was at 5.6 and his BUN and creatinine were significantly increased from his labs in June. Patient has no symptoms. Referred down for consideration for admission for hyperkalemia and renal failure.  Past Medical History  Diagnosis Date  . Hypertension   . High cholesterol   . GERD (gastroesophageal reflux disease)   . GI bleed     a. Recurrent GI bleed, tx with IV iron. b. Per heme notes - likely AVMs 04/2012 (tx with cauterization several months ago).  . Orthostatic hypotension     a. Tx with florinef.  . Hematuria     a. Urology note scan from 07/2012: cystoscopy without evidence for bladder lesion, only lateral hypertrophy of posterior urethra, bladder impression from BPH. b. Pt states he had "some tests" scheduled for later in July 2014.  . Stage III chronic kidney disease     a. Stage 3 (DM with complications ->CKD, peripheral neuropathy).  . Anemia, iron deficiency 03/24/2011    a. Recurrent GI bleed, tx with periodic iron infusions.  . 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 Sleep apnea     "had mask; couldn't sleep in it" (09/20/2012)  . Type II diabetes mellitus ([place])     a. Dx 1994, uncontrolled.   . Diabetic peripheral neuropathy ([place]) 2014    foot pain.  . Chronic lower back pain   . Fatty liver     on CT of 11/2010  . AVM (arteriovenous malformation) of duodenum, acquired     egds in 01/2012, 04/2011  .  Polyp, colonic     Colonoscopy 01/2012 "benign" polyp  . CAD (coronary artery disease)     a. Cath 09/2012: moderate borderline CAD in mid LAD/small diagonal branch, mild RCA stenosis, to be managed medically   . BPH (benign prostatic hyperplasia)   . Prostate cancer ([place])   . Stroke ([place])     x 2  . Peripheral neuropathy ([place])   . 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 Smoker    Past Surgical History  Procedure Laterality Date  . Shoulder open rotator cuff repair Left 1980's  . Inguinal hernia repair Right 2011  . Lumbar disc surgery  1980's  . Robot assisted laparoscopic radical prostatectomy N/A 01/19/2013    Procedure: ROBOTIC ASSISTED LAPAROSCOPIC RADICAL PROSTATECTOMY;  Surgeon: Bernestine Amass, MD;  Location: [facility];  Service: Urology;  Laterality: N/A;  . Lymphadenectomy Bilateral 01/19/2013    Procedure: LYMPHADENECTOMY;  Surgeon: Bernestine Amass, MD;  Location: [facility];  Service: Urology;  Laterality: Bilateral;  . Enteroscopy N/A 03/01/2013    Procedure: ENTEROSCOPY;  Surgeon: Inda Castle, MD;  Location: [place];  Service: Endoscopy;  Laterality: N/A;  . Left heart catheterization with coronary angiogram N/A 09/21/2012    Procedure: LEFT HEART CATHETERIZATION WITH CORONARY ANGIOGRAM;  Surgeon: Peter M Martinique, MD;  Location: [facility] CATH LAB;  Service: Cardiovascular;  Laterality: N/A;  . Givens capsule study N/A 04/05/2015    Procedure: GIVENS CAPSULE STUDY;  Surgeon: Gatha Mayer, MD;  Location: [place];  Service: Endoscopy;  Laterality: N/A;   Family History  Problem Relation Age of Onset  . Heart attack Brother     Died at 44  . Stroke Father     Died at 1  . Diabetes Sister   . Diabetes Mother   . Stomach cancer Brother   . Heart attack Sister    Social History  Substance Use Topics  . Smoking status: Current Some Day Smoker -- 1.00 packs/day for 43 years    Types: Cigarettes    Start date:  04/15/1968  . Smokeless tobacco: Never Used     Comment: 07-22--2016  still smoking  . Alcohol Use: No     Comment: 09/20/2012 "Used to; stopped ~ 2009; never had problem w/it"    Review of Systems  Constitutional: Negative for fever.  HENT: Negative for congestion.   Respiratory: Negative for shortness of breath.   Cardiovascular: Negative for chest pain.  Gastrointestinal: Positive for blood in stool. Negative for abdominal pain.  Genitourinary: Positive for dysuria.  Musculoskeletal: Negative for back pain.  Skin: Negative for rash.  Neurological: Negative for headaches.  Hematological: Does not bruise/bleed easily.  Psychiatric/Behavioral: Negative for confusion.      Allergies  Review of patient's allergies indicates no known allergies.  Home Medications   Prior to Admission medications   Medication Sig Start Date End Date Taking? Authorizing Provider  amLODipine (NORVASC) 5 MG tablet Take 1 tablet (5 mg total) by mouth daily. 07/18/15   [name], NP  aspirin 325 MG tablet Take 1 tablet (325 mg total) by mouth daily. Patient taking differently: Take 81 mg by mouth daily.  04/07/15   Orson Eva, MD  atorvastatin (LIPITOR) 80 MG tablet Take 1 tablet (80 mg total) by mouth daily at 6 PM. 07/18/15   [name], NP  carvedilol (COREG) 12.5 MG tablet TAKE 1 TABLET (12.5 MG TOTAL) BY MOUTH 2 (TWO) TIMES DAILY WITH A MEAL. 07/20/15   [name], NP  diltiazem (DILACOR XR) 120 MG 24 hr capsule Take 120 mg by mouth daily. 05/28/15   Historical Provider, MD  fluticasone (FLONASE) 50 MCG/ACT nasal spray Place 2 sprays into both nostrils daily as needed (congestion). Reported on 09/06/2015 09/21/14   Historical Provider, MD  gabapentin (NEURONTIN) 300 MG capsule Take 2 capsules (600 mg total) by mouth at bedtime. 04/07/15   Orson Eva, MD  insulin glargine (LANTUS) 100 unit/mL SOPN Inject 0.25 mLs (25 Units total) into the skin at bedtime. 07/18/15   [name], NP   insulin lispro (HUMALOG) 100 UNIT/ML injection Inject 0.08 mLs (8 Units total) into the skin 3 (three) times daily as needed for high blood sugar (CBG >260). 07/18/15   [name], NP  Insulin Pen Needle (B-D ULTRAFINE III SHORT PEN) 31G X 8 MM MISC USE AS DIRECTED WITH INSULIN Dx: 250.02 12/05/13   [name], NP  lisinopril (PRINIVIL,ZESTRIL) 20 MG tablet Take 20 mg by mouth daily. 05/27/15   Historical Provider, MD  Multiple Vitamin (MULTIVITAMIN WITH MINERALS) TABS tablet Take 1 tablet by mouth daily.    Historical Provider, MD  nitroGLYCERIN (NITROSTAT) 0.4 MG SL tablet Place 1 tablet (0.4 mg total) under the tongue every 5 (five) minutes as needed for chest pain (up to 3 doses). 09/22/12   Dayna N Dunn, PA-C  omeprazole (PRILOSEC) 40  MG capsule Take 1 capsule (40 mg total) by mouth daily. 07/18/15   [name], NP  tamsulosin (FLOMAX) 0.4 MG CAPS capsule TAKE ONE CAPSULE AT BEDTIME 07/20/15   [name], NP   BP 186/101 mmHg  Pulse 80  Temp(Src) 97.7 F (36.5 C) (Oral)  Resp 18  Ht 5\' 7"  (1.702 m)  Wt 71.215 kg  BMI 24.58 kg/m2  SpO2 100% Physical Exam  Constitutional: He is oriented to person, place, and time. He appears well-developed and well-nourished. No distress.  HENT:  Head: Normocephalic and atraumatic.  Mouth/Throat: Oropharynx is clear and moist.  Eyes: Pupils are equal, round, and reactive to light.  Neck: Normal range of motion. Neck supple.  Cardiovascular: Normal rate, regular rhythm and normal heart sounds.   No murmur heard. Pulmonary/Chest: Effort normal and breath sounds normal. No respiratory distress.  Abdominal: Soft. Bowel sounds are normal. There is no tenderness.  Musculoskeletal: Normal range of motion.  Neurological: He is alert and oriented to person, place, and time. No cranial nerve deficit. He exhibits normal muscle tone. Coordination normal.  Skin: Skin is warm. No rash noted.  Nursing note and vitals reviewed.   ED  Course  Procedures (including critical care time) Labs Review Labs Reviewed  CBC - Abnormal; Notable for the following:    RBC 2.99 (*)    Hemoglobin 9.1 (*)    HCT 26.9 (*)    All other components within normal limits  BASIC METABOLIC PANEL - Abnormal; Notable for the following:    Potassium 5.3 (*)    CO2 20 (*)    Glucose, Bld 216 (*)    BUN 61 (*)    Creatinine, Ser 8.23 (*)    Calcium 8.2 (*)    GFR calc non Af Amer 6 (*)    GFR calc Af Amer 7 (*)    All other components within normal limits    Imaging Review No results found. I have personally reviewed and evaluated these images and lab results as part of my medical decision-making.   EKG Interpretation None      ED ECG REPORT   Date: 09/20/2015  Rate: 80  Rhythm: normal sinus rhythm  QRS Axis: normal  Intervals: normal  ST/T Wave abnormalities: nonspecific ST/T changes  Conduction Disutrbances:none  Narrative Interpretation:   Old EKG Reviewed: none available  Wandering baseline in leads 2,3 and aVF.  I have personally reviewed the EKG tracing and agree with the computerized printout as noted.     MDM   Final diagnoses:  Renal failure (ARF), acute on chronic [facility])   Discussed with his nephrologist, Dr. Olivia Mackie, current lab results were gone over with him. He will make arrangements for starting the dialysis process but gave the okay for patient to be discharged home and have his blood transfusion done to tomorrow. He is recommending that he receive some Lasix with the transfusion like 40 mg. His office will be contacting him tomorrow. The patient currently completely asymptomatic. No signs or symptoms consistent with uremia. Repeat potassium here was improved from this morning at 5.3. EKG without any acute changes.     Fredia Sorrow, MD 09/20/15 1640  Fredia Sorrow, MD 09/20/15 [phone]

## 2015-09-20 NOTE — Progress Notes (Signed)
Patient send to ED with  24G Iv to the left forearm.

## 2015-09-20 NOTE — Telephone Encounter (Signed)
Received call from [place] in lab with Hgb 6.5.  Sarah NP and Dr. Marin Olp notified.  PAtient to get blood today.

## 2015-09-21 ENCOUNTER — Ambulatory Visit (HOSPITAL_BASED_OUTPATIENT_CLINIC_OR_DEPARTMENT_OTHER): Payer: Medicare Other

## 2015-09-21 VITALS — BP 197/91 | HR 82 | Temp 97.5°F | Resp 18

## 2015-09-21 DIAGNOSIS — D509 Iron deficiency anemia, unspecified: Secondary | ICD-10-CM

## 2015-09-21 DIAGNOSIS — D649 Anemia, unspecified: Secondary | ICD-10-CM | POA: Diagnosis not present

## 2015-09-21 DIAGNOSIS — N289 Disorder of kidney and ureter, unspecified: Secondary | ICD-10-CM

## 2015-09-21 DIAGNOSIS — D631 Anemia in chronic kidney disease: Secondary | ICD-10-CM

## 2015-09-21 DIAGNOSIS — D500 Iron deficiency anemia secondary to blood loss (chronic): Secondary | ICD-10-CM | POA: Diagnosis not present

## 2015-09-21 DIAGNOSIS — N189 Chronic kidney disease, unspecified: Secondary | ICD-10-CM

## 2015-09-21 LAB — RETICULOCYTES: Reticulocyte Count: 2.7 % — ABNORMAL HIGH (ref 0.6–2.6)

## 2015-09-21 MED ORDER — FUROSEMIDE 10 MG/ML IJ SOLN: 40.0000 mg | Freq: Once | INTRAMUSCULAR | Status: DC

## 2015-09-21 MED ORDER — SODIUM CHLORIDE 0.9 % IV SOLN
250.0000 mL | Freq: Once | INTRAVENOUS | Status: AC
  Administered 2015-09-21: 250 mL via INTRAVENOUS

## 2015-09-21 MED ORDER — FUROSEMIDE 10 MG/ML IJ SOLN
INTRAMUSCULAR | Status: AC
  Filled 2015-09-21: qty 4

## 2015-09-21 MED ORDER — FUROSEMIDE 10 MG/ML IJ SOLN
40.0000 mg | Freq: Once | INTRAMUSCULAR | Status: AC
  Administered 2015-09-21: 40 mg via INTRAVENOUS

## 2015-09-21 NOTE — Patient Instructions (Signed)

## 2015-09-24 LAB — TYPE AND SCREEN
ABO/RH(D): AB POS
ANTIBODY SCREEN: NEGATIVE
UNIT DIVISION: 0
Unit division: 0

## 2015-09-25 ENCOUNTER — Encounter: Payer: Self-pay | Admitting: Hematology & Oncology

## 2015-10-04 ENCOUNTER — Telehealth: Payer: Self-pay | Admitting: Family

## 2015-10-04 NOTE — Telephone Encounter (Signed)
Pt has been scheduled.  °

## 2015-10-04 NOTE — Telephone Encounter (Signed)
Yes please

## 2015-10-04 NOTE — Telephone Encounter (Signed)
Ginger- Surgical Nurse [place].    She called in to make PCP aware that pt has stopped taking all of his medications. Pt is scheduled for surgery on August 15.   She is suggesting that pt have a fu appt due to him not taking medication.       Should I schedule pt an appt?

## 2015-10-05 ENCOUNTER — Encounter: Payer: Self-pay | Admitting: Family

## 2015-10-08 ENCOUNTER — Encounter: Payer: Self-pay | Admitting: Family

## 2015-10-08 ENCOUNTER — Ambulatory Visit (INDEPENDENT_AMBULATORY_CARE_PROVIDER_SITE_OTHER): Payer: Medicare Other | Admitting: Family

## 2015-10-08 ENCOUNTER — Telehealth: Payer: Self-pay | Admitting: Emergency Medicine

## 2015-10-08 VITALS — BP 140/80 | HR 82 | Temp 97.5°F | Ht 67.0 in | Wt 142.0 lb

## 2015-10-08 DIAGNOSIS — IMO0002 Reserved for concepts with insufficient information to code with codable children: Secondary | ICD-10-CM

## 2015-10-08 DIAGNOSIS — E1122 Type 2 diabetes mellitus with diabetic chronic kidney disease: Secondary | ICD-10-CM

## 2015-10-08 DIAGNOSIS — N184 Chronic kidney disease, stage 4 (severe): Secondary | ICD-10-CM

## 2015-10-08 DIAGNOSIS — D631 Anemia in chronic kidney disease: Secondary | ICD-10-CM

## 2015-10-08 DIAGNOSIS — I25119 Atherosclerotic heart disease of native coronary artery with unspecified angina pectoris: Secondary | ICD-10-CM

## 2015-10-08 DIAGNOSIS — E785 Hyperlipidemia, unspecified: Secondary | ICD-10-CM

## 2015-10-08 DIAGNOSIS — Z794 Long term (current) use of insulin: Secondary | ICD-10-CM

## 2015-10-08 DIAGNOSIS — I639 Cerebral infarction, unspecified: Secondary | ICD-10-CM | POA: Diagnosis not present

## 2015-10-08 DIAGNOSIS — N189 Chronic kidney disease, unspecified: Secondary | ICD-10-CM

## 2015-10-08 DIAGNOSIS — D509 Iron deficiency anemia, unspecified: Secondary | ICD-10-CM | POA: Diagnosis not present

## 2015-10-08 DIAGNOSIS — E1165 Type 2 diabetes mellitus with hyperglycemia: Secondary | ICD-10-CM | POA: Diagnosis not present

## 2015-10-08 LAB — CBC WITH DIFFERENTIAL/PLATELET
BASOS ABS: 0 10*3/uL (ref 0.0–0.1)
Basophils Relative: 0.3 % (ref 0.0–3.0)
EOS ABS: 0.3 10*3/uL (ref 0.0–0.7)
Eosinophils Relative: 4.9 % (ref 0.0–5.0)
HCT: 26 % — ABNORMAL LOW (ref 39.0–52.0)
Hemoglobin: 8.8 g/dL — ABNORMAL LOW (ref 13.0–17.0)
LYMPHS ABS: 0.9 10*3/uL (ref 0.7–4.0)
Lymphocytes Relative: 13.7 % (ref 12.0–46.0)
MCHC: 33.7 g/dL (ref 30.0–36.0)
MCV: 89.9 fl (ref 78.0–100.0)
Monocytes Absolute: 0.7 10*3/uL (ref 0.1–1.0)
Monocytes Relative: 11.1 % (ref 3.0–12.0)
NEUTROS ABS: 4.6 10*3/uL (ref 1.4–7.7)
NEUTROS PCT: 70 % (ref 43.0–77.0)
PLATELETS: 265 10*3/uL (ref 150.0–400.0)
RBC: 2.89 Mil/uL — ABNORMAL LOW (ref 4.22–5.81)
RDW: 15.5 % (ref 11.5–15.5)
WBC: 6.6 10*3/uL (ref 4.0–10.5)

## 2015-10-08 LAB — BASIC METABOLIC PANEL
BUN: 69 mg/dL — AB (ref 6–23)
CO2: 20 meq/L (ref 19–32)
CREATININE: 9.53 mg/dL — AB (ref 0.40–1.50)
Calcium: 9 mg/dL (ref 8.4–10.5)
Chloride: 111 mEq/L (ref 96–112)
GFR: 7.07 mL/min — CL (ref >60.00)
GLUCOSE: 108 mg/dL — AB (ref 70–99)
Potassium: 6.2 mEq/L (ref 3.5–5.1)
Sodium: 137 mEq/L (ref 135–145)

## 2015-10-08 LAB — HEMOGLOBIN A1C: Hgb A1c MFr Bld: 6.2 % (ref 4.6–6.5)

## 2015-10-08 MED ORDER — ATORVASTATIN CALCIUM 80 MG PO TABS: 80.0000 mg | ORAL_TABLET | Freq: Every day | ORAL | 5 refills | Status: DC

## 2015-10-08 MED ORDER — CARVEDILOL 3.125 MG PO TABS: 3.1250 mg | ORAL_TABLET | Freq: Two times a day (BID) | ORAL | 3 refills | Status: DC

## 2015-10-08 MED ORDER — NITROGLYCERIN 0.4 MG SL SUBL: 0.4000 mg | SUBLINGUAL_TABLET | SUBLINGUAL | 2 refills | Status: DC | PRN

## 2015-10-08 NOTE — Telephone Encounter (Signed)
[id number] from [facility] called Critical Results for Potssium 6.2, Creat. 9.53 & GFR 7.07.[name] KitchenMarland KitchenKMP

## 2015-10-08 NOTE — Patient Instructions (Signed)
Please complete lab work prior to leaving. Restart Carvedilol (fill the prescription at [facility]). Restart Atorvastatin (cholesterol medication). Fill at [facility] with coupon. Refill nitroglycerine to have on hand for chest pain (Fill at [facility] with coupon). After I review your lab work I will let you know if we need to restart your insulin.

## 2015-10-08 NOTE — Assessment & Plan Note (Signed)
Patient is currently scheduled for AVG LUE.  No clinical signs of uremia today.

## 2015-10-08 NOTE — Progress Notes (Signed)
Subjective:    Patient ID: [name], male    DOB: 1946/01/24, 70 y.o.   MRN: [id number]  HPI  [name] is a 70 yr old male who presents today for follow up of multiple medical problems.    ESRD- I did receive a message from the Surgical Nurse navigator at [facility] that the patient stopped taking all of his medications and is scheduled for surgery on 10/30/15 for left arm  AVG.  Notes reviewed in Care Everywhere. He is not currently on HD but has Stage V CKD.  Pt reports good urinary output.  He denies nausea. The patient tells me that he stopped all his medications 3 months ago due to cost.  He reports good PO intake.   Also, wife sent a message that she was concerned about his legs and wondered if we might set him up for physical therapy. Pt reports some swelling in his feet.  He denies weakness or trouble walking.   DM2- Not taking his insulin due to cost. Reports that he does check his sugars and they have been stable off insulin. Last sugar reportedly 120. He has lost some weight.  Wt Readings from Last 3 Encounters:  10/08/15 142 lb (64.4 kg)  09/20/15 157 lb (71.2 kg)  09/20/15 157 lb (71.2 kg)    Lab Results  Component Value Date   HGBA1C 7.5 (H) 07/18/2015   HGBA1C 7.8 (H) 04/06/2015   HGBA1C 8.9 (H) 01/01/2015   Lab Results  Component Value Date   MICROALBUR 1,089.9 (H) 02/03/2014   LDLCALC 132 (H) 04/06/2015   CREATININE 8.23 (H) 09/20/2015   HTN- Not currently on any bp medications.  BP Readings from Last 3 Encounters:  10/08/15 140/80  09/21/15 (!) 197/91  09/20/15 (!) 186/101   Hyperlipidemia-  Lab Results  Component Value Date   CHOL 201 (H) 04/06/2015   HDL 28 (L) 04/06/2015   LDLCALC 132 (H) 04/06/2015   LDLDIRECT 117.0 09/25/2014   TRIG 203 (H) 04/06/2015   CHOLHDL 7.2 04/06/2015   Anemia- the patient has been following with hematology and receiving iron infusions and intermittent blood transfusions.  Last transfusion was 09/20/15.   Lab  Results  Component Value Date   WBC 7.1 09/20/2015   HGB 9.1 (L) 09/20/2015   HCT 26.9 (L) 09/20/2015   MCV 90.0 09/20/2015   PLT 250 09/20/2015       Review of Systems See HPI  Past Medical History:  Diagnosis Date  . 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 Anemia, iron deficiency 03/24/2011   a. Recurrent GI bleed, tx with periodic iron infusions.  . 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 AVM (arteriovenous malformation) of duodenum, acquired    egds in 01/2012, 04/2011  . BPH (benign prostatic hyperplasia)   . CAD (coronary artery disease)    a. Cath 09/2012: moderate borderline CAD in mid LAD/small diagonal branch, mild RCA stenosis, to be managed medically   . Chronic lower back pain   . Diabetic peripheral neuropathy ([place]) 2014   foot pain.  . Fatty liver    on CT of 11/2010  . GERD (gastroesophageal reflux disease)   . GI bleed    a. Recurrent GI bleed, tx with IV iron. b. Per heme notes - likely AVMs 04/2012 (tx with cauterization several months ago).  . Hematuria    a. Urology note scan from 07/2012: cystoscopy without evidence for bladder lesion, only lateral hypertrophy of posterior urethra, bladder  impression from BPH. b. Pt states he had "some tests" scheduled for later in July 2014.  . High cholesterol   . Hypertension   . 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 Orthostatic hypotension    a. Tx with florinef.  . Peripheral neuropathy ([place])   . Polyp, colonic    Colonoscopy 01/2012 "benign" polyp  . Prostate cancer ([place])   . Sleep apnea    "had mask; couldn't sleep in it" (09/20/2012)  . Smoker   . Stage III chronic kidney disease    a. Stage 3 (DM with complications ->CKD, peripheral neuropathy).  . Stroke ([place])    x 2  . Type II diabetes mellitus ([place])    a. Dx 1994, uncontrolled.      Social History   Social History  . Marital status: Married    Spouse name: N/A  . Number of children: 2  . Years of education: N/A    Occupational History  . taken out of work due to back    Social History Main Topics  . Smoking status: Current Some Day Smoker    Packs/day: 1.00    Years: 43.00    Types: Cigarettes    Start date: 04/15/1968  . Smokeless tobacco: Never Used     Comment: 07-22--2016  still smoking  . Alcohol use No     Comment: 09/20/2012 "Used to; stopped ~ 2009; never had problem w/it"  . Drug use: No  . Sexual activity: No   Other Topics Concern  . Not on file   Social History Narrative   Regular exercise: rides horses   Caffeine use: occasionally    Past Surgical History:  Procedure Laterality Date  . ENTEROSCOPY N/A 03/01/2013   Procedure: ENTEROSCOPY;  Surgeon: Inda Castle, MD;  Location: [place];  Service: Endoscopy;  Laterality: N/A;  . GIVENS CAPSULE STUDY N/A 04/05/2015   Procedure: GIVENS CAPSULE STUDY;  Surgeon: Gatha Mayer, MD;  Location: [place];  Service: Endoscopy;  Laterality: N/A;  . INGUINAL HERNIA REPAIR Right 2011  . LEFT HEART CATHETERIZATION WITH CORONARY ANGIOGRAM N/A 09/21/2012   Procedure: LEFT HEART CATHETERIZATION WITH CORONARY ANGIOGRAM;  Surgeon: Peter M Martinique, MD;  Location: [facility] CATH LAB;  Service: Cardiovascular;  Laterality: N/A;  . LUMBAR DISC SURGERY  1980's  . LYMPHADENECTOMY Bilateral 01/19/2013   Procedure: LYMPHADENECTOMY;  Surgeon: Bernestine Amass, MD;  Location: [facility];  Service: Urology;  Laterality: Bilateral;  . ROBOT ASSISTED LAPAROSCOPIC RADICAL PROSTATECTOMY N/A 01/19/2013   Procedure: ROBOTIC ASSISTED LAPAROSCOPIC RADICAL PROSTATECTOMY;  Surgeon: Bernestine Amass, MD;  Location: [facility];  Service: Urology;  Laterality: N/A;  . SHOULDER OPEN ROTATOR CUFF REPAIR Left 1980's    Family History  Problem Relation Age of Onset  . Heart attack Brother     Died at 51  . Stroke Father     Died at 29  . Diabetes Sister   . Diabetes Mother   . Stomach cancer Brother   . Heart attack Sister     No Known Allergies  Current Outpatient  Prescriptions on File Prior to Visit  Medication Sig Dispense Refill  . aspirin 325 MG tablet Take 1 tablet (325 mg total) by mouth daily. (Patient not taking: Reported on 10/08/2015) 30 tablet 0   No current facility-administered medications on file prior to visit.     BP 140/80 (BP Location: Left Arm)   Pulse 82   Temp 97.5 F (36.4 C) (Oral)   Ht 5'  7" (1.702 m)   Wt 142 lb (64.4 kg)   SpO2 96% Comment: room air  BMI 22.24 kg/m       Objective:   Physical Exam  Constitutional: He is oriented to person, place, and time. He appears well-developed and well-nourished. No distress.  HENT:  Head: Normocephalic and atraumatic.  Cardiovascular: Normal rate and regular rhythm.   No murmur heard. 1+ bilateral LE edema  Pulmonary/Chest: Effort normal and breath sounds normal. No respiratory distress. He has no wheezes. He has no rales.  Musculoskeletal: He exhibits no edema.  Neurological: He is alert and oriented to person, place, and time.  Skin: Skin is warm and dry.  Psychiatric: He has a normal mood and affect. His behavior is normal. Thought content normal.          Assessment & Plan:

## 2015-10-08 NOTE — Assessment & Plan Note (Signed)
Will see how A1C looks. If above goal, consider adding in a generic/more affordable insulin.

## 2015-10-08 NOTE — Assessment & Plan Note (Addendum)
Reports that he has not needed nitroglycerin.  I did give him a printed rx and a coupon for good rx at [facility]. Advised patient to add back in the aspirin and will resume coreg at decreased dose.  Advised pt to fill coreg at [facility] where it is on the $4 plan.

## 2015-10-08 NOTE — Assessment & Plan Note (Signed)
Advised pt to restart atorvastatin.  I gave him a good rx coupon to bring to [facility] which should bring cost down to $16/month.

## 2015-10-08 NOTE — Progress Notes (Signed)
Pre visit review using our clinic review tool, if applicable. No additional management support is needed unless otherwise documented below in the visit note. 

## 2015-10-08 NOTE — Telephone Encounter (Signed)
Addendum: Spoke with patient around 2:30 and advised him to go to the [facility] regional ED for further treatment. Pt verbalized understanding and agrees to proceed to the ED now. Report given to Peter Congo (Triage RN) at [facility] regional ED.

## 2015-10-08 NOTE — Telephone Encounter (Signed)
Spoke to patient and patient's wife. Advised them both that cr is worse and K+ is very high. Pt's nephrologist is Dr. Olivia Mackie

## 2015-10-08 NOTE — Assessment & Plan Note (Signed)
Will obtain follow up cbc.  

## 2015-10-09 ENCOUNTER — Telehealth: Payer: Self-pay | Admitting: Family

## 2015-10-09 NOTE — Telephone Encounter (Signed)
Patient is being discharged from the hospital on 7/27 and needs a Hospital Follow up within 10 days. No available Hospital Follow up time. Plse adv and I will call patient to give him the appointment time.

## 2015-10-09 NOTE — Telephone Encounter (Signed)
If the slots are still available, you can combine the 1:45 and 2pm slots on 10/16/15 for his hospital follow up.

## 2015-10-09 NOTE — Telephone Encounter (Signed)
Spoke with patient and he will have his wife to call back to get appointment date and time.

## 2015-10-11 ENCOUNTER — Encounter: Payer: Self-pay | Admitting: Family

## 2015-10-11 ENCOUNTER — Ambulatory Visit (HOSPITAL_BASED_OUTPATIENT_CLINIC_OR_DEPARTMENT_OTHER): Payer: Medicare Other | Admitting: Family

## 2015-10-11 ENCOUNTER — Other Ambulatory Visit (HOSPITAL_BASED_OUTPATIENT_CLINIC_OR_DEPARTMENT_OTHER): Payer: Medicare Other

## 2015-10-11 VITALS — BP 123/67 | HR 101 | Temp 98.1°F | Resp 18 | Wt 136.0 lb

## 2015-10-11 DIAGNOSIS — N289 Disorder of kidney and ureter, unspecified: Secondary | ICD-10-CM

## 2015-10-11 DIAGNOSIS — D631 Anemia in chronic kidney disease: Secondary | ICD-10-CM

## 2015-10-11 DIAGNOSIS — D500 Iron deficiency anemia secondary to blood loss (chronic): Secondary | ICD-10-CM

## 2015-10-11 DIAGNOSIS — D509 Iron deficiency anemia, unspecified: Secondary | ICD-10-CM

## 2015-10-11 DIAGNOSIS — Q2733 Arteriovenous malformation of digestive system vessel: Secondary | ICD-10-CM

## 2015-10-11 DIAGNOSIS — N189 Chronic kidney disease, unspecified: Secondary | ICD-10-CM

## 2015-10-11 DIAGNOSIS — D649 Anemia, unspecified: Secondary | ICD-10-CM

## 2015-10-11 DIAGNOSIS — E119 Type 2 diabetes mellitus without complications: Secondary | ICD-10-CM

## 2015-10-11 DIAGNOSIS — C61 Malignant neoplasm of prostate: Secondary | ICD-10-CM

## 2015-10-11 LAB — COMPREHENSIVE METABOLIC PANEL
ALT: <9 U/L (ref 0–55)
AST: 14 U/L (ref 5–34)
Albumin: 2.7 g/dL — ABNORMAL LOW (ref 3.5–5.0)
Alkaline Phosphatase: 103 U/L (ref 40–150)
Anion Gap: 11 mEq/L (ref 3–11)
BILIRUBIN TOTAL: 0.34 mg/dL (ref 0.20–1.20)
BUN: 18.7 mg/dL (ref 7.0–26.0)
CO2: 28 meq/L (ref 22–29)
Calcium: 9.2 mg/dL (ref 8.4–10.4)
Chloride: 102 mEq/L (ref 98–109)
Creatinine: 5.9 mg/dL (ref 0.7–1.3)
EGFR: 10 mL/min/{1.73_m2} — AB (ref >90)
GLUCOSE: 138 mg/dL (ref 70–140)
POTASSIUM: 4.3 meq/L (ref 3.5–5.1)
SODIUM: 141 meq/L (ref 136–145)
TOTAL PROTEIN: 7.3 g/dL (ref 6.4–8.3)

## 2015-10-11 LAB — CBC WITH DIFFERENTIAL (CANCER CENTER ONLY)
BASO#: 0 10*3/uL (ref 0.0–0.2)
BASO%: 0.4 % (ref 0.0–2.0)
EOS ABS: 0.2 10*3/uL (ref 0.0–0.5)
EOS%: 2.6 % (ref 0.0–7.0)
HCT: 31.2 % — ABNORMAL LOW (ref 38.7–49.9)
HEMOGLOBIN: 10.8 g/dL — AB (ref 13.0–17.1)
LYMPH#: 1.1 10*3/uL (ref 0.9–3.3)
LYMPH%: 13.6 % — AB (ref 14.0–48.0)
MCH: 31 pg (ref 28.0–33.4)
MCHC: 34.6 g/dL (ref 32.0–35.9)
MCV: 90 fL (ref 82–98)
MONO#: 1 10*3/uL — ABNORMAL HIGH (ref 0.1–0.9)
MONO%: 13 % (ref 0.0–13.0)
NEUT%: 70.4 % (ref 40.0–80.0)
NEUTROS ABS: 5.4 10*3/uL (ref 1.5–6.5)
Platelets: 250 10*3/uL (ref 145–400)
RBC: 3.48 10*6/uL — AB (ref 4.20–5.70)
RDW: 14.9 % (ref 11.1–15.7)
WBC: 7.7 10*3/uL (ref 4.0–10.0)

## 2015-10-11 LAB — FERRITIN: Ferritin: 834 ng/ml — ABNORMAL HIGH (ref 22–316)

## 2015-10-11 LAB — IRON AND TIBC
%SAT: 34 % (ref 20–55)
IRON: 66 ug/dL (ref 42–163)
TIBC: 195 ug/dL — AB (ref 202–409)
UIBC: 129 ug/dL (ref 117–376)

## 2015-10-11 LAB — CHCC SATELLITE - SMEAR

## 2015-10-11 NOTE — Progress Notes (Signed)
Hematology and Oncology Follow Up Visit  MAYWOOD WARFEL [id number] 11-21-1945 70 y.o. 10/11/2015   Principle Diagnosis:  1. 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  Current Therapy:   1. IV iron as indicated 2. Supportive blood transfusion as needed     Interim History:  Mr. Slaybaugh is here today for a follow-up. He is doing fairly well. He had a vas cath placed and started dialysis yesterday. He will have an AV graft placed on 8/15. He states that he did well with dialysis and did not experience any fatigue or cramping.  He has lost some weight. He states that his appetite comes and goes. He is supplementing some with Glucerna. His weight is down 6 lbs from 7/24.  He is staying well hydrated. His blood sugars have been controlled in the 120's. He is off of insulin due to the cost but still performs daily glucose checks.  He has occasional palpitations. No chest pain.  No fever, chills, n/v, cough, rash, dizziness SOB, or changes in bowel or bladder habits. He is still making urine.  The neuropathy in his feet which is better on gabapentin. No swelling or tenderness in his extremities.   Of note: Patient no longer receiving Aranesp due to nonhemorrhagic stroke  Medications:    Medication List       Accurate as of 10/11/15 11:01 AM. Always use your most recent med list.          aspirin 325 MG tablet Take 1 tablet (325 mg total) by mouth daily.   atorvastatin 80 MG tablet Commonly known as:  LIPITOR Take 1 tablet (80 mg total) by mouth daily at 6 PM.   carvedilol 3.125 MG tablet Commonly known as:  COREG Take 1 tablet (3.125 mg total) by mouth 2 (two) times daily with a meal.   nitroGLYCERIN 0.4 MG SL tablet Commonly known as:  NITROSTAT Place 1 tablet (0.4 mg total) under the tongue every 5 (five) minutes as needed for chest pain (up to 3 doses).        Allergies: No Known Allergies  Past Medical History, Surgical history, Social history, and Family History were reviewed and updated.  Review of Systems: All other 10 point review of systems is negative.   Physical Exam:  weight is 136 lb (61.7 kg). His oral temperature is 98.1 F (36.7 C). His blood pressure is 123/67 and his pulse is 101 (abnormal). His respiration is 18.   Wt Readings from Last 3 Encounters:  10/11/15 136 lb (61.7 kg)  10/08/15 142 lb (64.4 kg)  09/20/15 157 lb (71.2 kg)    Ocular: Sclerae unicteric, pupils equal, round and reactive to light Ear-nose-throat: Oropharynx clear, dentition fair Lymphatic: No cervical supraclavicular or axillary adenopathy Lungs no rales or rhonchi, good excursion bilaterally Heart regular rate and rhythm, no murmur appreciated Abd soft, nontender, positive bowel sounds, no liver or spleen tip palpated on exam, no fluid wave MSK no focal spinal tenderness, no joint edema Neuro: non-focal, well-oriented, appropriate affect  Lab Results  Component Value Date   WBC 7.7 10/11/2015   HGB 10.8 (L) 10/11/2015   HCT 31.2 (L) 10/11/2015   MCV 90 10/11/2015   PLT 250 10/11/2015   Lab Results  Component Value Date   FERRITIN 586 (H) 09/20/2015   IRON 74 09/20/2015   TIBC 186 (L) 09/20/2015   UIBC 112 (L) 09/20/2015   IRONPCTSAT 40  09/20/2015   Lab Results  Component Value Date   RETICCTPCT 3.0 (H) 03/15/2015   RBC 3.48 (L) 10/11/2015   RETICCTABS 76.5 03/15/2015   No results found for: KPAFRELGTCHN, LAMBDASER, KAPLAMBRATIO No results found for: IGGSERUM, IGA, IGMSERUM No results found for: Odetta Pink, SPEI   Chemistry      Component Value Date/Time   NA 137 10/08/2015 0942   NA 141 09/20/2015 0953   K 6.2 (HH) 10/08/2015 0942   K 5.6 (H) 09/20/2015 0953   CL 111 10/08/2015 0942   CL 106 07/27/2014 0821   CO2 20 10/08/2015 0942   CO2 18 (L) 09/20/2015 0953    BUN 69 (H) 10/08/2015 0942   BUN 60.8 (H) 09/20/2015 0953   CREATININE 9.53 (HH) 10/08/2015 0942   CREATININE 8.8 Repeated and Verified (HH) 09/20/2015 0953      Component Value Date/Time   CALCIUM 9.0 10/08/2015 0942   CALCIUM 8.3 (L) 09/20/2015 0953   ALKPHOS 102 09/20/2015 0953   AST 10 09/20/2015 0953   ALT 10 09/20/2015 0953   BILITOT 0.30 09/20/2015 0953     Impression and Plan: Mr. Bauer is 70 year old gentleman with multifactorial anemia (iron deficiency due to chronic GI bleed - AVM's/renal insufficiency due to uncontrolled diabetes). His counts at this time are stable. He is "feeling all right" and has no complaints at this time.  He started dialysis yesterday and will be on a M,W,F schedule. He will have an AV graft placed on 8/15. His Creatinine today is down to 5.9.  His Hgb is stable at 10.8 with an MCV of 90. He will not need transfused at this time.  He will increase his protein and caloric intake to promote weight gain. He will also continue to supplement with Glucerna as needed.  We will plan to see him back in 1 month for labs and follow-up.  He will contact our office with any questions or concerns. We can certainly see him sooner if need be.   Eliezer Bottom, NP 7/27/201711:01 AM

## 2015-10-11 NOTE — Telephone Encounter (Signed)
Relationship to patient: self Can be reached: [phone]  Reason for call: Pt stated he missed a call from [place] yesterday that stated to call her back. He said that reason was not indicated. I notified pt of appt and he said he was aware and have Gilmore Laroche call back tomorrow when in office.

## 2015-10-12 LAB — RETICULOCYTES: Reticulocyte Count: 1.4 % (ref 0.6–2.6)

## 2015-10-12 MED ORDER — NEPRO/CARBSTEADY PO LIQD: 237.0000 mL | Freq: Three times a day (TID) | ORAL | 0 refills | Status: DC

## 2015-10-12 NOTE — Telephone Encounter (Signed)
Could you please contact pt to arrange TCM follow up?  

## 2015-10-15 NOTE — Telephone Encounter (Signed)
I was out of the office on Friday and am just now seeing this. Pt is now unfortunately already outside the time-frame for TCM eligibility, but he does already have HFU appt scheduled for tomorrow.

## 2015-10-16 ENCOUNTER — Inpatient Hospital Stay: Payer: Medicare Other | Admitting: Family

## 2015-10-16 DIAGNOSIS — Z0289 Encounter for other administrative examinations: Secondary | ICD-10-CM

## 2015-10-16 NOTE — Telephone Encounter (Signed)
I did not see that I had called pt. Pt has appt with PCP today and will address if any concerns.

## 2015-10-17 ENCOUNTER — Telehealth: Payer: Self-pay | Admitting: Family

## 2015-10-17 NOTE — Telephone Encounter (Signed)
Yes, please charge

## 2015-10-17 NOTE — Telephone Encounter (Signed)
Pt was no show 10/16/15 for hosp f/u, pt has not rescheduled, charge or no charge?

## 2015-10-18 ENCOUNTER — Encounter: Payer: Self-pay | Admitting: Family

## 2015-10-18 NOTE — Telephone Encounter (Signed)
Marked to charge and mailing no show letter °

## 2015-10-19 ENCOUNTER — Ambulatory Visit: Payer: Medicare Other | Admitting: Family

## 2015-10-25 ENCOUNTER — Encounter: Payer: Self-pay | Admitting: Family

## 2015-10-25 ENCOUNTER — Ambulatory Visit (INDEPENDENT_AMBULATORY_CARE_PROVIDER_SITE_OTHER): Payer: Medicare Other | Admitting: Family

## 2015-10-25 DIAGNOSIS — D631 Anemia in chronic kidney disease: Secondary | ICD-10-CM | POA: Diagnosis not present

## 2015-10-25 DIAGNOSIS — I639 Cerebral infarction, unspecified: Secondary | ICD-10-CM

## 2015-10-25 DIAGNOSIS — N186 End stage renal disease: Secondary | ICD-10-CM | POA: Diagnosis not present

## 2015-10-25 DIAGNOSIS — J309 Allergic rhinitis, unspecified: Secondary | ICD-10-CM

## 2015-10-25 DIAGNOSIS — N189 Chronic kidney disease, unspecified: Secondary | ICD-10-CM

## 2015-10-25 DIAGNOSIS — Z992 Dependence on renal dialysis: Secondary | ICD-10-CM

## 2015-10-25 DIAGNOSIS — Z720 Tobacco use: Secondary | ICD-10-CM | POA: Diagnosis not present

## 2015-10-25 DIAGNOSIS — E440 Moderate protein-calorie malnutrition: Secondary | ICD-10-CM

## 2015-10-25 MED ORDER — LORATADINE 10 MG PO TABS: 10.0000 mg | ORAL_TABLET | ORAL | 11 refills | Status: DC

## 2015-10-25 MED ORDER — VARENICLINE TARTRATE 0.5 MG PO TABS: 0.5000 mg | ORAL_TABLET | Freq: Every day | ORAL | 1 refills | Status: DC

## 2015-10-25 NOTE — Progress Notes (Signed)
Subjective:    Patient ID: [name], male    DOB: Jul 26, 1945, 70 y.o.   MRN: [id number]  HPI  Mr. Gieser is a 63 male who presents today for hospital follow up. He was admitted 7/24-7/26 with advanced renal failure and hyperkalemia.  He was admitted and had a R IJ placed.  He also received a unit of blood due to anemia. Hemodialysis was initiated during this visit.  He was discharged on 10/10/15.   He is currently undergoing HD on Monday/wednesday and Friday at the [facility].   Wt Readings from Last 3 Encounters:  10/25/15 140 lb (63.5 kg)  10/11/15 136 lb (61.7 kg)  10/08/15 142 lb (64.4 kg)   Reports improvement in his nausea, denies cp/sob/swelling. Reports ears feel "stopped up."  Denies fever.    + nasal congestion.  Not improved with flonase.   Tobacco abuse- he is interested in quitting smoking.   Review of Systems See HPI  Past Medical History:  Diagnosis Date  . 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 Anemia, iron deficiency 03/24/2011   a. Recurrent GI bleed, tx with periodic iron infusions.  . 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 AVM (arteriovenous malformation) of duodenum, acquired    egds in 01/2012, 04/2011  . BPH (benign prostatic hyperplasia)   . CAD (coronary artery disease)    a. Cath 09/2012: moderate borderline CAD in mid LAD/small diagonal branch, mild RCA stenosis, to be managed medically   . Chronic lower back pain   . Diabetic peripheral neuropathy ([place]) 2014   foot pain.  . Fatty liver    on CT of 11/2010  . GERD (gastroesophageal reflux disease)   . GI bleed    a. Recurrent GI bleed, tx with IV iron. b. Per heme notes - likely AVMs 04/2012 (tx with cauterization several months ago).  . Hematuria    a. Urology note scan from 07/2012: cystoscopy without evidence for bladder lesion, only lateral hypertrophy of posterior urethra, bladder impression from BPH. b. Pt states he had "some tests" scheduled for later in July 2014.  . High  cholesterol   . Hypertension   . 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 Orthostatic hypotension    a. Tx with florinef.  . Peripheral neuropathy ([place])   . Polyp, colonic    Colonoscopy 01/2012 "benign" polyp  . Prostate cancer ([place])   . Sleep apnea    "had mask; couldn't sleep in it" (09/20/2012)  . Smoker   . Stage III chronic kidney disease    a. Stage 3 (DM with complications ->CKD, peripheral neuropathy).  . Stroke ([place])    x 2  . Type II diabetes mellitus ([place])    a. Dx 1994, uncontrolled.      Social History   Social History  . Marital status: Married    Spouse name: N/A  . Number of children: 2  . Years of education: N/A   Occupational History  . taken out of work due to back    Social History Main Topics  . Smoking status: Current Some Day Smoker    Packs/day: 1.00    Years: 43.00    Types: Cigarettes    Start date: 04/15/1968  . Smokeless tobacco: Never Used     Comment: 07-22--2016  still smoking  . Alcohol use No     Comment: 09/20/2012 "Used to; stopped ~ 2009; never had problem w/it"  . Drug use: No  . Sexual activity:  No   Other Topics Concern  . Not on file   Social History Narrative   Regular exercise: rides horses   Caffeine use: occasionally    Past Surgical History:  Procedure Laterality Date  . ENTEROSCOPY N/A 03/01/2013   Procedure: ENTEROSCOPY;  Surgeon: Inda Castle, MD;  Location: [place];  Service: Endoscopy;  Laterality: N/A;  . GIVENS CAPSULE STUDY N/A 04/05/2015   Procedure: GIVENS CAPSULE STUDY;  Surgeon: Gatha Mayer, MD;  Location: [place];  Service: Endoscopy;  Laterality: N/A;  . INGUINAL HERNIA REPAIR Right 2011  . LEFT HEART CATHETERIZATION WITH CORONARY ANGIOGRAM N/A 09/21/2012   Procedure: LEFT HEART CATHETERIZATION WITH CORONARY ANGIOGRAM;  Surgeon: Peter M Martinique, MD;  Location: [facility] CATH LAB;  Service: Cardiovascular;  Laterality: N/A;  . LUMBAR DISC SURGERY  1980's  . LYMPHADENECTOMY Bilateral  01/19/2013   Procedure: LYMPHADENECTOMY;  Surgeon: Bernestine Amass, MD;  Location: [facility];  Service: Urology;  Laterality: Bilateral;  . ROBOT ASSISTED LAPAROSCOPIC RADICAL PROSTATECTOMY N/A 01/19/2013   Procedure: ROBOTIC ASSISTED LAPAROSCOPIC RADICAL PROSTATECTOMY;  Surgeon: Bernestine Amass, MD;  [facility];  Service: Urology;  Laterality: N/A;  . SHOULDER OPEN ROTATOR CUFF REPAIR Left 1980's    Family History  Problem Relation Age of Onset  . Heart attack Brother     Died at 79  . Stroke Father     Died at 18  . Diabetes Sister   . Diabetes Mother   . Stomach cancer Brother   . Heart attack Sister     No Known Allergies  Current Outpatient Prescriptions on File Prior to Visit  Medication Sig Dispense Refill  . atorvastatin (LIPITOR) 80 MG tablet Take 1 tablet (80 mg total) by mouth daily at 6 PM. 30 tablet 5  . carvedilol (COREG) 3.125 MG tablet Take 1 tablet (3.125 mg total) by mouth 2 (two) times daily with a meal. 60 tablet 3  . nitroGLYCERIN (NITROSTAT) 0.4 MG SL tablet Place 1 tablet (0.4 mg total) under the tongue every 5 (five) minutes as needed for chest pain (up to 3 doses). 25 tablet 2  . Nutritional Supplements (FEEDING SUPPLEMENT, NEPRO CARB STEADY,) LIQD Take 237 mLs by mouth 3 (three) times daily between meals. 90 Can 0  . aspirin 325 MG tablet Take 1 tablet (325 mg total) by mouth daily. (Patient not taking: Reported on 10/08/2015) 30 tablet 0   No current facility-administered medications on file prior to visit.     BP (!) 155/69   Pulse (!) 101   Temp 98.5 F (36.9 C) (Oral)   Ht 5\' 7"  (1.702 m)   Wt 140 lb (63.5 kg)   SpO2 99%   BMI 21.93 kg/m       Objective:   Physical Exam  Constitutional: He is oriented to person, place, and time. He appears well-developed and well-nourished. No distress.  HENT:  Head: Normocephalic and atraumatic.  Cardiovascular: Normal rate and regular rhythm.   No murmur heard. Pulmonary/Chest: Effort normal and breath  sounds normal. No respiratory distress. He has no wheezes. He has no rales.  Musculoskeletal: He exhibits no edema.  Neurological: He is alert and oriented to person, place, and time.  Skin: Skin is warm and dry.  R IJ intact, no erythema surrounding insertion site.   Psychiatric: He has a normal mood and affect. His behavior is normal. Thought content normal.          Assessment & Plan:

## 2015-10-25 NOTE — Patient Instructions (Addendum)
Increase Nepro to one can twice daily. Continue 3 meals a day. Please begin chantix once daily.  You can smoke the first week on chantix, then stop chantix.  Add claritin 10mg , one tablet every other day for your nasal congestion.

## 2015-10-25 NOTE — Progress Notes (Signed)
Pre visit review using our clinic tool,if applicable. No additional management support is needed unless otherwise documented below in the visit note.  

## 2015-10-28 DIAGNOSIS — N186 End stage renal disease: Secondary | ICD-10-CM | POA: Insufficient documentation

## 2015-10-28 DIAGNOSIS — J309 Allergic rhinitis, unspecified: Secondary | ICD-10-CM | POA: Insufficient documentation

## 2015-10-28 NOTE — Assessment & Plan Note (Signed)
Pt is requesting handcapped placard due to unsteady gait from cva

## 2015-10-28 NOTE — Assessment & Plan Note (Signed)
Will initiate renaly dosed chantix. Trial of chantix. Common side effects including rare risk of suicide ideation was discussed with the patient today.  Patient is instructed to go directly to the ED if this occurs.  We discussed that patient can continue to smoke for 1 week after starting chantix, but then must discontinue cigarettes.  5 minutes spent with patient today on tobacco cessation counseling.

## 2015-10-28 NOTE — Assessment & Plan Note (Signed)
Advised pt to begin claritin 10mg  QOD.

## 2015-10-28 NOTE — Assessment & Plan Note (Signed)
Discussed adding a second can of Nepro (only drinking one a day).

## 2015-10-28 NOTE — Assessment & Plan Note (Signed)
Feeling better now that he is on HD.  Uremic sxs are resolved.

## 2015-10-28 NOTE — Assessment & Plan Note (Signed)
Has follow up with nephrology. S/p transfusion.  Lab Results  Component Value Date   WBC 7.7 10/11/2015   HGB 10.8 (L) 10/11/2015   HCT 31.2 (L) 10/11/2015   MCV 90 10/11/2015   PLT 250 10/11/2015

## 2015-10-29 ENCOUNTER — Telehealth: Payer: Self-pay | Admitting: *Deleted

## 2015-10-29 NOTE — Telephone Encounter (Signed)
-----   Message from [name], NP sent at 10/28/2015 11:36 AM EDT ----- Mr. Zills requested a handicapped placard form. Could we please forward to Dr. Charlett Blake for signature.  This is due to stroke.   Thanks,  [employer]

## 2015-10-29 NOTE — Telephone Encounter (Signed)
Parking application initiated and forwarded to Dr Charlett Blake for signature.

## 2015-10-31 NOTE — Telephone Encounter (Signed)
Copy of form sent for scanning. Original placed at front desk for pick up. Left message for pt to return my call. Also sent mychart message to pt.

## 2015-11-15 ENCOUNTER — Other Ambulatory Visit (HOSPITAL_BASED_OUTPATIENT_CLINIC_OR_DEPARTMENT_OTHER): Payer: Medicare Other

## 2015-11-15 ENCOUNTER — Ambulatory Visit (HOSPITAL_BASED_OUTPATIENT_CLINIC_OR_DEPARTMENT_OTHER): Payer: Medicare Other | Admitting: Family

## 2015-11-15 ENCOUNTER — Encounter: Payer: Self-pay | Admitting: Family

## 2015-11-15 VITALS — BP 135/77 | HR 111 | Temp 97.9°F | Resp 16 | Ht 67.0 in | Wt 139.0 lb

## 2015-11-15 DIAGNOSIS — N189 Chronic kidney disease, unspecified: Principal | ICD-10-CM

## 2015-11-15 DIAGNOSIS — Q2733 Arteriovenous malformation of digestive system vessel: Secondary | ICD-10-CM

## 2015-11-15 DIAGNOSIS — N184 Chronic kidney disease, stage 4 (severe): Secondary | ICD-10-CM

## 2015-11-15 DIAGNOSIS — D509 Iron deficiency anemia, unspecified: Secondary | ICD-10-CM

## 2015-11-15 DIAGNOSIS — D500 Iron deficiency anemia secondary to blood loss (chronic): Secondary | ICD-10-CM | POA: Diagnosis not present

## 2015-11-15 DIAGNOSIS — E119 Type 2 diabetes mellitus without complications: Secondary | ICD-10-CM

## 2015-11-15 DIAGNOSIS — K5520 Angiodysplasia of colon without hemorrhage: Secondary | ICD-10-CM

## 2015-11-15 DIAGNOSIS — D631 Anemia in chronic kidney disease: Secondary | ICD-10-CM | POA: Diagnosis not present

## 2015-11-15 DIAGNOSIS — C61 Malignant neoplasm of prostate: Secondary | ICD-10-CM

## 2015-11-15 DIAGNOSIS — N186 End stage renal disease: Secondary | ICD-10-CM

## 2015-11-15 DIAGNOSIS — Z992 Dependence on renal dialysis: Secondary | ICD-10-CM

## 2015-11-15 DIAGNOSIS — K922 Gastrointestinal hemorrhage, unspecified: Secondary | ICD-10-CM

## 2015-11-15 DIAGNOSIS — N179 Acute kidney failure, unspecified: Secondary | ICD-10-CM

## 2015-11-15 LAB — IRON AND TIBC
%SAT: 31 % (ref 20–55)
Iron: 59 ug/dL (ref 42–163)
TIBC: 193 ug/dL — AB (ref 202–409)
UIBC: 133 ug/dL (ref 117–376)

## 2015-11-15 LAB — CBC WITH DIFFERENTIAL (CANCER CENTER ONLY)
BASO#: 0.1 10*3/uL (ref 0.0–0.2)
BASO%: 1 % (ref 0.0–2.0)
EOS%: 4.4 % (ref 0.0–7.0)
Eosinophils Absolute: 0.3 10*3/uL (ref 0.0–0.5)
HCT: 27.1 % — ABNORMAL LOW (ref 38.7–49.9)
HEMOGLOBIN: 9.2 g/dL — AB (ref 13.0–17.1)
LYMPH#: 1.5 10*3/uL (ref 0.9–3.3)
LYMPH%: 22.4 % (ref 14.0–48.0)
MCH: 32.3 pg (ref 28.0–33.4)
MCHC: 33.9 g/dL (ref 32.0–35.9)
MCV: 95 fL (ref 82–98)
MONO#: 1.1 10*3/uL — ABNORMAL HIGH (ref 0.1–0.9)
MONO%: 15.3 % — AB (ref 0.0–13.0)
NEUT%: 56.9 % (ref 40.0–80.0)
NEUTROS ABS: 3.9 10*3/uL (ref 1.5–6.5)
Platelets: 334 10*3/uL (ref 145–400)
RBC: 2.85 10*6/uL — AB (ref 4.20–5.70)
RDW: 15.5 % (ref 11.1–15.7)
WBC: 6.9 10*3/uL (ref 4.0–10.0)

## 2015-11-15 LAB — COMPREHENSIVE METABOLIC PANEL
ANION GAP: 10 meq/L (ref 3–11)
AST: 12 U/L (ref 5–34)
Albumin: 2.7 g/dL — ABNORMAL LOW (ref 3.5–5.0)
Alkaline Phosphatase: 117 U/L (ref 40–150)
BUN: 18.7 mg/dL (ref 7.0–26.0)
CO2: 27 meq/L (ref 22–29)
CREATININE: 5.3 mg/dL — AB (ref 0.7–1.3)
Calcium: 8.9 mg/dL (ref 8.4–10.4)
Chloride: 104 mEq/L (ref 98–109)
EGFR: 12 mL/min/{1.73_m2} — ABNORMAL LOW (ref >90)
GLUCOSE: 129 mg/dL (ref 70–140)
Potassium: 4 mEq/L (ref 3.5–5.1)
Sodium: 141 mEq/L (ref 136–145)
TOTAL PROTEIN: 7.2 g/dL (ref 6.4–8.3)

## 2015-11-15 LAB — CHCC SATELLITE - SMEAR

## 2015-11-15 LAB — FERRITIN: Ferritin: 538 ng/ml — ABNORMAL HIGH (ref 22–316)

## 2015-11-15 NOTE — Progress Notes (Signed)
Hematology and Oncology Follow Up Visit  [name] [id number] Jan 25, 1946 70 y.o. 11/15/2015   Principle Diagnosis:  1. 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 Anemia of renal insufficiency - end stage 4 - secondary to poorly controlled diabetes - on hemo dialysis  Current Therapy:   1. IV iron as indicated 2. Supportive blood transfusion as needed     Interim History:  [name] is here today for a follow-up. He is doing well on dialysis and has had no issues with fluid overload. He has no complaints at this time.   He will had a left lower extremity AV graft placed on 8/15 and appears to be maturing nicely. + thrill/bruit. He currently has a vas cath in place that is used for dialysis.  He has a good appetite and is staying hydrated but is on fluid restriction. His weight is stable.  He states that he has his glucose checked at dialysis and this has been controlled.  He has occasional palpitations. No chest pain.  No fever, chills, n/v, cough, rash, dizziness SOB, or changes in bowel or bladder habits. He is still making urine. He has some constipation at times and takes Mirilax daily. He will take BID PRN.  He still has some blood in his stool occasionally. This is due to multiple AVM's that will bleed off and on. We will continue to transfuse and give IV iron as needed.  The neuropathy in his feet which is unchanged. No swelling or tenderness in his extremities.   Of note: Patient no longer receiving Aranesp due to nonhemorrhagic stroke  Medications:    Medication List       Accurate as of 11/15/15  9:39 AM. Always use your most recent med list.          aspirin 325 MG tablet Take 1 tablet (325 mg total) by mouth daily.   atorvastatin 80 MG tablet Commonly known as:  LIPITOR Take 1 tablet (80 mg total) by mouth daily at 6 PM.   carvedilol 3.125 MG tablet Commonly known as:  COREG Take 1 tablet (3.125 mg total) by  mouth 2 (two) times daily with a meal.   feeding supplement (NEPRO CARB STEADY) Liqd Take 237 mLs by mouth 3 (three) times daily between meals.   loratadine 10 MG tablet Commonly known as:  CLARITIN Take 1 tablet (10 mg total) by mouth every other day.   nitroGLYCERIN 0.4 MG SL tablet Commonly known as:  NITROSTAT Place 1 tablet (0.4 mg total) under the tongue every 5 (five) minutes as needed for chest pain (up to 3 doses).   varenicline 0.5 MG tablet Commonly known as:  CHANTIX Take 1 tablet (0.5 mg total) by mouth daily.       Allergies: No Known Allergies  Past Medical History, Surgical history, Social history, and Family History were reviewed and updated.  Review of Systems: All other 10 point review of systems is negative.   Physical Exam:  height is 5\' 7"  (1.702 m) and weight is 139 lb (63 kg). His oral temperature is 97.9 F (36.6 C). His blood pressure is 135/77 and his pulse is 111 (abnormal). His respiration is 16.   Wt Readings from Last 3 Encounters:  11/15/15 139 lb (63 kg)  10/25/15 140 lb (63.5 kg)  10/11/15 136 lb (61.7 kg)    Ocular: Sclerae unicteric, pupils equal, round and reactive to light Ear-nose-throat: Oropharynx clear, dentition fair Lymphatic: No cervical supraclavicular or  axillary adenopathy Lungs no rales or rhonchi, good excursion bilaterally Heart regular rate and rhythm, no murmur appreciated Abd soft, nontender, positive bowel sounds, no liver or spleen tip palpated on exam, no fluid wave MSK no focal spinal tenderness, no joint edema Neuro: non-focal, well-oriented, appropriate affect  Lab Results  Component Value Date   WBC 6.9 11/15/2015   HGB 9.2 (L) 11/15/2015   HCT 27.1 (L) 11/15/2015   MCV 95 11/15/2015   PLT 334 11/15/2015   Lab Results  Component Value Date   FERRITIN 834 (H) 10/11/2015   IRON 66 10/11/2015   TIBC 195 (L) 10/11/2015   UIBC 129 10/11/2015   IRONPCTSAT 34 10/11/2015   Lab Results  Component Value  Date   RETICCTPCT 3.0 (H) 03/15/2015   RBC 2.85 (L) 11/15/2015   RETICCTABS 76.5 03/15/2015   No results found for: KPAFRELGTCHN, LAMBDASER, KAPLAMBRATIO No results found for: IGGSERUM, IGA, IGMSERUM No results found for: Odetta Pink, SPEI   Chemistry      Component Value Date/Time   NA 141 10/11/2015 1016   K 4.3 10/11/2015 1016   CL 111 10/08/2015 0942   CL 106 07/27/2014 0821   CO2 28 10/11/2015 1016   BUN 18.7 10/11/2015 1016   CREATININE 5.9 (HH) 10/11/2015 1016      Component Value Date/Time   CALCIUM 9.2 10/11/2015 1016   ALKPHOS 103 10/11/2015 1016   AST 14 10/11/2015 1016   ALT <9 10/11/2015 1016   BILITOT 0.34 10/11/2015 1016     Impression and Plan: [name] is 70 year old gentleman with multifactorial anemia (iron deficiency due to chronic GI bleed - AVM's/renal insufficiency due to uncontrolled diabetes - on hemodialysis). Hgb is stable at 9.2 with an MCV of 95. He will not need transfused at this time. He is doing well and has no complaints.  He has tolerated dialysis M,W,F nicely. He had his AV graft placed on 8/15 but [place] still using the vas cath now. BUN and creatinine levels are pending.  We will see what his iron studies show and bring him back in next week for an infusion if needed.  We will plan to see him back in 1 month for labs and follow-up.  He will contact our office with any questions or concerns. We can certainly see him sooner if need be.   Eliezer Bottom, NP 8/31/20179:39 AM

## 2015-11-16 ENCOUNTER — Ambulatory Visit (HOSPITAL_COMMUNITY)
Admission: RE | Admit: 2015-11-16 | Discharge: 2015-11-16 | Disposition: A | Discharge status: Home / Self Care | Payer: Medicare Other | Source: Ambulatory Visit | Attending: Hematology & Oncology | Admitting: Hematology & Oncology

## 2015-11-16 LAB — RETICULOCYTES: RETICULOCYTE COUNT: 3.4 % — AB (ref 0.6–2.6)

## 2015-11-20 ENCOUNTER — Ambulatory Visit: Payer: Medicare Other | Admitting: Family

## 2015-11-20 ENCOUNTER — Telehealth: Payer: Self-pay | Admitting: Family

## 2015-11-20 ENCOUNTER — Telehealth: Payer: Self-pay | Admitting: *Deleted

## 2015-11-20 NOTE — Telephone Encounter (Signed)
Spoke with pt's spouse. She states this is not a new problem for pt but is steadily worsening. Has right leg weakness and stumbles when he walks. Took gabapentin x 2 years but stopped it as he felt it wasn't helping. Currently undergoing dialysis and requests appt on Tuesday or Thursday. Advised her that pt missed appt with PCP today and she was unaware that he had appt today. Rescheduled f/u for 11/27/15 at 1:30pm. Can discuss referral at that time.

## 2015-11-20 NOTE — Telephone Encounter (Signed)
Noted  

## 2015-11-20 NOTE — Telephone Encounter (Signed)
Pt's spouse called in to request a call back from [facility] , She says that Dr. Dicie Beam office suggested that they have PCP refer pt to see someone about pt's leg. Pt's leg feels really weak, pt stubbles when he walks.   [id number]- Spouse

## 2015-11-20 NOTE — Telephone Encounter (Signed)
Received call from patient wife stating that patient has had problems with dragging his leg and weakness in lower extremities.  Asked for referral.  Called wife back stating that patient needs to call his primary care physician with these symptoms and referral.

## 2015-11-27 ENCOUNTER — Other Ambulatory Visit (HOSPITAL_BASED_OUTPATIENT_CLINIC_OR_DEPARTMENT_OTHER): Payer: Self-pay | Admitting: Specialist

## 2015-11-27 ENCOUNTER — Telehealth: Payer: Self-pay | Admitting: Family

## 2015-11-27 ENCOUNTER — Encounter: Payer: Self-pay | Admitting: Family

## 2015-11-27 ENCOUNTER — Ambulatory Visit (HOSPITAL_BASED_OUTPATIENT_CLINIC_OR_DEPARTMENT_OTHER)
Admission: RE | Admit: 2015-11-27 | Discharge: 2015-11-27 | Disposition: A | Discharge status: Home / Self Care | Payer: Medicare Other | Source: Ambulatory Visit | Attending: Family | Admitting: Family

## 2015-11-27 ENCOUNTER — Ambulatory Visit (INDEPENDENT_AMBULATORY_CARE_PROVIDER_SITE_OTHER): Payer: Medicare Other | Admitting: Family

## 2015-11-27 VITALS — BP 177/83 | HR 92 | Temp 97.6°F | Resp 18 | Ht 67.0 in | Wt 140.4 lb

## 2015-11-27 DIAGNOSIS — R29898 Other symptoms and signs involving the musculoskeletal system: Secondary | ICD-10-CM | POA: Insufficient documentation

## 2015-11-27 DIAGNOSIS — I639 Cerebral infarction, unspecified: Secondary | ICD-10-CM | POA: Diagnosis not present

## 2015-11-27 DIAGNOSIS — Z23 Encounter for immunization: Secondary | ICD-10-CM

## 2015-11-27 DIAGNOSIS — I638 Other cerebral infarction: Secondary | ICD-10-CM | POA: Diagnosis not present

## 2015-11-27 NOTE — Patient Instructions (Signed)
Please complete your CT scan on the first floor.

## 2015-11-27 NOTE — Telephone Encounter (Signed)
Notified pt and he voices understanding. 

## 2015-11-27 NOTE — Telephone Encounter (Signed)
Please let patient know that I reviewed his CT. It shows new stroke since last CT scan in January.  This is likely cause for his weakness.  I am going to try to get him in ASAP with neurology and order some more testing.  In the meantime, if new/worsening weakness, slurred speech occur, needs to go to the ER.

## 2015-11-27 NOTE — Progress Notes (Signed)
Pre visit review using our clinic review tool, if applicable. No additional management support is needed unless otherwise documented below in the visit note. 

## 2015-11-27 NOTE — Progress Notes (Signed)
Subjective:    Patient ID: [name], male    DOB: [birth year], 70 y.o.   MRN: [id number]  HPI  [name] is a 70 yr old male who presents today to discuss worsening RLE weakness. Wife noted that he stumbles when he walks.  Of note, the patient did have an MRI of the lumbar spine back in 2014 which noted degenerative disc changes with most notable stenoses at L3-4.  In 2015 he underwent x-ray of the lumbar spine which noted:  Multifocal osteoarthritic change, most pronounced at L4-5 and L5-S1.No acute fracture or spondylolisthesis.  He did have an acute Left parietal periventricular deep white matter infarct back in January of this year.    Pt denies any weakness in his arms. He has not fallen.  Denies back pain.  He denies numbness in his right leg.    Review of Systems See HPI  Past Medical History:  Diagnosis Date  . 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 Anemia, iron deficiency 03/24/2011   a. Recurrent GI bleed, tx with periodic iron infusions.  . 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 AVM (arteriovenous malformation) of duodenum, acquired    egds in 01/2012, 04/2011  . BPH (benign prostatic hyperplasia)   . CAD (coronary artery disease)    a. Cath 09/2012: moderate borderline CAD in mid LAD/small diagonal branch, mild RCA stenosis, to be managed medically   . Chronic lower back pain   . Diabetic peripheral neuropathy ([place]) 2014   foot pain.  . Fatty liver    on CT of 11/2010  . GERD (gastroesophageal reflux disease)   . GI bleed    a. Recurrent GI bleed, tx with IV iron. b. Per heme notes - likely AVMs 04/2012 (tx with cauterization several months ago).  . Hematuria    a. Urology note scan from 07/2012: cystoscopy without evidence for bladder lesion, only lateral hypertrophy of posterior urethra, bladder impression from BPH. b. Pt states he had "some tests" scheduled for later in July 2014.  . High cholesterol   . Hypertension   . Intestinal angiodysplasia with  bleeding 03/01/2013  . Orthostatic hypotension    a. Tx with florinef.  . Peripheral neuropathy ([place])   . Polyp, colonic    Colonoscopy 01/2012 "benign" polyp  . Prostate cancer ([place])   . Sleep apnea    "had mask; couldn't sleep in it" (09/20/2012)  . Smoker   . Stage III chronic kidney disease    a. Stage 3 (DM with complications ->CKD, peripheral neuropathy).  . Stroke ([place])    x 2  . Type II diabetes mellitus ([place])    a. Dx 1994, uncontrolled.      Social History   Social History  . Marital status: Married    Spouse name: N/A  . Number of children: 2  . Years of education: N/A   Occupational History  . taken out of work due to back    Social History Main Topics  . Smoking status: Current Some Day Smoker    Packs/day: 1.00    Years: 43.00    Types: Cigarettes    Start date: 04/15/1968  . Smokeless tobacco: Never Used     Comment: 07-22--2016  still smoking  . Alcohol use No     Comment: 09/20/2012 "Used to; stopped ~ 2009; never had problem w/it"  . Drug use: No  . Sexual activity: No   Other Topics Concern  . Not on file  Social History Narrative   Regular exercise: rides horses   Caffeine use: occasionally    Past Surgical History:  Procedure Laterality Date  . ENTEROSCOPY N/A 03/01/2013   Procedure: ENTEROSCOPY;  Surgeon: Inda Castle, MD;  Location: [facility];  Service: Endoscopy;  Laterality: N/A;  . GIVENS CAPSULE STUDY N/A 04/05/2015   Procedure: GIVENS CAPSULE STUDY;  Surgeon: Gatha Mayer, MD;  Location: [place] [facility];  Service: Endoscopy;  Laterality: N/A;  . INGUINAL HERNIA REPAIR Right 2011  . LEFT HEART CATHETERIZATION WITH CORONARY ANGIOGRAM N/A 09/21/2012   Procedure: LEFT HEART CATHETERIZATION WITH CORONARY ANGIOGRAM;  Surgeon: Peter M Martinique, MD;  [facility] C[facility];  Service: Cardiovascular;  Laterality: N/A;  . LUMBAR DISC SURGERY  1980's  . LYMPHADENECTOMY Bilateral 01/19/2013   Procedure: LYMPHADENECTOMY;  Surgeon: Bernestine Amass,  MD;  Location: [facility];  Service: Urology;  Laterality: Bilateral;  . ROBOT ASSISTED LAPAROSCOPIC RADICAL PROSTATECTOMY N/A 01/19/2013   Procedure: ROBOTIC ASSISTED LAPAROSCOPIC RADICAL PROSTATECTOMY;  Surgeon: Bernestine Amass, MD;  Location: [facility];  Service: Urology;  Laterality: N/A;  . SHOULDER OPEN ROTATOR CUFF REPAIR Left 1980's    Family History  Problem Relation Age of Onset  . Stroke Father     Died at 67  . Diabetes Mother   . Heart attack Brother     Died at 88  . Diabetes Sister   . Stomach cancer Brother   . Heart attack Sister     No Known Allergies  Current Outpatient Prescriptions on File Prior to Visit  Medication Sig Dispense Refill  . aspirin 325 MG tablet Take 1 tablet (325 mg total) by mouth daily. 30 tablet 0  . atorvastatin (LIPITOR) 80 MG tablet Take 1 tablet (80 mg total) by mouth daily at 6 PM. 30 tablet 5  . carvedilol (COREG) 3.125 MG tablet Take 1 tablet (3.125 mg total) by mouth 2 (two) times daily with a meal. 60 tablet 3  . loratadine (CLARITIN) 10 MG tablet Take 1 tablet (10 mg total) by mouth every other day. 30 tablet 11  . nitroGLYCERIN (NITROSTAT) 0.4 MG SL tablet Place 1 tablet (0.4 mg total) under the tongue every 5 (five) minutes as needed for chest pain (up to 3 doses). 25 tablet 2  . Nutritional Supplements (FEEDING SUPPLEMENT, NEPRO CARB STEADY,) LIQD Take 237 mLs by mouth 3 (three) times daily between meals. 90 Can 0   No current facility-administered medications on file prior to visit.     BP (!) 177/83 (BP Location: Right Arm, Cuff Size: Normal)   Pulse 92   Temp 97.6 F (36.4 C) (Oral)   Resp 18   Ht 5\' 7"  (1.702 m)   Wt 140 lb 6.4 oz (63.7 kg)   SpO2 100%   BMI 21.99 kg/m       Objective:   Physical Exam  Constitutional: He is oriented to person, place, and time. He appears well-developed and well-nourished. No distress.  HENT:  Head: Normocephalic and atraumatic.  Eyes: Pupils are equal, round, and reactive to light.    Cardiovascular: Normal rate and regular rhythm.   No murmur heard. Pulmonary/Chest: Effort normal and breath sounds normal. No respiratory distress. He has no wheezes. He has no rales.  Musculoskeletal: He exhibits no edema.  Neurological: He is alert and oriented to person, place, and time.  Reflex Scores:      Patellar reflexes are 2+ on the right side and 1+ on the left side. RLE strength  is 4-5/5 LLE strength is 5/5 Slightly unsteady broad based gait is noted.   Skin: Skin is warm and dry.  Psychiatric: He has a normal mood and affect. His behavior is normal. Thought content normal.          Assessment & Plan:  RLE weakness- Concerning for new CVA. Will complete CT head.  If negative consider further imaging of his lumbar spine.  Uncontrolled BP- reports that he did not take his medications today. Reinforced importance of compliance.   We discussed close follow up to address his other medical issues/concerns.

## 2015-11-28 ENCOUNTER — Telehealth: Payer: Self-pay | Admitting: Family

## 2015-11-28 DIAGNOSIS — I639 Cerebral infarction, unspecified: Secondary | ICD-10-CM

## 2015-11-28 DIAGNOSIS — I5022 Chronic systolic (congestive) heart failure: Secondary | ICD-10-CM

## 2015-11-28 MED ORDER — CLOPIDOGREL BISULFATE 75 MG PO TABS: 75.0000 mg | ORAL_TABLET | Freq: Every day | ORAL | 3 refills | Status: DC

## 2015-11-28 NOTE — Telephone Encounter (Signed)
[name]-- I pended barium swallow. Can you check the questions/responses in order and sign if appropriate then route back to me to contact pt.

## 2015-11-28 NOTE — Telephone Encounter (Signed)
Has patient had a MBS? Outpatient Rehab will not see them unless they have had one first. Please advise

## 2015-11-28 NOTE — Telephone Encounter (Signed)
Also, Gilmore Laroche, I spoke with Dr. Carles Collet, neurology and she is working on getting patient worked in to their schedule.  She recommended that we change his aspirin to plavix.  I am sending rx.  Please advise pt to d/c aspirin start plavix.

## 2015-11-28 NOTE — Telephone Encounter (Signed)
Will try to arrange echo and carotid u/s on same day. Will have to call vascular lab to schedule echo then call [facility] to schedule carotid. Attempted to reach vascular lab and call was not picked up. Will try again later.

## 2015-11-28 NOTE — Telephone Encounter (Signed)
Please contact patient to schedule a nurse visit for EKG. Perhaps we can coordinate with one of the other studies I ordered at the [facility].  Please also confirm with him that he is taking aspirin 325mg  once daily. If not, he needs to start. I am going to set him up for swallow evaluation and PT.     Also, I would like him to come to lab for FLP.  Dx hyperlipidemia.

## 2015-11-29 ENCOUNTER — Encounter: Payer: Self-pay | Admitting: Family

## 2015-11-29 ENCOUNTER — Other Ambulatory Visit (HOSPITAL_COMMUNITY): Payer: Self-pay | Admitting: Family

## 2015-11-29 ENCOUNTER — Telehealth: Payer: Self-pay | Admitting: Family

## 2015-11-29 DIAGNOSIS — I5022 Chronic systolic (congestive) heart failure: Secondary | ICD-10-CM

## 2015-11-29 DIAGNOSIS — I639 Cerebral infarction, unspecified: Secondary | ICD-10-CM

## 2015-11-29 DIAGNOSIS — R1310 Dysphagia, unspecified: Secondary | ICD-10-CM

## 2015-11-29 NOTE — Telephone Encounter (Signed)
Ms. Jadore Paolucci (pt's spouse) called in to speak with the nurse about pt's lab results. She says that nurse spoke with pt about results but he didn't understand clearly. She would like a call back to discuss further.    CB: [phone]

## 2015-11-29 NOTE — Telephone Encounter (Signed)
Pt's wife notified of CT results and verbalized understanding (see 11/27/15 phone note). Also notified her of additional testing per provider: echo, carotid US, and MBS. Unfortunately all 3 of these appts are currently scheduled on Wednesdays, and pt's dialysis schedule is MWF, so they will have to be rescheduled. Pt has new pt appt w/ neurology tomorrow that will also need to be rescheduled d/t interference w/ dialysis. Pt's wife will call neurology office to reschedule and I will work on rescheduling other testing. She agrees to take him to ED if any new/worsening weakness or slurred speech. States last night he was 'uncoordinated' after dialysis but has since improved and feels better today.   Melissa, you wanted EKG. Would it be okay to do this at f/u appt w/ you on 12/11/15 or do we need to bring him in for nurse visit sooner?

## 2015-11-29 NOTE — Telephone Encounter (Signed)
MBS rescheduled by Martyn Ehrich and pt's wife Guerry Minors notified. Echo can only be done on Wednesdays at [facility] location, so will have to send pt to [facility] for echo and [place] on Tues/Thurs. Delsa Sale changed location for procedures and will make sure pt gets scheduled.

## 2015-11-30 ENCOUNTER — Ambulatory Visit: Payer: Medicare Other | Admitting: Neurology

## 2015-11-30 NOTE — Telephone Encounter (Signed)
Noted. Tuesday is soonest possible d/t pt's dialysis schedule, but nurse schedule is fully booked that day. Discussed w/ Melissa, okay to double book pt on PCP's schedule for EKG only and Gilmore Laroche will do.

## 2015-11-30 NOTE — Telephone Encounter (Signed)
Noted. I would rather do EKG sooner please.  I am ruling out Atrial fibrillation and potential need for anticoagulation.

## 2015-11-30 NOTE — Telephone Encounter (Signed)
EKG only scheduled 12/04/15 @ 8am.

## 2015-12-01 ENCOUNTER — Other Ambulatory Visit (HOSPITAL_BASED_OUTPATIENT_CLINIC_OR_DEPARTMENT_OTHER): Payer: Medicare Other

## 2015-12-02 NOTE — Progress Notes (Signed)
[name] was seen today in neurologic consultation at the request of [name], NP.  The consultation is for the evaluation of stroke.  This patient is accompanied in the office by his spouse who supplements the history. The records that were made available to me were reviewed.  The patient is a 70 y.o. year old male with a history of renal failure (HD x 1 month) who had a stroke in 03/2015 and per records from Dr. Leonie Man, the patient had an infarct in 2016 as well.  In 03/2015, pt presented to the hospital with acute GI bleed and during his hospital stay, he c/o visual change and weakness.  MRI brain was done and demonstrated a small L parietal infarct and widespread microhemmorhages, likely due to hypertension.  He was not d/c on ASA because of the GI bleed, but when he f/u with Dr. Leonie Man in 05/2015, he was on ASA .  Dr. Leonie Man encouraged the patient to d/c tobacco.  He states that he has not been able to do that.  Since that time, the patient presented to [name], NP on 11/27/15 to c/o worsening RLE weakness. Wife states that it has been going on for 6-7 months and it is getting weaker.   A CT of the brain was done on 11/27/15 that I had the opportunity to review.  When c/t the 03/2015 exam, there was a new BG infarct on the right.  Echo and carotid u/s are pending.  Echo is scheduled for 12/12/15.  Per PCP notes, pts BP was uncontrolled at visit with her and pt reported he had not taken med.   Pt was back on ASA and was changed to Plavix.    ALLERGIES:  No Known Allergies  CURRENT MEDICATIONS:  Outpatient Encounter Prescriptions as of 12/04/2015  Medication Sig  . atorvastatin (LIPITOR) 80 MG tablet Take 1 tablet (80 mg total) by mouth daily at 6 PM.  . carvedilol (COREG) 3.125 MG tablet TAKE 1 TABLET BY MOUTH TWICE A DAY WITH A MEAL  . clopidogrel (PLAVIX) 75 MG tablet Take 1 tablet (75 mg total) by mouth daily.  [name] Kitchen loratadine (CLARITIN) 10 MG tablet Take 1 tablet (10 mg total)  by mouth every other day.  . Nutritional Supplements (FEEDING SUPPLEMENT, NEPRO CARB STEADY,) LIQD Take 237 mLs by mouth 3 (three) times daily between meals.  . nitroGLYCERIN (NITROSTAT) 0.4 MG SL tablet Place 1 tablet (0.4 mg total) under the tongue every 5 (five) minutes as needed for chest pain (up to 3 doses). (Patient not taking: Reported on 12/04/2015)  . [DISCONTINUED] carvedilol (COREG) 3.125 MG tablet Take 1 tablet (3.125 mg total) by mouth 2 (two) times daily with a meal.   No facility-administered encounter medications on file as of 12/04/2015.     PAST MEDICAL HISTORY:   Past Medical History:  Diagnosis Date  . 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 Anemia, iron deficiency 03/24/2011   a. Recurrent GI bleed, tx with periodic iron infusions.  . 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 AVM (arteriovenous malformation) of duodenum, acquired    egds in 01/2012, 04/2011  . BPH (benign prostatic hyperplasia)   . CAD (coronary artery disease)    a. Cath 09/2012: moderate borderline CAD in mid LAD/small diagonal branch, mild RCA stenosis, to be managed medically   . Chronic lower back pain   . Diabetic peripheral neuropathy ([place]) 2014   foot pain.  . Fatty liver    on CT  of 11/2010  . GERD (gastroesophageal reflux disease)   . GI bleed    a. Recurrent GI bleed, tx with IV iron. b. Per heme notes - likely AVMs 04/2012 (tx with cauterization several months ago).  . Hematuria    a. Urology note scan from 07/2012: cystoscopy without evidence for bladder lesion, only lateral hypertrophy of posterior urethra, bladder impression from BPH. b. Pt states he had "some tests" scheduled for later in July 2014.  . High cholesterol   . Hypertension   . 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 Orthostatic hypotension    a. Tx with florinef.  . Peripheral neuropathy ([place])   . Polyp, colonic    Colonoscopy 01/2012 "benign" polyp  . Prostate cancer ([place])   . Sleep apnea    "had  mask; couldn't sleep in it" (09/20/2012)  . Smoker   . Stage III chronic kidney disease    a. Stage 3 (DM with complications ->CKD, peripheral neuropathy).  . Stroke ([place])    x 2  . Type II diabetes mellitus ([place])    a. Dx 1994, uncontrolled.     PAST SURGICAL HISTORY:   Past Surgical History:  Procedure Laterality Date  . ENTEROSCOPY N/A 03/01/2013   Procedure: ENTEROSCOPY;  Surgeon: Inda Castle, MD;  Location: [place];  Service: Endoscopy;  Laterality: N/A;  . GIVENS CAPSULE STUDY N/A 04/05/2015   Procedure: GIVENS CAPSULE STUDY;  Surgeon: Gatha Mayer, MD;  Location: [place];  Service: Endoscopy;  Laterality: N/A;  . INGUINAL HERNIA REPAIR Right 2011  . LEFT HEART CATHETERIZATION WITH CORONARY ANGIOGRAM N/A 09/21/2012   Procedure: LEFT HEART CATHETERIZATION WITH CORONARY ANGIOGRAM;  Surgeon: Peter M Martinique, MD;  Location: [facility] CATH LAB;  Service: Cardiovascular;  Laterality: N/A;  . LUMBAR DISC SURGERY  1980's  . LYMPHADENECTOMY Bilateral 01/19/2013   Procedure: LYMPHADENECTOMY;  Surgeon: Bernestine Amass, MD;  Location: [facility];  Service: Urology;  Laterality: Bilateral;  . ROBOT ASSISTED LAPAROSCOPIC RADICAL PROSTATECTOMY N/A 01/19/2013   Procedure: ROBOTIC ASSISTED LAPAROSCOPIC RADICAL PROSTATECTOMY;  Surgeon: Bernestine Amass, MD;  Location: [facility];  Service: Urology;  Laterality: N/A;  . SHOULDER OPEN ROTATOR CUFF REPAIR Left 1980's    SOCIAL HISTORY:   Social History   Social History  . Marital status: Married    Spouse name: N/A  . Number of children: 2  . Years of education: N/A   Occupational History  . taken out of work due to back    Social History Main Topics  . Smoking status: Current Some Day Smoker    Packs/day: 1.00    Years: 43.00    Types: Cigarettes    Start date: 04/15/1968  . Smokeless tobacco: Never Used     Comment: 07-22--2016  still smoking  . Alcohol use No     Comment: 09/20/2012 "Used to; stopped ~ 2009; never had problem w/it"  . Drug  use: No  . Sexual activity: No   Other Topics Concern  . Not on file   Social History Narrative   Regular exercise: rides horses   Caffeine use: occasionally    FAMILY HISTORY:   Family Status  Relation Status  . Father Deceased  . Mother Deceased  . Maternal Grandmother Deceased  . Maternal Grandfather Deceased  . Paternal Grandmother Deceased  . Paternal Grandfather Deceased  . Brother   . Sister   . Brother   . Sister     ROS:  A complete 10 system review  of systems was obtained and was unremarkable apart from what is mentioned above.  PHYSICAL EXAMINATION:    VITALS:   Vitals:   12/04/15 1315  BP: 126/60  Pulse: 84  SpO2: 96%  Weight: 145 lb (65.8 kg)  Height: 5\' 7"  (1.702 m)    GEN:  Normal appears male in no acute distress.  Appears stated age. HEENT:  Normocephalic, atraumatic. The mucous membranes are moist. The superficial temporal arteries are without ropiness or tenderness. Cardiovascular: Regular rate and rhythm. Lungs: Clear to auscultation bilaterally. Neck/Heme: There are no carotid bruits noted bilaterally.  NEUROLOGICAL: Orientation:  The patient is alert and oriented x 3.  Fund of knowledge is appropriate.  Recent and remote memory intact.  Attention span and concentration normal.  Repeats and names without difficulty. Cranial nerves: There is good facial symmetry. The pupils are equal round and reactive to light bilaterally. Fundoscopic exam is attempted but the disc margins are not well visualized bilaterally.. Extraocular muscles are intact and visual fields are full to confrontational testing. Speech is fluent but mildly dysarthric. Soft palate rises symmetrically and there is no tongue deviation. Hearing is intact to conversational tone. Tone: Tone is good throughout. Sensation: Sensation is intact to light touch and pinprick throughout (facial, trunk, extremities). Vibration is markedly decreased in a distal fashion. There is no extinction  with double simultaneous stimulation. There is no sensory dermatomal level identified. Coordination:  The patient has no difficulty with RAM's or FNF bilaterally. Motor: Strength is 5/5 in the L upper and lower extremities.  Strength is 5/5 in the RUE except finger abductors and strength there is 4/5.  Strength in R leg is 4+/5, except ankle dorsiflexors/plantar flexors and they are 5/5.  Shoulder shrug is equal and symmetric. There is no pronator drift.  There are no fasciculations noted. DTR's: Deep tendon reflexes are 2/4 at the bilateral biceps, triceps, brachioradialis, absent at the bilateral patella and achilles.  Plantar response is upgoing on the right and down on the L Gait and Station: The patient ambulates in a wide based fashion.  He slightly drags the R leg.  Lab Results  Component Value Date   CHOL 174 12/04/2015   HDL 31.90 (L) 12/04/2015   LDLCALC 109 (H) 12/04/2015   LDLDIRECT 117.0 09/25/2014   TRIG 163.0 (H) 12/04/2015   CHOLHDL 5 12/04/2015      IMPRESSION/PLAN  1. Cerebral infarction, right BG.  -We discussed the diagnosis as well as pathophysiology of the disease.  We discussed signs and sx's of stroke and importance of calling 911 immediately should he have any of these.  Patient education was provided.    -we will do an MRI brain.  Echo pending for 9/26.  Carotid has been ordered and is scheduled for 9/26.  -Talked about stroke risk factors which include tobacco use, HTN, hyperlipidemia, previous hx of stroke, age.  Talked about those risk factors which are modifiable.  Talked to him about how to try and successfully wean tobacco.    -Talked about his Plavix, which was changed from ASA this month after new sx's.  Plavix and ASA have about same risk of bleeding but I am concerned given his hx of recurrent GI bleed due to AVM's.  He does see hematology for this and will send note to Dr. Marin Olp asking if okay to take the plavix.  I told patient that I personally was a  little worried about the plavix but he is in a tough place given recurrent  stroke.  -Talked about importance of blood pressure control with a goal <130/80 mm Hg.   -Talked about importance of lipid control and proper diet.  Lipids should be managed intensively, with a goal LDL < 70 mg/dL.  This was rechecked today his LDL was 109.  Lipitor already maxed out at 80 mg.  May consider changing to Crestor.  Talked to him about this and states that PCP prescribes the medication.  Recommend that he ask PCP if appropriate and Dr. Leonie Man can weigh in in a few weeks.    -I counseled the patient on measures to reduce stroke risk, including the importance of medication compliance, risk factor control, exercise, healthy diet, and avoidance of smoking.    -pt is established with Dr. Leonie Man and has a f/u appt at [facility] on 12/18/15.  He will keep that appt and f/u with him regarding stroke and above testing results.    -Much greater than 50% of this visit was spent in counseling and coordinating care.  Total face to face time:  60 min     Cc:  [name], NP

## 2015-12-04 ENCOUNTER — Encounter: Payer: Self-pay | Admitting: Neurology

## 2015-12-04 ENCOUNTER — Ambulatory Visit (INDEPENDENT_AMBULATORY_CARE_PROVIDER_SITE_OTHER): Payer: Medicare Other | Admitting: Family

## 2015-12-04 ENCOUNTER — Ambulatory Visit (INDEPENDENT_AMBULATORY_CARE_PROVIDER_SITE_OTHER): Payer: Medicare Other | Admitting: Neurology

## 2015-12-04 ENCOUNTER — Encounter: Payer: Self-pay | Admitting: Family

## 2015-12-04 VITALS — BP 151/68 | HR 96 | Temp 97.7°F | Resp 16 | Ht 67.0 in | Wt 141.6 lb

## 2015-12-04 VITALS — BP 126/60 | HR 84 | Ht 67.0 in | Wt 145.0 lb

## 2015-12-04 DIAGNOSIS — I10 Essential (primary) hypertension: Secondary | ICD-10-CM | POA: Diagnosis not present

## 2015-12-04 DIAGNOSIS — I639 Cerebral infarction, unspecified: Secondary | ICD-10-CM | POA: Diagnosis not present

## 2015-12-04 DIAGNOSIS — Z9109 Other allergy status, other than to drugs and biological substances: Secondary | ICD-10-CM

## 2015-12-04 DIAGNOSIS — G8191 Hemiplegia, unspecified affecting right dominant side: Secondary | ICD-10-CM

## 2015-12-04 DIAGNOSIS — E785 Hyperlipidemia, unspecified: Secondary | ICD-10-CM

## 2015-12-04 DIAGNOSIS — Z91048 Other nonmedicinal substance allergy status: Secondary | ICD-10-CM | POA: Diagnosis not present

## 2015-12-04 DIAGNOSIS — I6381 Other cerebral infarction due to occlusion or stenosis of small artery: Secondary | ICD-10-CM

## 2015-12-04 DIAGNOSIS — Z720 Tobacco use: Secondary | ICD-10-CM

## 2015-12-04 LAB — LIPID PANEL
CHOLESTEROL: 174 mg/dL (ref 0–200)
HDL: 31.9 mg/dL — ABNORMAL LOW (ref >39.00)
LDL CALC: 109 mg/dL — AB (ref 0–99)
NonHDL: 141.62
TRIGLYCERIDES: 163 mg/dL — AB (ref 0.0–149.0)
Total CHOL/HDL Ratio: 5
VLDL: 32.6 mg/dL (ref 0.0–40.0)

## 2015-12-04 NOTE — Assessment & Plan Note (Signed)
Uncontrolled. He is only taking coreg once daily. Advised pt to increase to bid.

## 2015-12-04 NOTE — Patient Instructions (Addendum)
Begin Nicotine patch which is available over the counter:  Begin with step 2 (14 mg/day) for 6 weeks, followed by step 3 (7 mg/day) for 2 weeks Increase your coreg to twice daily (blood pressure medications). Continue plavix. Keep your upcoming appointment with Dr. Carles Collet.   Complete lab work prior to leaving.  We will check on your physical therapy referral.

## 2015-12-04 NOTE — Patient Instructions (Addendum)
1. You have been referred to Physical Therapy. They will call you directly to schedule an appointment.  Please call [phone] if you do not hear from them.   2. Call us at the end of the week if you don't hear from [place] about your Plavix.

## 2015-12-04 NOTE — Progress Notes (Signed)
Pre visit review using our clinic review tool, if applicable. No additional management support is needed unless otherwise documented below in the visit note. 

## 2015-12-04 NOTE — Progress Notes (Signed)
Subjective:    Patient ID: [name], male    DOB: Aug 08, 1945, 70 y.o.   MRN: [id number]  HPI   [name] is a 70 yr old male who presents today for follow up.  Hyperlipidemia- reports good compliance with atorvastatin.   Lab Results  Component Value Date   CHOL 201 (H) 04/06/2015   HDL 28 (L) 04/06/2015   LDLCALC 132 (H) 04/06/2015   LDLDIRECT 117.0 09/25/2014   TRIG 203 (H) 04/06/2015   CHOLHDL 7.2 04/06/2015    HTN- he reports that he has only been taking carvedilol once a day. BP Readings from Last 3 Encounters:  12/04/15 (!) 151/68  11/27/15 (!) 177/83  11/15/15 135/77   CVA- CT head 11/27/15 noted 1. New right basal ganglia chronic appearing infarct involving the caudate head and lentiform nucleus from 1/17.Reports balance, "seems a little worse."    Tobacco abuse- He is motivated to quit smoking. He reports that he could not afford chantix. He is smoking about 1/2 PPD.  He has never tried the nicotine patch.    Reports ears feel "stopped up." Has had some allergy symptoms recently  Review of Systems See HPI  Past Medical History:  Diagnosis Date  . 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 Anemia, iron deficiency 03/24/2011   a. Recurrent GI bleed, tx with periodic iron infusions.  . 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 AVM (arteriovenous malformation) of duodenum, acquired    egds in 01/2012, 04/2011  . BPH (benign prostatic hyperplasia)   . CAD (coronary artery disease)    a. Cath 09/2012: moderate borderline CAD in mid LAD/small diagonal branch, mild RCA stenosis, to be managed medically   . Chronic lower back pain   . Diabetic peripheral neuropathy ([place]) 2014   foot pain.  . Fatty liver    on CT of 11/2010  . GERD (gastroesophageal reflux disease)   . GI bleed    a. Recurrent GI bleed, tx with IV iron. b. Per heme notes - likely AVMs 04/2012 (tx with cauterization several months ago).  . Hematuria    a. Urology note scan from 07/2012: cystoscopy  without evidence for bladder lesion, only lateral hypertrophy of posterior urethra, bladder impression from BPH. b. Pt states he had "some tests" scheduled for later in July 2014.  . High cholesterol   . Hypertension   . 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 Orthostatic hypotension    a. Tx with florinef.  . Peripheral neuropathy ([place])   . Polyp, colonic    Colonoscopy 01/2012 "benign" polyp  . Prostate cancer ([place])   . Sleep apnea    "had mask; couldn't sleep in it" (09/20/2012)  . Smoker   . Stage III chronic kidney disease    a. Stage 3 (DM with complications ->CKD, peripheral neuropathy).  . Stroke ([place])    x 2  . Type II diabetes mellitus ([place])    a. Dx 1994, uncontrolled.      Social History   Social History  . Marital status: Married    Spouse name: N/A  . Number of children: 2  . Years of education: N/A   Occupational History  . taken out of work due to back    Social History Main Topics  . Smoking status: Current Some Day Smoker    Packs/day: 1.00    Years: 43.00    Types: Cigarettes    Start date: 04/15/1968  . Smokeless tobacco: Never Used  Comment: 07-22--2016  still smoking  . Alcohol use No     Comment: 09/20/2012 "Used to; stopped ~ 2009; never had problem w/it"  . Drug use: No  . Sexual activity: No   Other Topics Concern  . Not on file   Social History Narrative   Regular exercise: rides horses   Caffeine use: occasionally    Past Surgical History:  Procedure Laterality Date  . ENTEROSCOPY N/A 03/01/2013   Procedure: ENTEROSCOPY;  Surgeon: Inda Castle, MD;  Location: [place];  Service: Endoscopy;  Laterality: N/A;  . GIVENS CAPSULE STUDY N/A 04/05/2015   Procedure: GIVENS CAPSULE STUDY;  Surgeon: Gatha Mayer, MD;  Location: [place];  Service: Endoscopy;  Laterality: N/A;  . INGUINAL HERNIA REPAIR Right 2011  . LEFT HEART CATHETERIZATION WITH CORONARY ANGIOGRAM N/A 09/21/2012   Procedure: LEFT HEART  CATHETERIZATION WITH CORONARY ANGIOGRAM;  Surgeon: Peter M Martinique, MD;  Location: [facility] CATH LAB;  Service: Cardiovascular;  Laterality: N/A;  . LUMBAR DISC SURGERY  1980's  . LYMPHADENECTOMY Bilateral 01/19/2013   Procedure: LYMPHADENECTOMY;  Surgeon: Bernestine Amass, MD;  Location: [facility];  Service: Urology;  Laterality: Bilateral;  . ROBOT ASSISTED LAPAROSCOPIC RADICAL PROSTATECTOMY N/A 01/19/2013   Procedure: ROBOTIC ASSISTED LAPAROSCOPIC RADICAL PROSTATECTOMY;  Surgeon: Bernestine Amass, MD;  Location: [facility];  Service: Urology;  Laterality: N/A;  . SHOULDER OPEN ROTATOR CUFF REPAIR Left 1980's    Family History  Problem Relation Age of Onset  . Stroke Father     Died at 91  . Diabetes Mother   . Heart attack Brother     Died at 37  . Diabetes Sister   . Stomach cancer Brother   . Heart attack Sister     No Known Allergies  Current Outpatient Prescriptions on File Prior to Visit  Medication Sig Dispense Refill  . atorvastatin (LIPITOR) 80 MG tablet Take 1 tablet (80 mg total) by mouth daily at 6 PM. 30 tablet 5  . carvedilol (COREG) 3.125 MG tablet Take 1 tablet (3.125 mg total) by mouth 2 (two) times daily with a meal. 60 tablet 3  . clopidogrel (PLAVIX) 75 MG tablet Take 1 tablet (75 mg total) by mouth daily. 30 tablet 3  . loratadine (CLARITIN) 10 MG tablet Take 1 tablet (10 mg total) by mouth every other day. 30 tablet 11  . nitroGLYCERIN (NITROSTAT) 0.4 MG SL tablet Place 1 tablet (0.4 mg total) under the tongue every 5 (five) minutes as needed for chest pain (up to 3 doses). 25 tablet 2  . Nutritional Supplements (FEEDING SUPPLEMENT, NEPRO CARB STEADY,) LIQD Take 237 mLs by mouth 3 (three) times daily between meals. 90 Can 0   No current facility-administered medications on file prior to visit.     BP (!) 151/68 (BP Location: Right Arm, Cuff Size: Normal)   Pulse 96   Temp 97.7 F (36.5 C) (Oral)   Resp 16   Ht 5\' 7"  (1.702 m)   Wt 141 lb 9.6 oz (64.2 kg)   SpO2 100%  Comment: room air  BMI 22.18 kg/m       Objective:   Physical Exam  Constitutional: He is oriented to person, place, and time. He appears well-developed and well-nourished. No distress.  HENT:  Head: Normocephalic and atraumatic.  Right Ear: Tympanic membrane and ear canal normal.  Left Ear: Tympanic membrane and ear canal normal.  Mouth/Throat: No oropharyngeal exudate, posterior oropharyngeal edema or posterior oropharyngeal erythema.  Cardiovascular: Normal  rate and regular rhythm.   No murmur heard. Pulmonary/Chest: Effort normal and breath sounds normal. No respiratory distress. He has no wheezes. He has no rales.  Musculoskeletal: He exhibits no edema.  Neurological: He is alert and oriented to person, place, and time. No cranial nerve deficit.  Wide based gait  Skin: Skin is warm and dry.  Psychiatric: He has a normal mood and affect. His behavior is normal. Thought content normal.          Assessment & Plan:

## 2015-12-04 NOTE — Assessment & Plan Note (Signed)
Advised pt on trial of claritin 10mg  once daily.

## 2015-12-04 NOTE — Assessment & Plan Note (Signed)
Obtain follow up flp.  Adjust statin if LDL not at goal.

## 2015-12-04 NOTE — Assessment & Plan Note (Signed)
Discussed importance of smoking cessation. He could not afford chantix. Would like to try nicotine patch. Advised him to begin following dosing plan as outlined in AVS.

## 2015-12-04 NOTE — Assessment & Plan Note (Addendum)
ASA was changed to plavix. The following orders are pending:  Modified Barium Swallow Carotid duplex 2D echo.   PT referral  EKG is performed today and personally reviewed- notes NSR with few PAC's.   He is scheduled to see Dr. Carles Collet this afternoon.

## 2015-12-05 ENCOUNTER — Telehealth: Payer: Self-pay | Admitting: Neurology

## 2015-12-05 ENCOUNTER — Telehealth: Payer: Self-pay | Admitting: Family

## 2015-12-05 DIAGNOSIS — E785 Hyperlipidemia, unspecified: Secondary | ICD-10-CM

## 2015-12-05 MED ORDER — ROSUVASTATIN CALCIUM 20 MG PO TABS: 20.0000 mg | ORAL_TABLET | Freq: Every day | ORAL | 3 refills | Status: DC

## 2015-12-05 NOTE — Progress Notes (Signed)
Jade, Dr. Marin Olp kindly provided his expertise and opinion and while he agrees that pt is in a tough spot, he believes probably better to keep him on Plavix.  Said that he will likely have GI bleeding with or without plavix.  Let pt know okay to continue and he has appt with Dr. Leonie Man in few weeks.

## 2015-12-05 NOTE — Telephone Encounter (Signed)
Cholesterol is above goal. D/c atorvastatin, begin crestor. Repeat flp in 6 weeks, dx hyperlipidemia.

## 2015-12-05 NOTE — Telephone Encounter (Signed)
Patient made aware that Dr. Carles Collet spoke with Dr. Marin Olp and he was advised to remain on Plavix. He will call with any questions.

## 2015-12-07 NOTE — Telephone Encounter (Signed)
Left message for pt to return my call.

## 2015-12-11 ENCOUNTER — Ambulatory Visit (HOSPITAL_COMMUNITY)
Admission: RE | Admit: 2015-12-11 | Discharge: 2015-12-11 | Disposition: A | Discharge status: Home / Self Care | Payer: Medicare Other | Source: Ambulatory Visit | Attending: Cardiovascular Disease | Admitting: Cardiovascular Disease

## 2015-12-11 ENCOUNTER — Ambulatory Visit: Payer: Medicare Other | Admitting: Family

## 2015-12-11 ENCOUNTER — Other Ambulatory Visit: Payer: Self-pay

## 2015-12-11 ENCOUNTER — Ambulatory Visit (HOSPITAL_BASED_OUTPATIENT_CLINIC_OR_DEPARTMENT_OTHER): Payer: Medicare Other

## 2015-12-11 DIAGNOSIS — I130 Hypertensive heart and chronic kidney disease with heart failure and stage 1 through stage 4 chronic kidney disease, or unspecified chronic kidney disease: Secondary | ICD-10-CM | POA: Insufficient documentation

## 2015-12-11 DIAGNOSIS — I350 Nonrheumatic aortic (valve) stenosis: Secondary | ICD-10-CM | POA: Insufficient documentation

## 2015-12-11 DIAGNOSIS — E1142 Type 2 diabetes mellitus with diabetic polyneuropathy: Secondary | ICD-10-CM | POA: Diagnosis not present

## 2015-12-11 DIAGNOSIS — E785 Hyperlipidemia, unspecified: Secondary | ICD-10-CM | POA: Insufficient documentation

## 2015-12-11 DIAGNOSIS — G4730 Sleep apnea, unspecified: Secondary | ICD-10-CM | POA: Diagnosis not present

## 2015-12-11 DIAGNOSIS — I340 Nonrheumatic mitral (valve) insufficiency: Secondary | ICD-10-CM | POA: Insufficient documentation

## 2015-12-11 DIAGNOSIS — Z720 Tobacco use: Secondary | ICD-10-CM | POA: Insufficient documentation

## 2015-12-11 DIAGNOSIS — I5022 Chronic systolic (congestive) heart failure: Secondary | ICD-10-CM

## 2015-12-11 DIAGNOSIS — E1122 Type 2 diabetes mellitus with diabetic chronic kidney disease: Secondary | ICD-10-CM | POA: Insufficient documentation

## 2015-12-11 DIAGNOSIS — I639 Cerebral infarction, unspecified: Secondary | ICD-10-CM

## 2015-12-11 DIAGNOSIS — K760 Fatty (change of) liver, not elsewhere classified: Secondary | ICD-10-CM | POA: Diagnosis not present

## 2015-12-11 DIAGNOSIS — I071 Rheumatic tricuspid insufficiency: Secondary | ICD-10-CM | POA: Insufficient documentation

## 2015-12-11 DIAGNOSIS — Z8673 Personal history of transient ischemic attack (TIA), and cerebral infarction without residual deficits: Secondary | ICD-10-CM | POA: Diagnosis not present

## 2015-12-11 DIAGNOSIS — I6523 Occlusion and stenosis of bilateral carotid arteries: Secondary | ICD-10-CM | POA: Insufficient documentation

## 2015-12-11 DIAGNOSIS — I2510 Atherosclerotic heart disease of native coronary artery without angina pectoris: Secondary | ICD-10-CM | POA: Insufficient documentation

## 2015-12-11 DIAGNOSIS — N189 Chronic kidney disease, unspecified: Secondary | ICD-10-CM | POA: Diagnosis not present

## 2015-12-11 NOTE — Telephone Encounter (Signed)
Notified pt and he voices understanding. Lab appt scheduled for 01/22/16 at 8am. Future order entered. Pt asked to r/s today's appt due to other scheduling conflicts. R/S for next Tuesday (12/18/15) at 8am.

## 2015-12-12 ENCOUNTER — Other Ambulatory Visit (HOSPITAL_BASED_OUTPATIENT_CLINIC_OR_DEPARTMENT_OTHER): Payer: Medicare Other

## 2015-12-12 ENCOUNTER — Encounter: Payer: Self-pay | Admitting: Family

## 2015-12-13 ENCOUNTER — Ambulatory Visit: Payer: Medicare Other | Attending: Neurology | Admitting: Physical Therapy

## 2015-12-13 ENCOUNTER — Ambulatory Visit (HOSPITAL_BASED_OUTPATIENT_CLINIC_OR_DEPARTMENT_OTHER): Payer: Medicare Other | Admitting: Family

## 2015-12-13 ENCOUNTER — Encounter: Payer: Self-pay | Admitting: Physical Therapy

## 2015-12-13 ENCOUNTER — Encounter: Payer: Self-pay | Admitting: Family

## 2015-12-13 ENCOUNTER — Telehealth: Payer: Self-pay | Admitting: *Deleted

## 2015-12-13 ENCOUNTER — Other Ambulatory Visit (HOSPITAL_BASED_OUTPATIENT_CLINIC_OR_DEPARTMENT_OTHER): Payer: Medicare Other

## 2015-12-13 ENCOUNTER — Ambulatory Visit (HOSPITAL_BASED_OUTPATIENT_CLINIC_OR_DEPARTMENT_OTHER): Payer: Medicare Other

## 2015-12-13 VITALS — BP 164/74 | HR 89 | Temp 97.3°F | Resp 18 | Ht 67.0 in | Wt 144.0 lb

## 2015-12-13 DIAGNOSIS — R262 Difficulty in walking, not elsewhere classified: Secondary | ICD-10-CM | POA: Diagnosis not present

## 2015-12-13 DIAGNOSIS — D500 Iron deficiency anemia secondary to blood loss (chronic): Secondary | ICD-10-CM

## 2015-12-13 DIAGNOSIS — E119 Type 2 diabetes mellitus without complications: Secondary | ICD-10-CM

## 2015-12-13 DIAGNOSIS — D631 Anemia in chronic kidney disease: Secondary | ICD-10-CM

## 2015-12-13 DIAGNOSIS — K922 Gastrointestinal hemorrhage, unspecified: Secondary | ICD-10-CM

## 2015-12-13 DIAGNOSIS — Z992 Dependence on renal dialysis: Secondary | ICD-10-CM

## 2015-12-13 DIAGNOSIS — K5520 Angiodysplasia of colon without hemorrhage: Secondary | ICD-10-CM

## 2015-12-13 DIAGNOSIS — R260 Ataxic gait: Secondary | ICD-10-CM

## 2015-12-13 DIAGNOSIS — N179 Acute kidney failure, unspecified: Secondary | ICD-10-CM

## 2015-12-13 DIAGNOSIS — N189 Chronic kidney disease, unspecified: Principal | ICD-10-CM

## 2015-12-13 DIAGNOSIS — Q2733 Arteriovenous malformation of digestive system vessel: Secondary | ICD-10-CM

## 2015-12-13 DIAGNOSIS — D509 Iron deficiency anemia, unspecified: Secondary | ICD-10-CM

## 2015-12-13 DIAGNOSIS — N184 Chronic kidney disease, stage 4 (severe): Secondary | ICD-10-CM

## 2015-12-13 DIAGNOSIS — N186 End stage renal disease: Secondary | ICD-10-CM

## 2015-12-13 LAB — CMP (CANCER CENTER ONLY)
ALT(SGPT): 17 U/L (ref 10–47)
AST: 18 U/L (ref 11–38)
Albumin: 3 g/dL — ABNORMAL LOW (ref 3.3–5.5)
Alkaline Phosphatase: 119 U/L — ABNORMAL HIGH (ref 26–84)
BUN: 19 mg/dL (ref 7–22)
CHLORIDE: 102 meq/L (ref 98–108)
CO2: 32 mEq/L (ref 18–33)
Calcium: 9.2 mg/dL (ref 8.0–10.3)
Creat: 5.4 mg/dl (ref 0.6–1.2)
Glucose, Bld: 197 mg/dL — ABNORMAL HIGH (ref 73–118)
POTASSIUM: 3.8 meq/L (ref 3.3–4.7)
Sodium: 139 mEq/L (ref 128–145)
TOTAL PROTEIN: 7.1 g/dL (ref 6.4–8.1)
Total Bilirubin: 0.6 mg/dl (ref 0.20–1.60)

## 2015-12-13 LAB — CBC WITH DIFFERENTIAL (CANCER CENTER ONLY)
BASO#: 0.1 10*3/uL (ref 0.0–0.2)
BASO%: 0.8 % (ref 0.0–2.0)
EOS ABS: 0.2 10*3/uL (ref 0.0–0.5)
EOS%: 3.1 % (ref 0.0–7.0)
HEMATOCRIT: 25.5 % — AB (ref 38.7–49.9)
HEMOGLOBIN: 8.6 g/dL — AB (ref 13.0–17.1)
LYMPH#: 0.8 10*3/uL — AB (ref 0.9–3.3)
LYMPH%: 13 % — ABNORMAL LOW (ref 14.0–48.0)
MCH: 33.1 pg (ref 28.0–33.4)
MCHC: 33.7 g/dL (ref 32.0–35.9)
MCV: 98 fL (ref 82–98)
MONO#: 0.8 10*3/uL (ref 0.1–0.9)
MONO%: 12.1 % (ref 0.0–13.0)
NEUT#: 4.6 10*3/uL (ref 1.5–6.5)
NEUT%: 71 % (ref 40.0–80.0)
PLATELETS: 318 10*3/uL (ref 145–400)
RBC: 2.6 10*6/uL — ABNORMAL LOW (ref 4.20–5.70)
RDW: 14.9 % (ref 11.1–15.7)
WBC: 6.5 10*3/uL (ref 4.0–10.0)

## 2015-12-13 LAB — IRON AND TIBC
%SAT: 30 % (ref 20–55)
IRON: 61 ug/dL (ref 42–163)
TIBC: 205 ug/dL (ref 202–409)
UIBC: 144 ug/dL (ref 117–376)

## 2015-12-13 LAB — CHCC SATELLITE - SMEAR

## 2015-12-13 LAB — FERRITIN: FERRITIN: 376 ng/mL — AB (ref 22–316)

## 2015-12-13 MED ORDER — SODIUM CHLORIDE 0.9 % IV SOLN
510.0000 mg | Freq: Once | INTRAVENOUS | Status: AC
  Administered 2015-12-13: 510 mg via INTRAVENOUS
  Filled 2015-12-13: qty 17

## 2015-12-13 MED ORDER — SODIUM CHLORIDE 0.9 % IV SOLN
Freq: Once | INTRAVENOUS | Status: AC
  Administered 2015-12-13: 10:00:00 via INTRAVENOUS

## 2015-12-13 NOTE — Patient Instructions (Signed)

## 2015-12-13 NOTE — Progress Notes (Signed)
Hematology and Oncology Follow Up Visit  ASHELY PERSSON [id number] 08-15-1945 70 y.o. 12/13/2015   Principle Diagnosis:  1. 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 IV iron as indicated 2. Supportive blood transfusion as needed     Interim History:  Mr. Bruinsma is here today for a follow-up. He is doing well but experiencing fatigue with exertion. His Hgb today is 8.6 with an MCV of 98.  He has occasional palpitations. No chest pain. He has a dry cough and chills as well. Lung sound clear throughout.  No fever, n/v, rash, dizziness, SOB, abdominal pain or changes in bowel or bladder habits. He is still making urine. He has still experiences constipation off and on.  He has not noticed any blood in his stool in the last week or so. No episodes of bruising or petechiae.  No lymphadenopathy found on exam.  The neuropathy in his feet is worsening since being taken off of Neurontin. We will have him try taking a B complex vitamin and see if this helps. No swelling or tenderness in his extremities.  He has maintained a good appetite and is staying well hydrated. His weight is stable.  He also states that his blood sugars have been controlled.  He starts PT today to help strengthen his right leg. He has had no recent falls or syncopal episodes.   Of note: Patient no longer receiving Aranesp due to nonhemorrhagic stroke  Medications:    Medication List       Accurate as of 12/13/15  9:22 AM. Always use your most recent med list.          carvedilol 3.125 MG tablet Commonly known as:  COREG TAKE 1 TABLET BY MOUTH TWICE A DAY WITH A MEAL   clopidogrel 75 MG tablet Commonly known as:  PLAVIX Take 1 tablet (75 mg total) by mouth daily.   feeding supplement (NEPRO CARB STEADY) Liqd Take 237 mLs by mouth 3  (three) times daily between meals.   loratadine 10 MG tablet Commonly known as:  CLARITIN Take 1 tablet (10 mg total) by mouth every other day.   nitroGLYCERIN 0.4 MG SL tablet Commonly known as:  NITROSTAT Place 1 tablet (0.4 mg total) under the tongue every 5 (five) minutes as needed for chest pain (up to 3 doses).   rosuvastatin 20 MG tablet Commonly known as:  CRESTOR Take 1 tablet (20 mg total) by mouth daily.       Allergies: No Known Allergies  Past Medical History, Surgical history, Social history, and Family History were reviewed and updated.  Review of Systems: All other 10 point review of systems is negative.   Physical Exam:  height is 5\' 7"  (1.702 m) and weight is 144 lb (65.3 kg). His oral temperature is 97.3 F (36.3 C). His blood pressure is 164/74 (abnormal) and his pulse is 89. His respiration is 18.   Wt Readings from Last 3 Encounters:  12/13/15 144 lb (65.3 kg)  12/04/15 145 lb (65.8 kg)  12/04/15 141 lb 9.6 oz (64.2 kg)    Ocular: Sclerae unicteric, pupils equal, round and reactive to light Ear-nose-throat: Oropharynx clear, dentition fair Lymphatic: No cervical supraclavicular or axillary adenopathy Lungs no rales or rhonchi, good excursion bilaterally Heart regular rate and rhythm, no murmur appreciated Abd soft, nontender, positive bowel  sounds, no liver or spleen tip palpated on exam, no fluid wave MSK no focal spinal tenderness, no joint edema Neuro: non-focal, well-oriented, appropriate affect  Lab Results  Component Value Date   WBC 6.5 12/13/2015   HGB 8.6 (L) 12/13/2015   HCT 25.5 (L) 12/13/2015   MCV 98 12/13/2015   PLT 318 12/13/2015   Lab Results  Component Value Date   FERRITIN 538 (H) 11/15/2015   IRON 59 11/15/2015   TIBC 193 (L) 11/15/2015   UIBC 133 11/15/2015   IRONPCTSAT 31 11/15/2015   Lab Results  Component Value Date   RETICCTPCT 3.0 (H) 03/15/2015   RBC 2.60 (L) 12/13/2015   RETICCTABS 76.5 03/15/2015   No  results found for: KPAFRELGTCHN, LAMBDASER, KAPLAMBRATIO No results found for: IGGSERUM, IGA, IGMSERUM No results found for: Odetta Pink, SPEI   Chemistry      Component Value Date/Time   NA 141 11/15/2015 0917   K 4.0 11/15/2015 0917   CL 111 10/08/2015 0942   CL 106 07/27/2014 0821   CO2 27 11/15/2015 0917   BUN 18.7 11/15/2015 0917   CREATININE 5.3 (HH) 11/15/2015 0917      Component Value Date/Time   CALCIUM 8.9 11/15/2015 0917   ALKPHOS 117 11/15/2015 0917   AST 12 11/15/2015 0917   ALT <9 11/15/2015 0917   BILITOT <0.30 11/15/2015 0917     Impression and Plan: Mr. Shuford is 71 year old gentleman with multifactorial anemia (iron deficiency due to chronic GI bleed - AVM's/chronic renal insufficiency due to uncontrolled diabetes - on hemodialysis). He is doing nicely with dialysis and has not had any issue with fluid overload.  He has had some intermittent fatigue with exertion that is unchanged but is "feeling good" otherwise. Hgb continues to slowly drop. He is 8.6 at this time with an MCV of 98. We will hold off on transfusing him at this time but will give iron today. We will plan to see him back in 3 weeks for repeat labs, follow-up and infusion if needed.  He will contact our office with any questions, concerns or signs of anemia. We can certainly see him sooner if need be.   Eliezer Bottom, NP 9/28/20179:22 AM

## 2015-12-13 NOTE — Telephone Encounter (Signed)
Critical Value Creatinine 5.4 Laverna Peace NP notified. No orders at this time.

## 2015-12-13 NOTE — Therapy (Signed)
[address] [place], [place], [address] Phone: [phone]   Fax:  [phone]  Physical Therapy Evaluation  Patient Details  Name: [name] MRN: [id number] Date of Birth: [birth year] Referring Provider: [name]  Encounter Date: 12/13/2015      PT End of Session - 12/13/15 1632    Visit Number 1   Date for PT Re-Evaluation 02/12/16   PT Start Time [id number]   PT Stop Time 1635   PT Time Calculation (min) 56 min   Activity Tolerance Patient tolerated treatment well   Behavior During Therapy [facility] for tasks assessed/performed      Past Medical History:  Diagnosis Date  . 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 Anemia, iron deficiency 03/24/2011   a. Recurrent GI bleed, tx with periodic iron infusions.  . 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 AVM (arteriovenous malformation) of duodenum, acquired    egds in 01/2012, 04/2011  . BPH (benign prostatic hyperplasia)   . CAD (coronary artery disease)    a. Cath 09/2012: moderate borderline CAD in mid LAD/small diagonal branch, mild RCA stenosis, to be managed medically   . Chronic lower back pain   . Diabetic peripheral neuropathy ([place]) 2014   foot pain.  . Fatty liver    on CT of 11/2010  . GERD (gastroesophageal reflux disease)   . GI bleed    a. Recurrent GI bleed, tx with IV iron. b. Per heme notes - likely AVMs 04/2012 (tx with cauterization several months ago).  . Hematuria    a. Urology note scan from 07/2012: cystoscopy without evidence for bladder lesion, only lateral hypertrophy of posterior urethra, bladder impression from BPH. b. Pt states he had "some tests" scheduled for later in July 2014.  . High cholesterol   . Hypertension   . 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 Orthostatic hypotension    a. Tx with florinef.  . Peripheral neuropathy ([place])   . Polyp, colonic    Colonoscopy 01/2012 "benign" polyp  .  Prostate cancer ([place])   . Sleep apnea    "had mask; couldn't sleep in it" (09/20/2012)  . Smoker   . Stage III chronic kidney disease    a. Stage 3 (DM with complications ->CKD, peripheral neuropathy).  . Stroke ([place])    x 2  . Type II diabetes mellitus ([place])    a. Dx 1994, uncontrolled.     Past Surgical History:  Procedure Laterality Date  . ENTEROSCOPY N/A 03/01/2013   Procedure: ENTEROSCOPY;  Surgeon: Inda Castle, MD;  Location: [place];  Service: Endoscopy;  Laterality: N/A;  . GIVENS CAPSULE STUDY N/A 04/05/2015   Procedure: GIVENS CAPSULE STUDY;  Surgeon: Gatha Mayer, MD;  Location: [place];  Service: Endoscopy;  Laterality: N/A;  . INGUINAL HERNIA REPAIR Right 2011  . LEFT HEART CATHETERIZATION WITH CORONARY ANGIOGRAM N/A 09/21/2012   Procedure: LEFT HEART CATHETERIZATION WITH CORONARY ANGIOGRAM;  Surgeon: Peter M Martinique, MD;  Location[facility] C[facility] LAB;  Service: Cardiovascular;  Laterality: N/A;  . LUMBAR DISC SURGERY  1980's  . LYMPHADENECTOMY Bilateral 01/19/2013   Procedure: LYMPHADENECTOMY;  Surgeon: Bernestine Amass, MD;  Location: [facility];  Service: Urology;  Laterality: Bilateral;  . ROBOT ASSISTED LAPAROSCOPIC RADICAL PROSTATECTOMY N/A 01/19/2013   Procedure: ROBOTIC ASSISTED LAPAROSCOPIC RADICAL PROSTATECTOMY;  Surgeon: Bernestine Amass, MD;  Location: [facility];  Service: Urology;  Laterality: N/A;  . SHOULDER OPEN ROTATOR CUFF  REPAIR Left 1980's    There were no vitals filed for this visit.       Subjective Assessment - 12/13/15 1546    Subjective Patient reports that he has had 2 strokes, he is unsure of the most recent one but reports about a year ago.  He reports that he has had difficulty walking and c/o weakness and difficulty controlling the right leg.  Patient seems to be a poor historian and is unsure of some of his health.  He reports some difficulty with speech and difficulty with writing which is why he was sent here.  I was able to look through the  imaging studies and it looks like MRI shows a stroke back in January of this year and the most recent study was 11/27/15 that showed a new area of ischemia.   Pertinent History he is on dialysis 3x/week due to ESRD   Limitations Walking;Lifting;House hold activities   Patient Stated Goals walk better, ride a horse   Currently in Pain? No/denies            [facility] PT Assessment - 12/13/15 0001      Assessment   Medical Diagnosis difficulty walking   Referring Provider [name]   Onset Date/Surgical Date 11/27/15   Prior Therapy no     Precautions   Precautions None     Balance Screen   Has the patient fallen in the past 6 months No   Has the patient had a decrease in activity level because of a fear of falling?  No   Is the patient reluctant to leave their home because of a fear of falling?  No     Home Environment   Additional Comments was able to do housework, yardwork, feed horses     Prior Function   Level of Independence Independent   Vocation Retired   Leisure no exercises     Posture/Postural Control   Posture Comments fwd head, rounded shoulders     ROM / Strength   AROM / PROM / Strength AROM;Strength     Strength   Overall Strength Comments right shoulder and elbow 4-/5, left shoulder 4+/5, right hip 4-/5, right knee 3+/5, right PF 3+/5, DF 4-/5, left LE 4/5     Flexibility   Soft Tissue Assessment /Muscle Length --  tight HS, calves and ITB     Palpation   Palpation comment tender in the right thigh , c/o pain iwth palpation of the right quad rated a 5/10     Ambulation/Gait   Gait Comments no device, slow, very wide BOS, ataxic LE's     Standardized Balance Assessment   Standardized Balance Assessment Timed Up and Go Test;Berg Balance Test     Berg Balance Test   Sit to Stand Able to stand without using hands and stabilize independently   Standing Unsupported Able to stand safely 2 minutes   Sitting with Back Unsupported but Feet Supported  on Floor or Stool Able to sit safely and securely 2 minutes   Stand to Sit Sits safely with minimal use of hands   Transfers Able to transfer safely, minor use of hands   Standing Unsupported with Eyes Closed Able to stand 10 seconds with supervision   Standing Ubsupported with Feet Together Able to place feet together independently and stand for 1 minute with supervision   From Standing, Reach Forward with Outstretched Arm Can reach confidently >25 cm (10")   From Standing Position, Pick up Object  from Floor Able to pick up shoe safely and easily   From Standing Position, Turn to Look Behind Over each Shoulder Turn sideways only but maintains balance   Turn 360 Degrees Able to turn 360 degrees safely but slowly   Standing Unsupported, Alternately Place Feet on Step/Stool Able to stand independently and safely and complete 8 steps in 20 seconds   Standing Unsupported, One Foot in Front Able to take small step independently and hold 30 seconds   Standing on One Leg Able to lift leg independently and hold equal to or more than 3 seconds   Total Score 46     Timed Up and Go Test   Normal TUG (seconds) 20                   [facility] Adult PT Treatment/Exercise - 12/13/15 0001      Exercises   Exercises Knee/Hip     Knee/Hip Exercises: Aerobic   Nustep level 5 x 5 minutes     Knee/Hip Exercises: Machines for Strengthening   Cybex Knee Extension 5# 2x10   Cybex Knee Flexion 25# 2x10                PT Education - 12/13/15 1631    Education provided Yes   Education Details gave HEP for 4 way kicks, side stepping, toe walks and heel walks   Person(s) Educated Patient   Methods Explanation;Handout   Comprehension Verbalized understanding;Returned demonstration;Verbal cues required          PT Short Term Goals - 12/13/15 1645      PT SHORT TERM GOAL #1   Title independent with initial HEP   Time 2   Period Weeks   Status New           PT Long Term Goals -  12/13/15 1646      PT LONG TERM GOAL #1   Title Patient will understand fall risk inside and outside the home   Time 8   Period Weeks   Status New     PT LONG TERM GOAL #2   Title decrease TUG time to 15 seconds   Time 8   Period Weeks   Status New     PT LONG TERM GOAL #3   Title increase Berg score to 50/56   Time 8   Period Weeks   Status New     PT LONG TERM GOAL #4   Title patient will be able to walk around our building without difficulty   Time 8   Period Weeks   Status New     PT LONG TERM GOAL #5   Title increase strength of the right LE to 4/5   Time 8   Period Weeks   Status New               Plan - 12/13/15 1636    Clinical Impression Statement Patient had a stroke in January, he reports a 4 day hospital stay, but reports he had no therapy.  He reports he had a recent MRI that showed a new infarct compared to January.  He has very ataxic gait, he denies any falls.  He has weakness on the right side compared to the left side, he had some difficulty with coordination of the right UR.  He reports that prior to January he was doing all of his yardwork and riding horses.  He is on dialysis 3x/week.  TUG was 20 seconds and Merrilee Jansky  was 46/56   Rehab Potential Good   PT Frequency 2x / week   PT Duration 8 weeks   PT Treatment/Interventions ADLs/Self Care Home Management;Functional mobility training;Stair training;Gait training;Therapeutic activities;Therapeutic exercise;Balance training;Neuromuscular re-education;Patient/family education;Passive range of motion;Manual techniques   PT Next Visit Plan add balance and strength exercises   Consulted and Agree with Plan of Care Patient      Patient will benefit from skilled therapeutic intervention in order to improve the following deficits and impairments:  Abnormal gait, Decreased activity tolerance, Decreased balance, Decreased mobility, Decreased endurance, Decreased coordination, Decreased range of motion,  Decreased strength, Difficulty walking, Impaired flexibility, Postural dysfunction, Improper body mechanics, Impaired UE functional use  Visit Diagnosis: Difficulty in walking, not elsewhere classified - Plan: PT plan of care cert/re-cert  Ataxic gait - Plan: PT plan of care cert/re-cert      G-Codes - [id number]    Functional Assessment Tool Used foto 54% limitation   Functional Limitation Mobility: Walking and moving around   Mobility: Walking and Moving Around Current Status [phone]) At least 40 percent but less than 60 percent impaired, limited or restricted   Mobility: Walking and Moving Around Goal Status [phone]) At least 40 percent but less than 60 percent impaired, limited or restricted       Problem List Patient Active Problem List   Diagnosis Date Noted  . Environmental allergies 12/04/2015  . Allergic rhinitis 10/28/2015  . ESRD on dialysis ([place]) 10/28/2015  . 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 Malnutrition of moderate degree 04/05/2015  . Acute renal failure superimposed on stage 4 chronic kidney disease ([place]) 04/04/2015  . Benign paroxysmal positional vertigo 09/26/2014  . Right leg weakness 02/28/2014  . Metatarsalgia of both feet 09/13/2013  . Equinus deformity of foot, acquired 09/13/2013  . Diabetes type 2, uncontrolled ([place]) 04/27/2013  . GERD (gastroesophageal reflux disease) 03/24/2013  . GI bleed 02/28/2013  . CVA (cerebral infarction) 11/17/2012  . Dizziness and giddiness 10/31/2012  . Prostate cancer ([place]) 10/27/2012  . Diabetic neuropathy ([place]) 10/04/2012  . CAD (coronary artery disease) 10/04/2012  . Hoarseness 06/26/2012  . Hematuria 05/24/2012  . Tinea pedis 05/24/2012  . Back pain 02/23/2012  . Erectile dysfunction 02/03/2012  . Type 2 diabetes, uncontrolled, with neuropathy ([place]) 02/01/2012  . Essential hypertension 02/01/2012  . Tobacco use 02/01/2012  . Hyperlipidemia 02/01/2012  . Anemia of renal disease 05/14/2011     Sumner Boast., PT 12/13/2015, 4:52 PM  [address] [place], [place], [address] Phone: [phone]   Fax:  [phone]  Name: PHARIS KRUPPA MRN: [id number] Date of Birth: 05-26-1945

## 2015-12-14 ENCOUNTER — Ambulatory Visit: Payer: No Typology Code available for payment source | Admitting: Nurse Practitioner

## 2015-12-14 LAB — RETICULOCYTES: Reticulocyte Count: 3.5 % — ABNORMAL HIGH (ref 0.6–2.6)

## 2015-12-17 ENCOUNTER — Ambulatory Visit (HOSPITAL_COMMUNITY)
Admission: RE | Admit: 2015-12-17 | Discharge: 2015-12-17 | Disposition: A | Discharge status: Home / Self Care | Payer: Medicare Other | Source: Ambulatory Visit | Attending: Hematology & Oncology | Admitting: Hematology & Oncology

## 2015-12-18 ENCOUNTER — Ambulatory Visit: Payer: Medicare Other | Admitting: Family

## 2015-12-18 ENCOUNTER — Ambulatory Visit: Payer: No Typology Code available for payment source | Admitting: Nurse Practitioner

## 2015-12-18 ENCOUNTER — Ambulatory Visit: Payer: Medicare Other | Attending: Neurology | Admitting: Physical Therapy

## 2015-12-18 DIAGNOSIS — R260 Ataxic gait: Secondary | ICD-10-CM | POA: Insufficient documentation

## 2015-12-18 DIAGNOSIS — R262 Difficulty in walking, not elsewhere classified: Secondary | ICD-10-CM | POA: Insufficient documentation

## 2015-12-19 ENCOUNTER — Encounter: Payer: Self-pay | Admitting: Nurse Practitioner

## 2015-12-19 ENCOUNTER — Ambulatory Visit (HOSPITAL_COMMUNITY): Payer: Medicare Other

## 2015-12-20 ENCOUNTER — Ambulatory Visit (HOSPITAL_COMMUNITY)
Admission: RE | Admit: 2015-12-20 | Discharge: 2015-12-20 | Disposition: A | Discharge status: Home / Self Care | Payer: Medicare Other | Source: Ambulatory Visit | Attending: Family | Admitting: Family

## 2015-12-20 ENCOUNTER — Encounter: Payer: Self-pay | Admitting: Physical Therapy

## 2015-12-20 ENCOUNTER — Ambulatory Visit: Payer: Medicare Other | Admitting: Physical Therapy

## 2015-12-20 DIAGNOSIS — R260 Ataxic gait: Secondary | ICD-10-CM | POA: Diagnosis present

## 2015-12-20 DIAGNOSIS — R1311 Dysphagia, oral phase: Secondary | ICD-10-CM | POA: Insufficient documentation

## 2015-12-20 DIAGNOSIS — R262 Difficulty in walking, not elsewhere classified: Secondary | ICD-10-CM

## 2015-12-20 DIAGNOSIS — I639 Cerebral infarction, unspecified: Secondary | ICD-10-CM | POA: Diagnosis present

## 2015-12-20 DIAGNOSIS — R1310 Dysphagia, unspecified: Secondary | ICD-10-CM

## 2015-12-20 NOTE — Therapy (Signed)
[place] [address] [place], [place], [address] Phone: [phone]   Fax:  [phone]  Physical Therapy Treatment  Patient Details  Name: [name] MRN: [id number] Date of Birth: 11-29-1945 Referring Provider: [name]  Encounter Date: 12/20/2015      PT End of Session - 12/20/15 1649    Visit Number 2   PT Start Time 1600   PT Stop Time 1649   PT Time Calculation (min) 49 min   Activity Tolerance Patient tolerated treatment well   Behavior During Therapy [facility] for tasks assessed/performed      Past Medical History:  Diagnosis Date  . 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 Anemia, iron deficiency 03/24/2011   a. Recurrent GI bleed, tx with periodic iron infusions.  . 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 AVM (arteriovenous malformation) of duodenum, acquired    egds in 01/2012, 04/2011  . BPH (benign prostatic hyperplasia)   . CAD (coronary artery disease)    a. Cath 09/2012: moderate borderline CAD in mid LAD/small diagonal branch, mild RCA stenosis, to be managed medically   . Chronic lower back pain   . Diabetic peripheral neuropathy ([place]) 2014   foot pain.  . Fatty liver    on CT of 11/2010  . GERD (gastroesophageal reflux disease)   . GI bleed    a. Recurrent GI bleed, tx with IV iron. b. Per heme notes - likely AVMs 04/2012 (tx with cauterization several months ago).  . Hematuria    a. Urology note scan from 07/2012: cystoscopy without evidence for bladder lesion, only lateral hypertrophy of posterior urethra, bladder impression from BPH. b. Pt states he had "some tests" scheduled for later in July 2014.  . High cholesterol   . Hypertension   . 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 Orthostatic hypotension    a. Tx with florinef.  . Peripheral neuropathy ([place])   . Polyp, colonic    Colonoscopy 01/2012 "benign" polyp  . Prostate cancer ([place])   . Sleep apnea    "had mask; couldn't sleep in it" (09/20/2012)  . Smoker   . Stage III chronic kidney disease    a. Stage 3 (DM with complications ->CKD, peripheral neuropathy).  . Stroke ([place])    x 2  . Type II diabetes mellitus ([place])    a. Dx 1994, uncontrolled.     Past Surgical History:  Procedure Laterality Date  . ENTEROSCOPY N/A 03/01/2013   Procedure: ENTEROSCOPY;  Surgeon: Inda Castle, MD;  Location: [facility];  Service: Endoscopy;  Laterality: N/A;  . GIVENS CAPSULE STUDY N/A 04/05/2015   Procedure: GIVENS CAPSULE STUDY;  Surgeon: Gatha Mayer, MD;  Location: [facility];  Service: Endoscopy;  Laterality: N/A;  . INGUINAL HERNIA REPAIR Right 2011  . LEFT HEART CATHETERIZATION WITH CORONARY ANGIOGRAM N/A 09/21/2012   Procedure: LEFT HEART CATHETERIZATION WITH CORONARY ANGIOGRAM;  Surgeon: Peter M Martinique, MD;  Location: [facility] CATH LAB;  Service: Cardiovascular;  Laterality: N/A;  . LUMBAR DISC SURGERY  1980's  . LYMPHADENECTOMY Bilateral 01/19/2013   Procedure: LYMPHADENECTOMY;  Surgeon: Bernestine Amass, MD;  Location: [facility];  Service: Urology;  Laterality: Bilateral;  . ROBOT ASSISTED LAPAROSCOPIC RADICAL PROSTATECTOMY N/A 01/19/2013   Procedure: ROBOTIC ASSISTED LAPAROSCOPIC RADICAL PROSTATECTOMY;  Surgeon: Bernestine Amass, MD;  Location: [facility];  Service: Urology;  Laterality: N/A;  . SHOULDER OPEN ROTATOR CUFF REPAIR Left 1980's    There were  no vitals filed for this visit.      Subjective Assessment - 12/20/15 1600    Subjective "Im all right, its just this leg here, I cant pick it up"   Currently in Pain? No/denies   Pain Score 0-No pain                         [facility] Adult PT Treatment/Exercise - 12/20/15 0001      High Level Balance   High Level Balance Activities Side stepping     Exercises   Exercises Lumbar     Lumbar Exercises: Machines for Strengthening   Cybex Knee Extension 5# 2x10   Cybex Knee Flexion 20# 2x10   Cybex Leg Press 20lb 2x10; RLE only  20lb x5   some assist with SLS     Lumbar Exercises: Standing   Row Both;15 reps;Theraband   Theraband Level (Row) Level 2 (Red)     Knee/Hip Exercises: Aerobic   Nustep level 5 x 6 minutes     Knee/Hip Exercises: Seated   Long Arc Quad 2 sets;Both;10 reps   Long Arc Quad Weight 3 lbs.   Other Seated Knee/Hip Exercises Fitter heel presses 2 blue 3x10   Marching Limitations 2x10   Marching Weights 3 lbs.   Abduction/Adduction  2 sets;Both;15 reps   Abd/Adduction Limitations green tband   Sit to Sand 2 sets;10 reps;with UE support                  PT Short Term Goals - 12/20/15 1652      PT SHORT TERM GOAL #1   Title independent with initial HEP   Status On-going           PT Long Term Goals - 12/20/15 1652      PT LONG TERM GOAL #1   Title Patient will understand fall risk inside and outside the home   Status On-going     PT LONG TERM GOAL #2   Title decrease TUG time to 15 seconds   Status On-going     PT LONG TERM GOAL #3   Title increase Berg score to 50/56   Status On-going     PT LONG TERM GOAL #4   Title patient will be able to walk around our building without difficulty   Status On-going     PT LONG TERM GOAL #5   Title increase strength of the right LE to 4/5   Status On-going               Plan - 12/20/15 1649    Clinical Impression Statement Pt tolerated an initial progression to exercises well. Pt appears to have decrease motor control with RLE with all activities. Pt often allows the RLE to snap into extension while on leg press, but able to control some with cues.    Rehab Potential Good   PT Frequency 2x / week   PT Duration 8 weeks   PT Treatment/Interventions ADLs/Self Care Home Management;Functional mobility training;Stair training;Gait training;Therapeutic activities;Therapeutic exercise;Balance training;Neuromuscular re-education;Patient/family education;Passive range of motion;Manual techniques   PT Next Visit Plan add  balance and strength exercises      Patient will benefit from skilled therapeutic intervention in order to improve the following deficits and impairments:  Abnormal gait, Decreased activity tolerance, Decreased balance, Decreased mobility, Decreased endurance, Decreased coordination, Decreased range of motion, Decreased strength, Difficulty walking, Impaired flexibility, Postural dysfunction, Improper body mechanics, Impaired UE functional use  Visit Diagnosis: Difficulty in walking, not elsewhere classified  Ataxic gait     Problem List Patient Active Problem List   Diagnosis Date Noted  . Environmental allergies 12/04/2015  . Allergic rhinitis 10/28/2015  . ESRD on dialysis ([place]) 10/28/2015  . 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 Malnutrition of moderate degree 04/05/2015  . Acute renal failure superimposed on stage 4 chronic kidney disease ([place]) 04/04/2015  . Benign paroxysmal positional vertigo 09/26/2014  . Right leg weakness 02/28/2014  . Metatarsalgia of both feet 09/13/2013  . Equinus deformity of foot, acquired 09/13/2013  . Diabetes type 2, uncontrolled ([place]) 04/27/2013  . GERD (gastroesophageal reflux disease) 03/24/2013  . GI bleed 02/28/2013  . CVA (cerebral infarction) 11/17/2012  . Dizziness and giddiness 10/31/2012  . Prostate cancer ([place]) 10/27/2012  . Diabetic neuropathy ([place]) 10/04/2012  . CAD (coronary artery disease) 10/04/2012  . Hoarseness 06/26/2012  . Hematuria 05/24/2012  . Tinea pedis 05/24/2012  . Back pain 02/23/2012  . Erectile dysfunction 02/03/2012  . Type 2 diabetes, uncontrolled, with neuropathy ([place]) 02/01/2012  . Essential hypertension 02/01/2012  . Tobacco use 02/01/2012  . Hyperlipidemia 02/01/2012  . Anemia of renal disease 05/14/2011    Scot Jun, PTA 12/20/2015, 4:52 PM  [place] [address] [place], [place], [address] Phone: [phone]    Fax:  [phone]  Name: ESTANISLAO TORSTENSON MRN: [id number] Date of Birth: 10/13/1945

## 2015-12-21 ENCOUNTER — Encounter: Payer: Self-pay | Admitting: Family

## 2015-12-21 ENCOUNTER — Telehealth: Payer: Self-pay | Admitting: Family

## 2015-12-21 DIAGNOSIS — R1310 Dysphagia, unspecified: Secondary | ICD-10-CM | POA: Insufficient documentation

## 2015-12-21 NOTE — Telephone Encounter (Signed)
Pt returned my call but connection was very bad and call disconnected. Attempted to reach pt and received voicemail. Left message to call me back.

## 2015-12-21 NOTE — Telephone Encounter (Signed)
Notified pt and he voices understanding. No questions at this time. States he doesn't have [facility] neurolog[facility] phone # and doesn't have pen to write it down at this time. He will call back for #.

## 2015-12-21 NOTE — Telephone Encounter (Signed)
Attempted to reach pt and left message to return my call. 

## 2015-12-21 NOTE — Telephone Encounter (Signed)
Also, I spoke to Cecille Rubin NP with [facility].  She told me that the patient no-showed his appointment. She would like him to call their office to reschedule please.

## 2015-12-21 NOTE — Telephone Encounter (Signed)
Please contact patient and make sure he is clear on the swallow study diet recommendations:   Regular solids;Thin liquid  Liquid Administration via Cup;No straw  Medication Administration Whole meds with puree  Compensations Slow rate;Small sips/bites  Postural Changes Seated upright at 90 degrees

## 2015-12-25 ENCOUNTER — Ambulatory Visit: Payer: Medicare Other | Admitting: Family

## 2015-12-25 ENCOUNTER — Emergency Department (HOSPITAL_BASED_OUTPATIENT_CLINIC_OR_DEPARTMENT_OTHER): Payer: Medicare Other

## 2015-12-25 ENCOUNTER — Emergency Department (HOSPITAL_BASED_OUTPATIENT_CLINIC_OR_DEPARTMENT_OTHER)
Admission: EM | Admit: 2015-12-25 | Discharge: 2015-12-25 | Disposition: A | Discharge status: Home / Self Care | Payer: Medicare Other | Attending: Emergency Medicine | Admitting: Emergency Medicine

## 2015-12-25 ENCOUNTER — Encounter (HOSPITAL_BASED_OUTPATIENT_CLINIC_OR_DEPARTMENT_OTHER): Payer: Self-pay | Admitting: *Deleted

## 2015-12-25 ENCOUNTER — Telehealth: Payer: Self-pay | Admitting: Family

## 2015-12-25 ENCOUNTER — Ambulatory Visit: Payer: Medicare Other | Admitting: Physical Therapy

## 2015-12-25 ENCOUNTER — Telehealth: Payer: Self-pay

## 2015-12-25 DIAGNOSIS — Z79899 Other long term (current) drug therapy: Secondary | ICD-10-CM | POA: Diagnosis not present

## 2015-12-25 DIAGNOSIS — N186 End stage renal disease: Secondary | ICD-10-CM | POA: Diagnosis not present

## 2015-12-25 DIAGNOSIS — Z992 Dependence on renal dialysis: Secondary | ICD-10-CM | POA: Insufficient documentation

## 2015-12-25 DIAGNOSIS — I120 Hypertensive chronic kidney disease with stage 5 chronic kidney disease or end stage renal disease: Secondary | ICD-10-CM | POA: Diagnosis not present

## 2015-12-25 DIAGNOSIS — R319 Hematuria, unspecified: Secondary | ICD-10-CM

## 2015-12-25 DIAGNOSIS — Z8546 Personal history of malignant neoplasm of prostate: Secondary | ICD-10-CM | POA: Diagnosis not present

## 2015-12-25 DIAGNOSIS — Z8673 Personal history of transient ischemic attack (TIA), and cerebral infarction without residual deficits: Secondary | ICD-10-CM | POA: Diagnosis not present

## 2015-12-25 DIAGNOSIS — I2510 Atherosclerotic heart disease of native coronary artery without angina pectoris: Secondary | ICD-10-CM | POA: Diagnosis not present

## 2015-12-25 DIAGNOSIS — R300 Dysuria: Secondary | ICD-10-CM | POA: Diagnosis present

## 2015-12-25 DIAGNOSIS — E1122 Type 2 diabetes mellitus with diabetic chronic kidney disease: Secondary | ICD-10-CM | POA: Insufficient documentation

## 2015-12-25 DIAGNOSIS — N3001 Acute cystitis with hematuria: Secondary | ICD-10-CM | POA: Insufficient documentation

## 2015-12-25 DIAGNOSIS — F17210 Nicotine dependence, cigarettes, uncomplicated: Secondary | ICD-10-CM | POA: Diagnosis not present

## 2015-12-25 LAB — URINE MICROSCOPIC-ADD ON

## 2015-12-25 LAB — URINALYSIS, ROUTINE W REFLEX MICROSCOPIC
Bilirubin Urine: NEGATIVE
GLUCOSE, UA: 250 mg/dL — AB
Ketones, ur: NEGATIVE mg/dL
Nitrite: NEGATIVE
Protein, ur: >300 mg/dL — AB
SPECIFIC GRAVITY, URINE: 1.023 (ref 1.005–1.030)
pH: 6 (ref 5.0–8.0)

## 2015-12-25 LAB — CBC WITH DIFFERENTIAL/PLATELET
Basophils Absolute: 0.1 10*3/uL (ref 0.0–0.1)
Basophils Relative: 1 %
EOS PCT: 4 %
Eosinophils Absolute: 0.2 10*3/uL (ref 0.0–0.7)
HEMATOCRIT: 26.1 % — AB (ref 39.0–52.0)
Hemoglobin: 9 g/dL — ABNORMAL LOW (ref 13.0–17.0)
LYMPHS ABS: 0.9 10*3/uL (ref 0.7–4.0)
LYMPHS PCT: 14 %
MCH: 33.8 pg (ref 26.0–34.0)
MCHC: 34.5 g/dL (ref 30.0–36.0)
MCV: 98.1 fL (ref 78.0–100.0)
Monocytes Absolute: 1 10*3/uL (ref 0.1–1.0)
Monocytes Relative: 15 %
Neutro Abs: 4.3 10*3/uL (ref 1.7–7.7)
Neutrophils Relative %: 66 %
PLATELETS: 295 10*3/uL (ref 150–400)
RBC: 2.66 MIL/uL — AB (ref 4.22–5.81)
RDW: 15.3 % (ref 11.5–15.5)
WBC: 6.5 10*3/uL (ref 4.0–10.5)

## 2015-12-25 LAB — BASIC METABOLIC PANEL
Anion gap: 8 (ref 5–15)
BUN: 31 mg/dL — AB (ref 6–20)
CHLORIDE: 102 mmol/L (ref 101–111)
CO2: 28 mmol/L (ref 22–32)
Calcium: 8.9 mg/dL (ref 8.9–10.3)
Creatinine, Ser: 6.23 mg/dL — ABNORMAL HIGH (ref 0.61–1.24)
GFR calc Af Amer: 9 mL/min — ABNORMAL LOW (ref >60)
GFR, EST NON AFRICAN AMERICAN: 8 mL/min — AB (ref >60)
GLUCOSE: 150 mg/dL — AB (ref 65–99)
POTASSIUM: 3.7 mmol/L (ref 3.5–5.1)
Sodium: 138 mmol/L (ref 135–145)

## 2015-12-25 MED ORDER — CEPHALEXIN 500 MG PO CAPS: 500.0000 mg | ORAL_CAPSULE | Freq: Three times a day (TID) | ORAL | 0 refills | Status: DC

## 2015-12-25 MED FILL — CEPHALEXIN 500 MG CAPSULE: 500 | 7 days supply | Qty: 21 | Fill #0

## 2015-12-25 NOTE — Telephone Encounter (Signed)
12/25/15 patient cxl PT - has blood in urine, going to ER

## 2015-12-25 NOTE — ED Provider Notes (Signed)
[facility] DEPT [facility] Provider Note   CSN: [id number] Arrival date & time: 12/25/15  [id number]     History   Chief Complaint Chief Complaint  Patient presents with  . Dysuria    HPI [name] is a 70 y.o. male.  The history is provided by the patient and medical records. No language interpreter was used.  Dysuria   Associated symptoms include hematuria. Pertinent negatives include no nausea, no vomiting, no frequency and no flank pain.   [name] is a 70 y.o. male  with a PMH of HTN, HLD, CAD, DM, CKD who presents to the Emergency Department complaining of painless hematuria intermittently over the last week. Over the last two days, the amount of blood in his urine has increased, prompting him to seek evaluation in the ER today. He also endorses a burning sensation with urination. He denies abdominal pain, back pain, flank pain, nausea, vomiting, urinary urgency/frequency, fevers. Followed by [facility], Dr. Risa Grill for prostate cancer. No alleviating or aggravating factors. No medications taken prior to arrival for symptoms.   Past Medical History:  Diagnosis Date  . 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 Anemia, iron deficiency 03/24/2011   a. Recurrent GI bleed, tx with periodic iron infusions.  . 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 AVM (arteriovenous malformation) of duodenum, acquired    egds in 01/2012, 04/2011  . BPH (benign prostatic hyperplasia)   . CAD (coronary artery disease)    a. Cath 09/2012: moderate borderline CAD in mid LAD/small diagonal branch, mild RCA stenosis, to be managed medically   . Chronic lower back pain   . Diabetic peripheral neuropathy ([place]) 2014   foot pain.  . Fatty liver    on CT of 11/2010  . GERD (gastroesophageal reflux disease)   . GI bleed    a. Recurrent GI bleed, tx with IV iron. b. Per heme notes - likely AVMs 04/2012 (tx with cauterization several months ago).  . Hematuria    a. Urology note scan from 07/2012:  cystoscopy without evidence for bladder lesion, only lateral hypertrophy of posterior urethra, bladder impression from BPH. b. Pt states he had "some tests" scheduled for later in July 2014.  . High cholesterol   . Hypertension   . 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 Orthostatic hypotension    a. Tx with florinef.  . Peripheral neuropathy ([place])   . Polyp, colonic    Colonoscopy 01/2012 "benign" polyp  . Prostate cancer ([place])   . Sleep apnea    "had mask; couldn't sleep in it" (09/20/2012)  . Smoker   . Stage III chronic kidney disease    a. Stage 3 (DM with complications ->CKD, peripheral neuropathy).  . Stroke ([place])    x 2  . Type II diabetes mellitus ([place])    a. Dx 1994, uncontrolled.     Patient Active Problem List   Diagnosis Date Noted  . Dysphagia 12/21/2015  . Environmental allergies 12/04/2015  . Allergic rhinitis 10/28/2015  . ESRD on dialysis ([place]) 10/28/2015  . 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 Malnutrition of moderate degree 04/05/2015  . Acute renal failure superimposed on stage 4 chronic kidney disease ([place]) 04/04/2015  . Benign paroxysmal positional vertigo 09/26/2014  . Right leg weakness 02/28/2014  . Metatarsalgia of both feet 09/13/2013  . Equinus deformity of foot, acquired 09/13/2013  . Diabetes type 2, uncontrolled ([place]) 04/27/2013  . GERD (gastroesophageal reflux disease) 03/24/2013  . GI bleed 02/28/2013  .  CVA (cerebral infarction) 11/17/2012  . Dizziness and giddiness 10/31/2012  . Prostate cancer ([place]) 10/27/2012  . Diabetic neuropathy ([place]) 10/04/2012  . CAD (coronary artery disease) 10/04/2012  . Hoarseness 06/26/2012  . Hematuria 05/24/2012  . Tinea pedis 05/24/2012  . Back pain 02/23/2012  . Erectile dysfunction 02/03/2012  . Type 2 diabetes, uncontrolled, with neuropathy ([place]) 02/01/2012  . Essential hypertension 02/01/2012  . Tobacco use 02/01/2012  . Hyperlipidemia 02/01/2012  . Anemia of renal disease  05/14/2011    Past Surgical History:  Procedure Laterality Date  . ENTEROSCOPY N/A 03/01/2013   Procedure: ENTEROSCOPY;  Surgeon: Inda Castle, MD;  Location: [facility];  Service: Endoscopy;  Laterality: N/A;  . GIVENS CAPSULE STUDY N/A 04/05/2015   Procedure: GIVENS CAPSULE STUDY;  Surgeon: Gatha Mayer, MD;  Location: [facility];  Service: Endoscopy;  Laterality: N/A;  . INGUINAL HERNIA REPAIR Right 2011  . LEFT HEART CATHETERIZATION WITH CORONARY ANGIOGRAM N/A 09/21/2012   Procedure: LEFT HEART CATHETERIZATION WITH CORONARY ANGIOGRAM;  Surgeon: Peter M Martinique, MD;  Location: [facility] CATH LAB;  Service: Cardiovascular;  Laterality: N/A;  . LUMBAR DISC SURGERY  1980's  . LYMPHADENECTOMY Bilateral 01/19/2013   Procedure: LYMPHADENECTOMY;  Surgeon: Bernestine Amass, MD;  Location[facility];  Service: Urology;  Laterality: Bilateral;  . ROBOT ASSISTED LAPAROSCOPIC RADICAL PROSTATECTOMY N/A 01/19/2013   Procedure: ROBOTIC ASSISTED LAPAROSCOPIC RADICAL PROSTATECTOMY;  Surgeon: Bernestine Amass, MD;  [facility];  Service: Urology;  Laterality: N/A;  . SHOULDER OPEN ROTATOR CUFF REPAIR Left 1980's       Home Medications    Prior to Admission medications   Medication Sig Start Date End Date Taking? Authorizing Provider  carvedilol (COREG) 3.125 MG tablet TAKE 1 TABLET BY MOUTH TWICE A DAY WITH A MEAL 10/16/15  Yes Historical Provider, MD  loratadine (CLARITIN) 10 MG tablet Take 1 tablet (10 mg total) by mouth every other day. 10/25/15  Yes [name], NP  nitroGLYCERIN (NITROSTAT) 0.4 MG SL tablet Place 1 tablet (0.4 mg total) under the tongue every 5 (five) minutes as needed for chest pain (up to 3 doses). 10/08/15  Yes [name], NP  Nutritional Supplements (FEEDING SUPPLEMENT, NEPRO CARB STEADY,) LIQD Take 237 mLs by mouth 3 (three) times daily between meals. 10/12/15  Yes [name], NP  rosuvastatin (CRESTOR) 20 MG tablet Take 1 tablet (20 mg total) by mouth daily.  12/05/15  Yes [name], NP  cephALEXin (KEFLEX) 500 MG capsule Take 1 capsule (500 mg total) by mouth 3 (three) times daily. 12/25/15   Ozella Almond Anastashia Westerfeld, PA-C    Family History Family History  Problem Relation Age of Onset  . Stroke Father     Died at 16  . Diabetes Mother   . Heart attack Brother     Died at 33  . Diabetes Sister   . Stomach cancer Brother   . Heart attack Sister     Social History Social History  Substance Use Topics  . Smoking status: Current Some Day Smoker    Packs/day: 1.00    Years: 43.00    Types: Cigarettes    Start date: 04/15/1968  . Smokeless tobacco: Never Used     Comment: 07-22--2016  still smoking  . Alcohol use No     Comment: 09/20/2012 "Used to; stopped ~ 2009; never had problem w/it"     Allergies   Review of patient's allergies indicates no known allergies.   Review of Systems Review of  Systems  Constitutional: Negative for fever.  HENT: Negative for congestion.   Eyes: Negative for visual disturbance.  Respiratory: Negative for cough and shortness of breath.   Cardiovascular: Negative.   Gastrointestinal: Negative for abdominal pain, constipation, diarrhea, nausea and vomiting.  Genitourinary: Positive for dysuria and hematuria. Negative for discharge, flank pain, frequency, penile pain, penile swelling, scrotal swelling and testicular pain.  Musculoskeletal: Negative for back pain and neck pain.  Skin: Negative for rash.  Neurological: Negative for headaches.     Physical Exam Updated Vital Signs BP 134/81 (BP Location: Right Arm)   Pulse 96   Temp 98.2 F (36.8 C) (Oral)   Resp 18   Ht 5\' 7"  (1.702 m)   Wt 63.5 kg   SpO2 100%   BMI 21.93 kg/m   Physical Exam  Constitutional: He is oriented to person, place, and time. He appears well-developed and well-nourished. No distress.  HENT:  Head: Normocephalic and atraumatic.  Cardiovascular: Normal rate, regular rhythm and normal heart sounds.     Pulmonary/Chest: Effort normal and breath sounds normal. No respiratory distress. He exhibits no tenderness.  Abdominal: Soft. Bowel sounds are normal. He exhibits no distension.  No CVA or abdominal tenderness.   Musculoskeletal: Normal range of motion.  Neurological: He is alert and oriented to person, place, and time.  Skin: Skin is warm and dry.  Nursing note and vitals reviewed.    ED Treatments / Results  Labs (all labs ordered are listed, but only abnormal results are displayed) Labs Reviewed  URINALYSIS, ROUTINE W REFLEX MICROSCOPIC (NOT AT [facility]) - Abnormal; Notable for the following:       Result Value   Color, Urine RED (*)    APPearance CLOUDY (*)    Glucose, UA 250 (*)    Hgb urine dipstick LARGE (*)    Protein, ur >300 (*)    Leukocytes, UA SMALL (*)    All other components within normal limits  CBC WITH DIFFERENTIAL/PLATELET - Abnormal; Notable for the following:    RBC 2.66 (*)    Hemoglobin 9.0 (*)    HCT 26.1 (*)    All other components within normal limits  BASIC METABOLIC PANEL - Abnormal; Notable for the following:    Glucose, Bld 150 (*)    BUN 31 (*)    Creatinine, Ser 6.23 (*)    GFR calc non Af Amer 8 (*)    GFR calc Af Amer 9 (*)    All other components within normal limits  URINE MICROSCOPIC-ADD ON - Abnormal; Notable for the following:    Squamous Epithelial / LPF 0-5 (*)    Bacteria, UA MANY (*)    Casts GRANULAR CAST (*)    All other components within normal limits    EKG  EKG Interpretation None       Radiology Ct Abdomen Pelvis Wo Contrast  Result Date: 12/25/2015 CLINICAL DATA:  Painless hematuria for several days EXAM: CT ABDOMEN AND PELVIS WITHOUT CONTRAST TECHNIQUE: Multidetector CT imaging of the abdomen and pelvis was performed following the standard protocol without IV contrast. COMPARISON:  11/22/2012 FINDINGS: Lower chest: No acute abnormality. Hepatobiliary: No focal liver abnormality is seen. No gallstones, gallbladder  wall thickening, or biliary dilatation. Pancreas: Unremarkable. No pancreatic ductal dilatation or surrounding inflammatory changes. Spleen: Normal in size without focal abnormality. Adrenals/Urinary Tract: The adrenal glands are within normal limits. Scattered hypodensities are noted bilaterally consistent with renal cysts. No obstructive changes are seen. No calculi are identified. Stomach/Bowel:  Stomach is within normal limits. Appendix appears normal. No evidence of bowel wall thickening, distention, or inflammatory changes. Vascular/Lymphatic: Aortic atherosclerosis. No enlarged abdominal or pelvic lymph nodes. Reproductive: Prostate has been surgically removed. Other: No abdominal wall hernia or abnormality. No abdominopelvic ascites. Musculoskeletal: Degenerative changes of lumbar spine are noted. Postsurgical changes are noted as well. IMPRESSION: Scattered renal cysts stable from the previous exam. No acute abnormality is noted. Electronically Signed   By: Inez Catalina M.D.   On: 12/25/2015 11:04    Procedures Procedures (including critical care time)  Medications Ordered in ED Medications - No data to display   Initial Impression / Assessment and Plan / ED Course  I have reviewed the triage vital signs and the nursing notes.  Pertinent labs & imaging results that were available during my care of the patient were reviewed by me and considered in my medical decision making (see chart for details).  Clinical Course   [name] is a 70 y.o. male who presents to ED for hematuria and dysuria. No back pain, abdominal pain, n/v or fevers. Hx of prostate cancer and CKD. Patient is afebrile and hemodynamically stable with a benign physical examination. CT abd. Reassuring. UA obtained which shows small leuks and TNTC WBC's. Will treat UTI with keflex. Patient followed by PCP and urology. Follow-up care and return precautions were discussed. All questions answered.  Patient discussed with Dr.  Ellender Hose who agrees with treatment plan.   Final Clinical Impressions(s) / ED Diagnoses   Final diagnoses:  Acute cystitis with hematuria    New Prescriptions New Prescriptions   CEPHALEXIN (KEFLEX) 500 MG CAPSULE    Take 1 capsule (500 mg total) by mouth 3 (three) times daily.     Ozella Almond Markee Remlinger, PA-C 12/25/15 1217    Duffy Bruce, MD 12/26/15 [phone]

## 2015-12-25 NOTE — Telephone Encounter (Signed)
Please let pt know that I reviewed his ED note.  I would like to get him back in with Dr. Risa Grill to evaluate his blood in his urine.

## 2015-12-25 NOTE — ED Triage Notes (Signed)
C/o burning on urination.  Pt states he has blood in urination. No penile discharge.

## 2015-12-25 NOTE — Discharge Instructions (Signed)
Please take all of your antibiotics until finished!  Follow up with your primary care physician for discussion of today's diagnosis. If symptoms are not improved staying in the next 3 days, you will need to follow up with your primary care provider as well. Return to ER for fevers, abdominal pain, back pain, new or worsening symptoms, any additional concerns.

## 2015-12-26 NOTE — Telephone Encounter (Signed)
Notified pt and he is agreeable to proceed with referral. Order signed.

## 2015-12-26 NOTE — Telephone Encounter (Signed)
Left message for pt to return my call.

## 2015-12-27 ENCOUNTER — Encounter: Payer: Self-pay | Admitting: Physical Therapy

## 2015-12-27 ENCOUNTER — Ambulatory Visit: Payer: Medicare Other | Admitting: Neurology

## 2015-12-27 ENCOUNTER — Ambulatory Visit: Payer: Medicare Other | Admitting: Physical Therapy

## 2015-12-27 DIAGNOSIS — R262 Difficulty in walking, not elsewhere classified: Secondary | ICD-10-CM | POA: Diagnosis not present

## 2015-12-27 DIAGNOSIS — R260 Ataxic gait: Secondary | ICD-10-CM

## 2015-12-27 NOTE — Therapy (Addendum)
[facility] [address] [place], [place], [address] Phone: [phone]   Fax:  [phone]  Physical Therapy Treatment  Patient Details  Name: [name] MRN: [id number] Date of Birth: [birth year] Referring Provider: [name]  Encounter Date: 12/27/2015      PT End of Session - 12/27/15 1556    Visit Number 3   Date for PT Re-Evaluation 02/12/16   PT Start Time 3903   PT Stop Time 1600   PT Time Calculation (min) 44 min   Activity Tolerance Patient tolerated treatment well   Behavior During Therapy [facility] for tasks assessed/performed      Past Medical History:  Diagnosis Date  . 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 Anemia, iron deficiency 03/24/2011   a. Recurrent GI bleed, tx with periodic iron infusions.  . 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 AVM (arteriovenous malformation) of duodenum, acquired    egds in 01/2012, 04/2011  . BPH (benign prostatic hyperplasia)   . CAD (coronary artery disease)    a. Cath 09/2012: moderate borderline CAD in mid LAD/small diagonal branch, mild RCA stenosis, to be managed medically   . Chronic lower back pain   . Diabetic peripheral neuropathy ([place]) 2014   foot pain.  . Fatty liver    on CT of 11/2010  . GERD (gastroesophageal reflux disease)   . GI bleed    a. Recurrent GI bleed, tx with IV iron. b. Per heme notes - likely AVMs 04/2012 (tx with cauterization several months ago).  . Hematuria    a. Urology note scan from 07/2012: cystoscopy without evidence for bladder lesion, only lateral hypertrophy of posterior urethra, bladder impression from BPH. b. Pt states he had "some tests" scheduled for later in July 2014.  . High cholesterol   . Hypertension   . 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 Orthostatic hypotension    a. Tx with florinef.  . Peripheral neuropathy ([place])   . Polyp, colonic    Colonoscopy 01/2012 "benign" polyp  .  Prostate cancer ([place])   . Sleep apnea    "had mask; couldn't sleep in it" (09/20/2012)  . Smoker   . Stage III chronic kidney disease    a. Stage 3 (DM with complications ->CKD, peripheral neuropathy).  . Stroke ([place])    x 2  . Type II diabetes mellitus ([place])    a. Dx 1994, uncontrolled.     Past Surgical History:  Procedure Laterality Date  . ENTEROSCOPY N/A 03/01/2013   Procedure: ENTEROSCOPY;  Surgeon: Inda Castle, MD;  Location: [facility];  Service: Endoscopy;  Laterality: N/A;  . GIVENS CAPSULE STUDY N/A 04/05/2015   Procedure: GIVENS CAPSULE STUDY;  Surgeon: Gatha Mayer, MD;  Location: [facility];  Service: Endoscopy;  Laterality: N/A;  . INGUINAL HERNIA REPAIR Right 2011  . LEFT HEART CATHETERIZATION WITH CORONARY ANGIOGRAM N/A 09/21/2012   Procedure: LEFT HEART CATHETERIZATION WITH CORONARY ANGIOGRAM;  Surgeon: Peter M Martinique, MD;  Location: [facility] C[facility] LAB;  Service: Cardiovascular;  Laterality: N/A;  . LUMBAR DISC SURGERY  1980's  . LYMPHADENECTOMY Bilateral 01/19/2013   Procedure: LYMPHADENECTOMY;  Surgeon: Bernestine Amass, MD;  Location: [facility];  Service: Urology;  Laterality: Bilateral;  . ROBOT ASSISTED LAPAROSCOPIC RADICAL PROSTATECTOMY N/A 01/19/2013   Procedure: ROBOTIC ASSISTED LAPAROSCOPIC RADICAL PROSTATECTOMY;  Surgeon: Bernestine Amass, MD;  Location: [facility];  Service: Urology;  Laterality: N/A;  . SHOULDER OPEN ROTATOR CUFF  REPAIR Left 1980's    There were no vitals filed for this visit.      Subjective Assessment - 12/27/15 1521    Subjective "My leg is a little bit better, I just cant pick it up"   Currently in Pain? No/denies   Pain Score 0-No pain                         [facility] Adult PT Treatment/Exercise - 12/27/15 0001      Lumbar Exercises: Aerobic   UBE (Upper Arm Bike) L2 2 frd/2 bak     Knee/Hip Exercises: Aerobic   Nustep level 5 x 7 minutes     Knee/Hip Exercises: Machines for Strengthening   Cybex Leg Press 20lb 3x10;  RLE only 20lb 2x5      Knee/Hip Exercises: Standing   Forward Step Up 2 sets;Right;10 reps;Hand Hold: 1;Step Height: 6"     Knee/Hip Exercises: Seated   Long Arc Quad 2 sets;10 reps;Right   Long Arc Quad Weight 3 lbs.   Hamstring Curl Right;2 sets;10 reps   Hamstring Limitations Red Tband    Sit to Sand 2 sets;10 reps;with UE support                  PT Short Term Goals - 12/20/15 1652      PT SHORT TERM GOAL #1   Title independent with initial HEP   Status On-going           PT Long Term Goals - 12/27/15 1600      PT LONG TERM GOAL #1   Title Patient will understand fall risk inside and outside the home   Status On-going     PT LONG TERM GOAL #2   Title decrease TUG time to 15 seconds   Status On-going     PT LONG TERM GOAL #3   Title increase Berg score to 50/56   Status On-going     PT LONG TERM GOAL #4   Status On-going     PT LONG TERM GOAL #5   Title increase strength of the right LE to 4/5   Status On-going               Plan - 12/27/15 1557    Clinical Impression Statement Pt demo ed good motor control of RLE. Initial instability with step ups, but  balance improved with step ups after repeated repetitions. Pt doe not report any pain in LE but does report some pain in L elbow.   Rehab Potential Good   PT Frequency 2x / week   PT Duration 8 weeks   PT Treatment/Interventions ADLs/Self Care Home Management;Functional mobility training;Stair training;Gait training;Therapeutic activities;Therapeutic exercise;Balance training;Neuromuscular re-education;Patient/family education;Passive range of motion;Manual techniques   PT Next Visit Plan balance and strength exercises      Patient will benefit from skilled therapeutic intervention in order to improve the following deficits and impairments:  Abnormal gait, Decreased activity tolerance, Decreased balance, Decreased mobility, Decreased endurance, Decreased coordination, Decreased range of  motion, Decreased strength, Difficulty walking, Impaired flexibility, Postural dysfunction, Improper body mechanics, Impaired UE functional use  Visit Diagnosis: Difficulty in walking, not elsewhere classified  Ataxic gait     Problem List Patient Active Problem List   Diagnosis Date Noted  . Dysphagia 12/21/2015  . Environmental allergies 12/04/2015  . Allergic rhinitis 10/28/2015  . ESRD on dialysis ([place]) 10/28/2015  . 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 Malnutrition of  moderate degree 04/05/2015  . Acute renal failure superimposed on stage 4 chronic kidney disease ([place]) 04/04/2015  . Benign paroxysmal positional vertigo 09/26/2014  . Right leg weakness 02/28/2014  . Metatarsalgia of both feet 09/13/2013  . Equinus deformity of foot, acquired 09/13/2013  . Diabetes type 2, uncontrolled ([place]) 04/27/2013  . GERD (gastroesophageal reflux disease) 03/24/2013  . GI bleed 02/28/2013  . CVA (cerebral infarction) 11/17/2012  . Dizziness and giddiness 10/31/2012  . Prostate cancer ([place]) 10/27/2012  . Diabetic neuropathy ([place]) 10/04/2012  . CAD (coronary artery disease) 10/04/2012  . Hoarseness 06/26/2012  . Hematuria 05/24/2012  . Tinea pedis 05/24/2012  . Back pain 02/23/2012  . Erectile dysfunction 02/03/2012  . Type 2 diabetes, uncontrolled, with neuropathy ([place]) 02/01/2012  . Essential hypertension 02/01/2012  . Tobacco use 02/01/2012  . Hyperlipidemia 02/01/2012  . Anemia of renal disease 05/14/2011    PHYSICAL THERAPY DISCHARGE SUMMARY  Visits from Start of Care: 3 Plan: Patient agrees to discharge.  Patient goals were not met. Patient is being discharged due to not returning since the last visit.  ?????    Scot Jun, PTA  12/27/2015, 4:01 PM  [address] [place], [place], [address] Phone: [phone]   Fax:  [phone]  Name: FILOMENO CROMLEY MRN: [id number] Date  of Birth: 1945/12/07

## 2016-01-01 ENCOUNTER — Ambulatory Visit: Payer: Medicare Other | Admitting: Physical Therapy

## 2016-01-01 ENCOUNTER — Encounter: Payer: Self-pay | Admitting: Family

## 2016-01-01 ENCOUNTER — Ambulatory Visit (INDEPENDENT_AMBULATORY_CARE_PROVIDER_SITE_OTHER): Payer: Medicare Other | Admitting: Family

## 2016-01-01 VITALS — BP 132/72 | HR 95 | Temp 97.8°F | Resp 16 | Ht 67.0 in | Wt 143.6 lb

## 2016-01-01 DIAGNOSIS — D631 Anemia in chronic kidney disease: Secondary | ICD-10-CM

## 2016-01-01 DIAGNOSIS — E118 Type 2 diabetes mellitus with unspecified complications: Secondary | ICD-10-CM | POA: Diagnosis not present

## 2016-01-01 DIAGNOSIS — I639 Cerebral infarction, unspecified: Secondary | ICD-10-CM

## 2016-01-01 DIAGNOSIS — I10 Essential (primary) hypertension: Secondary | ICD-10-CM

## 2016-01-01 DIAGNOSIS — N189 Chronic kidney disease, unspecified: Secondary | ICD-10-CM

## 2016-01-01 DIAGNOSIS — E785 Hyperlipidemia, unspecified: Secondary | ICD-10-CM

## 2016-01-01 MED ORDER — CLOPIDOGREL BISULFATE 75 MG PO TABS: 75.0000 mg | ORAL_TABLET | Freq: Every day | ORAL | 3 refills | Status: DC

## 2016-01-01 NOTE — Progress Notes (Signed)
Pre visit review using our clinic review tool, if applicable. No additional management support is needed unless otherwise documented below in the visit note. 

## 2016-01-01 NOTE — Assessment & Plan Note (Signed)
Obtain follow up lipid panel.

## 2016-01-01 NOTE — Assessment & Plan Note (Signed)
Lab Results  Component Value Date   WBC 6.5 12/25/2015   HGB 9.0 (L) 12/25/2015   HCT 26.1 (L) 12/25/2015   MCV 98.1 12/25/2015   PLT 295 12/25/2015   Management per hematology.

## 2016-01-01 NOTE — Assessment & Plan Note (Signed)
He missed his last appointment with [facility] neurology. We have rescheduled this appointment for him. In the meantime, I have advised pt to restart his plavix for stroke prevention. He is continuing PT which he reports is helping him.

## 2016-01-01 NOTE — Assessment & Plan Note (Signed)
Stable on carvedilol, continue same.

## 2016-01-01 NOTE — Patient Instructions (Addendum)
See Cecille Rubin, NP with [facility] Neurology Tuesday (01/08/16) at 1:45PM.   [address]  [phone] Restart plavix.   Complete lab work prior to leaving

## 2016-01-01 NOTE — Assessment & Plan Note (Signed)
A1C is at goal.  Monitor on diet.

## 2016-01-01 NOTE — Progress Notes (Signed)
Subjective:    Patient ID: [name], male    DOB: May 31, 1945, 70 y.o.   MRN: [id number]  HPI  [name] is a 70 yr old male who presents today for follow up.  1) HTN- current BP medication includes coreg 3.125 mg bid.  He reports that his BP is "always down when he goes to hemodialysis."   BP Readings from Last 3 Encounters:  01/01/16 132/72  12/25/15 165/91  12/13/15 (!) 164/74   2) Hyperlipidemia- maintained on crestor.  Lab Results  Component Value Date   CHOL 174 12/04/2015   HDL 31.90 (L) 12/04/2015   LDLCALC 109 (H) 12/04/2015   LDLDIRECT 117.0 09/25/2014   TRIG 163.0 (H) 12/04/2015   CHOLHDL 5 12/04/2015   3) Anemia of Renal disease-  Lab Results  Component Value Date   WBC 6.5 12/25/2015   HGB 9.0 (L) 12/25/2015   HCT 26.1 (L) 12/25/2015   MCV 98.1 12/25/2015   PLT 295 12/25/2015   4) Fall-  Patient fell on Saturday-  Stepped in a hole. Reports that he suffered an abrasion to the right buttock.    5) DM2- diet controlled.  Lab Results  Component Value Date   HGBA1C 6.2 10/08/2015   HGBA1C 7.5 (H) 07/18/2015   HGBA1C 7.8 (H) 04/06/2015   Lab Results  Component Value Date   MICROALBUR 1,089.9 (H) 02/03/2014   LDLCALC 109 (H) 12/04/2015   CREATININE 6.23 (H) 12/25/2015   UTI- Since his last visit he was evaluated in the ED on 12/25/15 for UTI. He was treated with keflex. Urine was not cultured at this visit. Reports resolution of symptoms.   CVA-  Saw Dr. Carles Collet on 12/04/15- it was recommended (following hematology approval), that the patient remain on plavix.  He does have hx of multiple AVM's and iron deficiency anemia secondary to recurrent GI bleeding. Per review of ED record, plavix was taken off his med list by an RN on 10/10.   His hemoglobin was stable at that visit at 9.0.   Review of Systems See HPI  Past Medical History:  Diagnosis Date  . 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 Anemia, iron deficiency 03/24/2011   a. Recurrent GI bleed, tx with  periodic iron infusions.  . 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 AVM (arteriovenous malformation) of duodenum, acquired    egds in 01/2012, 04/2011  . BPH (benign prostatic hyperplasia)   . CAD (coronary artery disease)    a. Cath 09/2012: moderate borderline CAD in mid LAD/small diagonal branch, mild RCA stenosis, to be managed medically   . Chronic lower back pain   . Diabetic peripheral neuropathy ([place]) 2014   foot pain.  . Fatty liver    on CT of 11/2010  . GERD (gastroesophageal reflux disease)   . GI bleed    a. Recurrent GI bleed, tx with IV iron. b. Per heme notes - likely AVMs 04/2012 (tx with cauterization several months ago).  . Hematuria    a. Urology note scan from 07/2012: cystoscopy without evidence for bladder lesion, only lateral hypertrophy of posterior urethra, bladder impression from BPH. b. Pt states he had "some tests" scheduled for later in July 2014.  . High cholesterol   . Hypertension   . 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 Orthostatic hypotension    a. Tx with florinef.  . Peripheral neuropathy ([place])   . Polyp, colonic    Colonoscopy 01/2012 "benign" polyp  . Prostate cancer (  [place])   . Sleep apnea    "had mask; couldn't sleep in it" (09/20/2012)  . Smoker   . Stage III chronic kidney disease    a. Stage 3 (DM with complications ->CKD, peripheral neuropathy).  . Stroke ([place])    x 2  . Type II diabetes mellitus ([place])    a. Dx 1994, uncontrolled.      Social History   Social History  . Marital status: Married    Spouse name: N/A  . Number of children: 2  . Years of education: N/A   Occupational History  . taken out of work due to back    Social History Main Topics  . Smoking status: Current Some Day Smoker    Packs/day: 1.00    Years: 43.00    Types: Cigarettes    Start date: 04/15/1968  . Smokeless tobacco: Never Used     Comment: 07-22--2016  still smoking  . Alcohol use No     Comment: 09/20/2012 "Used to; stopped ~  2009; never had problem w/it"  . Drug use: No  . Sexual activity: No   Other Topics Concern  . Not on file   Social History Narrative   Regular exercise: rides horses   Caffeine use: occasionally    Past Surgical History:  Procedure Laterality Date  . ENTEROSCOPY N/A 03/01/2013   Procedure: ENTEROSCOPY;  Surgeon: Inda Castle, MD;  Location: [facility];  Service: Endoscopy;  Laterality: N/A;  . GIVENS CAPSULE STUDY N/A 04/05/2015   Procedure: GIVENS CAPSULE STUDY;  Surgeon: Gatha Mayer, MD;  Location: [facility];  Service: Endoscopy;  Laterality: N/A;  . INGUINAL HERNIA REPAIR Right 2011  . LEFT HEART CATHETERIZATION WITH CORONARY ANGIOGRAM N/A 09/21/2012   Procedure: LEFT HEART CATHETERIZATION WITH CORONARY ANGIOGRAM;  Surgeon: Peter M Martinique, MD;  Location: [facility] CATH LAB;  Service: Cardiovascular;  Laterality: N/A;  . LUMBAR DISC SURGERY  1980's  . LYMPHADENECTOMY Bilateral 01/19/2013   Procedure: LYMPHADENECTOMY;  Surgeon: Bernestine Amass, MD;  [facility];  Service: Urology;  Laterality: Bilateral;  . ROBOT ASSISTED LAPAROSCOPIC RADICAL PROSTATECTOMY N/A 01/19/2013   Procedure: ROBOTIC ASSISTED LAPAROSCOPIC RADICAL PROSTATECTOMY;  Surgeon: Bernestine Amass, MD;  [facility];  Service: Urology;  Laterality: N/A;  . SHOULDER OPEN ROTATOR CUFF REPAIR Left 1980's    Family History  Problem Relation Age of Onset  . Stroke Father     Died at 54  . Diabetes Mother   . Heart attack Brother     Died at 22  . Diabetes Sister   . Stomach cancer Brother   . Heart attack Sister     No Known Allergies  Current Outpatient Prescriptions on File Prior to Visit  Medication Sig Dispense Refill  . carvedilol (COREG) 3.125 MG tablet TAKE 1 TABLET BY MOUTH TWICE A DAY WITH A MEAL  3  . loratadine (CLARITIN) 10 MG tablet Take 1 tablet (10 mg total) by mouth every other day. 30 tablet 11  . nitroGLYCERIN (NITROSTAT) 0.4 MG SL tablet Place 1 tablet (0.4 mg total) under the tongue  every 5 (five) minutes as needed for chest pain (up to 3 doses). 25 tablet 2  . Nutritional Supplements (FEEDING SUPPLEMENT, NEPRO CARB STEADY,) LIQD Take 237 mLs by mouth 3 (three) times daily between meals. 90 Can 0  . rosuvastatin (CRESTOR) 20 MG tablet Take 1 tablet (20 mg total) by mouth daily. 30 tablet 3   No current facility-administered medications on file  prior to visit.     BP 132/72 (BP Location: Right Arm, Cuff Size: Normal)   Pulse 95   Temp 97.8 F (36.6 C) (Oral)   Resp 16   Ht 5\' 7"  (1.702 m)   Wt 143 lb 9.6 oz (65.1 kg)   SpO2 95% Comment: room air  BMI 22.49 kg/m       Objective:   Physical Exam  Constitutional: He is oriented to person, place, and time. He appears well-developed and well-nourished. No distress.  HENT:  Head: Normocephalic and atraumatic.  Cardiovascular: Normal rate and regular rhythm.   No murmur heard. Pulmonary/Chest: Effort normal and breath sounds normal. No respiratory distress. He has no wheezes. He has no rales.  Musculoskeletal: He exhibits no edema.  Neurological: He is alert and oriented to person, place, and time.  Wide based gait  Skin: Skin is warm and dry.     + abrasion right buttock.    Psychiatric: He has a normal mood and affect. His behavior is normal. Thought content normal.          Assessment & Plan:  Skin abrasion- advised pt to cover with antibiotic ointment and bandage.

## 2016-01-02 ENCOUNTER — Encounter: Payer: Medicare Other | Admitting: Physical Therapy

## 2016-01-02 ENCOUNTER — Ambulatory Visit: Payer: Medicare Other | Admitting: Nurse Practitioner

## 2016-01-03 ENCOUNTER — Other Ambulatory Visit: Payer: Medicare Other

## 2016-01-03 ENCOUNTER — Ambulatory Visit: Payer: Medicare Other | Admitting: Hematology & Oncology

## 2016-01-08 ENCOUNTER — Ambulatory Visit: Payer: Medicare Other | Admitting: Physical Therapy

## 2016-01-08 ENCOUNTER — Ambulatory Visit: Payer: Medicare Other | Admitting: Nurse Practitioner

## 2016-01-09 ENCOUNTER — Encounter: Payer: Self-pay | Admitting: Nurse Practitioner

## 2016-01-09 ENCOUNTER — Telehealth: Payer: Self-pay | Admitting: Family

## 2016-01-09 NOTE — Telephone Encounter (Signed)
[facility] is calling to get a copy of patients flu vaccine for this year. Please advise.   Caller: Beverlee Nims with [facility] Phone: [phone] Fax: [phone]

## 2016-01-09 NOTE — Telephone Encounter (Signed)
Vaccine record faxed to Dialysis center Attn: Beverlee Nims at [phone].

## 2016-01-10 ENCOUNTER — Ambulatory Visit: Payer: Medicare Other | Admitting: Physical Therapy

## 2016-01-10 LAB — HM DIABETES EYE EXAM

## 2016-01-16 ENCOUNTER — Encounter: Payer: Self-pay | Admitting: Family

## 2016-01-16 DIAGNOSIS — E11319 Type 2 diabetes mellitus with unspecified diabetic retinopathy without macular edema: Secondary | ICD-10-CM | POA: Insufficient documentation

## 2016-01-22 ENCOUNTER — Other Ambulatory Visit: Payer: Medicare Other

## 2016-04-01 ENCOUNTER — Ambulatory Visit: Payer: Medicare Other | Admitting: Family

## 2016-04-01 ENCOUNTER — Ambulatory Visit (INDEPENDENT_AMBULATORY_CARE_PROVIDER_SITE_OTHER): Payer: Medicare Other | Admitting: Physician Assistant

## 2016-04-01 ENCOUNTER — Encounter: Payer: Self-pay | Admitting: Physician Assistant

## 2016-04-01 ENCOUNTER — Telehealth: Payer: Self-pay | Admitting: Emergency Medicine

## 2016-04-01 VITALS — BP 106/69 | HR 106 | Temp 97.9°F | Resp 18 | Ht 67.0 in | Wt 141.8 lb

## 2016-04-01 DIAGNOSIS — E785 Hyperlipidemia, unspecified: Secondary | ICD-10-CM | POA: Diagnosis not present

## 2016-04-01 DIAGNOSIS — I639 Cerebral infarction, unspecified: Secondary | ICD-10-CM

## 2016-04-01 DIAGNOSIS — E1140 Type 2 diabetes mellitus with diabetic neuropathy, unspecified: Secondary | ICD-10-CM

## 2016-04-01 DIAGNOSIS — E1165 Type 2 diabetes mellitus with hyperglycemia: Secondary | ICD-10-CM

## 2016-04-01 DIAGNOSIS — IMO0002 Reserved for concepts with insufficient information to code with codable children: Secondary | ICD-10-CM

## 2016-04-01 DIAGNOSIS — R55 Syncope and collapse: Secondary | ICD-10-CM

## 2016-04-01 DIAGNOSIS — I10 Essential (primary) hypertension: Secondary | ICD-10-CM | POA: Diagnosis not present

## 2016-04-01 LAB — URINALYSIS, ROUTINE W REFLEX MICROSCOPIC
Leukocytes, UA: NEGATIVE
Nitrite: NEGATIVE
Specific Gravity, Urine: >=1.03 — AB (ref 1.000–1.030)
Urine Glucose: NEGATIVE
Urobilinogen, UA: 0.2 (ref 0.0–1.0)
pH: 5.5 (ref 5.0–8.0)

## 2016-04-01 LAB — CBC WITH DIFFERENTIAL/PLATELET
Basophils Absolute: 0 10*3/uL (ref 0.0–0.1)
Basophils Relative: 0.6 % (ref 0.0–3.0)
Eosinophils Absolute: 0.1 10*3/uL (ref 0.0–0.7)
Eosinophils Relative: 2.4 % (ref 0.0–5.0)
HCT: 34.8 % — ABNORMAL LOW (ref 39.0–52.0)
HEMOGLOBIN: 11.6 g/dL — AB (ref 13.0–17.0)
Lymphocytes Relative: 15.1 % (ref 12.0–46.0)
Lymphs Abs: 0.8 10*3/uL (ref 0.7–4.0)
MCHC: 33.5 g/dL (ref 30.0–36.0)
MCV: 98.4 fl (ref 78.0–100.0)
MONO ABS: 0.7 10*3/uL (ref 0.1–1.0)
Monocytes Relative: 12.8 % — ABNORMAL HIGH (ref 3.0–12.0)
Neutro Abs: 3.5 10*3/uL (ref 1.4–7.7)
Neutrophils Relative %: 69.1 % (ref 43.0–77.0)
Platelets: 240 10*3/uL (ref 150.0–400.0)
RBC: 3.53 Mil/uL — AB (ref 4.22–5.81)
RDW: 16.7 % — ABNORMAL HIGH (ref 11.5–15.5)
WBC: 5.1 10*3/uL (ref 4.0–10.5)

## 2016-04-01 LAB — BASIC METABOLIC PANEL
BUN: 35 mg/dL — ABNORMAL HIGH (ref 6–23)
CO2: 24 mEq/L (ref 19–32)
CREATININE: 8.74 mg/dL — AB (ref 0.40–1.50)
Calcium: 9.2 mg/dL (ref 8.4–10.5)
Chloride: 101 mEq/L (ref 96–112)
GFR: 7.8 mL/min — CL (ref >60.00)
Glucose, Bld: 154 mg/dL — ABNORMAL HIGH (ref 70–99)
POTASSIUM: 4.6 meq/L (ref 3.5–5.1)
SODIUM: 135 meq/L (ref 135–145)

## 2016-04-01 LAB — HEMOGLOBIN A1C: HEMOGLOBIN A1C: 5.3 % (ref 4.6–6.5)

## 2016-04-01 LAB — LIPID PANEL
CHOLESTEROL: 223 mg/dL — AB (ref 0–200)
HDL: 36 mg/dL — ABNORMAL LOW (ref >39.00)
LDL CALC: 154 mg/dL — AB (ref 0–99)
NonHDL: 186.61
TRIGLYCERIDES: 163 mg/dL — AB (ref 0.0–149.0)
Total CHOL/HDL Ratio: 6
VLDL: 32.6 mg/dL (ref 0.0–40.0)

## 2016-04-01 MED ORDER — CLOPIDOGREL BISULFATE 75 MG PO TABS: 75.0000 mg | ORAL_TABLET | Freq: Every day | ORAL | 1 refills | Status: DC

## 2016-04-01 NOTE — Addendum Note (Signed)
Addended by: Kelle Darting A on: 04/01/2016 04:46 PM   Modules accepted: Orders

## 2016-04-01 NOTE — Telephone Encounter (Signed)
Please call patient and tell him that he needs to resume his Plavix. Thanks!

## 2016-04-01 NOTE — Patient Instructions (Addendum)
Please go to the lab to complete your blood work.  Please call your GI doctor to ask about your heartburn medication, as they are managing that now.  Follow-up in 2 weeks for a blood pressure check with a nurse.

## 2016-04-01 NOTE — Progress Notes (Addendum)
Subjective:    Patient ID: [name], male    DOB: 1945/11/30, 71 y.o.   MRN: [id number]  HPI  [name] is a 71 y/o male who presents today for follow-up of:  1) HTN - currently maintained on Coreg 3.125 mg BID. Has not taken any blood pressure medicine for one month, he is unable to tell me why.  BP Readings from Last 3 Encounters:  04/01/16 106/69  01/01/16 132/72  12/25/15 165/91   2) HLD - maintained on Crestor 20 mg. Denies any muscle pain.  Lab Results  Component Value Date   CHOL 174 12/04/2015   HDL 31.90 (L) 12/04/2015   LDLCALC 109 (H) 12/04/2015   LDLDIRECT 117.0 09/25/2014   TRIG 163.0 (H) 12/04/2015   CHOLHDL 5 12/04/2015   3) Dm2 - diet controlled. Tries to limit sodas.   Lab Results  Component Value Date   HGBA1C 6.2 10/08/2015   4) Passed out - Yesterday during dialysis, he "fell out". Didn't hit his head. It was during the middle of dialysis. This was the first time this had happened, but then later during our visit he said it was the second. Normally eats before hemodialysis, but didn't yesterday because he wasn't hungry, he thinks that is contributing to it.  Wt Readings from Last 3 Encounters:  04/01/16 141 lb 12.8 oz (64.3 kg)  01/01/16 143 lb 9.6 oz (65.1 kg)  12/25/15 140 lb (63.5 kg)   5) CVA - Hx of CVA. Has not taken Plavix x 1 month, said he was told that Plavix makes his blood too thin. He is taking baby aspirin daily. Per chart review, he told GI that he was told to stop taking it from a different provider because of his rectal bleeding. Patient is a poor historian.  6) GERD - symptoms returned. He sees gastroenterology. Last seen on 01/11/16 for rectal bleeding   Review of Systems  See HPI  Past Medical History:  Diagnosis Date  . 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 Anemia, iron deficiency 03/24/2011   a. Recurrent GI bleed, tx with periodic iron infusions.  . 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 AVM (arteriovenous  malformation) of duodenum, acquired    egds in 01/2012, 04/2011  . BPH (benign prostatic hyperplasia)   . CAD (coronary artery disease)    a. Cath 09/2012: moderate borderline CAD in mid LAD/small diagonal branch, mild RCA stenosis, to be managed medically   . Chronic lower back pain   . Diabetic peripheral neuropathy ([place]) 2014   foot pain.  . Fatty liver    on CT of 11/2010  . GERD (gastroesophageal reflux disease)   . GI bleed    a. Recurrent GI bleed, tx with IV iron. b. Per heme notes - likely AVMs 04/2012 (tx with cauterization several months ago).  . Hematuria    a. Urology note scan from 07/2012: cystoscopy without evidence for bladder lesion, only lateral hypertrophy of posterior urethra, bladder impression from BPH. b. Pt states he had "some tests" scheduled for later in July 2014.  . High cholesterol   . Hypertension   . 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 Orthostatic hypotension    a. Tx with florinef.  . Peripheral neuropathy ([place])   . Polyp, colonic    Colonoscopy 01/2012 "benign" polyp  . Prostate cancer ([place])   . Sleep apnea    "had mask; couldn't sleep in it" (09/20/2012)  . Smoker   . Stage  III chronic kidney disease    a. Stage 3 (DM with complications ->CKD, peripheral neuropathy).  . Stroke ([place])    x 2  . Type II diabetes mellitus ([place])    a. Dx 1994, uncontrolled.      Social History   Social History  . Marital status: Married    Spouse name: N/A  . Number of children: 2  . Years of education: N/A   Occupational History  . taken out of work due to back    Social History Main Topics  . Smoking status: Current Some Day Smoker    Packs/day: 1.00    Years: 43.00    Types: Cigarettes    Start date: 04/15/1968  . Smokeless tobacco: Never Used     Comment: 07-22--2016  still smoking  . Alcohol use No     Comment: 09/20/2012 "Used to; stopped ~ 2009; never had problem w/it"  . Drug use: No  . Sexual activity: No   Other Topics Concern    . Not on file   Social History Narrative   Regular exercise: rides horses   Caffeine use: occasionally    Past Surgical History:  Procedure Laterality Date  . ENTEROSCOPY N/A 03/01/2013   Procedure: ENTEROSCOPY;  Surgeon: Inda Castle, MD;  Location: [facility];  Service: Endoscopy;  Laterality: N/A;  . GIVENS CAPSULE STUDY N/A 04/05/2015   Procedure: GIVENS CAPSULE STUDY;  Surgeon: Gatha Mayer, MD;  Location: [facility];  Service: Endoscopy;  Laterality: N/A;  . INGUINAL HERNIA REPAIR Right 2011  . LEFT HEART CATHETERIZATION WITH CORONARY ANGIOGRAM N/A 09/21/2012   Procedure: LEFT HEART CATHETERIZATION WITH CORONARY ANGIOGRAM;  Surgeon: Peter M Martinique, MD;  Location: [facility] CATH LAB;  Service: Cardiovascular;  Laterality: N/A;  . LUMBAR DISC SURGERY  1980's  . LYMPHADENECTOMY Bilateral 01/19/2013   Procedure: LYMPHADENECTOMY;  Surgeon: Bernestine Amass, MD;  Location: [facility];  Service: Urology;  Laterality: Bilateral;  . ROBOT ASSISTED LAPAROSCOPIC RADICAL PROSTATECTOMY N/A 01/19/2013   Procedure: ROBOTIC ASSISTED LAPAROSCOPIC RADICAL PROSTATECTOMY;  Surgeon: Bernestine Amass, MD;  Location: [facility];  Service: Urology;  Laterality: N/A;  . SHOULDER OPEN ROTATOR CUFF REPAIR Left 1980's    Family History  Problem Relation Age of Onset  . Stroke Father     Died at 26  . Diabetes Mother   . Heart attack Brother     Died at 68  . Diabetes Sister   . Stomach cancer Brother   . Heart attack Sister     No Known Allergies  Current Outpatient Prescriptions on File Prior to Visit  Medication Sig Dispense Refill  . carvedilol (COREG) 3.125 MG tablet TAKE 1 TABLET BY MOUTH TWICE A DAY WITH A MEAL  3  . clopidogrel (PLAVIX) 75 MG tablet Take 1 tablet (75 mg total) by mouth daily. 90 tablet 3  . loratadine (CLARITIN) 10 MG tablet Take 1 tablet (10 mg total) by mouth every other day. 30 tablet 11  . nitroGLYCERIN (NITROSTAT) 0.4 MG SL tablet Place 1 tablet (0.4 mg total) under the tongue  every 5 (five) minutes as needed for chest pain (up to 3 doses). 25 tablet 2  . Nutritional Supplements (FEEDING SUPPLEMENT, NEPRO CARB STEADY,) LIQD Take 237 mLs by mouth 3 (three) times daily between meals. 90 Can 0  . rosuvastatin (CRESTOR) 20 MG tablet Take 1 tablet (20 mg total) by mouth daily. 30 tablet 3   No current facility-administered medications on file prior to visit.  BP 106/69 (BP Location: Right Arm, Cuff Size: Normal)   Pulse (!) 106   Temp 97.9 F (36.6 C) (Oral)   Resp 18   Ht 5\' 7"  (1.702 m)   Wt 141 lb 12.8 oz (64.3 kg)   SpO2 100%   BMI 22.21 kg/m       Objective:   Physical Exam  Constitutional: He appears well-developed and well-nourished. He is cooperative.  HENT:  Head: Normocephalic and atraumatic.  Right Ear: Tympanic membrane, external ear and ear canal normal. Tympanic membrane is not erythematous, not retracted and not bulging.  Left Ear: Tympanic membrane, external ear and ear canal normal. Tympanic membrane is not erythematous, not retracted and not bulging.  Nose: Nose normal.  Mouth/Throat: No posterior oropharyngeal edema or posterior oropharyngeal erythema.  Cardiovascular: Normal rate, regular rhythm and normal heart sounds.   Pulmonary/Chest: Effort normal and breath sounds normal. No accessory muscle usage. No respiratory distress. He has no decreased breath sounds. He has no wheezes. He has no rhonchi. He has no rales.  Neurological: He is alert. He has normal strength. No cranial nerve deficit or sensory deficit. GCS eye subscore is 4. GCS verbal subscore is 5. GCS motor subscore is 6.  Nursing note and vitals reviewed.   EKG tracing is personally reviewed.  EKG notes NSR.  No acute changes.      Assessment & Plan:  1. Hyperlipidemia, unspecified hyperlipidemia type Lipid panel today. Continue Crestor 20 mg, stable. Will make any necessary changes based on lipid results.  2. Essential hypertension Routine labs pending. Blood  pressure low for patient today, currently 106/69 and patient has not had any blood pressure medication in at least 1 week. Continue to hold blood pressure medications, and follow-up in two weeks for nurse visit to re-check blood pressure.   3. Type 2 diabetes, uncontrolled, with neuropathy ([name]) Last HgbA1c 6.2 in July 2017. Repeat labs today. Encouraged decreased soda intake.  4. Syncope, unspecified syncope type Neuro exam benign. EKG today similar to previous EKG. I have put in a referral for patient to see his cardiologist. Stressed the importance of following up with cardiology and keeping his appointment.  5. CVA Per extensive chart review, I do not see any provider that has told patient to stop his Plavix, I think there may have been confusion when he had rectal bleeding and was possibly told to briefly stop it. However, after discussion with patient's PCP, [name], will resume Plavix at this time, given his CVA history. Continue ASA.  6. GERD  Follow-up with GI. Avoid trigger foods.    Inda Coke PA-C 04/01/16

## 2016-04-01 NOTE — Telephone Encounter (Signed)
Notified pt. He requests that we send refill. Refills sent.

## 2016-04-01 NOTE — Telephone Encounter (Signed)
Gill from [employer] called critical Creat 8.74 & GFR 7.8, reported to [employer] verbally.

## 2016-04-01 NOTE — Progress Notes (Signed)
Pre visit review using our clinic review tool, if applicable. No additional management support is needed unless otherwise documented below in the visit note. 

## 2016-04-04 ENCOUNTER — Other Ambulatory Visit: Payer: Self-pay | Admitting: Physician Assistant

## 2016-04-04 MED ORDER — ROSUVASTATIN CALCIUM 40 MG PO TABS: 40.0000 mg | ORAL_TABLET | Freq: Every day | ORAL | 0 refills | Status: DC

## 2016-04-09 LAB — HM DIABETES EYE EXAM

## 2016-04-15 ENCOUNTER — Ambulatory Visit (INDEPENDENT_AMBULATORY_CARE_PROVIDER_SITE_OTHER): Payer: Medicare Other | Admitting: Family

## 2016-04-15 VITALS — BP 138/71 | HR 99

## 2016-04-15 DIAGNOSIS — I10 Essential (primary) hypertension: Secondary | ICD-10-CM

## 2016-04-15 NOTE — Progress Notes (Signed)
Noted and agree. 

## 2016-04-15 NOTE — Patient Instructions (Addendum)
Per Inda Castle, NP: Continue current medication & regimen. Follow-up with PCP in 3 months for an office visit.

## 2016-04-15 NOTE — Progress Notes (Signed)
Pre visit review using our clinic review tool, if applicable. No additional management support is needed unless otherwise documented below in the visit note.  Patient came in office for blood pressure check per OV note 04/01/16. Reviewed medication & regimen with the patient. Today's readings were as follow: BP 138/71 P 99 O2 98%.  Per Inda Castle, NP:  Continue current medication & regimen. Follow-up with PCP in 3 months for an office visit.  Informed patient of the provider's recommendations. He verbalized understanding.  Next appointment 07/15/16 at 9:30 AM.

## 2016-04-16 NOTE — Progress Notes (Deleted)
Cardiology Office Note    Date:  04/16/2016   ID:  [name], DOB Oct 17, 1945, MRN [id number]  PCP:  [name], NP  Cardiologist:  Seen by Dr. Martinique in 2014   No chief complaint on file.   History of Present Illness:    [name] is a 71 y.o. male with past medical history of HTN, HLD, Type 2 DM, ESRD, prior CVA   Was seen by PCP on 04/01/2016 and repoted that during dialysis the day prior, he "fell out". Had not consumed anything to eat prior to his session, which he normally does.  An EKG was obtained at the time of his appointment which showed NSR, HR 98, with LVH.    Past Medical History:  Diagnosis Date  . 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 Anemia, iron deficiency 03/24/2011   a. Recurrent GI bleed, tx with periodic iron infusions.  . 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 AVM (arteriovenous malformation) of duodenum, acquired    egds in 01/2012, 04/2011  . BPH (benign prostatic hyperplasia)   . CAD (coronary artery disease)    a. Cath 09/2012: moderate borderline CAD in mid LAD/small diagonal branch, mild RCA stenosis, to be managed medically   . Chronic lower back pain   . Diabetic peripheral neuropathy ([place]) 2014   foot pain.  . Fatty liver    on CT of 11/2010  . GERD (gastroesophageal reflux disease)   . GI bleed    a. Recurrent GI bleed, tx with IV iron. b. Per heme notes - likely AVMs 04/2012 (tx with cauterization several months ago).  . Hematuria    a. Urology note scan from 07/2012: cystoscopy without evidence for bladder lesion, only lateral hypertrophy of posterior urethra, bladder impression from BPH. b. Pt states he had "some tests" scheduled for later in July 2014.  . High cholesterol   . Hypertension   . 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 Orthostatic hypotension    a. Tx with florinef.  . Peripheral neuropathy ([place])   . Polyp, colonic    Colonoscopy 01/2012 "benign" polyp  . Prostate cancer ([place])   . Sleep  apnea    "had mask; couldn't sleep in it" (09/20/2012)  . Smoker   . Stage III chronic kidney disease    a. Stage 3 (DM with complications ->CKD, peripheral neuropathy).  . Stroke ([place])    x 2  . Type II diabetes mellitus ([place])    a. Dx 1994, uncontrolled.     Past Surgical History:  Procedure Laterality Date  . ENTEROSCOPY N/A 03/01/2013   Procedure: ENTEROSCOPY;  Surgeon: Inda Castle, MD;  Location: [facility];  Service: Endoscopy;  Laterality: N/A;  . GIVENS CAPSULE STUDY N/A 04/05/2015   Procedure: GIVENS CAPSULE STUDY;  Surgeon: Gatha Mayer, MD;  Location: [facility];  Service: Endoscopy;  Laterality: N/A;  . INGUINAL HERNIA REPAIR Right 2011  . LEFT HEART CATHETERIZATION WITH CORONARY ANGIOGRAM N/A 09/21/2012   Procedure: LEFT HEART CATHETERIZATION WITH CORONARY ANGIOGRAM;  Surgeon: Peter M Martinique, MD;  Location: [facility] CATH LAB;  Service: Cardiovascular;  Laterality: N/A;  . LUMBAR DISC SURGERY  1980's  . LYMPHADENECTOMY Bilateral 01/19/2013   Procedure: LYMPHADENECTOMY;  Surgeon: Bernestine Amass, MD;  [facility];  Service: Urology;  Laterality: Bilateral;  . ROBOT ASSISTED LAPAROSCOPIC RADICAL PROSTATECTOMY N/A 01/19/2013   Procedure: ROBOTIC ASSISTED LAPAROSCOPIC RADICAL PROSTATECTOMY;  Surgeon: Bernestine Amass, MD;  Location: [facility];  Service:  Urology;  Laterality: N/A;  . SHOULDER OPEN ROTATOR CUFF REPAIR Left 1980's    Current Medications: Outpatient Medications Prior to Visit  Medication Sig Dispense Refill  . carvedilol (COREG) 3.125 MG tablet TAKE 1 TABLET BY MOUTH TWICE A DAY WITH A MEAL  3  . clopidogrel (PLAVIX) 75 MG tablet Take 1 tablet (75 mg total) by mouth daily. 90 tablet 1  . loratadine (CLARITIN) 10 MG tablet Take 1 tablet (10 mg total) by mouth every other day. 30 tablet 11  . nitroGLYCERIN (NITROSTAT) 0.4 MG SL tablet Place 1 tablet (0.4 mg total) under the tongue every 5 (five) minutes as needed for chest pain (up to 3 doses). 25 tablet 2  .  Nutritional Supplements (FEEDING SUPPLEMENT, NEPRO CARB STEADY,) LIQD Take 237 mLs by mouth 3 (three) times daily between meals. 90 Can 0  . rosuvastatin (CRESTOR) 40 MG tablet Take 1 tablet (40 mg total) by mouth daily. 90 tablet 0   No facility-administered medications prior to visit.      Allergies:   Patient has no known allergies.   Social History   Social History  . Marital status: Married    Spouse name: N/A  . Number of children: 2  . Years of education: N/A   Occupational History  . taken out of work due to back    Social History Main Topics  . Smoking status: Current Some Day Smoker    Packs/day: 1.00    Years: 43.00    Types: Cigarettes    Start date: 04/15/1968  . Smokeless tobacco: Never Used     Comment: 07-22--2016  still smoking  . Alcohol use No     Comment: 09/20/2012 "Used to; stopped ~ 2009; never had problem w/it"  . Drug use: No  . Sexual activity: No   Other Topics Concern  . Not on file   Social History Narrative   Regular exercise: rides horses   Caffeine use: occasionally     Family History:  The patient's ***family history includes Diabetes in his mother and sister; Heart attack in his brother and sister; Stomach cancer in his brother; Stroke in his father.   Review of Systems:   Please see the history of present illness.     General:  No chills, fever, night sweats or weight changes.  Cardiovascular:  No chest pain, dyspnea on exertion, edema, orthopnea, palpitations, paroxysmal nocturnal dyspnea. Dermatological: No rash, lesions/masses Respiratory: No cough, dyspnea Urologic: No hematuria, dysuria Abdominal:   No nausea, vomiting, diarrhea, bright red blood per rectum, melena, or hematemesis Neurologic:  No visual changes, wkns, changes in mental status. All other systems reviewed and are otherwise negative except as noted above.   Physical Exam:    VS:  There were no vitals taken for this visit.   General: Well developed, well  nourished,male appearing in no acute distress. Head: Normocephalic, atraumatic, sclera non-icteric, no xanthomas, nares are without discharge.  Neck: No carotid bruits. JVD not elevated.  Lungs: Respirations regular and unlabored, without wheezes or rales.  Heart: ***Regular rate and rhythm. No S3 or S4.  No murmur, no rubs, or gallops appreciated. Abdomen: Soft, non-tender, non-distended with normoactive bowel sounds. No hepatomegaly. No rebound/guarding. No obvious abdominal masses. Msk:  Strength and tone appear normal for age. No joint deformities or effusions. Extremities: No clubbing or cyanosis. No edema.  Distal pedal pulses are 2+ bilaterally. Neuro: Alert and oriented X 3. Moves all extremities spontaneously. No focal deficits noted. Psych:  Responds to questions appropriately with a normal affect. Skin: No rashes or lesions noted  Wt Readings from Last 3 Encounters:  04/01/16 141 lb 12.8 oz (64.3 kg)  01/01/16 143 lb 9.6 oz (65.1 kg)  12/25/15 140 lb (63.5 kg)        Studies/Labs Reviewed:   EKG:  EKG is*** ordered today.  The ekg ordered today demonstrates ***  Recent Labs: 12/13/2015: ALT(SGPT) 17 04/01/2016: BUN 35; Creatinine, Ser 8.74; Hemoglobin 11.6; Platelets 240.0; Potassium 4.6; Sodium 135   Lipid Panel    Component Value Date/Time   CHOL 223 (H) 04/01/2016 0939   TRIG 163.0 (H) 04/01/2016 0939   HDL 36.00 (L) 04/01/2016 0939   CHOLHDL 6 04/01/2016 0939   VLDL 32.6 04/01/2016 0939   LDLCALC 154 (H) 04/01/2016 0939   LDLDIRECT 117.0 09/25/2014 0908    Additional studies/ records that were reviewed today include:  ***  Assessment:    No diagnosis found.   Plan:   In order of problems listed above:  1. ***    Medication Adjustments/Labs and Tests Ordered: Current medicines are reviewed at length with the patient today.  Concerns regarding medicines are outlined above.  Medication changes, Labs and Tests ordered today are listed in the Patient  Instructions below. There are no Patient Instructions on file for this visit.   Signed, Erma Heritage, PA  04/16/2016 8:16 PM    [facility] [place], [place], [place]  [address] Phone: [phone]; Fax: [phone]  [address] [place], [place] [address] Phone: [phone]

## 2016-04-17 ENCOUNTER — Encounter: Payer: Self-pay | Admitting: *Deleted

## 2016-04-17 ENCOUNTER — Ambulatory Visit: Payer: Medicare Other | Admitting: Student

## 2016-04-29 ENCOUNTER — Telehealth: Payer: Self-pay | Admitting: Family

## 2016-04-29 NOTE — Telephone Encounter (Signed)
Caller name: Pendelton,Emma L Relation to pt: spouse  Call back number:[phone]   Reason for call:  Wife would like to speak with nurse regarding patient plan care stating since patient started dialysis he has been depressed, please advise

## 2016-04-29 NOTE — Telephone Encounter (Signed)
Per wife pt seems depressed, pt is not eating,pt is saying that he's going to die, upset that he can not do for himself like he is use to, Pt does not think he is depressed. scheduled pt and wife for appointment on 05/09/16 to discuss depression.

## 2016-04-29 NOTE — Telephone Encounter (Signed)
Tried to reach pt. Left vm to call back.

## 2016-04-30 ENCOUNTER — Encounter: Payer: Self-pay | Admitting: Medical

## 2016-04-30 ENCOUNTER — Ambulatory Visit (INDEPENDENT_AMBULATORY_CARE_PROVIDER_SITE_OTHER): Payer: Medicare Other | Admitting: Medical

## 2016-04-30 ENCOUNTER — Ambulatory Visit (HOSPITAL_BASED_OUTPATIENT_CLINIC_OR_DEPARTMENT_OTHER)
Admission: RE | Admit: 2016-04-30 | Discharge: 2016-04-30 | Disposition: A | Discharge status: Home / Self Care | Payer: Medicare Other | Source: Ambulatory Visit | Attending: Medical | Admitting: Medical

## 2016-04-30 ENCOUNTER — Other Ambulatory Visit: Payer: Medicare Other

## 2016-04-30 VITALS — BP 117/56 | HR 95 | Temp 98.1°F | Resp 16 | Ht 67.0 in | Wt 138.0 lb

## 2016-04-30 DIAGNOSIS — R6889 Other general symptoms and signs: Secondary | ICD-10-CM | POA: Diagnosis not present

## 2016-04-30 DIAGNOSIS — I700 Atherosclerosis of aorta: Secondary | ICD-10-CM | POA: Insufficient documentation

## 2016-04-30 DIAGNOSIS — R05 Cough: Secondary | ICD-10-CM

## 2016-04-30 DIAGNOSIS — I639 Cerebral infarction, unspecified: Secondary | ICD-10-CM

## 2016-04-30 DIAGNOSIS — M791 Myalgia, unspecified site: Secondary | ICD-10-CM

## 2016-04-30 DIAGNOSIS — R5383 Other fatigue: Secondary | ICD-10-CM | POA: Diagnosis not present

## 2016-04-30 DIAGNOSIS — J40 Bronchitis, not specified as acute or chronic: Secondary | ICD-10-CM

## 2016-04-30 DIAGNOSIS — R059 Cough, unspecified: Secondary | ICD-10-CM

## 2016-04-30 DIAGNOSIS — D649 Anemia, unspecified: Secondary | ICD-10-CM | POA: Diagnosis not present

## 2016-04-30 LAB — CBC WITH DIFFERENTIAL/PLATELET
BASOS PCT: 1 %
Basophils Absolute: 67 cells/uL (ref 0–200)
EOS ABS: 201 {cells}/uL (ref 15–500)
Eosinophils Relative: 3 %
HEMATOCRIT: 26.6 % — AB (ref 38.5–50.0)
Hemoglobin: 8.9 g/dL — ABNORMAL LOW (ref 13.2–17.1)
LYMPHS PCT: 21 %
Lymphs Abs: 1407 cells/uL (ref 850–3900)
MCH: 31.1 pg (ref 27.0–33.0)
MCHC: 33.5 g/dL (ref 32.0–36.0)
MCV: 93 fL (ref 80.0–100.0)
MONO ABS: 1340 {cells}/uL — AB (ref 200–950)
MPV: 10.8 fL (ref 7.5–12.5)
Monocytes Relative: 20 %
NEUTROS PCT: 55 %
Neutro Abs: 3685 cells/uL (ref 1500–7800)
Platelets: 217 10*3/uL (ref 140–400)
RBC: 2.86 MIL/uL — ABNORMAL LOW (ref 4.20–5.80)
RDW: 16.1 % — AB (ref 11.0–15.0)
WBC: 6.7 10*3/uL (ref 3.8–10.8)

## 2016-04-30 LAB — POC INFLUENZA A&B (BINAX/QUICKVUE)
INFLUENZA A, POC: NEGATIVE
Influenza B, POC: NEGATIVE

## 2016-04-30 LAB — POCT RAPID STREP A (OFFICE): Rapid Strep A Screen: NEGATIVE

## 2016-04-30 MED ORDER — BENZONATATE 100 MG PO CAPS: 100.0000 mg | ORAL_CAPSULE | Freq: Three times a day (TID) | ORAL | 0 refills | Status: DC | PRN

## 2016-04-30 MED ORDER — DOXYCYCLINE HYCLATE 100 MG PO TABS: 100.0000 mg | ORAL_TABLET | Freq: Two times a day (BID) | ORAL | 0 refills | Status: DC

## 2016-04-30 NOTE — Progress Notes (Signed)
Pre visit review using our clinic review tool, if applicable. No additional management support is needed unless otherwise documented below in the visit note/SLS  

## 2016-04-30 NOTE — Patient Instructions (Addendum)
Your rapid strep and flu test was negative.(some possilbe mild flu symptoms but past 48 hour onset)  You appear to have bronchitis and possible sinusitis. Will rx doxycycline antibiotic and benzonatate for cough  Will get cxr to see if any pneumonia seen. Repeat cbc to assess wbc and hb/hct.   Will try to inform you provider planning to do procedure of results. But also show them this summary  Follow up in 7-10 days or as needed

## 2016-04-30 NOTE — Progress Notes (Signed)
Subjective:    Patient ID: [name], male    DOB: 10-20-1945, 71 y.o.   MRN: [id number]  HPI  Pt in with some head congestion, nasal congestion,  sinus pressure and chest congeston. Some pnd and some productive cough(All for about a week). Pt 1 day ago went to [facility] and dx with uri. No antibotic given.   Pt does report fatigue and bodyaches 3-4 days.  Pt is on dialysis.  Pt is going to have revision of av fistula tomorrow.(yesterday he had a prep visit)  Pt had dialysis this morning.  Hx of anemia and on  pror capusle endcospy avm small bowel but not bleeding.  Review of Systems  Constitutional: Positive for chills, fatigue and fever.  HENT: Positive for congestion, sinus pain and sinus pressure.   Respiratory: Positive for cough. Negative for shortness of breath and wheezing.   Cardiovascular: Negative for chest pain and palpitations.  Gastrointestinal: Negative for abdominal distention, abdominal pain, constipation, diarrhea, nausea and vomiting.  Musculoskeletal: Positive for myalgias. Negative for back pain and neck pain.       See hpi.  Neurological: Negative for dizziness and headaches.  Hematological: Negative for adenopathy. Does not bruise/bleed easily.  Psychiatric/Behavioral: Negative for behavioral problems and confusion.    Past Medical History:  Diagnosis Date  . 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 Anemia, iron deficiency 03/24/2011   a. Recurrent GI bleed, tx with periodic iron infusions.  . 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 AVM (arteriovenous malformation) of duodenum, acquired    egds in 01/2012, 04/2011  . BPH (benign prostatic hyperplasia)   . CAD (coronary artery disease)    a. Cath 09/2012: moderate borderline CAD in mid LAD/small diagonal branch, mild RCA stenosis, to be managed medically   . Chronic lower back pain   . Diabetic peripheral neuropathy ([place]) 2014   foot pain.  . Fatty liver    on CT of 11/2010  . GERD  (gastroesophageal reflux disease)   . GI bleed    a. Recurrent GI bleed, tx with IV iron. b. Per heme notes - likely AVMs 04/2012 (tx with cauterization several months ago).  . Hematuria    a. Urology note scan from 07/2012: cystoscopy without evidence for bladder lesion, only lateral hypertrophy of posterior urethra, bladder impression from BPH. b. Pt states he had "some tests" scheduled for later in July 2014.  . High cholesterol   . Hypertension   . 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 Orthostatic hypotension    a. Tx with florinef.  . Peripheral neuropathy ([place])   . Polyp, colonic    Colonoscopy 01/2012 "benign" polyp  . Prostate cancer ([place])   . Sleep apnea    "had mask; couldn't sleep in it" (09/20/2012)  . Smoker   . Stage III chronic kidney disease    a. Stage 3 (DM with complications ->CKD, peripheral neuropathy).  . Stroke ([place])    x 2  . Type II diabetes mellitus ([place])    a. Dx 1994, uncontrolled.      Social History   Social History  . Marital status: Married    Spouse name: N/A  . Number of children: 2  . Years of education: N/A   Occupational History  . taken out of work due to back    Social History Main Topics  . Smoking status: Current Some Day Smoker    Packs/day: 1.00    Years: 43.00  Types: Cigarettes    Start date: 04/15/1968  . Smokeless tobacco: Never Used     Comment: 07-22--2016  still smoking  . Alcohol use No     Comment: 09/20/2012 "Used to; stopped ~ 2009; never had problem w/it"  . Drug use: No  . Sexual activity: No   Other Topics Concern  . Not on file   Social History Narrative   Regular exercise: rides horses   Caffeine use: occasionally    Past Surgical History:  Procedure Laterality Date  . ENTEROSCOPY N/A 03/01/2013   Procedure: ENTEROSCOPY;  Surgeon: Inda Castle, MD;  Location: [place];  Service: Endoscopy;  Laterality: N/A;  . GIVENS CAPSULE STUDY N/A 04/05/2015   Procedure: GIVENS CAPSULE STUDY;   Surgeon: Gatha Mayer, MD;  Location: [facility];  Service: Endoscopy;  Laterality: N/A;  . INGUINAL HERNIA REPAIR Right 2011  . LEFT HEART CATHETERIZATION WITH CORONARY ANGIOGRAM N/A 09/21/2012   Procedure: LEFT HEART CATHETERIZATION WITH CORONARY ANGIOGRAM;  Surgeon: Peter M Martinique, MD;  [facility];  Service: Cardiovascular;  Laterality: N/A;  . LUMBAR DISC SURGERY  1980's  . LYMPHADENECTOMY Bilateral 01/19/2013   Procedure: LYMPHADENECTOMY;  Surgeon: Bernestine Amass, MD;  Location: [facility];  Service: Urology;  Laterality: Bilateral;  . ROBOT ASSISTED LAPAROSCOPIC RADICAL PROSTATECTOMY N/A 01/19/2013   Procedure: ROBOTIC ASSISTED LAPAROSCOPIC RADICAL PROSTATECTOMY;  Surgeon: Bernestine Amass, MD;  Location: [facility];  Service: Urology;  Laterality: N/A;  . SHOULDER OPEN ROTATOR CUFF REPAIR Left 1980's    Family History  Problem Relation Age of Onset  . Stroke Father     Died at 27  . Diabetes Mother   . Heart attack Brother     Died at 45  . Diabetes Sister   . Stomach cancer Brother   . Heart attack Sister     No Known Allergies  Current Outpatient Prescriptions on File Prior to Visit  Medication Sig Dispense Refill  . carvedilol (COREG) 3.125 MG tablet TAKE 1 TABLET BY MOUTH TWICE A DAY WITH A MEAL  3  . clopidogrel (PLAVIX) 75 MG tablet Take 1 tablet (75 mg total) by mouth daily. 90 tablet 1  . loratadine (CLARITIN) 10 MG tablet Take 1 tablet (10 mg total) by mouth every other day. 30 tablet 11  . nitroGLYCERIN (NITROSTAT) 0.4 MG SL tablet Place 1 tablet (0.4 mg total) under the tongue every 5 (five) minutes as needed for chest pain (up to 3 doses). 25 tablet 2  . Nutritional Supplements (FEEDING SUPPLEMENT, NEPRO CARB STEADY,) LIQD Take 237 mLs by mouth 3 (three) times daily between meals. 90 Can 0  . rosuvastatin (CRESTOR) 40 MG tablet Take 1 tablet (40 mg total) by mouth daily. 90 tablet 0   No current facility-administered medications on file prior to visit.     BP  (!) 117/56 (BP Location: Right Arm, Patient Position: Sitting, Cuff Size: Normal)   Pulse 95   Temp 98.1 F (36.7 C) (Oral)   Resp 16   Ht 5\' 7"  (1.702 m)   Wt 138 lb (62.6 kg)   SpO2 98%   BMI 21.61 kg/m      Objective:   Physical Exam  General  Mental Status - Alert. General Appearance - Well groomed. Not in acute distress.  Skin Rashes- No Rashes.  HEENT Head- Normal. Ear Auditory Canal - Left- Normal. Right - Normal.Tympanic Membrane- Left- Normal. Right- Normal. Eye Sclera/Conjunctiva- Left- Normal. Right- Normal. Nose & Sinuses Nasal  Mucosa- Left-  Boggy and Congested. Right-  Boggy and  Congested.Bilateral maxillary and frontal sinus pressure. Mouth & Throat Lips: Upper Lip- Normal: no dryness, cracking, pallor, cyanosis, or vesicular eruption. Lower Lip-Normal: no dryness, cracking, pallor, cyanosis or vesicular eruption. Buccal Mucosa- Bilateral- No Aphthous ulcers. Oropharynx- No Discharge or Erythema. Tonsils: Characteristics- Bilateral- No Erythema or Congestion. Size/Enlargement- Bilateral- No enlargement. Discharge- bilateral-None.  Neck Neck- Supple. No Masses.   Chest and Lung Exam Auscultation: Breath Sounds:- even and unlabored. Some course/rough rt lower lung field at the base.  Cardiovascular Auscultation:Rythm- Regular, rate and rhythm. Murmurs & Other Heart Sounds:Ausculatation of the heart reveal- No Murmurs.  Lymphatic Head & Neck General Head & Neck Lymphatics: Bilateral: Description- No Localized lymphadenopathy.  Anterior thorax- pt points out rt nipple swelling. On palpaton if appears to be mild gynecomastia.      Assessment & Plan:  Your rapid strep and flu test was negative.(some possilbe mild flu symptoms but past 48 hour onset)  You appear to have bronchitis and possible sinusitis. Will rx doxycycline antibiotic and benzonatate for cough  Will get cxr to see if any pneumonia seen. Repeat cbc to assess wbc and hb/hct.   Will  try to inform you provider planning to do procedure of results. But also show them this summary  Follow up in 7-10 days or as needed  Per pharmacy doxy considered safe in dialysis pt.  Reviewed pt hb/hematocrit yesterday 8.8/26.2. Will compare today values.  Dewitte Vannice, Percell Miller, PA-C

## 2016-05-01 ENCOUNTER — Telehealth: Payer: Self-pay

## 2016-05-01 ENCOUNTER — Encounter: Payer: Self-pay | Admitting: Family

## 2016-05-01 NOTE — Telephone Encounter (Signed)
Patients wife called back for lab results. Results given.

## 2016-05-09 ENCOUNTER — Ambulatory Visit: Payer: Medicare Other | Admitting: Family

## 2016-05-16 NOTE — Telephone Encounter (Signed)
Patient did not read my response to the [employer]. Could you please contact him and see how his leg is feeling.  I am happy to see him in the office for evaluation if it is still bothering him.

## 2016-05-28 ENCOUNTER — Encounter: Payer: Self-pay | Admitting: Family

## 2016-05-29 LAB — HM DIABETES EYE EXAM

## 2016-06-13 ENCOUNTER — Emergency Department (HOSPITAL_BASED_OUTPATIENT_CLINIC_OR_DEPARTMENT_OTHER): Payer: Medicare Other

## 2016-06-13 ENCOUNTER — Inpatient Hospital Stay (HOSPITAL_COMMUNITY): Payer: Medicare Other

## 2016-06-13 ENCOUNTER — Emergency Department (HOSPITAL_COMMUNITY): Payer: Medicare Other

## 2016-06-13 ENCOUNTER — Encounter (HOSPITAL_BASED_OUTPATIENT_CLINIC_OR_DEPARTMENT_OTHER): Payer: Self-pay | Admitting: Emergency Medicine

## 2016-06-13 ENCOUNTER — Emergency Department (HOSPITAL_BASED_OUTPATIENT_CLINIC_OR_DEPARTMENT_OTHER)
Admission: EM | Admit: 2016-06-13 | Discharge: 2016-06-13 | Disposition: A | Discharge status: Home / Self Care | Payer: Medicare Other | Source: Home / Self Care | Attending: Emergency Medicine | Admitting: Emergency Medicine

## 2016-06-13 ENCOUNTER — Inpatient Hospital Stay (HOSPITAL_COMMUNITY)
Admission: EM | Admit: 2016-06-13 | Discharge: 2016-06-15 | DRG: 064 | Disposition: A | Discharge status: Home Health | Payer: Medicare Other | Attending: Nephrology | Admitting: Nephrology

## 2016-06-13 ENCOUNTER — Encounter (HOSPITAL_COMMUNITY): Payer: Self-pay | Admitting: *Deleted

## 2016-06-13 DIAGNOSIS — Z79899 Other long term (current) drug therapy: Secondary | ICD-10-CM

## 2016-06-13 DIAGNOSIS — R29898 Other symptoms and signs involving the musculoskeletal system: Secondary | ICD-10-CM

## 2016-06-13 DIAGNOSIS — I10 Essential (primary) hypertension: Secondary | ICD-10-CM

## 2016-06-13 DIAGNOSIS — K31819 Angiodysplasia of stomach and duodenum without bleeding: Secondary | ICD-10-CM | POA: Diagnosis present

## 2016-06-13 DIAGNOSIS — F17210 Nicotine dependence, cigarettes, uncomplicated: Secondary | ICD-10-CM | POA: Diagnosis present

## 2016-06-13 DIAGNOSIS — N189 Chronic kidney disease, unspecified: Secondary | ICD-10-CM

## 2016-06-13 DIAGNOSIS — E1122 Type 2 diabetes mellitus with diabetic chronic kidney disease: Secondary | ICD-10-CM

## 2016-06-13 DIAGNOSIS — N400 Enlarged prostate without lower urinary tract symptoms: Secondary | ICD-10-CM | POA: Diagnosis present

## 2016-06-13 DIAGNOSIS — K5520 Angiodysplasia of colon without hemorrhage: Secondary | ICD-10-CM | POA: Diagnosis present

## 2016-06-13 DIAGNOSIS — Z8546 Personal history of malignant neoplasm of prostate: Secondary | ICD-10-CM

## 2016-06-13 DIAGNOSIS — D649 Anemia, unspecified: Secondary | ICD-10-CM | POA: Diagnosis not present

## 2016-06-13 DIAGNOSIS — G4733 Obstructive sleep apnea (adult) (pediatric): Secondary | ICD-10-CM | POA: Diagnosis present

## 2016-06-13 DIAGNOSIS — Z7902 Long term (current) use of antithrombotics/antiplatelets: Secondary | ICD-10-CM

## 2016-06-13 DIAGNOSIS — K760 Fatty (change of) liver, not elsewhere classified: Secondary | ICD-10-CM | POA: Diagnosis present

## 2016-06-13 DIAGNOSIS — Z823 Family history of stroke: Secondary | ICD-10-CM

## 2016-06-13 DIAGNOSIS — E1151 Type 2 diabetes mellitus with diabetic peripheral angiopathy without gangrene: Secondary | ICD-10-CM | POA: Diagnosis present

## 2016-06-13 DIAGNOSIS — I6789 Other cerebrovascular disease: Secondary | ICD-10-CM | POA: Diagnosis not present

## 2016-06-13 DIAGNOSIS — I2510 Atherosclerotic heart disease of native coronary artery without angina pectoris: Secondary | ICD-10-CM | POA: Diagnosis present

## 2016-06-13 DIAGNOSIS — I639 Cerebral infarction, unspecified: Principal | ICD-10-CM | POA: Diagnosis present

## 2016-06-13 DIAGNOSIS — N2581 Secondary hyperparathyroidism of renal origin: Secondary | ICD-10-CM | POA: Diagnosis present

## 2016-06-13 DIAGNOSIS — E785 Hyperlipidemia, unspecified: Secondary | ICD-10-CM | POA: Diagnosis present

## 2016-06-13 DIAGNOSIS — I120 Hypertensive chronic kidney disease with stage 5 chronic kidney disease or end stage renal disease: Secondary | ICD-10-CM | POA: Diagnosis present

## 2016-06-13 DIAGNOSIS — R42 Dizziness and giddiness: Secondary | ICD-10-CM | POA: Insufficient documentation

## 2016-06-13 DIAGNOSIS — I69391 Dysphagia following cerebral infarction: Secondary | ICD-10-CM | POA: Diagnosis not present

## 2016-06-13 DIAGNOSIS — I69351 Hemiplegia and hemiparesis following cerebral infarction affecting right dominant side: Secondary | ICD-10-CM | POA: Diagnosis not present

## 2016-06-13 DIAGNOSIS — Z8249 Family history of ischemic heart disease and other diseases of the circulatory system: Secondary | ICD-10-CM

## 2016-06-13 DIAGNOSIS — I248 Other forms of acute ischemic heart disease: Secondary | ICD-10-CM | POA: Diagnosis present

## 2016-06-13 DIAGNOSIS — M779 Enthesopathy, unspecified: Secondary | ICD-10-CM

## 2016-06-13 DIAGNOSIS — E7800 Pure hypercholesterolemia, unspecified: Secondary | ICD-10-CM | POA: Diagnosis present

## 2016-06-13 DIAGNOSIS — R1310 Dysphagia, unspecified: Secondary | ICD-10-CM | POA: Diagnosis present

## 2016-06-13 DIAGNOSIS — N186 End stage renal disease: Secondary | ICD-10-CM

## 2016-06-13 DIAGNOSIS — Z833 Family history of diabetes mellitus: Secondary | ICD-10-CM

## 2016-06-13 DIAGNOSIS — Z992 Dependence on renal dialysis: Secondary | ICD-10-CM

## 2016-06-13 DIAGNOSIS — K219 Gastro-esophageal reflux disease without esophagitis: Secondary | ICD-10-CM | POA: Diagnosis present

## 2016-06-13 DIAGNOSIS — R69 Illness, unspecified: Secondary | ICD-10-CM

## 2016-06-13 DIAGNOSIS — M76891 Other specified enthesopathies of right lower limb, excluding foot: Secondary | ICD-10-CM

## 2016-06-13 DIAGNOSIS — M545 Low back pain: Secondary | ICD-10-CM | POA: Diagnosis present

## 2016-06-13 DIAGNOSIS — E1142 Type 2 diabetes mellitus with diabetic polyneuropathy: Secondary | ICD-10-CM | POA: Diagnosis present

## 2016-06-13 DIAGNOSIS — E11319 Type 2 diabetes mellitus with unspecified diabetic retinopathy without macular edema: Secondary | ICD-10-CM | POA: Diagnosis present

## 2016-06-13 DIAGNOSIS — Z716 Tobacco abuse counseling: Secondary | ICD-10-CM

## 2016-06-13 DIAGNOSIS — G8929 Other chronic pain: Secondary | ICD-10-CM | POA: Diagnosis present

## 2016-06-13 DIAGNOSIS — Z9114 Patient's other noncompliance with medication regimen: Secondary | ICD-10-CM

## 2016-06-13 DIAGNOSIS — D631 Anemia in chronic kidney disease: Secondary | ICD-10-CM | POA: Diagnosis present

## 2016-06-13 LAB — COMPREHENSIVE METABOLIC PANEL
ALK PHOS: 104 U/L (ref 38–126)
ALT: 10 U/L — AB (ref 17–63)
AST: 16 U/L (ref 15–41)
Albumin: 3.2 g/dL — ABNORMAL LOW (ref 3.5–5.0)
Anion gap: 10 (ref 5–15)
BUN: 14 mg/dL (ref 6–20)
CALCIUM: 8 mg/dL — AB (ref 8.9–10.3)
CO2: 29 mmol/L (ref 22–32)
Chloride: 101 mmol/L (ref 101–111)
Creatinine, Ser: 3.88 mg/dL — ABNORMAL HIGH (ref 0.61–1.24)
GFR calc Af Amer: 17 mL/min — ABNORMAL LOW (ref >60)
GFR, EST NON AFRICAN AMERICAN: 15 mL/min — AB (ref >60)
Glucose, Bld: 124 mg/dL — ABNORMAL HIGH (ref 65–99)
Potassium: 3.5 mmol/L (ref 3.5–5.1)
Sodium: 140 mmol/L (ref 135–145)
Total Bilirubin: 0.7 mg/dL (ref 0.3–1.2)
Total Protein: 6.5 g/dL (ref 6.5–8.1)

## 2016-06-13 LAB — DIFFERENTIAL
Basophils Absolute: 0 10*3/uL (ref 0.0–0.1)
Basophils Relative: 1 %
Eosinophils Absolute: 0.2 10*3/uL (ref 0.0–0.7)
Eosinophils Relative: 3 %
LYMPHS PCT: 9 %
Lymphs Abs: 0.6 10*3/uL — ABNORMAL LOW (ref 0.7–4.0)
MONO ABS: 0.5 10*3/uL (ref 0.1–1.0)
MONOS PCT: 7 %
NEUTROS ABS: 5.2 10*3/uL (ref 1.7–7.7)
Neutrophils Relative %: 81 %

## 2016-06-13 LAB — SAMPLE TO BLOOD BANK

## 2016-06-13 LAB — CBC
HEMATOCRIT: 21.2 % — AB (ref 39.0–52.0)
Hemoglobin: 6.8 g/dL — CL (ref 13.0–17.0)
MCH: 30.2 pg (ref 26.0–34.0)
MCHC: 32.1 g/dL (ref 30.0–36.0)
MCV: 94.2 fL (ref 78.0–100.0)
Platelets: 231 10*3/uL (ref 150–400)
RBC: 2.25 MIL/uL — ABNORMAL LOW (ref 4.22–5.81)
RDW: 15.6 % — AB (ref 11.5–15.5)
WBC: 6.5 10*3/uL (ref 4.0–10.5)

## 2016-06-13 LAB — I-STAT CHEM 8, ED
BUN: 19 mg/dL (ref 6–20)
CALCIUM ION: 1.01 mmol/L — AB (ref 1.15–1.40)
CREATININE: 3.7 mg/dL — AB (ref 0.61–1.24)
Chloride: 100 mmol/L — ABNORMAL LOW (ref 101–111)
Glucose, Bld: 124 mg/dL — ABNORMAL HIGH (ref 65–99)
HCT: 23 % — ABNORMAL LOW (ref 39.0–52.0)
Hemoglobin: 7.8 g/dL — ABNORMAL LOW (ref 13.0–17.0)
Potassium: 3.8 mmol/L (ref 3.5–5.1)
Sodium: 142 mmol/L (ref 135–145)
TCO2: 32 mmol/L (ref 0–100)

## 2016-06-13 LAB — PROTIME-INR
INR: 1.06
PROTHROMBIN TIME: 13.8 s (ref 11.4–15.2)

## 2016-06-13 LAB — PREPARE RBC (CROSSMATCH)

## 2016-06-13 LAB — GLUCOSE, CAPILLARY: Glucose-Capillary: 200 mg/dL — ABNORMAL HIGH (ref 65–99)

## 2016-06-13 LAB — I-STAT TROPONIN, ED: TROPONIN I, POC: 0.09 ng/mL — AB (ref 0.00–0.08)

## 2016-06-13 LAB — CBG MONITORING, ED: Glucose-Capillary: 113 mg/dL — ABNORMAL HIGH (ref 65–99)

## 2016-06-13 LAB — APTT: APTT: 51 s — AB (ref 24–36)

## 2016-06-13 MED ORDER — MORPHINE SULFATE (PF) 4 MG/ML IV SOLN: 4.0000 mg | Freq: Once | INTRAVENOUS | Status: DC

## 2016-06-13 MED ORDER — ACETAMINOPHEN 160 MG/5ML PO SOLN: 650.0000 mg | ORAL | Status: DC | PRN

## 2016-06-13 MED ORDER — ENOXAPARIN SODIUM 30 MG/0.3ML ~~LOC~~ SOLN
30.0000 mg | SUBCUTANEOUS | Status: DC
  Administered 2016-06-13: 30 mg via SUBCUTANEOUS
  Filled 2016-06-13 (×2): qty 0.3

## 2016-06-13 MED ORDER — HYDROMORPHONE HCL 1 MG/ML IJ SOLN
1.0000 mg | Freq: Once | INTRAMUSCULAR | Status: AC
  Administered 2016-06-13: 1 mg via INTRAMUSCULAR
  Filled 2016-06-13: qty 1

## 2016-06-13 MED ORDER — HYDROCODONE-ACETAMINOPHEN 5-325 MG PO TABS: 1.0000 | ORAL_TABLET | Freq: Four times a day (QID) | ORAL | 0 refills | Status: DC | PRN

## 2016-06-13 MED ORDER — CARVEDILOL 3.125 MG PO TABS
3.1250 mg | ORAL_TABLET | Freq: Two times a day (BID) | ORAL | Status: DC
  Administered 2016-06-13 – 2016-06-15 (×4): 3.125 mg via ORAL
  Filled 2016-06-13 (×4): qty 1

## 2016-06-13 MED ORDER — ONDANSETRON 8 MG PO TBDP
8.0000 mg | ORAL_TABLET | Freq: Once | ORAL | Status: AC
  Administered 2016-06-13: 8 mg via ORAL
  Filled 2016-06-13: qty 1

## 2016-06-13 MED ORDER — CLOPIDOGREL BISULFATE 75 MG PO TABS
75.0000 mg | ORAL_TABLET | Freq: Every day | ORAL | Status: DC
  Administered 2016-06-13 – 2016-06-14 (×2): 75 mg via ORAL
  Filled 2016-06-13 (×2): qty 1

## 2016-06-13 MED ORDER — ONDANSETRON HCL 4 MG/2ML IJ SOLN
4.0000 mg | Freq: Once | INTRAMUSCULAR | Status: AC
  Administered 2016-06-13: 4 mg via INTRAVENOUS
  Filled 2016-06-13: qty 2

## 2016-06-13 MED ORDER — SODIUM CHLORIDE 0.9 % IV SOLN
Freq: Once | INTRAVENOUS | Status: AC
  Administered 2016-06-13: 17:00:00 via INTRAVENOUS

## 2016-06-13 MED ORDER — ROSUVASTATIN CALCIUM 40 MG PO TABS
40.0000 mg | ORAL_TABLET | Freq: Every day | ORAL | Status: DC
  Administered 2016-06-13 – 2016-06-15 (×3): 40 mg via ORAL
  Filled 2016-06-13 (×2): qty 2
  Filled 2016-06-13 (×2): qty 1
  Filled 2016-06-13: qty 2
  Filled 2016-06-13: qty 1

## 2016-06-13 MED ORDER — ACETAMINOPHEN 325 MG PO TABS
650.0000 mg | ORAL_TABLET | ORAL | Status: DC | PRN
  Filled 2016-06-13: qty 2

## 2016-06-13 MED ORDER — ACETAMINOPHEN 650 MG RE SUPP: 650.0000 mg | RECTAL | Status: DC | PRN

## 2016-06-13 MED ORDER — ONDANSETRON HCL 4 MG/2ML IJ SOLN: 4.0000 mg | Freq: Four times a day (QID) | INTRAMUSCULAR | Status: DC | PRN

## 2016-06-13 MED ORDER — ONDANSETRON HCL 4 MG/2ML IJ SOLN: 4.0000 mg | Freq: Once | INTRAMUSCULAR | Status: DC

## 2016-06-13 MED ORDER — SENNOSIDES-DOCUSATE SODIUM 8.6-50 MG PO TABS: 1.0000 | ORAL_TABLET | Freq: Every evening | ORAL | Status: DC | PRN

## 2016-06-13 MED ORDER — STROKE: EARLY STAGES OF RECOVERY BOOK
Freq: Once | Status: AC
  Administered 2016-06-13: 1
  Filled 2016-06-13: qty 1

## 2016-06-13 NOTE — ED Notes (Signed)
Pfeifer, MD aware of pts complaint of R inguinal hernia pain

## 2016-06-13 NOTE — ED Triage Notes (Signed)
Pt in via [facility] from HD tx facility, pt rcvd HD treatment with the exception of the last hour, labs were drawn there & reported hgb 4, initial call for R sided weakness unknown duration, per EMS pt did not have R sided weakness upon their arrival, pt A&O x4, denies pain, denies bleeding, no slurred speech or facial droop noted

## 2016-06-13 NOTE — ED Notes (Signed)
ED Provider at bedside. 

## 2016-06-13 NOTE — ED Notes (Signed)
IV team unable to gain 2nd IV site

## 2016-06-13 NOTE — ED Notes (Signed)
Pt returned from MRI and connected to the monitor 

## 2016-06-13 NOTE — ED Notes (Signed)
Pt provided with meal tray.

## 2016-06-13 NOTE — ED Notes (Signed)
Admitting paged for a repeat H/H but admitting provider stated to wait until 5am labs

## 2016-06-13 NOTE — Consult Note (Signed)
Neurology Consultation Reason for Consult: Right leg weakness Referring Physician: Theodis Hill  CC: Right leg weakness  History is obtained from: Patient  HPI: [name] is a 70 y.o. male he reports that his right leg has not been working very well for the past 2 weeks. He also describes that it feels slightly numb, but I'm not sure if he means numb or weak because he describes numbness as "it is not working right."  Initially a code stroke was called because he is a very poor historian and when asked when he was last normal, he initially reports 6 AM. I then ask him when things started and he again reports 6 AM. I then asked when it was last completely normal, and he reported 2 weeks ago. He has been having progressive difficulty with walking since then.  He was seen at dialysis were hemoglobin was measured and was found to before. 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   [name]: At least 2 weeks ago tpa given?: no, out of window   ROS: A 14 point ROS was performed and is negative except as noted in the HPI.   Past Medical History:  Diagnosis Date  . 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 Anemia, iron deficiency 03/24/2011   a. Recurrent GI bleed, tx with periodic iron infusions.  . 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 AVM (arteriovenous malformation) of duodenum, acquired    egds in 01/2012, 04/2011  . BPH (benign prostatic hyperplasia)   . CAD (coronary artery disease)    a. Cath 09/2012: moderate borderline CAD in mid LAD/small diagonal branch, mild RCA stenosis, to be managed medically   . Chronic lower back pain   . Diabetic peripheral neuropathy ([place]) 2014   foot pain.  . Fatty liver    on CT of 11/2010  . GERD (gastroesophageal reflux disease)   . GI bleed    a. Recurrent GI bleed, tx with IV iron. b. Per heme notes - likely AVMs 04/2012 (tx with cauterization several months ago).  . Hematuria     a. Urology note scan from 07/2012: cystoscopy without evidence for bladder lesion, only lateral hypertrophy of posterior urethra, bladder impression from BPH. b. Pt states he had "some tests" scheduled for later in July 2014.  . High cholesterol   . Hypertension   . 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 Orthostatic hypotension    a. Tx with florinef.  . Peripheral neuropathy ([place])   . Polyp, colonic    Colonoscopy 01/2012 "benign" polyp  . Prostate cancer ([place])   . Sleep apnea    "had mask; couldn't sleep in it" (09/20/2012)  . Smoker   . Stage III chronic kidney disease    a. Stage 3 (DM with complications ->CKD, peripheral neuropathy).  . Stroke ([place])    x 2  . Type II diabetes mellitus ([place])    a. Dx 1994, uncontrolled.      Family History  Problem Relation Age of Onset  . Stroke Father     Died at 54  . Diabetes Mother   . Heart attack Brother     Died at 40  . Diabetes Sister   . Stomach cancer Brother   . Heart attack Sister      Social History:  reports that he has been smoking Cigarettes.  He started smoking about 48 years ago. He has a 43.00 pack-year  smoking history. He has never used smokeless tobacco. He reports that he does not drink alcohol or use drugs.   Exam: Current vital signs: BP (!) 168/92   Pulse 95   Temp 97.7 F (36.5 C) (Oral)   Resp (!) 23   Ht 5\' 7"  (1.702 m)   Wt 65.8 kg (145 lb)   SpO2 98%   BMI 22.71 kg/m  Vital signs in last 24 hours: Temp:  [97.7 F (36.5 C)-98.7 F (37.1 C)] 97.7 F (36.5 C) (03/30 1154) Pulse Rate:  [94-107] 95 (03/30 1215) Resp:  [19-23] 23 (03/30 1215) BP: (144-168)/(74-92) 168/92 (03/30 1215) SpO2:  [97 %-100 %] 98 % (03/30 1215) Weight:  [65.8 kg (145 lb)] 65.8 kg (145 lb) (03/30 1154)   Physical Exam  Constitutional: Appears well-developed and well-nourished.  Psych: Affect appropriate to situation Eyes: No scleral injection HENT: No OP obstrucion Head: Normocephalic.   Cardiovascular: Normal rate and regular rhythm.  Respiratory: Effort normal and breath sounds normal to anterior ascultation GI: Soft.  No distension. There is no tenderness.  Skin: WDI  Neuro: Mental Status: Patient is awake, alert, oriented to person, place,  year, and situation. He gives the month as April Patient is able to give a clear and coherent history. No signs of aphasia or neglect Cranial Nerves: II: Visual Fields are full. Pupils are equal, round, and reactive to light.   III,IV, VI: EOMI without ptosis or diploplia.  V: Facial sensation is symmetric to temperature VII: Facial movement is symmetric.  VIII: hearing is intact to voice X: Uvula elevates symmetrically XI: Shoulder shrug is symmetric. XII: tongue is midline without atrophy or fasciculations.  Motor: Tone is normal. Bulk is normal. 5/5 strength was present in all four extremities.  Sensory: When testing sensation, he gives inconsistent responses in the left leg, but consistent intact to pinprick in the right leg.? L4 radicular distribution numbness in the left but this is not consistent. Cerebellar: He has tremor on finger-nose-finger bilaterally, but no clear ataxia   I have reviewed labs in epic and the results pertinent to this consultation are: Hemoglobin 7.8  I have reviewed the images obtained: CT head-no acute findings  Impression: 72 year old male with a history of right leg weakness and worsening symptoms over the past 2 weeks. I suspect that this is a recrudescence of his previous stroke symptoms in the setting of low hemoglobin, and I would favor getting an MRI prior to doing further workup. MRI is negative, then I would treat his underlying medical issues, if positive repeat stroke workup would be indicated.  Recommendations: 1) MRI brain 2) stroke workup if positive, neurology will sign off if negative.   [name], MD [facility] [phone]  If 7pm- 7am, please  page neurology on call as listed in [place].

## 2016-06-13 NOTE — ED Notes (Signed)
Pt in MRI.

## 2016-06-13 NOTE — ED Notes (Signed)
MRI called to notify RN and Dr Colvin Caroli that pt is unable to lie flat due to coughing and "drooling" MRI to attempt to get pt to lie flat to do MRI scan

## 2016-06-13 NOTE — ED Notes (Signed)
Pt returned from Xray and connected to the monitor. Will go to MRI when blood transfusion is complete

## 2016-06-13 NOTE — ED Provider Notes (Signed)
[facility] DEPT Provider Note   CSN: [id number] Arrival date & time: 06/13/16  1144   An emergency department physician performed an initial assessment on this suspected stroke patient at 1216.  History   Chief Complaint Chief Complaint  Patient presents with  . Weakness  . Abnormal Lab  . Code Stroke    HPI [name] is a 71 y.o. male.  HPI Patient had been seen early in the morning for right lower extremity pain and dysfunction. His exam and evaluation at that time suggested musculoskeletal etiology. Patient was discharged and subsequently went to dialysis. He made it through about 2 hours of dialysis and then reportedly had speech deficit and was not saying his name correctly. He was referred to the emergency department for suspected stroke symptoms. Reportedly EMS noted intact strength of the right lower extremity upon arrival but then during transport noted right lower extremity weakness. At the time of my evaluation the patient did not have symmetric elevation of the right lower extremity relative to the left. He was very challenging historian identifying onset of symptoms and associated symptoms. At one point, he reported it as being acutely different but in later history also identifies ongoing problems with that extremity. Patient's family members with him and thinks that he does not seem like himself. He seems more weak and not as cognitively intact as his baseline. Past Medical History:  Diagnosis Date  . 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 Anemia, iron deficiency 03/24/2011   a. Recurrent GI bleed, tx with periodic iron infusions.  . 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 AVM (arteriovenous malformation) of duodenum, acquired    egds in 01/2012, 04/2011  . BPH (benign prostatic hyperplasia)   . CAD (coronary artery disease)    a. Cath 09/2012: moderate borderline CAD in mid LAD/small diagonal branch, mild RCA stenosis, to be managed medically   . Chronic  lower back pain   . Diabetic peripheral neuropathy ([place]) 2014   foot pain.  . Fatty liver    on CT of 11/2010  . GERD (gastroesophageal reflux disease)   . GI bleed    a. Recurrent GI bleed, tx with IV iron. b. Per heme notes - likely AVMs 04/2012 (tx with cauterization several months ago).  . Hematuria    a. Urology note scan from 07/2012: cystoscopy without evidence for bladder lesion, only lateral hypertrophy of posterior urethra, bladder impression from BPH. b. Pt states he had "some tests" scheduled for later in July 2014.  . High cholesterol   . Hypertension   . 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 Orthostatic hypotension    a. Tx with florinef.  . Peripheral neuropathy ([place])   . Polyp, colonic    Colonoscopy 01/2012 "benign" polyp  . Prostate cancer ([facility])   . Sleep apnea    "had mask; couldn't sleep in it" (09/20/2012)  . Smoker   . Stage III chronic kidney disease    a. Stage 3 (DM with complications ->CKD, peripheral neuropathy).  . Stroke ([place])    x 2  . Type II diabetes mellitus ([place])    a. Dx 1994, uncontrolled.     Patient Active Problem List   Diagnosis Date Noted  . Diabetic retinopathy ([place]) 01/16/2016  . Dysphagia 12/21/2015  . Environmental allergies 12/04/2015  . Allergic rhinitis 10/28/2015  . ESRD on dialysis ([place]) 10/28/2015  . 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 Malnutrition of moderate degree 04/05/2015  . Acute renal failure  superimposed on stage 4 chronic kidney disease ([place]) 04/04/2015  . Benign paroxysmal positional vertigo 09/26/2014  . Right leg weakness 02/28/2014  . Metatarsalgia of both feet 09/13/2013  . Equinus deformity of foot, acquired 09/13/2013  . Diabetes mellitus type 2, controlled, with complications ([place]) 16/12/9602  . GERD (gastroesophageal reflux disease) 03/24/2013  . GI bleed 02/28/2013  . Cerebral infarction ([place]) 11/17/2012  . Dizziness and giddiness 10/31/2012  . Prostate cancer ([place])  10/27/2012  . Diabetic neuropathy ([place]) 10/04/2012  . CAD (coronary artery disease) 10/04/2012  . Hoarseness 06/26/2012  . Hematuria 05/24/2012  . Tinea pedis 05/24/2012  . Back pain 02/23/2012  . Erectile dysfunction 02/03/2012  . Type 2 diabetes, uncontrolled, with neuropathy ([place]) 02/01/2012  . Essential hypertension 02/01/2012  . Tobacco use 02/01/2012  . Hyperlipidemia 02/01/2012  . Anemia of renal disease 05/14/2011    Past Surgical History:  Procedure Laterality Date  . ENTEROSCOPY N/A 03/01/2013   Procedure: ENTEROSCOPY;  Surgeon: Inda Castle, MD;  Location: [facility];  Service: Endoscopy;  Laterality: N/A;  . GIVENS CAPSULE STUDY N/A 04/05/2015   Procedure: GIVENS CAPSULE STUDY;  Surgeon: Gatha Mayer, MD;  Location: [facility];  Service: Endoscopy;  Laterality: N/A;  . INGUINAL HERNIA REPAIR Right 2011  . LEFT HEART CATHETERIZATION WITH CORONARY ANGIOGRAM N/A 09/21/2012   Procedure: LEFT HEART CATHETERIZATION WITH CORONARY ANGIOGRAM;  Surgeon: Peter M Martinique, MD;  Location: [facility] CATH LAB;  Service: Cardiovascular;  Laterality: N/A;  . LUMBAR DISC SURGERY  1980's  . LYMPHADENECTOMY Bilateral 01/19/2013   Procedure: LYMPHADENECTOMY;  Surgeon: Bernestine Amass, MD;  Location: [facility];  Service: Urology;  Laterality: Bilateral;  . ROBOT ASSISTED LAPAROSCOPIC RADICAL PROSTATECTOMY N/A 01/19/2013   Procedure: ROBOTIC ASSISTED LAPAROSCOPIC RADICAL PROSTATECTOMY;  Surgeon: Bernestine Amass, MD;  Location: [facility];  Service: Urology;  Laterality: N/A;  . SHOULDER OPEN ROTATOR CUFF REPAIR Left 1980's       Home Medications    Prior to Admission medications   Medication Sig Start Date End Date Taking? Authorizing Provider  benzonatate (TESSALON) 100 MG capsule Take 1 capsule (100 mg total) by mouth 3 (three) times daily as needed for cough. Patient not taking: Reported on 06/13/2016 04/30/16   Mackie Pai, PA-C  carvedilol (COREG) 3.125 MG tablet TAKE 1 TABLET BY MOUTH TWICE A  DAY WITH A MEAL 10/16/15   Historical Provider, MD  clopidogrel (PLAVIX) 75 MG tablet Take 1 tablet (75 mg total) by mouth daily. Patient not taking: Reported on 06/13/2016 04/01/16   Inda Coke, PA  doxycycline (VIBRA-TABS) 100 MG tablet Take 1 tablet (100 mg total) by mouth 2 (two) times daily. Can give caps or generic Patient not taking: Reported on 06/13/2016 04/30/16   Mackie Pai, PA-C  HYDROcodone-acetaminophen (NORCO/VICODIN) 5-325 MG tablet Take 1 tablet by mouth every 6 (six) hours as needed. Patient not taking: Reported on 06/13/2016 06/13/16   Orpah Greek, MD  loratadine (CLARITIN) 10 MG tablet Take 1 tablet (10 mg total) by mouth every other day. Patient not taking: Reported on 06/13/2016 10/25/15   [name], NP  nitroGLYCERIN (NITROSTAT) 0.4 MG SL tablet Place 1 tablet (0.4 mg total) under the tongue every 5 (five) minutes as needed for chest pain (up to 3 doses). Patient not taking: Reported on 06/13/2016 10/08/15   [name], NP  Nutritional Supplements (FEEDING SUPPLEMENT, NEPRO CARB STEADY,) LIQD Take 237 mLs by mouth 3 (three) times daily between meals. Patient not taking: Reported on 06/13/2016 10/12/15  [name], NP  rosuvastatin (CRESTOR) 40 MG tablet Take 1 tablet (40 mg total) by mouth daily. Patient not taking: Reported on 06/13/2016 04/04/16   Inda Coke, PA    Family History Family History  Problem Relation Age of Onset  . Stroke Father     Died at 63  . Diabetes Mother   . Heart attack Brother     Died at 30  . Diabetes Sister   . Stomach cancer Brother   . Heart attack Sister     Social History Social History  Substance Use Topics  . Smoking status: Current Some Day Smoker    Packs/day: 1.00    Years: 43.00    Types: Cigarettes    Start date: 04/15/1968  . Smokeless tobacco: Never Used     Comment: 07-22--2016  still smoking  . Alcohol use No     Comment: 09/20/2012 "Used to; stopped ~ 2009; never had problem  w/it"     Allergies   Patient has no known allergies.   Review of Systems Review of Systems 10 Systems reviewed and are negative for acute change except as noted in the HPI.   Physical Exam Updated Vital Signs BP (!) 160/80   Pulse 90   Temp 98 F (36.7 C) (Oral)   Resp (!) 21   Ht 5\' 7"  (1.702 m)   Wt 147 lb 4.3 oz (66.8 kg)   SpO2 100%   BMI 23.07 kg/m   Physical Exam  Constitutional:  Patient is a thin gentleman but well nourished appearing. Nontoxic and alert. No respiratory distress.  HENT:  Head: Normocephalic and atraumatic.  Mouth/Throat: Oropharynx is clear and moist.  Eyes: EOM are normal. Pupils are equal, round, and reactive to light.  Cardiovascular: Normal rate, regular rhythm, normal heart sounds and intact distal pulses.   Pulmonary/Chest: Effort normal.  Fine basilar rail right lung base.  Abdominal: Soft. Bowel sounds are normal. He exhibits no distension. There is no tenderness. There is no guarding.  Genitourinary:  Genitourinary Comments: Rectal examination soft brown stool in the vault no melena.  Musculoskeletal:  No peripheral edema calves are soft and nontender.  Neurological:  Patient is alert and interactive. He is oriented 3. On initial examination, difficulty elevating right lower extremity off of the bed against gravity. Left lower extremity elevated without difficulty and could hold against resistance. Grip strength is symmetric bilaterally upper extremities. CN 2 through 12 intact.  Skin: Skin is warm and dry.  Psychiatric: He has a normal mood and affect.     ED Treatments / Results  Labs (all labs ordered are listed, but only abnormal results are displayed) Labs Reviewed  APTT - Abnormal; Notable for the following:       Result Value   aPTT 51 (*)    All other components within normal limits  CBC - Abnormal; Notable for the following:    RBC 2.25 (*)    Hemoglobin 6.8 (*)    HCT 21.2 (*)    RDW 15.6 (*)    All other  components within normal limits  DIFFERENTIAL - Abnormal; Notable for the following:    Lymphs Abs 0.6 (*)    All other components within normal limits  COMPREHENSIVE METABOLIC PANEL - Abnormal; Notable for the following:    Glucose, Bld 124 (*)    Creatinine, Ser 3.88 (*)    Calcium 8.0 (*)    Albumin 3.2 (*)    ALT 10 (*)    GFR  calc non Af Amer 15 (*)    GFR calc Af Amer 17 (*)    All other components within normal limits  I-STAT TROPOININ, ED - Abnormal; Notable for the following:    Troponin i, poc 0.09 (*)    All other components within normal limits  CBG MONITORING, ED - Abnormal; Notable for the following:    Glucose-Capillary 113 (*)    All other components within normal limits  I-STAT CHEM 8, ED - Abnormal; Notable for the following:    Chloride 100 (*)    Creatinine, Ser 3.70 (*)    Glucose, Bld 124 (*)    Calcium, Ion 1.01 (*)    Hemoglobin 7.8 (*)    HCT 23.0 (*)    All other components within normal limits  PROTIME-INR  SAMPLE TO BLOOD BANK  TYPE AND SCREEN  PREPARE RBC (CROSSMATCH)    EKG  EKG Interpretation None       Radiology Ct Head Wo Contrast  Result Date: 06/13/2016 CLINICAL DATA:  Right leg weakness this morning. EXAM: CT HEAD WITHOUT CONTRAST TECHNIQUE: Contiguous axial images were obtained from the base of the skull through the vertex without intravenous contrast. COMPARISON:  Head CT 11/27/2015 FINDINGS: Brain: Chronic lacunar infarcts in the basal basal ganglia and periventricular white matter, stable from prior exam. Generalized atrophy and chronic small vessel ischemia. No intracranial hemorrhage, mass effect, or midline shift. No hydrocephalus. The basilar cisterns are patent. No evidence of territorial infarct. No intracranial fluid collection. Vascular: Atherosclerosis of skullbase vasculature without hyperdense vessel or abnormal calcification. Skull: Normal. Negative for fracture or focal lesion. Sinuses/Orbits: Paranasal sinuses and  mastoid air cells are clear. The visualized orbits are unremarkable. Other: None. IMPRESSION: 1.  No acute intracranial abnormality. 2. Stable atrophy, chronic and remote ischemia. Electronically Signed   By: Jeb Levering M.D.   On: 06/13/2016 06:45   [name] Contrast (neuro Protocol)  Result Date: 06/13/2016 CLINICAL DATA:  Right leg weakness.  Groin pain. EXAM: MRI HEAD WITHOUT CONTRAST TECHNIQUE: Multiplanar, multiecho pulse sequences of the brain and surrounding structures were obtained without intravenous contrast. COMPARISON:  Head CT 06/13/2016 and MRI 04/04/2015 FINDINGS: Brain: There are 2 subcentimeter foci of acute infarction involving the lateral margin of the right thalamus/adjacent deep white matter. There is moderate cerebral atrophy. Patchy to confluent T2 hyperintensities in the subcortical and deep cerebral white matter and pons have mildly progressed from the prior MRI and are compatible with moderate chronic small vessel ischemic disease. Chronic lacunar infarcts are present in the basal ganglia, thalami, and deep cerebral white matter bilaterally. Numerous foci of chronic microhemorrhage are again seen in the deep gray nuclei and bilateral cerebral and cerebellar hemispheres, mildly increased in number from the prior MRI and likely reflecting chronic hypertension. No mass, midline shift, or extra-axial fluid collection is seen. Vascular: Major intracranial vascular flow voids are preserved. Skull and upper cervical spine: Heterogeneously diminished bone marrow signal intensity in the skull and visualized cervical spine likely reflecting chronic renal failure and anemia. Sinuses/Orbits: Unremarkable orbits. Paranasal sinuses and mastoid air cells are clear. Other: None. IMPRESSION: 1. Punctate acute infarcts along the lateral margin of the right thalamus. 2. Moderate chronic small vessel ischemic disease. Electronically Signed   By: Logan Bores M.D.   On: 06/13/2016 16:57   Dg Hip  Unilat W Or Wo Pelvis 2-3 Views Right  Result Date: 06/13/2016 CLINICAL DATA:  Right leg weakness and pain, no known injury, initial encounter EXAM: DG  HIP (WITH OR WITHOUT PELVIS) 3VRIGHT COMPARISON:  None. FINDINGS: Pelvic ring is intact. No acute fracture or dislocation is noted. No gross soft tissue abnormality is seen. IMPRESSION: No acute abnormality noted. Electronically Signed   By: Inez Catalina M.D.   On: 06/13/2016 07:11   Ct Head Code Stroke W/o Cm  Result Date: 06/13/2016 CLINICAL DATA:  Code stroke. Right-sided weakness. Hypertension, diabetes, dialysis EXAM: CT HEAD WITHOUT CONTRAST TECHNIQUE: Contiguous axial images were obtained from the base of the skull through the vertex without intravenous contrast. COMPARISON:  CT head 06/13/2016 FINDINGS: Brain: Negative for acute infarct.  Negative for hemorrhage or mass Mild atrophy. Chronic microvascular ischemic change in the white matter. Chronic infarcts in the basal ganglia bilaterally including the left internal capsule anteriorly. Bilateral putamen infarcts. Vascular: No hyperdense vessel or unexpected calcification. Skull: Negative Sinuses/Orbits: Negative Other: None ASPECTS ([place] Stroke Program Early CT Score) - Ganglionic level infarction (caudate, lentiform nuclei, internal capsule, insula, M1-M3 cortex): 7 - Supraganglionic infarction (M4-M6 cortex): 3 Total score (0-10 with 10 being normal): 10 IMPRESSION: 1. No acute intracranial abnormality 2. ASPECTS is 10 3. Moderate chronic ischemic changes in the white matter and basal ganglia bilaterally. These results were called by telephone at the time of interpretation on 06/13/2016 at 12:36 pm to Dr. [name], who verbally acknowledged these results. Electronically Signed   By: Franchot Gallo M.D.   On: 06/13/2016 12:36    Procedures Procedures (including critical care time)  Medications Ordered in ED Medications  ondansetron (ZOFRAN) injection 4 mg (4 mg Intravenous Given 06/13/16  1252)  0.9 %  sodium chloride infusion ( Intravenous New Bag/Given 06/13/16 1635)     Initial Impression / Assessment and Plan / ED Course  I have reviewed the triage vital signs and the nursing notes.  Pertinent labs & imaging results that were available during my care of the patient were reviewed by me and considered in my medical decision making (see chart for details).     Consult: [facility] hospitalist for admission  Final Clinical Impressions(s) / ED Diagnoses   Final diagnoses:  Symptomatic anemia  Weakness of right lower extremity  Severe comorbid illness    New Prescriptions New Prescriptions   No medications on file     Charlesetta Shanks, MD 06/13/16 1720

## 2016-06-13 NOTE — ED Notes (Signed)
Pt taken to MRI  

## 2016-06-13 NOTE — ED Triage Notes (Signed)
Pt presents to ED with complaints of right leg weakness and pain in groin area when trying to move right leg. Pt states this has been going on for a couple weeks but is worse today.

## 2016-06-13 NOTE — ED Notes (Signed)
This RN attempted IV x1 unsuccessful, was unable to visualize any other site. Pt is restricted on left r/t fistula.

## 2016-06-13 NOTE — ED Provider Notes (Signed)
[facility]PT [facility] Provider Note   CSN: [id number] Arrival date & time: 06/13/16  0546     History   Chief Complaint Chief Complaint  Patient presents with  . Extremity Weakness    HPI LELAN CUSH is a 71 y.o. male.  Patient reports that he was getting right to go to dialysis this morning but was having trouble walking. He is having trouble with his right leg. He reports that for the last couple of weeks he has been experiencing pain in the right groin area. This morning when he woke up the pain was severe and every time he tries but weight on his leg he cannot because it worsens the pain. There is no numbness, tingling, paresthesias. He does not have any problems with his upper extremities. No facial droop or trouble speaking.      Past Medical History:  Diagnosis Date  . 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 Anemia, iron deficiency 03/24/2011   a. Recurrent GI bleed, tx with periodic iron infusions.  . 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 AVM (arteriovenous malformation) of duodenum, acquired    egds in 01/2012, 04/2011  . BPH (benign prostatic hyperplasia)   . CAD (coronary artery disease)    a. Cath 09/2012: moderate borderline CAD in mid LAD/small diagonal branch, mild RCA stenosis, to be managed medically   . Chronic lower back pain   . Diabetic peripheral neuropathy ([place]) 2014   foot pain.  . Fatty liver    on CT of 11/2010  . GERD (gastroesophageal reflux disease)   . GI bleed    a. Recurrent GI bleed, tx with IV iron. b. Per heme notes - likely AVMs 04/2012 (tx with cauterization several months ago).  . Hematuria    a. Urology note scan from 07/2012: cystoscopy without evidence for bladder lesion, only lateral hypertrophy of posterior urethra, bladder impression from BPH. b. Pt states he had "some tests" scheduled for later in July 2014.  . High cholesterol   . Hypertension   . 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 Orthostatic  hypotension    a. Tx with florinef.  . Peripheral neuropathy ([place])   . Polyp, colonic    Colonoscopy 01/2012 "benign" polyp  . Prostate cancer ([place])   . Sleep apnea    "had mask; couldn't sleep in it" (09/20/2012)  . Smoker   . Stage III chronic kidney disease    a. Stage 3 (DM with complications ->CKD, peripheral neuropathy).  . Stroke ([place])    x 2  . Type II diabetes mellitus ([place])    a. Dx 1994, uncontrolled.     Patient Active Problem List   Diagnosis Date Noted  . Diabetic retinopathy ([place]) 01/16/2016  . Dysphagia 12/21/2015  . Environmental allergies 12/04/2015  . Allergic rhinitis 10/28/2015  . ESRD on dialysis ([place]) 10/28/2015  . 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 Malnutrition of moderate degree 04/05/2015  . Acute renal failure superimposed on stage 4 chronic kidney disease ([place]) 04/04/2015  . Benign paroxysmal positional vertigo 09/26/2014  . Right leg weakness 02/28/2014  . Metatarsalgia of both feet 09/13/2013  . Equinus deformity of foot, acquired 09/13/2013  . Diabetes mellitus type 2, controlled, with complications ([place]) 09/73/5329  . GERD (gastroesophageal reflux disease) 03/24/2013  . GI bleed 02/28/2013  . Cerebral infarction ([place]) 11/17/2012  . Dizziness and giddiness 10/31/2012  . Prostate cancer ([place]) 10/27/2012  . Diabetic neuropathy ([place]) 10/04/2012  . CAD (coronary artery disease)  10/04/2012  . Hoarseness 06/26/2012  . Hematuria 05/24/2012  . Tinea pedis 05/24/2012  . Back pain 02/23/2012  . Erectile dysfunction 02/03/2012  . Type 2 diabetes, uncontrolled, with neuropathy ([place]) 02/01/2012  . Essential hypertension 02/01/2012  . Tobacco use 02/01/2012  . Hyperlipidemia 02/01/2012  . Anemia of renal disease 05/14/2011    Past Surgical History:  Procedure Laterality Date  . ENTEROSCOPY N/A 03/01/2013   Procedure: ENTEROSCOPY;  Surgeon: Inda Castle, MD;  Location: [facility];  Service: Endoscopy;  Laterality: N/A;  . GIVENS  CAPSULE STUDY N/A 04/05/2015   Procedure: GIVENS CAPSULE STUDY;  Surgeon: Gatha Mayer, MD;  Location: [facility];  Service: Endoscopy;  Laterality: N/A;  . INGUINAL HERNIA REPAIR Right 2011  . LEFT HEART CATHETERIZATION WITH CORONARY ANGIOGRAM N/A 09/21/2012   Procedure: LEFT HEART CATHETERIZATION WITH CORONARY ANGIOGRAM;  Surgeon: Peter M Martinique, MD;  Location: [facility] CATH LAB;  Service: Cardiovascular;  Laterality: N/A;  . LUMBAR DISC SURGERY  1980's  . LYMPHADENECTOMY Bilateral 01/19/2013   Procedure: LYMPHADENECTOMY;  Surgeon: Bernestine Amass, MD;  Location: [facility];  Service: Urology;  Laterality: Bilateral;  . ROBOT ASSISTED LAPAROSCOPIC RADICAL PROSTATECTOMY N/A 01/19/2013   Procedure: ROBOTIC ASSISTED LAPAROSCOPIC RADICAL PROSTATECTOMY;  Surgeon: Bernestine Amass, MD;  Location: [facility];  Service: Urology;  Laterality: N/A;  . SHOULDER OPEN ROTATOR CUFF REPAIR Left 1980's       Home Medications    Prior to Admission medications   Medication Sig Start Date End Date Taking? Authorizing Provider  benzonatate (TESSALON) 100 MG capsule Take 1 capsule (100 mg total) by mouth 3 (three) times daily as needed for cough. 04/30/16   Percell Miller Saguier, PA-C  carvedilol (COREG) 3.125 MG tablet TAKE 1 TABLET BY MOUTH TWICE A DAY WITH A MEAL 10/16/15   Historical Provider, MD  clopidogrel (PLAVIX) 75 MG tablet Take 1 tablet (75 mg total) by mouth daily. 04/01/16   Inda Coke, PA  doxycycline (VIBRA-TABS) 100 MG tablet Take 1 tablet (100 mg total) by mouth 2 (two) times daily. Can give caps or generic 04/30/16   Mackie Pai, PA-C  loratadine (CLARITIN) 10 MG tablet Take 1 tablet (10 mg total) by mouth every other day. 10/25/15   [name], NP  nitroGLYCERIN (NITROSTAT) 0.4 MG SL tablet Place 1 tablet (0.4 mg total) under the tongue every 5 (five) minutes as needed for chest pain (up to 3 doses). 10/08/15   [name], NP  Nutritional Supplements (FEEDING SUPPLEMENT, NEPRO CARB STEADY,) LIQD  Take 237 mLs by mouth 3 (three) times daily between meals. 10/12/15   [name], NP  rosuvastatin (CRESTOR) 40 MG tablet Take 1 tablet (40 mg total) by mouth daily. 04/04/16   Inda Coke, PA    Family History Family History  Problem Relation Age of Onset  . Stroke Father     Died at 87  . Diabetes Mother   . Heart attack Brother     Died at 84  . Diabetes Sister   . Stomach cancer Brother   . Heart attack Sister     Social History Social History  Substance Use Topics  . Smoking status: Current Some Day Smoker    Packs/day: 1.00    Years: 43.00    Types: Cigarettes    Start date: 04/15/1968  . Smokeless tobacco: Never Used     Comment: 07-22--2016  still smoking  . Alcohol use No     Comment: 09/20/2012 "Used to; stopped ~ 2009; never  had problem w/it"     Allergies   Patient has no known allergies.   Review of Systems Review of Systems  Musculoskeletal: Positive for gait problem.  Neurological: Negative for numbness.  All other systems reviewed and are negative.    Physical Exam Updated Vital Signs BP (!) 144/79 (BP Location: Right Arm)   Pulse (!) 107   Temp 98.7 F (37.1 C) (Oral)   Resp 20   SpO2 97%   Physical Exam  Constitutional: He is oriented to person, place, and time. He appears well-developed and well-nourished. No distress.  HENT:  Head: Normocephalic and atraumatic.  Right Ear: Hearing normal.  Left Ear: Hearing normal.  Nose: Nose normal.  Mouth/Throat: Oropharynx is clear and moist and mucous membranes are normal.  Eyes: Conjunctivae and EOM are normal. Pupils are equal, round, and reactive to light.  Neck: Normal range of motion. Neck supple.  Cardiovascular: Regular rhythm, S1 normal and S2 normal.  Exam reveals no gallop and no friction rub.   No murmur heard. Pulmonary/Chest: Effort normal and breath sounds normal. No respiratory distress. He exhibits no tenderness.  Abdominal: Soft. Normal appearance and bowel sounds are  normal. There is no hepatosplenomegaly. There is no tenderness. There is no rebound, no guarding, no tenderness at McBurney's point and negative Murphy's sign. No hernia.  Musculoskeletal:       Right hip: He exhibits decreased range of motion (painful inhibition) and tenderness.       Legs: Patient has normal sensation and strength of both lower extremities. He has decreased ability to raise his leg secondary to painful inhibition.  Neurological: He is alert and oriented to person, place, and time. He has normal strength. No cranial nerve deficit or sensory deficit. Coordination normal. GCS eye subscore is 4. GCS verbal subscore is 5. GCS motor subscore is 6.  Extraocular muscle movement: normal No visual field cut Pupils: equal and reactive both direct and consensual response is normal No nystagmus present    Sensory function is intact to light touch, pinprick Proprioception intact  Grip strength 5/5 symmetric in upper extremities No pronator drift Normal finger to nose bilaterally  Lower extremity strength 5/5 against gravity (he is able to initiate movement with the right leg without difficulty and has strength to a point, however, after raising to approximately 30 he starts having pain in the groin which inhibits any further motion) Normal heel to shin bilaterally  Gait: antalgic right leg   Skin: Skin is warm, dry and intact. No rash noted. No cyanosis.  Psychiatric: He has a normal mood and affect. His speech is normal and behavior is normal. Thought content normal.  Nursing note and vitals reviewed.    ED Treatments / Results  Labs (all labs ordered are listed, but only abnormal results are displayed) Labs Reviewed  CBC WITH DIFFERENTIAL/PLATELET  BASIC METABOLIC PANEL    EKG  EKG Interpretation None       Radiology Ct Head Wo Contrast  Result Date: 06/13/2016 CLINICAL DATA:  Right leg weakness this morning. EXAM: CT HEAD WITHOUT CONTRAST TECHNIQUE: Contiguous  axial images were obtained from the base of the skull through the vertex without intravenous contrast. COMPARISON:  Head CT 11/27/2015 FINDINGS: Brain: Chronic lacunar infarcts in the basal basal ganglia and periventricular white matter, stable from prior exam. Generalized atrophy and chronic small vessel ischemia. No intracranial hemorrhage, mass effect, or midline shift. No hydrocephalus. The basilar cisterns are patent. No evidence of territorial infarct. No intracranial fluid  collection. Vascular: Atherosclerosis of skullbase vasculature without hyperdense vessel or abnormal calcification. Skull: Normal. Negative for fracture or focal lesion. Sinuses/Orbits: Paranasal sinuses and mastoid air cells are clear. The visualized orbits are unremarkable. Other: None. IMPRESSION: 1.  No acute intracranial abnormality. 2. Stable atrophy, chronic and remote ischemia. Electronically Signed   By: Jeb Levering M.D.   On: 06/13/2016 06:45    Procedures Procedures (including critical care time)  Medications Ordered in ED Medications  HYDROmorphone (DILAUDID) injection 1 mg (1 mg Intramuscular Given 06/13/16 0706)  ondansetron (ZOFRAN-ODT) disintegrating tablet 8 mg (8 mg Oral Given 06/13/16 0705)     Initial Impression / Assessment and Plan / ED Course  I have reviewed the triage vital signs and the nursing notes.  Pertinent labs & imaging results that were available during my care of the patient were reviewed by me and considered in my medical decision making (see chart for details).     Patient presents to the ER for evaluation of pain in his right leg and difficulty walking. He reports he has been noticing pain in the right groin area for the last 2 weeks. Pain worsens when he flexes at the hip. This morning, however, he noticed increased pain and was having difficulty walking because he had trouble raising his leg off the ground because the hip area hurts when he does this. He has not fallen or had  any injury to the area. There is no lumbar back pain or history of chronic back issues. He is not febrile. There is absolutely no symptomatology of the right arm. He has not perceived any weakness, numbness and his examination is very symmetric with normal strength in both upper extremities. CT head was performed although it is felt that this was not secondary to stroke or CNS cause. CT head was unremarkable. X-ray of hip performed, no acute abnormality noted.  Patient was examined multiple times. He does not have a perceivable decreased strength of the leg, do not suspect that there is a lumbar lesion to explain the patient's symptoms. Patient consistently complains of pain in the anterior hip and inguinal area, in the area of the hip flexors that only occurs when he flexes at the hip. This is consistent with acute muscle strain and musculoskeletal pain causing antalgic gait and was treated with analgesia.   Patient was administered IM Dilaudid. After the analgesia was given, his pain has resolved. He is now able to raise his leg without difficulty in the bed, has no further complaints of pain or difficulty with movement of the leg.  He needs to have his dialysis today and schedule follow-up with his primary doctor.  Final Clinical Impressions(s) / ED Diagnoses   Final diagnoses:  None    New Prescriptions New Prescriptions   No medications on file     Orpah Greek, MD 06/13/16 [id number]

## 2016-06-13 NOTE — ED Notes (Signed)
Pt was unable to complete MRI scan due to "drooling" and pt vomiting. Dr Colvin Caroli aware

## 2016-06-13 NOTE — H&P (Signed)
History and Physical    ZAVON HYSON JKD:[id number] DOB: March 10, 1946 DOA: 06/13/2016  PCP: [name], NP  Patient coming from: dialysis unit  Chief Complaint:right leg numbess and speech abnormalities in dialysis unit  HPI: [name] is a 71 y.o. male with medical history significant of hypertension, coronary artery disease status post cardiac cath in 2014 on medical management, ESRD on hemodialysis Monday Wednesday Friday schedule, anemia of chronic kidney disease, history of GI bleed, history of prior stroke presented from hemodialysis unit after worsening right leg numbness associated with slurred speech and hemodialysis unit today. Patient reported that he has on and off numbness of his right lower extremities for about a year but this morning the numbness was worse. At some time patient also complaining of right lower extremity pain which later denied. Patient is poor historian. He initially went to [facility] and then he went to hemodialysis unit today. During hemodialysis unit, he did not feel well associated with difficulty speaking and noted to have slurred. Patient was brought to the hospital.He should also episode of nausea and vomiting. He denied fever, chills, headache, dizziness, chest pain, shortness of breath, cough. Patient was accompanied by his wife in ER. Patient received only 2-1/2 hours of hemodialysis today. His usually receives 3 and half hours of hemodialysis treatment.  ED Course: in the ER patient was found to have anemia with hemoglobin of 7.8, CT scan of head and right hip x-ray with no acute finding. Initially his stroke code was called. Evaluated by neurologist recommended MRI of the brain. During this note writing, MRI result his back which is consistent with a small punctate acute infarction. He was also getting a unit of red blood cell transfusion in the ER. Admitted for further evaluation.  Review of Systems: As per HPI otherwise 10 point  review of systems negative.    Past Medical History:  Diagnosis Date  . 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 Anemia, iron deficiency 03/24/2011   a. Recurrent GI bleed, tx with periodic iron infusions.  . 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 AVM (arteriovenous malformation) of duodenum, acquired    egds in 01/2012, 04/2011  . BPH (benign prostatic hyperplasia)   . CAD (coronary artery disease)    a. Cath 09/2012: moderate borderline CAD in mid LAD/small diagonal branch, mild RCA stenosis, to be managed medically   . Chronic lower back pain   . Diabetic peripheral neuropathy ([place]) 2014   foot pain.  . Fatty liver    on CT of 11/2010  . GERD (gastroesophageal reflux disease)   . GI bleed    a. Recurrent GI bleed, tx with IV iron. b. Per heme notes - likely AVMs 04/2012 (tx with cauterization several months ago).  . Hematuria    a. Urology note scan from 07/2012: cystoscopy without evidence for bladder lesion, only lateral hypertrophy of posterior urethra, bladder impression from BPH. b. Pt states he had "some tests" scheduled for later in July 2014.  . High cholesterol   . Hypertension   . 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 Orthostatic hypotension    a. Tx with florinef.  . Peripheral neuropathy ([place])   . Polyp, colonic    Colonoscopy 01/2012 "benign" polyp  . Prostate cancer ([place])   . Sleep apnea    "had mask; couldn't sleep in it" (09/20/2012)  . Smoker   . Stage III chronic kidney disease    a. Stage 3 (DM with  complications ->CKD, peripheral neuropathy).  . Stroke ([place])    x 2  . Type II diabetes mellitus ([place])    a. Dx 1994, uncontrolled.     Past Surgical History:  Procedure Laterality Date  . ENTEROSCOPY N/A 03/01/2013   Procedure: ENTEROSCOPY;  Surgeon: Inda Castle, MD;  Location: [place];  Service: Endoscopy;  Laterality: N/A;  . GIVENS CAPSULE STUDY N/A 04/05/2015   Procedure: GIVENS CAPSULE STUDY;  Surgeon: Gatha Mayer, MD;   Location: [place];  Service: Endoscopy;  Laterality: N/A;  . INGUINAL HERNIA REPAIR Right 2011  . LEFT HEART CATHETERIZATION WITH CORONARY ANGIOGRAM N/A 09/21/2012   Procedure: LEFT HEART CATHETERIZATION WITH CORONARY ANGIOGRAM;  Surgeon: Peter M Martinique, MD;  Location: [facility] CATH LAB;  Service: Cardiovascular;  Laterality: N/A;  . LUMBAR DISC SURGERY  1980's  . LYMPHADENECTOMY Bilateral 01/19/2013   Procedure: LYMPHADENECTOMY;  Surgeon: Bernestine Amass, MD;  Location: [facility];  Service: Urology;  Laterality: Bilateral;  . ROBOT ASSISTED LAPAROSCOPIC RADICAL PROSTATECTOMY N/A 01/19/2013   Procedure: ROBOTIC ASSISTED LAPAROSCOPIC RADICAL PROSTATECTOMY;  Surgeon: Bernestine Amass, MD;  Location: [facility];  Service: Urology;  Laterality: N/A;  . SHOULDER OPEN ROTATOR CUFF REPAIR Left 1980's    Social history: reports that he has been smoking Cigarettes.  He started smoking about 48 years ago. He has a 43.00 pack-year smoking history. He has never used smokeless tobacco. He reports that he does not drink alcohol or use drugs.  No Known Allergiesallergies reviewed, no known drug allergies reported  Family History  Problem Relation Age of Onset  . Stroke Father     Died at 78  . Diabetes Mother   . Heart attack Brother     Died at 35  . Diabetes Sister   . Stomach cancer Brother   . Heart attack Sister      Prior to Admission medications   Medication Sig Start Date End Date Taking? Authorizing Provider  benzonatate (TESSALON) 100 MG capsule Take 1 capsule (100 mg total) by mouth 3 (three) times daily as needed for cough. Patient not taking: Reported on 06/13/2016 04/30/16   Mackie Pai, PA-C  carvedilol (COREG) 3.125 MG tablet TAKE 1 TABLET BY MOUTH TWICE A DAY WITH A MEAL 10/16/15   Historical Provider, MD  clopidogrel (PLAVIX) 75 MG tablet Take 1 tablet (75 mg total) by mouth daily. Patient not taking: Reported on 06/13/2016 04/01/16   Inda Coke, PA  doxycycline (VIBRA-TABS) 100 MG tablet  Take 1 tablet (100 mg total) by mouth 2 (two) times daily. Can give caps or generic Patient not taking: Reported on 06/13/2016 04/30/16   Mackie Pai, PA-C  HYDROcodone-acetaminophen (NORCO/VICODIN) 5-325 MG tablet Take 1 tablet by mouth every 6 (six) hours as needed. Patient not taking: Reported on 06/13/2016 06/13/16   Orpah Greek, MD  loratadine (CLARITIN) 10 MG tablet Take 1 tablet (10 mg total) by mouth every other day. Patient not taking: Reported on 06/13/2016 10/25/15   [name], NP  nitroGLYCERIN (NITROSTAT) 0.4 MG SL tablet Place 1 tablet (0.4 mg total) under the tongue every 5 (five) minutes as needed for chest pain (up to 3 doses). Patient not taking: Reported on 06/13/2016 10/08/15   [name], NP  Nutritional Supplements (FEEDING SUPPLEMENT, NEPRO CARB STEADY,) LIQD Take 237 mLs by mouth 3 (three) times daily between meals. Patient not taking: Reported on 06/13/2016 10/12/15   [name], NP  rosuvastatin (CRESTOR) 40 MG tablet Take 1 tablet (40 mg  total) by mouth daily. Patient not taking: Reported on 06/13/2016 04/04/16   Inda Coke, [place]    Physical Exam: Vitals:   06/13/16 1633 06/13/16 1645 06/13/16 1700 06/13/16 1703  BP:  (!) 170/82 (!) 160/80 (!) 160/80  Pulse: 89 87 86 90  Resp: 20 17 (!) 24 (!) 21  Temp:    98 F (36.7 C)  TempSrc:    Oral  SpO2: 99% 95% 97% 100%  Weight:      Height:          Constitutional: NAD, calm, comfortable Vitals:   06/13/16 1633 06/13/16 1645 06/13/16 1700 06/13/16 1703  BP:  (!) 170/82 (!) 160/80 (!) 160/80  Pulse: 89 87 86 90  Resp: 20 17 (!) 24 (!) 21  Temp:    98 F (36.7 C)  TempSrc:    Oral  SpO2: 99% 95% 97% 100%  Weight:      Height:       Eyes: PERRL, lids and conjunctivae normal ENMT: Mucous membranes are moist.   Neck: normal, supple Respiratory: clear to auscultation bilaterally, no wheezing, no crackles. Normal respiratory effort. No accessory muscle use.  Cardiovascular:  Regular rate and rhythm, no murmurs / rubs / gallops. No extremity edema. 2+ pedal pulses.  Abdomen: no tenderness. Bowel sounds positive. soft. Musculoskeletal: right lower extremity is slightly weaker than left. Other muscle strength 5 over 5. No lower extremity edema.  Skin: no rashes, lesions, ulcers. No induration Neurologic: CN 2-12 grossly intact. Sensation intact Psychiatric: Normal judgment and insight. Alert and oriented x 3. Normal mood.    Labs on Admission: I have personally reviewed following labs and imaging studies  CBC:  Recent Labs Lab 06/13/16 1201 06/13/16 1222  WBC 6.5  --   NEUTROABS 5.2  --   HGB 6.8* 7.8*  HCT 21.2* 23.0*  MCV 94.2  --   PLT 231  --    Basic Metabolic Panel:  Recent Labs Lab 06/13/16 1201 06/13/16 1222  NA 140 142  K 3.5 3.8  CL 101 100*  CO2 29  --   GLUCOSE 124* 124*  BUN 14 19  CREATININE 3.88* 3.70*  CALCIUM 8.0*  --    GFR: Estimated Creatinine Clearance: 17.6 mL/min (A) (by C-G formula based on SCr of 3.7 mg/dL (H)). Liver Function Tests:  Recent Labs Lab 06/13/16 1201  AST 16  ALT 10*  ALKPHOS 104  BILITOT 0.7  PROT 6.5  ALBUMIN 3.2*   No results for input(s): LIPASE, AMYLASE in the last 168 hours. No results for input(s): AMMONIA in the last 168 hours. Coagulation Profile:  Recent Labs Lab 06/13/16 1201  INR 1.06   Cardiac Enzymes: No results for input(s): CKTOTAL, CKMB, CKMBINDEX, TROPONINI in the last 168 hours. BNP (last 3 results) No results for input(s): PROBNP in the last 8760 hours. HbA1C: No results for input(s): HGBA1C in the last 72 hours. CBG:  Recent Labs Lab 06/13/16 1203  GLUCAP 113*   Lipid Profile: No results for input(s): CHOL, HDL, LDLCALC, TRIG, CHOLHDL, LDLDIRECT in the last 72 hours. Thyroid Function Tests: No results for input(s): TSH, T4TOTAL, FREET4, T3FREE, THYROIDAB in the last 72 hours. Anemia Panel: No results for input(s): VITAMINB12, FOLATE, FERRITIN, TIBC,  IRON, RETICCTPCT in the last 72 hours. Urine analysis:    Component Value Date/Time   COLORURINE YELLOW 04/01/2016 0939   APPEARANCEUR CLEAR 04/01/2016 0939   LABSPEC >=1.030 (A) 04/01/2016 0939   PHURINE 5.5 04/01/2016 0939   GLUCOSEU NEGATIVE  04/01/2016 0939   HGBUR MODERATE (A) 04/01/2016 0939   BILIRUBINUR SMALL (A) 04/01/2016 0939   BILIRUBINUR small 06/21/2012 1041   KETONESUR TRACE (A) 04/01/2016 0939   PROTEINUR >300 (A) 12/25/2015 1028   UROBILINOGEN 0.2 04/01/2016 0939   NITRITE NEGATIVE 04/01/2016 0939   LEUKOCYTESUR NEGATIVE 04/01/2016 0939   Sepsis Labs: !!!!!!!!!!!!!!!!!!!!!!!!!!!!!!!!!!!!!!!!!!!! @LABRCNTIP (procalcitonin:4,lacticidven:4) )No results found for this or any previous visit (from the past 240 hour(s)).   Radiological Exams on Admission: Ct Head Wo Contrast  Result Date: 06/13/2016 CLINICAL DATA:  Right leg weakness this morning. EXAM: CT HEAD WITHOUT CONTRAST TECHNIQUE: Contiguous axial images were obtained from the base of the skull through the vertex without intravenous contrast. COMPARISON:  Head CT 11/27/2015 FINDINGS: Brain: Chronic lacunar infarcts in the basal basal ganglia and periventricular white matter, stable from prior exam. Generalized atrophy and chronic small vessel ischemia. No intracranial hemorrhage, mass effect, or midline shift. No hydrocephalus. The basilar cisterns are patent. No evidence of territorial infarct. No intracranial fluid collection. Vascular: Atherosclerosis of skullbase vasculature without hyperdense vessel or abnormal calcification. Skull: Normal. Negative for fracture or focal lesion. Sinuses/Orbits: Paranasal sinuses and mastoid air cells are clear. The visualized orbits are unremarkable. Other: None. IMPRESSION: 1.  No acute intracranial abnormality. 2. Stable atrophy, chronic and remote ischemia. Electronically Signed   By: Jeb Levering M.D.   On: 06/13/2016 06:45   [name] Contrast (neuro Protocol)  Result  Date: 06/13/2016 CLINICAL DATA:  Right leg weakness.  Groin pain. EXAM: MRI HEAD WITHOUT CONTRAST TECHNIQUE: Multiplanar, multiecho pulse sequences of the brain and surrounding structures were obtained without intravenous contrast. COMPARISON:  Head CT 06/13/2016 and MRI 04/04/2015 FINDINGS: Brain: There are 2 subcentimeter foci of acute infarction involving the lateral margin of the right thalamus/adjacent deep white matter. There is moderate cerebral atrophy. Patchy to confluent T2 hyperintensities in the subcortical and deep cerebral white matter and pons have mildly progressed from the prior MRI and are compatible with moderate chronic small vessel ischemic disease. Chronic lacunar infarcts are present in the basal ganglia, thalami, and deep cerebral white matter bilaterally. Numerous foci of chronic microhemorrhage are again seen in the deep gray nuclei and bilateral cerebral and cerebellar hemispheres, mildly increased in number from the prior MRI and likely reflecting chronic hypertension. No mass, midline shift, or extra-axial fluid collection is seen. Vascular: Major intracranial vascular flow voids are preserved. Skull and upper cervical spine: Heterogeneously diminished bone marrow signal intensity in the skull and visualized cervical spine likely reflecting chronic renal failure and anemia. Sinuses/Orbits: Unremarkable orbits. Paranasal sinuses and mastoid air cells are clear. Other: None. IMPRESSION: 1. Punctate acute infarcts along the lateral margin of the right thalamus. 2. Moderate chronic small vessel ischemic disease. Electronically Signed   By: Logan Bores M.D.   On: 06/13/2016 16:57   Dg Hip Unilat W Or Wo Pelvis 2-3 Views Right  Result Date: 06/13/2016 CLINICAL DATA:  Right leg weakness and pain, no known injury, initial encounter EXAM: DG HIP (WITH OR WITHOUT PELVIS) 3VRIGHT COMPARISON:  None. FINDINGS: Pelvic ring is intact. No acute fracture or dislocation is noted. No gross soft tissue  abnormality is seen. IMPRESSION: No acute abnormality noted. Electronically Signed   By: Inez Catalina M.D.   On: 06/13/2016 07:11   Ct Head Code Stroke W/o Cm  Result Date: 06/13/2016 CLINICAL DATA:  Code stroke. Right-sided weakness. Hypertension, diabetes, dialysis EXAM: CT HEAD WITHOUT CONTRAST TECHNIQUE: Contiguous axial images were obtained from the base of the  skull through the vertex without intravenous contrast. COMPARISON:  CT head 06/13/2016 FINDINGS: Brain: Negative for acute infarct.  Negative for hemorrhage or mass Mild atrophy. Chronic microvascular ischemic change in the white matter. Chronic infarcts in the basal ganglia bilaterally including the left internal capsule anteriorly. Bilateral putamen infarcts. Vascular: No hyperdense vessel or unexpected calcification. Skull: Negative Sinuses/Orbits: Negative Other: None ASPECTS ([place] Stroke Program Early CT Score) - Ganglionic level infarction (caudate, lentiform nuclei, internal capsule, insula, M1-M3 cortex): 7 - Supraganglionic infarction (M4-M6 cortex): 3 Total score (0-10 with 10 being normal): 10 IMPRESSION: 1. No acute intracranial abnormality 2. ASPECTS is 10 3. Moderate chronic ischemic changes in the white matter and basal ganglia bilaterally. These results were called by telephone at the time of interpretation on 06/13/2016 at 12:36 pm to Dr. [name], who verbally acknowledged these results. Electronically Signed   By: Franchot Gallo M.D.   On: 06/13/2016 12:36    EKG: Independently reviewed. Sinus rhythm, nonspecific T wave abnormalities.  Assessment/Plan Active Problems:   Stroke [facility])  # Acute punctate ischemic stroke at right thalamus: -patient with right lower extremity numbness, weakness and slurred likely contributed by acute stroke. -apparently patient was not taking Plavix atorvastatin at home. I will resume Plavix and statin and Coreg in the hospital. -Check MRA brain, carotid Doppler ultrasound,  echocardiogram, lipid panel, A1c -frequent neuro check and supportive care. -ER nurse reported that patient passed swallow evaluation therefore restarted diet. -PT, OT evaluation and treatment. -Follow-up with neurologist recommendation  # ESRD on hemodialysis: Patient received incomplete hemodialysis treatment today. His electrolytes are acceptable.Consult requested to Dr. Florene Glen to resume hemodialysis treatment in the hospital.  # essential hypertension: Blood pressure elevated. Maintain permissive hypertension. On low-dose Coreg. Continue to monitor closely.  # anemia of chronic kidney disease: Receiving a unit of red blood cell transfusion in the ER. Monitor CBC. Watch for fluid overload as patient is ESRD. IV iron and ASA treatment during dialysis deferr to nephrologist.patient has right IJ tunneled catheter for hemodialysis treatment.  #Right lower extremity numbness and weakness likely contributed by acute stroke on top of known history of stroke: PT, OT evaluation and supportive care as discussed above.  #mildly elevated troponin level likely demand ischemia in the setting of ESRD. EKG with no ischemic change. Follow-up echocardiogram.  DVT prophylaxis: lovenox sq Code Status: full Family Communication:wife at bedside in ER Disposition Plan: likely dc home in 2 days Consults called:Neurology and Nephrology Admission status:inpatient because of acute stroke   Dron Tanna Furry MD [facility] Pager [phone]  If 7PM-7AM, please contact night-coverage [email or web address] Password [facility]  06/13/2016, 5:28 PM

## 2016-06-13 NOTE — ED Notes (Signed)
Dinner tray ordered; heart healthy diet 

## 2016-06-13 NOTE — ED Notes (Signed)
activated code stroke per Dr. Wilhemena Durie.

## 2016-06-14 ENCOUNTER — Encounter (HOSPITAL_COMMUNITY): Payer: Medicare Other

## 2016-06-14 ENCOUNTER — Other Ambulatory Visit (HOSPITAL_COMMUNITY): Payer: Medicare Other

## 2016-06-14 DIAGNOSIS — D649 Anemia, unspecified: Secondary | ICD-10-CM

## 2016-06-14 LAB — BASIC METABOLIC PANEL
Anion gap: 11 (ref 5–15)
BUN: 26 mg/dL — ABNORMAL HIGH (ref 6–20)
CALCIUM: 7.8 mg/dL — AB (ref 8.9–10.3)
CHLORIDE: 102 mmol/L (ref 101–111)
CO2: 28 mmol/L (ref 22–32)
CREATININE: 5.55 mg/dL — AB (ref 0.61–1.24)
GFR calc Af Amer: 11 mL/min — ABNORMAL LOW (ref >60)
GFR calc non Af Amer: 9 mL/min — ABNORMAL LOW (ref >60)
GLUCOSE: 101 mg/dL — AB (ref 65–99)
Potassium: 3.8 mmol/L (ref 3.5–5.1)
Sodium: 141 mmol/L (ref 135–145)

## 2016-06-14 LAB — TYPE AND SCREEN
ABO/RH(D): AB POS
ANTIBODY SCREEN: NEGATIVE
UNIT DIVISION: 0

## 2016-06-14 LAB — LIPID PANEL
CHOLESTEROL: 131 mg/dL (ref 0–200)
HDL: 31 mg/dL — AB (ref >40)
LDL CALC: 69 mg/dL (ref 0–99)
TRIGLYCERIDES: 156 mg/dL — AB (ref <150)
Total CHOL/HDL Ratio: 4.2 RATIO
VLDL: 31 mg/dL (ref 0–40)

## 2016-06-14 LAB — CBC
HEMATOCRIT: 21.8 % — AB (ref 39.0–52.0)
Hemoglobin: 7.2 g/dL — ABNORMAL LOW (ref 13.0–17.0)
MCH: 30.3 pg (ref 26.0–34.0)
MCHC: 33 g/dL (ref 30.0–36.0)
MCV: 91.6 fL (ref 78.0–100.0)
Platelets: 226 10*3/uL (ref 150–400)
RBC: 2.38 MIL/uL — ABNORMAL LOW (ref 4.22–5.81)
RDW: 17.5 % — ABNORMAL HIGH (ref 11.5–15.5)
WBC: 8.3 10*3/uL (ref 4.0–10.5)

## 2016-06-14 LAB — BPAM RBC
BLOOD PRODUCT EXPIRATION DATE: 201803302359
ISSUE DATE / TIME: 201803301654
Unit Type and Rh: 8400

## 2016-06-14 LAB — GLUCOSE, CAPILLARY: Glucose-Capillary: 86 mg/dL (ref 65–99)

## 2016-06-14 LAB — MRSA PCR SCREENING: MRSA by PCR: NEGATIVE

## 2016-06-14 NOTE — Evaluation (Signed)
Physical Therapy Evaluation Patient Details Name: THADDUS MCDOWELL MRN: [id number] DOB: 07/26/1945 Today's Date: 06/14/2016   History of Present Illness  This 71 y.o. male admitted with new onset Rt LE weakness and slurred speach.  MRI of brain showed punctate acute infarcts along the lateral margin of the Rt thalamus.   PMH includes:  ESRD with HD, h/o CVA, DM, OSA, orthostatic hypotension, HTN   Clinical Impression  Patient presents with problems listed below.  Will benefit from acute PT to maximize functional mobility prior to d/c.  Patient with RLE weakness, decreased balance, and decreased cognition, all impacting mobility/gait/safety.  Recommend HHPT at d/c for continued therapy.   Wife reports she has arranged for transportation to/from HD for next week.    Follow Up Recommendations Home health PT;Supervision for mobility/OOB    Equipment Recommendations  Cane    Recommendations for Other Services       Precautions / Restrictions Precautions Precautions: Fall Restrictions Weight Bearing Restrictions: No      Mobility  Bed Mobility Overal bed mobility: Modified Independent             General bed mobility comments: Patient in chair as PT entered room  Transfers Overall transfer level: Needs assistance Equipment used: Rolling walker (2 wheeled) Transfers: Sit to/from Stand Sit to Stand: Min guard Stand pivot transfers: Min guard       General transfer comment: Verbal cues for hand placement.  Assist for safety/balance.  Ambulation/Gait Ambulation/Gait assistance: Min guard Ambulation Distance (Feet): 120 Feet (60' with RW; 65' with no assistive device) Assistive device: Rolling walker (2 wheeled);None Gait Pattern/deviations: Step-through pattern;Decreased stride length;Drifts right/left Gait velocity: decreased Gait velocity interpretation: Below normal speed for age/gender General Gait Details: Patient with slow, slightly unsteady gait.  Ambulated with RW and  no assistive device.  Patient walked close to Rt wall, running into objects on hallway with RW.   Stairs            Wheelchair Mobility    Modified Rankin (Stroke Patients Only)       Balance Overall balance assessment: Needs assistance Sitting-balance support: No upper extremity supported;Feet supported Sitting balance-Leahy Scale: Good     Standing balance support: No upper extremity supported Standing balance-Leahy Scale: Fair                               Pertinent Vitals/Pain Pain Assessment: No/denies pain    Home Living Family/patient expects to be discharged to:: Private residence Living Arrangements: Spouse/significant other Available Help at Discharge: Family;Available PRN/intermittently (Wife works during day) Type of Home: House Home Access: Stairs to enter   Technical brewer of Steps: 1 Home Layout: One level Home Equipment: Kasandra Knudsen - single point Additional Comments: wife works during the day     Prior Function Level of Independence: Independent with assistive device(s)   Gait / Transfers Assistance Needed: Pt occasionally ambulated with cane   ADL's / Homemaking Assistance Needed: wife performs IADLs - pt was independent with ADLs.    Comments: Patient drives himself to HD     Hand Dominance   Dominant Hand: Right    Extremity/Trunk Assessment   Upper Extremity Assessment Upper Extremity Assessment: Defer to OT evaluation    Lower Extremity Assessment Lower Extremity Assessment: RLE deficits/detail RLE Deficits / Details: Strength 4/5, slightly weaker than LLE    Cervical / Trunk Assessment Cervical / Trunk Assessment: Normal  Communication   Communication:  No difficulties  Cognition Arousal/Alertness: Awake/alert Behavior During Therapy: WFL for tasks assessed/performed;Flat affect Overall Cognitive Status: Impaired/Different from baseline Area of Impairment: Attention;Memory;Problem solving                    Current Attention Level: Selective Memory: Decreased short-term memory       Problem Solving: Slow processing General Comments: Wife reports progressive memory changes that have worsened over the past couple of weeks and increased irritabiilty and moodiness       General Comments General comments (skin integrity, edema, etc.): discussed therapeutic horseback riding programs with pt and wife     Exercises     Assessment/Plan    PT Assessment Patient needs continued PT services  PT Problem List Decreased strength;Decreased balance;Decreased mobility;Decreased cognition;Decreased knowledge of use of DME       PT Treatment Interventions DME instruction;Gait training;Functional mobility training;Therapeutic activities;Therapeutic exercise;Balance training;Neuromuscular re-education;Cognitive remediation;Patient/family education    PT Goals (Current goals can be found in the Care Plan section)  Acute Rehab PT Goals Patient Stated Goal: to get better  PT Goal Formulation: With patient/family Time For Goal Achievement: 06/21/16 Potential to Achieve Goals: Good    Frequency Min 4X/week   Barriers to discharge Decreased caregiver support Patient is alone while wife is at work.    Co-evaluation               End of Session Equipment Utilized During Treatment: Gait belt Activity Tolerance: Patient tolerated treatment well Patient left: in chair;with call [place]/phone within reach;with chair alarm set;with family/visitor present Nurse Communication: Mobility status PT Visit Diagnosis: Unsteadiness on feet (R26.81);Other abnormalities of gait and mobility (R26.89)    Time: 5686-1683 PT Time Calculation (min) (ACUTE ONLY): 15 min   Charges:   PT Evaluation $PT Eval Moderate Complexity: 1 Procedure     PT G Codes:         Despina Pole 07-10-16, 8:32 PM Carita Pian. Sanjuana Kava, [place] Pager [phone]

## 2016-06-14 NOTE — Progress Notes (Signed)
STROKE TEAM PROGRESS NOTE   HISTORY OF PRESENT ILLNESS (per record)  [name] is a 71 y.o. male he reports that his right leg has not been working very well for the past 2 weeks. He also describes that it feels slightly numb, but I'm not sure if he means numb or weak because he describes numbness as "it is not working right."  Initially a code stroke was called because he is a very poor historian and when asked when he was last normal, he initially reports 6 AM. I then ask him when things started and he again reports 6 AM. I then asked when it was last completely normal, and he reported 2 weeks ago. He has been having progressive difficulty with walking since then.  He was seen at dialysis were hemoglobin was measured and was found to significantly low. 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   [name]: At least 2 weeks ago tpa given?: no, out of window   SUBJECTIVE (INTERVAL HISTORY) His  wife is at the bedside.   He denies any focal neurological symptoms on the left side but states his right leg has been heavy and slightly weak for several days   OBJECTIVE Temp:  [97.7 F (36.5 C)-98.7 F (37.1 C)] 98.7 F (37.1 C) (03/31 1000) Pulse Rate:  [85-108] 99 (03/31 1000) Cardiac Rhythm: Normal sinus rhythm (03/31 0700) Resp:  [9-24] 18 (03/31 1000) BP: (144-180)/(70-95) 148/70 (03/31 1000) SpO2:  [95 %-100 %] 100 % (03/31 1000) Weight:  [62 kg (136 lb 11.2 oz)-66.8 kg (147 lb 4.3 oz)] 62 kg (136 lb 11.2 oz) (03/30 2107)  CBC:   Recent Labs Lab 06/13/16 1201 06/13/16 1222 06/14/16 0358  WBC 6.5  --  8.3  NEUTROABS 5.2  --   --   HGB 6.8* 7.8* 7.2*  HCT 21.2* 23.0* 21.8*  MCV 94.2  --  91.6  PLT 231  --  465    Basic Metabolic Panel:   Recent Labs Lab 06/13/16 1201 06/13/16 1222 06/14/16 0358  NA 140 142 141  K 3.5 3.8 3.8  CL 101 100* 102  CO2 29  --  28  GLUCOSE 124* 124* 101*  BUN 14 19 26*   CREATININE 3.88* 3.70* 5.55*  CALCIUM 8.0*  --  7.8*    Lipid Panel:     Component Value Date/Time   CHOL 131 06/14/2016 0358   TRIG 156 (H) 06/14/2016 0358   HDL 31 (L) 06/14/2016 0358   CHOLHDL 4.2 06/14/2016 0358   VLDL 31 06/14/2016 0358   LDLCALC 69 06/14/2016 0358   HgbA1c:  Lab Results  Component Value Date   HGBA1C 5.3 04/01/2016   Urine Drug Screen: No results found for: LABOPIA, COCAINSCRNUR, LABBENZ, AMPHETMU, THCU, LABBARB    IMAGING  Dg Chest 2 View  06/13/2016 1. No active cardiopulmonary disease.  2. Mild aortic atherosclerosis.     Ct Head Wo Contrast 06/13/2016 1.  No acute intracranial abnormality.  2. Stable atrophy, chronic and remote ischemia.     [name] Contrast (neuro Protocol) 06/13/2016 1. Punctate acute infarcts along the lateral margin of the right thalamus.  2. Moderate chronic small vessel ischemic disease.     Mr Jodene Nam Head/brain Wo Cm 06/13/2016 Mild atherosclerotic changes involving the intracranial circulation as above.  No large vessel occlusion identified.  No high-grade or correctable stenosis.    Dg Hip Unilat W Or  Wo Pelvis 2-3 Views Right 06/13/2016 No acute abnormality noted.    Ct Head Code Stroke W/o Cm 06/13/2016 1. No acute intracranial abnormality  2. ASPECTS is 10  3. Moderate chronic ischemic changes in the white matter and basal ganglia bilaterally.     PHYSICAL EXAM Frail middle-aged African-American male not in distress. His left arm has a dialysis fistula. . Afebrile. Head is nontraumatic. Neck is supple without bruit.    Cardiac exam no murmur or gallop. Lungs are clear to auscultation. Distal pulses are well felt.  Neurological Exam ;  Awake  Alert oriented x 3. Normal speech and language.eye movements full without nystagmus.fundi were not visualized. Vision acuity and fields appear normal. Hearing is normal. Palatal movements are normal. Face symmetric. Tongue midline. Normal strength, tone,  reflexes and coordination except for slight giveaway weakness in the right hip flexors only. Normal sensation. Gait deferred.      ASSESSMENT/PLAN [name] is a 71 y.o. male with history of a previous stroke, diabetes mellitus, end-stage renal disease on dialysis, tobacco use, obstructive sleep apnea, orthostatic hypotension, hypertension, hyperlipidemia, previous GI bleed, coronary artery disease, and anemia presenting with right lower extremity weakness and numbness. He did not receive IV t-PA due to late presentation.  Strokes:  Punctate acute infarcts right thalamus - secondary to small vessel disease. Likely clinically silent and not responsible for his presentation  Resultant  no deficits from the stroke but subjective right leg weakness  MRI - Punctate acute infarcts along the lateral margin of the right thalamus.   MRA - No large vessel occlusion identified. No high-grade or correctable stenosis.  Carotid Doppler - pending  2D Echo -  pending  LDL - 69  HgbA1c - pending  VTE prophylaxis - Lovenox Diet Heart Room service appropriate? Yes; Fluid consistency: Thin  clopidogrel 75 mg daily prior to admission, now on clopidogrel 75 mg daily  Patient counseled to be compliant with his antithrombotic medications  Ongoing aggressive stroke risk factor management  Therapy recommendations:  pending  Disposition: Pending  Hypertension  Stable  Permissive hypertension (OK if < 220/120) but gradually normalize in 5-7 days  Long-term BP goal normotensive  Hyperlipidemia  Home meds:  Coreg 40 mg daily (pt reportedly wasn't taking med) resumed in hospital  LDL 69, goal < 70  Continue statin at discharge  Diabetes  HgbA1c pending, goal < 7.0  Unc / Controlled  Other Stroke Risk Factors  Advanced age  Cigarette smoker - advised to stop smoking  Hx stroke/TIA  Family hx stroke (father)  Coronary artery disease  Obstructive sleep apnea, does not use  CPAP at home  Other Active Problems  Anemia - 7.2 / 21.8  ESRD - dialysis  Hospital day # 1 I have personally examined this patient, reviewed notes, independently viewed imaging studies, participated in medical decision making and plan of care.ROS completed by me personally and pertinent positives fully documented  I have made any additions or clarifications directly to the above note. The patient had stopped taking his aspirin. I counseled the patient to be compliant with his medications as well as aggressive risk factor modification and follow-up with his primary physician. Start aspirin for stroke prevention. Patient also counseled to quit smoking and is willing. He'll need a smoking cessation nicotine patch. Greater than 50% time during this 35 minute visit was spent on counseling and coordination of care about stroke risk, prevention and treatment option discussion and answering questions.  Antony Contras, MD  Medical Director [facility] Pager: [phone] 06/14/2016 4:09 PM   To contact Stroke Continuity provider, please refer to [email or web address] After hours, contact General Neurology

## 2016-06-14 NOTE — Progress Notes (Signed)
PROGRESS NOTE    SCOUT GUMBS  NWG:[id number] DOB: 06/06/1945 DOA: 06/13/2016 PCP: [name], NP   Brief Narrative: 71 y.o. male with medical history significant of hypertension, coronary artery disease status post cardiac cath in 2014 on medical management, ESRD on hemodialysis Monday Wednesday Friday schedule, anemia of chronic kidney disease, history of GI bleed, history of prior stroke presented from hemodialysis unit after worsening right leg numbness associated with slurred speech. ED Course: in the ER patient was found to have anemia with hemoglobin of 7.8, CT scan of head and right hip x-ray with no acute finding. Initially his stroke code was called. Evaluated by neurologist recommended MRI of the brain. MRI consistent with a small punctate acute infarction. Received a unit of red blood cell transfusion in the ER. Admitted for further evaluation  Assessment & Plan:   Active Problems:   Stroke [facility])  # Acute punctate ischemic stroke at right thalamus: -patient with right lower extremity numbness, weakness and slurred likely contributed by acute stroke. -apparently patient was not taking medications att home. -continue Plavix, statin and Coreg. -MRA brain unremarkable -LDL 69 -pending carotid Doppler ultrasound, echocardiogram, A1c -frequent neuro check and supportive care. -PT, OT evaluation and treatment. -Follow-up with stroke team  # ESRD on hemodialysis: Patient received incomplete hemodialysis treatment on 3/30. Consulted nephrologist on admission. Dialysis related management as per nephrologist. Patient has right IJ tunneled catheter for hemodialysis treatment.  # essential hypertension: Monitor blood pressure. On low-dose Coreg.   # anemia of chronic kidney disease: Received a unit of red blood cell transfusion in the ER. Hemoglobin 7.2 today likely due to hemodilution. No sign of bleeding. Repeat CBC in the morning.  IV iron and ESA treatment during  dialysis deferr to nephrologist.  #Right lower extremity numbness and weakness likely contributed by acute stroke on top of known history of stroke: PT, OT evaluation and supportive care as discussed above.  #mildly elevated troponin level likely demand ischemia in the setting of ESRD. EKG with no ischemic change. Follow-up echocardiogram.  DVT prophylaxis: Lovenox subcutaneous Code Status: Full code Family Communication: No family present at bedside Disposition Plan: Likely discharge home with home care in 1-2 days    Consultants:   Neurologist  Nephrologist  Procedures: MRI/MRA Antimicrobials: None  Subjective: Patient was seen and examined at bedside. Reported feeling good. Denied headache, dizziness, nausea, vomiting, chest pain or shortness of breath.  Objective: Vitals:   06/14/16 0100 06/14/16 0248 06/14/16 0500 06/14/16 1000  BP: (!) 151/83 (!) 144/82 (!) 146/87 (!) 148/70  Pulse: 95 100 100 99  Resp: 18 20 18 18   Temp:   98.1 F (36.7 C) 98.7 F (37.1 C)  TempSrc:   Oral Oral  SpO2: 100% 100% 96% 100%  Weight:      Height:        Intake/Output Summary (Last 24 hours) at 06/14/16 1120 Last data filed at 06/13/16 1916  Gross per 24 hour  Intake              670 ml  Output                0 ml  Net              670 ml   Filed Weights   06/13/16 1154 06/13/16 1254 06/13/16 2107  Weight: 65.8 kg (145 lb) 66.8 kg (147 lb 4.3 oz) 62 kg (136 lb 11.2 oz)    Examination:  General exam: Appears calm and  comfortable  Respiratory system: Clear to auscultation. Respiratory effort normal. No wheezing or crackle Cardiovascular system: S1 & S2 heard, RRR.  No pedal edema. Gastrointestinal system: Abdomen is nondistended, soft and nontender. Normal bowel sounds heard. Central nervous system: Alert and oriented.  Extremities: Symmetric 5 x 5 power. Skin: No rashes, lesions or ulcers Psychiatry: Judgement and insight appear normal. Mood & affect appropriate.      Data Reviewed: I have personally reviewed following labs and imaging studies  CBC:  Recent Labs Lab 06/13/16 1201 06/13/16 1222 06/14/16 0358  WBC 6.5  --  8.3  NEUTROABS 5.2  --   --   HGB 6.8* 7.8* 7.2*  HCT 21.2* 23.0* 21.8*  MCV 94.2  --  91.6  PLT 231  --  505   Basic Metabolic Panel:  Recent Labs Lab 06/13/16 1201 06/13/16 1222 06/14/16 0358  NA 140 142 141  K 3.5 3.8 3.8  CL 101 100* 102  CO2 29  --  28  GLUCOSE 124* 124* 101*  BUN 14 19 26*  CREATININE 3.88* 3.70* 5.55*  CALCIUM 8.0*  --  7.8*   GFR: Estimated Creatinine Clearance: 11 mL/min (A) (by C-G formula based on SCr of 5.55 mg/dL (H)). Liver Function Tests:  Recent Labs Lab 06/13/16 1201  AST 16  ALT 10*  ALKPHOS 104  BILITOT 0.7  PROT 6.5  ALBUMIN 3.2*   No results for input(s): LIPASE, AMYLASE in the last 168 hours. No results for input(s): AMMONIA in the last 168 hours. Coagulation Profile:  Recent Labs Lab 06/13/16 1201  INR 1.06   Cardiac Enzymes: No results for input(s): CKTOTAL, CKMB, CKMBINDEX, TROPONINI in the last 168 hours. BNP (last 3 results) No results for input(s): PROBNP in the last 8760 hours. HbA1C: No results for input(s): HGBA1C in the last 72 hours. CBG:  Recent Labs Lab 06/13/16 1203 06/13/16 2155 06/14/16 0703  GLUCAP 113* 200* 86   Lipid Profile:  Recent Labs  06/14/16 0358  CHOL 131  HDL 31*  LDLCALC 69  TRIG 156*  CHOLHDL 4.2   Thyroid Function Tests: No results for input(s): TSH, T4TOTAL, FREET4, T3FREE, THYROIDAB in the last 72 hours. Anemia Panel: No results for input(s): VITAMINB12, FOLATE, FERRITIN, TIBC, IRON, RETICCTPCT in the last 72 hours. Sepsis Labs: No results for input(s): PROCALCITON, LATICACIDVEN in the last 168 hours.  No results found for this or any previous visit (from the past 240 hour(s)).       Radiology Studies: Dg Chest 2 View  Result Date: 06/13/2016 CLINICAL DATA:  Initial evaluation for acute  stroke. EXAM: CHEST  2 VIEW COMPARISON:  Prior radiograph from 04/30/2016. FINDINGS: Dual lumen right-sided central venous catheter in place with tip overlying the cavoatrial junction. Cardiac and mediastinal silhouettes are stable, and remain within normal limits. Mild aortic atherosclerosis. Lungs normally inflated. No focal infiltrate, pulmonary edema, or pleural effusion. No pneumothorax. No acute osseous abnormality. IMPRESSION: 1. No active cardiopulmonary disease. 2. Mild aortic atherosclerosis. Electronically Signed   By: Jeannine Boga M.D.   On: 06/13/2016 18:10   Ct Head Wo Contrast  Result Date: 06/13/2016 CLINICAL DATA:  Right leg weakness this morning. EXAM: CT HEAD WITHOUT CONTRAST TECHNIQUE: Contiguous axial images were obtained from the base of the skull through the vertex without intravenous contrast. COMPARISON:  Head CT 11/27/2015 FINDINGS: Brain: Chronic lacunar infarcts in the basal basal ganglia and periventricular white matter, stable from prior exam. Generalized atrophy and chronic small vessel ischemia. No intracranial hemorrhage,  mass effect, or midline shift. No hydrocephalus. The basilar cisterns are patent. No evidence of territorial infarct. No intracranial fluid collection. Vascular: Atherosclerosis of skullbase vasculature without hyperdense vessel or abnormal calcification. Skull: Normal. Negative for fracture or focal lesion. Sinuses/Orbits: Paranasal sinuses and mastoid air cells are clear. The visualized orbits are unremarkable. Other: None. IMPRESSION: 1.  No acute intracranial abnormality. 2. Stable atrophy, chronic and remote ischemia. Electronically Signed   By: Jeb Levering M.D.   On: 06/13/2016 06:45   [name] Contrast (neuro Protocol)  Result Date: 06/13/2016 CLINICAL DATA:  Right leg weakness.  Groin pain. EXAM: MRI HEAD WITHOUT CONTRAST TECHNIQUE: Multiplanar, multiecho pulse sequences of the brain and surrounding structures were obtained without  intravenous contrast. COMPARISON:  Head CT 06/13/2016 and MRI 04/04/2015 FINDINGS: Brain: There are 2 subcentimeter foci of acute infarction involving the lateral margin of the right thalamus/adjacent deep white matter. There is moderate cerebral atrophy. Patchy to confluent T2 hyperintensities in the subcortical and deep cerebral white matter and pons have mildly progressed from the prior MRI and are compatible with moderate chronic small vessel ischemic disease. Chronic lacunar infarcts are present in the basal ganglia, thalami, and deep cerebral white matter bilaterally. Numerous foci of chronic microhemorrhage are again seen in the deep gray nuclei and bilateral cerebral and cerebellar hemispheres, mildly increased in number from the prior MRI and likely reflecting chronic hypertension. No mass, midline shift, or extra-axial fluid collection is seen. Vascular: Major intracranial vascular flow voids are preserved. Skull and upper cervical spine: Heterogeneously diminished bone marrow signal intensity in the skull and visualized cervical spine likely reflecting chronic renal failure and anemia. Sinuses/Orbits: Unremarkable orbits. Paranasal sinuses and mastoid air cells are clear. Other: None. IMPRESSION: 1. Punctate acute infarcts along the lateral margin of the right thalamus. 2. Moderate chronic small vessel ischemic disease. Electronically Signed   By: Logan Bores M.D.   On: 06/13/2016 16:57   Mr Jodene Nam Head/brain HY Cm  Result Date: 06/13/2016 CLINICAL DATA:  Initial evaluation for acute stroke. EXAM: MRA HEAD WITHOUT CONTRAST TECHNIQUE: Angiographic images of the Circle of Willis were obtained using MRA technique without intravenous contrast. COMPARISON:  Prior MRI from earlier same day. FINDINGS: ANTERIOR CIRCULATION: Distal cervical segments of the internal carotid arteries are patent with antegrade flow. Petrous segments widely patent bilaterally. Atheromatous irregularity throughout the cavernous ICAs  without flow-limiting stenosis. Changes slightly worse on the left were there is mild to moderate narrowing at the anterior genu. ICA termini widely patent. A1 segments patent bilaterally without stenosis. Anterior communicating artery normal. Anterior cerebral arteries demonstrate mild atheromatous irregularity without flow-limiting stenosis. PCAs are patent to their distal aspects. Mild atheromatous irregularity within the M1 segments without flow-limiting stenosis. MCA bifurcations within normal limits. No proximal M2 occlusion. Distal MCA branches well opacified and symmetric. POSTERIOR CIRCULATION: Vertebral arteries patent to the vertebrobasilar junction. Posterior inferior cerebral arteries patent bilaterally. Basilar artery widely patent. Superior cerebellar arteries patent bilaterally. Both of the posterior cerebral artery is predominantly supplied via the basilar and are widely patent to their distal aspects. Small right posterior communicating artery noted. No aneurysm or vascular malformation. IMPRESSION: Mild atherosclerotic changes involving the intracranial circulation as above. No large vessel occlusion identified. No high-grade or correctable stenosis. Electronically Signed   By: Jeannine Boga M.D.   On: 06/13/2016 21:04   Dg Hip Unilat W Or Wo Pelvis 2-3 Views Right  Result Date: 06/13/2016 CLINICAL DATA:  Right leg weakness and pain, no known injury,  initial encounter EXAM: DG HIP (WITH OR WITHOUT PELVIS) 3VRIGHT COMPARISON:  None. FINDINGS: Pelvic ring is intact. No acute fracture or dislocation is noted. No gross soft tissue abnormality is seen. IMPRESSION: No acute abnormality noted. Electronically Signed   By: Inez Catalina M.D.   On: 06/13/2016 07:11   Ct Head Code Stroke W/o Cm  Result Date: 06/13/2016 CLINICAL DATA:  Code stroke. Right-sided weakness. Hypertension, diabetes, dialysis EXAM: CT HEAD WITHOUT CONTRAST TECHNIQUE: Contiguous axial images were obtained from the base  of the skull through the vertex without intravenous contrast. COMPARISON:  CT head 06/13/2016 FINDINGS: Brain: Negative for acute infarct.  Negative for hemorrhage or mass Mild atrophy. Chronic microvascular ischemic change in the white matter. Chronic infarcts in the basal ganglia bilaterally including the left internal capsule anteriorly. Bilateral putamen infarcts. Vascular: No hyperdense vessel or unexpected calcification. Skull: Negative Sinuses/Orbits: Negative Other: None ASPECTS ([place] Stroke Program Early CT Score) - Ganglionic level infarction (caudate, lentiform nuclei, internal capsule, insula, M1-M3 cortex): 7 - Supraganglionic infarction (M4-M6 cortex): 3 Total score (0-10 with 10 being normal): 10 IMPRESSION: 1. No acute intracranial abnormality 2. ASPECTS is 10 3. Moderate chronic ischemic changes in the white matter and basal ganglia bilaterally. These results were called by telephone at the time of interpretation on 06/13/2016 at 12:36 pm to Dr. [name], who verbally acknowledged these results. Electronically Signed   By: Franchot Gallo M.D.   On: 06/13/2016 12:36        Scheduled Meds: . carvedilol  3.125 mg Oral BID WC  . clopidogrel  75 mg Oral Daily  . enoxaparin (LOVENOX) injection  30 mg Subcutaneous Q24H  . rosuvastatin  40 mg Oral Daily   Continuous Infusions:   LOS: 1 day    Dron Tanna Furry, MD [facility] Pager [phone]  If 7PM-7AM, please contact night-coverage [email or web address] Password [email or web address] 06/14/2016, 11:20 AM

## 2016-06-14 NOTE — Consult Note (Signed)
[name] Consultation Note    Indication for Consultation:  Management of ESRD/hemodialysis, anemia, hypertension/volume, and secondary hyperparathyroidism.  HPI: [name] is a 71 y.o. male with ESRD, CAD, HTN, Type 2 DM, Hx CVA who was admitted with RLE weakness, later found to have small punctate acute CVA on MRI.  Reports that he has been in his usual state of health except RLE weakness which has been intermittently at issue for the past 1-2 weeks. Yesterday at HD (3/30), he was noted to have worsened leg weakness and slurred speech. He was brought to ED via EMS. Head CT negative. Neuro consulted, no tPA given (out of time window). MRI later showed acute CVA. Also, found to have Hgb 6.8 and was transfused 1U PRBCs.  Did not complete full HD on 3/30, but got ~3 hours. Dialyzes MWF at [facility] in [place], has been on HD for ~ 1 year. He denies any dyspnea, CP, abdominal pain, N/V/D, or fever. Labs fine today except Hgb down again to 7.2, but relatively asymptomatic. No signs of GI loss currently, but Hx recurrent GI bleed in past.  Past Medical History:  Diagnosis Date  . 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 Anemia, iron deficiency 03/24/2011   a. Recurrent GI bleed, tx with periodic iron infusions.  . 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 AVM (arteriovenous malformation) of duodenum, acquired    egds in 01/2012, 04/2011  . BPH (benign prostatic hyperplasia)   . CAD (coronary artery disease)    a. Cath 09/2012: moderate borderline CAD in mid LAD/small diagonal branch, mild RCA stenosis, to be managed medically   . Chronic lower back pain   . Diabetic peripheral neuropathy ([place]) 2014   foot pain.  . Fatty liver    on CT of 11/2010  . GERD (gastroesophageal reflux disease)   . GI bleed    a. Recurrent GI bleed, tx with IV iron. b. Per heme notes - likely AVMs 04/2012 (tx with cauterization several months ago).  . Hematuria    a. Urology note scan from  07/2012: cystoscopy without evidence for bladder lesion, only lateral hypertrophy of posterior urethra, bladder impression from BPH. b. Pt states he had "some tests" scheduled for later in July 2014.  . High cholesterol   . Hypertension   . 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 Orthostatic hypotension    a. Tx with florinef.  . Peripheral neuropathy ([place])   . Polyp, colonic    Colonoscopy 01/2012 "benign" polyp  . Prostate cancer ([place])   . Sleep apnea    "had mask; couldn't sleep in it" (09/20/2012)  . Smoker   . Stage III chronic kidney disease    a. Stage 3 (DM with complications ->CKD, peripheral neuropathy).  . Stroke ([place])    x 2  . Type II diabetes mellitus ([place])    a. Dx 1994, uncontrolled.    Past Surgical History:  Procedure Laterality Date  . ENTEROSCOPY N/A 03/01/2013   Procedure: ENTEROSCOPY;  Surgeon: Inda Castle, MD;  Location: [place];  Service: Endoscopy;  Laterality: N/A;  . GIVENS CAPSULE STUDY N/A 04/05/2015   Procedure: GIVENS CAPSULE STUDY;  Surgeon: Gatha Mayer, MD;  Location: [place];  Service: Endoscopy;  Laterality: N/A;  . INGUINAL HERNIA REPAIR Right 2011  . LEFT HEART CATHETERIZATION WITH CORONARY ANGIOGRAM N/A 09/21/2012   Procedure: LEFT HEART CATHETERIZATION WITH CORONARY ANGIOGRAM;  Surgeon: Peter M Martinique, MD;  Location[facility] C[facility];  Service: Cardiovascular;  Laterality: N/A;  . LUMBAR DISC SURGERY  1980's  . LYMPHADENECTOMY Bilateral 01/19/2013   Procedure: LYMPHADENECTOMY;  Surgeon: Bernestine Amass, MD;  Location: [facility];  Service: Urology;  Laterality: Bilateral;  . ROBOT ASSISTED LAPAROSCOPIC RADICAL PROSTATECTOMY N/A 01/19/2013   Procedure: ROBOTIC ASSISTED LAPAROSCOPIC RADICAL PROSTATECTOMY;  Surgeon: Bernestine Amass, MD;  Location: [facility];  Service: Urology;  Laterality: N/A;  . SHOULDER OPEN ROTATOR CUFF REPAIR Left 1980's   Family History  Problem Relation Age of Onset  . Stroke Father     Died at 35  . Diabetes  Mother   . Heart attack Brother     Died at 4  . Diabetes Sister   . Stomach cancer Brother   . Heart attack Sister    Social History:  reports that he has been smoking Cigarettes.  He started smoking about 48 years ago. He has a 43.00 pack-year smoking history. He has never used smokeless tobacco. He reports that he does not drink alcohol or use drugs.  ROS: As per HPI otherwise negative.  Physical Exam: Vitals:   06/14/16 0100 06/14/16 0248 06/14/16 0500 06/14/16 1000  BP: (!) 151/83 (!) 144/82 (!) 146/87 (!) 148/70  Pulse: 95 100 100 99  Resp: 18 20 18 18   Temp:   98.1 F (36.7 C) 98.7 F (37.1 C)  TempSrc:   Oral Oral  SpO2: 100% 100% 96% 100%  Weight:      Height:         General: Well developed, well nourished, in no acute distress. Head: Normocephalic, atraumatic, sclera non-icteric, mucus membranes are moist. Neck: Supple without lymphadenopathy/masses.  Lungs: Clear bilaterally to auscultation without wheezes, rales, or rhonchi. Breathing is unlabored. Heart: RRR with normal S1, S2. No murmurs, rubs, or gallops appreciated. Abdomen: Soft, non-tender. Musculoskeletal:  Strength and tone appear normal for age. Lower extremities: No edema or ischemic changes, no open wounds. Neuro: Alert and oriented X 3. Moves all extremities spontaneously. Psych:  Responds to questions appropriately with a normal affect. Dialysis Access: TDC in R chest, maturing L forearm AVF + bruit.  No Known Allergies Prior to Admission medications   Medication Sig Start Date End Date Taking? Authorizing Provider  benzonatate (TESSALON) 100 MG capsule Take 1 capsule (100 mg total) by mouth 3 (three) times daily as needed for cough. Patient not taking: Reported on 06/13/2016 04/30/16   Mackie Pai, PA-C  carvedilol (COREG) 3.125 MG tablet TAKE 1 TABLET BY MOUTH TWICE A DAY WITH A MEAL 10/16/15   Historical Provider, MD  clopidogrel (PLAVIX) 75 MG tablet Take 1 tablet (75 mg total) by mouth  daily. Patient not taking: Reported on 06/13/2016 04/01/16   Inda Coke, PA  doxycycline (VIBRA-TABS) 100 MG tablet Take 1 tablet (100 mg total) by mouth 2 (two) times daily. Can give caps or generic Patient not taking: Reported on 06/13/2016 04/30/16   Mackie Pai, PA-C  HYDROcodone-acetaminophen (NORCO/VICODIN) 5-325 MG tablet Take 1 tablet by mouth every 6 (six) hours as needed. Patient not taking: Reported on 06/13/2016 06/13/16   Orpah Greek, MD  loratadine (CLARITIN) 10 MG tablet Take 1 tablet (10 mg total) by mouth every other day. Patient not taking: Reported on 06/13/2016 10/25/15   [name], NP  nitroGLYCERIN (NITROSTAT) 0.4 MG SL tablet Place 1 tablet (0.4 mg total) under the tongue every 5 (five) minutes as needed for chest pain (up to 3 doses). Patient not taking: Reported on 06/13/2016 10/08/15  [name], NP  Nutritional Supplements (FEEDING SUPPLEMENT, NEPRO CARB STEADY,) LIQD Take 237 mLs by mouth 3 (three) times daily between meals. Patient not taking: Reported on 06/13/2016 10/12/15   [name], NP  rosuvastatin (CRESTOR) 40 MG tablet Take 1 tablet (40 mg total) by mouth daily. Patient not taking: Reported on 06/13/2016 04/04/16   Inda Coke, PA   Medications:   . carvedilol  3.125 mg Oral BID WC  . clopidogrel  75 mg Oral Daily  . enoxaparin (LOVENOX) injection  30 mg Subcutaneous Q24H  . rosuvastatin  40 mg Oral Daily    Recent Labs Lab 06/13/16 1201 06/13/16 1222 06/14/16 0358  NA 140 142 141  K 3.5 3.8 3.8  CL 101 100* 102  CO2 29  --  28  GLUCOSE 124* 124* 101*  BUN 14 19 26*  CREATININE 3.88* 3.70* 5.55*  CALCIUM 8.0*  --  7.8*     Recent Labs Lab 06/13/16 1201  AST 16  ALT 10*  ALKPHOS 104  BILITOT 0.7  PROT 6.5  ALBUMIN 3.2*     Recent Labs Lab 06/13/16 1201 06/13/16 1222 06/14/16 0358  WBC 6.5  --  8.3  NEUTROABS 5.2  --   --   HGB 6.8* 7.8* 7.2*  HCT 21.2* 23.0* 21.8*  MCV 94.2  --  91.6   PLT 231  --  226     Recent Labs Lab 06/13/16 1203 06/13/16 2155 06/14/16 0703  GLUCAP 113* 200* 86   Studies/Results: Dg Chest 2 View  Result Date: 06/13/2016 CLINICAL DATA:  Initial evaluation for acute stroke. EXAM: CHEST  2 VIEW COMPARISON:  Prior radiograph from 04/30/2016. FINDINGS: Dual lumen right-sided central venous catheter in place with tip overlying the cavoatrial junction. Cardiac and mediastinal silhouettes are stable, and remain within normal limits. Mild aortic atherosclerosis. Lungs normally inflated. No focal infiltrate, pulmonary edema, or pleural effusion. No pneumothorax. No acute osseous abnormality. IMPRESSION: 1. No active cardiopulmonary disease. 2. Mild aortic atherosclerosis. Electronically Signed   By: Jeannine Boga M.D.   On: 06/13/2016 18:10   Ct Head Wo Contrast  Result Date: 06/13/2016 CLINICAL DATA:  Right leg weakness this morning. EXAM: CT HEAD WITHOUT CONTRAST TECHNIQUE: Contiguous axial images were obtained from the base of the skull through the vertex without intravenous contrast. COMPARISON:  Head CT 11/27/2015 FINDINGS: Brain: Chronic lacunar infarcts in the basal basal ganglia and periventricular white matter, stable from prior exam. Generalized atrophy and chronic small vessel ischemia. No intracranial hemorrhage, mass effect, or midline shift. No hydrocephalus. The basilar cisterns are patent. No evidence of territorial infarct. No intracranial fluid collection. Vascular: Atherosclerosis of skullbase vasculature without hyperdense vessel or abnormal calcification. Skull: Normal. Negative for fracture or focal lesion. Sinuses/Orbits: Paranasal sinuses and mastoid air cells are clear. The visualized orbits are unremarkable. Other: None. IMPRESSION: 1.  No acute intracranial abnormality. 2. Stable atrophy, chronic and remote ischemia. Electronically Signed   By: Jeb Levering M.D.   On: 06/13/2016 06:45   [name] Contrast (neuro  Protocol)  Result Date: 06/13/2016 CLINICAL DATA:  Right leg weakness.  Groin pain. EXAM: MRI HEAD WITHOUT CONTRAST TECHNIQUE: Multiplanar, multiecho pulse sequences of the brain and surrounding structures were obtained without intravenous contrast. COMPARISON:  Head CT 06/13/2016 and MRI 04/04/2015 FINDINGS: Brain: There are 2 subcentimeter foci of acute infarction involving the lateral margin of the right thalamus/adjacent deep white matter. There is moderate cerebral atrophy. Patchy to confluent T2 hyperintensities in the subcortical and  deep cerebral white matter and pons have mildly progressed from the prior MRI and are compatible with moderate chronic small vessel ischemic disease. Chronic lacunar infarcts are present in the basal ganglia, thalami, and deep cerebral white matter bilaterally. Numerous foci of chronic microhemorrhage are again seen in the deep gray nuclei and bilateral cerebral and cerebellar hemispheres, mildly increased in number from the prior MRI and likely reflecting chronic hypertension. No mass, midline shift, or extra-axial fluid collection is seen. Vascular: Major intracranial vascular flow voids are preserved. Skull and upper cervical spine: Heterogeneously diminished bone marrow signal intensity in the skull and visualized cervical spine likely reflecting chronic renal failure and anemia. Sinuses/Orbits: Unremarkable orbits. Paranasal sinuses and mastoid air cells are clear. Other: None. IMPRESSION: 1. Punctate acute infarcts along the lateral margin of the right thalamus. 2. Moderate chronic small vessel ischemic disease. Electronically Signed   By: Logan Bores M.D.   On: 06/13/2016 16:57   Mr Jodene Nam Head/brain TK Cm  Result Date: 06/13/2016 CLINICAL DATA:  Initial evaluation for acute stroke. EXAM: MRA HEAD WITHOUT CONTRAST TECHNIQUE: Angiographic images of the Circle of Willis were obtained using MRA technique without intravenous contrast. COMPARISON:  Prior MRI from earlier  same day. FINDINGS: ANTERIOR CIRCULATION: Distal cervical segments of the internal carotid arteries are patent with antegrade flow. Petrous segments widely patent bilaterally. Atheromatous irregularity throughout the cavernous ICAs without flow-limiting stenosis. Changes slightly worse on the left were there is mild to moderate narrowing at the anterior genu. ICA termini widely patent. A1 segments patent bilaterally without stenosis. Anterior communicating artery normal. Anterior cerebral arteries demonstrate mild atheromatous irregularity without flow-limiting stenosis. PCAs are patent to their distal aspects. Mild atheromatous irregularity within the M1 segments without flow-limiting stenosis. MCA bifurcations within normal limits. No proximal M2 occlusion. Distal MCA branches well opacified and symmetric. POSTERIOR CIRCULATION: Vertebral arteries patent to the vertebrobasilar junction. Posterior inferior cerebral arteries patent bilaterally. Basilar artery widely patent. Superior cerebellar arteries patent bilaterally. Both of the posterior cerebral artery is predominantly supplied via the basilar and are widely patent to their distal aspects. Small right posterior communicating artery noted. No aneurysm or vascular malformation. IMPRESSION: Mild atherosclerotic changes involving the intracranial circulation as above. No large vessel occlusion identified. No high-grade or correctable stenosis. Electronically Signed   By: Jeannine Boga M.D.   On: 06/13/2016 21:04   Dg Hip Unilat W Or Wo Pelvis 2-3 Views Right  Result Date: 06/13/2016 CLINICAL DATA:  Right leg weakness and pain, no known injury, initial encounter EXAM: DG HIP (WITH OR WITHOUT PELVIS) 3VRIGHT COMPARISON:  None. FINDINGS: Pelvic ring is intact. No acute fracture or dislocation is noted. No gross soft tissue abnormality is seen. IMPRESSION: No acute abnormality noted. Electronically Signed   By: Inez Catalina M.D.   On: 06/13/2016 07:11    Ct Head Code Stroke W/o Cm  Result Date: 06/13/2016 CLINICAL DATA:  Code stroke. Right-sided weakness. Hypertension, diabetes, dialysis EXAM: CT HEAD WITHOUT CONTRAST TECHNIQUE: Contiguous axial images were obtained from the base of the skull through the vertex without intravenous contrast. COMPARISON:  CT head 06/13/2016 FINDINGS: Brain: Negative for acute infarct.  Negative for hemorrhage or mass Mild atrophy. Chronic microvascular ischemic change in the white matter. Chronic infarcts in the basal ganglia bilaterally including the left internal capsule anteriorly. Bilateral putamen infarcts. Vascular: No hyperdense vessel or unexpected calcification. Skull: Negative Sinuses/Orbits: Negative Other: None ASPECTS ([place] Stroke Program Early CT Score) - Ganglionic level infarction (caudate, lentiform nuclei, internal capsule, insula,  M1-M3 cortex): 7 - Supraganglionic infarction (M4-M6 cortex): 3 Total score (0-10 with 10 being normal): 10 IMPRESSION: 1. No acute intracranial abnormality 2. ASPECTS is 10 3. Moderate chronic ischemic changes in the white matter and basal ganglia bilaterally. These results were called by telephone at the time of interpretation on 06/13/2016 at 12:36 pm to Dr. [name], who verbally acknowledged these results. Electronically Signed   By: Franchot Gallo M.D.   On: 06/13/2016 12:36    Dialysis Orders:  MWF at [facility]) 3:30, F200 dialyzer, 2K/2.5Ca, BFR400/DFR800, TDC, EDW 140lb (63.6kg) * Records review Hgb 6.2 on 05/21/16, tsat 27% with ferritin 371 on 3/7. - Aranesp 200 being given weekly, last 3/28. - Hectoral 2.76mcg IV q HD - Parsabiv 2.68mcg IV q HD  Assessment/Plan: 1.  Acute CVA: Slurred speech resolved, still with R leg paresthesias (sensation and strength reasonably intact). Secondary work-up ongoing per primary. On plavix. 2.  ESRD: Usually MWF schedule. Last HD was cut short, and he was given 1U PRBCs on 3/30, but BP/volume and labs are ok.  He has no symptoms of volume overload at this time. I think we can try to wait for his regular HD day which would be 4/2. 3.  Hypertension/volume: BP slightly high. No symptoms of volume overload, CXR 3/30 clear. Will remove fluid with next HD. 4.  Anemia: S/p 1 U PRBCs 3/30 for Hgb 6.8, Hgb 7.2 today. Waiting on HD records, but will likely resume IV iron/ESA. If Hgb drops below 7 again, will plan to transfuse with HD. Would check stool guaiacs given Hx GIB. 5.  Metabolic bone disease: Ca ok, Phos pending. ?Not on binders. 6.  Nutrition: Alb low, continue Nepro. 7.  CAD: Per primary.  Veneta Penton, PA-C 06/14/2016, 12:12 PM  [facility] Pager: [phone]  I have seen and examined this patient and agree with plan and assessment in the above note with renal recommendations/intervention highlighted. [facility] [place] [facility] pt (MWF) admitted w/acute punctate R thalamic stroke. Neuro is evaluating. Pt received an incomplete HD on Friday, but K, volume look fine and should be OK to wait until Monday for next Rx. Will check phos - not on binders - pt obviously w/PTH issues (on both hectorol and Parsabiv). Hb only 7.2 post transfusion of 1 unit. Will transfuse on Monday with HD if at 7 or less  Jaeson Molstad B,MD 06/14/2016 3:04 PM

## 2016-06-14 NOTE — Evaluation (Signed)
Occupational Therapy Evaluation Patient Details Name: [name] MRN: [id number] DOB: 1946-02-25 Today's Date: 06/14/2016    History of Present Illness This 71 y.o. male admitted with new onset Rt LE weakness and slurred speach.  MRI of brain showed punctate acute infarcts along the lateral margin of the Rt thalamus.   PMH includes:  ESRD with HD, h/o CVA, DM, OSA, orthostatic hypotension, HTN    Clinical Impression   Pt admitted with above. He demonstrates the below listed deficits and will benefit from continued OT to maximize safety and independence with BADLs.  Pt presents to OT with impaired balance, generalized weakness, impaired vision including impaired saccades, cognitive deficits including deficits with attention and memory.   Wife also reports mood swings.   He requires min guard assist for ADLs.  Recommend HHOT, and recommend that pt not drive until cleared by therapies and MD.         Follow Up Recommendations  Home health OT;Supervision/Assistance - 24 hour    Equipment Recommendations  None recommended by OT    Recommendations for Other Services       Precautions / Restrictions Precautions Precautions: Fall      Mobility Bed Mobility Overal bed mobility: Modified Independent                Transfers Overall transfer level: Needs assistance   Transfers: Sit to/from Stand;Stand Pivot Transfers Sit to Stand: Min guard Stand pivot transfers: Min guard            Balance Overall balance assessment: Needs assistance Sitting-balance support: Feet supported Sitting balance-Leahy Scale: Good     Standing balance support: No upper extremity supported Standing balance-Leahy Scale: Fair                             ADL either performed or assessed with clinical judgement   ADL Overall ADL's : Needs assistance/impaired Eating/Feeding: Independent   Grooming: Wash/dry hands;Wash/dry face;Oral care;Brushing hair;Min guard;Standing    Upper Body Bathing: Supervision/ safety;Sitting   Lower Body Bathing: Min guard;Sit to/from stand   Upper Body Dressing : Supervision/safety;Set up;Sitting   Lower Body Dressing: Min guard;Sit to/from stand   Toilet Transfer: Min guard;Ambulation;Comfort height toilet;RW;Grab bars   Toileting- Clothing Manipulation and Hygiene: Min guard;Sit to/from stand       Functional mobility during ADLs: Min guard;Rolling walker       Vision Baseline Vision/History: Wears glasses Patient Visual Report: No change from baseline Vision Assessment?: Yes Eye Alignment: Within Functional Limits Ocular Range of Motion: Within Functional Limits Alignment/Gaze Preference: Within Defined Limits Tracking/Visual Pursuits: Able to track stimulus in all quads without difficulty Saccades: Decreased speed of saccadic movement Visual Fields: No apparent deficits     Agricultural engineer 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?: Yes   Praxis Praxis Praxis tested?: Within functional limits    Pertinent Vitals/Pain Pain Assessment: No/denies pain     Hand Dominance Right   Extremity/Trunk Assessment Upper Extremity Assessment Upper Extremity Assessment: Overall WFL for tasks assessed   Lower Extremity Assessment Lower Extremity Assessment: Defer to PT evaluation   Cervical / Trunk Assessment Cervical / Trunk Assessment: Normal   Communication Communication Communication: No difficulties   Cognition Arousal/Alertness: Awake/alert Behavior During Therapy: WFL for tasks assessed/performed;Flat affect Overall Cognitive Status: Impaired/Different from baseline Area of Impairment: Attention;Memory;Problem solving                   Current Attention Level: Selective Memory: Decreased  short-term memory       Problem Solving: Slow processing General Comments: Wife reports progressive memory changes that have worsened over the past couple of weeks and increased irritabiilty and moodiness    General  Comments  discussed therapeutic horseback riding programs with pt and wife     Exercises     Shoulder Instructions      Home Living Family/patient expects to be discharged to:: Private residence Living Arrangements: Spouse/significant other Available Help at Discharge: Family;Available PRN/intermittently Type of Home: House Home Access: Stairs to enter [employer] of Steps: 1   Home Layout: One level     Bathroom Shower/Tub: Teacher, early years/pre: Standard     Home Equipment: [employer] - single point   Additional Comments: wife works during the day       Prior Functioning/Environment Level of Independence: Independent with assistive device(s)  Gait / Transfers Assistance Needed: Pt occasionally ambulated with cane  ADL's / Homemaking Assistance Needed: wife performs IADLs - pt was independent with ADLs.     Comments: Pt drives.  He likes to ride his horse, but hasn't been able in past 2 years due to medical issues         OT Problem List: Decreased strength;Decreased activity tolerance;Impaired balance (sitting and/or standing);Decreased cognition;Decreased safety awareness;Decreased knowledge of use of DME or AE;Impaired vision/perception      OT Treatment/Interventions: Self-care/ADL training;DME and/or AE instruction;Therapeutic activities;Cognitive remediation/compensation;Visual/perceptual remediation/compensation;Patient/family education;Balance training    OT Goals(Current goals can be found in the care plan section) Acute Rehab OT Goals Patient Stated Goal: to get better  OT Goal Formulation: With patient/family Time For Goal Achievement: 06/21/16 Potential to Achieve Goals: Good ADL Goals Pt Will Perform Grooming: with modified independence;standing Pt Will Perform Upper Body Dressing: with modified independence;sitting Pt Will Perform Lower Body Dressing: with modified independence;sit to/from stand Pt Will Transfer to Toilet: with  modified independence;ambulating;regular height toilet;bedside commode;grab bars Pt Will Perform Toileting - Clothing Manipulation and hygiene: with modified independence;sit to/from stand Additional ADL Goal #1: Pt will perform simple path finding with supervision   OT Frequency: Min 2X/week   Barriers to D/C: Decreased caregiver support          Co-evaluation              End of Session Equipment Utilized During Treatment: Rolling walker;Gait belt Nurse Communication: Mobility status  Activity Tolerance: Patient tolerated treatment well Patient left: in chair;with call [place]/phone within reach;with chair alarm set;with family/visitor present  OT Visit Diagnosis: Unsteadiness on feet (R26.81)                Time: 0349-1791 OT Time Calculation (min): 56 min Charges:  OT General Charges $OT Visit: 1 Procedure OT Evaluation $OT Eval Moderate Complexity: 1 Procedure OT Treatments $Self Care/Home Management : 8-22 mins $Therapeutic Activity: 23-37 mins G-Codes:     [facility], OTR/L [phone]   Lucille Passy M 06/14/2016, 6:17 PM

## 2016-06-15 ENCOUNTER — Inpatient Hospital Stay (HOSPITAL_COMMUNITY): Payer: Medicare Other

## 2016-06-15 DIAGNOSIS — I6789 Other cerebrovascular disease: Secondary | ICD-10-CM

## 2016-06-15 DIAGNOSIS — D631 Anemia in chronic kidney disease: Secondary | ICD-10-CM

## 2016-06-15 DIAGNOSIS — R29898 Other symptoms and signs involving the musculoskeletal system: Secondary | ICD-10-CM

## 2016-06-15 DIAGNOSIS — N186 End stage renal disease: Secondary | ICD-10-CM

## 2016-06-15 DIAGNOSIS — Z992 Dependence on renal dialysis: Secondary | ICD-10-CM

## 2016-06-15 LAB — CBC
HEMATOCRIT: 22.6 % — AB (ref 39.0–52.0)
HEMOGLOBIN: 7.7 g/dL — AB (ref 13.0–17.0)
MCH: 30.9 pg (ref 26.0–34.0)
MCHC: 34.1 g/dL (ref 30.0–36.0)
MCV: 90.8 fL (ref 78.0–100.0)
Platelets: 253 10*3/uL (ref 150–400)
RBC: 2.49 MIL/uL — ABNORMAL LOW (ref 4.22–5.81)
RDW: 16.7 % — ABNORMAL HIGH (ref 11.5–15.5)
WBC: 7.1 10*3/uL (ref 4.0–10.5)

## 2016-06-15 LAB — ECHOCARDIOGRAM COMPLETE
Height: 67 in
Weight: 2187.2 oz

## 2016-06-15 MED ORDER — CLOPIDOGREL BISULFATE 75 MG PO TABS
75.0000 mg | ORAL_TABLET | Freq: Every day | ORAL | Status: DC
  Administered 2016-06-15: 75 mg via ORAL
  Filled 2016-06-15: qty 1

## 2016-06-15 MED ORDER — ASPIRIN EC 81 MG PO TBEC: 81.0000 mg | DELAYED_RELEASE_TABLET | Freq: Every day | ORAL | Status: DC

## 2016-06-15 MED ORDER — CLOPIDOGREL BISULFATE 75 MG PO TABS: 75.0000 mg | ORAL_TABLET | Freq: Every day | ORAL | 0 refills | Status: DC

## 2016-06-15 MED ORDER — ROSUVASTATIN CALCIUM 40 MG PO TABS: 40.0000 mg | ORAL_TABLET | Freq: Every day | ORAL | 0 refills | Status: DC

## 2016-06-15 MED ORDER — CARVEDILOL 3.125 MG PO TABS: 3.1250 mg | ORAL_TABLET | Freq: Two times a day (BID) | ORAL | 0 refills | Status: DC

## 2016-06-15 MED ORDER — PERFLUTREN LIPID MICROSPHERE
1.0000 mL | INTRAVENOUS | Status: AC | PRN
  Administered 2016-06-15: 2 mL via INTRAVENOUS
  Filled 2016-06-15: qty 10

## 2016-06-15 NOTE — Progress Notes (Signed)
qPhysical Therapy Treatment Patient Details Name: RUSHIL KIMBRELL MRN: [id number] DOB: November 02, 1945 Today's Date: 06/15/2016    History of Present Illness This 71 y.o. male admitted with new onset Rt LE weakness and slurred speach.  MRI of brain showed punctate acute infarcts along the lateral margin of the Rt thalamus.   PMH includes:  ESRD with HD, h/o CVA, DM, OSA, orthostatic hypotension, HTN     PT Comments    Patient able to ambulate with single point cane 180' with min guard assist for safety only.  No loss of balance during gait with cane.  Continue to agree with HHPT f/u at d/c for continued therapy to address balance/mobility.    Follow Up Recommendations  Home health PT;Supervision for mobility/OOB     Equipment Recommendations  None recommended by PT (Patient has a cane at home)    Recommendations for Other Services       Precautions / Restrictions Precautions Precautions: Fall Restrictions Weight Bearing Restrictions: No    Mobility  Bed Mobility Overal bed mobility: Modified Independent                Transfers Overall transfer level: Needs assistance Equipment used: Straight cane Transfers: Sit to/from Stand Sit to Stand: Supervision         General transfer comment: Verbal cues for safe use of cane.  No physical assist needed.  Ambulation/Gait Ambulation/Gait assistance: Min guard Ambulation Distance (Feet): 160 Feet Assistive device: Straight cane Gait Pattern/deviations: Step-through pattern;Decreased stride length;Drifts right/left Gait velocity: decreased Gait velocity interpretation: Below normal speed for age/gender General Gait Details: Verbal cues for safe use of cane.  Patient with slower, slightly unsteady gait.  Drifts toward Rt wall, but does not run into anything today.  No loss of balance during gait.   Stairs            Wheelchair Mobility    Modified Rankin (Stroke Patients Only) Modified Rankin (Stroke Patients  Only) Pre-Morbid Rankin Score: No significant disability Modified Rankin: Moderately severe disability     Balance Overall balance assessment: Needs assistance         Standing balance support: No upper extremity supported Standing balance-Leahy Scale: Fair                              Cognition Arousal/Alertness: Awake/alert Behavior During Therapy: WFL for tasks assessed/performed;Flat affect Overall Cognitive Status: Impaired/Different from baseline Area of Impairment: Attention;Memory;Problem solving                   Current Attention Level: Selective Memory: Decreased short-term memory       Problem Solving: Slow processing        Exercises      General Comments        Pertinent Vitals/Pain Pain Assessment: No/denies pain    Home Living                      Prior Function            PT Goals (current goals can now be found in the care plan section) Acute Rehab PT Goals Patient Stated Goal: to get better  Progress towards PT goals: Progressing toward goals    Frequency    Min 4X/week      PT Plan Current plan remains appropriate    Co-evaluation             End of  Session Equipment Utilized During Treatment: Gait belt Activity Tolerance: Patient tolerated treatment well Patient left: in chair;with call Byrd/phone within reach;with chair alarm set;with family/visitor present Nurse Communication: Mobility status PT Visit Diagnosis: Unsteadiness on feet (R26.81);Other abnormalities of gait and mobility (R26.89)     Time: 7482-7078 PT Time Calculation (min) (ACUTE ONLY): 10 min  Charges:  $Gait Training: 8-22 mins                    G Codes:       Carita Pian. Sanjuana Kava, [facility] Acute Rehab Services Pager [phone]    Despina Pole 06/15/2016, 12:21 PM

## 2016-06-15 NOTE — Progress Notes (Signed)
VASCULAR LAB PRELIMINARY  PRELIMINARY  PRELIMINARY  PRELIMINARY  Carotid duplex completed.    Preliminary report:  1-39% ICA plaquing. Vertebral artery flow is antegrade.   Thera Basden, RVT 06/15/2016, 11:02 AM

## 2016-06-15 NOTE — Progress Notes (Signed)
STROKE TEAM PROGRESS NOTE   HISTORY OF PRESENT ILLNESS (per record)  [name] is a 71 y.o. male he reports that his right leg has not been working very well for the past 2 weeks. He also describes that it feels slightly numb, but I'm not sure if he means numb or weak because he describes numbness as "it is not working right."  Initially a code stroke was called because he is a very poor historian and when asked when he was last normal, he initially reports 6 AM. I then ask him when things started and he again reports 6 AM. I then asked when it was last completely normal, and he reported 2 weeks ago. He has been having progressive difficulty with walking since then.  He was seen at dialysis were hemoglobin was measured and was found to significantly low. 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   [name]: At least 2 weeks ago tpa given?: no, out of window   SUBJECTIVE (INTERVAL HISTORY) His  wife is at the bedside.   He is getting a sponge bath. Carotid dopplers were normal and echo results are pending  OBJECTIVE Temp:  [97.8 F (36.6 C)-99.1 F (37.3 C)] 99 F (37.2 C) (04/01 0600) Pulse Rate:  [93-99] 95 (04/01 0600) Cardiac Rhythm: Normal sinus rhythm (04/01 0827) Resp:  [16-20] 20 (04/01 0600) BP: (148-174)/(68-85) 167/78 (04/01 0600) SpO2:  [95 %-100 %] 96 % (04/01 0600)  CBC:   Recent Labs Lab 06/13/16 1201  06/14/16 0358 06/15/16 0342  WBC 6.5  --  8.3 7.1  NEUTROABS 5.2  --   --   --   HGB 6.8*  < > 7.2* 7.7*  HCT 21.2*  < > 21.8* 22.6*  MCV 94.2  --  91.6 90.8  PLT 231  --  226 253  < > = values in this interval not displayed.  Basic Metabolic Panel:   Recent Labs Lab 06/13/16 1201 06/13/16 1222 06/14/16 0358  NA 140 142 141  K 3.5 3.8 3.8  CL 101 100* 102  CO2 29  --  28  GLUCOSE 124* 124* 101*  BUN 14 19 26*  CREATININE 3.88* 3.70* 5.55*  CALCIUM 8.0*  --  7.8*    Lipid Panel:      Component Value Date/Time   CHOL 131 06/14/2016 0358   TRIG 156 (H) 06/14/2016 0358   HDL 31 (L) 06/14/2016 0358   CHOLHDL 4.2 06/14/2016 0358   VLDL 31 06/14/2016 0358   LDLCALC 69 06/14/2016 0358   HgbA1c:  Lab Results  Component Value Date   HGBA1C 5.3 04/01/2016   Urine Drug Screen: No results found for: LABOPIA, COCAINSCRNUR, LABBENZ, AMPHETMU, THCU, LABBARB    IMAGING  Dg Chest 2 View  06/13/2016 1. No active cardiopulmonary disease.  2. Mild aortic atherosclerosis.     Ct Head Wo Contrast 06/13/2016 1.  No acute intracranial abnormality.  2. Stable atrophy, chronic and remote ischemia.     [name] Contrast (neuro Protocol) 06/13/2016 1. Punctate acute infarcts along the lateral margin of the right thalamus.  2. Moderate chronic small vessel ischemic disease.     Mr Jodene Nam Head/brain Wo Cm 06/13/2016 Mild atherosclerotic changes involving the intracranial circulation as above.  No large vessel occlusion identified.  No high-grade or correctable stenosis.    Dg Hip Unilat W Or Wo Pelvis 2-3 Views Right 06/13/2016 No acute abnormality noted.  Ct Head Code Stroke W/o Cm 06/13/2016 1. No acute intracranial abnormality  2. ASPECTS is 10  3. Moderate chronic ischemic changes in the white matter and basal ganglia bilaterally.     PHYSICAL EXAM Frail middle-aged African-American male not in distress. His left arm has a dialysis fistula. . Afebrile. Head is nontraumatic. Neck is supple without bruit.    Cardiac exam no murmur or gallop. Lungs are clear to auscultation. Distal pulses are well felt.  Neurological Exam ;  Awake  Alert oriented x 3. Normal speech and language.eye movements full without nystagmus.fundi were not visualized. Vision acuity and fields appear normal. Hearing is normal. Palatal movements are normal. Face symmetric. Tongue midline. Normal strength, tone, reflexes and coordination except for slight giveaway weakness in the right hip  flexors only. Normal sensation. Gait deferred.      ASSESSMENT/PLAN [name] is a 71 y.o. male with history of a previous stroke, diabetes mellitus, end-stage renal disease on dialysis, tobacco use, obstructive sleep apnea, orthostatic hypotension, hypertension, hyperlipidemia, previous GI bleed, coronary artery disease, and anemia presenting with right lower extremity weakness and numbness. He did not receive IV t-PA due to late presentation.  Strokes:  Punctate acute infarcts right thalamus - secondary to small vessel disease. Likely clinically silent and not responsible for his presentation  Resultant  no deficits from the stroke but subjective right leg weakness  MRI - Punctate acute infarcts along the lateral margin of the right thalamus.   MRA - No large vessel occlusion identified. No high-grade or correctable stenosis.  Carotid Doppler - no significant stenosis  2D Echo -  pending  LDL - 69  HgbA1c - pending  VTE prophylaxis - Lovenox Diet Heart Room service appropriate? Yes; Fluid consistency: Thin  clopidogrel 75 mg daily prior to admission, now on clopidogrel 75 mg daily  Patient counseled to be compliant with his antithrombotic medications  Ongoing aggressive stroke risk factor management  Therapy recommendations:  HHPT  Disposition: home  Hypertension  Stable  Permissive hypertension (OK if < 220/120) but gradually normalize in 5-7 days  Long-term BP goal normotensive  Hyperlipidemia  Home meds:  Coreg 40 mg daily (pt reportedly wasn't taking med) resumed in hospital  LDL 69, goal < 70  Continue statin at discharge  Diabetes  HgbA1c pending, goal < 7.0  Unc / Controlled  Other Stroke Risk Factors  Advanced age  Cigarette smoker - advised to stop smoking  Hx stroke/TIA  Family hx stroke (father)  Coronary artery disease  Obstructive sleep apnea, does not use CPAP at home  Other Active Problems  Anemia - 7.2 / 21.8  ESRD -  dialysis  Hospital day # 2 I have personally examined this patient, reviewed notes, independently viewed imaging studies, participated in medical decision making and plan of care.ROS completed by me personally and pertinent positives fully documented  I have made any additions or clarifications directly to the above note. The patient had stopped taking his aspirin. I counseled the patient to be compliant with his medications as well as aggressive risk factor modification and follow-up with his primary physician. Continue Plavix for stroke prevention. Patient also counseled to quit smoking and is willing.  Follow-up as an outpatient in the stroke clinic. Kindly call for questions. Discussed with Dr. Cherre Robins, MD  To contact Stroke Continuity provider, please refer to [email or web address] After hours, contact General Neurology

## 2016-06-15 NOTE — Discharge Summary (Signed)
Physician Discharge Summary  [name] IZT:[id number] DOB: 03-Mar-1946 DOA: 06/13/2016  PCP: [name], NP  Admit date: 06/13/2016 Discharge date: 06/15/2016  Admitted From:dialysis unit Disposition:home  Recommendations for Outpatient Follow-up:  1. Follow up with PCP in 1-2 weeks 2. Please obtain BMP/CBC in one week  Home Health:yes Equipment/Devices:cane Discharge Condition:stable CODE STATUS:full code Diet recommendation:heart healthy  Brief/Interim Summary: 71 y.o.malewith medical history significant of hypertension, coronary artery disease status post cardiac cath in 2014 on medical management, ESRD on hemodialysis Monday Wednesday Friday schedule, anemia of chronic kidney disease, history of GI bleed, history of prior stroke presented from hemodialysis unit after worsening right leg numbness associated with slurred speech. ED Course:in the ER patient was found to have anemia with hemoglobin of 7.8, CT scan of head and right hip x-ray with no acute finding. Initially his stroke code was called. Evaluated by neurologist recommended MRI of the brain. MRI consistent with a small punctate acute infarction. Received a unit of red blood cell transfusion in the ER. Admitted for further evaluation  # Acute punctate ischemic stroke at right thalamus: -patient with right lower extremity numbness, weakness and slurred speech likely contributed by acute stroke. Improved on discharge -apparently patient was not taking medications at home. Education provided to the patient. Prescription for medication provided for statin, plavix and coreg.  -MRA brain unremarkable -LDL 69 -carotid Doppler ultrasound, echocardiogram result reviewed, no embolic source -PT OT evaluated the patient recommended home care services. Patient is a very eager to go home. He said he has cane and lives with his wife. Home care services ordered on discharge. I educated patient regarding the importance of taking  medications. I discussed with Dr. Leonie Man from his stroke team and we agreed with the plan. Patient will receive a call from neurology for the follow-up.  # ESRD on hemodialysis: No plan for hemodialysis today as per nephrologist. Patient has maintenance scheduled hemodialysis tomorrow morning at 6 AM. I recommended patient to continue his appointment. He has no chest pain, shortness of breath. Electrolytes acceptable.  # essential hypertension: Mildly elevated blood pressure likely contributed by volume. Continue Coreg.  # anemia of chronic kidney disease: Received a unit of red blood cell in the ER. I advised patient to follow-up with PCP for the evaluation of anemia work up. He may need a stool occult blood test and possibly evaluation done by GI. He verbalized understanding. No sign of bleeding. He denied GI bleed. IV iron and ESA treatment during dialysis deferr to nephrologist.   #Right lower extremity numbness and weakness likely contributed by acute stroke on top of known history of stroke: Continue supportive care.  #mildly elevated troponin level likely demand ischemia in the setting of ESRD. EKG with no ischemic change. Echocardiogram with mildly reduced EF. On medical management.   Discharge Diagnoses:  Active Problems:   Stroke [name])    Discharge Instructions  Discharge Instructions    Call MD for:  difficulty breathing, headache or visual disturbances    Complete by:  As directed    Call MD for:  extreme fatigue    Complete by:  As directed    Call MD for:  hives    Complete by:  As directed    Call MD for:  persistant dizziness or light-headedness    Complete by:  As directed    Call MD for:  persistant nausea and vomiting    Complete by:  As directed    Call MD for:  severe  uncontrolled pain    Complete by:  As directed    Call MD for:  temperature >100.4    Complete by:  As directed    Diet - low sodium heart healthy    Complete by:  As directed    Discharge  instructions    Complete by:  As directed    Please resume your maintenance dialysis tomorrow morning Please follow up with your PCP and nephrologist for anemia evaluation.   Increase activity slowly    Complete by:  As directed      Allergies as of 06/15/2016   No Known Allergies     Medication List    STOP taking these medications   doxycycline 100 MG tablet Commonly known as:  VIBRA-TABS   feeding supplement (NEPRO CARB STEADY) Liqd   HYDROcodone-acetaminophen 5-325 MG tablet Commonly known as:  NORCO/VICODIN   loratadine 10 MG tablet Commonly known as:  CLARITIN   nitroGLYCERIN 0.4 MG SL tablet Commonly known as:  NITROSTAT     TAKE these medications   benzonatate 100 MG capsule Commonly known as:  TESSALON Take 1 capsule (100 mg total) by mouth 3 (three) times daily as needed for cough.   carvedilol 3.125 MG tablet Commonly known as:  COREG Take 1 tablet (3.125 mg total) by mouth 2 (two) times daily with a meal. What changed:  See the new instructions.   clopidogrel 75 MG tablet Commonly known as:  PLAVIX Take 1 tablet (75 mg total) by mouth daily.   rosuvastatin 40 MG tablet Commonly known as:  CRESTOR Take 1 tablet (40 mg total) by mouth daily.      Follow-up Information    [name], NP. Schedule an appointment as soon as possible for a visit in 1 week(s).   Specialty:  Internal Medicine Contact information: [place] [address] [phone]          No Known Allergies  Consultations: Neurologist Nephrologist Procedures/Studies: Echocardiogram, MRI  Subjective: Patient was seen and examined at bedside. He reported doing much better and wanted to go home today. Denied headache, dizziness, nausea, vomiting, chest pain or shortness of breath. Denies pain or weakness.  Discharge Exam: Vitals:   06/15/16 0211 06/15/16 0600  BP: (!) 174/82 (!) 167/78  Pulse: 99 95  Resp: 20 20  Temp: 98.7 F  (37.1 C) 99 F (37.2 C)   Vitals:   06/14/16 1800 06/14/16 2155 06/15/16 0211 06/15/16 0600  BP: (!) 165/85 (!) 173/76 (!) 174/82 (!) 167/78  Pulse: 93 97 99 95  Resp: 16 18 20 20   Temp: 97.8 F (36.6 C) 99.1 F (37.3 C) 98.7 F (37.1 C) 99 F (37.2 C)  TempSrc: Oral Oral Oral Oral  SpO2: 100% 100% 100% 96%  Weight:      Height:        General: Pt is alert, awake, not in acute distress Cardiovascular: RRR, S1/S2 +, no rubs, no gallops Respiratory: CTA bilaterally, no wheezing, no rhonchi Abdominal: Soft, NT, ND, bowel sounds + Extremities: no edema, no cyanosis, muscle strength 5 over 5 in all extremities.    The results of significant diagnostics from this hospitalization (including imaging, microbiology, ancillary and laboratory) are listed below for reference.     Microbiology: Recent Results (from the past 240 hour(s))  MRSA PCR Screening     Status: None   Collection Time: 06/14/16 10:51 AM  Result Value Ref Range Status   MRSA by PCR NEGATIVE  NEGATIVE Final    Comment:        The GeneXpert MRSA Assay (FDA approved for NASAL specimens only), is one component of a comprehensive MRSA colonization surveillance program. It is not intended to diagnose MRSA infection nor to guide or monitor treatment for MRSA infections.      Labs: BNP (last 3 results) No results for input(s): BNP in the last 8760 hours. Basic Metabolic Panel:  Recent Labs Lab 06/13/16 1201 06/13/16 1222 06/14/16 0358  NA 140 142 141  K 3.5 3.8 3.8  CL 101 100* 102  CO2 29  --  28  GLUCOSE 124* 124* 101*  BUN 14 19 26*  CREATININE 3.88* 3.70* 5.55*  CALCIUM 8.0*  --  7.8*   Liver Function Tests:  Recent Labs Lab 06/13/16 1201  AST 16  ALT 10*  ALKPHOS 104  BILITOT 0.7  PROT 6.5  ALBUMIN 3.2*   No results for input(s): LIPASE, AMYLASE in the last 168 hours. No results for input(s): AMMONIA in the last 168 hours. CBC:  Recent Labs Lab 06/13/16 1201 06/13/16 1222  06/14/16 0358 06/15/16 0342  WBC 6.5  --  8.3 7.1  NEUTROABS 5.2  --   --   --   HGB 6.8* 7.8* 7.2* 7.7*  HCT 21.2* 23.0* 21.8* 22.6*  MCV 94.2  --  91.6 90.8  PLT 231  --  226 253   Cardiac Enzymes: No results for input(s): CKTOTAL, CKMB, CKMBINDEX, TROPONINI in the last 168 hours. BNP: Invalid input(s): POCBNP CBG:  Recent Labs Lab 06/13/16 1203 06/13/16 2155 06/14/16 0703  GLUCAP 113* 200* 86   D-Dimer No results for input(s): DDIMER in the last 72 hours. Hgb A1c No results for input(s): HGBA1C in the last 72 hours. Lipid Profile  Recent Labs  06/14/16 0358  CHOL 131  HDL 31*  LDLCALC 69  TRIG 156*  CHOLHDL 4.2   Thyroid function studies No results for input(s): TSH, T4TOTAL, T3FREE, THYROIDAB in the last 72 hours.  Invalid input(s): FREET3 Anemia work up No results for input(s): VITAMINB12, FOLATE, FERRITIN, TIBC, IRON, RETICCTPCT in the last 72 hours. Urinalysis    Component Value Date/Time   COLORURINE YELLOW 04/01/2016 [place] 04/01/2016 0939   LABSPEC >=1.030 (A) 04/01/2016 0939   PHURINE 5.5 04/01/2016 0939   GLUCOSEU NEGATIVE 04/01/2016 0939   HGBUR MODERATE (A) 04/01/2016 0939   BILIRUBINUR SMALL (A) 04/01/2016 0939   BILIRUBINUR small 06/21/2012 1041   KETONESUR TRACE (A) 04/01/2016 0939   PROTEINUR >300 (A) 12/25/2015 1028   UROBILINOGEN 0.2 04/01/2016 0939   NITRITE NEGATIVE 04/01/2016 0939   LEUKOCYTESUR NEGATIVE 04/01/2016 0939   Sepsis Labs Invalid input(s): PROCALCITONIN,  WBC,  LACTICIDVEN Microbiology Recent Results (from the past 240 hour(s))  MRSA PCR Screening     Status: None   Collection Time: 06/14/16 10:51 AM  Result Value Ref Range Status   MRSA by PCR NEGATIVE NEGATIVE Final    Comment:        The GeneXpert MRSA Assay (FDA approved for NASAL specimens only), is one component of a comprehensive MRSA colonization surveillance program. It is not intended to diagnose MRSA infection nor to guide  or monitor treatment for MRSA infections.      Time coordinating discharge: 31 minutes  SIGNED:   Rosita Fire, MD  [facility] 06/15/2016, 12:26 PM  If 7PM-7AM, please contact night-coverage [email or web address] Password [email or web address]

## 2016-06-15 NOTE — Progress Notes (Addendum)
Echocardiogram 2D Echocardiogram with 38ml Definity has been performed.  06/15/2016 10:43 AM Maudry Mayhew, BS, RVT, RDCS, RDMS

## 2016-06-15 NOTE — Progress Notes (Signed)
Occupational Therapy Treatment Patient Details Name: [name] MRN: [id number] DOB: [birth year] Today's Date: 06/15/2016    History of present illness This 71 y.o. male admitted with new onset Rt LE weakness and slurred speach.  MRI of brain showed punctate acute infarcts along the lateral margin of the Rt thalamus.   PMH includes:  ESRD with HD, h/o CVA, DM, OSA, orthostatic hypotension, HTN    OT comments  Pt eager for discharge.  He requires min guard assist for ADLs.  Reinterated need to use SPC at ALL times and discussed high risk of falls with pt  Follow Up Recommendations  Home health OT;Supervision/Assistance - 24 hour    Equipment Recommendations  None recommended by OT    Recommendations for Other Services      Precautions / Restrictions Precautions Precautions: Fall Restrictions Weight Bearing Restrictions: No       Mobility Bed Mobility Overal bed mobility: Modified Independent                Transfers Overall transfer level: Needs assistance Equipment used: Straight cane Transfers: Sit to/from Stand Sit to Stand: Supervision         General transfer comment: Verbal cues for safe use of cane.  No physical assist needed.    Balance Overall balance assessment: Needs assistance         Standing balance support: No upper extremity supported Standing balance-Leahy Scale: Fair                             ADL either performed or assessed with clinical judgement   ADL Overall ADL's : Needs assistance/impaired                     Lower Body Dressing: Min guard;Sit to/from stand   Toilet Transfer: Min guard;Ambulation;Comfort height toilet;RW;Grab bars                   Vision       Perception     Praxis      Cognition Arousal/Alertness: Awake/alert Behavior During Therapy: WFL for tasks assessed/performed;Flat affect Overall Cognitive Status: Impaired/Different from baseline Area of Impairment:  Attention;Memory;Safety/judgement;Awareness                   Current Attention Level: Selective Memory: Decreased short-term memory   Safety/Judgement: Decreased awareness of safety Awareness: Emergent Problem Solving: Slow processing General Comments: performed the visuo spatial portion and memory portion of MOCA.  Pt unable to copy a cube or draw a clock face with the numbers in the proper orientation.   He was able to recall 3/5 words after 10 seconds, but not after a minute        Exercises     Shoulder Instructions       General Comments reinforced need to use cane at home at ALL times.  Pt initially states he doesn't want to depend on it, so will use it when necessary.  Discussed high fall risk and need to use it at all times, unless HHPT instructs him otherwise     Pertinent Vitals/ Pain       Pain Assessment: No/denies pain  Home Living                                          Prior Functioning/Environment  Frequency  Min 2X/week        Progress Toward Goals  OT Goals(current goals can now be found in the care plan section)  Progress towards OT goals: Progressing toward goals  Acute Rehab OT Goals Patient Stated Goal: to get better   Plan Discharge plan remains appropriate    Co-evaluation                 End of Session    OT Visit Diagnosis: Unsteadiness on feet (R26.81)   Activity Tolerance     Patient Left     Nurse Communication          Time: 4097-3532 OT Time Calculation (min): 20 min  Charges: OT General Charges $OT Visit: 1 Procedure OT Treatments $Self Care/Home Management : 8-22 mins  [facility], OTR/L [phone]    Lucille Passy M 06/15/2016, 1:18 PM

## 2016-06-15 NOTE — Progress Notes (Signed)
[facility] Progress Note   Subjective:  Seen in room, feeling well. No new symptoms. No CP or dyspnea. Worked with PT yesterday which was helpful. Denies visible blood in stool, says they are intermittently "dark".  Objective Vitals:   06/14/16 1800 06/14/16 2155 06/15/16 0211 06/15/16 0600  BP: (!) 165/85 (!) 173/76 (!) 174/82 (!) 167/78  Pulse: 93 97 99 95  Resp: 16 18 20 20   Temp: 97.8 F (36.6 C) 99.1 F (37.3 C) 98.7 F (37.1 C) 99 F (37.2 C)  TempSrc: Oral Oral Oral Oral  SpO2: 100% 100% 100% 96%  Weight:      Height:       Physical Exam General: Well appearing, NAD. Heart: RRR; no murmur. Lungs: CTAB Extremities: No LE edema Dialysis Access: TDC in R chest, maturing L forearm AVF + thrill/bruit.  Additional Objective Labs: Basic Metabolic Panel:  Recent Labs Lab 06/13/16 1201 06/13/16 1222 06/14/16 0358  NA 140 142 141  K 3.5 3.8 3.8  CL 101 100* 102  CO2 29  --  28  GLUCOSE 124* 124* 101*  BUN 14 19 26*  CREATININE 3.88* 3.70* 5.55*  CALCIUM 8.0*  --  7.8*   Liver Function Tests:  Recent Labs Lab 06/13/16 1201  AST 16  ALT 10*  ALKPHOS 104  BILITOT 0.7  PROT 6.5  ALBUMIN 3.2*   CBC:  Recent Labs Lab 06/13/16 1201 06/13/16 1222 06/14/16 0358 06/15/16 0342  WBC 6.5  --  8.3 7.1  NEUTROABS 5.2  --   --   --   HGB 6.8* 7.8* 7.2* 7.7*  HCT 21.2* 23.0* 21.8* 22.6*  MCV 94.2  --  91.6 90.8  PLT 231  --  226 253   CBG:  Recent Labs Lab 06/13/16 1203 06/13/16 2155 06/14/16 0703  GLUCAP 113* 200* 86   Studies/Results: Dg Chest 2 View  Result Date: 06/13/2016 CLINICAL DATA:  Initial evaluation for acute stroke. EXAM: CHEST  2 VIEW COMPARISON:  Prior radiograph from 04/30/2016. FINDINGS: Dual lumen right-sided central venous catheter in place with tip overlying the cavoatrial junction. Cardiac and mediastinal silhouettes are stable, and remain within normal limits. Mild aortic atherosclerosis. Lungs normally inflated.  No focal infiltrate, pulmonary edema, or pleural effusion. No pneumothorax. No acute osseous abnormality. IMPRESSION: 1. No active cardiopulmonary disease. 2. Mild aortic atherosclerosis. Electronically Signed   By: Jeannine Boga M.D.   On: 06/13/2016 18:10   [name] Contrast (neuro Protocol)  Result Date: 06/13/2016 CLINICAL DATA:  Right leg weakness.  Groin pain. EXAM: MRI HEAD WITHOUT CONTRAST TECHNIQUE: Multiplanar, multiecho pulse sequences of the brain and surrounding structures were obtained without intravenous contrast. COMPARISON:  Head CT 06/13/2016 and MRI 04/04/2015 FINDINGS: Brain: There are 2 subcentimeter foci of acute infarction involving the lateral margin of the right thalamus/adjacent deep white matter. There is moderate cerebral atrophy. Patchy to confluent T2 hyperintensities in the subcortical and deep cerebral white matter and pons have mildly progressed from the prior MRI and are compatible with moderate chronic small vessel ischemic disease. Chronic lacunar infarcts are present in the basal ganglia, thalami, and deep cerebral white matter bilaterally. Numerous foci of chronic microhemorrhage are again seen in the deep gray nuclei and bilateral cerebral and cerebellar hemispheres, mildly increased in number from the prior MRI and likely reflecting chronic hypertension. No mass, midline shift, or extra-axial fluid collection is seen. Vascular: Major intracranial vascular flow voids are preserved. Skull and upper cervical spine: Heterogeneously diminished bone marrow  signal intensity in the skull and visualized cervical spine likely reflecting chronic renal failure and anemia. Sinuses/Orbits: Unremarkable orbits. Paranasal sinuses and mastoid air cells are clear. Other: None. IMPRESSION: 1. Punctate acute infarcts along the lateral margin of the right thalamus. 2. Moderate chronic small vessel ischemic disease. Electronically Signed   By: Logan Bores M.D.   On: 06/13/2016 16:57    Mr Jodene Nam Head/brain [place] Cm  Result Date: 06/13/2016 CLINICAL DATA:  Initial evaluation for acute stroke. EXAM: MRA HEAD WITHOUT CONTRAST TECHNIQUE: Angiographic images of the Circle of Willis were obtained using MRA technique without intravenous contrast. COMPARISON:  Prior MRI from earlier same day. FINDINGS: ANTERIOR CIRCULATION: Distal cervical segments of the internal carotid arteries are patent with antegrade flow. Petrous segments widely patent bilaterally. Atheromatous irregularity throughout the cavernous ICAs without flow-limiting stenosis. Changes slightly worse on the left were there is mild to moderate narrowing at the anterior genu. ICA termini widely patent. A1 segments patent bilaterally without stenosis. Anterior communicating artery normal. Anterior cerebral arteries demonstrate mild atheromatous irregularity without flow-limiting stenosis. PCAs are patent to their distal aspects. Mild atheromatous irregularity within the M1 segments without flow-limiting stenosis. MCA bifurcations within normal limits. No proximal M2 occlusion. Distal MCA branches well opacified and symmetric. POSTERIOR CIRCULATION: Vertebral arteries patent to the vertebrobasilar junction. Posterior inferior cerebral arteries patent bilaterally. Basilar artery widely patent. Superior cerebellar arteries patent bilaterally. Both of the posterior cerebral artery is predominantly supplied via the basilar and are widely patent to their distal aspects. Small right posterior communicating artery noted. No aneurysm or vascular malformation. IMPRESSION: Mild atherosclerotic changes involving the intracranial circulation as above. No large vessel occlusion identified. No high-grade or correctable stenosis. Electronically Signed   By: Jeannine Boga M.D.   On: 06/13/2016 21:04   Ct Head Code Stroke W/o Cm  Result Date: 06/13/2016 CLINICAL DATA:  Code stroke. Right-sided weakness. Hypertension, diabetes, dialysis EXAM: CT HEAD  WITHOUT CONTRAST TECHNIQUE: Contiguous axial images were obtained from the base of the skull through the vertex without intravenous contrast. COMPARISON:  CT head 06/13/2016 FINDINGS: Brain: Negative for acute infarct.  Negative for hemorrhage or mass Mild atrophy. Chronic microvascular ischemic change in the white matter. Chronic infarcts in the basal ganglia bilaterally including the left internal capsule anteriorly. Bilateral putamen infarcts. Vascular: No hyperdense vessel or unexpected calcification. Skull: Negative Sinuses/Orbits: Negative Other: None ASPECTS ([place] Stroke Program Early CT Score) - Ganglionic level infarction (caudate, lentiform nuclei, internal capsule, insula, M1-M3 cortex): 7 - Supraganglionic infarction (M4-M6 cortex): 3 Total score (0-10 with 10 being normal): 10 IMPRESSION: 1. No acute intracranial abnormality 2. ASPECTS is 10 3. Moderate chronic ischemic changes in the white matter and basal ganglia bilaterally. These results were called by telephone at the time of interpretation on 06/13/2016 at 12:36 pm to Dr. [name], who verbally acknowledged these results. Electronically Signed   By: Franchot Gallo M.D.   On: 06/13/2016 12:36   Medications:  . carvedilol  3.125 mg Oral BID WC  . clopidogrel  75 mg Oral Daily  . enoxaparin (LOVENOX) injection  30 mg Subcutaneous Q24H  . rosuvastatin  40 mg Oral Daily    Dialysis Orders: MWF at [facility]) 3:30, F200 dialyzer, 2K/2.5Ca, BFR400/DFR800, TDC, EDW 140lb (63.6kg) * Records review Hgb 6.2 on 05/21/16, tsat 27% with ferritin 371 on 3/7. - Aranesp 200 being given weekly, last 3/28. - Hectoral 2.43mcg IV q HD - Parsabiv 2.69mcg IV q HD  Overview: [name] is  a 72 y.o. male with ESRD, CAD, HTN, Type 2 DM, Hx CVA who was admitted with RLE weakness, later found to have acute punctate R thalamic CVA on MRI.  Assessment/Plan: 1.  Acute CVA: Slurred speech resolved, still with R leg paresthesias (sensation  and strength reasonably intact). Secondary work-up ongoing per primary (MRA WNL, LDL 69). On plavix. 2.  ESRD: Usually MWF schedule. Last HD was cut short, and he was given 1U PRBCs on 3/30, but BP/volume and labs remain ok. He has no symptoms of volume overload at this time. Next HD on 4/2. 3.  Hypertension/volume: BP slightly high. No symptoms of volume overload, CXR 3/30 clear. Will remove fluid with next HD. 4.  Anemia: S/p 1 U PRBCs 3/30 for Hgb 6.8, Hgb 7.7 today. Not due to ESA yet. If Hgb drops below 7 again, will plan to transfuse with HD. Would check stool guaiacs given Hx GIB. 5.  Metabolic bone disease: Ca ok, Phos pending. Not on binders. Last outpt PTH 347. 6.  Nutrition: Alb low, continue Nepro. 7.  CAD: Per primary.  Veneta Penton, PA-C 06/15/2016, 10:01 AM  [facility] Pager: [phone]  I have seen and examined this patient and agree with plan and assessment in the above note with renal recommendations/intervention highlighted. Next HD for tomorrow.  Needs stool hemoccults done. If Hb <7, will transfuse with HD. Stroke evaluation ongoing, neurologically improved but w/residual R leg paresthesias.  Shalah Estelle B,MD 06/15/2016 11:40 AM

## 2016-06-15 NOTE — Care Management Note (Signed)
Case Management Note  Patient Details  Name: [name] MRN: [id number] Date of Birth: 01-Jun-1945  Subjective/Objective:   Punctate acute infarcts along the lateral margin of the Rt Thalamus               Action/Plan: Discharge Planning: NCM spoke to pt and wife. Offered choice for [facility]. Wife requested [facility] for [facility]. States pt has cane at home.   PCP [name] MD   Expected Discharge Date:  06/15/16               Expected Discharge Plan:  [place]  In-House Referral:  NA  Discharge planning Services  CM Consult  Post Acute Care Choice:  Home Health Choice offered to:  Spouse  DME Arranged:  N/A DME Agency:  NA  HH Arranged:  PT, OT, RN [place] Agency:  [place]  Status of Service:  Completed, signed off  If discussed at [place] of Stay Meetings, dates discussed:    Additional Comments:  Erenest Rasher, RN 06/15/2016, 1:11 PM

## 2016-06-16 ENCOUNTER — Telehealth: Payer: Self-pay | Admitting: Behavioral Health

## 2016-06-16 LAB — HEMOGLOBIN A1C
Hgb A1c MFr Bld: 4.9 % (ref 4.8–5.6)
Mean Plasma Glucose: 94 mg/dL

## 2016-06-16 LAB — VAS US CAROTID
LCCADDIAS: -21 cm/s
LCCADSYS: -83 cm/s
LCCAPDIAS: 19 cm/s
LCCAPSYS: 80 cm/s
LEFT ECA DIAS: -16 cm/s
LEFT VERTEBRAL DIAS: -12 cm/s
LICADDIAS: -16 cm/s
Left ICA dist sys: -82 cm/s
Left ICA prox dias: -25 cm/s
Left ICA prox sys: -97 cm/s
RCCADSYS: -117 cm/s
RCCAPDIAS: 21 cm/s
RIGHT ECA DIAS: -14 cm/s
RIGHT VERTEBRAL DIAS: -22 cm/s
Right CCA prox sys: 78 cm/s

## 2016-06-16 NOTE — Telephone Encounter (Signed)
Unable to reach patient for TCM/Hospital Follow-up call. Left message for patient to return call when available.    

## 2016-06-17 ENCOUNTER — Encounter: Payer: Self-pay | Admitting: Medical

## 2016-06-18 NOTE — Telephone Encounter (Signed)
Transition Care Management Follow-up Telephone Call  PCP: [name], NP  Admit date: 06/13/2016 Discharge date: 06/15/2016  Admitted From:dialysis unit Disposition:home  Recommendations for Outpatient Follow-up:  1. Follow up with PCP in 1-2 weeks 2. Please obtain BMP/CBC in one week  Home Health:yes Equipment/Devices:cane Discharge Condition:stable   How have you been since you were released from the hospital? Patient stated, "I'm doing pretty good".   Do you understand why you were in the hospital? yes, patient voiced that he had a stroke.   Do you understand the discharge instructions? yes   Where were you discharged to? Home   Items Reviewed:  Medications reviewed: yes  Allergies reviewed: yes  Dietary changes reviewed: yes, heart healthy diet  Referrals reviewed: yes, Follow up with PCP in 1-2 weeks    Functional Questionnaire:   Activities of Daily Living (ADLs):   He states they are independent in the following: ambulation, bathing and hygiene, feeding, continence, grooming, toileting and dressing States they require assistance with the following: None    Any transportation issues/concerns?: no   Any patient concerns? no   Confirmed importance and date/time of follow-up visits scheduled yes, 06/19/16 at 9:30 AM.  Provider Appointment booked with Mackie Pai, PA-C.  Confirmed with patient if condition begins to worsen call PCP or go to the ER.  Patient was given the office number and encouraged to call back with question or concerns.  : yes

## 2016-06-19 ENCOUNTER — Ambulatory Visit (INDEPENDENT_AMBULATORY_CARE_PROVIDER_SITE_OTHER): Payer: Medicare Other | Admitting: Medical

## 2016-06-19 ENCOUNTER — Telehealth: Payer: Self-pay | Admitting: *Deleted

## 2016-06-19 ENCOUNTER — Encounter: Payer: Self-pay | Admitting: Medical

## 2016-06-19 VITALS — BP 145/70 | HR 96 | Temp 98.1°F | Resp 16 | Ht 67.0 in | Wt 138.8 lb

## 2016-06-19 DIAGNOSIS — I10 Essential (primary) hypertension: Secondary | ICD-10-CM

## 2016-06-19 DIAGNOSIS — N189 Chronic kidney disease, unspecified: Secondary | ICD-10-CM | POA: Diagnosis not present

## 2016-06-19 DIAGNOSIS — D649 Anemia, unspecified: Secondary | ICD-10-CM

## 2016-06-19 DIAGNOSIS — R195 Other fecal abnormalities: Secondary | ICD-10-CM

## 2016-06-19 DIAGNOSIS — I69898 Other sequelae of other cerebrovascular disease: Secondary | ICD-10-CM

## 2016-06-19 DIAGNOSIS — Z8673 Personal history of transient ischemic attack (TIA), and cerebral infarction without residual deficits: Secondary | ICD-10-CM

## 2016-06-19 DIAGNOSIS — R5383 Other fatigue: Secondary | ICD-10-CM | POA: Diagnosis not present

## 2016-06-19 LAB — COMPREHENSIVE METABOLIC PANEL
ALBUMIN: 3.5 g/dL (ref 3.5–5.2)
ALK PHOS: 112 U/L (ref 39–117)
ALT: 11 U/L (ref 0–53)
AST: 17 U/L (ref 0–37)
BUN: 16 mg/dL (ref 6–23)
CALCIUM: 8 mg/dL — AB (ref 8.4–10.5)
CO2: 31 mEq/L (ref 19–32)
CREATININE: 5.36 mg/dL — AB (ref 0.40–1.50)
Chloride: 102 mEq/L (ref 96–112)
GFR: 13.7 mL/min — CL (ref >60.00)
Glucose, Bld: 152 mg/dL — ABNORMAL HIGH (ref 70–99)
POTASSIUM: 3.9 meq/L (ref 3.5–5.1)
SODIUM: 139 meq/L (ref 135–145)
TOTAL PROTEIN: 6.9 g/dL (ref 6.0–8.3)
Total Bilirubin: 0.3 mg/dL (ref 0.2–1.2)

## 2016-06-19 LAB — CBC WITH DIFFERENTIAL/PLATELET
Basophils Absolute: 0.1 10*3/uL (ref 0.0–0.1)
Basophils Relative: 1.5 % (ref 0.0–3.0)
Eosinophils Absolute: 0.2 10*3/uL (ref 0.0–0.7)
Eosinophils Relative: 2.7 % (ref 0.0–5.0)
HCT: 24.6 % — ABNORMAL LOW (ref 39.0–52.0)
Hemoglobin: 8.4 g/dL — ABNORMAL LOW (ref 13.0–17.0)
Lymphocytes Relative: 10.4 % — ABNORMAL LOW (ref 12.0–46.0)
Lymphs Abs: 0.6 10*3/uL — ABNORMAL LOW (ref 0.7–4.0)
MCHC: 34 g/dL (ref 30.0–36.0)
MCV: 94.4 fl (ref 78.0–100.0)
Monocytes Absolute: 0.7 10*3/uL (ref 0.1–1.0)
Monocytes Relative: 11.9 % (ref 3.0–12.0)
Neutro Abs: 4.3 10*3/uL (ref 1.4–7.7)
Neutrophils Relative %: 73.5 % (ref 43.0–77.0)
Platelets: 317 10*3/uL (ref 150.0–400.0)
RBC: 2.61 Mil/uL — ABNORMAL LOW (ref 4.22–5.81)
RDW: 18.2 % — ABNORMAL HIGH (ref 11.5–15.5)
WBC: 5.9 10*3/uL (ref 4.0–10.5)

## 2016-06-19 MED ORDER — CARVEDILOL 3.125 MG PO TABS: 3.1250 mg | ORAL_TABLET | Freq: Two times a day (BID) | ORAL | 0 refills | Status: DC

## 2016-06-19 MED ORDER — GABAPENTIN 300 MG PO CAPS: 300.0000 mg | ORAL_CAPSULE | Freq: Three times a day (TID) | ORAL | 3 refills | Status: DC

## 2016-06-19 MED ORDER — ROSUVASTATIN CALCIUM 40 MG PO TABS: 40.0000 mg | ORAL_TABLET | Freq: Every day | ORAL | 0 refills | Status: DC

## 2016-06-19 MED ORDER — CLOPIDOGREL BISULFATE 75 MG PO TABS: 75.0000 mg | ORAL_TABLET | Freq: Every day | ORAL | 0 refills | Status: DC

## 2016-06-19 MED ORDER — OMEPRAZOLE 40 MG PO CPDR: 40.0000 mg | DELAYED_RELEASE_CAPSULE | Freq: Every day | ORAL | 3 refills | Status: DC

## 2016-06-19 NOTE — Telephone Encounter (Signed)
Gi referral placed  

## 2016-06-19 NOTE — Progress Notes (Signed)
Pre visit review using our clinic review tool, if applicable. No additional management support is needed unless otherwise documented below in the visit note. 

## 2016-06-19 NOTE — Progress Notes (Signed)
Subjective:    Patient ID: MCCABE GLORIA, male    DOB: Nov 06, 1945, 71 y.o.   MRN: [id number]  HPI  Pt was at dialysis before admission recently. He had weakess and slurred speech. Then ems was called.  Pt in ED was found to have stroke right thalamus. Pt did gain some strength post hospitalization. But still has some residual weakness. Pt has OT arranged to come to his house next thursday. Pt is on statin plavix an coreg. Pt not sure when his follow with neurologist is. He thinks may on 10 April.  Pt also found to be anemic. They transfused pt with one unit. RBC. Anemia was thought to related to anemia of chronic disease. No black stools. No blood in his stools.  Pt went to dialysis yesterday.(he goes on Monday Wed, and Friday.  Pt states yesterday he ha loose stools went about 3-4 times. Pt was not discharged on any antibiotic. No loose stools today.      Review of Systems  Constitutional: Negative for chills, fatigue and fever.  HENT: Negative for congestion, sinus pressure, sneezing and sore throat.   Respiratory: Negative for cough, chest tightness and shortness of breath.   Cardiovascular: Negative for chest pain and palpitations.  Gastrointestinal: Positive for abdominal pain. Negative for abdominal distention, blood in stool, constipation, diarrhea, nausea, rectal pain and vomiting.       Faint low level epigastric pain.  Genitourinary: Negative for decreased urine volume, dysuria, enuresis, frequency, genital sores and testicular pain.       Rt groin lower inguinal region faint pain.  Musculoskeletal: Negative for back pain.  Neurological: Negative for dizziness, syncope and headaches.       Rt lower ext feels faint weak.  Psychiatric/Behavioral: Negative for behavioral problems and confusion.   Past Medical History:  Diagnosis Date  . 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 Anemia, iron deficiency 03/24/2011   a. Recurrent GI bleed, tx with periodic iron infusions.  .  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 AVM (arteriovenous malformation) of duodenum, acquired    egds in 01/2012, 04/2011  . BPH (benign prostatic hyperplasia)   . CAD (coronary artery disease)    a. Cath 09/2012: moderate borderline CAD in mid LAD/small diagonal branch, mild RCA stenosis, to be managed medically   . Chronic lower back pain   . Diabetic peripheral neuropathy ([place]) 2014   foot pain.  . Fatty liver    on CT of 11/2010  . GERD (gastroesophageal reflux disease)   . GI bleed    a. Recurrent GI bleed, tx with IV iron. b. Per heme notes - likely AVMs 04/2012 (tx with cauterization several months ago).  . Hematuria    a. Urology note scan from 07/2012: cystoscopy without evidence for bladder lesion, only lateral hypertrophy of posterior urethra, bladder impression from BPH. b. Pt states he had "some tests" scheduled for later in July 2014.  . High cholesterol   . Hypertension   . 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 Orthostatic hypotension    a. Tx with florinef.  . Peripheral neuropathy ([place])   . Polyp, colonic    Colonoscopy 01/2012 "benign" polyp  . Prostate cancer ([place])   . Sleep apnea    "had mask; couldn't sleep in it" (09/20/2012)  . Smoker   . Stage III chronic kidney disease    a. Stage 3 (DM with complications ->CKD, peripheral neuropathy).  . Stroke ([place])    x 2  .  Type II diabetes mellitus ([place])    a. Dx 1994, uncontrolled.      Social History   Social History  . Marital status: Married    Spouse name: N/A  . Number of children: 2  . Years of education: N/A   Occupational History  . taken out of work due to back    Social History Main Topics  . Smoking status: Current Some Day Smoker    Packs/day: 1.00    Years: 43.00    Types: Cigarettes    Start date: 04/15/1968  . Smokeless tobacco: Never Used     Comment: 07-22--2016  still smoking  . Alcohol use No     Comment: 09/20/2012 "Used to; stopped ~ 2009; never had problem  w/it"  . Drug use: No  . Sexual activity: No   Other Topics Concern  . Not on file   Social History Narrative   Regular exercise: rides horses   Caffeine use: occasionally    Past Surgical History:  Procedure Laterality Date  . ENTEROSCOPY N/A 03/01/2013   Procedure: ENTEROSCOPY;  Surgeon: Inda Castle, MD;  Location: [place];  Service: Endoscopy;  Laterality: N/A;  . GIVENS CAPSULE STUDY N/A 04/05/2015   Procedure: GIVENS CAPSULE STUDY;  Surgeon: Gatha Mayer, MD;  Location: [facility];  Service: Endoscopy;  Laterality: N/A;  . INGUINAL HERNIA REPAIR Right 2011  . LEFT HEART CATHETERIZATION WITH CORONARY ANGIOGRAM N/A 09/21/2012   Procedure: LEFT HEART CATHETERIZATION WITH CORONARY ANGIOGRAM;  Surgeon: Peter M Martinique, MD;  Location: [facility] CATH LAB;  Service: Cardiovascular;  Laterality: N/A;  . LUMBAR DISC SURGERY  1980's  . LYMPHADENECTOMY Bilateral 01/19/2013   Procedure: LYMPHADENECTOMY;  Surgeon: Bernestine Amass, MD;  Location: [facility];  Service: Urology;  Laterality: Bilateral;  . ROBOT ASSISTED LAPAROSCOPIC RADICAL PROSTATECTOMY N/A 01/19/2013   Procedure: ROBOTIC ASSISTED LAPAROSCOPIC RADICAL PROSTATECTOMY;  Surgeon: Bernestine Amass, MD;  Location: [facility];  Service: Urology;  Laterality: N/A;  . SHOULDER OPEN ROTATOR CUFF REPAIR Left 1980's    Family History  Problem Relation Age of Onset  . Stroke Father     Died at 64  . Diabetes Mother   . Heart attack Brother     Died at 44  . Diabetes Sister   . Stomach cancer Brother   . Heart attack Sister     No Known Allergies  Current Outpatient Prescriptions on File Prior to Visit  Medication Sig Dispense Refill  . benzonatate (TESSALON) 100 MG capsule Take 1 capsule (100 mg total) by mouth 3 (three) times daily as needed for cough. 21 capsule 0   No current facility-administered medications on file prior to visit.     BP (!) 145/70   Pulse 96   Temp 98.1 F (36.7 C) (Oral)   Resp 16   Ht 5\' 7"  (1.702 m)    Wt 138 lb 12.8 oz (63 kg)   SpO2 100%   BMI 21.74 kg/m       Objective:   Physical Exam  General Mental Status- Alert. General Appearance- Not in acute distress.   Skin General: Color- Normal Color. Moisture- Normal Moisture.  Neck Carotid Arteries- Normal color. Moisture- Normal Moisture. No carotid bruits. No JVD.  Chest and Lung Exam Auscultation: Breath Sounds:-Normal.  Cardiovascular Auscultation:Rythm- Regular. Murmurs & Other Heart Sounds:Auscultation of the heart reveals- No Murmurs.  Abdomen Inspection:-Inspeection Normal. Palpation/Percussion:Note:No mass. Palpation and Percussion of the abdomen reveal- faint epigastric  Tender, Non Distended + BS,  no rebound or guarding.    Neurologic Cranial Nerve exam:- CN III-XII intact(No nystagmus), symmetric smile. Strength:- 5/5 equal and symmetric strength both upper extremities. Rt lower ext 4/5 compared to 5/5 lt side(almost the same)  Genital exam- tender rt inguinal canal. No hernia. Left side suprapubic bulge. When he coughs size increases.      Assessment & Plan:  For your stroke and persisting reported rt lower ext weakness you will start OT next week.  I am going to refer to neurologist and confirm you have appointment sometime in near future as your report.  Your recent anemia is likely related to anemia of chronic disease but hb/hct has gone down over past 4 months and your stool card was positive for blood. So I do think good idea to refer you to GI. Will get cbc and cmp stat today. Will refer to specialist quickly. But if significant drop in hb/hct will refer ASAP. Might even have to refer to ED if hb/hct dropped a lot.  Follow up this coming Monday or as needed  Did counsel with him small hernia and we could refer him to surgeon. But risk of surgery likely out weight benefit.

## 2016-06-19 NOTE — Telephone Encounter (Signed)
CRITICAL VALUE STICKER  CRITICAL VALUE:Creatinine:5.36                                GFR:13.7  RECEIVER (on-site recipient of call):Le Fer[place]  DATE & TIME NOTIFIED:06/19/16 @ 1:17pm  MESSENGER (representative from lab):[facility]  MD NOTIFIED:Edward Saguier,PA-C  TIME OF NOTIFICATION:1:25pm  RESPONSE:Edward stated that the patient was on Dialysis.

## 2016-06-19 NOTE — Patient Instructions (Addendum)
For your stroke and persisting reported rt lower ext weakness you will start OT next week.  I am going to refer to neurologist and confirm you have appointment sometime in near future as your report.  Your recent anemia is likely related to anemia of chronic disease but hb/hct has gone down over past 4 months and your stool card was positive for blood. So I do think good idea to refer you to GI. Will get cbc and cmp stat today. Will refer to specialist quickly. But if significant drop in hb/hct will refer ASAP. Might even have to refer to ED if hb/hct dropped a lot.  Follow up this coming Monday or as needed  We are approaching the weekend so if you feel worse in light of recent hospitalization then would recommend ED evaluation to check your status

## 2016-06-21 NOTE — Telephone Encounter (Signed)
Can you get pt into GI this coming week.

## 2016-06-23 ENCOUNTER — Inpatient Hospital Stay (HOSPITAL_COMMUNITY)
Admission: EM | Admit: 2016-06-23 | Discharge: 2016-06-26 | DRG: 344 | Disposition: A | Discharge status: Home Health | Payer: Medicare Other | Attending: Nephrology | Admitting: Nephrology

## 2016-06-23 ENCOUNTER — Telehealth: Payer: Self-pay | Admitting: Neurology

## 2016-06-23 ENCOUNTER — Emergency Department (HOSPITAL_COMMUNITY): Payer: Medicare Other

## 2016-06-23 ENCOUNTER — Other Ambulatory Visit: Payer: Self-pay

## 2016-06-23 DIAGNOSIS — Z8673 Personal history of transient ischemic attack (TIA), and cerebral infarction without residual deficits: Secondary | ICD-10-CM

## 2016-06-23 DIAGNOSIS — K760 Fatty (change of) liver, not elsewhere classified: Secondary | ICD-10-CM | POA: Diagnosis present

## 2016-06-23 DIAGNOSIS — E7800 Pure hypercholesterolemia, unspecified: Secondary | ICD-10-CM | POA: Diagnosis present

## 2016-06-23 DIAGNOSIS — Z992 Dependence on renal dialysis: Secondary | ICD-10-CM

## 2016-06-23 DIAGNOSIS — D631 Anemia in chronic kidney disease: Secondary | ICD-10-CM

## 2016-06-23 DIAGNOSIS — Z8546 Personal history of malignant neoplasm of prostate: Secondary | ICD-10-CM

## 2016-06-23 DIAGNOSIS — K5521 Angiodysplasia of colon with hemorrhage: Secondary | ICD-10-CM | POA: Diagnosis not present

## 2016-06-23 DIAGNOSIS — Z7902 Long term (current) use of antithrombotics/antiplatelets: Secondary | ICD-10-CM

## 2016-06-23 DIAGNOSIS — E785 Hyperlipidemia, unspecified: Secondary | ICD-10-CM | POA: Diagnosis present

## 2016-06-23 DIAGNOSIS — K219 Gastro-esophageal reflux disease without esophagitis: Secondary | ICD-10-CM | POA: Diagnosis present

## 2016-06-23 DIAGNOSIS — Z7982 Long term (current) use of aspirin: Secondary | ICD-10-CM

## 2016-06-23 DIAGNOSIS — M545 Low back pain: Secondary | ICD-10-CM | POA: Diagnosis present

## 2016-06-23 DIAGNOSIS — Z800 Family history of malignant neoplasm of digestive organs: Secondary | ICD-10-CM

## 2016-06-23 DIAGNOSIS — N186 End stage renal disease: Secondary | ICD-10-CM | POA: Diagnosis present

## 2016-06-23 DIAGNOSIS — D649 Anemia, unspecified: Secondary | ICD-10-CM | POA: Diagnosis present

## 2016-06-23 DIAGNOSIS — E11319 Type 2 diabetes mellitus with unspecified diabetic retinopathy without macular edema: Secondary | ICD-10-CM | POA: Diagnosis present

## 2016-06-23 DIAGNOSIS — G4730 Sleep apnea, unspecified: Secondary | ICD-10-CM | POA: Diagnosis present

## 2016-06-23 DIAGNOSIS — E1140 Type 2 diabetes mellitus with diabetic neuropathy, unspecified: Secondary | ICD-10-CM | POA: Diagnosis present

## 2016-06-23 DIAGNOSIS — E1165 Type 2 diabetes mellitus with hyperglycemia: Secondary | ICD-10-CM | POA: Diagnosis present

## 2016-06-23 DIAGNOSIS — N2581 Secondary hyperparathyroidism of renal origin: Secondary | ICD-10-CM | POA: Diagnosis present

## 2016-06-23 DIAGNOSIS — R531 Weakness: Secondary | ICD-10-CM | POA: Diagnosis not present

## 2016-06-23 DIAGNOSIS — Z8249 Family history of ischemic heart disease and other diseases of the circulatory system: Secondary | ICD-10-CM

## 2016-06-23 DIAGNOSIS — D62 Acute posthemorrhagic anemia: Secondary | ICD-10-CM | POA: Diagnosis present

## 2016-06-23 DIAGNOSIS — E1142 Type 2 diabetes mellitus with diabetic polyneuropathy: Secondary | ICD-10-CM | POA: Diagnosis present

## 2016-06-23 DIAGNOSIS — E1122 Type 2 diabetes mellitus with diabetic chronic kidney disease: Secondary | ICD-10-CM | POA: Diagnosis present

## 2016-06-23 DIAGNOSIS — G8929 Other chronic pain: Secondary | ICD-10-CM | POA: Diagnosis present

## 2016-06-23 DIAGNOSIS — Z833 Family history of diabetes mellitus: Secondary | ICD-10-CM

## 2016-06-23 DIAGNOSIS — K631 Perforation of intestine (nontraumatic): Secondary | ICD-10-CM | POA: Diagnosis present

## 2016-06-23 DIAGNOSIS — I10 Essential (primary) hypertension: Secondary | ICD-10-CM | POA: Diagnosis present

## 2016-06-23 DIAGNOSIS — F17210 Nicotine dependence, cigarettes, uncomplicated: Secondary | ICD-10-CM | POA: Diagnosis present

## 2016-06-23 DIAGNOSIS — K921 Melena: Secondary | ICD-10-CM | POA: Diagnosis present

## 2016-06-23 DIAGNOSIS — I2510 Atherosclerotic heart disease of native coronary artery without angina pectoris: Secondary | ICD-10-CM | POA: Diagnosis present

## 2016-06-23 DIAGNOSIS — Z823 Family history of stroke: Secondary | ICD-10-CM

## 2016-06-23 DIAGNOSIS — IMO0002 Reserved for concepts with insufficient information to code with codable children: Secondary | ICD-10-CM | POA: Diagnosis present

## 2016-06-23 DIAGNOSIS — I120 Hypertensive chronic kidney disease with stage 5 chronic kidney disease or end stage renal disease: Secondary | ICD-10-CM | POA: Diagnosis present

## 2016-06-23 LAB — POC OCCULT BLOOD, ED: Fecal Occult Bld: POSITIVE — AB

## 2016-06-23 LAB — I-STAT CHEM 8, ED
BUN: 31 mg/dL — ABNORMAL HIGH (ref 6–20)
CALCIUM ION: 0.96 mmol/L — AB (ref 1.15–1.40)
CHLORIDE: 101 mmol/L (ref 101–111)
Creatinine, Ser: 4.6 mg/dL — ABNORMAL HIGH (ref 0.61–1.24)
GLUCOSE: 158 mg/dL — AB (ref 65–99)
HEMATOCRIT: 15 % — AB (ref 39.0–52.0)
HEMOGLOBIN: 5.1 g/dL — AB (ref 13.0–17.0)
Potassium: 3.6 mmol/L (ref 3.5–5.1)
SODIUM: 141 mmol/L (ref 135–145)
TCO2: 30 mmol/L (ref 0–100)

## 2016-06-23 LAB — APTT: APTT: 57 s — AB (ref 24–36)

## 2016-06-23 LAB — DIFFERENTIAL
BASOS ABS: 0 10*3/uL (ref 0.0–0.1)
BASOS PCT: 1 %
EOS ABS: 0.2 10*3/uL (ref 0.0–0.7)
EOS PCT: 4 %
Lymphocytes Relative: 17 %
Lymphs Abs: 0.9 10*3/uL (ref 0.7–4.0)
MONOS PCT: 10 %
Monocytes Absolute: 0.5 10*3/uL (ref 0.1–1.0)
Neutro Abs: 3.7 10*3/uL (ref 1.7–7.7)
Neutrophils Relative %: 68 %

## 2016-06-23 LAB — CBC
HEMATOCRIT: 15 % — AB (ref 39.0–52.0)
Hemoglobin: 4.9 g/dL — CL (ref 13.0–17.0)
MCH: 30.4 pg (ref 26.0–34.0)
MCHC: 32.7 g/dL (ref 30.0–36.0)
MCV: 93.2 fL (ref 78.0–100.0)
Platelets: 246 10*3/uL (ref 150–400)
RBC: 1.61 MIL/uL — ABNORMAL LOW (ref 4.22–5.81)
RDW: 16.7 % — AB (ref 11.5–15.5)
WBC: 5.4 10*3/uL (ref 4.0–10.5)

## 2016-06-23 LAB — COMPREHENSIVE METABOLIC PANEL
ALT: 13 U/L — ABNORMAL LOW (ref 17–63)
ANION GAP: 7 (ref 5–15)
AST: 17 U/L (ref 15–41)
Albumin: 2.9 g/dL — ABNORMAL LOW (ref 3.5–5.0)
Alkaline Phosphatase: 94 U/L (ref 38–126)
BILIRUBIN TOTAL: 0.3 mg/dL (ref 0.3–1.2)
BUN: 29 mg/dL — AB (ref 6–20)
CHLORIDE: 104 mmol/L (ref 101–111)
CO2: 29 mmol/L (ref 22–32)
Calcium: 7.5 mg/dL — ABNORMAL LOW (ref 8.9–10.3)
Creatinine, Ser: 4.38 mg/dL — ABNORMAL HIGH (ref 0.61–1.24)
GFR calc Af Amer: 15 mL/min — ABNORMAL LOW (ref >60)
GFR, EST NON AFRICAN AMERICAN: 13 mL/min — AB (ref >60)
Glucose, Bld: 161 mg/dL — ABNORMAL HIGH (ref 65–99)
POTASSIUM: 3.5 mmol/L (ref 3.5–5.1)
Sodium: 140 mmol/L (ref 135–145)
TOTAL PROTEIN: 5.7 g/dL — AB (ref 6.5–8.1)

## 2016-06-23 LAB — I-STAT TROPONIN, ED: TROPONIN I, POC: 0.04 ng/mL (ref 0.00–0.08)

## 2016-06-23 LAB — PROTIME-INR
INR: 1.08
Prothrombin Time: 14.1 seconds (ref 11.4–15.2)

## 2016-06-23 LAB — CBG MONITORING, ED: GLUCOSE-CAPILLARY: 167 mg/dL — AB (ref 65–99)

## 2016-06-23 LAB — PREPARE RBC (CROSSMATCH)

## 2016-06-23 MED ORDER — SODIUM CHLORIDE 0.9 % IV SOLN
10.0000 mL/h | Freq: Once | INTRAVENOUS | Status: AC
  Administered 2016-06-23: 10 mL/h via INTRAVENOUS
  Administered 2016-06-24: 20 mL/h via INTRAVENOUS

## 2016-06-23 NOTE — ED Notes (Signed)
Patient in CT at this time.

## 2016-06-23 NOTE — Telephone Encounter (Signed)
Rn call patients wife about her husband appt tomorrow with Dr.Sethi. Pts wife stated she will not be at the appt tomorrow with her husband,but has concerns. Rn stated pt last saw Dr. Carles Collet in 11/2015 for a second opinion for neurology issus. PT saw Dr.Sethi in 05/2015. Rn ask if patient will be seeing Dr.Sethi ongoing or [facility]. PTs wife her husband will be seeing Dr. Leonie Man ongoing.Pts wife stated " I dont remember my husband going to [facility] neurology, it must of been the PCP that referred him there. PTs wife thinks her husband is getting dialysis too fast, and that could be causing his mini strokes. She wants Dr.Sethi to call the dialysis center to make recommendations. Pts wife will not be at appt tomorrow. Rn ask if anyone else could accompany her husband at the appt. The wife stated no one else could be there. Rn recommend that wife write down questions for Dr.Sethi, and have her husband bring them here. Pts wife verbalized understanding.

## 2016-06-23 NOTE — ED Notes (Signed)
MD at bedside. 

## 2016-06-23 NOTE — Telephone Encounter (Signed)
Referral is in [facility] work que, they have already contacted patient, awaiting return call

## 2016-06-23 NOTE — Telephone Encounter (Signed)
I will discuss his issues with the patient at the upcoming follow-up visit with me tomorrow

## 2016-06-23 NOTE — ED Triage Notes (Signed)
Pt states he feels like he is having a stroke. He feels weak all over, and says he is having trouble getting his thoughts together. Pt's speech appears clear to RN. Pt states he believes these symptoms began last night. Pt has hx of stroke. Grips equal, no drift, no facial droop in triage.

## 2016-06-23 NOTE — ED Provider Notes (Signed)
[facility] DEPT Provider Note   CSN: [id number] Arrival date & time: 06/23/16  1618     History   Chief Complaint Chief Complaint  Patient presents with  . Stroke Symptoms    HPI [name] is a 71 y.o. male.  HPI Patient presents with concern of weakness. Patient is here with his wife assists with the history of present illness.  He notes one prior syncopal episode about one week ago, also with that began during dialysis. Today, the patient notes that near the end of dialysis became suddenly weak all over. Otherwise, the patient notes that since his evaluation one week ago, which included provision of 1 unit PRBC, he had been generally well. Now, since onset a few hours ago, he has had generalized weakness, without focal weakness, paresthesia, no speech deficits, no confusion, no disorientation. There is mild lightheadedness, but no syncope. Patient acknowledges multiple medical issues including end-stage renal disease. He denies any melena, bright red blood per rectum, hematemesis, hemoptysis.   He has had endoscopy with ablation of upper GI lesions, he has no recent colonoscopy.  Past Medical History:  Diagnosis Date  . 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 Anemia, iron deficiency 03/24/2011   a. Recurrent GI bleed, tx with periodic iron infusions.  . 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 AVM (arteriovenous malformation) of duodenum, acquired    egds in 01/2012, 04/2011  . BPH (benign prostatic hyperplasia)   . CAD (coronary artery disease)    a. Cath 09/2012: moderate borderline CAD in mid LAD/small diagonal branch, mild RCA stenosis, to be managed medically   . Chronic lower back pain   . Diabetic peripheral neuropathy ([place]) 2014   foot pain.  . Fatty liver    on CT of 11/2010  . GERD (gastroesophageal reflux disease)   . GI bleed    a. Recurrent GI bleed, tx with IV iron. b. Per heme notes - likely AVMs 04/2012 (tx with cauterization several months  ago).  . Hematuria    a. Urology note scan from 07/2012: cystoscopy without evidence for bladder lesion, only lateral hypertrophy of posterior urethra, bladder impression from BPH. b. Pt states he had "some tests" scheduled for later in July 2014.  . High cholesterol   . Hypertension   . 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 Orthostatic hypotension    a. Tx with florinef.  . Peripheral neuropathy ([place])   . Polyp, colonic    Colonoscopy 01/2012 "benign" polyp  . Prostate cancer ([place])   . Sleep apnea    "had mask; couldn't sleep in it" (09/20/2012)  . Smoker   . Stage III chronic kidney disease    a. Stage 3 (DM with complications ->CKD, peripheral neuropathy).  . Stroke ([place])    x 2  . Type II diabetes mellitus ([place])    a. Dx 1994, uncontrolled.     Patient Active Problem List   Diagnosis Date Noted  . Anemia in chronic kidney disease, on chronic dialysis ([place])   . Anemia due to chronic kidney disease   . Diabetic retinopathy ([place]) 01/16/2016  . Dysphagia 12/21/2015  . Environmental allergies 12/04/2015  . Allergic rhinitis 10/28/2015  . ESRD (end stage renal disease) ([place]) 10/28/2015  . Stroke ([place]) 04/06/2015  . 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 Malnutrition of moderate degree 04/05/2015  . Acute renal failure superimposed on stage 4 chronic kidney disease ([place]) 04/04/2015  . Symptomatic anemia 04/03/2015  . Benign paroxysmal positional  vertigo 09/26/2014  . Weakness of right lower extremity 02/28/2014  . Metatarsalgia of both feet 09/13/2013  . Equinus deformity of foot, acquired 09/13/2013  . Diabetes mellitus type 2, controlled, with complications ([place]) 03/88/8280  . GERD (gastroesophageal reflux disease) 03/24/2013  . GI bleed 02/28/2013  . Cerebral infarction ([place]) 11/17/2012  . Dizziness and giddiness 10/31/2012  . Prostate cancer ([place]) 10/27/2012  . Diabetic neuropathy ([place]) 10/04/2012  . CAD (coronary artery disease) 10/04/2012  .  Hoarseness 06/26/2012  . Hematuria 05/24/2012  . Tinea pedis 05/24/2012  . Back pain 02/23/2012  . Erectile dysfunction 02/03/2012  . Type 2 diabetes, uncontrolled, with neuropathy ([place]) 02/01/2012  . Essential hypertension 02/01/2012  . Tobacco use 02/01/2012  . Hyperlipidemia 02/01/2012  . Anemia of renal disease 05/14/2011    Past Surgical History:  Procedure Laterality Date  . ENTEROSCOPY N/A 03/01/2013   Procedure: ENTEROSCOPY;  Surgeon: Inda Castle, MD;  Location: [facility];  Service: Endoscopy;  Laterality: N/A;  . GIVENS CAPSULE STUDY N/A 04/05/2015   Procedure: GIVENS CAPSULE STUDY;  Surgeon: Gatha Mayer, MD;  Location: [facility];  Service: Endoscopy;  Laterality: N/A;  . INGUINAL HERNIA REPAIR Right 2011  . LEFT HEART CATHETERIZATION WITH CORONARY ANGIOGRAM N/A 09/21/2012   Procedure: LEFT HEART CATHETERIZATION WITH CORONARY ANGIOGRAM;  Surgeon: Peter M Martinique, MD;  Location: [facility] CATH [facility];  Service: Cardiovascular;  Laterality: N/A;  . LUMBAR DISC SURGERY  1980's  . LYMPHADENECTOMY Bilateral 01/19/2013   Procedure: LYMPHADENECTOMY;  Surgeon: Bernestine Amass, MD;  Location: [facility];  Service: Urology;  Laterality: Bilateral;  . ROBOT ASSISTED LAPAROSCOPIC RADICAL PROSTATECTOMY N/A 01/19/2013   Procedure: ROBOTIC ASSISTED LAPAROSCOPIC RADICAL PROSTATECTOMY;  Surgeon: Bernestine Amass, MD;  Location: [facility];  Service: Urology;  Laterality: N/A;  . SHOULDER OPEN ROTATOR CUFF REPAIR Left 1980's       Home Medications    Prior to Admission medications   Medication Sig Start Date End Date Taking? Authorizing Provider  benzonatate (TESSALON) 100 MG capsule Take 1 capsule (100 mg total) by mouth 3 (three) times daily as needed for cough. 04/30/16  Yes Edward Saguier, PA-C  carvedilol (COREG) 3.125 MG tablet Take 1 tablet (3.125 mg total) by mouth 2 (two) times daily with a meal. 06/19/16  Yes Mackie Pai, PA-C  clopidogrel (PLAVIX) 75 MG tablet Take 1 tablet (75 mg total) by  mouth daily. 06/19/16  Yes Edward Saguier, PA-C  gabapentin (NEURONTIN) 300 MG capsule Take 1 capsule (300 mg total) by mouth 3 (three) times daily. Patient taking differently: Take 300 mg by mouth daily.  06/19/16  Yes Edward Saguier, PA-C  omeprazole (PRILOSEC) 40 MG capsule Take 1 capsule (40 mg total) by mouth daily. 06/19/16  Yes Edward Saguier, PA-C  rosuvastatin (CRESTOR) 40 MG tablet Take 1 tablet (40 mg total) by mouth daily. 06/19/16  Yes Mackie Pai, PA-C    Family History Family History  Problem Relation Age of Onset  . Stroke Father     Died at 74  . Diabetes Mother   . Heart attack Brother     Died at 49  . Diabetes Sister   . Stomach cancer Brother   . Heart attack Sister     Social History Social History  Substance Use Topics  . Smoking status: Current Some Day Smoker    Packs/day: 1.00    Years: 43.00    Types: Cigarettes    Start date: 04/15/1968  . Smokeless tobacco: Never Used  Comment: 07-22--2016  still smoking  . Alcohol use No     Comment: 09/20/2012 "Used to; stopped ~ 2009; never had problem w/it"     Allergies   Patient has no known allergies.   Review of Systems Review of Systems  Constitutional:       Per HPI, otherwise negative  HENT:       Per HPI, otherwise negative  Respiratory:       Per HPI, otherwise negative  Cardiovascular:       Per HPI, otherwise negative  Gastrointestinal: Negative for vomiting.  Endocrine:       Negative aside from HPI  Genitourinary:       Neg aside from HPI   Musculoskeletal:       Per HPI, otherwise negative  Skin: Negative.   Neurological: Negative for syncope.     Physical Exam Updated Vital Signs BP 133/80 (BP Location: Right Arm)   Pulse 99   Temp 97.8 F (36.6 C) (Oral)   Resp 15   Ht 5\' 7"  (1.702 m)   Wt 140 lb (63.5 kg)   SpO2 98%   BMI 21.93 kg/m   Physical Exam  Constitutional: He is oriented to person, place, and time. He appears well-developed. No distress.  HENT:  Head:  Normocephalic and atraumatic.  Eyes: Conjunctivae and EOM are normal.  Cardiovascular: Normal rate and regular rhythm.   Pulmonary/Chest: Effort normal. No stridor. No respiratory distress.  Right upper chest hemodialysis catheter unremarkable  Abdominal: He exhibits no distension.  Genitourinary:  Genitourinary Comments: Hemoccult positive, stool black  Musculoskeletal: He exhibits no edema.  Neurological: He is alert and oriented to person, place, and time.  Skin: Skin is warm and dry.  Psychiatric: He has a normal mood and affect.  Nursing note and vitals reviewed.    ED Treatments / Results  Labs (all labs ordered are listed, but only abnormal results are displayed) Labs Reviewed  APTT - Abnormal; Notable for the following:       Result Value   aPTT 57 (*)    All other components within normal limits  CBC - Abnormal; Notable for the following:    RBC 1.61 (*)    Hemoglobin 4.9 (*)    HCT 15.0 (*)    RDW 16.7 (*)    All other components within normal limits  COMPREHENSIVE METABOLIC PANEL - Abnormal; Notable for the following:    Glucose, Bld 161 (*)    BUN 29 (*)    Creatinine, Ser 4.38 (*)    Calcium 7.5 (*)    Total Protein 5.7 (*)    Albumin 2.9 (*)    ALT 13 (*)    GFR calc non Af Amer 13 (*)    GFR calc Af Amer 15 (*)    All other components within normal limits  CBG MONITORING, ED - Abnormal; Notable for the following:    Glucose-Capillary 167 (*)    All other components within normal limits  I-STAT CHEM 8, ED - Abnormal; Notable for the following:    BUN 31 (*)    Creatinine, Ser 4.60 (*)    Glucose, Bld 158 (*)    Calcium, Ion 0.96 (*)    Hemoglobin 5.1 (*)    HCT 15.0 (*)    All other components within normal limits  POC OCCULT BLOOD, ED - Abnormal; Notable for the following:    Fecal Occult Bld POSITIVE (*)    All other components within normal limits  PROTIME-INR  DIFFERENTIAL  I-STAT TROPOININ, ED  TYPE AND SCREEN  PREPARE RBC (CROSSMATCH)     EKG  EKG Interpretation  Date/Time:  Monday June 23 2016 16:25:38 EDT Ventricular Rate:  99 PR Interval:  154 QRS Duration: 90 QT Interval:  392 QTC Calculation: 503 R Axis:   17 Text Interpretation:  Normal sinus rhythm Minimal voltage criteria for LVH, may be normal variant ST-t wave abnormality Abnormal ekg Confirmed by Carmin Muskrat  MD [phone]) on 06/23/2016 4:53:17 PM       Radiology Ct Head Wo Contrast  Result Date: 06/23/2016 CLINICAL DATA:  Initial evaluation for numbness with slurred speech EXAM: CT HEAD WITHOUT CONTRAST TECHNIQUE: Contiguous axial images were obtained from the base of the skull through the vertex without intravenous contrast. COMPARISON:  Recent MRI and CT from 06/13/2016. FINDINGS: Brain: Stable atrophy with chronic microvascular ischemic disease. Superimposed remote lacunar infarcts present within the right basal ganglia. Recently identified right thalamic infarct not well visualized. No acute intracranial hemorrhage. No evidence for new acute large vessel territory infarct. No mass lesion, midline shift or mass effect. No hydrocephalus. No extra-axial fluid collection. Vascular: No asymmetric hyper for dense vessel. Scattered vascular calcifications noted within the carotid siphons. Skull: Scalp soft tissues within normal limits.  Calvarium intact. Sinuses/Orbits: Globes normal soft tissues normal. Paranasal sinuses and mastoid air cells are clear. Other: None. IMPRESSION: 1. No acute intracranial process. Recently identified right thalamic lacunar infarct not well seen. 2. Stable atrophy with chronic microvascular ischemic disease. Multiple remote lacunar infarcts within the bilateral basal ganglia, stable. Electronically Signed   By: Jeannine Boga M.D.   On: 06/23/2016 17:37    Procedures Procedures (including critical care time)  Medications Ordered in ED Medications  0.9 %  sodium chloride infusion (not administered)    Initial labs notable  for hemoglobin 4.9. Chart review notable for prior episodes of anemia. Hemoccult-positive status and anemia concerning, and similar to evaluation last week. Patient has had type and screen, will receive transfusion here.  7:57 PM I d/w Dr. Henrene Pastor of GI - service will consult in the AM.   Initial Impression / Assessment and Plan / ED Course  I have reviewed the triage vital signs and the nursing notes.  Pertinent labs & imaging results that were available during my care of the patient were reviewed by me and considered in my medical decision making (see chart for details).  Patient with end-stage renal disease presents with concern of weakness. Patient does have history of recent stroke, but is currently on Plavix, has no focal neurologic deficits, reassuring head CT, there is low suspicion for acute new ischemic event. No evidence for intracranial hemorrhage. Patient does have critically low hemoglobin, and was Hemoccult-positive status and discuss his case with our gastroenterologists, started transfusion in the emergency department. Patient is not hypotensive, and will be evaluated by our gastrin neurology colleagues in the morning. Given the patient's crackly abnormal hemoglobin he required admission for further evaluation and management.  Final Clinical Impressions(s) / ED Diagnoses  GI bleed Symptomatic anemia   CRITICAL CARE Performed by: Carmin Muskrat Total critical care time: 35 minutes Critical care time was exclusive of separately billable procedures and treating other patients. Critical care was necessary to treat or prevent imminent or life-threatening deterioration. Critical care was time spent personally by me on the following activities: development of treatment plan with patient and/or surrogate as well as nursing, discussions with consultants, evaluation of patient's response to treatment, examination of  patient, obtaining history from patient or surrogate, ordering  and performing treatments and interventions, ordering and review of laboratory studies, ordering and review of radiographic studies, pulse oximetry and re-evaluation of patient's condition.    Carmin Muskrat, MD 06/23/16 [phone]

## 2016-06-23 NOTE — ED Notes (Signed)
Pt CBG was 167

## 2016-06-23 NOTE — Telephone Encounter (Signed)
Message sent to Dr. Leonie Man per wife concerns.Pt has appt on 06/24/2016.

## 2016-06-23 NOTE — Telephone Encounter (Signed)
Pt wife called in because she wanted these concerns mentioned should she not be able to attend the appointment with her husband on 06-24-2016  1. Pt's right leg needs to be examined.  2. Pt wife suggested that the dialysis center where pt goes be contacted on a possible adjustment on his treatments due to the mini strokes he has had

## 2016-06-23 NOTE — ED Notes (Signed)
Abnormal CHem-8 reported to Dr. Vanita Panda

## 2016-06-24 ENCOUNTER — Ambulatory Visit: Payer: Medicare Other | Admitting: Medical

## 2016-06-24 ENCOUNTER — Encounter (HOSPITAL_COMMUNITY): Payer: Self-pay | Admitting: Family Medicine

## 2016-06-24 ENCOUNTER — Ambulatory Visit: Payer: Medicare Other | Admitting: Neurology

## 2016-06-24 DIAGNOSIS — E11319 Type 2 diabetes mellitus with unspecified diabetic retinopathy without macular edema: Secondary | ICD-10-CM | POA: Diagnosis present

## 2016-06-24 DIAGNOSIS — D649 Anemia, unspecified: Secondary | ICD-10-CM | POA: Diagnosis not present

## 2016-06-24 DIAGNOSIS — K921 Melena: Secondary | ICD-10-CM | POA: Diagnosis present

## 2016-06-24 DIAGNOSIS — I10 Essential (primary) hypertension: Secondary | ICD-10-CM | POA: Diagnosis not present

## 2016-06-24 DIAGNOSIS — Z8546 Personal history of malignant neoplasm of prostate: Secondary | ICD-10-CM | POA: Diagnosis not present

## 2016-06-24 DIAGNOSIS — D62 Acute posthemorrhagic anemia: Secondary | ICD-10-CM | POA: Diagnosis present

## 2016-06-24 DIAGNOSIS — I25119 Atherosclerotic heart disease of native coronary artery with unspecified angina pectoris: Secondary | ICD-10-CM | POA: Diagnosis not present

## 2016-06-24 DIAGNOSIS — E1142 Type 2 diabetes mellitus with diabetic polyneuropathy: Secondary | ICD-10-CM | POA: Diagnosis present

## 2016-06-24 DIAGNOSIS — Z992 Dependence on renal dialysis: Secondary | ICD-10-CM

## 2016-06-24 DIAGNOSIS — E1165 Type 2 diabetes mellitus with hyperglycemia: Secondary | ICD-10-CM | POA: Diagnosis present

## 2016-06-24 DIAGNOSIS — D631 Anemia in chronic kidney disease: Secondary | ICD-10-CM | POA: Diagnosis present

## 2016-06-24 DIAGNOSIS — E1122 Type 2 diabetes mellitus with diabetic chronic kidney disease: Secondary | ICD-10-CM | POA: Diagnosis present

## 2016-06-24 DIAGNOSIS — N2581 Secondary hyperparathyroidism of renal origin: Secondary | ICD-10-CM | POA: Diagnosis present

## 2016-06-24 DIAGNOSIS — K631 Perforation of intestine (nontraumatic): Secondary | ICD-10-CM | POA: Diagnosis present

## 2016-06-24 DIAGNOSIS — I120 Hypertensive chronic kidney disease with stage 5 chronic kidney disease or end stage renal disease: Secondary | ICD-10-CM | POA: Diagnosis present

## 2016-06-24 DIAGNOSIS — M545 Low back pain: Secondary | ICD-10-CM | POA: Diagnosis present

## 2016-06-24 DIAGNOSIS — G8929 Other chronic pain: Secondary | ICD-10-CM | POA: Diagnosis present

## 2016-06-24 DIAGNOSIS — Z7902 Long term (current) use of antithrombotics/antiplatelets: Secondary | ICD-10-CM | POA: Diagnosis not present

## 2016-06-24 DIAGNOSIS — Z8673 Personal history of transient ischemic attack (TIA), and cerebral infarction without residual deficits: Secondary | ICD-10-CM | POA: Diagnosis not present

## 2016-06-24 DIAGNOSIS — E1140 Type 2 diabetes mellitus with diabetic neuropathy, unspecified: Secondary | ICD-10-CM | POA: Diagnosis present

## 2016-06-24 DIAGNOSIS — N186 End stage renal disease: Secondary | ICD-10-CM | POA: Diagnosis present

## 2016-06-24 DIAGNOSIS — G4730 Sleep apnea, unspecified: Secondary | ICD-10-CM | POA: Diagnosis present

## 2016-06-24 DIAGNOSIS — K219 Gastro-esophageal reflux disease without esophagitis: Secondary | ICD-10-CM | POA: Diagnosis present

## 2016-06-24 DIAGNOSIS — K558 Other vascular disorders of intestine: Secondary | ICD-10-CM | POA: Diagnosis not present

## 2016-06-24 DIAGNOSIS — R531 Weakness: Secondary | ICD-10-CM | POA: Diagnosis present

## 2016-06-24 DIAGNOSIS — Z7982 Long term (current) use of aspirin: Secondary | ICD-10-CM | POA: Diagnosis not present

## 2016-06-24 DIAGNOSIS — K5521 Angiodysplasia of colon with hemorrhage: Secondary | ICD-10-CM | POA: Diagnosis present

## 2016-06-24 DIAGNOSIS — I2510 Atherosclerotic heart disease of native coronary artery without angina pectoris: Secondary | ICD-10-CM | POA: Diagnosis present

## 2016-06-24 DIAGNOSIS — K760 Fatty (change of) liver, not elsewhere classified: Secondary | ICD-10-CM | POA: Diagnosis present

## 2016-06-24 LAB — RENAL FUNCTION PANEL
ANION GAP: 10 (ref 5–15)
Albumin: 2.7 g/dL — ABNORMAL LOW (ref 3.5–5.0)
BUN: 40 mg/dL — ABNORMAL HIGH (ref 6–20)
CO2: 26 mmol/L (ref 22–32)
Calcium: 7.3 mg/dL — ABNORMAL LOW (ref 8.9–10.3)
Chloride: 103 mmol/L (ref 101–111)
Creatinine, Ser: 5.73 mg/dL — ABNORMAL HIGH (ref 0.61–1.24)
GFR calc Af Amer: 10 mL/min — ABNORMAL LOW (ref >60)
GFR calc non Af Amer: 9 mL/min — ABNORMAL LOW (ref >60)
GLUCOSE: 180 mg/dL — AB (ref 65–99)
POTASSIUM: 3.9 mmol/L (ref 3.5–5.1)
Phosphorus: 4.1 mg/dL (ref 2.5–4.6)
SODIUM: 139 mmol/L (ref 135–145)

## 2016-06-24 LAB — CBC
HEMATOCRIT: 20.8 % — AB (ref 39.0–52.0)
HEMOGLOBIN: 6.9 g/dL — AB (ref 13.0–17.0)
MCH: 28.6 pg (ref 26.0–34.0)
MCHC: 33.2 g/dL (ref 30.0–36.0)
MCV: 86.3 fL (ref 78.0–100.0)
Platelets: 209 10*3/uL (ref 150–400)
RBC: 2.41 MIL/uL — ABNORMAL LOW (ref 4.22–5.81)
RDW: 19 % — AB (ref 11.5–15.5)
WBC: 6.2 10*3/uL (ref 4.0–10.5)

## 2016-06-24 LAB — TROPONIN I: Troponin I: 0.03 ng/mL (ref <0.03)

## 2016-06-24 LAB — PREPARE RBC (CROSSMATCH)

## 2016-06-24 MED ORDER — ROSUVASTATIN CALCIUM 20 MG PO TABS
40.0000 mg | ORAL_TABLET | Freq: Every day | ORAL | Status: DC
  Administered 2016-06-24 – 2016-06-25 (×2): 40 mg via ORAL
  Filled 2016-06-24 (×2): qty 2

## 2016-06-24 MED ORDER — PANTOPRAZOLE SODIUM 40 MG PO TBEC
40.0000 mg | DELAYED_RELEASE_TABLET | Freq: Every day | ORAL | Status: DC
  Administered 2016-06-25: 40 mg via ORAL
  Filled 2016-06-24 (×3): qty 1

## 2016-06-24 MED ORDER — PANTOPRAZOLE SODIUM 40 MG IV SOLR
40.0000 mg | Freq: Two times a day (BID) | INTRAVENOUS | Status: DC
  Administered 2016-06-24 (×2): 40 mg via INTRAVENOUS
  Filled 2016-06-24 (×2): qty 40

## 2016-06-24 MED ORDER — SODIUM CHLORIDE 0.9 % IV SOLN: Freq: Once | INTRAVENOUS | Status: DC

## 2016-06-24 MED ORDER — GABAPENTIN 300 MG PO CAPS
300.0000 mg | ORAL_CAPSULE | Freq: Every day | ORAL | Status: DC
  Administered 2016-06-24 – 2016-06-26 (×3): 300 mg via ORAL
  Filled 2016-06-24 (×3): qty 1

## 2016-06-24 MED ORDER — CARVEDILOL 3.125 MG PO TABS
3.1250 mg | ORAL_TABLET | Freq: Two times a day (BID) | ORAL | Status: DC
  Administered 2016-06-24 – 2016-06-25 (×4): 3.125 mg via ORAL
  Filled 2016-06-24 (×4): qty 1

## 2016-06-24 NOTE — Consult Note (Signed)
Indication for Consultation:  Management of ESRD/hemodialysis, anemia, hypertension/volume, and secondary hyperparathyroidism.  HPI: [name] is a 71 y.o. male with ESRD, CAD, HTN, Type 2 DM, Hx CVA who was admitted with severe anemia due to a GI bleed.  Of note, he was admitted to [facility] 06/13/16 with slurred speech and RLE weakness and found to have small punctate acute CVA on MRI.  He has ESRD and dialyzes MWF at [facility] in [place] over past ~ 1 year. He has a hx of GI bleeding in the past with known AVMs and is admitted on this occasion with recent black stools, weakness and Hgb down to 5.1 upon presentation. He had HD yesterday where symptoms of weakness were more pronounced and he went to the ED.   Past Medical History:  Diagnosis Date  . 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 Anemia, iron deficiency 03/24/2011   a. Recurrent GI bleed, tx with periodic iron infusions.  . 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 AVM (arteriovenous malformation) of duodenum, acquired    egds in 01/2012, 04/2011  . BPH (benign prostatic hyperplasia)   . CAD (coronary artery disease)    a. Cath 09/2012: moderate borderline CAD in mid LAD/small diagonal branch, mild RCA stenosis, to be managed medically   . Chronic lower back pain   . Diabetic peripheral neuropathy ([place]) 2014   foot pain.  . Fatty liver    on CT of 11/2010  . GERD (gastroesophageal reflux disease)   . GI bleed    a. Recurrent GI bleed, tx with IV iron. b. Per heme notes - likely AVMs 04/2012 (tx with cauterization several months ago).  . Hematuria    a. Urology note scan from 07/2012: cystoscopy without evidence for bladder lesion, only lateral hypertrophy of posterior urethra, bladder impression from BPH. b. Pt states he had "some tests" scheduled for later in July 2014.  . High cholesterol   . Hypertension   . 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 Orthostatic hypotension    a. Tx with florinef.  . Peripheral  neuropathy ([place])   . Polyp, colonic    Colonoscopy 01/2012 "benign" polyp  . Prostate cancer ([place])   . Sleep apnea    "had mask; couldn't sleep in it" (09/20/2012)  . Smoker   . Stage III chronic kidney disease    a. Stage 3 (DM with complications ->CKD, peripheral neuropathy).  . Stroke ([place])    x 2  . Type II diabetes mellitus ([place])    a. Dx 1994, uncontrolled.    Past Surgical History:  Procedure Laterality Date  . ENTEROSCOPY N/A 03/01/2013   Procedure: ENTEROSCOPY;  Surgeon: Inda Castle, MD;  Location: [place];  Service: Endoscopy;  Laterality: N/A;  . GIVENS CAPSULE STUDY N/A 04/05/2015   Procedure: GIVENS CAPSULE STUDY;  Surgeon: Gatha Mayer, MD;  Location: [place];  Service: Endoscopy;  Laterality: N/A;  . INGUINAL HERNIA REPAIR Right 2011  . LEFT HEART CATHETERIZATION WITH CORONARY ANGIOGRAM N/A 09/21/2012   Procedure: LEFT HEART CATHETERIZATION WITH CORONARY ANGIOGRAM;  Surgeon: Peter M Martinique, MD;  Location: [facility] CATH LAB;  Service: Cardiovascular;  Laterality: N/A;  . LUMBAR DISC SURGERY  1980's  . LYMPHADENECTOMY Bilateral 01/19/2013   Procedure: LYMPHADENECTOMY;  Surgeon: Bernestine Amass, MD;  Location: [facility];  Service: Urology;  Laterality: Bilateral;  . ROBOT ASSISTED LAPAROSCOPIC RADICAL PROSTATECTOMY N/A 01/19/2013   Procedure: ROBOTIC ASSISTED LAPAROSCOPIC RADICAL PROSTATECTOMY;  Surgeon: Bernestine Amass, MD;  Location: [facility];  Service: Urology;  Laterality: N/A;  . SHOULDER OPEN ROTATOR CUFF REPAIR Left 1980's   Social History:  reports that he has been smoking Cigarettes.  He started smoking about 48 years ago. He has a 43.00 pack-year smoking history. He has never used smokeless tobacco. He reports that he does not drink alcohol or use drugs. Allergies: No Known Allergies Family History  Problem Relation Age of Onset  . Stroke Father     Died at 66  . Diabetes Mother   . Heart attack Brother     Died at 1  . Diabetes Sister   . Stomach cancer  Brother   . Heart attack Sister     Medications:  Scheduled: . sodium chloride   Intravenous Once  . carvedilol  3.125 mg Oral BID WC  . gabapentin  300 mg Oral Daily  . pantoprazole  40 mg Intravenous Q12H  . rosuvastatin  40 mg Oral q1800   ROS: as pe rHPI Blood pressure (!) 155/92, pulse 82, temperature 97.4 F (36.3 C), temperature source Oral, resp. rate 16, height 5' 7"  (1.702 m), weight 63.9 kg (140 lb 14 oz), SpO2 100 %.  General appearance: alert and cooperative Head: Normocephalic, without obvious abnormality, atraumatic Eyes: negative Ears: normal TM's and external ear canals both ears Resp: clear to auscultation bilaterally Chest wall: no tenderness Cardio: regular rate and rhythm, S1, S2 normal, no murmur, click, rub or gallop GI: soft, non-tender; bowel sounds normal; no masses,  no organomegaly Extremities: extremities normal, atraumatic, no cyanosis or edema  LUE AV access Skin: Skin color, texture, turgor normal. No rashes or lesions Neurologic: Grossly normal  Speech slightly dysarthric Results for orders placed or performed during the hospital encounter of 06/23/16 (from the past 48 hour(s))  Protime-INR     Status: None   Collection Time: 06/23/16  4:38 PM  Result Value Ref Range   Prothrombin Time 14.1 11.4 - 15.2 seconds   INR 1.08   APTT     Status: Abnormal   Collection Time: 06/23/16  4:38 PM  Result Value Ref Range   aPTT 57 (H) 24 - 36 seconds    Comment:        IF BASELINE aPTT IS ELEVATED, SUGGEST PATIENT RISK ASSESSMENT BE USED TO DETERMINE APPROPRIATE ANTICOAGULANT THERAPY.   CBC     Status: Abnormal   Collection Time: 06/23/16  4:38 PM  Result Value Ref Range   WBC 5.4 4.0 - 10.5 K/uL   RBC 1.61 (L) 4.22 - 5.81 MIL/uL   Hemoglobin 4.9 (LL) 13.0 - 17.0 g/dL    Comment: REPEATED TO VERIFY CRITICAL RESULT CALLED TO, READ BACK BY AND VERIFIED WITH: RCelesta Aver RN [phone] [id number] GREEN R    HCT 15.0 (L) 39.0 - 52.0 %   MCV 93.2 78.0 - 100.0  fL   MCH 30.4 26.0 - 34.0 pg   MCHC 32.7 30.0 - 36.0 g/dL   RDW 16.7 (H) 11.5 - 15.5 %   Platelets 246 150 - 400 K/uL  Differential     Status: None   Collection Time: 06/23/16  4:38 PM  Result Value Ref Range   Neutrophils Relative % 68 %   Neutro Abs 3.7 1.7 - 7.7 K/uL   Lymphocytes Relative 17 %   Lymphs Abs 0.9 0.7 - 4.0 K/uL   Monocytes Relative 10 %   Monocytes Absolute 0.5 0.1 - 1.0 K/uL   Eosinophils Relative 4 %   Eosinophils Absolute  0.2 0.0 - 0.7 K/uL   Basophils Relative 1 %   Basophils Absolute 0.0 0.0 - 0.1 K/uL  Comprehensive metabolic panel     Status: Abnormal   Collection Time: 06/23/16  4:38 PM  Result Value Ref Range   Sodium 140 135 - 145 mmol/L   Potassium 3.5 3.5 - 5.1 mmol/L   Chloride 104 101 - 111 mmol/L   CO2 29 22 - 32 mmol/L   Glucose, Bld 161 (H) 65 - 99 mg/dL   BUN 29 (H) 6 - 20 mg/dL   Creatinine, Ser 4.38 (H) 0.61 - 1.24 mg/dL   Calcium 7.5 (L) 8.9 - 10.3 mg/dL   Total Protein 5.7 (L) 6.5 - 8.1 g/dL   Albumin 2.9 (L) 3.5 - 5.0 g/dL   AST 17 15 - 41 U/L   ALT 13 (L) 17 - 63 U/L   Alkaline Phosphatase 94 38 - 126 U/L   Total Bilirubin 0.3 0.3 - 1.2 mg/dL   GFR calc non Af Amer 13 (L) >60 mL/min   GFR calc Af Amer 15 (L) >60 mL/min    Comment: (NOTE) The eGFR has been calculated using the CKD EPI equation. This calculation has not been validated in all clinical situations. eGFR's persistently <60 mL/min signify possible Chronic Kidney Disease.    Anion gap 7 5 - 15  I-stat troponin, ED     Status: None   Collection Time: 06/23/16  4:52 PM  Result Value Ref Range   Troponin i, poc 0.04 0.00 - 0.08 ng/mL   Comment 3            Comment: Due to the release kinetics of cTnI, a negative result within the first hours of the onset of symptoms does not rule out myocardial infarction with certainty. If myocardial infarction is still suspected, repeat the test at appropriate intervals.   I-Stat Chem 8, ED     Status: Abnormal   Collection  Time: 06/23/16  4:54 PM  Result Value Ref Range   Sodium 141 135 - 145 mmol/L   Potassium 3.6 3.5 - 5.1 mmol/L   Chloride 101 101 - 111 mmol/L   BUN 31 (H) 6 - 20 mg/dL   Creatinine, Ser 4.60 (H) 0.61 - 1.24 mg/dL   Glucose, Bld 158 (H) 65 - 99 mg/dL   Calcium, Ion 0.96 (L) 1.15 - 1.40 mmol/L   TCO2 30 0 - 100 mmol/L   Hemoglobin 5.1 (LL) 13.0 - 17.0 g/dL   HCT 15.0 (L) 39.0 - 52.0 %   Comment NOTIFIED PHYSICIAN   CBG monitoring, ED     Status: Abnormal   Collection Time: 06/23/16  5:27 PM  Result Value Ref Range   Glucose-Capillary 167 (H) 65 - 99 mg/dL  Prepare RBC     Status: None   Collection Time: 06/23/16  6:21 PM  Result Value Ref Range   Order Confirmation ORDER PROCESSED BY BLOOD BANK   Type and screen [facility]     Status: None (Preliminary result)   Collection Time: 06/23/16  6:22 PM  Result Value Ref Range   ABO/RH(D) AB POS    Antibody Screen NEG    Sample Expiration 06/26/2016    Unit Number [id number]    Blood Component Type RED CELLS,LR    Unit division 00    Status of Unit ISSUED,FINAL    Transfusion Status OK TO TRANSFUSE    Crossmatch Result Compatible    Unit Number [id number]  Blood Component Type RED CELLS,LR    Unit division 00    Status of Unit ISSUED    Transfusion Status OK TO TRANSFUSE    Crossmatch Result Compatible    Unit Number [id number]    Blood Component Type RED CELLS,LR    Unit division 00    Status of Unit ALLOCATED    Transfusion Status OK TO TRANSFUSE    Crossmatch Result Compatible    Unit Number [id number]    Blood Component Type RED CELLS,LR    Unit division 00    Status of Unit ALLOCATED    Transfusion Status OK TO TRANSFUSE    Crossmatch Result Compatible   POC occult blood, ED     Status: Abnormal   Collection Time: 06/23/16  7:01 PM  Result Value Ref Range   Fecal Occult Bld POSITIVE (A) NEGATIVE  CBC     Status: Abnormal   Collection Time: 06/24/16  5:55 AM  Result Value Ref Range    WBC 6.2 4.0 - 10.5 K/uL   RBC 2.41 (L) 4.22 - 5.81 MIL/uL   Hemoglobin 6.9 (LL) 13.0 - 17.0 g/dL    Comment: REPEATED TO VERIFY POST TRANSFUSION SPECIMEN CRITICAL VALUE NOTED.  VALUE IS CONSISTENT WITH PREVIOUSLY REPORTED AND CALLED VALUE.    HCT 20.8 (L) 39.0 - 52.0 %   MCV 86.3 78.0 - 100.0 fL    Comment: REPEATED TO VERIFY POST TRANSFUSION SPECIMEN    MCH 28.6 26.0 - 34.0 pg   MCHC 33.2 30.0 - 36.0 g/dL   RDW 19.0 (H) 11.5 - 15.5 %   Platelets 209 150 - 400 K/uL  Troponin I     Status: Abnormal   Collection Time: 06/24/16  5:55 AM  Result Value Ref Range   Troponin I 0.03 (HH) <0.03 ng/mL    Comment: CRITICAL RESULT CALLED TO, READ BACK BY AND VERIFIED WITH: S.MORTON,RN 0726 06/24/16 CLARK,S   Prepare RBC     Status: None   Collection Time: 06/24/16  8:48 AM  Result Value Ref Range   Order Confirmation ORDER PROCESSED BY BLOOD BANK   Renal function panel     Status: Abnormal   Collection Time: 06/24/16 10:07 AM  Result Value Ref Range   Sodium 139 135 - 145 mmol/L   Potassium 3.9 3.5 - 5.1 mmol/L   Chloride 103 101 - 111 mmol/L   CO2 26 22 - 32 mmol/L   Glucose, Bld 180 (H) 65 - 99 mg/dL   BUN 40 (H) 6 - 20 mg/dL   Creatinine, Ser 5.73 (H) 0.61 - 1.24 mg/dL   Calcium 7.3 (L) 8.9 - 10.3 mg/dL   Phosphorus 4.1 2.5 - 4.6 mg/dL   Albumin 2.7 (L) 3.5 - 5.0 g/dL   GFR calc non Af Amer 9 (L) >60 mL/min   GFR calc Af Amer 10 (L) >60 mL/min    Comment: (NOTE) The eGFR has been calculated using the CKD EPI equation. This calculation has not been validated in all clinical situations. eGFR's persistently <60 mL/min signify possible Chronic Kidney Disease.    Anion gap 10 5 - 15   Ct Head Wo Contrast  Result Date: 06/23/2016 CLINICAL DATA:  Initial evaluation for numbness with slurred speech EXAM: CT HEAD WITHOUT CONTRAST TECHNIQUE: Contiguous axial images were obtained from the base of the skull through the vertex without intravenous contrast. COMPARISON:  Recent MRI and  CT from 06/13/2016. FINDINGS: Brain: Stable atrophy with chronic microvascular ischemic disease. Superimposed remote lacunar infarcts present  within the right basal ganglia. Recently identified right thalamic infarct not well visualized. No acute intracranial hemorrhage. No evidence for new acute large vessel territory infarct. No mass lesion, midline shift or mass effect. No hydrocephalus. No extra-axial fluid collection. Vascular: No asymmetric hyper for dense vessel. Scattered vascular calcifications noted within the carotid siphons. Skull: Scalp soft tissues within normal limits.  Calvarium intact. Sinuses/Orbits: Globes normal soft tissues normal. Paranasal sinuses and mastoid air cells are clear. Other: None. IMPRESSION: 1. No acute intracranial process. Recently identified right thalamic lacunar infarct not well seen. 2. Stable atrophy with chronic microvascular ischemic disease. Multiple remote lacunar infarcts within the bilateral basal ganglia, stable. Electronically Signed   By: Jeannine Boga M.D.   On: 06/23/2016 17:37    Assessment:  1 ESRD(MWF) 2 GI bleed 3 Severe anemia due to # 2 4 Recent CVA  Plan: 1 Hemodialysis off schedule so PBCs can be given 2 Transfuse PBCs  Paula Zietz C 06/24/2016, 3:16 PM

## 2016-06-24 NOTE — Progress Notes (Signed)
Started 2nd unit of blood patient having no ill effects, will continue to monitor.

## 2016-06-24 NOTE — Progress Notes (Signed)
Pt received two units of PRBCs; tolerated the transfusion well no s/s of a rxn noted.

## 2016-06-24 NOTE — Progress Notes (Signed)
I have seen and assessed patient and agree with Dr. deficits assessment and plan. Patient is a pleasant 71 year old gentleman history of end-stage renal disease on hemodialysis, coronary artery disease, hypertension type 2 diabetes history of CVA admitted for severe anemia secondary to probable GI bleed. Patient does endorse ongoing melanotic stools. On admission patient noted to have a hemoglobin of 4.9. Patient transfused 2 units packed red blood cells hemoglobin of 6.9. Will transfuse 2 more units of packed red blood cells during hemodialysis. Nephrology consulted. GI consulted. Continue PPI.

## 2016-06-24 NOTE — H&P (Signed)
History and Physical  Patient Name: [name]     NIO:[id number]    DOB: 1946/02/11    DOA: 06/23/2016 PCP: [name], NP  Specialists:  Dr. Rolm Bookbinder, [facility]    Patient coming from: Dialysis  Chief Complaint: Weakness  HPI: [name] is a 71 y.o. 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  The patient was in his usual state of health until Sunday night when he developed feeling of generalized weakness. Then today during dialysis, he felt weak all over, trouble getting his thoughts together, and thought he had had a stroke and so came to the emergency room with his wife. There was globalized weakness, but no slurred speech, no focal weakness or numbness, no facial asymmetry.    ED course: -Afebrile, heart rate 99, respirations and pulse is normal, blood pressure 133/80 in stable -Na 140, K 3.5, Cr 4.38, WBC 5.4K, Hgb 4.9 down from 8.4 four days ago at his PCPs office -INR normal, PTT slightly elevated -Troponin negative -FOBT + and stool blackish -He was discussed with GI by phone, 2 units of blood were ordered and TRH were asked to evaluate for symptomatic anemia    The patient follows with Dr. Rolm Bookbinder from [facility].  He has a history of chronic AVMS and anemia of chronic GI blood loss superimposed on anemia of chronic renal disease, and is followed by Heme for his anemia, I believe Nephrology have taken over his iron infusions and Aranesp infusions.  Has been on Plavix for ~1 year for strokes.  With regard to recent bleeding, the patient denies hematochezia, epistaxis, hematuria, hemoptysis, but when asked about "black stools", states that he has those, but that he has had them for "6 months."  Per PCP notes, his Hgb has been trending down again, and his PCP recently referred  him for urgent GI follow up.    ROS: Review of Systems  HENT: Negative for nosebleeds.   Respiratory: Negative for hemoptysis.   Gastrointestinal: Positive for melena. Negative for abdominal pain, blood in stool, nausea and vomiting.  All other systems reviewed and are negative.         Past Medical History:  Diagnosis Date  . 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 Anemia, iron deficiency 03/24/2011   a. Recurrent GI bleed, tx with periodic iron infusions.  . 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 AVM (arteriovenous malformation) of duodenum, acquired    egds in 01/2012, 04/2011  . BPH (benign prostatic hyperplasia)   . CAD (coronary artery disease)    a. Cath 09/2012: moderate borderline CAD in mid LAD/small diagonal branch, mild RCA stenosis, to be managed medically   . Chronic lower back pain   . Diabetic peripheral neuropathy ([place]) 2014   foot pain.  . Fatty liver    on CT of 11/2010  . GERD (gastroesophageal reflux disease)   . GI bleed    a. Recurrent GI bleed, tx with IV iron. b. Per heme notes - likely AVMs 04/2012 (tx with cauterization several months ago).  . Hematuria    a. Urology note scan from 07/2012: cystoscopy without evidence for bladder lesion, only lateral hypertrophy of posterior urethra, bladder impression from BPH. b. Pt states he had "some tests" scheduled for later in July 2014.  [name] Kitchen  High cholesterol   . Hypertension   . 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 Orthostatic hypotension    a. Tx with florinef.  . Peripheral neuropathy ([place])   . Polyp, colonic    Colonoscopy 01/2012 "benign" polyp  . Prostate cancer ([place])   . Sleep apnea    "had mask; couldn't sleep in it" (09/20/2012)  . Smoker   . Stage III chronic kidney disease    a. Stage 3 (DM with complications ->CKD, peripheral neuropathy).  . Stroke ([place])    x 2  . Type II diabetes mellitus ([place])    a. Dx 1994, uncontrolled.     Past Surgical History:  Procedure  Laterality Date  . ENTEROSCOPY N/A 03/01/2013   Procedure: ENTEROSCOPY;  Surgeon: Inda Castle, MD;  Location: [facility];  Service: Endoscopy;  Laterality: N/A;  . GIVENS CAPSULE STUDY N/A 04/05/2015   Procedure: GIVENS CAPSULE STUDY;  Surgeon: Gatha Mayer, MD;  Location: [facility];  Service: Endoscopy;  Laterality: N/A;  . INGUINAL HERNIA REPAIR Right 2011  . LEFT HEART CATHETERIZATION WITH CORONARY ANGIOGRAM N/A 09/21/2012   Procedure: LEFT HEART CATHETERIZATION WITH CORONARY ANGIOGRAM;  Surgeon: Peter M Martinique, MD;  Location: [facility] CATH LAB;  Service: Cardiovascular;  Laterality: N/A;  . LUMBAR DISC SURGERY  1980's  . LYMPHADENECTOMY Bilateral 01/19/2013   Procedure: LYMPHADENECTOMY;  Surgeon: Bernestine Amass, MD;  Location: [facility];  Service: Urology;  Laterality: Bilateral;  . ROBOT ASSISTED LAPAROSCOPIC RADICAL PROSTATECTOMY N/A 01/19/2013   Procedure: ROBOTIC ASSISTED LAPAROSCOPIC RADICAL PROSTATECTOMY;  Surgeon: Bernestine Amass, MD;  Location: [facility];  Service: Urology;  Laterality: N/A;  . SHOULDER OPEN ROTATOR CUFF REPAIR Left 1980's    Social History: Patient lives with his wife.  The patient walks with a cane.  He is from [employer].  He was a Personal assistant.  He used to smoke.    No Known Allergies  Family history: family history includes Diabetes in his mother and sister; Heart attack in his brother and sister; Stomach cancer in his brother; Stroke in his father.  Prior to Admission medications   Medication Sig Start Date End Date Taking? Authorizing Provider  benzonatate (TESSALON) 100 MG capsule Take 1 capsule (100 mg total) by mouth 3 (three) times daily as needed for cough. 04/30/16  Yes Edward Saguier, PA-C  carvedilol (COREG) 3.125 MG tablet Take 1 tablet (3.125 mg total) by mouth 2 (two) times daily with a meal. 06/19/16  Yes Mackie Pai, PA-C  clopidogrel (PLAVIX) 75 MG tablet Take 1 tablet (75 mg total) by mouth daily. 06/19/16  Yes Edward Saguier, PA-C  gabapentin  (NEURONTIN) 300 MG capsule Take 1 capsule (300 mg total) by mouth 3 (three) times daily. Patient taking differently: Take 300 mg by mouth daily.  06/19/16  Yes Edward Saguier, PA-C  omeprazole (PRILOSEC) 40 MG capsule Take 1 capsule (40 mg total) by mouth daily. 06/19/16  Yes Edward Saguier, PA-C  rosuvastatin (CRESTOR) 40 MG tablet Take 1 tablet (40 mg total) by mouth daily. 06/19/16  Yes Mackie Pai, PA-C       Physical Exam: BP 140/80   Pulse 100   Temp 98.2 F (36.8 C) (Oral)   Resp 17   Ht 5\' 7"  (1.702 m)   Wt 63.5 kg (140 lb)   SpO2 99%   BMI 21.93 kg/m  General appearance: Well-developed, adult male, alert and in no acute distress, appears pleasantly confused.   Eyes: Anicteric, conjunctiva pink, lids and lashes  normal. PERRL.    ENT: No nasal deformity, discharge, epistaxis.  Hearing normal. OP moist without lesions.   Neck: No neck masses.  Trachea midline.  No thyromegaly/tenderness. Lymph: No cervical or supraclavicular lymphadenopathy. Skin: Warm and dry.  No suspicious rashes or lesions. Cardiac: RRR, nl S1-S2, no murmurs appreciated.  Capillary refill is brisk.  JVP normal.  No LE edema.  Radial and DP pulses 2+ and symmetric. Respiratory: Normal respiratory rate and rhythm.  CTAB without rales or wheezes. Abdomen: Abdomen soft.  No TTP. No ascites, distension, hepatosplenomegaly.   MSK: No deformities or effusions.  No cyanosis or clubbing. Neuro: Cranial nerves normal.  Sensation intact to light touch. Speech is fluent.  Muscle strength normal.    Psych: Sensorium intact and responding to questions, attention normal.  Behavior appropriate.  Affect normal.  Judgment and insight appear poor.     Labs on Admission:  I have personally reviewed following labs and imaging studies: CBC:  Recent Labs Lab 06/19/16 1035 06/23/16 1638 06/23/16 1654  WBC 5.9 5.4  --   NEUTROABS 4.3 3.7  --   HGB 8.4 Repeated and verified X2.* 4.9* 5.1*  HCT 24.6* 15.0* 15.0*  MCV 94.4  93.2  --   PLT 317.0 246  --    Basic Metabolic Panel:  Recent Labs Lab 06/19/16 1035 06/23/16 1638 06/23/16 1654  NA 139 140 141  K 3.9 3.5 3.6  CL 102 104 101  CO2 31 29  --   GLUCOSE 152* 161* 158*  BUN 16 29* 31*  CREATININE 5.36* 4.38* 4.60*  CALCIUM 8.0* 7.5*  --    GFR: Estimated Creatinine Clearance: 13.6 mL/min (A) (by C-G formula based on SCr of 4.6 mg/dL (H)).  Liver Function Tests:  Recent Labs Lab 06/19/16 1035 06/23/16 1638  AST 17 17  ALT 11 13*  ALKPHOS 112 94  BILITOT 0.3 0.3  PROT 6.9 5.7*  ALBUMIN 3.5 2.9*   No results for input(s): LIPASE, AMYLASE in the last 168 hours. No results for input(s): AMMONIA in the last 168 hours. Coagulation Profile:  Recent Labs Lab 06/23/16 1638  INR 1.08   Cardiac Enzymes: No results for input(s): CKTOTAL, CKMB, CKMBINDEX, TROPONINI in the last 168 hours. BNP (last 3 results) No results for input(s): PROBNP in the last 8760 hours. HbA1C: No results for input(s): HGBA1C in the last 72 hours. CBG:  Recent Labs Lab 06/23/16 1727  GLUCAP 167*   Lipid Profile: No results for input(s): CHOL, HDL, LDLCALC, TRIG, CHOLHDL, LDLDIRECT in the last 72 hours. Thyroid Function Tests: No results for input(s): TSH, T4TOTAL, FREET4, T3FREE, THYROIDAB in the last 72 hours. Anemia Panel: No results for input(s): VITAMINB12, FOLATE, FERRITIN, TIBC, IRON, RETICCTPCT in the last 72 hours. Sepsis Labs: Invalid input(s): PROCALCITONIN, LACTICIDVEN Recent Results (from the past 240 hour(s))  MRSA PCR Screening     Status: None   Collection Time: 06/14/16 10:51 AM  Result Value Ref Range Status   MRSA by PCR NEGATIVE NEGATIVE Final    Comment:        The GeneXpert MRSA Assay (FDA approved for NASAL specimens only), is one component of a comprehensive MRSA colonization surveillance program. It is not intended to diagnose MRSA infection nor to guide or monitor treatment for MRSA infections.           Radiological Exams on Admission: Personally reviewed CT head report: Ct Head Wo Contrast  Result Date: 06/23/2016 CLINICAL DATA:  Initial evaluation for numbness with slurred  speech EXAM: CT HEAD WITHOUT CONTRAST TECHNIQUE: Contiguous axial images were obtained from the base of the skull through the vertex without intravenous contrast. COMPARISON:  Recent MRI and CT from 06/13/2016. FINDINGS: Brain: Stable atrophy with chronic microvascular ischemic disease. Superimposed remote lacunar infarcts present within the right basal ganglia. Recently identified right thalamic infarct not well visualized. No acute intracranial hemorrhage. No evidence for new acute large vessel territory infarct. No mass lesion, midline shift or mass effect. No hydrocephalus. No extra-axial fluid collection. Vascular: No asymmetric hyper for dense vessel. Scattered vascular calcifications noted within the carotid siphons. Skull: Scalp soft tissues within normal limits.  Calvarium intact. Sinuses/Orbits: Globes normal soft tissues normal. Paranasal sinuses and mastoid air cells are clear. Other: None. IMPRESSION: 1. No acute intracranial process. Recently identified right thalamic lacunar infarct not well seen. 2. Stable atrophy with chronic microvascular ischemic disease. Multiple remote lacunar infarcts within the bilateral basal ganglia, stable. Electronically Signed   By: Jeannine Boga M.D.   On: 06/23/2016 17:37    EKG: Independently reviewed. Rate 99, QTc 503, anterolateral TW changes are old.  No ST changes.    Assessment/Plan  1. Symptomatic anemia:  He does have history of chronic GI blood loss anemia, but a relatively acute new symptoms of weakness for 2 days, in setting of Hgb with recent dramatic drop.  Wife did not report melena, but patient states he has had black stools, his report of chronicity is dubious, but I feel IV PPI is still warranted.   -IV Protonix BID -Hold Plavix -Clears only -Consult to  GI -Transfuse 2 units -Post-transfusion CBC ordered   2. Hypertension:  -Continue carvedilol  3. History of stroke:  -Hold Plavix given GIB -Continue statin  4. Other medications:  -PPI --> IV -Continue gabapentin  5. ECG changes:  No active chest pain, ACS doubted. -Repeat troponin in AM for delta  6. Diabetes: Not currently on medications, glucose normal.        DVT prophylaxis: SCDs  Code Status: FULL  Family Communication: None present  Disposition Plan: Anticipate IV PPI, GI consult.  Further disposition pending GI consultation. Consults called: GI Admission status: OBS At the point of initial evaluation, it is my clinical opinion that admission for OBSERVATION is reasonable and necessary because the patient's presenting complaints in the context of their chronic conditions represent sufficient risk of deterioration or significant morbidity to constitute reasonable grounds for close observation in the hospital setting, but that the patient may be medically stable for discharge from the hospital within 24 to 48 hours.    Medical decision making: Patient seen at 12:10 AM on 06/24/2016.  The patient was discussed with Dr. Vanita Panda.  What exists of the patient's chart was reviewed in depth as well as outside records from [facility] and summarized above.  Clinical condition: stable hemodynamically while in ER without gross bleeding.        Edwin Dada [facility] Pager [phone]

## 2016-06-24 NOTE — Consult Note (Signed)
[facility] Consult: 2:05 PM 06/24/2016  LOS: 0 days    Referring Provider: Dr Grandville Silos  Primary Care Physician:  Mackie Pai at [facility].  Primary Gastroenterologist:  [facility], Dr Rolm Bookbinder.  Previously Dr Valli Glance.     Reason for Consultation:  Anemia.     HPI: [name] is a 71 y.o. male.  PMH Anemia of chronic disease and from GI blood loss.  s/p multiple blood transfusions.  Previously treated with iron infusions (latest was 08/2015) supervised by Dr Marin Olp at [facility].  On ASA and Plavix for hx multiple CVAs, latest in 05/2015 "punctate ischemic stroke at right thalamus".  Elevated Troponisn during 05/2016 admission due to demand ischemia.  Apparently was not taking his meds. Type 2 DM, currently on no meds for this.  ESRD on HD MWF for about a year.  Aranesp discontinued in past due to non-hemorrhagic stoke.  CAD  Several years of transfusion requiring anemia. FOBT + black stools intermittently.   2014 Capsule endo: SB AVMs.  2014 Enteroscopy: with cauterization of "a few SB AVMs"  02/2015 Colonoscopy: 2 adenomatous polyps, internal hemorrhoids.  03/2015 Capsule endoscopy: several small, non-bleeding AVMs scattered from proximal to distal SB.  Plan was continue iron infusion, prn transfusion, monitor Hgb.  04/10/2016 Enteroscopy 30 cm into Jejunum. 56m AVM in the proximal jejunum, APC used to cauterize the lesion. The lesion started slowly oozing blood , more APC applications were done with excellent hemostasis. A 7 mm polyp was seen in the jejunum and biopsied (benign SB mucosa)  06/14/16 PRBC x 1 transfused during CVA admission. He recalls receiving at least 2 units of packed red blood cells during hemodialys, within the last few months.. He receives periodic parenteral iron at dialysis.  He isn't aware of receiving any Procrit,, mircera, epogen etc.  For at least a month he's been having the typically black, soft stools which she has 1 GI bleeding is active. [facility]  However, in the meantime he was admitted to [facility] admitted with weakness, mental fog.  He was worried worried he may be having another stroke. Hgb 4.9, was 8.4 four days previously.  Stool FOBT +.   s/p PRBC x 2: Hgb now 6.9.  Head CT repeated and no acute CVA noted. GI symptoms significant for some decreased appetite. Slight right lower quadrant abdominal discomfort in the region where he previously had inguinal hernia repair. No use of nonsteroidal anti-inflammatories other than the low-dose aspirin.    Past Medical History:  Diagnosis Date  . 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 Anemia, iron deficiency 03/24/2011   a. Recurrent GI bleed, tx with periodic iron infusions.  . 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 AVM (arteriovenous malformation) of duodenum, acquired    egds in 01/2012, 04/2011  . BPH (benign prostatic hyperplasia)   . CAD (coronary artery disease)    a. Cath 09/2012: moderate borderline CAD in mid LAD/small diagonal branch, mild RCA stenosis, to be managed medically   .  Chronic lower back pain   . Diabetic peripheral neuropathy ([place]) 2014   foot pain.  . Fatty liver    on CT of 11/2010  . GERD (gastroesophageal reflux disease)   . GI bleed    a. Recurrent GI bleed, tx with IV iron. b. Per heme notes - likely AVMs 04/2012 (tx with cauterization several months ago).  . Hematuria    a. Urology note scan from 07/2012: cystoscopy without evidence for bladder lesion, only lateral hypertrophy of posterior urethra, bladder impression from BPH. b. Pt states he had "some tests" scheduled for later in July 2014.  . High cholesterol   . Hypertension   . 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 Orthostatic hypotension    a. Tx with  florinef.  . Peripheral neuropathy ([place])   . Polyp, colonic    Colonoscopy 01/2012 "benign" polyp  . Prostate cancer ([place])   . Sleep apnea    "had mask; couldn't sleep in it" (09/20/2012)  . Smoker   . Stage III chronic kidney disease    a. Stage 3 (DM with complications ->CKD, peripheral neuropathy).  . Stroke ([place])    x 2  . Type II diabetes mellitus ([place])    a. Dx 1994, uncontrolled.     Past Surgical History:  Procedure Laterality Date  . ENTEROSCOPY N/A 03/01/2013   Procedure: ENTEROSCOPY;  Surgeon: Inda Castle, MD;  Location: [place];  Service: Endoscopy;  Laterality: N/A;  . GIVENS CAPSULE STUDY N/A 04/05/2015   Procedure: GIVENS CAPSULE STUDY;  Surgeon: Gatha Mayer, MD;  Location: [place];  Service: Endoscopy;  Laterality: N/A;  . INGUINAL HERNIA REPAIR Right 2011  . LEFT HEART CATHETERIZATION WITH CORONARY ANGIOGRAM N/A 09/21/2012   Procedure: LEFT HEART CATHETERIZATION WITH CORONARY ANGIOGRAM;  Surgeon: Peter M Martinique, MD;  Location: [facility] CATH [facility];  Service: Cardiovascular;  Laterality: N/A;  . LUMBAR DISC SURGERY  1980's  . LYMPHADENECTOMY Bilateral 01/19/2013   Procedure: LYMPHADENECTOMY;  Surgeon: Bernestine Amass, MD;  Location: [facility];  Service: Urology;  Laterality: Bilateral;  . ROBOT ASSISTED LAPAROSCOPIC RADICAL PROSTATECTOMY N/A 01/19/2013   Procedure: ROBOTIC ASSISTED LAPAROSCOPIC RADICAL PROSTATECTOMY;  Surgeon: Bernestine Amass, MD;  Location: [facility];  Service: Urology;  Laterality: N/A;  . SHOULDER OPEN ROTATOR CUFF REPAIR Left 1980's    Prior to Admission medications   Medication Sig Start Date End Date Taking? Authorizing Provider  benzonatate (TESSALON) 100 MG capsule Take 1 capsule (100 mg total) by mouth 3 (three) times daily as needed for cough. 04/30/16  Yes Edward Saguier, PA-C  carvedilol (COREG) 3.125 MG tablet Take 1 tablet (3.125 mg total) by mouth 2 (two) times daily with a meal. 06/19/16  Yes Mackie Pai, PA-C  clopidogrel (PLAVIX) 75 MG  tablet Take 1 tablet (75 mg total) by mouth daily. 06/19/16  Yes Edward Saguier, PA-C  gabapentin (NEURONTIN) 300 MG capsule Take 1 capsule (300 mg total) by mouth 3 (three) times daily. Patient taking differently: Take 300 mg by mouth daily.  06/19/16  Yes Edward Saguier, PA-C  omeprazole (PRILOSEC) 40 MG capsule Take 1 capsule (40 mg total) by mouth daily. 06/19/16  Yes Edward Saguier, PA-C  rosuvastatin (CRESTOR) 40 MG tablet Take 1 tablet (40 mg total) by mouth daily. 06/19/16  Yes Edward Saguier, PA-C    Scheduled Meds: . sodium chloride   Intravenous Once  . carvedilol  3.125 mg Oral BID WC  . gabapentin  300 mg Oral Daily  . pantoprazole  40 mg Intravenous Q12H  . rosuvastatin  40 mg Oral q1800   Infusions:  PRN Meds:    Allergies as of 06/23/2016  . (No Known Allergies)    Family History  Problem Relation Age of Onset  . Stroke Father     Died at 44  . Diabetes Mother   . Heart attack Brother     Died at 54  . Diabetes Sister   . Stomach cancer Brother   . Heart attack Sister     Social History   Social History  . Marital status: Married    Spouse name: N/A  . Number of children: 2  . Years of education: N/A   Occupational History  . taken out of work due to back    Social History Main Topics  . Smoking status: Current Some Day Smoker    Packs/day: 1.00    Years: 43.00    Types: Cigarettes    Start date: 04/15/1968  . Smokeless tobacco: Never Used     Comment: 07-22--2016  still smoking  . Alcohol use No     Comment: 09/20/2012 "Used to; stopped ~ 2009; never had problem w/it"  . Drug use: No  . Sexual activity: No   Other Topics Concern  . Not on file   Social History Narrative   Regular exercise: rides horses   Caffeine use: occasionally    REVIEW OF SYSTEMS: Constitutional:  Weakness, fatigue, these generally improve after transfusions. ENT:  No nose bleeds Pulm:  DOE. No cough. With smoking cigarettes a long time ago. CV:  No palpitations, no  LE edema. No chest pain GU:  No hematuria, no frequency.  Still makes a fair amount of urine despite his kidney failure GI:  Per HPI Heme:  He has not seen any tendency towards excessive bleeding or bruising.   Transfusions:  Per HPI Neuro:  No headaches, no peripheral tingling or numbness Derm:  No itching, no rash or sores.  Endocrine:  No sweats or chills.  No polyuria or dysuria Immunization:  Not queried Travel:  None beyond local counties in last few months.    PHYSICAL EXAM: Vital signs in last 24 hours: Vitals:   06/24/16 0442 06/24/16 0907  BP: (!) 150/86 (!) 150/79  Pulse: (!) 101 97  Resp: 16 15  Temp: 98.1 F (36.7 C) 98.7 F (37.1 C)   Wt Readings from Last 3 Encounters:  06/24/16 64 kg (141 lb 1.6 oz)  06/19/16 63 kg (138 lb 12.8 oz)  06/13/16 62 kg (136 lb 11.2 oz)    General: Pleasant, comfortable. He actually looks very well. He looks younger than his stated age of 31. Head:  No facial swelling, asymmetry or signs of head trauma.  Eyes:  No scleral icterus, no conjunctival pallor. Ears:  Not hard of hearing.  Nose:  No congestion or discharge. Mouth:  Moist, clear oral mucosa. Tongue midline. Neck:  No JVD, no TMG, no masses. Lungs:  Clear bilaterally. No cough or labored breathing. Heart: RRR. No MRG. S1, S2 present. Abdomen:  Not tender. Soft. There is a slight bulge, possibly hernia in the right inguinal region.   Rectal: Deferred rectal exam. Patient is in the hemodialysis unit currently.   Musc/Skeltl: No gross joint deformities or swelling Extremities:  No CCE.  Neurologic:  Alert. Speech clear and fluent. Oriented times 3. Moves all 4 limbs without obvious hemi-weakness or deficits. No tremor. Skin:  No rashes, no sores Tattoos:  None seen  Nodes:  No cervical adenopathy.   Psych:  Pleasant, calm, cooperative.  Intake/Output from previous day: 04/09 0701 - 04/10 0700 In: 1045 [I.V.:20; Blood:1005] Out: -  Intake/Output this shift: Total  I/O In: 480 [P.O.:480] Out: 100 [Urine:100]  LAB RESULTS:  Recent Labs  06/23/16 1638 06/23/16 1654 06/24/16 0555  WBC 5.4  --  6.2  HGB 4.9* 5.1* 6.9*  HCT 15.0* 15.0* 20.8*  PLT 246  --  209   BMET Lab Results  Component Value Date   NA 139 06/24/2016   NA 141 06/23/2016   NA 140 06/23/2016   K 3.9 06/24/2016   K 3.6 06/23/2016   K 3.5 06/23/2016   CL 103 06/24/2016   CL 101 06/23/2016   CL 104 06/23/2016   CO2 26 06/24/2016   CO2 29 06/23/2016   CO2 31 06/19/2016   GLUCOSE 180 (H) 06/24/2016   GLUCOSE 158 (H) 06/23/2016   GLUCOSE 161 (H) 06/23/2016   BUN 40 (H) 06/24/2016   BUN 31 (H) 06/23/2016   BUN 29 (H) 06/23/2016   CREATININE 5.73 (H) 06/24/2016   CREATININE 4.60 (H) 06/23/2016   CREATININE 4.38 (H) 06/23/2016   CALCIUM 7.3 (L) 06/24/2016   CALCIUM 7.5 (L) 06/23/2016   CALCIUM 8.0 (L) 06/19/2016   LFT  Recent Labs  06/23/16 1638 06/24/16 1007  PROT 5.7*  --   ALBUMIN 2.9* 2.7*  AST 17  --   ALT 13*  --   ALKPHOS 94  --   BILITOT 0.3  --    PT/INR Lab Results  Component Value Date   INR 1.08 06/23/2016   INR 1.06 06/13/2016   INR 0.99 03/01/2013   Hepatitis Panel No results for input(s): HEPBSAG, HCVAB, HEPAIGM, HEPBIGM in the last 72 hours. C-Diff No components found for: CDIFF Lipase  No results found for: LIPASE  Drugs of Abuse  No results found for: LABOPIA, COCAINSCRNUR, LABBENZ, AMPHETMU, THCU, LABBARB   RADIOLOGY STUDIES: Ct Head Wo Contrast  Result Date: 06/23/2016 CLINICAL DATA:  Initial evaluation for numbness with slurred speech EXAM: CT HEAD WITHOUT CONTRAST TECHNIQUE: Contiguous axial images were obtained from the base of the skull through the vertex without intravenous contrast. COMPARISON:  Recent MRI and CT from 06/13/2016. FINDINGS: Brain: Stable atrophy with chronic microvascular ischemic disease. Superimposed remote lacunar infarcts present within the right basal ganglia. Recently identified right thalamic infarct  not well visualized. No acute intracranial hemorrhage. No evidence for new acute large vessel territory infarct. No mass lesion, midline shift or mass effect. No hydrocephalus. No extra-axial fluid collection. Vascular: No asymmetric hyper for dense vessel. Scattered vascular calcifications noted within the carotid siphons. Skull: Scalp soft tissues within normal limits.  Calvarium intact. Sinuses/Orbits: Globes normal soft tissues normal. Paranasal sinuses and mastoid air cells are clear. Other: None. IMPRESSION: 1. No acute intracranial process. Recently identified right thalamic lacunar infarct not well seen. 2. Stable atrophy with chronic microvascular ischemic disease. Multiple remote lacunar infarcts within the bilateral basal ganglia, stable. Electronically Signed   By: Jeannine Boga M.D.   On: 06/23/2016 17:37     IMPRESSION:   *  Acute on chronic anemia.  Hemoglobin improved following transfusion with 2 units PRBCs.  Black stools for about a month, FOBT positive. This scenario insistent with previous GI bleeding from known small bowel AVMs. Has had multiple endoscopic evaluations. The latest was January 2018 when he had enteroscopy with ablation of AVM in the proximal jejunal and a benign jejunal polyp removed.  It appears that he is not a candidate for Epogen etc due to his CVA history. He has been receiving fairly frequent transfusions, has had 3 units of packed cells in the last few months and transfusion requirements date back several years. He was due to see his GI doctor in [facility] today but because of this admission he was unable to make this appointment...    PLAN:     *  Will discuss with Dr. Ardis Hughs.   *  Patient has only been allowed clear liquid diet today. We will advance diet to renal/diabetic type.  *  Stop the IV, BID, Protonix. Restart his home dose of daily oral Protonix. Bleeding from AVMs is not affected by acid control    Azucena Freed  06/24/2016, 2:05  PM Pager: [phone]  ________________________________________________________________________  [facility] Heckler [facility] MD note:  I personally examined the patient, reviewed the data and agree with the assessment and plan described above.   Owens Loffler, MD [facility] Pager [phone]   ________________________________________________________________________  Vel[facility] Heckler GI MD note:  I personally examined the patient, reviewed the data and agree with the assessment and plan described above.  ACute on chronic Gi bleeding.  Care usually through [facility].  Last upper endoscopy 2-3 months ago.  Seems like he needs another one now.  Would be great if he could be on only one blood thinner (ASA or plavix instead of both).  Ideal would be no blood thinners and then I would suspect he would have no further GI bleeding issues.  Unfortunately he tends to have strokes and so it is not feasible for him to come off all blood thinners.  Will plan on Enteroscopy during his admission. Need anesthesia assistance with MAC sedation and that is not available until Thursday.     Owens Loffler, MD [facility] Pager [phone]

## 2016-06-24 NOTE — Care Management Note (Signed)
Case Management Note  Patient Details  Name: [name] MRN: [id number] Date of Birth: August 29, 1945  Subjective/Objective:   Pt in with Hgb of 4.9 and dark stools. Pt is ESRD and goes to dialysis on M,W,F. He is from home with his spouse. Pt was active with [facility] RN/PT/OT prior to hospitalization.                 Action/Plan: Plan is for patient to return home when medically stable. CM following for d/c needs, physician orders.   Expected Discharge Date:                  Expected Discharge Plan:  Home/Self Care  In-House Referral:     Discharge planning Services     Post Acute Care Choice:    Choice offered to:     DME Arranged:    DME Agency:     HH Arranged:    HH Agency:     Status of Service:  In process, will continue to follow  If discussed at Long Length of Stay Meetings, dates discussed:    Additional Comments:  [name], RN 06/24/2016, 1:36 PM

## 2016-06-24 NOTE — Progress Notes (Signed)
Lab reported critical value. Troponin 0.03. RN paged Dr. Grandville Silos at [phone].

## 2016-06-24 NOTE — Progress Notes (Signed)
Pt arrived about 0150 alert and oriented small facial droop, no slurred speech at this time, still needs 1 unit of blood transfused, Q 2 neuro checks and vitals.

## 2016-06-25 ENCOUNTER — Encounter: Payer: Self-pay | Admitting: Neurology

## 2016-06-25 DIAGNOSIS — I10 Essential (primary) hypertension: Secondary | ICD-10-CM

## 2016-06-25 DIAGNOSIS — D649 Anemia, unspecified: Secondary | ICD-10-CM

## 2016-06-25 DIAGNOSIS — N186 End stage renal disease: Secondary | ICD-10-CM

## 2016-06-25 LAB — TYPE AND SCREEN
ABO/RH(D): AB POS
ANTIBODY SCREEN: NEGATIVE
UNIT DIVISION: 0
UNIT DIVISION: 0
Unit division: 0
Unit division: 0
Unit division: 0

## 2016-06-25 LAB — CBC
HCT: 27.3 % — ABNORMAL LOW (ref 39.0–52.0)
HCT: 27.7 % — ABNORMAL LOW (ref 39.0–52.0)
HEMOGLOBIN: 9.5 g/dL — AB (ref 13.0–17.0)
Hemoglobin: 9.1 g/dL — ABNORMAL LOW (ref 13.0–17.0)
MCH: 28.4 pg (ref 26.0–34.0)
MCH: 27.3 pg (ref 26.0–34.0)
MCHC: 34.8 g/dL (ref 30.0–36.0)
MCHC: 32.9 g/dL (ref 30.0–36.0)
MCV: 81.5 fL (ref 78.0–100.0)
MCV: 83.2 fL (ref 78.0–100.0)
PLATELETS: 178 10*3/uL (ref 150–400)
Platelets: 185 10*3/uL (ref 150–400)
RBC: 3.35 MIL/uL — AB (ref 4.22–5.81)
RBC: 3.33 MIL/uL — ABNORMAL LOW (ref 4.22–5.81)
RDW: 20.7 % — ABNORMAL HIGH (ref 11.5–15.5)
RDW: 20.2 % — AB (ref 11.5–15.5)
WBC: 6.8 10*3/uL (ref 4.0–10.5)
WBC: 6.7 10*3/uL (ref 4.0–10.5)

## 2016-06-25 LAB — RENAL FUNCTION PANEL
ANION GAP: 10 (ref 5–15)
Albumin: 2.6 g/dL — ABNORMAL LOW (ref 3.5–5.0)
Albumin: 2.7 g/dL — ABNORMAL LOW (ref 3.5–5.0)
Anion gap: 11 (ref 5–15)
BUN: 15 mg/dL (ref 6–20)
BUN: 28 mg/dL — AB (ref 6–20)
CALCIUM: 7.5 mg/dL — AB (ref 8.9–10.3)
CHLORIDE: 97 mmol/L — AB (ref 101–111)
CO2: 28 mmol/L (ref 22–32)
CO2: 28 mmol/L (ref 22–32)
CREATININE: 3.39 mg/dL — AB (ref 0.61–1.24)
CREATININE: 5.13 mg/dL — AB (ref 0.61–1.24)
Calcium: 7.4 mg/dL — ABNORMAL LOW (ref 8.9–10.3)
Chloride: 98 mmol/L — ABNORMAL LOW (ref 101–111)
GFR calc Af Amer: 12 mL/min — ABNORMAL LOW (ref >60)
GFR calc non Af Amer: 17 mL/min — ABNORMAL LOW (ref >60)
GFR calc non Af Amer: 10 mL/min — ABNORMAL LOW (ref >60)
GFR, EST AFRICAN AMERICAN: 20 mL/min — AB (ref >60)
Glucose, Bld: 97 mg/dL (ref 65–99)
Glucose, Bld: 142 mg/dL — ABNORMAL HIGH (ref 65–99)
Phosphorus: 3.4 mg/dL (ref 2.5–4.6)
Phosphorus: 4 mg/dL (ref 2.5–4.6)
Potassium: 3.4 mmol/L — ABNORMAL LOW (ref 3.5–5.1)
Potassium: 3.9 mmol/L (ref 3.5–5.1)
SODIUM: 136 mmol/L (ref 135–145)
SODIUM: 136 mmol/L (ref 135–145)

## 2016-06-25 LAB — BPAM RBC
BLOOD PRODUCT EXPIRATION DATE: 201804222359
BLOOD PRODUCT EXPIRATION DATE: 201804302359
Blood Product Expiration Date: 201804262359
Blood Product Expiration Date: 201804272359
Blood Product Expiration Date: 201804272359
ISSUE DATE / TIME: 201804101546
ISSUE DATE / TIME: 201804092114
ISSUE DATE / TIME: 201804100239
ISSUE DATE / TIME: 201804101546
UNIT TYPE AND RH: 6200
Unit Type and Rh: 8400
Unit Type and Rh: 8400
Unit Type and Rh: 8400
Unit Type and Rh: 8400

## 2016-06-25 LAB — HEPATITIS B SURFACE ANTIGEN: Hepatitis B Surface Ag: NEGATIVE

## 2016-06-25 LAB — SURGICAL PCR SCREEN
MRSA, PCR: NEGATIVE
STAPHYLOCOCCUS AUREUS: NEGATIVE

## 2016-06-25 MED ORDER — HEPARIN SODIUM (PORCINE) 1000 UNIT/ML DIALYSIS
1000.0000 [IU] | INTRAMUSCULAR | Status: DC | PRN
  Filled 2016-06-25: qty 1

## 2016-06-25 MED ORDER — ALTEPLASE 2 MG IJ SOLR
2.0000 mg | Freq: Once | INTRAMUSCULAR | Status: DC | PRN
  Filled 2016-06-25: qty 2

## 2016-06-25 MED ORDER — PENTAFLUOROPROP-TETRAFLUOROETH EX AERO: 1.0000 "application " | INHALATION_SPRAY | CUTANEOUS | Status: DC | PRN

## 2016-06-25 MED ORDER — SODIUM CHLORIDE 0.9 % IV SOLN: 100.0000 mL | INTRAVENOUS | Status: DC | PRN

## 2016-06-25 MED ORDER — LIDOCAINE-PRILOCAINE 2.5-2.5 % EX CREA
1.0000 "application " | TOPICAL_CREAM | CUTANEOUS | Status: DC | PRN
  Filled 2016-06-25: qty 5

## 2016-06-25 MED ORDER — LIDOCAINE HCL (PF) 1 % IJ SOLN: 5.0000 mL | INTRAMUSCULAR | Status: DC | PRN

## 2016-06-25 NOTE — Progress Notes (Signed)
Daily Rounding Note  06/25/2016, 9:29 AM  LOS: 1 day   SUBJECTIVE:   Chief complaint: black stools    Stool this AM: still formed and black, same as he's had for the last few weeks.    OBJECTIVE:         Vital signs in last 24 hours:    Temp:  [97.4 F (36.3 C)-98.7 F (37.1 C)] 98.6 F (37 C) (04/11 0846) Pulse Rate:  [77-95] 93 (04/11 0846) Resp:  [15-18] 15 (04/11 0846) BP: (139-181)/(72-99) 153/85 (04/11 0846) SpO2:  [96 %-100 %] 98 % (04/11 0846) Weight:  [63.5 kg (139 lb 15.9 oz)-63.9 kg (140 lb 14 oz)] 63.5 kg (139 lb 15.9 oz) (04/10 1745) Last BM Date: 06/23/16 Filed Weights   06/24/16 0204 06/24/16 1332 06/24/16 1745  Weight: 64 kg (141 lb 1.6 oz) 63.9 kg (140 lb 14 oz) 63.5 kg (139 lb 15.9 oz)   General: pleasant, looks well.  comfortable   Heart: RRR Chest: clear bil.   Abdomen: soft, NT, ND.  Active BS  Extremities: no CCE.   Neuro/Psych:  Oriented x 3.  No gross deficits.    Lab Results:  Recent Labs  06/23/16 1638 06/23/16 1654 06/24/16 0555 06/25/16 0433  WBC 5.4  --  6.2 6.8  HGB 4.9* 5.1* 6.9* 9.5*  HCT 15.0* 15.0* 20.8* 27.3*  PLT 246  --  209 178   BMET  Recent Labs  06/23/16 1638 06/23/16 1654 06/24/16 1007 06/25/16 0433  NA 140 141 139 136  K 3.5 3.6 3.9 3.4*  CL 104 101 103 98*  CO2 29  --  26 28  GLUCOSE 161* 158* 180* 97  BUN 29* 31* 40* 15  CREATININE 4.38* 4.60* 5.73* 3.39*  CALCIUM 7.5*  --  7.3* 7.5*   LFT  Recent Labs  06/23/16 1638 06/24/16 1007 06/25/16 0433  PROT 5.7*  --   --   ALBUMIN 2.9* 2.7* 2.6*  AST 17  --   --   ALT 13*  --   --   ALKPHOS 94  --   --   BILITOT 0.3  --   --    PT/INR  Recent Labs  06/23/16 1638  LABPROT 14.1  INR 1.08   Hepatitis Panel  Recent Labs  06/24/16 1349  HEPBSAG Negative    Studies/Results: Ct Head Wo Contrast  Result Date: 06/23/2016 CLINICAL DATA:  Initial evaluation for numbness with slurred  speech EXAM: CT HEAD WITHOUT CONTRAST TECHNIQUE: Contiguous axial images were obtained from the base of the skull through the vertex without intravenous contrast. COMPARISON:  Recent MRI and CT from 06/13/2016. FINDINGS: Brain: Stable atrophy with chronic microvascular ischemic disease. Superimposed remote lacunar infarcts present within the right basal ganglia. Recently identified right thalamic infarct not well visualized. No acute intracranial hemorrhage. No evidence for new acute large vessel territory infarct. No mass lesion, midline shift or mass effect. No hydrocephalus. No extra-axial fluid collection. Vascular: No asymmetric hyper for dense vessel. Scattered vascular calcifications noted within the carotid siphons. Skull: Scalp soft tissues within normal limits.  Calvarium intact. Sinuses/Orbits: Globes normal soft tissues normal. Paranasal sinuses and mastoid air cells are clear. Other: None. IMPRESSION: 1. No acute intracranial process. Recently identified right thalamic lacunar infarct not well seen. 2. Stable atrophy with chronic microvascular ischemic disease. Multiple remote lacunar infarcts within the bilateral basal ganglia, stable. Electronically Signed   By: Pincus Badder.D.  On: 06/23/2016 17:37   Scheduled Meds: . sodium chloride   Intravenous Once  . carvedilol  3.125 mg Oral BID WC  . gabapentin  300 mg Oral Daily  . pantoprazole  40 mg Oral Q0600  . rosuvastatin  40 mg Oral q1800   Continuous Infusions: PRN Meds:.   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  Frequent blood transfusions, parenteral iron overseen by dialysis MDs.   *  ESRD.   HD MWF but dialyzed as inpt yesterday (Tuesday).   PLAN   *  Enteroscopy is on for 4/12 at 8 AM.  NPO after midnight. Pt agreeable.    Azucena Freed  06/25/2016, 9:29 AM Pager:  [phone]   ________________________________________________________________________  Vel[facility] Heckler GI MD note:  I personally examined the patient, reviewed the data and agree with the assessment and plan described above.  Enteroscopy tomorrow AM.   Owens Loffler, MD [facility] Pager [phone]

## 2016-06-25 NOTE — Progress Notes (Signed)
Assessment:  1 ESRD(MWF) 2 GI bleed w/ hx of AVMs 3 Severe anemia due to # 2 4 Recent CVA  Plan: 1 HD MWF or as needed  Subjective: Interval History: tol HD yesterday  Objective: Vital signs in last 24 hours: Temp:  [97.9 F (36.6 C)-98.7 F (37.1 C)] 98 F (36.7 C) (04/11 1416) Pulse Rate:  [77-95] 88 (04/11 1416) Resp:  [15-18] 18 (04/11 1416) BP: (142-181)/(72-99) 142/78 (04/11 1416) SpO2:  [96 %-99 %] 98 % (04/11 1416) Weight:  [63.5 kg (139 lb 15.9 oz)] 63.5 kg (139 lb 15.9 oz) (04/10 1745) Weight change: 0.396 kg (14 oz)  Intake/Output from previous day: 04/10 0701 - 04/11 0700 In: 1150 [P.O.:480; Blood:670] Out: 100 [Urine:100] Intake/Output this shift: No intake/output data recorded.  General appearance: alert and cooperative Resp: clear to auscultation bilaterally Chest wall: no tenderness Cardio: regular rate and rhythm, S1, S2 normal, no murmur, click, rub or gallop GI: soft, non-tender; bowel sounds normal; no masses,  no organomegaly  TDC right chest AVF LUE  Lab Results:  Recent Labs  06/24/16 0555 06/25/16 0433  WBC 6.2 6.8  HGB 6.9* 9.5*  HCT 20.8* 27.3*  PLT 209 178   BMET:  Recent Labs  06/24/16 1007 06/25/16 0433  NA 139 136  K 3.9 3.4*  CL 103 98*  CO2 26 28  GLUCOSE 180* 97  BUN 40* 15  CREATININE 5.73* 3.39*  CALCIUM 7.3* 7.5*   No results for input(s): PTH in the last 72 hours. Iron Studies: No results for input(s): IRON, TIBC, TRANSFERRIN, FERRITIN in the last 72 hours. Studies/Results: Ct Head Wo Contrast  Result Date: 06/23/2016 CLINICAL DATA:  Initial evaluation for numbness with slurred speech EXAM: CT HEAD WITHOUT CONTRAST TECHNIQUE: Contiguous axial images were obtained from the base of the skull through the vertex without intravenous contrast. COMPARISON:  Recent MRI and CT from 06/13/2016. FINDINGS: Brain: Stable atrophy with chronic microvascular ischemic disease. Superimposed remote lacunar infarcts present  within the right basal ganglia. Recently identified right thalamic infarct not well visualized. No acute intracranial hemorrhage. No evidence for new acute large vessel territory infarct. No mass lesion, midline shift or mass effect. No hydrocephalus. No extra-axial fluid collection. Vascular: No asymmetric hyper for dense vessel. Scattered vascular calcifications noted within the carotid siphons. Skull: Scalp soft tissues within normal limits.  Calvarium intact. Sinuses/Orbits: Globes normal soft tissues normal. Paranasal sinuses and mastoid air cells are clear. Other: None. IMPRESSION: 1. No acute intracranial process. Recently identified right thalamic lacunar infarct not well seen. 2. Stable atrophy with chronic microvascular ischemic disease. Multiple remote lacunar infarcts within the bilateral basal ganglia, stable. Electronically Signed   By: Jeannine Boga M.D.   On: 06/23/2016 17:37    Scheduled: . sodium chloride   Intravenous Once  . carvedilol  3.125 mg Oral BID WC  . gabapentin  300 mg Oral Daily  . pantoprazole  40 mg Oral Q0600  . rosuvastatin  40 mg Oral q1800     LOS: 1 day   Legend Pecore C 06/25/2016,4:28 PM

## 2016-06-25 NOTE — Progress Notes (Signed)
PROGRESS NOTE    [name]  TKW:[id number] DOB: [birth year] DOA: 06/23/2016 PCP: [name], NP   Brief Narrative: 71 y.o. 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 In the ER, patient was found to have hemoglobin of 4.9 which was down from 8.4 which was about 4 days prior to admission at PCPs office. Patient reported a black stool and fecal occult blood test positive. Evaluated by GI.   Assessment & Plan:  # Symptomatic anemia: Multifactorial etiology including acute blood loss from GI bleed and anemia of chronic kidney disease: -Pt received  about 4 units of red blood cell transfusion with improvement in hemoglobin. GI evaluation ongoing. Plan for endoscopy tomorrow. -Continue to monitor. -continue PPI  #ESRD on hemodialysis: Nephrology following for hemodialysis management. Monitor blood pressure, electrolytes.  #Hypertension: Continue Coreg. Monitor blood pressure.  DVT prophylaxis: SCD Code Status: Full code Family Communication: No family present at bedside Disposition Plan: Likely discharge home in 1-2 days    Consultants:   GI  Nephrologist  Procedures:plan for egd Antimicrobials:none  Subjective: Patient was seen and examined at bedside. He reported doing well. Denied headache, dizziness, nausea, vomiting, chest pain or shortness of breath.  Objective: Vitals:   06/25/16 0224 06/25/16 0651 06/25/16 0846 06/25/16 1416  BP: (!) 152/85 (!) 144/72 (!) 153/85 (!) 142/78  Pulse: 95 90 93 88  Resp: 18 18 15 18   Temp: 98.7 F (37.1 C) 98.3 F (36.8 C) 98.6 F (37 C) 98 F (36.7 C)  TempSrc: Oral Oral Oral Oral  SpO2: 96% 99% 98% 98%  Weight:      Height:        Intake/Output Summary (Last 24 hours) at 06/25/16 1503 Last data filed at 06/24/16 1745  Gross per 24 hour    Intake              670 ml  Output                0 ml  Net              670 ml   Filed Weights   06/24/16 0204 06/24/16 1332 06/24/16 1745  Weight: 64 kg (141 lb 1.6 oz) 63.9 kg (140 lb 14 oz) 63.5 kg (139 lb 15.9 oz)    Examination:  General exam: Appears calm and comfortable  Respiratory system: Clear to auscultation. Respiratory effort normal. No wheezing or crackle Cardiovascular system: S1 & S2 heard, RRR.  No pedal edema. Gastrointestinal system: Abdomen is nondistended, soft and nontender. Normal bowel sounds heard. Central nervous system: Alert and oriented. No focal neurological deficits. Extremities: Symmetric 5 x 5 power. Skin: No rashes, lesions or ulcers Psychiatry: Judgement and insight appear normal. Mood & affect appropriate.     Data Reviewed: I have personally reviewed following labs and imaging studies  CBC:  Recent Labs Lab 06/19/16 1035 06/23/16 1638 06/23/16 1654 06/24/16 0555 06/25/16 0433  WBC 5.9 5.4  --  6.2 6.8  NEUTROABS 4.3 3.7  --   --   --   HGB 8.4 Repeated and verified X2.* 4.9* 5.1* 6.9* 9.5*  HCT 24.6* 15.0* 15.0* 20.8* 27.3*  MCV 94.4 93.2  --  86.3 81.5  PLT 317.0 246  --  209 924   Basic Metabolic Panel:  Recent Labs Lab 06/19/16 1035  06/23/16 1638 06/23/16 1654 06/24/16 1007 06/25/16 0433  NA 139 140 141 139 136  K 3.9 3.5 3.6 3.9 3.4*  CL 102 104 101 103 98*  CO2 31 29  --  26 28  GLUCOSE 152* 161* 158* 180* 97  BUN 16 29* 31* 40* 15  CREATININE 5.36* 4.38* 4.60* 5.73* 3.39*  CALCIUM 8.0* 7.5*  --  7.3* 7.5*  PHOS  --   --   --  4.1 3.4   GFR: Estimated Creatinine Clearance: 18.5 mL/min (A) (by C-G formula based on SCr of 3.39 mg/dL (H)). Liver Function Tests:  Recent Labs Lab 06/19/16 1035 06/23/16 1638 06/24/16 1007 06/25/16 0433  AST 17 17  --   --   ALT 11 13*  --   --   ALKPHOS 112 94  --   --   BILITOT 0.3 0.3  --   --   PROT 6.9 5.7*  --   --   ALBUMIN 3.5 2.9* 2.7* 2.6*   No results for  input(s): LIPASE, AMYLASE in the last 168 hours. No results for input(s): AMMONIA in the last 168 hours. Coagulation Profile:  Recent Labs Lab 06/23/16 1638  INR 1.08   Cardiac Enzymes:  Recent Labs Lab 06/24/16 0555  TROPONINI 0.03*   BNP (last 3 results) No results for input(s): PROBNP in the last 8760 hours. HbA1C: No results for input(s): HGBA1C in the last 72 hours. CBG:  Recent Labs Lab 06/23/16 1727  GLUCAP 167*   Lipid Profile: No results for input(s): CHOL, HDL, LDLCALC, TRIG, CHOLHDL, LDLDIRECT in the last 72 hours. Thyroid Function Tests: No results for input(s): TSH, T4TOTAL, FREET4, T3FREE, THYROIDAB in the last 72 hours. Anemia Panel: No results for input(s): VITAMINB12, FOLATE, FERRITIN, TIBC, IRON, RETICCTPCT in the last 72 hours. Sepsis Labs: No results for input(s): PROCALCITON, LATICACIDVEN in the last 168 hours.  Recent Results (from the past 240 hour(s))  Surgical pcr screen     Status: None   Collection Time: 06/25/16 12:10 AM  Result Value Ref Range Status   MRSA, PCR NEGATIVE NEGATIVE Final   Staphylococcus aureus NEGATIVE NEGATIVE Final    Comment:        The Xpert SA Assay (FDA approved for NASAL specimens in patients over 4 years of age), is one component of a comprehensive surveillance program.  Test performance has been validated by [facility] for patients greater than or equal to 38 year old. It is not intended to diagnose infection nor to guide or monitor treatment.          Radiology Studies: Ct Head Wo Contrast  Result Date: 06/23/2016 CLINICAL DATA:  Initial evaluation for numbness with slurred speech EXAM: CT HEAD WITHOUT CONTRAST TECHNIQUE: Contiguous axial images were obtained from the base of the skull through the vertex without intravenous contrast. COMPARISON:  Recent MRI and CT from 06/13/2016. FINDINGS: Brain: Stable atrophy with chronic microvascular ischemic disease. Superimposed remote lacunar infarcts  present within the right basal ganglia. Recently identified right thalamic infarct not well visualized. No acute intracranial hemorrhage. No evidence for new acute large vessel territory infarct. No mass lesion, midline shift or mass effect. No hydrocephalus. No extra-axial fluid collection. Vascular: No asymmetric hyper for dense vessel. Scattered vascular calcifications noted within the carotid siphons. Skull: Scalp soft tissues within normal limits.  Calvarium intact. Sinuses/Orbits: Globes normal soft tissues normal. Paranasal sinuses and mastoid air cells are clear. Other: None. IMPRESSION: 1. No acute intracranial process. Recently identified right thalamic  lacunar infarct not well seen. 2. Stable atrophy with chronic microvascular ischemic disease. Multiple remote lacunar infarcts within the bilateral basal ganglia, stable. Electronically Signed   By: Jeannine Boga M.D.   On: 06/23/2016 17:37        Scheduled Meds: . sodium chloride   Intravenous Once  . carvedilol  3.125 mg Oral BID WC  . gabapentin  300 mg Oral Daily  . pantoprazole  40 mg Oral Q0600  . rosuvastatin  40 mg Oral q1800   Continuous Infusions:   LOS: 1 day    Dron Tanna Furry, MD [facility] Pager [phone]  If 7PM-7AM, please contact night-coverage [email or web address] Password [facility] 06/25/2016, 3:03 PM

## 2016-06-26 ENCOUNTER — Encounter (HOSPITAL_COMMUNITY): Payer: Self-pay | Admitting: Certified Registered Nurse Anesthetist

## 2016-06-26 ENCOUNTER — Inpatient Hospital Stay (HOSPITAL_COMMUNITY): Payer: Medicare Other | Admitting: Certified Registered Nurse Anesthetist

## 2016-06-26 ENCOUNTER — Encounter (HOSPITAL_COMMUNITY)
Admission: EM | Disposition: A | Discharge status: Home Health | Payer: Self-pay | Source: Home / Self Care | Attending: Nephrology

## 2016-06-26 DIAGNOSIS — K558 Other vascular disorders of intestine: Secondary | ICD-10-CM

## 2016-06-26 HISTORY — PX: ENTEROSCOPY: SHX5533

## 2016-06-26 SURGERY — ENTEROSCOPY
Anesthesia: Monitor Anesthesia Care

## 2016-06-26 MED ORDER — BUTAMBEN-TETRACAINE-BENZOCAINE 2-2-14 % EX AERO
INHALATION_SPRAY | CUTANEOUS | Status: DC | PRN
  Administered 2016-06-26: 2 via TOPICAL

## 2016-06-26 MED ORDER — PROPOFOL 10 MG/ML IV BOLUS
INTRAVENOUS | Status: DC | PRN
  Administered 2016-06-26 (×2): 10 mg via INTRAVENOUS

## 2016-06-26 MED ORDER — SODIUM CHLORIDE 0.9 % IV SOLN
INTRAVENOUS | Status: DC | PRN
  Administered 2016-06-26: 08:00:00 via INTRAVENOUS

## 2016-06-26 MED ORDER — PROPOFOL 500 MG/50ML IV EMUL
INTRAVENOUS | Status: DC | PRN
  Administered 2016-06-26: 100 ug/kg/min via INTRAVENOUS

## 2016-06-26 NOTE — Anesthesia Preprocedure Evaluation (Addendum)
Anesthesia Evaluation  Patient identified by MRN, date of birth, ID band Patient awake    Reviewed: Allergy & Precautions, NPO status , Patient's Chart, lab work & pertinent test results  Airway Mallampati: II  TM Distance: >3 FB Neck ROM: Full    Dental no notable dental hx. (+) Partial Lower, Partial Upper, Dental Advisory Given   Pulmonary sleep apnea , Current Smoker,    Pulmonary exam normal breath sounds clear to auscultation       Cardiovascular hypertension, + CAD  Normal cardiovascular exam Rhythm:Regular Rate:Normal  Echo 06/2016  - Left ventricle: The cavity size was normal. Wall thickness was increased in a pattern of mild LVH. Systolic function was mildly reduced. The estimated ejection fraction was in the range of 45% to 50%. Diffuse hypokinesis. There is hypokinesis of the apicalinferior myocardium. Doppler parameters are consistent with abnormal left ventricular relaxation (grade 1 diastolic dysfunction). - Ventricular septum: Septal motion showed abnormal function and dyssynergy (IVCD). - Mitral valve: There was mild regurgitation. - Pericardium, extracardiac: A trivial pericardial effusion was identified.  Impressions: - EF is mildly reduced when compared to prior. (50%) No cardiac source of emboli was indentified.   Neuro/Psych  Neuromuscular disease CVA, Residual Symptoms    GI/Hepatic GERD  Medicated,  Endo/Other  diabetes, Type 2  Renal/GU ESRF and DialysisRenal disease     Musculoskeletal   Abdominal   Peds  Hematology  (+) anemia ,   Anesthesia Other Findings   Reproductive/Obstetrics                            Anesthesia Physical Anesthesia Plan  ASA: III  Anesthesia Plan: MAC   Post-op Pain Management:    Induction: Intravenous  Airway Management Planned:   Additional Equipment:   Intra-op Plan:   Post-operative Plan:   Informed Consent: I have  reviewed the patients History and Physical, chart, labs and discussed the procedure including the risks, benefits and alternatives for the proposed anesthesia with the patient or authorized representative who has indicated his/her understanding and acceptance.   Dental advisory given  Plan Discussed with: CRNA  Anesthesia Plan Comments:         Anesthesia Quick Evaluation

## 2016-06-26 NOTE — Discharge Summary (Addendum)
Physician Discharge Summary  ROCKFORD LEINEN GBT:[id number] DOB: 1945/06/12 DOA: 06/23/2016  PCP: [name], NP  Admit date: 06/23/2016 Discharge date: 06/26/2016  Admitted From:home Disposition:home  Recommendations for Outpatient Follow-up:  1. Follow up with PCP in 1-2 weeks 2. 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 In the ER, patient was found to have hemoglobin of 4.9 which was down from 8.4 which was about 4 days prior to admission at PCPs office. Patient reported a black stool and fecal occult blood test positive. Evaluated by GI.  # Symptomatic anemia: Multifactorial etiology including acute blood loss from GI bleed and anemia of chronic kidney disease: -Pt received  about 4 units of red blood cell transfusion with improvement in hemoglobin. Patient underwent upper GI endoscopy today by GI. The endoscopy showed AV bowel perforation in the proximal jejunum which was treated with APC. It was likely that the larger friable AVM was the source of his melena. GI recommended to resume regular diet and can resume Plavix and aspirin from tomorrow. Patient was recommended to follow-up with GI in the few weeks. He denied any bowel movement or bleeding today. Clinically stable. Denied nausea vomiting chest pain or shortness of breath. He verbalized understanding of follow-up instructions. Currently on PPI. Discussed with the patient's wife at bedside regarding discharge planning.  #ESRD on hemodialysis: Recommended to follow-up with his nephrology for regular maintenance hematology treatment.  #Hypertension:  Continue home medications. Monitor blood pressure. Educated on low-salt diet and fluid restriction. Verbalized understanding.  Discharge Diagnoses:  Principal Problem:   Symptomatic anemia Active Problems:   Type 2 diabetes, uncontrolled, with neuropathy ([name])   Essential hypertension   CAD (coronary artery disease)   ESRD (end stage renal disease) ([name])   Anemia in chronic kidney disease, on chronic dialysis [facility])    Discharge Instructions  Discharge Instructions    Call MD for:  difficulty breathing, headache or visual disturbances    Complete by:  As directed    Call MD for:  extreme fatigue    Complete by:  As directed    Call MD for:  hives    Complete by:  As directed    Call MD for:  persistant dizziness or light-headedness    Complete by:  As directed    Call MD for:  persistant nausea and vomiting    Complete by:  As directed    Call MD for:  severe uncontrolled pain    Complete by:  As directed    Call MD for:  temperature >100.4    Complete by:  As directed    Diet - low sodium heart healthy    Complete by:  As directed    Discharge instructions    Complete by:  As directed    Please follow up with your GI physician in 3-4 weeks. Continue to follow up with your nephrologist for maintenance dialysis, Resume aspirin and plavix from tomorrow.   Face-to-face encounter (required for Medicare/Medicaid patients)    Complete by:  As directed    I Shaniece Bussa Tanna Furry certify that this patient is under my care and that I, or a nurse practitioner or physician's assistant working with me, had  a face-to-face encounter that meets the physician face-to-face encounter requirements with this patient on 06/26/2016. The encounter with the patient was in whole, or in part for the following medical condition(s) which is the primary reason for home health care (List medical condition): gib   The encounter with the patient was in whole, or in part, for the following medical condition,  which is the primary reason for home health care:  GI bleeding   I certify that, based on my findings, the following services are medically necessary home health services:   Nursing Physical therapy     Reason for Medically Necessary Home Health Services:  Skilled Nursing- Skilled Assessment/Observation   My clinical findings support the need for the above services:  Unable to leave home safely without assistance and/or assistive device   Further, I certify that my clinical findings support that this patient is homebound due to:  Shortness of Breath with activity   Home Health    Complete by:  As directed    To provide the following care/treatments:   PT OT RN     Increase activity slowly    Complete by:  As directed      Allergies as of 06/26/2016   No Known Allergies     Medication List    TAKE these medications   benzonatate 100 MG capsule Commonly known as:  TESSALON Take 1 capsule (100 mg total) by mouth 3 (three) times daily as needed for cough.   carvedilol 3.125 MG tablet Commonly known as:  COREG Take 1 tablet (3.125 mg total) by mouth 2 (two) times daily with a meal.   clopidogrel 75 MG tablet Commonly known as:  PLAVIX Take 1 tablet (75 mg total) by mouth daily.   gabapentin 300 MG capsule Commonly known as:  NEURONTIN Take 1 capsule (300 mg total) by mouth 3 (three) times daily. What changed:  when to take this   omeprazole 40 MG capsule Commonly known as:  PRILOSEC Take 1 capsule (40 mg total) by mouth daily.   rosuvastatin 40 MG tablet Commonly known as:  CRESTOR Take 1 tablet (40 mg total) by mouth daily.      Follow-up Information    [name], NP. Schedule an appointment as soon as possible for a visit in 1 week(s).   Specialty:  Internal Medicine Contact information: [address] [place] [address] [phone]          No Known  Allergies  Consultations: GI Procedures/Studies: endoscopy  Subjective: Patient was seen and examined at bedside. He came back from present endoscopy. Reportedly well. No bowel movement today. Denied nausea vomiting or abdominal pain. Wife at bedside. No shortness of breath, chest pain, dizziness or lightheadedness.  Discharge Exam: Vitals:   06/26/16 0900 06/26/16 0934  BP: (!) 193/79 (!) 159/88  Pulse:  86  Resp:  17  Temp:  98 F (36.7 C)   Vitals:   06/26/16 0841 06/26/16 0850 06/26/16 0900 06/26/16 0934  BP: (!) 158/66 (!) 179/77 (!) 193/79 (!) 159/88  Pulse: 91   86  Resp: 19   17  Temp: 98 F (36.7 C)   98 F (36.7 C)  TempSrc:    Oral  SpO2: 96%   100%  Weight:      Height:        General: Pt is alert, awake, not in acute distress Cardiovascular: RRR, S1/S2 +, no rubs, no gallops Respiratory: CTA bilaterally, no wheezing, no rhonchi  Abdominal: Soft, NT, ND, bowel sounds + Extremities: no edema, no cyanosis    The results of significant diagnostics from this hospitalization (including imaging, microbiology, ancillary and laboratory) are listed below for reference.     Microbiology: Recent Results (from the past 240 hour(s))  Surgical pcr screen     Status: None   Collection Time: 06/25/16 12:10 AM  Result Value Ref Range Status   MRSA, PCR NEGATIVE NEGATIVE Final   Staphylococcus aureus NEGATIVE NEGATIVE Final    Comment:        The Xpert SA Assay (FDA approved for NASAL specimens in patients over 2 years of age), is one component of a comprehensive surveillance program.  Test performance has been validated by [facility] for patients greater than or equal to 7 year old. It is not intended to diagnose infection nor to guide or monitor treatment.      Labs: BNP (last 3 results) No results for input(s): BNP in the last 8760 hours. Basic Metabolic Panel:  Recent Labs Lab 06/23/16 1638 06/23/16 1654 06/24/16 1007 06/25/16 0433  06/25/16 2042  NA 140 141 139 136 136  K 3.5 3.6 3.9 3.4* 3.9  CL 104 101 103 98* 97*  CO2 29  --  26 28 28   GLUCOSE 161* 158* 180* 97 142*  BUN 29* 31* 40* 15 28*  CREATININE 4.38* 4.60* 5.73* 3.39* 5.13*  CALCIUM 7.5*  --  7.3* 7.5* 7.4*  PHOS  --   --  4.1 3.4 4.0   Liver Function Tests:  Recent Labs Lab 06/23/16 1638 06/24/16 1007 06/25/16 0433 06/25/16 2042  AST 17  --   --   --   ALT 13*  --   --   --   ALKPHOS 94  --   --   --   BILITOT 0.3  --   --   --   PROT 5.7*  --   --   --   ALBUMIN 2.9* 2.7* 2.6* 2.7*   No results for input(s): LIPASE, AMYLASE in the last 168 hours. No results for input(s): AMMONIA in the last 168 hours. CBC:  Recent Labs Lab 06/23/16 1638 06/23/16 1654 06/24/16 0555 06/25/16 0433 06/25/16 2042  WBC 5.4  --  6.2 6.8 6.7  NEUTROABS 3.7  --   --   --   --   HGB 4.9* 5.1* 6.9* 9.5* 9.1*  HCT 15.0* 15.0* 20.8* 27.3* 27.7*  MCV 93.2  --  86.3 81.5 83.2  PLT 246  --  209 178 185   Cardiac Enzymes:  Recent Labs Lab 06/24/16 0555  TROPONINI 0.03*   BNP: Invalid input(s): POCBNP CBG:  Recent Labs Lab 06/23/16 1727  GLUCAP 167*   D-Dimer No results for input(s): DDIMER in the last 72 hours. Hgb A1c No results for input(s): HGBA1C in the last 72 hours. Lipid Profile No results for input(s): CHOL, HDL, LDLCALC, TRIG, CHOLHDL, LDLDIRECT in the last 72 hours. Thyroid function studies No results for input(s): TSH, T4TOTAL, T3FREE, THYROIDAB in the last 72 hours.  Invalid input(s): FREET3 Anemia work up No results for input(s): VITAMINB12, FOLATE, FERRITIN, TIBC, IRON, RETICCTPCT in the last 72 hours. Urinalysis    Component Value Date/Time   COLORURINE YELLOW 04/01/2016 [place] 04/01/2016 0939   LABSPEC >=1.030 (A) 04/01/2016 0939   PHURINE 5.5 04/01/2016 0939   GLUCOSEU NEGATIVE 04/01/2016 0939   HGBUR MODERATE (A) 04/01/2016 0939   BILIRUBINUR SMALL (A) 04/01/2016 0939   BILIRUBINUR  small  06/21/2012 1041   KETONESUR TRACE (A) 04/01/2016 0939   PROTEINUR >300 (A) 12/25/2015 1028   UROBILINOGEN 0.2 04/01/2016 0939   NITRITE NEGATIVE 04/01/2016 0939   LEUKOCYTESUR NEGATIVE 04/01/2016 0939   Sepsis Labs Invalid input(s): PROCALCITONIN,  WBC,  LACTICIDVEN Microbiology Recent Results (from the past 240 hour(s))  Surgical pcr screen     Status: None   Collection Time: 06/25/16 12:10 AM  Result Value Ref Range Status   MRSA, PCR NEGATIVE NEGATIVE Final   Staphylococcus aureus NEGATIVE NEGATIVE Final    Comment:        The Xpert SA Assay (FDA approved for NASAL specimens in patients over 93 years of age), is one component of a comprehensive surveillance program.  Test performance has been validated by [facility] for patients greater than or equal to 68 year old. It is not intended to diagnose infection nor to guide or monitor treatment.      Time coordinating discharge: 28 minutes  SIGNED:   Rosita Fire, MD  [facility] 06/26/2016, 12:22 PM  If 7PM-7AM, please contact night-coverage [email or web address] Password [email or web address]

## 2016-06-26 NOTE — Progress Notes (Signed)
Assessment:  1 ESRD(MWF) 2 GI bleed w/ hx of AVMs 3 Severe anemia due to # 2 4 Recent CVA  Plan: 1 HD MWF at out-pt unit  Subjective: Interval History: Enteroscopy findings noted  Objective: Vital signs in last 24 hours: Temp:  [98 F (36.7 C)-98.9 F (37.2 C)] 98 F (36.7 C) (04/12 0934) Pulse Rate:  [78-97] 86 (04/12 0934) Resp:  [16-20] 17 (04/12 0934) BP: (138-193)/(66-99) 159/88 (04/12 0934) SpO2:  [96 %-100 %] 100 % (04/12 0934) Weight change:   Intake/Output from previous day: No intake/output data recorded. Intake/Output this shift: Total I/O In: 350 [I.V.:350] Out: -   Exam unchaged  Lab Results:  Recent Labs  06/25/16 0433 06/25/16 2042  WBC 6.8 6.7  HGB 9.5* 9.1*  HCT 27.3* 27.7*  PLT 178 185   BMET:  Recent Labs  06/25/16 0433 06/25/16 2042  NA 136 136  K 3.4* 3.9  CL 98* 97*  CO2 28 28  GLUCOSE 97 142*  BUN 15 28*  CREATININE 3.39* 5.13*  CALCIUM 7.5* 7.4*   No results for input(s): PTH in the last 72 hours. Iron Studies: No results for input(s): IRON, TIBC, TRANSFERRIN, FERRITIN in the last 72 hours. Studies/Results: No results found.  Scheduled: . sodium chloride   Intravenous Once  . carvedilol  3.125 mg Oral BID WC  . gabapentin  300 mg Oral Daily  . pantoprazole  40 mg Oral Q0600  . rosuvastatin  40 mg Oral q1800     LOS: 2 days   Son Barkan C 06/26/2016,12:14 PM

## 2016-06-26 NOTE — H&P (View-Only) (Signed)
Daily Rounding Note  06/25/2016, 9:29 AM  LOS: 1 day   SUBJECTIVE:   Chief complaint: black stools    Stool this AM: still formed and black, same as he's had for the last few weeks.    OBJECTIVE:         Vital signs in last 24 hours:    Temp:  [97.4 F (36.3 C)-98.7 F (37.1 C)] 98.6 F (37 C) (04/11 0846) Pulse Rate:  [77-95] 93 (04/11 0846) Resp:  [15-18] 15 (04/11 0846) BP: (139-181)/(72-99) 153/85 (04/11 0846) SpO2:  [96 %-100 %] 98 % (04/11 0846) Weight:  [63.5 kg (139 lb 15.9 oz)-63.9 kg (140 lb 14 oz)] 63.5 kg (139 lb 15.9 oz) (04/10 1745) Last BM Date: 06/23/16 Filed Weights   06/24/16 0204 06/24/16 1332 06/24/16 1745  Weight: 64 kg (141 lb 1.6 oz) 63.9 kg (140 lb 14 oz) 63.5 kg (139 lb 15.9 oz)   General: pleasant, looks well.  comfortable   Heart: RRR Chest: clear bil.   Abdomen: soft, NT, ND.  Active BS  Extremities: no CCE.   Neuro/Psych:  Oriented x 3.  No gross deficits.    Lab Results:  Recent Labs  06/23/16 1638 06/23/16 1654 06/24/16 0555 06/25/16 0433  WBC 5.4  --  6.2 6.8  HGB 4.9* 5.1* 6.9* 9.5*  HCT 15.0* 15.0* 20.8* 27.3*  PLT 246  --  209 178   BMET  Recent Labs  06/23/16 1638 06/23/16 1654 06/24/16 1007 06/25/16 0433  NA 140 141 139 136  K 3.5 3.6 3.9 3.4*  CL 104 101 103 98*  CO2 29  --  26 28  GLUCOSE 161* 158* 180* 97  BUN 29* 31* 40* 15  CREATININE 4.38* 4.60* 5.73* 3.39*  CALCIUM 7.5*  --  7.3* 7.5*   LFT  Recent Labs  06/23/16 1638 06/24/16 1007 06/25/16 0433  PROT 5.7*  --   --   ALBUMIN 2.9* 2.7* 2.6*  AST 17  --   --   ALT 13*  --   --   ALKPHOS 94  --   --   BILITOT 0.3  --   --    PT/INR  Recent Labs  06/23/16 1638  LABPROT 14.1  INR 1.08   Hepatitis Panel  Recent Labs  06/24/16 1349  HEPBSAG Negative    Studies/Results: Ct Head Wo Contrast  Result Date: 06/23/2016 CLINICAL DATA:  Initial evaluation for numbness with slurred  speech EXAM: CT HEAD WITHOUT CONTRAST TECHNIQUE: Contiguous axial images were obtained from the base of the skull through the vertex without intravenous contrast. COMPARISON:  Recent MRI and CT from 06/13/2016. FINDINGS: Brain: Stable atrophy with chronic microvascular ischemic disease. Superimposed remote lacunar infarcts present within the right basal ganglia. Recently identified right thalamic infarct not well visualized. No acute intracranial hemorrhage. No evidence for new acute large vessel territory infarct. No mass lesion, midline shift or mass effect. No hydrocephalus. No extra-axial fluid collection. Vascular: No asymmetric hyper for dense vessel. Scattered vascular calcifications noted within the carotid siphons. Skull: Scalp soft tissues within normal limits.  Calvarium intact. Sinuses/Orbits: Globes normal soft tissues normal. Paranasal sinuses and mastoid air cells are clear. Other: None. IMPRESSION: 1. No acute intracranial process. Recently identified right thalamic lacunar infarct not well seen. 2. Stable atrophy with chronic microvascular ischemic disease. Multiple remote lacunar infarcts within the bilateral basal ganglia, stable. Electronically Signed   By: Pincus Badder.D.  On: 06/23/2016 17:37   Scheduled Meds: . sodium chloride   Intravenous Once  . carvedilol  3.125 mg Oral BID WC  . gabapentin  300 mg Oral Daily  . pantoprazole  40 mg Oral Q0600  . rosuvastatin  40 mg Oral q1800   Continuous Infusions: PRN Meds:.   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  Frequent blood transfusions, parenteral iron overseen by dialysis MDs.   *  ESRD.   HD MWF but dialyzed as inpt yesterday (Tuesday).   PLAN   *  Enteroscopy is on for 4/12 at 8 AM.  NPO after midnight. Pt agreeable.    Azucena Freed  06/25/2016, 9:29 AM Pager:  [phone]   ________________________________________________________________________  Velora Heckler GI MD note:  I personally examined the patient, reviewed the data and agree with the assessment and plan described above.  Enteroscopy tomorrow AM.   Owens Loffler, MD [facility] Pager [phone]

## 2016-06-26 NOTE — Op Note (Signed)
[facility] Patient Name: [name] Procedure Date : 06/26/2016 MRN: [id number] Attending MD: Milus Banister , MD Date of Birth: [birth year] CSN: [id number] Age: 72 Admit Type: Inpatient Procedure:                Small bowel enteroscopy Indications:              Melena; h/o SB AVMs treated by primary GI in [place] Providers:                Milus Banister, MD, Carmie End, RN, Cherylynn Ridges, Technician, Garrison Columbus, CRNA Referring MD:              Medicines:                Monitored Anesthesia Care Complications:            No immediate complications. Estimated blood loss:                            None. Estimated Blood Loss:     Estimated blood loss: none. Procedure:                Pre-Anesthesia Assessment:                           - Prior to the procedure, a History and Physical                            was performed, and patient medications and                            allergies were reviewed. The patient's tolerance of                            previous anesthesia was also reviewed. The risks                            and benefits of the procedure and the sedation                            options and risks were discussed with the patient.                            All questions were answered, and informed consent                            was obtained. Prior Anticoagulants: The patient has                            taken Plavix (clopidogrel), last dose was 3 days  prior to procedure. ASA Grade Assessment: IV - A                            patient with severe systemic disease that is a                            constant threat to life. After reviewing the risks                            and benefits, the patient was deemed in                            satisfactory condition to undergo the procedure.                           After obtaining informed consent, the  endoscope was                            passed under direct vision. Throughout the                            procedure, the patient's blood pressure, pulse, and                            oxygen saturations were monitored continuously. The                            EC-[id number] ([id number]) scope was introduced through                            the mouth and advanced to the mid-jejunum. The                            small bowel enteroscopy was accomplished without                            difficulty. The patient tolerated the procedure                            well. Scope In: Scope Out: Findings:      Two angioectasias were found in the proximal jejunum. The largest was       about 30mm across and bled with very minor scope trauma. Both were       treated with APC to destruction with control of bleeding.      The examination was otherwise normal. Impression:               - AVMs in proximal jejunum, treated with APC to                            destruction. It is very likely that the larger,                            friable AVM was the source of his recent  melena. Recommendation:           - Return patient to hospital ward for possible                            discharge same day.                           - Resume regular diet.                           - Continue present medications. Can resume plavix                            and ASA tomorrow.                           - Follow up with your primary GI team in [facility]. 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 All rights reserved. The codes documented in this report are preliminary and upon coder review may  be revised to meet current compliance requirements. Milus Banister, MD 06/26/2016 8:37:11 AM This report has been signed electronically. Number of Addenda: 0

## 2016-06-26 NOTE — Anesthesia Procedure Notes (Signed)
Procedure Name: MAC Date/Time: 06/26/2016 8:00 AM Performed by: Garrison Columbus T Pre-anesthesia Checklist: Patient identified, Emergency Drugs available, Suction available and Patient being monitored Patient Re-evaluated:Patient Re-evaluated prior to inductionOxygen Delivery Method: Simple face mask Preoxygenation: Pre-oxygenation with 100% oxygen Intubation Type: IV induction Placement Confirmation: positive ETCO2 and breath sounds checked- equal and bilateral Dental Injury: Teeth and Oropharynx as per pre-operative assessment

## 2016-06-26 NOTE — Transfer of Care (Signed)
Immediate Anesthesia Transfer of Care Note  Patient: [name]  Procedure(s) Performed: Procedure(s) with comments: ENTEROSCOPY (N/A) - this is a push enteroscopy  Patient Location: Endoscopy Unit  Anesthesia Type:MAC  Level of Consciousness: awake, alert  and oriented  Airway & Oxygen Therapy: Patient Spontanous Breathing and Patient connected to nasal cannula oxygen  Post-op Assessment: Report given to RN, Post -op Vital signs reviewed and stable and Patient moving all extremities X 4  Post vital signs: Reviewed and stable  Last Vitals:  Vitals:   06/26/16 0509 06/26/16 0722  BP: (!) 164/83 (!) 185/90  Pulse: 95 92  Resp: 18 20  Temp: 36.9 C 36.7 C    Last Pain:  Vitals:   06/26/16 0722  TempSrc: Oral  PainSc:       Patients Stated Pain Goal: 2 (09/38/18 2993)  Complications: No apparent anesthesia complications

## 2016-06-26 NOTE — Interval H&P Note (Signed)
History and Physical Interval Note:  06/26/2016 7:32 AM  [name]  has presented today for surgery, with the diagnosis of gi bleed, anemia. hx SB AVMs  The various methods of treatment have been discussed with the patient and family. After consideration of risks, benefits and other options for treatment, the patient has consented to  Procedure(s) with comments: ENTEROSCOPY (N/A) - this is a push enteroscopy as a surgical intervention .  The patient's history has been reviewed, patient examined, no change in status, stable for surgery.  I have reviewed the patient's chart and labs.  Questions were answered to the patient's satisfaction.     Milus Banister

## 2016-06-26 NOTE — Care Management Note (Signed)
Case Management Note  Patient Details  Name: [name] MRN: [id number] Date of Birth: 07/19/1945  Subjective/Objective:                    Action/Plan: Pt discharging home with orders for [facility] services. Pt was active with [facility] prior to admission and would like to continue with their services. CM spoke to MD and he is in agreement. Orders in EPIC and MD to do face to face. Santiago Glad with [facility] notified of resumption of care.  Pt states he has transportation home today.   Expected Discharge Date:  06/26/16               Expected Discharge Plan:  Home/Self Care  In-House Referral:     Discharge planning Services  CM Consult  Post Acute Care Choice:  Home Health Choice offered to:  Patient  DME Arranged:    DME Agency:     HH Arranged:  RN, PT, OT HH Agency:  [facility]  Status of Service:  Completed, signed off  If discussed at [place] of Stay Meetings, dates discussed:    Additional Comments:  [name], RN 06/26/2016, 12:31 PM

## 2016-06-27 ENCOUNTER — Telehealth: Payer: Self-pay | Admitting: Family

## 2016-06-27 ENCOUNTER — Encounter (HOSPITAL_COMMUNITY): Payer: Self-pay | Admitting: Gastroenterology

## 2016-06-27 NOTE — Anesthesia Postprocedure Evaluation (Signed)
Anesthesia Post Note  Patient: [name]  Procedure(s) Performed: Procedure(s) (LRB): ENTEROSCOPY (N/A)  Patient location during evaluation: PACU Anesthesia Type: MAC Level of consciousness: awake and alert Pain management: pain level controlled Vital Signs Assessment: post-procedure vital signs reviewed and stable Respiratory status: spontaneous breathing Cardiovascular status: stable Anesthetic complications: no       Last Vitals:  Vitals:   06/26/16 0900 06/26/16 0934  BP: (!) 193/79 (!) 159/88  Pulse:  86  Resp:  17  Temp:  36.7 C    Last Pain:  Vitals:   06/26/16 0934  TempSrc: Oral  PainSc:                  Nolon Nations

## 2016-06-27 NOTE — Telephone Encounter (Signed)
Please contact pt for TCM follow up.  

## 2016-06-27 NOTE — Telephone Encounter (Signed)
Unable to reach patient at time of TCM/Hospital Follow-up call. Left message for patient to return call when available.  

## 2016-06-30 NOTE — Telephone Encounter (Signed)
Left message x 2 for TCM/Hospital Follow-up call. 

## 2016-07-01 ENCOUNTER — Telehealth: Payer: Self-pay | Admitting: Family

## 2016-07-01 NOTE — Telephone Encounter (Signed)
Left detailed message on voicemail and ok to proceed with [place] orders as below. Prilosec rx was sent to pharmacy on 06/19/16 and refills should be on file if not already picked up. Henrene Dodge to call back if any further concerns.

## 2016-07-01 NOTE — Telephone Encounter (Signed)
Caller name: Juliette Mangle with [facility] Can be reached: [phone]  Reason for call: Start of Care today for [facility] nursing stroke and GI bleed mgmt, pt is doing well. Wants VO to see pt 2x week for 2 weeks and 1x week for 1 week. Pt has meds in home except priolosec. Pt thinks it may be at pharmacy and he will call today to check. Please call with VO.

## 2016-07-03 ENCOUNTER — Telehealth: Payer: Self-pay | Admitting: Family

## 2016-07-03 NOTE — Telephone Encounter (Signed)
Caller name: Altha Harm Relationship to patient: [place] Can be reached: [phone] Pharmacy:  Reason for call: Request verbal orders for PT 1 time a week for 1 week, 2 times a week for 1 week and then 1 time a week for 2 weeks to work on strengthening and balance

## 2016-07-04 NOTE — Telephone Encounter (Signed)
Left detailed message on Christine's voicemail and to call if any further concerns.

## 2016-07-05 ENCOUNTER — Encounter (HOSPITAL_BASED_OUTPATIENT_CLINIC_OR_DEPARTMENT_OTHER): Payer: Self-pay | Admitting: *Deleted

## 2016-07-05 ENCOUNTER — Inpatient Hospital Stay (HOSPITAL_BASED_OUTPATIENT_CLINIC_OR_DEPARTMENT_OTHER)
Admission: EM | Admit: 2016-07-05 | Discharge: 2016-07-07 | DRG: 291 | Disposition: A | Discharge status: Home / Self Care | Payer: Medicare Other | Attending: Internal Medicine | Admitting: Internal Medicine

## 2016-07-05 ENCOUNTER — Emergency Department (HOSPITAL_BASED_OUTPATIENT_CLINIC_OR_DEPARTMENT_OTHER): Payer: Medicare Other

## 2016-07-05 DIAGNOSIS — Z833 Family history of diabetes mellitus: Secondary | ICD-10-CM

## 2016-07-05 DIAGNOSIS — R000 Tachycardia, unspecified: Secondary | ICD-10-CM | POA: Diagnosis present

## 2016-07-05 DIAGNOSIS — R0603 Acute respiratory distress: Secondary | ICD-10-CM | POA: Diagnosis present

## 2016-07-05 DIAGNOSIS — R739 Hyperglycemia, unspecified: Secondary | ICD-10-CM

## 2016-07-05 DIAGNOSIS — G8191 Hemiplegia, unspecified affecting right dominant side: Secondary | ICD-10-CM | POA: Diagnosis present

## 2016-07-05 DIAGNOSIS — Z8546 Personal history of malignant neoplasm of prostate: Secondary | ICD-10-CM

## 2016-07-05 DIAGNOSIS — R0602 Shortness of breath: Secondary | ICD-10-CM

## 2016-07-05 DIAGNOSIS — Z7902 Long term (current) use of antithrombotics/antiplatelets: Secondary | ICD-10-CM

## 2016-07-05 DIAGNOSIS — Z8673 Personal history of transient ischemic attack (TIA), and cerebral infarction without residual deficits: Secondary | ICD-10-CM

## 2016-07-05 DIAGNOSIS — J810 Acute pulmonary edema: Secondary | ICD-10-CM

## 2016-07-05 DIAGNOSIS — I169 Hypertensive crisis, unspecified: Secondary | ICD-10-CM | POA: Diagnosis not present

## 2016-07-05 DIAGNOSIS — E7800 Pure hypercholesterolemia, unspecified: Secondary | ICD-10-CM | POA: Diagnosis present

## 2016-07-05 DIAGNOSIS — Z8249 Family history of ischemic heart disease and other diseases of the circulatory system: Secondary | ICD-10-CM

## 2016-07-05 DIAGNOSIS — N400 Enlarged prostate without lower urinary tract symptoms: Secondary | ICD-10-CM | POA: Diagnosis present

## 2016-07-05 DIAGNOSIS — I2510 Atherosclerotic heart disease of native coronary artery without angina pectoris: Secondary | ICD-10-CM | POA: Diagnosis present

## 2016-07-05 DIAGNOSIS — I161 Hypertensive emergency: Secondary | ICD-10-CM | POA: Diagnosis present

## 2016-07-05 DIAGNOSIS — N186 End stage renal disease: Secondary | ICD-10-CM | POA: Diagnosis present

## 2016-07-05 DIAGNOSIS — I10 Essential (primary) hypertension: Secondary | ICD-10-CM | POA: Diagnosis present

## 2016-07-05 DIAGNOSIS — Z800 Family history of malignant neoplasm of digestive organs: Secondary | ICD-10-CM

## 2016-07-05 DIAGNOSIS — I132 Hypertensive heart and chronic kidney disease with heart failure and with stage 5 chronic kidney disease, or end stage renal disease: Secondary | ICD-10-CM | POA: Diagnosis not present

## 2016-07-05 DIAGNOSIS — E1165 Type 2 diabetes mellitus with hyperglycemia: Secondary | ICD-10-CM | POA: Diagnosis present

## 2016-07-05 DIAGNOSIS — I5042 Chronic combined systolic (congestive) and diastolic (congestive) heart failure: Secondary | ICD-10-CM | POA: Diagnosis present

## 2016-07-05 DIAGNOSIS — E1140 Type 2 diabetes mellitus with diabetic neuropathy, unspecified: Secondary | ICD-10-CM | POA: Diagnosis present

## 2016-07-05 DIAGNOSIS — E1142 Type 2 diabetes mellitus with diabetic polyneuropathy: Secondary | ICD-10-CM | POA: Diagnosis present

## 2016-07-05 DIAGNOSIS — Z992 Dependence on renal dialysis: Secondary | ICD-10-CM

## 2016-07-05 DIAGNOSIS — M545 Low back pain: Secondary | ICD-10-CM | POA: Diagnosis present

## 2016-07-05 DIAGNOSIS — G4730 Sleep apnea, unspecified: Secondary | ICD-10-CM | POA: Diagnosis present

## 2016-07-05 DIAGNOSIS — I160 Hypertensive urgency: Secondary | ICD-10-CM | POA: Diagnosis present

## 2016-07-05 DIAGNOSIS — E1122 Type 2 diabetes mellitus with diabetic chronic kidney disease: Secondary | ICD-10-CM | POA: Diagnosis present

## 2016-07-05 DIAGNOSIS — D649 Anemia, unspecified: Secondary | ICD-10-CM | POA: Diagnosis present

## 2016-07-05 DIAGNOSIS — G8929 Other chronic pain: Secondary | ICD-10-CM | POA: Diagnosis present

## 2016-07-05 DIAGNOSIS — F17210 Nicotine dependence, cigarettes, uncomplicated: Secondary | ICD-10-CM | POA: Diagnosis present

## 2016-07-05 DIAGNOSIS — Z823 Family history of stroke: Secondary | ICD-10-CM

## 2016-07-05 DIAGNOSIS — K219 Gastro-esophageal reflux disease without esophagitis: Secondary | ICD-10-CM | POA: Diagnosis present

## 2016-07-05 DIAGNOSIS — IMO0002 Reserved for concepts with insufficient information to code with codable children: Secondary | ICD-10-CM | POA: Diagnosis present

## 2016-07-05 LAB — COMPREHENSIVE METABOLIC PANEL
ALBUMIN: 3.4 g/dL — AB (ref 3.5–5.0)
ALT: 16 U/L — AB (ref 17–63)
AST: 30 U/L (ref 15–41)
Alkaline Phosphatase: 128 U/L — ABNORMAL HIGH (ref 38–126)
Anion gap: 14 (ref 5–15)
BUN: 28 mg/dL — AB (ref 6–20)
CHLORIDE: 102 mmol/L (ref 101–111)
CO2: 25 mmol/L (ref 22–32)
CREATININE: 6.41 mg/dL — AB (ref 0.61–1.24)
Calcium: 7.7 mg/dL — ABNORMAL LOW (ref 8.9–10.3)
GFR calc Af Amer: 9 mL/min — ABNORMAL LOW (ref >60)
GFR calc non Af Amer: 8 mL/min — ABNORMAL LOW (ref >60)
GLUCOSE: 262 mg/dL — AB (ref 65–99)
POTASSIUM: 3.4 mmol/L — AB (ref 3.5–5.1)
Sodium: 141 mmol/L (ref 135–145)
Total Bilirubin: 0.3 mg/dL (ref 0.3–1.2)
Total Protein: 7.1 g/dL (ref 6.5–8.1)

## 2016-07-05 LAB — CBC WITH DIFFERENTIAL/PLATELET
Basophils Absolute: 0.1 10*3/uL (ref 0.0–0.1)
Basophils Relative: 1 %
EOS ABS: 0.4 10*3/uL (ref 0.0–0.7)
EOS PCT: 4 %
HCT: 33.7 % — ABNORMAL LOW (ref 39.0–52.0)
Hemoglobin: 10.6 g/dL — ABNORMAL LOW (ref 13.0–17.0)
LYMPHS ABS: 3.8 10*3/uL (ref 0.7–4.0)
LYMPHS PCT: 37 %
MCH: 29 pg (ref 26.0–34.0)
MCHC: 31.5 g/dL (ref 30.0–36.0)
MCV: 92.1 fL (ref 78.0–100.0)
MONO ABS: 1.5 10*3/uL — AB (ref 0.1–1.0)
Monocytes Relative: 14 %
Neutro Abs: 4.5 10*3/uL (ref 1.7–7.7)
Neutrophils Relative %: 44 %
PLATELETS: 352 10*3/uL (ref 150–400)
RBC: 3.66 MIL/uL — ABNORMAL LOW (ref 4.22–5.81)
RDW: 20 % — AB (ref 11.5–15.5)
WBC: 10.2 10*3/uL (ref 4.0–10.5)

## 2016-07-05 LAB — I-STAT ARTERIAL BLOOD GAS, ED
Acid-base deficit: 4 mmol/L — ABNORMAL HIGH (ref 0.0–2.0)
Bicarbonate: 23.7 mmol/L (ref 20.0–28.0)
O2 Saturation: 73 %
PH ART: 7.257 — AB (ref 7.350–7.450)
TCO2: 25 mmol/L (ref 0–100)
pCO2 arterial: 53.3 mmHg — ABNORMAL HIGH (ref 32.0–48.0)
pO2, Arterial: 46 mmHg — ABNORMAL LOW (ref 83.0–108.0)

## 2016-07-05 LAB — TROPONIN I: TROPONIN I: 0.03 ng/mL — AB (ref <0.03)

## 2016-07-05 MED ORDER — IPRATROPIUM-ALBUTEROL 0.5-2.5 (3) MG/3ML IN SOLN
RESPIRATORY_TRACT | Status: AC
  Filled 2016-07-05: qty 6

## 2016-07-05 MED ORDER — LABETALOL HCL 5 MG/ML IV SOLN
20.0000 mg | Freq: Once | INTRAVENOUS | Status: AC
  Administered 2016-07-05: 10 mg via INTRAVENOUS
  Filled 2016-07-05: qty 4

## 2016-07-05 MED ORDER — IPRATROPIUM-ALBUTEROL 0.5-2.5 (3) MG/3ML IN SOLN
3.0000 mL | Freq: Once | RESPIRATORY_TRACT | Status: AC
  Administered 2016-07-05: 23:00:00 via RESPIRATORY_TRACT

## 2016-07-05 MED ORDER — ALBUTEROL SULFATE (2.5 MG/3ML) 0.083% IN NEBU
2.5000 mg | INHALATION_SOLUTION | Freq: Once | RESPIRATORY_TRACT | Status: AC
  Administered 2016-07-05: 23:00:00 via RESPIRATORY_TRACT

## 2016-07-05 MED ORDER — ALBUTEROL SULFATE (2.5 MG/3ML) 0.083% IN NEBU
INHALATION_SOLUTION | RESPIRATORY_TRACT | Status: AC
  Filled 2016-07-05: qty 12

## 2016-07-05 MED ORDER — IPRATROPIUM-ALBUTEROL 0.5-2.5 (3) MG/3ML IN SOLN
RESPIRATORY_TRACT | Status: AC
  Filled 2016-07-05: qty 3

## 2016-07-05 MED ORDER — ALBUTEROL SULFATE (2.5 MG/3ML) 0.083% IN NEBU
INHALATION_SOLUTION | RESPIRATORY_TRACT | Status: AC
  Filled 2016-07-05: qty 6

## 2016-07-05 NOTE — ED Notes (Signed)
MD aware of pt's current VS. Orders given to give 5mg  Labetolol now and 5mg  after reassessing.

## 2016-07-05 NOTE — ED Notes (Signed)
Pt states he feels much better. Breathing is less labored. Appears comfortable. VSS.

## 2016-07-05 NOTE — ED Provider Notes (Addendum)
[facility] Provider Note: Georgena Spurling, MD, FACEP  CSN: [id number] MRN: [id number] ARRIVAL: 07/05/16 at 2247 (EDP at bedside just after arrival to room) ROOM: MHT14/MHT14  By signing my name below, I, Eunice Blase, attest that this documentation has been prepared under the direction and in the presence of Shanon Rosser, MD. Electronically signed, Eunice Blase, ED Scribe. 07/06/16. 12:07 AM.   CHIEF COMPLAINT  Respiratory Distress   HISTORY OF PRESENT ILLNESS  [name] is a 71 y.o. male with end stage renal disease on dialysis. Pt's wife brought him for acute respiratory distress with onset about 10 minutes prior to arrival he was lying in bed. She is not aware of any inciting factor. She noted him to be diaphoretic and feeling warm. He denies pain. Symptoms are severe. SPO2 noted to be 68% on RA. Blood pressure significantly elevated at 245/121. Pt significantly tachycardic at 145. Patient tachypneic at 47. Respiratory therapist prepared pt for BiPAP as soon as he was brought to the resuscitation room.  LEVEL 5 CAVEAT: HPI and ROS limited due to respiratory distress  Past Medical History:  Diagnosis Date  . 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 Anemia, iron deficiency 03/24/2011   a. Recurrent GI bleed, tx with periodic iron infusions.  . 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 AVM (arteriovenous malformation) of duodenum, acquired    egds in 01/2012, 04/2011  . BPH (benign prostatic hyperplasia)   . CAD (coronary artery disease)    a. Cath 09/2012: moderate borderline CAD in mid LAD/small diagonal branch, mild RCA stenosis, to be managed medically   . Chronic lower back pain   . Diabetic peripheral neuropathy ([place]) 2014   foot pain.  . Fatty liver    on CT of 11/2010  . GERD (gastroesophageal reflux disease)   . GI bleed    a. Recurrent GI bleed, tx with IV iron. b. Per heme notes - likely AVMs 04/2012 (tx with cauterization several months ago).  .  Hematuria    a. Urology note scan from 07/2012: cystoscopy without evidence for bladder lesion, only lateral hypertrophy of posterior urethra, bladder impression from BPH. b. Pt states he had "some tests" scheduled for later in July 2014.  . High cholesterol   . Hypertension   . 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 Orthostatic hypotension    a. Tx with florinef.  . Peripheral neuropathy   . Polyp, colonic    Colonoscopy 01/2012 "benign" polyp  . Prostate cancer ([place])   . Sleep apnea    "had mask; couldn't sleep in it" (09/20/2012)  . Smoker   . Stage III chronic kidney disease    a. Stage 3 (DM with complications ->CKD, peripheral neuropathy).  . Stroke ([place])    x 2  . Type II diabetes mellitus ([place])    a. Dx 1994, uncontrolled.     Past Surgical History:  Procedure Laterality Date  . ENTEROSCOPY N/A 03/01/2013   Procedure: ENTEROSCOPY;  Surgeon: Inda Castle, MD;  Location: [facility];  Service: Endoscopy;  Laterality: N/A;  . ENTEROSCOPY N/A 06/26/2016   Procedure: ENTEROSCOPY;  Surgeon: Milus Banister, MD;  Location: [facility];  Service: Endoscopy;  Laterality: N/A;  this is a push enteroscopy  . GIVENS CAPSULE STUDY N/A 04/05/2015   Procedure: GIVENS CAPSULE STUDY;  Surgeon: Gatha Mayer, MD;  Location: [facility];  Service: Endoscopy;  Laterality: N/A;  . INGUINAL HERNIA REPAIR Right 2011  . LEFT HEART  CATHETERIZATION WITH CORONARY ANGIOGRAM N/A 09/21/2012   Procedure: LEFT HEART CATHETERIZATION WITH CORONARY ANGIOGRAM;  Surgeon: Peter M Martinique, MD;  Location: [facility] CATH LAB;  Service: Cardiovascular;  Laterality: N/A;  . LUMBAR DISC SURGERY  1980's  . LYMPHADENECTOMY Bilateral 01/19/2013   Procedure: LYMPHADENECTOMY;  Surgeon: Bernestine Amass, MD;  Location: [facility];  Service: Urology;  Laterality: Bilateral;  . ROBOT ASSISTED LAPAROSCOPIC RADICAL PROSTATECTOMY N/A 01/19/2013   Procedure: ROBOTIC ASSISTED LAPAROSCOPIC RADICAL PROSTATECTOMY;  Surgeon: Bernestine Amass, MD;  Location: [facility];  Service: Urology;  Laterality: N/A;  . SHOULDER OPEN ROTATOR CUFF REPAIR Left 1980's    Family History  Problem Relation Age of Onset  . Stroke Father     Died at 52  . Diabetes Mother   . Heart attack Brother     Died at 1  . Diabetes Sister   . Stomach cancer Brother   . Heart attack Sister     Social History  Substance Use Topics  . Smoking status: Current Some Day Smoker    Packs/day: 1.00    Years: 43.00    Types: Cigarettes    Start date: 04/15/1968  . Smokeless tobacco: Never Used     Comment: 07-22--2016  still smoking  . Alcohol use No     Comment: 09/20/2012 "Used to; stopped ~ 2009; never had problem w/it"    Prior to Admission medications   Medication Sig Start Date End Date Taking? Authorizing Provider  benzonatate (TESSALON) 100 MG capsule Take 1 capsule (100 mg total) by mouth 3 (three) times daily as needed for cough. 04/30/16   Percell Miller Saguier, PA-C  carvedilol (COREG) 3.125 MG tablet Take 1 tablet (3.125 mg total) by mouth 2 (two) times daily with a meal. 06/19/16   Mackie Pai, PA-C  clopidogrel (PLAVIX) 75 MG tablet Take 1 tablet (75 mg total) by mouth daily. 06/19/16   Percell Miller Saguier, PA-C  gabapentin (NEURONTIN) 300 MG capsule Take 1 capsule (300 mg total) by mouth 3 (three) times daily. Patient taking differently: Take 300 mg by mouth daily.  06/19/16   Percell Miller Saguier, PA-C  omeprazole (PRILOSEC) 40 MG capsule Take 1 capsule (40 mg total) by mouth daily. 06/19/16   Percell Miller Saguier, PA-C  rosuvastatin (CRESTOR) 40 MG tablet Take 1 tablet (40 mg total) by mouth daily. 06/19/16   Mackie Pai, PA-C    Allergies Patient has no known allergies.   REVIEW OF SYSTEMS     PHYSICAL EXAMINATION  Initial Vital Signs Blood pressure (!) 245/121, pulse (!) 145, temperature 98 F (36.7 C), temperature source Axillary, resp. rate (!) 47, height 5\' 7"  (1.702 m), weight 140 lb (63.5 kg), SpO2 (!) 66 %.  Examination General:  Well-developed, well-nourished male in acute respiratory distress; appearance consistent with age of record HENT: normocephalic; atraumatic Eyes: pupils equal, round and reactive to light; extraocular muscles intact Neck: supple Heart: regular rate and rhythm; tachycardia Lungs: tachypnea; decreased air movement bilaterally with rales throughout Abdomen: soft; nondistended; nontender; no masses or hepatosplenomegaly; bowel sounds present Extremities: No deformity; full range of motion; pulses normal, dialysis fistula left upper arm Neurologic: lethargic; noted to move all extremities Skin: diaphoretic   RESULTS  Summary of this visit's results, reviewed by myself:   EKG Interpretation  Date/Time:  Saturday July 05 2016 22:57:42 EDT Ventricular Rate:  145 PR Interval:    QRS Duration: 96 QT Interval:  294 QTC Calculation: 457 R Axis:   30 Text Interpretation:  Sinus tachycardia LVH  with secondary repolarization abnormality Anterior infarct, old Artifact in lead(s) II III aVF and baseline wander in lead(s) V4 Rate is faster T-wave Inversions less prominent Confirmed by Talani Brazee  MD, Jenny Reichmann ([id number]) on 07/05/2016 11:41:42 PM      Laboratory Studies: Results for orders placed or performed during the hospital encounter of 07/05/16 (from the past 24 hour(s))  CBC with Differential     Status: Abnormal   Collection Time: 07/05/16 10:56 PM  Result Value Ref Range   WBC 10.2 4.0 - 10.5 K/uL   RBC 3.66 (L) 4.22 - 5.81 MIL/uL   Hemoglobin 10.6 (L) 13.0 - 17.0 g/dL   HCT 33.7 (L) 39.0 - 52.0 %   MCV 92.1 78.0 - 100.0 fL   MCH 29.0 26.0 - 34.0 pg   MCHC 31.5 30.0 - 36.0 g/dL   RDW 20.0 (H) 11.5 - 15.5 %   Platelets 352 150 - 400 K/uL   Neutrophils Relative % 44 %   Neutro Abs 4.5 1.7 - 7.7 K/uL   Lymphocytes Relative 37 %   Lymphs Abs 3.8 0.7 - 4.0 K/uL   Monocytes Relative 14 %   Monocytes Absolute 1.5 (H) 0.1 - 1.0 K/uL   Eosinophils Relative 4 %   Eosinophils Absolute 0.4 0.0 - 0.7  K/uL   Basophils Relative 1 %   Basophils Absolute 0.1 0.0 - 0.1 K/uL  Comprehensive metabolic panel     Status: Abnormal   Collection Time: 07/05/16 10:56 PM  Result Value Ref Range   Sodium 141 135 - 145 mmol/L   Potassium 3.4 (L) 3.5 - 5.1 mmol/L   Chloride 102 101 - 111 mmol/L   CO2 25 22 - 32 mmol/L   Glucose, Bld 262 (H) 65 - 99 mg/dL   BUN 28 (H) 6 - 20 mg/dL   Creatinine, Ser 6.41 (H) 0.61 - 1.24 mg/dL   Calcium 7.7 (L) 8.9 - 10.3 mg/dL   Total Protein 7.1 6.5 - 8.1 g/dL   Albumin 3.4 (L) 3.5 - 5.0 g/dL   AST 30 15 - 41 U/L   ALT 16 (L) 17 - 63 U/L   Alkaline Phosphatase 128 (H) 38 - 126 U/L   Total Bilirubin 0.3 0.3 - 1.2 mg/dL   GFR calc non Af Amer 8 (L) >60 mL/min   GFR calc Af Amer 9 (L) >60 mL/min   Anion gap 14 5 - 15  Troponin I     Status: Abnormal   Collection Time: 07/05/16 10:56 PM  Result Value Ref Range   Troponin I 0.03 (HH) <0.03 ng/mL  I-Stat Arterial Blood Gas, ED - (order at [facility] and [facility] only)     Status: Abnormal   Collection Time: 07/05/16 11:24 PM  Result Value Ref Range   pH, Arterial 7.257 (L) 7.350 - 7.450   pCO2 arterial 53.3 (H) 32.0 - 48.0 mmHg   pO2, Arterial 46.0 (L) 83.0 - 108.0 mmHg   Bicarbonate 23.7 20.0 - 28.0 mmol/L   TCO2 25 0 - 100 mmol/L   O2 Saturation 73.0 %   Acid-base deficit 4.0 (H) 0.0 - 2.0 mmol/L   Patient temperature 99.3 F    Collection site IV START    Drawn by RT    Sample type ARTERIAL    Imaging Studies: Dg Chest Port 1 View  Result Date: 07/05/2016 CLINICAL DATA:  Acute onset of respiratory distress. Initial encounter. EXAM: PORTABLE CHEST 1 VIEW COMPARISON:  Chest radiograph performed 07/02/2016 FINDINGS: The lungs are well-aerated. Vascular  congestion is noted. Increased interstitial markings raise concern for pulmonary edema. There is no evidence of pleural effusion or pneumothorax. The cardiomediastinal silhouette is borderline normal in size. A right-sided dual-lumen catheter is noted ending overlying the  right atrium. No acute osseous abnormalities are seen. IMPRESSION: Vascular congestion. Increased interstitial markings raise concern for pulmonary edema. Electronically Signed   By: Garald Balding M.D.   On: 07/05/2016 23:18    ED COURSE  Nursing notes and initial vitals signs, including pulse oximetry, reviewed.  Vitals:   07/05/16 2334 07/05/16 2345 07/05/16 2352 07/06/16 0000  BP: (!) 149/90  (!) 160/75 (!) 151/85  Pulse: 92 90 90 90  Resp: (!) 24 19 20 17   Temp:      TempSrc:      SpO2: 100% 100% 99% 100%  Weight:      Height:       11:36 PM Patient placed on BiPAP. Blood pressure heart rate noted to be significantly elevated. He was given labetalol 10 milligrams IV. His blood pressure to 149/90, pulse rate 91. His oxygen saturation is 100% endon supplemental oxygen. Mental status has improved.   12:17 AM Patient stable on BiPAP. Have discussed with Dr. Jonnie Finner of nephrology of possible need for emergent dialysis. Patient accepted for admission by Dr. Lily Kocher.  PROCEDURES   CRITICAL CARE Performed by: Shanon Rosser L Total critical care time: 60 minutes Critical care time was exclusive of separately billable procedures and treating other patients. Critical care was necessary to treat or prevent imminent or life-threatening deterioration. Critical care was time spent personally by me on the following activities: development of treatment plan with patient and/or surrogate as well as nursing, discussions with consultants, evaluation of patient's response to treatment, examination of patient, obtaining history from patient or surrogate, ordering and performing treatments and interventions, ordering and review of laboratory studies, ordering and review of radiographic studies, pulse oximetry and re-evaluation of patient's condition.   ED DIAGNOSES     ICD-9-CM ICD-10-CM   1. Flash pulmonary edema ([name]) 518.4 J81.0   2. SOB (shortness of breath) 786.05 R06.02 DG Chest [facility] 1  View     DG Chest Port 1 View  3. Hypertensive crisis 401.9 I16.9   4. Hyperglycemia 790.29 R73.9    I personally performed the services described in this documentation, which was scribed in my presence. The recorded information has been reviewed and is accurate.    Shanon Rosser, MD 07/06/16 1655    Shanon Rosser, MD 07/06/16 [phone]

## 2016-07-05 NOTE — ED Triage Notes (Signed)
Wife states pt was recently hospitalized. Was asleep tonight and had been asleep about 15 minutes. Woke wife up and told her he couldn't breathe. Pt was diaphoretic per his wife. Pt has fistula in his left arm which has not been used. Dialysis access right chest. Brought to ED via POV. Taken to ED 14. Sonia Baller, RRT and Dr. Florina Ou at bedside along with staff.

## 2016-07-06 DIAGNOSIS — I169 Hypertensive crisis, unspecified: Secondary | ICD-10-CM | POA: Diagnosis present

## 2016-07-06 DIAGNOSIS — R000 Tachycardia, unspecified: Secondary | ICD-10-CM | POA: Diagnosis present

## 2016-07-06 DIAGNOSIS — E1140 Type 2 diabetes mellitus with diabetic neuropathy, unspecified: Secondary | ICD-10-CM

## 2016-07-06 DIAGNOSIS — I2510 Atherosclerotic heart disease of native coronary artery without angina pectoris: Secondary | ICD-10-CM | POA: Diagnosis present

## 2016-07-06 DIAGNOSIS — K219 Gastro-esophageal reflux disease without esophagitis: Secondary | ICD-10-CM | POA: Diagnosis present

## 2016-07-06 DIAGNOSIS — Z833 Family history of diabetes mellitus: Secondary | ICD-10-CM | POA: Diagnosis not present

## 2016-07-06 DIAGNOSIS — E7800 Pure hypercholesterolemia, unspecified: Secondary | ICD-10-CM | POA: Diagnosis present

## 2016-07-06 DIAGNOSIS — Z8673 Personal history of transient ischemic attack (TIA), and cerebral infarction without residual deficits: Secondary | ICD-10-CM | POA: Diagnosis not present

## 2016-07-06 DIAGNOSIS — N400 Enlarged prostate without lower urinary tract symptoms: Secondary | ICD-10-CM | POA: Diagnosis present

## 2016-07-06 DIAGNOSIS — G8929 Other chronic pain: Secondary | ICD-10-CM | POA: Diagnosis present

## 2016-07-06 DIAGNOSIS — Z8249 Family history of ischemic heart disease and other diseases of the circulatory system: Secondary | ICD-10-CM | POA: Diagnosis not present

## 2016-07-06 DIAGNOSIS — J810 Acute pulmonary edema: Secondary | ICD-10-CM | POA: Diagnosis not present

## 2016-07-06 DIAGNOSIS — I5042 Chronic combined systolic (congestive) and diastolic (congestive) heart failure: Secondary | ICD-10-CM | POA: Diagnosis present

## 2016-07-06 DIAGNOSIS — E1142 Type 2 diabetes mellitus with diabetic polyneuropathy: Secondary | ICD-10-CM | POA: Diagnosis present

## 2016-07-06 DIAGNOSIS — G4730 Sleep apnea, unspecified: Secondary | ICD-10-CM | POA: Diagnosis present

## 2016-07-06 DIAGNOSIS — I160 Hypertensive urgency: Secondary | ICD-10-CM

## 2016-07-06 DIAGNOSIS — R0603 Acute respiratory distress: Secondary | ICD-10-CM | POA: Diagnosis present

## 2016-07-06 DIAGNOSIS — Z992 Dependence on renal dialysis: Secondary | ICD-10-CM | POA: Diagnosis not present

## 2016-07-06 DIAGNOSIS — E1122 Type 2 diabetes mellitus with diabetic chronic kidney disease: Secondary | ICD-10-CM | POA: Diagnosis present

## 2016-07-06 DIAGNOSIS — R0602 Shortness of breath: Secondary | ICD-10-CM

## 2016-07-06 DIAGNOSIS — I161 Hypertensive emergency: Secondary | ICD-10-CM | POA: Diagnosis present

## 2016-07-06 DIAGNOSIS — E1165 Type 2 diabetes mellitus with hyperglycemia: Secondary | ICD-10-CM | POA: Diagnosis present

## 2016-07-06 DIAGNOSIS — M545 Low back pain: Secondary | ICD-10-CM | POA: Diagnosis present

## 2016-07-06 DIAGNOSIS — I132 Hypertensive heart and chronic kidney disease with heart failure and with stage 5 chronic kidney disease, or end stage renal disease: Secondary | ICD-10-CM | POA: Diagnosis present

## 2016-07-06 DIAGNOSIS — G8191 Hemiplegia, unspecified affecting right dominant side: Secondary | ICD-10-CM | POA: Diagnosis present

## 2016-07-06 DIAGNOSIS — Z8546 Personal history of malignant neoplasm of prostate: Secondary | ICD-10-CM | POA: Diagnosis not present

## 2016-07-06 DIAGNOSIS — D649 Anemia, unspecified: Secondary | ICD-10-CM | POA: Diagnosis present

## 2016-07-06 DIAGNOSIS — N186 End stage renal disease: Secondary | ICD-10-CM | POA: Diagnosis not present

## 2016-07-06 DIAGNOSIS — Z823 Family history of stroke: Secondary | ICD-10-CM | POA: Diagnosis not present

## 2016-07-06 HISTORY — DX: Shortness of breath: R06.02

## 2016-07-06 HISTORY — DX: Hypertensive urgency: I16.0

## 2016-07-06 LAB — GLUCOSE, CAPILLARY
Glucose-Capillary: 122 mg/dL — ABNORMAL HIGH (ref 65–99)
Glucose-Capillary: 108 mg/dL — ABNORMAL HIGH (ref 65–99)
Glucose-Capillary: 166 mg/dL — ABNORMAL HIGH (ref 65–99)
Glucose-Capillary: 132 mg/dL — ABNORMAL HIGH (ref 65–99)
Glucose-Capillary: 127 mg/dL — ABNORMAL HIGH (ref 65–99)

## 2016-07-06 LAB — TROPONIN I
TROPONIN I: 0.05 ng/mL — AB (ref <0.03)
Troponin I: 0.06 ng/mL (ref <0.03)

## 2016-07-06 LAB — MRSA PCR SCREENING: MRSA by PCR: NEGATIVE

## 2016-07-06 MED ORDER — ONDANSETRON HCL 4 MG PO TABS: 4.0000 mg | ORAL_TABLET | Freq: Four times a day (QID) | ORAL | Status: DC | PRN

## 2016-07-06 MED ORDER — PENTAFLUOROPROP-TETRAFLUOROETH EX AERO: 1.0000 "application " | INHALATION_SPRAY | CUTANEOUS | Status: DC | PRN

## 2016-07-06 MED ORDER — LIDOCAINE HCL (PF) 1 % IJ SOLN: 5.0000 mL | INTRAMUSCULAR | Status: DC | PRN

## 2016-07-06 MED ORDER — GABAPENTIN 300 MG PO CAPS
300.0000 mg | ORAL_CAPSULE | Freq: Every day | ORAL | Status: DC
  Administered 2016-07-06 – 2016-07-07 (×2): 300 mg via ORAL
  Filled 2016-07-06 (×2): qty 1

## 2016-07-06 MED ORDER — ROSUVASTATIN CALCIUM 10 MG PO TABS
40.0000 mg | ORAL_TABLET | Freq: Every day | ORAL | Status: DC
  Administered 2016-07-06: 40 mg via ORAL
  Filled 2016-07-06: qty 4

## 2016-07-06 MED ORDER — SODIUM CHLORIDE 0.9 % IV SOLN: 100.0000 mL | INTRAVENOUS | Status: DC | PRN

## 2016-07-06 MED ORDER — ALTEPLASE 2 MG IJ SOLR: 2.0000 mg | Freq: Once | INTRAMUSCULAR | Status: DC | PRN

## 2016-07-06 MED ORDER — INSULIN ASPART 100 UNIT/ML ~~LOC~~ SOLN
0.0000 [IU] | SUBCUTANEOUS | Status: DC
  Administered 2016-07-06: 2 [IU] via SUBCUTANEOUS
  Administered 2016-07-06: 1 [IU] via SUBCUTANEOUS
  Administered 2016-07-07: 3 [IU] via SUBCUTANEOUS

## 2016-07-06 MED ORDER — BENZONATATE 100 MG PO CAPS: 100.0000 mg | ORAL_CAPSULE | Freq: Three times a day (TID) | ORAL | Status: DC | PRN

## 2016-07-06 MED ORDER — CLOPIDOGREL BISULFATE 75 MG PO TABS
75.0000 mg | ORAL_TABLET | Freq: Every day | ORAL | Status: DC
  Administered 2016-07-06 – 2016-07-07 (×2): 75 mg via ORAL
  Filled 2016-07-06 (×2): qty 1

## 2016-07-06 MED ORDER — ACETAMINOPHEN 650 MG RE SUPP: 650.0000 mg | Freq: Four times a day (QID) | RECTAL | Status: DC | PRN

## 2016-07-06 MED ORDER — ONDANSETRON HCL 4 MG/2ML IJ SOLN: 4.0000 mg | Freq: Four times a day (QID) | INTRAMUSCULAR | Status: DC | PRN

## 2016-07-06 MED ORDER — HEPARIN SODIUM (PORCINE) 1000 UNIT/ML DIALYSIS: 1000.0000 [IU] | INTRAMUSCULAR | Status: DC | PRN

## 2016-07-06 MED ORDER — PANTOPRAZOLE SODIUM 40 MG PO TBEC
80.0000 mg | DELAYED_RELEASE_TABLET | Freq: Every day | ORAL | Status: DC
  Administered 2016-07-06 – 2016-07-07 (×2): 80 mg via ORAL
  Filled 2016-07-06 (×2): qty 2

## 2016-07-06 MED ORDER — ACETAMINOPHEN 325 MG PO TABS: 650.0000 mg | ORAL_TABLET | Freq: Four times a day (QID) | ORAL | Status: DC | PRN

## 2016-07-06 MED ORDER — LIDOCAINE-PRILOCAINE 2.5-2.5 % EX CREA: 1.0000 "application " | TOPICAL_CREAM | CUTANEOUS | Status: DC | PRN

## 2016-07-06 MED ORDER — CARVEDILOL 3.125 MG PO TABS
3.1250 mg | ORAL_TABLET | Freq: Two times a day (BID) | ORAL | Status: DC
Start: 2016-07-06 — End: 2016-07-07
  Administered 2016-07-06 – 2016-07-07 (×2): 3.125 mg via ORAL
  Filled 2016-07-06 (×3): qty 1

## 2016-07-06 NOTE — Progress Notes (Signed)
PROGRESS NOTE    [name]  CBS:[id number] DOB: 31-Mar-1945 DOA: 07/05/2016 PCP: [name], NP  Brief Narrative: [name] is a 71 y.o. gentleman with a history of ESRD on HD MWF, HTN, DM, and prior CVA who presented to the ED in  [facility] for evaluation of acute shortness of breath at rest/dyspnea on exertion.  He denies associated chest pain, pressure, or tightness.  He has not missed any HD sessions. Of note, he was admitted less than one month ago for acute CVA.  Echo at that time showed EF of 45-50% and grade I diastolic dysfunction. ED Course: Upon arrival to the ED, patient found to have BP of 208/130.  After IV labetalol and BiPAP therapy, BP improved to 150/90    Assessment & Plan:  -Acute Pulm edema -due to loss of body weight and needing dry weight lowered -s/p BIPAP and IV labetalol, stable now -plan for HD per Renal, needs dry weight readjusted -no evidence of ACS -continue coreg, ECHO with EF of 45-50% on 06/15/16  ESRD on HD MWF -renal consulted as  Above, HD today  DM --SSI coverage AC/HS -not on meds at home  Prior CVA --continue Plavix, statin  GERD --PPI  Neuropathy --Neurontin  DVT prophylaxis: Low risk, early ambulation, outpatient status Code Status: FULL Family Communication: Wife present at bedside at time of admission. Disposition Plan: Expect he will go home at discharge.   DVT prophylaxis: Code Status:  Family Communication: Disposition Plan:   Consultants:   Renal     Subjective: feeling better  Objective: Vitals:   07/06/16 0210 07/06/16 0500 07/06/16 0800 07/06/16 0835  BP:  (!) 139/92 138/86 133/76  Pulse: 88 99 93   Resp: 18 17 14 17   Temp:  98 F (36.7 C)  98.4 F (36.9 C)  TempSrc:  Oral  Oral  SpO2: 97% 100% 100% 100%  Weight:  63.4 kg (139 lb 11.2 oz)    Height:       No intake or output data in the 24 hours ending 07/06/16 1121 Filed Weights   07/05/16 2310 07/06/16 0500  Weight: 63.5  kg (140 lb) 63.4 kg (139 lb 11.2 oz)    Examination:  General exam: Appears calm and comfortable, no distress Respiratory system: bilateral crackles  Cardiovascular system: S1 & S2 heard, RRR.  Gastrointestinal system: Abdomen is nondistended, soft and nontender.Normal bowel sounds heard. Central nervous system: Alert and oriented. No focal neurological deficits. Extremities: Symmetric 5 x 5 power, 1plus edema Skin: No rashes, lesions or ulcers Psychiatry: Judgement and insight appear normal. Mood & affect appropriate.     Data Reviewed:   CBC:  Recent Labs Lab 07/05/16 2256  WBC 10.2  NEUTROABS 4.5  HGB 10.6*  HCT 33.7*  MCV 92.1  PLT 846   Basic Metabolic Panel:  Recent Labs Lab 07/05/16 2256  NA 141  K 3.4*  CL 102  CO2 25  GLUCOSE 262*  BUN 28*  CREATININE 6.41*  CALCIUM 7.7*   GFR: Estimated Creatinine Clearance: 9.8 mL/min (A) (by C-G formula based on SCr of 6.41 mg/dL (H)). Liver Function Tests:  Recent Labs Lab 07/05/16 2256  AST 30  ALT 16*  ALKPHOS 128*  BILITOT 0.3  PROT 7.1  ALBUMIN 3.4*   No results for input(s): LIPASE, AMYLASE in the last 168 hours. No results for input(s): AMMONIA in the last 168 hours. Coagulation Profile: No results for input(s): INR, PROTIME in the last 168 hours. Cardiac Enzymes:  Recent Labs Lab 07/05/16 2256 07/06/16 0459  TROPONINI 0.03* 0.05*   BNP (last 3 results) No results for input(s): PROBNP in the last 8760 hours. HbA1C: No results for input(s): HGBA1C in the last 72 hours. CBG:  Recent Labs Lab 07/06/16 0528 07/06/16 0726  GLUCAP 122* 108*   Lipid Profile: No results for input(s): CHOL, HDL, LDLCALC, TRIG, CHOLHDL, LDLDIRECT in the last 72 hours. Thyroid Function Tests: No results for input(s): TSH, T4TOTAL, FREET4, T3FREE, THYROIDAB in the last 72 hours. Anemia Panel: No results for input(s): VITAMINB12, FOLATE, FERRITIN, TIBC, IRON, RETICCTPCT in the last 72 hours. Urine  analysis:    Component Value Date/Time   COLORURINE YELLOW 04/01/2016 [place] 04/01/2016 0939   LABSPEC >=1.030 (A) 04/01/2016 0939   PHURINE 5.5 04/01/2016 0939   GLUCOSEU NEGATIVE 04/01/2016 0939   HGBUR MODERATE (A) 04/01/2016 0939   BILIRUBINUR SMALL (A) 04/01/2016 0939   BILIRUBINUR small 06/21/2012 1041   KETONESUR TRACE (A) 04/01/2016 0939   PROTEINUR >300 (A) 12/25/2015 1028   UROBILINOGEN 0.2 04/01/2016 0939   NITRITE NEGATIVE 04/01/2016 0939   LEUKOCYTESUR NEGATIVE 04/01/2016 0939   Sepsis Labs: @LABRCNTIP (procalcitonin:4,lacticidven:4)  ) Recent Results (from the past 240 hour(s))  MRSA PCR Screening     Status: None   Collection Time: 07/06/16  2:28 AM  Result Value Ref Range Status   MRSA by PCR NEGATIVE NEGATIVE Final    Comment:        The GeneXpert MRSA Assay (FDA approved for NASAL specimens only), is one component of a comprehensive MRSA colonization surveillance program. It is not intended to diagnose MRSA infection nor to guide or monitor treatment for MRSA infections.          Radiology Studies: Dg Chest Port 1 View  Result Date: 07/05/2016 CLINICAL DATA:  Acute onset of respiratory distress. Initial encounter. EXAM: PORTABLE CHEST 1 VIEW COMPARISON:  Chest radiograph performed 07/02/2016 FINDINGS: The lungs are well-aerated. Vascular congestion is noted. Increased interstitial markings raise concern for pulmonary edema. There is no evidence of pleural effusion or pneumothorax. The cardiomediastinal silhouette is borderline normal in size. A right-sided dual-lumen catheter is noted ending overlying the right atrium. No acute osseous abnormalities are seen. IMPRESSION: Vascular congestion. Increased interstitial markings raise concern for pulmonary edema. Electronically Signed   By: Garald Balding M.D.   On: 07/05/2016 23:18        Scheduled Meds: . carvedilol  3.125 mg Oral BID WC  . clopidogrel  75 mg Oral Daily  .  gabapentin  300 mg Oral Daily  . insulin aspart  0-9 Units Subcutaneous Q4H  . pantoprazole  80 mg Oral Daily  . rosuvastatin  40 mg Oral q1800   Continuous Infusions:   LOS: 0 days    Time spent: 65min    Domenic Polite, MD [facility] Pager [phone]  If 7PM-7AM, please contact night-coverage [email or web address] Password [email or web address] 07/06/2016, 11:21 AM

## 2016-07-06 NOTE — Progress Notes (Signed)
BiPAP removed to assess patient.  Pt presents with no distress.  HR-81, RR-17, SpO2 on room air 97%. Pt noted to have fine crackles.  Pt placed on 2L for comfort.  BiPAP held at this time.

## 2016-07-06 NOTE — Procedures (Signed)
  I was present at this dialysis session, have reviewed the session itself and made  appropriate changes Kelly Splinter MD [place] pager [phone]   07/06/2016, 7:10 PM

## 2016-07-06 NOTE — ED Notes (Signed)
Report called to C. Chrisco, RN at [facility] ED. Advised to call Dr. Doy Mince on arrival.

## 2016-07-06 NOTE — Consult Note (Signed)
Renal Service Consult Note [facility]  [name] 07/06/2016 [place] Requesting Physician:  Dr. Broadus John  Reason for Consult:  ESRD pt with SOB HPI: The patient is a 71 y.o. 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 Brought to ED via POV.  RA sat was 68%, BP 210/130, was in distress.  Rec'd IV labetalol and bipap.  Transferred from [place] for admission at [facility].    Pt notes pedal edema , first time in years and endorses loose fitting pants, "I'm losing weight".  Not a big fluid drinker, goes to all HD sessions, wife is here and corroborates.  Feels much better now, no active SOB or CP, off bipap for last 4-5 hours and stable.    No tob/ etoh, lives w wife in [place].  Grew up in [place], finished 12th grade and worked as a Glass blower/designer for 39 yrs.  Had 2 kids.    ROS  denies CP  no joint pain   no HA  no blurry vision  no rash  no diarrhea  no nausea/ vomiting  no dysuria  no difficulty voiding  no change in urine color    Past Medical History  Past Medical History:  Diagnosis Date  . 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 Anemia, iron deficiency 03/24/2011   a. Recurrent GI bleed, tx with periodic iron infusions.  . 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 AVM (arteriovenous malformation) of duodenum, acquired    egds in 01/2012, 04/2011  . BPH (benign prostatic hyperplasia)   . CAD (coronary artery disease)    a. Cath 09/2012: moderate borderline CAD in mid LAD/small diagonal branch, mild RCA stenosis, to be managed medically   . Chronic lower back pain   . Diabetic peripheral neuropathy ([place]) 2014   foot pain.  . Fatty liver    on CT of 11/2010  . GERD (gastroesophageal reflux disease)   . GI bleed    a. Recurrent GI bleed, tx with IV iron. b. Per heme notes -  likely AVMs 04/2012 (tx with cauterization several months ago).  . Hematuria    a. Urology note scan from 07/2012: cystoscopy without evidence for bladder lesion, only lateral hypertrophy of posterior urethra, bladder impression from BPH. b. Pt states he had "some tests" scheduled for later in July 2014.  . High cholesterol   . Hypertension   . 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 Orthostatic hypotension    a. Tx with florinef.  . Peripheral neuropathy   . Polyp, colonic    Colonoscopy 01/2012 "benign" polyp  . Prostate cancer ([place])   . Sleep apnea    "had mask; couldn't sleep in it" (09/20/2012)  . Smoker   . Stage III chronic kidney disease    a. Stage 3 (DM with complications ->CKD, peripheral neuropathy).  . Stroke ([place])    x 2  . Type II diabetes mellitus ([place])    a. Dx 1994, uncontrolled.    Past Surgical History  Past Surgical History:  Procedure Laterality Date  . ENTEROSCOPY N/A 03/01/2013   Procedure: ENTEROSCOPY;  Surgeon: Inda Castle, MD;  Location: [place];  Service: Endoscopy;  Laterality: N/A;  . ENTEROSCOPY N/A 06/26/2016   Procedure: ENTEROSCOPY;  Surgeon: Milus Banister, MD;  Location: [place];  Service:  Endoscopy;  Laterality: N/A;  this is a push enteroscopy  . GIVENS CAPSULE STUDY N/A 04/05/2015   Procedure: GIVENS CAPSULE STUDY;  Surgeon: Gatha Mayer, MD;  Location: [place];  Service: Endoscopy;  Laterality: N/A;  . INGUINAL HERNIA REPAIR Right 2011  . LEFT HEART CATHETERIZATION WITH CORONARY ANGIOGRAM N/A 09/21/2012   Procedure: LEFT HEART CATHETERIZATION WITH CORONARY ANGIOGRAM;  Surgeon: Peter M Martinique, MD;  Location: [facility] C[facility] LAB;  Service: Cardiovascular;  Laterality: N/A;  . LUMBAR DISC SURGERY  1980's  . LYMPHADENECTOMY Bilateral 01/19/2013   Procedure: LYMPHADENECTOMY;  Surgeon: Bernestine Amass, MD;  Location: [facility];  Service: Urology;  Laterality: Bilateral;  . ROBOT ASSISTED LAPAROSCOPIC RADICAL PROSTATECTOMY N/A  01/19/2013   Procedure: ROBOTIC ASSISTED LAPAROSCOPIC RADICAL PROSTATECTOMY;  Surgeon: Bernestine Amass, MD;  Location: [facility];  Service: Urology;  Laterality: N/A;  . SHOULDER OPEN ROTATOR CUFF REPAIR Left 1980's   Family History  Family History  Problem Relation Age of Onset  . Stroke Father     Died at 58  . Diabetes Mother   . Heart attack Brother     Died at 29  . Diabetes Sister   . Stomach cancer Brother   . Heart attack Sister    Social History  reports that he has been smoking Cigarettes.  He started smoking about 48 years ago. He has a 43.00 pack-year smoking history. He has never used smokeless tobacco. He reports that he does not drink alcohol or use drugs. Allergies No Known Allergies Home medications Prior to Admission medications   Medication Sig Start Date End Date Taking? Authorizing Provider  benzonatate (TESSALON) 100 MG capsule Take 1 capsule (100 mg total) by mouth 3 (three) times daily as needed for cough. 04/30/16   Percell Miller Saguier, PA-C  carvedilol (COREG) 3.125 MG tablet Take 1 tablet (3.125 mg total) by mouth 2 (two) times daily with a meal. 06/19/16   Mackie Pai, PA-C  clopidogrel (PLAVIX) 75 MG tablet Take 1 tablet (75 mg total) by mouth daily. 06/19/16   Percell Miller Saguier, PA-C  gabapentin (NEURONTIN) 300 MG capsule Take 1 capsule (300 mg total) by mouth 3 (three) times daily. Patient taking differently: Take 300 mg by mouth daily.  06/19/16   Percell Miller Saguier, PA-C  omeprazole (PRILOSEC) 40 MG capsule Take 1 capsule (40 mg total) by mouth daily. 06/19/16   Percell Miller Saguier, PA-C  rosuvastatin (CRESTOR) 40 MG tablet Take 1 tablet (40 mg total) by mouth daily. 06/19/16   Mackie Pai, PA-C   Liver Function Tests  Recent Labs Lab 07/05/16 2256  AST 30  ALT 16*  ALKPHOS 128*  BILITOT 0.3  PROT 7.1  ALBUMIN 3.4*   No results for input(s): LIPASE, AMYLASE in the last 168 hours. CBC  Recent Labs Lab 07/05/16 2256  WBC 10.2  NEUTROABS 4.5  HGB 10.6*  HCT 33.7*   MCV 92.1  PLT 353   Basic Metabolic Panel  Recent Labs Lab 07/05/16 2256  NA 141  K 3.4*  CL 102  CO2 25  GLUCOSE 262*  BUN 28*  CREATININE 6.41*  CALCIUM 7.7*   Iron/TIBC/Ferritin/ %Sat    Component Value Date/Time   IRON 61 12/13/2015 0901   TIBC 205 12/13/2015 0901   FERRITIN 376 (H) 12/13/2015 0901   IRONPCTSAT 30 12/13/2015 0901   IRONPCTSAT 8 (L) 06/29/2014 0811    Vitals:   07/06/16 0210 07/06/16 0500 07/06/16 0800 07/06/16 0835  BP:  (!) 139/92 138/86 133/76  Pulse:  88 99 93   Resp: 18 17 14 17   Temp:  98 F (36.7 C)  98.4 F (36.9 C)  TempSrc:  Oral  Oral  SpO2: 97% 100% 100% 100%  Weight:  63.4 kg (139 lb 11.2 oz)    Height:       Exam Gen alert, calm, no distress, pleasant No rash, cyanosis or gangrene Sclera anicteric, throat clear  No jvd or bruits Chest bilat basilar crackle, no wheezing , no ^wob RRR no MRG Abd soft ntnd no mass or ascites +bs scaphoid GU normal male MS no joint effusions or deformity Ext no LE or UE edema / no wounds or ulcers Neuro is alert, Ox 3 , nf L forearm AVF, not well matured, +bruit R IJ HD cath   Dialysis: [facility] MWF   Assessment: 1. Resp distress/ acute pulm edema - presumably from vol overload related to lean body wt loss, vs HTN crisis and flash pulm edema.  Better after IV labetalol and bipap.  Will plan on HD for today, prob later as we have other emergencies and limited Sunday staff.   2. ESRD usual HD MWF started Jul '17 3. HTN on coreg only at home 4. DM2 off insulin now 5. Hx CVA/ R hemiparesis - takes plavix 6. HL 7. Hx recur GIB/ AVM's    Plan - as above  Kelly Splinter MD [facility] pager [phone]   07/06/2016, 9:41 AM

## 2016-07-06 NOTE — H&P (Signed)
History and Physical    ABISHAI VIEGAS PIR:[id number] DOB: Jun 26, 1945 DOA: 07/05/2016  PCP: [name], NP   Patient coming from: [facility]  Chief Complaint: Shortness of breath  HPI: [name] is a 71 y.o. gentleman with a history of ESRD on HD MWF, HTN, DM, and prior CVA who presented to the ED in [facility] for evaluation of acute shortness of breath at rest/dyspnea on exertion.  He denies associated chest pain, pressure, or tightness.  No light-headedness, no syncope.  No recent changes to home medications.  He has not missed any HD sessions.  No recent changes to diet including fluid intake or salt restrictions.  He has had bilateral ankle edema.  Of note, he was admitted less than one month ago for acute CVA.  Echo at that time showed EF of 45-50% and grade I diastolic dysfunction.  ED Course: Upon arrival to the ED, patient found to have BP of 208/130.  After IV labetalol and BiPAP therapy, BP improved to 150/90 (which is close to his baseline according to his wife.  Dr. Jonnie Finner called from the ED.  Hospitalist asked to admit.  Review of Systems: As per HPI otherwise 10 systems reviewed and negative.   Past Medical History:  Diagnosis Date  . 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 Anemia, iron deficiency 03/24/2011   a. Recurrent GI bleed, tx with periodic iron infusions.  . 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 AVM (arteriovenous malformation) of duodenum, acquired    egds in 01/2012, 04/2011  . BPH (benign prostatic hyperplasia)   . CAD (coronary artery disease)    a. Cath 09/2012: moderate borderline CAD in mid LAD/small diagonal branch, mild RCA stenosis, to be managed medically   . Chronic lower back pain   . Diabetic peripheral neuropathy ([place]) 2014   foot pain.  . Fatty liver    on CT of 11/2010  . GERD (gastroesophageal reflux disease)   . GI bleed    a. Recurrent GI bleed, tx with IV iron. b. Per heme notes - likely AVMs 04/2012 (tx with cauterization  several months ago).  . Hematuria    a. Urology note scan from 07/2012: cystoscopy without evidence for bladder lesion, only lateral hypertrophy of posterior urethra, bladder impression from BPH. b. Pt states he had "some tests" scheduled for later in July 2014.  . High cholesterol   . Hypertension   . 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 Orthostatic hypotension    a. Tx with florinef.  . Peripheral neuropathy   . Polyp, colonic    Colonoscopy 01/2012 "benign" polyp  . Prostate cancer ([place])   . Sleep apnea    "had mask; couldn't sleep in it" (09/20/2012)  . Smoker   . Stage III chronic kidney disease    a. Stage 3 (DM with complications ->CKD, peripheral neuropathy).  . Stroke ([place])    x 2  . Type II diabetes mellitus ([place])    a. Dx 1994, uncontrolled.     Past Surgical History:  Procedure Laterality Date  . ENTEROSCOPY N/A 03/01/2013   Procedure: ENTEROSCOPY;  Surgeon: Inda Castle, MD;  Location: [place];  Service: Endoscopy;  Laterality: N/A;  . ENTEROSCOPY N/A 06/26/2016   Procedure: ENTEROSCOPY;  Surgeon: Milus Banister, MD;  Location: [place];  Service: Endoscopy;  Laterality: N/A;  this is a push enteroscopy  . GIVENS CAPSULE STUDY N/A 04/05/2015   Procedure: GIVENS CAPSULE STUDY;  Surgeon: Gatha Mayer,  MD;  Location: [place] [facility];  Service: Endoscopy;  Laterality: N/A;  . INGUINAL HERNIA REPAIR Right 2011  . LEFT HEART CATHETERIZATION WITH CORONARY ANGIOGRAM N/A 09/21/2012   Procedure: LEFT HEART CATHETERIZATION WITH CORONARY ANGIOGRAM;  Surgeon: Peter M Martinique, MD;  Location: [facility] CATH LAB;  Service: Cardiovascular;  Laterality: N/A;  . LUMBAR DISC SURGERY  1980's  . LYMPHADENECTOMY Bilateral 01/19/2013   Procedure: LYMPHADENECTOMY;  Surgeon: Bernestine Amass, MD;  [facility];  Service: Urology;  Laterality: Bilateral;  . ROBOT ASSISTED LAPAROSCOPIC RADICAL PROSTATECTOMY N/A 01/19/2013   Procedure: ROBOTIC ASSISTED LAPAROSCOPIC RADICAL  PROSTATECTOMY;  Surgeon: Bernestine Amass, MD;  [facility];  Service: Urology;  Laterality: N/A;  . SHOULDER OPEN ROTATOR CUFF REPAIR Left 1980's     reports that he has been smoking Cigarettes.  He started smoking about 48 years ago. He has a 43.00 pack-year smoking history. He has never used smokeless tobacco. He reports that he does not drink alcohol or use drugs. He is married.  No Known Allergies  Family History  Problem Relation Age of Onset  . Stroke Father     Died at 82  . Diabetes Mother   . Heart attack Brother     Died at 81  . Diabetes Sister   . Stomach cancer Brother   . Heart attack Sister      Prior to Admission medications   Medication Sig Start Date End Date Taking? Authorizing Provider  benzonatate (TESSALON) 100 MG capsule Take 1 capsule (100 mg total) by mouth 3 (three) times daily as needed for cough. 04/30/16   Percell Miller Saguier, PA-C  carvedilol (COREG) 3.125 MG tablet Take 1 tablet (3.125 mg total) by mouth 2 (two) times daily with a meal. 06/19/16   Mackie Pai, PA-C  clopidogrel (PLAVIX) 75 MG tablet Take 1 tablet (75 mg total) by mouth daily. 06/19/16   Percell Miller Saguier, PA-C  gabapentin (NEURONTIN) 300 MG capsule Take 1 capsule (300 mg total) by mouth 3 (three) times daily. Patient taking differently: Take 300 mg by mouth daily.  06/19/16   Percell Miller Saguier, PA-C  omeprazole (PRILOSEC) 40 MG capsule Take 1 capsule (40 mg total) by mouth daily. 06/19/16   Percell Miller Saguier, PA-C  rosuvastatin (CRESTOR) 40 MG tablet Take 1 tablet (40 mg total) by mouth daily. 06/19/16   Mackie Pai, PA-C    Physical Exam: Vitals:   07/06/16 0000 07/06/16 0030 07/06/16 0200 07/06/16 0210  BP: (!) 151/85 138/80 (!) 146/95   Pulse: 90 91  88  Resp: 17 17  18   Temp:   97.8 F (36.6 C)   TempSrc:   Axillary   SpO2: 100% 99% 94% 97%  Weight:      Height:   5\' 7"  (1.702 m)       Constitutional: NAD, calm, comfortable.  Bipap has been removed. Vitals:   07/06/16 0000  07/06/16 0030 07/06/16 0200 07/06/16 0210  BP: (!) 151/85 138/80 (!) 146/95   Pulse: 90 91  88  Resp: 17 17  18   Temp:   97.8 F (36.6 C)   TempSrc:   Axillary   SpO2: 100% 99% 94% 97%  Weight:      Height:   5\' 7"  (1.702 m)    Eyes: PERRL, lids and conjunctivae normal ENMT: Mucous membranes are moist. Posterior pharynx clear of any exudate or lesions. Missing several teeth. Neck: normal appearance, supple, no masses Respiratory: clear to auscultation bilaterally, no wheezing, no crackles. Normal respiratory effort.  No accessory muscle use.  Cardiovascular: Normal rate, regular rhythm, no murmurs / rubs / gallops. No extremity edema. 2+ pedal pulses. GI: abdomen is soft and compressible.  No distention.  No tenderness.  Bowel sounds are present. Musculoskeletal:  No joint deformity in upper and lower extremities. Good ROM, no contractures. Normal muscle tone.  Skin: no rashes, warm and dry Neurologic: No focal deficits. Psychiatric: Normal judgment and insight. Alert and oriented x 3. Normal mood.     Labs on Admission: I have personally reviewed following labs and imaging studies  CBC:  Recent Labs Lab 07/05/16 2256  WBC 10.2  NEUTROABS 4.5  HGB 10.6*  HCT 33.7*  MCV 92.1  PLT 322   Basic Metabolic Panel:  Recent Labs Lab 07/05/16 2256  NA 141  K 3.4*  CL 102  CO2 25  GLUCOSE 262*  BUN 28*  CREATININE 6.41*  CALCIUM 7.7*   GFR: Estimated Creatinine Clearance: 9.8 mL/min (A) (by C-G formula based on SCr of 6.41 mg/dL (H)). Liver Function Tests:  Recent Labs Lab 07/05/16 2256  AST 30  ALT 16*  ALKPHOS 128*  BILITOT 0.3  PROT 7.1  ALBUMIN 3.4*   Cardiac Enzymes:  Recent Labs Lab 07/05/16 2256  TROPONINI 0.03*   Radiological Exams on Admission: Dg Chest Port 1 View  Result Date: 07/05/2016 CLINICAL DATA:  Acute onset of respiratory distress. Initial encounter. EXAM: PORTABLE CHEST 1 VIEW COMPARISON:  Chest radiograph performed 07/02/2016  FINDINGS: The lungs are well-aerated. Vascular congestion is noted. Increased interstitial markings raise concern for pulmonary edema. There is no evidence of pleural effusion or pneumothorax. The cardiomediastinal silhouette is borderline normal in size. A right-sided dual-lumen catheter is noted ending overlying the right atrium. No acute osseous abnormalities are seen. IMPRESSION: Vascular congestion. Increased interstitial markings raise concern for pulmonary edema. Electronically Signed   By: Garald Balding M.D.   On: 07/05/2016 23:18    EKG: Independently reviewed. Sinus tachycardia with LVH and repolarization abnormality.  Assessment/Plan Principal Problem:   Hypertensive emergency Active Problems:   Type 2 diabetes, uncontrolled, with neuropathy ([name])   Essential hypertension   ESRD (end stage renal disease) ([name])   Flash pulmonary edema ([name])   SOB (shortness of breath)      Flash pulmonary edema in the setting of hypertensive emergency, evidence of CHF on recent echo.  HD patient. --Improved after IV labetalol and BiPAP.  OFF BiPAP since transfer.  Stable --Serial troponin --NPO for now.  Consider further evaluation testing in the AM. --Continue coreg for now; may need additional blood pressure medication  DM --SSI coverage AC/HS  Prior CVA --Plavix, statin  GERD --PPI  Neuropathy --Neurontin   DVT prophylaxis: Low risk, early ambulation, outpatient status Code Status: FULL Family Communication: Wife present at bedside at time of admission. Disposition Plan: Expect he will go home at discharge. Consults called: Nephrology Admission status: Place in observation, stepdown unit.   TIME SPENT: 60 minutes   Eber Jones MD [facility] Pager [phone]  If 7PM-7AM, please contact night-coverage [email or web address] Password [email or web address]  07/06/2016, 4:21 AM

## 2016-07-06 NOTE — Progress Notes (Signed)
Patient accepted to stepdown unit at [facility] for management of hypertensive emergency with flash pulmonary edema.  History of ESRD on HD MWF.  Last dialysis yesterday.  Presented to ED in [facility] with BP 208/130, tachycardia, tachypnea, and O2 sats 68% on RA.  Improved after IV labetalol and BiPAP therapy.  Remains on BiPAP.  BP now 150/90 with normal resting heart rate.  Dr. Jonnie Finner with nephrology has been contacted about this patient.  Hospitalist to admit.  Accepted to stepdown bed.

## 2016-07-07 DIAGNOSIS — N186 End stage renal disease: Secondary | ICD-10-CM

## 2016-07-07 DIAGNOSIS — J810 Acute pulmonary edema: Secondary | ICD-10-CM

## 2016-07-07 LAB — BASIC METABOLIC PANEL
ANION GAP: 10 (ref 5–15)
BUN: 15 mg/dL (ref 6–20)
CHLORIDE: 102 mmol/L (ref 101–111)
CO2: 25 mmol/L (ref 22–32)
Calcium: 7.8 mg/dL — ABNORMAL LOW (ref 8.9–10.3)
Creatinine, Ser: 5.2 mg/dL — ABNORMAL HIGH (ref 0.61–1.24)
GFR calc Af Amer: 12 mL/min — ABNORMAL LOW (ref >60)
GFR, EST NON AFRICAN AMERICAN: 10 mL/min — AB (ref >60)
GLUCOSE: 150 mg/dL — AB (ref 65–99)
POTASSIUM: 4.6 mmol/L (ref 3.5–5.1)
Sodium: 137 mmol/L (ref 135–145)

## 2016-07-07 LAB — CBC
HCT: 33.3 % — ABNORMAL LOW (ref 39.0–52.0)
HEMOGLOBIN: 11 g/dL — AB (ref 13.0–17.0)
MCH: 29.3 pg (ref 26.0–34.0)
MCHC: 33 g/dL (ref 30.0–36.0)
MCV: 88.8 fL (ref 78.0–100.0)
PLATELETS: 250 10*3/uL (ref 150–400)
RBC: 3.75 MIL/uL — AB (ref 4.22–5.81)
RDW: 20.6 % — ABNORMAL HIGH (ref 11.5–15.5)
WBC: 6 10*3/uL (ref 4.0–10.5)

## 2016-07-07 LAB — GLUCOSE, CAPILLARY
GLUCOSE-CAPILLARY: 224 mg/dL — AB (ref 65–99)
Glucose-Capillary: 117 mg/dL — ABNORMAL HIGH (ref 65–99)

## 2016-07-07 MED ORDER — GABAPENTIN 300 MG PO CAPS: 300.0000 mg | ORAL_CAPSULE | Freq: Every day | ORAL | 0 refills | Status: DC

## 2016-07-07 NOTE — Plan of Care (Signed)
Problem: Health Behavior/Discharge Planning: Goal: Ability to manage health-related needs will improve Outcome: Adequate for Discharge All discharge paperwork given to patient and wife in written format ,

## 2016-07-07 NOTE — Progress Notes (Signed)
Pt. Discharged home with wife. Patient provided information on medications and discharge instructions. Pt. Given oral and handout instructions. Pt was able to verbalize understanding and provide teach back.

## 2016-07-07 NOTE — Progress Notes (Addendum)
Patient ID: [name], male   DOB: June 13, 1945, 71 y.o.   MRN: [id number]  [name] Progress Note    Subjective:   Feels much better today.  S/p HD 07/06/16 with 3.1kg uf.  His wife reports that his edw is 75 but he was 132.5lbs after HD yesterday.  Pox 100% on RA   Objective:   BP 126/76   Pulse 92   Temp 98.7 F (37.1 C) (Oral)   Resp 18   Ht 5\' 7"  (1.702 m)   Wt 60.6 kg (133 lb 9.6 oz)   SpO2 100%   BMI 20.92 kg/m   Intake/Output: I/O last 3 completed shifts: In: 240 [P.O.:240] Out: 3000 [Other:3000]   Intake/Output this shift:  No intake/output data recorded. Weight change: -0.404 kg (-14.2 oz)  Physical Exam: Gen:WD WN [name] in NAD CVS:no rub Resp:fine crackles bibasilar WUJ:[id number] Ext:no edema, LAVF +T/B, RIJ TDC  Labs: BMET  Recent Labs Lab 07/05/16 2256  NA 141  K 3.4*  CL 102  CO2 25  GLUCOSE 262*  BUN 28*  CREATININE 6.41*  ALBUMIN 3.4*  CALCIUM 7.7*   CBC  Recent Labs Lab 07/05/16 2256  WBC 10.2  NEUTROABS 4.5  HGB 10.6*  HCT 33.7*  MCV 92.1  PLT 352    @IMGRELPRIORS @ Medications:    . carvedilol  3.125 mg Oral BID WC  . clopidogrel  75 mg Oral Daily  . gabapentin  300 mg Oral Daily  . insulin aspart  0-9 Units Subcutaneous Q4H  . pantoprazole  80 mg Oral Daily  . rosuvastatin  40 mg Oral q1800     Assessment/ Plan:   1. Flash Pulmonary edema with HTN- improved with HD/UF.  Likely has lost body weight and will need to challenge his edw.  Was 63kg would adjust to 60kg as an outpatient and ok for discharge and f/u with [facility] on Wednesday 2. ESRD continue with HD qMWF with weight adjustment as above 3. Anemia: resume outpatient ESA therapy 4. CKD-MBD: stable 5. Nutrition:renal diet, fluid restrict of 1266ml/day 6. Hypertension: improved after UF 7. Disposition- stable for discharge from renal standpoint and f/u with his primary Nephrologist and HD unit on Wednesday with new lower edw.  If he stays  for other issues, will cont with MWF HD and challenge edw as above.  Donetta Potts, MD [place] Pager [phone] 07/07/2016, 9:11 AM

## 2016-07-07 NOTE — Care Management Note (Addendum)
Case Management Note  Patient Details  Name: [name] MRN: [id number] Date of Birth: 02-17-1946  Subjective/Objective:   Pt presented for SOB, Hypertensive urgency- has not missed his HD sessions. Pt is from home with family support and is active with [facility] for RN, PT, OT Services. Pt will need resumption orders and F2F once stable. Pt uses [facility].                 Action/Plan: CM will continue to monitor for additional needs.   Expected Discharge Date:                  Expected Discharge Plan:  [place]  In-House Referral:  NA  Discharge planning Services  CM Consult  Post Acute Care Choice:  Home Health, Resumption of Svcs/PTA Provider Choice offered to:  Patient  DME Arranged:  N/A DME Agency:  NA  HH Arranged:  RN, PT, OT HH Agency:  [place]  Status of Service:  Completed, signed off  If discussed at [place] of Stay Meetings, dates discussed:    Additional Comments:  Bethena Roys, RN 07/07/2016, 10:54 AM

## 2016-07-07 NOTE — Discharge Summary (Signed)
Physician Discharge Summary  [name]:[id number] DOB: 1945/04/27 DOA: 07/05/2016  PCP: [name], NP  Admit date: 07/05/2016 Discharge date: 07/07/2016  Time spent: 35 minutes  Recommendations for Outpatient Follow-up:  1. PCP in 1 week, Dry weight lowered to 60Kg   Discharge Diagnoses:  Principal Problem:   Hypertensive emergency   Pulmonary edema   Type 2 diabetes, uncontrolled, with neuropathy ([place])   Essential hypertension   ESRD (end stage renal disease) ([place])   Flash pulmonary edema ([name])   SOB (shortness of breath)   Hypertensive urgency   Discharge Condition: stable  Diet recommendation: renal diabetic  Filed Weights   07/06/16 1708 07/06/16 2038 07/07/16 0534  Weight: 63.1 kg (139 lb 1.8 oz) 60.1 kg (132 lb 7.9 oz) 60.6 kg (133 lb 9.6 oz)    History of present illness:  [name] a 71 y.o.gentleman with a history of ESRD on HD MWF, HTN, DM, and prior CVA who presented to the ED in [facility] for evaluation of acute shortness of breath at rest/dyspnea on exertion. He denies associated chest pain, pressure, or tightness. He has not missed any HD sessions. Of note, he was admitted less than one month ago for acute CVA. Echo at that time showed EF of 45-50% and grade I diastolic dysfunction  Hospital Course:   -Acute Pulm edema -due to loss of body weight and needing dry weight lowered -s/p BIPAP and IV labetalol, improved, s/p urgent HD yesterday -per Renal, dry weight lowered to 60kg -no evidence of ACS -continue coreg, ECHO with EF of 45-50% on 06/15/16 -stable for discharge from renal standpoint and advised next HD at [facility] on Wednesday  ESRD on HD MWF -HD resumed as above, dry weight lowered to 60kg  DM -not on meds at home  Prior CVA --continue Plavix, statin  GERD --PPI  Neuropathy --Neurontin  Consultations:  Renal  Discharge Exam: Vitals:   07/07/16 1111 07/07/16 1207  BP: 118/73   Pulse: 93    Resp: 19 17  Temp: 98.6 F (37 C)     General:AAOx3 Cardiovascular: S1S2/RRR Respiratory: CTAB  Discharge Instructions   Discharge Instructions    Discharge instructions    Complete by:  As directed    Renal diet   Increase activity slowly    Complete by:  As directed      Discharge Medication List as of 07/07/2016  1:29 PM    CONTINUE these medications which have CHANGED   Details  gabapentin (NEURONTIN) 300 MG capsule Take 1 capsule (300 mg total) by mouth at bedtime., Starting Mon 07/07/2016, Print      CONTINUE these medications which have NOT CHANGED   Details  benzonatate (TESSALON) 100 MG capsule Take 1 capsule (100 mg total) by mouth 3 (three) times daily as needed for cough., Starting Wed 04/30/2016, Normal    Carboxymethylcellul-Glycerin (CLEAR EYES FOR DRY EYES) 1-0.25 % SOLN Place 1 drop into both eyes daily as needed (dry eyes)., Historical Med    carvedilol (COREG) 3.125 MG tablet Take 1 tablet (3.125 mg total) by mouth 2 (two) times daily with a meal., Starting Thu 06/19/2016, Print    clopidogrel (PLAVIX) 75 MG tablet Take 1 tablet (75 mg total) by mouth daily., Starting Thu 06/19/2016, Print    omeprazole (PRILOSEC) 40 MG capsule Take 1 capsule (40 mg total) by mouth daily., Starting Thu 06/19/2016, Normal    rosuvastatin (CRESTOR) 40 MG tablet Take 1 tablet (40 mg total) by mouth daily.,  Starting Thu 06/19/2016, Print       No Known Allergies Follow-up Information    [facility] Follow up.   Why:  Registered Nurse, Physical / Occupational Therapy.  Contact information: [place] [address] [phone]        [name], NP. Schedule an appointment as soon as possible for a visit in 1 week(s).   Specialty:  Internal Medicine Contact information: [address] [place] [address] [phone]            The results of significant diagnostics from this hospitalization  (including imaging, microbiology, ancillary and laboratory) are listed below for reference.    Significant Diagnostic Studies: Dg Chest 2 View  Result Date: 06/13/2016 CLINICAL DATA:  Initial evaluation for acute stroke. EXAM: CHEST  2 VIEW COMPARISON:  Prior radiograph from 04/30/2016. FINDINGS: Dual lumen right-sided central venous catheter in place with tip overlying the cavoatrial junction. Cardiac and mediastinal silhouettes are stable, and remain within normal limits. Mild aortic atherosclerosis. Lungs normally inflated. No focal infiltrate, pulmonary edema, or pleural effusion. No pneumothorax. No acute osseous abnormality. IMPRESSION: 1. No active cardiopulmonary disease. 2. Mild aortic atherosclerosis. Electronically Signed   By: Jeannine Boga M.D.   On: 06/13/2016 18:10   Ct Head Wo Contrast  Result Date: 06/23/2016 CLINICAL DATA:  Initial evaluation for numbness with slurred speech EXAM: CT HEAD WITHOUT CONTRAST TECHNIQUE: Contiguous axial images were obtained from the base of the skull through the vertex without intravenous contrast. COMPARISON:  Recent MRI and CT from 06/13/2016. FINDINGS: Brain: Stable atrophy with chronic microvascular ischemic disease. Superimposed remote lacunar infarcts present within the right basal ganglia. Recently identified right thalamic infarct not well visualized. No acute intracranial hemorrhage. No evidence for new acute large vessel territory infarct. No mass lesion, midline shift or mass effect. No hydrocephalus. No extra-axial fluid collection. Vascular: No asymmetric hyper for dense vessel. Scattered vascular calcifications noted within the carotid siphons. Skull: Scalp soft tissues within normal limits.  Calvarium intact. Sinuses/Orbits: Globes normal soft tissues normal. Paranasal sinuses and mastoid air cells are clear. Other: None. IMPRESSION: 1. No acute intracranial process. Recently identified right thalamic lacunar infarct not well seen. 2.  Stable atrophy with chronic microvascular ischemic disease. Multiple remote lacunar infarcts within the bilateral basal ganglia, stable. Electronically Signed   By: Jeannine Boga M.D.   On: 06/23/2016 17:37   Ct Head Wo Contrast  Result Date: 06/13/2016 CLINICAL DATA:  Right leg weakness this morning. EXAM: CT HEAD WITHOUT CONTRAST TECHNIQUE: Contiguous axial images were obtained from the base of the skull through the vertex without intravenous contrast. COMPARISON:  Head CT 11/27/2015 FINDINGS: Brain: Chronic lacunar infarcts in the basal basal ganglia and periventricular white matter, stable from prior exam. Generalized atrophy and chronic small vessel ischemia. No intracranial hemorrhage, mass effect, or midline shift. No hydrocephalus. The basilar cisterns are patent. No evidence of territorial infarct. No intracranial fluid collection. Vascular: Atherosclerosis of skullbase vasculature without hyperdense vessel or abnormal calcification. Skull: Normal. Negative for fracture or focal lesion. Sinuses/Orbits: Paranasal sinuses and mastoid air cells are clear. The visualized orbits are unremarkable. Other: None. IMPRESSION: 1.  No acute intracranial abnormality. 2. Stable atrophy, chronic and remote ischemia. Electronically Signed   By: Jeb Levering M.D.   On: 06/13/2016 06:45   [name] Contrast (neuro Protocol)  Result Date: 06/13/2016 CLINICAL DATA:  Right leg weakness.  Groin pain. EXAM: MRI HEAD WITHOUT CONTRAST TECHNIQUE:  Multiplanar, multiecho pulse sequences of the brain and surrounding structures were obtained without intravenous contrast. COMPARISON:  Head CT 06/13/2016 and MRI 04/04/2015 FINDINGS: Brain: There are 2 subcentimeter foci of acute infarction involving the lateral margin of the right thalamus/adjacent deep white matter. There is moderate cerebral atrophy. Patchy to confluent T2 hyperintensities in the subcortical and deep cerebral white matter and pons have mildly  progressed from the prior MRI and are compatible with moderate chronic small vessel ischemic disease. Chronic lacunar infarcts are present in the basal ganglia, thalami, and deep cerebral white matter bilaterally. Numerous foci of chronic microhemorrhage are again seen in the deep gray nuclei and bilateral cerebral and cerebellar hemispheres, mildly increased in number from the prior MRI and likely reflecting chronic hypertension. No mass, midline shift, or extra-axial fluid collection is seen. Vascular: Major intracranial vascular flow voids are preserved. Skull and upper cervical spine: Heterogeneously diminished bone marrow signal intensity in the skull and visualized cervical spine likely reflecting chronic renal failure and anemia. Sinuses/Orbits: Unremarkable orbits. Paranasal sinuses and mastoid air cells are clear. Other: None. IMPRESSION: 1. Punctate acute infarcts along the lateral margin of the right thalamus. 2. Moderate chronic small vessel ischemic disease. Electronically Signed   By: Logan Bores M.D.   On: 06/13/2016 16:57   Dg Chest Port 1 View  Result Date: 07/05/2016 CLINICAL DATA:  Acute onset of respiratory distress. Initial encounter. EXAM: PORTABLE CHEST 1 VIEW COMPARISON:  Chest radiograph performed 07/02/2016 FINDINGS: The lungs are well-aerated. Vascular congestion is noted. Increased interstitial markings raise concern for pulmonary edema. There is no evidence of pleural effusion or pneumothorax. The cardiomediastinal silhouette is borderline normal in size. A right-sided dual-lumen catheter is noted ending overlying the right atrium. No acute osseous abnormalities are seen. IMPRESSION: Vascular congestion. Increased interstitial markings raise concern for pulmonary edema. Electronically Signed   By: Garald Balding M.D.   On: 07/05/2016 23:18   Mr Jodene Nam Head/brain ZO Cm  Result Date: 06/13/2016 CLINICAL DATA:  Initial evaluation for acute stroke. EXAM: MRA HEAD WITHOUT CONTRAST  TECHNIQUE: Angiographic images of the Circle of Willis were obtained using MRA technique without intravenous contrast. COMPARISON:  Prior MRI from earlier same day. FINDINGS: ANTERIOR CIRCULATION: Distal cervical segments of the internal carotid arteries are patent with antegrade flow. Petrous segments widely patent bilaterally. Atheromatous irregularity throughout the cavernous ICAs without flow-limiting stenosis. Changes slightly worse on the left were there is mild to moderate narrowing at the anterior genu. ICA termini widely patent. A1 segments patent bilaterally without stenosis. Anterior communicating artery normal. Anterior cerebral arteries demonstrate mild atheromatous irregularity without flow-limiting stenosis. PCAs are patent to their distal aspects. Mild atheromatous irregularity within the M1 segments without flow-limiting stenosis. MCA bifurcations within normal limits. No proximal M2 occlusion. Distal MCA branches well opacified and symmetric. POSTERIOR CIRCULATION: Vertebral arteries patent to the vertebrobasilar junction. Posterior inferior cerebral arteries patent bilaterally. Basilar artery widely patent. Superior cerebellar arteries patent bilaterally. Both of the posterior cerebral artery is predominantly supplied via the basilar and are widely patent to their distal aspects. Small right posterior communicating artery noted. No aneurysm or vascular malformation. IMPRESSION: Mild atherosclerotic changes involving the intracranial circulation as above. No large vessel occlusion identified. No high-grade or correctable stenosis. Electronically Signed   By: Jeannine Boga M.D.   On: 06/13/2016 21:04   Dg Hip Unilat W Or Wo Pelvis 2-3 Views Right  Result Date: 06/13/2016 CLINICAL DATA:  Right leg weakness and pain, no known injury, initial encounter EXAM:  DG HIP (WITH OR WITHOUT PELVIS) 3VRIGHT COMPARISON:  None. FINDINGS: Pelvic ring is intact. No acute fracture or dislocation is noted.  No gross soft tissue abnormality is seen. IMPRESSION: No acute abnormality noted. Electronically Signed   By: Inez Catalina M.D.   On: 06/13/2016 07:11   Ct Head Code Stroke W/o Cm  Result Date: 06/13/2016 CLINICAL DATA:  Code stroke. Right-sided weakness. Hypertension, diabetes, dialysis EXAM: CT HEAD WITHOUT CONTRAST TECHNIQUE: Contiguous axial images were obtained from the base of the skull through the vertex without intravenous contrast. COMPARISON:  CT head 06/13/2016 FINDINGS: Brain: Negative for acute infarct.  Negative for hemorrhage or mass Mild atrophy. Chronic microvascular ischemic change in the white matter. Chronic infarcts in the basal ganglia bilaterally including the left internal capsule anteriorly. Bilateral putamen infarcts. Vascular: No hyperdense vessel or unexpected calcification. Skull: Negative Sinuses/Orbits: Negative Other: None ASPECTS ([place] Stroke Program Early CT Score) - Ganglionic level infarction (caudate, lentiform nuclei, internal capsule, insula, M1-M3 cortex): 7 - Supraganglionic infarction (M4-M6 cortex): 3 Total score (0-10 with 10 being normal): 10 IMPRESSION: 1. No acute intracranial abnormality 2. ASPECTS is 10 3. Moderate chronic ischemic changes in the white matter and basal ganglia bilaterally. These results were called by telephone at the time of interpretation on 06/13/2016 at 12:36 pm to Dr. [name], who verbally acknowledged these results. Electronically Signed   By: Franchot Gallo M.D.   On: 06/13/2016 12:36    Microbiology: Recent Results (from the past 240 hour(s))  MRSA PCR Screening     Status: None   Collection Time: 07/06/16  2:28 AM  Result Value Ref Range Status   MRSA by PCR NEGATIVE NEGATIVE Final    Comment:        The GeneXpert MRSA Assay (FDA approved for NASAL specimens only), is one component of a comprehensive MRSA colonization surveillance program. It is not intended to diagnose MRSA infection nor to guide or monitor  treatment for MRSA infections.      Labs: Basic Metabolic Panel:  Recent Labs Lab 07/05/16 2256 07/07/16 1154  NA 141 137  K 3.4* 4.6  CL 102 102  CO2 25 25  GLUCOSE 262* 150*  BUN 28* 15  CREATININE 6.41* 5.20*  CALCIUM 7.7* 7.8*   Liver Function Tests:  Recent Labs Lab 07/05/16 2256  AST 30  ALT 16*  ALKPHOS 128*  BILITOT 0.3  PROT 7.1  ALBUMIN 3.4*   No results for input(s): LIPASE, AMYLASE in the last 168 hours. No results for input(s): AMMONIA in the last 168 hours. CBC:  Recent Labs Lab 07/05/16 2256 07/07/16 1154  WBC 10.2 6.0  NEUTROABS 4.5  --   HGB 10.6* 11.0*  HCT 33.7* 33.3*  MCV 92.1 88.8  PLT 352 250   Cardiac Enzymes:  Recent Labs Lab 07/05/16 2256 07/06/16 0459 07/06/16 1021  TROPONINI 0.03* 0.05* 0.06*   BNP: BNP (last 3 results) No results for input(s): BNP in the last 8760 hours.  ProBNP (last 3 results) No results for input(s): PROBNP in the last 8760 hours.  CBG:  Recent Labs Lab 07/06/16 1150 07/06/16 1617 07/06/16 2231 07/07/16 0719 07/07/16 1110  GLUCAP 166* 132* 127* 117* 224*       SignedDomenic Polite MD.  [facility] 07/07/2016, 2:03 PM

## 2016-07-08 ENCOUNTER — Inpatient Hospital Stay: Payer: Medicare Other | Admitting: Medical

## 2016-07-08 ENCOUNTER — Telehealth: Payer: Self-pay | Admitting: Behavioral Health

## 2016-07-08 ENCOUNTER — Telehealth: Payer: Self-pay | Admitting: Family

## 2016-07-08 NOTE — Telephone Encounter (Signed)
Altha Harm - [facility] [phone]    She says that pt was just released from the hospital. She would like to continue working with pt for PT so she need verbal orders    Frequency: twice a week for 2 weeks and once a week for 2 weeks.

## 2016-07-08 NOTE — Telephone Encounter (Signed)
Transition Care Management Follow-up Telephone Call  PCP: [name], NP  Admit date: 07/05/2016 Discharge date: 07/07/2016   Recommendations for Outpatient Follow-up:  1. PCP in 1 week, Dry weight lowered to 60Kg   Discharge Diagnoses:  Principal Problem:   Hypertensive emergency   Pulmonary edema   Type 2 diabetes, uncontrolled, with neuropathy ([name])   Essential hypertension   ESRD (end stage renal disease) ([place])   Flash pulmonary edema ([name])   SOB (shortness of breath)   Hypertensive urgency   Discharge Condition: stable   How have you been since you were released from the hospital? Patient stated, "I'm doing fine".   Do you understand why you were in the hospital? yes   Do you understand the discharge instructions? yes   Where were you discharged to? Home   Items Reviewed:  Medications reviewed: yes  Allergies reviewed: yes, NKA  Dietary changes reviewed: yes, patient voiced low sodium diet  Referrals reviewed: yes, PCP in 1 week, Dry weight lowered to 60Kg   Functional Questionnaire:   Activities of Daily Living (ADLs):   He states they are independent in the following: ambulation, bathing and hygiene, feeding, continence, grooming, toileting and dressing States they require assistance with the following: None   Any transportation issues/concerns?: no   Any patient concerns? no   Confirmed importance and date/time of follow-up visits scheduled yes, 07/09/16 at 1:30 PM.  Provider Appointment booked with Mackie Pai, PA-C.  Confirmed with patient if condition begins to worsen call PCP or go to the ER.  Patient was given the office number and encouraged to call back with question or concerns.  : yes

## 2016-07-08 NOTE — Telephone Encounter (Signed)
OK. Thanks.

## 2016-07-08 NOTE — Telephone Encounter (Signed)
Verbal authorization given to [facility] for below PT. Please advise if any further instructions?

## 2016-07-09 ENCOUNTER — Encounter: Payer: Self-pay | Admitting: Medical

## 2016-07-09 ENCOUNTER — Ambulatory Visit (HOSPITAL_BASED_OUTPATIENT_CLINIC_OR_DEPARTMENT_OTHER)
Admission: RE | Admit: 2016-07-09 | Discharge: 2016-07-09 | Disposition: A | Discharge status: Home / Self Care | Payer: Medicare Other | Source: Ambulatory Visit | Attending: Medical | Admitting: Medical

## 2016-07-09 ENCOUNTER — Ambulatory Visit (INDEPENDENT_AMBULATORY_CARE_PROVIDER_SITE_OTHER): Payer: Medicare Other | Admitting: Medical

## 2016-07-09 ENCOUNTER — Telehealth: Payer: Self-pay | Admitting: Medical

## 2016-07-09 ENCOUNTER — Encounter: Payer: Self-pay | Admitting: Family

## 2016-07-09 VITALS — BP 137/81 | HR 86 | Temp 98.1°F | Resp 16 | Ht 67.0 in | Wt 137.0 lb

## 2016-07-09 DIAGNOSIS — N186 End stage renal disease: Secondary | ICD-10-CM | POA: Diagnosis not present

## 2016-07-09 DIAGNOSIS — I10 Essential (primary) hypertension: Secondary | ICD-10-CM

## 2016-07-09 DIAGNOSIS — J984 Other disorders of lung: Secondary | ICD-10-CM | POA: Insufficient documentation

## 2016-07-09 DIAGNOSIS — R0600 Dyspnea, unspecified: Secondary | ICD-10-CM | POA: Diagnosis not present

## 2016-07-09 DIAGNOSIS — I5022 Chronic systolic (congestive) heart failure: Secondary | ICD-10-CM | POA: Insufficient documentation

## 2016-07-09 DIAGNOSIS — R918 Other nonspecific abnormal finding of lung field: Secondary | ICD-10-CM | POA: Diagnosis not present

## 2016-07-09 DIAGNOSIS — D649 Anemia, unspecified: Secondary | ICD-10-CM

## 2016-07-09 LAB — BRAIN NATRIURETIC PEPTIDE: PRO B NATRI PEPTIDE: 1044 pg/mL — AB (ref 0.0–100.0)

## 2016-07-09 NOTE — Telephone Encounter (Signed)
Will you help pt reschedule with cardiologist.

## 2016-07-09 NOTE — Progress Notes (Signed)
Pre visit review using our clinic review tool, if applicable. No additional management support is needed unless otherwise documented below in the visit note. 

## 2016-07-09 NOTE — Progress Notes (Signed)
Subjective:    Patient ID: [name], male    DOB: [birth year], 71 y.o.   MRN: [id number]  HPI  Pt in for follow up from the hospital. He was discharge 2 days ago. He states he can lay flat on his back and sleep with no dyspnea. Pt states able to walk with no shortness of breath. Pt feels like he legs are not swelling. Denies any chest pain or sob.  Pt has appointment with cardiologist. He thinks they will evaluate him possibly for chf.   Pt echo in hospital showed ef 45-50%  Known ESRD. Pt has dialysis this Friday.  Labs stable on DC  Weight not increased.  In hospital it was determined no ACS.  Pt before admitted had [facility] emergency.some lvh on ekg. Also pulmonary edema.  Review of Systems  Constitutional: Positive for chills. Negative for fatigue and fever.  HENT: Negative for congestion, sinus pain and sinus pressure.   Respiratory: Negative for cough, chest tightness, shortness of breath and wheezing.   Cardiovascular: Negative for chest pain and palpitations.  Gastrointestinal: Negative for abdominal pain, blood in stool, diarrhea and nausea.  Genitourinary: Negative for dysuria.  Musculoskeletal: Negative for back pain, gait problem, neck pain and neck stiffness.  Skin: Negative for rash.  Neurological: Negative for dizziness, weakness and light-headedness.  Hematological: Negative for adenopathy. Does not bruise/bleed easily.  Psychiatric/Behavioral: Negative for behavioral problems and confusion. The patient is not nervous/anxious.    Past Medical History:  Diagnosis Date  . 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 Anemia, iron deficiency 03/24/2011   a. Recurrent GI bleed, tx with periodic iron infusions.  . 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 AVM (arteriovenous malformation) of duodenum, acquired    egds in 01/2012, 04/2011  . BPH (benign prostatic hyperplasia)   . CAD (coronary artery disease)    a. Cath 09/2012: moderate borderline CAD in mid LAD/small  diagonal branch, mild RCA stenosis, to be managed medically   . Chronic lower back pain   . Diabetic peripheral neuropathy ([place]) 2014   foot pain.  . Fatty liver    on CT of 11/2010  . GERD (gastroesophageal reflux disease)   . GI bleed    a. Recurrent GI bleed, tx with IV iron. b. Per heme notes - likely AVMs 04/2012 (tx with cauterization several months ago).  . Hematuria    a. Urology note scan from 07/2012: cystoscopy without evidence for bladder lesion, only lateral hypertrophy of posterior urethra, bladder impression from BPH. b. Pt states he had "some tests" scheduled for later in July 2014.  . High cholesterol   . Hypertension   . 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 Orthostatic hypotension    a. Tx with florinef.  . Peripheral neuropathy   . Polyp, colonic    Colonoscopy 01/2012 "benign" polyp  . Prostate cancer ([place])   . Sleep apnea    "had mask; couldn't sleep in it" (09/20/2012)  . Smoker   . Stage III chronic kidney disease    a. Stage 3 (DM with complications ->CKD, peripheral neuropathy).  . Stroke ([place])    x 2  . Type II diabetes mellitus ([place])    a. Dx 1994, uncontrolled.      Social History   Social History  . Marital status: Married    Spouse name: N/A  . Number of children: 2  . Years of education: N/A   Occupational History  . taken out of work  due to back    Social History Main Topics  . Smoking status: Current Some Day Smoker    Packs/day: 1.00    Years: 43.00    Types: Cigarettes    Start date: 04/15/1968  . Smokeless tobacco: Never Used     Comment: 07-22--2016  still smoking  . Alcohol use No     Comment: 09/20/2012 "Used to; stopped ~ 2009; never had problem w/it"  . Drug use: No  . Sexual activity: No   Other Topics Concern  . Not on file   Social History Narrative   Regular exercise: rides horses   Caffeine use: occasionally    Past Surgical History:  Procedure Laterality Date  . ENTEROSCOPY N/A 03/01/2013    Procedure: ENTEROSCOPY;  Surgeon: Inda Castle, MD;  Location: [facility];  Service: Endoscopy;  Laterality: N/A;  . ENTEROSCOPY N/A 06/26/2016   Procedure: ENTEROSCOPY;  Surgeon: Milus Banister, MD;  Location: [facility];  Service: Endoscopy;  Laterality: N/A;  this is a push enteroscopy  . GIVENS CAPSULE STUDY N/A 04/05/2015   Procedure: GIVENS CAPSULE STUDY;  Surgeon: Gatha Mayer, MD;  Location: [facility];  Service: Endoscopy;  Laterality: N/A;  . INGUINAL HERNIA REPAIR Right 2011  . LEFT HEART CATHETERIZATION WITH CORONARY ANGIOGRAM N/A 09/21/2012   Procedure: LEFT HEART CATHETERIZATION WITH CORONARY ANGIOGRAM;  Surgeon: Peter M Martinique, MD;  Location: [facility] C[facility];  Service: Cardiovascular;  Laterality: N/A;  . LUMBAR DISC SURGERY  1980's  . LYMPHADENECTOMY Bilateral 01/19/2013   Procedure: LYMPHADENECTOMY;  Surgeon: Bernestine Amass, MD;  Location: [facility];  Service: Urology;  Laterality: Bilateral;  . ROBOT ASSISTED LAPAROSCOPIC RADICAL PROSTATECTOMY N/A 01/19/2013   Procedure: ROBOTIC ASSISTED LAPAROSCOPIC RADICAL PROSTATECTOMY;  Surgeon: Bernestine Amass, MD;  Location: [facility];  Service: Urology;  Laterality: N/A;  . SHOULDER OPEN ROTATOR CUFF REPAIR Left 1980's    Family History  Problem Relation Age of Onset  . Stroke Father     Died at 58  . Diabetes Mother   . Heart attack Brother     Died at 8  . Diabetes Sister   . Stomach cancer Brother   . Heart attack Sister     No Known Allergies  Current Outpatient Prescriptions on File Prior to Visit  Medication Sig Dispense Refill  . benzonatate (TESSALON) 100 MG capsule Take 1 capsule (100 mg total) by mouth 3 (three) times daily as needed for cough. 21 capsule 0  . Carboxymethylcellul-Glycerin (CLEAR EYES FOR DRY EYES) 1-0.25 % SOLN Place 1 drop into both eyes daily as needed (dry eyes).    . carvedilol (COREG) 3.125 MG tablet Take 1 tablet (3.125 mg total) by mouth 2 (two) times daily with a meal. 60 tablet 0  .  clopidogrel (PLAVIX) 75 MG tablet Take 1 tablet (75 mg total) by mouth daily. 30 tablet 0  . gabapentin (NEURONTIN) 300 MG capsule Take 1 capsule (300 mg total) by mouth at bedtime. 30 capsule 0  . omeprazole (PRILOSEC) 40 MG capsule Take 1 capsule (40 mg total) by mouth daily. 30 capsule 3  . rosuvastatin (CRESTOR) 40 MG tablet Take 1 tablet (40 mg total) by mouth daily. 30 tablet 0   No current facility-administered medications on file prior to visit.     BP 137/81 (BP Location: Right Arm, Patient Position: Sitting, Cuff Size: Normal)   Pulse 86   Temp 98.1 F (36.7 C) (Oral)   Resp 16   Ht 5\' 7"  (  1.702 m)   Wt 137 lb (62.1 kg)   SpO2 98%   BMI 21.46 kg/m       Objective:   Physical Exam  General Mental Status- Alert. General Appearance- Not in acute distress.   Skin General: Color- Normal Color. Moisture- Normal Moisture.  Neck Carotid Arteries- Normal color. Moisture- Normal Moisture. No carotid bruits. No JVD.  Chest and Lung Exam Auscultation: Breath Sounds:-Normal.  Cardiovascular Auscultation:Rythm- Regular. Murmurs & Other Heart Sounds:Auscultation of the heart reveals- No Murmurs.  Abdomen Inspection:-Inspeection Normal. Palpation/Percussion:Note:No mass. Palpation and Percussion of the abdomen reveal- Non Tender, Non Distended + BS, no rebound or guarding.    Neurologic Cranial Nerve exam:- CN III-XII intact(No nystagmus), symmetric smile. Strength:- 5/5 equal and symmetric strength both upper and lower extremities.  Lower ext- no pedal edema. Negative homans signs.      Assessment & Plan:  Your dyspnea has resolved since hospitalization. Your vitals are stable and weight has not increased. Labs on discharge look stable as well though known kidney disease.  I want you to make your cardiologist appointment today but pt stated he will reschedule.(I will send message to our staff to ask for help in rescheduling)  Your blood pressure is much better  compared to reading in the ED.  Continue your current medication regimen.  Will get cxr today and bnp today.  Follow up as regularly with pcp or as needed

## 2016-07-09 NOTE — Patient Instructions (Addendum)
Your dyspnea has resolved since hospitalization. Your vitals are stable and weight has not increased. Labs on discharge look stable as well though known kidney disease.  I want you to make your cardiologist appointment today but pt stated he will reschedule.(I will send message to our staff to ask for help in rescheduling)  Your blood pressure is much better compared to reading in the ED.  Continue your current medication regimen.  Will get cxr today and bnp today.  Follow up as regularly with pcp or as needed

## 2016-07-09 NOTE — Telephone Encounter (Signed)
I ordered cbc yesterday but forgot to sign order until later. Can you run cbc.

## 2016-07-09 NOTE — Telephone Encounter (Signed)
Will you get pt in with cardiologist. I put in referral. Can he be seen by early next week at latest. See referral. Can you refer to cardiologist that he was supposed to see yesterday but missed appointment.

## 2016-07-10 ENCOUNTER — Telehealth: Payer: Self-pay | Admitting: Family

## 2016-07-10 NOTE — Telephone Encounter (Signed)
Caller name: Judeen Hammans RN with [facility] Can be reached: [phone]  Reason for call: completed SN visit with pt 07/08/16 and filled pill boxes. Wanting to report noncompliance after follow up visit today. Pt has only taken meds Sun AM and Wed AM meds this week. Pt stated he was taking some out of the bottles but they are not sure which meds and what time frame. Pt was aggravated by questions.   Priolosec pulls as allergy 2 med allergy profile and she needed to alert [place] of that.

## 2016-07-10 NOTE — Telephone Encounter (Signed)
CBC can not be added to the blood drawn, we would need a different tube. KMP

## 2016-07-10 NOTE — Telephone Encounter (Signed)
Referral sent to [facility], awaiting appt

## 2016-07-10 NOTE — Telephone Encounter (Signed)
That's ok  Thanks

## 2016-07-10 NOTE — Telephone Encounter (Signed)
Noted  

## 2016-07-13 ENCOUNTER — Telehealth: Payer: Self-pay | Admitting: Medical

## 2016-07-13 NOTE — Telephone Encounter (Signed)
I have seen pt recently for hospital follow up. Why is he on my schedule for complete physical? Should this be scheduled with pcp?? Just curious since seems like I have been seeing a lot which I am accustomed to see walk ins for others. But not to many physicals. Is this a physical or routine check up??

## 2016-07-14 NOTE — Telephone Encounter (Signed)
OK with me.

## 2016-07-14 NOTE — Telephone Encounter (Signed)
Called to follow up with patient. Pt states he scheduled physical with [name] because he would like to switch his care from [place] to [facility].    Please advise.

## 2016-07-15 ENCOUNTER — Telehealth: Payer: Self-pay | Admitting: Medical

## 2016-07-15 ENCOUNTER — Ambulatory Visit: Payer: Medicare Other | Admitting: Family

## 2016-07-15 NOTE — Telephone Encounter (Signed)
Ok. Will see him when he comes in. Will advise staff to make switch pcp to me when he comes in next exam.

## 2016-07-16 ENCOUNTER — Telehealth: Payer: Self-pay | Admitting: *Deleted

## 2016-07-16 NOTE — Telephone Encounter (Signed)
Noted  

## 2016-07-16 NOTE — Telephone Encounter (Signed)
Received OT Therapy Orders, forwarded to provider/SLS 05/01

## 2016-07-16 NOTE — Telephone Encounter (Signed)
Mackie Pai, PA-C 15 hours ago (4:50 PM)     [place]. Will see him when he comes in. Will advise staff to make switch pcp to me when he comes in next exam.

## 2016-07-16 NOTE — Telephone Encounter (Signed)
Received Home Health Certification and Plan of Care; forwarded to provider/SLS 05/01 

## 2016-07-17 ENCOUNTER — Encounter: Payer: Self-pay | Admitting: Medical

## 2016-07-21 ENCOUNTER — Encounter: Payer: Self-pay | Admitting: *Deleted

## 2016-07-25 ENCOUNTER — Emergency Department (HOSPITAL_BASED_OUTPATIENT_CLINIC_OR_DEPARTMENT_OTHER): Payer: Medicare Other

## 2016-07-25 ENCOUNTER — Encounter (HOSPITAL_BASED_OUTPATIENT_CLINIC_OR_DEPARTMENT_OTHER): Payer: Self-pay | Admitting: *Deleted

## 2016-07-25 ENCOUNTER — Inpatient Hospital Stay (HOSPITAL_BASED_OUTPATIENT_CLINIC_OR_DEPARTMENT_OTHER)
Admission: EM | Admit: 2016-07-25 | Discharge: 2016-07-26 | DRG: 291 | Disposition: A | Discharge status: Home / Self Care | Payer: Medicare Other | Attending: Internal Medicine | Admitting: Internal Medicine

## 2016-07-25 DIAGNOSIS — E7800 Pure hypercholesterolemia, unspecified: Secondary | ICD-10-CM | POA: Diagnosis present

## 2016-07-25 DIAGNOSIS — G4730 Sleep apnea, unspecified: Secondary | ICD-10-CM | POA: Diagnosis present

## 2016-07-25 DIAGNOSIS — E1142 Type 2 diabetes mellitus with diabetic polyneuropathy: Secondary | ICD-10-CM | POA: Diagnosis present

## 2016-07-25 DIAGNOSIS — J811 Chronic pulmonary edema: Secondary | ICD-10-CM

## 2016-07-25 DIAGNOSIS — D631 Anemia in chronic kidney disease: Secondary | ICD-10-CM | POA: Diagnosis present

## 2016-07-25 DIAGNOSIS — J9601 Acute respiratory failure with hypoxia: Secondary | ICD-10-CM | POA: Diagnosis present

## 2016-07-25 DIAGNOSIS — Z8673 Personal history of transient ischemic attack (TIA), and cerebral infarction without residual deficits: Secondary | ICD-10-CM | POA: Diagnosis not present

## 2016-07-25 DIAGNOSIS — I161 Hypertensive emergency: Secondary | ICD-10-CM | POA: Diagnosis present

## 2016-07-25 DIAGNOSIS — J810 Acute pulmonary edema: Secondary | ICD-10-CM | POA: Diagnosis present

## 2016-07-25 DIAGNOSIS — I5033 Acute on chronic diastolic (congestive) heart failure: Secondary | ICD-10-CM | POA: Diagnosis present

## 2016-07-25 DIAGNOSIS — Z992 Dependence on renal dialysis: Secondary | ICD-10-CM

## 2016-07-25 DIAGNOSIS — K219 Gastro-esophageal reflux disease without esophagitis: Secondary | ICD-10-CM | POA: Diagnosis present

## 2016-07-25 DIAGNOSIS — N19 Unspecified kidney failure: Secondary | ICD-10-CM

## 2016-07-25 DIAGNOSIS — M545 Low back pain: Secondary | ICD-10-CM | POA: Diagnosis present

## 2016-07-25 DIAGNOSIS — Z7901 Long term (current) use of anticoagulants: Secondary | ICD-10-CM

## 2016-07-25 DIAGNOSIS — N186 End stage renal disease: Secondary | ICD-10-CM | POA: Diagnosis not present

## 2016-07-25 DIAGNOSIS — Z8546 Personal history of malignant neoplasm of prostate: Secondary | ICD-10-CM | POA: Diagnosis not present

## 2016-07-25 DIAGNOSIS — Z79899 Other long term (current) drug therapy: Secondary | ICD-10-CM

## 2016-07-25 DIAGNOSIS — I132 Hypertensive heart and chronic kidney disease with heart failure and with stage 5 chronic kidney disease, or end stage renal disease: Secondary | ICD-10-CM | POA: Diagnosis present

## 2016-07-25 DIAGNOSIS — E1122 Type 2 diabetes mellitus with diabetic chronic kidney disease: Secondary | ICD-10-CM | POA: Diagnosis present

## 2016-07-25 DIAGNOSIS — I2510 Atherosclerotic heart disease of native coronary artery without angina pectoris: Secondary | ICD-10-CM | POA: Diagnosis present

## 2016-07-25 DIAGNOSIS — E785 Hyperlipidemia, unspecified: Secondary | ICD-10-CM | POA: Diagnosis present

## 2016-07-25 DIAGNOSIS — N400 Enlarged prostate without lower urinary tract symptoms: Secondary | ICD-10-CM | POA: Diagnosis present

## 2016-07-25 LAB — RENAL FUNCTION PANEL
Albumin: 2.9 g/dL — ABNORMAL LOW (ref 3.5–5.0)
Anion gap: 11 (ref 5–15)
BUN: 39 mg/dL — AB (ref 6–20)
CHLORIDE: 104 mmol/L (ref 101–111)
CO2: 25 mmol/L (ref 22–32)
CREATININE: 9.17 mg/dL — AB (ref 0.61–1.24)
Calcium: 8.3 mg/dL — ABNORMAL LOW (ref 8.9–10.3)
GFR calc non Af Amer: 5 mL/min — ABNORMAL LOW (ref >60)
GFR, EST AFRICAN AMERICAN: 6 mL/min — AB (ref >60)
Glucose, Bld: 126 mg/dL — ABNORMAL HIGH (ref 65–99)
POTASSIUM: 5.2 mmol/L — AB (ref 3.5–5.1)
Phosphorus: 5.2 mg/dL — ABNORMAL HIGH (ref 2.5–4.6)
Sodium: 140 mmol/L (ref 135–145)

## 2016-07-25 LAB — I-STAT CHEM 8, ED
BUN: 39 mg/dL — ABNORMAL HIGH (ref 6–20)
CREATININE: 9 mg/dL — AB (ref 0.61–1.24)
Calcium, Ion: 1.06 mmol/L — ABNORMAL LOW (ref 1.15–1.40)
Chloride: 107 mmol/L (ref 101–111)
GLUCOSE: 163 mg/dL — AB (ref 65–99)
HCT: 43 % (ref 39.0–52.0)
HEMOGLOBIN: 14.6 g/dL (ref 13.0–17.0)
POTASSIUM: 4.9 mmol/L (ref 3.5–5.1)
Sodium: 142 mmol/L (ref 135–145)
TCO2: 27 mmol/L (ref 0–100)

## 2016-07-25 LAB — CBC WITH DIFFERENTIAL/PLATELET
BASOS PCT: 1 %
Basophils Absolute: 0 10*3/uL (ref 0.0–0.1)
EOS ABS: 0.3 10*3/uL (ref 0.0–0.7)
EOS PCT: 4 %
HEMATOCRIT: 43.2 % (ref 39.0–52.0)
HEMOGLOBIN: 14.3 g/dL (ref 13.0–17.0)
LYMPHS ABS: 1.3 10*3/uL (ref 0.7–4.0)
Lymphocytes Relative: 16 %
MCH: 30.6 pg (ref 26.0–34.0)
MCHC: 33.1 g/dL (ref 30.0–36.0)
MCV: 92.3 fL (ref 78.0–100.0)
MONO ABS: 1 10*3/uL (ref 0.1–1.0)
Monocytes Relative: 12 %
NEUTROS ABS: 5.6 10*3/uL (ref 1.7–7.7)
NEUTROS PCT: 67 %
Platelets: 180 10*3/uL (ref 150–400)
RBC: 4.68 MIL/uL (ref 4.22–5.81)
RDW: 18.6 % — ABNORMAL HIGH (ref 11.5–15.5)
WBC: 8.3 10*3/uL (ref 4.0–10.5)

## 2016-07-25 LAB — GLUCOSE, CAPILLARY
GLUCOSE-CAPILLARY: 105 mg/dL — AB (ref 65–99)
Glucose-Capillary: 126 mg/dL — ABNORMAL HIGH (ref 65–99)
Glucose-Capillary: 79 mg/dL (ref 65–99)

## 2016-07-25 LAB — I-STAT ARTERIAL BLOOD GAS, ED
Bicarbonate: 25.6 mmol/L (ref 20.0–28.0)
O2 SAT: 100 %
PO2 ART: 292 mmHg — AB (ref 83.0–108.0)
TCO2: 27 mmol/L (ref 0–100)
pCO2 arterial: 45 mmHg (ref 32.0–48.0)
pH, Arterial: 7.362 (ref 7.350–7.450)

## 2016-07-25 LAB — BRAIN NATRIURETIC PEPTIDE: B Natriuretic Peptide: 1389.4 pg/mL — ABNORMAL HIGH (ref 0.0–100.0)

## 2016-07-25 LAB — TROPONIN I

## 2016-07-25 LAB — HEPATITIS B SURFACE ANTIGEN: Hepatitis B Surface Ag: NEGATIVE

## 2016-07-25 MED ORDER — GABAPENTIN 300 MG PO CAPS
300.0000 mg | ORAL_CAPSULE | Freq: Every day | ORAL | Status: DC
  Administered 2016-07-25: 300 mg via ORAL
  Filled 2016-07-25: qty 1

## 2016-07-25 MED ORDER — CHLORHEXIDINE GLUCONATE CLOTH 2 % EX PADS
6.0000 | MEDICATED_PAD | Freq: Every day | CUTANEOUS | Status: DC
  Administered 2016-07-25 – 2016-07-26 (×2): 6 via TOPICAL

## 2016-07-25 MED ORDER — INSULIN ASPART 100 UNIT/ML ~~LOC~~ SOLN
2.0000 [IU] | SUBCUTANEOUS | Status: DC
  Administered 2016-07-25: 2 [IU] via SUBCUTANEOUS

## 2016-07-25 MED ORDER — ROSUVASTATIN CALCIUM 40 MG PO TABS
40.0000 mg | ORAL_TABLET | Freq: Every day | ORAL | Status: DC
  Administered 2016-07-25: 40 mg via ORAL
  Filled 2016-07-25: qty 2
  Filled 2016-07-25 (×2): qty 1

## 2016-07-25 MED ORDER — CLOPIDOGREL BISULFATE 75 MG PO TABS
75.0000 mg | ORAL_TABLET | Freq: Once | ORAL | Status: AC
  Administered 2016-07-25: 75 mg
  Filled 2016-07-25 (×2): qty 1

## 2016-07-25 MED ORDER — HEPARIN SODIUM (PORCINE) 5000 UNIT/ML IJ SOLN
5000.0000 [IU] | Freq: Three times a day (TID) | INTRAMUSCULAR | Status: DC
  Administered 2016-07-25 – 2016-07-26 (×4): 5000 [IU] via SUBCUTANEOUS
  Filled 2016-07-25 (×6): qty 1

## 2016-07-25 MED ORDER — HYDRALAZINE HCL 20 MG/ML IJ SOLN: 10.0000 mg | INTRAMUSCULAR | Status: DC | PRN

## 2016-07-25 MED ORDER — SODIUM CHLORIDE 0.9 % IV SOLN
1.0000 g | Freq: Once | INTRAVENOUS | Status: AC
  Administered 2016-07-25: 1 g via INTRAVENOUS
  Filled 2016-07-25 (×2): qty 10

## 2016-07-25 MED ORDER — SODIUM CHLORIDE 0.9 % IV SOLN: 250.0000 mL | INTRAVENOUS | Status: DC | PRN

## 2016-07-25 MED ORDER — SODIUM CHLORIDE 0.9% FLUSH
10.0000 mL | Freq: Two times a day (BID) | INTRAVENOUS | Status: DC
  Administered 2016-07-26: 10 mL

## 2016-07-25 MED ORDER — ORAL CARE MOUTH RINSE
15.0000 mL | Freq: Two times a day (BID) | OROMUCOSAL | Status: DC
  Administered 2016-07-26: 15 mL via OROMUCOSAL

## 2016-07-25 MED ORDER — CARVEDILOL 3.125 MG PO TABS
3.1250 mg | ORAL_TABLET | Freq: Two times a day (BID) | ORAL | Status: DC
  Administered 2016-07-25: 3.125 mg via ORAL
  Filled 2016-07-25 (×4): qty 1

## 2016-07-25 MED ORDER — NITROGLYCERIN 2 % TD OINT
TOPICAL_OINTMENT | TRANSDERMAL | Status: AC
  Filled 2016-07-25: qty 1

## 2016-07-25 MED ORDER — SODIUM CHLORIDE 0.9% FLUSH: 10.0000 mL | INTRAVENOUS | Status: DC | PRN

## 2016-07-25 MED ORDER — PANTOPRAZOLE SODIUM 40 MG PO TBEC
40.0000 mg | DELAYED_RELEASE_TABLET | Freq: Every day | ORAL | Status: DC
  Administered 2016-07-25: 40 mg via ORAL
  Filled 2016-07-25: qty 1

## 2016-07-25 MED ORDER — CLOPIDOGREL BISULFATE 75 MG PO TABS
75.0000 mg | ORAL_TABLET | Freq: Every day | ORAL | Status: DC
  Administered 2016-07-26: 75 mg via ORAL
  Filled 2016-07-25: qty 1

## 2016-07-25 MED ORDER — NITROGLYCERIN 2 % TD OINT
1.0000 [in_us] | TOPICAL_OINTMENT | Freq: Once | TRANSDERMAL | Status: AC
  Administered 2016-07-25: 1 [in_us] via TOPICAL

## 2016-07-25 MED ORDER — FUROSEMIDE 10 MG/ML IJ SOLN
40.0000 mg | Freq: Once | INTRAMUSCULAR | Status: AC
  Administered 2016-07-25: 40 mg via INTRAVENOUS
  Filled 2016-07-25: qty 4

## 2016-07-25 MED ORDER — LABETALOL HCL 5 MG/ML IV SOLN
0.5000 mg/min | INTRAVENOUS | Status: DC
  Administered 2016-07-25: 0.5 mg/min via INTRAVENOUS
  Filled 2016-07-25: qty 100

## 2016-07-25 NOTE — H&P (Signed)
PULMONARY / CRITICAL CARE MEDICINE   Name: [name] MRN: [id number] DOB: 07/23/1945    ADMISSION DATE:  07/25/2016 CONSULTATION DATE:  07/25/2016  REFERRING MD:  Randal Buba, EDP  CHIEF COMPLAINT:  Dyspnea   HISTORY OF PRESENT ILLNESS:   71 year old male with PMH of Anemia, AVM, BPH, CAD, DM, GERD, GI bleed, Prostate Cancer, ESRD on HD MWF, and CVA. 5/11 patient presented to [facility] ED with sudden onset shortness of breath. Upon arrival BP 190/138 and patient was severely short of breath requiring BiPAP. BNP 1,389.4, ABG 7.36/45/292 > on BiPAP.  Was placed on labetalol gtt and transferred to [facility] for further management. Patient denies missing HD session or BP medications. CXR revealed interstitial and airspace infiltrates consistent with pulmonary edema.   Admitted 4/22-4/23 with pulmonary edema requiring additional HD session.   PAST MEDICAL HISTORY :  He  has a past medical history of Anemia of renal disease (05/14/2011); Anemia, iron deficiency (03/24/2011); Angiodysplasia of intestinal tract (04/06/2015); AVM (arteriovenous malformation) of duodenum, acquired; BPH (benign prostatic hyperplasia); CAD (coronary artery disease); Chronic lower back pain; Diabetic peripheral neuropathy ([place]) (2014); Fatty liver; GERD (gastroesophageal reflux disease); GI bleed; Hematuria; High cholesterol; Hypertension; Intestinal angiodysplasia with bleeding (03/01/2013); Orthostatic hypotension; Peripheral neuropathy; Polyp, colonic; Prostate cancer ([place]); Sleep apnea; Smoker; Stage III chronic kidney disease; Stroke ([place]); and Type II diabetes mellitus ([place]).  PAST SURGICAL HISTORY: He  has a past surgical history that includes Shoulder open rotator cuff repair (Left, 1980's); Inguinal hernia repair (Right, 2011); Lumbar disc surgery (1980's); Robot assisted laparoscopic radical prostatectomy (N/A, 01/19/2013); Lymphadenectomy (Bilateral, 01/19/2013); enteroscopy (N/A, 03/01/2013); left heart catheterization  with coronary angiogram (N/A, 09/21/2012); Givens capsule study (N/A, 04/05/2015); and enteroscopy (N/A, 06/26/2016).  No Known Allergies  No current facility-administered medications on file prior to encounter.    Current Outpatient Prescriptions on File Prior to Encounter  Medication Sig  . benzonatate (TESSALON) 100 MG capsule Take 1 capsule (100 mg total) by mouth 3 (three) times daily as needed for cough.  . Carboxymethylcellul-Glycerin (CLEAR EYES FOR DRY EYES) 1-0.25 % SOLN Place 1 drop into both eyes daily as needed (dry eyes).  . carvedilol (COREG) 3.125 MG tablet Take 1 tablet (3.125 mg total) by mouth 2 (two) times daily with a meal.  . clopidogrel (PLAVIX) 75 MG tablet Take 1 tablet (75 mg total) by mouth daily.  [name] Kitchen gabapentin (NEURONTIN) 300 MG capsule Take 1 capsule (300 mg total) by mouth at bedtime.  [name] Kitchen omeprazole (PRILOSEC) 40 MG capsule Take 1 capsule (40 mg total) by mouth daily.  . rosuvastatin (CRESTOR) 40 MG tablet Take 1 tablet (40 mg total) by mouth daily.    FAMILY HISTORY:  His indicated that his mother is deceased. He indicated that his father is deceased. He indicated that his maternal grandmother is deceased. He indicated that his maternal grandfather is deceased. He indicated that his paternal grandmother is deceased. He indicated that his paternal grandfather is deceased.    SOCIAL HISTORY: He  reports that he has been smoking Cigarettes.  He started smoking about 48 years ago. He has a 43.00 pack-year smoking history. He has never used smokeless tobacco. He reports that he does not drink alcohol or use drugs.  REVIEW OF SYSTEMS:   All negative; except for those that are bolded, which indicate positives.  Constitutional: weight loss, weight gain, night sweats, fevers, chills, fatigue, weakness.  HEENT: headaches, sore throat, sneezing, nasal congestion, post nasal drip, difficulty swallowing, tooth/dental problems,  visual complaints, visual changes, ear  aches. Neuro: difficulty with speech, weakness, numbness, ataxia. CV:  chest pain, orthopnea, PND, swelling in lower extremities, dizziness, palpitations, syncope.  Resp: cough, hemoptysis, dyspnea, wheezing. GI: heartburn, indigestion, abdominal pain, nausea, vomiting, diarrhea, constipation, change in bowel habits, loss of appetite, hematemesis, melena, hematochezia.  GU: dysuria, change in color of urine, urgency or frequency, flank pain, hematuria. MSK: joint pain or swelling, decreased range of motion. Psych: change in mood or affect, depression, anxiety, suicidal ideations, homicidal ideations. Skin: rash, itching, bruising.   SUBJECTIVE:  Presents to ICU from [place]. On Labetalol gtt and BiPAP.  VITAL SIGNS: BP (!) 149/99   Pulse 94   Temp 98.8 F (37.1 C) (Axillary)   Resp (!) 27   Ht 5\' 7"  (1.702 m)   Wt 62.1 kg (137 lb)   SpO2 99%   BMI 21.46 kg/m   HEMODYNAMICS:    VENTILATOR SETTINGS: FiO2 (%):  [100 %] 100 %  INTAKE / OUTPUT: No intake/output data recorded.  PHYSICAL EXAMINATION: General:  Adult male, in NAD. Neuro:  A&O x 3, no deficits. HEENT:  [place]/AT. MMM. Cardiovascular:  RRR, no M/R/G. Lungs: Bibasilar crackles, no wheeze, normal respiratory effort. Abdomen:  BS x 4, S/NT/ND. Musculoskeletal:  AVF to left forearm, permcath to right upper chest  Skin:  Warm, dry.   LABS:  BMET  Recent Labs Lab 07/25/16 0530  NA 142  K 4.9  CL 107  BUN 39*  CREATININE 9.00*  GLUCOSE 163*    Electrolytes No results for input(s): CALCIUM, MG, PHOS in the last 168 hours.  CBC  Recent Labs Lab 07/25/16 0430 07/25/16 0530  WBC 8.3  --   HGB 14.3 14.6  HCT 43.2 43.0  PLT 180  --     Coag's No results for input(s): APTT, INR in the last 168 hours.  Sepsis Markers No results for input(s): LATICACIDVEN, PROCALCITON, O2SATVEN in the last 168 hours.  ABG  Recent Labs Lab 07/25/16 0528  PHART 7.362  PCO2ART 45.0  PO2ART 292.0*    Liver  Enzymes No results for input(s): AST, ALT, ALKPHOS, BILITOT, ALBUMIN in the last 168 hours.  Cardiac Enzymes  Recent Labs Lab 07/25/16 0430  TROPONINI <0.03    Glucose No results for input(s): GLUCAP in the last 168 hours.  Imaging Dg Chest Portable 1 View  Result Date: 07/25/2016 CLINICAL DATA:  Acute severe shortness of breath for 1 hour. End-stage renal disease with dialysis scheduled for today. EXAM: PORTABLE CHEST 1 VIEW COMPARISON:  07/09/2016 FINDINGS: Dialysis catheter with lead tips over the lower SVC and cavoatrial junction. No pneumothorax. Heart size is normal. Diffuse interstitial and perihilar alveolar infiltrates consistent with edema. No pleural effusions. No pneumothorax. Mediastinal contours appear intact. Degenerative changes in the shoulders. Postoperative change on the left shoulder. IMPRESSION: Interstitial and airspace infiltrates in the lungs consistent with pulmonary edema, developing since previous study. Electronically Signed   By: Lucienne Capers M.D.   On: 07/25/2016 05:15     STUDIES:  CXR 5/11 > Interstitial and airspace infiltrates in the lungs consistent with pulmonary edema, developing since previous study.  CULTURES: None.   ANTIBIOTICS: None.   SIGNIFICANT EVENTS: 5/11 > Presents to ED   LINES/TUBES: Right IJ Permcath >> Left AVF >>  DISCUSSION: 71 year old with PMH of ESRD and Diastolic HF presents to [facility] with Severe Dyspnea and BP 190/138. Placed on labetalol gtt and transferred to [facility] for HD session.  ASSESSMENT / PLAN:  PULMONARY A: Acute Hypoxic Respiratory Failure in setting of flash pulmonary edema  P:   Maintain Oxygen >92 Pulmonology Hygiene > IS, Flutter, Mobilize (When able) BIPAP PRN   CARDIOVASCULAR A:  HTN urgency  Acute on Chronic Diastolic CHF H/O CAD, Hyperlipidemia  P:  Cardiac Monitoring Wean Labetalol gtt for systolic goal <924  PRN Hydralazine  Continue Plavix, Crestor, and  Coreg   RENAL A:   ESRD on HD MWF (Dry Weight 60 kg) P:   Nephrology consulted  Trend BMP Replace electrolytes as needed   GASTROINTESTINAL A:   Dysphagia  Malnutrition  P:   NPO > will be able to advance after HD session once dyspnea under control  PPI   HEMATOLOGIC A:   Anemia of Chronic Disease  P:  Trend CBC Maintain Hbg >7   INFECTIOUS A:   No issues  P:   Trend Fever and WBC curve   ENDOCRINE A:   2 DM    P:   Trend Glucose   NEUROLOGIC A:   Diabetic Neuropathy  Chronic Back Pain  H/O CVA  P:   RASS goal: 0 Monitor  Continue home Neurontin   FAMILY  - Updates: no family at bedside   - Inter-disciplinary family meet or Palliative Care meeting due by: 08/01/2016   CC Time: 46 minutes   [name] Pedro, AGACNP-BC [place] [facility]  Pgr: [phone]  PCCM Pgr: [phone]  Attending Note:  71 year old male with ESRD and CAD who [facility] [place] [facility] with flash pulmonary edema on BiPAP and labetalol.  On exam, diffuse crackles noted.  I reviewed CXR myself, pulmonary edema noted.  Improving with control of BP.  Will attempt to get off BiPAP and labetalol drip now that he is taking PO and transfer to SDU and to [facility] service later this afternoon if continues to improve.  In the meantime, hold abx, continue anti-HTN as ordered, ambulate, renal consult called, HD today for volume negative.    The patient is critically ill with multiple organ systems failure and requires high complexity decision making for assessment and support, frequent evaluation and titration of therapies, application of advanced monitoring technologies and extensive interpretation of multiple databases.   Critical Care Time devoted to patient care services described in this note is  45  Minutes. This time reflects time of care of this signee Dr Jennet Maduro. This critical care time does not reflect procedure time, or teaching time or supervisory time  of PA/NP/Med student/Med Resident etc but could involve care discussion time.  Rush Farmer, M.D. [facility] Pulmonary/Critical Care Medicine. Pager: [phone]. After hours pager: [phone].

## 2016-07-25 NOTE — ED Notes (Signed)
ntg paste removed.  

## 2016-07-25 NOTE — Procedures (Signed)
Pt seen on HD.  Ap 210 Vp 160 BFR 350.  SBP 90's after 2.6L removed.  He feels fine.  May need HD tomorrow as well though O2 sats 97% on no O2.   Will see what CXR looks like tomorrow

## 2016-07-25 NOTE — ED Triage Notes (Signed)
c/o sob onset 10 minutes pta  Denies pain  Due dialysis 0600 this am

## 2016-07-25 NOTE — Progress Notes (Signed)
Pt returned for the HD. VS stable. No change from the baseline.

## 2016-07-25 NOTE — Consult Note (Signed)
[name] is an 71 y.o. male referred by Dr Ashok Cordia   Chief Complaint: ESRD, Anemia, Sec HPTH HPI: 71yo BM with ESRD on HD at [facility] M[facility] and admitted for pulmonary edema.  Last HD was Wed and he achieved the targeted DW.  He says he has been losing wt.  EF in April was 45-50%.  Has and AVF that he says has been used the last 3 times (though only with 1 needle per nurse at his Dx unit).  Past Medical History:  Diagnosis Date  . 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 Anemia, iron deficiency 03/24/2011   a. Recurrent GI bleed, tx with periodic iron infusions.  . 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 AVM (arteriovenous malformation) of duodenum, acquired    egds in 01/2012, 04/2011  . BPH (benign prostatic hyperplasia)   . CAD (coronary artery disease)    a. Cath 09/2012: moderate borderline CAD in mid LAD/small diagonal branch, mild RCA stenosis, to be managed medically   . Chronic lower back pain   . Diabetic peripheral neuropathy ([place]) 2014   foot pain.  . Fatty liver    on CT of 11/2010  . GERD (gastroesophageal reflux disease)   . GI bleed    a. Recurrent GI bleed, tx with IV iron. b. Per heme notes - likely AVMs 04/2012 (tx with cauterization several months ago).  . Hematuria    a. Urology note scan from 07/2012: cystoscopy without evidence for bladder lesion, only lateral hypertrophy of posterior urethra, bladder impression from BPH. b. Pt states he had "some tests" scheduled for later in July 2014.  . High cholesterol   . Hypertension   . 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 Orthostatic hypotension    a. Tx with florinef.  . Peripheral neuropathy   . Polyp, colonic    Colonoscopy 01/2012 "benign" polyp  . Prostate cancer ([place])   . Sleep apnea    "had mask; couldn't sleep in it" (09/20/2012)  . Smoker   . Stage III chronic kidney disease    a. Stage 3 (DM with complications ->CKD, peripheral neuropathy).  . Stroke ([place])    x 2  . Type II diabetes mellitus  ([place])    a. Dx 1994, uncontrolled.     Past Surgical History:  Procedure Laterality Date  . ENTEROSCOPY N/A 03/01/2013   Procedure: ENTEROSCOPY;  Surgeon: Inda Castle, MD;  Location: [place];  Service: Endoscopy;  Laterality: N/A;  . ENTEROSCOPY N/A 06/26/2016   Procedure: ENTEROSCOPY;  Surgeon: Milus Banister, MD;  Location: [place];  Service: Endoscopy;  Laterality: N/A;  this is a push enteroscopy  . GIVENS CAPSULE STUDY N/A 04/05/2015   Procedure: GIVENS CAPSULE STUDY;  Surgeon: Gatha Mayer, MD;  Location: [place];  Service: Endoscopy;  Laterality: N/A;  . INGUINAL HERNIA REPAIR Right 2011  . LEFT HEART CATHETERIZATION WITH CORONARY ANGIOGRAM N/A 09/21/2012   Procedure: LEFT HEART CATHETERIZATION WITH CORONARY ANGIOGRAM;  Surgeon: Peter M Martinique, MD;  Location: [facility] CATH LAB;  Service: Cardiovascular;  Laterality: N/A;  . LUMBAR DISC SURGERY  1980's  . LYMPHADENECTOMY Bilateral 01/19/2013   Procedure: LYMPHADENECTOMY;  Surgeon: Bernestine Amass, MD;  Location: [facility];  Service: Urology;  Laterality: Bilateral;  . ROBOT ASSISTED LAPAROSCOPIC RADICAL PROSTATECTOMY N/A 01/19/2013   Procedure: ROBOTIC ASSISTED LAPAROSCOPIC RADICAL PROSTATECTOMY;  Surgeon: Bernestine Amass, MD;  [facility];  Service: Urology;  Laterality: N/A;  . SHOULDER OPEN ROTATOR CUFF REPAIR  Left 1980's    Family History  Problem Relation Age of Onset  . Stroke Father        Died at 38  . Diabetes Mother   . Heart attack Brother        Died at 24  . Diabetes Sister   . Stomach cancer Brother   . Heart attack Sister    Social History:  reports that he has been smoking Cigarettes.  He started smoking about 48 years ago. He has a 43.00 pack-year smoking history. He has never used smokeless tobacco. He reports that he does not drink alcohol or use drugs.  Allergies: No Known Allergies  Medications Prior to Admission  Medication Sig Dispense Refill  . benzonatate (TESSALON) 100 MG capsule Take  1 capsule (100 mg total) by mouth 3 (three) times daily as needed for cough. 21 capsule 0  . Carboxymethylcellul-Glycerin (CLEAR EYES FOR DRY EYES) 1-0.25 % SOLN Place 1 drop into both eyes daily as needed (dry eyes).    . carvedilol (COREG) 3.125 MG tablet Take 1 tablet (3.125 mg total) by mouth 2 (two) times daily with a meal. 60 tablet 0  . clopidogrel (PLAVIX) 75 MG tablet Take 1 tablet (75 mg total) by mouth daily. 30 tablet 0  . gabapentin (NEURONTIN) 300 MG capsule Take 1 capsule (300 mg total) by mouth at bedtime. 30 capsule 0  . omeprazole (PRILOSEC) 40 MG capsule Take 1 capsule (40 mg total) by mouth daily. 30 capsule 3  . rosuvastatin (CRESTOR) 40 MG tablet Take 1 tablet (40 mg total) by mouth daily. 30 tablet 0     Lab Results: UA: ND  Recent Labs  07/25/16 0430 07/25/16 0530  WBC 8.3  --   HGB 14.3 14.6  HCT 43.2 43.0  PLT 180  --    BMET  Recent Labs  07/25/16 0530  NA 142  K 4.9  CL 107  GLUCOSE 163*  BUN 39*  CREATININE 9.00*   LFT No results for input(s): PROT, ALBUMIN, AST, ALT, ALKPHOS, BILITOT, BILIDIR, IBILI in the last 72 hours. Dg Chest Portable 1 View  Result Date: 07/25/2016 CLINICAL DATA:  Acute severe shortness of breath for 1 hour. End-stage renal disease with dialysis scheduled for today. EXAM: PORTABLE CHEST 1 VIEW COMPARISON:  07/09/2016 FINDINGS: Dialysis catheter with lead tips over the lower SVC and cavoatrial junction. No pneumothorax. Heart size is normal. Diffuse interstitial and perihilar alveolar infiltrates consistent with edema. No pleural effusions. No pneumothorax. Mediastinal contours appear intact. Degenerative changes in the shoulders. Postoperative change on the left shoulder. IMPRESSION: Interstitial and airspace infiltrates in the lungs consistent with pulmonary edema, developing since previous study. Electronically Signed   By: Lucienne Capers M.D.   On: 07/25/2016 05:15    ROS: Appetite decreased + sob though better now and  not on O2 No CP + constipation No peripheral edema No dysuria Some numbness of feet   PHYSICAL EXAM: Blood pressure 122/87, pulse 86, temperature 97.6 F (36.4 C), temperature source Oral, resp. rate (!) 31, height 5\' 7"  (1.702 m), weight 62.1 kg (137 lb), SpO2 99 %. HEENT: PERRLA EOMI  In no resp distress NECK:Rt IJ perm cath LUNGS:BiBasilar crackles CARDIAC:RRR wo MRG ABD:+ BS NTND No HSM EXT:No edema  Lt AVF + bruit NEURO:CNI Ox3 no asterixis   [facility], MWF, DW 137#,2K2.5Ca/3.5hr/BFR 350 ? If with catheter or AVF/ hectorol 2.75mcg/Micera 184mcg  Assessment: 1. Pulm edema 2. ESRD 3. Anemia on Micera 4. Sec HPTH  on hectorol PLAN: 1. HD today.  Seems like he needs a lower DW 2. Cont hectorol 3. Recheck CXR in AM and then decide if needs further HD.  His otupt Dx unit needs to do a better job of managing this   Bassam Dresch T 07/25/2016, 9:23 AM

## 2016-07-25 NOTE — Progress Notes (Signed)
Pt arrived from [facility]. Pt alert and stable at this time on Bipap. VS stable. Report given to oncoming RN Verdis Frederickson.

## 2016-07-25 NOTE — ED Provider Notes (Signed)
[place] DEPT [facility] Provider Note   CSN: [id number] Arrival date & time: 07/25/16  0358     History   Chief Complaint Chief Complaint  Patient presents with  . Shortness of Breath    HPI [name] is a 71 y.o. male.  The history is provided by the spouse. The history is limited by the condition of the patient.  Shortness of Breath  This is a recurrent problem. The average episode lasts 10 minutes. The problem occurs continuously.The current episode started less than 1 hour ago. The problem has not changed since onset.Associated symptoms include PND and orthopnea. Pertinent negatives include no swollen glands, no vomiting and no leg swelling. Associated symptoms comments: Shortness of breath. It is unknown what precipitated the problem. He has tried nothing for the symptoms. The treatment provided no relief. He has had prior hospitalizations. He has had prior ED visits. Associated medical issues include heart failure.    Past Medical History:  Diagnosis Date  . 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 Anemia, iron deficiency 03/24/2011   a. Recurrent GI bleed, tx with periodic iron infusions.  . 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 AVM (arteriovenous malformation) of duodenum, acquired    egds in 01/2012, 04/2011  . BPH (benign prostatic hyperplasia)   . CAD (coronary artery disease)    a. Cath 09/2012: moderate borderline CAD in mid LAD/small diagonal branch, mild RCA stenosis, to be managed medically   . Chronic lower back pain   . Diabetic peripheral neuropathy ([place]) 2014   foot pain.  . Fatty liver    on CT of 11/2010  . GERD (gastroesophageal reflux disease)   . GI bleed    a. Recurrent GI bleed, tx with IV iron. b. Per heme notes - likely AVMs 04/2012 (tx with cauterization several months ago).  . Hematuria    a. Urology note scan from 07/2012: cystoscopy without evidence for bladder lesion, only lateral hypertrophy of posterior urethra, bladder impression from  BPH. b. Pt states he had "some tests" scheduled for later in July 2014.  . High cholesterol   . Hypertension   . 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 Orthostatic hypotension    a. Tx with florinef.  . Peripheral neuropathy   . Polyp, colonic    Colonoscopy 01/2012 "benign" polyp  . Prostate cancer ([place])   . Sleep apnea    "had mask; couldn't sleep in it" (09/20/2012)  . Smoker   . Stage III chronic kidney disease    a. Stage 3 (DM with complications ->CKD, peripheral neuropathy).  . Stroke ([place])    x 2  . Type II diabetes mellitus ([place])    a. Dx 1994, uncontrolled.     Patient Active Problem List   Diagnosis Date Noted  . Hypertensive emergency 07/06/2016  . Flash pulmonary edema ([place]) 07/06/2016  . SOB (shortness of breath) 07/06/2016  . Hypertensive urgency 07/06/2016  . Anemia in chronic kidney disease, on chronic dialysis ([place])   . Anemia due to chronic kidney disease   . Diabetic retinopathy ([place]) 01/16/2016  . Dysphagia 12/21/2015  . Environmental allergies 12/04/2015  . Allergic rhinitis 10/28/2015  . ESRD (end stage renal disease) ([place]) 10/28/2015  . Stroke ([place]) 04/06/2015  . AVM (arteriovenous malformation) of small bowel, acquired with hemorrhage ([place]) 04/06/2015  . Malnutrition of moderate degree 04/05/2015  . Acute renal failure superimposed on stage 4 chronic kidney disease ([place]) 04/04/2015  . Symptomatic anemia 04/03/2015  . Benign  paroxysmal positional vertigo 09/26/2014  . Weakness of right lower extremity 02/28/2014  . Metatarsalgia of both feet 09/13/2013  . Equinus deformity of foot, acquired 09/13/2013  . Diabetes mellitus type 2, controlled, with complications ([place]) 27/74/1287  . GERD (gastroesophageal reflux disease) 03/24/2013  . GI bleed 02/28/2013  . Cerebral infarction ([place]) 11/17/2012  . Dizziness and giddiness 10/31/2012  . Prostate cancer ([place]) 10/27/2012  . Diabetic neuropathy ([place]) 10/04/2012  . CAD (coronary artery  disease) 10/04/2012  . Hoarseness 06/26/2012  . Hematuria 05/24/2012  . Tinea pedis 05/24/2012  . Back pain 02/23/2012  . Erectile dysfunction 02/03/2012  . Type 2 diabetes, uncontrolled, with neuropathy ([place]) 02/01/2012  . Essential hypertension 02/01/2012  . Tobacco use 02/01/2012  . Hyperlipidemia 02/01/2012  . Anemia of renal disease 05/14/2011    Past Surgical History:  Procedure Laterality Date  . ENTEROSCOPY N/A 03/01/2013   Procedure: ENTEROSCOPY;  Surgeon: Inda Castle, MD;  Location: [place];  Service: Endoscopy;  Laterality: N/A;  . ENTEROSCOPY N/A 06/26/2016   Procedure: ENTEROSCOPY;  Surgeon: Milus Banister, MD;  Location: [place];  Service: Endoscopy;  Laterality: N/A;  this is a push enteroscopy  . GIVENS CAPSULE STUDY N/A 04/05/2015   Procedure: GIVENS CAPSULE STUDY;  Surgeon: Gatha Mayer, MD;  Location: [place];  Service: Endoscopy;  Laterality: N/A;  . INGUINAL HERNIA REPAIR Right 2011  . LEFT HEART CATHETERIZATION WITH CORONARY ANGIOGRAM N/A 09/21/2012   Procedure: LEFT HEART CATHETERIZATION WITH CORONARY ANGIOGRAM;  Surgeon: Peter M Martinique, MD;  Location: [facility] CATH LAB;  Service: Cardiovascular;  Laterality: N/A;  . LUMBAR DISC SURGERY  1980's  . LYMPHADENECTOMY Bilateral 01/19/2013   Procedure: LYMPHADENECTOMY;  Surgeon: Bernestine Amass, MD;  [facility];  Service: Urology;  Laterality: Bilateral;  . ROBOT ASSISTED LAPAROSCOPIC RADICAL PROSTATECTOMY N/A 01/19/2013   Procedure: ROBOTIC ASSISTED LAPAROSCOPIC RADICAL PROSTATECTOMY;  Surgeon: Bernestine Amass, MD;  [facility];  Service: Urology;  Laterality: N/A;  . SHOULDER OPEN ROTATOR CUFF REPAIR Left 1980's       Home Medications    Prior to Admission medications   Medication Sig Start Date End Date Taking? Authorizing Provider  benzonatate (TESSALON) 100 MG capsule Take 1 capsule (100 mg total) by mouth 3 (three) times daily as needed for cough. 04/30/16   Saguier, Percell Miller, PA-C    Carboxymethylcellul-Glycerin (CLEAR EYES FOR DRY EYES) 1-0.25 % SOLN Place 1 drop into both eyes daily as needed (dry eyes).    [provider]  carvedilol (COREG) 3.125 MG tablet Take 1 tablet (3.125 mg total) by mouth 2 (two) times daily with a meal. 06/19/16   Saguier, Percell Miller, PA-C  clopidogrel (PLAVIX) 75 MG tablet Take 1 tablet (75 mg total) by mouth daily. 06/19/16   Saguier, Percell Miller, PA-C  gabapentin (NEURONTIN) 300 MG capsule Take 1 capsule (300 mg total) by mouth at bedtime. 07/07/16   Domenic Polite, MD  omeprazole (PRILOSEC) 40 MG capsule Take 1 capsule (40 mg total) by mouth daily. 06/19/16   Saguier, Percell Miller, PA-C  rosuvastatin (CRESTOR) 40 MG tablet Take 1 tablet (40 mg total) by mouth daily. 06/19/16   Saguier, Percell Miller, PA-C    Family History Family History  Problem Relation Age of Onset  . Stroke Father        Died at 91  . Diabetes Mother   . Heart attack Brother        Died at 74  . Diabetes Sister   . Stomach cancer Brother   .  Heart attack Sister     Social History Social History  Substance Use Topics  . Smoking status: Current Some Day Smoker    Packs/day: 1.00    Years: 43.00    Types: Cigarettes    Start date: 04/15/1968  . Smokeless tobacco: Never Used     Comment: 07-22--2016  still smoking  . Alcohol use No     Comment: 09/20/2012 "Used to; stopped ~ 2009; never had problem w/it"     Allergies   Patient has no known allergies.   Review of Systems Review of Systems  Unable to perform ROS: Severe respiratory distress  Respiratory: Positive for shortness of breath.   Cardiovascular: Positive for orthopnea and PND. Negative for leg swelling.  Gastrointestinal: Negative for vomiting.     Physical Exam Updated Vital Signs BP (!) 191/138 (BP Location: Right Arm)   Pulse (!) 135   Resp (!) 24   Ht 5\' 7"  (1.702 m)   Wt 137 lb (62.1 kg)   BMI 21.46 kg/m   Physical Exam  Constitutional: He appears well-developed and well-nourished. He appears  distressed.  HENT:  Head: Normocephalic and atraumatic.  Nose: Nose normal.  Eyes: Conjunctivae are normal. Pupils are equal, round, and reactive to light.  Neck: JVD present.  Cardiovascular: Regular rhythm, normal heart sounds and intact distal pulses.  Tachycardia present.   Pulmonary/Chest: No stridor. Tachypnea noted. He is in respiratory distress. He has decreased breath sounds. He has no wheezes.  Abdominal: Soft. Bowel sounds are normal. He exhibits no mass. There is no tenderness. There is no rebound and no guarding.  Musculoskeletal: Normal range of motion. He exhibits no edema or tenderness.  Neurological: He is alert. He displays normal reflexes.  Skin: Skin is warm and dry. Capillary refill takes less than 2 seconds.  Psychiatric: He has a normal mood and affect.     ED Treatments / Results   Vitals:   07/25/16 0415 07/25/16 0500  BP: (!) 191/138 (!) 190/104  Pulse: (!) 131 (!) 127  Resp: (!) 31 (!) 31    Labs (all labs ordered are listed, but only abnormal results are displayed)  Results for orders placed or performed during the hospital encounter of 07/25/16  CBC with Differential/Platelet  Result Value Ref Range   WBC 8.3 4.0 - 10.5 K/uL   RBC 4.68 4.22 - 5.81 MIL/uL   Hemoglobin 14.3 13.0 - 17.0 g/dL   HCT 43.2 39.0 - 52.0 %   MCV 92.3 78.0 - 100.0 fL   MCH 30.6 26.0 - 34.0 pg   MCHC 33.1 30.0 - 36.0 g/dL   RDW 18.6 (H) 11.5 - 15.5 %   Platelets 180 150 - 400 K/uL   Neutrophils Relative % 67 %   Neutro Abs 5.6 1.7 - 7.7 K/uL   Lymphocytes Relative 16 %   Lymphs Abs 1.3 0.7 - 4.0 K/uL   Monocytes Relative 12 %   Monocytes Absolute 1.0 0.1 - 1.0 K/uL   Eosinophils Relative 4 %   Eosinophils Absolute 0.3 0.0 - 0.7 K/uL   Basophils Relative 1 %   Basophils Absolute 0.0 0.0 - 0.1 K/uL  Troponin I  Result Value Ref Range   Troponin I <0.03 <0.03 ng/mL  Brain natriuretic peptide  Result Value Ref Range   B Natriuretic Peptide 1,389.4 (H) 0.0 - 100.0  pg/mL  I-Stat Chem 8, ED  Result Value Ref Range   Sodium 142 135 - 145 mmol/L   Potassium 4.9 3.5 -  5.1 mmol/L   Chloride 107 101 - 111 mmol/L   BUN 39 (H) 6 - 20 mg/dL   Creatinine, Ser 9.00 (H) 0.61 - 1.24 mg/dL   Glucose, Bld 163 (H) 65 - 99 mg/dL   Calcium, Ion 1.06 (L) 1.15 - 1.40 mmol/L   TCO2 27 0 - 100 mmol/L   Hemoglobin 14.6 13.0 - 17.0 g/dL   HCT 43.0 39.0 - 52.0 %  I-Stat arterial blood gas, ED  Result Value Ref Range   pH, Arterial 7.362 7.350 - 7.450   pCO2 arterial 45.0 32.0 - 48.0 mmHg   pO2, Arterial 292.0 (H) 83.0 - 108.0 mmHg   Bicarbonate 25.6 20.0 - 28.0 mmol/L   TCO2 27 0 - 100 mmol/L   O2 Saturation 100.0 %   Patient temperature 98.6 F    Collection site RADIAL, ALLEN'S TEST ACCEPTABLE    Drawn by RT    Sample type ARTERIAL    Dg Chest 2 View  Result Date: 07/09/2016 CLINICAL DATA:  Difficulty breathing for several weeks. Low ejection fraction EXAM: CHEST  2 VIEW COMPARISON:  07/05/2016 FINDINGS: Central venous line overlies normal cardiac silhouette. Significant improvement central venous congestion and pulmonary edema seen on comparison exam. No pleural fluid. No pneumothorax. Lungs are hyperinflated. IMPRESSION: 1. Significant improvement in pulmonary edema pattern/interstitial edema pattern. 2. Hyperinflated lungs. Electronically Signed   By: Suzy Bouchard M.D.   On: 07/09/2016 15:00   Dg Chest Portable 1 View  Result Date: 07/25/2016 CLINICAL DATA:  Acute severe shortness of breath for 1 hour. End-stage renal disease with dialysis scheduled for today. EXAM: PORTABLE CHEST 1 VIEW COMPARISON:  07/09/2016 FINDINGS: Dialysis catheter with lead tips over the lower SVC and cavoatrial junction. No pneumothorax. Heart size is normal. Diffuse interstitial and perihilar alveolar infiltrates consistent with edema. No pleural effusions. No pneumothorax. Mediastinal contours appear intact. Degenerative changes in the shoulders. Postoperative change on the left  shoulder. IMPRESSION: Interstitial and airspace infiltrates in the lungs consistent with pulmonary edema, developing since previous study. Electronically Signed   By: Lucienne Capers M.D.   On: 07/25/2016 05:15   Dg Chest Port 1 View  Result Date: 07/05/2016 CLINICAL DATA:  Acute onset of respiratory distress. Initial encounter. EXAM: PORTABLE CHEST 1 VIEW COMPARISON:  Chest radiograph performed 07/02/2016 FINDINGS: The lungs are well-aerated. Vascular congestion is noted. Increased interstitial markings raise concern for pulmonary edema. There is no evidence of pleural effusion or pneumothorax. The cardiomediastinal silhouette is borderline normal in size. A right-sided dual-lumen catheter is noted ending overlying the right atrium. No acute osseous abnormalities are seen. IMPRESSION: Vascular congestion. Increased interstitial markings raise concern for pulmonary edema. Electronically Signed   By: Garald Balding M.D.   On: 07/05/2016 23:18    EKG   EKG Interpretation  Date/Time:  Friday Jul 25 2016 04:38:49 EDT Ventricular Rate:  138 PR Interval:    QRS Duration: 99 QT Interval:  308 QTC Calculation: 467 R Axis:   36 Text Interpretation:  Sinus tachycardia Anteroseptal infarct, old Abnrm T, consider ischemia, anterolateral lds Artifact in lead(s) II III aVR aVL aVF and baseline wander in lead(s) V6 Confirmed by Randal Buba, Lanea Vankirk ([id number]) on 07/25/2016 4:46:07 AM      MDM Reviewed: previous chart, nursing note and vitals Reviewed previous: labs and x-ray Interpretation: labs, ECG and x-ray (elevated creatinine and BUN normal potassium normal troponin and elevated BNP which is higher today than on previous admission, slight acidodid on ABG by me,  pulmonary edema by  me on CXR ) Total time providing critical care: 75-105 minutes (titrating labetolol drip). This excludes time spent performing separately reportable procedures and services. Consults: critical care (nephrology  )   Procedures Procedures (including critical care time)  BIPAP initiated by me immediately on arrival   CRITICAL CARE Performed by: Carlisle Beers Total critical care time: 91 minutes Critical care time was exclusive of separately billable procedures and treating other patients. Critical care was necessary to treat or prevent imminent or life-threatening deterioration. Critical care was time spent personally by me on the following activities: development of treatment plan with patient and/or surrogate as well as nursing, discussions with consultants, evaluation of patient's response to treatment, examination of patient, obtaining history from patient or surrogate, ordering and performing treatments and interventions, ordering and review of laboratory studies, ordering and review of radiographic studies, pulse oximetry and re-evaluation of patient's condition.  Medications Ordered in ED Medications  labetalol (NORMODYNE,TRANDATE) 500 mg in dextrose 5 % 125 mL (4 mg/mL) infusion (not administered)  furosemide (LASIX) injection 40 mg (40 mg Intravenous Given 07/25/16 0444)  nitroGLYCERIN (NITROGLYN) 2 % ointment 1 inch (1 inch Topical Given 07/25/16 0444)    511: Case d/w Dr. Lamonte Sakai who will accept the patient to the ICU at [facility]  522: case d/w Dr. Arty Baumgartner who will see the patient in consult for dialysis   Final Clinical Impressions(s) / ED Diagnoses  Acute pulmonary edema and hypertensive emergency: Admit to ICU.   Respirations stable and improving on BIPAP.  BP and HR markedly improved on labetolol IV   Srihan Brutus, MD 07/25/16 [id number]

## 2016-07-26 ENCOUNTER — Inpatient Hospital Stay (HOSPITAL_COMMUNITY): Payer: Medicare Other

## 2016-07-26 DIAGNOSIS — N186 End stage renal disease: Secondary | ICD-10-CM

## 2016-07-26 DIAGNOSIS — J810 Acute pulmonary edema: Secondary | ICD-10-CM

## 2016-07-26 DIAGNOSIS — I161 Hypertensive emergency: Secondary | ICD-10-CM

## 2016-07-26 LAB — CBC
HEMATOCRIT: 38.5 % — AB (ref 39.0–52.0)
HEMOGLOBIN: 12.4 g/dL — AB (ref 13.0–17.0)
MCH: 29.1 pg (ref 26.0–34.0)
MCHC: 32.2 g/dL (ref 30.0–36.0)
MCV: 90.4 fL (ref 78.0–100.0)
Platelets: 156 10*3/uL (ref 150–400)
RBC: 4.26 MIL/uL (ref 4.22–5.81)
RDW: 18.5 % — ABNORMAL HIGH (ref 11.5–15.5)
WBC: 6.5 10*3/uL (ref 4.0–10.5)

## 2016-07-26 LAB — BASIC METABOLIC PANEL
ANION GAP: 13 (ref 5–15)
BUN: 23 mg/dL — ABNORMAL HIGH (ref 6–20)
CALCIUM: 8.1 mg/dL — AB (ref 8.9–10.3)
CHLORIDE: 98 mmol/L — AB (ref 101–111)
CO2: 24 mmol/L (ref 22–32)
CREATININE: 6.59 mg/dL — AB (ref 0.61–1.24)
GFR calc non Af Amer: 8 mL/min — ABNORMAL LOW (ref >60)
GFR, EST AFRICAN AMERICAN: 9 mL/min — AB (ref >60)
Glucose, Bld: 91 mg/dL (ref 65–99)
Potassium: 4.1 mmol/L (ref 3.5–5.1)
SODIUM: 135 mmol/L (ref 135–145)

## 2016-07-26 LAB — GLUCOSE, CAPILLARY
GLUCOSE-CAPILLARY: 107 mg/dL — AB (ref 65–99)
GLUCOSE-CAPILLARY: 89 mg/dL (ref 65–99)
Glucose-Capillary: 95 mg/dL (ref 65–99)

## 2016-07-26 LAB — PHOSPHORUS: PHOSPHORUS: 4.7 mg/dL — AB (ref 2.5–4.6)

## 2016-07-26 LAB — MAGNESIUM: MAGNESIUM: 1.9 mg/dL (ref 1.7–2.4)

## 2016-07-26 NOTE — Discharge Summary (Signed)
DISCHARGE SUMMARY  [name]  MR#: [id number]  DOB:1945/07/17  Date of Admission: 07/25/2016 Date of Discharge: 07/26/2016  Attending Physician:See Beharry T  Patient's XQJ:[name], [name]  Consults: PCCM Nephrology  Disposition: D/C home   Follow-up Appts: Follow-up Information    Saguier, Troy Miller, PA-C Follow up in 1 week(s).   Specialties:  Internal Medicine, Family Medicine Contact information: [address] [place]  [phone]           Discharge Diagnoses: Acute Hypoxic Respiratory Failure due to flash pulmonary edema  HTN Urgency ESRD on HD M/W/F Acute on chronic diastolic CHF DM2 Anemia of chronic kidney disease CAD HLD  Initial presentation: 71 year old male w/ hx of Anemia, AVM, BPH, CAD, DM, GERD, GIB, Prostate CA, ESRD on HD MWF, and CVA who presented to [facility] ED with sudden onset shortness of breath. Upon arrival BP was 190/138 and patient was severely short of breath requiring BiPAP. He was placed on a labetalol gtt and transferred to [facility] for further management. Patient denied missing a HD session or BP medications. CXR revealed pulmonary edema. He has a similar presentation/admission 4/22-4/23/2018.  Hospital Course: The pt was admitted to the acute units.  BIPAP and the BB gtt were continued in the ICU.  Nephrology was consulted and attended to emergent extra HD.  With HD the pt was able to be liberated from BIPAP, as well as the labetalol gtt.  A f/u CXR on 5/12 noted complete resolution of his pulmonary edema.  His BP was controlled on oral meds, and he was asymptomatic.  The pt was anxious to be d/c home, and the medical team agreed it was safe to do so.  Nephrology has suggested lowering his EDS wo 129.6#, and agrees that d/c home is reasonable.    Allergies as of 07/26/2016   No Known Allergies     Medication List    STOP taking these medications   benzonatate 100 MG  capsule Commonly known as:  TESSALON     TAKE these medications   carvedilol 3.125 MG tablet Commonly known as:  COREG Take 1 tablet (3.125 mg total) by mouth 2 (two) times daily with a meal.   clopidogrel 75 MG tablet Commonly known as:  PLAVIX Take 1 tablet (75 mg total) by mouth daily.   gabapentin 300 MG capsule Commonly known as:  NEURONTIN Take 1 capsule (300 mg total) by mouth at bedtime.   omeprazole 40 MG capsule Commonly known as:  PRILOSEC Take 1 capsule (40 mg total) by mouth daily.   rosuvastatin 40 MG tablet Commonly known as:  CRESTOR Take 1 tablet (40 mg total) by mouth daily.       Day of Discharge BP 121/71   Pulse 79   Temp 98 F (36.7 C) (Oral)   Resp 16   Ht 5\' 7"  (1.702 m)   Wt 60.1 kg (132 lb 7.9 oz)   SpO2 98%   BMI 20.75 kg/m   Physical Exam: General: No acute respiratory distress Lungs: Clear to auscultation bilaterally without wheezes or crackles Cardiovascular: Regular rate and rhythm without murmur gallop or rub normal S1 and S2 Abdomen: Nontender, nondistended, soft, bowel sounds positive, no rebound, no ascites, no appreciable mass Extremities: No significant cyanosis, clubbing, or edema bilateral lower extremities  Basic Metabolic Panel:  Recent Labs Lab 07/25/16 0530 07/25/16 1125 07/26/16 0226  NA 142 140 135  K 4.9 5.2* 4.1  CL 107 104  98*  CO2  --  25 24  GLUCOSE 163* 126* 91  BUN 39* 39* 23*  CREATININE 9.00* 9.17* 6.59*  CALCIUM  --  8.3* 8.1*  MG  --   --  1.9  PHOS  --  5.2* 4.7*    Liver Function Tests:  Recent Labs Lab 07/25/16 1125  ALBUMIN 2.9*   CBC:  Recent Labs Lab 07/25/16 0430 07/25/16 0530 07/26/16 0226  WBC 8.3  --  6.5  NEUTROABS 5.6  --   --   HGB 14.3 14.6 12.4*  HCT 43.2 43.0 38.5*  MCV 92.3  --  90.4  PLT 180  --  156    Cardiac Enzymes:  Recent Labs Lab 07/25/16 0430  TROPONINI <0.03   BNP (last 3 results)  Recent Labs  07/25/16 0430  BNP 1,389.4*    ProBNP  (last 3 results)  Recent Labs  07/09/16 1438  PROBNP 1,044.0*    CBG:  Recent Labs Lab 07/25/16 0827 07/25/16 1554 07/25/16 2038 07/26/16 0033 07/26/16 0447  GLUCAP 126* 79 105* 89 95   Time spent in discharge (includes decision making & examination of pt): 28 minutes  07/26/2016, 8:32 AM   Cherene Altes, MD [facility] Office  [phone] Pager [phone]  On-Call/Text Page:      [email or web address]      password [facility]

## 2016-07-26 NOTE — Discharge Instructions (Signed)
Basic Metabolic Panel Why am I having this test? A basic metabolic panel measures levels of the following substances in your blood:  Glucose. Glucose is a simple sugar that serves as the main source of energy for your body.  Creatinine. Creatinine is a waste product of normal muscle activity. It is excreted from the body by the kidneys.  Blood urea nitrogen (BUN). Urea nitrogen is a waste product of protein breakdown. It is produced when excess protein in your body is broken down and used for energy. It is excreted by the kidneys.  Electrolytes. Electrolytes are negatively or positively charged particles that are dissolved in the water of different body compartments. This includes the serum portion of blood, water inside cells, and water outside cells. Concentrations of electrolytes vary among the different fluid compartments. Electrolytes are tightly regulated to maintain a salt-water and acid-base balance in the body. The electrolytes measured in a basic metabolic panel include:  Potassium.  Sodium.  Chloride.  Calcium.  Bicarbonate. What kind of sample is taken? A blood sample is required for this test. It is usually collected by inserting a needle into a vein. How do I prepare for this test? Your health care provider may ask you to avoid eating or drinking anything before your blood sample is taken. Do not eat or drink anything after midnight on the night before the procedure or as directed by your health care provider. What are the reference ranges? Reference ranges are considered healthy ranges established after testing a large group of healthy people. Reference ranges may vary among different people, labs, and hospitals. It is your responsibility to obtain your test results. Ask the lab or department performing the test when and how you will get your results. The following are reference ranges for each part of a basic metabolic panel: Glucose   Cord: 45-96 mg/dL or 2.5-5.3  mmol/L (SI units).  Premature infant: 20-60 mg/dL or 1.1-3.3 mmol/L.  Neonate: 30-60 mg/dL or 1.7-3.3 mmol/L.  Infant: 40-90 mg/dL or 2.2-5 mmol/L.  Child under 60 years old: 60-100 mg/dL or 3.3-5.5 mmol/L.  Adult or child over 73 years old:  Fasting: 70-110 mg/dL or less than 6.1 mmol/L.  Random (nonfasting or casual): less than or equal to 200 mg/dL or less than 11.1 mmol/L.  Elderly: increase in normal range after age 10 years. Creatinine   Child under 81 years old: 0.1-0.4 mg/dL.  Child 34-9 years old: 0.2-0.5 mg/dL.  Child 7-54 years old: 0.3-0.6 mg/dL.  Child or adolescent 28-32 years old: 0.4-1 mg/dL.  Adult 38-25 years old:  Male: 0.5-1 mg/dL.  Male: 0.6-1.2 mg/dL.  Adult 35-91 years old:  Male: 0.5-1.1 mg/dL.  Male: 0.6-1.3 mg/dL.  Adult 74 years old and above:  Male: 0.5-1.2 mg/dL.  Male: 0.7-1.3 mg/dL. BUN   Cord: 21-40 mg/dL.  Newborn: 3-12 mg/dL.  Infant: 5-18 mg/dL.  Child: 5-18 mg/dL.    Elderly: may be slightly higher than adult. Potassium   Newborn: 3.9-5.9 mEq/L.    Child: 3.4-4.7 mEq/L.  Adult or elderly: 3.5-5 mEq/L or 3.5-5 mmol/L (SI units). Sodium     Infant: 134-150 mEq/L.  Child: 136-145 mEq/L.  Adult or elderly: 136-145 mEq/L or 136-145 mmol/L (SI units). Chloride   Premature infant: 95-110 mEq/L.  Newborn: 96-106 mEq/L.  Child: 90-110 mEq/L.  Adult or elderly: 98-106 mEq/L or 98-106 mmol/L (SI units). Calcium   Total calcium:  Newborn under 59 days old: 7.6-10.4 mg/dL or 1.9-2.6 mmol/L.  Umbilical: 3-57.0 mg/dL or 2.25-2.88 mmol/L.  10  days to 70 years old: 9-10.6 mg/dL or 2.30-2.65 mmol/L.  Child: 8.8-10.8 mg/dL or 2.2-2.7 mmol/L.  Adult: 9-10.5 mg/dL or 2.25-2.62 mmol/L.  Ionized calcium:  Newborn: 4.20-5.58 mg/dL or 1.05-1.37 mmol/L.  2 months to 70 years old: 4.80-5.52 mg/dL or 1.20-1.38 mmol/L.  Adult: 4.5-5.6 mg/dL or 1.05-1.30 mmol/L. Bicarbonate   Newborn: 13-22  mEq/L.  Infant: 20-28 mEq/L.  Child: 20-28 mEq/L.  Adult or elderly: 23-30 mEq/L or 23-30 mmol/L (SI units). What do the results mean? Diet and levels of activity can have an effect on your test results. Sometimes they can be the cause of values that are outside of normal limits. However, sometimes values outside normal limits can indicate a medical disorder.  Glucose:  Abnormally high glucose levels (hyperglycemia) are usually associated with prediabetes mellitus and diabetes mellitus. They can also occur with severe stress on the body. This can come from surgery or events such as stroke or trauma. Overactive thyroid gland and pancreatitis or pancreatic cancer can also cause abnormally high glucose levels.  Abnormally low glucose levels (hypoglycemia) can occur with underactive thyroid gland and rare insulin-secreting tumors (insulinoma).  Creatinine:  Abnormally high creatinine levels are most commonly seen in kidney failure. They can also be seen with overactive thyroid (hyperthyroidism), conditions related to overgrowth of the body, abnormal breakdown of muscle tissue, and early muscular dystrophy.  Abnormally low creatinine levels can indicate low muscle mass associated with malnutrition or late-stage muscular dystrophy.  BUN:  Abnormally high BUN levels generally mean that your kidneys are not functioning normally.  Abnormally low BUN levels can be seen with malnutrition and liver failure.  Potassium:  Abnormally high potassium levels are most often seen with kidney disease, massive destruction of red blood cells, and adrenal gland failure.  Abnormally low potassium levels are seen with excessive levels of the hormone aldosterone.  Sodium:  Abnormally high sodium levels can be seen with dehydration, excessive thirst, and urination due to abnormally low levels of antidiuretic hormone (diabetes insipidus). They can also be seen with excessive levels of aldosterone or cortisol  in the body.  Abnormally low levels of sodium can be seen with congestive heart failure, cirrhosis of the liver, kidney failure, and the syndrome of inappropriate antidiuretic hormone (SIADH).  Chloride:  Abnormally high levels of chloride can be seen with acute kidney failure, diabetes insipidus, prolonged diarrhea, and poisoning with aspirin or bromide.  Abnormally low levels of chloride can be seen with prolonged vomiting, acute adrenal gland failure, and SIADH.  Calcium:  Abnormally high levels of calcium can occur with excessive activity of the parathyroid glands, certain cancers, and a type of inflammation seen in sarcoidosis and tuberculosis.  Abnormally low levels of calcium can be seen with underactive parathyroid glands, vitamin D deficiency, and acute pancreatitis.  Bicarbonate:  Abnormally high bicarbonate levels are seen after prolonged vomiting and diuretic therapy, which lead to a decrease in the amount of acid in the body (metabolic alkalosis). They can also be seen in conditions that increase the amount of bicarbonate in the body. These conditions include rare hereditary disorders that interfere with how your kidneys handle electrolytes.  Abnormally low bicarbonate levels are seen with conditions that cause your body to produce too much acid (metabolic acidosis). These conditions include uncontrolled diabetes mellitus and poisoning with aspirin, methanol, or antifreeze (ethylene glycol). Talk with your health care provider to discuss your results, treatment options, and if necessary, the need for more tests. Talk with your health care provider if you have  any questions about your results. Talk with your health care provider to discuss your results, treatment options, and if necessary, the need for more tests. Talk with your health care provider if you have any questions about your results. This information is not intended to replace advice given to you by your health care  provider. Make sure you discuss any questions you have with your health care provider. Document Released: 03/26/2004 Document Revised: 11/05/2015 Document Reviewed: 08/01/2013 Elsevier Interactive Patient Education  2017 [employer].  Calcium Test Why am I having this test? The calcium test is performed to evaluate parathyroid function and bone metabolism by measuring the amount of calcium in the blood. This test can help monitor people with overactive parathyroid gland (hyperparathyroidism), kidney failure, kidney transplantation, and various types of cancerous tumors (malignancies). It is also used to monitor calcium levels after large blood transfusions. What kind of sample is taken? A blood sample is required for this test. It is usually collected by inserting a needle into a vein. How do I prepare for this test? There is no preparation or fasting required for this test. However, this test is sometimes done along with other tests, so you may be asked to fast if this is necessary for other tests being done the same day. What are the reference ranges? Reference ranges are considered healthy ranges established after testing a large group of healthy people. Reference ranges may vary among different people, labs, and hospitals. It is your responsibility to obtain your test results. Ask the lab or department performing the test when and how you will get your results. Reference ranges for this test are as follows: Total Calcium   Less than 56 days old: 7.6-10.4 mg/dL or 1.9-2.6 mmol/L.  Umbilical: 3-53.2 mg/dL or 2.25-2.88 mmol/L.  39 days to 70 years old: 9-10.6 mg/dL or 2.3-2.65 mmol/L.  Child: 8.8-10.8 mg/dL or 2.2-2.7 mmol/L.  Adult: 9-10.5 mg/dL or 2.25-2.62 mmol/L. Ionized Calcium   Newborn: 4.2-5.58 mg/dL or 1.05-1.37 mmol/L.  2 months to 70 years old: 4.8-5.52 mg/dL or 1.2-1.38 mmol/L.  Adult: 4.5-5.6 mg/dL or 1.05-1.3 mmol/L. What do the results mean? Values above the  reference ranges could indicate:  Hyperparathyroidism.  Various types of cancer.  Infections or diseases that cause increased vitamin D levels.  Conditions that cause bone destruction.  Overactive thyroid gland (hyperthyroidism).  Consuming too many milk products or antacids.  Other hormone disorders. Values below the reference ranges could indicate:  Underactive parathyroid gland (hypoparathyroidism).  Kidney failure.  Vitamin D deficiency or rickets.  Softening of the bones.  Poor absorption of calcium.  Pancreatitis.  High blood pH (alkalosis).  Low blood levels of albumin. Talk with your health care provider to discuss your results, treatment options, and if necessary, the need for more tests. Talk with your health care provider if you have any questions about your results. Talk with your health care provider to discuss your results, treatment options, and if necessary, the need for more tests. Talk with your health care provider if you have any questions about your results. This information is not intended to replace advice given to you by your health care provider. Make sure you discuss any questions you have with your health care provider. Document Released: 03/25/2004 Document Revised: 11/06/2015 Document Reviewed: 07/26/2013 Elsevier Interactive Patient Education  2017 [employer] Hypertension  Hypertension is another name for high blood pressure. High blood pressure forces your heart to work harder to pump blood. This can cause problems over time. There  are two numbers in a blood pressure reading. There is a top number (systolic) over a bottom number (diastolic). It is best to have a blood pressure below 120/80. Healthy choices can help lower your blood pressure. You may need medicine to help lower your blood pressure if:  Your blood pressure cannot be lowered with healthy choices.  Your blood pressure is higher than 130/80. Follow these instructions at  home: Eating and drinking   If directed, follow the DASH eating plan. This diet includes:  Filling half of your plate at each meal with fruits and vegetables.  Filling one quarter of your plate at each meal with whole grains. Whole grains include whole wheat pasta, brown rice, and whole grain bread.  Eating or drinking low-fat dairy products, such as skim milk or low-fat yogurt.  Filling one quarter of your plate at each meal with low-fat (lean) proteins. Low-fat proteins include fish, skinless chicken, eggs, beans, and tofu.  Avoiding fatty meat, cured and processed meat, or chicken with skin.  Avoiding premade or processed food.  Eat less than 1,500 mg of salt (sodium) a day.  Limit alcohol use to no more than 1 drink a day for nonpregnant women and 2 drinks a day for men. One drink equals 12 oz of beer, 5 oz of wine, or 1 oz of hard liquor. Lifestyle   Work with your doctor to stay at a healthy weight or to lose weight. Ask your doctor what the best weight is for you.  Get at least 30 minutes of exercise that causes your heart to beat faster (aerobic exercise) most days of the week. This may include walking, swimming, or biking.  Get at least 30 minutes of exercise that strengthens your muscles (resistance exercise) at least 3 days a week. This may include lifting weights or pilates.  Do not use any products that contain nicotine or tobacco. This includes cigarettes and e-cigarettes. If you need help quitting, ask your doctor.  Check your blood pressure at home as told by your doctor.  Keep all follow-up visits as told by your doctor. This is important. Medicines   Take over-the-counter and prescription medicines only as told by your doctor. Follow directions carefully.  Do not skip doses of blood pressure medicine. The medicine does not work as well if you skip doses. Skipping doses also puts you at risk for problems.  Ask your doctor about side effects or reactions to  medicines that you should watch for. Contact a doctor if:  You think you are having a reaction to the medicine you are taking.  You have headaches that keep coming back (recurring).  You feel dizzy.  You have swelling in your ankles.  You have trouble with your vision. Get help right away if:  You get a very bad headache.  You start to feel confused.  You feel weak or numb.  You feel faint.  You get very bad pain in your:  Chest.  Belly (abdomen).  You throw up (vomit) more than once.  You have trouble breathing. Summary  Hypertension is another name for high blood pressure.  Making healthy choices can help lower blood pressure. If your blood pressure cannot be controlled with healthy choices, you may need to take medicine. This information is not intended to replace advice given to you by your health care provider. Make sure you discuss any questions you have with your health care provider. Document Released: 08/20/2007 Document Revised: 01/30/2016 Document Reviewed: 01/30/2016 Elsevier Interactive  Patient Education  2017 [employer].

## 2016-07-26 NOTE — Progress Notes (Signed)
S:No SOB  Feels well and wants to go home O:BP 121/71   Pulse 79   Temp 98 F (36.7 C) (Oral)   Resp 16   Ht 5\' 7"  (1.702 m)   Wt 60.1 kg (132 lb 7.9 oz)   SpO2 98%   BMI 20.75 kg/m   Intake/Output Summary (Last 24 hours) at 07/26/16 0802 Last data filed at 07/25/16 2000  Gross per 24 hour  Intake              230 ml  Output             2471 ml  Net            -2241 ml   Weight change: -1.543 kg (-3 lb 6.4 oz) SHF:WYOVZ and alert CVS:RRR Resp:clear Abd:+ BS NTND Ext:No edema  Lt AVF + bruit NEURO:CNI Ox3 no asterixis Rt IJ PC   . carvedilol  3.125 mg Oral BID WC  . Chlorhexidine Gluconate Cloth  6 each Topical Daily  . clopidogrel  75 mg Oral Daily  . gabapentin  300 mg Oral QHS  . heparin  5,000 Units Subcutaneous Q8H  . insulin aspart  2-6 Units Subcutaneous Q4H  . mouth rinse  15 mL Mouth Rinse BID  . pantoprazole  40 mg Oral Q1200  . rosuvastatin  40 mg Oral q1800  . sodium chloride flush  10-40 mL Intracatheter Q12H   Dg Chest Portable 1 View  Result Date: 07/25/2016 CLINICAL DATA:  Acute severe shortness of breath for 1 hour. End-stage renal disease with dialysis scheduled for today. EXAM: PORTABLE CHEST 1 VIEW COMPARISON:  07/09/2016 FINDINGS: Dialysis catheter with lead tips over the lower SVC and cavoatrial junction. No pneumothorax. Heart size is normal. Diffuse interstitial and perihilar alveolar infiltrates consistent with edema. No pleural effusions. No pneumothorax. Mediastinal contours appear intact. Degenerative changes in the shoulders. Postoperative change on the left shoulder. IMPRESSION: Interstitial and airspace infiltrates in the lungs consistent with pulmonary edema, developing since previous study. Electronically Signed   By: Lucienne Capers M.D.   On: 07/25/2016 05:15   BMET    Component Value Date/Time   NA 135 07/26/2016 0226   NA 139 12/13/2015 0901   NA 141 11/15/2015 0917   K 4.1 07/26/2016 0226   K 3.8 12/13/2015 0901   K 4.0  11/15/2015 0917   CL 98 (L) 07/26/2016 0226   CL 102 12/13/2015 0901   CO2 24 07/26/2016 0226   CO2 32 12/13/2015 0901   CO2 27 11/15/2015 0917   GLUCOSE 91 07/26/2016 0226   GLUCOSE 197 (H) 12/13/2015 0901   BUN 23 (H) 07/26/2016 0226   BUN 19 12/13/2015 0901   BUN 18.7 11/15/2015 0917   CREATININE 6.59 (H) 07/26/2016 0226   CREATININE 5.4 (HH) 12/13/2015 0901   CREATININE 5.3 (HH) 11/15/2015 0917   CALCIUM 8.1 (L) 07/26/2016 0226   CALCIUM 9.2 12/13/2015 0901   CALCIUM 8.9 11/15/2015 0917   GFRNONAA 8 (L) 07/26/2016 0226   GFRAA 9 (L) 07/26/2016 0226   CBC    Component Value Date/Time   WBC 6.5 07/26/2016 0226   RBC 4.26 07/26/2016 0226   HGB 12.4 (L) 07/26/2016 0226   HGB 8.6 (L) 12/13/2015 0901   HCT 38.5 (L) 07/26/2016 0226   HCT 25.5 (L) 12/13/2015 0901   PLT 156 07/26/2016 0226   PLT 318 12/13/2015 0901   MCV 90.4 07/26/2016 0226   MCV 98 12/13/2015 0901   MCH 29.1 07/26/2016  0226   MCHC 32.2 07/26/2016 0226   RDW 18.5 (H) 07/26/2016 0226   RDW 14.9 12/13/2015 0901   LYMPHSABS 1.3 07/25/2016 0430   LYMPHSABS 0.8 (L) 12/13/2015 0901   MONOABS 1.0 07/25/2016 0430   EOSABS 0.3 07/25/2016 0430   EOSABS 0.2 12/13/2015 0901   BASOSABS 0.0 07/25/2016 0430   BASOSABS 0.1 12/13/2015 0901     Assessment:  1. Pulm edema, CXR today clear 2. ESRD 3. Anemia 4. Sec HPTH  Plan: 1.  I weighed the pt myself today and his wt is 129.6# (was 137#) Will notify his Dx unit of the new DW.  He can go from renal standpoint   Nyeshia Mysliwiec T

## 2016-07-26 NOTE — Progress Notes (Signed)
Pt alert, oriented x3, calm, cooperative. VS stable. PIV removed. Belongings, AVS papers, education were given to patient and his wife. All questions were answered. Understanding verbalized.  Pt discharged home.

## 2016-07-26 NOTE — Evaluation (Signed)
Physical Therapy Evaluation Patient Details Name: [name] MRN: [id number] DOB: 07-06-1945 Today's Date: 07/26/2016   History of Present Illness  71 year old male with PMH of Anemia, AVM, BPH, CAD, DM, GERD, GI bleed, Prostate Cancer, ESRD on HD MWF, and CVA. 5/11 patient presented to [facility] ED with sudden onset shortness of breath due to flash pulmonary edema.  Clinical Impression  Pt is at or close to baseline functioning and should be safe at home . There are no further acute PT needs.  Will sign off at this time.     Follow Up Recommendations No PT follow up    Equipment Recommendations  None recommended by PT    Recommendations for Other Services       Precautions / Restrictions Precautions Precautions: Fall      Mobility  Bed Mobility Overal bed mobility: Modified Independent                Transfers Overall transfer level: Needs assistance Equipment used: None (vs iv pole) Transfers: Sit to/from Stand Sit to Stand: Min guard         General transfer comment: min guard initially then supervision  Ambulation/Gait Ambulation/Gait assistance: Min guard;Supervision Ambulation Distance (Feet): 300 Feet Assistive device:  (iv pole vs none) Gait Pattern/deviations: Step-through pattern   Gait velocity interpretation: at or above normal speed for age/gender General Gait Details: mildly unsteady improving with time up.  Discussed needing his cane for optimal safety.  Stairs            Wheelchair Mobility    Modified Rankin (Stroke Patients Only)       Balance Overall balance assessment: Needs assistance Sitting-balance support: No upper extremity supported Sitting balance-Leahy Scale: Good     Standing balance support: No upper extremity supported Standing balance-Leahy Scale: Fair Standing balance comment: stable statically, mildly unsteady dynamically                             Pertinent Vitals/Pain Pain Assessment:  No/denies pain    Home Living Family/patient expects to be discharged to:: Private residence Living Arrangements: Spouse/significant other Available Help at Discharge: Family;Available PRN/intermittently Type of Home: House Home Access: Stairs to enter   Entrance Stairs-Number of Steps: 1 Home Layout: One level Home Equipment: Cane - single point Additional Comments: wife works during the day     Prior Function Level of Independence: Independent with assistive device(s)         Comments: Patient drives himself to HD     Hand Dominance   Dominant Hand: Right    Extremity/Trunk Assessment        Lower Extremity Assessment Lower Extremity Assessment: Overall WFL for tasks assessed    Cervical / Trunk Assessment Cervical / Trunk Assessment: Normal  Communication   Communication: No difficulties  Cognition Arousal/Alertness: Awake/alert Behavior During Therapy: WFL for tasks assessed/performed Overall Cognitive Status: Within Functional Limits for tasks assessed                                        General Comments General comments (skin integrity, edema, etc.): HR/EHR assymptomatic, but labile at 80's90's to 180's bpm.  pulse taken manually appears to be much less.  SpO2 in mid 90's     Exercises     Assessment/Plan    PT Assessment Patent does  not need any further PT services  PT Problem List Decreased balance;Decreased mobility;Cardiopulmonary status limiting activity       PT Treatment Interventions      PT Goals (Current goals can be found in the Care Plan section)  Acute Rehab PT Goals PT Goal Formulation: All assessment and education complete, DC therapy    Frequency     Barriers to discharge        Co-evaluation               AM-PAC PT "6 Clicks" Daily Activity  Outcome Measure Difficulty turning over in bed (including adjusting bedclothes, sheets and blankets)?: A Little Difficulty moving from lying on back to  sitting on the side of the bed? : A Little Difficulty sitting down on and standing up from a chair with arms (e.g., wheelchair, bedside commode, etc,.)?: None Help needed moving to and from a bed to chair (including a wheelchair)?: A Little Help needed walking in hospital room?: A Little Help needed climbing 3-5 steps with a railing? : A Little 6 Click Score: 19    End of Session   Activity Tolerance: Patient tolerated treatment well Patient left: in chair;with call [place]/phone within reach;with chair alarm set Nurse Communication: Mobility status PT Visit Diagnosis: Other abnormalities of gait and mobility (R26.89)    Time: 0881-1031 PT Time Calculation (min) (ACUTE ONLY): 28 min   Charges:   PT Evaluation $PT Eval Moderate Complexity: 1 Procedure PT Treatments $Gait Training: 8-22 mins   PT G Codes:        2016/08/16  Donnella Sham, PT [phone]  (pager)  Tessie Fass Chaitra Mast 08-16-2016, 11:25 AM

## 2016-07-28 ENCOUNTER — Telehealth: Payer: Self-pay | Admitting: Behavioral Health

## 2016-07-28 NOTE — Telephone Encounter (Signed)
Attempted to reach patient for TCM/Hospital Follow-up call. Left message for patient to return call when available.    

## 2016-07-29 ENCOUNTER — Telehealth: Payer: Self-pay | Admitting: Medical

## 2016-07-29 NOTE — Telephone Encounter (Signed)
Transition Care Management Follow-up Telephone Call  Date of Admission: 07/25/2016 Date of Discharge: 07/26/2016  Attending Physician:MCCLUNG,JEFFREY T  Patient's IRW:[id number], Troy Miller, PA-C   How have you been since you were released from the hospital? Patient stated, "I'm doing fine".   Do you understand why you were in the hospital? yes   Do you understand the discharge instructions? yes   Where were you discharged to? Home   Items Reviewed:  Medications reviewed: yes  Allergies reviewed: yes, NKA  Dietary changes reviewed: yes, low carb diet  Referrals reviewed: yes, Follow-up in 1 week with PCP.   Functional Questionnaire:   Activities of Daily Living (ADLs):   He states they are independent in the following: ambulation, bathing and hygiene, feeding, continence, grooming, toileting and dressing States they require assistance with the following: None   Any transportation issues/concerns?: no   Any patient concerns? no   Confirmed importance and date/time of follow-up visits scheduled yes, 07/31/16 at 9:15 AM  Provider Appointment booked with Mackie Pai, PA-C.  Confirmed with patient if condition begins to worsen call PCP or go to the ER.  Patient was given the office number and encouraged to call back with question or concerns.  : yes

## 2016-07-29 NOTE — Telephone Encounter (Signed)
Form faxed at [facility]. [phone]

## 2016-07-29 NOTE — Telephone Encounter (Signed)
We signed home health forms. Will you fax it over. Thanks.

## 2016-07-31 ENCOUNTER — Telehealth: Payer: Self-pay | Admitting: Emergency Medicine

## 2016-07-31 ENCOUNTER — Ambulatory Visit (HOSPITAL_BASED_OUTPATIENT_CLINIC_OR_DEPARTMENT_OTHER)
Admission: RE | Admit: 2016-07-31 | Discharge: 2016-07-31 | Disposition: A | Discharge status: Home / Self Care | Payer: Medicare Other | Source: Ambulatory Visit | Attending: Medical | Admitting: Medical

## 2016-07-31 ENCOUNTER — Encounter: Payer: Self-pay | Admitting: Medical

## 2016-07-31 ENCOUNTER — Ambulatory Visit (INDEPENDENT_AMBULATORY_CARE_PROVIDER_SITE_OTHER): Payer: Medicare Other | Admitting: Medical

## 2016-07-31 ENCOUNTER — Inpatient Hospital Stay: Payer: Medicare Other | Admitting: Medical

## 2016-07-31 ENCOUNTER — Telehealth: Payer: Self-pay | Admitting: Medical

## 2016-07-31 VITALS — BP 120/60 | HR 93 | Temp 97.8°F | Resp 16 | Ht 67.0 in | Wt 128.0 lb

## 2016-07-31 DIAGNOSIS — N184 Chronic kidney disease, stage 4 (severe): Secondary | ICD-10-CM | POA: Diagnosis not present

## 2016-07-31 DIAGNOSIS — E1122 Type 2 diabetes mellitus with diabetic chronic kidney disease: Secondary | ICD-10-CM

## 2016-07-31 DIAGNOSIS — I130 Hypertensive heart and chronic kidney disease with heart failure and stage 1 through stage 4 chronic kidney disease, or unspecified chronic kidney disease: Secondary | ICD-10-CM | POA: Diagnosis not present

## 2016-07-31 DIAGNOSIS — E1165 Type 2 diabetes mellitus with hyperglycemia: Secondary | ICD-10-CM

## 2016-07-31 DIAGNOSIS — N189 Chronic kidney disease, unspecified: Secondary | ICD-10-CM

## 2016-07-31 DIAGNOSIS — I5022 Chronic systolic (congestive) heart failure: Secondary | ICD-10-CM

## 2016-07-31 DIAGNOSIS — IMO0002 Reserved for concepts with insufficient information to code with codable children: Secondary | ICD-10-CM

## 2016-07-31 DIAGNOSIS — Z794 Long term (current) use of insulin: Secondary | ICD-10-CM | POA: Diagnosis not present

## 2016-07-31 DIAGNOSIS — I10 Essential (primary) hypertension: Secondary | ICD-10-CM

## 2016-07-31 LAB — COMPREHENSIVE METABOLIC PANEL
ALBUMIN: 4.2 g/dL (ref 3.5–5.2)
ALK PHOS: 128 U/L — AB (ref 39–117)
ALT: 9 U/L (ref 0–53)
AST: 8 U/L (ref 0–37)
BUN: 34 mg/dL — AB (ref 6–23)
CALCIUM: 9.1 mg/dL (ref 8.4–10.5)
CHLORIDE: 100 meq/L (ref 96–112)
CO2: 28 mEq/L (ref 19–32)
Creatinine, Ser: 9.96 mg/dL (ref 0.40–1.50)
GFR: 6.7 mL/min — AB (ref >60.00)
Glucose, Bld: 196 mg/dL — ABNORMAL HIGH (ref 70–99)
POTASSIUM: 6.1 meq/L — AB (ref 3.5–5.1)
SODIUM: 140 meq/L (ref 135–145)
TOTAL PROTEIN: 8.2 g/dL (ref 6.0–8.3)
Total Bilirubin: 0.4 mg/dL (ref 0.2–1.2)

## 2016-07-31 LAB — BRAIN NATRIURETIC PEPTIDE: PRO B NATRI PEPTIDE: 343 pg/mL — AB (ref 0.0–100.0)

## 2016-07-31 NOTE — Progress Notes (Signed)
Subjective:    Patient ID: [name], male    DOB: 08-Jul-1945, 71 y.o.   MRN: [id number]  HPI  Pt on 07-25-2016 had acute severe shortness of breath for one hour and then went to the ED.  Xray in ED showed  CLINICAL DATA:  Acute severe shortness of breath for 1 hour. End-stage renal disease with dialysis scheduled for today. EXAM: PORTABLE CHEST 1 VIEW COMPARISON:  07/09/2016 FINDINGS: Dialysis catheter with lead tips over the lower SVC and cavoatrial junction. No pneumothorax. Heart size is normal. Diffuse interstitial and perihilar alveolar infiltrates consistent with edema. No pleural effusions. No pneumothorax. Mediastinal contours appear intact. Degenerative changes in the shoulders. Postoperative change on the left shoulder. IMPRESSION: Interstitial and airspace infiltrates in the lungs consistent with pulmonary edema, developing since previous study. Electronically Signed   By: Lucienne Capers M.D.   On: 07/25/2016 05:15  Pt bp was very high in ED as well.   Pt was admitted with Acute on chronic Diastolic CHF., ESRD. Pt states his dialysis regimen same. Still Monday, Wed, and Friday.Also anemia of chronic disease. Dx at time of admission as flash pulmonary edema.   Hospital course on dc summary reads: The pt was admitted to the acute units.  BIPAP and the BB gtt were continued in the ICU.  Nephrology was consulted and attended to emergent extra HD.  With HD the pt was able to be liberated from BIPAP, as well as the labetalol gtt.  A f/u CXR on 5/12 noted complete resolution of his pulmonary edema.  His BP was controlled on oral meds, and he was asymptomatic.  The pt was anxious to be d/c home, and the medical team agreed it was safe to do so.  Nephrology has suggested lowering his EDS wo 129.6#, and agrees that d/c home is reasonable.   Pt states since discharge he states he can ambulate well and he is not short of breath.   Pt was not discharge on any diuretics.  Pt echo on  06-15-2016 showed  LV 45%-50%.  Pt does weight himself daily.   Pt states on his yesterday when he was getting dialysis he did talk with his nephrologist.        Review of Systems  Constitutional: Negative for chills, fatigue and fever.  HENT: Negative for congestion, ear pain, rhinorrhea, sinus pain and sinus pressure.   Respiratory: Negative for cough, chest tightness, shortness of breath and wheezing.   Cardiovascular: Negative for chest pain and palpitations.  Gastrointestinal: Negative for abdominal distention, abdominal pain, anal bleeding, blood in stool, diarrhea, rectal pain and vomiting.  Musculoskeletal: Negative for back pain, myalgias and neck stiffness.  Skin: Negative for rash.  Neurological: Negative for dizziness, seizures, weakness and headaches.  Hematological: Negative for adenopathy. Does not bruise/bleed easily.  Psychiatric/Behavioral: Negative for behavioral problems and confusion.   Past Medical History:  Diagnosis Date  . 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 Anemia, iron deficiency 03/24/2011   a. Recurrent GI bleed, tx with periodic iron infusions.  . 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 AVM (arteriovenous malformation) of duodenum, acquired    egds in 01/2012, 04/2011  . BPH (benign prostatic hyperplasia)   . CAD (coronary artery disease)    a. Cath 09/2012: moderate borderline CAD in mid LAD/small diagonal branch, mild RCA stenosis, to be managed medically   . Chronic lower back pain   . Diabetic peripheral neuropathy ([place]) 2014   foot pain.  . Fatty  liver    on CT of 11/2010  . GERD (gastroesophageal reflux disease)   . GI bleed    a. Recurrent GI bleed, tx with IV iron. b. Per heme notes - likely AVMs 04/2012 (tx with cauterization several months ago).  . Hematuria    a. Urology note scan from 07/2012: cystoscopy without evidence for bladder lesion, only lateral hypertrophy of posterior urethra, bladder impression from BPH. b. Pt states he  had "some tests" scheduled for later in July 2014.  . High cholesterol   . Hypertension   . 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 Orthostatic hypotension    a. Tx with florinef.  . Peripheral neuropathy   . Polyp, colonic    Colonoscopy 01/2012 "benign" polyp  . Prostate cancer ([place])   . Sleep apnea    "had mask; couldn't sleep in it" (09/20/2012)  . Smoker   . Stage III chronic kidney disease    a. Stage 3 (DM with complications ->CKD, peripheral neuropathy).  . Stroke ([place])    x 2  . Type II diabetes mellitus ([place])    a. Dx 1994, uncontrolled.      Social History   Social History  . Marital status: Married    Spouse name: N/A  . Number of children: 2  . Years of education: N/A   Occupational History  . taken out of work due to back    Social History Main Topics  . Smoking status: Current Some Day Smoker    Packs/day: 1.00    Years: 43.00    Types: Cigarettes    Start date: 04/15/1968  . Smokeless tobacco: Never Used     Comment: 07-22--2016  still smoking  . Alcohol use No     Comment: 09/20/2012 "Used to; stopped ~ 2009; never had problem w/it"  . Drug use: No  . Sexual activity: No   Other Topics Concern  . Not on file   Social History Narrative   Regular exercise: rides horses   Caffeine use: occasionally    Past Surgical History:  Procedure Laterality Date  . ENTEROSCOPY N/A 03/01/2013   Procedure: ENTEROSCOPY;  Surgeon: Inda Castle, MD;  Location: [facility];  Service: Endoscopy;  Laterality: N/A;  . ENTEROSCOPY N/A 06/26/2016   Procedure: ENTEROSCOPY;  Surgeon: Milus Banister, MD;  Location: [facility];  Service: Endoscopy;  Laterality: N/A;  this is a push enteroscopy  . GIVENS CAPSULE STUDY N/A 04/05/2015   Procedure: GIVENS CAPSULE STUDY;  Surgeon: Gatha Mayer, MD;  Location: [facility];  Service: Endoscopy;  Laterality: N/A;  . INGUINAL HERNIA REPAIR Right 2011  . LEFT HEART CATHETERIZATION WITH CORONARY ANGIOGRAM N/A  09/21/2012   Procedure: LEFT HEART CATHETERIZATION WITH CORONARY ANGIOGRAM;  Surgeon: Peter M Martinique, MD;  Location: [facility] C[facility];  Service: Cardiovascular;  Laterality: N/A;  . LUMBAR DISC SURGERY  1980's  . LYMPHADENECTOMY Bilateral 01/19/2013   Procedure: LYMPHADENECTOMY;  Surgeon: Bernestine Amass, MD;  Location: [facility];  Service: Urology;  Laterality: Bilateral;  . ROBOT ASSISTED LAPAROSCOPIC RADICAL PROSTATECTOMY N/A 01/19/2013   Procedure: ROBOTIC ASSISTED LAPAROSCOPIC RADICAL PROSTATECTOMY;  Surgeon: Bernestine Amass, MD;  Location: [facility];  Service: Urology;  Laterality: N/A;  . SHOULDER OPEN ROTATOR CUFF REPAIR Left 1980's    Family History  Problem Relation Age of Onset  . Stroke Father        Died at 34  . Diabetes Mother   . Heart attack Brother  Died at 52  . Diabetes Sister   . Stomach cancer Brother   . Heart attack Sister     No Known Allergies  Current Outpatient Prescriptions on File Prior to Visit  Medication Sig Dispense Refill  . carvedilol (COREG) 3.125 MG tablet Take 1 tablet (3.125 mg total) by mouth 2 (two) times daily with a meal. 60 tablet 0  . clopidogrel (PLAVIX) 75 MG tablet Take 1 tablet (75 mg total) by mouth daily. 30 tablet 0  . gabapentin (NEURONTIN) 300 MG capsule Take 1 capsule (300 mg total) by mouth at bedtime. 30 capsule 0  . omeprazole (PRILOSEC) 40 MG capsule Take 1 capsule (40 mg total) by mouth daily. 30 capsule 3  . rosuvastatin (CRESTOR) 40 MG tablet Take 1 tablet (40 mg total) by mouth daily. 30 tablet 0   No current facility-administered medications on file prior to visit.     BP 120/60 Comment: checked twice rt arm manual reading espac.  Pulse 93   Temp 97.8 F (36.6 C) (Oral)   Resp 16   Ht 5\' 7"  (1.702 m)   Wt 128 lb (58.1 kg)   SpO2 99%   BMI 20.05 kg/m       Objective:   Physical Exam  General Mental Status- Alert. General Appearance- Not in acute distress.   Skin General: Color- Normal Color. Moisture- Normal  Moisture.  Neck Carotid Arteries- Normal color. Moisture- Normal Moisture. No carotid bruits. No JVD.  Chest and Lung Exam Auscultation: Breath Sounds:-Normal.  Cardiovascular Auscultation:Rythm- Regular. Murmurs & Other Heart Sounds:Auscultation of the heart reveals- No Murmurs.  Abdomen Inspection:-Inspeection Normal. Palpation/Percussion:Note:No mass. Palpation and Percussion of the abdomen reveal- Non Tender, Non Distended + BS, no rebound or guarding.   Neurologic Cranial Nerve exam:- CN III-XII intact(No nystagmus), symmetric smile. Strength:- 5/5 equal and symmetric strength both upper and lower extremities.  Lower ext- no pedal edema. Negative homan sign,      Assessment & Plan:  you do appear stable post hospitalization for flash pulmonary edema, hx of chf,  history of htn and ESRD.  I do want to confirm clinical finding with cxr, bnp and cmp.   I do want you to follow up with Cardiology for your chf history. I have provided you appointment info for your 08-06-2016 appointment date. I think attending this is very important.   I want you continue to check you daily weights and attend nephrologist. I won't refer you to nephrologist since you report he saw you yesterday in dialysis center and talked with you(he was aware of your hospitalization).  Follow up with me in 6-7 weeks or as needed. Possibly sooner if abnormal labs or cardiology reports indicates quicker follow up. Also I want to mention for majority of month of June I won't be in office but other providers would be available in event you need to be seen sooner.  Emalee Knies, Percell Miller, PA-C

## 2016-07-31 NOTE — Telephone Encounter (Signed)
Heart failure protein is much less than it was 3 weeks ago. Sugar is high but has been higher in past. Potassium is high. Make sure he attends dialysis tomorrow. Would you call pt and get name of his nephrologist and get number to his office. See if you can talk to nurse of his nephrologist and fax over copy of his cmp. This high k expected with someone dialysis/end stage kidney failute but I want them to be aware of today. Would they want me to start him on lactulose or just go with dialysis. Document you sent copy of lab and that pt was advised of the above. Let me know what they say.

## 2016-07-31 NOTE — Telephone Encounter (Signed)
So make sure both dialysis center and nephrologist aware of recent cmp results. Please document they are aware.

## 2016-07-31 NOTE — Telephone Encounter (Signed)
Dr. Olivia Mackie Is pt's Nephrology Pt has not been seen in office since 2017. Pt has dialysis at [facility]. Spoke with staff member at [facility] notified them of  pt's potassium levels faxed CMET to [facility] and they will contact Dr. Olivia Mackie.

## 2016-07-31 NOTE — Patient Instructions (Addendum)
You do appear stable post hospitalization for flash pulmonary edema, hx of chf,  history of htn and ESRD.  I do want to confirm clinical finding with cxr, bnp and cmp.   I do want you to follow up with Cardiology for your chf history. I have provided you appointment info for your 08-06-2016 appointment date. I think attending this is very important.   I want you continue to check you daily weights and attend nephrologist. I won't refer you to nephrologist since you report he saw you yesterday in dialysis center and talked with you(he was aware of your hospitalization).  Follow up with me in 6-7 weeks or as needed. Possibly sooner if abnormal labs or cardiology reports indicates quicker follow up. Also I want to mention for majority of month of June I won't be in office but other providers would be available in event you need to be seen sooner.

## 2016-07-31 NOTE — Telephone Encounter (Signed)
The report is in chart.

## 2016-07-31 NOTE — Telephone Encounter (Addendum)
Pt is a dialysis pt. So gfr and creatine findings are expected. But can they send the official report to [place] thorugh epic. K is elevated above upper limit of 5.1. But have then send through epic.

## 2016-07-31 NOTE — Telephone Encounter (Signed)
"  CRITICAL VALUE STICKER  CRITICAL VALUE: Potassium 6.1, Creat. 9.96, GFR 6.70  RECEIVER (on-site recipient of call):Kristy P.  DATE & TIME NOTIFIED: 07-31-16 @ 1420  MESSENGER (representative from lab):Hope  MD NOTIFIED: [place]  TIME OF NOTIFICATION:1430  RESPONSE:

## 2016-08-01 NOTE — Telephone Encounter (Signed)
Results were released into chart along with telephone note for Critical Results. [facility] always calls Critical/High results. KMP

## 2016-08-06 ENCOUNTER — Encounter: Payer: Self-pay | Admitting: Interventional Cardiology

## 2016-08-06 ENCOUNTER — Telehealth: Payer: Self-pay | Admitting: Medical

## 2016-08-06 ENCOUNTER — Telehealth: Payer: Self-pay | Admitting: Interventional Cardiology

## 2016-08-06 ENCOUNTER — Encounter: Payer: Self-pay | Admitting: Medical

## 2016-08-06 ENCOUNTER — Ambulatory Visit (INDEPENDENT_AMBULATORY_CARE_PROVIDER_SITE_OTHER): Payer: Medicare Other | Admitting: Interventional Cardiology

## 2016-08-06 VITALS — BP 94/70 | HR 81 | Ht 67.0 in | Wt 130.8 lb

## 2016-08-06 DIAGNOSIS — K922 Gastrointestinal hemorrhage, unspecified: Secondary | ICD-10-CM | POA: Diagnosis not present

## 2016-08-06 DIAGNOSIS — N186 End stage renal disease: Secondary | ICD-10-CM | POA: Diagnosis not present

## 2016-08-06 DIAGNOSIS — I209 Angina pectoris, unspecified: Secondary | ICD-10-CM | POA: Diagnosis not present

## 2016-08-06 DIAGNOSIS — I639 Cerebral infarction, unspecified: Secondary | ICD-10-CM

## 2016-08-06 DIAGNOSIS — I25119 Atherosclerotic heart disease of native coronary artery with unspecified angina pectoris: Secondary | ICD-10-CM | POA: Diagnosis not present

## 2016-08-06 DIAGNOSIS — R931 Abnormal findings on diagnostic imaging of heart and coronary circulation: Secondary | ICD-10-CM | POA: Diagnosis not present

## 2016-08-06 DIAGNOSIS — R490 Dysphonia: Secondary | ICD-10-CM

## 2016-08-06 NOTE — Telephone Encounter (Signed)
Opened to review 

## 2016-08-06 NOTE — Telephone Encounter (Signed)
I saw this that wife would appreciate a call form me. I had time to address this at 9:15 pm at night. I reviewed cardiologist note. Cardiologist appears to think that his chf is relatively well controlled. His heart function in April better than it was in the past. He mentioned he does not see need for diuretics. And even if needed this would be prohibited/difficult due to end stage kidney disease and his dialysis.   Wife mentioned he is congested. With his multiple medical problems I would need to see him in office. He might need antibiotic. Would you ask they come in for appointment. We are heading to long 3 day weekend and would like to address this before then.  He needs to be seen. I feel this is necessary and unfortunately anticipate don't have 10-15 minute in my schedule to talk over the phone. Anticipate my late day and Friday before 3 day holiday will fill up fast/

## 2016-08-06 NOTE — Telephone Encounter (Signed)
Patient's wife calling (DPR on file) and states that her husband was seen today in the office and she was wondering if his congestion due to CHF and pulmonary edema were addressed. She states that her husband is still congested and needs to be seen sooner than 6 months.

## 2016-08-06 NOTE — Patient Instructions (Signed)

## 2016-08-06 NOTE — Telephone Encounter (Signed)
Discussed wife's concerns with Dr. Irish Lack. Called and spoke with the wife regarding her concerns. Made wife aware that at the office visit today that her husband was not SOB, have any swelling, or appear to be fluid overloaded. I explained that if that were the case that he would need to be diuresed. I explained to her that since he is a dialysis patient that diuresing him would be managed by his nephrologist. I made her aware that the information from the office visit today has been forwarded to the patient's nephrologist. The wife verbalized understanding and thanked me for the call.

## 2016-08-06 NOTE — Progress Notes (Signed)
Called and spoke with the wife regarding her concerns. Made wife aware that at the office visit today that her husband was not SOB, have any swelling, or appear to be fluid overloaded. I explained that if that were the case that he would need to be diuresed. I explained to her that since he is a dialysis patient that diuresing him would be managed by his nephrologist. I made her aware that the information from the office visit today has been forwarded to the patient's nephrologist. The wife verbalized understanding and thanked me for the call.

## 2016-08-06 NOTE — Progress Notes (Addendum)
Cardiology Office Note   Date:  08/06/2016   ID:  Geraldo, Haris 07-Sep-1945, MRN [id number]  PCP:  Mackie Pai, PA-C    No chief complaint on file.  Systolic dysfunction  Wt Readings from Last 3 Encounters:  08/06/16 130 lb 12.8 oz (59.3 kg)  07/31/16 128 lb (58.1 kg)  07/26/16 129 lb 6.4 oz (58.7 kg)       History of Present Illness: KASTON FAUGHN is a 71 y.o. male  Who had a TIA in early 2018.  He has ESRD getting dialysis on M,W,Fr.  He has had prior CVA in the past.   HIs LVEF was 35-40% in 2014.  He had a left heart cath showing a 70% mid LAD lesion.  THis was managed medically due to prior GI bleeding.  Last GI bleed was about a month ago.  He did require a blood transfusion.  BP was very high on admission.   Had endoscopy in 4/18 and was cleared to start back aspirin and plavix.     H is only complaint is some hoarseness.  Denies : Chest pain. Dizziness. Leg edema. Nitroglycerin use. Orthopnea. Palpitations. Paroxysmal nocturnal dyspnea. Shortness of breath. Syncope.    Past Medical History:  Diagnosis Date  . 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 Anemia, iron deficiency 03/24/2011   a. Recurrent GI bleed, tx with periodic iron infusions.  . 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 AVM (arteriovenous malformation) of duodenum, acquired    egds in 01/2012, 04/2011  . BPH (benign prostatic hyperplasia)   . CAD (coronary artery disease)    a. Cath 09/2012: moderate borderline CAD in mid LAD/small diagonal branch, mild RCA stenosis, to be managed medically   . Chronic lower back pain   . Diabetic peripheral neuropathy ([place]) 2014   foot pain.  . Essential hypertension 02/01/2012  . Fatty liver    on CT of 11/2010  . GERD (gastroesophageal reflux disease)   . GI bleed    a. Recurrent GI bleed, tx with IV iron. b. Per heme notes - likely AVMs 04/2012 (tx with cauterization several months ago).  . Hematuria    a. Urology note scan from 07/2012: cystoscopy  without evidence for bladder lesion, only lateral hypertrophy of posterior urethra, bladder impression from BPH. b. Pt states he had "some tests" scheduled for later in July 2014.  . High cholesterol   . Hyperlipidemia 02/01/2012  . Hypertension   . Hypertensive urgency 07/06/2016  . 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 Orthostatic hypotension    a. Tx with florinef.  . Peripheral neuropathy   . Polyp, colonic    Colonoscopy 01/2012 "benign" polyp  . Prostate cancer ([place])   . Sleep apnea    "had mask; couldn't sleep in it" (09/20/2012)  . Smoker   . SOB (shortness of breath) 07/06/2016  . Stage III chronic kidney disease    a. Stage 3 (DM with complications ->CKD, peripheral neuropathy).  . Stroke ([place])    x 2  . Symptomatic anemia 04/03/2015  . Tinea pedis 05/24/2012  . Tobacco use 02/01/2012  . Type 2 diabetes, uncontrolled, with neuropathy ([place]) 02/01/2012  . Type II diabetes mellitus ([place])    a. Dx 1994, uncontrolled.     Past Surgical History:  Procedure Laterality Date  . ENTEROSCOPY N/A 03/01/2013   Procedure: ENTEROSCOPY;  Surgeon: Inda Castle, MD;  Location: [facility];  Service: Endoscopy;  Laterality: N/A;  . ENTEROSCOPY  N/A 06/26/2016   Procedure: ENTEROSCOPY;  Surgeon: Milus Banister, MD;  Location: [facility];  Service: Endoscopy;  Laterality: N/A;  this is a push enteroscopy  . GIVENS CAPSULE STUDY N/A 04/05/2015   Procedure: GIVENS CAPSULE STUDY;  Surgeon: Gatha Mayer, MD;  Location: [facility];  Service: Endoscopy;  Laterality: N/A;  . INGUINAL HERNIA REPAIR Right 2011  . LEFT HEART CATHETERIZATION WITH CORONARY ANGIOGRAM N/A 09/21/2012   Procedure: LEFT HEART CATHETERIZATION WITH CORONARY ANGIOGRAM;  Surgeon: Peter M Martinique, MD;  Location: [facility] CATH LAB;  Service: Cardiovascular;  Laterality: N/A;  . LUMBAR DISC SURGERY  1980's  . LYMPHADENECTOMY Bilateral 01/19/2013   Procedure: LYMPHADENECTOMY;  Surgeon: Bernestine Amass, MD;  [facility];  Service: Urology;  Laterality: Bilateral;  . ROBOT ASSISTED LAPAROSCOPIC RADICAL PROSTATECTOMY N/A 01/19/2013   Procedure: ROBOTIC ASSISTED LAPAROSCOPIC RADICAL PROSTATECTOMY;  Surgeon: Bernestine Amass, MD;  [facility];  Service: Urology;  Laterality: N/A;  . SHOULDER OPEN ROTATOR CUFF REPAIR Left 1980's     Current Outpatient Prescriptions  Medication Sig Dispense Refill  . carvedilol (COREG) 3.125 MG tablet Take 1 tablet (3.125 mg total) by mouth 2 (two) times daily with a meal. 60 tablet 0  . clopidogrel (PLAVIX) 75 MG tablet Take 1 tablet (75 mg total) by mouth daily. 30 tablet 0  . gabapentin (NEURONTIN) 300 MG capsule Take 1 capsule (300 mg total) by mouth at bedtime. 30 capsule 0  . omeprazole (PRILOSEC) 40 MG capsule Take 1 capsule (40 mg total) by mouth daily. 30 capsule 3  . rosuvastatin (CRESTOR) 40 MG tablet Take 1 tablet (40 mg total) by mouth daily. 30 tablet 0   No current facility-administered medications for this visit.     Allergies:   Patient has no known allergies.    Social History:  The patient  reports that he has quit smoking. His smoking use included Cigarettes. He started smoking about 48 years ago. He has a 30.00 pack-year smoking history. He has never used smokeless tobacco. He reports that he does not drink alcohol or use drugs.   Family History:  The patient's family history includes Diabetes in his mother and sister; Heart attack in his brother and sister; Stomach cancer in his brother; Stroke in his father.    ROS:  Please see the history of present illness.   Otherwise, review of systems are positive for hoarseness.   All other systems are reviewed and negative.    PHYSICAL EXAM: VS:  BP 94/70 (BP Location: Right Arm, Patient Position: Sitting, Cuff Size: Normal)   Pulse 81   Ht 5\' 7"  (1.702 m)   Wt 130 lb 12.8 oz (59.3 kg)   BMI 20.49 kg/m  , BMI Body mass index is 20.49 kg/m. GEN: Well nourished, well developed, in no acute distress    HEENT: normal  Neck: no JVD, carotid bruits, or masses Cardiac: RRR; no murmurs, rubs, or gallops,no edema  Respiratory:  clear to auscultation bilaterally, normal work of breathing GI: soft, nontender, nondistended, + BS MS: no deformity or atrophy  Skin: warm and dry, no rash Neuro:  Strength and sensation are intact Psych: euthymic mood, full affect   EKG:   The ekg ordered today demonstrates NSR, anterolateral T wave inversion- more pronounced than prior   Recent Labs: 07/25/2016: B Natriuretic Peptide 1,389.4 07/26/2016: Hemoglobin 12.4; Magnesium 1.9; Platelets 156 07/31/2016: ALT 9; BUN 34; Creatinine, Ser 9.96; Potassium 6.1; Pro B Natriuretic peptide (BNP) 343.0; Sodium  140   Lipid Panel    Component Value Date/Time   CHOL 131 06/14/2016 0358   TRIG 156 (H) 06/14/2016 0358   HDL 31 (L) 06/14/2016 0358   CHOLHDL 4.2 06/14/2016 0358   VLDL 31 06/14/2016 0358   LDLCALC 69 06/14/2016 0358   LDLDIRECT 117.0 09/25/2014 0908     Other studies Reviewed: Additional studies/ records that were reviewed today with results demonstrating: Ejection fraction shows EF 45-50%.  April echocardiogram shows:  Left ventricle: The cavity size was normal. Wall thickness was   increased in a pattern of mild LVH. Systolic function was mildly   reduced. The estimated ejection fraction was in the range of 45%   to 50%. Diffuse hypokinesis. There is hypokinesis of the   apicalinferior myocardium. Doppler parameters are consistent with   abnormal left ventricular relaxation (grade 1 diastolic   dysfunction). - Ventricular septum: Septal motion showed abnormal function and   dyssynergy (IVCD). - Mitral valve: There was mild regurgitation. - Pericardium, extracardiac: A trivial pericardial effusion was   identified.   ASSESSMENT AND PLAN:  1. CAD/abnormal ECG: No symptoms of ischemia with current medical therapy. Prior cath reports reviewed. Moderate lesion noted in the LAD in the past.  Given that he has no angina, would not pursue any invasive testing at this time. 2. ESRD: Continue with dialysis.  He appears euvolemic. 3. Hypertension: Blood pressure is been high when he comes in the hospital. Blood pressure today is well controlled. Continue carvedilol. He needs to continue to monitor his blood pressure. 4. Abnormal echocardiogram: Ejection fraction has been as low as 35% in the past. It is now slightly below normal. Given his lack of symptoms and multiple other comorbidities including multiple CVA and GI bleed, would not pursue invasive cardiac testing at this time. Don't want to add to his risk of bleeding if it is not going to help his symptoms.   Current medicines are reviewed at length with the patient today.  The patient concerns regarding his medicines were addressed.  The following changes have been made:  No change  Labs/ tests ordered today include: No orders of the defined types were placed in this encounter.   Recommend 150 minutes/week of aerobic exercise Low fat, low carb, high fiber diet recommended  Disposition:   FU in 6 months   Signed, Larae Grooms, MD  08/06/2016 1:47 PM    [facility] [address], [place],   [address] Phone: [phone]; Fax: [phone]   Addendum:   His wife called after the appointment and expressed that she feels that he is congested, and we did not address this.  She was not at the appointment.  Since he is ESRD patient, we cannot add to his diuresis.  THat would be controlled by the nephrologist at dialysis.  He appeared euvolemic.  He did not report shortness of breath today.  No leg swelling.  Systolic function is mild and he appears well compensated.   Jettie Booze, MD

## 2016-08-06 NOTE — Telephone Encounter (Signed)
New Message     Pt wife would like to speak to you about his appt today.

## 2016-08-07 NOTE — Telephone Encounter (Signed)
Spoke w/ patient's wife and scheduled appointment for tomorrow afternoon after pt's dialysis.

## 2016-08-08 ENCOUNTER — Encounter: Payer: Self-pay | Admitting: Medical

## 2016-08-08 ENCOUNTER — Ambulatory Visit (INDEPENDENT_AMBULATORY_CARE_PROVIDER_SITE_OTHER): Payer: Medicare Other | Admitting: Medical

## 2016-08-08 VITALS — BP 122/71 | HR 79 | Temp 97.7°F | Ht 67.0 in | Wt 133.4 lb

## 2016-08-08 DIAGNOSIS — R269 Unspecified abnormalities of gait and mobility: Secondary | ICD-10-CM | POA: Diagnosis not present

## 2016-08-08 DIAGNOSIS — R29898 Other symptoms and signs involving the musculoskeletal system: Secondary | ICD-10-CM | POA: Diagnosis not present

## 2016-08-08 DIAGNOSIS — Z8673 Personal history of transient ischemic attack (TIA), and cerebral infarction without residual deficits: Secondary | ICD-10-CM | POA: Diagnosis not present

## 2016-08-08 DIAGNOSIS — I639 Cerebral infarction, unspecified: Secondary | ICD-10-CM

## 2016-08-08 DIAGNOSIS — J40 Bronchitis, not specified as acute or chronic: Secondary | ICD-10-CM | POA: Diagnosis not present

## 2016-08-08 DIAGNOSIS — R05 Cough: Secondary | ICD-10-CM | POA: Diagnosis not present

## 2016-08-08 DIAGNOSIS — R0981 Nasal congestion: Secondary | ICD-10-CM

## 2016-08-08 DIAGNOSIS — R059 Cough, unspecified: Secondary | ICD-10-CM

## 2016-08-08 MED ORDER — AZITHROMYCIN 250 MG PO TABS: ORAL_TABLET | ORAL | 0 refills | Status: DC

## 2016-08-08 MED ORDER — FLUTICASONE PROPIONATE 50 MCG/ACT NA SUSP: 2.0000 | Freq: Every day | NASAL | 1 refills | Status: DC

## 2016-08-08 MED ORDER — BENZONATATE 100 MG PO CAPS: 100.0000 mg | ORAL_CAPSULE | Freq: Three times a day (TID) | ORAL | 0 refills | Status: DC | PRN

## 2016-08-08 NOTE — Progress Notes (Signed)
Subjective:    Patient ID: [name], male    DOB: 20-Jan-1946, 71 y.o.   MRN: [id number]  HPI   Pt in for some nasal congestion, and faint st x 2 weeks.. Pt states he feel like he needs to cough mucous but can't. Pt stopped smoking 2 months ago. He decided to quit when he was hospitalized. Faint mild chest congestoin. No wheezing.   No sneezing. Denies pnd.   He does have some dry cough episodes after talking.   No wheezing of shortness of breath.  Pt had cardiologist visit yesterday and chf history reviewed. On review of note cardiologist did not think diuretics indicated.  Pt pt smoked 30 years about 1/2 pack a day. One week ago cxr clear and bnp improved  Review of Systems  HENT: Positive for congestion. Negative for ear pain, postnasal drip, sinus pain, sinus pressure and sore throat.   Respiratory: Positive for cough. Negative for chest tightness, shortness of breath and wheezing.   Cardiovascular: Negative for chest pain and palpitations.  Gastrointestinal: Negative for abdominal pain.  Musculoskeletal: Negative for back pain, gait problem and myalgias.  Skin: Negative for rash.  Neurological: Negative for dizziness, seizures, syncope, speech difficulty, weakness and numbness.       Rt leg weak since stroke in pt.   Pt states he feels unstable and some near falls.   Leg still feels weak.(no acute change from his baseline)  Hematological: Negative for adenopathy. Does not bruise/bleed easily.  Psychiatric/Behavioral: Negative for behavioral problems and decreased concentration.    Past Medical History:  Diagnosis Date  . 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 Anemia, iron deficiency 03/24/2011   a. Recurrent GI bleed, tx with periodic iron infusions.  . 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 AVM (arteriovenous malformation) of duodenum, acquired    egds in 01/2012, 04/2011  . BPH (benign prostatic hyperplasia)   . CAD (coronary artery disease)    a. Cath  09/2012: moderate borderline CAD in mid LAD/small diagonal branch, mild RCA stenosis, to be managed medically   . Chronic lower back pain   . Diabetic peripheral neuropathy ([place]) 2014   foot pain.  . Essential hypertension 02/01/2012  . Fatty liver    on CT of 11/2010  . GERD (gastroesophageal reflux disease)   . GI bleed    a. Recurrent GI bleed, tx with IV iron. b. Per heme notes - likely AVMs 04/2012 (tx with cauterization several months ago).  . Hematuria    a. Urology note scan from 07/2012: cystoscopy without evidence for bladder lesion, only lateral hypertrophy of posterior urethra, bladder impression from BPH. b. Pt states he had "some tests" scheduled for later in July 2014.  . High cholesterol   . Hyperlipidemia 02/01/2012  . Hypertension   . Hypertensive urgency 07/06/2016  . 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 Orthostatic hypotension    a. Tx with florinef.  . Peripheral neuropathy   . Polyp, colonic    Colonoscopy 01/2012 "benign" polyp  . Prostate cancer ([place])   . Sleep apnea    "had mask; couldn't sleep in it" (09/20/2012)  . Smoker   . SOB (shortness of breath) 07/06/2016  . Stage III chronic kidney disease    a. Stage 3 (DM with complications ->CKD, peripheral neuropathy).  . Stroke ([place])    x 2  . Symptomatic anemia 04/03/2015  . Tinea pedis 05/24/2012  . Tobacco use 02/01/2012  . Type 2 diabetes, uncontrolled,  with neuropathy ([place]) 02/01/2012  . Type II diabetes mellitus ([place])    a. Dx 1994, uncontrolled.      Social History   Social History  . Marital status: Married    Spouse name: N/A  . Number of children: 2  . Years of education: N/A   Occupational History  . taken out of work due to back    Social History Main Topics  . Smoking status: Former Smoker    Packs/day: 1.00    Years: 30.00    Types: Cigarettes    Start date: 04/15/1968  . Smokeless tobacco: Never Used  . Alcohol use No     Comment: 09/20/2012 "Used to; stopped ~ 2009;  never had problem w/it"  . Drug use: No  . Sexual activity: No   Other Topics Concern  . Not on file   Social History Narrative   Regular exercise: rides horses   Caffeine use: occasionally    Past Surgical History:  Procedure Laterality Date  . ENTEROSCOPY N/A 03/01/2013   Procedure: ENTEROSCOPY;  Surgeon: Inda Castle, MD;  Location: [facility];  Service: Endoscopy;  Laterality: N/A;  . ENTEROSCOPY N/A 06/26/2016   Procedure: ENTEROSCOPY;  Surgeon: Milus Banister, MD;  Location: [facility];  Service: Endoscopy;  Laterality: N/A;  this is a push enteroscopy  . GIVENS CAPSULE STUDY N/A 04/05/2015   Procedure: GIVENS CAPSULE STUDY;  Surgeon: Gatha Mayer, MD;  Location: [facility];  Service: Endoscopy;  Laterality: N/A;  . INGUINAL HERNIA REPAIR Right 2011  . LEFT HEART CATHETERIZATION WITH CORONARY ANGIOGRAM N/A 09/21/2012   Procedure: LEFT HEART CATHETERIZATION WITH CORONARY ANGIOGRAM;  Surgeon: Peter M Martinique, MD;  Location: [facility] CATH LAB;  Service: Cardiovascular;  Laterality: N/A;  . LUMBAR DISC SURGERY  1980's  . LYMPHADENECTOMY Bilateral 01/19/2013   Procedure: LYMPHADENECTOMY;  Surgeon: Bernestine Amass, MD;  [facility];  Service: Urology;  Laterality: Bilateral;  . ROBOT ASSISTED LAPAROSCOPIC RADICAL PROSTATECTOMY N/A 01/19/2013   Procedure: ROBOTIC ASSISTED LAPAROSCOPIC RADICAL PROSTATECTOMY;  Surgeon: Bernestine Amass, MD;  [facility];  Service: Urology;  Laterality: N/A;  . SHOULDER OPEN ROTATOR CUFF REPAIR Left 1980's    Family History  Problem Relation Age of Onset  . Stroke Father        Died at 43  . Diabetes Mother   . Heart attack Brother        Died at 5  . Diabetes Sister   . Stomach cancer Brother   . Heart attack Sister     No Known Allergies  Current Outpatient Prescriptions on File Prior to Visit  Medication Sig Dispense Refill  . carvedilol (COREG) 3.125 MG tablet Take 1 tablet (3.125 mg total) by mouth 2 (two) times daily with a  meal. 60 tablet 0  . clopidogrel (PLAVIX) 75 MG tablet Take 1 tablet (75 mg total) by mouth daily. 30 tablet 0  . gabapentin (NEURONTIN) 300 MG capsule Take 1 capsule (300 mg total) by mouth at bedtime. 30 capsule 0  . omeprazole (PRILOSEC) 40 MG capsule Take 1 capsule (40 mg total) by mouth daily. 30 capsule 3  . rosuvastatin (CRESTOR) 40 MG tablet Take 1 tablet (40 mg total) by mouth daily. 30 tablet 0   No current facility-administered medications on file prior to visit.     BP 122/71 (BP Location: Right Arm, Patient Position: Sitting, Cuff Size: Normal)   Pulse 79   Temp 97.7 F (36.5 C) (Oral)  Ht 5\' 7"  (1.702 m)   Wt 133 lb 6.4 oz (60.5 kg)   SpO2 99%   BMI 20.89 kg/m       Objective:   Physical Exam  General  Mental Status - Alert. General Appearance - Well groomed. Not in acute distress.  Skin Rashes- No Rashes.  HEENT Head- Normal. Ear Auditory Canal - Left- Normal. Right - Normal.Tympanic Membrane- Left- Normal. Right- Normal. Eye Sclera/Conjunctiva- Left- Normal. Right- Normal. Nose & Sinuses Nasal Mucosa- Left-  Boggy and Congested. Right-  Boggy and  Congested.Bilateral maxillary and frontal sinus pressure. Mouth & Throat Lips: Upper Lip- Normal: no dryness, cracking, pallor, cyanosis, or vesicular eruption. Lower Lip-Normal: no dryness, cracking, pallor, cyanosis or vesicular eruption. Buccal Mucosa- Bilateral- No Aphthous ulcers. Oropharynx- No Discharge or Erythema. Tonsils: Characteristics- Bilateral- No Erythema or Congestion. Size/Enlargement- Bilateral- No enlargement. Discharge- bilateral-None.  Neck Neck- Supple. No Masses.   Chest and Lung Exam Auscultation: Breath Sounds:-Clear even and unlabored.  Cardiovascular Auscultation:Rythm- Regular, rate and rhythm. Murmurs & Other Heart Sounds:Ausculatation of the heart reveal- No Murmurs.  Lymphatic Head & Neck General Head & Neck Lymphatics: Bilateral: Description- No Localized  lymphadenopathy.   Neuro- CN III- XII grossly intact.  Rt lower ext- 3-4/5 strength. Gait appears unsteady. Uses cane.       Assessment & Plan:  For nasal congestion rx flonase.  For concern for bronchitis as we approach holiday weekend will rx azithromycin antibiotic.  For cough rx benzonatate  Will go ahead and refer homehealth/PT to help with balance and maybe strengthen leg.  Please weight daily. With your chf hx and renal condition we need to watch you closely. If you worsen or change over weekend severely then ED evaluation.  Follow up end of June or before June 6 th if needed. Please my chart me update on Tuesday  Mackie Pai, [place]

## 2016-08-08 NOTE — Patient Instructions (Addendum)
For nasal congestion rx flonase.  For concern for bronchitis as we approach holiday weekend will rx azithromycin antibiotic.  For cough rx benzonatate  Will go ahead and refer homehealth/PT to help with balance and maybe strengthen leg.  Please weight daily. With your chf hx and renal condition we need to watch you closely. If you worsen or change over weekend severely then ED evaluation.  Follow up end of June or before June 6 th if needed. Please my chart me update on tuesday

## 2016-08-12 ENCOUNTER — Encounter: Payer: Self-pay | Admitting: Medical

## 2016-08-14 ENCOUNTER — Telehealth: Payer: Self-pay | Admitting: Medical

## 2016-08-14 NOTE — Telephone Encounter (Signed)
Called Deneise Lever with [facility]. Per Mackie Pai, PA-C ok for PT and OT for patient.

## 2016-08-14 NOTE — Telephone Encounter (Signed)
Caller name: Deneise Lever Relationship to patient: [facility] Can be reached:  [phone] Pharmacy:  Reason for call: Request verbal order for PT 2 times a week for 6 weeks for strengthening and balance; and OT for assisting with using hands

## 2016-08-19 ENCOUNTER — Ambulatory Visit (HOSPITAL_BASED_OUTPATIENT_CLINIC_OR_DEPARTMENT_OTHER)
Admission: RE | Admit: 2016-08-19 | Discharge: 2016-08-19 | Disposition: A | Discharge status: Home / Self Care | Payer: Medicare Other | Source: Ambulatory Visit | Attending: Medical | Admitting: Medical

## 2016-08-19 ENCOUNTER — Ambulatory Visit (INDEPENDENT_AMBULATORY_CARE_PROVIDER_SITE_OTHER): Payer: Medicare Other | Admitting: Medical

## 2016-08-19 ENCOUNTER — Telehealth: Payer: Self-pay | Admitting: Emergency Medicine

## 2016-08-19 ENCOUNTER — Telehealth: Payer: Self-pay | Admitting: Medical

## 2016-08-19 ENCOUNTER — Encounter: Payer: Self-pay | Admitting: Medical

## 2016-08-19 VITALS — BP 116/71 | HR 87 | Temp 97.7°F | Resp 16 | Ht 67.0 in | Wt 138.8 lb

## 2016-08-19 DIAGNOSIS — N186 End stage renal disease: Secondary | ICD-10-CM | POA: Diagnosis not present

## 2016-08-19 DIAGNOSIS — I639 Cerebral infarction, unspecified: Secondary | ICD-10-CM

## 2016-08-19 DIAGNOSIS — I5022 Chronic systolic (congestive) heart failure: Secondary | ICD-10-CM

## 2016-08-19 LAB — COMPREHENSIVE METABOLIC PANEL
ALBUMIN: 4 g/dL (ref 3.5–5.2)
ALT: 15 U/L (ref 0–53)
AST: 22 U/L (ref 0–37)
Alkaline Phosphatase: 111 U/L (ref 39–117)
BUN: 33 mg/dL — AB (ref 6–23)
CHLORIDE: 100 meq/L (ref 96–112)
CO2: 30 mEq/L (ref 19–32)
Calcium: 8.6 mg/dL (ref 8.4–10.5)
Creatinine, Ser: 8.33 mg/dL (ref 0.40–1.50)
GFR: 8.23 mL/min — CL (ref >60.00)
Glucose, Bld: 115 mg/dL — ABNORMAL HIGH (ref 70–99)
POTASSIUM: 5.3 meq/L — AB (ref 3.5–5.1)
SODIUM: 139 meq/L (ref 135–145)
Total Bilirubin: 0.5 mg/dL (ref 0.2–1.2)
Total Protein: 7.9 g/dL (ref 6.0–8.3)

## 2016-08-19 LAB — BRAIN NATRIURETIC PEPTIDE: Pro B Natriuretic peptide (BNP): 349 pg/mL — ABNORMAL HIGH (ref 0.0–100.0)

## 2016-08-19 NOTE — Telephone Encounter (Signed)
Pt is a dialysis pt. So lab sent to me critical are always abnormal. Gfr/cr.

## 2016-08-19 NOTE — Patient Instructions (Addendum)
Your chest congestion and mucus sensation back of throat cleared after antibiotic use.  For your hx of renal failure and chf, I do want to get chest xray and labs to confirm you are stable with no pulmonary edema.(hx of flash pulmonary edema in the past)  Keep checking weights daily. If weight increases and shortness of breath develops other provider in office may be available to see you while I am out of the office. Any dramatic/severe symptoms then ED evaluation.  Continue current medication regimen.  Follow up with me in 3 weeks provided everything goes well or as needed

## 2016-08-19 NOTE — Telephone Encounter (Signed)
"  CRITICAL VALUE STICKER  CRITICAL VALUE:Creatine 8.33 & GFR 8.23  RECEIVER (on-site recipient of call): Damarys Speir P.  DATE & TIME NOTIFIED: 08-19-16 1615  MESSENGER (representative from lab):Hope  MD NOTIFIED: [place]  TIME OF NOTIFICATION:1620  RESPONSE:

## 2016-08-19 NOTE — Progress Notes (Signed)
Subjective:    Patient ID: [name], male    DOB: 1945/12/18, 71 y.o.   MRN: [id number]  HPI  Pt in for evaluation. Follow up. He denies any shortness of breath. No fever, no chills or sweats. Pt reports no swelling of his legs. No leg pain. No wheezing. Pt weight up 5 lbs per our scale. No sob on lying flat on his back/supine.  Pt has been weighing himself at home at home over past week. He state he is getting 135 and stable over pat week.  Pt states sensation of mucus in back of throat and chest congestion sensation cleared up. I had given him antibiotics. Symptom cleared since last week. No fever,no chills and no sweats.       Review of Systems  Constitutional: Negative for chills, fatigue and fever.  HENT: Negative for congestion, ear pain, sinus pain and sinus pressure.   Respiratory: Negative for cough, chest tightness, shortness of breath and wheezing.   Cardiovascular: Negative for chest pain and palpitations.  Gastrointestinal: Negative for abdominal pain.  Genitourinary: Negative for enuresis.  Musculoskeletal: Negative for back pain, myalgias and neck stiffness.  Neurological: Negative for dizziness, weakness and light-headedness.  Hematological: Negative for adenopathy. Does not bruise/bleed easily.  Psychiatric/Behavioral: Negative for behavioral problems and confusion.    Past Medical History:  Diagnosis Date  . 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 Anemia, iron deficiency 03/24/2011   a. Recurrent GI bleed, tx with periodic iron infusions.  . 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 AVM (arteriovenous malformation) of duodenum, acquired    egds in 01/2012, 04/2011  . BPH (benign prostatic hyperplasia)   . CAD (coronary artery disease)    a. Cath 09/2012: moderate borderline CAD in mid LAD/small diagonal branch, mild RCA stenosis, to be managed medically   . Chronic lower back pain   . Diabetic peripheral neuropathy ([place]) 2014   foot pain.  . Essential  hypertension 02/01/2012  . Fatty liver    on CT of 11/2010  . GERD (gastroesophageal reflux disease)   . GI bleed    a. Recurrent GI bleed, tx with IV iron. b. Per heme notes - likely AVMs 04/2012 (tx with cauterization several months ago).  . Hematuria    a. Urology note scan from 07/2012: cystoscopy without evidence for bladder lesion, only lateral hypertrophy of posterior urethra, bladder impression from BPH. b. Pt states he had "some tests" scheduled for later in July 2014.  . High cholesterol   . Hyperlipidemia 02/01/2012  . Hypertension   . Hypertensive urgency 07/06/2016  . 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 Orthostatic hypotension    a. Tx with florinef.  . Peripheral neuropathy   . Polyp, colonic    Colonoscopy 01/2012 "benign" polyp  . Prostate cancer ([place])   . Sleep apnea    "had mask; couldn't sleep in it" (09/20/2012)  . Smoker   . SOB (shortness of breath) 07/06/2016  . Stage III chronic kidney disease    a. Stage 3 (DM with complications ->CKD, peripheral neuropathy).  . Stroke ([place])    x 2  . Symptomatic anemia 04/03/2015  . Tinea pedis 05/24/2012  . Tobacco use 02/01/2012  . Type 2 diabetes, uncontrolled, with neuropathy ([place]) 02/01/2012  . Type II diabetes mellitus ([place])    a. Dx 1994, uncontrolled.      Social History   Social History  . Marital status: Married    Spouse name: N/A  .  Number of children: 2  . Years of education: N/A   Occupational History  . taken out of work due to back    Social History Main Topics  . Smoking status: Former Smoker    Packs/day: 1.00    Years: 30.00    Types: Cigarettes    Start date: 04/15/1968  . Smokeless tobacco: Never Used  . Alcohol use No     Comment: 09/20/2012 "Used to; stopped ~ 2009; never had problem w/it"  . Drug use: No  . Sexual activity: No   Other Topics Concern  . Not on file   Social History Narrative   Regular exercise: rides horses   Caffeine use: occasionally    Past  Surgical History:  Procedure Laterality Date  . ENTEROSCOPY N/A 03/01/2013   Procedure: ENTEROSCOPY;  Surgeon: Inda Castle, MD;  Location: [facility];  Service: Endoscopy;  Laterality: N/A;  . ENTEROSCOPY N/A 06/26/2016   Procedure: ENTEROSCOPY;  Surgeon: Milus Banister, MD;  Location: [facility];  Service: Endoscopy;  Laterality: N/A;  this is a push enteroscopy  . GIVENS CAPSULE STUDY N/A 04/05/2015   Procedure: GIVENS CAPSULE STUDY;  Surgeon: Gatha Mayer, MD;  Location: [facility];  Service: Endoscopy;  Laterality: N/A;  . INGUINAL HERNIA REPAIR Right 2011  . LEFT HEART CATHETERIZATION WITH CORONARY ANGIOGRAM N/A 09/21/2012   Procedure: LEFT HEART CATHETERIZATION WITH CORONARY ANGIOGRAM;  Surgeon: Peter M Martinique, MD;  [facility] C[facility];  Service: Cardiovascular;  Laterality: N/A;  . LUMBAR DISC SURGERY  1980's  . LYMPHADENECTOMY Bilateral 01/19/2013   Procedure: LYMPHADENECTOMY;  Surgeon: Bernestine Amass, MD;  Location: [facility];  Service: Urology;  Laterality: Bilateral;  . ROBOT ASSISTED LAPAROSCOPIC RADICAL PROSTATECTOMY N/A 01/19/2013   Procedure: ROBOTIC ASSISTED LAPAROSCOPIC RADICAL PROSTATECTOMY;  Surgeon: Bernestine Amass, MD;  Location: [facility];  Service: Urology;  Laterality: N/A;  . SHOULDER OPEN ROTATOR CUFF REPAIR Left 1980's    Family History  Problem Relation Age of Onset  . Stroke Father        Died at 81  . Diabetes Mother   . Heart attack Brother        Died at 69  . Diabetes Sister   . Stomach cancer Brother   . Heart attack Sister     No Known Allergies  Current Outpatient Prescriptions on File Prior to Visit  Medication Sig Dispense Refill  . benzonatate (TESSALON) 100 MG capsule Take 1 capsule (100 mg total) by mouth 3 (three) times daily as needed for cough. 21 capsule 0  . carvedilol (COREG) 3.125 MG tablet Take 1 tablet (3.125 mg total) by mouth 2 (two) times daily with a meal. 60 tablet 0  . clopidogrel (PLAVIX) 75 MG tablet Take 1 tablet (75 mg  total) by mouth daily. 30 tablet 0  . fluticasone (FLONASE) 50 MCG/ACT nasal spray Place 2 sprays into both nostrils daily. 16 g 1  . gabapentin (NEURONTIN) 300 MG capsule Take 1 capsule (300 mg total) by mouth at bedtime. 30 capsule 0  . omeprazole (PRILOSEC) 40 MG capsule Take 1 capsule (40 mg total) by mouth daily. 30 capsule 3  . rosuvastatin (CRESTOR) 40 MG tablet Take 1 tablet (40 mg total) by mouth daily. 30 tablet 0   No current facility-administered medications on file prior to visit.     BP 116/71 (BP Location: Right Arm, Patient Position: Sitting, Cuff Size: Normal)   Pulse 87   Temp 97.7 F (36.5 C) (Oral)  Resp 16   Ht 5\' 7"  (1.702 m)   Wt 138 lb 12.8 oz (63 kg)   SpO2 100%   BMI 21.74 kg/m       Objective:   Physical Exam   General Mental Status- Alert. General Appearance- Not in acute distress.   Skin General: Color- Normal Color. Moisture- Normal Moisture.  Neck Carotid Arteries- Normal color. Moisture- Normal Moisture. No carotid bruits. No JVD.  Chest and Lung Exam Auscultation: Breath Sounds:-Normal. CTA. Lying supine on dyspnea.  Cardiovascular Auscultation:Rythm- Regular. Murmurs & Other Heart Sounds:Auscultation of the heart reveals- No Murmurs.  Abdomen Inspection:-Inspeection Normal. Palpation/Percussion:Note:No mass. Palpation and Percussion of the abdomen reveal- Non Tender, Non Distended + BS, no rebound or guarding.  Neurologic Cranial Nerve exam:- CN III-XII intact(No nystagmus), symmetric smile. Strength:- 5/5 equal and symmetric strength both upper and lower extremities.  Lower ext- no pedal edema.     Assessment & Plan:  Your chest congestion and mucus sensation back of throat cleared after antibiotic use.  For your hx of renal failure and chf, I do want to get chest xray and labs to confirm you are stable with no pulmonary edema.  Keep checking weights daily. If weight increases and shortness of breath develops other  provider in office may be available to see you while I am out of the office. Any dramatic/severe symptoms then ED evaluation.  Continue current medication regimen.  Follow up with me in 3 weeks provided everything goes well or as needed  Contessa Preuss, Percell Miller, [employer]

## 2016-08-21 ENCOUNTER — Encounter: Payer: Self-pay | Admitting: Neurology

## 2016-08-21 ENCOUNTER — Telehealth: Payer: Self-pay | Admitting: Medical

## 2016-08-21 ENCOUNTER — Encounter: Payer: Self-pay | Admitting: Medical

## 2016-08-21 ENCOUNTER — Ambulatory Visit (INDEPENDENT_AMBULATORY_CARE_PROVIDER_SITE_OTHER): Payer: Medicare Other | Admitting: Neurology

## 2016-08-21 VITALS — BP 94/61 | HR 79 | Ht 67.0 in | Wt 132.8 lb

## 2016-08-21 DIAGNOSIS — I639 Cerebral infarction, unspecified: Secondary | ICD-10-CM | POA: Diagnosis not present

## 2016-08-21 DIAGNOSIS — I6381 Other cerebral infarction due to occlusion or stenosis of small artery: Secondary | ICD-10-CM

## 2016-08-21 NOTE — Telephone Encounter (Addendum)
Caller name: Truddie Coco  Relation to pt: PT from [place]  Call back number: [phone] Pharmacy:  Reason for call:  Missed 1 PT appointment and will retry tomorrow

## 2016-08-21 NOTE — Patient Instructions (Signed)
I had a long d/w patient about his recent silent lacunar stroke,chronic gait difficulty risk for recurrent stroke/TIAs, personally independently reviewed imaging studies and stroke evaluation results and answered questions.Continue Plavix  for secondary stroke prevention and maintain strict control of hypertension with blood pressure goal below 130/90, diabetes with hemoglobin A1c goal below 6.5% and lipids with LDL cholesterol goal below 70 mg/dL. I also advised the patient to eat a healthy diet with plenty of whole grains, cereals, fruits and vegetables, exercise regularly and maintain ideal body weight. Followup in the future with my nurse practitioner in  6 months or call earlier if necessary  Stroke Prevention Some medical conditions and behaviors are associated with an increased chance of having a stroke. You may prevent a stroke by making healthy choices and managing medical conditions. How can I reduce my risk of having a stroke?  Stay physically active. Get at least 30 minutes of activity on most or all days.  Do not smoke. It may also be helpful to avoid exposure to secondhand smoke.  Limit alcohol use. Moderate alcohol use is considered to be: ? No more than 2 drinks per day for men. ? No more than 1 drink per day for nonpregnant women.  Eat healthy foods. This involves: ? Eating 5 or more servings of fruits and vegetables a day. ? Making dietary changes that address high blood pressure (hypertension), high cholesterol, diabetes, or obesity.  Manage your cholesterol levels. ? Making food choices that are high in fiber and low in saturated fat, trans fat, and cholesterol may control cholesterol levels. ? Take any prescribed medicines to control cholesterol as directed by your health care provider.  Manage your diabetes. ? Controlling your carbohydrate and sugar intake is recommended to manage diabetes. ? Take any prescribed medicines to control diabetes as directed by your health  care provider.  Control your hypertension. ? Making food choices that are low in salt (sodium), saturated fat, trans fat, and cholesterol is recommended to manage hypertension. ? Ask your health care provider if you need treatment to lower your blood pressure. Take any prescribed medicines to control hypertension as directed by your health care provider. ? If you are 64-56 years of age, have your blood pressure checked every 3-5 years. If you are 15 years of age or older, have your blood pressure checked every year.  Maintain a healthy weight. ? Reducing calorie intake and making food choices that are low in sodium, saturated fat, trans fat, and cholesterol are recommended to manage weight.  Stop drug abuse.  Avoid taking birth control pills. ? Talk to your health care provider about the risks of taking birth control pills if you are over 67 years old, smoke, get migraines, or have ever had a blood clot.  Get evaluated for sleep disorders (sleep apnea). ? Talk to your health care provider about getting a sleep evaluation if you snore a lot or have excessive sleepiness.  Take medicines only as directed by your health care provider. ? For some people, aspirin or blood thinners (anticoagulants) are helpful in reducing the risk of forming abnormal blood clots that can lead to stroke. If you have the irregular heart rhythm of atrial fibrillation, you should be on a blood thinner unless there is a good reason you cannot take them. ? Understand all your medicine instructions.  Make sure that other conditions (such as anemia or atherosclerosis) are addressed. Get help right away if:  You have sudden weakness or numbness of the  face, arm, or leg, especially on one side of the body.  Your face or eyelid droops to one side.  You have sudden confusion.  You have trouble speaking (aphasia) or understanding.  You have sudden trouble seeing in one or both eyes.  You have sudden trouble  walking.  You have dizziness.  You have a loss of balance or coordination.  You have a sudden, severe headache with no known cause.  You have new chest pain or an irregular heartbeat. Any of these symptoms may represent a serious problem that is an emergency. Do not wait to see if the symptoms will go away. Get medical help at once. Call your local emergency services (911 in [place] Do not drive yourself to the hospital. This information is not intended to replace advice given to you by your health care provider. Make sure you discuss any questions you have with your health care provider. Document Released: 04/10/2004 Document Revised: 08/09/2015 Document Reviewed: 09/03/2012 Elsevier Interactive Patient Education  2017 [employer].

## 2016-08-21 NOTE — Progress Notes (Signed)
[facility] [address] [place]. [place] [address] [phone]       OFFICE FOLLOW-UP NOTE  [name] Date of Birth:  [birth year] Medical Record Number:  [id number]   HPI: 71 year African-American male seen today for first office follow-up visit following hospital admission for lacunar infarct in March 2018. History is obtained from the patient, review of electronic medical records and have personally reviewed imaging films. [name] is a 71 y.o. male he reports that his right leg has not been working very well for the past 2 weeks. He also describes that it feels slightly numb, but I'm not sure if he means numb or weak because he describes numbness as "it is not working right." Initially a code stroke was called because he is a very poor historian and when asked when he was last normal, he initially reports 6 AM. I then ask him when things started and he again reports 6 AM. I then asked when it was last completely normal, and he reported 2 weeks ago. He has been having progressive difficulty with walking since then.He was seen at dialysis were hemoglobin was measured and was found to before. 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 tpa given?: no, out of window. CT scan of the head showed no acute abnormality. MRI showed punctate acute infarcts along the lateral margin of the right thalamus. MRA of the brain showed no large vessel stenosis. Thoracic echo showed diminished ejection fraction of 45-50% with diffuse hypokinesis.. Carotid ultrasound showed no significant except been stenosis. LDL cholesterol 69 mg percent. Hemoglobin A1c was 4.9. He was seen by physical occupational therapy and started on Plavix for stroke prevention. He states his done well since discharge. His right leg continues to be weak. Did have a previous stroke in 2017 which left him with residual  right-sided weakness on that she has not fully recovered. ROS:   14 system review of systems is positive for weight loss, hearing loss, joint pain, numbness, weakness, snoring and all other systems negative PMH:  Past Medical History:  Diagnosis Date  . 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 Anemia, iron deficiency 03/24/2011   a. Recurrent GI bleed, tx with periodic iron infusions.  . 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 AVM (arteriovenous malformation) of duodenum, acquired    egds in 01/2012, 04/2011  . BPH (benign prostatic hyperplasia)   . CAD (coronary artery disease)    a. Cath 09/2012: moderate borderline CAD in mid LAD/small diagonal branch, mild RCA stenosis, to be managed medically   . Chronic lower back pain   . Diabetic peripheral neuropathy ([place]) 2014   foot pain.  . Essential hypertension 02/01/2012  . Fatty liver    on CT of 11/2010  . GERD (gastroesophageal reflux disease)   . GI bleed    a. Recurrent GI bleed, tx with IV iron. b. Per heme notes - likely AVMs 04/2012 (tx with cauterization several months ago).  . Hematuria    a. Urology note scan from 07/2012: cystoscopy without evidence for bladder lesion, only lateral hypertrophy of posterior urethra, bladder impression from BPH. b. Pt states he had "some tests" scheduled for later in July 2014.  . High cholesterol   . Hyperlipidemia 02/01/2012  . Hypertension   . Hypertensive urgency 07/06/2016  . 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 Orthostatic hypotension  a. Tx with florinef.  . Peripheral neuropathy   . Polyp, colonic    Colonoscopy 01/2012 "benign" polyp  . Prostate cancer ([place])   . Sleep apnea    "had mask; couldn't sleep in it" (09/20/2012)  . Smoker   . SOB (shortness of breath) 07/06/2016  . Stage III chronic kidney disease    a. Stage 3 (DM with complications ->CKD, peripheral neuropathy).  . Stroke ([place])    x 2  . Symptomatic anemia 04/03/2015  . Tinea pedis 05/24/2012  .  Tobacco use 02/01/2012  . Type 2 diabetes, uncontrolled, with neuropathy ([place]) 02/01/2012  . Type II diabetes mellitus ([place])    a. Dx 1994, uncontrolled.     Social History:  Social History   Social History  . Marital status: Married    Spouse name: N/A  . Number of children: 2  . Years of education: N/A   Occupational History  . taken out of work due to back    Social History Main Topics  . Smoking status: Former Smoker    Packs/day: 1.00    Years: 30.00    Types: Cigarettes    Start date: 04/15/1968  . Smokeless tobacco: Never Used  . Alcohol use No     Comment: 09/20/2012 "Used to; stopped ~ 2009; never had problem w/it"  . Drug use: No  . Sexual activity: No   Other Topics Concern  . Not on file   Social History Narrative   Regular exercise: rides horses   Caffeine use: occasionally    Medications:   Current Outpatient Prescriptions on File Prior to Visit  Medication Sig Dispense Refill  . benzonatate (TESSALON) 100 MG capsule Take 1 capsule (100 mg total) by mouth 3 (three) times daily as needed for cough. 21 capsule 0  . carvedilol (COREG) 3.125 MG tablet Take 1 tablet (3.125 mg total) by mouth 2 (two) times daily with a meal. 60 tablet 0  . clopidogrel (PLAVIX) 75 MG tablet Take 1 tablet (75 mg total) by mouth daily. 30 tablet 0  . fluticasone (FLONASE) 50 MCG/ACT nasal spray Place 2 sprays into both nostrils daily. 16 g 1  . gabapentin (NEURONTIN) 300 MG capsule Take 1 capsule (300 mg total) by mouth at bedtime. 30 capsule 0  . omeprazole (PRILOSEC) 40 MG capsule Take 1 capsule (40 mg total) by mouth daily. 30 capsule 3  . rosuvastatin (CRESTOR) 40 MG tablet Take 1 tablet (40 mg total) by mouth daily. 30 tablet 0   No current facility-administered medications on file prior to visit.     Allergies:  No Known Allergies  Physical Exam General: Cachectic frail middle-aged African-American male, seated, in no evident distress Head: head normocephalic and  atraumatic.  Neck: supple with no carotid or supraclavicular bruits Cardiovascular: regular rate and rhythm, no murmurs Musculoskeletal: no deformity Skin:  no rash/petichiae Vascular:  Normal pulses all extremities Vitals:   08/21/16 1417  BP: 94/61  Pulse: 79   Neurologic Exam Mental Status: Awake and fully alert. Oriented to place and time. Recent and remote memory intact. Attention span, concentration and fund of knowledge appropriate. Mood and affect appropriate.  Cranial Nerves: Fundoscopic exam reveals sharp disc margins. Pupils equal, briskly reactive to light. Extraocular movements full without nystagmus. Visual fields full to confrontation. Hearing intact. Facial sensation intact. Face, tongue, palate moves normally and symmetrically.  Motor: Normal bulk and tone. Normal strength in all tested extremity muscles Except mild weakness of right hip legs is an  ankle dorsiflexors. Tone slightly increased in the right leg compared to the left. Fine finger movements are diminished on the right. Orbits left over right upper extremity.. Sensory.: intact to touch ,pinprick .position and vibratory sensation.  Coordination: Rapid alternating movements normal in all extremities. Finger-to-nose and heel-to-shin performed accurately bilaterally. Gait and Station: Arises from chair without difficulty. Stance is normal. Gait demonstrates slight dragging of the right leg. Uses a cane. . unable to heel, toe and tandem walk without difficulty.  Reflexes: 1+ and symmetric. Toes downgoing.   NIHSS 1 Modified Rankin  2   ASSESSMENT: 21 year With chronic right leg weakness following remote left brain subcortical infarct from lacunar disease. Near silent right thalamic lacunar infarct in March 2018 without corresponding clinical symptoms. Multiple vascular risk factors of hypertension, diabetes, coronary artery disease hyper lipidemia and cerebrovascular disease    PLAN: I had a long d/w patient about  his recent silent lacunar stroke,chronic gait difficulty risk for recurrent stroke/TIAs, personally independently reviewed imaging studies and stroke evaluation results and answered questions.Continue Plavix  for secondary stroke prevention and maintain strict control of hypertension with blood pressure goal below 130/90, diabetes with hemoglobin A1c goal below 6.5% and lipids with LDL cholesterol goal below 70 mg/dL. I also advised the patient to eat a healthy diet with plenty of whole grains, cereals, fruits and vegetables, exercise regularly and maintain ideal body weight. Followup in the future with my nurse practitioner in  6 months or call earlier if necessary Greater than 50% of time during this 25 minute visit was spent on counseling,explanation of diagnosis, planning of further management, discussion with patient and family and coordination of care Antony Contras, MD  [facility] [address] [place], [place] [address]  Phone [phone] Fax [phone] Note: This document was prepared with digital dictation and possible smart phrase technology. Any transcriptional errors that result from this process are unintentional

## 2016-08-28 ENCOUNTER — Telehealth: Payer: Self-pay

## 2016-08-28 NOTE — Telephone Encounter (Signed)
From completed by Dr. Leonie Man on 08/27/2016 before his vacation. Form dates for hospitalization can only be done from 06/13/2016 to 06/15/2016. These are the dates for when Dr. Leonie Man saw pt for stroke. Pt was in the hospital this year also for other medical complications that were not neurological issues.Form given to Hilda Blades in medical records. Payment is due for 50.00 dollars.

## 2016-09-01 ENCOUNTER — Telehealth: Payer: Self-pay | Admitting: Medical

## 2016-09-01 NOTE — Telephone Encounter (Signed)
Called and spoke with [name] with [facility] and gave him verbal orders per Dr. Nani Ravens.  To change PT to 1 time a week for 2 weeks and 2 times a week for 5 weeks and then 1 time a week for 1 week.  Beginning May 31.//AB/CMA

## 2016-09-01 NOTE — Telephone Encounter (Signed)
Caller name: Shaun Relationship to patient:  Radene Journey Can be reached: [phone] Pharmacy:  Reason for call: Need verbal order to change PT to 1 time a week for 2 weeks and 2 times a week for 5 weeks and then 1 time a week for 1 week. Beginning May 31. May leave VM

## 2016-09-01 NOTE — Telephone Encounter (Signed)
OK 

## 2016-09-01 NOTE — Telephone Encounter (Signed)
Please advise.//AB/CMA 

## 2016-09-06 ENCOUNTER — Other Ambulatory Visit: Payer: Self-pay | Admitting: Medical

## 2016-09-08 ENCOUNTER — Other Ambulatory Visit: Payer: Self-pay | Admitting: Medical

## 2016-09-10 ENCOUNTER — Telehealth: Payer: Self-pay | Admitting: *Deleted

## 2016-09-10 NOTE — Telephone Encounter (Signed)
Received Home Health Certification and Plan of Care; forwarded to provider/SLS 06/27

## 2016-09-11 ENCOUNTER — Telehealth: Payer: Self-pay | Admitting: *Deleted

## 2016-09-11 ENCOUNTER — Ambulatory Visit: Payer: Medicare Other | Admitting: Medical

## 2016-09-11 NOTE — Telephone Encounter (Signed)
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

## 2016-09-12 ENCOUNTER — Other Ambulatory Visit: Payer: Self-pay

## 2016-09-12 MED ORDER — CARVEDILOL 3.125 MG PO TABS: 3.1250 mg | ORAL_TABLET | Freq: Two times a day (BID) | ORAL | 0 refills | Status: DC

## 2016-09-18 ENCOUNTER — Emergency Department (HOSPITAL_BASED_OUTPATIENT_CLINIC_OR_DEPARTMENT_OTHER)
Admission: EM | Admit: 2016-09-18 | Discharge: 2016-09-18 | Disposition: A | Discharge status: Home / Self Care | Payer: Medicare Other | Attending: Emergency Medicine | Admitting: Emergency Medicine

## 2016-09-18 ENCOUNTER — Encounter (HOSPITAL_BASED_OUTPATIENT_CLINIC_OR_DEPARTMENT_OTHER): Payer: Self-pay | Admitting: Emergency Medicine

## 2016-09-18 DIAGNOSIS — H1045 Other chronic allergic conjunctivitis: Secondary | ICD-10-CM | POA: Insufficient documentation

## 2016-09-18 DIAGNOSIS — Z8546 Personal history of malignant neoplasm of prostate: Secondary | ICD-10-CM | POA: Diagnosis not present

## 2016-09-18 DIAGNOSIS — I129 Hypertensive chronic kidney disease with stage 1 through stage 4 chronic kidney disease, or unspecified chronic kidney disease: Secondary | ICD-10-CM | POA: Diagnosis not present

## 2016-09-18 DIAGNOSIS — H1013 Acute atopic conjunctivitis, bilateral: Secondary | ICD-10-CM

## 2016-09-18 DIAGNOSIS — Z7902 Long term (current) use of antithrombotics/antiplatelets: Secondary | ICD-10-CM | POA: Diagnosis not present

## 2016-09-18 DIAGNOSIS — E1122 Type 2 diabetes mellitus with diabetic chronic kidney disease: Secondary | ICD-10-CM | POA: Diagnosis not present

## 2016-09-18 DIAGNOSIS — H10023 Other mucopurulent conjunctivitis, bilateral: Secondary | ICD-10-CM | POA: Diagnosis present

## 2016-09-18 DIAGNOSIS — I2510 Atherosclerotic heart disease of native coronary artery without angina pectoris: Secondary | ICD-10-CM | POA: Diagnosis not present

## 2016-09-18 DIAGNOSIS — E1140 Type 2 diabetes mellitus with diabetic neuropathy, unspecified: Secondary | ICD-10-CM | POA: Insufficient documentation

## 2016-09-18 DIAGNOSIS — Z87891 Personal history of nicotine dependence: Secondary | ICD-10-CM | POA: Diagnosis not present

## 2016-09-18 DIAGNOSIS — Z79899 Other long term (current) drug therapy: Secondary | ICD-10-CM | POA: Diagnosis not present

## 2016-09-18 DIAGNOSIS — N183 Chronic kidney disease, stage 3 (moderate): Secondary | ICD-10-CM | POA: Diagnosis not present

## 2016-09-18 MED ORDER — TETRACAINE HCL 0.5 % OP SOLN
2.0000 [drp] | Freq: Once | OPHTHALMIC | Status: DC
  Filled 2016-09-18: qty 4

## 2016-09-18 MED ORDER — FLUORESCEIN SODIUM 0.6 MG OP STRP
1.0000 | ORAL_STRIP | Freq: Once | OPHTHALMIC | Status: DC
  Filled 2016-09-18: qty 1

## 2016-09-18 MED ORDER — CETIRIZINE HCL 10 MG PO TABS
10.0000 mg | ORAL_TABLET | Freq: Every day | ORAL | 0 refills | Status: DC
Start: 2016-09-18 — End: 2016-12-22

## 2016-09-18 NOTE — Discharge Instructions (Signed)
As discussed, continue using Flonase nasal spray and eyedrops. You can put your eyedrops in the refrigerator. Try cool wet compresses on your eyes for symptomatic relief. Try not to rub your eyes. The medicine prescribed is an antihistamine which will help with allergies.  Follow-up with your primary care provider as needed. Return if symptoms worsen, you experience visual changes, pain in your eyes, or other new concerning symptoms in the meantime.

## 2016-09-18 NOTE — ED Provider Notes (Signed)
[facility] Provider Note   CSN: [id number] Arrival date & time: 09/18/16  1043     History   Chief Complaint Chief Complaint  Patient presents with  . Eye Problem    HPI [name] is a 71 y.o. male presenting with bilateral eye burning and clear discharge. Reports a history of working with hay and allergies. Patient currently uses Flonase. No pain or visual disturbances, denies headache, fever, chills, purulent discharge.  HPI  Past Medical History:  Diagnosis Date  . 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 Anemia, iron deficiency 03/24/2011   a. Recurrent GI bleed, tx with periodic iron infusions.  . 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 AVM (arteriovenous malformation) of duodenum, acquired    egds in 01/2012, 04/2011  . BPH (benign prostatic hyperplasia)   . CAD (coronary artery disease)    a. Cath 09/2012: moderate borderline CAD in mid LAD/small diagonal branch, mild RCA stenosis, to be managed medically   . Chronic lower back pain   . Diabetic peripheral neuropathy ([place]) 2014   foot pain.  . Essential hypertension 02/01/2012  . Fatty liver    on CT of 11/2010  . GERD (gastroesophageal reflux disease)   . GI bleed    a. Recurrent GI bleed, tx with IV iron. b. Per heme notes - likely AVMs 04/2012 (tx with cauterization several months ago).  . Hematuria    a. Urology note scan from 07/2012: cystoscopy without evidence for bladder lesion, only lateral hypertrophy of posterior urethra, bladder impression from BPH. b. Pt states he had "some tests" scheduled for later in July 2014.  . High cholesterol   . Hyperlipidemia 02/01/2012  . Hypertension   . Hypertensive urgency 07/06/2016  . 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 Orthostatic hypotension    a. Tx with florinef.  . Peripheral neuropathy   . Polyp, colonic    Colonoscopy 01/2012 "benign" polyp  . Prostate cancer ([place])   . Sleep apnea    "had mask; couldn't sleep in it"  (09/20/2012)  . Smoker   . SOB (shortness of breath) 07/06/2016  . Stage III chronic kidney disease    a. Stage 3 (DM with complications ->CKD, peripheral neuropathy).  . Stroke ([place])    x 2  . Symptomatic anemia 04/03/2015  . Tinea pedis 05/24/2012  . Tobacco use 02/01/2012  . Type 2 diabetes, uncontrolled, with neuropathy ([place]) 02/01/2012  . Type II diabetes mellitus ([place])    a. Dx 1994, uncontrolled.     Patient Active Problem List   Diagnosis Date Noted  . Abnormal echocardiogram 08/06/2016  . Flash pulmonary edema ([place]) 07/06/2016  . SOB (shortness of breath) 07/06/2016  . Hypertensive urgency 07/06/2016  . Anemia in chronic kidney disease, on chronic dialysis ()   . Anemia due to chronic kidney disease   . Diabetic retinopathy ([place]) 01/16/2016  . Dysphagia 12/21/2015  . Environmental allergies 12/04/2015  . Allergic rhinitis 10/28/2015  . ESRD (end stage renal disease) ([place]) 10/28/2015  . Stroke ([place]) 04/06/2015  . AVM (arteriovenous malformation) of small bowel, acquired with hemorrhage ([place]) 04/06/2015  . Malnutrition of moderate degree 04/05/2015  . Acute renal failure superimposed on stage 4 chronic kidney disease ([place]) 04/04/2015  . Symptomatic anemia 04/03/2015  . Benign paroxysmal positional vertigo 09/26/2014  . Weakness of right lower extremity 02/28/2014  . Metatarsalgia of both feet 09/13/2013  . Equinus deformity of foot, acquired 09/13/2013  . Diabetes mellitus type 2, controlled, with  complications ([place]) 62/95/2841  . GERD (gastroesophageal reflux disease) 03/24/2013  . GI bleed 02/28/2013  . Cerebral infarction ([place]) 11/17/2012  . Dizziness and giddiness 10/31/2012  . Prostate cancer ([place]) 10/27/2012  . Diabetic neuropathy ([place]) 10/04/2012  . CAD (coronary artery disease) 10/04/2012  . Hoarseness 06/26/2012  . Hematuria 05/24/2012  . Tinea pedis 05/24/2012  . Back pain 02/23/2012  . Erectile dysfunction 02/03/2012  . Type 2 diabetes, uncontrolled,  with neuropathy ([place]) 02/01/2012  . Essential hypertension 02/01/2012  . Tobacco use 02/01/2012  . Hyperlipidemia 02/01/2012  . Anemia of renal disease 05/14/2011    Past Surgical History:  Procedure Laterality Date  . ENTEROSCOPY N/A 03/01/2013   Procedure: ENTEROSCOPY;  Surgeon: Inda [place], MD;  Location: [facility];  Service: Endoscopy;  Laterality: N/A;  . ENTEROSCOPY N/A 06/26/2016   Procedure: ENTEROSCOPY;  Surgeon: Milus Banister, MD;  Location: [facility];  Service: Endoscopy;  Laterality: N/A;  this is a push enteroscopy  . GIVENS CAPSULE STUDY N/A 04/05/2015   Procedure: GIVENS CAPSULE STUDY;  Surgeon: Gatha [facility], MD;  Location: [facility];  Service: Endoscopy;  Laterality: N/A;  . INGUINAL HERNIA REPAIR Right 2011  . LEFT HEART CATHETERIZATION WITH CORONARY ANGIOGRAM N/A 09/21/2012   Procedure: LEFT HEART CATHETERIZATION WITH CORONARY ANGIOGRAM;  Surgeon: Peter M Martinique, MD;  Location: [facility] CATH LAB;  Service: Cardiovascular;  Laterality: N/A;  . LUMBAR DISC SURGERY  1980's  . LYMPHADENECTOMY Bilateral 01/19/2013   Procedure: LYMPHADENECTOMY;  Surgeon: Bernestine Amass, MD;  Location: [facility];  Service: Urology;  Laterality: Bilateral;  . ROBOT ASSISTED LAPAROSCOPIC RADICAL PROSTATECTOMY N/A 01/19/2013   Procedure: ROBOTIC ASSISTED LAPAROSCOPIC RADICAL PROSTATECTOMY;  Surgeon: Bernestine [facility], MD;  Location: [facility];  Service: Urology;  Laterality: N/A;  . SHOULDER OPEN ROTATOR CUFF REPAIR Left 1980's       Home Medications    Prior to Admission medications   Medication Sig Start Date End Date Taking? Authorizing Provider  benzonatate (TESSALON) 100 MG capsule Take 1 capsule (100 mg total) by mouth 3 (three) times daily as needed for cough. 08/08/16   Saguier, Percell [place], PA-C  carvedilol (COREG) 3.125 MG tablet Take 1 tablet (3.125 mg total) by mouth 2 (two) times daily with a meal. 09/12/16   Saguier, Percell [place], PA-C  cetirizine (ZYRTEC ALLERGY) 10 MG tablet Take 1 tablet  (10 mg total) by mouth daily. 09/18/16   Avie Echevaria B, PA-C  clopidogrel (PLAVIX) 75 MG tablet TAKE 1 TABLET BY MOUTH EVERY DAY 09/10/16   Saguier, Percell [place], PA-C  fluticasone [facility]) 50 MCG/ACT nasal spray Place 2 sprays into both nostrils daily. 08/08/16   Saguier, Percell Miller, PA-C  gabapentin (NEURONTIN) 300 MG capsule Take 1 capsule (300 mg total) by mouth at bedtime. 07/07/16   Domenic Polite, MD  omeprazole (PRILOSEC) 40 MG capsule Take 1 capsule (40 mg total) by mouth daily. 06/19/16   Saguier, Percell Miller, PA-C  rosuvastatin (CRESTOR) 40 MG tablet Take 1 tablet (40 mg total) by mouth daily. 06/19/16   Saguier, Percell [place], PA-C    Family History Family History  Problem Relation Age of Onset  . Stroke Father        Died at 56  . Diabetes Mother   . Heart attack Brother        Died at 79  . Diabetes Sister   . Stomach cancer Brother   . Heart attack Sister     Social History Social History  Substance Use Topics  . Smoking status:  Former Smoker    Packs/day: 1.00    Years: 30.00    Types: Cigarettes    Start date: 04/15/1968  . Smokeless tobacco: Never Used  . Alcohol use No     Comment: 09/20/2012 "Used to; stopped ~ 2009; never had problem w/it"     Allergies   Patient has no known allergies.   Review of Systems Review of Systems  Constitutional: Negative for chills and fever.  HENT: Positive for congestion, postnasal drip and sinus pressure. Negative for ear pain, facial swelling and sore throat.   Eyes: Positive for discharge and itching. Negative for photophobia, pain, redness and visual disturbance.  Respiratory: Negative for cough, shortness of breath, wheezing and stridor.   Cardiovascular: Negative for chest pain and palpitations.  Gastrointestinal: Negative for abdominal pain, nausea and vomiting.  Genitourinary: Negative for dysuria and hematuria.  Musculoskeletal: Negative for arthralgias, back pain, neck pain and neck stiffness.  Skin: Negative for color change,  pallor and rash.  Neurological: Negative for seizures and syncope.     Physical Exam Updated Vital Signs BP 129/86 (BP Location: Right Arm)   Pulse 87   Temp 98.6 F (37 C) (Oral)   Resp 18   Ht 5\' 7"  (1.702 m)   Wt 60.8 kg (134 lb)   SpO2 100%   BMI 20.99 kg/m   Physical Exam  Constitutional: He appears well-developed and well-nourished. No distress.  Patient is afebrile, nontoxic-appearing, sitting comfortably in bed in no acute distress.  HENT:  Head: Normocephalic and atraumatic.  Mouth/Throat: Oropharynx is clear and moist. No oropharyngeal exudate.  Eyes: Conjunctivae and EOM are normal. Pupils are equal, round, and reactive to light. Right eye exhibits discharge. Left eye exhibits discharge. No scleral icterus.  No pain with extraocular motion. Clear discharge bilaterally. Mild erythema. No periorbital edema, warmth or erythema. No pain with EOM.  Lids, lashes, lacrimals without lesions.  Neck: Normal range of motion. Neck supple.  Cardiovascular: Normal rate, regular rhythm and normal heart sounds.   No murmur heard. Pulmonary/Chest: Effort normal and breath sounds normal. No respiratory distress. He has no wheezes. He has no rales.  Abdominal: He exhibits no distension.  Musculoskeletal: Normal range of motion. He exhibits no edema.  Neurological: He is alert.  Skin: Skin is warm. No rash noted. He is not diaphoretic. No erythema. No pallor.  Psychiatric: He has a normal mood and affect.  Nursing note and vitals reviewed.    ED Treatments / Results  Labs (all labs ordered are listed, but only abnormal results are displayed) Labs Reviewed - No data to display  EKG  EKG Interpretation None       Radiology No results found.  Procedures Procedures (including critical care time)  Medications Ordered in ED Medications - No data to display   Initial Impression / Assessment and Plan / ED Course  I have reviewed the triage vital signs and the nursing  notes.  Pertinent labs & imaging results that were available during my care of the patient were reviewed by me and considered in my medical decision making (see chart for details).    Patient presents with bilateral ocular pruritus and burning sensation with clear discharge. Patient's symptoms are consistent with allergic conjunctivitis. Exam otherwise unremarkable, patient is well-appearing and nontoxic. No visual disturbances, no eye pain with extraocular motion, pupils are round and reactive. No concern for glaucoma or uveitis. Denies coming in contact with any chemicals or other agents.  Advised antihistamines, cool wet compress,  cool eyedrops, continued Flonase for allergy symptoms. Patient was educated on proper use of nasal sprays.  Discharge home with PCP follow-up as needed. Discussed strict return precautions and advised to return to the emergency department if experiencing any new or worsening symptoms. Instructions were understood and patient agreed with discharge plan.  Final Clinical Impressions(s) / ED Diagnoses   Final diagnoses:  Allergic conjunctivitis of both eyes    New Prescriptions Discharge Medication List as of 09/18/2016 11:25 AM    START taking these medications   Details  cetirizine (ZYRTEC ALLERGY) 10 MG tablet Take 1 tablet (10 mg total) by mouth daily., Starting Thu 09/18/2016, Print         Emeline General, PA-C 09/18/16 1239    Jola Schmidt, MD 09/18/16 1328

## 2016-09-18 NOTE — ED Triage Notes (Signed)
Bilateral eye drainage and burning since yesterday. Pt reports light sensitivity.

## 2016-10-03 ENCOUNTER — Telehealth: Payer: Self-pay | Admitting: *Deleted

## 2016-10-03 NOTE — Telephone Encounter (Signed)
Received Physician Orders from [facility], forwarded to provider/SLS 07/20

## 2016-10-07 ENCOUNTER — Ambulatory Visit (HOSPITAL_BASED_OUTPATIENT_CLINIC_OR_DEPARTMENT_OTHER)
Admission: RE | Admit: 2016-10-07 | Discharge: 2016-10-07 | Disposition: A | Discharge status: Home / Self Care | Payer: Medicare Other | Source: Ambulatory Visit | Attending: Medical | Admitting: Medical

## 2016-10-07 ENCOUNTER — Ambulatory Visit (INDEPENDENT_AMBULATORY_CARE_PROVIDER_SITE_OTHER): Payer: Medicare Other | Admitting: Medical

## 2016-10-07 VITALS — BP 131/78 | HR 88 | Temp 97.7°F | Resp 16 | Ht 67.0 in | Wt 137.0 lb

## 2016-10-07 DIAGNOSIS — M545 Low back pain, unspecified: Secondary | ICD-10-CM

## 2016-10-07 DIAGNOSIS — F32A Depression, unspecified: Secondary | ICD-10-CM

## 2016-10-07 DIAGNOSIS — M5136 Other intervertebral disc degeneration, lumbar region: Secondary | ICD-10-CM | POA: Diagnosis not present

## 2016-10-07 DIAGNOSIS — N186 End stage renal disease: Secondary | ICD-10-CM | POA: Diagnosis not present

## 2016-10-07 DIAGNOSIS — F329 Major depressive disorder, single episode, unspecified: Secondary | ICD-10-CM | POA: Diagnosis not present

## 2016-10-07 DIAGNOSIS — M47816 Spondylosis without myelopathy or radiculopathy, lumbar region: Secondary | ICD-10-CM | POA: Insufficient documentation

## 2016-10-07 DIAGNOSIS — M533 Sacrococcygeal disorders, not elsewhere classified: Secondary | ICD-10-CM | POA: Diagnosis not present

## 2016-10-07 DIAGNOSIS — I70208 Unspecified atherosclerosis of native arteries of extremities, other extremity: Secondary | ICD-10-CM | POA: Diagnosis not present

## 2016-10-07 DIAGNOSIS — I639 Cerebral infarction, unspecified: Secondary | ICD-10-CM | POA: Diagnosis not present

## 2016-10-07 DIAGNOSIS — I700 Atherosclerosis of aorta: Secondary | ICD-10-CM | POA: Insufficient documentation

## 2016-10-07 MED ORDER — VENLAFAXINE HCL ER 37.5 MG PO CP24: 37.5000 mg | ORAL_CAPSULE | Freq: Every day | ORAL | 0 refills | Status: DC

## 2016-10-07 NOTE — Patient Instructions (Addendum)
For coccyx pain and low back pain will get xray of both lumbar and coccyx. You can use pillow to sit on and will write a donut pillow to see if this helps.  For depressed mood rx effexor sent to your pharmacy.   Continue with dialysis.   Reminder regarding history of chf. Watch for weight gain, pedal edema and shorteness of breath lying flat on your back. It start to develop please be seen.  Follow up in October or as needed. Call or my chart [place] I month regarding how you are doing mood wise. We could increase dose of effexor if needed

## 2016-10-07 NOTE — Progress Notes (Signed)
Subjective:    Patient ID: [name], male    DOB: 04/16/1945, 71 y.o.   MRN: [id number]  HPI  Pt in states he has some coccyx are pain. Pain for about for about 2 weeks. No fall or trauma. It hurts to sit up straight. Sitting on a pillow helps.  Pt in states about one month ago he stated got more depressed. Wife states some low level depression for years. Frustrated easily and rude to family. Pt is sleeping well. Appetite is stable. States real sad about his health. No homicidal or suicidal ideations.  Pt not having shortness of breath. No leg swelling.  Pt is now on dialysis. Has been on for about a year.     Review of Systems  Constitutional: Negative for chills, fatigue and fever.  HENT: Negative for dental problem, facial swelling, nosebleeds, postnasal drip, sinus pain and sinus pressure.   Respiratory: Negative for cough, chest tightness, shortness of breath and wheezing.   Cardiovascular: Negative for chest pain and palpitations.  Gastrointestinal: Negative for abdominal pain, diarrhea, nausea and vomiting.  Genitourinary: Negative for dysuria, flank pain, hematuria, testicular pain and urgency.  Musculoskeletal: Positive for back pain. Negative for joint swelling.       And coccyx pain.  Skin: Negative for rash.  Neurological: Negative for dizziness, weakness and headaches.  Hematological: Negative for adenopathy. Does not bruise/bleed easily.  Psychiatric/Behavioral: Positive for dysphoric mood. Negative for agitation, behavioral problems, confusion and decreased concentration. The patient is not nervous/anxious.     Past Medical History:  Diagnosis Date  . 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 Anemia, iron deficiency 03/24/2011   a. Recurrent GI bleed, tx with periodic iron infusions.  . 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 AVM (arteriovenous malformation) of duodenum, acquired    egds in 01/2012, 04/2011  . BPH (benign prostatic hyperplasia)   . CAD  (coronary artery disease)    a. Cath 09/2012: moderate borderline CAD in mid LAD/small diagonal branch, mild RCA stenosis, to be managed medically   . Chronic lower back pain   . Diabetic peripheral neuropathy ([place]) 2014   foot pain.  . Essential hypertension 02/01/2012  . Fatty liver    on CT of 11/2010  . GERD (gastroesophageal reflux disease)   . GI bleed    a. Recurrent GI bleed, tx with IV iron. b. Per heme notes - likely AVMs 04/2012 (tx with cauterization several months ago).  . Hematuria    a. Urology note scan from 07/2012: cystoscopy without evidence for bladder lesion, only lateral hypertrophy of posterior urethra, bladder impression from BPH. b. Pt states he had "some tests" scheduled for later in July 2014.  . High cholesterol   . Hyperlipidemia 02/01/2012  . Hypertension   . Hypertensive urgency 07/06/2016  . 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 Orthostatic hypotension    a. Tx with florinef.  . Peripheral neuropathy   . Polyp, colonic    Colonoscopy 01/2012 "benign" polyp  . Prostate cancer ([place])   . Sleep apnea    "had mask; couldn't sleep in it" (09/20/2012)  . Smoker   . SOB (shortness of breath) 07/06/2016  . Stage III chronic kidney disease    a. Stage 3 (DM with complications ->CKD, peripheral neuropathy).  . Stroke ([place])    x 2  . Symptomatic anemia 04/03/2015  . Tinea pedis 05/24/2012  . Tobacco use 02/01/2012  . Type 2 diabetes, uncontrolled, with neuropathy ([place]) 02/01/2012  .  Type II diabetes mellitus ([place])    a. Dx 1994, uncontrolled.      Social History   Social History  . Marital status: Married    Spouse name: N/A  . Number of children: 2  . Years of education: N/A   Occupational History  . taken out of work due to back    Social History Main Topics  . Smoking status: Former Smoker    Packs/day: 1.00    Years: 30.00    Types: Cigarettes    Start date: 04/15/1968  . Smokeless tobacco: Never Used  . Alcohol use No      Comment: 09/20/2012 "Used to; stopped ~ 2009; never had problem w/it"  . Drug use: No  . Sexual activity: No   Other Topics Concern  . Not on file   Social History Narrative   Regular exercise: rides horses   Caffeine use: occasionally    Past Surgical History:  Procedure Laterality Date  . ENTEROSCOPY N/A 03/01/2013   Procedure: ENTEROSCOPY;  Surgeon: Inda [place], MD;  Location: [place];  Service: Endoscopy;  Laterality: N/A;  . ENTEROSCOPY N/A 06/26/2016   Procedure: ENTEROSCOPY;  Surgeon: Milus Banister, MD;  Location: [place];  Service: Endoscopy;  Laterality: N/A;  this is a push enteroscopy  . GIVENS CAPSULE STUDY N/A 04/05/2015   Procedure: GIVENS CAPSULE STUDY;  Surgeon: Gatha [place], MD;  Location: [place];  Service: Endoscopy;  Laterality: N/A;  . INGUINAL HERNIA REPAIR Right 2011  . LEFT HEART CATHETERIZATION WITH CORONARY ANGIOGRAM N/A 09/21/2012   Procedure: LEFT HEART CATHETERIZATION WITH CORONARY ANGIOGRAM;  Surgeon: Peter M Martinique, MD;  [facility] LAB;  Service: Cardiovascular;  Laterality: N/A;  . LUMBAR DISC SURGERY  1980's  . LYMPHADENECTOMY Bilateral 01/19/2013   Procedure: LYMPHADENECTOMY;  Surgeon: Bernestine Amass, MD;  Location: [facility];  Service: Urology;  Laterality: Bilateral;  . ROBOT ASSISTED LAPAROSCOPIC RADICAL PROSTATECTOMY N/A 01/19/2013   Procedure: ROBOTIC ASSISTED LAPAROSCOPIC RADICAL PROSTATECTOMY;  Surgeon: Bernestine [place], MD;  Location: [facility];  Service: Urology;  Laterality: N/A;  . SHOULDER OPEN ROTATOR CUFF REPAIR Left 1980's    Family History  Problem Relation Age of Onset  . Stroke Father        Died at 53  . Diabetes Mother   . Heart attack Brother        Died at 56  . Diabetes Sister   . Stomach cancer Brother   . Heart attack Sister     No Known Allergies  Current Outpatient Prescriptions on File Prior to Visit  Medication Sig Dispense Refill  . benzonatate (TESSALON) 100 MG capsule Take 1 capsule (100 mg  total) by mouth 3 (three) times daily as needed for cough. 21 capsule 0  . carvedilol (COREG) 3.125 MG tablet Take 1 tablet (3.125 mg total) by mouth 2 (two) times daily with a meal. 60 tablet 0  . cetirizine (ZYRTEC ALLERGY) 10 MG tablet Take 1 tablet (10 mg total) by mouth daily. 30 tablet 0  . clopidogrel (PLAVIX) 75 MG tablet TAKE 1 TABLET BY MOUTH EVERY DAY 30 tablet 0  . fluticasone (FLONASE) 50 MCG/ACT nasal spray Place 2 sprays into both nostrils daily. 16 g 1  . gabapentin (NEURONTIN) 300 MG capsule Take 1 capsule (300 mg total) by mouth at bedtime. 30 capsule 0  . omeprazole (PRILOSEC) 40 MG capsule Take 1 capsule (40 mg total) by mouth daily. 30 capsule 3  . rosuvastatin (CRESTOR)  40 MG tablet Take 1 tablet (40 mg total) by mouth daily. 30 tablet 0   No current facility-administered medications on file prior to visit.     BP 131/78   Pulse 88   Temp 97.7 F (36.5 C) (Oral)   Resp 16   Ht 5\' 7"  (1.702 m)   Wt 137 lb (62.1 kg)   SpO2 100%   BMI 21.46 kg/m       Objective:   Physical Exam  General- No acute distress. Pleasant patient. Neck- Full range of motion, no jvd Lungs- Clear, even and unlabored. Heart- regular rate and rhythm. Neurologic- CNII- XII grossly intact.  Back- faint mild lumbar tenderness. Old scar midline.Mild tenderness to palpation over coccyx and sacrum. Skin- on palpation of sacral dimple no pain. No redness and no swelling. Lower ext- no pedal edema. Negative homans signs.      Assessment & Plan:  For coccyx pain and low back pain will get xray of both lumbar and coccyx. You can use pillow to sit on and will write a donut pillow to see if this helps.  For depressed mood rx effexor sent to your pharmacy.   Continue with dialysis.   Reminder regarding history of chf. Watch for weight gain, pedal edema and shorteness of breath lying flat on your back. It start to develop please be seen.  Follow up in October or as needed. Call or my chart  [place] I month regarding how you are doing mood wise. We could increase dose of effexor if needed

## 2016-10-09 ENCOUNTER — Telehealth: Payer: Self-pay | Admitting: Medical

## 2016-10-09 DIAGNOSIS — M899 Disorder of bone, unspecified: Secondary | ICD-10-CM

## 2016-10-09 NOTE — Telephone Encounter (Signed)
Referral to Dr. Ennever placed. 

## 2016-10-13 ENCOUNTER — Other Ambulatory Visit: Payer: Self-pay | Admitting: *Deleted

## 2016-10-13 DIAGNOSIS — N189 Chronic kidney disease, unspecified: Principal | ICD-10-CM

## 2016-10-13 DIAGNOSIS — D631 Anemia in chronic kidney disease: Secondary | ICD-10-CM

## 2016-10-14 ENCOUNTER — Encounter: Payer: Medicare Other | Admitting: Hematology & Oncology

## 2016-10-14 ENCOUNTER — Other Ambulatory Visit: Payer: Medicare Other

## 2016-10-16 ENCOUNTER — Ambulatory Visit (HOSPITAL_BASED_OUTPATIENT_CLINIC_OR_DEPARTMENT_OTHER): Payer: Medicare Other | Admitting: Hematology & Oncology

## 2016-10-16 ENCOUNTER — Other Ambulatory Visit (HOSPITAL_BASED_OUTPATIENT_CLINIC_OR_DEPARTMENT_OTHER): Payer: Medicare Other

## 2016-10-16 VITALS — BP 135/76 | HR 102 | Temp 98.1°F | Resp 19 | Wt 128.0 lb

## 2016-10-16 DIAGNOSIS — E1140 Type 2 diabetes mellitus with diabetic neuropathy, unspecified: Secondary | ICD-10-CM

## 2016-10-16 DIAGNOSIS — D631 Anemia in chronic kidney disease: Secondary | ICD-10-CM

## 2016-10-16 DIAGNOSIS — N189 Chronic kidney disease, unspecified: Secondary | ICD-10-CM

## 2016-10-16 DIAGNOSIS — Z8546 Personal history of malignant neoplasm of prostate: Secondary | ICD-10-CM

## 2016-10-16 DIAGNOSIS — Z992 Dependence on renal dialysis: Secondary | ICD-10-CM | POA: Diagnosis not present

## 2016-10-16 DIAGNOSIS — E1165 Type 2 diabetes mellitus with hyperglycemia: Secondary | ICD-10-CM

## 2016-10-16 DIAGNOSIS — D508 Other iron deficiency anemias: Secondary | ICD-10-CM

## 2016-10-16 DIAGNOSIS — R937 Abnormal findings on diagnostic imaging of other parts of musculoskeletal system: Secondary | ICD-10-CM

## 2016-10-16 DIAGNOSIS — Q2733 Arteriovenous malformation of digestive system vessel: Secondary | ICD-10-CM | POA: Diagnosis not present

## 2016-10-16 DIAGNOSIS — C61 Malignant neoplasm of prostate: Secondary | ICD-10-CM

## 2016-10-16 DIAGNOSIS — K922 Gastrointestinal hemorrhage, unspecified: Secondary | ICD-10-CM

## 2016-10-16 DIAGNOSIS — IMO0002 Reserved for concepts with insufficient information to code with codable children: Secondary | ICD-10-CM

## 2016-10-16 LAB — CBC WITH DIFFERENTIAL (CANCER CENTER ONLY)
BASO#: 0 10*3/uL (ref 0.0–0.2)
BASO%: 0.6 % (ref 0.0–2.0)
EOS ABS: 0.2 10*3/uL (ref 0.0–0.5)
EOS%: 3.5 % (ref 0.0–7.0)
HCT: 29.5 % — ABNORMAL LOW (ref 38.7–49.9)
HGB: 10.1 g/dL — ABNORMAL LOW (ref 13.0–17.1)
LYMPH#: 0.8 10*3/uL — ABNORMAL LOW (ref 0.9–3.3)
LYMPH%: 17.1 % (ref 14.0–48.0)
MCH: 31.9 pg (ref 28.0–33.4)
MCHC: 34.2 g/dL (ref 32.0–35.9)
MCV: 93 fL (ref 82–98)
MONO#: 1 10*3/uL — AB (ref 0.1–0.9)
MONO%: 21.2 % — AB (ref 0.0–13.0)
NEUT#: 2.8 10*3/uL (ref 1.5–6.5)
NEUT%: 57.6 % (ref 40.0–80.0)
PLATELETS: 256 10*3/uL (ref 145–400)
RBC: 3.17 10*6/uL — ABNORMAL LOW (ref 4.20–5.70)
RDW: 16.4 % — AB (ref 11.1–15.7)
WBC: 4.9 10*3/uL (ref 4.0–10.0)

## 2016-10-16 LAB — CMP (CANCER CENTER ONLY)
ALT(SGPT): 15 U/L (ref 10–47)
AST: 16 U/L (ref 11–38)
Albumin: 2.9 g/dL — ABNORMAL LOW (ref 3.3–5.5)
Alkaline Phosphatase: 98 U/L — ABNORMAL HIGH (ref 26–84)
BUN: 26 mg/dL — AB (ref 7–22)
CHLORIDE: 97 meq/L — AB (ref 98–108)
CO2: 31 meq/L (ref 18–33)
CREATININE: 8.4 mg/dL — AB (ref 0.6–1.2)
Calcium: 8 mg/dL (ref 8.0–10.3)
GLUCOSE: 149 mg/dL — AB (ref 73–118)
POTASSIUM: 4.3 meq/L (ref 3.3–4.7)
SODIUM: 137 meq/L (ref 128–145)
Total Bilirubin: 0.5 mg/dl (ref 0.20–1.60)
Total Protein: 7.2 g/dL (ref 6.4–8.1)

## 2016-10-16 NOTE — Progress Notes (Signed)
Hematology and Oncology Follow Up Visit  [name] [id number] 03/21/1945 71 y.o. 10/16/2016   Principle Diagnosis:  1. 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 IV iron as indicated 2. Supportive blood transfusion as needed     Interim History:  [name] is here today for a follow-up. He is in dialysis. He sees be doing fairly well with dialysis. We have not seen him for almost a year.  Apparently, he was having some lower back pain. He has some plain x-rays done. There was some lucency from L2-L5. The radiologist gave a couple possibilities. Because it was suggested that he might have metastatic disease or myeloma, he was referred back to [place]. No additional studies have been done.   He said the pain is mostly when he sits. He's lost quite a bit of weight since we last saw him. He's had no nausea or vomiting. He says he is eating okay.  His now been about 4 years I think since he had surgery for the prostate cancer.  He's had no fever. He's had no bleeding. He's had no obvious issues with bowels or bladder.  Overall, his performance status is ECOG 2.  Of note: Patient no longer receiving Aranesp due to nonhemorrhagic stroke  Medications:  Allergies as of 10/16/2016   No Known Allergies     Medication List       Accurate as of 10/16/16  2:10 PM. Always use your most recent med list.          amLODipine 5 MG tablet Commonly known as:  NORVASC Take 5 mg by mouth.   benzonatate 100 MG capsule Commonly known as:  TESSALON Take 1 capsule (100 mg total) by mouth 3 (three) times daily as needed for cough.   carvedilol 3.125 MG tablet Commonly known as:  COREG Take 1 tablet (3.125 mg total) by mouth 2  (two) times daily with a meal.   cetirizine 10 MG tablet Commonly known as:  ZYRTEC ALLERGY Take 1 tablet (10 mg total) by mouth daily.   clopidogrel 75 MG tablet Commonly known as:  PLAVIX TAKE 1 TABLET BY MOUTH EVERY DAY   fluticasone 50 MCG/ACT nasal spray Commonly known as:  FLONASE Place 2 sprays into both nostrils daily.   gabapentin 300 MG capsule Commonly known as:  NEURONTIN Take 1 capsule (300 mg total) by mouth at bedtime.   omeprazole 40 MG capsule Commonly known as:  PRILOSEC Take 1 capsule (40 mg total) by mouth daily.   rosuvastatin 40 MG tablet Commonly known as:  CRESTOR Take 1 tablet (40 mg total) by mouth daily.   venlafaxine XR 37.5 MG 24 hr capsule Commonly known as:  EFFEXOR-XR Take 1 capsule (37.5 mg total) by mouth daily with breakfast.       Allergies: No Known Allergies  Past Medical History, Surgical history, Social history, and Family History were reviewed and updated.  Review of Systems: All other 10 point review of systems is negative.   Physical Exam:  weight is 128 lb (58.1 kg). His oral temperature is 98.1 F (36.7 C). His blood pressure is 135/76 and his pulse is 102 (abnormal). His respiration is 19 and oxygen saturation is 100%.   Wt Readings from Last 3  Encounters:  10/16/16 128 lb (58.1 kg)  10/07/16 137 lb (62.1 kg)  09/18/16 134 lb (60.8 kg)    Thin African-American gentleman. Head and neck exam shows no ocular or oral lesions. He has no palpable cervical or supraclavicular lymph nodes. Lungs are clear. Cardiac exam regular rate and rhythm with no murmurs, rubs or bruits. Abdomen is soft. Has good bowel sounds. There is no fluid wave. There is no palpable liver or spleen tip. Back exam shows no tenderness over the spine ribs or hips. There might be some tenderness to palpation in the right pelvis. There is no tenderness over the lumbar spine. No spasms are noted in the paravertebral muscles. Extremities shows no clubbing,  cyanosis or edema. Has good range of motion of his joints. Skin exam shows no rashes, ecchymosis or petechia.  Lab Results  Component Value Date   WBC 4.9 10/16/2016   HGB 10.1 (L) 10/16/2016   HCT 29.5 (L) 10/16/2016   MCV 93 10/16/2016   PLT 256 10/16/2016   Lab Results  Component Value Date   FERRITIN 376 (H) 12/13/2015   IRON 61 12/13/2015   TIBC 205 12/13/2015   UIBC 144 12/13/2015   IRONPCTSAT 30 12/13/2015   Lab Results  Component Value Date   RETICCTPCT 3.0 (H) 03/15/2015   RBC 3.17 (L) 10/16/2016   RETICCTABS 76.5 03/15/2015   No results found for: KPAFRELGTCHN, LAMBDASER, KAPLAMBRATIO No results found for: IGGSERUM, IGA, IGMSERUM No results found for: Odetta Pink, SPEI   Chemistry      Component Value Date/Time   NA 137 10/16/2016 1257   NA 141 11/15/2015 0917   K 4.3 10/16/2016 1257   K 4.0 11/15/2015 0917   CL 97 (L) 10/16/2016 1257   CO2 31 10/16/2016 1257   CO2 27 11/15/2015 0917   BUN 26 (H) 10/16/2016 1257   BUN 18.7 11/15/2015 0917   CREATININE 8.4 (HH) 10/16/2016 1257   CREATININE 5.3 (HH) 11/15/2015 0917      Component Value Date/Time   CALCIUM 8.0 10/16/2016 1257   CALCIUM 8.9 11/15/2015 0917   ALKPHOS 98 (H) 10/16/2016 1257   ALKPHOS 117 11/15/2015 0917   AST 16 10/16/2016 1257   AST 12 11/15/2015 0917   ALT 15 10/16/2016 1257   ALT <9 11/15/2015 0917   BILITOT 0.50 10/16/2016 1257   BILITOT <0.30 11/15/2015 0917     Impression and Plan: [name] is 71 year old gentleman with multifactorial anemia (iron deficiency due to chronic GI bleed - AVM's/chronic renal insufficiency due to uncontrolled diabetes - on hemodialysis).  I'm not sure what the x-ray changes suggest. I would be shocked if he had metastatic prostate cancer. I would have to think that what is being seen is demineralization because of his renal failure and electrolyte abnormalities.  However, we will order a bone scan on  him. I think this would be the simplest way of trying to sort out what might be going on.  I'm also sending off a PSA.  The weight loss does bother me. He's lost almost 20 pounds in July saw him back in September.  I want to see him back in about 3 or 4 weeks.  We spent about 40 minutes with him today. This was a little more complicated than his usual visits.   Volanda Napoleon, MD 8/2/20182:10 PM

## 2016-10-17 LAB — FERRITIN: Ferritin: 394 ng/ml — ABNORMAL HIGH (ref 22–316)

## 2016-10-17 LAB — IRON AND TIBC
%SAT: 15 % — ABNORMAL LOW (ref 20–55)
Iron: 25 ug/dL — ABNORMAL LOW (ref 42–163)
TIBC: 168 ug/dL — ABNORMAL LOW (ref 202–409)
UIBC: 142 ug/dL (ref 117–376)

## 2016-10-17 LAB — PSA: PROSTATE SPECIFIC AG, SERUM: 0.2 ng/mL (ref 0.0–4.0)

## 2016-10-21 ENCOUNTER — Emergency Department (HOSPITAL_COMMUNITY): Payer: Medicare Other

## 2016-10-21 ENCOUNTER — Inpatient Hospital Stay (HOSPITAL_COMMUNITY)
Admission: EM | Admit: 2016-10-21 | Discharge: 2016-10-24 | DRG: 377 | Disposition: A | Discharge status: Home Health | Payer: Medicare Other | Attending: Internal Medicine | Admitting: Internal Medicine

## 2016-10-21 ENCOUNTER — Encounter (HOSPITAL_COMMUNITY): Payer: Self-pay | Admitting: Emergency Medicine

## 2016-10-21 ENCOUNTER — Encounter: Payer: Self-pay | Admitting: Medical

## 2016-10-21 DIAGNOSIS — M545 Low back pain: Secondary | ICD-10-CM | POA: Diagnosis present

## 2016-10-21 DIAGNOSIS — Z992 Dependence on renal dialysis: Secondary | ICD-10-CM

## 2016-10-21 DIAGNOSIS — N2581 Secondary hyperparathyroidism of renal origin: Secondary | ICD-10-CM | POA: Diagnosis present

## 2016-10-21 DIAGNOSIS — W1830XA Fall on same level, unspecified, initial encounter: Secondary | ICD-10-CM | POA: Diagnosis present

## 2016-10-21 DIAGNOSIS — X58XXXA Exposure to other specified factors, initial encounter: Secondary | ICD-10-CM | POA: Diagnosis present

## 2016-10-21 DIAGNOSIS — I10 Essential (primary) hypertension: Secondary | ICD-10-CM | POA: Diagnosis present

## 2016-10-21 DIAGNOSIS — D649 Anemia, unspecified: Secondary | ICD-10-CM | POA: Diagnosis not present

## 2016-10-21 DIAGNOSIS — N400 Enlarged prostate without lower urinary tract symptoms: Secondary | ICD-10-CM | POA: Diagnosis present

## 2016-10-21 DIAGNOSIS — Z7982 Long term (current) use of aspirin: Secondary | ICD-10-CM

## 2016-10-21 DIAGNOSIS — K921 Melena: Secondary | ICD-10-CM | POA: Diagnosis present

## 2016-10-21 DIAGNOSIS — K760 Fatty (change of) liver, not elsewhere classified: Secondary | ICD-10-CM | POA: Diagnosis present

## 2016-10-21 DIAGNOSIS — I132 Hypertensive heart and chronic kidney disease with heart failure and with stage 5 chronic kidney disease, or end stage renal disease: Secondary | ICD-10-CM | POA: Diagnosis present

## 2016-10-21 DIAGNOSIS — I2510 Atherosclerotic heart disease of native coronary artery without angina pectoris: Secondary | ICD-10-CM | POA: Diagnosis present

## 2016-10-21 DIAGNOSIS — Z9119 Patient's noncompliance with other medical treatment and regimen: Secondary | ICD-10-CM

## 2016-10-21 DIAGNOSIS — W19XXXA Unspecified fall, initial encounter: Secondary | ICD-10-CM | POA: Diagnosis present

## 2016-10-21 DIAGNOSIS — E785 Hyperlipidemia, unspecified: Secondary | ICD-10-CM | POA: Diagnosis present

## 2016-10-21 DIAGNOSIS — Z87891 Personal history of nicotine dependence: Secondary | ICD-10-CM

## 2016-10-21 DIAGNOSIS — Z79899 Other long term (current) drug therapy: Secondary | ICD-10-CM

## 2016-10-21 DIAGNOSIS — K922 Gastrointestinal hemorrhage, unspecified: Secondary | ICD-10-CM | POA: Diagnosis present

## 2016-10-21 DIAGNOSIS — C61 Malignant neoplasm of prostate: Secondary | ICD-10-CM

## 2016-10-21 DIAGNOSIS — Z981 Arthrodesis status: Secondary | ICD-10-CM

## 2016-10-21 DIAGNOSIS — K5521 Angiodysplasia of colon with hemorrhage: Principal | ICD-10-CM | POA: Diagnosis present

## 2016-10-21 DIAGNOSIS — R2681 Unsteadiness on feet: Secondary | ICD-10-CM | POA: Diagnosis present

## 2016-10-21 DIAGNOSIS — G4730 Sleep apnea, unspecified: Secondary | ICD-10-CM | POA: Diagnosis present

## 2016-10-21 DIAGNOSIS — D62 Acute posthemorrhagic anemia: Secondary | ICD-10-CM | POA: Diagnosis present

## 2016-10-21 DIAGNOSIS — K558 Other vascular disorders of intestine: Secondary | ICD-10-CM | POA: Diagnosis not present

## 2016-10-21 DIAGNOSIS — E1122 Type 2 diabetes mellitus with diabetic chronic kidney disease: Secondary | ICD-10-CM | POA: Diagnosis present

## 2016-10-21 DIAGNOSIS — I5042 Chronic combined systolic (congestive) and diastolic (congestive) heart failure: Secondary | ICD-10-CM | POA: Diagnosis present

## 2016-10-21 DIAGNOSIS — E118 Type 2 diabetes mellitus with unspecified complications: Secondary | ICD-10-CM | POA: Diagnosis not present

## 2016-10-21 DIAGNOSIS — G8929 Other chronic pain: Secondary | ICD-10-CM | POA: Diagnosis present

## 2016-10-21 DIAGNOSIS — I25119 Atherosclerotic heart disease of native coronary artery with unspecified angina pectoris: Secondary | ICD-10-CM

## 2016-10-21 DIAGNOSIS — K5520 Angiodysplasia of colon without hemorrhage: Secondary | ICD-10-CM | POA: Diagnosis not present

## 2016-10-21 DIAGNOSIS — E8889 Other specified metabolic disorders: Secondary | ICD-10-CM | POA: Diagnosis present

## 2016-10-21 DIAGNOSIS — I959 Hypotension, unspecified: Secondary | ICD-10-CM | POA: Diagnosis present

## 2016-10-21 DIAGNOSIS — K219 Gastro-esophageal reflux disease without esophagitis: Secondary | ICD-10-CM | POA: Diagnosis not present

## 2016-10-21 DIAGNOSIS — D631 Anemia in chronic kidney disease: Secondary | ICD-10-CM | POA: Diagnosis not present

## 2016-10-21 DIAGNOSIS — N186 End stage renal disease: Secondary | ICD-10-CM | POA: Diagnosis present

## 2016-10-21 DIAGNOSIS — Z8546 Personal history of malignant neoplasm of prostate: Secondary | ICD-10-CM

## 2016-10-21 DIAGNOSIS — Z7902 Long term (current) use of antithrombotics/antiplatelets: Secondary | ICD-10-CM

## 2016-10-21 DIAGNOSIS — I639 Cerebral infarction, unspecified: Secondary | ICD-10-CM | POA: Diagnosis present

## 2016-10-21 DIAGNOSIS — Q2733 Arteriovenous malformation of digestive system vessel: Secondary | ICD-10-CM

## 2016-10-21 DIAGNOSIS — D638 Anemia in other chronic diseases classified elsewhere: Secondary | ICD-10-CM | POA: Diagnosis present

## 2016-10-21 DIAGNOSIS — D500 Iron deficiency anemia secondary to blood loss (chronic): Secondary | ICD-10-CM | POA: Diagnosis not present

## 2016-10-21 DIAGNOSIS — Z8673 Personal history of transient ischemic attack (TIA), and cerebral infarction without residual deficits: Secondary | ICD-10-CM

## 2016-10-21 DIAGNOSIS — E1142 Type 2 diabetes mellitus with diabetic polyneuropathy: Secondary | ICD-10-CM | POA: Diagnosis present

## 2016-10-21 DIAGNOSIS — N189 Chronic kidney disease, unspecified: Secondary | ICD-10-CM

## 2016-10-21 LAB — CBC WITH DIFFERENTIAL/PLATELET
Basophils Absolute: 0 10*3/uL (ref 0.0–0.1)
Basophils Relative: 0 %
EOS ABS: 0.1 10*3/uL (ref 0.0–0.7)
EOS PCT: 2 %
HCT: 18 % — ABNORMAL LOW (ref 39.0–52.0)
Hemoglobin: 6 g/dL — CL (ref 13.0–17.0)
LYMPHS ABS: 0.7 10*3/uL (ref 0.7–4.0)
LYMPHS PCT: 9 %
MCH: 30.3 pg (ref 26.0–34.0)
MCHC: 33.3 g/dL (ref 30.0–36.0)
MCV: 90.9 fL (ref 78.0–100.0)
MONOS PCT: 11 %
Monocytes Absolute: 0.9 10*3/uL (ref 0.1–1.0)
Neutro Abs: 6 10*3/uL (ref 1.7–7.7)
Neutrophils Relative %: 77 %
PLATELETS: 237 10*3/uL (ref 150–400)
RBC: 1.98 MIL/uL — ABNORMAL LOW (ref 4.22–5.81)
RDW: 17.1 % — ABNORMAL HIGH (ref 11.5–15.5)
WBC: 7.7 10*3/uL (ref 4.0–10.5)

## 2016-10-21 LAB — COMPREHENSIVE METABOLIC PANEL
ALBUMIN: 2.9 g/dL — AB (ref 3.5–5.0)
ALT: 10 U/L — AB (ref 17–63)
AST: 11 U/L — AB (ref 15–41)
Alkaline Phosphatase: 70 U/L (ref 38–126)
Anion gap: 8 (ref 5–15)
BUN: 37 mg/dL — AB (ref 6–20)
CHLORIDE: 103 mmol/L (ref 101–111)
CO2: 27 mmol/L (ref 22–32)
CREATININE: 8.27 mg/dL — AB (ref 0.61–1.24)
Calcium: 7.5 mg/dL — ABNORMAL LOW (ref 8.9–10.3)
GFR calc Af Amer: 7 mL/min — ABNORMAL LOW (ref >60)
GFR calc non Af Amer: 6 mL/min — ABNORMAL LOW (ref >60)
GLUCOSE: 119 mg/dL — AB (ref 65–99)
POTASSIUM: 4.4 mmol/L (ref 3.5–5.1)
SODIUM: 138 mmol/L (ref 135–145)
Total Bilirubin: 0.5 mg/dL (ref 0.3–1.2)
Total Protein: 6.1 g/dL — ABNORMAL LOW (ref 6.5–8.1)

## 2016-10-21 LAB — POC OCCULT BLOOD, ED: Fecal Occult Bld: POSITIVE — AB

## 2016-10-21 LAB — PROTIME-INR
INR: 1.07
Prothrombin Time: 14 seconds (ref 11.4–15.2)

## 2016-10-21 LAB — CBG MONITORING, ED: GLUCOSE-CAPILLARY: 121 mg/dL — AB (ref 65–99)

## 2016-10-21 LAB — APTT: APTT: 55 s — AB (ref 24–36)

## 2016-10-21 LAB — PREPARE RBC (CROSSMATCH)

## 2016-10-21 LAB — I-STAT TROPONIN, ED: Troponin i, poc: 0.02 ng/mL (ref 0.00–0.08)

## 2016-10-21 MED ORDER — SODIUM CHLORIDE 0.9 % IV SOLN
Freq: Once | INTRAVENOUS | Status: AC
  Administered 2016-10-21: 23:00:00 via INTRAVENOUS

## 2016-10-21 MED ORDER — ACETAMINOPHEN 325 MG PO TABS
650.0000 mg | ORAL_TABLET | Freq: Four times a day (QID) | ORAL | Status: DC | PRN
  Administered 2016-10-22 – 2016-10-24 (×2): 650 mg via ORAL
  Filled 2016-10-21 (×2): qty 2

## 2016-10-21 MED ORDER — CARVEDILOL 3.125 MG PO TABS
3.1250 mg | ORAL_TABLET | Freq: Two times a day (BID) | ORAL | Status: DC
  Administered 2016-10-23 – 2016-10-24 (×2): 3.125 mg via ORAL
  Filled 2016-10-21 (×5): qty 1

## 2016-10-21 MED ORDER — ONDANSETRON HCL 4 MG PO TABS: 4.0000 mg | ORAL_TABLET | Freq: Four times a day (QID) | ORAL | Status: DC | PRN

## 2016-10-21 MED ORDER — ZOLPIDEM TARTRATE 5 MG PO TABS: 5.0000 mg | ORAL_TABLET | Freq: Every evening | ORAL | Status: DC | PRN

## 2016-10-21 MED ORDER — NICOTINE 21 MG/24HR TD PT24
21.0000 mg | MEDICATED_PATCH | Freq: Every day | TRANSDERMAL | Status: DC
  Administered 2016-10-22 – 2016-10-24 (×3): 21 mg via TRANSDERMAL
  Filled 2016-10-21 (×3): qty 1

## 2016-10-21 MED ORDER — PANTOPRAZOLE SODIUM 40 MG IV SOLR: 40.0000 mg | Freq: Two times a day (BID) | INTRAVENOUS | Status: DC

## 2016-10-21 MED ORDER — LORATADINE 10 MG PO TABS
10.0000 mg | ORAL_TABLET | Freq: Every day | ORAL | Status: DC
  Administered 2016-10-22 – 2016-10-24 (×3): 10 mg via ORAL
  Filled 2016-10-21 (×3): qty 1

## 2016-10-21 MED ORDER — ROSUVASTATIN CALCIUM 10 MG PO TABS
40.0000 mg | ORAL_TABLET | Freq: Every day | ORAL | Status: DC
  Administered 2016-10-22 – 2016-10-24 (×3): 40 mg via ORAL
  Filled 2016-10-21 (×3): qty 4

## 2016-10-21 MED ORDER — AMLODIPINE BESYLATE 5 MG PO TABS
5.0000 mg | ORAL_TABLET | Freq: Every day | ORAL | Status: DC
  Administered 2016-10-23 – 2016-10-24 (×2): 5 mg via ORAL
  Filled 2016-10-21 (×3): qty 1

## 2016-10-21 MED ORDER — PANTOPRAZOLE SODIUM 40 MG IV SOLR: 40.0000 mg | Freq: Once | INTRAVENOUS | Status: DC

## 2016-10-21 MED ORDER — ONDANSETRON HCL 4 MG/2ML IJ SOLN: 4.0000 mg | Freq: Four times a day (QID) | INTRAMUSCULAR | Status: DC | PRN

## 2016-10-21 MED ORDER — HYDRALAZINE HCL 20 MG/ML IJ SOLN: 5.0000 mg | INTRAMUSCULAR | Status: DC | PRN

## 2016-10-21 MED ORDER — VENLAFAXINE HCL ER 37.5 MG PO CP24
37.5000 mg | ORAL_CAPSULE | Freq: Every day | ORAL | Status: DC
  Administered 2016-10-24: 37.5 mg via ORAL
  Filled 2016-10-21 (×3): qty 1

## 2016-10-21 MED ORDER — SODIUM CHLORIDE 0.9 % IV SOLN
80.0000 mg | Freq: Once | INTRAVENOUS | Status: AC
  Administered 2016-10-21: 80 mg via INTRAVENOUS
  Filled 2016-10-21: qty 80

## 2016-10-21 MED ORDER — ACETAMINOPHEN 650 MG RE SUPP: 650.0000 mg | Freq: Four times a day (QID) | RECTAL | Status: DC | PRN

## 2016-10-21 MED ORDER — BENZONATATE 100 MG PO CAPS: 100.0000 mg | ORAL_CAPSULE | Freq: Three times a day (TID) | ORAL | Status: DC | PRN

## 2016-10-21 MED ORDER — GABAPENTIN 300 MG PO CAPS
300.0000 mg | ORAL_CAPSULE | Freq: Every day | ORAL | Status: DC
  Administered 2016-10-22 – 2016-10-23 (×3): 300 mg via ORAL
  Filled 2016-10-21 (×3): qty 1

## 2016-10-21 MED ORDER — SODIUM CHLORIDE 0.9 % IV SOLN
10.0000 mL/h | Freq: Once | INTRAVENOUS | Status: AC
  Administered 2016-10-21: 10 mL/h via INTRAVENOUS

## 2016-10-21 MED ORDER — SODIUM CHLORIDE 0.9 % IV SOLN
8.0000 mg/h | INTRAVENOUS | Status: DC
  Administered 2016-10-22: 8 mg/h via INTRAVENOUS
  Filled 2016-10-21 (×3): qty 80

## 2016-10-21 MED ORDER — FLUTICASONE PROPIONATE 50 MCG/ACT NA SUSP
2.0000 | Freq: Every day | NASAL | Status: DC
  Administered 2016-10-22 – 2016-10-24 (×3): 2 via NASAL
  Filled 2016-10-21: qty 16

## 2016-10-21 NOTE — ED Notes (Addendum)
  Test: HGB Critical Value: 6.0   Name of Provider Notified: Ralene Bathe MD

## 2016-10-21 NOTE — ED Triage Notes (Signed)
Per EMS: Pt was walking outside of his house and started to feel weak. Pt fell to the ground, denies LOC, or head/neck/back pain.  Pt states he has been feeling very tired lately. MWF dialysis.  Ortho static positive on scene Bp 120's laying flat 92/58 sitting EMS could no feel pulses when pt stood up.  After 200 NS  123/62 87 HR NSR 164 CBG  Pt had received 400 NS

## 2016-10-21 NOTE — H&P (Signed)
History and Physical    [name]:[id number] DOB: Jun 30, 1945 DOA: 10/21/2016  Referring MD/NP/PA:   PCP: Mackie Pai, PA-C   Patient coming from:  The patient is coming from home.  At baseline, pt is independent for most of ADL.    Chief Complaint: Generalized weakness, melena, fall  HPI: [name] is a 71 y.o. male with medical history significant of hypertension, hyperlipidemia, diet-controlled diabetes, stroke, GERD, prostate cancer, BPH, GI bleeding, AVM, CAD, anemia, chronic combined systolic and diastolic CHF, who presents with generalized weakness, melena and fall.  Patient states that he has been feeling weak recently, which has been progressively getting worse. He fell on the ground when he was walking outside of his house today. Denies any injury to his head or neck. No LOC. No unilateral weakness, numbness or tingling to extremities. No vision change or hearing loss. No slurred speech. patient denies rectal bleeding or dark stool, but he was found to have melena in ED by RN. Patient denies chest pain, shortness breath, cough, nausea, vomiting, diarrhea or abdominal pain. Patient had dialysis on Monday.  ED Course: pt was found to have positive orthostatic vital sign, positive FOBT, hemoglobin dropped from 10.1 on 10/16/16-->6.0, WBC 7.7, negative troponin, potassium 4.4, bicarbonate 27, creatinine 8.27, BUN 37, temperature normal, no tachycardia, oxygen saturation 100% on room air, negative chest x-ray.  Review of Systems:   General: no fevers, chills, no changes in body weight, has poor appetite, has fatigue HEENT: no blurry vision, hearing changes or sore throat Respiratory: no dyspnea, coughing, wheezing CV: no chest pain, no palpitations GI: no nausea, vomiting, abdominal pain, diarrhea, constipation GU: no dysuria, burning on urination, increased urinary frequency, hematuria  Ext: no leg edema Neuro: no unilateral weakness, numbness, or tingling, no vision change  or hearing loss Skin: no rash, no skin tear. MSK: No muscle spasm, no deformity, no limitation of range of movement in spin Heme: No easy bruising.  Travel history: No recent long distant travel.  Allergy: No Known Allergies  Past Medical History:  Diagnosis Date  . 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 Anemia, iron deficiency 03/24/2011   a. Recurrent GI bleed, tx with periodic iron infusions.  . 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 AVM (arteriovenous malformation) of duodenum, acquired    egds in 01/2012, 04/2011  . BPH (benign prostatic hyperplasia)   . CAD (coronary artery disease)    a. Cath 09/2012: moderate borderline CAD in mid LAD/small diagonal branch, mild RCA stenosis, to be managed medically   . Chronic lower back pain   . Diabetic peripheral neuropathy ([place]) 2014   foot pain.  . Essential hypertension 02/01/2012  . Fatty liver    on CT of 11/2010  . GERD (gastroesophageal reflux disease)   . GI bleed    a. Recurrent GI bleed, tx with IV iron. b. Per heme notes - likely AVMs 04/2012 (tx with cauterization several months ago).  . Hematuria    a. Urology note scan from 07/2012: cystoscopy without evidence for bladder lesion, only lateral hypertrophy of posterior urethra, bladder impression from BPH. b. Pt states he had "some tests" scheduled for later in July 2014.  . High cholesterol   . Hyperlipidemia 02/01/2012  . Hypertension   . Hypertensive urgency 07/06/2016  . 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 Orthostatic hypotension    a. Tx with florinef.  . Peripheral neuropathy   . Polyp, colonic    Colonoscopy 01/2012 "benign" polyp  .  Prostate cancer ([place])   . Sleep apnea    "had mask; couldn't sleep in it" (09/20/2012)  . Smoker   . SOB (shortness of breath) 07/06/2016  . Stage III chronic kidney disease    a. Stage 3 (DM with complications ->CKD, peripheral neuropathy).  . Stroke ([place])    x 2  . Symptomatic anemia 04/03/2015  . Tinea pedis  05/24/2012  . Tobacco use 02/01/2012  . Type 2 diabetes, uncontrolled, with neuropathy ([place]) 02/01/2012  . Type II diabetes mellitus ([place])    a. Dx 1994, uncontrolled.     Past Surgical History:  Procedure Laterality Date  . ENTEROSCOPY N/A 03/01/2013   Procedure: ENTEROSCOPY;  Surgeon: Inda Castle, MD;  Location: [place];  Service: Endoscopy;  Laterality: N/A;  . ENTEROSCOPY N/A 06/26/2016   Procedure: ENTEROSCOPY;  Surgeon: Milus Banister, MD;  Location: [place];  Service: Endoscopy;  Laterality: N/A;  this is a push enteroscopy  . GIVENS CAPSULE STUDY N/A 04/05/2015   Procedure: GIVENS CAPSULE STUDY;  Surgeon: Gatha Mayer, MD;  Location: [place];  Service: Endoscopy;  Laterality: N/A;  . INGUINAL HERNIA REPAIR Right 2011  . LEFT HEART CATHETERIZATION WITH CORONARY ANGIOGRAM N/A 09/21/2012   Procedure: LEFT HEART CATHETERIZATION WITH CORONARY ANGIOGRAM;  Surgeon: Peter M Martinique, MD;  Location: [facility] CATH [facility];  Service: Cardiovascular;  Laterality: N/A;  . LUMBAR DISC SURGERY  1980's  . LYMPHADENECTOMY Bilateral 01/19/2013   Procedure: LYMPHADENECTOMY;  Surgeon: Bernestine Amass, MD;  Location: [facility];  Service: Urology;  Laterality: Bilateral;  . ROBOT ASSISTED LAPAROSCOPIC RADICAL PROSTATECTOMY N/A 01/19/2013   Procedure: ROBOTIC ASSISTED LAPAROSCOPIC RADICAL PROSTATECTOMY;  Surgeon: Bernestine Amass, MD;  Location: [facility];  Service: Urology;  Laterality: N/A;  . SHOULDER OPEN ROTATOR CUFF REPAIR Left 1980's    Social History:  reports that he has quit smoking. His smoking use included Cigarettes. He started smoking about 48 years ago. He has a 30.00 pack-year smoking history. He has never used smokeless tobacco. He reports that he does not drink alcohol or use drugs.  Family History:  Family History  Problem Relation Age of Onset  . Stroke Father        Died at 72  . Diabetes Mother   . Heart attack Brother        Died at 16  . Diabetes Sister   . Stomach cancer  Brother   . Heart attack Sister      Prior to Admission medications   Medication Sig Start Date End Date Taking? Authorizing Provider  amLODipine (NORVASC) 5 MG tablet Take 5 mg by mouth. 10/10/15   [provider]  benzonatate (TESSALON) 100 MG capsule Take 1 capsule (100 mg total) by mouth 3 (three) times daily as needed for cough. 08/08/16   Saguier, Percell Miller, PA-C  carvedilol (COREG) 3.125 MG tablet Take 1 tablet (3.125 mg total) by mouth 2 (two) times daily with a meal. 09/12/16   Saguier, Percell Miller, PA-C  cetirizine (ZYRTEC ALLERGY) 10 MG tablet Take 1 tablet (10 mg total) by mouth daily. 09/18/16   Avie Echevaria B, PA-C  clopidogrel (PLAVIX) 75 MG tablet TAKE 1 TABLET BY MOUTH EVERY DAY 09/10/16   Saguier, Percell Miller, PA-C  fluticasone [facility]) 50 MCG/ACT nasal spray Place 2 sprays into both nostrils daily. 08/08/16   Saguier, Percell Miller, PA-C  gabapentin (NEURONTIN) 300 MG capsule Take 1 capsule (300 mg total) by mouth at bedtime. 07/07/16   Domenic Polite, MD  omeprazole [facility])  40 MG capsule Take 1 capsule (40 mg total) by mouth daily. 06/19/16   Saguier, Percell Miller, PA-C  rosuvastatin (CRESTOR) 40 MG tablet Take 1 tablet (40 mg total) by mouth daily. 06/19/16   Saguier, Percell Miller, PA-C  venlafaxine XR (EFFEXOR-XR) 37.5 MG 24 hr capsule Take 1 capsule (37.5 mg total) by mouth daily with breakfast. 10/07/16   Mackie Pai, PA-C    Physical Exam: Vitals:   10/21/16 2330 10/22/16 0002 10/22/16 0018 10/22/16 0407  BP: (!) 143/68 (!) 149/69 (!) 153/78 (!) 151/88  Pulse: 83 81 78 81  Resp: 12 15  14   Temp:  97.9 F (36.6 C) 97.8 F (36.6 C) 97.7 F (36.5 C)  TempSrc:  Oral Oral Oral  SpO2: 100% 100%  100%  Weight:  60.5 kg (133 lb 6.1 oz)    Height:  5\' 7"  (1.702 m)     General: Not in acute distress HEENT:       Eyes: PERRL, EOMI, no scleral icterus.       ENT: No discharge from the ears and nose, no pharynx injection, no tonsillar enlargement.        Neck: No JVD, no bruit, no mass  felt. Heme: No neck lymph node enlargement. Cardiac: S1/S2, RRR, No murmurs, No gallops or rubs. Respiratory:  No rales, wheezing, rhonchi or rubs. GI: Soft, nondistended, nontender, no rebound pain, no organomegaly, BS present. GU: No hematuria Ext: No pitting leg edema bilaterally. 2+DP/PT pulse bilaterally. Musculoskeletal: No joint deformities, No joint redness or warmth, no limitation of ROM in spin. Skin: No rashes.  Neuro: Alert, oriented X3, cranial nerves II-XII grossly intact, moves all extremities normally.  Psych: Patient is not psychotic, no suicidal or hemocidal ideation.  Labs on Admission: I have personally reviewed following labs and imaging studies  CBC:  Recent Labs Lab 10/16/16 1257 10/21/16 2013 10/22/16 0208  WBC 4.9 7.7 6.8  NEUTROABS 2.8 6.0  --   HGB 10.1* 6.0* 7.5*  HCT 29.5* 18.0* 22.9*  MCV 93 90.9 89.1  PLT 256 237 106   Basic Metabolic Panel:  Recent Labs Lab 10/16/16 1257 10/21/16 2013 10/22/16 0208  NA 137 138 136  K 4.3 4.4 4.5  CL 97* 103 101  CO2 31 27 25   GLUCOSE 149* 119* 168*  BUN 26* 37* 40*  CREATININE 8.4* 8.27* 8.65*  CALCIUM 8.0 7.5* 7.6*   GFR: Estimated Creatinine Clearance: 6.9 mL/min (A) (by C-G formula based on SCr of 8.65 mg/dL (H)). Liver Function Tests:  Recent Labs Lab 10/16/16 1257 10/21/16 2013  AST 16 11*  ALT 15 10*  ALKPHOS 98* 70  BILITOT 0.50 0.5  PROT 7.2 6.1*  ALBUMIN 2.9* 2.9*   No results for input(s): LIPASE, AMYLASE in the last 168 hours. No results for input(s): AMMONIA in the last 168 hours. Coagulation Profile:  Recent Labs Lab 10/21/16 2013  INR 1.07   Cardiac Enzymes: No results for input(s): CKTOTAL, CKMB, CKMBINDEX, TROPONINI in the last 168 hours. BNP (last 3 results)  Recent Labs  07/09/16 1438 07/31/16 1104 08/19/16 1155  PROBNP 1,044.0* 343.0* 349.0*   HbA1C: No results for input(s): HGBA1C in the last 72 hours. CBG:  Recent Labs Lab 10/21/16 1920  10/22/16 0544  GLUCAP 121* 84   Lipid Profile: No results for input(s): CHOL, HDL, LDLCALC, TRIG, CHOLHDL, LDLDIRECT in the last 72 hours. Thyroid Function Tests: No results for input(s): TSH, T4TOTAL, FREET4, T3FREE, THYROIDAB in the last 72 hours. Anemia Panel:  Recent Labs  10/22/16 0208  VITAMINB12 821  FOLATE 49.8  FERRITIN 275  TIBC 188*  IRON 40*  RETICCTPCT 2.0   Urine analysis:    Component Value Date/Time   COLORURINE YELLOW 04/01/2016 [place] 04/01/2016 0939   LABSPEC >=1.030 (A) 04/01/2016 0939   PHURINE 5.5 04/01/2016 0939   GLUCOSEU NEGATIVE 04/01/2016 0939   HGBUR MODERATE (A) 04/01/2016 0939   BILIRUBINUR SMALL (A) 04/01/2016 0939   BILIRUBINUR small 06/21/2012 1041   KETONESUR TRACE (A) 04/01/2016 0939   PROTEINUR >300 (A) 12/25/2015 1028   UROBILINOGEN 0.2 04/01/2016 0939   NITRITE NEGATIVE 04/01/2016 0939   LEUKOCYTESUR NEGATIVE 04/01/2016 0939   Sepsis Labs: @LABRCNTIP (procalcitonin:4,lacticidven:4) )No results found for this or any previous visit (from the past 240 hour(s)).   Radiological Exams on Admission: Dg Chest 2 View  Result Date: 10/21/2016 CLINICAL DATA:  Hypertension.  Weakness. EXAM: CHEST  2 VIEW COMPARISON:  August 19, 2016 FINDINGS: Central catheter tip is in the superior vena cava near the cavoatrial junction. No pneumothorax. There is no edema or consolidation. The heart size and pulmonary vascularity are normal. No adenopathy. No bone lesions. IMPRESSION: Central catheter as described without pneumothorax. No edema or consolidation. Electronically Signed   By: Lowella Grip III M.D.   On: 10/21/2016 20:57     EKG: Independently reviewed.  Sinus rhythm, QTC 470, mild T-wave inversion in inferior leads and V4-V5.   Assessment/Plan Principal Problem:   Symptomatic anemia Active Problems:   Anemia of renal disease   Essential hypertension   Hyperlipidemia   CAD (coronary artery disease)   Cerebral  infarction ([name])   GERD (gastroesophageal reflux disease)   AVM (arteriovenous malformation) of small bowel, acquired with hemorrhage ([name])   ESRD (end stage renal disease) ([name])   GIB (gastrointestinal bleeding)   Symptomatic anemia and GIB: Hemoglobin dropped from 10.1 on 10/16/16-6.0. Patient had small bowel endoscopy by Dr. Ardis Hughs on 06/26/16, which showed AVM. Currently hemodynamically stable.  - will admit to tele bed as inpt - NPO  - will transfuse 1 Unit of blood now - hold plavix - Start IV pantoprazole gtt - Zofran IV for nausea - Avoid NSAIDs and SQ heparin - Maintain IV access (2 large bore IVs if possible). - Monitor closely and follow q6h cbc, transfuse as necessary. - LaB: INR, PTT and type screen - please call GI in AM  Anemia of renal disease: now has GIB -anemia panel  HTN: -IV hydralazine when necessary -Continue Coreg, amlodipine  HLD: -crestor  Hx of stroke: -hold plavix due to GIB -continue Crestor  CAD: no CP -continue Coreg, Crestor  GERD: -Protonix IV  ESRD (end stage renal disease) on dialysis (MWF):  -Left message to renal box for HD -Continue calcium hesitate  Fall: Patient is strongly denies any head or neck injury. Most likely due to positive orthostatic vital sign secondary to severe anemia. -Refused CT head and the neck -pt/ot  DVT ppx: SCD Code Status: Full code Family Communication: None at bed side.    Disposition Plan:  Anticipate discharge back to previous home environment Consults called:  nonbe Admission status: Inpatient/tele      Date of Service 10/22/2016    Ivor Costa [facility] Pager [phone]  If 7PM-7AM, please contact night-coverage [email or web address] Password [facility] 10/22/2016, 6:32 AM

## 2016-10-21 NOTE — Progress Notes (Signed)
  Pt admitted to the unit. Pt is stable, alert and oriented per baseline. Oriented to room, staff, and call [place]. Educated to call for any assistance. Bed in lowest position, call [place] within reach- will continue to monitor. 

## 2016-10-22 ENCOUNTER — Encounter (HOSPITAL_COMMUNITY): Payer: Self-pay | Admitting: General Practice

## 2016-10-22 DIAGNOSIS — D500 Iron deficiency anemia secondary to blood loss (chronic): Secondary | ICD-10-CM

## 2016-10-22 DIAGNOSIS — K921 Melena: Secondary | ICD-10-CM

## 2016-10-22 DIAGNOSIS — W19XXXA Unspecified fall, initial encounter: Secondary | ICD-10-CM | POA: Diagnosis present

## 2016-10-22 LAB — CBC
HCT: 22.9 % — ABNORMAL LOW (ref 39.0–52.0)
HEMATOCRIT: 23.5 % — AB (ref 39.0–52.0)
HEMOGLOBIN: 7.9 g/dL — AB (ref 13.0–17.0)
Hemoglobin: 7.5 g/dL — ABNORMAL LOW (ref 13.0–17.0)
MCH: 29.2 pg (ref 26.0–34.0)
MCH: 29.8 pg (ref 26.0–34.0)
MCHC: 32.8 g/dL (ref 30.0–36.0)
MCHC: 33.6 g/dL (ref 30.0–36.0)
MCV: 89.1 fL (ref 78.0–100.0)
MCV: 88.7 fL (ref 78.0–100.0)
Platelets: 252 10*3/uL (ref 150–400)
Platelets: 251 10*3/uL (ref 150–400)
RBC: 2.57 MIL/uL — ABNORMAL LOW (ref 4.22–5.81)
RBC: 2.65 MIL/uL — ABNORMAL LOW (ref 4.22–5.81)
RDW: 16.7 % — AB (ref 11.5–15.5)
RDW: 17 % — AB (ref 11.5–15.5)
WBC: 6.8 10*3/uL (ref 4.0–10.5)
WBC: 5.1 10*3/uL (ref 4.0–10.5)

## 2016-10-22 LAB — VITAMIN B12: Vitamin B-12: 821 pg/mL (ref 180–914)

## 2016-10-22 LAB — BASIC METABOLIC PANEL
ANION GAP: 10 (ref 5–15)
BUN: 40 mg/dL — AB (ref 6–20)
CO2: 25 mmol/L (ref 22–32)
Calcium: 7.6 mg/dL — ABNORMAL LOW (ref 8.9–10.3)
Chloride: 101 mmol/L (ref 101–111)
Creatinine, Ser: 8.65 mg/dL — ABNORMAL HIGH (ref 0.61–1.24)
GFR, EST AFRICAN AMERICAN: 6 mL/min — AB (ref >60)
GFR, EST NON AFRICAN AMERICAN: 6 mL/min — AB (ref >60)
Glucose, Bld: 168 mg/dL — ABNORMAL HIGH (ref 65–99)
POTASSIUM: 4.5 mmol/L (ref 3.5–5.1)
SODIUM: 136 mmol/L (ref 135–145)

## 2016-10-22 LAB — IRON AND TIBC
Iron: 40 ug/dL — ABNORMAL LOW (ref 45–182)
SATURATION RATIOS: 21 % (ref 17.9–39.5)
TIBC: 188 ug/dL — ABNORMAL LOW (ref 250–450)
UIBC: 148 ug/dL

## 2016-10-22 LAB — RETICULOCYTES
RBC.: 2.57 MIL/uL — AB (ref 4.22–5.81)
Retic Count, Absolute: 51.4 10*3/uL (ref 19.0–186.0)
Retic Ct Pct: 2 % (ref 0.4–3.1)

## 2016-10-22 LAB — GLUCOSE, CAPILLARY: GLUCOSE-CAPILLARY: 84 mg/dL (ref 65–99)

## 2016-10-22 LAB — FOLATE: Folate: 49.8 ng/mL (ref >5.9)

## 2016-10-22 LAB — FERRITIN: Ferritin: 275 ng/mL (ref 24–336)

## 2016-10-22 MED ORDER — DOXERCALCIFEROL 4 MCG/2ML IV SOLN
2.5000 ug | INTRAVENOUS | Status: DC
  Administered 2016-10-23 – 2016-10-24 (×2): 2.5 ug via INTRAVENOUS
  Filled 2016-10-22: qty 2

## 2016-10-22 MED ORDER — RENA-VITE PO TABS
1.0000 | ORAL_TABLET | Freq: Every day | ORAL | Status: DC
  Administered 2016-10-22 – 2016-10-23 (×2): 1 via ORAL
  Filled 2016-10-22 (×2): qty 1

## 2016-10-22 MED ORDER — CALCIUM ACETATE (PHOS BINDER) 667 MG PO CAPS
667.0000 mg | ORAL_CAPSULE | Freq: Every day | ORAL | Status: DC
  Administered 2016-10-22 – 2016-10-23 (×2): 667 mg via ORAL
  Filled 2016-10-22 (×2): qty 1

## 2016-10-22 NOTE — Evaluation (Signed)
Physical Therapy Evaluation Patient Details Name: [name] MRN: [id number] DOB: December 09, 1945 Today's Date: 10/22/2016   History of Present Illness   [name] is a 71 y.o. male with medical history significant for CVA with mild R side residual weakness, hypertension, hyperlipidemia, diet-controlled diabetes, stroke, GERD, prostate cancer, BPH, GI bleeding, AVM, CAD, anemia, chronic combined systolic and diastolic CHF, who presents with generalized weakness, melena and fall.  Clinical Impression  Pt admitted with/for the above complications.  Pt is at a supervision level presently, still weak on R (stroke side), improving toward baseline, but still weak and anemic.[name] Kitchen  Pt currently limited functionally due to the problems listed below.  (see problems list.)  Pt will benefit from PT to maximize function and safety to be able to get home safely with available assist.     Follow Up Recommendations Home health PT;Other (comment) (home safety eval and treat for deconditioning)    Equipment Recommendations  None recommended by PT    Recommendations for Other Services       Precautions / Restrictions Precautions Precautions: Fall      Mobility  Bed Mobility Overal bed mobility: Needs Assistance Bed Mobility: Supine to Sit;Sit to Supine     Supine to sit: Supervision Sit to supine: Supervision      Transfers Overall transfer level: Needs assistance   Transfers: Sit to/from Stand Sit to Stand: Supervision            Ambulation/Gait Ambulation/Gait assistance: Supervision Ambulation Distance (Feet): 300 Feet Assistive device: None (vs IV pole initially) Gait Pattern/deviations: Step-through pattern   Gait velocity interpretation: at or above normal speed for age/gender General Gait Details: generally steady with mild hemiparetic gait R with decreased control at R knee.  Stairs            Wheelchair Mobility    Modified Rankin (Stroke Patients Only)        Balance Overall balance assessment: Needs assistance Sitting-balance support: No upper extremity supported Sitting balance-Leahy Scale: Good     Standing balance support: No upper extremity supported Standing balance-Leahy Scale: Fair                               Pertinent Vitals/Pain Pain Assessment: No/denies pain    Home Living Family/patient expects to be discharged to:: Private residence Living Arrangements: Spouse/significant other Available Help at Discharge: Family;Available PRN/intermittently Type of Home: House Home Access: Stairs to enter Entrance Stairs-Rails: None Entrance Stairs-Number of Steps: 2 Home Layout: One level Home Equipment: Cane - single point Additional Comments: wife works during the day     Prior Function Level of Independence: Independent;Independent with assistive device(s)         Comments: Patient drives himself to HD     Hand Dominance   Dominant Hand: Right    Extremity/Trunk Assessment   Upper Extremity Assessment Upper Extremity Assessment: Defer to OT evaluation    Lower Extremity Assessment Lower Extremity Assessment: Overall WFL for tasks assessed;RLE deficits/detail RLE Deficits / Details: overall 4-5, strength around knee 3+ with suboptimal control of R knee hyperextension. RLE Coordination: decreased fine motor       Communication   Communication: No difficulties  Cognition Arousal/Alertness: Awake/alert Behavior During Therapy: WFL for tasks assessed/performed Overall Cognitive Status: Within Functional Limits for tasks assessed  General Comments General comments (skin integrity, edema, etc.): Noisy pulse oxymetry, but when stops to get good reading, sats are in 90's on RA, EHR 60's-80's    Exercises     Assessment/Plan    PT Assessment Patient needs continued PT services  PT Problem List Decreased strength;Decreased activity  tolerance;Decreased balance;Decreased mobility;Cardiopulmonary status limiting activity       PT Treatment Interventions DME instruction;Gait training;Stair training;Functional mobility training;Therapeutic activities;Balance training;Patient/family education    PT Goals (Current goals can be found in the Care Plan section)  Acute Rehab PT Goals Patient Stated Goal: independence PT Goal Formulation: With patient Time For Goal Achievement: 10/29/16 Potential to Achieve Goals: Good    Frequency Min 3X/week   Barriers to discharge        Co-evaluation               AM-PAC PT "6 Clicks" Daily Activity  Outcome Measure Difficulty turning over in bed (including adjusting bedclothes, sheets and blankets)?: None Difficulty moving from lying on back to sitting on the side of the bed? : None Difficulty sitting down on and standing up from a chair with arms (e.g., wheelchair, bedside commode, etc,.)?: A Little Help needed moving to and from a bed to chair (including a wheelchair)?: A Little Help needed walking in hospital room?: A Little Help needed climbing 3-5 steps with a railing? : A Little 6 Click Score: 20    End of Session   Activity Tolerance: Patient tolerated treatment well Patient left: in bed;with call Sann/phone within reach;with bed alarm set Nurse Communication: Mobility status PT Visit Diagnosis: Unsteadiness on feet (R26.81);Other abnormalities of gait and mobility (R26.89)    Time: 2458-0998 PT Time Calculation (min) (ACUTE ONLY): 20 min   Charges:   PT Evaluation $PT Eval Moderate Complexity: 1 Mod     PT G Codes:        11-12-16  Donnella Sham, PT [phone]  (pager)  Tessie Fass Jovannie Ulibarri 11-12-2016, 10:59 AM

## 2016-10-22 NOTE — Care Management Note (Signed)
Case Management Note Marvetta Gibbons RN, BSN Unit 4E-Case Manager [phone]  Patient Details  Name: RAHUL MALINAK MRN: [id number] Date of Birth: 1945/08/22  Subjective/Objective:    Pt admitted with symptomatic anemia               Action/Plan: PTA pt lived at home with spouse- per PT eval recommendation for HHPT- will need order prior to discharge- CM to follow  Expected Discharge Date:  10/24/16               Expected Discharge Plan:  [place]  In-House Referral:     Discharge planning Services  CM Consult  Post Acute Care Choice:  Home Health Choice offered to:     DME Arranged:    DME Agency:     HH Arranged:    [place] Agency:     Status of Service:  In process, will continue to follow  If discussed at Long Length of Stay Meetings, dates discussed:     Discharge Disposition:   Additional Comments:  Dawayne Patricia, RN 10/22/2016, 12:36 PM

## 2016-10-22 NOTE — Progress Notes (Signed)
Patient aware, npo after midnight for endoscopy procedure tomorrow.

## 2016-10-22 NOTE — ED Provider Notes (Signed)
[facility] DEPT Provider Note   CSN: [id number] Arrival date & time: 10/21/16  1912     History   Chief Complaint Chief Complaint  Patient presents with  . Weakness    HPI KEITH FELTEN is a 71 y.o. male.  The history is provided by the patient and the spouse. No language interpreter was used.   JAZPER NIKOLAI is a 71 y.o. male who presents to the Emergency Department complaining of weakness.  He reports progressive bilateral lower extremity weakness. Symptoms have been ongoing for "a while". Today he was too weak to even stand up. He denies any fevers, chest pain, abdominal pain, nausea, vomiting, black or bloody stools. He has ESRD and is on dialysis Monday, Wednesday, Friday. Last session was on Monday. He does have some associated shortness of breath.  Past Medical History:  Diagnosis Date  . 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 Anemia, iron deficiency 03/24/2011   a. Recurrent GI bleed, tx with periodic iron infusions.  . 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 AVM (arteriovenous malformation) of duodenum, acquired    egds in 01/2012, 04/2011  . BPH (benign prostatic hyperplasia)   . CAD (coronary artery disease)    a. Cath 09/2012: moderate borderline CAD in mid LAD/small diagonal branch, mild RCA stenosis, to be managed medically   . Chronic lower back pain   . Diabetic peripheral neuropathy ([place]) 2014   foot pain.  . Essential hypertension 02/01/2012  . Fatty liver    on CT of 11/2010  . GERD (gastroesophageal reflux disease)   . GI bleed    a. Recurrent GI bleed, tx with IV iron. b. Per heme notes - likely AVMs 04/2012 (tx with cauterization several months ago).  . Hematuria    a. Urology note scan from 07/2012: cystoscopy without evidence for bladder lesion, only lateral hypertrophy of posterior urethra, bladder impression from BPH. b. Pt states he had "some tests" scheduled for later in July 2014.  . High cholesterol   . Hyperlipidemia 02/01/2012  .  Hypertension   . Hypertensive urgency 07/06/2016  . 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 Orthostatic hypotension    a. Tx with florinef.  . Peripheral neuropathy   . Polyp, colonic    Colonoscopy 01/2012 "benign" polyp  . Prostate cancer ([place])   . Sleep apnea    "had mask; couldn't sleep in it" (09/20/2012)  . Smoker   . SOB (shortness of breath) 07/06/2016  . Stage III chronic kidney disease    a. Stage 3 (DM with complications ->CKD, peripheral neuropathy).  . Stroke ([place])    x 2  . Symptomatic anemia 04/03/2015  . Tinea pedis 05/24/2012  . Tobacco use 02/01/2012  . Type 2 diabetes, uncontrolled, with neuropathy ([place]) 02/01/2012  . Type II diabetes mellitus ([place])    a. Dx 1994, uncontrolled.     Patient Active Problem List   Diagnosis Date Noted  . GIB (gastrointestinal bleeding) 10/21/2016  . Abnormal echocardiogram 08/06/2016  . Flash pulmonary edema ([place]) 07/06/2016  . SOB (shortness of breath) 07/06/2016  . Hypertensive urgency 07/06/2016  . Anemia in chronic kidney disease, on chronic dialysis ([place])   . Anemia due to chronic kidney disease   . Diabetic retinopathy ([place]) 01/16/2016  . Dysphagia 12/21/2015  . Environmental allergies 12/04/2015  . Allergic rhinitis 10/28/2015  . ESRD (end stage renal disease) ([place]) 10/28/2015  . Stroke ([place]) 04/06/2015  . AVM (arteriovenous malformation) of small bowel, acquired with hemorrhage (  [employer]) 04/06/2015  . Malnutrition of moderate degree 04/05/2015  . Acute renal failure superimposed on stage 4 chronic kidney disease ([place]) 04/04/2015  . Symptomatic anemia 04/03/2015  . Benign paroxysmal positional vertigo 09/26/2014  . Weakness of right lower extremity 02/28/2014  . Metatarsalgia of both feet 09/13/2013  . Equinus deformity of foot, acquired 09/13/2013  . Diabetes mellitus type 2, controlled, with complications ([place]) 43/15/4008  . GERD (gastroesophageal reflux disease) 03/24/2013  . GI bleed 02/28/2013  .  Cerebral infarction ([place]) 11/17/2012  . Dizziness and giddiness 10/31/2012  . Prostate cancer ([place]) 10/27/2012  . Diabetic neuropathy ([place]) 10/04/2012  . CAD (coronary artery disease) 10/04/2012  . Hoarseness 06/26/2012  . Hematuria 05/24/2012  . Tinea pedis 05/24/2012  . Back pain 02/23/2012  . Erectile dysfunction 02/03/2012  . Type 2 diabetes, uncontrolled, with neuropathy ([place]) 02/01/2012  . Essential hypertension 02/01/2012  . Tobacco use 02/01/2012  . Hyperlipidemia 02/01/2012  . Anemia of renal disease 05/14/2011    Past Surgical History:  Procedure Laterality Date  . ENTEROSCOPY N/A 03/01/2013   Procedure: ENTEROSCOPY;  Surgeon: Inda Castle, MD;  Location: [facility];  Service: Endoscopy;  Laterality: N/A;  . ENTEROSCOPY N/A 06/26/2016   Procedure: ENTEROSCOPY;  Surgeon: Milus Banister, MD;  Location: [facility];  Service: Endoscopy;  Laterality: N/A;  this is a push enteroscopy  . GIVENS CAPSULE STUDY N/A 04/05/2015   Procedure: GIVENS CAPSULE STUDY;  Surgeon: Gatha Mayer, MD;  Location: [facility];  Service: Endoscopy;  Laterality: N/A;  . INGUINAL HERNIA REPAIR Right 2011  . LEFT HEART CATHETERIZATION WITH CORONARY ANGIOGRAM N/A 09/21/2012   Procedure: LEFT HEART CATHETERIZATION WITH CORONARY ANGIOGRAM;  Surgeon: Peter M Martinique, MD;  Location: [facility] CATH LAB;  Service: Cardiovascular;  Laterality: N/A;  . LUMBAR DISC SURGERY  1980's  . LYMPHADENECTOMY Bilateral 01/19/2013   Procedure: LYMPHADENECTOMY;  Surgeon: Bernestine Amass, MD;  Location: [facility];  Service: Urology;  Laterality: Bilateral;  . ROBOT ASSISTED LAPAROSCOPIC RADICAL PROSTATECTOMY N/A 01/19/2013   Procedure: ROBOTIC ASSISTED LAPAROSCOPIC RADICAL PROSTATECTOMY;  Surgeon: Bernestine Amass, MD;  Location: [facility];  Service: Urology;  Laterality: N/A;  . SHOULDER OPEN ROTATOR CUFF REPAIR Left 1980's       Home Medications    Prior to Admission medications   Medication Sig Start Date End Date Taking?  Authorizing Provider  Calcium Acetate 668 (169 Ca) MG TABS Take 1 tablet by mouth daily.   Yes [provider]  carvedilol (COREG) 3.125 MG tablet Take 1 tablet (3.125 mg total) by mouth 2 (two) times daily with a meal. 09/12/16  Yes Saguier, Percell Miller, PA-C  cetirizine (ZYRTEC ALLERGY) 10 MG tablet Take 1 tablet (10 mg total) by mouth daily. 09/18/16  Yes Avie Echevaria B, PA-C  clopidogrel (PLAVIX) 75 MG tablet TAKE 1 TABLET BY MOUTH EVERY DAY 09/10/16  Yes Saguier, Percell Miller, PA-C  gabapentin (NEURONTIN) 300 MG capsule Take 1 capsule (300 mg total) by mouth at bedtime. Patient taking differently: Take 300 mg by mouth 3 (three) times daily.  07/07/16  Yes Domenic Polite, MD  multivitamin (RENA-VIT) TABS tablet Take 1 tablet by mouth daily.   Yes [provider]  omeprazole (PRILOSEC) 40 MG capsule Take 1 capsule (40 mg total) by mouth daily. 06/19/16  Yes Saguier, Percell Miller, PA-C  rosuvastatin (CRESTOR) 40 MG tablet Take 1 tablet (40 mg total) by mouth daily. 06/19/16  Yes Saguier, Percell Miller, PA-C  venlafaxine XR (EFFEXOR-XR) 37.5 MG 24 hr capsule Take 1  capsule (37.5 mg total) by mouth daily with breakfast. 10/07/16  Yes Saguier, Percell Miller, PA-C    Family History Family History  Problem Relation Age of Onset  . Stroke Father        Died at 61  . Diabetes Mother   . Heart attack Brother        Died at 52  . Diabetes Sister   . Stomach cancer Brother   . Heart attack Sister     Social History Social History  Substance Use Topics  . Smoking status: Former Smoker    Packs/day: 1.00    Years: 30.00    Types: Cigarettes    Start date: 04/15/1968  . Smokeless tobacco: Never Used  . Alcohol use No     Comment: 09/20/2012 "Used to; stopped ~ 2009; never had problem w/it"     Allergies   Patient has no known allergies.   Review of Systems Review of Systems  All other systems reviewed and are negative.    Physical Exam Updated Vital Signs BP (!) 153/78   Pulse 78   Temp 97.8  F (36.6 C) (Oral)   Resp 15   Ht 5\' 7"  (1.702 m)   Wt 60.5 kg (133 lb 6.1 oz)   SpO2 100%   BMI 20.89 kg/m   Physical Exam  Constitutional: He is oriented to person, place, and time. He appears well-developed and well-nourished.  HENT:  Head: Normocephalic and atraumatic.  Cardiovascular: Normal rate and regular rhythm.   No murmur heard. Pulmonary/Chest: Effort normal and breath sounds normal. No respiratory distress.  Abdominal: Soft. There is no tenderness. There is no rebound and no guarding.  Genitourinary:  Genitourinary Comments: Black stool, Hemoccult positive  Musculoskeletal: He exhibits no edema or tenderness.  Neurological: He is alert and oriented to person, place, and time.  Skin: Skin is warm and dry.  Psychiatric: He has a normal mood and affect. His behavior is normal.  Nursing note and vitals reviewed.    ED Treatments / Results  Labs (all labs ordered are listed, but only abnormal results are displayed) Labs Reviewed  COMPREHENSIVE METABOLIC PANEL - Abnormal; Notable for the following:       Result Value   Glucose, Bld 119 (*)    BUN 37 (*)    Creatinine, Ser 8.27 (*)    Calcium 7.5 (*)    Total Protein 6.1 (*)    Albumin 2.9 (*)    AST 11 (*)    ALT 10 (*)    GFR calc non Af Amer 6 (*)    GFR calc Af Amer 7 (*)    All other components within normal limits  CBC WITH DIFFERENTIAL/PLATELET - Abnormal; Notable for the following:    RBC 1.98 (*)    Hemoglobin 6.0 (*)    HCT 18.0 (*)    RDW 17.1 (*)    All other components within normal limits  APTT - Abnormal; Notable for the following:    aPTT 55 (*)    All other components within normal limits  CBG MONITORING, ED - Abnormal; Notable for the following:    Glucose-Capillary 121 (*)    All other components within normal limits  POC OCCULT BLOOD, ED - Abnormal; Notable for the following:    Fecal Occult Bld POSITIVE (*)    All other components within normal limits  PROTIME-INR  CBC  CBC  CBC   VITAMIN B12  FOLATE  IRON AND TIBC  FERRITIN  RETICULOCYTES  BASIC METABOLIC PANEL  CBC  I-STAT TROPONIN, ED  TYPE AND SCREEN  PREPARE RBC (CROSSMATCH)    EKG  EKG Interpretation None       Radiology Dg Chest 2 View  Result Date: 10/21/2016 CLINICAL DATA:  Hypertension.  Weakness. EXAM: CHEST  2 VIEW COMPARISON:  August 19, 2016 FINDINGS: Central catheter tip is in the superior vena cava near the cavoatrial junction. No pneumothorax. There is no edema or consolidation. The heart size and pulmonary vascularity are normal. No adenopathy. No bone lesions. IMPRESSION: Central catheter as described without pneumothorax. No edema or consolidation. Electronically Signed   By: Lowella Grip III M.D.   On: 10/21/2016 20:57    Procedures Procedures (including critical care time)  Medications Ordered in ED Medications  amLODipine (NORVASC) tablet 5 mg (not administered)  venlafaxine XR (EFFEXOR-XR) 24 hr capsule 37.5 mg (not administered)  loratadine (CLARITIN) tablet 10 mg (not administered)  carvedilol (COREG) tablet 3.125 mg (not administered)  benzonatate (TESSALON) capsule 100 mg (not administered)  fluticasone (FLONASE) 50 MCG/ACT nasal spray 2 spray (not administered)  gabapentin (NEURONTIN) capsule 300 mg (300 mg Oral Given 10/22/16 0037)  rosuvastatin (CRESTOR) tablet 40 mg (not administered)  nicotine (NICODERM CQ - dosed in mg/24 hours) patch 21 mg (not administered)  pantoprazole (PROTONIX) 80 mg in sodium chloride 0.9 % 250 mL (0.32 mg/mL) infusion (not administered)  pantoprazole (PROTONIX) injection 40 mg (not administered)  acetaminophen (TYLENOL) tablet 650 mg (not administered)    Or  acetaminophen (TYLENOL) suppository 650 mg (not administered)  ondansetron (ZOFRAN) tablet 4 mg (not administered)    Or  ondansetron (ZOFRAN) injection 4 mg (not administered)  hydrALAZINE (APRESOLINE) injection 5 mg (not administered)  zolpidem (AMBIEN) tablet 5 mg (not  administered)  0.9 %  sodium chloride infusion (10 mL/hr Intravenous New Bag/Given 10/21/16 2357)  0.9 %  sodium chloride infusion ( Intravenous New Bag/Given 10/21/16 2231)  pantoprazole (PROTONIX) 80 mg in sodium chloride 0.9 % 100 mL IVPB (0 mg Intravenous Stopped 10/21/16 2319)     Initial Impression / Assessment and Plan / ED Course  I have reviewed the triage vital signs and the nursing notes.  Pertinent labs & imaging results that were available during my care of the patient were reviewed by me and considered in my medical decision making (see chart for details).     Patient with history of ESRD on dialysis and here with weakness. He has a nonfocal neurologic examination with generalized weakness. Hemoglobin is 6, down from 10 several days ago. Rectal exam with melanotic stool, heme positive. Suspect GI bleed. Will give the dose of Protonix. Given ESRD plan to admit for transfusion with dialysis.   Patient updated of findings studies and recommendation for admission for further treatment and is in agreement with plan.  Final Clinical Impressions(s) / ED Diagnoses   Final diagnoses:  None    New Prescriptions Current Discharge Medication List       Quintella Reichert, MD 10/22/16 1340

## 2016-10-22 NOTE — Consult Note (Signed)
[facility] Renal Consultation Note    Indication for Consultation:  Management of ESRD/hemodialysis, anemia, hypertension/volume, and secondary hyperparathyroidism. PCP:  HPI: [name] is a 71 y.o. male with ESRD, CAD, Type 2 DM, prostate cancer, Hx CVA, and Hx GI bleed (due to AVMs), combined HF who was admitted with generalized weakness and anemia.  [name] tells me that he was feeling fine until yesterday after dialysis when he noticed that his legs were very weak and "gave out". Brought to ED where found to have Hgb 6 with episode of melena. He denies prior blood in stool. He was transfused 1U PRBCs. Today, he thinks the weakness is better, but has not been out of bed yet. No CP or dyspnea. No abdominal pain, N/V/D or fever. GI consult is pending, currently on PPI drip. CXR clear.  Dialyzes MWF at [facility] in [facility]. Last HD was 8/6, he is due for HD today. Currently using new L AVF (16 or 17g needles, per notes), still has TDC in place.  Past Medical History:  Diagnosis Date  . 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 Anemia, iron deficiency 03/24/2011   a. Recurrent GI bleed, tx with periodic iron infusions.  . 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 AVM (arteriovenous malformation) of duodenum, acquired    egds in 01/2012, 04/2011  . BPH (benign prostatic hyperplasia)   . CAD (coronary artery disease)    a. Cath 09/2012: moderate borderline CAD in mid LAD/small diagonal branch, mild RCA stenosis, to be managed medically   . Chronic lower back pain   . Diabetic peripheral neuropathy ([place]) 2014   foot pain.  . Essential hypertension 02/01/2012  . Fatty liver    on CT of 11/2010  . GERD (gastroesophageal reflux disease)   . GI bleed    a. Recurrent GI bleed, tx with IV iron. b. Per heme notes - likely AVMs 04/2012 (tx with cauterization several months ago).  . Hematuria    a. Urology note scan from 07/2012: cystoscopy without evidence for bladder  lesion, only lateral hypertrophy of posterior urethra, bladder impression from BPH. b. Pt states he had "some tests" scheduled for later in July 2014.  . High cholesterol   . Hyperlipidemia 02/01/2012  . Hypertension   . Hypertensive urgency 07/06/2016  . 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 Orthostatic hypotension    a. Tx with florinef.  . Peripheral neuropathy   . Polyp, colonic    Colonoscopy 01/2012 "benign" polyp  . Prostate cancer ([place])   . Sleep apnea    "had mask; couldn't sleep in it" (09/20/2012)  . Smoker   . SOB (shortness of breath) 07/06/2016  . Stage III chronic kidney disease    a. Stage 3 (DM with complications ->CKD, peripheral neuropathy).  . Stroke ([place])    x 2  . Symptomatic anemia 04/03/2015  . Tinea pedis 05/24/2012  . Tobacco use 02/01/2012  . Type 2 diabetes, uncontrolled, with neuropathy ([place]) 02/01/2012  . Type II diabetes mellitus ([place])    a. Dx 1994, uncontrolled.    Past Surgical History:  Procedure Laterality Date  . ENTEROSCOPY N/A 03/01/2013   Procedure: ENTEROSCOPY;  Surgeon: Inda Castle, MD;  Location: [facility];  Service: Endoscopy;  Laterality: N/A;  . ENTEROSCOPY N/A 06/26/2016   Procedure: ENTEROSCOPY;  Surgeon: Milus Banister, MD;  Location: [facility];  Service: Endoscopy;  Laterality: N/A;  this is a push enteroscopy  . GIVENS CAPSULE STUDY  N/A 04/05/2015   Procedure: GIVENS CAPSULE STUDY;  Surgeon: Gatha Mayer, MD;  Location: [facility];  Service: Endoscopy;  Laterality: N/A;  . INGUINAL HERNIA REPAIR Right 2011  . LEFT HEART CATHETERIZATION WITH CORONARY ANGIOGRAM N/A 09/21/2012   Procedure: LEFT HEART CATHETERIZATION WITH CORONARY ANGIOGRAM;  Surgeon: Peter M Martinique, MD;  Location: [facility] CATH LAB;  Service: Cardiovascular;  Laterality: N/A;  . LUMBAR DISC SURGERY  1980's  . LYMPHADENECTOMY Bilateral 01/19/2013   Procedure: LYMPHADENECTOMY;  Surgeon: Bernestine Amass, MD;  Location: [facility];  Service: Urology;   Laterality: Bilateral;  . ROBOT ASSISTED LAPAROSCOPIC RADICAL PROSTATECTOMY N/A 01/19/2013   Procedure: ROBOTIC ASSISTED LAPAROSCOPIC RADICAL PROSTATECTOMY;  Surgeon: Bernestine Amass, MD;  Location: [facility];  Service: Urology;  Laterality: N/A;  . SHOULDER OPEN ROTATOR CUFF REPAIR Left 1980's   Family History  Problem Relation Age of Onset  . Stroke Father        Died at 78  . Diabetes Mother   . Heart attack Brother        Died at 9  . Diabetes Sister   . Stomach cancer Brother   . Heart attack Sister    Social History:  reports that he has quit smoking. His smoking use included Cigarettes. He started smoking about 48 years ago. He has a 30.00 pack-year smoking history. He has never used smokeless tobacco. He reports that he does not drink alcohol or use drugs.  ROS: As per HPI otherwise negative.  Physical Exam: Vitals:   10/21/16 2330 10/22/16 0002 10/22/16 0018 10/22/16 0407  BP: (!) 143/68 (!) 149/69 (!) 153/78 (!) 151/88  Pulse: 83 81 78 81  Resp: 12 15  14   Temp:  97.9 F (36.6 C) 97.8 F (36.6 C) 97.7 F (36.5 C)  TempSrc:  Oral Oral Oral  SpO2: 100% 100%  100%  Weight:  60.5 kg (133 lb 6.1 oz)    Height:  5\' 7"  (1.702 m)       General: Frail man, in no acute distress. Breathing comfortably on room air. Head: Normocephalic, atraumatic, sclera non-icteric, mucus membranes are moist. Neck: Supple without lymphadenopathy/masses. JVD not elevated. Lungs: Clear bilaterally to auscultation without wheezes, rales, or rhonchi. Breathing is unlabored. Heart: RRR with normal S1, S2. No murmurs, rubs, or gallops appreciated. Abdomen: Soft, non-tender, non-distended with normoactive bowel sounds.  Musculoskeletal:  Strength and tone appear normal for age. Lower extremities: No edema or ischemic changes, no open wounds. Neuro: Alert and oriented X 3. Moves all extremities spontaneously. Psych:  Responds to questions appropriately with a normal affect. Dialysis Access: TDC in R  chest, L RC AVF + thril  No Known Allergies Prior to Admission medications   Medication Sig Start Date End Date Taking? Authorizing Provider  Calcium Acetate 668 (169 Ca) MG TABS Take 1 tablet by mouth daily.   Yes [provider]  carvedilol (COREG) 3.125 MG tablet Take 1 tablet (3.125 mg total) by mouth 2 (two) times daily with a meal. 09/12/16  Yes Saguier, Percell Miller, PA-C  cetirizine (ZYRTEC ALLERGY) 10 MG tablet Take 1 tablet (10 mg total) by mouth daily. 09/18/16  Yes Avie Echevaria B, PA-C  clopidogrel (PLAVIX) 75 MG tablet TAKE 1 TABLET BY MOUTH EVERY DAY 09/10/16  Yes Saguier, Percell Miller, PA-C  gabapentin (NEURONTIN) 300 MG capsule Take 1 capsule (300 mg total) by mouth at bedtime. Patient taking differently: Take 300 mg by mouth 3 (three) times daily.  07/07/16  Yes Domenic Polite, MD  multivitamin (RENA-VIT) TABS tablet Take 1 tablet by mouth daily.   Yes [provider]  omeprazole (PRILOSEC) 40 MG capsule Take 1 capsule (40 mg total) by mouth daily. 06/19/16  Yes Saguier, Percell Miller, PA-C  rosuvastatin (CRESTOR) 40 MG tablet Take 1 tablet (40 mg total) by mouth daily. 06/19/16  Yes Saguier, Percell Miller, PA-C  venlafaxine XR (EFFEXOR-XR) 37.5 MG 24 hr capsule Take 1 capsule (37.5 mg total) by mouth daily with breakfast. 10/07/16  Yes Saguier, Percell Miller, PA-C   Current Facility-Administered Medications  Medication Dose Route Frequency Provider Last Rate Last Dose  . acetaminophen (TYLENOL) tablet 650 mg  650 mg Oral Q6H PRN Ivor Costa, MD       Or  . acetaminophen (TYLENOL) suppository 650 mg  650 mg Rectal Q6H PRN Ivor Costa, MD      . amLODipine (NORVASC) tablet 5 mg  5 mg Oral Daily Ivor Costa, MD      . benzonatate (TESSALON) capsule 100 mg  100 mg Oral TID PRN Ivor Costa, MD      . calcium acetate (PHOSLO) capsule 667 mg  667 mg Oral QAC supper Ivor Costa, MD      . carvedilol (COREG) tablet 3.125 mg  3.125 mg Oral BID WC Ivor Costa, MD      . fluticasone (FLONASE) 50 MCG/ACT nasal  spray 2 spray  2 spray Each Nare Daily Ivor Costa, MD      . gabapentin (NEURONTIN) capsule 300 mg  300 mg Oral QHS Ivor Costa, MD   300 mg at 10/22/16 0037  . hydrALAZINE (APRESOLINE) injection 5 mg  5 mg Intravenous Q2H PRN Ivor Costa, MD      . loratadine (CLARITIN) tablet 10 mg  10 mg Oral Daily Ivor Costa, MD      . multivitamin (RENA-VIT) tablet 1 tablet  1 tablet Oral QHS Ivor Costa, MD      . nicotine (NICODERM CQ - dosed in mg/24 hours) patch 21 mg  21 mg Transdermal Daily Ivor Costa, MD      . ondansetron [facility]) tablet 4 mg  4 mg Oral Q6H PRN Ivor Costa, MD       Or  . ondansetron (ZOFRAN) injection 4 mg  4 mg Intravenous Q6H PRN Ivor Costa, MD      . pantoprazole (PROTONIX) 80 mg in sodium chloride 0.9 % 250 mL (0.32 mg/mL) infusion  8 mg/hr Intravenous Continuous Ivor Costa, MD 25 mL/hr at 10/22/16 0207 8 mg/hr at 10/22/16 0207  . [START ON 10/25/2016] pantoprazole (PROTONIX) injection 40 mg  40 mg Intravenous Q12H Ivor Costa, MD      . rosuvastatin (CRESTOR) tablet 40 mg  40 mg Oral Daily Ivor Costa, MD      . venlafaxine XR (EFFEXOR-XR) 24 hr capsule 37.5 mg  37.5 mg Oral Q breakfast Ivor Costa, MD      . zolpidem (AMBIEN) tablet 5 mg  5 mg Oral QHS PRN Ivor Costa, MD       Labs: Basic Metabolic Panel:  Recent Labs Lab 10/16/16 1257 10/21/16 2013 10/22/16 0208  NA 137 138 136  K 4.3 4.4 4.5  CL 97* 103 101  CO2 31 27 25   GLUCOSE 149* 119* 168*  BUN 26* 37* 40*  CREATININE 8.4* 8.27* 8.65*  CALCIUM 8.0 7.5* 7.6*   Liver Function Tests:  Recent Labs Lab 10/16/16 1257 10/21/16 2013  AST 16 11*  ALT 15 10*  ALKPHOS 98* 70  BILITOT 0.50 0.5  PROT 7.2  6.1*  ALBUMIN 2.9* 2.9*   CBC:  Recent Labs Lab 10/16/16 1257 10/21/16 2013 10/22/16 0208  WBC 4.9 7.7 6.8  NEUTROABS 2.8 6.0  --   HGB 10.1* 6.0* 7.5*  HCT 29.5* 18.0* 22.9*  MCV 93 90.9 89.1  PLT 256 237 252   CBG:  Recent Labs Lab 10/21/16 1920 10/22/16 0544  GLUCAP 121* 84   Iron Studies:   Recent Labs  10/22/16 0208  IRON 40*  TIBC 188*  FERRITIN 275   Studies/Results: Dg Chest 2 View  Result Date: 10/21/2016 CLINICAL DATA:  Hypertension.  Weakness. EXAM: CHEST  2 VIEW COMPARISON:  August 19, 2016 FINDINGS: Central catheter tip is in the superior vena cava near the cavoatrial junction. No pneumothorax. There is no edema or consolidation. The heart size and pulmonary vascularity are normal. No adenopathy. No bone lesions. IMPRESSION: Central catheter as described without pneumothorax. No edema or consolidation. Electronically Signed   By: Lowella Grip III M.D.   On: 10/21/2016 20:57    Dialysis Orders:  MWF at [facility] in [facility] 4hr, 200 dialyzer, BFR 350, DFR 700, EDW 135lb (61.4kg), 2K/3Ca bath, no heparin - Hectoral 2.9mcg IV q HD - Parsabiv 2.5mg  IV q HD - HB status: pending.  Assessment/Plan: 1.  Symptomatic anemia/GI bleed: s/p 1u PRBC 8/7. GI consult pending. Hgb 7.5 today, tsat 21%, B12/folate ok. Will plan 2.  ESRD:  Will continue MWF schedule, will dialyze today. No heparin. 3.  Hypertension/volume: BP slightly high, no edema. 0.5L UF today if tolerated. Resume home meds after HD. 4.  Metabolic bone disease: Corr Ca ok. Continue Hectoral. Parsabiv not on formulary. NPO currently, will resume binders once eating (Phoslo). 5.  Nutrition: Alb low. Will add prot supps once eating. 6.  Hx stroke: Plavix on hold. Per primary. 7. Type 2 DM: Per primary.  Veneta Penton, PA-C 10/22/2016, 10:34 AM  [facility] Pager: [phone]  Pt seen, examined and agree w A/P as above.  Kelly Splinter MD [employer] pager [phone]   10/22/2016, 2:11 PM

## 2016-10-22 NOTE — Progress Notes (Signed)
PROGRESS NOTE    [name]  DQQ:[id number] DOB: May 14, 1945 DOA: 10/21/2016 PCP: Mackie Pai, PA-C  Brief Narrative:[name] is a 71 y.o. male with medical history significant of hypertension, hyperlipidemia, diet-controlled diabetes, stroke, GERD, prostate cancer, BPH, GI bleeding, AVM, CAD, anemia, chronic combined systolic and diastolic CHF, who presented with generalized weakness, melena and fall, Hb down to 6, melena noted in ER.   Assessment & Plan:    Acute on chronic blood loss anemia -with melena now -h/o small bowel AVMs s/p cautery, last in 4/18 -s/p 2 units PRBC -[facility] consulted -plavix held    ESRD on HD -renal consulted needs HD today    H/o CVA  -hold plavix, pending GI workup   HTN -continue coreg and amlodipine -BP stable and no active bleeding noted at this time   CAD -stable, plavix on hold -continue Coreg and statin  DVT prophylaxis: SCDs Code Status: Full Code Family Communication: None at bedside Disposition Plan: Home pending workup  Consultants:   [facility]  Renal     Subjective: Feels ok after blood, denies any melena or hematemesis since admission  Objective: Vitals:   10/22/16 0002 10/22/16 0018 10/22/16 0407 10/22/16 1149  BP: (!) 149/69 (!) 153/78 (!) 151/88   Pulse: 81 78 81   Resp: 15  14   Temp: 97.9 F (36.6 C) 97.8 F (36.6 C) 97.7 F (36.5 C) 98.7 F (37.1 C)  TempSrc: Oral Oral Oral Oral  SpO2: 100%  100%   Weight: 60.5 kg (133 lb 6.1 oz)     Height: 5\' 7"  (1.702 m)       Intake/Output Summary (Last 24 hours) at 10/22/16 1422 Last data filed at 10/22/16 0900  Gross per 24 hour  Intake              100 ml  Output              175 ml  Net              -75 ml   Filed Weights   10/21/16 1913 10/22/16 0002  Weight: 59 kg (130 lb) 60.5 kg (133 lb 6.1 oz)    Examination:  General exam: Appears calm and comfortable  Respiratory system: Clear to auscultation. Respiratory effort normal. Cardiovascular  system: S1 & S2 heard, RRR. No JVD, murmurs, rubs, gallops or clicks. No pedal edema. Gastrointestinal system: Abdomen is nondistended, soft and nontender. Normal bowel sounds heard. Central nervous system: Alert and oriented. No focal neurological deficits. Extremities: Symmetric 5 x 5 power. Skin: No rashes, lesions or ulcers Psychiatry: Judgement and insight appear normal. Mood & affect appropriate.     Data Reviewed:   CBC:  Recent Labs Lab 10/16/16 1257 10/21/16 2013 10/22/16 0208 10/22/16 0956  WBC 4.9 7.7 6.8 5.1  NEUTROABS 2.8 6.0  --   --   HGB 10.1* 6.0* 7.5* 7.9*  HCT 29.5* 18.0* 22.9* 23.5*  MCV 93 90.9 89.1 88.7  PLT 256 237 252 194   Basic Metabolic Panel:  Recent Labs Lab 10/16/16 1257 10/21/16 2013 10/22/16 0208  NA 137 138 136  K 4.3 4.4 4.5  CL 97* 103 101  CO2 31 27 25   GLUCOSE 149* 119* 168*  BUN 26* 37* 40*  CREATININE 8.4* 8.27* 8.65*  CALCIUM 8.0 7.5* 7.6*   GFR: Estimated Creatinine Clearance: 6.9 mL/min (A) (by C-G formula based on SCr of 8.65 mg/dL (H)). Liver Function Tests:  Recent Labs Lab 10/16/16  1257 10/21/16 2013  AST 16 11*  ALT 15 10*  ALKPHOS 98* 70  BILITOT 0.50 0.5  PROT 7.2 6.1*  ALBUMIN 2.9* 2.9*   No results for input(s): LIPASE, AMYLASE in the last 168 hours. No results for input(s): AMMONIA in the last 168 hours. Coagulation Profile:  Recent Labs Lab 10/21/16 2013  INR 1.07   Cardiac Enzymes: No results for input(s): CKTOTAL, CKMB, CKMBINDEX, TROPONINI in the last 168 hours. BNP (last 3 results)  Recent Labs  07/09/16 1438 07/31/16 1104 08/19/16 1155  PROBNP 1,044.0* 343.0* 349.0*   HbA1C: No results for input(s): HGBA1C in the last 72 hours. CBG:  Recent Labs Lab 10/21/16 1920 10/22/16 0544  GLUCAP 121* 84   Lipid Profile: No results for input(s): CHOL, HDL, LDLCALC, TRIG, CHOLHDL, LDLDIRECT in the last 72 hours. Thyroid Function Tests: No results for input(s): TSH, T4TOTAL, FREET4,  T3FREE, THYROIDAB in the last 72 hours. Anemia Panel:  Recent Labs  10/22/16 0208  VITAMINB12 821  FOLATE 49.8  FERRITIN 275  TIBC 188*  IRON 40*  RETICCTPCT 2.0   Urine analysis:    Component Value Date/Time   COLORURINE YELLOW 04/01/2016 0939   APPEARANCEUR CLEAR 04/01/2016 0939   LABSPEC >=1.030 (A) 04/01/2016 0939   PHURINE 5.5 04/01/2016 0939   GLUCOSEU NEGATIVE 04/01/2016 0939   HGBUR MODERATE (A) 04/01/2016 0939   BILIRUBINUR SMALL (A) 04/01/2016 0939   BILIRUBINUR small 06/21/2012 1041   KETONESUR TRACE (A) 04/01/2016 0939   PROTEINUR >300 (A) 12/25/2015 1028   UROBILINOGEN 0.2 04/01/2016 0939   NITRITE NEGATIVE 04/01/2016 0939   LEUKOCYTESUR NEGATIVE 04/01/2016 0939   Sepsis Labs: @LABRCNTIP (procalcitonin:4,lacticidven:4)  )No results found for this or any previous visit (from the past 240 hour(s)).       Radiology Studies: Dg Chest 2 View  Result Date: 10/21/2016 CLINICAL DATA:  Hypertension.  Weakness. EXAM: CHEST  2 VIEW COMPARISON:  August 19, 2016 FINDINGS: Central catheter tip is in the superior vena cava near the cavoatrial junction. No pneumothorax. There is no edema or consolidation. The heart size and pulmonary vascularity are normal. No adenopathy. No bone lesions. IMPRESSION: Central catheter as described without pneumothorax. No edema or consolidation. Electronically Signed   By: Lowella Grip III M.D.   On: 10/21/2016 20:57        Scheduled Meds: . amLODipine  5 mg Oral Daily  . calcium acetate  667 mg Oral QAC supper  . carvedilol  3.125 mg Oral BID WC  . doxercalciferol  2.5 mcg Intravenous Q M,W,F-HD  . fluticasone  2 spray Each Nare Daily  . gabapentin  300 mg Oral QHS  . loratadine  10 mg Oral Daily  . multivitamin  1 tablet Oral QHS  . nicotine  21 mg Transdermal Daily  . rosuvastatin  40 mg Oral Daily  . venlafaxine XR  37.5 mg Oral Q breakfast   Continuous Infusions:   LOS: 1 day    Time spent: 75min    Domenic Polite, MD [facility] Pager [phone]  If 7PM-7AM, please contact night-coverage [email or web address] Password [email or web address] 10/22/2016, 2:22 PM

## 2016-10-22 NOTE — Progress Notes (Signed)
Called Dialysis, spoke with Melissa in dialysis.regarding time  patient going to dialysis; No definitive time given, only sometime tonight. Patient is aware.

## 2016-10-22 NOTE — Consult Note (Signed)
[facility] Consult: 10:23 AM 10/22/2016  LOS: 1 day    Referring Provider: Dr Broadus John  Primary Care Physician:  Harvie Heck, Iris Pert in [facility] Primary Gastroenterologist: [facility], Dr Rolm Bookbinder.  Previously Dr Valli Glance.      Reason for Consultation:  IDA, GIB.  Hx SB AVMs   HPI: [name] is a 71 y.o. male.  PMH ESRD on HD MWF for about 18 months.  Prostate cancer, Gleason grade 7, prostatectmy 01/2013.  Anemia of chronic disease and from GI blood loss.  s/p multiple blood transfusions.  Previously treated with iron infusions (latest was 08/2015) supervised by Dr Marin Olp at [place] [facility].  On ASA and Plavix for hx multiple CVAs, latest in 05/2015 "punctate ischemic stroke at right thalamus".   Chronic gait instability.  Elevated Troponin during 05/2016 admission due to demand ischemia.  Apparently was not taking his meds. Type 2 DM, currently on no meds for this.    Aranesp discontinued in past due to non-hemorrhagic stoke. CAD.  S/p inguinal hernia repair.     Several years of transfusion requiring anemia. FOBT + black stools intermittently.   2014 Capsule endo: SB AVMs.  2014 Enteroscopy: with cauterization of "a few SB AVMs"  02/2015 Colonoscopy: 2 adenomatous polyps, internal hemorrhoids.  03/2015 Capsule endoscopy: several small, non-bleeding AVMs scattered from proximal to distal SB.  Plan was continue iron infusion, prn transfusion, monitor Hgb.  04/10/2016 Enteroscopy 30 cm into Jejunum. APC cauterization of 49mm prox jejunal AVM lesion.  This oozed blood and needed additional APC following initial APC.  7 mm jejunal polyp biopsied: benign SB mucosa.  06/14/16 PRBC x 1 during CVA admission. Periodic blood transfusions and parenteral iron with HD.  Transfused PRBC x 4 in 06/2016 for Hgb 4.9.  Stool FOBT  +.  06/26/16 Enteroscopy, Dr Ardis Hughs.  APC'd 2 AVMs in prox jejunum.  One of these, larger, bled with minor scope trauma and was felt likely source of bleed.  08/21/2016 ROV with stroke MD says to continue Plavix for CVA prevention.    10/16/17 ROV with Dr Marin Olp. MD states "multifactorial anemia".  Ordered bone scan for low back pain and changes in spinal x ray though doubful of metastatic prostate CA.  Noted 20 # weight loss since 11/2015.    Stools always black, soft, formed.  No bleeding PR.  No abd pain.  No anorexia.  Pt says he has lost weight but weight of 138 to 141# in 06/2016 and 128 to 133# now.   No dysphagia.   Feeling weaker and lightheaded, no presyncope, over last few days.  Legs gave out and he fell to ground without LOC and came to ED.  Hypotensive.   Hgb 6.0.  Was 10.1 on 8/2.  S/p PRBC x 1.  hgb now 7.9 Coags, platelets ok.  NPO.  MD started PPI drip.         Past Medical History:  Diagnosis Date  . 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 Anemia, iron deficiency 03/24/2011   a. Recurrent GI bleed,  tx with periodic iron infusions.  . 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 AVM (arteriovenous malformation) of duodenum, acquired    egds in 01/2012, 04/2011  . BPH (benign prostatic hyperplasia)   . CAD (coronary artery disease)    a. Cath 09/2012: moderate borderline CAD in mid LAD/small diagonal branch, mild RCA stenosis, to be managed medically   . Chronic lower back pain   . Diabetic peripheral neuropathy ([place]) 2014   foot pain.  . Essential hypertension 02/01/2012  . Fatty liver    on CT of 11/2010  . GERD (gastroesophageal reflux disease)   . GI bleed    a. Recurrent GI bleed, tx with IV iron. b. Per heme notes - likely AVMs 04/2012 (tx with cauterization several months ago).  . Hematuria    a. Urology note scan from 07/2012: cystoscopy without evidence for bladder lesion, only lateral hypertrophy of posterior urethra, bladder impression from BPH. b. Pt states he had  "some tests" scheduled for later in July 2014.  . High cholesterol   . Hyperlipidemia 02/01/2012  . Hypertension   . Hypertensive urgency 07/06/2016  . 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 Orthostatic hypotension    a. Tx with florinef.  . Peripheral neuropathy   . Polyp, colonic    Colonoscopy 01/2012 "benign" polyp  . Prostate cancer ([place])   . Sleep apnea    "had mask; couldn't sleep in it" (09/20/2012)  . Smoker   . SOB (shortness of breath) 07/06/2016  . Stage III chronic kidney disease    a. Stage 3 (DM with complications ->CKD, peripheral neuropathy).  . Stroke ([place])    x 2  . Symptomatic anemia 04/03/2015  . Tinea pedis 05/24/2012  . Tobacco use 02/01/2012  . Type 2 diabetes, uncontrolled, with neuropathy ([place]) 02/01/2012  . Type II diabetes mellitus ([place])    a. Dx 1994, uncontrolled.     Past Surgical History:  Procedure Laterality Date  . ENTEROSCOPY N/A 03/01/2013   Procedure: ENTEROSCOPY;  Surgeon: Inda Castle, MD;  Location: [facility];  Service: Endoscopy;  Laterality: N/A;  . ENTEROSCOPY N/A 06/26/2016   Procedure: ENTEROSCOPY;  Surgeon: Milus Banister, MD;  Location: [facility];  Service: Endoscopy;  Laterality: N/A;  this is a push enteroscopy  . GIVENS CAPSULE STUDY N/A 04/05/2015   Procedure: GIVENS CAPSULE STUDY;  Surgeon: Gatha Mayer, MD;  Location: [facility];  Service: Endoscopy;  Laterality: N/A;  . INGUINAL HERNIA REPAIR Right 2011  . LEFT HEART CATHETERIZATION WITH CORONARY ANGIOGRAM N/A 09/21/2012   Procedure: LEFT HEART CATHETERIZATION WITH CORONARY ANGIOGRAM;  Surgeon: Peter M Martinique, MD;  Location: [facility] CATH [facility];  Service: Cardiovascular;  Laterality: N/A;  . LUMBAR DISC SURGERY  1980's  . LYMPHADENECTOMY Bilateral 01/19/2013   Procedure: LYMPHADENECTOMY;  Surgeon: Bernestine Amass, MD;  Location: [facility];  Service: Urology;  Laterality: Bilateral;  . ROBOT ASSISTED LAPAROSCOPIC RADICAL PROSTATECTOMY N/A 01/19/2013   Procedure:  ROBOTIC ASSISTED LAPAROSCOPIC RADICAL PROSTATECTOMY;  Surgeon: Bernestine Amass, MD;  Location: [facility];  Service: Urology;  Laterality: N/A;  . SHOULDER OPEN ROTATOR CUFF REPAIR Left 1980's    Prior to Admission medications   Medication Sig Start Date End Date Taking? Authorizing Provider  Calcium Acetate 668 (169 Ca) MG TABS Take 1 tablet by mouth daily.   Yes [provider]  carvedilol (COREG) 3.125 MG tablet Take 1 tablet (3.125 mg total) by mouth 2 (two) times daily with a meal. 09/12/16  Yes  Saguier, Percell Miller, PA-C  cetirizine (ZYRTEC ALLERGY) 10 MG tablet Take 1 tablet (10 mg total) by mouth daily. 09/18/16  Yes Avie Echevaria B, PA-C  clopidogrel (PLAVIX) 75 MG tablet TAKE 1 TABLET BY MOUTH EVERY DAY 09/10/16  Yes Saguier, Percell Miller, PA-C  gabapentin (NEURONTIN) 300 MG capsule Take 1 capsule (300 mg total) by mouth at bedtime. Patient taking differently: Take 300 mg by mouth 3 (three) times daily.  07/07/16  Yes Domenic Polite, MD  multivitamin (RENA-VIT) TABS tablet Take 1 tablet by mouth daily.   Yes [provider]  omeprazole (PRILOSEC) 40 MG capsule Take 1 capsule (40 mg total) by mouth daily. 06/19/16  Yes Saguier, Percell Miller, PA-C  rosuvastatin (CRESTOR) 40 MG tablet Take 1 tablet (40 mg total) by mouth daily. 06/19/16  Yes Saguier, Percell Miller, PA-C  venlafaxine XR (EFFEXOR-XR) 37.5 MG 24 hr capsule Take 1 capsule (37.5 mg total) by mouth daily with breakfast. 10/07/16  Yes Saguier, Percell Miller, PA-C    Scheduled Meds: . amLODipine  5 mg Oral Daily  . calcium acetate  667 mg Oral QAC supper  . carvedilol  3.125 mg Oral BID WC  . fluticasone  2 spray Each Nare Daily  . gabapentin  300 mg Oral QHS  . loratadine  10 mg Oral Daily  . multivitamin  1 tablet Oral QHS  . nicotine  21 mg Transdermal Daily  . [START ON 10/25/2016] pantoprazole  40 mg Intravenous Q12H  . rosuvastatin  40 mg Oral Daily  . venlafaxine XR  37.5 mg Oral Q breakfast   Infusions: . pantoprozole (PROTONIX)  infusion 8 mg/hr (10/22/16 0207)   PRN Meds: acetaminophen **OR** acetaminophen, benzonatate, hydrALAZINE, ondansetron **OR** ondansetron (ZOFRAN) IV, zolpidem   Allergies as of 10/21/2016  . (No Known Allergies)    Family History  Problem Relation Age of Onset  . Stroke Father        Died at 3  . Diabetes Mother   . Heart attack Brother        Died at 65  . Diabetes Sister   . Stomach cancer Brother   . Heart attack Sister     Social History   Social History  . Marital status: Married    Spouse name: N/A  . Number of children: 2  . Years of education: N/A   Occupational History  . taken out of work due to back    Social History Main Topics  . Smoking status: Former Smoker    Packs/day: 1.00    Years: 30.00    Types: Cigarettes    Start date: 04/15/1968  . Smokeless tobacco: Never Used  . Alcohol use No     Comment: 09/20/2012 "Used to; stopped ~ 2009; never had problem w/it"  . Drug use: No  . Sexual activity: No   Other Topics Concern  . Not on file   Social History Narrative   Regular exercise: rides horses   Caffeine use: occasionally    REVIEW OF SYSTEMS: Constitutional:  Weakness in the last few days. ENT:  No nose bleeds Pulm:  No cough. No dyspnea. CV:  No palpitations, no LE edema.  No chest pain.  GU:  No hematuria, no frequency GI:  Per HPI Heme:  Per HPI.  Patient denies unusual bleeding from any orifice or excessive bruising.   Transfusions:  See HPI Neuro:  No headaches, no peripheral tingling or numbness Derm:  No itching, no rash or sores.  No tattoos Endocrine:  No sweats or  chills.  No polyuria or dysuria Immunization:  Not queried.   Travel:  None beyond local counties in last few months.    PHYSICAL EXAM: Vital signs in last 24 hours: Vitals:   10/22/16 0018 10/22/16 0407  BP: (!) 153/78 (!) 151/88  Pulse: 78 81  Resp:  14  Temp: 97.8 F (36.6 C) 97.7 F (36.5 C)   Wt Readings from Last 3 Encounters:  10/22/16 60.5 kg  (133 lb 6.1 oz)  10/16/16 58.1 kg (128 lb)  10/07/16 62.1 kg (137 lb)    General: Pleasant, comfortable, not ill-appearing. He has a slight speech impediment. Head:  No asymmetry or facial swelling. No signs of head trauma.  Eyes:  No scleral icterus. Conjunctiva is pale. EOMI. Ears:  Not hard of hearing.  Nose:  No discharge or congestion Mouth:  Tongue midline. Oral mucosa pink, moist, clear. Very few remaining teeth. Neck:  No JVD, no thyromegaly, no masses. Lungs:  No trouble breathing or cough. Lungs clear to auscultation bilaterally. Good breath sounds. Heart: RRR. No MRG. S1, S2 present. Abdomen:  Soft. Nontender. No hernias, masses, organomegaly. Bowel sounds active..   Rectal: Deferred the rectal exam.  FOBT+ in ED yesterday.   Musc/Skeltl: No joint swelling, redness or gross deformity. Extremities:  No CCE.  Neurologic:  Alert. Oriented times 3. Moves all 4 limbs. Strength not tested. No obvious hemi-weakness or deficits. No tremor. Skin:  No rashes or sores. Tattoos:  None Nodes:  No cervical adenopathy.   Psych:  Pleasant, calm, cooperative.  Intake/Output from previous day: 08/07 0701 - 08/08 0700 In: 100 [IV Piggyback:100] Out: 0  Intake/Output this shift: Total I/O In: -  Out: 175 [Urine:175]  LAB RESULTS:  Recent Labs  10/21/16 2013 10/22/16 0208  WBC 7.7 6.8  HGB 6.0* 7.5*  HCT 18.0* 22.9*  PLT 237 252   BMET Lab Results  Component Value Date   NA 136 10/22/2016   NA 138 10/21/2016   NA 137 10/16/2016   K 4.5 10/22/2016   K 4.4 10/21/2016   K 4.3 10/16/2016   CL 101 10/22/2016   CL 103 10/21/2016   CL 97 (L) 10/16/2016   CO2 25 10/22/2016   CO2 27 10/21/2016   CO2 31 10/16/2016   GLUCOSE 168 (H) 10/22/2016   GLUCOSE 119 (H) 10/21/2016   GLUCOSE 149 (H) 10/16/2016   BUN 40 (H) 10/22/2016   BUN 37 (H) 10/21/2016   BUN 26 (H) 10/16/2016   CREATININE 8.65 (H) 10/22/2016   CREATININE 8.27 (H) 10/21/2016   CREATININE 8.4 (HH) 10/16/2016    CALCIUM 7.6 (L) 10/22/2016   CALCIUM 7.5 (L) 10/21/2016   CALCIUM 8.0 10/16/2016   LFT  Recent Labs  10/21/16 2013  PROT 6.1*  ALBUMIN 2.9*  AST 11*  ALT 10*  ALKPHOS 70  BILITOT 0.5   PT/INR Lab Results  Component Value Date   INR 1.07 10/21/2016   INR 1.08 06/23/2016   INR 1.06 06/13/2016    RADIOLOGY STUDIES: Dg Chest 2 View  Result Date: 10/21/2016 CLINICAL DATA:  Hypertension.  Weakness. EXAM: CHEST  2 VIEW COMPARISON:  August 19, 2016 FINDINGS: Central catheter tip is in the superior vena cava near the cavoatrial junction. No pneumothorax. There is no edema or consolidation. The heart size and pulmonary vascularity are normal. No adenopathy. No bone lesions. IMPRESSION: Central catheter as described without pneumothorax. No edema or consolidation. Electronically Signed   By: Lowella Grip III M.D.   On:  10/21/2016 20:57     IMPRESSION:   *  Recurrent GIB and associated acute on chronic anemia.  At enteroscopy's in January and April of this year he had successful cauterization of jejunal AVMs. Suspect recurrent bleeding from small bowel AVMs. S/p PRBC x 1 with good response.    *  ESRD.  HD today.    *  Hx CVAs.  On Plavix, last dose 8/7.     PLAN:     *  Hgb and Hct in AM.    *  Feed today, renal diet.  *  enterscopy tomorrow.    *  Only needs 1 x daily oral PPI.  So I discontinued the Protonix drip.     Azucena Freed  10/22/2016, 10:23 AM Pager: [phone]    [facility] Attending   I have taken an interval history, reviewed the chart and examined the patient. I agree with the Advanced Practitioner's note, impression and recommendations.    Gatha Mayer, MD, [facility] [phone] (pager) 10/22/2016 6:43 PM

## 2016-10-23 ENCOUNTER — Encounter (HOSPITAL_COMMUNITY): Payer: Self-pay

## 2016-10-23 ENCOUNTER — Encounter (HOSPITAL_COMMUNITY)
Admission: EM | Disposition: A | Discharge status: Home Health | Payer: Self-pay | Source: Home / Self Care | Attending: Internal Medicine

## 2016-10-23 ENCOUNTER — Inpatient Hospital Stay (HOSPITAL_COMMUNITY): Payer: Medicare Other | Admitting: Anesthesiology

## 2016-10-23 DIAGNOSIS — Q2733 Arteriovenous malformation of digestive system vessel: Secondary | ICD-10-CM

## 2016-10-23 DIAGNOSIS — K5520 Angiodysplasia of colon without hemorrhage: Secondary | ICD-10-CM

## 2016-10-23 HISTORY — PX: ENTEROSCOPY: SHX5533

## 2016-10-23 LAB — RENAL FUNCTION PANEL
ANION GAP: 8 (ref 5–15)
Albumin: 2.6 g/dL — ABNORMAL LOW (ref 3.5–5.0)
BUN: 44 mg/dL — ABNORMAL HIGH (ref 6–20)
CALCIUM: 7.4 mg/dL — AB (ref 8.9–10.3)
CO2: 26 mmol/L (ref 22–32)
Chloride: 104 mmol/L (ref 101–111)
Creatinine, Ser: 9.45 mg/dL — ABNORMAL HIGH (ref 0.61–1.24)
GFR calc Af Amer: 6 mL/min — ABNORMAL LOW (ref >60)
GFR calc non Af Amer: 5 mL/min — ABNORMAL LOW (ref >60)
GLUCOSE: 82 mg/dL (ref 65–99)
Phosphorus: 4.4 mg/dL (ref 2.5–4.6)
Potassium: 4.4 mmol/L (ref 3.5–5.1)
SODIUM: 138 mmol/L (ref 135–145)

## 2016-10-23 LAB — GLUCOSE, CAPILLARY
GLUCOSE-CAPILLARY: 118 mg/dL — AB (ref 65–99)
GLUCOSE-CAPILLARY: 119 mg/dL — AB (ref 65–99)
Glucose-Capillary: 79 mg/dL (ref 65–99)
Glucose-Capillary: 67 mg/dL (ref 65–99)

## 2016-10-23 LAB — CBC
HCT: 21.3 % — ABNORMAL LOW (ref 39.0–52.0)
HEMOGLOBIN: 7.2 g/dL — AB (ref 13.0–17.0)
MCH: 29.8 pg (ref 26.0–34.0)
MCHC: 33.8 g/dL (ref 30.0–36.0)
MCV: 88 fL (ref 78.0–100.0)
Platelets: 248 10*3/uL (ref 150–400)
RBC: 2.42 MIL/uL — ABNORMAL LOW (ref 4.22–5.81)
RDW: 16.3 % — AB (ref 11.5–15.5)
WBC: 4.2 10*3/uL (ref 4.0–10.5)

## 2016-10-23 LAB — PREPARE RBC (CROSSMATCH)

## 2016-10-23 SURGERY — ENTEROSCOPY
Anesthesia: Monitor Anesthesia Care

## 2016-10-23 MED ORDER — SODIUM CHLORIDE 0.9 % IV SOLN: 100.0000 mL | INTRAVENOUS | Status: DC | PRN

## 2016-10-23 MED ORDER — PROPOFOL 500 MG/50ML IV EMUL
INTRAVENOUS | Status: DC | PRN
  Administered 2016-10-23: 75 ug/kg/min via INTRAVENOUS

## 2016-10-23 MED ORDER — LIDOCAINE HCL (PF) 1 % IJ SOLN: 5.0000 mL | INTRAMUSCULAR | Status: DC | PRN

## 2016-10-23 MED ORDER — LIDOCAINE-PRILOCAINE 2.5-2.5 % EX CREA: 1.0000 "application " | TOPICAL_CREAM | CUTANEOUS | Status: DC | PRN

## 2016-10-23 MED ORDER — PENTAFLUOROPROP-TETRAFLUOROETH EX AERO: 1.0000 "application " | INHALATION_SPRAY | CUTANEOUS | Status: DC | PRN

## 2016-10-23 MED ORDER — FENTANYL CITRATE (PF) 100 MCG/2ML IJ SOLN
INTRAMUSCULAR | Status: DC | PRN
  Administered 2016-10-23: 100 ug via INTRAVENOUS

## 2016-10-23 MED ORDER — DEXTROSE 50 % IV SOLN
25.0000 mL | Freq: Once | INTRAVENOUS | Status: AC
  Administered 2016-10-23: 25 mL via INTRAVENOUS

## 2016-10-23 MED ORDER — ALTEPLASE 2 MG IJ SOLR: 2.0000 mg | Freq: Once | INTRAMUSCULAR | Status: DC | PRN

## 2016-10-23 MED ORDER — PHENYLEPHRINE HCL 10 MG/ML IJ SOLN
INTRAMUSCULAR | Status: DC | PRN
  Administered 2016-10-23: 80 ug via INTRAVENOUS

## 2016-10-23 MED ORDER — DOXERCALCIFEROL 4 MCG/2ML IV SOLN
INTRAVENOUS | Status: AC
  Administered 2016-10-23: 2.5 ug via INTRAVENOUS
  Filled 2016-10-23: qty 2

## 2016-10-23 MED ORDER — SODIUM CHLORIDE 0.9 % IV SOLN
100.0000 mL | INTRAVENOUS | Status: DC | PRN
  Administered 2016-10-23 (×2): via INTRAVENOUS

## 2016-10-23 MED ORDER — DEXTROSE 50 % IV SOLN
INTRAVENOUS | Status: AC
  Filled 2016-10-23: qty 50

## 2016-10-23 MED ORDER — PROPOFOL 10 MG/ML IV BOLUS
INTRAVENOUS | Status: DC | PRN
  Administered 2016-10-23: 20 mg via INTRAVENOUS
  Administered 2016-10-23: 30 mg via INTRAVENOUS

## 2016-10-23 MED ORDER — HEPARIN SODIUM (PORCINE) 1000 UNIT/ML DIALYSIS
1000.0000 [IU] | INTRAMUSCULAR | Status: DC | PRN
  Administered 2016-10-23: 1000 [IU] via INTRAVENOUS_CENTRAL

## 2016-10-23 MED ORDER — SODIUM CHLORIDE 0.9 % IV SOLN
Freq: Once | INTRAVENOUS | Status: AC
  Administered 2016-10-23: 08:00:00 via INTRAVENOUS

## 2016-10-23 NOTE — Interval H&P Note (Signed)
History and Physical Interval Note:  10/23/2016 1:12 PM  [name]  has presented today for surgery, with the diagnosis of GI bleed. Recurrent acute on chronic anemia. History small bowel AVMs.  The various methods of treatment have been discussed with the patient and family. After consideration of risks, benefits and other options for treatment, the patient has consented to  Procedure(s): ENTEROSCOPY (N/A) as a surgical intervention .  The patient's history has been reviewed, patient examined, no change in status, stable for surgery.  I have reviewed the patient's chart and labs.  Questions were answered to the patient's satisfaction.     Silvano Rusk

## 2016-10-23 NOTE — Progress Notes (Signed)
[facility] Progress Note  Subjective: stable  Vitals:   10/23/16 0641 10/23/16 0831 10/23/16 0915 10/23/16 0930  BP: 140/70 (!) 160/77 (!) 160/77   Pulse: 77 76 77 77  Resp: 18 18 20 20   Temp: 98.7 F (37.1 C)  98.1 F (36.7 C) 98.3 F (36.8 C)  TempSrc: Oral  Oral Oral  SpO2: 100% 99% 100% 100%  Weight:      Height:        Inpatient medications: . amLODipine  5 mg Oral Daily  . calcium acetate  667 mg Oral QAC supper  . carvedilol  3.125 mg Oral BID WC  . doxercalciferol  2.5 mcg Intravenous Q M,W,F-HD  . fluticasone  2 spray Each Nare Daily  . gabapentin  300 mg Oral QHS  . loratadine  10 mg Oral Daily  . multivitamin  1 tablet Oral QHS  . nicotine  21 mg Transdermal Daily  . rosuvastatin  40 mg Oral Daily  . venlafaxine XR  37.5 mg Oral Q breakfast   . sodium chloride    . sodium chloride     sodium chloride, sodium chloride, acetaminophen **OR** acetaminophen, alteplase, benzonatate, heparin, hydrALAZINE, lidocaine (PF), lidocaine-prilocaine, ondansetron **OR** ondansetron (ZOFRAN) IV, pentafluoroprop-tetrafluoroeth, zolpidem  Exam: No jvd Chest clear bilat RRR no mrg Abd soft ntnd Ext no LE edema TDC R chest, L RC AVF+bruit NF, ox 3  Dialysis: MWF [facility] 4h  350/700   61.5kg   2K/3Ca bath  Hep  None   R IJ TDC/ L RC AVF - Hectoral 2.44mcg IV q HD - Parsabiv 2.5mg  IV q HD     Impression: 1  Anemia/ recurrent GIB - hx bleeding AVM's.  For enteroscopy today.   2  ESRD MWF HD 3  HTN no vol excess 4  MBD cont meds 5  Hx CVA 6  DM2  Plan - HD tomorrow   Kelly Splinter MD [facility] pager [phone]   10/23/2016, 11:34 AM    Recent Labs Lab 10/21/16 2013 10/22/16 0208 10/23/16 0230  NA 138 136 138  K 4.4 4.5 4.4  CL 103 101 104  CO2 27 25 26   GLUCOSE 119* 168* 82  BUN 37* 40* 44*  CREATININE 8.27* 8.65* 9.45*  CALCIUM 7.5* 7.6* 7.4*  PHOS  --   --  4.4    Recent Labs Lab 10/16/16 1257  10/21/16 2013 10/23/16 0230  AST 16 11*  --   ALT 15 10*  --   ALKPHOS 98* 70  --   BILITOT 0.50 0.5  --   PROT 7.2 6.1*  --   ALBUMIN 2.9* 2.9* 2.6*    Recent Labs Lab 10/16/16 1257  10/21/16 2013 10/22/16 0208 10/22/16 0956 10/23/16 0230  WBC 4.9  < > 7.7 6.8 5.1 4.2  NEUTROABS 2.8  --  6.0  --   --   --   HGB 10.1*  < > 6.0* 7.5* 7.9* 7.2*  HCT 29.5*  < > 18.0* 22.9* 23.5* 21.3*  MCV 93  < > 90.9 89.1 88.7 88.0  PLT 256  < > 237 252 251 248  < > = values in this interval not displayed. Iron/TIBC/Ferritin/ %Sat    Component Value Date/Time   IRON 40 (L) 10/22/2016 0208   IRON 25 (L) 10/16/2016 1257   TIBC 188 (L) 10/22/2016 0208   TIBC 168 (L) 10/16/2016 1257   FERRITIN 275 10/22/2016 0208   FERRITIN 394 (H) 10/16/2016 1257  IRONPCTSAT 21 10/22/2016 0208   IRONPCTSAT 15 (L) 10/16/2016 1257   IRONPCTSAT 8 (L) 06/29/2014 9906

## 2016-10-23 NOTE — Progress Notes (Signed)
HD tx completed @ 0620 w/o problem, UF goal met, blood rinsed back, VSS, report called to Oneal Grout, RN

## 2016-10-23 NOTE — Anesthesia Preprocedure Evaluation (Signed)
Anesthesia Evaluation  Patient identified by MRN, date of birth, ID band Patient awake    Reviewed: Allergy & Precautions, NPO status , Patient's Chart, lab work & pertinent test results  Airway Mallampati: II  TM Distance: >3 FB Neck ROM: Full    Dental no notable dental hx. (+) Dental Advisory Given   Pulmonary sleep apnea , former smoker,    Pulmonary exam normal breath sounds clear to auscultation       Cardiovascular hypertension, + CAD  Normal cardiovascular exam Rhythm:Regular Rate:Normal     Neuro/Psych CVA negative psych ROS   GI/Hepatic negative GI ROS, Neg liver ROS,   Endo/Other  diabetes, Type 2  Renal/GU CRFRenal disease   BPH    Musculoskeletal negative musculoskeletal ROS (+)   Abdominal   Peds negative pediatric ROS (+)  Hematology negative hematology ROS (+) anemia ,   Anesthesia Other Findings   Reproductive/Obstetrics negative OB ROS                             Anesthesia Physical Anesthesia Plan  ASA: III  Anesthesia Plan: MAC   Post-op Pain Management:    Induction: Intravenous  PONV Risk Score and Plan: 2 and Treatment may vary due to age or medical condition  Airway Management Planned: Nasal Cannula  Additional Equipment:   Intra-op Plan:   Post-operative Plan: Extubation in OR  Informed Consent: I have reviewed the patients History and Physical, chart, labs and discussed the procedure including the risks, benefits and alternatives for the proposed anesthesia with the patient or authorized representative who has indicated his/her understanding and acceptance.   Dental advisory given  Plan Discussed with: CRNA  Anesthesia Plan Comments:         Anesthesia Quick Evaluation

## 2016-10-23 NOTE — Progress Notes (Signed)
HD tx initiated via HD cath w/o problem, Art port pull had moderate drang/push/flush well, VP pull/push/flush well, REversed lines, VSS, will cont to monitor while on HD tx

## 2016-10-23 NOTE — H&P (View-Only) (Signed)
[name]: 10:23 AM 10/22/2016  LOS: 1 day    Referring Provider: Dr [name]  Primary Care Physician:  [name], [name] in [facility] Primary Gastroenterologist: [name], [name].  Previously [name].      Reason for Consultation:  IDA, GIB.  Hx SB AVMs   HPI: [name] is a 71 y.o. male.  PMH ESRD on HD MWF for about 18 months.  Prostate cancer, Gleason grade 7, prostatectmy 01/2013.  Anemia of chronic disease and from Hill blood loss.  s/p multiple blood transfusions.  Previously treated with iron infusions (latest was 08/2015) supervised by [name] at [facility].  On ASA and Plavix for hx multiple CVAs, latest in 05/2015 "punctate ischemic stroke at right thalamus".   Chronic gait instability.  Elevated Troponin during 05/2016 admission due to demand ischemia.  Apparently was not taking his meds. Type 2 DM, currently on no meds for this.    Aranesp discontinued in past due to non-hemorrhagic stoke. CAD.  S/p inguinal hernia repair.     Several years of transfusion requiring anemia. FOBT + black stools intermittently.   2014 Capsule endo: SB AVMs.  2014 Enteroscopy: with cauterization of "a few SB AVMs"  02/2015 Colonoscopy: 2 adenomatous polyps, internal hemorrhoids.  03/2015 Capsule endoscopy: several small, non-bleeding AVMs scattered from proximal to distal SB.  Plan was continue iron infusion, prn transfusion, monitor Hgb.  04/10/2016 Enteroscopy 30 cm into Jejunum. APC cauterization of 33mm prox jejunal AVM lesion.  This oozed blood and needed additional APC following initial APC.  7 mm jejunal polyp biopsied: benign SB mucosa.  06/14/16 PRBC x 1 during CVA admission. Periodic blood transfusions and parenteral iron with HD.  Transfused PRBC x 4 in 06/2016 for Hgb 4.9.  Stool FOBT  +.  06/26/16 Enteroscopy, Troy [name].  APC'd 2 AVMs in prox jejunum.  One of these, larger, bled with minor scope trauma and was felt likely source of bleed.  08/21/2016 ROV with stroke MD says to continue Plavix for CVA prevention.    10/16/17 ROV with [name]. MD states "multifactorial anemia".  Ordered bone scan for low back pain and changes in spinal x ray though doubful of metastatic prostate CA.  Noted 20 # weight loss since 11/2015.    Stools always black, soft, formed.  No bleeding PR.  No abd pain.  No anorexia.  Pt says he has lost weight but weight of 138 to 141# in 06/2016 and 128 to 133# now.   No dysphagia.   Feeling weaker and lightheaded, no presyncope, over last few days.  Legs gave out and he fell to ground without LOC and came to ED.  Hypotensive.   Hgb 6.0.  Was 10.1 on 8/2.  S/p PRBC x 1.  hgb now 7.9 Coags, platelets ok.  NPO.  MD started PPI drip.         Past Medical History:  Diagnosis Date  . 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 Anemia, iron deficiency 03/24/2011   a. Recurrent Hill bleed,  tx with periodic iron infusions.  . 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 AVM (arteriovenous malformation) of duodenum, acquired    egds in 01/2012, 04/2011  . BPH (benign prostatic hyperplasia)   . CAD (coronary artery disease)    a. Cath 09/2012: moderate borderline CAD in mid LAD/small diagonal branch, mild RCA stenosis, to be managed medically   . Chronic lower back pain   . Diabetic peripheral neuropathy ([name]) 2014   foot pain.  . Essential hypertension 02/01/2012  . Fatty liver    on CT of 11/2010  . GERD (gastroesophageal reflux disease)   . Hill bleed    a. Recurrent Hill bleed, tx with IV iron. b. Per heme notes - likely AVMs 04/2012 (tx with cauterization several months ago).  . Hematuria    a. Urology note scan from 07/2012: cystoscopy without evidence for bladder lesion, only lateral hypertrophy of posterior urethra, bladder impression from BPH. b. Pt states he had  "some tests" scheduled for later in July 2014.  . High cholesterol   . Hyperlipidemia 02/01/2012  . Hypertension   . Hypertensive urgency 07/06/2016  . 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 Orthostatic hypotension    a. Tx with florinef.  . Peripheral neuropathy   . Polyp, colonic    Colonoscopy 01/2012 "benign" polyp  . Prostate cancer ([name])   . Sleep apnea    "had mask; couldn't sleep in it" (09/20/2012)  . Smoker   . SOB (shortness of breath) 07/06/2016  . Stage III chronic kidney disease    a. Stage 3 (DM with complications ->CKD, peripheral neuropathy).  . Stroke ([name])    x 2  . Symptomatic anemia 04/03/2015  . Tinea pedis 05/24/2012  . Tobacco use 02/01/2012  . Type 2 diabetes, uncontrolled, with neuropathy ([name]) 02/01/2012  . Type II diabetes mellitus ([place])    a. Dx 1994, uncontrolled.     Past Surgical History:  Procedure Laterality Date  . ENTEROSCOPY N/A 03/01/2013   Procedure: ENTEROSCOPY;  Surgeon: Inda Castle, MD;  Location: [name];  Service: Endoscopy;  Laterality: N/A;  . ENTEROSCOPY N/A 06/26/2016   Procedure: ENTEROSCOPY;  Surgeon: Milus Banister, MD;  Location: [facility];  Service: Endoscopy;  Laterality: N/A;  this is a push enteroscopy  . GIVENS CAPSULE STUDY N/A 04/05/2015   Procedure: GIVENS CAPSULE STUDY;  Surgeon: Gatha Mayer, MD;  Location: [name];  Service: Endoscopy;  Laterality: N/A;  . INGUINAL HERNIA REPAIR Right 2011  . LEFT HEART CATHETERIZATION WITH CORONARY ANGIOGRAM N/A 09/21/2012   Procedure: LEFT HEART CATHETERIZATION WITH CORONARY ANGIOGRAM;  Surgeon: Peter M Martinique, MD;  Location: [name];  Service: Cardiovascular;  Laterality: N/A;  . LUMBAR DISC SURGERY  1980's  . LYMPHADENECTOMY Bilateral 01/19/2013   Procedure: LYMPHADENECTOMY;  Surgeon: Bernestine Amass, MD;  Location: [name];  Service: Urology;  Laterality: Bilateral;  . ROBOT ASSISTED LAPAROSCOPIC RADICAL PROSTATECTOMY N/A 01/19/2013   Procedure:  ROBOTIC ASSISTED LAPAROSCOPIC RADICAL PROSTATECTOMY;  Surgeon: Bernestine Amass, MD;  Location: [name];  Service: Urology;  Laterality: N/A;  . SHOULDER OPEN ROTATOR CUFF REPAIR Left 1980's    Prior to Admission medications   Medication Sig Start Date End Date Taking? Authorizing Provider  Calcium Acetate 668 (169 Ca) MG TABS Take 1 tablet by mouth daily.   Yes [provider]  carvedilol (COREG) 3.125 MG tablet Take 1 tablet (3.125 mg total) by mouth 2 (two) times daily with a meal. 09/12/16  Yes  Saguier, Percell Miller, PA-C  cetirizine (ZYRTEC ALLERGY) 10 MG tablet Take 1 tablet (10 mg total) by mouth daily. 09/18/16  Yes Avie Echevaria B, PA-C  clopidogrel (PLAVIX) 75 MG tablet TAKE 1 TABLET BY MOUTH EVERY DAY 09/10/16  Yes Saguier, Percell Miller, PA-C  gabapentin (NEURONTIN) 300 MG capsule Take 1 capsule (300 mg total) by mouth at bedtime. Patient taking differently: Take 300 mg by mouth 3 (three) times daily.  07/07/16  Yes Domenic Polite, MD  multivitamin (RENA-VIT) TABS tablet Take 1 tablet by mouth daily.   Yes [provider]  omeprazole (PRILOSEC) 40 MG capsule Take 1 capsule (40 mg total) by mouth daily. 06/19/16  Yes Saguier, Percell Miller, PA-C  rosuvastatin (CRESTOR) 40 MG tablet Take 1 tablet (40 mg total) by mouth daily. 06/19/16  Yes Saguier, Percell Miller, PA-C  venlafaxine XR (EFFEXOR-XR) 37.5 MG 24 hr capsule Take 1 capsule (37.5 mg total) by mouth daily with breakfast. 10/07/16  Yes Saguier, Percell Miller, PA-C    Scheduled Meds: . amLODipine  5 mg Oral Daily  . calcium acetate  667 mg Oral QAC supper  . carvedilol  3.125 mg Oral BID WC  . fluticasone  2 spray Each Nare Daily  . gabapentin  300 mg Oral QHS  . loratadine  10 mg Oral Daily  . multivitamin  1 tablet Oral QHS  . nicotine  21 mg Transdermal Daily  . [START ON 10/25/2016] pantoprazole  40 mg Intravenous Q12H  . rosuvastatin  40 mg Oral Daily  . venlafaxine XR  37.5 mg Oral Q breakfast   Infusions: . pantoprozole (PROTONIX)  infusion 8 mg/hr (10/22/16 0207)   PRN Meds: acetaminophen **OR** acetaminophen, benzonatate, hydrALAZINE, ondansetron **OR** ondansetron (ZOFRAN) IV, zolpidem   Allergies as of 10/21/2016  . (No Known Allergies)    Family History  Problem Relation Age of Onset  . Stroke Father        Died at 24  . Diabetes Mother   . Heart attack Brother        Died at 35  . Diabetes Sister   . Stomach cancer Brother   . Heart attack Sister     Social History   Social History  . Marital status: Married    Spouse name: N/A  . Number of children: 2  . Years of education: N/A   Occupational History  . taken out of work due to back    Social History Main Topics  . Smoking status: Former Smoker    Packs/day: 1.00    Years: 30.00    Types: Cigarettes    Start date: 04/15/1968  . Smokeless tobacco: Never Used  . Alcohol use No     Comment: 09/20/2012 "Used to; stopped ~ 2009; never had problem w/it"  . Drug use: No  . Sexual activity: No   Other Topics Concern  . Not on file   Social History Narrative   Regular exercise: rides horses   Caffeine use: occasionally    REVIEW OF SYSTEMS: Constitutional:  Weakness in the last few days. ENT:  No nose bleeds Pulm:  No cough. No dyspnea. CV:  No palpitations, no LE edema.  No chest pain.  GU:  No hematuria, no frequency Hill:  Per HPI Heme:  Per HPI.  Patient denies unusual bleeding from any orifice or excessive bruising.   Transfusions:  See HPI Neuro:  No headaches, no peripheral tingling or numbness Derm:  No itching, no rash or sores.  No tattoos Endocrine:  No sweats or  chills.  No polyuria or dysuria Immunization:  Not queried.   Travel:  None beyond local counties in last few months.    PHYSICAL EXAM: Vital signs in last 24 hours: Vitals:   10/22/16 0018 10/22/16 0407  BP: (!) 153/78 (!) 151/88  Pulse: 78 81  Resp:  14  Temp: 97.8 F (36.6 C) 97.7 F (36.5 C)   Wt Readings from Last 3 Encounters:  10/22/16 60.5 kg  (133 lb 6.1 oz)  10/16/16 58.1 kg (128 lb)  10/07/16 62.1 kg (137 lb)    General: Troy, comfortable, not ill-appearing. He has a slight speech impediment. Head:  No asymmetry or facial swelling. No signs of head trauma.  Eyes:  No scleral icterus. Conjunctiva is pale. EOMI. Ears:  Not hard of hearing.  Nose:  No discharge or congestion Mouth:  Tongue midline. Oral mucosa pink, moist, clear. Very few remaining teeth. Neck:  No JVD, no thyromegaly, no masses. Lungs:  No trouble breathing or cough. Lungs clear to auscultation bilaterally. Good breath sounds. Heart: RRR. No MRG. S1, S2 present. Abdomen:  Soft. Nontender. No hernias, masses, organomegaly. Bowel sounds active..   Rectal: Deferred the rectal exam.  FOBT+ in ED yesterday.   Musc/Skeltl: No joint swelling, redness or gross deformity. Extremities:  No CCE.  Neurologic:  Alert. Oriented times 3. Moves all 4 limbs. Strength not tested. No obvious hemi-weakness or deficits. No tremor. Skin:  No rashes or sores. Tattoos:  None Nodes:  No cervical adenopathy.   Psych:  Troy, calm, cooperative.  Intake/Output from previous day: 08/07 0701 - 08/08 0700 In: 100 [IV Piggyback:100] Out: 0  Intake/Output this shift: Total I/O In: -  Out: 175 [Urine:175]  Hill RESULTS:  Recent Labs  10/21/16 2013 10/22/16 0208  WBC 7.7 6.8  HGB 6.0* 7.5*  HCT 18.0* 22.9*  PLT 237 252   BMET Hill Results  Component Value Date   NA 136 10/22/2016   NA 138 10/21/2016   NA 137 10/16/2016   K 4.5 10/22/2016   K 4.4 10/21/2016   K 4.3 10/16/2016   CL 101 10/22/2016   CL 103 10/21/2016   CL 97 (L) 10/16/2016   CO2 25 10/22/2016   CO2 27 10/21/2016   CO2 31 10/16/2016   GLUCOSE 168 (H) 10/22/2016   GLUCOSE 119 (H) 10/21/2016   GLUCOSE 149 (H) 10/16/2016   BUN 40 (H) 10/22/2016   BUN 37 (H) 10/21/2016   BUN 26 (H) 10/16/2016   CREATININE 8.65 (H) 10/22/2016   CREATININE 8.27 (H) 10/21/2016   CREATININE 8.4 (HH) 10/16/2016    CALCIUM 7.6 (L) 10/22/2016   CALCIUM 7.5 (L) 10/21/2016   CALCIUM 8.0 10/16/2016   LFT  Recent Labs  10/21/16 2013  PROT 6.1*  ALBUMIN 2.9*  AST 11*  ALT 10*  ALKPHOS 70  BILITOT 0.5   PT/INR Hill Results  Component Value Date   INR 1.07 10/21/2016   INR 1.08 06/23/2016   INR 1.06 06/13/2016    RADIOLOGY STUDIES: Dg Chest 2 View  Result Date: 10/21/2016 CLINICAL DATA:  Hypertension.  Weakness. EXAM: CHEST  2 VIEW COMPARISON:  August 19, 2016 FINDINGS: Central catheter tip is in the superior vena cava near the cavoatrial junction. No pneumothorax. There is no edema or consolidation. The heart size and pulmonary vascularity are normal. No adenopathy. No bone lesions. IMPRESSION: Central catheter as described without pneumothorax. No edema or consolidation. Electronically Signed   By: Lowella Grip III M.D.   On:  10/21/2016 20:57     IMPRESSION:   *  Recurrent GIB and associated acute on chronic anemia.  At enteroscopy's in January and April of this year he had successful cauterization of jejunal AVMs. Suspect recurrent bleeding from small bowel AVMs. S/p PRBC x 1 with good response.    *  ESRD.  HD today.    *  Hx CVAs.  On Plavix, last dose 8/7.     PLAN:     *  Hgb and Hct in AM.    *  Feed today, renal diet.  *  enterscopy tomorrow.    *  Only needs 1 x daily oral PPI.  So I discontinued the Protonix drip.     Azucena Freed  10/22/2016, 10:23 AM Pager: [phone]    [place] Hill Attending   I have taken an interval history, reviewed the chart and examined the patient. I agree with the Advanced Practitioner's note, impression and recommendations.    Gatha Mayer, MD, [facility] [phone] (pager) 10/22/2016 6:43 PM

## 2016-10-23 NOTE — Progress Notes (Addendum)
Holding am medications, patient is npo for enteroscopy at 1230 pm. Patient has been npo since midnight. Currently receiving 1 unit of PRBC's (hgb 7.2). No s/sx of complications. Will administer am medications after enteroscopy is complete and patient has diet ordered.

## 2016-10-23 NOTE — Anesthesia Postprocedure Evaluation (Signed)
Anesthesia Post Note  Patient: [name]  Procedure(s) Performed: Procedure(s) (LRB): ENTEROSCOPY (N/A)     Patient location during evaluation: PACU Anesthesia Type: MAC Level of consciousness: awake and alert Pain management: pain level controlled Vital Signs Assessment: post-procedure vital signs reviewed and stable Respiratory status: spontaneous breathing, nonlabored ventilation, respiratory function stable and patient connected to nasal cannula oxygen Cardiovascular status: stable and blood pressure returned to baseline Anesthetic complications: no    Last Vitals:  Vitals:   10/23/16 1410 10/23/16 1420  BP: (!) 120/46 119/72  Pulse: 78 85  Resp: 15 14  Temp:    SpO2: 98% 99%    Last Pain:  Vitals:   10/23/16 1404  TempSrc: Oral  PainSc:                  Audry Pili

## 2016-10-23 NOTE — Op Note (Signed)
[facility] Patient Name: [name] Procedure Date : 10/23/2016 MRN: [id number] Attending MD: Gatha Mayer , MD Date of Birth: 05-22-1945 CSN: [id number] Age: 71 Admit Type: Inpatient Procedure:                Small bowel enteroscopy Indications:              Melena, Arteriovenous malformation in the small                            intestine Providers:                Gatha Mayer, MD, Cleda Daub, RN, Corliss Parish, Technician Referring MD:              Medicines:                Propofol per Anesthesia, Monitored Anesthesia Care Complications:            No immediate complications. Estimated Blood Loss:     Estimated blood loss was minimal. Procedure:                Pre-Anesthesia Assessment:                           - Prior to the procedure, a History and Physical                            was performed, and patient medications and                            allergies were reviewed. The patient's tolerance of                            previous anesthesia was also reviewed. The risks                            and benefits of the procedure and the sedation                            options and risks were discussed with the patient.                            All questions were answered, and informed consent                            was obtained. Prior Anticoagulants: The patient                            last took aspirin on the day of the procedure and                            Plavix (clopidogrel) on the day of the procedure.  ASA Grade Assessment: III - A patient with severe                            systemic disease. After reviewing the risks and                            benefits, the patient was deemed in satisfactory                            condition to undergo the procedure.                           After obtaining informed consent, the endoscope was                            passed  under direct vision. Throughout the                            procedure, the patient's blood pressure, pulse, and                            oxygen saturations were monitored continuously. The                            ZOX-[id number] ([id number]) scope was introduced through                            the mouth and advanced to the proximal jejunum. The                            small bowel enteroscopy was accomplished without                            difficulty. The patient tolerated the procedure                            well. Scope In: Scope Out: Findings:      The esophagus was normal.      The stomach was normal.      There was no evidence of significant pathology in the entire examined       duodenum.      Two angiodysplastic lesions with no bleeding were found in the proximal       jejunum. Coagulation for bleeding prevention using argon plasma was       successful. Estimated blood loss was minimal. Impression:               - Normal esophagus.                           - Normal stomach.                           - Normal examined duodenum.                           - Two  non-bleeding angiodysplastic lesions in the                            jejunum. Treated with argon plasma coagulation                            (APC). Hopefully this will stop recent bleeding -                            he is predisposed to have more of these                            unfortunately                           - No specimens collected. Recommendation:            Procedure Code(s):        --- Professional ---                           [phone], Small intestinal endoscopy, enteroscopy                            beyond second portion of duodenum, not including                            ileum; with control of bleeding (eg, injection,                            bipolar cautery, unipolar cautery, laser, heater                            probe, stapler, plasma coagulator) Diagnosis Code(s):        ---  Professional ---                           K55.20, Angiodysplasia of colon without hemorrhage                           K92.1, Melena (includes Hematochezia)                           Q27.33, Arteriovenous malformation of digestive                            system vessel CPT copyright 2016 American Medical Association. All rights reserved. The codes documented in this report are preliminary and upon coder review may  be revised to meet current compliance requirements. Gatha Mayer, MD 10/23/2016 2:05:54 PM This report has been signed electronically. Number of Addenda: 0

## 2016-10-23 NOTE — Progress Notes (Signed)
OT Cancellation Note  Patient Details Name: [name] MRN: [id number] DOB: [birth year]   Cancelled Treatment:    Reason Eval/Treat Not Completed: Patient at procedure or test/ unavailable.  Will reattempt.  [place], OTR/L [phone]   Lucille Passy M 10/23/2016, 1:09 PM

## 2016-10-23 NOTE — Transfer of Care (Signed)
Immediate Anesthesia Transfer of Care Note  Patient: [name]  Procedure(s) Performed: Procedure(s): ENTEROSCOPY (N/A)  Patient Location: Endoscopy Unit  Anesthesia Type:MAC  Level of Consciousness: awake, alert  and oriented  Airway & Oxygen Therapy: Patient Spontanous Breathing and Patient connected to nasal cannula oxygen  Post-op Assessment: Report given to RN, Post -op Vital signs reviewed and stable and Patient moving all extremities X 4  Post vital signs: Reviewed and stable  Last Vitals:  Vitals:   10/23/16 0930 10/23/16 1301  BP:  (!) 198/93  Pulse: 77 78  Resp: 20 12  Temp: 36.8 C 36.8 C  SpO2: 100% 100%    Last Pain:  Vitals:   10/23/16 1301  TempSrc: Oral  PainSc: 6       Patients Stated Pain Goal: 3 (40/34/74 2595)  Complications: No apparent anesthesia complications

## 2016-10-23 NOTE — Progress Notes (Signed)
Hypoglycemic Event  CBG: 67  Treatment: D50 IV 25 mL   Symptoms: Hungry  Follow-up CBG: Time: 1335 CBG Result: 118  Possible Reasons for Event: Inadequate meal intake  Comments/MD notified: Dr. Carlean Purl notified of CBG of 67.     Sohil Timko A

## 2016-10-23 NOTE — Progress Notes (Signed)
PROGRESS NOTE    [name]  ZOX:[id number] DOB: 06/24/1945 DOA: 10/21/2016 PCP: Mackie Pai, PA-C  Brief Narrative:[name] is a 71 y.o. male with medical history significant of hypertension, hyperlipidemia, diet-controlled diabetes, stroke, GERD, prostate cancer, BPH, GI bleeding, AVM, CAD, anemia, chronic combined systolic and diastolic CHF, who presented with generalized weakness, melena and fall, Hb down to 6, melena noted in ER.   Assessment & Plan:    Acute on chronic blood loss anemia -h/o small bowel AVMs s/p cautery, last in 4/18 -s/p 1 units PRBC 8/7, Hb today 7.2, will transfuse another unit now -[facility] consulting, plan for enteroscopy today -plavix held    ESRD on HD -renal following, s/p HD early am    H/o CVA  -hold plavix, pending GI workup   HTN -continue coreg and amlodipine -BP stable and no active bleeding noted at this time   CAD -stable, plavix on hold -continue Coreg and statin  DVT prophylaxis: SCDs Code Status: Full Code Family Communication: None at bedside Disposition Plan: Home pending workup, Tx to floor today  Consultants:   [facility]  Renal     Subjective: No complaints, no melena or vomiting  Objective: Vitals:   10/23/16 0641 10/23/16 0831 10/23/16 0915 10/23/16 0930  BP: 140/70 (!) 160/77 (!) 160/77   Pulse: 77 76 77 77  Resp: 18 18 20 20   Temp: 98.7 F (37.1 C)  98.1 F (36.7 C) 98.3 F (36.8 C)  TempSrc: Oral  Oral Oral  SpO2: 100% 99% 100% 100%  Weight:      Height:        Intake/Output Summary (Last 24 hours) at 10/23/16 1053 Last data filed at 10/23/16 0800  Gross per 24 hour  Intake              730 ml  Output             1050 ml  Net             -320 ml   Filed Weights   10/22/16 0002 10/23/16 0237 10/23/16 0627  Weight: 60.5 kg (133 lb 6.1 oz) 62.5 kg (137 lb 12.6 oz) 62 kg (136 lb 11 oz)    Examination:  Gen: Awake, Alert, Oriented X 3, no distress HEENT: PERRLA, Neck supple, no JVD Lungs:  Good air movement bilaterally, CTAB CVS: RRR,No Gallops,Rubs or new Murmurs Abd: soft, Non tender, non distended, BS present Extremities: No Cyanosis, Clubbing or edema Skin: no new rashes    Data Reviewed:   CBC:  Recent Labs Lab 10/16/16 1257 10/21/16 2013 10/22/16 0208 10/22/16 0956 10/23/16 0230  WBC 4.9 7.7 6.8 5.1 4.2  NEUTROABS 2.8 6.0  --   --   --   HGB 10.1* 6.0* 7.5* 7.9* 7.2*  HCT 29.5* 18.0* 22.9* 23.5* 21.3*  MCV 93 90.9 89.1 88.7 88.0  PLT 256 237 252 251 811   Basic Metabolic Panel:  Recent Labs Lab 10/16/16 1257 10/21/16 2013 10/22/16 0208 10/23/16 0230  NA 137 138 136 138  K 4.3 4.4 4.5 4.4  CL 97* 103 101 104  CO2 31 27 25 26   GLUCOSE 149* 119* 168* 82  BUN 26* 37* 40* 44*  CREATININE 8.4* 8.27* 8.65* 9.45*  CALCIUM 8.0 7.5* 7.6* 7.4*  PHOS  --   --   --  4.4   GFR: Estimated Creatinine Clearance: 6.5 mL/min (A) (by C-G formula based on SCr of 9.45 mg/dL (H)). Liver Function Tests:  Recent Labs Lab 10/16/16 1257 10/21/16 2013 10/23/16 0230  AST 16 11*  --   ALT 15 10*  --   ALKPHOS 98* 70  --   BILITOT 0.50 0.5  --   PROT 7.2 6.1*  --   ALBUMIN 2.9* 2.9* 2.6*   No results for input(s): LIPASE, AMYLASE in the last 168 hours. No results for input(s): AMMONIA in the last 168 hours. Coagulation Profile:  Recent Labs Lab 10/21/16 2013  INR 1.07   Cardiac Enzymes: No results for input(s): CKTOTAL, CKMB, CKMBINDEX, TROPONINI in the last 168 hours. BNP (last 3 results)  Recent Labs  07/09/16 1438 07/31/16 1104 08/19/16 1155  PROBNP 1,044.0* 343.0* 349.0*   HbA1C: No results for input(s): HGBA1C in the last 72 hours. CBG:  Recent Labs Lab 10/21/16 1920 10/22/16 0544 10/23/16 0634  GLUCAP 121* 84 79   Lipid Profile: No results for input(s): CHOL, HDL, LDLCALC, TRIG, CHOLHDL, LDLDIRECT in the last 72 hours. Thyroid Function Tests: No results for input(s): TSH, T4TOTAL, FREET4, T3FREE, THYROIDAB in the last 72  hours. Anemia Panel:  Recent Labs  10/22/16 0208  VITAMINB12 821  FOLATE 49.8  FERRITIN 275  TIBC 188*  IRON 40*  RETICCTPCT 2.0   Urine analysis:    Component Value Date/Time   COLORURINE YELLOW 04/01/2016 [place] 04/01/2016 0939   LABSPEC >=1.030 (A) 04/01/2016 0939   PHURINE 5.5 04/01/2016 0939   GLUCOSEU NEGATIVE 04/01/2016 0939   HGBUR MODERATE (A) 04/01/2016 0939   BILIRUBINUR SMALL (A) 04/01/2016 0939   BILIRUBINUR small 06/21/2012 1041   KETONESUR TRACE (A) 04/01/2016 0939   PROTEINUR >300 (A) 12/25/2015 1028   UROBILINOGEN 0.2 04/01/2016 0939   NITRITE NEGATIVE 04/01/2016 0939   LEUKOCYTESUR NEGATIVE 04/01/2016 0939   Sepsis Labs: @LABRCNTIP (procalcitonin:4,lacticidven:4)  )No results found for this or any previous visit (from the past 240 hour(s)).       Radiology Studies: Dg Chest 2 View  Result Date: 10/21/2016 CLINICAL DATA:  Hypertension.  Weakness. EXAM: CHEST  2 VIEW COMPARISON:  August 19, 2016 FINDINGS: Central catheter tip is in the superior vena cava near the cavoatrial junction. No pneumothorax. There is no edema or consolidation. The heart size and pulmonary vascularity are normal. No adenopathy. No bone lesions. IMPRESSION: Central catheter as described without pneumothorax. No edema or consolidation. Electronically Signed   By: Lowella Grip III M.D.   On: 10/21/2016 20:57        Scheduled Meds: . amLODipine  5 mg Oral Daily  . calcium acetate  667 mg Oral QAC supper  . carvedilol  3.125 mg Oral BID WC  . doxercalciferol  2.5 mcg Intravenous Q M,W,F-HD  . fluticasone  2 spray Each Nare Daily  . gabapentin  300 mg Oral QHS  . loratadine  10 mg Oral Daily  . multivitamin  1 tablet Oral QHS  . nicotine  21 mg Transdermal Daily  . rosuvastatin  40 mg Oral Daily  . venlafaxine XR  37.5 mg Oral Q breakfast   Continuous Infusions: . sodium chloride    . sodium chloride       LOS: 2 days    Time spent:  25min    Domenic Polite, MD [facility] Pager [phone]  If 7PM-7AM, please contact night-coverage [email or web address] Password [email or web address] 10/23/2016, 10:53 AM

## 2016-10-24 DIAGNOSIS — D649 Anemia, unspecified: Secondary | ICD-10-CM

## 2016-10-24 DIAGNOSIS — D631 Anemia in chronic kidney disease: Secondary | ICD-10-CM

## 2016-10-24 DIAGNOSIS — K558 Other vascular disorders of intestine: Secondary | ICD-10-CM

## 2016-10-24 DIAGNOSIS — K922 Gastrointestinal hemorrhage, unspecified: Secondary | ICD-10-CM

## 2016-10-24 DIAGNOSIS — D62 Acute posthemorrhagic anemia: Secondary | ICD-10-CM

## 2016-10-24 DIAGNOSIS — N186 End stage renal disease: Secondary | ICD-10-CM

## 2016-10-24 LAB — TYPE AND SCREEN
ABO/RH(D): AB POS
ANTIBODY SCREEN: NEGATIVE
UNIT DIVISION: 0
Unit division: 0

## 2016-10-24 LAB — BPAM RBC
Blood Product Expiration Date: 201808232359
Blood Product Expiration Date: 201808242359
ISSUE DATE / TIME: 201808072212
ISSUE DATE / TIME: 201808090906
Unit Type and Rh: 8400
Unit Type and Rh: 8400

## 2016-10-24 LAB — CBC
HCT: 25.7 % — ABNORMAL LOW (ref 39.0–52.0)
Hemoglobin: 8.7 g/dL — ABNORMAL LOW (ref 13.0–17.0)
MCH: 29.3 pg (ref 26.0–34.0)
MCHC: 33.9 g/dL (ref 30.0–36.0)
MCV: 86.5 fL (ref 78.0–100.0)
PLATELETS: 249 10*3/uL (ref 150–400)
RBC: 2.97 MIL/uL — ABNORMAL LOW (ref 4.22–5.81)
RDW: 15.6 % — AB (ref 11.5–15.5)
WBC: 7.3 10*3/uL (ref 4.0–10.5)

## 2016-10-24 LAB — GLUCOSE, CAPILLARY: GLUCOSE-CAPILLARY: 83 mg/dL (ref 65–99)

## 2016-10-24 MED ORDER — NICOTINE 21 MG/24HR TD PT24: 21.0000 mg | MEDICATED_PATCH | Freq: Every day | TRANSDERMAL | 0 refills | Status: DC

## 2016-10-24 MED ORDER — AMLODIPINE BESYLATE 5 MG PO TABS: 5.0000 mg | ORAL_TABLET | Freq: Every day | ORAL | 0 refills | Status: DC

## 2016-10-24 MED ORDER — CLOPIDOGREL BISULFATE 75 MG PO TABS: 75.0000 mg | ORAL_TABLET | Freq: Every day | ORAL | 0 refills | Status: DC

## 2016-10-24 MED ORDER — DOXERCALCIFEROL 4 MCG/2ML IV SOLN
INTRAVENOUS | Status: AC
  Filled 2016-10-24: qty 2

## 2016-10-24 NOTE — Discharge Instructions (Signed)
Gastrointestinal Bleeding °Gastrointestinal bleeding is bleeding somewhere along the path food travels through the body (digestive tract). This path is anywhere between the mouth and the opening of the butt (anus). You may have blood in your poop (stools) or have black poop. If you throw up (vomit), there may be blood in it. °This condition can be mild, serious, or even life-threatening. If you have a lot of bleeding, you may need to stay in the hospital. °Follow these instructions at home: °· Take over-the-counter and prescription medicines only as told by your doctor. °· Eat foods that have a lot of fiber in them. These foods include whole grains, fruits, and vegetables. You can also try eating 1-3 prunes each day. °· Drink enough fluid to keep your pee (urine) clear or pale yellow. °· Keep all follow-up visits as told by your doctor. This is important. °Contact a doctor if: °· Your symptoms do not get better. °Get help right away if: °· Your bleeding gets worse. °· You feel dizzy or you pass out (faint). °· You feel weak. °· You have very bad cramps in your back or belly (abdomen). °· You pass large clumps of blood (clots) in your poop. °· Your symptoms are getting worse. °This information is not intended to replace advice given to you by your health care provider. Make sure you discuss any questions you have with your health care provider. °Document Released: 12/11/2007 Document Revised: 08/09/2015 Document Reviewed: 08/21/2014 °Elsevier Interactive Patient Education © 2018 Elsevier Inc. ° °

## 2016-10-24 NOTE — Care Management Important Message (Signed)
Important Message  Patient Details  Name: [name] MRN: [id number] Date of Birth: [birth year]   Medicare Important Message Given:  Yes    Nathen May 10/24/2016, 9:33 AM

## 2016-10-24 NOTE — Discharge Summary (Signed)
Physician Discharge Summary  CHEO SELVEY ZOX:[id number] DOB: 1945-05-25 DOA: 10/21/2016  PCP: Mackie Pai, PA-C  Admit date: 10/21/2016 Discharge date: 10/24/2016  Admitted From: home Disposition:  home  Recommendations for Outpatient Follow-up:  1. Follow up with PCP in 1-2 weeks. Please check hemoglobin during outpatient visit. 2. plavix will be held for one week and can be resumed on 8/17 If H&H stable as outpatient and no further GI bleed..   Home Health:none Equipment/Devices:*none  Discharge Condition:fair CODE STATUS: full code Diet recommendation: Heart Healthy / renal     Discharge Diagnoses:  Principal Problem:   AVM (arteriovenous malformation) of small bowel, acquired with hemorrhage ([place])   Active Problems:   Symptomatic anemia   Anemia of renal disease   Essential hypertension   Hyperlipidemia   CAD (coronary artery disease)   Cerebral infarction ([name])   GERD (gastroesophageal reflux disease)   ESRD (end stage renal disease) ([name])   GIB (gastrointestinal bleeding)   Fall   Brief narrative/history of present illness 71 y.o.malewith medical history significant of hypertension, hyperlipidemia, diet-controlled diabetes, stroke, GERD, prostate cancer, BPH, GI bleeding, AVM, CAD, anemia, chronic combined systolic and diastolic CHF, who presented with generalized weakness, melenaand fall, Hb down to 6. Patient was found to have melanotic stool in the ED. Admitted to telemetry and GI consulted.  Hospital course Acute on chronic blood loss anemia -h/o recurrent GI bleed associated with bleeding small bowel AVMs. Had enteroscopy is in January and April this year with cauterization of jejunal AVMs. -Received 2 unit PRBC and hemoglobin improved to 8.7 today. -[facility] consulted. 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 No further bleeding and H&H stable. Normal esophagus,  stomach and duodenum on exam. Hold plavix until 8/17 per GI. Patient is at high risk for recurrent bleeding given his recurrent bleeding from AVMs and vasculopathic status.    ESRD on HD -renal following, received scheduled hemodialysis while in the hospital.    H/o CVA  -Plavix held. GI recommends to resume Plavix from 8/17 if no further GI bleed.  Essential hypertension -continue coreg and amlodipine -BP stable  Coronary artery disease -stable, plavix held until 8/17. -continue Coreg and statin  Patient stable to be discharged home with outpatient follow-up.   Family Communication: None at bedside Disposition Plan: Home   Procedures: EGD  Consultants:   [facility]  Renal     Discharge Instructions   Allergies as of 10/24/2016   No Known Allergies     Medication List    TAKE these medications   amLODipine 5 MG tablet Commonly known as:  NORVASC Take 1 tablet (5 mg total) by mouth daily. What changed:  when to take this   Calcium Acetate 668 (169 Ca) MG Tabs Take 1 tablet by mouth daily.   carvedilol 3.125 MG tablet Commonly known as:  COREG Take 1 tablet (3.125 mg total) by mouth 2 (two) times daily with a meal.   cetirizine 10 MG tablet Commonly known as:  ZYRTEC ALLERGY Take 1 tablet (10 mg total) by mouth daily.   clopidogrel 75 MG tablet Commonly known as:  PLAVIX Take 1 tablet (75 mg total) by mouth daily. Start taking on:  10/31/2016 What changed:  See the new instructions.  These instructions start on 10/31/2016. If you are unsure what to do until then, ask your doctor or other care provider.   gabapentin 300 MG capsule Commonly known as:  NEURONTIN Take 1 capsule (300 mg total) by mouth at bedtime. What changed:  when to take this   multivitamin Tabs tablet Take 1 tablet by mouth daily.   nicotine 21 mg/24hr patch Commonly known as:  NICODERM CQ - dosed in mg/24 hours Place 1 patch (21 mg total) onto the skin daily.    omeprazole 40 MG capsule Commonly known as:  PRILOSEC Take 1 capsule (40 mg total) by mouth daily.   rosuvastatin 40 MG tablet Commonly known as:  CRESTOR Take 1 tablet (40 mg total) by mouth daily.   venlafaxine XR 37.5 MG 24 hr capsule Commonly known as:  EFFEXOR-XR Take 1 capsule (37.5 mg total) by mouth daily with breakfast.      Follow-up Information    Saguier, Percell Miller, PA-C Follow up in 1 week(s).   Specialties:  Internal Medicine, Family Medicine Contact information: [address] [place] [address] [phone]          No Known Allergies       Procedures/Studies: Dg Chest 2 View  Result Date: 10/21/2016 CLINICAL DATA:  Hypertension.  Weakness. EXAM: CHEST  2 VIEW COMPARISON:  August 19, 2016 FINDINGS: Central catheter tip is in the superior vena cava near the cavoatrial junction. No pneumothorax. There is no edema or consolidation. The heart size and pulmonary vascularity are normal. No adenopathy. No bone lesions. IMPRESSION: Central catheter as described without pneumothorax. No edema or consolidation. Electronically Signed   By: Lowella Grip III M.D.   On: 10/21/2016 20:57   Dg Lumbar Spine 2-3 Views  Result Date: 10/07/2016 CLINICAL DATA:  One month history of low back pain and coccygeal pain. No known injuries. Personal history of prostate cancer. EXAM: LUMBAR SPINE - 2-3 VIEW COMPARISON:  Bone window images from CT abdomen pelvis 12/25/2015. Lumbar spine x-rays 10/12/2013. FINDINGS: Five non-rib-bearing lumbar vertebrae with anatomic alignment. No acute fractures. Severe disc space narrowing and endplate hypertrophic changes at L4-5 and L5-S1. Moderate disc space narrowing at L3-4. Lucencies in the vertebral bodies at L2, L3, L4 and L5 (and possibly T11, T12 and L1). No evidence of sclerotic osseous metastatic disease. Sacroiliac joints intact with degenerative changes. Mild aortoiliac atherosclerosis without aneurysm. IMPRESSION: 1. No  acute osseous abnormality. 2. Lucencies in the vertebral bodies from L2 through L5 (and possibly T11, T12 and L1 as well). This may be the due to osseous demineralization with aggressive osteoporosis, though metastatic disease and myeloma can have a similar appearance. 3. Severe degenerative disc disease and spondylosis at L4-5 and L5-S1. Moderate degenerative disc disease at L3-4. 4. No evidence of sclerotic osseous metastatic disease. 5.  Aortic Atherosclerosis (ICD10-170.0) Electronically Signed   By: Evangeline Dakin M.D.   On: 10/07/2016 09:20   Dg Sacrum/coccyx  Result Date: 10/07/2016 CLINICAL DATA:  Tailbone pains for 1 month.  No injury is reported. EXAM: SACRUM AND COCCYX - 2+ VIEW COMPARISON:  Lumbar spine is reported separately. FINDINGS: There is no evidence of fracture or other focal bone lesions. There is no sacral fracture or coccygeal dislocation. IMPRESSION: Negative. Electronically Signed   By: Staci Righter M.D.   On: 10/07/2016 09:16     Subjective: Seen during dialysis. Denies chest pain, shortness of breath or dizziness. No further GI bleed.  Discharge Exam: Vitals:   10/24/16 1130 10/24/16 1140  BP: 121/73 137/71  Pulse: 86 80  Resp: 18 14  Temp:  98 F (36.7 C)  SpO2:     Vitals:  10/24/16 1030 10/24/16 1100 10/24/16 1130 10/24/16 1140  BP: (!) 145/78 112/62 121/73 137/71  Pulse: 86 82 86 80  Resp: 13 12 18 14   Temp:    98 F (36.7 C)  TempSrc:    Oral  SpO2:      Weight:      Height:        General: Elderly male not in distress HEENT: Pallor present, moist, supple neck Chest: Clear to condition bilaterally  CVS: Normal S1 and S2, no murmurs GI: Soft, nondistended, nontender, bowel sounds present Musculoskeletal: Warm, no edema     The results of significant diagnostics from this hospitalization (including imaging, microbiology, ancillary and laboratory) are listed below for reference.     Microbiology: No results found for this or any  previous visit (from the past 240 hour(s)).   Labs: BNP (last 3 results)  Recent Labs  07/25/16 0430  BNP 1,610.9*   Basic Metabolic Panel:  Recent Labs Lab 10/21/16 2013 10/22/16 0208 10/23/16 0230  NA 138 136 138  K 4.4 4.5 4.4  CL 103 101 104  CO2 27 25 26   GLUCOSE 119* 168* 82  BUN 37* 40* 44*  CREATININE 8.27* 8.65* 9.45*  CALCIUM 7.5* 7.6* 7.4*  PHOS  --   --  4.4   Liver Function Tests:  Recent Labs Lab 10/21/16 2013 10/23/16 0230  AST 11*  --   ALT 10*  --   ALKPHOS 70  --   BILITOT 0.5  --   PROT 6.1*  --   ALBUMIN 2.9* 2.6*   No results for input(s): LIPASE, AMYLASE in the last 168 hours. No results for input(s): AMMONIA in the last 168 hours. CBC:  Recent Labs Lab 10/21/16 2013 10/22/16 0208 10/22/16 0956 10/23/16 0230 10/24/16 0222  WBC 7.7 6.8 5.1 4.2 7.3  NEUTROABS 6.0  --   --   --   --   HGB 6.0* 7.5* 7.9* 7.2* 8.7*  HCT 18.0* 22.9* 23.5* 21.3* 25.7*  MCV 90.9 89.1 88.7 88.0 86.5  PLT 237 252 251 248 249   Cardiac Enzymes: No results for input(s): CKTOTAL, CKMB, CKMBINDEX, TROPONINI in the last 168 hours. BNP: Invalid input(s): POCBNP CBG:  Recent Labs Lab 10/23/16 0634 10/23/16 1317 10/23/16 1404 10/23/16 1605 10/24/16 0732  GLUCAP 79 67 118* 119* 83   D-Dimer No results for input(s): DDIMER in the last 72 hours. Hgb A1c No results for input(s): HGBA1C in the last 72 hours. Lipid Profile No results for input(s): CHOL, HDL, LDLCALC, TRIG, CHOLHDL, LDLDIRECT in the last 72 hours. Thyroid function studies No results for input(s): TSH, T4TOTAL, T3FREE, THYROIDAB in the last 72 hours.  Invalid input(s): FREET3 Anemia work up  Recent Labs  10/22/16 0208  VITAMINB12 821  FOLATE 49.8  FERRITIN 275  TIBC 188*  IRON 40*  RETICCTPCT 2.0   Urinalysis    Component Value Date/Time   COLORURINE YELLOW 04/01/2016 [place] 04/01/2016 0939   LABSPEC >=1.030 (A) 04/01/2016 0939   PHURINE 5.5 04/01/2016  0939   GLUCOSEU NEGATIVE 04/01/2016 0939   HGBUR MODERATE (A) 04/01/2016 0939   BILIRUBINUR SMALL (A) 04/01/2016 0939   BILIRUBINUR small 06/21/2012 1041   KETONESUR TRACE (A) 04/01/2016 0939   PROTEINUR >300 (A) 12/25/2015 1028   UROBILINOGEN 0.2 04/01/2016 0939   NITRITE NEGATIVE 04/01/2016 0939   LEUKOCYTESUR NEGATIVE 04/01/2016 0939   Sepsis Labs Invalid input(s): PROCALCITONIN,  WBC,  LACTICIDVEN Microbiology No results found for this or  any previous visit (from the past 240 hour(s)).   Time coordinating discharge: Over 30 minutes  SIGNED:   Louellen Molder, MD  [facility] 10/24/2016, 1:35 PM Pager   If 7PM-7AM, please contact night-coverage [email or web address] Password [email or web address]

## 2016-10-24 NOTE — Progress Notes (Signed)
          Daily Rounding Note  10/24/2016, 10:31 AM  LOS: 3 days   SUBJECTIVE:   Pt not seen, in HD.    OBJECTIVE:         Vital signs in last 24 hours:    Temp:  [98 F (36.7 C)-98.6 F (37 C)] 98.4 F (36.9 C) (08/10 0755) Pulse Rate:  [73-102] 81 (08/10 0900) Resp:  [10-18] 11 (08/10 0900) BP: (88-198)/(33-93) 145/88 (08/10 0900) SpO2:  [94 %-100 %] 98 % (08/10 0755) Weight:  [56 kg (123 lb 7.3 oz)-61.7 kg (136 lb)] 56 kg (123 lb 7.3 oz) (08/10 0700) Last BM Date: 10/20/16 Filed Weights   10/23/16 0627 10/23/16 1301 10/24/16 0700  Weight: 62 kg (136 lb 11 oz) 61.7 kg (136 lb) 56 kg (123 lb 7.3 oz)     Lab Results:  Recent Labs  10/22/16 0956 10/23/16 0230 10/24/16 0222  WBC 5.1 4.2 7.3  HGB 7.9* 7.2* 8.7*  HCT 23.5* 21.3* 25.7*  PLT 251 248 249   BMET  LFT  ASSESMENT:   *  Recurrent GIB and associated acute on chronic anemia.  At enteroscopies in January and April of this year he had successful cauterization of jejunal AVMs. Suspect recurrent bleeding from small bowel AVMs. EGD 8/9: APC ablation of 2, non-bleeding jejunal AVMs.   S/p PRBC x 2 with good response.    *  ESRD.  HD MWF.    *  Hx CVAs.  On Plavix, last dose 8/7.      PLAN   *  No plans for further GI workup.  Would be ideal if he could come off Plavix permanently but..  For now plan to restart this on 8/17.  Serial Hgbs can be obtained at his dialysis center, [facility] in [facility].    Azucena Freed  10/24/2016, 10:31 AM Pager: [phone]    [facility] Attending   I have taken an interval history, reviewed the chart and examined the patient. I agree with the Advanced Practitioner's note, impression and recommendations.    Gatha Mayer, MD, [facility] [phone] (pager) 10/24/2016 12:02 PM

## 2016-10-24 NOTE — Progress Notes (Signed)
OT Cancellation Note  Patient Details Name: [name] MRN: [id number] DOB: Jan 10, 1946   Cancelled Treatment:    Reason Eval/Treat Not Completed: Patient at procedure or test/ unavailable.  Will reattempt.  Rube Sanchez [place], OTR/L [phone]   Lucille Passy M 10/24/2016, 10:56 AM

## 2016-10-24 NOTE — Care Management Note (Signed)
Case Management Note  Patient Details  Name: [name] MRN: [id number] Date of Birth: [birth year]  Subjective/Objective:                 Spoke w patient at bedside. He would like to use [facility] for HHPT as rec by PT eval. Requested HH orders from MD. Referral placed to [facility]. Patient states he has cane at home, no other DME needs.    Action/Plan:  DC to home w [place] though [facility] Expected Discharge Date:  10/24/16               Expected Discharge Plan:  [place]  In-House Referral:     Discharge planning Services  CM Consult  Post Acute Care Choice:  Home Health Choice offered to:     DME Arranged:    DME Agency:     HH Arranged:  PT [place]:  [place]  Status of Service:  Completed, signed off  If discussed at [place] of Stay Meetings, dates discussed:    Additional Comments:  Carles Collet, RN 10/24/2016, 2:01 PM

## 2016-10-24 NOTE — Procedures (Signed)
   I was present at this dialysis session, have reviewed the session itself and made  appropriate changes Kelly Splinter MD [place] pager [phone]   10/24/2016, 11:51 AM

## 2016-10-25 ENCOUNTER — Other Ambulatory Visit: Payer: Self-pay | Admitting: Medical

## 2016-10-26 ENCOUNTER — Encounter (HOSPITAL_COMMUNITY): Payer: Self-pay | Admitting: Internal Medicine

## 2016-10-27 ENCOUNTER — Telehealth: Payer: Self-pay | Admitting: Behavioral Health

## 2016-10-27 NOTE — Telephone Encounter (Signed)
Patient voiced that he will call the office back later to complete TCM/Hospital Follow-up questions. 

## 2016-10-27 NOTE — Telephone Encounter (Signed)
Transition Care Management Follow-up Telephone Call  PCP: Elise Benne  Admit date: 10/21/2016 Discharge date: 10/24/2016  Admitted From: home Disposition:  home  Recommendations for Outpatient Follow-up:  1. Follow up with PCP in 1-2 weeks. Please check hemoglobin during outpatient visit. 2. plavix will be held for one week and can be resumed on 8/17 If H&H stable as outpatient and no further GI bleed..   Home Health:none Equipment/Devices:*none  Discharge Condition:fair   How have you been since you were released from the hospital? Patient stated, "I'm doing alright".   Do you understand why you were in the hospital? yes   Do you understand the discharge instructions? yes   Where were you discharged to? Home   Items Reviewed:  Medications reviewed: yes  Allergies reviewed: yes, NKA  Dietary changes reviewed: yes, heart healthy diet  Referrals reviewed: yes, Follow up with PCP in 1-2 weeks. Please check hemoglobin during outpatient visit.   Functional Questionnaire:   Activities of Daily Living (ADLs):   He states they are independent in the following: ambulation, bathing and hygiene, feeding, continence, grooming, toileting and dressing States they require assistance with the following: None   Any transportation issues/concerns?: no   Any patient concerns? no   Confirmed importance and date/time of follow-up visits scheduled: 10/28/16 at 10:30 AM.  Provider Appointment booked with Mackie Pai, PA-C.  Confirmed with patient if condition begins to worsen call PCP or go to the ER.  Patient was given the office number and encouraged to call back with question or concerns.  : yes

## 2016-10-28 ENCOUNTER — Encounter: Payer: Self-pay | Admitting: *Deleted

## 2016-10-28 ENCOUNTER — Encounter: Payer: Self-pay | Admitting: Medical

## 2016-10-28 ENCOUNTER — Telehealth: Payer: Self-pay | Admitting: Medical

## 2016-10-28 ENCOUNTER — Ambulatory Visit (INDEPENDENT_AMBULATORY_CARE_PROVIDER_SITE_OTHER): Payer: Medicare Other | Admitting: Medical

## 2016-10-28 ENCOUNTER — Telehealth: Payer: Self-pay | Admitting: Emergency Medicine

## 2016-10-28 VITALS — BP 150/80 | HR 99 | Temp 97.8°F | Resp 16 | Ht 67.0 in | Wt 132.0 lb

## 2016-10-28 DIAGNOSIS — K921 Melena: Secondary | ICD-10-CM | POA: Diagnosis not present

## 2016-10-28 DIAGNOSIS — K5520 Angiodysplasia of colon without hemorrhage: Secondary | ICD-10-CM

## 2016-10-28 DIAGNOSIS — F32A Depression, unspecified: Secondary | ICD-10-CM

## 2016-10-28 DIAGNOSIS — N186 End stage renal disease: Secondary | ICD-10-CM

## 2016-10-28 DIAGNOSIS — I639 Cerebral infarction, unspecified: Secondary | ICD-10-CM | POA: Diagnosis not present

## 2016-10-28 DIAGNOSIS — D649 Anemia, unspecified: Secondary | ICD-10-CM | POA: Diagnosis not present

## 2016-10-28 DIAGNOSIS — F329 Major depressive disorder, single episode, unspecified: Secondary | ICD-10-CM | POA: Diagnosis not present

## 2016-10-28 DIAGNOSIS — I10 Essential (primary) hypertension: Secondary | ICD-10-CM

## 2016-10-28 LAB — CBC WITH DIFFERENTIAL/PLATELET
BASOS ABS: 0.1 10*3/uL (ref 0.0–0.1)
Basophils Relative: 2.2 % (ref 0.0–3.0)
EOS ABS: 0.2 10*3/uL (ref 0.0–0.7)
Eosinophils Relative: 4.5 % (ref 0.0–5.0)
HCT: 30.2 % — ABNORMAL LOW (ref 39.0–52.0)
Hemoglobin: 9.9 g/dL — ABNORMAL LOW (ref 13.0–17.0)
LYMPHS ABS: 0.8 10*3/uL (ref 0.7–4.0)
Lymphocytes Relative: 19.5 % (ref 12.0–46.0)
MCHC: 32.9 g/dL (ref 30.0–36.0)
MCV: 93.2 fl (ref 78.0–100.0)
MONO ABS: 0.7 10*3/uL (ref 0.1–1.0)
MONOS PCT: 16.9 % — AB (ref 3.0–12.0)
NEUTROS PCT: 56.9 % (ref 43.0–77.0)
Neutro Abs: 2.2 10*3/uL (ref 1.4–7.7)
Platelets: 311 10*3/uL (ref 150.0–400.0)
RBC: 3.24 Mil/uL — AB (ref 4.22–5.81)
RDW: 16.8 % — ABNORMAL HIGH (ref 11.5–15.5)
WBC: 3.9 10*3/uL — ABNORMAL LOW (ref 4.0–10.5)

## 2016-10-28 LAB — COMPREHENSIVE METABOLIC PANEL
ALBUMIN: 3.9 g/dL (ref 3.5–5.2)
ALK PHOS: 94 U/L (ref 39–117)
ALT: 10 U/L (ref 0–53)
AST: 11 U/L (ref 0–37)
BILIRUBIN TOTAL: 0.4 mg/dL (ref 0.2–1.2)
BUN: 22 mg/dL (ref 6–23)
CO2: 31 mEq/L (ref 19–32)
Calcium: 8.3 mg/dL — ABNORMAL LOW (ref 8.4–10.5)
Chloride: 98 mEq/L (ref 96–112)
Creatinine, Ser: 7.11 mg/dL (ref 0.40–1.50)
GFR: 9.88 mL/min — AB (ref >60.00)
GLUCOSE: 138 mg/dL — AB (ref 70–99)
Potassium: 4.5 mEq/L (ref 3.5–5.1)
Sodium: 138 mEq/L (ref 135–145)
TOTAL PROTEIN: 7.1 g/dL (ref 6.0–8.3)

## 2016-10-28 NOTE — Telephone Encounter (Signed)
"  CRITICAL VALUE STICKER  CRITICAL VALUE:Creat. 7.11 & GFR 9.87  RECEIVER (on-site recipient of call):Murlin Schrieber P.  DATE & TIME NOTIFIED: 1340 10-27-16 MESSENGER (representative from lab):Dede Query  MD NOTIFIED: Etter Sjogren  TIME OF NOTIFICATION:1345  RESPONSE:

## 2016-10-28 NOTE — Patient Instructions (Addendum)
For recent anemia and gi bleed will get cbc stat and cmp.  Your stool test is + for blood today so will notify your GI MD who saw you in hosptial today. This may prevent you from getting back on plavix. We need to make sure hb/hct stable.  For htn continue current mes.  For esrd continue dialysis.  For mild constipation hydrate well and can use dulcolax otc if needed.  Note mood is now improving after tx for anemia. Wil stay on current dose of effexor  Follow up date to be determined after lab review.  May need to get appointment in near future with Dr. Carlean Purl.

## 2016-10-28 NOTE — Progress Notes (Signed)
Subjective:    Patient ID: [name], male    DOB: February 13, 1946, 71 y.o.   MRN: [id number]  HPI  Pt in states he felt fatigue/exhausted before his most recent hospitalization. His hb/hct was 6/18.0 day of admission. On DC 8.7/25.7   Pt was admitted with anemia. Pt had 2 prior episodes of small bowel bleeding AVM in January and [place]. He had cauteritions of jejunal AVMs in past.  -[facility] consulted this admission. 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 No further bleeding and H&H stable. Normal esophagus, stomach and duodenum on exam.  Dc summary instructions stated: Hold plavix until 8/17 per GI. Patient is at high risk for recurrent bleeding given his recurrent bleeding from AVMs and vasculopathic status.  Pt also transfused this hospitalization.   Pt was told to stop plavix until follow up and repeat labs. Will repeat cbc and cmp today.  Since left hospital he states feels very good/ a lot better. Much better energy. NO black or bloody stools.  Pt is dialysis pt/ESRD.  Hx of htn on coreg and amlodipine.  Before he was admitted he was depressed siting no energy. Then he state mood is a lot better now that he was treated and has energy.  Pt states daily bm but small and harder than usual. No abdomen pain.   Review of Systems  Constitutional: Negative for chills, fatigue and fever.  HENT: Negative for congestion.   Respiratory: Negative for cough, chest tightness, shortness of breath and wheezing.   Cardiovascular: Negative for chest pain and palpitations.  Gastrointestinal: Positive for constipation. Negative for abdominal distention, abdominal pain, blood in stool, diarrhea, nausea, rectal pain and vomiting.       Every day has bm. Though little smaller and harder.  Musculoskeletal: Negative for back pain, gait problem, myalgias and neck stiffness.  Skin: Negative for rash.    Psychiatric/Behavioral: Negative for behavioral problems and confusion.    Past Medical History:  Diagnosis Date  . 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 Anemia, iron deficiency 03/24/2011   a. Recurrent GI bleed, tx with periodic iron infusions.  . 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 AVM (arteriovenous malformation) of duodenum, acquired    egds in 01/2012, 04/2011  . BPH (benign prostatic hyperplasia)   . CAD (coronary artery disease)    a. Cath 09/2012: moderate borderline CAD in mid LAD/small diagonal branch, mild RCA stenosis, to be managed medically   . Chronic lower back pain   . Diabetic peripheral neuropathy ([place]) 2014   foot pain.  . Essential hypertension 02/01/2012  . Fatty liver    on CT of 11/2010  . GERD (gastroesophageal reflux disease)   . GI bleed    a. Recurrent GI bleed, tx with IV iron. b. Per heme notes - likely AVMs 04/2012 (tx with cauterization several months ago).  . Hematuria    a. Urology note scan from 07/2012: cystoscopy without evidence for bladder lesion, only lateral hypertrophy of posterior urethra, bladder impression from BPH. b. Pt states he had "some tests" scheduled for later in July 2014.  . High cholesterol   . Hyperlipidemia 02/01/2012  . Hypertension   . Hypertensive urgency 07/06/2016  . 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 Orthostatic hypotension    a. Tx with florinef.  . Peripheral neuropathy   . Polyp, colonic    Colonoscopy 01/2012 "benign" polyp  . Prostate cancer ([place])   .  Sleep apnea    "had mask; couldn't sleep in it" (09/20/2012)  . Smoker   . SOB (shortness of breath) 07/06/2016  . Stage III chronic kidney disease    a. Stage 3 (DM with complications ->CKD, peripheral neuropathy).  . Stroke ([place])    x 2  . Symptomatic anemia 04/03/2015  . Tinea pedis 05/24/2012  . Tobacco use 02/01/2012  . Type 2 diabetes, uncontrolled, with neuropathy ([place]) 02/01/2012  . Type II diabetes mellitus ([place])    a. Dx  1994, uncontrolled.      Social History   Social History  . Marital status: Married    Spouse name: N/A  . Number of children: 2  . Years of education: N/A   Occupational History  . taken out of work due to back    Social History Main Topics  . Smoking status: Former Smoker    Packs/day: 1.00    Years: 30.00    Types: Cigarettes    Start date: 04/15/1968  . Smokeless tobacco: Never Used  . Alcohol use No     Comment: 09/20/2012 "Used to; stopped ~ 2009; never had problem w/it"  . Drug use: No  . Sexual activity: No   Other Topics Concern  . Not on file   Social History Narrative   Regular exercise: rides horses   Caffeine use: occasionally    Past Surgical History:  Procedure Laterality Date  . ENTEROSCOPY N/A 03/01/2013   Procedure: ENTEROSCOPY;  Surgeon: Inda Castle, MD;  Location: [facility];  Service: Endoscopy;  Laterality: N/A;  . ENTEROSCOPY N/A 06/26/2016   Procedure: ENTEROSCOPY;  Surgeon: Milus Banister, MD;  Location: [facility];  Service: Endoscopy;  Laterality: N/A;  this is a push enteroscopy  . ENTEROSCOPY N/A 10/23/2016   Procedure: ENTEROSCOPY;  Surgeon: Gatha Mayer, MD;  Location: [facility] ENDOSCOPY;  Service: Endoscopy;  Laterality: N/A;  . GIVENS CAPSULE STUDY N/A 04/05/2015   Procedure: GIVENS CAPSULE STUDY;  Surgeon: Gatha Mayer, MD;  Location: [facility];  Service: Endoscopy;  Laterality: N/A;  . INGUINAL HERNIA REPAIR Right 2011  . LEFT HEART CATHETERIZATION WITH CORONARY ANGIOGRAM N/A 09/21/2012   Procedure: LEFT HEART CATHETERIZATION WITH CORONARY ANGIOGRAM;  Surgeon: Peter M Martinique, MD;  Location: [facility] CATH LAB;  Service: Cardiovascular;  Laterality: N/A;  . LUMBAR DISC SURGERY  1980's  . LYMPHADENECTOMY Bilateral 01/19/2013   Procedure: LYMPHADENECTOMY;  Surgeon: Bernestine Amass, MD;  Location: [facility];  Service: Urology;  Laterality: Bilateral;  . ROBOT ASSISTED LAPAROSCOPIC RADICAL PROSTATECTOMY N/A 01/19/2013   Procedure: ROBOTIC ASSISTED  LAPAROSCOPIC RADICAL PROSTATECTOMY;  Surgeon: Bernestine Amass, MD;  Location: [facility];  Service: Urology;  Laterality: N/A;  . SHOULDER OPEN ROTATOR CUFF REPAIR Left 1980's    Family History  Problem Relation Age of Onset  . Stroke Father        Died at 49  . Diabetes Mother   . Heart attack Brother        Died at 78  . Diabetes Sister   . Stomach cancer Brother   . Heart attack Sister     No Known Allergies  Current Outpatient Prescriptions on File Prior to Visit  Medication Sig Dispense Refill  . amLODipine (NORVASC) 5 MG tablet Take 1 tablet (5 mg total) by mouth daily. 30 tablet 0  . Calcium Acetate 668 (169 Ca) MG TABS Take 1 tablet by mouth daily.    . carvedilol (COREG) 3.125 MG tablet Take 1 tablet (  3.125 mg total) by mouth 2 (two) times daily with a meal. 60 tablet 0  . cetirizine (ZYRTEC ALLERGY) 10 MG tablet Take 1 tablet (10 mg total) by mouth daily. 30 tablet 0  . [START ON 10/31/2016] clopidogrel (PLAVIX) 75 MG tablet Take 1 tablet (75 mg total) by mouth daily. 30 tablet 0  . gabapentin (NEURONTIN) 300 MG capsule Take 1 capsule (300 mg total) by mouth at bedtime. (Patient taking differently: Take 300 mg by mouth 3 (three) times daily. ) 30 capsule 0  . multivitamin (RENA-VIT) TABS tablet Take 1 tablet by mouth daily.    . nicotine (NICODERM CQ - DOSED IN MG/24 HOURS) 21 mg/24hr patch Place 1 patch (21 mg total) onto the skin daily. 28 patch 0  . omeprazole (PRILOSEC) 40 MG capsule Take 1 capsule (40 mg total) by mouth daily. 30 capsule 3  . venlafaxine XR (EFFEXOR-XR) 37.5 MG 24 hr capsule Take 1 capsule (37.5 mg total) by mouth daily with breakfast. 30 capsule 0  . rosuvastatin (CRESTOR) 40 MG tablet TAKE 1 TABLET BY MOUTH EVERY DAY 30 tablet 5   No current facility-administered medications on file prior to visit.     BP (!) 156/88   Pulse 99   Temp 97.8 F (36.6 C) (Oral)   Resp 16   Ht 5\' 7"  (1.702 m)   Wt 132 lb (59.9 kg)   SpO2 100%   BMI 20.67 kg/m        Objective:   Physical Exam  General- No acute distress. Pleasant patient. Neck- Full range of motion, no jvd Lungs- Clear, even and unlabored. Heart- regular rate and rhythm. Neurologic- CNII- XII grossly intact.  Abdomen- soft, non-tender, nondistened, +bs. Back- no cva tenderness.  Rectal exam- no masses. Normal sphincter tone. hemocult card positive for blood.         Assessment & Plan:  For recent anemia and gi bleed will get cbc stat and cmp.  Your stool test is + for blood today so will notify your GI MD who saw you in hosptial today. This may prevent you from getting back on plavix. We need to make sure hb/hct stable.  For htn continue current mes.  For esrd continue dialysis.  For mild constipation hydrate well and can use dulcolax otc if needed. Note mood is now improving after tx for anemia. Wil stay on current dose of effexor   Follow up date to be determined after lab review.    May need to get appointment in near future with Dr. Carlean Purl.

## 2016-10-28 NOTE — Telephone Encounter (Signed)
Called patient and he sees someone over at [facility].  Labs faxed over.  Advised patient that Percell Miller may call with further instructions on lab.

## 2016-10-28 NOTE — Telephone Encounter (Signed)
Pt is a dialysis pt--- fax labs to nephrologist

## 2016-10-28 NOTE — Telephone Encounter (Signed)
Will you see the GI referral and see if can be seen within one week.

## 2016-10-29 ENCOUNTER — Telehealth: Payer: Self-pay | Admitting: Gastroenterology

## 2016-10-29 NOTE — Telephone Encounter (Signed)
Message has been sent to referring provider notifying them of this.

## 2016-10-29 NOTE — Telephone Encounter (Signed)
[facility]  [address]  [place]  [place], [place] [address]  [phone]  Kathie Dike, MD  [place], [place] [address]  [phone]  [phone] (Fax)    Freda Jackson the pt has been seeing [facility] last phone note 04/14/16 states pt was given recommendations they should follow with that office.  Thanks

## 2016-10-29 NOTE — Telephone Encounter (Signed)
Referral sent to [facility] requesting urgent appointment

## 2016-11-04 ENCOUNTER — Encounter (HOSPITAL_COMMUNITY)
Admission: RE | Admit: 2016-11-04 | Discharge: 2016-11-04 | Disposition: A | Discharge status: Home / Self Care | Payer: Medicare Other | Source: Ambulatory Visit | Attending: Hematology & Oncology | Admitting: Hematology & Oncology

## 2016-11-04 DIAGNOSIS — C61 Malignant neoplasm of prostate: Secondary | ICD-10-CM

## 2016-11-04 MED ORDER — TECHNETIUM TC 99M MEDRONATE IV KIT
20.0000 | PACK | Freq: Once | INTRAVENOUS | Status: AC | PRN
  Administered 2016-11-04: 20 via INTRAVENOUS

## 2016-11-06 ENCOUNTER — Other Ambulatory Visit (HOSPITAL_BASED_OUTPATIENT_CLINIC_OR_DEPARTMENT_OTHER): Payer: Medicare Other

## 2016-11-06 ENCOUNTER — Ambulatory Visit (HOSPITAL_BASED_OUTPATIENT_CLINIC_OR_DEPARTMENT_OTHER): Payer: Medicare Other | Admitting: Family

## 2016-11-06 VITALS — BP 179/86 | HR 85 | Temp 97.8°F | Resp 16 | Wt 137.0 lb

## 2016-11-06 DIAGNOSIS — K922 Gastrointestinal hemorrhage, unspecified: Secondary | ICD-10-CM

## 2016-11-06 DIAGNOSIS — C61 Malignant neoplasm of prostate: Secondary | ICD-10-CM

## 2016-11-06 DIAGNOSIS — D500 Iron deficiency anemia secondary to blood loss (chronic): Secondary | ICD-10-CM | POA: Diagnosis present

## 2016-11-06 DIAGNOSIS — D631 Anemia in chronic kidney disease: Secondary | ICD-10-CM | POA: Diagnosis not present

## 2016-11-06 DIAGNOSIS — Z992 Dependence on renal dialysis: Secondary | ICD-10-CM

## 2016-11-06 DIAGNOSIS — N184 Chronic kidney disease, stage 4 (severe): Secondary | ICD-10-CM

## 2016-11-06 DIAGNOSIS — E1165 Type 2 diabetes mellitus with hyperglycemia: Secondary | ICD-10-CM | POA: Diagnosis not present

## 2016-11-06 DIAGNOSIS — Q2733 Arteriovenous malformation of digestive system vessel: Secondary | ICD-10-CM

## 2016-11-06 DIAGNOSIS — N189 Chronic kidney disease, unspecified: Principal | ICD-10-CM

## 2016-11-06 DIAGNOSIS — N186 End stage renal disease: Secondary | ICD-10-CM

## 2016-11-06 LAB — COMPREHENSIVE METABOLIC PANEL (CC13)
A/G RATIO: 1.1 — AB (ref 1.2–2.2)
ALBUMIN: 4 g/dL (ref 3.6–4.8)
ALT: 6 IU/L (ref 0–44)
AST (SGOT): 8 IU/L (ref 0–40)
Alkaline Phosphatase, S: 113 IU/L (ref 39–117)
BUN / CREAT RATIO: 3 — AB (ref 10–24)
BUN: 18 mg/dL (ref 8–27)
Bilirubin Total: 0.3 mg/dL (ref 0.0–1.2)
CALCIUM: 9 mg/dL (ref 8.6–10.2)
Carbon Dioxide, Total: 30 mmol/L — ABNORMAL HIGH (ref 20–29)
Chloride, Ser: 100 mmol/L (ref 96–106)
Creatinine, Ser: 6.56 mg/dL — ABNORMAL HIGH (ref 0.76–1.27)
GFR, EST AFRICAN AMERICAN: 9 mL/min/{1.73_m2} — AB (ref >59)
GFR, EST NON AFRICAN AMERICAN: 8 mL/min/{1.73_m2} — AB (ref >59)
GLOBULIN, TOTAL: 3.7 g/dL (ref 1.5–4.5)
Glucose: 109 mg/dL — ABNORMAL HIGH (ref 65–99)
Potassium, Ser: 4.8 mmol/L (ref 3.5–5.2)
Sodium: 138 mmol/L (ref 134–144)
TOTAL PROTEIN: 7.7 g/dL (ref 6.0–8.5)

## 2016-11-06 LAB — CBC WITH DIFFERENTIAL (CANCER CENTER ONLY)
BASO#: 0 10*3/uL (ref 0.0–0.2)
BASO%: 0.6 % (ref 0.0–2.0)
EOS ABS: 0.2 10*3/uL (ref 0.0–0.5)
EOS%: 4 % (ref 0.0–7.0)
HEMATOCRIT: 32.6 % — AB (ref 38.7–49.9)
HEMOGLOBIN: 10.8 g/dL — AB (ref 13.0–17.1)
LYMPH#: 1 10*3/uL (ref 0.9–3.3)
LYMPH%: 19.1 % (ref 14.0–48.0)
MCH: 31.4 pg (ref 28.0–33.4)
MCHC: 33.1 g/dL (ref 32.0–35.9)
MCV: 95 fL (ref 82–98)
MONO#: 0.7 10*3/uL (ref 0.1–0.9)
MONO%: 13.2 % — ABNORMAL HIGH (ref 0.0–13.0)
NEUT%: 63.1 % (ref 40.0–80.0)
NEUTROS ABS: 3.3 10*3/uL (ref 1.5–6.5)
Platelets: 258 10*3/uL (ref 145–400)
RBC: 3.44 10*6/uL — AB (ref 4.20–5.70)
RDW: 17.1 % — ABNORMAL HIGH (ref 11.1–15.7)
WBC: 5.2 10*3/uL (ref 4.0–10.0)

## 2016-11-06 NOTE — Progress Notes (Signed)
Hematology and Oncology Follow Up Visit  [name] [id number] Jun 24, 1945 71 y.o. 11/06/2016   Principle Diagnosis:  1. 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 IV iron as indicated 2. Supportive blood transfusion as needed    Interim History:  [name] is here today with his wife for follow-up. He is doing fairly well. He is receiving hemodialysis 3 days a week and tolerating this nicely. He currently has a right chest vas cath for treatment and an AV graft in the left forearm that is maturing.  He was hospitalized a week ago with anemia (Hgb 6.0) and found to have a hemorrhaging AVM in the small bowel and APC was performed. He receiving 2 units of blood and held his Plavix for one week.  He is now home and feeling much better. We will see what his iron studie show and if he could use any IV iron.  He is still having some lower back discomfort. Thankfully his bone scan showed no evidence of malignancy.  No fever, chills, n/v, cough, rash, dizziness, SOB, chest pain, palpitations, abdominal pain or changes in bowel or bladder habits. He still makes a small amount of urine each day.  He has occasional constipation and eats fruit and uses a stool softener as needed.  The neuropathy in his feet is unchanged. No swelling or tenderness in his extremities. No c/o pain at this time.  He has maintained a good appetite and is staying well hydrated. His weight is stable.  He states that his blood sugars have been much better controlled.   ECOG Performance Status: 1 - Symptomatic but completely ambulatory  Medications:  Allergies as of 11/06/2016   No Known Allergies     Medication List       Accurate as of 11/06/16   1:12 PM. Always use your most recent med list.          amLODipine 5 MG tablet Commonly known as:  NORVASC Take 1 tablet (5 mg total) by mouth daily.   Calcium Acetate 668 (169 Ca) MG Tabs Take 1 tablet by mouth daily.   carvedilol 3.125 MG tablet Commonly known as:  COREG Take 1 tablet (3.125 mg total) by mouth 2 (two) times daily with a meal.   cetirizine 10 MG tablet Commonly known as:  ZYRTEC ALLERGY Take 1 tablet (10 mg total) by mouth daily.   clopidogrel 75 MG tablet Commonly known as:  PLAVIX Take 1 tablet (75 mg total) by mouth daily.   gabapentin 300 MG capsule Commonly known as:  NEURONTIN Take 1 capsule (300 mg total) by mouth at bedtime.   multivitamin Tabs tablet Take 1 tablet by mouth daily.   nicotine 21 mg/24hr patch Commonly known as:  NICODERM CQ - dosed in mg/24 hours Place 1 patch (21 mg total) onto the skin daily.   omeprazole 40 MG capsule Commonly known as:  PRILOSEC Take 1 capsule (40 mg total) by mouth daily.   rosuvastatin 40 MG tablet Commonly known as:  CRESTOR TAKE 1 TABLET BY MOUTH EVERY DAY   venlafaxine XR 37.5 MG 24 hr capsule Commonly known as:  EFFEXOR-XR Take 1 capsule (37.5 mg total) by mouth daily with breakfast.       Allergies: No  Known Allergies  Past Medical History, Surgical history, Social history, and Family History were reviewed and updated.  Review of Systems: All other 10 point review of systems is negative.   Physical Exam:  vitals were not taken for this visit.  Wt Readings from Last 3 Encounters:  10/28/16 132 lb (59.9 kg)  10/24/16 132 lb 0.9 oz (59.9 kg)  10/16/16 128 lb (58.1 kg)    Ocular: Sclerae unicteric, pupils equal, round and reactive to light Ear-nose-throat: Oropharynx clear, dentition fair Lymphatic: No cervical, supraclavicular or axillary adenopathy Lungs no rales or rhonchi, good excursion bilaterally Heart regular rate and rhythm, no murmur appreciated Abd soft, nontender,  positive bowel sounds, no liver or spleen tip palpated on exam, no fluid wave  MSK no focal spinal tenderness, no joint edema Neuro: non-focal, well-oriented, appropriate affect Breasts: Deferred   Lab Results  Component Value Date   WBC 3.9 (L) 10/28/2016   HGB 9.9 (L) 10/28/2016   HCT 30.2 (L) 10/28/2016   MCV 93.2 10/28/2016   PLT 311.0 10/28/2016   Lab Results  Component Value Date   FERRITIN 275 10/22/2016   IRON 40 (L) 10/22/2016   TIBC 188 (L) 10/22/2016   UIBC 148 10/22/2016   IRONPCTSAT 21 10/22/2016   Lab Results  Component Value Date   RETICCTPCT 2.0 10/22/2016   RBC 3.24 (L) 10/28/2016   RETICCTABS 76.5 03/15/2015   No results found for: KPAFRELGTCHN, LAMBDASER, KAPLAMBRATIO No results found for: IGGSERUM, IGA, IGMSERUM No results found for: Odetta Pink, SPEI   Chemistry      Component Value Date/Time   NA 138 10/28/2016 1153   NA 137 10/16/2016 1257   NA 141 11/15/2015 0917   K 4.5 10/28/2016 1153   K 4.3 10/16/2016 1257   K 4.0 11/15/2015 0917   CL 98 10/28/2016 1153   CL 97 (L) 10/16/2016 1257   CO2 31 10/28/2016 1153   CO2 31 10/16/2016 1257   CO2 27 11/15/2015 0917   BUN 22 10/28/2016 1153   BUN 26 (H) 10/16/2016 1257   BUN 18.7 11/15/2015 0917   CREATININE 7.11 (HH) 10/28/2016 1153   CREATININE 8.4 (HH) 10/16/2016 1257   CREATININE 5.3 (HH) 11/15/2015 0917      Component Value Date/Time   CALCIUM 8.3 (L) 10/28/2016 1153   CALCIUM 8.0 10/16/2016 1257   CALCIUM 8.9 11/15/2015 0917   ALKPHOS 94 10/28/2016 1153   ALKPHOS 98 (H) 10/16/2016 1257   ALKPHOS 117 11/15/2015 0917   AST 11 10/28/2016 1153   AST 16 10/16/2016 1257   AST 12 11/15/2015 0917   ALT 10 10/28/2016 1153   ALT 15 10/16/2016 1257   ALT <9 11/15/2015 0917   BILITOT 0.4 10/28/2016 1153   BILITOT 0.50 10/16/2016 1257   BILITOT <0.30 11/15/2015 0917      Impression and Plan: [name] is a very pleasant 71 yo African  American gentleman with multifactorial anemia - iron deficiency (bleeding) AVM's and renal insufficiency anemia (uncontrolled diabetes). He was recently hospitalized for bleeding AVM and received 2 units of blood. He is back home and feeling much better.  We will see what his iron studies show and bring him back in next week for an infusion if needed.  His bone scan showed no evidence of malignancy.  We will go ahead and plan to see him back again in another 4 weeks for repeat lab work and follow-up.  He will contact our office with  any questions or concerns We can certainly see him sooner if need be.   Eliezer Bottom, NP 8/23/20181:12 PM

## 2016-11-07 LAB — IRON AND TIBC
%SAT: 26 % (ref 20–55)
IRON: 49 ug/dL (ref 42–163)
TIBC: 186 ug/dL — ABNORMAL LOW (ref 202–409)
UIBC: 137 ug/dL (ref 117–376)

## 2016-11-07 LAB — FERRITIN: FERRITIN: 732 ng/mL — AB (ref 22–316)

## 2016-11-11 ENCOUNTER — Other Ambulatory Visit: Payer: Self-pay | Admitting: Medical

## 2016-11-11 ENCOUNTER — Encounter: Payer: Self-pay | Admitting: Medical

## 2016-11-11 ENCOUNTER — Telehealth: Payer: Self-pay | Admitting: Medical

## 2016-11-11 MED ORDER — RANITIDINE HCL 150 MG PO CAPS: 150.0000 mg | ORAL_CAPSULE | Freq: Two times a day (BID) | ORAL | 0 refills | Status: DC

## 2016-11-11 MED ORDER — VENLAFAXINE HCL ER 37.5 MG PO CP24: 37.5000 mg | ORAL_CAPSULE | Freq: Every day | ORAL | 1 refills | Status: DC

## 2016-11-11 NOTE — Telephone Encounter (Signed)
[name]-- please see below potential medication interactions and advise if ok to still refill omeprazole?  Drug-Drug: omeprazole and clopidogrel  Use of omeprazole or esomeprazole may lead to reduced ability of Clopidogrel to inhibit platelet aggregation and increase the risk of subsequent cardiovascular events. Coadministration of Proton Pump Inhibitors and Clopidogrel should be avoided according to the official package labeling of Clopidogrel. However, according to an expert consensus document, the benefits of omeprazole or esomeprazole may outweigh the risk of the potential reduction in cardiovascular efficacy in patients receiving Clopidogrel who are at high risk for gastrointestinal bleeding.

## 2016-11-11 NOTE — Telephone Encounter (Signed)
Ranitidine prescription sent to pharmacy. In place of omeprazole due to potential interaction/decreasing efficacy of Plavix.

## 2016-11-11 NOTE — Telephone Encounter (Signed)
Patient has been taking proton pump inhibitors on review of chart since 2014. However as you stated he is on Plavix.   Please advise patient that I want to try ranitidine for his reflux.  If you would explain to him the concern about omeprazole decreasing the effectiveness of Plavix. Which is given to help reduce chance of.  Let me know what he says please. But would like to try ranitidine in place of omeprazole.  Was both his gabapentin and Effexor called in?

## 2016-11-18 ENCOUNTER — Telehealth: Payer: Self-pay | Admitting: Medical

## 2016-11-18 NOTE — Telephone Encounter (Signed)
Caller name: Threasa Beards  Relation to pt: physical therapist Call back number: [phone] Pharmacy:  Reason for call: Physical Therapy needing verbal approval for pt to have therapy  twices a wk for two wks. Please advise.

## 2016-11-19 NOTE — Telephone Encounter (Signed)
See Jetta Lout note. Can give verbal ok to get PT as they request. Will you see call back number and give them verbal authorization.

## 2016-11-20 NOTE — Telephone Encounter (Signed)
Verbal left on voice mail.  [name] was encouraged to call back with questions or concerns.

## 2016-12-03 ENCOUNTER — Telehealth: Payer: Self-pay | Admitting: Medical

## 2016-12-03 NOTE — Telephone Encounter (Signed)
I am okay with more occupational therapy or physical therapy if that is what they're requesting. Can give a verbal order for 1 times a week for 2 weeks. If they fax over the written order then will get that sign.

## 2016-12-03 NOTE — Telephone Encounter (Signed)
[facility] - [phone]   Requesting to continue OT with pt   Frequency - 1 time a week for 2 weeks

## 2016-12-04 ENCOUNTER — Ambulatory Visit: Payer: Medicare Other | Admitting: Hematology & Oncology

## 2016-12-04 ENCOUNTER — Other Ambulatory Visit: Payer: Medicare Other

## 2016-12-05 NOTE — Telephone Encounter (Signed)
Spoke with Ritu she will fax over orders of PCP sign.

## 2016-12-05 NOTE — Telephone Encounter (Signed)
Left a vm for [facility] to call back

## 2016-12-08 NOTE — Progress Notes (Signed)
This encounter was created in error - please disregard.

## 2016-12-15 ENCOUNTER — Other Ambulatory Visit: Payer: Self-pay | Admitting: Medical

## 2016-12-15 ENCOUNTER — Encounter: Payer: Self-pay | Admitting: Medical

## 2016-12-22 ENCOUNTER — Emergency Department (HOSPITAL_BASED_OUTPATIENT_CLINIC_OR_DEPARTMENT_OTHER): Payer: Medicare Other

## 2016-12-22 ENCOUNTER — Encounter (HOSPITAL_BASED_OUTPATIENT_CLINIC_OR_DEPARTMENT_OTHER): Payer: Self-pay | Admitting: Emergency Medicine

## 2016-12-22 ENCOUNTER — Inpatient Hospital Stay (HOSPITAL_COMMUNITY): Payer: Medicare Other

## 2016-12-22 ENCOUNTER — Inpatient Hospital Stay (HOSPITAL_BASED_OUTPATIENT_CLINIC_OR_DEPARTMENT_OTHER)
Admission: EM | Admit: 2016-12-22 | Discharge: 2016-12-25 | DRG: 286 | Disposition: A | Discharge status: Home / Self Care | Payer: Medicare Other | Attending: Internal Medicine | Admitting: Internal Medicine

## 2016-12-22 DIAGNOSIS — I169 Hypertensive crisis, unspecified: Secondary | ICD-10-CM

## 2016-12-22 DIAGNOSIS — Z8673 Personal history of transient ischemic attack (TIA), and cerebral infarction without residual deficits: Secondary | ICD-10-CM

## 2016-12-22 DIAGNOSIS — Z79899 Other long term (current) drug therapy: Secondary | ICD-10-CM

## 2016-12-22 DIAGNOSIS — I10 Essential (primary) hypertension: Secondary | ICD-10-CM | POA: Diagnosis not present

## 2016-12-22 DIAGNOSIS — E1122 Type 2 diabetes mellitus with diabetic chronic kidney disease: Secondary | ICD-10-CM | POA: Diagnosis present

## 2016-12-22 DIAGNOSIS — K5520 Angiodysplasia of colon without hemorrhage: Secondary | ICD-10-CM | POA: Diagnosis present

## 2016-12-22 DIAGNOSIS — J810 Acute pulmonary edema: Secondary | ICD-10-CM | POA: Diagnosis not present

## 2016-12-22 DIAGNOSIS — I639 Cerebral infarction, unspecified: Secondary | ICD-10-CM | POA: Diagnosis present

## 2016-12-22 DIAGNOSIS — Z23 Encounter for immunization: Secondary | ICD-10-CM | POA: Diagnosis present

## 2016-12-22 DIAGNOSIS — Z992 Dependence on renal dialysis: Secondary | ICD-10-CM

## 2016-12-22 DIAGNOSIS — I509 Heart failure, unspecified: Secondary | ICD-10-CM

## 2016-12-22 DIAGNOSIS — E118 Type 2 diabetes mellitus with unspecified complications: Secondary | ICD-10-CM | POA: Diagnosis present

## 2016-12-22 DIAGNOSIS — D509 Iron deficiency anemia, unspecified: Secondary | ICD-10-CM | POA: Diagnosis present

## 2016-12-22 DIAGNOSIS — I5021 Acute systolic (congestive) heart failure: Secondary | ICD-10-CM | POA: Diagnosis not present

## 2016-12-22 DIAGNOSIS — M545 Low back pain: Secondary | ICD-10-CM | POA: Diagnosis present

## 2016-12-22 DIAGNOSIS — G8929 Other chronic pain: Secondary | ICD-10-CM | POA: Diagnosis present

## 2016-12-22 DIAGNOSIS — K219 Gastro-esophageal reflux disease without esophagitis: Secondary | ICD-10-CM | POA: Diagnosis present

## 2016-12-22 DIAGNOSIS — Z87891 Personal history of nicotine dependence: Secondary | ICD-10-CM

## 2016-12-22 DIAGNOSIS — Z800 Family history of malignant neoplasm of digestive organs: Secondary | ICD-10-CM

## 2016-12-22 DIAGNOSIS — K760 Fatty (change of) liver, not elsewhere classified: Secondary | ICD-10-CM | POA: Diagnosis present

## 2016-12-22 DIAGNOSIS — Z823 Family history of stroke: Secondary | ICD-10-CM

## 2016-12-22 DIAGNOSIS — Z8249 Family history of ischemic heart disease and other diseases of the circulatory system: Secondary | ICD-10-CM

## 2016-12-22 DIAGNOSIS — E785 Hyperlipidemia, unspecified: Secondary | ICD-10-CM | POA: Diagnosis present

## 2016-12-22 DIAGNOSIS — I2510 Atherosclerotic heart disease of native coronary artery without angina pectoris: Secondary | ICD-10-CM | POA: Diagnosis present

## 2016-12-22 DIAGNOSIS — J9601 Acute respiratory failure with hypoxia: Secondary | ICD-10-CM | POA: Diagnosis present

## 2016-12-22 DIAGNOSIS — G4730 Sleep apnea, unspecified: Secondary | ICD-10-CM | POA: Diagnosis present

## 2016-12-22 DIAGNOSIS — R778 Other specified abnormalities of plasma proteins: Secondary | ICD-10-CM

## 2016-12-22 DIAGNOSIS — D631 Anemia in chronic kidney disease: Secondary | ICD-10-CM | POA: Diagnosis present

## 2016-12-22 DIAGNOSIS — I5043 Acute on chronic combined systolic (congestive) and diastolic (congestive) heart failure: Secondary | ICD-10-CM | POA: Diagnosis present

## 2016-12-22 DIAGNOSIS — K5900 Constipation, unspecified: Secondary | ICD-10-CM | POA: Diagnosis present

## 2016-12-22 DIAGNOSIS — Z7902 Long term (current) use of antithrombotics/antiplatelets: Secondary | ICD-10-CM

## 2016-12-22 DIAGNOSIS — I25119 Atherosclerotic heart disease of native coronary artery with unspecified angina pectoris: Secondary | ICD-10-CM | POA: Diagnosis not present

## 2016-12-22 DIAGNOSIS — I5023 Acute on chronic systolic (congestive) heart failure: Secondary | ICD-10-CM | POA: Diagnosis not present

## 2016-12-22 DIAGNOSIS — N189 Chronic kidney disease, unspecified: Secondary | ICD-10-CM

## 2016-12-22 DIAGNOSIS — I132 Hypertensive heart and chronic kidney disease with heart failure and with stage 5 chronic kidney disease, or end stage renal disease: Principal | ICD-10-CM | POA: Diagnosis present

## 2016-12-22 DIAGNOSIS — R0602 Shortness of breath: Secondary | ICD-10-CM | POA: Diagnosis present

## 2016-12-22 DIAGNOSIS — R748 Abnormal levels of other serum enzymes: Secondary | ICD-10-CM | POA: Diagnosis not present

## 2016-12-22 DIAGNOSIS — I255 Ischemic cardiomyopathy: Secondary | ICD-10-CM | POA: Diagnosis present

## 2016-12-22 DIAGNOSIS — E1142 Type 2 diabetes mellitus with diabetic polyneuropathy: Secondary | ICD-10-CM | POA: Diagnosis present

## 2016-12-22 DIAGNOSIS — N186 End stage renal disease: Secondary | ICD-10-CM | POA: Diagnosis present

## 2016-12-22 DIAGNOSIS — E1121 Type 2 diabetes mellitus with diabetic nephropathy: Secondary | ICD-10-CM | POA: Diagnosis present

## 2016-12-22 DIAGNOSIS — I160 Hypertensive urgency: Secondary | ICD-10-CM | POA: Diagnosis present

## 2016-12-22 DIAGNOSIS — Z833 Family history of diabetes mellitus: Secondary | ICD-10-CM

## 2016-12-22 DIAGNOSIS — Z8546 Personal history of malignant neoplasm of prostate: Secondary | ICD-10-CM | POA: Diagnosis not present

## 2016-12-22 DIAGNOSIS — R7989 Other specified abnormal findings of blood chemistry: Secondary | ICD-10-CM

## 2016-12-22 LAB — COMPREHENSIVE METABOLIC PANEL
ALBUMIN: 3.7 g/dL (ref 3.5–5.0)
ALK PHOS: 102 U/L (ref 38–126)
ALT: 10 U/L — ABNORMAL LOW (ref 17–63)
ANION GAP: 11 (ref 5–15)
AST: 12 U/L — ABNORMAL LOW (ref 15–41)
BUN: 40 mg/dL — ABNORMAL HIGH (ref 6–20)
CALCIUM: 8.6 mg/dL — AB (ref 8.9–10.3)
CHLORIDE: 101 mmol/L (ref 101–111)
CO2: 26 mmol/L (ref 22–32)
Creatinine, Ser: 8.83 mg/dL — ABNORMAL HIGH (ref 0.61–1.24)
GFR calc Af Amer: 6 mL/min — ABNORMAL LOW (ref >60)
GFR calc non Af Amer: 5 mL/min — ABNORMAL LOW (ref >60)
GLUCOSE: 150 mg/dL — AB (ref 65–99)
POTASSIUM: 3.9 mmol/L (ref 3.5–5.1)
SODIUM: 138 mmol/L (ref 135–145)
Total Bilirubin: 0.5 mg/dL (ref 0.3–1.2)
Total Protein: 7.7 g/dL (ref 6.5–8.1)

## 2016-12-22 LAB — CBC WITH DIFFERENTIAL/PLATELET
BASOS PCT: 1 %
Basophils Absolute: 0 10*3/uL (ref 0.0–0.1)
EOS ABS: 0.3 10*3/uL (ref 0.0–0.7)
Eosinophils Relative: 4 %
HCT: 34.4 % — ABNORMAL LOW (ref 39.0–52.0)
HEMOGLOBIN: 11.6 g/dL — AB (ref 13.0–17.0)
Lymphocytes Relative: 16 %
Lymphs Abs: 1.1 10*3/uL (ref 0.7–4.0)
MCH: 30.6 pg (ref 26.0–34.0)
MCHC: 33.7 g/dL (ref 30.0–36.0)
MCV: 90.8 fL (ref 78.0–100.0)
MONOS PCT: 13 %
Monocytes Absolute: 0.9 10*3/uL (ref 0.1–1.0)
NEUTROS PCT: 66 %
Neutro Abs: 4.5 10*3/uL (ref 1.7–7.7)
Platelets: 151 10*3/uL (ref 150–400)
RBC: 3.79 MIL/uL — ABNORMAL LOW (ref 4.22–5.81)
RDW: 15.7 % — ABNORMAL HIGH (ref 11.5–15.5)
WBC: 6.8 10*3/uL (ref 4.0–10.5)

## 2016-12-22 LAB — I-STAT ARTERIAL BLOOD GAS, ED
ACID-BASE EXCESS: 1 mmol/L (ref 0.0–2.0)
BICARBONATE: 26.3 mmol/L (ref 20.0–28.0)
O2 Saturation: 98 %
PO2 ART: 101 mmHg (ref 83.0–108.0)
Patient temperature: 98.3
TCO2: 28 mmol/L (ref 22–32)
pCO2 arterial: 42.5 mmHg (ref 32.0–48.0)
pH, Arterial: 7.398 (ref 7.350–7.450)

## 2016-12-22 LAB — I-STAT VENOUS BLOOD GAS, ED
BICARBONATE: 24.4 mmol/L (ref 20.0–28.0)
O2 SAT: 84 %
PCO2 VEN: 39.5 mmHg — AB (ref 44.0–60.0)
PO2 VEN: 49 mmHg — AB (ref 32.0–45.0)
Patient temperature: 98.3
TCO2: 26 mmol/L (ref 22–32)
pH, Ven: 7.398 (ref 7.250–7.430)

## 2016-12-22 LAB — TROPONIN I
TROPONIN I: 0.09 ng/mL — AB (ref <0.03)
TROPONIN I: 0.07 ng/mL — AB (ref <0.03)
Troponin I: 0.11 ng/mL (ref <0.03)
Troponin I: 0.1 ng/mL (ref <0.03)

## 2016-12-22 LAB — LIPASE, BLOOD: Lipase: 66 U/L — ABNORMAL HIGH (ref 11–51)

## 2016-12-22 LAB — ECHOCARDIOGRAM COMPLETE
HEIGHTINCHES: 67 in
Weight: 2123.47 oz

## 2016-12-22 LAB — GLUCOSE, CAPILLARY: Glucose-Capillary: 130 mg/dL — ABNORMAL HIGH (ref 65–99)

## 2016-12-22 LAB — MRSA PCR SCREENING: MRSA BY PCR: NEGATIVE

## 2016-12-22 MED ORDER — LORATADINE 10 MG PO TABS
10.0000 mg | ORAL_TABLET | Freq: Every day | ORAL | Status: DC
  Administered 2016-12-22 – 2016-12-25 (×4): 10 mg via ORAL
  Filled 2016-12-22 (×5): qty 1

## 2016-12-22 MED ORDER — SODIUM CHLORIDE 0.9% FLUSH
3.0000 mL | INTRAVENOUS | Status: DC | PRN
  Administered 2016-12-22: 3 mL via INTRAVENOUS
  Filled 2016-12-22: qty 3

## 2016-12-22 MED ORDER — ONDANSETRON HCL 4 MG/2ML IJ SOLN
4.0000 mg | Freq: Three times a day (TID) | INTRAMUSCULAR | Status: DC | PRN
  Filled 2016-12-22: qty 2

## 2016-12-22 MED ORDER — ASPIRIN 81 MG PO CHEW
81.0000 mg | CHEWABLE_TABLET | ORAL | Status: AC
  Administered 2016-12-23: 81 mg via ORAL

## 2016-12-22 MED ORDER — AMLODIPINE BESYLATE 5 MG PO TABS
5.0000 mg | ORAL_TABLET | Freq: Every day | ORAL | Status: DC
  Administered 2016-12-22: 5 mg via ORAL
  Filled 2016-12-22: qty 1

## 2016-12-22 MED ORDER — CALCIUM ACETATE (PHOS BINDER) 667 MG PO CAPS
667.0000 mg | ORAL_CAPSULE | Freq: Every day | ORAL | Status: DC
  Administered 2016-12-22 – 2016-12-25 (×4): 667 mg via ORAL
  Filled 2016-12-22 (×4): qty 1

## 2016-12-22 MED ORDER — MORPHINE SULFATE (PF) 2 MG/ML IV SOLN: 2.0000 mg | INTRAVENOUS | Status: DC | PRN

## 2016-12-22 MED ORDER — NICOTINE 21 MG/24HR TD PT24
21.0000 mg | MEDICATED_PATCH | Freq: Every day | TRANSDERMAL | Status: DC
  Administered 2016-12-22 – 2016-12-25 (×4): 21 mg via TRANSDERMAL
  Filled 2016-12-22 (×4): qty 1

## 2016-12-22 MED ORDER — CARVEDILOL 3.125 MG PO TABS
3.1250 mg | ORAL_TABLET | Freq: Two times a day (BID) | ORAL | Status: DC
  Administered 2016-12-22 – 2016-12-25 (×7): 3.125 mg via ORAL
  Filled 2016-12-22 (×7): qty 1

## 2016-12-22 MED ORDER — PENTAFLUOROPROP-TETRAFLUOROETH EX AERO: 1.0000 "application " | INHALATION_SPRAY | CUTANEOUS | Status: DC | PRN

## 2016-12-22 MED ORDER — SODIUM CHLORIDE 0.9 % IV SOLN
INTRAVENOUS | Status: DC
  Administered 2016-12-23: 05:00:00 via INTRAVENOUS

## 2016-12-22 MED ORDER — SODIUM CHLORIDE 0.9% FLUSH
3.0000 mL | Freq: Two times a day (BID) | INTRAVENOUS | Status: DC
  Administered 2016-12-22: 3 mL via INTRAVENOUS

## 2016-12-22 MED ORDER — SODIUM CHLORIDE 0.9 % IV SOLN: 250.0000 mL | INTRAVENOUS | Status: DC | PRN

## 2016-12-22 MED ORDER — ZOLPIDEM TARTRATE 5 MG PO TABS: 5.0000 mg | ORAL_TABLET | Freq: Every evening | ORAL | Status: DC | PRN

## 2016-12-22 MED ORDER — NITROGLYCERIN 0.4 MG SL SUBL: 0.4000 mg | SUBLINGUAL_TABLET | SUBLINGUAL | Status: DC | PRN

## 2016-12-22 MED ORDER — FAMOTIDINE 20 MG PO TABS
20.0000 mg | ORAL_TABLET | Freq: Every day | ORAL | Status: DC
  Administered 2016-12-23 – 2016-12-25 (×3): 20 mg via ORAL
  Filled 2016-12-22 (×3): qty 1

## 2016-12-22 MED ORDER — HEPARIN SODIUM (PORCINE) 5000 UNIT/ML IJ SOLN
5000.0000 [IU] | Freq: Three times a day (TID) | INTRAMUSCULAR | Status: DC
  Administered 2016-12-22 – 2016-12-24 (×8): 5000 [IU] via SUBCUTANEOUS
  Filled 2016-12-22 (×8): qty 1

## 2016-12-22 MED ORDER — RENA-VITE PO TABS
1.0000 | ORAL_TABLET | Freq: Every day | ORAL | Status: DC
  Administered 2016-12-22 – 2016-12-25 (×4): 1 via ORAL
  Filled 2016-12-22 (×4): qty 1

## 2016-12-22 MED ORDER — FAMOTIDINE 20 MG PO TABS: 20.0000 mg | ORAL_TABLET | Freq: Two times a day (BID) | ORAL | Status: DC

## 2016-12-22 MED ORDER — ACETAMINOPHEN 325 MG PO TABS
650.0000 mg | ORAL_TABLET | Freq: Four times a day (QID) | ORAL | Status: DC | PRN
Start: 2016-12-22 — End: 2016-12-25

## 2016-12-22 MED ORDER — HEPARIN SODIUM (PORCINE) 1000 UNIT/ML DIALYSIS: 1000.0000 [IU] | INTRAMUSCULAR | Status: DC | PRN

## 2016-12-22 MED ORDER — CLOPIDOGREL BISULFATE 75 MG PO TABS
75.0000 mg | ORAL_TABLET | Freq: Every day | ORAL | Status: DC
  Administered 2016-12-22: 75 mg via ORAL
  Filled 2016-12-22: qty 1

## 2016-12-22 MED ORDER — HYDRALAZINE HCL 20 MG/ML IJ SOLN: 5.0000 mg | INTRAMUSCULAR | Status: DC | PRN

## 2016-12-22 MED ORDER — LABETALOL HCL 5 MG/ML IV SOLN
20.0000 mg | Freq: Once | INTRAVENOUS | Status: AC
  Administered 2016-12-22: 20 mg via INTRAVENOUS
  Filled 2016-12-22: qty 4

## 2016-12-22 MED ORDER — SODIUM CHLORIDE 0.9 % IV SOLN: 100.0000 mL | INTRAVENOUS | Status: DC | PRN

## 2016-12-22 MED ORDER — LIDOCAINE HCL (PF) 1 % IJ SOLN: 5.0000 mL | INTRAMUSCULAR | Status: DC | PRN

## 2016-12-22 MED ORDER — ALTEPLASE 2 MG IJ SOLR: 2.0000 mg | Freq: Once | INTRAMUSCULAR | Status: DC | PRN

## 2016-12-22 MED ORDER — LISINOPRIL 10 MG PO TABS: 10.0000 mg | ORAL_TABLET | Freq: Every day | ORAL | Status: DC

## 2016-12-22 MED ORDER — LIDOCAINE-PRILOCAINE 2.5-2.5 % EX CREA: 1.0000 "application " | TOPICAL_CREAM | CUTANEOUS | Status: DC | PRN

## 2016-12-22 MED ORDER — ROSUVASTATIN CALCIUM 20 MG PO TABS
40.0000 mg | ORAL_TABLET | Freq: Every day | ORAL | Status: DC
Start: 2016-12-22 — End: 2016-12-25
  Administered 2016-12-22 – 2016-12-25 (×4): 40 mg via ORAL
  Filled 2016-12-22 (×4): qty 2

## 2016-12-22 MED ORDER — VENLAFAXINE HCL ER 37.5 MG PO CP24
37.5000 mg | ORAL_CAPSULE | Freq: Every day | ORAL | Status: DC
  Administered 2016-12-22 – 2016-12-25 (×4): 37.5 mg via ORAL
  Filled 2016-12-22 (×4): qty 1

## 2016-12-22 MED ORDER — LISINOPRIL 2.5 MG PO TABS: 2.5000 mg | ORAL_TABLET | Freq: Every day | ORAL | Status: DC

## 2016-12-22 MED ORDER — GABAPENTIN 300 MG PO CAPS
300.0000 mg | ORAL_CAPSULE | Freq: Every day | ORAL | Status: DC
  Administered 2016-12-22 – 2016-12-24 (×3): 300 mg via ORAL
  Filled 2016-12-22 (×3): qty 1

## 2016-12-22 NOTE — Progress Notes (Signed)
Patient alert making complete sentences ,  HR 89, RR 16, BP 140/87, satting 97% on RA. BS diminished.  Bipap not indicated at this time.  RT will continue to monitor.

## 2016-12-22 NOTE — ED Notes (Addendum)
Pt wife leaving,  phone number in case of emergency: Froilan Mclean [phone].

## 2016-12-22 NOTE — ED Provider Notes (Signed)
[facility] Provider Note: Georgena Spurling, MD, FACEP  CSN: [id number] MRN: [id number] ARRIVAL: 12/22/16 at [place]: [place] of [facility]  Level V caveat: Respiratory distress; acute need for intervention HISTORY OF PRESENT ILLNESS  12/22/16 12:22 AM [name] is a 71 y.o. male with end-stage renal disease on hemodialysis. He is here with shortness of breath that came on acutely about an hour prior to arrival. He is also complaining of left upper quadrant abdominal pain that started at the same time. Symptoms are moderate to severe. He feels like he is unable to pull an adequate amount of air into his lungs. He has a history of flash pulmonary edema requiring BiPAP in the past. He was coughing up white frothy sputum prior to arrival. He was last dialyzed October 5.   Past Medical History:  Diagnosis Date  . 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 Anemia, iron deficiency 03/24/2011   a. Recurrent GI bleed, tx with periodic iron infusions.  . 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 AVM (arteriovenous malformation) of duodenum, acquired    egds in 01/2012, 04/2011  . BPH (benign prostatic hyperplasia)   . CAD (coronary artery disease)    a. Cath 09/2012: moderate borderline CAD in mid LAD/small diagonal branch, mild RCA stenosis, to be managed medically   . Chronic lower back pain   . Diabetic peripheral neuropathy ([place]) 2014   foot pain.  . Essential hypertension 02/01/2012  . Fatty liver    on CT of 11/2010  . GERD (gastroesophageal reflux disease)   . GI bleed    a. Recurrent GI bleed, tx with IV iron. b. Per heme notes - likely AVMs 04/2012 (tx with cauterization several months ago).  . Hematuria    a. Urology note scan from 07/2012: cystoscopy without evidence for bladder lesion, only lateral hypertrophy of posterior urethra, bladder impression from BPH. b. Pt states he had "some tests" scheduled for later in July 2014.  . High  cholesterol   . Hyperlipidemia 02/01/2012  . Hypertension   . Hypertensive urgency 07/06/2016  . 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 Orthostatic hypotension    a. Tx with florinef.  . Peripheral neuropathy   . Polyp, colonic    Colonoscopy 01/2012 "benign" polyp  . Prostate cancer ([place])   . Sleep apnea    "had mask; couldn't sleep in it" (09/20/2012)  . Smoker   . SOB (shortness of breath) 07/06/2016  . Stage III chronic kidney disease ([name])    a. Stage 3 (DM with complications ->CKD, peripheral neuropathy).  . Stroke ([place])    x 2  . Symptomatic anemia 04/03/2015  . Tinea pedis 05/24/2012  . Tobacco use 02/01/2012  . Type 2 diabetes, uncontrolled, with neuropathy ([place]) 02/01/2012  . Type II diabetes mellitus ([place])    a. Dx 1994, uncontrolled.     Past Surgical History:  Procedure Laterality Date  . ENTEROSCOPY N/A 03/01/2013   Procedure: ENTEROSCOPY;  Surgeon: Inda Castle, MD;  Location: [facility];  Service: Endoscopy;  Laterality: N/A;  . ENTEROSCOPY N/A 06/26/2016   Procedure: ENTEROSCOPY;  Surgeon: Milus Banister, MD;  Location: [facility];  Service: Endoscopy;  Laterality: N/A;  this is a push enteroscopy  . ENTEROSCOPY N/A 10/23/2016   Procedure: ENTEROSCOPY;  Surgeon: Gatha Mayer, MD;  Location: [facility] ENDOSCOPY;  Service: Endoscopy;  Laterality: N/A;  . GIVENS CAPSULE STUDY N/A 04/05/2015   Procedure: GIVENS  CAPSULE STUDY;  Surgeon: Gatha Mayer, MD;  Location: [facility];  Service: Endoscopy;  Laterality: N/A;  . INGUINAL HERNIA REPAIR Right 2011  . LEFT HEART CATHETERIZATION WITH CORONARY ANGIOGRAM N/A 09/21/2012   Procedure: LEFT HEART CATHETERIZATION WITH CORONARY ANGIOGRAM;  Surgeon: Peter M Martinique, MD;  Location: [facility] CATH LAB;  Service: Cardiovascular;  Laterality: N/A;  . LUMBAR DISC SURGERY  1980's  . LYMPHADENECTOMY Bilateral 01/19/2013   Procedure: LYMPHADENECTOMY;  Surgeon: Bernestine Amass, MD;  Location: [facility];  Service: Urology;   Laterality: Bilateral;  . ROBOT ASSISTED LAPAROSCOPIC RADICAL PROSTATECTOMY N/A 01/19/2013   Procedure: ROBOTIC ASSISTED LAPAROSCOPIC RADICAL PROSTATECTOMY;  Surgeon: Bernestine Amass, MD;  Location: [facility];  Service: Urology;  Laterality: N/A;  . SHOULDER OPEN ROTATOR CUFF REPAIR Left 1980's    Family History  Problem Relation Age of Onset  . Stroke Father        Died at 36  . Diabetes Mother   . Heart attack Brother        Died at 37  . Diabetes Sister   . Stomach cancer Brother   . Heart attack Sister     Social History  Substance Use Topics  . Smoking status: Former Smoker    Packs/day: 1.00    Years: 30.00    Types: Cigarettes    Start date: 04/15/1968  . Smokeless tobacco: Never Used  . Alcohol use No     Comment: 09/20/2012 "Used to; stopped ~ 2009; never had problem w/it"    Prior to Admission medications   Medication Sig Start Date End Date Taking? Authorizing Provider  amLODipine (NORVASC) 5 MG tablet Take 1 tablet (5 mg total) by mouth daily. 10/24/16   Dhungel, Flonnie Overman, MD  Calcium Acetate 668 (169 Ca) MG TABS Take 1 tablet by mouth daily.    [provider]  carvedilol (COREG) 3.125 MG tablet TAKE 1 TABLET BY MOUTH TWICE DAILY WITH MEALS 12/16/16   Saguier, Percell Miller, PA-C  cetirizine (ZYRTEC ALLERGY) 10 MG tablet Take 1 tablet (10 mg total) by mouth daily. 09/18/16   Emeline General, PA-C  clopidogrel (PLAVIX) 75 MG tablet Take 1 tablet (75 mg total) by mouth daily. 10/31/16   Dhungel, Flonnie Overman, MD  gabapentin (NEURONTIN) 300 MG capsule Take 1 capsule (300 mg total) by mouth at bedtime. Patient taking differently: Take 300 mg by mouth 3 (three) times daily.  07/07/16   Domenic Polite, MD  multivitamin (RENA-VIT) TABS tablet Take 1 tablet by mouth daily.    [provider]  nicotine (NICODERM CQ - DOSED IN MG/24 HOURS) 21 mg/24hr patch Place 1 patch (21 mg total) onto the skin daily. 10/25/16   Dhungel, Flonnie Overman, MD  ranitidine (ZANTAC) 150 MG capsule Take 1  capsule (150 mg total) by mouth 2 (two) times daily. 11/11/16   Saguier, Percell Miller, PA-C  rosuvastatin (CRESTOR) 40 MG tablet TAKE 1 TABLET BY MOUTH EVERY DAY 10/28/16   Saguier, Percell Miller, PA-C  venlafaxine XR (EFFEXOR-XR) 37.5 MG 24 hr capsule Take 1 capsule (37.5 mg total) by mouth daily with breakfast. 11/11/16   Saguier, Percell Miller, PA-C    Allergies Patient has no known allergies.   REVIEW OF SYSTEMS  Negative except as noted here or in the History of Present Illness.   PHYSICAL EXAMINATION  Initial Vital Signs Blood pressure (!) 166/111, pulse (!) 122, temperature 98.3 F (36.8 C), temperature source Oral, resp. rate (!) 37, SpO2 (!) 82 %.  Examination General: Well-developed, well-nourished male in mild  to moderate distress; appearance consistent with age of record HENT: normocephalic; atraumatic Eyes: pupils equal, round and reactive to light; extraocular muscles intact Neck: supple Heart: regular rate and rhythm; tachycardia Lungs: Tachypnea; rales in bases bilaterally decreased air movement; accessory muscle use Abdomen: soft; nondistended; epigastric and left upper quadrant tenderness; no masses or hepatosplenomegaly; bowel sounds present Extremities: No deformity; full range of motion; pulses normal; no edema; dialysis fistula in left forearm with pulse and thrill Neurologic: Awake, alert; right hemiparesis; no facial droop Skin: Warm and dry Psychiatric: Flat affect   RESULTS  Summary of this visit's results, reviewed by myself:   EKG Interpretation  Date/Time:  Monday December 22 2016 00:23:01 EDT Ventricular Rate:  122 PR Interval:    QRS Duration: 99 QT Interval:  330 QTC Calculation: 471 R Axis:   -14 Text Interpretation:  Sinus tachycardia LAE, consider biatrial enlargement Left ventricular hypertrophy Anterior Q waves, possibly due to LVH T wave inversions less pronounced Rate is faster Confirmed by Cortlyn Cannell [phone]) on 12/22/2016 12:48:11 AM       EKG  Interpretation  Date/Time:  Monday December 22 2016 00:52:06 EDT Ventricular Rate:  91 PR Interval:    QRS Duration: 111 QT Interval:  409 QTC Calculation: 504 R Axis:   18 Text Interpretation:  Sinus rhythm LAE, consider biatrial enlargement LVH with secondary repolarization abnormality Anterior Q waves, possibly due to LVH Prolonged QT interval Rate is slower T wave inversions more pronounced Confirmed by Calton Harshfield, Jenny Reichmann [phone]) on 12/22/2016 12:54:30 AM       Laboratory Studies: Results for orders placed or performed during the hospital encounter of 12/22/16 (from the past 24 hour(s))  CBC with Differential/Platelet     Status: Abnormal   Collection Time: 12/22/16 12:52 AM  Result Value Ref Range   WBC 6.8 4.0 - 10.5 K/uL   RBC 3.79 (L) 4.22 - 5.81 MIL/uL   Hemoglobin 11.6 (L) 13.0 - 17.0 g/dL   HCT 34.4 (L) 39.0 - 52.0 %   MCV 90.8 78.0 - 100.0 fL   MCH 30.6 26.0 - 34.0 pg   MCHC 33.7 30.0 - 36.0 g/dL   RDW 15.7 (H) 11.5 - 15.5 %   Platelets 151 150 - 400 K/uL   Neutrophils Relative % 66 %   Neutro Abs 4.5 1.7 - 7.7 K/uL   Lymphocytes Relative 16 %   Lymphs Abs 1.1 0.7 - 4.0 K/uL   Monocytes Relative 13 %   Monocytes Absolute 0.9 0.1 - 1.0 K/uL   Eosinophils Relative 4 %   Eosinophils Absolute 0.3 0.0 - 0.7 K/uL   Basophils Relative 1 %   Basophils Absolute 0.0 0.0 - 0.1 K/uL  Comprehensive metabolic panel     Status: Abnormal   Collection Time: 12/22/16 12:52 AM  Result Value Ref Range   Sodium 138 135 - 145 mmol/L   Potassium 3.9 3.5 - 5.1 mmol/L   Chloride 101 101 - 111 mmol/L   CO2 26 22 - 32 mmol/L   Glucose, Bld 150 (H) 65 - 99 mg/dL   BUN 40 (H) 6 - 20 mg/dL   Creatinine, Ser 8.83 (H) 0.61 - 1.24 mg/dL   Calcium 8.6 (L) 8.9 - 10.3 mg/dL   Total Protein 7.7 6.5 - 8.1 g/dL   Albumin 3.7 3.5 - 5.0 g/dL   AST 12 (L) 15 - 41 U/L   ALT 10 (L) 17 - 63 U/L   Alkaline Phosphatase 102 38 - 126 U/L   Total  Bilirubin 0.5 0.3 - 1.2 mg/dL   GFR calc non Af Amer 5 (L)  >60 mL/min   GFR calc Af Amer 6 (L) >60 mL/min   Anion gap 11 5 - 15  Lipase, blood     Status: Abnormal   Collection Time: 12/22/16 12:52 AM  Result Value Ref Range   Lipase 66 (H) 11 - 51 U/L  Troponin I     Status: Abnormal   Collection Time: 12/22/16 12:53 AM  Result Value Ref Range   Troponin I 0.11 (HH) <0.03 ng/mL  I-Stat arterial blood gas, ED     Status: None   Collection Time: 12/22/16  1:07 AM  Result Value Ref Range   pH, Arterial 7.398 7.350 - 7.450   pCO2 arterial 42.5 32.0 - 48.0 mmHg   pO2, Arterial 101.0 83.0 - 108.0 mmHg   Bicarbonate 26.3 20.0 - 28.0 mmol/L   TCO2 28 22 - 32 mmol/L   O2 Saturation 98.0 %   Acid-Base Excess 1.0 0.0 - 2.0 mmol/L   Patient temperature 98.3 F    Collection site IV START    Drawn by RT    Sample type ARTERIAL    Imaging Studies: Dg Chest Port 1 View  Result Date: 12/22/2016 CLINICAL DATA:  Acute respiratory distress tonight. EXAM: PORTABLE CHEST 1 VIEW COMPARISON:  10/21/2016 FINDINGS: Diffuse interstitial fluid or thickening, new. No airspace consolidation. There is a trace fluid in the right minor fissure, but no large effusion. Hilar, mediastinal and cardiac contours are unremarkable and unchanged. IMPRESSION: Diffuse interstitial fluid or interstitial infiltrate. Electronically Signed   By: Andreas Newport M.D.   On: 12/22/2016 00:58    ED COURSE  Nursing notes and initial vitals signs, including pulse oximetry, reviewed.  Vitals:   12/22/16 0045 12/22/16 0115 12/22/16 0130 12/22/16 0145  BP: 114/82 133/84 123/80 (!) 135/91  Pulse: 90 89 87 87  Resp: (!) 22 20 17 13   Temp:      TempSrc:      SpO2: 97% 98% 100% 100%   12:50 AM Patient placed on BiPAP. Hypertension and tachycardia improved after IV labetalol. Oxygen saturation 99%.  1:36 AM Patient stable on BiPAP. Denies abdominal pain at this time.   1:55 AM Dr. Blaine Hamper accepts for transfer to [facility].  PROCEDURES   CRITICAL CARE Performed by: Shanon Rosser  L Total critical care time: 45 minutes Critical care time was exclusive of separately billable procedures and treating other patients. Critical care was necessary to treat or prevent imminent or life-threatening deterioration. Critical care was time spent personally by me on the following activities: development of treatment plan with patient and/or surrogate as well as nursing, discussions with consultants, evaluation of patient's response to treatment, examination of patient, obtaining history from patient or surrogate, ordering and performing treatments and interventions, ordering and review of laboratory studies, ordering and review of radiographic studies, pulse oximetry and re-evaluation of patient's condition.   ED DIAGNOSES     ICD-10-CM   1. Flash pulmonary edema ([name]) J81.0   2. Hypertensive crisis I16.9   3. Elevated troponin I level R74.8         Raynell Scott, Jenny Reichmann, MD 12/22/16 [phone]

## 2016-12-22 NOTE — Procedures (Signed)
I have seen and examined this patient and agree with the plan of care Patient was seen on dialysis  Does not have any complaints  - awaiting result of 2D Echo   BP 160/90  Goal 3 L AVF L no issues on HD Qb 350  Trop  0.11   0.10  Hb 11.6    K 3.9  CO2 26   CXR  Diffuse interstitial Fluid        Tige Meas W 12/22/2016, 8:36 AM

## 2016-12-22 NOTE — Consult Note (Signed)
Renal Service Consult Note [place] 12/22/2016 [place] Requesting Physician:  Dr Blaine Hamper  Reason for Consult:  Dyspnea, ESRD HPI: [name] is a 71 y.o. male with ESRD, CAD, Type 2 DM, prostate cancer, Hx CVA, and Hx GI bleed (due to AVMs), combined HF who was admitted this am with SOB and pulm edema.  Was started on bipap and transferred to [facility] for dialysis.    Dialyzes MWF at [facility] in [facility].  The ED MD said that "all his other doctors are at [facility]" which is why they sent him here.    Patient said became SOB earlier tonight, came on all of a sudden, +orthopnea , no CP.  Improved now.  No abd pain, n/v/d, no CP.  No f/c/s.      ROS  denies CP  no joint pain   no HA  no blurry vision  no rash  no diarrhea  no nausea/ vomiting     Past Medical History  Past Medical History:  Diagnosis Date  . 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 Anemia, iron deficiency 03/24/2011   a. Recurrent GI bleed, tx with periodic iron infusions.  . 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 AVM (arteriovenous malformation) of duodenum, acquired    egds in 01/2012, 04/2011  . BPH (benign prostatic hyperplasia)   . CAD (coronary artery disease)    a. Cath 09/2012: moderate borderline CAD in mid LAD/small diagonal branch, mild RCA stenosis, to be managed medically   . Chronic lower back pain   . Diabetic peripheral neuropathy ([place]) 2014   foot pain.  . Essential hypertension 02/01/2012  . Fatty liver    on CT of 11/2010  . GERD (gastroesophageal reflux disease)   . GI bleed    a. Recurrent GI bleed, tx with IV iron. b. Per heme notes - likely AVMs 04/2012 (tx with cauterization several months ago).  . Hematuria    a. Urology note scan from 07/2012: cystoscopy without evidence for bladder lesion, only lateral hypertrophy of posterior urethra, bladder impression from BPH. b. Pt states he had "some tests" scheduled for later in July 2014.  . High  cholesterol   . Hyperlipidemia 02/01/2012  . Hypertension   . Hypertensive urgency 07/06/2016  . 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 Orthostatic hypotension    a. Tx with florinef.  . Peripheral neuropathy   . Polyp, colonic    Colonoscopy 01/2012 "benign" polyp  . Prostate cancer ([place])   . Sleep apnea    "had mask; couldn't sleep in it" (09/20/2012)  . Smoker   . SOB (shortness of breath) 07/06/2016  . Stage III chronic kidney disease ([name])    a. Stage 3 (DM with complications ->CKD, peripheral neuropathy).  . Stroke ([place])    x 2  . Symptomatic anemia 04/03/2015  . Tinea pedis 05/24/2012  . Tobacco use 02/01/2012  . Type 2 diabetes, uncontrolled, with neuropathy ([place]) 02/01/2012  . Type II diabetes mellitus ([place])    a. Dx 1994, uncontrolled.    Past Surgical History  Past Surgical History:  Procedure Laterality Date  . ENTEROSCOPY N/A 03/01/2013   Procedure: ENTEROSCOPY;  Surgeon: Inda Castle, MD;  Location: [place];  Service: Endoscopy;  Laterality: N/A;  . ENTEROSCOPY N/A 06/26/2016   Procedure: ENTEROSCOPY;  Surgeon: Milus Banister, MD;  Location: [place];  Service: Endoscopy;  Laterality: N/A;  this is a push enteroscopy  . ENTEROSCOPY  N/A 10/23/2016   Procedure: ENTEROSCOPY;  Surgeon: Gatha Mayer, MD;  Location: [place];  Service: Endoscopy;  Laterality: N/A;  . GIVENS CAPSULE STUDY N/A 04/05/2015   Procedure: GIVENS CAPSULE STUDY;  Surgeon: Gatha Mayer, MD;  Location: [place];  Service: Endoscopy;  Laterality: N/A;  . INGUINAL HERNIA REPAIR Right 2011  . LEFT HEART CATHETERIZATION WITH CORONARY ANGIOGRAM N/A 09/21/2012   Procedure: LEFT HEART CATHETERIZATION WITH CORONARY ANGIOGRAM;  Surgeon: Peter M Martinique, MD;  Location: [facility] CATH LAB;  Service: Cardiovascular;  Laterality: N/A;  . LUMBAR DISC SURGERY  1980's  . LYMPHADENECTOMY Bilateral 01/19/2013   Procedure: LYMPHADENECTOMY;  Surgeon: Bernestine Amass, MD;  [facility];   Service: Urology;  Laterality: Bilateral;  . ROBOT ASSISTED LAPAROSCOPIC RADICAL PROSTATECTOMY N/A 01/19/2013   Procedure: ROBOTIC ASSISTED LAPAROSCOPIC RADICAL PROSTATECTOMY;  Surgeon: Bernestine Amass, MD;  [facility];  Service: Urology;  Laterality: N/A;  . SHOULDER OPEN ROTATOR CUFF REPAIR Left 1980's   Family History  Family History  Problem Relation Age of Onset  . Stroke Father        Died at 51  . Diabetes Mother   . Heart attack Brother        Died at 65  . Diabetes Sister   . Stomach cancer Brother   . Heart attack Sister    Social History  reports that he has quit smoking. His smoking use included Cigarettes. He started smoking about 48 years ago. He has a 30.00 pack-year smoking history. He has never used smokeless tobacco. He reports that he does not drink alcohol or use drugs. Allergies No Known Allergies Home medications Prior to Admission medications   Medication Sig Start Date End Date Taking? Authorizing Provider  amLODipine (NORVASC) 5 MG tablet Take 1 tablet (5 mg total) by mouth daily. 10/24/16   Dhungel, Flonnie Overman, MD  Calcium Acetate 668 (169 Ca) MG TABS Take 1 tablet by mouth daily.    [provider]  carvedilol (COREG) 3.125 MG tablet TAKE 1 TABLET BY MOUTH TWICE DAILY WITH MEALS 12/16/16   Saguier, Percell Miller, PA-C  cetirizine (ZYRTEC ALLERGY) 10 MG tablet Take 1 tablet (10 mg total) by mouth daily. 09/18/16   Emeline General, PA-C  clopidogrel (PLAVIX) 75 MG tablet Take 1 tablet (75 mg total) by mouth daily. 10/31/16   Dhungel, Flonnie Overman, MD  gabapentin (NEURONTIN) 300 MG capsule Take 1 capsule (300 mg total) by mouth at bedtime. Patient taking differently: Take 300 mg by mouth 3 (three) times daily.  07/07/16   Domenic Polite, MD  multivitamin (RENA-VIT) TABS tablet Take 1 tablet by mouth daily.    [provider]  nicotine (NICODERM CQ - DOSED IN MG/24 HOURS) 21 mg/24hr patch Place 1 patch (21 mg total) onto the skin daily. 10/25/16   Dhungel,  Flonnie Overman, MD  ranitidine (ZANTAC) 150 MG capsule Take 1 capsule (150 mg total) by mouth 2 (two) times daily. 11/11/16   Saguier, Percell Miller, PA-C  rosuvastatin (CRESTOR) 40 MG tablet TAKE 1 TABLET BY MOUTH EVERY DAY 10/28/16   Saguier, Percell Miller, PA-C  venlafaxine XR (EFFEXOR-XR) 37.5 MG 24 hr capsule Take 1 capsule (37.5 mg total) by mouth daily with breakfast. 11/11/16   Mackie Pai, PA-C   Liver Function Tests  Recent Labs Lab 12/22/16 0052  AST 12*  ALT 10*  ALKPHOS 102  BILITOT 0.5  PROT 7.7  ALBUMIN 3.7    Recent Labs Lab 12/22/16 0052  LIPASE 66*   CBC  Recent Labs Lab 12/22/16 0052  WBC 6.8  NEUTROABS 4.5  HGB 11.6*  HCT 34.4*  MCV 90.8  PLT 482   Basic Metabolic Panel  Recent Labs Lab 12/22/16 0052  NA 138  K 3.9  CL 101  CO2 26  GLUCOSE 150*  BUN 40*  CREATININE 8.83*  CALCIUM 8.6*   Iron/TIBC/Ferritin/ %Sat    Component Value Date/Time   IRON 49 11/06/2016 1303   TIBC 186 (L) 11/06/2016 1303   FERRITIN 732 (H) 11/06/2016 1303   IRONPCTSAT 26 11/06/2016 1303   IRONPCTSAT 8 (L) 06/29/2014 0811    Vitals:   12/22/16 0315 12/22/16 0330 12/22/16 0338 12/22/16 0415  BP: (!) 146/94 (!) 141/88  140/87  Pulse: 87 85 86 91  Resp: 13 14 15 17   Temp:    98.3 F (36.8 C)  TempSrc:    Oral  SpO2: 100% 100% 100% 94%  Weight:    79.8 kg (175 lb 14.8 oz)  Height:    5\' 7"  (1.702 m)   Exam Gen alert, thin [name], not in distress No rash, cyanosis or gangrene Sclera anicteric, throat clear  No jvd or bruits Chest rales bilat bases, no wheezing RRR no MRG Abd soft ntnd no mass or ascites +bs GU normal male MS no joint effusions or deformity Ext trace LE edema / no wounds or ulcers Neuro is alert, Ox 3 , nf    Dialysis: MWF at [facility] in [place] 4h   350/700   Hep none  2K/3 Ca bath  LFA AVF    Impression: 1. Acute pulm edema/ SOB - sp bipap, now on RA, plan is for HD this am next pt.   2. ESRD on HD MWF , [facility] 3. HTN - cont  meds 4. Hx CVA 5. Anemia of CKD - Hb 11.6    Plan - HD w/ max UF, use L AVF, no heparin.   Kelly Splinter MD [employer] pager [phone]   12/22/2016, 5:01 AM

## 2016-12-22 NOTE — Consult Note (Signed)
Cardiology Consultation:   Patient ID: [name]; [id number]; [birth year]   Admit date: 12/22/2016 Date of Consult: 12/22/2016  Primary Care Provider: Mackie Pai, PA-C Primary Cardiologist: Dr. Irish Lack   Patient Profile:   [name] is a 71 y.o. male with a hx of CAD (medically managed), multiple CVA and prior GI bleed, end-stage renal disease on hemodialysis, hypertension, hyperlipidemia and diabetes who is being seen today for the evaluation of CHF at the request of  Dr. Algis Liming.  HIs LVEF was 35-40% in 2014.  He had a left heart cath showing a 70% mid LAD lesion (detailed below). This was managed medically due to prior GI bleed. Had endoscopy in 4/18 and was cleared to start back aspirin and plavix.   Last echocardiogram 06/2016 showed LVEF of 45 to 50% With diffuse hypokinesis. EF mildly reduced when compared to prior.  He was doing well on cardiac stand point when last seen by Dr. Irish Lack 08/06/16.  History of Present Illness:   [name] had sudden onset shortness of breath last night around 1 PM. He was sitting in chair. Asssociated with mild chest discomfort. EMS was called due to worsening of shortness of breath. EMS found him hypoxic and placed on BiPAP with improved symptoms. His blood pressure was elevated at 166/111 at presentation. Patient was admitted and given IV labetalol. Currently denies chest pain or shortness of breath.  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 No chest pain or shortness of breath. He denies orthopnea, PND, syncope, lower extremity edema, dizziness, melena or blood in his stool or urine. Currently he is trying to quit tobacco smoking.  Troponin 0.11-->0.1-->0.09 EKG:  The EKG was personally reviewed and demonstrates:  Sinus rhythm with LVH and T-wave inversion in lateral lead. Compared to prior EKG 10/21/16 T-wave inversion in inferior lead resolved. Telemetry:  Telemetry was personally reviewed and demonstrates: sinus rhythm with  controlled ventricular rate  Echocardiogram this admission showed further reduced EF of 20-25%with diffuse hypokinesis and grade 1 diastolic dysfunction, moderate LVH, trace MR and TR. Cadiology is asked for further management of CHF. Past Medical History:  Diagnosis Date  . 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 Anemia, iron deficiency 03/24/2011   a. Recurrent GI bleed, tx with periodic iron infusions.  . 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 AVM (arteriovenous malformation) of duodenum, acquired    egds in 01/2012, 04/2011  . BPH (benign prostatic hyperplasia)   . CAD (coronary artery disease)    a. Cath 09/2012: moderate borderline CAD in mid LAD/small diagonal branch, mild RCA stenosis, to be managed medically   . Chronic lower back pain   . Diabetic peripheral neuropathy ([place]) 2014   foot pain.  . Essential hypertension 02/01/2012  . Fatty liver    on CT of 11/2010  . GERD (gastroesophageal reflux disease)   . GI bleed    a. Recurrent GI bleed, tx with IV iron. b. Per heme notes - likely AVMs 04/2012 (tx with cauterization several months ago).  . Hematuria    a. Urology note scan from 07/2012: cystoscopy without evidence for bladder lesion, only lateral hypertrophy of posterior urethra, bladder impression from BPH. b. Pt states he had "some tests" scheduled for later in July 2014.  . High cholesterol   . Hyperlipidemia 02/01/2012  . Hypertension   . Hypertensive urgency 07/06/2016  . 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 Orthostatic hypotension    a. Tx with florinef.  . Peripheral neuropathy   .  Polyp, colonic    Colonoscopy 01/2012 "benign" polyp  . Prostate cancer ([place])   . Sleep apnea    "had mask; couldn't sleep in it" (09/20/2012)  . Smoker   . SOB (shortness of breath) 07/06/2016  . Stage III chronic kidney disease ([name])    a. Stage 3 (DM with complications ->CKD, peripheral neuropathy).  . Stroke ([place])    x 2  . Symptomatic anemia 04/03/2015  .  Tinea pedis 05/24/2012  . Tobacco use 02/01/2012  . Type 2 diabetes, uncontrolled, with neuropathy ([place]) 02/01/2012  . Type II diabetes mellitus ([place])    a. Dx 1994, uncontrolled.     Past Surgical History:  Procedure Laterality Date  . ENTEROSCOPY N/A 03/01/2013   Procedure: ENTEROSCOPY;  Surgeon: Inda Castle, MD;  Location: [place];  Service: Endoscopy;  Laterality: N/A;  . ENTEROSCOPY N/A 06/26/2016   Procedure: ENTEROSCOPY;  Surgeon: Milus Banister, MD;  Location: [facility];  Service: Endoscopy;  Laterality: N/A;  this is a push enteroscopy  . ENTEROSCOPY N/A 10/23/2016   Procedure: ENTEROSCOPY;  Surgeon: Gatha Mayer, MD;  Location: [facility] ENDOSCOPY;  Service: Endoscopy;  Laterality: N/A;  . GIVENS CAPSULE STUDY N/A 04/05/2015   Procedure: GIVENS CAPSULE STUDY;  Surgeon: Gatha Mayer, MD;  Location: [place];  Service: Endoscopy;  Laterality: N/A;  . INGUINAL HERNIA REPAIR Right 2011  . LEFT HEART CATHETERIZATION WITH CORONARY ANGIOGRAM N/A 09/21/2012   Procedure: LEFT HEART CATHETERIZATION WITH CORONARY ANGIOGRAM;  Surgeon: Peter M Martinique, MD;  Location: [facility] C[facility] LAB;  Service: Cardiovascular;  Laterality: N/A;  . LUMBAR DISC SURGERY  1980's  . LYMPHADENECTOMY Bilateral 01/19/2013   Procedure: LYMPHADENECTOMY;  Surgeon: Bernestine Amass, MD;  Location: [facility];  Service: Urology;  Laterality: Bilateral;  . ROBOT ASSISTED LAPAROSCOPIC RADICAL PROSTATECTOMY N/A 01/19/2013   Procedure: ROBOTIC ASSISTED LAPAROSCOPIC RADICAL PROSTATECTOMY;  Surgeon: Bernestine Amass, MD;  Location: [facility];  Service: Urology;  Laterality: N/A;  . SHOULDER OPEN ROTATOR CUFF REPAIR Left 1980's       Inpatient Medications: Scheduled Meds: . amLODipine  5 mg Oral Daily  . calcium acetate  667 mg Oral Q breakfast  . carvedilol  3.125 mg Oral BID WC  . clopidogrel  75 mg Oral Daily  . [START ON 12/23/2016] famotidine  20 mg Oral Daily  . gabapentin  300 mg Oral QHS  . heparin  5,000 Units  Subcutaneous Q8H  . loratadine  10 mg Oral Daily  . multivitamin  1 tablet Oral Daily  . nicotine  21 mg Transdermal Daily  . rosuvastatin  40 mg Oral Daily  . venlafaxine XR  37.5 mg Oral Q breakfast   Continuous Infusions:  PRN Meds: acetaminophen, hydrALAZINE, morphine injection, nitroGLYCERIN, ondansetron (ZOFRAN) IV, zolpidem  Allergies:   No Known Allergies  Social History:   Social History   Social History  . Marital status: Married    Spouse name: N/A  . Number of children: 2  . Years of education: N/A   Occupational History  . taken out of work due to back    Social History Main Topics  . Smoking status: Former Smoker    Packs/day: 1.00    Years: 30.00    Types: Cigarettes    Start date: 04/15/1968  . Smokeless tobacco: Never Used  . Alcohol use No     Comment: 09/20/2012 "Used to; stopped ~ 2009; never had problem w/it"  . Drug use: No  . Sexual activity:  No   Other Topics Concern  . Not on file   Social History Narrative   Regular exercise: rides horses   Caffeine use: occasionally    Family History:    Family History  Problem Relation Age of Onset  . Stroke Father        Died at 2  . Diabetes Mother   . Heart attack Brother        Died at 68  . Diabetes Sister   . Stomach cancer Brother   . Heart attack Sister      ROS:  Please see the history of present illness.  ROS All other ROS reviewed and negative.     Physical Exam/Data:   Vitals:   12/22/16 1000 12/22/16 1017 12/22/16 1049 12/22/16 1151  BP: 114/77 120/76 132/77   Pulse: 91 88  90  Resp: 17 18 (!) 21 16  Temp:  98 F (36.7 C) (!) 97.4 F (36.3 C) (!) 96.7 F (35.9 C)  TempSrc:  Oral Oral Axillary  SpO2:  100% 100% 100%  Weight:  123 lb 3.8 oz (55.9 kg)    Height:        Intake/Output Summary (Last 24 hours) at 12/22/16 1423 Last data filed at 12/22/16 1017  Gross per 24 hour  Intake                0 ml  Output             4000 ml  Net            -4000 ml   Filed  Weights   12/22/16 0415 12/22/16 0610 12/22/16 1017  Weight: 175 lb 14.8 oz (79.8 kg) 132 lb 11.5 oz (60.2 kg) 123 lb 3.8 oz (55.9 kg)   Body mass index is 19.3 kg/m.  General:  Well nourished, well developed, in no acute distress HEENT: normal Lymph: no adenopathy Neck: no JVD Endocrine:  No thryomegaly Vascular: No carotid bruits; FA pulses 2+ bilaterally without bruits  Cardiac:  normal S1, S2; RRR; no murmur Lungs: Diminished breath sound at bases Abd: soft, nontender, no hepatomegaly  Ext: no edema Musculoskeletal:  No deformities, BUE and BLE strength normal and equal Skin: warm and dry  Neuro:  CNs 2-12 intact, no focal abnormalities noted Psych:  Normal affect    Relevant CV Studies: Cath 09/2012 Coronary angiography: Coronary dominance: right  Left mainstem: Normal  Left anterior descending (LAD): Focal 50-70% mid LAD stenosis. Small diagonal with diffuse 70% proximal disease.  [place] intermediate is a large branch with focal 40-50% disease in the proximal vessel.  Left circumflex (LCx): minor wall irregularities.  Right coronary artery (RCA): 30% mid vessel.  Left ventriculography: Left ventricular systolic function is normal, LVEF is estimated at 55-65%, there is no significant mitral regurgitation   Final Conclusions:   1. 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 2. Normal LV function.  Recommendations: Medical management especially in light of his history of recurrent GI bleeds. Will hydrate. Repeat CBC in am.  Laboratory Data:  Chemistry Recent Labs Lab 12/22/16 0052  NA 138  K 3.9  CL 101  CO2 26  GLUCOSE 150*  BUN 40*  CREATININE 8.83*  CALCIUM 8.6*  GFRNONAA 5*  GFRAA 6*  ANIONGAP 11     Recent Labs Lab 12/22/16 0052  PROT 7.7  ALBUMIN 3.7  AST 12*  ALT 10*  ALKPHOS 102  BILITOT 0.5   Hematology Recent Labs  Lab 12/22/16 0052  WBC 6.8  RBC 3.79*  HGB 11.6*  HCT 34.4*  MCV 90.8    MCH 30.6  MCHC 33.7  RDW 15.7*  PLT 151   Cardiac Enzymes Recent Labs Lab 12/22/16 0053 12/22/16 0655 12/22/16 1225  TROPONINI 0.11* 0.10* 0.09*   No results for input(s): TROPIPOC in the last 168 hours.   Radiology/Studies:  Dg Chest Port 1 View  Result Date: 12/22/2016 CLINICAL DATA:  Acute respiratory distress tonight. EXAM: PORTABLE CHEST 1 VIEW COMPARISON:  10/21/2016 FINDINGS: Diffuse interstitial fluid or thickening, new. No airspace consolidation. There is a trace fluid in the right minor fissure, but no large effusion. Hilar, mediastinal and cardiac contours are unremarkable and unchanged. IMPRESSION: Diffuse interstitial fluid or interstitial infiltrate. Electronically Signed   By: Andreas Newport M.D.   On: 12/22/2016 00:58    Assessment and Plan:   1. Acute on chronic systolic heart failure - He is euvolemic. Volume managed by dialysis. No orthopnea or PND. - echocardiogram showed diffuse EF to 20-25% with diffuse hypokinesis and grade 1 diastolic dysfunction. Patient denies any angina. ICM vs hypertensive induced. Will review with MD.  2. Elevated troponin with hx of CAD - Prior cath 09/2012 showed 70% LAD. Managed medically due to multiple CVA and GI bleed.  - Mild elevated troponin. Trending down. EKG showed resolved TWI in inferior leads, persistent TWI in lateral leads. Continue Plavix, BB and statin.   3. Hypertension - Elevated at presentation. Says compliant with medication. Now stable. Continue current medication.   4. HLD - Continue statin. 06/14/2016: Cholesterol 131; HDL 31; LDL Cholesterol 69; Triglycerides 156; VLDL 31   5. Acute respiratory failure with pulmonary edema - Symptoms resolved  For questions or updates, please contact [place] Please consult [email or web address] for contact info under Cardiology/STEMI.   Jarrett Soho, PA  12/22/2016 2:23 PM

## 2016-12-22 NOTE — Progress Notes (Signed)
PROGRESS NOTE   [name]  EQA:[id number]    DOB: 1945-09-05    DOA: 12/22/2016  PCP: Elise Benne   I have briefly reviewed patients previous [facility] in [facility].  Brief Narrative:  71 year old male with PMH of ESRD on MWF HD, HTN, HLD, DM, stroke, CAD, chronic combined systolic and diastolic CHF, recurrent GI bleeding from AVM's, reportedly compliant with HD and medications, presented with worsening dyspnea and chest comfort. Admitted for acute hypoxic respiratory failure secondary to flash pulmonary edema in the context of hypertensive urgency. Briefly required BiPAP in ED. Status post HD on 4 L volume removed. Hypoxia resolved. 2-D echo shows worsening LVEF. Cardiology consulted and plan repeat LHC on 10/9.   Assessment & Plan:   Principal Problem:   Flash pulmonary edema ([name]) Active Problems:   Essential hypertension   CAD (coronary artery disease)   Cerebral infarction ([name])   GERD (gastroesophageal reflux disease)   Diabetes mellitus type 2, controlled, with complications ([place])   ESRD (end stage renal disease) ([place])   Anemia due to chronic kidney disease   Acute respiratory failure with hypoxia ([name])   Acute on chronic combined systolic and diastolic CHF (congestive heart failure) ([name])   Elevated troponin I level   HLD (hyperlipidemia)   1. Acute on chronic systolic CHF/flash pulmonary edema: Repeat echo this admission shows LVEF 20-25 percent with diffuse hypokinesis and grade 1 diastolic dysfunction. Volume managed across HD today and had 4 L off. Euvolemic clinically. May have been precipitated by hypertensive urgency complicating worsening EF. 2. Cardiomyopathy: Echo results as above. Worse than in April 2018. Ischemic versus hypertensive. Cardiology consulted and plan LHC on 10/9 to further assess. Lisinopril started. 3. Acute respiratory failure with hypoxia: Secondary to problem #1. Resolved. 4. Hypertensive urgency/essential hypertension:  Improved after meds and dialysis. Continue carvedilol. Cardiology discontinued amlodipine and started lisinopril. 5. CAD/elevated troponin: Prior cath 09/2012 showed 70% LAD managed medically due to multiple CVAs and GI bleed. Continue Plavix, carvedilol and statins. [place] 10/9. 6. History of CVA: No focal deficits. Continue Plavix and Crestor. 7. GERD: Protonix. 8. Hyperlipidemia: Continue Crestor. 9. Diet-controlled DM 2: A1c 4.9 on 06/14/16. 10. ESRD on MWF HD: Seen at HD this morning. Discussed with Dr. Hassell Done. 11. Anemia due to chronic kidney disease: Stable.   DVT prophylaxis: Heparin Code Status: Full Family Communication: None at bedside Disposition: DC home when medically improved   Consultants:  Cardiology Nephrology   Procedures:  HD  Antimicrobials:  None    Subjective: Seen this morning at dialysis. Denies any further dyspnea. Reported never having chest pain during this admission. No cough, palpitations, dizziness or lightheadedness.   ROS: No hematemesis or melena.  Objective:  Vitals:   12/22/16 1017 12/22/16 1049 12/22/16 1151 12/22/16 1517  BP: 120/76 132/77 124/78 113/81  Pulse: 88  90 89  Resp: 18 (!) 21 16 (!) 21  Temp: 98 F (36.7 C) (!) 97.4 F (36.3 C) (!) 96.7 F (35.9 C) 98 F (36.7 C)  TempSrc: Oral Oral Axillary Oral  SpO2: 100% 100% 100%   Weight: 55.9 kg (123 lb 3.8 oz)     Height:        Examination:  General exam: Pleasant elderly male, moderately built and nourished, lying comfortably propped up in bed. Respiratory system: Clear to auscultation. Respiratory effort normal. Cardiovascular system: S1 & S2 heard, RRR. No JVD, murmurs, rubs, gallops or clicks. No pedal edema. Telemetry: Sinus rhythm. Gastrointestinal system: Abdomen is  nondistended, soft and nontender. No organomegaly or masses felt. Normal bowel sounds heard. Central nervous system: Alert and oriented. No focal neurological deficits. Extremities: Symmetric 5 x 5 power.  Left upper arm AV fistula. Skin: No rashes, lesions or ulcers Psychiatry: Judgement and insight appear normal. Mood & affect appropriate.     Data Reviewed: I have personally reviewed following labs and imaging studies  CBC:  Recent Labs Lab 12/22/16 0052  WBC 6.8  NEUTROABS 4.5  HGB 11.6*  HCT 34.4*  MCV 90.8  PLT 629   Basic Metabolic Panel:  Recent Labs Lab 12/22/16 0052  NA 138  K 3.9  CL 101  CO2 26  GLUCOSE 150*  BUN 40*  CREATININE 8.83*  CALCIUM 8.6*   Liver Function Tests:  Recent Labs Lab 12/22/16 0052  AST 12*  ALT 10*  ALKPHOS 102  BILITOT 0.5  PROT 7.7  ALBUMIN 3.7   Coagulation Profile: No results for input(s): INR, PROTIME in the last 168 hours. Cardiac Enzymes:  Recent Labs Lab 12/22/16 0053 12/22/16 0655 12/22/16 1225  TROPONINI 0.11* 0.10* 0.09*   HbA1C: No results for input(s): HGBA1C in the last 72 hours. CBG:  Recent Labs Lab 12/22/16 0430  GLUCAP 130*    Recent Results (from the past 240 hour(s))  MRSA PCR Screening     Status: None   Collection Time: 12/22/16  4:51 AM  Result Value Ref Range Status   MRSA by PCR NEGATIVE NEGATIVE Final    Comment:        The GeneXpert MRSA Assay (FDA approved for NASAL specimens only), is one component of a comprehensive MRSA colonization surveillance program. It is not intended to diagnose MRSA infection nor to guide or monitor treatment for MRSA infections.          Radiology Studies: Dg Chest Port 1 View  Result Date: 12/22/2016 CLINICAL DATA:  Acute respiratory distress tonight. EXAM: PORTABLE CHEST 1 VIEW COMPARISON:  10/21/2016 FINDINGS: Diffuse interstitial fluid or thickening, new. No airspace consolidation. There is a trace fluid in the right minor fissure, but no large effusion. Hilar, mediastinal and cardiac contours are unremarkable and unchanged. IMPRESSION: Diffuse interstitial fluid or interstitial infiltrate. Electronically Signed   By: Andreas Newport  M.D.   On: 12/22/2016 00:58        Scheduled Meds: . [START ON 12/23/2016] aspirin  81 mg Oral Pre-Cath  . calcium acetate  667 mg Oral Q breakfast  . carvedilol  3.125 mg Oral BID WC  . clopidogrel  75 mg Oral Daily  . [START ON 12/23/2016] famotidine  20 mg Oral Daily  . gabapentin  300 mg Oral QHS  . heparin  5,000 Units Subcutaneous Q8H  . [START ON 12/23/2016] lisinopril  10 mg Oral Daily  . loratadine  10 mg Oral Daily  . multivitamin  1 tablet Oral Daily  . nicotine  21 mg Transdermal Daily  . rosuvastatin  40 mg Oral Daily  . sodium chloride flush  3 mL Intravenous Q12H  . venlafaxine XR  37.5 mg Oral Q breakfast   Continuous Infusions: . sodium chloride    . [START ON 12/23/2016] sodium chloride       LOS: 0 days     Monna Crean, MD, FACP, FHM. [facility] Pager [phone]  If 7PM-7AM, please contact night-coverage [email or web address] Password [email or web address] 12/22/2016, 5:07 PM

## 2016-12-22 NOTE — H&P (Addendum)
History and Physical    [name] TDD:[id number] DOB: [birth year] DOA: 12/22/2016  Referring MD/NP/PA:   PCP: Mackie Pai, PA-C   Patient coming from:  The patient is coming from home.  At baseline, pt is independent for most of ADL.   Chief Complaint: Shortness of breath and chest pain   HPI: [name] is a 71 y.o. 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  Pt states that he suddenly started having SOB last night, which has been progressively getting worse. Pt states he feels he can not get air in his lungs. He coughs up white frothy sputum. He also had chest pain, which is mild, located in the frontal chest, constant, pressure-like, nonradiating. Per report from [facility], pt's face was pale with short shallow respiration, and has rales on lung auscultation in ED. Pt had elevated blood pressure with Bp of 166/111. He was given one IV labetalol 20 mg, blood pressure improved to 133/84. BiPAP was started. Pt feels better on BiPAP and oxygen saturation improved from 82 to 100%. When pt arrives to floor in [facility], his respiratory distress has resolved. When I saw patient on the floor, he states that he is shortness breath has resolved. His chest pain has also resolved. Currently patient does not have chest pain, shortness breath, cough. He states that he is constipated, but no nausea, vomiting, diarrhea or abdominal pain. No symptoms of UTI or unilateral weakness. Pt states that he did not miss his dialysis, last dialysis was on Friday. He has been compliant to his blood pressure medications.  ED Course: pt was found to have WBC 6.8, troponin 0.11, potassium 3.9,  bicarbonate 26, creatinine 8.83, temperature normal, tachycardia, tachypnea, chest x-ray showed interstitial edema. Patient is admitted to stepdown as inpatient. Renal, Dr. Jonnie Finner was consulted for hemodialysis.  Review of Systems:   General: no fevers, chills, had fatigue HEENT: no blurry vision, hearing changes or sore throat Respiratory: had dyspnea, coughing, no wheezing CV: had chest pain, no palpitations GI: no nausea, vomiting, abdominal pain, diarrhea, has constipation GU: no dysuria, burning on urination, increased urinary frequency, hematuria  Ext: no leg edema Neuro: no unilateral weakness, numbness, or tingling, no vision change or hearing loss Skin: no rash, no skin tear. MSK: No muscle spasm, no deformity, no limitation of range of movement in spin Heme: No easy bruising.  Travel history: No recent long distant travel.  Allergy: No Known Allergies  Past Medical History:  Diagnosis Date  . 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 Anemia, iron deficiency 03/24/2011   a. Recurrent GI bleed, tx with periodic iron infusions.  . 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 AVM (arteriovenous malformation) of duodenum, acquired    egds in 01/2012, 04/2011  . BPH (benign prostatic hyperplasia)   . CAD (coronary artery disease)    a. Cath 09/2012: moderate borderline CAD in mid LAD/small diagonal branch, mild RCA stenosis, to be managed medically   . Chronic lower back pain   . Diabetic peripheral neuropathy ([place]) 2014   foot pain.  . Essential hypertension 02/01/2012  . Fatty liver    on CT of 11/2010  . GERD (gastroesophageal reflux disease)   . GI bleed  a. Recurrent GI bleed, tx with IV iron. b. Per heme notes - likely AVMs 04/2012 (tx with cauterization several months ago).  . Hematuria    a. Urology note scan from 07/2012: cystoscopy without evidence for bladder lesion, only lateral hypertrophy of posterior urethra, bladder impression from BPH. b. Pt states he had "some  tests" scheduled for later in July 2014.  . High cholesterol   . Hyperlipidemia 02/01/2012  . Hypertension   . Hypertensive urgency 07/06/2016  . 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 Orthostatic hypotension    a. Tx with florinef.  . Peripheral neuropathy   . Polyp, colonic    Colonoscopy 01/2012 "benign" polyp  . Prostate cancer ([place])   . Sleep apnea    "had mask; couldn't sleep in it" (09/20/2012)  . Smoker   . SOB (shortness of breath) 07/06/2016  . Stage III chronic kidney disease ([name])    a. Stage 3 (DM with complications ->CKD, peripheral neuropathy).  . Stroke ([place])    x 2  . Symptomatic anemia 04/03/2015  . Tinea pedis 05/24/2012  . Tobacco use 02/01/2012  . Type 2 diabetes, uncontrolled, with neuropathy ([place]) 02/01/2012  . Type II diabetes mellitus ([place])    a. Dx 1994, uncontrolled.     Past Surgical History:  Procedure Laterality Date  . ENTEROSCOPY N/A 03/01/2013   Procedure: ENTEROSCOPY;  Surgeon: Inda Castle, MD;  Location: [place];  Service: Endoscopy;  Laterality: N/A;  . ENTEROSCOPY N/A 06/26/2016   Procedure: ENTEROSCOPY;  Surgeon: Milus Banister, MD;  Location: [place];  Service: Endoscopy;  Laterality: N/A;  this is a push enteroscopy  . ENTEROSCOPY N/A 10/23/2016   Procedure: ENTEROSCOPY;  Surgeon: Gatha Mayer, MD;  Location: [facility] ENDOSCOPY;  Service: Endoscopy;  Laterality: N/A;  . GIVENS CAPSULE STUDY N/A 04/05/2015   Procedure: GIVENS CAPSULE STUDY;  Surgeon: Gatha Mayer, MD;  Location: [place];  Service: Endoscopy;  Laterality: N/A;  . INGUINAL HERNIA REPAIR Right 2011  . LEFT HEART CATHETERIZATION WITH CORONARY ANGIOGRAM N/A 09/21/2012   Procedure: LEFT HEART CATHETERIZATION WITH CORONARY ANGIOGRAM;  Surgeon: Peter M Martinique, MD;  Location: [facility] CATH LAB;  Service: Cardiovascular;  Laterality: N/A;  . LUMBAR DISC SURGERY  1980's  . LYMPHADENECTOMY Bilateral 01/19/2013   Procedure: LYMPHADENECTOMY;  Surgeon: Bernestine Amass,  MD;  Location: [facility];  Service: Urology;  Laterality: Bilateral;  . ROBOT ASSISTED LAPAROSCOPIC RADICAL PROSTATECTOMY N/A 01/19/2013   Procedure: ROBOTIC ASSISTED LAPAROSCOPIC RADICAL PROSTATECTOMY;  Surgeon: Bernestine Amass, MD;  Location: [facility];  Service: Urology;  Laterality: N/A;  . SHOULDER OPEN ROTATOR CUFF REPAIR Left 1980's    Social History:  reports that he has quit smoking. His smoking use included Cigarettes. He started smoking about 48 years ago. He has a 30.00 pack-year smoking history. He has never used smokeless tobacco. He reports that he does not drink alcohol or use drugs.  Family History:  Family History  Problem Relation Age of Onset  . Stroke Father        Died at 52  . Diabetes Mother   . Heart attack Brother        Died at 76  . Diabetes Sister   . Stomach cancer Brother   . Heart attack Sister      Prior to Admission medications   Medication Sig Start Date End Date Taking? Authorizing Provider  amLODipine (NORVASC) 5 MG tablet Take 1 tablet (5 mg total) by mouth daily. 10/24/16  Dhungel, Nishant, MD  Calcium Acetate 668 (169 Ca) MG TABS Take 1 tablet by mouth daily.    [provider]  carvedilol (COREG) 3.125 MG tablet TAKE 1 TABLET BY MOUTH TWICE DAILY WITH MEALS 12/16/16   Saguier, Percell Miller, PA-C  cetirizine (ZYRTEC ALLERGY) 10 MG tablet Take 1 tablet (10 mg total) by mouth daily. 09/18/16   Emeline General, PA-C  clopidogrel (PLAVIX) 75 MG tablet Take 1 tablet (75 mg total) by mouth daily. 10/31/16   Dhungel, Flonnie Overman, MD  gabapentin (NEURONTIN) 300 MG capsule Take 1 capsule (300 mg total) by mouth at bedtime. Patient taking differently: Take 300 mg by mouth 3 (three) times daily.  07/07/16   Domenic Polite, MD  multivitamin (RENA-VIT) TABS tablet Take 1 tablet by mouth daily.    [provider]  nicotine (NICODERM CQ - DOSED IN MG/24 HOURS) 21 mg/24hr patch Place 1 patch (21 mg total) onto the skin daily. 10/25/16   Dhungel, Flonnie Overman, MD    ranitidine (ZANTAC) 150 MG capsule Take 1 capsule (150 mg total) by mouth 2 (two) times daily. 11/11/16   Saguier, Percell Miller, PA-C  rosuvastatin (CRESTOR) 40 MG tablet TAKE 1 TABLET BY MOUTH EVERY DAY 10/28/16   Saguier, Percell Miller, PA-C  venlafaxine XR (EFFEXOR-XR) 37.5 MG 24 hr capsule Take 1 capsule (37.5 mg total) by mouth daily with breakfast. 11/11/16   Mackie Pai, PA-C    Physical Exam: Vitals:   12/22/16 0315 12/22/16 0330 12/22/16 0338 12/22/16 0415  BP: (!) 146/94 (!) 141/88  140/87  Pulse: 87 85 86 91  Resp: 13 14 15 17   Temp:    98.3 F (36.8 C)  TempSrc:    Oral  SpO2: 100% 100% 100% 94%  Weight:    79.8 kg (175 lb 14.8 oz)  Height:    5\' 7"  (1.702 m)   General: Not in acute distress HEENT:       Eyes: PERRL, EOMI, no scleral icterus.       ENT: No discharge from the ears and nose, no pharynx injection, no tonsillar enlargement.        Neck: No JVD, no bruit, no mass felt. Heme: No neck lymph node enlargement. Cardiac: S1/S2, RRR, No murmurs, No gallops or rubs. Respiratory: has rales bilaterally, wheezing, rhonchi or rubs. GI: Soft, nondistended, nontender, no rebound pain, no organomegaly, BS present. GU: No hematuria Ext: No pitting leg edema bilaterally. 2+DP/PT pulse bilaterally. Musculoskeletal: No joint deformities, No joint redness or warmth, no limitation of ROM in spin. Skin: No rashes.  Neuro: Alert, oriented X3, cranial nerves II-XII grossly intact, moves all extremities normally. Psych: Patient is not psychotic, no suicidal or hemocidal ideation.  Labs on Admission: I have personally reviewed following labs and imaging studies  CBC:  Recent Labs Lab 12/22/16 0052  WBC 6.8  NEUTROABS 4.5  HGB 11.6*  HCT 34.4*  MCV 90.8  PLT 767   Basic Metabolic Panel:  Recent Labs Lab 12/22/16 0052  NA 138  K 3.9  CL 101  CO2 26  GLUCOSE 150*  BUN 40*  CREATININE 8.83*  CALCIUM 8.6*   GFR: Estimated Creatinine Clearance: 7.9 mL/min (A) (by C-G  formula based on SCr of 8.83 mg/dL (H)). Liver Function Tests:  Recent Labs Lab 12/22/16 0052  AST 12*  ALT 10*  ALKPHOS 102  BILITOT 0.5  PROT 7.7  ALBUMIN 3.7    Recent Labs Lab 12/22/16 0052  LIPASE 66*   No results for input(s): AMMONIA in the  last 168 hours. Coagulation Profile: No results for input(s): INR, PROTIME in the last 168 hours. Cardiac Enzymes:  Recent Labs Lab 12/22/16 0053  TROPONINI 0.11*   BNP (last 3 results)  Recent Labs  07/09/16 1438 07/31/16 1104 08/19/16 1155  PROBNP 1,044.0* 343.0* 349.0*   HbA1C: No results for input(s): HGBA1C in the last 72 hours. CBG:  Recent Labs Lab 12/22/16 0430  GLUCAP 130*   Lipid Profile: No results for input(s): CHOL, HDL, LDLCALC, TRIG, CHOLHDL, LDLDIRECT in the last 72 hours. Thyroid Function Tests: No results for input(s): TSH, T4TOTAL, FREET4, T3FREE, THYROIDAB in the last 72 hours. Anemia Panel: No results for input(s): VITAMINB12, FOLATE, FERRITIN, TIBC, IRON, RETICCTPCT in the last 72 hours. Urine analysis:    Component Value Date/Time   COLORURINE YELLOW 04/01/2016 0939   APPEARANCEUR CLEAR 04/01/2016 0939   LABSPEC >=1.030 (A) 04/01/2016 0939   PHURINE 5.5 04/01/2016 0939   GLUCOSEU NEGATIVE 04/01/2016 0939   HGBUR MODERATE (A) 04/01/2016 0939   BILIRUBINUR SMALL (A) 04/01/2016 0939   BILIRUBINUR small 06/21/2012 1041   KETONESUR TRACE (A) 04/01/2016 0939   PROTEINUR >300 (A) 12/25/2015 1028   UROBILINOGEN 0.2 04/01/2016 0939   NITRITE NEGATIVE 04/01/2016 0939   LEUKOCYTESUR NEGATIVE 04/01/2016 0939   Sepsis Labs: @LABRCNTIP (procalcitonin:4,lacticidven:4) )No results found for this or any previous visit (from the past 240 hour(s)).   Radiological Exams on Admission: Dg Chest Port 1 View  Result Date: 12/22/2016 CLINICAL DATA:  Acute respiratory distress tonight. EXAM: PORTABLE CHEST 1 VIEW COMPARISON:  10/21/2016 FINDINGS: Diffuse interstitial fluid or thickening, new. No  airspace consolidation. There is a trace fluid in the right minor fissure, but no large effusion. Hilar, mediastinal and cardiac contours are unremarkable and unchanged. IMPRESSION: Diffuse interstitial fluid or interstitial infiltrate. Electronically Signed   By: Andreas Newport M.D.   On: 12/22/2016 00:58     EKG: Independently reviewed.  Sinus rhythm, tachycardia, QTC 471, LAD, anteroseptal infarction pattern.  Assessment/Plan Principal Problem:   Flash pulmonary edema ([name]) Active Problems:   Essential hypertension   CAD (coronary artery disease)   Cerebral infarction ([name])   GERD (gastroesophageal reflux disease)   Diabetes mellitus type 2, controlled, with complications ([place])   ESRD (end stage renal disease) ([place])   Anemia due to chronic kidney disease   Acute respiratory failure with hypoxia ([name])   Acute on chronic combined systolic and diastolic CHF (congestive heart failure) ([name])   Elevated troponin I level   HLD (hyperlipidemia)   Acute respiratory failure with hypoxia due to Flash pulmonary edema: likely triggerd by elevated blood pressure. Pt had elevated blood pressure with Bp of 166/111. He was given one IV labetalol 20 mg, blood pressure improved to 133/84. Now pt is off biPAP and is asymptomatic. Renal, Dr. Jonnie Finner was consulted for hemodialysis.  -pt is admitted to SDU as inpt -dialysis per renal -blood pressure control  Acute on chronic combined systolic and diastolic CHF: 2-D echo on 06/15/16 showed EF of 45-50 percent with grade 1 diastolic dysfunction. Now has  pulmonary edema. -volume management per renal via HD. -continue coreg  Essential hypertension and hypertensive urgency: Blood pressure is controlled and treated with IV labetalol. -Continue home Coreg, amlodipine -IV hydralazine when necessary  CAD and elevated trop: pt's trop is chronically elevated, baseline is 0.03-0.06. Today his troponin is 0.11. Patient had transient chest pain, which has  resolved along with controlled blood pressure. Most likely due to demand ischemia secondary to elevated blood pressure and pulmonary  edema. Currently patient does not have chest pain. -continue Crestor, Coreg -Troponin 3 -2d echo -will not give ASA due to hx of GIB -When necessary nitroglycerin and morphine for pain  Hx of Cerebral infarction ([place]): -Continue Plavix, Crestor  GERD: -Protonix  HLD: -crestor  Diet controlled diabetes complication of peripheral neuropathy and ESRD: A1c was 4.9 on 06/14/16, well controlled. Patient is not taking medications. Currently blood sugar is 130 -Check CBG every morning  ESRD (end stage renal disease) (MWF): potassium 3.9, bicarbonate 26, creatinine 8.83 -dialysis today per renal  Anemia due to chronic kidney disease: Hemoglobin stable 11.8 today -Follow-up by CBC  DVT ppx: SQ Heparin    Code Status: Full code Family Communication: None at bed side.   Disposition Plan:  Anticipate discharge back to previous home environment Consults called:  Renal, Dr. Jonnie Finner Admission status:    SDU/inpation       Date of Service 12/22/2016    Ivor Costa [facility] Pager [phone]  If 7PM-7AM, please contact night-coverage [email or web address] Password [email or web address] 12/22/2016, 5:43 AM

## 2016-12-22 NOTE — Progress Notes (Signed)
This is a no charge note  Transfer from [facility] per Dr. 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 Consistence with flush of pulmonary edema. Pt feels better on BiPAP and oxygen saturation improved from 82 to 100% with BiPAP.   Patient was found to have WBC 6.8, troponin 0.11, potassium 3.9, bicarbonate 26, creatinine 8.83, temperature normal, tachycardia, tachypnea, chest x-ray showed interstitial edema. Patient is admitted to stepdown as inpatient. EPD will call renal for hemodialysis.   Please call manager of [facility] at [phone] when pt arrives to floor  Ivor Costa, MD  [facility] Pager [phone]  If 7PM-7AM, please contact night-coverage [email or web address] Password [email or web address] 12/22/2016, 1:57 AM

## 2016-12-22 NOTE — ED Triage Notes (Addendum)
Pt presents with c/o shortness of breath for about an hour pt unable to get out of car coughing up white frothy sputum. Pt reports upper abdominal/lower chest pain that started about the same time as shortness of breath. Pt states he feels he can not get air in his lungs. Face pale and color. Pt having short shallow resps, rales noted bilateral. Dr Florina Ou at bedside on arrival to room 1.

## 2016-12-22 NOTE — Progress Notes (Signed)
  Echocardiogram 2D Echocardiogram has been performed.  [name] 12/22/2016, 11:42 AM

## 2016-12-22 NOTE — ED Notes (Signed)
EDP aware of troponin result.

## 2016-12-23 ENCOUNTER — Telehealth: Payer: Self-pay | Admitting: *Deleted

## 2016-12-23 ENCOUNTER — Inpatient Hospital Stay (HOSPITAL_COMMUNITY)
Admission: EM | Disposition: A | Discharge status: Home / Self Care | Payer: Self-pay | Source: Home / Self Care | Attending: Internal Medicine

## 2016-12-23 ENCOUNTER — Encounter (HOSPITAL_COMMUNITY): Payer: Self-pay | Admitting: Cardiology

## 2016-12-23 DIAGNOSIS — I2510 Atherosclerotic heart disease of native coronary artery without angina pectoris: Secondary | ICD-10-CM

## 2016-12-23 HISTORY — PX: LEFT HEART CATH AND CORONARY ANGIOGRAPHY: CATH118249

## 2016-12-23 LAB — POCT ACTIVATED CLOTTING TIME
ACTIVATED CLOTTING TIME: 180 s
ACTIVATED CLOTTING TIME: 191 s
Activated Clotting Time: 252 seconds
Activated Clotting Time: 219 seconds

## 2016-12-23 LAB — BASIC METABOLIC PANEL
ANION GAP: 13 (ref 5–15)
BUN: 28 mg/dL — ABNORMAL HIGH (ref 6–20)
CHLORIDE: 93 mmol/L — AB (ref 101–111)
CO2: 27 mmol/L (ref 22–32)
Calcium: 8.5 mg/dL — ABNORMAL LOW (ref 8.9–10.3)
Creatinine, Ser: 7.2 mg/dL — ABNORMAL HIGH (ref 0.61–1.24)
GFR, EST AFRICAN AMERICAN: 8 mL/min — AB (ref >60)
GFR, EST NON AFRICAN AMERICAN: 7 mL/min — AB (ref >60)
Glucose, Bld: 122 mg/dL — ABNORMAL HIGH (ref 65–99)
POTASSIUM: 4 mmol/L (ref 3.5–5.1)
SODIUM: 133 mmol/L — AB (ref 135–145)

## 2016-12-23 LAB — HEPATITIS B SURFACE ANTIGEN: HEP B S AG: NEGATIVE

## 2016-12-23 LAB — CBC
HEMATOCRIT: 34.6 % — AB (ref 39.0–52.0)
Hemoglobin: 11.8 g/dL — ABNORMAL LOW (ref 13.0–17.0)
MCH: 30 pg (ref 26.0–34.0)
MCHC: 34.1 g/dL (ref 30.0–36.0)
MCV: 88 fL (ref 78.0–100.0)
PLATELETS: 150 10*3/uL (ref 150–400)
RBC: 3.93 MIL/uL — AB (ref 4.22–5.81)
RDW: 16.1 % — ABNORMAL HIGH (ref 11.5–15.5)
WBC: 3.1 10*3/uL — AB (ref 4.0–10.5)

## 2016-12-23 LAB — PROTIME-INR
INR: 0.99
Prothrombin Time: 13 seconds (ref 11.4–15.2)

## 2016-12-23 LAB — GLUCOSE, CAPILLARY: GLUCOSE-CAPILLARY: 125 mg/dL — AB (ref 65–99)

## 2016-12-23 SURGERY — LEFT HEART CATH AND CORONARY ANGIOGRAPHY
Anesthesia: LOCAL

## 2016-12-23 MED ORDER — HEPARIN (PORCINE) IN NACL 2-0.9 UNIT/ML-% IJ SOLN
INTRAMUSCULAR | Status: AC
  Filled 2016-12-23: qty 1000

## 2016-12-23 MED ORDER — ADENOSINE 12 MG/4ML IV SOLN
INTRAVENOUS | Status: AC
  Filled 2016-12-23: qty 4

## 2016-12-23 MED ORDER — ADENOSINE (DIAGNOSTIC) 140MCG/KG/MIN
INTRAVENOUS | Status: DC | PRN
  Administered 2016-12-23: 140 ug/kg/min via INTRAVENOUS

## 2016-12-23 MED ORDER — HEPARIN SODIUM (PORCINE) 1000 UNIT/ML IJ SOLN
INTRAMUSCULAR | Status: AC
  Filled 2016-12-23: qty 1

## 2016-12-23 MED ORDER — PNEUMOCOCCAL VAC POLYVALENT 25 MCG/0.5ML IJ INJ
0.5000 mL | INJECTION | INTRAMUSCULAR | Status: AC
  Administered 2016-12-25: 0.5 mL via INTRAMUSCULAR
  Filled 2016-12-23: qty 0.5

## 2016-12-23 MED ORDER — LIDOCAINE HCL (PF) 1 % IJ SOLN
INTRAMUSCULAR | Status: DC | PRN
  Administered 2016-12-23: 20 mL

## 2016-12-23 MED ORDER — FENTANYL CITRATE (PF) 100 MCG/2ML IJ SOLN
INTRAMUSCULAR | Status: DC | PRN
  Administered 2016-12-23: 25 ug via INTRAVENOUS

## 2016-12-23 MED ORDER — SODIUM CHLORIDE 0.9% FLUSH: 3.0000 mL | INTRAVENOUS | Status: DC | PRN

## 2016-12-23 MED ORDER — FENTANYL CITRATE (PF) 100 MCG/2ML IJ SOLN
INTRAMUSCULAR | Status: AC
  Filled 2016-12-23: qty 2

## 2016-12-23 MED ORDER — IOPAMIDOL (ISOVUE-370) INJECTION 76%
INTRAVENOUS | Status: AC
  Filled 2016-12-23: qty 100

## 2016-12-23 MED ORDER — MIDAZOLAM HCL 2 MG/2ML IJ SOLN
INTRAMUSCULAR | Status: DC | PRN
  Administered 2016-12-23: 1 mg via INTRAVENOUS

## 2016-12-23 MED ORDER — LISINOPRIL 5 MG PO TABS
5.0000 mg | ORAL_TABLET | Freq: Every day | ORAL | Status: DC
  Administered 2016-12-23 – 2016-12-24 (×2): 5 mg via ORAL
  Filled 2016-12-23 (×2): qty 1

## 2016-12-23 MED ORDER — HEPARIN SODIUM (PORCINE) 1000 UNIT/ML IJ SOLN
INTRAMUSCULAR | Status: DC | PRN
  Administered 2016-12-23: 6000 [IU] via INTRAVENOUS

## 2016-12-23 MED ORDER — MIDAZOLAM HCL 2 MG/2ML IJ SOLN
INTRAMUSCULAR | Status: AC
  Filled 2016-12-23: qty 2

## 2016-12-23 MED ORDER — ASPIRIN 81 MG PO CHEW
CHEWABLE_TABLET | ORAL | Status: AC
  Filled 2016-12-23: qty 1

## 2016-12-23 MED ORDER — SODIUM CHLORIDE 0.9% FLUSH
3.0000 mL | Freq: Two times a day (BID) | INTRAVENOUS | Status: DC
  Administered 2016-12-23 – 2016-12-24 (×4): 3 mL via INTRAVENOUS

## 2016-12-23 MED ORDER — LIDOCAINE HCL 2 % IJ SOLN
INTRAMUSCULAR | Status: AC
  Filled 2016-12-23: qty 10

## 2016-12-23 MED ORDER — SODIUM CHLORIDE 0.9 % IV SOLN: 250.0000 mL | INTRAVENOUS | Status: DC | PRN

## 2016-12-23 MED ORDER — IOPAMIDOL (ISOVUE-370) INJECTION 76%
INTRAVENOUS | Status: DC | PRN
  Administered 2016-12-23: 105 mL via INTRA_ARTERIAL

## 2016-12-23 MED ORDER — HEPARIN (PORCINE) IN NACL 2-0.9 UNIT/ML-% IJ SOLN
INTRAMUSCULAR | Status: AC | PRN
  Administered 2016-12-23: 1000 mL

## 2016-12-23 SURGICAL SUPPLY — 13 items
CATH INFINITI 5FR MULTPACK ANG (CATHETERS) ×2 IMPLANT
CATH LAUNCHER 5F EBU3.5 (CATHETERS) ×2 IMPLANT
CATH MICROCATH NAVVUS (MICROCATHETER) ×1 IMPLANT
CATH VISTA GUIDE 6FR XBLAD3.5 (CATHETERS) IMPLANT
KIT HEART LEFT (KITS) ×2 IMPLANT
MICROCATHETER NAVVUS (MICROCATHETER) ×2
PACK CARDIAC CATHETERIZATION (CUSTOM PROCEDURE TRAY) ×2 IMPLANT
SHEATH PINNACLE 5F 10CM (SHEATH) ×2 IMPLANT
SYR MEDRAD MARK V 150ML (SYRINGE) ×2 IMPLANT
TRANSDUCER W/STOPCOCK (MISCELLANEOUS) ×2 IMPLANT
TUBING CIL FLEX 10 FLL-RA (TUBING) ×2 IMPLANT
VALVE GUARDIAN II ~~LOC~~ HEMO (MISCELLANEOUS) ×2 IMPLANT
WIRE ASAHI PROWATER 180CM (WIRE) ×2 IMPLANT

## 2016-12-23 NOTE — Telephone Encounter (Signed)
Received Physician Orders from [facility]; forwarded to provider/SLS 10/09  

## 2016-12-23 NOTE — Progress Notes (Signed)
[facility] ROUNDING NOTE   Subjective:   Interval History: 71 y.o.malewith ESRD, CAD, Type 2 DM, prostate cancer, Hx CVA, and Hx GI bleed (due to AVMs), combined HF who was admitted this am with SOB and pulm edema.  Objective:  Vital signs in last 24 hours:  Temp:  [96.7 F (35.9 C)-98 F (36.7 C)] 97.6 F (36.4 C) (10/09 0442) Pulse Rate:  [88-92] 90 (10/09 0442) Resp:  [12-21] 13 (10/09 0442) BP: (107-140)/(63-84) 121/63 (10/09 0442) SpO2:  [96 %-100 %] 99 % (10/09 0742) Weight:  [123 lb 3.8 oz (55.9 kg)-125 lb 9.6 oz (57 kg)] 125 lb 9.6 oz (57 kg) (10/09 0500)  Weight change: -52 lb 11 oz (-23.9 kg) Filed Weights   12/22/16 0610 12/22/16 1017 12/23/16 0500  Weight: 132 lb 11.5 oz (60.2 kg) 123 lb 3.8 oz (55.9 kg) 125 lb 9.6 oz (57 kg)    Intake/Output: I/O last 3 completed shifts: In: 246.7 [P.O.:240; I.V.:6.7] Out: 4000 [Other:4000]   Intake/Output this shift:  No intake/output data recorded.  CVS- RRR  No JVP RS- CTA ABD- BS present soft non-distended EXT- trace edema   Basic Metabolic Panel:  Recent Labs Lab 12/22/16 0052 12/23/16 0336  NA 138 133*  K 3.9 4.0  CL 101 93*  CO2 26 27  GLUCOSE 150* 122*  BUN 40* 28*  CREATININE 8.83* 7.20*  CALCIUM 8.6* 8.5*    Liver Function Tests:  Recent Labs Lab 12/22/16 0052  AST 12*  ALT 10*  ALKPHOS 102  BILITOT 0.5  PROT 7.7  ALBUMIN 3.7    Recent Labs Lab 12/22/16 0052  LIPASE 66*   No results for input(s): AMMONIA in the last 168 hours.  CBC:  Recent Labs Lab 12/22/16 0052 12/23/16 0336  WBC 6.8 3.1*  NEUTROABS 4.5  --   HGB 11.6* 11.8*  HCT 34.4* 34.6*  MCV 90.8 88.0  PLT 151 150    Cardiac Enzymes:  Recent Labs Lab 12/22/16 0053 12/22/16 0655 12/22/16 1225 12/22/16 1819  TROPONINI 0.11* 0.10* 0.09* 0.07*    BNP: Invalid input(s): POCBNP  CBG:  Recent Labs Lab 12/22/16 0430  GLUCAP 130*    Microbiology: Results for orders placed or performed  during the hospital encounter of 12/22/16  MRSA PCR Screening     Status: None   Collection Time: 12/22/16  4:51 AM  Result Value Ref Range Status   MRSA by PCR NEGATIVE NEGATIVE Final    Comment:        The GeneXpert MRSA Assay (FDA approved for NASAL specimens only), is one component of a comprehensive MRSA colonization surveillance program. It is not intended to diagnose MRSA infection nor to guide or monitor treatment for MRSA infections.     Coagulation Studies:  Recent Labs  12/23/16 0336  LABPROT 13.0  INR 0.99    Urinalysis: No results for input(s): COLORURINE, LABSPEC, PHURINE, GLUCOSEU, HGBUR, BILIRUBINUR, KETONESUR, PROTEINUR, UROBILINOGEN, NITRITE, LEUKOCYTESUR in the last 72 hours.  Invalid input(s): APPERANCEUR    Imaging: Dg Chest Port 1 View  Result Date: 12/22/2016 CLINICAL DATA:  Acute respiratory distress tonight. EXAM: PORTABLE CHEST 1 VIEW COMPARISON:  10/21/2016 FINDINGS: Diffuse interstitial fluid or thickening, new. No airspace consolidation. There is a trace fluid in the right minor fissure, but no large effusion. Hilar, mediastinal and cardiac contours are unremarkable and unchanged. IMPRESSION: Diffuse interstitial fluid or interstitial infiltrate. Electronically Signed   By: Andreas Newport M.D.   On: 12/22/2016 00:58  Medications:   . sodium chloride    . sodium chloride 10 mL/hr at 12/23/16 0520  . adenosine (diagnostic) Stopped (12/23/16 6568)  . heparin     . [MAR Hold] calcium acetate  667 mg Oral Q breakfast  . [MAR Hold] carvedilol  3.125 mg Oral BID WC  . [MAR Hold] clopidogrel  75 mg Oral Daily  . [MAR Hold] famotidine  20 mg Oral Daily  . [MAR Hold] gabapentin  300 mg Oral QHS  . [MAR Hold] heparin  5,000 Units Subcutaneous Q8H  . [MAR Hold] lisinopril  10 mg Oral Daily  . [MAR Hold] loratadine  10 mg Oral Daily  . [MAR Hold] multivitamin  1 tablet Oral Daily  . [MAR Hold] nicotine  21 mg Transdermal Daily  . [MAR  Hold] pneumococcal 23 valent vaccine  0.5 mL Intramuscular Tomorrow-1000  . [MAR Hold] rosuvastatin  40 mg Oral Daily  . sodium chloride flush  3 mL Intravenous Q12H  . [MAR Hold] venlafaxine XR  37.5 mg Oral Q breakfast   sodium chloride, [MAR Hold] acetaminophen, adenosine (diagnostic), fentaNYL, heparin, heparin, [MAR Hold] hydrALAZINE, iopamidol, lidocaine (PF), midazolam, [MAR Hold]  morphine injection, [MAR Hold] nitroGLYCERIN, [MAR Hold] ondansetron (ZOFRAN) IV, sodium chloride flush, [MAR Hold] zolpidem  Assessment/ Plan:  1. Acute pulm edema/ SOB - 2 -D echo shows worsening LVEF. 20-25 percent with diffuse hypokinesis Cardiology consulted and plan LHC on 10/9 to further assess. Lisinopril started. Undergoing Left heart catheterization 2. ESRD on HD MWF , [facility] 3. HTN - cont meds  Will decrease lisinopril to 5 mg  Volume removal with dialysis may be challenging with too low systolic blood pressure  4. Hx CVA 5. Anemia of CKD - Hb 11.6 no ESA     LOS: 1 [name] W @TODAY @8 :47 AM

## 2016-12-23 NOTE — H&P (View-Only) (Signed)
Cardiology Consultation:   Patient ID: [name]; [id number]; [birth year]   Admit date: 12/22/2016 Date of Consult: 12/22/2016  Primary Care Provider: Mackie Pai, PA-C Primary Cardiologist: Dr. Irish Lack   Patient Profile:   [name] is a 71 y.o. male with a hx of CAD (medically managed), multiple CVA and prior GI bleed, end-stage renal disease on hemodialysis, hypertension, hyperlipidemia and diabetes who is being seen today for the evaluation of CHF at the request of  Dr. Algis Liming.  HIs LVEF was 35-40% in 2014.  He had a left heart cath showing a 70% mid LAD lesion (detailed below). This was managed medically due to prior GI bleed. Had endoscopy in 4/18 and was cleared to start back aspirin and plavix.   Last echocardiogram 06/2016 showed LVEF of 45 to 50% With diffuse hypokinesis. EF mildly reduced when compared to prior.  He was doing well on cardiac stand point when last seen by Dr. Irish Lack 08/06/16.  History of Present Illness:   Mr. Armijo had sudden onset shortness of breath last night around 1 PM. He was sitting in chair. Asssociated with mild chest discomfort. EMS was called due to worsening of shortness of breath. EMS found him hypoxic and placed on BiPAP with improved symptoms. His blood pressure was elevated at 166/111 at presentation. Patient was admitted and given IV labetalol. Currently denies chest pain or shortness of breath.  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 No chest pain or shortness of breath. He denies orthopnea, PND, syncope, lower extremity edema, dizziness, melena or blood in his stool or urine. Currently he is trying to quit tobacco smoking.  Troponin 0.11-->0.1-->0.09 EKG:  The EKG was personally reviewed and demonstrates:  Sinus rhythm with LVH and T-wave inversion in lateral lead. Compared to prior EKG 10/21/16 T-wave inversion in inferior lead resolved. Telemetry:  Telemetry was personally reviewed and demonstrates: sinus rhythm with  controlled ventricular rate  Echocardiogram this admission showed further reduced EF of 20-25%with diffuse hypokinesis and grade 1 diastolic dysfunction, moderate LVH, trace MR and TR. Cadiology is asked for further management of CHF. Past Medical History:  Diagnosis Date  . 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 Anemia, iron deficiency 03/24/2011   a. Recurrent GI bleed, tx with periodic iron infusions.  . 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 AVM (arteriovenous malformation) of duodenum, acquired    egds in 01/2012, 04/2011  . BPH (benign prostatic hyperplasia)   . CAD (coronary artery disease)    a. Cath 09/2012: moderate borderline CAD in mid LAD/small diagonal branch, mild RCA stenosis, to be managed medically   . Chronic lower back pain   . Diabetic peripheral neuropathy ([place]) 2014   foot pain.  . Essential hypertension 02/01/2012  . Fatty liver    on CT of 11/2010  . GERD (gastroesophageal reflux disease)   . GI bleed    a. Recurrent GI bleed, tx with IV iron. b. Per heme notes - likely AVMs 04/2012 (tx with cauterization several months ago).  . Hematuria    a. Urology note scan from 07/2012: cystoscopy without evidence for bladder lesion, only lateral hypertrophy of posterior urethra, bladder impression from BPH. b. Pt states he had "some tests" scheduled for later in July 2014.  . High cholesterol   . Hyperlipidemia 02/01/2012  . Hypertension   . Hypertensive urgency 07/06/2016  . 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 Orthostatic hypotension    a. Tx with florinef.  . Peripheral neuropathy   .  Polyp, colonic    Colonoscopy 01/2012 "benign" polyp  . Prostate cancer ([place])   . Sleep apnea    "had mask; couldn't sleep in it" (09/20/2012)  . Smoker   . SOB (shortness of breath) 07/06/2016  . Stage III chronic kidney disease ([name])    a. Stage 3 (DM with complications ->CKD, peripheral neuropathy).  . Stroke ([place])    x 2  . Symptomatic anemia 04/03/2015  .  Tinea pedis 05/24/2012  . Tobacco use 02/01/2012  . Type 2 diabetes, uncontrolled, with neuropathy ([place]) 02/01/2012  . Type II diabetes mellitus ([place])    a. Dx 1994, uncontrolled.     Past Surgical History:  Procedure Laterality Date  . ENTEROSCOPY N/A 03/01/2013   Procedure: ENTEROSCOPY;  Surgeon: Inda Castle, MD;  Location: [facility];  Service: Endoscopy;  Laterality: N/A;  . ENTEROSCOPY N/A 06/26/2016   Procedure: ENTEROSCOPY;  Surgeon: Milus Banister, MD;  Location: [facility];  Service: Endoscopy;  Laterality: N/A;  this is a push enteroscopy  . ENTEROSCOPY N/A 10/23/2016   Procedure: ENTEROSCOPY;  Surgeon: Gatha Mayer, MD;  Location: [facility] ENDOSCOPY;  Service: Endoscopy;  Laterality: N/A;  . GIVENS CAPSULE STUDY N/A 04/05/2015   Procedure: GIVENS CAPSULE STUDY;  Surgeon: Gatha Mayer, MD;  Location: [facility];  Service: Endoscopy;  Laterality: N/A;  . INGUINAL HERNIA REPAIR Right 2011  . LEFT HEART CATHETERIZATION WITH CORONARY ANGIOGRAM N/A 09/21/2012   Procedure: LEFT HEART CATHETERIZATION WITH CORONARY ANGIOGRAM;  Surgeon: Peter M Martinique, MD;  [facility] C[facility];  Service: Cardiovascular;  Laterality: N/A;  . LUMBAR DISC SURGERY  1980's  . LYMPHADENECTOMY Bilateral 01/19/2013   Procedure: LYMPHADENECTOMY;  Surgeon: Bernestine Amass, MD;  Location: [facility];  Service: Urology;  Laterality: Bilateral;  . ROBOT ASSISTED LAPAROSCOPIC RADICAL PROSTATECTOMY N/A 01/19/2013   Procedure: ROBOTIC ASSISTED LAPAROSCOPIC RADICAL PROSTATECTOMY;  Surgeon: Bernestine Amass, MD;  Location: [facility];  Service: Urology;  Laterality: N/A;  . SHOULDER OPEN ROTATOR CUFF REPAIR Left 1980's       Inpatient Medications: Scheduled Meds: . amLODipine  5 mg Oral Daily  . calcium acetate  667 mg Oral Q breakfast  . carvedilol  3.125 mg Oral BID WC  . clopidogrel  75 mg Oral Daily  . [START ON 12/23/2016] famotidine  20 mg Oral Daily  . gabapentin  300 mg Oral QHS  . heparin  5,000 Units  Subcutaneous Q8H  . loratadine  10 mg Oral Daily  . multivitamin  1 tablet Oral Daily  . nicotine  21 mg Transdermal Daily  . rosuvastatin  40 mg Oral Daily  . venlafaxine XR  37.5 mg Oral Q breakfast   Continuous Infusions:  PRN Meds: acetaminophen, hydrALAZINE, morphine injection, nitroGLYCERIN, ondansetron (ZOFRAN) IV, zolpidem  Allergies:   No Known Allergies  Social History:   Social History   Social History  . Marital status: Married    Spouse name: N/A  . Number of children: 2  . Years of education: N/A   Occupational History  . taken out of work due to back    Social History Main Topics  . Smoking status: Former Smoker    Packs/day: 1.00    Years: 30.00    Types: Cigarettes    Start date: 04/15/1968  . Smokeless tobacco: Never Used  . Alcohol use No     Comment: 09/20/2012 "Used to; stopped ~ 2009; never had problem w/it"  . Drug use: No  . Sexual activity:  No   Other Topics Concern  . Not on file   Social History Narrative   Regular exercise: rides horses   Caffeine use: occasionally    Family History:    Family History  Problem Relation Age of Onset  . Stroke Father        Died at 63  . Diabetes Mother   . Heart attack Brother        Died at 19  . Diabetes Sister   . Stomach cancer Brother   . Heart attack Sister      ROS:  Please see the history of present illness.  ROS All other ROS reviewed and negative.     Physical Exam/Data:   Vitals:   12/22/16 1000 12/22/16 1017 12/22/16 1049 12/22/16 1151  BP: 114/77 120/76 132/77   Pulse: 91 88  90  Resp: 17 18 (!) 21 16  Temp:  98 F (36.7 C) (!) 97.4 F (36.3 C) (!) 96.7 F (35.9 C)  TempSrc:  Oral Oral Axillary  SpO2:  100% 100% 100%  Weight:  123 lb 3.8 oz (55.9 kg)    Height:        Intake/Output Summary (Last 24 hours) at 12/22/16 1423 Last data filed at 12/22/16 1017  Gross per 24 hour  Intake                0 ml  Output             4000 ml  Net            -4000 ml   Filed  Weights   12/22/16 0415 12/22/16 0610 12/22/16 1017  Weight: 175 lb 14.8 oz (79.8 kg) 132 lb 11.5 oz (60.2 kg) 123 lb 3.8 oz (55.9 kg)   Body mass index is 19.3 kg/m.  General:  Well nourished, well developed, in no acute distress HEENT: normal Lymph: no adenopathy Neck: no JVD Endocrine:  No thryomegaly Vascular: No carotid bruits; FA pulses 2+ bilaterally without bruits  Cardiac:  normal S1, S2; RRR; no murmur Lungs: Diminished breath sound at bases Abd: soft, nontender, no hepatomegaly  Ext: no edema Musculoskeletal:  No deformities, BUE and BLE strength normal and equal Skin: warm and dry  Neuro:  CNs 2-12 intact, no focal abnormalities noted Psych:  Normal affect    Relevant CV Studies: Cath 09/2012 Coronary angiography: Coronary dominance: right  Left mainstem: Normal  Left anterior descending (LAD): Focal 50-70% mid LAD stenosis. Small diagonal with diffuse 70% proximal disease.  [place] intermediate is a large branch with focal 40-50% disease in the proximal vessel.  Left circumflex (LCx): minor wall irregularities.  Right coronary artery (RCA): 30% mid vessel.  Left ventriculography: Left ventricular systolic function is normal, LVEF is estimated at 55-65%, there is no significant mitral regurgitation   Final Conclusions:   1. 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 2. Normal LV function.  Recommendations: Medical management especially in light of his history of recurrent GI bleeds. Will hydrate. Repeat CBC in am.  Laboratory Data:  Chemistry Recent Labs Lab 12/22/16 0052  NA 138  K 3.9  CL 101  CO2 26  GLUCOSE 150*  BUN 40*  CREATININE 8.83*  CALCIUM 8.6*  GFRNONAA 5*  GFRAA 6*  ANIONGAP 11     Recent Labs Lab 12/22/16 0052  PROT 7.7  ALBUMIN 3.7  AST 12*  ALT 10*  ALKPHOS 102  BILITOT 0.5   Hematology Recent Labs  Lab 12/22/16 0052  WBC 6.8  RBC 3.79*  HGB 11.6*  HCT 34.4*  MCV 90.8    MCH 30.6  MCHC 33.7  RDW 15.7*  PLT 151   Cardiac Enzymes Recent Labs Lab 12/22/16 0053 12/22/16 0655 12/22/16 1225  TROPONINI 0.11* 0.10* 0.09*   No results for input(s): TROPIPOC in the last 168 hours.   Radiology/Studies:  Dg Chest Port 1 View  Result Date: 12/22/2016 CLINICAL DATA:  Acute respiratory distress tonight. EXAM: PORTABLE CHEST 1 VIEW COMPARISON:  10/21/2016 FINDINGS: Diffuse interstitial fluid or thickening, new. No airspace consolidation. There is a trace fluid in the right minor fissure, but no large effusion. Hilar, mediastinal and cardiac contours are unremarkable and unchanged. IMPRESSION: Diffuse interstitial fluid or interstitial infiltrate. Electronically Signed   By: Andreas Newport M.D.   On: 12/22/2016 00:58    Assessment and Plan:   1. Acute on chronic systolic heart failure - He is euvolemic. Volume managed by dialysis. No orthopnea or PND. - echocardiogram showed diffuse EF to 20-25% with diffuse hypokinesis and grade 1 diastolic dysfunction. Patient denies any angina. ICM vs hypertensive induced. Will review with MD.  2. Elevated troponin with hx of CAD - Prior cath 09/2012 showed 70% LAD. Managed medically due to multiple CVA and GI bleed.  - Mild elevated troponin. Trending down. EKG showed resolved TWI in inferior leads, persistent TWI in lateral leads. Continue Plavix, BB and statin.   3. Hypertension - Elevated at presentation. Says compliant with medication. Now stable. Continue current medication.   4. HLD - Continue statin. 06/14/2016: Cholesterol 131; HDL 31; LDL Cholesterol 69; Triglycerides 156; VLDL 31   5. Acute respiratory failure with pulmonary edema - Symptoms resolved  For questions or updates, please contact [place] Please consult [email or web address] for contact info under Cardiology/STEMI.   Jarrett Soho, PA  12/22/2016 2:23 PM

## 2016-12-23 NOTE — Progress Notes (Signed)
Site area: rt groin fa sheath Site Prior to Removal:  Level 0 Pressure Applied For:  20 minutes Manual:   yes Patient Status During Pull:  stable Post Pull Site:  Level  0 Post Pull Instructions Given:  yes Post Pull Pulses Present: palpable Dressing Applied:  Gauze and tegaderm Bedrest begins @ 1135 Comments:

## 2016-12-23 NOTE — Progress Notes (Signed)
PROGRESS NOTE   [name]  WNI:[id number]    DOB: [birth year]    DOA: 12/22/2016  PCP: Elise Benne   I have briefly reviewed patients previous medical records in [facility].  Brief Narrative:  51-year [name] ESRD on MWF HD,   Multifactorial anemia-Foll with oncology HTN,  HLD,  DM,  Stroke 2018,  CAD,  chronic combined systolic and diastolic CHF-Follows with [place] [facility] [facility] recurrent GI bleeding from AVM's, reportedly compliant with HD and medications,   presented with worsening dyspnea and chest comfort. Admitted for acute hypoxic respiratory failure secondary to flash pulmonary edema in the context of hypertensive urgency.   Briefly required BiPAP in ED. Status post HD on 4 L volume removed. Hypoxia resolved. 2-D echo shows worsening LVEF.  Cardiology consulted- [facility] on 10/9.   Assessment & Plan:   Principal Problem:   Flash pulmonary edema ([name]) Active Problems:   Essential hypertension   CAD (coronary artery disease)   Cerebral infarction ([name])   GERD (gastroesophageal reflux disease)   Diabetes mellitus type 2, controlled, with complications ([place])   ESRD (end stage renal disease) ([place])   Anemia due to chronic kidney disease   Acute respiratory failure with hypoxia ([name])   Acute on chronic combined systolic and diastolic CHF (congestive heart failure) ([name])   Elevated troponin I level   HLD (hyperlipidemia)   1. Acute on chronic systolic CHF/flash pulmonary edema: Repeat echo this admission shows LVEF 20-25 percent with diffuse hypokinesis and grade 1 diastolic dysfunction. Volume managed across HD today and had 4 L off. Euvolemic clinically. May have been precipitated by hypertensive urgency complicating worsening EF. 2. Cardiomyopathy-Ischemia  EF 06/2016 40%-->20-25% 10.8 [facility] on 10/9 as below-CVTS consulted for opinion. 3. Acute respiratory failure with hypoxia: Secondary to problem #1. Resolved. 4. Hypertensive urgency/essential hypertension: Improved after  meds and dialysis. Continue carvedilol. Cardiology discontinued amlodipine and started lisinopril. 5. CAD/elevated troponin: Prior cath 09/2012 showed 70% LAD managed medically due to multiple CVAs and GI bleed. Continue Plavix, carvedilol and statins. [place] 10/9. 6. History of CVA: No focal deficits. Continue Plavix and Crestor. 7. GERD: Protonix. 8. Hyperlipidemia: Continue Crestor. 9. Diet-controlled DM 2: A1c 4.9 on 06/14/16. 10. ESRD on MWF HD: Seen at HD this morning. Discussed with Dr. Hassell Done. 11. Anemia due to chronic kidney disease: Stable.   DVT prophylaxis: Heparin Code Status: Full Family Communication: None at bedside Disposition: DC home when medically improved   Consultants:  Cardiology Nephrology   Procedures:  HD   Conclusion  Cath 10/9   Mid LAD lesion, 70 %stenosed. -- FFR 0.80 (Cutoff for Physiologic Significance is 0.80)  There is severe left ventricular systolic dysfunction. The left ventricular ejection fraction is less than 25% by visual estimate.  LV end diastolic pressure is mildly elevated.  1st Diag lesion, 80 %stenosed. Very small caliber vessel.  Mid RCA lesion, 40 %stenosed.   Overall, I suspect that his severely reduced EF is out of proportion to his LAD lesion alone given the global hypokinesis, however it is of large-caliber wraparound LAD, and therefore he would likely benefit from revascularization.  There are concerns are about his recurrent GI bleeds and ability take dual antiplatelet therapy.   We will discontinue Plavix, and consult CT surgery for their opinion. - Consider LIMA-LAD with potential backup plan being bare-metal stent to the LAD   With no obvious culprit lesion, we will stop IV heparin      Antimicrobials:  None    Subjective:  No cp, no sob No n/v Eating and driking failry No pain in groin   Objective:  Vitals:   12/22/16 1917 12/23/16 0000 12/23/16 0442 12/23/16 0500  BP: 122/70 107/66 121/63     Pulse: 91 89 90   Resp: 15 14 13    Temp: 97.7 F (36.5 C) (!) 97.4 F (36.3 C) 97.6 F (36.4 C)   TempSrc: Oral Oral Oral   SpO2: 99% 96% 97%   Weight:    57 kg (125 lb 9.6 oz)  Height:        Examination:  General exam: Pleasant , poorly nourished, lying comfortably propped up in bed. Respiratory system: Clear to auscultation. Respiratory effort normal. Cardiovascular system: S1 & S2 heard, RRR. No JVD, murmurs Gastrointestinal system: Abdomen is soft and nontender, normal BS, NO HSM Central nervous system: Alert and oriented. No focal neurological deficits. Extremities: Symmetric 5 x 5 power. Left upper arm AV fistula. Skin: No rashes, lesions or ulcers Psychiatry: Judgement and insight appear normal. Mood & affect appropriate.     Data Reviewed: I have personally reviewed following labs and imaging studies  CBC:  Recent Labs Lab 12/22/16 0052 12/23/16 0336  WBC 6.8 3.1*  NEUTROABS 4.5  --   HGB 11.6* 11.8*  HCT 34.4* 34.6*  MCV 90.8 88.0  PLT 151 458   Basic Metabolic Panel:  Recent Labs Lab 12/22/16 0052 12/23/16 0336  NA 138 133*  K 3.9 4.0  CL 101 93*  CO2 26 27  GLUCOSE 150* 122*  BUN 40* 28*  CREATININE 8.83* 7.20*  CALCIUM 8.6* 8.5*   Liver Function Tests:  Recent Labs Lab 12/22/16 0052  AST 12*  ALT 10*  ALKPHOS 102  BILITOT 0.5  PROT 7.7  ALBUMIN 3.7   Coagulation Profile:  Recent Labs Lab 12/23/16 0336  INR 0.99   Cardiac Enzymes:  Recent Labs Lab 12/22/16 0053 12/22/16 0655 12/22/16 1225 12/22/16 1819  TROPONINI 0.11* 0.10* 0.09* 0.07*   HbA1C: No results for input(s): HGBA1C in the last 72 hours. CBG:  Recent Labs Lab 12/22/16 0430  GLUCAP 130*    Recent Results (from the past 240 hour(s))  MRSA PCR Screening     Status: None   Collection Time: 12/22/16  4:51 AM  Result Value Ref Range Status   MRSA by PCR NEGATIVE NEGATIVE Final    Comment:        The GeneXpert MRSA Assay (FDA approved for NASAL  specimens only), is one component of a comprehensive MRSA colonization surveillance program. It is not intended to diagnose MRSA infection nor to guide or monitor treatment for MRSA infections.       Radiology Studies: Dg Chest Port 1 View  Result Date: 12/22/2016 CLINICAL DATA:  Acute respiratory distress tonight. EXAM: PORTABLE CHEST 1 VIEW COMPARISON:  10/21/2016 FINDINGS: Diffuse interstitial fluid or thickening, new. No airspace consolidation. There is a trace fluid in the right minor fissure, but no large effusion. Hilar, mediastinal and cardiac contours are unremarkable and unchanged. IMPRESSION: Diffuse interstitial fluid or interstitial infiltrate. Electronically Signed   By: Andreas Newport M.D.   On: 12/22/2016 00:58    Scheduled Meds: . calcium acetate  667 mg Oral Q breakfast  . carvedilol  3.125 mg Oral BID WC  . clopidogrel  75 mg Oral Daily  . famotidine  20 mg Oral Daily  . gabapentin  300 mg Oral QHS  . heparin  5,000 Units Subcutaneous Q8H  . lisinopril  10 mg Oral Daily  .  loratadine  10 mg Oral Daily  . multivitamin  1 tablet Oral Daily  . nicotine  21 mg Transdermal Daily  . [START ON 12/24/2016] pneumococcal 23 valent vaccine  0.5 mL Intramuscular Tomorrow-1000  . rosuvastatin  40 mg Oral Daily  . sodium chloride flush  3 mL Intravenous Q12H  . venlafaxine XR  37.5 mg Oral Q breakfast   Continuous Infusions: . sodium chloride    . sodium chloride 10 mL/hr at 12/23/16 0520     LOS: 1 day    Verneita Griffes, MD [facility] [phone]

## 2016-12-23 NOTE — Progress Notes (Signed)
Progress Note  Patient Name: [name] Date of Encounter: 12/23/2016  Primary Cardiologist: Dr. Irish Lack  Subjective   Feeling well.  Denies chest pain or shortness of breath.    Inpatient Medications    Scheduled Meds: . [MAR Hold] calcium acetate  667 mg Oral Q breakfast  . [MAR Hold] carvedilol  3.125 mg Oral BID WC  . [MAR Hold] famotidine  20 mg Oral Daily  . [MAR Hold] gabapentin  300 mg Oral QHS  . [MAR Hold] heparin  5,000 Units Subcutaneous Q8H  . lisinopril  5 mg Oral Daily  . [MAR Hold] loratadine  10 mg Oral Daily  . [MAR Hold] multivitamin  1 tablet Oral Daily  . [MAR Hold] nicotine  21 mg Transdermal Daily  . [MAR Hold] pneumococcal 23 valent vaccine  0.5 mL Intramuscular Tomorrow-1000  . [MAR Hold] rosuvastatin  40 mg Oral Daily  . sodium chloride flush  3 mL Intravenous Q12H  . [MAR Hold] venlafaxine XR  37.5 mg Oral Q breakfast   Continuous Infusions: . sodium chloride    . sodium chloride 10 mL/hr at 12/23/16 0520   PRN Meds: sodium chloride, [MAR Hold] acetaminophen, [MAR Hold]  morphine injection, [MAR Hold] nitroGLYCERIN, [MAR Hold] ondansetron (ZOFRAN) IV, sodium chloride flush, [MAR Hold] zolpidem   Vital Signs    Vitals:   12/23/16 1010 12/23/16 1025 12/23/16 1040 12/23/16 1055  BP: 126/88 124/80 (!) 130/92 124/87  Pulse: 81 79 85 85  Resp: 14 16 17 18   Temp:      TempSrc:      SpO2: 99% 96% 98% 96%  Weight:      Height:        Intake/Output Summary (Last 24 hours) at 12/23/16 1123 Last data filed at 12/23/16 0600  Gross per 24 hour  Intake           246.67 ml  Output                0 ml  Net           246.67 ml   Filed Weights   12/22/16 0610 12/22/16 1017 12/23/16 0500  Weight: 60.2 kg (132 lb 11.5 oz) 55.9 kg (123 lb 3.8 oz) 57 kg (125 lb 9.6 oz)    Telemetry    Sinus rhythm.  Occasional PVCs.  - Personally Reviewed  ECG    n/a - Personally Reviewed  Physical Exam   GEN: Chronically ill-appearing.  No acute  distress.   HEENT: Poor dentition Neck: No JVD Cardiac: RRR, no murmurs, rubs, or gallops.  Respiratory: Clear to auscultation bilaterally. GI: Soft, nontender, non-distended  MS: No edema; No deformity. Neuro:  Nonfocal  Psych: Normal affect   Labs    Chemistry Recent Labs Lab 12/22/16 0052 12/23/16 0336  NA 138 133*  K 3.9 4.0  CL 101 93*  CO2 26 27  GLUCOSE 150* 122*  BUN 40* 28*  CREATININE 8.83* 7.20*  CALCIUM 8.6* 8.5*  PROT 7.7  --   ALBUMIN 3.7  --   AST 12*  --   ALT 10*  --   ALKPHOS 102  --   BILITOT 0.5  --   GFRNONAA 5* 7*  GFRAA 6* 8*  ANIONGAP 11 13     Hematology Recent Labs Lab 12/22/16 0052 12/23/16 0336  WBC 6.8 3.1*  RBC 3.79* 3.93*  HGB 11.6* 11.8*  HCT 34.4* 34.6*  MCV 90.8 88.0  MCH 30.6 30.0  MCHC 33.7  34.1  RDW 15.7* 16.1*  PLT 151 150    Cardiac Enzymes Recent Labs Lab 12/22/16 0053 12/22/16 0655 12/22/16 1225 12/22/16 1819  TROPONINI 0.11* 0.10* 0.09* 0.07*   No results for input(s): TROPIPOC in the last 168 hours.   BNPNo results for input(s): BNP, PROBNP in the last 168 hours.   DDimer No results for input(s): DDIMER in the last 168 hours.   Radiology    Dg Chest Port 1 View  Result Date: 12/22/2016 CLINICAL DATA:  Acute respiratory distress tonight. EXAM: PORTABLE CHEST 1 VIEW COMPARISON:  10/21/2016 FINDINGS: Diffuse interstitial fluid or thickening, new. No airspace consolidation. There is a trace fluid in the right minor fissure, but no large effusion. Hilar, mediastinal and cardiac contours are unremarkable and unchanged. IMPRESSION: Diffuse interstitial fluid or interstitial infiltrate. Electronically Signed   By: Andreas Newport M.D.   On: 12/22/2016 00:58    Cardiac Studies   Echo 12/23/16: Study Conclusions  - Left ventricle: The cavity size was normal. Wall thickness was   increased in a pattern of moderate LVH. Systolic function was   severely reduced. The estimated ejection fraction was in the    range of 20% to 25%. Diffuse hypokinesis. Doppler parameters are   consistent with abnormal left ventricular relaxation (grade 1   diastolic dysfunction). Doppler parameters are consistent with   high ventricular filling pressure.  Impressions:  - Severe global reduction in LV systolic function; mild diastolic   dysfunction; moderate LVH; trace MR and TR.   Patient Profile     [name] is a 9M with medically managed CAD, chronic systolic and diastolic heart failure, ESRD on HD, prior CVA, recurrent GI bleed, hypertension, and hyperlipidemia here with flash pulmonary edema and acute hypoxic respiratory failure.    Assessment & Plan    # Acute on chronic systolic and diastolic heart failure:  # Elevated troponin: # Obstructive CAD:  LVEF reduced to 20% from 45-505 06/2016.  Amlodipine was discontinued and he was started on losartan.  Continue carvedilol.  He has a 70% mid LAD lesion that is slightly worse than his prior cath.  FFR barely positive at 0.8.  He isn't a good candidate for PCI given his recurrent GI blood from AVMs.  He has been tolerating clopidogrel alone for several months.  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  We will ask CT surgery to see him and weigh in on possible minimally invasive LIMA-->LAD.  If he is not a candidate for this would consider BMS to LAD.  Clopidogrel currently on hold until a decision is made.   # Hypoxic respiratory failure: # Flash pulmonary edema: Stable.  LVEDP 15.  Unclear if this was just volume overload or ischemic.  Considering CAD treatment options as above.   For questions or updates, please contact [facility] Please consult [email or web address] for contact info under Cardiology/STEMI.      Signed, Skeet Latch, MD  12/23/2016, 11:23 AM

## 2016-12-23 NOTE — Plan of Care (Signed)
Problem: Physical Regulation: Goal: Ability to maintain clinical measurements within normal limits will improve Outcome: Progressing BP 130s systolic, NSR, no pain, groin site level 0, pulses 2+ wrists/1+ feet bilateral, RR WDL  Problem: Fluid Volume: Goal: Ability to maintain a balanced intake and output will improve Outcome: Progressing Dialysis tomorrow (M/W/F)  Problem: Nutrition: Goal: Adequate nutrition will be maintained Outcome: Completed/Met Date Met: 12/23/16 Ate 100% of lunch & dinner  Problem: Cardiovascular: Goal: Vascular access site(s) Level 0-1 will be maintained Outcome: Progressing Level 0; continue to monitor  Problem: Health Behavior/Discharge Planning: Goal: Ability to safely manage health-related needs after discharge will improve Outcome: Progressing Pt education reinforced related to procedure; pt educated about needs related to groin site; pt has good judgement & understanding/application of education   

## 2016-12-23 NOTE — Interval H&P Note (Signed)
History and Physical Interval Note:  12/23/2016 7:29 AM  [name]  has presented today for surgery, with the diagnosis of Dilated CM & mLAD CAD with worsening CHF>  The various methods of treatment have been discussed with the patient and family. After consideration of risks, benefits and other options for treatment, the patient has consented to  Procedure(s): LEFT HEART CATH AND CORONARY ANGIOGRAPHY (N/A) as a surgical intervention .  The patient's history has been reviewed, patient examined, no change in status, stable for surgery.  I have reviewed the patient's chart and labs.  Questions were answered to the patient's satisfaction.     Glenetta Hew

## 2016-12-24 ENCOUNTER — Encounter: Payer: Self-pay | Admitting: Physician Assistant

## 2016-12-24 ENCOUNTER — Inpatient Hospital Stay (HOSPITAL_COMMUNITY): Payer: Medicare Other

## 2016-12-24 ENCOUNTER — Other Ambulatory Visit (HOSPITAL_COMMUNITY): Payer: Medicare Other

## 2016-12-24 DIAGNOSIS — J810 Acute pulmonary edema: Secondary | ICD-10-CM

## 2016-12-24 DIAGNOSIS — N186 End stage renal disease: Secondary | ICD-10-CM

## 2016-12-24 DIAGNOSIS — I169 Hypertensive crisis, unspecified: Secondary | ICD-10-CM

## 2016-12-24 DIAGNOSIS — I10 Essential (primary) hypertension: Secondary | ICD-10-CM

## 2016-12-24 DIAGNOSIS — I5023 Acute on chronic systolic (congestive) heart failure: Secondary | ICD-10-CM

## 2016-12-24 DIAGNOSIS — I2510 Atherosclerotic heart disease of native coronary artery without angina pectoris: Secondary | ICD-10-CM

## 2016-12-24 LAB — PULMONARY FUNCTION TEST
DL/VA % PRED: 84 %
DL/VA: 3.73 ml/min/mmHg/L
DLCO COR % PRED: 50 %
DLCO COR: 14.23 ml/min/mmHg
DLCO UNC % PRED: 45 %
DLCO UNC: 12.81 ml/min/mmHg
FEF 25-75 POST: 1.8 L/s
FEF 25-75 PRE: 1.81 L/s
FEF2575-%CHANGE-POST: 0 %
FEF2575-%PRED-POST: 81 %
FEF2575-%Pred-Pre: 82 %
FEV1-%Change-Post: 2 %
FEV1-%PRED-POST: 69 %
FEV1-%Pred-Pre: 67 %
FEV1-PRE: 1.71 L
FEV1-Post: 1.76 L
FEV1FVC-%Change-Post: -3 %
FEV1FVC-%PRED-PRE: 105 %
FEV6-%CHANGE-POST: 9 %
FEV6-%PRED-POST: 71 %
FEV6-%Pred-Pre: 65 %
FEV6-PRE: 2.1 L
FEV6-Post: 2.29 L
FEV6FVC-%PRED-POST: 105 %
FEV6FVC-%PRED-PRE: 105 %
FVC-%Change-Post: 7 %
FVC-%Pred-Post: 67 %
FVC-%Pred-Pre: 63 %
FVC-Post: 2.29 L
FVC-Pre: 2.14 L
POST FEV1/FVC RATIO: 77 %
PRE FEV1/FVC RATIO: 80 %
Post FEV6/FVC ratio: 100 %
Pre FEV6/FVC Ratio: 100 %
RV % PRED: 122 %
RV: 2.8 L
TLC % pred: 79 %
TLC: 5.08 L

## 2016-12-24 LAB — CBC WITH DIFFERENTIAL/PLATELET
BASOS ABS: 0 10*3/uL (ref 0.0–0.1)
BASOS PCT: 0 %
EOS PCT: 3 %
Eosinophils Absolute: 0.1 10*3/uL (ref 0.0–0.7)
HCT: 34 % — ABNORMAL LOW (ref 39.0–52.0)
Hemoglobin: 11.5 g/dL — ABNORMAL LOW (ref 13.0–17.0)
Lymphocytes Relative: 32 %
Lymphs Abs: 1.3 10*3/uL (ref 0.7–4.0)
MCH: 29.6 pg (ref 26.0–34.0)
MCHC: 33.8 g/dL (ref 30.0–36.0)
MCV: 87.6 fL (ref 78.0–100.0)
MONO ABS: 0.5 10*3/uL (ref 0.1–1.0)
Monocytes Relative: 13 %
Neutro Abs: 2.1 10*3/uL (ref 1.7–7.7)
Neutrophils Relative %: 52 %
PLATELETS: 171 10*3/uL (ref 150–400)
RBC: 3.88 MIL/uL — ABNORMAL LOW (ref 4.22–5.81)
RDW: 16.2 % — AB (ref 11.5–15.5)
WBC: 4.1 10*3/uL (ref 4.0–10.5)

## 2016-12-24 LAB — COMPREHENSIVE METABOLIC PANEL
ALBUMIN: 3.1 g/dL — AB (ref 3.5–5.0)
ALT: 9 U/L — ABNORMAL LOW (ref 17–63)
ANION GAP: 14 (ref 5–15)
AST: 10 U/L — AB (ref 15–41)
Alkaline Phosphatase: 86 U/L (ref 38–126)
BUN: 41 mg/dL — AB (ref 6–20)
CHLORIDE: 93 mmol/L — AB (ref 101–111)
CO2: 26 mmol/L (ref 22–32)
Calcium: 8.4 mg/dL — ABNORMAL LOW (ref 8.9–10.3)
Creatinine, Ser: 9.11 mg/dL — ABNORMAL HIGH (ref 0.61–1.24)
GFR calc Af Amer: 6 mL/min — ABNORMAL LOW (ref >60)
GFR calc non Af Amer: 5 mL/min — ABNORMAL LOW (ref >60)
GLUCOSE: 100 mg/dL — AB (ref 65–99)
POTASSIUM: 4.4 mmol/L (ref 3.5–5.1)
Sodium: 133 mmol/L — ABNORMAL LOW (ref 135–145)
Total Bilirubin: 0.5 mg/dL (ref 0.3–1.2)
Total Protein: 6.8 g/dL (ref 6.5–8.1)

## 2016-12-24 LAB — GLUCOSE, CAPILLARY: GLUCOSE-CAPILLARY: 91 mg/dL (ref 65–99)

## 2016-12-24 MED ORDER — PENTAFLUOROPROP-TETRAFLUOROETH EX AERO
1.0000 "application " | INHALATION_SPRAY | CUTANEOUS | Status: DC | PRN
  Filled 2016-12-24: qty 30

## 2016-12-24 MED ORDER — LIDOCAINE HCL (PF) 1 % IJ SOLN: 5.0000 mL | INTRAMUSCULAR | Status: DC | PRN

## 2016-12-24 MED ORDER — LIDOCAINE-PRILOCAINE 2.5-2.5 % EX CREA
1.0000 | TOPICAL_CREAM | CUTANEOUS | Status: DC | PRN
Start: 2016-12-24 — End: 2016-12-25
  Filled 2016-12-24: qty 5

## 2016-12-24 MED ORDER — ALTEPLASE 2 MG IJ SOLR: 2.0000 mg | Freq: Once | INTRAMUSCULAR | Status: DC | PRN

## 2016-12-24 MED ORDER — SODIUM CHLORIDE 0.9 % IV SOLN: 100.0000 mL | INTRAVENOUS | Status: DC | PRN

## 2016-12-24 MED ORDER — HEPARIN SODIUM (PORCINE) 1000 UNIT/ML DIALYSIS: 1000.0000 [IU] | INTRAMUSCULAR | Status: DC | PRN

## 2016-12-24 MED ORDER — ALBUTEROL SULFATE (2.5 MG/3ML) 0.083% IN NEBU
2.5000 mg | INHALATION_SOLUTION | Freq: Once | RESPIRATORY_TRACT | Status: AC
  Administered 2016-12-24: 2.5 mg via RESPIRATORY_TRACT

## 2016-12-24 MED FILL — Lidocaine HCl Local Inj 2%: INTRAMUSCULAR | Qty: 10 | Status: AC

## 2016-12-24 NOTE — Progress Notes (Signed)
CARDIAC REHAB PHASE I   PRE:  Rate/Rhythm: 80 SR  BP:  Supine: 115/74  Sitting:   Standing:    SaO2: 100%RA  MODE:  Ambulation: 340 ft   POST:  Rate/Rhythm: 94 SR  BP:  Supine:   Sitting: 129/75  Standing:    SaO2: 97%RA 1007-1027 Pt walked 340 ft on RA with rolling walker. Uses cane at home. Has a little right sided weakness from CVA but tolerated well. Denied CP or SOB. To bed after walk. Will follow up with ed after plan established.   Graylon Good, RN BSN  12/24/2016 10:24 AM

## 2016-12-24 NOTE — Progress Notes (Signed)
[name] ROUNDING NOTE   Subjective:   Interval History: 71 y.o.malewith ESRD, CAD, Type 2 DM, prostate cancer, Hx CVA, and Hx GI bleed (due to AVMs), combined HF who was admitted this am with SOB and pulm edema. Cardiac catheterization 10/9  70% mid LAD lesion that is slightly worse than his prior cath. CT surgery consulted  Objective:  Vital signs in last 24 hours:  Temp:  [97.6 F (36.4 C)-98 F (36.7 C)] 97.7 F (36.5 C) (10/10 0800) Pulse Rate:  [78-91] 84 (10/10 0834) Resp:  [10-18] 12 (10/10 0332) BP: (91-136)/(71-93) 121/73 (10/10 0800) SpO2:  [96 %-100 %] 97 % (10/10 0834) Weight:  [126 lb 1.7 oz (57.2 kg)] 126 lb 1.7 oz (57.2 kg) (10/10 0500)  Weight change: 2 lb 13.9 oz (1.3 kg) Filed Weights   12/22/16 1017 12/23/16 0500 12/24/16 0500  Weight: 123 lb 3.8 oz (55.9 kg) 125 lb 9.6 oz (57 kg) 126 lb 1.7 oz (57.2 kg)    Intake/Output: I/O last 3 completed shifts: In: 866.7 [P.O.:840; I.V.:26.7] Out: -    Intake/Output this shift:  Total I/O In: 240 [P.O.:240] Out: -   CVS- RRR no JVP  RS- CTA ABD- BS present soft non-distended EXT- no edema   Basic Metabolic Panel:  Recent Labs Lab 12/22/16 0052 12/23/16 0336 12/24/16 0347  NA 138 133* 133*  K 3.9 4.0 4.4  CL 101 93* 93*  CO2 26 27 26   GLUCOSE 150* 122* 100*  BUN 40* 28* 41*  CREATININE 8.83* 7.20* 9.11*  CALCIUM 8.6* 8.5* 8.4*    Liver Function Tests:  Recent Labs Lab 12/22/16 0052 12/24/16 0347  AST 12* 10*  ALT 10* 9*  ALKPHOS 102 86  BILITOT 0.5 0.5  PROT 7.7 6.8  ALBUMIN 3.7 3.1*    Recent Labs Lab 12/22/16 0052  LIPASE 66*   No results for input(s): AMMONIA in the last 168 hours.  CBC:  Recent Labs Lab 12/22/16 0052 12/23/16 0336 12/24/16 0347  WBC 6.8 3.1* 4.1  NEUTROABS 4.5  --  2.1  HGB 11.6* 11.8* 11.5*  HCT 34.4* 34.6* 34.0*  MCV 90.8 88.0 87.6  PLT 151 150 171    Cardiac Enzymes:  Recent Labs Lab 12/22/16 0053 12/22/16 0655  12/22/16 1225 12/22/16 1819  TROPONINI 0.11* 0.10* 0.09* 0.07*    BNP: Invalid input(s): POCBNP  CBG:  Recent Labs Lab 12/22/16 0430 12/23/16 0917 12/24/16 0833  GLUCAP 130* 125* 71    Microbiology: Results for orders placed or performed during the hospital encounter of 12/22/16  MRSA PCR Screening     Status: None   Collection Time: 12/22/16  4:51 AM  Result Value Ref Range Status   MRSA by PCR NEGATIVE NEGATIVE Final    Comment:        The GeneXpert MRSA Assay (FDA approved for NASAL specimens only), is one component of a comprehensive MRSA colonization surveillance program. It is not intended to diagnose MRSA infection nor to guide or monitor treatment for MRSA infections.     Coagulation Studies:  Recent Labs  12/23/16 0336  LABPROT 13.0  INR 0.99    Urinalysis: No results for input(s): COLORURINE, LABSPEC, PHURINE, GLUCOSEU, HGBUR, BILIRUBINUR, KETONESUR, PROTEINUR, UROBILINOGEN, NITRITE, LEUKOCYTESUR in the last 72 hours.  Invalid input(s): APPERANCEUR    Imaging: No results found.   Medications:   . sodium chloride     . calcium acetate  667 mg Oral Q breakfast  . carvedilol  3.125 mg Oral BID  WC  . famotidine  20 mg Oral Daily  . gabapentin  300 mg Oral QHS  . heparin  5,000 Units Subcutaneous Q8H  . lisinopril  5 mg Oral Daily  . loratadine  10 mg Oral Daily  . multivitamin  1 tablet Oral Daily  . nicotine  21 mg Transdermal Daily  . pneumococcal 23 valent vaccine  0.5 mL Intramuscular Tomorrow-1000  . rosuvastatin  40 mg Oral Daily  . sodium chloride flush  3 mL Intravenous Q12H  . venlafaxine XR  37.5 mg Oral Q breakfast   sodium chloride, acetaminophen, morphine injection, nitroGLYCERIN, ondansetron (ZOFRAN) IV, sodium chloride flush, zolpidem  Assessment/ Plan:  1. Acute pulm edema/ SOB - 2 -D echo shows worsening LVEF. 20-25 percent with diffuse hypokinesis Cardiology consulted and plan LHC on 10/9 to further assess.  Lisinopril started. 70% mid LAD lesion that is slightly worse than his prior cath appreciate Dr Oval Linsey . Consulted CT surgery    2. ESRD on HD MWF , [facility] 3. HTN - cont meds  Will decrease lisinopril to 5 mg  Volume removal with dialysis may be challenging with too low systolic blood pressure  4. Hx CVA 5. Anemia of CKD - Hb 11.6 no ESA     LOS: 2 Sandra Tellefsen W @TODAY @10 :04 AM

## 2016-12-24 NOTE — Consult Note (Signed)
Reason for Consult: CAD Referring Physician: Dr. [name] is an 71 y.o. male.  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 He presented to the ED on 10/8 with shortness of breath and coughing up white frothy sputum. A CXR showed pulmonary edema. BNP was 1389. Troponin mildly elevated at 0.11. He waqs severely hypertensive with a a BP of 160/110. He was treated with urgent dialysis.  An echocardiogram showed global hypokinesis with an EF of 20%. At cath he was found to have a 70% mid LAD lesion with an FFR of 0.80. There was severe disease in a small diagonal branch.  He denies CP. There are conflicting reports in the notes of chest pain v LUQ abdominal pain.  Past Medical History:  Diagnosis Date  . 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 Anemia, iron deficiency 03/24/2011   a. Recurrent GI bleed, tx with periodic iron infusions.  . 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 AVM (arteriovenous malformation) of duodenum, acquired    egds in 01/2012, 04/2011  . BPH (benign prostatic hyperplasia)   . CAD (coronary artery disease)    a. Cath 09/2012: moderate borderline CAD in mid LAD/small diagonal branch, mild RCA stenosis, to be managed medically   . Chronic lower back pain   . Diabetic peripheral neuropathy ([place]) 2014   foot pain.  . Essential hypertension 02/01/2012  . Fatty liver    on CT of 11/2010  . GERD (gastroesophageal reflux disease)   . GI bleed    a. Recurrent GI bleed, tx with IV iron. b. Per heme notes - likely AVMs 04/2012 (tx with cauterization several months ago).  . Hematuria    a. Urology note scan from 07/2012: cystoscopy without evidence for bladder lesion, only lateral hypertrophy of posterior urethra, bladder impression  from BPH. b. Pt states he had "some tests" scheduled for later in July 2014.  . High cholesterol   . Hyperlipidemia 02/01/2012  . Hypertension   . Hypertensive urgency 07/06/2016  . 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 Orthostatic hypotension    a. Tx with florinef.  . Peripheral neuropathy   . Polyp, colonic    Colonoscopy 01/2012 "benign" polyp  . Prostate cancer ([place])   . Sleep apnea    "had mask; couldn't sleep in it" (09/20/2012)  . Smoker   . SOB (shortness of breath) 07/06/2016  . Stage III chronic kidney disease ([name])    a. Stage 3 (DM with complications ->CKD, peripheral neuropathy).  . Stroke ([place])    x 2  . Symptomatic anemia 04/03/2015  . Tinea pedis 05/24/2012  . Tobacco use 02/01/2012  . Type 2 diabetes, uncontrolled, with neuropathy ([place]) 02/01/2012  . Type II diabetes mellitus ([place])    a. Dx 1994, uncontrolled.     Past Surgical History:  Procedure Laterality Date  . ENTEROSCOPY N/A 03/01/2013   Procedure: ENTEROSCOPY;  Surgeon: Inda Castle, MD;  Location: [facility];  Service: Endoscopy;  Laterality: N/A;  . ENTEROSCOPY N/A 06/26/2016   Procedure: ENTEROSCOPY;  Surgeon: Milus Banister, MD;  Location: [facility];  Service: Endoscopy;  Laterality: N/A;  this is a push enteroscopy  . ENTEROSCOPY N/A 10/23/2016   Procedure: ENTEROSCOPY;  Surgeon: Gatha Mayer, MD;  Location: [facility];  Service: Endoscopy;  Laterality: N/A;  . GIVENS CAPSULE STUDY N/A 04/05/2015   Procedure: GIVENS CAPSULE STUDY;  Surgeon: Gatha Mayer, MD;  Location: [facility];  Service: Endoscopy;  Laterality: N/A;  . INGUINAL HERNIA REPAIR Right 2011  . LEFT HEART CATH AND CORONARY ANGIOGRAPHY N/A 12/23/2016   Procedure: LEFT HEART CATH AND CORONARY ANGIOGRAPHY;  Surgeon: Leonie Man, MD;  Location: [facility];  Service: Cardiovascular;  Laterality: N/A;  . LEFT HEART CATHETERIZATION WITH CORONARY ANGIOGRAM N/A 09/21/2012   Procedure: LEFT HEART  CATHETERIZATION WITH CORONARY ANGIOGRAM;  Surgeon: Peter M Martinique, MD;  Location: [facility];  Service: Cardiovascular;  Laterality: N/A;  . LUMBAR DISC SURGERY  1980's  . LYMPHADENECTOMY Bilateral 01/19/2013   Procedure: LYMPHADENECTOMY;  Surgeon: Bernestine Amass, MD;  Location: [facility];  Service: Urology;  Laterality: Bilateral;  . ROBOT ASSISTED LAPAROSCOPIC RADICAL PROSTATECTOMY N/A 01/19/2013   Procedure: ROBOTIC ASSISTED LAPAROSCOPIC RADICAL PROSTATECTOMY;  Surgeon: Bernestine Amass, MD;  Location: [facility];  Service: Urology;  Laterality: N/A;  . SHOULDER OPEN ROTATOR CUFF REPAIR Left 1980's    Family History  Problem Relation Age of Onset  . Stroke Father        Died at 76  . Diabetes Mother   . Heart attack Brother        Died at 73  . Diabetes Sister   . Stomach cancer Brother   . Heart attack Sister     Social History:  reports that he has quit smoking. His smoking use included Cigarettes. He started smoking about 48 years ago. He has a 30.00 pack-year smoking history. He has never used smokeless tobacco. He reports that he does not drink alcohol or use drugs.  Allergies: No Known Allergies  Medications:  Scheduled: . calcium acetate  667 mg Oral Q breakfast  . carvedilol  3.125 mg Oral BID WC  . famotidine  20 mg Oral Daily  . gabapentin  300 mg Oral QHS  . heparin  5,000 Units Subcutaneous Q8H  . lisinopril  5 mg Oral Daily  . loratadine  10 mg Oral Daily  . multivitamin  1 tablet Oral Daily  . nicotine  21 mg Transdermal Daily  . pneumococcal 23 valent vaccine  0.5 mL Intramuscular Tomorrow-1000  . rosuvastatin  40 mg Oral Daily  . sodium chloride flush  3 mL Intravenous Q12H  . venlafaxine XR  37.5 mg Oral Q breakfast    Results for orders placed or performed during the hospital encounter of 12/22/16 (from the past 48 hour(s))  Troponin I (q 6hr x 3)     Status: Abnormal   Collection Time: 12/22/16  6:19 PM  Result Value Ref Range   Troponin I 0.07 (HH) <0.03  ng/mL    Comment: CRITICAL VALUE NOTED.  VALUE IS CONSISTENT WITH PREVIOUSLY REPORTED AND CALLED VALUE.  Basic metabolic panel     Status: Abnormal   Collection Time: 12/23/16  3:36 AM  Result Value Ref Range   Sodium 133 (L) 135 - 145 mmol/L   Potassium 4.0 3.5 - 5.1 mmol/L   Chloride 93 (L) 101 - 111 mmol/L   CO2 27 22 - 32 mmol/L   Glucose, Bld 122 (H) 65 - 99 mg/dL   BUN 28 (H) 6 - 20 mg/dL   Creatinine, Ser 7.20 (H) 0.61 - 1.24 mg/dL   Calcium 8.5 (L) 8.9 - 10.3 mg/dL   GFR calc non Af Amer 7 (  L) >60 mL/min   GFR calc Af Amer 8 (L) >60 mL/min    Comment: (NOTE) The eGFR has been calculated using the CKD EPI equation. This calculation has not been validated in all clinical situations. eGFR's persistently <60 mL/min signify possible Chronic Kidney Disease.    Anion gap 13 5 - 15  CBC     Status: Abnormal   Collection Time: 12/23/16  3:36 AM  Result Value Ref Range   WBC 3.1 (L) 4.0 - 10.5 K/uL   RBC 3.93 (L) 4.22 - 5.81 MIL/uL   Hemoglobin 11.8 (L) 13.0 - 17.0 g/dL   HCT 34.6 (L) 39.0 - 52.0 %   MCV 88.0 78.0 - 100.0 fL   MCH 30.0 26.0 - 34.0 pg   MCHC 34.1 30.0 - 36.0 g/dL   RDW 16.1 (H) 11.5 - 15.5 %   Platelets 150 150 - 400 K/uL  Protime-INR     Status: None   Collection Time: 12/23/16  3:36 AM  Result Value Ref Range   Prothrombin Time 13.0 11.4 - 15.2 seconds   INR 0.99   POCT Activated clotting time     Status: None   Collection Time: 12/23/16  8:18 AM  Result Value Ref Range   Activated Clotting Time 252 seconds  Glucose, capillary     Status: Abnormal   Collection Time: 12/23/16  9:17 AM  Result Value Ref Range   Glucose-Capillary 125 (H) 65 - 99 mg/dL  POCT Activated clotting time     Status: None   Collection Time: 12/23/16  9:18 AM  Result Value Ref Range   Activated Clotting Time 219 seconds  POCT Activated clotting time     Status: None   Collection Time: 12/23/16 10:35 AM  Result Value Ref Range   Activated Clotting Time 191 seconds  POCT  Activated clotting time     Status: None   Collection Time: 12/23/16 11:02 AM  Result Value Ref Range   Activated Clotting Time 180 seconds  CBC with Differential/Platelet     Status: Abnormal   Collection Time: 12/24/16  3:47 AM  Result Value Ref Range   WBC 4.1 4.0 - 10.5 K/uL   RBC 3.88 (L) 4.22 - 5.81 MIL/uL   Hemoglobin 11.5 (L) 13.0 - 17.0 g/dL   HCT 34.0 (L) 39.0 - 52.0 %   MCV 87.6 78.0 - 100.0 fL   MCH 29.6 26.0 - 34.0 pg   MCHC 33.8 30.0 - 36.0 g/dL   RDW 16.2 (H) 11.5 - 15.5 %   Platelets 171 150 - 400 K/uL   Neutrophils Relative % 52 %   Neutro Abs 2.1 1.7 - 7.7 K/uL   Lymphocytes Relative 32 %   Lymphs Abs 1.3 0.7 - 4.0 K/uL   Monocytes Relative 13 %   Monocytes Absolute 0.5 0.1 - 1.0 K/uL   Eosinophils Relative 3 %   Eosinophils Absolute 0.1 0.0 - 0.7 K/uL   Basophils Relative 0 %   Basophils Absolute 0.0 0.0 - 0.1 K/uL  Comprehensive metabolic panel     Status: Abnormal   Collection Time: 12/24/16  3:47 AM  Result Value Ref Range   Sodium 133 (L) 135 - 145 mmol/L   Potassium 4.4 3.5 - 5.1 mmol/L   Chloride 93 (L) 101 - 111 mmol/L   CO2 26 22 - 32 mmol/L   Glucose, Bld 100 (H) 65 - 99 mg/dL   BUN 41 (H) 6 - 20 mg/dL   Creatinine, Ser 9.11 (H)  0.61 - 1.24 mg/dL   Calcium 8.4 (L) 8.9 - 10.3 mg/dL   Total Protein 6.8 6.5 - 8.1 g/dL   Albumin 3.1 (L) 3.5 - 5.0 g/dL   AST 10 (L) 15 - 41 U/L   ALT 9 (L) 17 - 63 U/L   Alkaline Phosphatase 86 38 - 126 U/L   Total Bilirubin 0.5 0.3 - 1.2 mg/dL   GFR calc non Af Amer 5 (L) >60 mL/min   GFR calc Af Amer 6 (L) >60 mL/min    Comment: (NOTE) The eGFR has been calculated using the CKD EPI equation. This calculation has not been validated in all clinical situations. eGFR's persistently <60 mL/min signify possible Chronic Kidney Disease.    Anion gap 14 5 - 15  Glucose, capillary     Status: None   Collection Time: 12/24/16  8:33 AM  Result Value Ref Range   Glucose-Capillary 91 65 - 99 mg/dL    No results  found.  Review of Systems  Constitutional: Positive for malaise/fatigue. Negative for chills and fever.  HENT:       Hoarseness, missing teeth  Eyes: Negative for double vision.  Respiratory: Positive for cough and shortness of breath.   Cardiovascular: Positive for orthopnea. Negative for chest pain.  Neurological: Positive for focal weakness (left leg uses cane).   Blood pressure 121/73, pulse 83, temperature 97.7 F (36.5 C), temperature source Oral, resp. rate 12, height 5' 7"  (1.702 m), weight 126 lb 1.7 oz (57.2 kg), SpO2 99 %. Physical Exam  Constitutional: He is oriented to person, place, and time. He appears well-developed. No distress.  HENT:  Head: Normocephalic and atraumatic.  Mouth/Throat: No oropharyngeal exudate.  Poor dentition  Eyes: Conjunctivae and EOM are normal. No scleral icterus.  Neck: No thyromegaly present.  Cardiovascular: Normal rate, regular rhythm and normal heart sounds.   Respiratory: Effort normal and breath sounds normal. No respiratory distress. He has no wheezes. He has no rales.  GI: Soft. He exhibits no distension. There is no tenderness.  Musculoskeletal: He exhibits no edema.  Lymphadenopathy:    He has no cervical adenopathy.  Neurological: He is alert and oriented to person, place, and time. No cranial nerve deficit.  Good strength with limited exam  Skin: Skin is dry.   ECHOCARDIOGRAM Study Conclusions  - Left ventricle: The cavity size was normal. Wall thickness was   increased in a pattern of moderate LVH. Systolic function was   severely reduced. The estimated ejection fraction was in the   range of 20% to 25%. Diffuse hypokinesis. Doppler parameters are   consistent with abnormal left ventricular relaxation (grade 1   diastolic dysfunction). Doppler parameters are consistent with   high ventricular filling pressure.  Impressions:  - Severe global reduction in LV systolic function; mild diastolic   dysfunction; moderate  LVH; trace MR and TR.  CARDIAC CATHETERIZATION Conclusion     Mid LAD lesion, 70 %stenosed. -- FFR 0.80 (Cutoff for Physiologic Significance is 0.80)  There is severe left ventricular systolic dysfunction. The left ventricular ejection fraction is less than 25% by visual estimate.  LV end diastolic pressure is mildly elevated.  1st Diag lesion, 80 %stenosed. Very small caliber vessel.  Mid RCA lesion, 40 %stenosed.   Overall, I suspect that his severely reduced EF is out of proportion to his LAD lesion alone given the global hypokinesis, however it is of large-caliber wraparound LAD, and therefore he would likely benefit from revascularization.  There are concerns  are about his recurrent GI bleeds and ability take dual antiplatelet therapy.  Plan:  Return to nursing unit after sheath removal for ongoing care.  We will discontinue Plavix, and consult CT surgery for their opinion. - Consider LIMA-LAD with potential backup plan being bare-metal stent to the LAD   With no obvious culprit lesion, we will stop IV heparin    Glenetta Hew, M.D., M.S. Interventional Cardiologist    I personally reviewed the cath images. He has moderate LAD disease and more significant disease in a small diagonal branch.  Assessment/Plan: 71 yo man with multiple significant comorbid illnesses presented with pulmonary edema in the setting of a hypertensive crisis. He denies CP when talking with me but it was noted in several of the admission notes. He had marked elevation of his BNP and a mild increase in troponin. He has severe LV dysfunction with an EF of ~20% with global hypokinesis. He was found to have moderate LAD disease with a borderline significant FFR at cath.  I do not see an indication for CABG in [name] case. His presentation was primarily one of heart failure and he has LV dysfunction far out of proportion to his LAD disease. I do not think CABG would provide any symptomatic relief  or survival benefit. It does carry substantial risk given his co-morbidities. I DO think he would be a surgical candidate if his disease were to progress.  Recommend continued medical therapy.  Melrose Nakayama 12/24/2016, 12:37 PM

## 2016-12-24 NOTE — Progress Notes (Signed)
CT Surgery consult reviewed.  His LV dysfunction is thought to be out of proportion with the degree of CAD.  We will pursue medical management but will no prescribe antiplatelets given his GI bleed history.  Lisinopril was started this admission.  Continue carvedilol and rosuvastatin.  We will arrange outpatient follow up.  He is OK for discharge from a cardiology standpoint.  This has been discussed with Mr. Reinheimer and I will call his wife as well.    Lyndel Sarate C. Oval Linsey, MD, [facility]  12/24/2016 1:40 PM

## 2016-12-24 NOTE — Progress Notes (Signed)
Progress Note  Patient Name: [name] Date of Encounter: 12/24/2016  Primary Cardiologist: Dr. Irish Lack  Subjective   Feeling well. Wants to know when he can go home.   Inpatient Medications    Scheduled Meds: . calcium acetate  667 mg Oral Q breakfast  . carvedilol  3.125 mg Oral BID WC  . famotidine  20 mg Oral Daily  . gabapentin  300 mg Oral QHS  . heparin  5,000 Units Subcutaneous Q8H  . lisinopril  5 mg Oral Daily  . loratadine  10 mg Oral Daily  . multivitamin  1 tablet Oral Daily  . nicotine  21 mg Transdermal Daily  . pneumococcal 23 valent vaccine  0.5 mL Intramuscular Tomorrow-1000  . rosuvastatin  40 mg Oral Daily  . sodium chloride flush  3 mL Intravenous Q12H  . venlafaxine XR  37.5 mg Oral Q breakfast   Continuous Infusions: . sodium chloride     PRN Meds: sodium chloride, acetaminophen, morphine injection, nitroGLYCERIN, ondansetron (ZOFRAN) IV, sodium chloride flush, zolpidem   Vital Signs    Vitals:   12/24/16 0047 12/24/16 0332 12/24/16 0500 12/24/16 0800  BP: 117/80 124/83  121/73  Pulse: 83 82  82  Resp: 14 12    Temp: 97.8 F (36.6 C) 97.8 F (36.6 C)  97.7 F (36.5 C)  TempSrc: Oral Oral  Oral  SpO2: 100% 100%  98%  Weight:   57.2 kg (126 lb 1.7 oz)   Height:        Intake/Output Summary (Last 24 hours) at 12/24/16 0832 Last data filed at 12/23/16 2119  Gross per 24 hour  Intake              620 ml  Output                0 ml  Net              620 ml   Filed Weights   12/22/16 1017 12/23/16 0500 12/24/16 0500  Weight: 55.9 kg (123 lb 3.8 oz) 57 kg (125 lb 9.6 oz) 57.2 kg (126 lb 1.7 oz)    Telemetry    Sinus rhythm.  Occasional PVCs.  - Personally Reviewed  ECG    n/a - Personally Reviewed  Physical Exam   GEN: Chronically ill-appearing.  No acute distress.   HEENT: Poor dentition Neck: No JVD Cardiac: RRR, no murmurs, rubs, or gallops.  Respiratory: Clear to auscultation bilaterally. GI: Soft, nontender,  non-distended  MS: No edema; No deformity. Neuro:  Nonfocal  Psych: Normal affect   Labs    Chemistry  Recent Labs Lab 12/22/16 0052 12/23/16 0336 12/24/16 0347  NA 138 133* 133*  K 3.9 4.0 4.4  CL 101 93* 93*  CO2 26 27 26   GLUCOSE 150* 122* 100*  BUN 40* 28* 41*  CREATININE 8.83* 7.20* 9.11*  CALCIUM 8.6* 8.5* 8.4*  PROT 7.7  --  6.8  ALBUMIN 3.7  --  3.1*  AST 12*  --  10*  ALT 10*  --  9*  ALKPHOS 102  --  86  BILITOT 0.5  --  0.5  GFRNONAA 5* 7* 5*  GFRAA 6* 8* 6*  ANIONGAP 11 13 14      Hematology  Recent Labs Lab 12/22/16 0052 12/23/16 0336 12/24/16 0347  WBC 6.8 3.1* 4.1  RBC 3.79* 3.93* 3.88*  HGB 11.6* 11.8* 11.5*  HCT 34.4* 34.6* 34.0*  MCV 90.8 88.0 87.6  MCH 30.6  30.0 29.6  MCHC 33.7 34.1 33.8  RDW 15.7* 16.1* 16.2*  PLT 151 150 171    Cardiac Enzymes  Recent Labs Lab 12/22/16 0053 12/22/16 0655 12/22/16 1225 12/22/16 1819  TROPONINI 0.11* 0.10* 0.09* 0.07*   No results for input(s): TROPIPOC in the last 168 hours.   BNPNo results for input(s): BNP, PROBNP in the last 168 hours.   DDimer No results for input(s): DDIMER in the last 168 hours.   Radiology    No results found.  Cardiac Studies   Echo 12/23/16: Study Conclusions  - Left ventricle: The cavity size was normal. Wall thickness was   increased in a pattern of moderate LVH. Systolic function was   severely reduced. The estimated ejection fraction was in the   range of 20% to 25%. Diffuse hypokinesis. Doppler parameters are   consistent with abnormal left ventricular relaxation (grade 1   diastolic dysfunction). Doppler parameters are consistent with   high ventricular filling pressure.  Impressions:  - Severe global reduction in LV systolic function; mild diastolic   dysfunction; moderate LVH; trace MR and TR.   Patient Profile     Mr. Fines is a 45M with medically managed CAD, chronic systolic and diastolic heart failure, ESRD on HD, prior CVA, recurrent  GI bleed, hypertension, and hyperlipidemia here with flash pulmonary edema and acute hypoxic respiratory failure.    Assessment & Plan    # Acute on chronic systolic and diastolic heart failure:  # Elevated troponin: # Obstructive CAD:  LVEF reduced to 20% from 45-505 06/2016.  Amlodipine was discontinued and he was started on losartan.  Continue carvedilol.  He has a 70% mid LAD lesion that is slightly worse than his prior cath.  FFR barely positive at 0.8.  He isn't a good candidate for PCI given his recurrent GI blood from AVMs.  Although clopidogrel was on his medication list prior to admission, he hasn't been taking it since his endoscopy 10/2016.   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  We will ask CT surgery to see him and weigh in on possible minimally invasive LIMA-->LAD.  If this is not an option, medical management is likely best, but will discuss this with Mr. Timbrook and his wife once a decision about surgical candidacy is made.   # Hypoxic respiratory failure: # Flash pulmonary edema: Stable.  LVEDP 15.  Unclear if this was just volume overload or ischemic.  Considering CAD treatment options as above.   For questions or updates, please contact [place] Please consult [email or web address] for contact info under Cardiology/STEMI.      Signed, Skeet Latch, MD  12/24/2016, 8:32 AM

## 2016-12-24 NOTE — Progress Notes (Signed)
[name] [facility]  PROGRESS NOTE    [name]  NID:[id number] DOB: 1945-12-02 DOA: 12/22/2016 PCP: [name]    Brief Narrative:  71 year old male who presented with dyspnea and chest pain. Patient does have the significant past medical history of end-stage renal disease on hemodialysis (MWF), hypertension, dyslipidemia, type 2 diabetes mellitus, CVA, GERD, coronary disease, and diastolic heart failure. Patient developed sudden and severe dyspnea, associated with cough and frothy white sputum. Positive precordial pressure-like chest pain. On his initial physical examination his blood pressure was 166/111 and oxygen saturation 82%, he was placed on BiPAP and received labetalol intravenously with improvement of his symptoms. Heart S1-S2 present, rhythmic, rales, rubs or murmurs, no JVD, his lungs had rales bilaterally with wheezing and rhonchi, his abdomen was soft and nontender, nondistended, no significant lower extremity edema. Sodium 138, potassium 3.9, chloride 101, bicarbonate 26, glucose 150, BUN 40, creatinine 8.83, white count 6.8, hemoglobin 11.6, hematocrit 34.4, platelets 151. Chest x-ray was rotated to the right, increase in interstitial markings bilaterally, fluid in the fissure on the right side with no pleural effusions or cardiomegaly. EKG sinus rhythm, normal axis, normal intervals, positive left ventricular hypertrophy, lateral T-wave inversions.  Patient was admitted to the hospital with working diagnosis of acute hypoxic respiratory failure due to pulmonary edema related to uncontrolled hypertension and volume overload. Patient underwent dialysis with ultrafiltration with further improvement of his volume overload and respiratory failure. Follow-up echocardiogram show reduction in systolic LV function, patient underwent cardiac catheterization, showing mid LAD lesion 70% stenosis, with systolic ejection fraction less then 25%  Assessment & Plan:   Principal Problem:   Flash pulmonary  edema ([name]) Active Problems:   Essential hypertension   CAD (coronary artery disease)   Cerebral infarction ([name])   GERD (gastroesophageal reflux disease)   Diabetes mellitus type 2, controlled, with complications ([place])   ESRD (end stage renal disease) ([place])   Anemia due to chronic kidney disease   Acute respiratory failure with hypoxia ([name])   Acute on chronic combined systolic and diastolic CHF (congestive heart failure) ([name])   Elevated troponin I level   HLD (hyperlipidemia)   1. Acute hypoxic respiratory failure due to pulmonary edema due to volume overload. Volume has improved after ultrafiltration, dyspnea has resolved, scheduled HD for today. Oxymetry 100% on room air.   2. Systolic heart failure acute on chronic. Worsening systolic LV function with significant coronary artery disease, follow on cardiovascular and thoracic surgery. For now will continue aggressive medical therapy. 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 Will continue with carvedilol, lisinopril.   3. CAD. Currently chest pain free, continue medical therapy, follow cardiology and thoracic surgery recommendations. Holding on clopidogrel for now.   4. ESRD on HD. Patient scheduled for HD this pm, no signs of uremia or volume overload. Continue calcium acetate.   5. HTN. Blood pressure with improved control, will continue   6. T2dm. Continue diet controlled, patient tolerating po well. No nausea or vomiting. Capillary glucose 125, 91.    DVT prophylaxis: heparin  Code Status: full Family Communication:  Disposition Plan: home   Consultants:   Nephrology  Cardiology  Procedures:   Cardiac catheterization   Antimicrobials:       Subjective: Patient feeling well, no nausea or vomiting, no fever or chills. Dyspnea has improved, no chest pain.   Objective: Vitals:   12/23/16 1930 12/24/16 0047 12/24/16 0332 12/24/16 0500  BP: 91/71 117/80 124/83   Pulse: 91 83 82  Resp:  12 14 12    Temp: 98 F (36.7 C) 97.8 F (36.6 C) 97.8 F (36.6 C)   TempSrc: Oral Oral Oral   SpO2: 97% 100% 100%   Weight:    57.2 kg (126 lb 1.7 oz)  Height:        Intake/Output Summary (Last 24 hours) at 12/24/16 0735 Last data filed at 12/23/16 2119  Gross per 24 hour  Intake              620 ml  Output                0 ml  Net              620 ml   Filed Weights   12/22/16 1017 12/23/16 0500 12/24/16 0500  Weight: 55.9 kg (123 lb 3.8 oz) 57 kg (125 lb 9.6 oz) 57.2 kg (126 lb 1.7 oz)    Examination:  General: deconditioned, not in pain or dyspnea.  Neurology: Awake and alert, non focal  E ENT: mild pallor, no icterus, oral mucosa moist Cardiovascular: No JVD. S1-S2 present, rhythmic, no gallops, rubs, or murmurs. No lower extremity edema. Pulmonary: vesicular breath sounds bilaterally, adequate air movement, no wheezing, rhonchi. Mild rales at bases. Gastrointestinal. Abdomen flat, no organomegaly, non tender, no rebound or guarding Skin. No rashes Musculoskeletal: no joint deformities     Data Reviewed: I have personally reviewed following labs and imaging studies  CBC:  Recent Labs Lab 12/22/16 0052 12/23/16 0336 12/24/16 0347  WBC 6.8 3.1* 4.1  NEUTROABS 4.5  --  2.1  HGB 11.6* 11.8* 11.5*  HCT 34.4* 34.6* 34.0*  MCV 90.8 88.0 87.6  PLT 151 150 831   Basic Metabolic Panel:  Recent Labs Lab 12/22/16 0052 12/23/16 0336 12/24/16 0347  NA 138 133* 133*  K 3.9 4.0 4.4  CL 101 93* 93*  CO2 26 27 26   GLUCOSE 150* 122* 100*  BUN 40* 28* 41*  CREATININE 8.83* 7.20* 9.11*  CALCIUM 8.6* 8.5* 8.4*   GFR: Estimated Creatinine Clearance: 6.1 mL/min (A) (by C-G formula based on SCr of 9.11 mg/dL (H)). Liver Function Tests:  Recent Labs Lab 12/22/16 0052 12/24/16 0347  AST 12* 10*  ALT 10* 9*  ALKPHOS 102 86  BILITOT 0.5 0.5  PROT 7.7 6.8  ALBUMIN 3.7 3.1*    Recent Labs Lab 12/22/16 0052  LIPASE 66*   No results for input(s): AMMONIA  in the last 168 hours. Coagulation Profile:  Recent Labs Lab 12/23/16 0336  INR 0.99   Cardiac Enzymes:  Recent Labs Lab 12/22/16 0053 12/22/16 0655 12/22/16 1225 12/22/16 1819  TROPONINI 0.11* 0.10* 0.09* 0.07*   BNP (last 3 results)  Recent Labs  07/09/16 1438 07/31/16 1104 08/19/16 1155  PROBNP 1,044.0* 343.0* 349.0*   HbA1C: No results for input(s): HGBA1C in the last 72 hours. CBG:  Recent Labs Lab 12/22/16 0430 12/23/16 0917  GLUCAP 130* 125*   Lipid Profile: No results for input(s): CHOL, HDL, LDLCALC, TRIG, CHOLHDL, LDLDIRECT in the last 72 hours. Thyroid Function Tests: No results for input(s): TSH, T4TOTAL, FREET4, T3FREE, THYROIDAB in the last 72 hours. Anemia Panel: No results for input(s): VITAMINB12, FOLATE, FERRITIN, TIBC, IRON, RETICCTPCT in the last 72 hours.    Radiology Studies: I have reviewed all of the imaging during this hospital visit personally     Scheduled Meds: . calcium acetate  667 mg Oral Q breakfast  . carvedilol  3.125 mg Oral BID WC  .  famotidine  20 mg Oral Daily  . gabapentin  300 mg Oral QHS  . heparin  5,000 Units Subcutaneous Q8H  . lisinopril  5 mg Oral Daily  . loratadine  10 mg Oral Daily  . multivitamin  1 tablet Oral Daily  . nicotine  21 mg Transdermal Daily  . pneumococcal 23 valent vaccine  0.5 mL Intramuscular Tomorrow-1000  . rosuvastatin  40 mg Oral Daily  . sodium chloride flush  3 mL Intravenous Q12H  . venlafaxine XR  37.5 mg Oral Q breakfast   Continuous Infusions: . sodium chloride       LOS: 2 days        Quinnie Barcelo Gerome Apley, MD [facility] Pager [phone]

## 2016-12-25 ENCOUNTER — Encounter (HOSPITAL_COMMUNITY): Payer: Medicare Other

## 2016-12-25 DIAGNOSIS — I5021 Acute systolic (congestive) heart failure: Secondary | ICD-10-CM

## 2016-12-25 DIAGNOSIS — E785 Hyperlipidemia, unspecified: Secondary | ICD-10-CM

## 2016-12-25 LAB — RENAL FUNCTION PANEL
ALBUMIN: 3 g/dL — AB (ref 3.5–5.0)
Anion gap: 13 (ref 5–15)
BUN: 53 mg/dL — AB (ref 6–20)
CO2: 25 mmol/L (ref 22–32)
CREATININE: 10.22 mg/dL — AB (ref 0.61–1.24)
Calcium: 8 mg/dL — ABNORMAL LOW (ref 8.9–10.3)
Chloride: 92 mmol/L — ABNORMAL LOW (ref 101–111)
GFR calc Af Amer: 5 mL/min — ABNORMAL LOW (ref >60)
GFR, EST NON AFRICAN AMERICAN: 4 mL/min — AB (ref >60)
Glucose, Bld: 179 mg/dL — ABNORMAL HIGH (ref 65–99)
PHOSPHORUS: 5.9 mg/dL — AB (ref 2.5–4.6)
Potassium: 4.4 mmol/L (ref 3.5–5.1)
SODIUM: 130 mmol/L — AB (ref 135–145)

## 2016-12-25 LAB — CBC
HEMATOCRIT: 30.5 % — AB (ref 39.0–52.0)
Hemoglobin: 10.4 g/dL — ABNORMAL LOW (ref 13.0–17.0)
MCH: 29.8 pg (ref 26.0–34.0)
MCHC: 34.1 g/dL (ref 30.0–36.0)
MCV: 87.4 fL (ref 78.0–100.0)
PLATELETS: 158 10*3/uL (ref 150–400)
RBC: 3.49 MIL/uL — ABNORMAL LOW (ref 4.22–5.81)
RDW: 16.7 % — AB (ref 11.5–15.5)
WBC: 3.9 10*3/uL — AB (ref 4.0–10.5)

## 2016-12-25 LAB — GLUCOSE, CAPILLARY: GLUCOSE-CAPILLARY: 83 mg/dL (ref 65–99)

## 2016-12-25 MED ORDER — LISINOPRIL 5 MG PO TABS: 5.0000 mg | ORAL_TABLET | Freq: Every day | ORAL | 0 refills | Status: DC

## 2016-12-25 NOTE — Discharge Summary (Signed)
Physician Discharge Summary  [name] PXT:[id number] DOB: 1946-03-04 DOA: 12/22/2016  PCP: Mackie Pai, PA-C  Admit date: 12/22/2016 Discharge date: 12/25/2016  Admitted From: Home Disposition:  Home  Recommendations for Outpatient Follow-up:  1. Follow up with PCP in 1- week 2. Amlodipine has been discontinued and patient has been placed on Lisinopril 3. No antiplatelet therapy due to history of gastrointestinal bleeding.   Home Health: No Equipment/Devices: No  Discharge Condition: Stable CODE STATUS: full  Diet recommendation: Renal, cardiac and diabetic prudent  Brief/Interim Summary: 71 year old male who presented with dyspnea and chest pain. Patient does have the significant past medical history of end-stage renal disease on hemodialysis (MWF), hypertension, dyslipidemia, type 2 diabetes mellitus, CVA, GERD, coronary disease, and diastolic heart failure. Patient developed sudden and severe dyspnea, associated with cough and frothy white sputum. Positive precordial pressure-like chest pain. On his initial physical examination his blood pressure was 166/111 and oxygen saturation 82%, he was placed on BiPAP and received labetalol intravenously with improvement of his symptoms. Heart S1-S2 present, rhythmic, no gallops, rubs or murmurs, no JVD, his lungs had rales bilaterally with wheezing and rhonchi, his abdomen was soft and nontender, nondistended, no significant lower extremity edema. Sodium 138, potassium 3.9, chloride 101, bicarbonate 26, glucose 150, BUN 40, creatinine 8.83, white count 6.8, hemoglobin 11.6, hematocrit 34.4, platelets 151. Chest x-ray was rotated to the right, increase in interstitial markings bilaterally, fluid in the fissure on the right side with no pleural effusions or cardiomegaly. EKG sinus rhythm, normal axis, normal intervals, positive left ventricular hypertrophy, lateral T-wave inversions.  Patient was admitted to the hospital with working diagnosis  of acute hypoxic respiratory failure due to pulmonary edema related to uncontrolled hypertension and volume overload.. Follow-up echocardiogram show reduction in systolic LV function, patient underwent cardiac catheterization, showing mid LAD lesion 70% stenosis, with systolic ejection fraction less then 25%  1. Acute hypoxic respiratory failure due to pulmonary edema due to volume overload. Patient underwent dialysis with ultrafiltration with further improvement of his volume overload and respiratory failure. Patient achieved a negative fluid balance,  4053 mL since admission, and a 22 kg weight loss since admission. His oximetry saturation, reached 99-100% on room air.  2. Worsening systolic heart failure, acute on chronic, combined ischemic and nonischemic cardiomyopathy. Follow-up echocardiography showed increased wall thickness in a pattern of moderate LVH, the left ventricle systolic function was severely reduced down to 20-25% ejection fraction, with diffuse hypokinesis. Patient underwent cardiac catheterization, coronary angiography with mid LAD lesion 70% stenosed, small caliber first diagonal 80% stenosed, and mid RCA lesion 40% stenosis. The significant reduction on LV systolic function was out of proportion to his coronary disease, patient was seen by CT surgery, who recommended continue medical therapy. He may need surgical intervention if his disease progresses. Amlodipine has been changed to lisinopril, patient will continue carvedilol, further fluid management per hemodialysis and ultrafiltration.   3. Coronary artery disease. Will continue aggressive medical therapy with beta blockade and ACE inhibitors, as well as rosuvastatin. At this point will continue to hold on antiplatelet therapy due to risk of recurrent gastrointestinal bleed. Will need a close follow-up with cardiology as outpatient. If patient develops angina, the idea of revascularization will need to be revisited.   4. End-stage  renal disease on hemodialysis. Patient underwent hemodialysis with ultrafiltration with no major complications. He achieved euvolemic state, patient lost about 22 kg since admission, with a discharge weight of 54.6 kg. Continue calcium acetate.  5. Hypertension. Blood  pressure improved after fluid removal, blood pressure remained well-controlled, Amlodipine has been changed to lisinopril to improve heart function.   6. Type 2 diabetes mellitus. Patient will continue diet control, his capillary glucose remained well-controlled.   Discharge Diagnoses:  Principal Problem:   Flash pulmonary edema ([name]) Active Problems:   Essential hypertension   CAD (coronary artery disease)   Cerebral infarction ([name])   GERD (gastroesophageal reflux disease)   Diabetes mellitus type 2, controlled, with complications ([place])   ESRD (end stage renal disease) ([place])   Anemia due to chronic kidney disease   Acute respiratory failure with hypoxia ([name])   Acute on chronic combined systolic and diastolic CHF (congestive heart failure) ([name])   Elevated troponin I level   HLD (hyperlipidemia)    Discharge Instructions   Allergies as of 12/25/2016   No Known Allergies     Medication List    STOP taking these medications   amLODipine 5 MG tablet Commonly known as:  NORVASC     TAKE these medications   Calcium Acetate 668 (169 Ca) MG Tabs Take 1 tablet by mouth daily.   carvedilol 3.125 MG tablet Commonly known as:  COREG TAKE 1 TABLET BY MOUTH TWICE DAILY WITH MEALS   gabapentin 300 MG capsule Commonly known as:  NEURONTIN Take 1 capsule (300 mg total) by mouth at bedtime. What changed:  when to take this   lisinopril 5 MG tablet Commonly known as:  PRINIVIL,ZESTRIL Take 1 tablet (5 mg total) by mouth daily.   multivitamin Tabs tablet Take 1 tablet by mouth daily.   ranitidine 150 MG capsule Commonly known as:  ZANTAC Take 1 capsule (150 mg total) by mouth 2 (two) times daily.    rosuvastatin 40 MG tablet Commonly known as:  CRESTOR TAKE 1 TABLET BY MOUTH EVERY DAY   venlafaxine XR 37.5 MG 24 hr capsule Commonly known as:  EFFEXOR-XR Take 1 capsule (37.5 mg total) by mouth daily with breakfast.      Follow-up Information    Jettie Booze, MD Follow up.   Specialties:  Cardiology, Radiology, Interventional Cardiology Why:  office will call with time and date of appointment. If you did not heard by Friday, please give [place] a call Contact information: [address] [place] [address] [phone]          No Known Allergies  Consultations:  Cardiology  CT surgery  Nephrology   Procedures/Studies: Dg Chest Port 1 View  Result Date: 12/22/2016 CLINICAL DATA:  Acute respiratory distress tonight. EXAM: PORTABLE CHEST 1 VIEW COMPARISON:  10/21/2016 FINDINGS: Diffuse interstitial fluid or thickening, new. No airspace consolidation. There is a trace fluid in the right minor fissure, but no large effusion. Hilar, mediastinal and cardiac contours are unremarkable and unchanged. IMPRESSION: Diffuse interstitial fluid or interstitial infiltrate. Electronically Signed   By: Andreas Newport M.D.   On: 12/22/2016 00:58       Subjective: Patient feeling better, no dyspnea or chest pain, no nausea or vomiting.   Discharge Exam: Vitals:   12/25/16 0557 12/25/16 0747  BP: (!) 99/59 114/74  Pulse: 81 80  Resp: 15 19  Temp: 97.7 F (36.5 C) 97.7 F (36.5 C)  SpO2: 99% 99%   Vitals:   12/25/16 0500 12/25/16 0530 12/25/16 0557 12/25/16 0747  BP: 107/60 (!) 94/56 (!) 99/59 114/74  Pulse: 84 82 81 80  Resp: 12 15 15 19   Temp:   97.7 F (36.5 C) 97.7 F (36.5  C)  TempSrc:   Oral Oral  SpO2: 92% 99% 99% 99%  Weight:   54.6 kg (120 lb 5.9 oz)   Height:        General: Pt is alert, awake, not in acute distress E ENT. Mild pallor., but no icterus, oral mucosa moist.  Cardiovascular: RRR, S1/S2 +, no rubs, no  gallops Respiratory: CTA bilaterally, no wheezing, no rhonchi Abdominal: Soft, NT, ND, bowel sounds + Extremities: no edema, no cyanosis    The results of significant diagnostics from this hospitalization (including imaging, microbiology, ancillary and laboratory) are listed below for reference.     Microbiology: Recent Results (from the past 240 hour(s))  MRSA PCR Screening     Status: None   Collection Time: 12/22/16  4:51 AM  Result Value Ref Range Status   MRSA by PCR NEGATIVE NEGATIVE Final    Comment:        The GeneXpert MRSA Assay (FDA approved for NASAL specimens only), is one component of a comprehensive MRSA colonization surveillance program. It is not intended to diagnose MRSA infection nor to guide or monitor treatment for MRSA infections.      Labs: BNP (last 3 results)  Recent Labs  07/25/16 0430  BNP 7,628.3*   Basic Metabolic Panel:  Recent Labs Lab 12/22/16 0052 12/23/16 0336 12/24/16 0347 12/24/16 2345  NA 138 133* 133* 130*  K 3.9 4.0 4.4 4.4  CL 101 93* 93* 92*  CO2 26 27 26 25   GLUCOSE 150* 122* 100* 179*  BUN 40* 28* 41* 53*  CREATININE 8.83* 7.20* 9.11* 10.22*  CALCIUM 8.6* 8.5* 8.4* 8.0*  PHOS  --   --   --  5.9*   Liver Function Tests:  Recent Labs Lab 12/22/16 0052 12/24/16 0347 12/24/16 2345  AST 12* 10*  --   ALT 10* 9*  --   ALKPHOS 102 86  --   BILITOT 0.5 0.5  --   PROT 7.7 6.8  --   ALBUMIN 3.7 3.1* 3.0*    Recent Labs Lab 12/22/16 0052  LIPASE 66*   No results for input(s): AMMONIA in the last 168 hours. CBC:  Recent Labs Lab 12/22/16 0052 12/23/16 0336 12/24/16 0347 12/24/16 2345  WBC 6.8 3.1* 4.1 3.9*  NEUTROABS 4.5  --  2.1  --   HGB 11.6* 11.8* 11.5* 10.4*  HCT 34.4* 34.6* 34.0* 30.5*  MCV 90.8 88.0 87.6 87.4  PLT 151 150 171 158   Cardiac Enzymes:  Recent Labs Lab 12/22/16 0053 12/22/16 0655 12/22/16 1225 12/22/16 1819  TROPONINI 0.11* 0.10* 0.09* 0.07*   BNP: Invalid input(s):  POCBNP CBG:  Recent Labs Lab 12/22/16 0430 12/23/16 0917 12/24/16 0833 12/25/16 0747  GLUCAP 130* 125* 91 83   D-Dimer No results for input(s): DDIMER in the last 72 hours. Hgb A1c No results for input(s): HGBA1C in the last 72 hours. Lipid Profile No results for input(s): CHOL, HDL, LDLCALC, TRIG, CHOLHDL, LDLDIRECT in the last 72 hours. Thyroid function studies No results for input(s): TSH, T4TOTAL, T3FREE, THYROIDAB in the last 72 hours.  Invalid input(s): FREET3 Anemia work up No results for input(s): VITAMINB12, FOLATE, FERRITIN, TIBC, IRON, RETICCTPCT in the last 72 hours. Urinalysis    Component Value Date/Time   COLORURINE YELLOW 04/01/2016 0939   APPEARANCEUR CLEAR 04/01/2016 0939   LABSPEC >=1.030 (A) 04/01/2016 0939   PHURINE 5.5 04/01/2016 0939   GLUCOSEU NEGATIVE 04/01/2016 0939   HGBUR MODERATE (A) 04/01/2016 0939   BILIRUBINUR SMALL (A)  04/01/2016 0939   BILIRUBINUR small 06/21/2012 1041   KETONESUR TRACE (A) 04/01/2016 0939   PROTEINUR >300 (A) 12/25/2015 1028   UROBILINOGEN 0.2 04/01/2016 0939   NITRITE NEGATIVE 04/01/2016 0939   LEUKOCYTESUR NEGATIVE 04/01/2016 0939   Sepsis Labs Invalid input(s): PROCALCITONIN,  WBC,  LACTICIDVEN Microbiology Recent Results (from the past 240 hour(s))  MRSA PCR Screening     Status: None   Collection Time: 12/22/16  4:51 AM  Result Value Ref Range Status   MRSA by PCR NEGATIVE NEGATIVE Final    Comment:        The GeneXpert MRSA Assay (FDA approved for NASAL specimens only), is one component of a comprehensive MRSA colonization surveillance program. It is not intended to diagnose MRSA infection nor to guide or monitor treatment for MRSA infections.      Time coordinating discharge: 45 minutes  SIGNED:   Tawni Millers, MD  [facility] 12/25/2016, 8:01 AM Pager [phone]  If 7PM-7AM, please contact night-coverage [email or web address] Password [email or web address]

## 2016-12-25 NOTE — Progress Notes (Signed)
Progress Note  Patient Name: [name] Date of Encounter: 12/25/2016  Primary Cardiologist: Dr. Irish Lack  Subjective   Feeling well. Eager to get home.  Denies chest pain, shortness of breath or lightheadedness.   Inpatient Medications    Scheduled Meds: . calcium acetate  667 mg Oral Q breakfast  . carvedilol  3.125 mg Oral BID WC  . famotidine  20 mg Oral Daily  . gabapentin  300 mg Oral QHS  . heparin  5,000 Units Subcutaneous Q8H  . lisinopril  5 mg Oral Daily  . loratadine  10 mg Oral Daily  . multivitamin  1 tablet Oral Daily  . nicotine  21 mg Transdermal Daily  . pneumococcal 23 valent vaccine  0.5 mL Intramuscular Tomorrow-1000  . rosuvastatin  40 mg Oral Daily  . sodium chloride flush  3 mL Intravenous Q12H  . venlafaxine XR  37.5 mg Oral Q breakfast   Continuous Infusions: . sodium chloride    . sodium chloride    . sodium chloride     PRN Meds: sodium chloride, sodium chloride, sodium chloride, acetaminophen, alteplase, heparin, lidocaine (PF), lidocaine-prilocaine, morphine injection, nitroGLYCERIN, ondansetron (ZOFRAN) IV, pentafluoroprop-tetrafluoroeth, sodium chloride flush, zolpidem   Vital Signs    Vitals:   12/25/16 0500 12/25/16 0530 12/25/16 0557 12/25/16 0747  BP: 107/60 (!) 94/56 (!) 99/59 114/74  Pulse: 84 82 81 80  Resp: 12 15 15 19   Temp:   97.7 F (36.5 C) 97.7 F (36.5 C)  TempSrc:   Oral Oral  SpO2: 92% 99% 99% 99%  Weight:   54.6 kg (120 lb 5.9 oz)   Height:        Intake/Output Summary (Last 24 hours) at 12/25/16 0815 Last data filed at 12/25/16 0530  Gross per 24 hour  Intake              846 ml  Output             2306 ml  Net            -1460 ml   Filed Weights   12/24/16 0500 12/25/16 0140 12/25/16 0557  Weight: 57.2 kg (126 lb 1.7 oz) 56.9 kg (125 lb 7.1 oz) 54.6 kg (120 lb 5.9 oz)    Telemetry    Sinus rhythm.  Occasional PVCs.  - Personally Reviewed  ECG    n/a - Personally Reviewed  Physical Exam    VS:  BP 114/74 (BP Location: Right Arm)   Pulse 80   Temp 97.7 F (36.5 C) (Oral)   Resp 19   Ht 5\' 7"  (1.702 m)   Wt 54.6 kg (120 lb 5.9 oz)   SpO2 99%   BMI 18.85 kg/m  , BMI Body mass index is 18.85 kg/m. GENERAL:  Well appearing. No acute distress.  HEENT: Pupils equal round and reactive, fundi not visualized, oral mucosa unremarkable NECK:  No jugular venous distention, waveform within normal limits, carotid upstroke brisk and symmetric, no bruits, no thyromegaly LUNGS:  Clear to auscultation bilaterally HEART:  RRR.  PMI not displaced or sustained,S1 and S2 within normal limits, no S3, no S4, no clicks, no rubs, no murmurs ABD:  Flat, positive bowel sounds normal in frequency in pitch, no bruits, no rebound, no guarding, no midline pulsatile mass, no hepatomegaly, no splenomegaly EXT:  2 plus pulses throughout, no edema, no cyanosis no clubbing SKIN:  No rashes no nodules NEURO:  Cranial nerves II through XII grossly intact, motor grossly  intact throughout [facility]:  Cognitively intact, oriented to person place and time   Labs    Chemistry  Recent Labs Lab 12/22/16 0052 12/23/16 0336 12/24/16 0347 12/24/16 2345  NA 138 133* 133* 130*  K 3.9 4.0 4.4 4.4  CL 101 93* 93* 92*  CO2 26 27 26 25   GLUCOSE 150* 122* 100* 179*  BUN 40* 28* 41* 53*  CREATININE 8.83* 7.20* 9.11* 10.22*  CALCIUM 8.6* 8.5* 8.4* 8.0*  PROT 7.7  --  6.8  --   ALBUMIN 3.7  --  3.1* 3.0*  AST 12*  --  10*  --   ALT 10*  --  9*  --   ALKPHOS 102  --  86  --   BILITOT 0.5  --  0.5  --   GFRNONAA 5* 7* 5* 4*  GFRAA 6* 8* 6* 5*  ANIONGAP 11 13 14 13      Hematology  Recent Labs Lab 12/23/16 0336 12/24/16 0347 12/24/16 2345  WBC 3.1* 4.1 3.9*  RBC 3.93* 3.88* 3.49*  HGB 11.8* 11.5* 10.4*  HCT 34.6* 34.0* 30.5*  MCV 88.0 87.6 87.4  MCH 30.0 29.6 29.8  MCHC 34.1 33.8 34.1  RDW 16.1* 16.2* 16.7*  PLT 150 171 158    Cardiac Enzymes  Recent Labs Lab 12/22/16 0053 12/22/16 0655  12/22/16 1225 12/22/16 1819  TROPONINI 0.11* 0.10* 0.09* 0.07*   No results for input(s): TROPIPOC in the last 168 hours.   BNPNo results for input(s): BNP, PROBNP in the last 168 hours.   DDimer No results for input(s): DDIMER in the last 168 hours.   Radiology    No results found.  Cardiac Studies   Echo 12/23/16: Study Conclusions  - Left ventricle: The cavity size was normal. Wall thickness was   increased in a pattern of moderate LVH. Systolic function was   severely reduced. The estimated ejection fraction was in the   range of 20% to 25%. Diffuse hypokinesis. Doppler parameters are   consistent with abnormal left ventricular relaxation (grade 1   diastolic dysfunction). Doppler parameters are consistent with   high ventricular filling pressure.  Impressions:  - Severe global reduction in LV systolic function; mild diastolic   dysfunction; moderate LVH; trace MR and TR.   Patient Profile     Mr. Every is a 34M with medically managed CAD, chronic systolic and diastolic heart failure, ESRD on HD, prior CVA, recurrent GI bleed, hypertension, and hyperlipidemia here with flash pulmonary edema and acute hypoxic respiratory failure.    Assessment & Plan    # Acute on chronic systolic and diastolic heart failure:  # Elevated troponin: # Obstructive CAD:  LVEF reduced to 20% from 45-505 06/2016.  Amlodipine was discontinued and he was started on lisinopril.  Continue carvedilol.  He has a 70% mid LAD lesion that is slightly worse than his prior cath.  FFR barely positive at 0.8.  He isn't a good candidate for PCI given his recurrent GI blood from AVMs.  Given that his LV dysfunction is out of proportion to his CAD and he has no angina, he will not undergo CABG.  If symptoms worsen this could be considered, though he is a high risk candidate.  # Hypoxic respiratory failure: # Flash pulmonary edema: Stable.  LVEDP 15.  Volume management with HD.  Stable for discharge.  We  will arrange follow up.     For questions or updates, please contact [place] Please consult [email or web address] for contact  info under Cardiology/STEMI.      Signed, Skeet Latch, MD  12/25/2016, 8:15 AM

## 2016-12-25 NOTE — Progress Notes (Signed)
Pt on room air resting comfortably. BIPAP available when and if pt needs it. Rt will cont to monitor.

## 2016-12-25 NOTE — Progress Notes (Signed)
Patient discharged home with wife, IV's removed and paperwork/patient belongings taken to vehicle.

## 2016-12-25 NOTE — Progress Notes (Signed)
[facility] ROUNDING NOTE   Subjective:   Interval History: 71 y.o.malewith ESRD, CAD, Type 2 DM, prostate cancer, Hx CVA, and Hx GI bleed (due to AVMs), combined HF who was admitted this am with SOB and pulm edema. Cardiac catheterization 10/9  70% mid LAD lesion that is slightly worse than his prior cath.  Appreciate assistance from Dr Roxan Hockey and will continue medical management , some low blood pressures are concerning and feel it prudent to stop to ACE inhibitor at this point as this will preclude the removal of fluid with dialysis  Objective:  Vital signs in last 24 hours:  Temp:  [97.7 F (36.5 C)-97.9 F (36.6 C)] 97.7 F (36.5 C) (10/11 0747) Pulse Rate:  [80-150] 80 (10/11 0747) Resp:  [10-19] 19 (10/11 0747) BP: (76-129)/(46-82) 114/74 (10/11 0747) SpO2:  [92 %-100 %] 99 % (10/11 0747) Weight:  [120 lb 5.9 oz (54.6 kg)-125 lb 7.1 oz (56.9 kg)] 120 lb 5.9 oz (54.6 kg) (10/11 0557)  Weight change: -10.6 oz (-0.3 kg) Filed Weights   12/24/16 0500 12/25/16 0140 12/25/16 0557  Weight: 126 lb 1.7 oz (57.2 kg) 125 lb 7.1 oz (56.9 kg) 120 lb 5.9 oz (54.6 kg)    Intake/Output: I/O last 3 completed shifts: In: 9983 [P.O.:1320; I.V.:16] Out: 2306 [Other:2306]   Intake/Output this shift:  No intake/output data recorded.  CVS- RRR RS- CTA ABD- BS present soft non-distended EXT- no edema   Basic Metabolic Panel:  Recent Labs Lab 12/22/16 0052 12/23/16 0336 12/24/16 0347 12/24/16 2345  NA 138 133* 133* 130*  K 3.9 4.0 4.4 4.4  CL 101 93* 93* 92*  CO2 26 27 26 25   GLUCOSE 150* 122* 100* 179*  BUN 40* 28* 41* 53*  CREATININE 8.83* 7.20* 9.11* 10.22*  CALCIUM 8.6* 8.5* 8.4* 8.0*  PHOS  --   --   --  5.9*    Liver Function Tests:  Recent Labs Lab 12/22/16 0052 12/24/16 0347 12/24/16 2345  AST 12* 10*  --   ALT 10* 9*  --   ALKPHOS 102 86  --   BILITOT 0.5 0.5  --   PROT 7.7 6.8  --   ALBUMIN 3.7 3.1* 3.0*    Recent Labs Lab  12/22/16 0052  LIPASE 66*   No results for input(s): AMMONIA in the last 168 hours.  CBC:  Recent Labs Lab 12/22/16 0052 12/23/16 0336 12/24/16 0347 12/24/16 2345  WBC 6.8 3.1* 4.1 3.9*  NEUTROABS 4.5  --  2.1  --   HGB 11.6* 11.8* 11.5* 10.4*  HCT 34.4* 34.6* 34.0* 30.5*  MCV 90.8 88.0 87.6 87.4  PLT 151 150 171 158    Cardiac Enzymes:  Recent Labs Lab 12/22/16 0053 12/22/16 0655 12/22/16 1225 12/22/16 1819  TROPONINI 0.11* 0.10* 0.09* 0.07*    BNP: Invalid input(s): POCBNP  CBG:  Recent Labs Lab 12/22/16 0430 12/23/16 0917 12/24/16 0833 12/25/16 0747  GLUCAP 130* 125* 91 2    Microbiology: Results for orders placed or performed during the hospital encounter of 12/22/16  MRSA PCR Screening     Status: None   Collection Time: 12/22/16  4:51 AM  Result Value Ref Range Status   MRSA by PCR NEGATIVE NEGATIVE Final    Comment:        The GeneXpert MRSA Assay (FDA approved for NASAL specimens only), is one component of a comprehensive MRSA colonization surveillance program. It is not intended to diagnose MRSA infection nor to guide or monitor  treatment for MRSA infections.     Coagulation Studies:  Recent Labs  12/23/16 0336  LABPROT 13.0  INR 0.99    Urinalysis: No results for input(s): COLORURINE, LABSPEC, PHURINE, GLUCOSEU, HGBUR, BILIRUBINUR, KETONESUR, PROTEINUR, UROBILINOGEN, NITRITE, LEUKOCYTESUR in the last 72 hours.  Invalid input(s): APPERANCEUR    Imaging: No results found.   Medications:   . sodium chloride    . sodium chloride    . sodium chloride     . calcium acetate  667 mg Oral Q breakfast  . carvedilol  3.125 mg Oral BID WC  . famotidine  20 mg Oral Daily  . gabapentin  300 mg Oral QHS  . heparin  5,000 Units Subcutaneous Q8H  . loratadine  10 mg Oral Daily  . multivitamin  1 tablet Oral Daily  . nicotine  21 mg Transdermal Daily  . pneumococcal 23 valent vaccine  0.5 mL Intramuscular Tomorrow-1000  .  rosuvastatin  40 mg Oral Daily  . sodium chloride flush  3 mL Intravenous Q12H  . venlafaxine XR  37.5 mg Oral Q breakfast   sodium chloride, sodium chloride, sodium chloride, acetaminophen, alteplase, heparin, lidocaine (PF), lidocaine-prilocaine, morphine injection, nitroGLYCERIN, ondansetron (ZOFRAN) IV, pentafluoroprop-tetrafluoroeth, sodium chloride flush, zolpidem  Assessment/ Plan:  1. Acute pulm edema/ SOB - 2 -D echo shows worsening LVEF. 20-25 percent with diffuse hypokinesis Cardiology consulted and plan LHC on 10/9 to further assess. Lisinopril started. 70% mid LAD lesion that is slightly worse than his prior cath appreciate Dr Oval Linsey . Not thought to be a candidate for CABG  2. ESRD on HD MWF , [facility] 3. HTN - cont meds  Stop the lisinopril due to low BP  4. Hx CVA 5. Anemia of CKD - Hb 11.6 no ESA     LOS: 3 Jailine Lieder W @TODAY @8 :50 AM

## 2016-12-25 NOTE — Discharge Instructions (Signed)

## 2016-12-25 NOTE — Progress Notes (Signed)
HD tx ended 20 min early @ 0530 d/t arterial site not working, UF goal still met, blood rinsed back, VSS, report called to Sylvan Cheese, RN

## 2016-12-25 NOTE — Progress Notes (Signed)
Pt dressed for home, off monitor. Wife present and I discussed HF, daily wts, low sodium diet, walking daily and smoking cessation. Pt voiced understanding, sts he is going to try to quit smoking. I gave him a fake cigarette. He is very anxious to d/c so distracted at times. Encouraged him to continue discussing diet with RD at HD.  [id number] Yves Dill CES, ACSM 10:21 AM 12/25/2016

## 2016-12-25 NOTE — Progress Notes (Signed)
HD tx initiated via 15Gx2 w/o problem, pull/push/flush equally w/o problem, VSS, will cont to monitor while on HD tx 

## 2016-12-26 ENCOUNTER — Telehealth: Payer: Self-pay | Admitting: *Deleted

## 2016-12-26 LAB — HEPATITIS B SURFACE ANTIBODY,QUALITATIVE: Hep B S Ab: NONREACTIVE

## 2016-12-26 NOTE — Telephone Encounter (Signed)
Received Physician Orders from [facility];, forwarded to provider/SLS 10/12

## 2016-12-29 ENCOUNTER — Telehealth: Payer: Self-pay | Admitting: Behavioral Health

## 2016-12-29 NOTE — Telephone Encounter (Signed)
Unable to reach patient at time of TCM call. Left message for patient to return call when available.   

## 2016-12-30 NOTE — Telephone Encounter (Signed)
Left message x 2 for TCM/Hospital Follow-up call. 

## 2016-12-31 NOTE — Telephone Encounter (Signed)
Transition Care Management Follow-up Telephone Call  PCP: Elise Benne  Admit date: 12/22/2016 Discharge date: 12/25/2016  Admitted From: Home Disposition:  Home  Recommendations for Outpatient Follow-up:  1. Follow up with PCP in 1- week 2. Amlodipine has been discontinued and patient has been placed on Lisinopril 3. No antiplatelet therapy due to history of gastrointestinal bleeding.   Home Health: No Equipment/Devices: No  Discharge Condition: Stable   How have you been since you were released from the hospital? Patient stated, "I've been alright".   Do you understand why you were in the hospital? yes   Do you understand the discharge instructions? yes   Where were you discharged to? Home   Items Reviewed:  Medications reviewed: yes  Allergies reviewed: yes, NKA  Dietary changes reviewed: yes, renal/diabetic diet  Referrals reviewed: yes, Follow up with PCP in 1- week   Functional Questionnaire:   Activities of Daily Living (ADLs):   He states they are independent in the following: ambulation, bathing and hygiene, feeding, continence, grooming, toileting and dressing States they require assistance with the following: None   Any transportation issues/concerns?: yes, patient voiced that he was advised not to drive anymore; he will have his niece transport him to the appointment.   Any patient concerns? no   Confirmed importance and date/time of follow-up visits scheduled yes, 01/01/17 at 1:30 PM.  Provider Appointment booked with Mackie Pai, PA-C.  Confirmed with patient if condition begins to worsen call PCP or go to the ER.  Patient was given the office number and encouraged to call back with question or concerns.  : yes

## 2017-01-01 ENCOUNTER — Inpatient Hospital Stay: Payer: Medicare Other | Admitting: Medical

## 2017-01-01 DIAGNOSIS — Z0289 Encounter for other administrative examinations: Secondary | ICD-10-CM

## 2017-01-06 ENCOUNTER — Other Ambulatory Visit (INDEPENDENT_AMBULATORY_CARE_PROVIDER_SITE_OTHER): Payer: Medicare Other

## 2017-01-06 ENCOUNTER — Encounter: Payer: Self-pay | Admitting: Medical

## 2017-01-06 ENCOUNTER — Telehealth: Payer: Self-pay | Admitting: *Deleted

## 2017-01-06 ENCOUNTER — Ambulatory Visit (INDEPENDENT_AMBULATORY_CARE_PROVIDER_SITE_OTHER): Payer: Medicare Other | Admitting: Medical

## 2017-01-06 VITALS — BP 100/72 | HR 103 | Temp 97.8°F | Resp 16 | Ht 67.0 in | Wt 127.4 lb

## 2017-01-06 DIAGNOSIS — R5383 Other fatigue: Secondary | ICD-10-CM | POA: Diagnosis not present

## 2017-01-06 DIAGNOSIS — I639 Cerebral infarction, unspecified: Secondary | ICD-10-CM

## 2017-01-06 DIAGNOSIS — N186 End stage renal disease: Secondary | ICD-10-CM | POA: Diagnosis not present

## 2017-01-06 DIAGNOSIS — R7309 Other abnormal glucose: Secondary | ICD-10-CM | POA: Diagnosis not present

## 2017-01-06 DIAGNOSIS — E785 Hyperlipidemia, unspecified: Secondary | ICD-10-CM

## 2017-01-06 DIAGNOSIS — I1311 Hypertensive heart and chronic kidney disease without heart failure, with stage 5 chronic kidney disease, or end stage renal disease: Secondary | ICD-10-CM

## 2017-01-06 LAB — COMPREHENSIVE METABOLIC PANEL
ALK PHOS: 80 U/L (ref 39–117)
ALT: 9 U/L (ref 0–53)
AST: 7 U/L (ref 0–37)
Albumin: 4 g/dL (ref 3.5–5.2)
BUN: 31 mg/dL — AB (ref 6–23)
CO2: 30 meq/L (ref 19–32)
Calcium: 9.6 mg/dL (ref 8.4–10.5)
Chloride: 96 mEq/L (ref 96–112)
Creatinine, Ser: 9.82 mg/dL (ref 0.40–1.50)
GFR: 6.8 mL/min — AB (ref >60.00)
GLUCOSE: 194 mg/dL — AB (ref 70–99)
Potassium: 4.7 mEq/L (ref 3.5–5.1)
SODIUM: 136 meq/L (ref 135–145)
TOTAL PROTEIN: 7.8 g/dL (ref 6.0–8.3)
Total Bilirubin: 0.3 mg/dL (ref 0.2–1.2)

## 2017-01-06 LAB — CBC WITH DIFFERENTIAL/PLATELET
BASOS PCT: 1.4 % (ref 0.0–3.0)
Basophils Absolute: 0.1 10*3/uL (ref 0.0–0.1)
EOS PCT: 3.8 % (ref 0.0–5.0)
Eosinophils Absolute: 0.2 10*3/uL (ref 0.0–0.7)
HEMATOCRIT: 36.9 % — AB (ref 39.0–52.0)
Hemoglobin: 12.1 g/dL — ABNORMAL LOW (ref 13.0–17.0)
LYMPHS ABS: 0.7 10*3/uL (ref 0.7–4.0)
LYMPHS PCT: 16.1 % (ref 12.0–46.0)
MCHC: 32.7 g/dL (ref 30.0–36.0)
MCV: 95.7 fl (ref 78.0–100.0)
MONOS PCT: 15.3 % — AB (ref 3.0–12.0)
Monocytes Absolute: 0.6 10*3/uL (ref 0.1–1.0)
NEUTROS ABS: 2.6 10*3/uL (ref 1.4–7.7)
Neutrophils Relative %: 63.4 % (ref 43.0–77.0)
PLATELETS: 241 10*3/uL (ref 150.0–400.0)
RBC: 3.86 Mil/uL — ABNORMAL LOW (ref 4.22–5.81)
RDW: 19.8 % — ABNORMAL HIGH (ref 11.5–15.5)
WBC: 4.1 10*3/uL (ref 4.0–10.5)

## 2017-01-06 LAB — LIPID PANEL
CHOL/HDL RATIO: 6
Cholesterol: 184 mg/dL (ref 0–200)
HDL: 33.2 mg/dL — ABNORMAL LOW (ref >39.00)
LDL Cholesterol: 113 mg/dL — ABNORMAL HIGH (ref 0–99)
NONHDL: 150.95
Triglycerides: 191 mg/dL — ABNORMAL HIGH (ref 0.0–149.0)
VLDL: 38.2 mg/dL (ref 0.0–40.0)

## 2017-01-06 LAB — HEMOGLOBIN A1C: Hgb A1c MFr Bld: 6.2 % (ref 4.6–6.5)

## 2017-01-06 MED ORDER — RANITIDINE HCL 150 MG PO CAPS: 150.0000 mg | ORAL_CAPSULE | Freq: Two times a day (BID) | ORAL | 1 refills | Status: DC

## 2017-01-06 MED ORDER — VENLAFAXINE HCL ER 75 MG PO CP24: 75.0000 mg | ORAL_CAPSULE | Freq: Every day | ORAL | 3 refills | Status: DC

## 2017-01-06 MED ORDER — LISINOPRIL 5 MG PO TABS: 5.0000 mg | ORAL_TABLET | Freq: Every day | ORAL | 1 refills | Status: DC

## 2017-01-06 MED ORDER — CARVEDILOL 3.125 MG PO TABS: 3.1250 mg | ORAL_TABLET | Freq: Two times a day (BID) | ORAL | 1 refills | Status: DC

## 2017-01-06 MED ORDER — ROSUVASTATIN CALCIUM 40 MG PO TABS: 40.0000 mg | ORAL_TABLET | Freq: Every day | ORAL | 1 refills | Status: DC

## 2017-01-06 NOTE — Progress Notes (Signed)
Subjective:    Patient ID: [name], male    DOB: 03-20-1945, 71 y.o.   MRN: [id number]  HPI  Pt admitted to hospital after sudden and severe dyspnea with chest pain. He had cough as well with some chest pain. His bp were very high intially and his O2 sat was low. He was placed on bipap and received labetolol. He was admitted with dx of acute hypoxic respiratory failure due to pulmonary edema, uncontrolled htn and volume over-load.   Pt has end stage renal failure. He is on dialysis. Pt had cardilogist evaluation in the hospital. He has LAD stenosis of 70%. Reduced ejection fraction of 25%.   Pt has some chronic fatigue. Some chronic anemia. With small bowel AVM history.    Pt mood was decreased in past  and I place him on effexor. He states it did help and he thinks would benefit more from higher.  Pt had flu vaccine at dialysis center and pneumovaccine at hospital.  Pt is going to see neurologist on Wednesday.      Review of Systems  Constitutional: Positive for fatigue. Negative for chills, diaphoresis and fever.  HENT: Negative for congestion, ear pain, facial swelling, hearing loss, mouth sores, postnasal drip, sinus pain, sinus pressure and sore throat.   Eyes: Negative for pain and redness.  Respiratory: Negative for cough, chest tightness, shortness of breath and wheezing.   Cardiovascular: Negative for chest pain and palpitations.  Gastrointestinal: Negative for abdominal pain, blood in stool, diarrhea, nausea and vomiting.  Genitourinary: Negative for dysuria and frequency.  Musculoskeletal: Negative for back pain, gait problem, myalgias and neck pain.  Skin: Negative for rash.  Neurological: Negative for dizziness, speech difficulty, weakness, numbness and headaches.  Hematological: Negative for adenopathy. Does not bruise/bleed easily.  Psychiatric/Behavioral: Negative for behavioral problems, confusion, self-injury and suicidal ideas. The patient is not  nervous/anxious and is not hyperactive.     Past Medical History:  Diagnosis Date  . 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 Anemia, iron deficiency 03/24/2011   a. Recurrent GI bleed, tx with periodic iron infusions.  . 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 AVM (arteriovenous malformation) of duodenum, acquired    egds in 01/2012, 04/2011  . BPH (benign prostatic hyperplasia)   . CAD (coronary artery disease)    a. Cath 09/2012: moderate borderline CAD in mid LAD/small diagonal branch, mild RCA stenosis, to be managed medically   . Chronic lower back pain   . Diabetic peripheral neuropathy ([place]) 2014   foot pain.  . Essential hypertension 02/01/2012  . Fatty liver    on CT of 11/2010  . GERD (gastroesophageal reflux disease)   . GI bleed    a. Recurrent GI bleed, tx with IV iron. b. Per heme notes - likely AVMs 04/2012 (tx with cauterization several months ago).  . Hematuria    a. Urology note scan from 07/2012: cystoscopy without evidence for bladder lesion, only lateral hypertrophy of posterior urethra, bladder impression from BPH. b. Pt states he had "some tests" scheduled for later in July 2014.  . High cholesterol   . Hyperlipidemia 02/01/2012  . Hypertension   . Hypertensive urgency 07/06/2016  . 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 Orthostatic hypotension    a. Tx with florinef.  . Peripheral neuropathy   . Polyp, colonic    Colonoscopy 01/2012 "benign" polyp  . Prostate cancer ([place])   . Sleep apnea    "had mask; couldn't sleep in  it" (09/20/2012)  . Smoker   . SOB (shortness of breath) 07/06/2016  . Stage III chronic kidney disease ([name])    a. Stage 3 (DM with complications ->CKD, peripheral neuropathy).  . Stroke ([place])    x 2  . Symptomatic anemia 04/03/2015  . Tinea pedis 05/24/2012  . Tobacco use 02/01/2012  . Type 2 diabetes, uncontrolled, with neuropathy ([place]) 02/01/2012  . Type II diabetes mellitus ([place])    a. Dx 1994, uncontrolled.        Social History   Social History  . Marital status: Married    Spouse name: N/A  . Number of children: 2  . Years of education: N/A   Occupational History  . taken out of work due to back    Social History Main Topics  . Smoking status: Former Smoker    Packs/day: 1.00    Years: 30.00    Types: Cigarettes    Start date: 04/15/1968  . Smokeless tobacco: Never Used  . Alcohol use No     Comment: 09/20/2012 "Used to; stopped ~ 2009; never had problem w/it"  . Drug use: No  . Sexual activity: No   Other Topics Concern  . Not on file   Social History Narrative   Regular exercise: rides horses   Caffeine use: occasionally    Past Surgical History:  Procedure Laterality Date  . ENTEROSCOPY N/A 03/01/2013   Procedure: ENTEROSCOPY;  Surgeon: Inda Castle, MD;  Location: [facility];  Service: Endoscopy;  Laterality: N/A;  . ENTEROSCOPY N/A 06/26/2016   Procedure: ENTEROSCOPY;  Surgeon: Milus Banister, MD;  Location: [facility];  Service: Endoscopy;  Laterality: N/A;  this is a push enteroscopy  . ENTEROSCOPY N/A 10/23/2016   Procedure: ENTEROSCOPY;  Surgeon: Gatha Mayer, MD;  Location[facility] ENDOSCOPY;  Service: Endoscopy;  Laterality: N/A;  . GIVENS CAPSULE STUDY N/A 04/05/2015   Procedure: GIVENS CAPSULE STUDY;  Surgeon: Gatha Mayer, MD;  Location: [facility];  Service: Endoscopy;  Laterality: N/A;  . INGUINAL HERNIA REPAIR Right 2011  . LEFT HEART CATH AND CORONARY ANGIOGRAPHY N/A 12/23/2016   Procedure: LEFT HEART CATH AND CORONARY ANGIOGRAPHY;  Surgeon: Leonie Man, MD;  Location: [facility];  Service: Cardiovascular;  Laterality: N/A;  . LEFT HEART CATHETERIZATION WITH CORONARY ANGIOGRAM N/A 09/21/2012   Procedure: LEFT HEART CATHETERIZATION WITH CORONARY ANGIOGRAM;  Surgeon: Peter M Martinique, MD;  Location: [facility] CATH LAB;  Service: Cardiovascular;  Laterality: N/A;  . LUMBAR DISC SURGERY  1980's  . LYMPHADENECTOMY Bilateral 01/19/2013   Procedure:  LYMPHADENECTOMY;  Surgeon: Bernestine Amass, MD;  Location: [facility];  Service: Urology;  Laterality: Bilateral;  . ROBOT ASSISTED LAPAROSCOPIC RADICAL PROSTATECTOMY N/A 01/19/2013   Procedure: ROBOTIC ASSISTED LAPAROSCOPIC RADICAL PROSTATECTOMY;  Surgeon: Bernestine Amass, MD;  Location: [facility];  Service: Urology;  Laterality: N/A;  . SHOULDER OPEN ROTATOR CUFF REPAIR Left 1980's    Family History  Problem Relation Age of Onset  . Stroke Father        Died at 66  . Diabetes Mother   . Heart attack Brother        Died at 67  . Diabetes Sister   . Stomach cancer Brother   . Heart attack Sister     No Known Allergies  Current Outpatient Prescriptions on File Prior to Visit  Medication Sig Dispense Refill  . Calcium Acetate 668 (169 Ca) MG TABS Take 1 tablet by mouth daily.    [name] Kitchen  gabapentin (NEURONTIN) 300 MG capsule Take 1 capsule (300 mg total) by mouth at bedtime. (Patient taking differently: Take 300 mg by mouth 3 (three) times daily. ) 30 capsule 0  . multivitamin (RENA-VIT) TABS tablet Take 1 tablet by mouth daily.     No current facility-administered medications on file prior to visit.     BP 100/72   Pulse (!) 103   Temp 97.8 F (36.6 C) (Oral)   Resp 16   Ht 5\' 7"  (1.702 m)   Wt 127 lb 6.4 oz (57.8 kg)   SpO2 99%   BMI 19.95 kg/m        Objective:   Physical Exam  General Mental Status- Alert. General Appearance- Not in acute distress.   Skin General: Color- Normal Color. Moisture- Normal Moisture.  Neck Carotid Arteries- Normal color. Moisture- Normal Moisture. No carotid bruits. No JVD.  Chest and Lung Exam Auscultation: Breath Sounds:-Normal.  Cardiovascular Auscultation:Rythm- Regular. Murmurs & Other Heart Sounds:Auscultation of the heart reveals- No Murmurs.  Abdomen Inspection:-Inspeection Normal. Palpation/Percussion:Note:No mass. Palpation and Percussion of the abdomen reveal- Non Tender, Non Distended + BS, no rebound or  guarding.   Neurologic Cranial Nerve exam:- CN III-XII intact(No nystagmus), symmetric smile. Strength:- 5/5 equal and symmetric strength both upper and lower extremities.  Lower ext- no pedal edema.     Assessment & Plan:  You appear to be doing fairly well post hospitalization. (Acute respiratory failure/pulmonary edema appears resolved) For renal disease continue with dialysis.   For your fatigue and history of anemia will get CBC today to monitor small bowel AVM potential for bleed and get metabolic panel to assess electrolytes.   For coronary artery disease will get lipid panel today.  For depression increased your effexor to 75 mg.  Refilled your chronic meds  Follow up in 6 weeks or as needed  Moriah Shawley, Percell Miller, [employer]

## 2017-01-06 NOTE — Telephone Encounter (Signed)
CRITICAL VALUE STICKER  CRITICAL VALUE:Creat:9.86 and GFR:6.77  RECEIVER (on-site recipient of call):Angie  DATE & TIME NOTIFIED:01/06/17 @ 12:23pm  MESSENGER (representative from lab):Saa[place]  MD NOTIFIED:Edward Saguier,PA-C  TIME OF NOTIFICATION:12:31pm   RESPONSE:He stated that the pt is in the last stages of Kidney Failure.   Message routed to provider.//AB/CMA

## 2017-01-06 NOTE — Telephone Encounter (Signed)
Pt has already started dialysis.

## 2017-01-06 NOTE — Patient Instructions (Addendum)
You appear to be doing fairly well post hospitalization.(Acute respiratory failure/pulmonary edema appears resolved)  For renal disease continue with dialysis.   For your fatigue and history of anemia will get CBC today to monitor small bowel AVM potential for bleed and get metabolic panel to assess electrolytes.   For coronary artery disease will get lipid panel today.  For depression increased your effexor to 75 mg.  Refilled your chronic meds  Follow up in 6 weeks or as needed

## 2017-01-07 ENCOUNTER — Encounter: Payer: Self-pay | Admitting: Neurology

## 2017-01-07 ENCOUNTER — Ambulatory Visit (INDEPENDENT_AMBULATORY_CARE_PROVIDER_SITE_OTHER): Payer: Medicare Other | Admitting: Neurology

## 2017-01-07 VITALS — BP 88/56 | HR 84 | Wt 127.0 lb

## 2017-01-07 DIAGNOSIS — I639 Cerebral infarction, unspecified: Secondary | ICD-10-CM

## 2017-01-07 DIAGNOSIS — I420 Dilated cardiomyopathy: Secondary | ICD-10-CM | POA: Insufficient documentation

## 2017-01-07 DIAGNOSIS — R413 Other amnesia: Secondary | ICD-10-CM

## 2017-01-07 NOTE — Progress Notes (Signed)
[facility] [address] [place]. [place] [address] [phone]       OFFICE FOLLOW-UP NOTE  [name] Date of Birth:  1946/01/24 Medical Record Number:  [id number]   HPI: Initial office visit 08/21/2016 : 74 year African-American male seen today for first office follow-up visit following hospital admission for lacunar infarct in March 2018. History is obtained from the patient, review of electronic medical records and have personally reviewed imaging films. [name] is a 71 y.o. male he reports that his right leg has not been working very well for the past 2 weeks. He also describes that it feels slightly numb, but I'm not sure if he means numb or weak because he describes numbness as "it is not working right." Initially a code stroke was called because he is a very poor historian and when asked when he was last normal, he initially reports 6 AM. I then ask him when things started and he again reports 6 AM. I then asked when it was last completely normal, and he reported 2 weeks ago. He has been having progressive difficulty with walking since then.He was seen at dialysis were hemoglobin was measured and was found to before. 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 tpa given?: no, out of window. CT scan of the head showed no acute abnormality. MRI showed punctate acute infarcts along the lateral margin of the right thalamus. MRA of the brain showed no large vessel stenosis. Thoracic echo showed diminished ejection fraction of 45-50% with diffuse hypokinesis.. Carotid ultrasound showed no significant except been stenosis. LDL cholesterol 69 mg percent. Hemoglobin A1c was 4.9. He was seen by physical occupational therapy and started on Plavix for stroke prevention. He states his done well since discharge. His right leg continues to be weak. Did have a previous stroke in 2017 which  left him with residual right-sided weakness on that she has not fully recovered. Update 01/07/2017 : he returns for follow-up after last visit 4 months ago. He and his wife called requesting this visit because of new problems. Patient states she's had memory difficulties and cognitive decline ever since last 6 months and the symptoms to proceed. He forgets recent information and at times has trouble Reading sentences and finding words. He knows what he wants to say but wants to come out easily. He is also requiring more help to activities like buttoning his clothes and not himself. He does use a cane but states his balance is poor and was frequently. Patient says he has not discussed this with his primary physician and has not had any recent lab work or brain imaging studies done. The patient is not been taking aspirin despite history of stroke due to history of GI bleeding. His blood pressure usually tends to run low sided today it is 88/56. He is tolerating Crestor well without muscle aches or pains. He denies any significant head injury with loss of consciousness, seizures, new strokes or TIA symptoms ROS:   14 system review of systems is positive for  Fatigue, unexpected weight change, cough, constipation, cold intolerance, incontinence of bladder, anemia, memory loss, speech difficultss, depression, back pain, walking difficulty and all systems negativeand all other systems negative PMH:  Past Medical History:  Diagnosis Date  . 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 Anemia, iron deficiency 03/24/2011   a. Recurrent GI bleed, tx with periodic  iron infusions.  . 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 AVM (arteriovenous malformation) of duodenum, acquired    egds in 01/2012, 04/2011  . BPH (benign prostatic hyperplasia)   . CAD (coronary artery disease)    a. Cath 09/2012: moderate borderline CAD in mid LAD/small diagonal branch, mild RCA stenosis, to be managed medically   . Chronic lower back  pain   . Diabetic peripheral neuropathy ([place]) 2014   foot pain.  . Essential hypertension 02/01/2012  . Fatty liver    on CT of 11/2010  . GERD (gastroesophageal reflux disease)   . GI bleed    a. Recurrent GI bleed, tx with IV iron. b. Per heme notes - likely AVMs 04/2012 (tx with cauterization several months ago).  . Hematuria    a. Urology note scan from 07/2012: cystoscopy without evidence for bladder lesion, only lateral hypertrophy of posterior urethra, bladder impression from BPH. b. Pt states he had "some tests" scheduled for later in July 2014.  . High cholesterol   . Hyperlipidemia 02/01/2012  . Hypertension   . Hypertensive urgency 07/06/2016  . 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 Orthostatic hypotension    a. Tx with florinef.  . Peripheral neuropathy   . Polyp, colonic    Colonoscopy 01/2012 "benign" polyp  . Prostate cancer ([place])   . Sleep apnea    "had mask; couldn't sleep in it" (09/20/2012)  . Smoker   . SOB (shortness of breath) 07/06/2016  . Stage III chronic kidney disease ([name])    a. Stage 3 (DM with complications ->CKD, peripheral neuropathy).  . Stroke ([place])    x 2  . Symptomatic anemia 04/03/2015  . Tinea pedis 05/24/2012  . Tobacco use 02/01/2012  . Type 2 diabetes, uncontrolled, with neuropathy ([place]) 02/01/2012  . Type II diabetes mellitus ([place])    a. Dx 1994, uncontrolled.     Social History:  Social History   Social History  . Marital status: Married    Spouse name: N/A  . Number of children: 2  . Years of education: N/A   Occupational History  . taken out of work due to back    Social History Main Topics  . Smoking status: Current Every Day Smoker    Packs/day: 0.50    Years: 30.00    Types: Cigarettes    Start date: 04/15/1968  . Smokeless tobacco: Never Used     Comment: start back a couple of months ago  . Alcohol use No     Comment: 09/20/2012 "Used to; stopped ~ 2009; never had problem w/it"  . Drug use: No  . Sexual  activity: No   Other Topics Concern  . Not on file   Social History Narrative   Regular exercise: rides horses   Caffeine use: occasionally    Medications:   Current Outpatient Prescriptions on File Prior to Visit  Medication Sig Dispense Refill  . Calcium Acetate 668 (169 Ca) MG TABS Take 1 tablet by mouth daily.    . carvedilol (COREG) 3.125 MG tablet Take 1 tablet (3.125 mg total) by mouth 2 (two) times daily with a meal. 180 tablet 1  . gabapentin (NEURONTIN) 300 MG capsule Take 1 capsule (300 mg total) by mouth at bedtime. (Patient taking differently: Take 300 mg by mouth 3 (three) times daily. ) 30 capsule 0  . lisinopril (PRINIVIL,ZESTRIL) 5 MG tablet Take 1 tablet (5 mg total) by mouth daily. 90 tablet 1  . multivitamin (RENA-VIT) TABS  tablet Take 1 tablet by mouth daily.    . ranitidine (ZANTAC) 150 MG capsule Take 1 capsule (150 mg total) by mouth 2 (two) times daily. 180 capsule 1  . rosuvastatin (CRESTOR) 40 MG tablet Take 1 tablet (40 mg total) by mouth daily. 90 tablet 1  . venlafaxine XR (EFFEXOR-XR) 75 MG 24 hr capsule Take 1 capsule (75 mg total) by mouth daily with breakfast. 30 capsule 3   No current facility-administered medications on file prior to visit.     Allergies:  No Known Allergies  Physical Exam General: Cachectic frail middle-aged African-American male, seated, in no evident distress Head: head normocephalic and atraumatic.  Neck: supple with no carotid or supraclavicular bruits Cardiovascular: regular rate and rhythm, no murmurs Musculoskeletal: no deformity Skin:  no rash/petichiae Vascular:  Normal pulses all extremities Vitals:   01/07/17 1452  BP: (!) 88/56  Pulse: 84   Neurologic Exam Mental Status: Awake and fully alert. Oriented to place and time. Recent and remote memory intact. Attention span, concentration and fund of knowledge appropriate. Mood and affect appropriate. Mini-Mental status exam scored 19/30 with deficits in orientation,  registration and recall following 3 step commands. Able to name only 9 animals with for legs. Clock drawing 1/4 only. Patient's cooperation was limited.Geriatric depression scale 3 not depressed Cranial Nerves: Fundoscopic exam reveals sharp disc margins. Pupils equal, briskly reactive to light. Extraocular movements full without nystagmus. Visual fields full to confrontation. Hearing intact. Facial sensation intact. Face, tongue, palate moves normally and symmetrically.  Motor: Normal bulk and tone. Normal strength in all tested extremity muscles Except mild weakness of right hip legs is an ankle dorsiflexors. Tone slightly increased in the right leg compared to the left. Fine finger movements are diminished on the right. Orbits left over right upper extremity.. Sensory.: intact to touch ,pinprick .position and vibratory sensation.  Coordination: Rapid alternating movements normal in all extremities. Finger-to-nose and heel-to-shin performed accurately bilaterally. Gait and Station: Arises from chair without difficulty. Stance is normal. Gait demonstrates slight dragging of the right leg. Uses a cane. . unable to heel, toe and tandem walk without difficulty.  Reflexes: 1+ and symmetric. Toes downgoing.       ASSESSMENT: 48 year With chronic right leg weakness following remote left brain subcortical infarct from lacunar disease. Silent right thalamic lacunar infarct in March 2018 without corresponding clinical symptoms. Multiple vascular risk factors of hypertension, diabetes, coronary artery disease hyper lipidemia and cerebrovascular disease New complaints of memory loss and cognitive worsening over the last 6 months of undetermined etiology.   PLAN: I had a long discussion the patient and his wife regarding his new complaints of cognitive worsening and memory loss which is of undetermined etiology. I recommend workup with checking labs for reversible causes of memory loss, EEG and MRI scan of the  brain We will arises to continue to use cane or times perform the precautions. He will return for follow-up in 2 months or call earlier if necessary.We talked briefly about management of memory problems. May consider trial of Aricept-like medications if no reversible etiologies are founds after tests. Greater than 50% of time during this 35 minute visit was spent on counseling,explanation of diagnosis of memory loss and cognitive impairment, planning of further management, discussion with patient and family and coordination of care Antony Contras, MD  [facility] [address] [place], [place] [address]  Phone [phone] Fax [phone] Note: This document was prepared with digital dictation and possible smart  Company secretary. Any transcriptional errors that result from this process are unintentional

## 2017-01-07 NOTE — Patient Instructions (Addendum)
I had a long discussion the patient and his wife regarding his new complaints of cognitive worsening and memory loss which is of undetermined etiology. I recommend workup with checking labs for reversible causes of memory loss, EEG and MRI scan of the brain We will arises to continue to use cane or times perform the precautions. He will return for follow-up in 2 months or call earlier if necessary.We talked briefly about management of memory problems. May consider trial of Aricept-like medications if no reversible etiologies are founds after tests.  Management of Memory Problems  There are some general things you can do to help manage your memory problems.  Your memory may not in fact recover, but by using techniques and strategies you will be able to manage your memory difficulties better.  1)  Establish a routine.  Try to establish and then stick to a regular routine.  By doing this, you will get used to what to expect and you will reduce the need to rely on your memory.  Also, try to do things at the same time of day, such as taking your medication or checking your calendar first thing in the morning.  Think about think that you can do as a part of a regular routine and make a list.  Then enter them into a daily planner to remind you.  This will help you establish a routine.  2)  Organize your environment.  Organize your environment so that it is uncluttered.  Decrease visual stimulation.  Place everyday items such as keys or cell phone in the same place every day (ie.  Basket next to front door)  Use post it notes with a brief message to yourself (ie. Turn off light, lock the door)  Use labels to indicate where things go (ie. Which cupboards are for food, dishes, etc.)  Keep a notepad and pen by the telephone to take messages  3)  Memory Aids  A diary or journal/notebook/daily planner  Making a list (shopping list, chore list, to do list that needs to be done)  Using an alarm as a  reminder (kitchen timer or cell phone alarm)  Using cell phone to store information (Notes, Calendar, Reminders)  Calendar/White board placed in a prominent position  Post-it notes  In order for memory aids to be useful, you need to have good habits.  It's no good remembering to make a note in your journal if you don't remember to look in it.  Try setting aside a certain time of day to look in journal.  4)  Improving mood and managing fatigue.  There may be other factors that contribute to memory difficulties.  Factors, such as anxiety, depression and tiredness can affect memory.  Regular gentle exercise can help improve your mood and give you more energy.  Simple relaxation techniques may help relieve symptoms of anxiety  Try to get back to completing activities or hobbies you enjoyed doing in the past.  Learn to pace yourself through activities to decrease fatigue.  Find out about some local support groups where you can share experiences with others.  Try and achieve 7-8 hours of sleep at night.

## 2017-01-07 NOTE — Progress Notes (Deleted)
Cardiology Office Note    Date:  01/07/2017   ID:  Mavric, Cortright 1945-07-01, MRN [id number]  PCP:  Mackie Pai, PA-C  Cardiologist: Dr. Irish Lack  No chief complaint on file.   History of Present Illness:  [name] is a 71 y.o. male with history of medically main CAD, chronic systolic and diastolic CHF, end-stage renal disease on hemodialysis, prior CVA, hypertension, HLD. He was admitted to the hospital with flash pulmonary edema and acute hypoxic respiratory failure with hypertensive emergency.cardiac catheterization 12/23/16 showed 70% mid LAD FFR 0.80, 80% first diagonal very small caliber vessel and 40% RCA. EF 25% by visual estimate. His EF was severely reduced out of proportion to his LAD lesion and cardiothoracic surgeons did not feelhe was a surgical candidate.2-D echo showed LVEF 20-25% with diffuse hypokinesis and grade 1 DD    Past Medical History:  Diagnosis Date  . 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 Anemia, iron deficiency 03/24/2011   a. Recurrent GI bleed, tx with periodic iron infusions.  . 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 AVM (arteriovenous malformation) of duodenum, acquired    egds in 01/2012, 04/2011  . BPH (benign prostatic hyperplasia)   . CAD (coronary artery disease)    a. Cath 09/2012: moderate borderline CAD in mid LAD/small diagonal branch, mild RCA stenosis, to be managed medically   . Chronic lower back pain   . Diabetic peripheral neuropathy ([place]) 2014   foot pain.  . Essential hypertension 02/01/2012  . Fatty liver    on CT of 11/2010  . GERD (gastroesophageal reflux disease)   . GI bleed    a. Recurrent GI bleed, tx with IV iron. b. Per heme notes - likely AVMs 04/2012 (tx with cauterization several months ago).  . Hematuria    a. Urology note scan from 07/2012: cystoscopy without evidence for bladder lesion, only lateral hypertrophy of posterior urethra, bladder impression from BPH. b. Pt states he had "some tests"  scheduled for later in July 2014.  . High cholesterol   . Hyperlipidemia 02/01/2012  . Hypertension   . Hypertensive urgency 07/06/2016  . 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 Orthostatic hypotension    a. Tx with florinef.  . Peripheral neuropathy   . Polyp, colonic    Colonoscopy 01/2012 "benign" polyp  . Prostate cancer ([place])   . Sleep apnea    "had mask; couldn't sleep in it" (09/20/2012)  . Smoker   . SOB (shortness of breath) 07/06/2016  . Stage III chronic kidney disease ([name])    a. Stage 3 (DM with complications ->CKD, peripheral neuropathy).  . Stroke ([place])    x 2  . Symptomatic anemia 04/03/2015  . Tinea pedis 05/24/2012  . Tobacco use 02/01/2012  . Type 2 diabetes, uncontrolled, with neuropathy ([place]) 02/01/2012  . Type II diabetes mellitus ([place])    a. Dx 1994, uncontrolled.     Past Surgical History:  Procedure Laterality Date  . ENTEROSCOPY N/A 03/01/2013   Procedure: ENTEROSCOPY;  Surgeon: Inda Castle, MD;  Location: [facility];  Service: Endoscopy;  Laterality: N/A;  . ENTEROSCOPY N/A 06/26/2016   Procedure: ENTEROSCOPY;  Surgeon: Milus Banister, MD;  Location: [place];  Service: Endoscopy;  Laterality: N/A;  this is a push enteroscopy  . ENTEROSCOPY N/A 10/23/2016   Procedure: ENTEROSCOPY;  Surgeon: Gatha Mayer, MD;  Location: [facility] ENDOSCOPY;  Service: Endoscopy;  Laterality: N/A;  . GIVENS CAPSULE STUDY N/A 04/05/2015  Procedure: GIVENS CAPSULE STUDY;  Surgeon: Gatha Mayer, MD;  Location: [facility] ENDOSCOPY;  Service: Endoscopy;  Laterality: N/A;  . INGUINAL HERNIA REPAIR Right 2011  . LEFT HEART CATH AND CORONARY ANGIOGRAPHY N/A 12/23/2016   Procedure: LEFT HEART CATH AND CORONARY ANGIOGRAPHY;  Surgeon: Leonie Man, MD;  Location: [place] CV LAB;  Service: Cardiovascular;  Laterality: N/A;  . LEFT HEART CATHETERIZATION WITH CORONARY ANGIOGRAM N/A 09/21/2012   Procedure: LEFT HEART CATHETERIZATION WITH CORONARY ANGIOGRAM;  Surgeon:  Peter M Martinique, MD;  [facility] CATH LAB;  Service: Cardiovascular;  Laterality: N/A;  . LUMBAR DISC SURGERY  1980's  . LYMPHADENECTOMY Bilateral 01/19/2013   Procedure: LYMPHADENECTOMY;  Surgeon: Bernestine Amass, MD;  Location: [facility];  Service: Urology;  Laterality: Bilateral;  . ROBOT ASSISTED LAPAROSCOPIC RADICAL PROSTATECTOMY N/A 01/19/2013   Procedure: ROBOTIC ASSISTED LAPAROSCOPIC RADICAL PROSTATECTOMY;  Surgeon: Bernestine Amass, MD;  Location: [facility];  Service: Urology;  Laterality: N/A;  . SHOULDER OPEN ROTATOR CUFF REPAIR Left 1980's    Current Medications: No outpatient prescriptions have been marked as taking for the 01/12/17 encounter (Appointment) with Troy Burn, PA-C.     Allergies:   Patient has no known allergies.   Social History   Social History  . Marital status: Married    Spouse name: N/A  . Number of children: 2  . Years of education: N/A   Occupational History  . taken out of work due to back    Social History Main Topics  . Smoking status: Current Every Day Smoker    Packs/day: 0.50    Years: 30.00    Types: Cigarettes    Start date: 04/15/1968  . Smokeless tobacco: Never Used     Comment: start back a couple of months ago  . Alcohol use No     Comment: 09/20/2012 "Used to; stopped ~ 2009; never had problem w/it"  . Drug use: No  . Sexual activity: No   Other Topics Concern  . Not on file   Social History Narrative   Regular exercise: rides horses   Caffeine use: occasionally     Family History:  The patient's ***family history includes Diabetes in his mother and sister; Heart attack in his brother and sister; Stomach cancer in his brother; Stroke in his father.   ROS:   Please see the history of present illness.    ROS All other systems reviewed and are negative.   PHYSICAL EXAM:   VS:  There were no vitals taken for this visit.  Physical Exam  GEN: Well nourished, well developed, in no acute distress  HEENT: normal  Neck: no JVD,  carotid bruits, or masses Cardiac:RRR; no murmurs, rubs, or gallops  Respiratory:  clear to auscultation bilaterally, normal work of breathing GI: soft, nontender, nondistended, + BS Ext: without cyanosis, clubbing, or edema, Good distal pulses bilaterally MS: no deformity or atrophy  Skin: warm and dry, no rash Neuro:  Alert and Oriented x 3, Strength and sensation are intact Psych: euthymic mood, full affect  Wt Readings from Last 3 Encounters:  01/07/17 127 lb (57.6 kg)  01/06/17 127 lb 6.4 oz (57.8 kg)  12/25/16 120 lb 5.9 oz (54.6 kg)      Studies/Labs Reviewed:   EKG:  EKG is*** ordered today.  The ekg ordered today demonstrates ***  Recent Labs: 07/25/2016: B Natriuretic Peptide 1,389.4 07/26/2016: Magnesium 1.9 08/19/2016: Pro B Natriuretic peptide (BNP) 349.0 01/06/2017: ALT 9; BUN 31; Creatinine, Ser  9.82; Hemoglobin 12.1; Platelets 241.0; Potassium 4.7; Sodium 136   Lipid Panel    Component Value Date/Time   CHOL 184 01/06/2017 0927   TRIG 191.0 (H) 01/06/2017 0927   HDL 33.20 (L) 01/06/2017 0927   CHOLHDL 6 01/06/2017 0927   VLDL 38.2 01/06/2017 0927   LDLCALC 113 (H) 01/06/2017 0927   LDLDIRECT 117.0 09/25/2014 0908    Additional studies/ records that were reviewed today include:   ECHOCARDIOGRAM 12/22/16  Study Conclusions   - Left ventricle: The cavity size was normal. Wall thickness was   increased in a pattern of moderate LVH. Systolic function was   severely reduced. The estimated ejection fraction was in the   range of 20% to 25%. Diffuse hypokinesis. Doppler parameters are   consistent with abnormal left ventricular relaxation (grade 1   diastolic dysfunction). Doppler parameters are consistent with   high ventricular filling pressure.   Impressions:   - Severe global reduction in LV systolic function; mild diastolic   dysfunction; moderate LVH; trace MR and TR.   CARDIAC CATHETERIZATION10/9/18 Conclusion       Mid LAD lesion, 70  %stenosed. -- FFR 0.80 (Cutoff for Physiologic Significance is 0.80)  There is severe left ventricular systolic dysfunction. The left ventricular ejection fraction is less than 25% by visual estimate.  LV end diastolic pressure is mildly elevated.  1st Diag lesion, 80 %stenosed. Very small caliber vessel.  Mid RCA lesion, 40 %stenosed.   Overall, I suspect that his severely reduced EF is out of proportion to his LAD lesion alone given the global hypokinesis, however it is of large-caliber wraparound LAD, and therefore he would likely benefit from revascularization.   There are concerns are about his recurrent GI bleeds and ability take dual antiplatelet therapy.   Plan:  Return to nursing unit after sheath removal for ongoing care.  We will discontinue Plavix, and consult CT surgery for their opinion. - Consider LIMA-LAD with potential backup plan being bare-metal stent to the LAD   With no obvious culprit lesion, we will stop IV heparin      Glenetta Hew, M.D., M.S. Interventional Cardiologist     I personally reviewed the cath images. He has moderate LAD disease and more significant disease in a small diagonal branch.      ASSESSMENT:    1. Coronary artery disease involving native heart, angina presence unspecified, unspecified vessel or lesion type   2. Dilated cardiomyopathy ([place])   3. Chronic combined systolic and diastolic CHF (congestive heart failure) ([place])   4. Essential hypertension   5. Cerebrovascular accident (CVA), unspecified mechanism ([place])      PLAN:  In order of problems listed above:  CAD with 70% mid LAD, LV dysfunction out of proportion to this coronary disease. Surgeons did not feel like he was a surgical candidate and medical management recommended.no antiplatelets given his GI bleed history. Light senna pills started.  Dilated cardiomyopathy ejection fraction 20-25% on echo 12/22/16  Chronic combined systolic and diastolic CHF  Essential  hypertension  CVA 2  End-stage renal disease on hemodialysis   Medication Adjustments/Labs and Tests Ordered: Current medicines are reviewed at length with the patient today.  Concerns regarding medicines are outlined above.  Medication changes, Labs and Tests ordered today are listed in the Patient Instructions below. There are no Patient Instructions on file for this visit.   Signed, Ermalinda Barrios, PA-C  01/07/2017 2:55 PM    [place],  [place]  [address] Phone: [phone]; Fax: [phone]

## 2017-01-09 LAB — DEMENTIA PANEL
HOMOCYSTEINE: 14.8 umol/L (ref 0.0–15.0)
RPR Ser Ql: NONREACTIVE
TSH: 0.905 u[IU]/mL (ref 0.450–4.500)
VITAMIN B 12: 963 pg/mL (ref 232–1245)

## 2017-01-12 ENCOUNTER — Ambulatory Visit: Payer: Medicare Other | Admitting: Physician Assistant

## 2017-01-13 ENCOUNTER — Telehealth: Payer: Self-pay

## 2017-01-13 NOTE — Telephone Encounter (Signed)
-----   Message from Garvin Fila, MD sent at 01/12/2017  1:12 PM EDT ----- Mitchell Heir inform the patient that lab work for reversible causes of memory loss was normal

## 2017-01-13 NOTE — Telephone Encounter (Signed)
Left vm for patients wife on dpr to call back about lab work results for memory loss., ------

## 2017-01-14 NOTE — Telephone Encounter (Signed)
Rn call patients wife that the dementia panel lab work was normal. Pts wife who is on dpr verbalized understanding ------

## 2017-01-16 ENCOUNTER — Telehealth: Payer: Self-pay | Admitting: *Deleted

## 2017-01-16 NOTE — Telephone Encounter (Signed)
Received Physician Orders from [facility]; forwarded to provider/SLS 11/02

## 2017-01-20 ENCOUNTER — Ambulatory Visit
Admission: RE | Admit: 2017-01-20 | Discharge: 2017-01-20 | Disposition: A | Discharge status: Home / Self Care | Payer: Medicare Other | Source: Ambulatory Visit | Attending: Neurology | Admitting: Neurology

## 2017-01-20 DIAGNOSIS — R413 Other amnesia: Secondary | ICD-10-CM | POA: Diagnosis not present

## 2017-01-21 ENCOUNTER — Telehealth: Payer: Self-pay | Admitting: Family Medicine

## 2017-01-21 NOTE — Telephone Encounter (Signed)
[facility]: Phone [phone] ext [phone].  Fax [phone].  Please have provider sign orders and fax back to [facility] jennifer.

## 2017-01-22 ENCOUNTER — Ambulatory Visit (INDEPENDENT_AMBULATORY_CARE_PROVIDER_SITE_OTHER): Payer: Medicare Other | Admitting: Neurology

## 2017-01-22 DIAGNOSIS — R413 Other amnesia: Secondary | ICD-10-CM

## 2017-01-22 DIAGNOSIS — R410 Disorientation, unspecified: Secondary | ICD-10-CM

## 2017-01-22 NOTE — Telephone Encounter (Signed)
Forwarded to PCP folders, pulled and given to Princess/SLS 11/08

## 2017-01-22 NOTE — Telephone Encounter (Signed)
Have you seen the orders?

## 2017-01-22 NOTE — Telephone Encounter (Signed)
Patient's PCP is Percell Miller

## 2017-01-24 NOTE — Telephone Encounter (Signed)
Will you pull the orders I need to sign and show them to me/remind me to sign since already delay since I was not in office.

## 2017-01-26 NOTE — Telephone Encounter (Signed)
Orders on desk

## 2017-01-27 NOTE — Telephone Encounter (Signed)
Signed pt order and Dr. Burnett Kanaris. Gave to MA to fax.

## 2017-02-02 ENCOUNTER — Telehealth: Payer: Self-pay

## 2017-02-02 NOTE — Telephone Encounter (Signed)
Rn call patients wife on dpr. MRi scan of brain shows changes of hardening of the tiny blood vessels deep inside the brain and mild shrinkage of the brain. No new or worrisome finding. Compared to previous MRI there is slight worsening. Pt's wife verbalized understanding.  Rn call patient wife loretta on dpr. Rn stated EEG was normal. Pt verbalized understanding. ------ ------

## 2017-02-02 NOTE — Telephone Encounter (Signed)
-----   Message from Garvin Fila, MD sent at 01/30/2017  6:41 PM EST ----- Kindly inform the patient that MRI scan of the brain shows changes of hardening of the tiny blood vessels deep inside the brain and mild shrinkage of the brain.  No new or worrisome finding.  Compared to previous MRI there is slight worsening.

## 2017-02-10 MED ORDER — CARVEDILOL 3.125 MG PO TABS
3.13 mg | ORAL_TABLET | ORAL | Status: DC
Start: 2017-02-10 — End: 2017-02-10

## 2017-02-10 MED ORDER — POLYETHYLENE GLYCOL 3350 17 G PO PACK
17.00 g | PACK | ORAL | Status: DC
Start: 2017-02-11 — End: 2017-02-10

## 2017-02-10 MED ORDER — ROSUVASTATIN CALCIUM 20 MG PO TABS
40.00 mg | ORAL_TABLET | ORAL | Status: DC
Start: 2017-02-11 — End: 2017-02-10

## 2017-02-10 MED ORDER — VENLAFAXINE HCL ER 37.5 MG PO CP24
37.50 mg | ORAL_CAPSULE | ORAL | Status: DC
Start: 2017-02-11 — End: 2017-02-10

## 2017-02-10 MED ORDER — SENNOSIDES-DOCUSATE SODIUM 8.6-50 MG PO TABS
1.00 | ORAL_TABLET | ORAL | Status: DC
Start: 2017-02-10 — End: 2017-02-10

## 2017-02-10 MED ORDER — NYSTATIN 100000 UNIT/ML MT SUSP
500000.00 | OROMUCOSAL | Status: DC
Start: 2017-02-10 — End: 2017-02-10

## 2017-02-10 MED ORDER — GABAPENTIN 300 MG PO CAPS
300.00 mg | ORAL_CAPSULE | ORAL | Status: DC
Start: 2017-02-10 — End: 2017-02-10

## 2017-02-10 MED ORDER — ACETAMINOPHEN 325 MG PO TABS
650.00 mg | ORAL_TABLET | ORAL | Status: DC
Start: 2017-02-10 — End: 2017-02-10

## 2017-02-10 MED ORDER — HYDROCODONE-ACETAMINOPHEN 5-325 MG PO TABS
1.00 | ORAL_TABLET | ORAL | Status: DC
Start: ? — End: 2017-02-10

## 2017-02-10 MED ORDER — PANTOPRAZOLE SODIUM 40 MG PO TBEC
40.00 mg | DELAYED_RELEASE_TABLET | ORAL | Status: DC
Start: 2017-02-11 — End: 2017-02-10

## 2017-02-10 MED ORDER — DRONABINOL 2.5 MG PO CAPS
2.50 mg | ORAL_CAPSULE | ORAL | Status: DC
Start: 2017-02-10 — End: 2017-02-10

## 2017-02-11 ENCOUNTER — Encounter: Payer: Self-pay | Admitting: Interventional Cardiology

## 2017-02-16 ENCOUNTER — Telehealth: Payer: Self-pay | Admitting: Medical

## 2017-02-16 NOTE — Telephone Encounter (Signed)
Patient following up this week.  Recent hospitalized with multiple facial fractures.  Already complicated patient as well.  Needs to be 30-minute appointment.

## 2017-02-19 ENCOUNTER — Ambulatory Visit: Payer: Medicare Other | Admitting: Interventional Cardiology

## 2017-02-19 ENCOUNTER — Ambulatory Visit: Payer: Medicare Other | Admitting: Medical

## 2017-02-19 NOTE — Telephone Encounter (Signed)
Done

## 2017-02-23 ENCOUNTER — Ambulatory Visit: Payer: Medicare Other | Admitting: Nurse Practitioner

## 2017-03-10 ENCOUNTER — Emergency Department (HOSPITAL_COMMUNITY): Payer: Medicare Other

## 2017-03-10 ENCOUNTER — Inpatient Hospital Stay (HOSPITAL_COMMUNITY)
Admission: EM | Admit: 2017-03-10 | Discharge: 2017-03-12 | DRG: 100 | Disposition: A | Discharge status: Home Health | Payer: Medicare Other | Attending: Internal Medicine | Admitting: Internal Medicine

## 2017-03-10 ENCOUNTER — Encounter (HOSPITAL_COMMUNITY): Payer: Self-pay | Admitting: Emergency Medicine

## 2017-03-10 DIAGNOSIS — E8889 Other specified metabolic disorders: Secondary | ICD-10-CM | POA: Diagnosis present

## 2017-03-10 DIAGNOSIS — E1122 Type 2 diabetes mellitus with diabetic chronic kidney disease: Secondary | ICD-10-CM | POA: Diagnosis present

## 2017-03-10 DIAGNOSIS — H35033 Hypertensive retinopathy, bilateral: Secondary | ICD-10-CM | POA: Diagnosis present

## 2017-03-10 DIAGNOSIS — I5022 Chronic systolic (congestive) heart failure: Secondary | ICD-10-CM | POA: Diagnosis present

## 2017-03-10 DIAGNOSIS — I2510 Atherosclerotic heart disease of native coronary artery without angina pectoris: Secondary | ICD-10-CM | POA: Diagnosis present

## 2017-03-10 DIAGNOSIS — F17210 Nicotine dependence, cigarettes, uncomplicated: Secondary | ICD-10-CM | POA: Diagnosis present

## 2017-03-10 DIAGNOSIS — D631 Anemia in chronic kidney disease: Secondary | ICD-10-CM | POA: Diagnosis present

## 2017-03-10 DIAGNOSIS — Z833 Family history of diabetes mellitus: Secondary | ICD-10-CM

## 2017-03-10 DIAGNOSIS — K219 Gastro-esophageal reflux disease without esophagitis: Secondary | ICD-10-CM | POA: Diagnosis present

## 2017-03-10 DIAGNOSIS — F0150 Vascular dementia without behavioral disturbance: Secondary | ICD-10-CM | POA: Diagnosis present

## 2017-03-10 DIAGNOSIS — G4089 Other seizures: Secondary | ICD-10-CM | POA: Diagnosis present

## 2017-03-10 DIAGNOSIS — K760 Fatty (change of) liver, not elsewhere classified: Secondary | ICD-10-CM | POA: Diagnosis present

## 2017-03-10 DIAGNOSIS — S065X9A Traumatic subdural hemorrhage with loss of consciousness of unspecified duration, initial encounter: Secondary | ICD-10-CM | POA: Diagnosis not present

## 2017-03-10 DIAGNOSIS — N400 Enlarged prostate without lower urinary tract symptoms: Secondary | ICD-10-CM | POA: Diagnosis present

## 2017-03-10 DIAGNOSIS — E7800 Pure hypercholesterolemia, unspecified: Secondary | ICD-10-CM | POA: Diagnosis present

## 2017-03-10 DIAGNOSIS — S065XAA Traumatic subdural hemorrhage with loss of consciousness status unknown, initial encounter: Secondary | ICD-10-CM | POA: Diagnosis present

## 2017-03-10 DIAGNOSIS — I10 Essential (primary) hypertension: Secondary | ICD-10-CM | POA: Diagnosis not present

## 2017-03-10 DIAGNOSIS — G9340 Encephalopathy, unspecified: Secondary | ICD-10-CM | POA: Diagnosis not present

## 2017-03-10 DIAGNOSIS — Z8673 Personal history of transient ischemic attack (TIA), and cerebral infarction without residual deficits: Secondary | ICD-10-CM | POA: Diagnosis not present

## 2017-03-10 DIAGNOSIS — F0390 Unspecified dementia without behavioral disturbance: Secondary | ICD-10-CM | POA: Diagnosis present

## 2017-03-10 DIAGNOSIS — E785 Hyperlipidemia, unspecified: Secondary | ICD-10-CM | POA: Diagnosis present

## 2017-03-10 DIAGNOSIS — Z8546 Personal history of malignant neoplasm of prostate: Secondary | ICD-10-CM

## 2017-03-10 DIAGNOSIS — I132 Hypertensive heart and chronic kidney disease with heart failure and with stage 5 chronic kidney disease, or end stage renal disease: Secondary | ICD-10-CM | POA: Diagnosis present

## 2017-03-10 DIAGNOSIS — I6200 Nontraumatic subdural hemorrhage, unspecified: Secondary | ICD-10-CM

## 2017-03-10 DIAGNOSIS — E1151 Type 2 diabetes mellitus with diabetic peripheral angiopathy without gangrene: Secondary | ICD-10-CM | POA: Diagnosis present

## 2017-03-10 DIAGNOSIS — E1142 Type 2 diabetes mellitus with diabetic polyneuropathy: Secondary | ICD-10-CM | POA: Diagnosis present

## 2017-03-10 DIAGNOSIS — Z992 Dependence on renal dialysis: Secondary | ICD-10-CM

## 2017-03-10 DIAGNOSIS — N186 End stage renal disease: Secondary | ICD-10-CM | POA: Diagnosis present

## 2017-03-10 DIAGNOSIS — Z823 Family history of stroke: Secondary | ICD-10-CM

## 2017-03-10 DIAGNOSIS — R410 Disorientation, unspecified: Secondary | ICD-10-CM | POA: Diagnosis present

## 2017-03-10 DIAGNOSIS — G4733 Obstructive sleep apnea (adult) (pediatric): Secondary | ICD-10-CM | POA: Diagnosis present

## 2017-03-10 LAB — COMPREHENSIVE METABOLIC PANEL
ALK PHOS: 108 U/L (ref 38–126)
ALT: 12 U/L — ABNORMAL LOW (ref 17–63)
ANION GAP: 15 (ref 5–15)
AST: 13 U/L — ABNORMAL LOW (ref 15–41)
Albumin: 4.2 g/dL (ref 3.5–5.0)
BILIRUBIN TOTAL: 0.8 mg/dL (ref 0.3–1.2)
BUN: 41 mg/dL — AB (ref 6–20)
CALCIUM: 9.4 mg/dL (ref 8.9–10.3)
CO2: 23 mmol/L (ref 22–32)
Chloride: 101 mmol/L (ref 101–111)
Creatinine, Ser: 9.89 mg/dL — ABNORMAL HIGH (ref 0.61–1.24)
GFR calc Af Amer: 5 mL/min — ABNORMAL LOW (ref >60)
GFR, EST NON AFRICAN AMERICAN: 5 mL/min — AB (ref >60)
Glucose, Bld: 108 mg/dL — ABNORMAL HIGH (ref 65–99)
POTASSIUM: 3.9 mmol/L (ref 3.5–5.1)
Sodium: 139 mmol/L (ref 135–145)
TOTAL PROTEIN: 8.8 g/dL — AB (ref 6.5–8.1)

## 2017-03-10 LAB — ETHANOL: Alcohol, Ethyl (B): <10 mg/dL (ref <10)

## 2017-03-10 LAB — CBC
HEMATOCRIT: 40.4 % (ref 39.0–52.0)
HEMOGLOBIN: 13.6 g/dL (ref 13.0–17.0)
MCH: 31.1 pg (ref 26.0–34.0)
MCHC: 33.7 g/dL (ref 30.0–36.0)
MCV: 92.2 fL (ref 78.0–100.0)
Platelets: 245 10*3/uL (ref 150–400)
RBC: 4.38 MIL/uL (ref 4.22–5.81)
RDW: 17.9 % — ABNORMAL HIGH (ref 11.5–15.5)
WBC: 4.8 10*3/uL (ref 4.0–10.5)

## 2017-03-10 LAB — APTT: aPTT: 55 seconds — ABNORMAL HIGH (ref 24–36)

## 2017-03-10 LAB — DIFFERENTIAL
Basophils Absolute: 0 10*3/uL (ref 0.0–0.1)
Basophils Relative: 1 %
EOS ABS: 0.2 10*3/uL (ref 0.0–0.7)
Eosinophils Relative: 3 %
LYMPHS ABS: 1 10*3/uL (ref 0.7–4.0)
Lymphocytes Relative: 20 %
MONOS PCT: 11 %
Monocytes Absolute: 0.5 10*3/uL (ref 0.1–1.0)
Neutro Abs: 3.2 10*3/uL (ref 1.7–7.7)
Neutrophils Relative %: 65 %

## 2017-03-10 LAB — I-STAT CHEM 8, ED
BUN: 39 mg/dL — ABNORMAL HIGH (ref 6–20)
CALCIUM ION: 1.03 mmol/L — AB (ref 1.15–1.40)
CREATININE: 10.3 mg/dL — AB (ref 0.61–1.24)
Chloride: 104 mmol/L (ref 101–111)
GLUCOSE: 108 mg/dL — AB (ref 65–99)
HEMATOCRIT: 44 % (ref 39.0–52.0)
HEMOGLOBIN: 15 g/dL (ref 13.0–17.0)
Potassium: 3.9 mmol/L (ref 3.5–5.1)
Sodium: 142 mmol/L (ref 135–145)
TCO2: 24 mmol/L (ref 22–32)

## 2017-03-10 LAB — CBG MONITORING, ED: Glucose-Capillary: 92 mg/dL (ref 65–99)

## 2017-03-10 LAB — PROTIME-INR
INR: 1.05
Prothrombin Time: 13.6 seconds (ref 11.4–15.2)

## 2017-03-10 LAB — I-STAT TROPONIN, ED: TROPONIN I, POC: 0.01 ng/mL (ref 0.00–0.08)

## 2017-03-10 MED ORDER — VALPROATE SODIUM 500 MG/5ML IV SOLN
250.0000 mg | Freq: Two times a day (BID) | INTRAVENOUS | Status: DC
  Administered 2017-03-11 – 2017-03-12 (×2): 250 mg via INTRAVENOUS
  Filled 2017-03-10 (×4): qty 2.5

## 2017-03-10 MED ORDER — VALPROATE SODIUM 500 MG/5ML IV SOLN
750.0000 mg | Freq: Once | INTRAVENOUS | Status: AC
  Administered 2017-03-10: 750 mg via INTRAVENOUS
  Filled 2017-03-10: qty 7.5

## 2017-03-10 NOTE — ED Triage Notes (Signed)
Per EMS:  Pt presents with 2 days of AMS, combative with home health today.  Patient refused to take his PM meds yesterday, and AM meds today.  Patient combative and balling up fists with EMS.  5 versed, 2.5 haldol given IM en route.  Pt does dialysis MWF, went Sunday due to the holiday.

## 2017-03-10 NOTE — ED Notes (Signed)
Called patient's wife to provide update, okay'd by patient.  No answer.

## 2017-03-10 NOTE — ED Provider Notes (Signed)
[facility] EMERGENCY DEPARTMENT Provider Note   CSN: [id number] Arrival date & time: 03/10/17  1435     History   Chief Complaint Chief Complaint  Patient presents with  . Altered Mental Status    HPI [name] is a 71 y.o. male hx of ESRD on HD (last HD 3 days ago), DM, HTN, here with AMS. Patient was at baseline until this morning. Wife woke him up around 10 am. He refused to get up and appeared confused. Patient also didn't recognize her and became aggressive. EMS got there and he was aggressive to staff and was given versed, haldol. Denies any recent falls   The history is provided by the patient, the EMS personnel and the spouse. The history is limited by the condition of the patient.   Level V caveat- AMS   Past Medical History:  Diagnosis Date  . 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 Anemia, iron deficiency 03/24/2011   a. Recurrent GI bleed, tx with periodic iron infusions.  . 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 AVM (arteriovenous malformation) of duodenum, acquired    egds in 01/2012, 04/2011  . BPH (benign prostatic hyperplasia)   . CAD (coronary artery disease)    a. Cath 09/2012: moderate borderline CAD in mid LAD/small diagonal branch, mild RCA stenosis, to be managed medically   . Chronic lower back pain   . Diabetic peripheral neuropathy ([place]) 2014   foot pain.  . Essential hypertension 02/01/2012  . Fatty liver    on CT of 11/2010  . GERD (gastroesophageal reflux disease)   . GI bleed    a. Recurrent GI bleed, tx with IV iron. b. Per heme notes - likely AVMs 04/2012 (tx with cauterization several months ago).  . Hematuria    a. Urology note scan from 07/2012: cystoscopy without evidence for bladder lesion, only lateral hypertrophy of posterior urethra, bladder impression from BPH. b. Pt states he had "some tests" scheduled for later in July 2014.  . High cholesterol   . Hyperlipidemia 02/01/2012  . Hypertension   .  Hypertensive urgency 07/06/2016  . 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 Orthostatic hypotension    a. Tx with florinef.  . Peripheral neuropathy   . Polyp, colonic    Colonoscopy 01/2012 "benign" polyp  . Prostate cancer ([place])   . Sleep apnea    "had mask; couldn't sleep in it" (09/20/2012)  . Smoker   . SOB (shortness of breath) 07/06/2016  . Stage III chronic kidney disease ([name])    a. Stage 3 (DM with complications ->CKD, peripheral neuropathy).  . Stroke ([place])    x 2  . Symptomatic anemia 04/03/2015  . Tinea pedis 05/24/2012  . Tobacco use 02/01/2012  . Type 2 diabetes, uncontrolled, with neuropathy ([place]) 02/01/2012  . Type II diabetes mellitus ([place])    a. Dx 1994, uncontrolled.     Patient Active Problem List   Diagnosis Date Noted  . Dilated cardiomyopathy ([place]) 01/07/2017  . Acute respiratory failure with hypoxia ([place]) 12/22/2016  . Acute on chronic combined systolic and diastolic CHF (congestive heart failure) ([place]) 12/22/2016  . HLD (hyperlipidemia) 12/22/2016  . Elevated troponin I level   . Acute blood loss anemia   . Fall 10/22/2016  . GIB (gastrointestinal bleeding) 10/21/2016  . Abnormal echocardiogram 08/06/2016  . Flash pulmonary edema ([place]) 07/06/2016  . SOB (shortness of breath) 07/06/2016  . Hypertensive urgency 07/06/2016  . Anemia in chronic kidney disease,  on chronic dialysis ([place])   . Anemia due to chronic kidney disease   . Diabetic retinopathy ([place]) 01/16/2016  . Dysphagia 12/21/2015  . Environmental allergies 12/04/2015  . Allergic rhinitis 10/28/2015  . ESRD (end stage renal disease) ([place]) 10/28/2015  . Stroke ([place]) 04/06/2015  . AVM (arteriovenous malformation) of small bowel, acquired with hemorrhage ([place]) 04/06/2015  . Malnutrition of moderate degree 04/05/2015  . Acute renal failure superimposed on stage 4 chronic kidney disease ([place]) 04/04/2015  . Symptomatic anemia 04/03/2015  . Benign paroxysmal positional vertigo  09/26/2014  . Weakness of right lower extremity 02/28/2014  . Metatarsalgia of both feet 09/13/2013  . Equinus deformity of foot, acquired 09/13/2013  . Diabetes mellitus type 2, controlled, with complications ([place]) 84/69/6295  . GERD (gastroesophageal reflux disease) 03/24/2013  . GI bleed 02/28/2013  . Cerebral infarction ([place]) 11/17/2012  . Dizziness and giddiness 10/31/2012  . Prostate cancer ([place]) 10/27/2012  . Diabetic neuropathy ([place]) 10/04/2012  . CAD (coronary artery disease) 10/04/2012  . Hoarseness 06/26/2012  . Hematuria 05/24/2012  . Tinea pedis 05/24/2012  . Back pain 02/23/2012  . Erectile dysfunction 02/03/2012  . Type 2 diabetes, uncontrolled, with neuropathy ([place]) 02/01/2012  . Essential hypertension 02/01/2012  . Tobacco use 02/01/2012  . Hyperlipidemia 02/01/2012  . Anemia of renal disease 05/14/2011    Past Surgical History:  Procedure Laterality Date  . ENTEROSCOPY N/A 03/01/2013   Procedure: ENTEROSCOPY;  Surgeon: Inda Castle, MD;  Location: [facility];  Service: Endoscopy;  Laterality: N/A;  . ENTEROSCOPY N/A 06/26/2016   Procedure: ENTEROSCOPY;  Surgeon: Milus Banister, MD;  Location: [facility];  Service: Endoscopy;  Laterality: N/A;  this is a push enteroscopy  . ENTEROSCOPY N/A 10/23/2016   Procedure: ENTEROSCOPY;  Surgeon: Gatha Mayer, MD;  Location: [facility] ENDOSCOPY;  Service: Endoscopy;  Laterality: N/A;  . GIVENS CAPSULE STUDY N/A 04/05/2015   Procedure: GIVENS CAPSULE STUDY;  Surgeon: Gatha Mayer, MD;  Location: [facility];  Service: Endoscopy;  Laterality: N/A;  . INGUINAL HERNIA REPAIR Right 2011  . LEFT HEART CATH AND CORONARY ANGIOGRAPHY N/A 12/23/2016   Procedure: LEFT HEART CATH AND CORONARY ANGIOGRAPHY;  Surgeon: Leonie Man, MD;  Location: [facility] CV LAB;  Service: Cardiovascular;  Laterality: N/A;  . LEFT HEART CATHETERIZATION WITH CORONARY ANGIOGRAM N/A 09/21/2012   Procedure: LEFT HEART CATHETERIZATION WITH CORONARY  ANGIOGRAM;  Surgeon: Peter M Martinique, MD;  Location: [facility] CATH LAB;  Service: Cardiovascular;  Laterality: N/A;  . LUMBAR DISC SURGERY  1980's  . LYMPHADENECTOMY Bilateral 01/19/2013   Procedure: LYMPHADENECTOMY;  Surgeon: Bernestine Amass, MD;  Location: [facility];  Service: Urology;  Laterality: Bilateral;  . ROBOT ASSISTED LAPAROSCOPIC RADICAL PROSTATECTOMY N/A 01/19/2013   Procedure: ROBOTIC ASSISTED LAPAROSCOPIC RADICAL PROSTATECTOMY;  Surgeon: Bernestine Amass, MD;  Location: [facility];  Service: Urology;  Laterality: N/A;  . SHOULDER OPEN ROTATOR CUFF REPAIR Left 1980's       Home Medications    Prior to Admission medications   Medication Sig Start Date End Date Taking? Authorizing Provider  Calcium Acetate 668 (169 Ca) MG TABS Take 1 tablet by mouth daily.    [provider]  carvedilol (COREG) 3.125 MG tablet Take 1 tablet (3.125 mg total) by mouth 2 (two) times daily with a meal. 01/06/17   Saguier, Percell Miller, PA-C  gabapentin (NEURONTIN) 300 MG capsule Take 1 capsule (300 mg total) by mouth at bedtime. Patient taking differently: Take 300 mg by mouth 3 (three)  times daily.  07/07/16   Domenic Polite, MD  lisinopril (PRINIVIL,ZESTRIL) 5 MG tablet Take 1 tablet (5 mg total) by mouth daily. 01/06/17 02/05/17  Saguier, Percell Miller, PA-C  multivitamin (RENA-VIT) TABS tablet Take 1 tablet by mouth daily.    [provider]  ranitidine (ZANTAC) 150 MG capsule Take 1 capsule (150 mg total) by mouth 2 (two) times daily. 01/06/17   Saguier, Percell Miller, PA-C  rosuvastatin (CRESTOR) 40 MG tablet Take 1 tablet (40 mg total) by mouth daily. 01/06/17   Saguier, Percell Miller, PA-C  venlafaxine XR (EFFEXOR-XR) 75 MG 24 hr capsule Take 1 capsule (75 mg total) by mouth daily with breakfast. 01/06/17   Saguier, Percell Miller, PA-C    Family History Family History  Problem Relation Age of Onset  . Stroke Father        Died at 37  . Diabetes Mother   . Heart attack Brother        Died at 8  . Diabetes Sister   .  Stomach cancer Brother   . Heart attack Sister     Social History Social History   Tobacco Use  . Smoking status: Current Every Day Smoker    Packs/day: 0.50    Years: 30.00    Pack years: 15.00    Types: Cigarettes    Start date: 04/15/1968  . Smokeless tobacco: Never Used  . Tobacco comment: start back a couple of months ago  Substance Use Topics  . Alcohol use: No    Alcohol/week: 0.0 oz    Comment: 09/20/2012 "Used to; stopped ~ 2009; never had problem w/it"  . Drug use: No     Allergies   Patient has no known allergies.   Review of Systems Review of Systems  Psychiatric/Behavioral: Positive for agitation and confusion.  All other systems reviewed and are negative.    Physical Exam Updated Vital Signs BP (!) 158/85   Pulse 67   Temp (!) 97.5 F (36.4 C) (Oral)   Resp 11   SpO2 98%   Physical Exam  Constitutional:  Confused, no obvious facial droop   HENT:  Head: Normocephalic.  Mouth/Throat: Oropharynx is clear and moist.  Eyes: Conjunctivae and EOM are normal. Pupils are equal, round, and reactive to light.  Neck: Normal range of motion. Neck supple.  Cardiovascular: Normal rate, regular rhythm and normal heart sounds.  Pulmonary/Chest: Effort normal and breath sounds normal. No stridor. No respiratory distress.  Abdominal: Soft. Bowel sounds are normal.  Musculoskeletal: Normal range of motion.  Neurological:  Alert, confused. Moving all extremities   Skin: Skin is warm.  Psychiatric: He has a normal mood and affect.  Nursing note and vitals reviewed.    ED Treatments / Results  Labs (all labs ordered are listed, but only abnormal results are displayed) Labs Reviewed  APTT - Abnormal; Notable for the following components:      Result Value   aPTT 55 (*)    All other components within normal limits  CBC - Abnormal; Notable for the following components:   RDW 17.9 (*)    All other components within normal limits  COMPREHENSIVE METABOLIC  PANEL - Abnormal; Notable for the following components:   Glucose, Bld 108 (*)    BUN 41 (*)    Creatinine, Ser 9.89 (*)    Total Protein 8.8 (*)    AST 13 (*)    ALT 12 (*)    GFR calc non Af Amer 5 (*)    GFR calc  Af Amer 5 (*)    All other components within normal limits  I-STAT CHEM 8, ED - Abnormal; Notable for the following components:   BUN 39 (*)    Creatinine, Ser 10.30 (*)    Glucose, Bld 108 (*)    Calcium, Ion 1.03 (*)    All other components within normal limits  ETHANOL  PROTIME-INR  DIFFERENTIAL  RAPID URINE DRUG SCREEN, HOSP PERFORMED  URINALYSIS, ROUTINE W REFLEX MICROSCOPIC  CBG MONITORING, ED  I-STAT TROPONIN, ED    EKG  EKG Interpretation  Date/Time:  Tuesday March 10 2017 14:46:01 EST Ventricular Rate:  80 PR Interval:    QRS Duration: 112 QT Interval:  457 QTC Calculation: 528 R Axis:   21 Text Interpretation:  Sinus rhythm Probable left ventricular hypertrophy Prolonged QT interval Confirmed by Orlie Dakin [phone]) on 03/10/2017 2:50:21 PM       Radiology Ct Head Wo Contrast  Result Date: 03/10/2017 CLINICAL DATA:  Altered mental status for 2 days. EXAM: CT HEAD WITHOUT CONTRAST TECHNIQUE: Contiguous axial images were obtained from the base of the skull through the vertex without intravenous contrast. COMPARISON:  02/01/2017 FINDINGS: Brain: Left cerebral hemisphere subdural blood has resolved from the previous examination. However, there is a new extra-axial low-density collection adjacent to the inferior right cerebellar hemisphere. This is seen on sequence 3, image 7 and best seen on the coronal reformats, sequence 5, image 50. This low-density structure is mildly heterogeneous. This structure measures 2.6 x 1.0 x 0.7 cm. There is no significant mass effect from this new extra-axial collection. No evidence for midline shift, hydrocephalus or large infarct. Again noted is extensive white matter disease with old ischemic changes and remote  lacune infarcts in the deep white matter tracts and deep gray nuclei. Vascular: No hyperdense vessel or unexpected calcification. Skull: Normal. Negative for fracture or focal lesion. Sinuses/Orbits: No acute finding. Other: None. IMPRESSION: New small subdural collection in the right posterior fossa. Findings are suggestive for a subacute subdural hematoma or hygroma. The left cerebral hemisphere subdural hematoma has resolved since 02/01/2017. Chronic white matter changes compatible with small vessel ischemic disease and remote lacune infarcts. Electronically Signed   By: Markus Daft M.D.   On: 03/10/2017 15:46   Dg Chest Port 1 View  Result Date: 03/10/2017 CLINICAL DATA:  Altered mental status EXAM: PORTABLE CHEST 1 VIEW COMPARISON:  12/22/2016 FINDINGS: Mild cardiomegaly. No focal consolidation or effusion. Mild aortic atherosclerosis. No pneumothorax. IMPRESSION: Cardiomegaly.  Negative for edema or infiltrate. Electronically Signed   By: Donavan Foil M.D.   On: 03/10/2017 16:21    Procedures Procedures (including critical care time)  CRITICAL CARE Performed by: Wandra Arthurs   Total critical care time: 30 minutes  Critical care time was exclusive of separately billable procedures and treating other patients.  Critical care was necessary to treat or prevent imminent or life-threatening deterioration.  Critical care was time spent personally by me on the following activities: development of treatment plan with patient and/or surrogate as well as nursing, discussions with consultants, evaluation of patient's response to treatment, examination of patient, obtaining history from patient or surrogate, ordering and performing treatments and interventions, ordering and review of laboratory studies, ordering and review of radiographic studies, pulse oximetry and re-evaluation of patient's condition.   Medications Ordered in ED Medications - No data to display   Initial Impression / Assessment  and Plan / ED Course  I have reviewed the triage vital signs and the nursing  notes.  Pertinent labs & imaging results that were available during my care of the patient were reviewed by me and considered in my medical decision making (see chart for details).     [name] is a 71 y.o. male here with confusion, AMS. Consider electrolyte abnormality vs stroke vs polypharmacy. Will get CT head, labs, CXR.   5:13 PM CT head showed subacute subdural. I called Dr. Irven Baltimore PA who reviewed images. He doesn't think subdural is causing his AMS. Recommend medicine admission. Hospitalist to admit. Dr. Annette Stable will see patient as consult. Labs at baseline, not hyperkalemic.   Final Clinical Impressions(s) / ED Diagnoses   Final diagnoses:  None    ED Discharge Orders    None       Drenda Freeze, MD 03/10/17 [phone]

## 2017-03-10 NOTE — Progress Notes (Signed)
NEUROSURGERY PROGRESS NOTE  Called regarding this 71 year old male for altered mental status since 10am this morning. Was apparently given haldol and versed after patient became combative. CT showed a right posterior fossa extra axial hypodensity with no midline shift. I do not believe that this is the source of his confusion though. Would suggest medicine to admit him and work him up further for other sources of confusion. Would also suggest repeating a head CT in a couple days as well as a couple week to make sure it is stable. No neurosurgical interventions needed at this time but can call if we can be of further assistance.   Temp:  [97.5 F (36.4 C)] 97.5 F (36.4 C) (12/25 1448) Pulse Rate:  [67-79] 67 (12/25 1545) Resp:  [11-12] 11 (12/25 1545) BP: (139-158)/(75-89) 158/85 (12/25 1545) SpO2:  [95 %-99 %] 98 % (12/25 1545)  Eleonore Chiquito, NP 03/10/2017 4:44 PM

## 2017-03-10 NOTE — Consult Note (Addendum)
Neurology Consultation  Reason for Consult: Altered mental status Referring Physician: Dr. Maryland Pink  CC: Altered mental status  History is obtained from: Chart  HPI: [name] is a 71 y.o. 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  There is no family member at bedside to provide history.  According to history obtained from the chart and other providers, he has been agitated off and on ever since he had the subdurals.  This morning around 10 AM, he got very agitated and difficult to handle at home and that is why EMS was called and he was brought into the emergency room.  He was also very aggressive towards EMS staff and received Versed and Haldol.  Following administration of Versed and Haldol, he became much more calm and at the time of this encounter, he cooperated completely with the exam and seemed oriented to time place and person.  Noncontrast CT of the head was performed at the emergency room at [facility] today which showed a possible new subacute subdural hematoma in the right posterior fossa and resolved left convexity subdural hematoma. He is due for his dialysis.  Current creatinine is over 10.  He has also been hostile towards staff at the dialysis center per report. Although the patient appeared oriented to time place and person, he was unable to provide a coherent history.  He does know he is in the hospital but does not know exactly for what reason.  He has been evaluated by outpatient neurology for follow-up of his stroke as well  as progressive memory decline and cognitive impairment.  An EEG was performed as an outpatient on 01/23/2017 which was reportedly normal.  B12 levels were also in the normal range and so was TSH.  Brain MRI scan was performed on 01/20/2017 that showed abnormalities including moderate to severe perisylvian and mild/moderate periventricular subcortical and pontine chronic small vessel ischemic disease, multiple supratentorial and infratentorial chronic cerebral hemorrhages, left caudate and left perirolandic sulcal hemosiderin deposition.   JSE:GBTDVV to obtain due to altered mental status.   Past Medical History:  Diagnosis Date  . 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 Anemia, iron deficiency 03/24/2011   a. Recurrent GI bleed, tx with periodic iron infusions.  . 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 AVM (arteriovenous malformation) of duodenum, acquired    egds in 01/2012, 04/2011  . BPH (benign prostatic hyperplasia)   . CAD (coronary artery disease)    a. Cath 09/2012: moderate borderline CAD in mid LAD/small diagonal branch, mild RCA stenosis, to be managed medically   . Chronic lower back pain   . Diabetic peripheral neuropathy ([place]) 2014   foot pain.  . Essential hypertension 02/01/2012  . Fatty liver    on CT of 11/2010  . GERD (gastroesophageal reflux disease)   . GI bleed    a. Recurrent GI bleed, tx with IV iron. b. Per heme notes - likely AVMs 04/2012 (tx with cauterization several months ago).  . Hematuria    a. Urology note scan from 07/2012: cystoscopy without evidence for bladder lesion, only lateral hypertrophy of  posterior urethra, bladder impression from BPH. b. Pt states he had "some tests" scheduled for later in July 2014.  . High cholesterol   . Hyperlipidemia 02/01/2012  . Hypertension   . Hypertensive urgency 07/06/2016  . 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 Orthostatic hypotension    a. Tx with florinef.  . Peripheral neuropathy   . Polyp,  colonic    Colonoscopy 01/2012 "benign" polyp  . Prostate cancer ([place])   . Sleep apnea    "had mask; couldn't sleep in it" (09/20/2012)  . Smoker   . SOB (shortness of breath) 07/06/2016  . Stage III chronic kidney disease ([name])    a. Stage 3 (DM with complications ->CKD, peripheral neuropathy).  . Stroke ([place])    x 2  . Symptomatic anemia 04/03/2015  . Tinea pedis 05/24/2012  . Tobacco use 02/01/2012  . Type 2 diabetes, uncontrolled, with neuropathy ([place]) 02/01/2012  . Type II diabetes mellitus ([place])    a. Dx 1994, uncontrolled.     Family History  Problem Relation Age of Onset  . Stroke Father        Died at 64  . Diabetes Mother   . Heart attack Brother        Died at 68  . Diabetes Sister   . Stomach cancer Brother   . Heart attack Sister    Social History:   reports that he has been smoking cigarettes.  He started smoking about 48 years ago. He has a 15.00 pack-year smoking history. he has never used smokeless tobacco. He reports that he does not drink alcohol or use drugs.  Medications No current facility-administered medications for this encounter.   Current Outpatient Medications:  .  Calcium Acetate 668 (169 Ca) MG TABS, Take 1 tablet by mouth daily., Disp: , Rfl:  .  carvedilol (COREG) 3.125 MG tablet, Take 1 tablet (3.125 mg total) by mouth 2 (two) times daily with a meal., Disp: 180 tablet, Rfl: 1 .  gabapentin (NEURONTIN) 300 MG capsule, Take 1 capsule (300 mg total) by mouth at bedtime. (Patient taking differently: Take 300 mg by mouth 3 (three) times daily. ), Disp: 30 capsule, Rfl: 0 .  lisinopril (PRINIVIL,ZESTRIL) 5 MG tablet, Take 1 tablet (5 mg total) by mouth daily., Disp: 90 tablet, Rfl: 1 .  multivitamin (RENA-VIT) TABS tablet, Take 1 tablet by mouth daily., Disp: , Rfl:  .  ranitidine (ZANTAC) 150 MG capsule, Take 1 capsule (150 mg total) by mouth 2 (two) times daily., Disp: 180 capsule, Rfl: 1 .  rosuvastatin (CRESTOR) 40 MG tablet, Take 1 tablet (40 mg  total) by mouth daily., Disp: 90 tablet, Rfl: 1 .  venlafaxine XR (EFFEXOR-XR) 75 MG 24 hr capsule, Take 1 capsule (75 mg total) by mouth daily with breakfast., Disp: 30 capsule, Rfl: 3  Exam: Current vital signs: BP (!) 158/85   Pulse 67   Temp (!) 97.5 F (36.4 C) (Oral)   Resp 11   SpO2 98%  Vital signs in last 24 hours: Temp:  [97.5 F (36.4 C)] 97.5 F (36.4 C) (12/25 1448) Pulse Rate:  [67-79] 67 (12/25 1545) Resp:  [11-12] 11 (12/25 1545) BP: (139-158)/(75-89) 158/85 (12/25 1545) SpO2:  [95 %-99 %] 98 % (12/25 1545)  GENERAL: Awake, alert in NAD HEENT: - Normocephalic and atraumatic, dry mm, no LN++, no Thyromegally LUNGS - Clear to auscultation bilaterally with no wheezes CV - S1S2 RRR, no m/r/g, equal pulses bilaterally. ABDOMEN - Soft, nontender, nondistended with  normoactive BS Ext: warm, well perfused, intact peripheral pulses, no edema NEURO:  Mental Status: AA& oriented to time place and person.  Was able to tell me his name.  Month date and the fact that it is Christmas day today along with the fact that he is in [facility] in [place]. Language: speech is dysarthric.  Naming, repetition, fluency, and comprehension intact. Cranial Nerves: PERRL. EOMI, visual fields full, no facial asymmetry, facial sensation intact, hearing intact, tongue/uvula/soft palate midline, normal sternocleidomastoid and trapezius muscle strength. No evidence of tongue atrophy or fibrillations Motor: Symmetric antigravity in all 4 extremities with normal tone and range of motion in the left but slightly increased tone in the right. Sensation- Intact to light touch bilaterally Coordination: FTN intact bilaterally, no ataxia in BLE. Gait- deferred  Labs I have reviewed labs in epic and the results pertinent to this consultation are: CBC    Component Value Date/Time   WBC 4.8 03/10/2017 1506   RBC 4.38 03/10/2017 1506   HGB 15.0 03/10/2017 1519   HGB 10.8 (L) 11/06/2016 1303    HCT 44.0 03/10/2017 1519   HCT 32.6 (L) 11/06/2016 1303   PLT 245 03/10/2017 1506   PLT 258 11/06/2016 1303   MCV 92.2 03/10/2017 1506   MCV 95 11/06/2016 1303   MCH 31.1 03/10/2017 1506   MCHC 33.7 03/10/2017 1506   RDW 17.9 (H) 03/10/2017 1506   RDW 17.1 (H) 11/06/2016 1303   LYMPHSABS 1.0 03/10/2017 1506   LYMPHSABS 1.0 11/06/2016 1303   MONOABS 0.5 03/10/2017 1506   EOSABS 0.2 03/10/2017 1506   EOSABS 0.2 11/06/2016 1303   BASOSABS 0.0 03/10/2017 1506   BASOSABS 0.0 11/06/2016 1303    CMP     Component Value Date/Time   NA 142 03/10/2017 1519   NA 138 11/06/2016 1303   NA 137 10/16/2016 1257   NA 141 11/15/2015 0917   K 3.9 03/10/2017 1519   K 4.8 11/06/2016 1303   K 4.3 10/16/2016 1257   K 4.0 11/15/2015 0917   CL 104 03/10/2017 1519   CL 100 11/06/2016 1303   CL 97 (L) 10/16/2016 1257   CO2 23 03/10/2017 1506   CO2 30 (H) 11/06/2016 1303   CO2 31 10/16/2016 1257   CO2 27 11/15/2015 0917   GLUCOSE 108 (H) 03/10/2017 1519   GLUCOSE 149 (H) 10/16/2016 1257   BUN 39 (H) 03/10/2017 1519   BUN 18 11/06/2016 1303   BUN 26 (H) 10/16/2016 1257   BUN 18.7 11/15/2015 0917   CREATININE 10.30 (H) 03/10/2017 1519   CREATININE 6.56 (H) 11/06/2016 1303   CREATININE 8.4 (HH) 10/16/2016 1257   CREATININE 5.3 (HH) 11/15/2015 0917   CALCIUM 9.4 03/10/2017 1506   CALCIUM 9.0 11/06/2016 1303   CALCIUM 8.0 10/16/2016 1257   CALCIUM 8.9 11/15/2015 0917   PROT 8.8 (H) 03/10/2017 1506   PROT 7.7 11/06/2016 1303   PROT 7.2 10/16/2016 1257   PROT 7.2 11/15/2015 0917   ALBUMIN 4.2 03/10/2017 1506   ALBUMIN 4.0 11/06/2016 1303   ALBUMIN 2.7 (L) 11/15/2015 0917   AST 13 (L) 03/10/2017 1506   AST 8 11/06/2016 1303   AST 16 10/16/2016 1257   AST 12 11/15/2015 0917   ALT 12 (L) 03/10/2017 1506   ALT 6 11/06/2016 1303   ALT 15 10/16/2016 1257   ALT <9 11/15/2015 0917   ALKPHOS 108 03/10/2017 1506   ALKPHOS 113 11/06/2016 1303   ALKPHOS 98 (H) 10/16/2016 1257  ALKPHOS 117  11/15/2015 0917   BILITOT 0.8 03/10/2017 1506   BILITOT 0.3 11/06/2016 1303   BILITOT 0.50 10/16/2016 1257   BILITOT <0.30 11/15/2015 0917   GFRNONAA 5 (L) 03/10/2017 1506   GFRAA 5 (L) 03/10/2017 1506    Lipid Panel     Component Value Date/Time   CHOL 184 01/06/2017 0927   TRIG 191.0 (H) 01/06/2017 0927   HDL 33.20 (L) 01/06/2017 0927   CHOLHDL 6 01/06/2017 0927   VLDL 38.2 01/06/2017 0927   LDLCALC 113 (H) 01/06/2017 0927   LDLDIRECT 117.0 09/25/2014 0908     Imaging I have reviewed the images obtained: CT-scan of the brain -subacute left posterior fossa subdural which was not present on the prior scan.  Stable chronic white matter disease MRI examination of the brain from November 2018 showed no acute strokes, but was significant for moderate to severe perisylvian and mild mesial temporal atrophy along with moderate periventricular subcortical and pontine chronic small vessel ischemic disease.  There are multiple supratentorial and infratentorial chronic cerebral microhemorrhages and also left caudate and left perirolandic sulcal hemosiderin deposition was noted.  Assessment:  71 year old man past history of ESRD on dialysis, coronary artery disease, diabetes, hypertension hyperlipidemia prostate cancer, sleep apnea, lacunar stroke in March 2018 with residual right-sided weakness, recent subdural on the left cerebral convexity that has resolved, presenting for evaluation of what sounds like progressive memory issues and cognitive decline as well as intermittent episodes of delirium and agitation. Looking at his current labs, he has deranged renal function, secondary to his ESRD which could be contributing to this acute toxic metabolic encephalopathy on top of underlying cognitive impairment from his chronic small vessel disease. His MRI is grossly abnormal from November showing accelerated chronic white matter disease as well as numerous microhemorrhages all over his brain  indicating uncontrolled hypertension. For today's episode, after receiving Versed and Haldol his demeanor improved and his clinical exam was also much better.  This is highly suspicious for underlying seizure activity  Impression: Evaluate for seizures secondary to underlying cognitive impairment/dementia Toxic metabolic encephalopathy Underlying cognitive impairment secondary to possible vascular dementia versus other causes of dementia  Recommendations: I would recommend checking B12 TSH I would recommend obtaining an MRI of the brain  without contrast (ESRD patient, cannot do with contrast) to evaluate for any new strokes or other lesions. Routine EEG in the morning Consider starting antiepileptics-Keppra or Depakote (which might be better than Keppra in his case-given his episodes of aggression and agitation and range renal function) Seizure precautions Correction of toxic metabolic derangements per primary team as you are Neurology will follow with you and provide further recommendations based on test results and clinical course.  -- Amie Portland, MD [facility] [phone] If 7pm to 7am, please call on call as listed on AMION.

## 2017-03-10 NOTE — ED Notes (Signed)
Admitting at bedside 

## 2017-03-10 NOTE — ED Notes (Signed)
Patient made aware of need for urine sample.  States he does not create urine every day.  Urinal provided just in case.

## 2017-03-10 NOTE — ED Notes (Signed)
Attempted report x 2 

## 2017-03-10 NOTE — ED Notes (Signed)
Pt transported to CT ?

## 2017-03-10 NOTE — ED Notes (Addendum)
Went to start Depacon and do a neuro assessment.  Pt's speech now less cohesive, not able to speak in full sentences.  Becoming more agitated.  Still answering orientation questions appropriately.  Have paged admitting.

## 2017-03-10 NOTE — H&P (Signed)
[facility] History and Physical  [name] UEA:[id number] DOB: 1945-07-01 DOA: 03/10/2017   PCP: Mackie Pai, PA-C  Specialists: Dr. Olivia Mackie is his nephrologist.  Chief Complaint: Agitation, confusion  HPI: [name] is a 71 y.o. male with a past medical history of end-stage renal disease on hemodialysis Monday Wednesday Friday, history of diabetes for which he is no longer on any glucose lowering agents, essential hypertension, chronic systolic CHF, coronary artery disease on medical management, history of GI bleed, history of subdural hematoma, previous history of stroke who came home from short-term rehab last Friday.  Patient apparently had a fall before Thanksgiving resulting in facial fractures as well as subdural hematoma.  He was admitted to [facility].  He was there for about 7-8 days.  Did not require any surgical intervention.  He was discharged to skilled nursing facility for rehab.  And then he came home last Friday.  Since then patient's wife has noticed that patient has been confused and has periods of agitation.  He would refuse to cooperate with home health nurse.  He was also being uncooperative with dialysis staff.  And at times he will not get up from the bed.  She was very concerned this morning and so she decided to call EMS who brought him to the hospital.  He was apparently very combative with EMS.  He had to be given Haldol and Versed with which he did calm down.  Patient does not know why he is in the hospital.  He denies any pain.  Denies any headaches.  No visual disturbances.  Denies any shortness of breath or chest pain.  His wife had already gone home.  I did discuss all of the above with her over the phone.  No fever no chills recently.  He makes urine only very occasionally.  Denies any recent changes to his medications except for increase in dose of Effexor a few weeks ago.  But she also mentioned that because of his confusion and agitation  he is not been taking his medications regularly.  In the emergency department patient underwent a CT scan of the head which raised concern for right posterior fossa subdural hematoma.  Neurosurgery was consulted.    Home Medications: Prior to Admission medications   Medication Sig Start Date End Date Taking? Authorizing Provider  Calcium Acetate 668 (169 Ca) MG TABS Take 1 tablet by mouth daily.    [provider]  carvedilol (COREG) 3.125 MG tablet Take 1 tablet (3.125 mg total) by mouth 2 (two) times daily with a meal. 01/06/17   Saguier, Percell Miller, PA-C  gabapentin (NEURONTIN) 300 MG capsule Take 1 capsule (300 mg total) by mouth at bedtime. Patient taking differently: Take 300 mg by mouth 3 (three) times daily.  07/07/16   Domenic Polite, MD  lisinopril (PRINIVIL,ZESTRIL) 5 MG tablet Take 1 tablet (5 mg total) by mouth daily. 01/06/17 02/05/17  Saguier, Percell Miller, PA-C  multivitamin (RENA-VIT) TABS tablet Take 1 tablet by mouth daily.    [provider]  ranitidine (ZANTAC) 150 MG capsule Take 1 capsule (150 mg total) by mouth 2 (two) times daily. 01/06/17   Saguier, Percell Miller, PA-C  rosuvastatin (CRESTOR) 40 MG tablet Take 1 tablet (40 mg total) by mouth daily. 01/06/17   Saguier, Percell Miller, PA-C  venlafaxine XR (EFFEXOR-XR) 75 MG 24 hr capsule Take 1 capsule (75 mg total) by mouth daily with breakfast. 01/06/17   Saguier, Percell Miller, PA-C    Allergies: No Known Allergies  Past Medical History: Past Medical History:  Diagnosis Date  . 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 Anemia, iron deficiency 03/24/2011   a. Recurrent GI bleed, tx with periodic iron infusions.  . 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 AVM (arteriovenous malformation) of duodenum, acquired    egds in 01/2012, 04/2011  . BPH (benign prostatic hyperplasia)   . CAD (coronary artery disease)    a. Cath 09/2012: moderate borderline CAD in mid LAD/small diagonal branch, mild RCA stenosis, to be managed medically    . Chronic lower back pain   . Diabetic peripheral neuropathy ([place]) 2014   foot pain.  . Essential hypertension 02/01/2012  . Fatty liver    on CT of 11/2010  . GERD (gastroesophageal reflux disease)   . GI bleed    a. Recurrent GI bleed, tx with IV iron. b. Per heme notes - likely AVMs 04/2012 (tx with cauterization several months ago).  . Hematuria    a. Urology note scan from 07/2012: cystoscopy without evidence for bladder lesion, only lateral hypertrophy of posterior urethra, bladder impression from BPH. b. Pt states he had "some tests" scheduled for later in July 2014.  . High cholesterol   . Hyperlipidemia 02/01/2012  . Hypertension   . Hypertensive urgency 07/06/2016  . 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 Orthostatic hypotension    a. Tx with florinef.  . Peripheral neuropathy   . Polyp, colonic    Colonoscopy 01/2012 "benign" polyp  . Prostate cancer ([place])   . Sleep apnea    "had mask; couldn't sleep in it" (09/20/2012)  . Smoker   . SOB (shortness of breath) 07/06/2016  . Stage III chronic kidney disease ([name])    a. Stage 3 (DM with complications ->CKD, peripheral neuropathy).  . Stroke ([place])    x 2  . Symptomatic anemia 04/03/2015  . Tinea pedis 05/24/2012  . Tobacco use 02/01/2012  . Type 2 diabetes, uncontrolled, with neuropathy ([place]) 02/01/2012  . Type II diabetes mellitus ([place])    a. Dx 1994, uncontrolled.     Past Surgical History:  Procedure Laterality Date  . ENTEROSCOPY N/A 03/01/2013   Procedure: ENTEROSCOPY;  Surgeon: Inda Castle, MD;  Location: [facility];  Service: Endoscopy;  Laterality: N/A;  . ENTEROSCOPY N/A 06/26/2016   Procedure: ENTEROSCOPY;  Surgeon: Milus Banister, MD;  Location: [place];  Service: Endoscopy;  Laterality: N/A;  this is a push enteroscopy  . ENTEROSCOPY N/A 10/23/2016   Procedure: ENTEROSCOPY;  Surgeon: Gatha Mayer, MD;  Location[facility] ENDOSCOPY;  Service: Endoscopy;  Laterality: N/A;  . GIVENS CAPSULE  STUDY N/A 04/05/2015   Procedure: GIVENS CAPSULE STUDY;  Surgeon: Gatha Mayer, MD;  Location: [place];  Service: Endoscopy;  Laterality: N/A;  . INGUINAL HERNIA REPAIR Right 2011  . LEFT HEART CATH AND CORONARY ANGIOGRAPHY N/A 12/23/2016   Procedure: LEFT HEART CATH AND CORONARY ANGIOGRAPHY;  Surgeon: Leonie Man, MD;  Location: [place] CV LAB;  Service: Cardiovascular;  Laterality: N/A;  . LEFT HEART CATHETERIZATION WITH CORONARY ANGIOGRAM N/A 09/21/2012   Procedure: LEFT HEART CATHETERIZATION WITH CORONARY ANGIOGRAM;  Surgeon: Peter M Martinique, MD;  Location: [facility] CATH LAB;  Service: Cardiovascular;  Laterality: N/A;  . LUMBAR DISC SURGERY  1980's  . LYMPHADENECTOMY Bilateral 01/19/2013   Procedure: LYMPHADENECTOMY;  Surgeon: Bernestine Amass, MD;  Location: [facility];  Service: Urology;  Laterality: Bilateral;  . ROBOT ASSISTED LAPAROSCOPIC RADICAL PROSTATECTOMY N/A 01/19/2013   Procedure: ROBOTIC  ASSISTED LAPAROSCOPIC RADICAL PROSTATECTOMY;  Surgeon: Bernestine Amass, MD;  Location: [facility];  Service: Urology;  Laterality: N/A;  . SHOULDER OPEN ROTATOR CUFF REPAIR Left 1980's    Social History: Patient lives with his wife.  No history of smoking alcohol use or illicit drug use.  He states that he uses a cane to ambulate.  Family History:  Family History  Problem Relation Age of Onset  . Stroke Father        Died at 35  . Diabetes Mother   . Heart attack Brother        Died at 33  . Diabetes Sister   . Stomach cancer Brother   . Heart attack Sister      Review of Systems -difficult to do due to his confusion  Physical Examination  Vitals:   03/10/17 1507 03/10/17 1515 03/10/17 1530 03/10/17 1545  BP:  (!) 142/85 (!) 157/84 (!) 158/85  Pulse:  75 70 67  Resp:  11 11 11   Temp:      TempSrc:      SpO2: 95% 98% 98% 98%    BP (!) 158/85   Pulse 67   Temp (!) 97.5 F (36.4 C) (Oral)   Resp 11   SpO2 98%   General appearance: alert, cooperative, appears stated age,  distracted and no distress Head: Normocephalic, without obvious abnormality, atraumatic Eyes: conjunctivae/corneas clear. PERRL, EOM's intact.  Throat: lips, mucosa, and tongue normal; teeth and gums normal Neck: no adenopathy, no carotid bruit, no JVD, supple, symmetrical, trachea midline and thyroid not enlarged, symmetric, no tenderness/mass/nodules Resp: clear to auscultation bilaterally Cardio: regular rate and rhythm, S1, S2 normal, no murmur, click, rub or gallop GI: soft, non-tender; bowel sounds normal; no masses,  no organomegaly Extremities: extremities normal, atraumatic, no cyanosis or edema Pulses: 2+ and symmetric Skin: Skin color, texture, turgor normal. No rashes or lesions Lymph nodes: Cervical, supraclavicular, and axillary nodes normal. Neurologic: Awake alert.  Oriented to city, year or month.  Person.  No facial asymmetry.  Tongue is midline.  No obvious cranial nerve deficits.  Motor strength equal bilateral upper and lower extremities.  No pronator drift.  Finger to nose normal.   Labs on Admission: I have personally reviewed following labs and imaging studies  CBC: Recent Labs  Lab 03/10/17 1506 03/10/17 1519  WBC 4.8  --   NEUTROABS 3.2  --   HGB 13.6 15.0  HCT 40.4 44.0  MCV 92.2  --   PLT 245  --    Basic Metabolic Panel: Recent Labs  Lab 03/10/17 1506 03/10/17 1519  NA 139 142  K 3.9 3.9  CL 101 104  CO2 23  --   GLUCOSE 108* 108*  BUN 41* 39*  CREATININE 9.89* 10.30*  CALCIUM 9.4  --    GFR: CrCl cannot be calculated (Unknown ideal weight.). Liver Function Tests: Recent Labs  Lab 03/10/17 1506  AST 13*  ALT 12*  ALKPHOS 108  BILITOT 0.8  PROT 8.8*  ALBUMIN 4.2   Coagulation Profile: Recent Labs  Lab 03/10/17 1506  INR 1.05   BNP (last 3 results) Recent Labs    07/09/16 1438 07/31/16 1104 08/19/16 1155  PROBNP 1,044.0* 343.0* 349.0*   CBG: Recent Labs  Lab 03/10/17 1453  GLUCAP 92     Radiological Exams on  Admission: Ct Head Wo Contrast  Result Date: 03/10/2017 CLINICAL DATA:  Altered mental status for 2 days. EXAM: CT HEAD WITHOUT CONTRAST TECHNIQUE: Contiguous  axial images were obtained from the base of the skull through the vertex without intravenous contrast. COMPARISON:  02/01/2017 FINDINGS: Brain: Left cerebral hemisphere subdural blood has resolved from the previous examination. However, there is a new extra-axial low-density collection adjacent to the inferior right cerebellar hemisphere. This is seen on sequence 3, image 7 and best seen on the coronal reformats, sequence 5, image 50. This low-density structure is mildly heterogeneous. This structure measures 2.6 x 1.0 x 0.7 cm. There is no significant mass effect from this new extra-axial collection. No evidence for midline shift, hydrocephalus or large infarct. Again noted is extensive white matter disease with old ischemic changes and remote lacune infarcts in the deep white matter tracts and deep gray nuclei. Vascular: No hyperdense vessel or unexpected calcification. Skull: Normal. Negative for fracture or focal lesion. Sinuses/Orbits: No acute finding. Other: None. IMPRESSION: New small subdural collection in the right posterior fossa. Findings are suggestive for a subacute subdural hematoma or hygroma. The left cerebral hemisphere subdural hematoma has resolved since 02/01/2017. Chronic white matter changes compatible with small vessel ischemic disease and remote lacune infarcts. Electronically Signed   By: Markus Daft M.D.   On: 03/10/2017 15:46   Dg Chest Port 1 View  Result Date: 03/10/2017 CLINICAL DATA:  Altered mental status EXAM: PORTABLE CHEST 1 VIEW COMPARISON:  12/22/2016 FINDINGS: Mild cardiomegaly. No focal consolidation or effusion. Mild aortic atherosclerosis. No pneumothorax. IMPRESSION: Cardiomegaly.  Negative for edema or infiltrate. Electronically Signed   By: Donavan Foil M.D.   On: 03/10/2017 16:21    My interpretation of  Electrocardiogram: Sinus rhythm in the 80s.  Normal axis.  QT prolongation is noted.  LVH is present.  No ischemic changes noted.   Problem List  Principal Problem:   Acute encephalopathy Active Problems:   ESRD (end stage renal disease) ([name])   Subdural hematoma ([name])   Assessment: This is a 71 year old African-American male with past medical history as stated earlier who presents with a few day history of periodic agitation, confusion and combative behavior.  Patient is found to have an age-indeterminate subdural hematoma.  Unlikely this is causing his symptoms.  It is more likely that the patient has some form of dementia.  He has had strokes before.  This could be vascular dementia.  No obvious infectious etiology has been found.  Patient seen recently by a neurologist, Dr. Leonie Man.  Patient underwent dementia workup which apparently was unremarkable.  Plan: #1 acute encephalopathy: Could be metabolic.  Patient does have end-stage renal disease.  No infectious etiology found.  Unlikely the abnormal finding on the CT is contributing to his confusion.  He likely has underlying dementia as discussed above.  Discussed with neurology who will consult on this patient.  They recommend MRI, EEG.  We will also check B12 level TSH RPR HIV.  Ammonia level.  PT and OT evaluation.  Neurochecks for tonight.  Patient is noted to have QT prolongation so we will hold off on Haldol.  Repeat EKG tomorrow.  Ativan as needed for now. Thiamine.  #2 abnormal CT head with suggestion of subdural hematoma: According to neurology this appears to be more of a hygroma.  Patient was hospitalized to [facility] in November after he sustained a fall.  At that time he had a left-sided subdural hematoma which appears to have resolved.  Brief note from neurosurgery suggest that there is no surgical intervention.  #3  End-stage renal disease on hemodialysis on Monday.  Last dialysis  was on Sunday due to holiday  schedule.  Nephrology has been notified.  He has dialysis access(fistula) in his left arm.  #4 history of diabetes mellitus type 2, currently diet controlled: Used to be on insulin and other agents previously but since he has been on dialysis he has been taken off of these medications.  Glucose level here is 108.  Monitor every day.  #5  History of essential hypertension: Continue with his home medications.  #6  Chronic systolic CHF/CAD: Based on echocardiogram from October his ejection fraction is 20-25%.  Volume status is managed with hemodialysis.  Patient underwent cardiac catheterization in October when he was hospitalized here.  He was found to have mid LAD lesion first diagonal and mid RCA lesions. He was seen by cardiothoracic surgery.  Medical therapy was recommended.   His Plavix is on hold due to recent subdural hematoma.  Apparently also has a history of GI bleed.  DVT Prophylaxis: SCDs Code Status: Full code Family Communication: Discussed with his wife Consults called: Neurosurgery called by ED provider.  Neurology and nephrology consulted by self.  Severity of Illness: The appropriate patient status for this patient is INPATIENT. Inpatient status is judged to be reasonable and necessary in order to provide the required intensity of service to ensure the patient's safety. The patient's presenting symptoms, physical exam findings, and initial radiographic and laboratory data in the context of their chronic comorbidities is felt to place them at high risk for further clinical deterioration. Furthermore, it is not anticipated that the patient will be medically stable for discharge from the hospital within 2 midnights of admission. The following factors support the patient status of inpatient.   " The patient's presenting symptoms include altered mental status, agitation. " The worrisome physical exam findings include disoriented, high blood pressure. " The initial radiographic and  laboratory data are worrisome because of subdural hematoma. " The chronic co-morbidities include end-stage renal disease.   * I certify that at the point of admission it is my clinical judgment that the patient will require inpatient hospital care spanning beyond 2 midnights from the point of admission due to high intensity of service, high risk for further deterioration and high frequency of surveillance required.*  Further management decisions will depend on results of further testing and patient's response to treatment.   Bonnielee Haff  [facility] Pager [phone]  If 7PM-7AM, please contact night-coverage [email or web address] Password [facility]  03/10/2017, 6:22 PM

## 2017-03-10 NOTE — ED Notes (Signed)
Attempted report x1. 

## 2017-03-11 ENCOUNTER — Inpatient Hospital Stay (HOSPITAL_COMMUNITY): Payer: Medicare Other

## 2017-03-11 DIAGNOSIS — G9340 Encephalopathy, unspecified: Secondary | ICD-10-CM

## 2017-03-11 DIAGNOSIS — I5022 Chronic systolic (congestive) heart failure: Secondary | ICD-10-CM

## 2017-03-11 LAB — COMPREHENSIVE METABOLIC PANEL
ALBUMIN: 3.4 g/dL — AB (ref 3.5–5.0)
ALK PHOS: 97 U/L (ref 38–126)
ALT: 10 U/L — ABNORMAL LOW (ref 17–63)
ANION GAP: 15 (ref 5–15)
AST: 11 U/L — ABNORMAL LOW (ref 15–41)
BILIRUBIN TOTAL: 0.8 mg/dL (ref 0.3–1.2)
BUN: 48 mg/dL — ABNORMAL HIGH (ref 6–20)
CALCIUM: 8.8 mg/dL — AB (ref 8.9–10.3)
CO2: 23 mmol/L (ref 22–32)
Chloride: 102 mmol/L (ref 101–111)
Creatinine, Ser: 10.9 mg/dL — ABNORMAL HIGH (ref 0.61–1.24)
GFR calc non Af Amer: 4 mL/min — ABNORMAL LOW (ref >60)
GFR, EST AFRICAN AMERICAN: 5 mL/min — AB (ref >60)
GLUCOSE: 78 mg/dL (ref 65–99)
Potassium: 3.9 mmol/L (ref 3.5–5.1)
Sodium: 140 mmol/L (ref 135–145)
TOTAL PROTEIN: 7.3 g/dL (ref 6.5–8.1)

## 2017-03-11 LAB — RPR: RPR Ser Ql: NONREACTIVE

## 2017-03-11 LAB — CBC
HCT: 34.5 % — ABNORMAL LOW (ref 39.0–52.0)
HEMOGLOBIN: 11.8 g/dL — AB (ref 13.0–17.0)
MCH: 31 pg (ref 26.0–34.0)
MCHC: 34.2 g/dL (ref 30.0–36.0)
MCV: 90.6 fL (ref 78.0–100.0)
Platelets: 227 10*3/uL (ref 150–400)
RBC: 3.81 MIL/uL — ABNORMAL LOW (ref 4.22–5.81)
RDW: 18.1 % — ABNORMAL HIGH (ref 11.5–15.5)
WBC: 3.8 10*3/uL — ABNORMAL LOW (ref 4.0–10.5)

## 2017-03-11 LAB — MAGNESIUM: MAGNESIUM: 1.9 mg/dL (ref 1.7–2.4)

## 2017-03-11 LAB — TSH: TSH: 0.417 u[IU]/mL (ref 0.350–4.500)

## 2017-03-11 LAB — HIV ANTIBODY (ROUTINE TESTING W REFLEX): HIV SCREEN 4TH GENERATION: NONREACTIVE

## 2017-03-11 LAB — VITAMIN B12: VITAMIN B 12: 842 pg/mL (ref 180–914)

## 2017-03-11 LAB — MRSA PCR SCREENING: MRSA BY PCR: NEGATIVE

## 2017-03-11 LAB — AMMONIA: Ammonia: 28 umol/L (ref 9–35)

## 2017-03-11 MED ORDER — SODIUM CHLORIDE 0.9 % IV SOLN: 100.0000 mL | INTRAVENOUS | Status: DC | PRN

## 2017-03-11 MED ORDER — ATORVASTATIN CALCIUM 80 MG PO TABS
80.0000 mg | ORAL_TABLET | Freq: Every day | ORAL | Status: DC
  Administered 2017-03-11: 80 mg via ORAL
  Filled 2017-03-11: qty 1

## 2017-03-11 MED ORDER — ROSUVASTATIN CALCIUM 40 MG PO TABS
40.0000 mg | ORAL_TABLET | Freq: Every day | ORAL | Status: DC
Start: 2017-03-11 — End: 2017-03-11

## 2017-03-11 MED ORDER — VITAMIN B-1 100 MG PO TABS: 100.0000 mg | ORAL_TABLET | Freq: Every day | ORAL | Status: DC

## 2017-03-11 MED ORDER — PENTAFLUOROPROP-TETRAFLUOROETH EX AERO: 1.0000 "application " | INHALATION_SPRAY | CUTANEOUS | Status: DC | PRN

## 2017-03-11 MED ORDER — CARVEDILOL 3.125 MG PO TABS
3.1250 mg | ORAL_TABLET | Freq: Two times a day (BID) | ORAL | Status: DC
  Administered 2017-03-11 – 2017-03-12 (×2): 3.125 mg via ORAL
  Filled 2017-03-11 (×2): qty 1

## 2017-03-11 MED ORDER — SODIUM CHLORIDE 0.9% FLUSH
3.0000 mL | Freq: Two times a day (BID) | INTRAVENOUS | Status: DC
  Administered 2017-03-12: 3 mL via INTRAVENOUS

## 2017-03-11 MED ORDER — SODIUM CHLORIDE 0.9 % IV SOLN: 250.0000 mL | INTRAVENOUS | Status: DC | PRN

## 2017-03-11 MED ORDER — VENLAFAXINE HCL 37.5 MG PO TABS
37.5000 mg | ORAL_TABLET | Freq: Every morning | ORAL | Status: DC
  Administered 2017-03-11 – 2017-03-12 (×2): 37.5 mg via ORAL
  Filled 2017-03-11 (×2): qty 1

## 2017-03-11 MED ORDER — DIVALPROEX SODIUM 125 MG PO CSDR
250.0000 mg | DELAYED_RELEASE_CAPSULE | Freq: Once | ORAL | Status: AC
  Administered 2017-03-11: 250 mg via ORAL
  Filled 2017-03-11: qty 2

## 2017-03-11 MED ORDER — ONDANSETRON HCL 4 MG/2ML IJ SOLN: 4.0000 mg | Freq: Four times a day (QID) | INTRAMUSCULAR | Status: DC | PRN

## 2017-03-11 MED ORDER — RENA-VITE PO TABS
1.0000 | ORAL_TABLET | Freq: Every day | ORAL | Status: DC
  Administered 2017-03-11: 1 via ORAL
  Filled 2017-03-11: qty 1

## 2017-03-11 MED ORDER — HEPARIN SODIUM (PORCINE) 1000 UNIT/ML DIALYSIS: 1000.0000 [IU] | INTRAMUSCULAR | Status: DC | PRN

## 2017-03-11 MED ORDER — SODIUM CHLORIDE 0.9% FLUSH: 3.0000 mL | INTRAVENOUS | Status: DC | PRN

## 2017-03-11 MED ORDER — ALTEPLASE 2 MG IJ SOLR: 2.0000 mg | Freq: Once | INTRAMUSCULAR | Status: DC | PRN

## 2017-03-11 MED ORDER — ACETAMINOPHEN 650 MG RE SUPP: 650.0000 mg | Freq: Four times a day (QID) | RECTAL | Status: DC | PRN

## 2017-03-11 MED ORDER — LORAZEPAM 2 MG/ML IJ SOLN: 0.5000 mg | Freq: Four times a day (QID) | INTRAMUSCULAR | Status: DC | PRN

## 2017-03-11 MED ORDER — LISINOPRIL 5 MG PO TABS: 5.0000 mg | ORAL_TABLET | Freq: Every day | ORAL | Status: DC

## 2017-03-11 MED ORDER — ACETAMINOPHEN 325 MG PO TABS: 650.0000 mg | ORAL_TABLET | Freq: Four times a day (QID) | ORAL | Status: DC | PRN

## 2017-03-11 MED ORDER — LISINOPRIL 5 MG PO TABS
5.0000 mg | ORAL_TABLET | Freq: Every day | ORAL | Status: DC
Start: 2017-03-11 — End: 2017-03-11

## 2017-03-11 MED ORDER — LIDOCAINE-PRILOCAINE 2.5-2.5 % EX CREA: 1.0000 "application " | TOPICAL_CREAM | CUTANEOUS | Status: DC | PRN

## 2017-03-11 MED ORDER — ONDANSETRON HCL 4 MG PO TABS: 4.0000 mg | ORAL_TABLET | Freq: Four times a day (QID) | ORAL | Status: DC | PRN

## 2017-03-11 MED ORDER — VENLAFAXINE HCL ER 75 MG PO CP24: 75.0000 mg | ORAL_CAPSULE | Freq: Every day | ORAL | Status: DC

## 2017-03-11 MED ORDER — LIDOCAINE HCL (PF) 1 % IJ SOLN: 5.0000 mL | INTRAMUSCULAR | Status: DC | PRN

## 2017-03-11 NOTE — Progress Notes (Signed)
EEG complete - results pending 

## 2017-03-11 NOTE — Evaluation (Signed)
Occupational Therapy Evaluation Patient Details Name: [name] MRN: [id number] DOB: April 14, 1945 Today's Date: 03/11/2017    History of Present Illness 71 year old manpast history of ESRD on dialysis, coronary artery disease, diabetes, HTN, hyperlipidemia prostate cancer, sleep apnea, lacunar stroke (05/2016) with residual right-sided weakness, recent subdural on the left cerebral convexity, presenting for evaluation of what sounds like progressive memory issues and cognitive decline as well as intermittent episodes of delirium and agitation. His MRI is grossly abnormal from November showing accelerated chronic white matter disease as well as numerous microhemorrhages all over his brain, Pt has suspected underlying seizure activity.   Clinical Impression   PTA Pt reports mobility with SPC and mod I for ADL with shower chair. Pt is currently overall min A for UB and LB ADL - balance, safety awareness, and activity tolerance all impacting independence. BUE coordination deficits noted - Pt with good strength and grossly able to touch back of head and "tuck in back shirt" Pt will benefit from skilled OT in the acute setting and afterwards at SNF level to maximize safety and independence in ADL and functional transfers. Next session to focus on fine motor coordination essential for grooming tasks like oral care.     Follow Up Recommendations  SNF;Supervision/Assistance - 24 hour    Equipment Recommendations  Other (comment)(double check what he has at home - defer to next venue)    Recommendations for Other Services       Precautions / Restrictions Precautions Precautions: Fall Restrictions Weight Bearing Restrictions: No      Mobility Bed Mobility Overal bed mobility: Needs Assistance Bed Mobility: Supine to Sit     Supine to sit: Supervision     General bed mobility comments: supervision of safety as pt moves quickly with decr coordination  Transfers Overall transfer level:  Needs assistance Equipment used: 2 person hand held assist Transfers: Sit to/from Stand Sit to Stand: Min assist;+2 safety/equipment         General transfer comment: Needed at least 1 HHA for stability and really better balanced with +2 assist.     Balance Overall balance assessment: Needs assistance Sitting-balance support: No upper extremity supported;Feet supported Sitting balance-Leahy Scale: Fair Sitting balance - Comments: can sit without UE support at EOB.  Was able to put socks on but needed steadying assist due to poor coordination without UE support.  Postural control: Posterior lean Standing balance support: Bilateral upper extremity supported;During functional activity Standing balance-Leahy Scale: Poor Standing balance comment: relies on bil UE support for balance                            ADL either performed or assessed with clinical judgement   ADL Overall ADL's : Needs assistance/impaired Eating/Feeding: Set up   Grooming: Wash/dry hands;Wash/dry face;Set up;Sitting   Upper Body Bathing: Minimal assistance   Lower Body Bathing: Minimal assistance;Sitting/lateral leans Lower Body Bathing Details (indicate cue type and reason): more assist required for sit to stand Upper Body Dressing : Minimal assistance   Lower Body Dressing: Min guard;Minimal assistance;Sitting/lateral leans Lower Body Dressing Details (indicate cue type and reason): Pt with LOB leaning forward that required external support from therapist when Pt was donning socks Toilet Transfer: Minimal assistance;+2 for safety/equipment;Ambulation Toilet Transfer Details (indicate cue type and reason): 1 HHA but more steady with 2 person HHA Toileting- Clothing Manipulation and Hygiene: Minimal assistance       Functional mobility during ADLs: Minimal assistance;+2  for physical assistance;+2 for safety/equipment(2 HHA)       Vision Patient Visual Report: No change from baseline        Perception     Praxis      Pertinent Vitals/Pain Pain Assessment: No/denies pain     Hand Dominance Right   Extremity/Trunk Assessment Upper Extremity Assessment Upper Extremity Assessment: RUE deficits/detail;LUE deficits/detail RUE Deficits / Details: decreased coordination strength WFL; reports no sensation deficits RUE Coordination: decreased fine motor;decreased gross motor LUE Deficits / Details: decreased coordination strength WFL; reports no sensation deficits LUE Coordination: decreased fine motor;decreased gross motor   Lower Extremity Assessment Lower Extremity Assessment: Defer to PT evaluation RLE Deficits / Details: grossly 4-/5 RLE Coordination: decreased gross motor LLE Deficits / Details: grossly 4-/5 LLE Coordination: decreased gross motor   Cervical / Trunk Assessment Cervical / Trunk Assessment: Normal   Communication Communication Communication: No difficulties   Cognition Arousal/Alertness: Awake/alert Behavior During Therapy: WFL for tasks assessed/performed Overall Cognitive Status: No family/caregiver present to determine baseline cognitive functioning                                 General Comments: Pt oriented to place, time and situation.   General Comments  no family present to determine baseline cognition    Exercises     Shoulder Instructions      Home Living Family/patient expects to be discharged to:: Private residence Living Arrangements: Spouse/significant other Available Help at Discharge: Family;Available PRN/intermittently Type of Home: House Home Access: Stairs to enter [employer] of Steps: 2 Entrance Stairs-Rails: None Home Layout: One level     Bathroom Shower/Tub: Teacher, early years/pre: Standard     Home Equipment: Cane - single point;Shower seat;Walker - 2 wheels;Walker - 4 wheels;Wheelchair - manual   Additional Comments: wife works during the day       Prior  Functioning/Environment Level of Independence: Independent;Independent with assistive device(s)        Comments: Patient drives himself to HD        OT Problem List: Decreased activity tolerance;Impaired balance (sitting and/or standing);Decreased coordination;Decreased safety awareness;Decreased knowledge of use of DME or AE;Decreased knowledge of precautions      OT Treatment/Interventions: Self-care/ADL training;Neuromuscular education;Energy conservation;DME and/or AE instruction;Therapeutic activities;Patient/family education;Balance training    OT Goals(Current goals can be found in the care plan section) Acute Rehab OT Goals Patient Stated Goal: to go home OT Goal Formulation: With patient Time For Goal Achievement: 03/25/17 Potential to Achieve Goals: Good ADL Goals Pt Will Perform Grooming: with supervision;standing Pt Will Perform Upper Body Bathing: with supervision;sitting Pt Will Perform Lower Body Bathing: with supervision;sitting/lateral leans Pt Will Perform Lower Body Dressing: with supervision;sit to/from stand Pt Will Transfer to Toilet: with supervision;ambulating Pt Will Perform Toileting - Clothing Manipulation and hygiene: with supervision;sit to/from stand Pt Will Perform Tub/Shower Transfer: Tub transfer;with min guard assist;ambulating;shower seat  OT Frequency: Min 2X/week   Barriers to D/C: Decreased caregiver support  Pt's wife works during the day       Co-evaluation PT/OT/SLP Co-Evaluation/Treatment: Yes Reason for Co-Treatment: For Doctor, hospital;To address functional/ADL transfers PT goals addressed during session: Mobility/safety with mobility OT goals addressed during session: ADL's and self-care      AM-PAC PT "6 Clicks" Daily Activity     Outcome Measure Help from another person eating meals?: None Help from another person taking care of personal grooming?: A Little Help from  another person toileting, which includes using  toliet, bedpan, or urinal?: A Little Help from another person bathing (including washing, rinsing, drying)?: A Little Help from another person to put on and taking off regular upper body clothing?: A Little Help from another person to put on and taking off regular lower body clothing?: A Little 6 Click Score: 19   End of Session Equipment Utilized During Treatment: Gait belt Nurse Communication: Mobility status  Activity Tolerance: Patient tolerated treatment well Patient left: in bed;with call Kleinsasser/phone within reach;with bed alarm set  OT Visit Diagnosis: Unsteadiness on feet (R26.81);Other abnormalities of gait and mobility (R26.89);Other symptoms and signs involving cognitive function                Time: 0174-9449 OT Time Calculation (min): 21 min Charges:  OT General Charges $OT Visit: 1 Visit OT Evaluation $OT Eval Moderate Complexity: 1 Mod G-Codes:     Hulda Humphrey OTR/L [place] 03/11/2017, 1:14 PM

## 2017-03-11 NOTE — Progress Notes (Signed)
[facility] PROGRESS NOTE  [name] HCW:[id number] DOB: 1945-04-03 DOA: 03/10/2017  PCP: Mackie Pai, PA-C  Brief History/Interval Summary: 71 year old African-American male with a past medical history of end-stage renal disease on hemodialysis Monday Wednesday Friday, history of diabetes for which he is no longer on any glucose lowering agents, essential hypertension, chronic systolic CHF, coronary artery disease on medical management, history of GI bleed, history of subdural hematoma, previous history of stroke, sustained of fall about a month and a half ago with facial injury as well as subdural hematoma.  He was hospitalized at [facility] and then discharged to skilled nursing facility for rehab.  He did not have any surgical intervention.  He came home from rehab and has had periods of agitation.  EMS was called when he had another episode of agitation and combative behavior.  He was hospitalized for further management.  Reason for Visit: Acute encephalopathy  Consultants: Neurology  Procedures: EEG is pending  Antibiotics: None  Subjective/Interval History: Patient noted to be somewhat distracted.  Denies any complaints.  Denies any headaches.  ROS: No nausea or vomiting  Objective:  Vital Signs  Vitals:   03/10/17 2044 03/10/17 2300 03/11/17 0400 03/11/17 0846  BP:  136/78 118/71 (!) 141/85  Pulse:  81 80 100  Resp:  11 10   Temp:  97.9 F (36.6 C) 97.9 F (36.6 C)   TempSrc:  Axillary Oral   SpO2:  100% 99%   Weight: 54.3 kg (119 lb 11.4 oz)     Height: 5\' 7"  (1.702 m)       Intake/Output Summary (Last 24 hours) at 03/11/2017 1121 Last data filed at 03/11/2017 0200 Gross per 24 hour  Intake 50 ml  Output -  Net 50 ml   Filed Weights   03/10/17 2044  Weight: 54.3 kg (119 lb 11.4 oz)    General appearance: alert, cooperative, appears stated age, distracted and no distress Head: Normocephalic, without obvious abnormality,  atraumatic Resp: clear to auscultation bilaterally Cardio: regular rate and rhythm, S1, S2 normal, no murmur, click, rub or gallop GI: soft, non-tender; bowel sounds normal; no masses,  no organomegaly Extremities: extremities normal, atraumatic, no cyanosis or edema Neurologic: Alert.  Oriented to city, year and month with some hesitation.  No facial asymmetry.  Motor strength equal bilateral upper and lower extremities.  Lab Results:  Data Reviewed: I have personally reviewed following labs and imaging studies  CBC: Recent Labs  Lab 03/10/17 1506 03/10/17 1519 03/11/17 0631  WBC 4.8  --  3.8*  NEUTROABS 3.2  --   --   HGB 13.6 15.0 11.8*  HCT 40.4 44.0 34.5*  MCV 92.2  --  90.6  PLT 245  --  176    Basic Metabolic Panel: Recent Labs  Lab 03/10/17 1506 03/10/17 1519 03/11/17 0631  NA 139 142 140  K 3.9 3.9 3.9  CL 101 104 102  CO2 23  --  23  GLUCOSE 108* 108* 78  BUN 41* 39* 48*  CREATININE 9.89* 10.30* 10.90*  CALCIUM 9.4  --  8.8*    GFR: Estimated Creatinine Clearance: 4.8 mL/min (A) (by C-G formula based on SCr of 10.9 mg/dL (H)).  Liver Function Tests: Recent Labs  Lab 03/10/17 1506 03/11/17 0631  AST 13* 11*  ALT 12* 10*  ALKPHOS 108 97  BILITOT 0.8 0.8  PROT 8.8* 7.3  ALBUMIN 4.2 3.4*     Recent Labs  Lab 03/11/17 0631  AMMONIA  28    Coagulation Profile: Recent Labs  Lab 03/10/17 1506  INR 1.05    CBG: Recent Labs  Lab 03/10/17 1453  GLUCAP 92    Thyroid Function Tests: Recent Labs    03/11/17 0631  TSH 0.417    Anemia Panel: Recent Labs    03/11/17 0631  VITAMINB12 842    Recent Results (from the past 240 hour(s))  MRSA PCR Screening     Status: None   Collection Time: 03/10/17  8:46 PM  Result Value Ref Range Status   MRSA by PCR NEGATIVE NEGATIVE Final    Comment:        The GeneXpert MRSA Assay (FDA approved for NASAL specimens only), is one component of a comprehensive MRSA colonization surveillance  program. It is not intended to diagnose MRSA infection nor to guide or monitor treatment for MRSA infections.       Radiology Studies: Ct Head Wo Contrast  Result Date: 03/10/2017 CLINICAL DATA:  Altered mental status for 2 days. EXAM: CT HEAD WITHOUT CONTRAST TECHNIQUE: Contiguous axial images were obtained from the base of the skull through the vertex without intravenous contrast. COMPARISON:  02/01/2017 FINDINGS: Brain: Left cerebral hemisphere subdural blood has resolved from the previous examination. However, there is a new extra-axial low-density collection adjacent to the inferior right cerebellar hemisphere. This is seen on sequence 3, image 7 and best seen on the coronal reformats, sequence 5, image 50. This low-density structure is mildly heterogeneous. This structure measures 2.6 x 1.0 x 0.7 cm. There is no significant mass effect from this new extra-axial collection. No evidence for midline shift, hydrocephalus or large infarct. Again noted is extensive white matter disease with old ischemic changes and remote lacune infarcts in the deep white matter tracts and deep gray nuclei. Vascular: No hyperdense vessel or unexpected calcification. Skull: Normal. Negative for fracture or focal lesion. Sinuses/Orbits: No acute finding. Other: None. IMPRESSION: New small subdural collection in the right posterior fossa. Findings are suggestive for a subacute subdural hematoma or hygroma. The left cerebral hemisphere subdural hematoma has resolved since 02/01/2017. Chronic white matter changes compatible with small vessel ischemic disease and remote lacune infarcts. Electronically Signed   By: Markus Daft M.D.   On: 03/10/2017 15:46   Dg Chest Port 1 View  Result Date: 03/10/2017 CLINICAL DATA:  Altered mental status EXAM: PORTABLE CHEST 1 VIEW COMPARISON:  12/22/2016 FINDINGS: Mild cardiomegaly. No focal consolidation or effusion. Mild aortic atherosclerosis. No pneumothorax. IMPRESSION:  Cardiomegaly.  Negative for edema or infiltrate. Electronically Signed   By: Donavan Foil M.D.   On: 03/10/2017 16:21     Medications:  Scheduled: . atorvastatin  80 mg Oral QHS  . carvedilol  3.125 mg Oral BID WC  . [START ON 03/12/2017] lisinopril  5 mg Oral QHS  . multivitamin  1 tablet Oral QHS  . sodium chloride flush  3 mL Intravenous Q12H  . venlafaxine  37.5 mg Oral q morning - 10a   Continuous: . sodium chloride    . valproate sodium 250 mg (03/11/17 0846)   XLK:GMWNUU chloride, acetaminophen **OR** acetaminophen, LORazepam, ondansetron **OR** ondansetron (ZOFRAN) IV, sodium chloride flush  Assessment/Plan:  Principal Problem:   Acute encephalopathy Active Problems:   ESRD (end stage renal disease) ([place])   Subdural hematoma ([name])    Acute metabolic encephalopathy Etiology remains unclear.  But it appears that this could be a manifestation of seizure disorder.  Patient has periods of agitation and combativeness and once he  is given medications he comes down and returns back to baseline.  In view of this neurology was consulted.  EEG is pending.  B12, ammonia, TSH all normal.  HIV and RPR is pending.  PT and OT evaluation.  MRI brain is pending.  Due to strong suspicion of seizure patient has been started on Depakote by neurology.  Continue to monitor for now.  May also need to consider psychiatric evaluation.  Right posterior fossa subdural hematoma This was detected on CT scan.  Could also be a hygroma.  Seen by neurosurgery.  Needs repeat imaging study in 2 weeks and follow-up with neurosurgery.  QT prolongation Seen on today's EKG as well.  Will check magnesium level.  Potassium is normal.  Avoid medications that can further prolong QT interval.  End-stage renal disease on hemodialysis He is dialyzed on a Monday Wednesday Friday schedule.  Nephrology is aware of this patient.  Does not appear to be volume overloaded currently.  Dialysis access is in his left  forearm.  History of diabetes mellitus type 2, diet controlled Monitor CBGs periodically.  History of essential hypertension Continue with his home medications.  History of chronic systolic CHF/coronary artery disease Based on echocardiogram from October his ejection fraction is 20-25%.  Volume status is managed with hemodialysis. Patient underwent cardiac catheterization in October when he was hospitalized here.  He was found to have mid LAD lesion first diagonal and mid RCA lesions. He was seen by cardiothoracic surgery.  Medical therapy was recommended. His Plavix is on hold due to recent subdural hematoma.  Apparently also has a history of GI bleed.  Normocytic anemia Most likely due to anemia of chronic kidney disease.  No evidence for overt bleeding.  Continue to monitor.  DVT Prophylaxis: SCDs    Code Status: FullCode Family Communication: Discussed with his wife Disposition Plan: Management as outlined above.    LOS: 1 day   [facility] Pager [phone] 03/11/2017, 11:21 AM  If 7PM-7AM, please contact night-coverage at [email or web address], password [facility]

## 2017-03-11 NOTE — Progress Notes (Addendum)
Patient removed IV during HD today and is refusing to let us start a new one. MD notified and instructed to give PO dose of meds instead of IV. VSS. Will continue to monitor.

## 2017-03-11 NOTE — Procedures (Signed)
I was present at this session.  I have reviewed the session itself and made appropriate changes.  HD via [facility] avf ,bp ^ to start, access press ok.   Jeneen Rinks Brandonlee Navis 12/26/20182:33 PM

## 2017-03-11 NOTE — Care Management Note (Addendum)
Case Management Note  Patient Details  Name: [name] MRN: [id number] Date of Birth: 08-30-1945  Subjective/Objective:   From home with wife, presents with hx of ESRD on dialysis, hx of stroke and recent SDH, now with intermittent delirium, for MRI today. PT eval rec SNF.                   Action/Plan: NCM will follow for dc needs.   Expected Discharge Date:                  Expected Discharge Plan:     In-House Referral:     Discharge planning Services  CM Consult  Post Acute Care Choice:    Choice offered to:     DME Arranged:    DME Agency:     HH Arranged:    HH Agency:     Status of Service:  In process, will continue to follow  If discussed at Long Length of Stay Meetings, dates discussed:    Additional Comments:  Zenon Mayo, RN 03/11/2017, 5:27 PM

## 2017-03-11 NOTE — Consult Note (Signed)
Reason for Consult:ESRD, HTN Referring Physician: Dr. Daine Gip is an 70 y.o. male.  HPI: 71 yr old male with ESRD on HD through [facility] at [facility].  HT>43yr and only admits to DM 2 yr and on no meds for DM.  Hx CVA 3/18, hx fall in 11/18 with facial fx and [facility]  Wife brought to ED with confusion.  Claims went to all of HD tx.   Found to have Sz here.  Also severe microvasc dz or white matter in addition to changes above.  Many other prob:  Prostate Ca, fatty liver, GERD, Angiodysplasia, CAD, OSA, Anemia, peripheral Neuropathy Constitutional: negative Eyes: negative Ears, nose, mouth, throat, and face: negative Respiratory: negative Cardiovascular: negative Gastrointestinal: negative Genitourinary:negative Integument/breast: negative Musculoskeletal:negative Endocrine: DM Allergic/Immunologic: negative   Dialyzes at [facility] on MWF . Access LLA avf.  Past Medical History:  Diagnosis Date  . 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 Anemia, iron deficiency 03/24/2011   a. Recurrent GI bleed, tx with periodic iron infusions.  . 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 AVM (arteriovenous malformation) of duodenum, acquired    egds in 01/2012, 04/2011  . BPH (benign prostatic hyperplasia)   . CAD (coronary artery disease)    a. Cath 09/2012: moderate borderline CAD in mid LAD/small diagonal branch, mild RCA stenosis, to be managed medically   . Chronic lower back pain   . Diabetic peripheral neuropathy ([facility] 2014   foot pain.  . Essential hypertension 02/01/2012  . Fatty liver    on CT of 11/2010  . GERD (gastroesophageal reflux disease)   . GI bleed    a. Recurrent GI bleed, tx with IV iron. b. Per heme notes - likely AVMs 04/2012 (tx with cauterization several months ago).  . Hematuria    a. Urology note scan from 07/2012: cystoscopy without evidence for bladder lesion, only lateral hypertrophy of posterior urethra,  bladder impression from BPH. b. Pt states he had "some tests" scheduled for later in July 2014.  . High cholesterol   . Hyperlipidemia 02/01/2012  . Hypertension   . Hypertensive urgency 07/06/2016  . 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 Orthostatic hypotension    a. Tx with florinef.  . Peripheral neuropathy   . Polyp, colonic    Colonoscopy 01/2012 "benign" polyp  . Prostate cancer ([facility]   . Sleep apnea    "had mask; couldn't sleep in it" (09/20/2012)  . Smoker   . SOB (shortness of breath) 07/06/2016  . Stage III chronic kidney disease ([name])    a. Stage 3 (DM with complications ->CKD, peripheral neuropathy).  . Stroke ([place]    x 2  . Symptomatic anemia 04/03/2015  . Tinea pedis 05/24/2012  . Tobacco use 02/01/2012  . Type 2 diabetes, uncontrolled, with neuropathy ([facility] 02/01/2012  . Type II diabetes mellitus ([facility]    a. Dx 1994, uncontrolled.     Past Surgical History:  Procedure Laterality Date  . ENTEROSCOPY N/A 03/01/2013   Procedure: ENTEROSCOPY;  Surgeon: RInda Castle MD;  [place] [facility]  Service: Endoscopy;  Laterality: N/A;  . ENTEROSCOPY N/A 06/26/2016   Procedure: ENTEROSCOPY;  Surgeon: DMilus Banister MD;  [place] [facility]  Service: Endoscopy;  Laterality: N/A;  this is a push enteroscopy  . ENTEROSCOPY N/A 10/23/2016   Procedure: ENTEROSCOPY;  Surgeon: GGatha Mayer MD;  [place] [facility]  Service:  Endoscopy;  Laterality: N/A;  . GIVENS CAPSULE STUDY N/A 04/05/2015   Procedure: GIVENS CAPSULE STUDY;  Surgeon: Gatha Mayer, MD;  Location: [place];  Service: Endoscopy;  Laterality: N/A;  . INGUINAL HERNIA REPAIR Right 2011  . LEFT HEART CATH AND CORONARY ANGIOGRAPHY N/A 12/23/2016   Procedure: LEFT HEART CATH AND CORONARY ANGIOGRAPHY;  Surgeon: Leonie Man, MD;  Location: [place] [facility];  Service: Cardiovascular;  Laterality: N/A;  . LEFT HEART CATHETERIZATION WITH CORONARY ANGIOGRAM N/A 09/21/2012   Procedure:  LEFT HEART CATHETERIZATION WITH CORONARY ANGIOGRAM;  Surgeon: Peter M Martinique, MD;  Location[facility];  Service: Cardiovascular;  Laterality: N/A;  . LUMBAR DISC SURGERY  1980's  . LYMPHADENECTOMY Bilateral 01/19/2013   Procedure: LYMPHADENECTOMY;  Surgeon: Bernestine Amass, MD;  Location: [facility];  Service: Urology;  Laterality: Bilateral;  . ROBOT ASSISTED LAPAROSCOPIC RADICAL PROSTATECTOMY N/A 01/19/2013   Procedure: ROBOTIC ASSISTED LAPAROSCOPIC RADICAL PROSTATECTOMY;  Surgeon: Bernestine Amass, MD;  Location: [facility];  Service: Urology;  Laterality: N/A;  . SHOULDER OPEN ROTATOR CUFF REPAIR Left 1980's    Family History  Problem Relation Age of Onset  . Stroke Father        Died at 36  . Diabetes Mother   . Heart attack Brother        Died at 43  . Diabetes Sister   . Stomach cancer Brother   . Heart attack Sister     Social History:  reports that he has been smoking cigarettes.  He started smoking about 48 years ago. He has a 15.00 pack-year smoking history. he has never used smokeless tobacco. He reports that he does not drink alcohol or use drugs.  Allergies: No Known Allergies  Medications:  I have reviewed the patient's current medications. Prior to Admission:  Medications Prior to Admission  Medication Sig Dispense Refill Last Dose  . amLODipine (NORVASC) 5 MG tablet Take 5 mg by mouth daily.  0 03/09/2017 at Unknown time  . ARTIFICIAL TEAR SOLUTION OP Instill 1-2 drops into both eyes two to three times a day as needed for dry eyes   PRN at PRN  . atorvastatin (LIPITOR) 80 MG tablet Take 80 mg by mouth at bedtime.   03/09/2017 at pm  . carvedilol (COREG) 3.125 MG tablet Take 1 tablet (3.125 mg total) by mouth 2 (two) times daily with a meal. 180 tablet 1 03/09/2017 at 0900  . gabapentin (NEURONTIN) 300 MG capsule Take 1 capsule (300 mg total) by mouth at bedtime. (Patient taking differently: Take 300 mg by mouth 2 (two) times daily. ) 30 capsule 0 03/09/2017 at am  .  lisinopril (PRINIVIL,ZESTRIL) 5 MG tablet Take 1 tablet (5 mg total) by mouth daily. 90 tablet 1 03/09/2017 at Unknown time  . multivitamin (RENA-VIT) TABS tablet Take 1 tablet by mouth every Monday, Wednesday, and Friday.    03/09/2017 at am  . ranitidine (ZANTAC) 150 MG capsule Take 1 capsule (150 mg total) by mouth 2 (two) times daily. 180 capsule 1 03/09/2017 at Unknown time  . venlafaxine (EFFEXOR) 37.5 MG tablet Take 37.5 mg by mouth every morning.   03/09/2017 at am    Results for orders placed or performed during the hospital encounter of 03/10/17 (from the past 48 hour(s))  CBG monitoring, ED     Status: None   Collection Time: 03/10/17  2:53 PM  Result Value Ref Range   Glucose-Capillary 92 65 - 99 mg/dL   Comment  1 Notify RN    Comment 2 Document in Chart   Ethanol     Status: None   Collection Time: 03/10/17  3:06 PM  Result Value Ref Range   Alcohol, Ethyl (B) <10 <10 mg/dL    Comment:        LOWEST DETECTABLE LIMIT FOR SERUM ALCOHOL IS 10 mg/dL FOR MEDICAL PURPOSES ONLY   Protime-INR     Status: None   Collection Time: 03/10/17  3:06 PM  Result Value Ref Range   Prothrombin Time 13.6 11.4 - 15.2 seconds   INR 1.05   APTT     Status: Abnormal   Collection Time: 03/10/17  3:06 PM  Result Value Ref Range   aPTT 55 (H) 24 - 36 seconds    Comment:        IF BASELINE aPTT IS ELEVATED, SUGGEST PATIENT RISK ASSESSMENT BE USED TO DETERMINE APPROPRIATE ANTICOAGULANT THERAPY.   CBC     Status: Abnormal   Collection Time: 03/10/17  3:06 PM  Result Value Ref Range   WBC 4.8 4.0 - 10.5 K/uL   RBC 4.38 4.22 - 5.81 MIL/uL   Hemoglobin 13.6 13.0 - 17.0 g/dL   HCT 40.4 39.0 - 52.0 %   MCV 92.2 78.0 - 100.0 fL   MCH 31.1 26.0 - 34.0 pg   MCHC 33.7 30.0 - 36.0 g/dL   RDW 17.9 (H) 11.5 - 15.5 %   Platelets 245 150 - 400 K/uL  Differential     Status: None   Collection Time: 03/10/17  3:06 PM  Result Value Ref Range   Neutrophils Relative % 65 %   Neutro Abs 3.2 1.7 - 7.7  K/uL   Lymphocytes Relative 20 %   Lymphs Abs 1.0 0.7 - 4.0 K/uL   Monocytes Relative 11 %   Monocytes Absolute 0.5 0.1 - 1.0 K/uL   Eosinophils Relative 3 %   Eosinophils Absolute 0.2 0.0 - 0.7 K/uL   Basophils Relative 1 %   Basophils Absolute 0.0 0.0 - 0.1 K/uL  Comprehensive metabolic panel     Status: Abnormal   Collection Time: 03/10/17  3:06 PM  Result Value Ref Range   Sodium 139 135 - 145 mmol/L   Potassium 3.9 3.5 - 5.1 mmol/L   Chloride 101 101 - 111 mmol/L   CO2 23 22 - 32 mmol/L   Glucose, Bld 108 (H) 65 - 99 mg/dL   BUN 41 (H) 6 - 20 mg/dL   Creatinine, Ser 9.89 (H) 0.61 - 1.24 mg/dL   Calcium 9.4 8.9 - 10.3 mg/dL   Total Protein 8.8 (H) 6.5 - 8.1 g/dL   Albumin 4.2 3.5 - 5.0 g/dL   AST 13 (L) 15 - 41 U/L   ALT 12 (L) 17 - 63 U/L   Alkaline Phosphatase 108 38 - 126 U/L   Total Bilirubin 0.8 0.3 - 1.2 mg/dL   GFR calc non Af Amer 5 (L) >60 mL/min   GFR calc Af Amer 5 (L) >60 mL/min    Comment: (NOTE) The eGFR has been calculated using the CKD EPI equation. This calculation has not been validated in all clinical situations. eGFR's persistently <60 mL/min signify possible Chronic Kidney Disease.    Anion gap 15 5 - 15  I-stat troponin, ED     Status: None   Collection Time: 03/10/17  3:18 PM  Result Value Ref Range   Troponin i, poc 0.01 0.00 - 0.08 ng/mL   Comment 3  Comment: Due to the release kinetics of cTnI, a negative result within the first hours of the onset of symptoms does not rule out myocardial infarction with certainty. If myocardial infarction is still suspected, repeat the test at appropriate intervals.   I-Stat Chem 8, ED     Status: Abnormal   Collection Time: 03/10/17  3:19 PM  Result Value Ref Range   Sodium 142 135 - 145 mmol/L   Potassium 3.9 3.5 - 5.1 mmol/L   Chloride 104 101 - 111 mmol/L   BUN 39 (H) 6 - 20 mg/dL   Creatinine, Ser 10.30 (H) 0.61 - 1.24 mg/dL   Glucose, Bld 108 (H) 65 - 99 mg/dL   Calcium, Ion 1.03  (L) 1.15 - 1.40 mmol/L   TCO2 24 22 - 32 mmol/L   Hemoglobin 15.0 13.0 - 17.0 g/dL   HCT 44.0 39.0 - 52.0 %  MRSA PCR Screening     Status: None   Collection Time: 03/10/17  8:46 PM  Result Value Ref Range   MRSA by PCR NEGATIVE NEGATIVE    Comment:        The GeneXpert MRSA Assay (FDA approved for NASAL specimens only), is one component of a comprehensive MRSA colonization surveillance program. It is not intended to diagnose MRSA infection nor to guide or monitor treatment for MRSA infections.   Vitamin B12     Status: None   Collection Time: 03/11/17  6:31 AM  Result Value Ref Range   Vitamin B-12 842 180 - 914 pg/mL    Comment: (NOTE) This assay is not validated for testing neonatal or myeloproliferative syndrome specimens for Vitamin B12 levels.   TSH     Status: None   Collection Time: 03/11/17  6:31 AM  Result Value Ref Range   TSH 0.417 0.350 - 4.500 uIU/mL    Comment: Performed by a 3rd Generation assay with a functional sensitivity of <=0.01 uIU/mL.  Ammonia     Status: None   Collection Time: 03/11/17  6:31 AM  Result Value Ref Range   Ammonia 28 9 - 35 umol/L  Comprehensive metabolic panel     Status: Abnormal   Collection Time: 03/11/17  6:31 AM  Result Value Ref Range   Sodium 140 135 - 145 mmol/L   Potassium 3.9 3.5 - 5.1 mmol/L   Chloride 102 101 - 111 mmol/L   CO2 23 22 - 32 mmol/L   Glucose, Bld 78 65 - 99 mg/dL   BUN 48 (H) 6 - 20 mg/dL   Creatinine, Ser 10.90 (H) 0.61 - 1.24 mg/dL   Calcium 8.8 (L) 8.9 - 10.3 mg/dL   Total Protein 7.3 6.5 - 8.1 g/dL   Albumin 3.4 (L) 3.5 - 5.0 g/dL   AST 11 (L) 15 - 41 U/L   ALT 10 (L) 17 - 63 U/L   Alkaline Phosphatase 97 38 - 126 U/L   Total Bilirubin 0.8 0.3 - 1.2 mg/dL   GFR calc non Af Amer 4 (L) >60 mL/min   GFR calc Af Amer 5 (L) >60 mL/min    Comment: (NOTE) The eGFR has been calculated using the CKD EPI equation. This calculation has not been validated in all clinical situations. eGFR's  persistently <60 mL/min signify possible Chronic Kidney Disease.    Anion gap 15 5 - 15  CBC     Status: Abnormal   Collection Time: 03/11/17  6:31 AM  Result Value Ref Range   WBC 3.8 (L) 4.0 - 10.5  K/uL   RBC 3.81 (L) 4.22 - 5.81 MIL/uL   Hemoglobin 11.8 (L) 13.0 - 17.0 g/dL    Comment: REPEATED TO VERIFY   HCT 34.5 (L) 39.0 - 52.0 %   MCV 90.6 78.0 - 100.0 fL   MCH 31.0 26.0 - 34.0 pg   MCHC 34.2 30.0 - 36.0 g/dL   RDW 18.1 (H) 11.5 - 15.5 %   Platelets 227 150 - 400 K/uL    Ct Head Wo Contrast  Result Date: 03/10/2017 CLINICAL DATA:  Altered mental status for 2 days. EXAM: CT HEAD WITHOUT CONTRAST TECHNIQUE: Contiguous axial images were obtained from the base of the skull through the vertex without intravenous contrast. COMPARISON:  02/01/2017 FINDINGS: Brain: Left cerebral hemisphere subdural blood has resolved from the previous examination. However, there is a new extra-axial low-density collection adjacent to the inferior right cerebellar hemisphere. This is seen on sequence 3, image 7 and best seen on the coronal reformats, sequence 5, image 50. This low-density structure is mildly heterogeneous. This structure measures 2.6 x 1.0 x 0.7 cm. There is no significant mass effect from this new extra-axial collection. No evidence for midline shift, hydrocephalus or large infarct. Again noted is extensive white matter disease with old ischemic changes and remote lacune infarcts in the deep white matter tracts and deep gray nuclei. Vascular: No hyperdense vessel or unexpected calcification. Skull: Normal. Negative for fracture or focal lesion. Sinuses/Orbits: No acute finding. Other: None. IMPRESSION: New small subdural collection in the right posterior fossa. Findings are suggestive for a subacute subdural hematoma or hygroma. The left cerebral hemisphere subdural hematoma has resolved since 02/01/2017. Chronic white matter changes compatible with small vessel ischemic disease and remote  lacune infarcts. Electronically Signed   By: Markus Daft M.D.   On: 03/10/2017 15:46   Dg Chest Port 1 View  Result Date: 03/10/2017 CLINICAL DATA:  Altered mental status EXAM: PORTABLE CHEST 1 VIEW COMPARISON:  12/22/2016 FINDINGS: Mild cardiomegaly. No focal consolidation or effusion. Mild aortic atherosclerosis. No pneumothorax. IMPRESSION: Cardiomegaly.  Negative for edema or infiltrate. Electronically Signed   By: Donavan Foil M.D.   On: 03/10/2017 16:21    ROS Blood pressure (!) 141/85, pulse 100, temperature 97.9 F (36.6 C), temperature source Oral, resp. rate 10, height 5' 7" (1.702 m), weight 54.3 kg (119 lb 11.4 oz), SpO2 99 %. Physical Exam Physical Examination: General appearance - thin, NAD Mental status - OX2 poor historian,  Eyes - hypertensive retinopathy Mouth - dental hygiene poor Neck - adenopathy noted PCL Lymphatics - posterior cervical nodes Chest - clear to auscultation, no wheezes, rales or rhonchi, symmetric air entry Heart - S1 and S2 normal, S4 present, systolic murmur XT0/2 at 2nd left intercostal space Abdomen - soft,nontender, pos bs,liver down5 cm Extremities - no edema, DP 1 + but much trophic changes in feet.  LLA AVF B&T Skin - as above  Assessment/Plan: 1 Sz  Per Neuro 2 ESRD: needs HD, lower vol with slow UFR and lower meds.  Needs better chronic control .  No hep 3 Hypertension: as above 4. Anemia of ESRD: ok hold off esa 5. Metabolic Bone Disease: hold off if only here short time 6 CVAs 7 Microvasc cerebrovasc dz 8 SDH 9 DM 10 OSA 11 prostate CA 12 CAD 13 Confusion not totally resolved P HD, follow bps , adjust meds, get Hep status  Jeneen Rinks Jahziah Simonin 03/11/2017, 11:26 AM

## 2017-03-11 NOTE — Procedures (Signed)
ELECTROENCEPHALOGRAM REPORT  Date of Study: 03/11/2017  Patient's Name: [name] MRN: [id number] Date of Birth: 01/26/1946  Referring Provider: Bonnielee Haff, MD  Clinical History: 71 year old male with ESRD on dialysis, history of stroke and recent subdural hematoma presetns with intermittent delirium.  Medications: Valproate Carvedilol  Technical Summary: A multichannel digital EEG recording measured by the international 10-20 system with electrodes applied with paste and impedances below 5000 ohms performed as portable with EKG monitoring in an awake and drowsy patient.  Hyperventilation and photic stimulation were not performed.  The digital EEG was referentially recorded, reformatted, and digitally filtered in a variety of bipolar and referential montages for optimal display.   Description: The patient is awake and drowsy during the recording.  During maximal wakefulness, there is a symmetric, medium voltage 6-7 Hz posterior dominant rhythm that attenuates with eye opening. This is admixed with diffuse 4-5 Hz theta and 2-3 Hz delta slowing of the waking background.  Stage 2 sleep is not seen.  There were no epileptiform discharges or electrographic seizures seen.    EKG lead was unremarkable.  Impression: This awake and drowsy EEG is abnormal due to diffuse slowing of the waking background.  Clinical Correlation of the above findings indicates diffuse cerebral dysfunction that is non-specific in etiology and can be seen with hypoxic/ischemic injury, toxic/metabolic encephalopathies, neurodegenerative disorders, or medication effect.  The absence of epileptiform discharges does not rule out a clinical diagnosis of epilepsy.  Clinical correlation is advised.   Metta Clines, DO

## 2017-03-11 NOTE — Progress Notes (Signed)
EEG shows slowing only.  No lateralizing findings.  MRI pending.  Continue Depakote.  Amie Portland, MD [facility] [phone] If 7pm to 7am, please call on call as listed on AMION.

## 2017-03-11 NOTE — Progress Notes (Addendum)
Neurology Progress Note   S:// No acute changes overnight.  O:// Current vital signs: BP 118/71   Pulse 80   Temp 97.9 F (36.6 C) (Oral)   Resp 10   Ht 5\' 7"  (1.702 m)   Wt 54.3 kg (119 lb 11.4 oz)   SpO2 99%   BMI 18.75 kg/m  Vital signs in last 24 hours: Temp:  [97.5 F (36.4 C)-97.9 F (36.6 C)] 97.9 F (36.6 C) (12/26 0400) Pulse Rate:  [66-83] 80 (12/26 0400) Resp:  [10-22] 10 (12/26 0400) BP: (118-183)/(71-107) 118/71 (12/26 0400) SpO2:  [95 %-100 %] 99 % (12/26 0400) Weight:  [54.3 kg (119 lb 11.4 oz)] 54.3 kg (119 lb 11.4 oz) (12/25 2044) Unchanged exam GENERAL: Awake, alert in NAD HEENT: - Normocephalic and atraumatic, dry mm, no LN++, no Thyromegally LUNGS - Clear to auscultation bilaterally with no wheezes CV - S1S2 RRR, no m/r/g, equal pulses bilaterally. ABDOMEN - Soft, nontender, nondistended with normoactive BS Ext: warm, well perfused, intact peripheral pulses, no edema NEURO:  Mental Status: AA& oriented to time place and person.  Was able to tell me his name.  Month date and the fact that it is 12/26 today along with the fact that he is in [facility] in [place]. Language: speech is dysarthric.  Naming, repetition, fluency, and comprehension intact. Cranial Nerves: PERRL. EOMI, visual fields full, no facial asymmetry, facial sensation intact, hearing intact, tongue/uvula/soft palate midline, normal sternocleidomastoid and trapezius muscle strength. No evidence of tongue atrophy or fibrillations Motor: Symmetric antigravity in all 4 extremities with normal tone and range of motion in the left but slightly increased tone in the right. Sensation- Intact to light touch bilaterally Coordination: FTN intact bilaterally, no ataxia in BLE. Gait- deferred   Medications  Current Facility-Administered Medications:  .  0.9 %  sodium chloride infusion, 250 mL, Intravenous, PRN, Bonnielee Haff, MD .  acetaminophen (TYLENOL) tablet 650 mg, 650 mg,  Oral, Q6H PRN **OR** acetaminophen (TYLENOL) suppository 650 mg, 650 mg, Rectal, Q6H PRN, Bonnielee Haff, MD .  atorvastatin (LIPITOR) tablet 80 mg, 80 mg, Oral, QHS, Schorr, Rhetta Mura, NP .  carvedilol (COREG) tablet 3.125 mg, 3.125 mg, Oral, BID WC, Bonnielee Haff, MD .  lisinopril (PRINIVIL,ZESTRIL) tablet 5 mg, 5 mg, Oral, Daily, Bonnielee Haff, MD .  LORazepam (ATIVAN) injection 0.5 mg, 0.5 mg, Intravenous, Q6H PRN, Bonnielee Haff, MD .  ondansetron (ZOFRAN) tablet 4 mg, 4 mg, Oral, Q6H PRN **OR** ondansetron (ZOFRAN) injection 4 mg, 4 mg, Intravenous, Q6H PRN, Bonnielee Haff, MD .  sodium chloride flush (NS) 0.9 % injection 3 mL, 3 mL, Intravenous, Q12H, Bonnielee Haff, MD .  sodium chloride flush (NS) 0.9 % injection 3 mL, 3 mL, Intravenous, PRN, Bonnielee Haff, MD .  thiamine (VITAMIN B-1) tablet 100 mg, 100 mg, Oral, Daily, Bonnielee Haff, MD .  valproate (DEPACON) 250 mg in dextrose 5 % 50 mL IVPB, 250 mg, Intravenous, Q12H, Amie Portland, MD .  venlafaxine [facility]) tablet 37.5 mg, 37.5 mg, Oral, q morning - 10a, Schorr, Rhetta Mura, NP Labs CBC    Component Value Date/Time   WBC 3.8 (L) 03/11/2017 0631   RBC 3.81 (L) 03/11/2017 0631   HGB 11.8 (L) 03/11/2017 0631   HGB 10.8 (L) 11/06/2016 1303   HCT 34.5 (L) 03/11/2017 0631   HCT 32.6 (L) 11/06/2016 1303   PLT 227 03/11/2017 0631   PLT 258 11/06/2016 1303   MCV 90.6 03/11/2017 0631   MCV 95 11/06/2016 1303  MCH 31.0 03/11/2017 0631   MCHC 34.2 03/11/2017 0631   RDW 18.1 (H) 03/11/2017 0631   RDW 17.1 (H) 11/06/2016 1303   LYMPHSABS 1.0 03/10/2017 1506   LYMPHSABS 1.0 11/06/2016 1303   MONOABS 0.5 03/10/2017 1506   EOSABS 0.2 03/10/2017 1506   EOSABS 0.2 11/06/2016 1303   BASOSABS 0.0 03/10/2017 1506   BASOSABS 0.0 11/06/2016 1303    CMP     Component Value Date/Time   NA 140 03/11/2017 0631   NA 138 11/06/2016 1303   NA 137 10/16/2016 1257   NA 141 11/15/2015 0917   K 3.9 03/11/2017 0631   K 4.8  11/06/2016 1303   K 4.3 10/16/2016 1257   K 4.0 11/15/2015 0917   CL 102 03/11/2017 0631   CL 100 11/06/2016 1303   CL 97 (L) 10/16/2016 1257   CO2 23 03/11/2017 0631   CO2 30 (H) 11/06/2016 1303   CO2 31 10/16/2016 1257   CO2 27 11/15/2015 0917   GLUCOSE 78 03/11/2017 0631   GLUCOSE 149 (H) 10/16/2016 1257   BUN 48 (H) 03/11/2017 0631   BUN 18 11/06/2016 1303   BUN 26 (H) 10/16/2016 1257   BUN 18.7 11/15/2015 0917   CREATININE 10.90 (H) 03/11/2017 0631   CREATININE 6.56 (H) 11/06/2016 1303   CREATININE 8.4 (HH) 10/16/2016 1257   CREATININE 5.3 (HH) 11/15/2015 0917   CALCIUM 8.8 (L) 03/11/2017 0631   CALCIUM 9.0 11/06/2016 1303   CALCIUM 8.0 10/16/2016 1257   CALCIUM 8.9 11/15/2015 0917   PROT 7.3 03/11/2017 0631   PROT 7.7 11/06/2016 1303   PROT 7.2 10/16/2016 1257   PROT 7.2 11/15/2015 0917   ALBUMIN 3.4 (L) 03/11/2017 0631   ALBUMIN 4.0 11/06/2016 1303   ALBUMIN 2.7 (L) 11/15/2015 0917   AST 11 (L) 03/11/2017 0631   AST 8 11/06/2016 1303   AST 16 10/16/2016 1257   AST 12 11/15/2015 0917   ALT 10 (L) 03/11/2017 0631   ALT 6 11/06/2016 1303   ALT 15 10/16/2016 1257   ALT <9 11/15/2015 0917   ALKPHOS 97 03/11/2017 0631   ALKPHOS 113 11/06/2016 1303   ALKPHOS 98 (H) 10/16/2016 1257   ALKPHOS 117 11/15/2015 0917   BILITOT 0.8 03/11/2017 0631   BILITOT 0.3 11/06/2016 1303   BILITOT 0.50 10/16/2016 1257   BILITOT <0.30 11/15/2015 0917   GFRNONAA 4 (L) 03/11/2017 0631   GFRAA 5 (L) 03/11/2017 0631   B12 - 842 TSH 0.417  Imaging I have reviewed images in epic and the results pertinent to this consultation are: MRI 01/20/17  IMPRESSION:  Abnormal MRI brain (without) demonstrating: 1. Moderate-severe perisylvian and mild mesial temporal atrophy.  2. Moderate periventricular, subcortical and pontine chronic small vessel ischemic disease. 3. Multiple (>10) supratentorial and infratentorial chronic cerebral microhemorrhages. Left caudate and left peri-rolandic sulcal  hemorsiderin deposition also noted.  4. No acute findings. 5. Compared to MRI on 04/04/15, there has been progression of chronic small vessel ischemic disease.  CT-scan of the brain -subacute left posterior fossa subdural which was not present on the prior scan.  Stable chronic white matter disease   ASSESSMENT 71 year old man past history of ESRD on dialysis, coronary artery disease, diabetes, hypertension hyperlipidemia prostate cancer, sleep apnea, lacunar stroke in March 2018 with residual right-sided weakness, recent subdural on the left cerebral convexity that has resolved, presenting for evaluation of what sounds like progressive memory issues and cognitive decline as well as intermittent episodes of delirium and agitation. Looking  at his current labs, he has deranged renal function, secondary to his ESRD which could be contributing to this acute toxic metabolic encephalopathy on top of underlying cognitive impairment from his chronic small vessel disease. His MRI is grossly abnormal from November showing accelerated chronic white matter disease as well as numerous microhemorrhages all over his brain indicating uncontrolled hypertension. For yesterday's episode, after receiving Versed and Haldol his demeanor improved and his clinical exam was also much better.  This is highly suspicious for underlying seizure activity  Impression: -Evaluate for seizures secondary to underlying cognitive impairment/dementia -Toxic metabolic encephalopathy -Underlying cognitive impairment secondary to possible vascular dementia versus other causes of dementia - will need continued outpatient f/u with neurology -Subacute SDH  Recommendations: MRI brain w/o cont Continue Depakote Seizure precautions EEG Correct toxic metabolic derangements as you are. OP neurology f/u on discharge OP psychiatry consult might be beneficial for behavioral problems associated with cognitive impairment. F/U CTH for SDH in  2 weeks and NSGY follow up as advised by NSGY  Will update recs after diagnostic study results are available.   Amie Portland, MD [facility] [phone] If 7pm to 7am, please call on call as listed on AMION.

## 2017-03-11 NOTE — Evaluation (Signed)
Physical Therapy Evaluation Patient Details Name: [name] MRN: [id number] DOB: 01-07-1946 Today's Date: 03/11/2017   History of Present Illness  71 year old manpast history of ESRD on dialysis, coronary artery disease, diabetes, HTN, hyperlipidemia prostate cancer, sleep apnea, lacunar stroke (05/2016) with residual right-sided weakness, recent subdural on the left cerebral convexity, presenting for evaluation of what sounds like progressive memory issues and cognitive decline as well as intermittent episodes of delirium and agitation. His MRI is grossly abnormal from November showing accelerated chronic Imajean Mcdermid matter disease as well as numerous microhemorrhages all over his brain, Pt has suspected underlying seizure activity.  Clinical Impression  Pt admitted with above diagnosis. Pt currently with functional limitations due to the deficits listed below (see PT Problem List). Pt was able to ambulate with bil HHA.  Overall, poor safety awareness and poor coordination without assist.  Will need SNF at d/c.   Pt will benefit from skilled PT to increase their independence and safety with mobility to allow discharge to the venue listed below.      Follow Up Recommendations SNF;Supervision/Assistance - 24 hour    Equipment Recommendations  Other (comment)(TBA)    Recommendations for Other Services       Precautions / Restrictions Precautions Precautions: Fall Restrictions Weight Bearing Restrictions: No      Mobility  Bed Mobility Overal bed mobility: Needs Assistance Bed Mobility: Supine to Sit     Supine to sit: Supervision     General bed mobility comments: supervision of safety as pt moves quickly with decr coordination  Transfers Overall transfer level: Needs assistance Equipment used: 2 person hand held assist Transfers: Sit to/from Stand Sit to Stand: Min assist;+2 safety/equipment         General transfer comment: Needed at least 1 HHA for stability and really  better balanced with +2 assist.   Ambulation/Gait Ambulation/Gait assistance: Min assist;+2 physical assistance Ambulation Distance (Feet): 280 Feet Assistive device: 2 person hand held assist;1 person hand held assist Gait Pattern/deviations: Step-through pattern;Decreased stride length;Shuffle;Leaning posteriorly;Drifts right/left   Gait velocity interpretation: Below normal speed for age/gender General Gait Details: Pt requires at least 1UE support for balance with ambulation and did best with 2 person HHA.  Pt with uncoordination bil LEs wh8ich afffects gait and stability.  will moste likely be safer wtih a device at home  Stairs            Wheelchair Mobility    Modified Rankin (Stroke Patients Only)       Balance Overall balance assessment: Needs assistance Sitting-balance support: No upper extremity supported;Feet supported Sitting balance-Leahy Scale: Fair Sitting balance - Comments: can sit without UE support at EOB.  Was able to put socks on but needed steadying assist due to poor coordination without UE support.  Postural control: Posterior lean Standing balance support: Bilateral upper extremity supported;During functional activity Standing balance-Leahy Scale: Poor Standing balance comment: relies on bil UE support for balance                              Pertinent Vitals/Pain Pain Assessment: No/denies pain  VSS  Home Living Family/patient expects to be discharged to:: Private residence Living Arrangements: Spouse/significant other Available Help at Discharge: Family;Available PRN/intermittently Type of Home: House Home Access: Stairs to enter Entrance Stairs-Rails: None Entrance Stairs-Number of Steps: 2 Home Layout: One level Home Equipment: Cane - single point;Shower seat;Walker - 2 wheels;Walker - 4 wheels;Wheelchair - manual Additional Comments: wife  works during the day     Prior Function Level of Independence: Independent;Independent  with assistive device(s)         Comments: Patient drives himself to HD     Hand Dominance   Dominant Hand: Right    Extremity/Trunk Assessment   Upper Extremity Assessment Upper Extremity Assessment: Defer to OT evaluation    Lower Extremity Assessment Lower Extremity Assessment: RLE deficits/detail;LLE deficits/detail RLE Deficits / Details: grossly 4-/5 RLE Coordination: decreased gross motor LLE Deficits / Details: grossly 4-/5 LLE Coordination: decreased gross motor    Cervical / Trunk Assessment Cervical / Trunk Assessment: Normal  Communication   Communication: No difficulties  Cognition Arousal/Alertness: Awake/alert Behavior During Therapy: WFL for tasks assessed/performed Overall Cognitive Status: Within Functional Limits for tasks assessed                                 General Comments: Pt oriented to place, time and situation.       General Comments      Exercises     Assessment/Plan    PT Assessment Patient needs continued PT services  PT Problem List Decreased strength;Decreased activity tolerance;Decreased balance;Decreased mobility;Decreased coordination;Decreased knowledge of use of DME;Decreased safety awareness;Decreased knowledge of precautions       PT Treatment Interventions DME instruction;Gait training;Functional mobility training;Therapeutic activities;Therapeutic exercise;Balance training;Patient/family education    PT Goals (Current goals can be found in the Care Plan section)  Acute Rehab PT Goals Patient Stated Goal: to go home PT Goal Formulation: With patient Time For Goal Achievement: 03/25/17 Potential to Achieve Goals: Good    Frequency Min 3X/week   Barriers to discharge Decreased caregiver support(wife works)      Co-evaluation PT/OT/SLP Co-Evaluation/Treatment: Yes Reason for Co-Treatment: For Doctor, hospital PT goals addressed during session: Mobility/safety with mobility          AM-PAC PT "6 Clicks" Daily Activity  Outcome Measure Difficulty turning over in bed (including adjusting bedclothes, sheets and blankets)?: None Difficulty moving from lying on back to sitting on the side of the bed? : None Difficulty sitting down on and standing up from a chair with arms (e.g., wheelchair, bedside commode, etc,.)?: A Little Help needed moving to and from a bed to chair (including a wheelchair)?: A Little Help needed walking in hospital room?: A Little Help needed climbing 3-5 steps with a railing? : Total 6 Click Score: 18    End of Session Equipment Utilized During Treatment: Gait belt Activity Tolerance: Patient limited by fatigue Patient left: in bed;with call Nix/phone within reach;with bed alarm set Nurse Communication: Mobility status PT Visit Diagnosis: Unsteadiness on feet (R26.81);Muscle weakness (generalized) (M62.81);Repeated falls (R29.6)    Time: 1610-9604 PT Time Calculation (min) (ACUTE ONLY): 21 min   Charges:   PT Evaluation $PT Eval Moderate Complexity: 1 Mod     PT G Codes:        Esma Kilts,PT Acute Rehabilitation [phone] (pager)   Denice Paradise 03/11/2017, 12:34 PM

## 2017-03-12 ENCOUNTER — Inpatient Hospital Stay (HOSPITAL_COMMUNITY): Payer: Medicare Other

## 2017-03-12 LAB — HEPATITIS B SURFACE ANTIGEN: HEP B S AG: NEGATIVE

## 2017-03-12 MED ORDER — DIVALPROEX SODIUM 125 MG PO CSDR
250.0000 mg | DELAYED_RELEASE_CAPSULE | Freq: Two times a day (BID) | ORAL | Status: DC
  Administered 2017-03-12: 250 mg via ORAL
  Filled 2017-03-12: qty 2

## 2017-03-12 MED ORDER — DIVALPROEX SODIUM 125 MG PO CSDR: 250.0000 mg | DELAYED_RELEASE_CAPSULE | Freq: Two times a day (BID) | ORAL | 1 refills | Status: DC

## 2017-03-12 NOTE — Progress Notes (Signed)
Subjective: Interval History: has complaints not doing well, getting stuck for ivs.  Objective: Vital signs in last 24 hours: Temp:  [97.6 F (36.4 C)-98 F (36.7 C)] 97.6 F (36.4 C) (12/27 0700) Pulse Rate:  [78-105] 92 (12/27 1011) Resp:  [11-23] 12 (12/27 0500) BP: (96-142)/(66-90) 116/76 (12/27 1011) SpO2:  [96 %-100 %] 97 % (12/27 0500) Weight:  [52.3 kg (115 lb 4.8 oz)-54.3 kg (119 lb 11.4 oz)] 52.3 kg (115 lb 4.8 oz) (12/26 1829) Weight change: 0 kg (0 oz)  Intake/Output from previous day: 12/26 0701 - 12/27 0700 In: 50 [IV Piggyback:50] Out: 2000  Intake/Output this shift: No intake/output data recorded.  General appearance: cooperative, no distress and slowed mentation Resp: diminished breath sounds bilaterally Cardio: S1, S2 normal and systolic murmur: systolic ejection 2/6, decrescendo at 2nd left intercostal space GI: soft, non-tender; bowel sounds normal; no masses,  no organomegaly Extremities: avf LLA  Lab Results: Recent Labs    03/10/17 1506 03/10/17 1519 03/11/17 0631  WBC 4.8  --  3.8*  HGB 13.6 15.0 11.8*  HCT 40.4 44.0 34.5*  PLT 245  --  227   BMET:  Recent Labs    03/10/17 1506 03/10/17 1519 03/11/17 0631  NA 139 142 140  K 3.9 3.9 3.9  CL 101 104 102  CO2 23  --  23  GLUCOSE 108* 108* 78  BUN 41* 39* 48*  CREATININE 9.89* 10.30* 10.90*  CALCIUM 9.4  --  8.8*   No results for input(s): PTH in the last 72 hours. Iron Studies: No results for input(s): IRON, TIBC, TRANSFERRIN, FERRITIN in the last 72 hours.  Studies/Results: Ct Head Wo Contrast  Result Date: 03/10/2017 CLINICAL DATA:  Altered mental status for 2 days. EXAM: CT HEAD WITHOUT CONTRAST TECHNIQUE: Contiguous axial images were obtained from the base of the skull through the vertex without intravenous contrast. COMPARISON:  02/01/2017 FINDINGS: Brain: Left cerebral hemisphere subdural blood has resolved from the previous examination. However, there is a new extra-axial  low-density collection adjacent to the inferior right cerebellar hemisphere. This is seen on sequence 3, image 7 and best seen on the coronal reformats, sequence 5, image 50. This low-density structure is mildly heterogeneous. This structure measures 2.6 x 1.0 x 0.7 cm. There is no significant mass effect from this new extra-axial collection. No evidence for midline shift, hydrocephalus or large infarct. Again noted is extensive white matter disease with old ischemic changes and remote lacune infarcts in the deep white matter tracts and deep gray nuclei. Vascular: No hyperdense vessel or unexpected calcification. Skull: Normal. Negative for fracture or focal lesion. Sinuses/Orbits: No acute finding. Other: None. IMPRESSION: New small subdural collection in the right posterior fossa. Findings are suggestive for a subacute subdural hematoma or hygroma. The left cerebral hemisphere subdural hematoma has resolved since 02/01/2017. Chronic white matter changes compatible with small vessel ischemic disease and remote lacune infarcts. Electronically Signed   By: Markus Daft M.D.   On: 03/10/2017 15:46   Dg Chest Port 1 View  Result Date: 03/10/2017 CLINICAL DATA:  Altered mental status EXAM: PORTABLE CHEST 1 VIEW COMPARISON:  12/22/2016 FINDINGS: Mild cardiomegaly. No focal consolidation or effusion. Mild aortic atherosclerosis. No pneumothorax. IMPRESSION: Cardiomegaly.  Negative for edema or infiltrate. Electronically Signed   By: Donavan Foil M.D.   On: 03/10/2017 16:21    I have reviewed the patient's current medications.  Assessment/Plan: 1  ESRD Hd went well, for HD in am.   2 HTn controlled  meds stopped 3 DM 4 Anemia stable 5 HPTH no values 6 SDH, multifarct dz, small vessel dz and now SZ 7 Dementia P HD in am, Sz meds , stop antiHTN  meds    LOS: 2 days   [name] 03/12/2017,10:52 AM

## 2017-03-12 NOTE — Discharge Summary (Addendum)
[facility]  Physician Discharge Summary   Patient ID: [name] MRN: [id number] DOB/AGE: [birth year] 71 y.o.  Admit date: 03/10/2017 Discharge date: 03/12/2017  PCP: Mackie Pai, PA-C  DISCHARGE DIAGNOSES:  Principal Problem:   Acute encephalopathy Active Problems:   ESRD (end stage renal disease) ([place])   Subdural hematoma ([name])   RECOMMENDATIONS FOR OUTPATIENT FOLLOW UP: 1. Patient and his family instructed that he will need to be followed up by his primary care provider in 7-10 days 2. He will need a Depakote level to be checked at that point in time. 3. Patient will need to have a CT scan of his head done in 2 weeks for subdural hematoma 4. Consider outpatient psychiatry consultation if patient's agitation does not improve.   DISCHARGE CONDITION: fair  Diet recommendation:  Low Sodium  Filed Weights   03/10/17 2044 03/11/17 1429 03/11/17 1829  Weight: 54.3 kg (119 lb 11.4 oz) 54.3 kg (119 lb 11.4 oz) 52.3 kg (115 lb 4.8 oz)    INITIAL HISTORY: 71 year old African-American male with a past medical history of end-stage renal disease on hemodialysis Monday Wednesday Friday, history of diabetes for which he is no longer on any glucose lowering agents, essential hypertension, chronic systolic CHF, coronary artery disease on medical management, history of GI bleed, history of subdural hematoma, previous history of stroke, sustained of fall about a month and a half ago with facial injury as well as subdural hematoma.  He was hospitalized at [facility] and then discharged to skilled nursing facility for rehab.  He did not have any surgical intervention.  He came home from rehab and has had periods of agitation.  EMS was called when he had another episode of agitation and combative behavior.  He was hospitalized for further management.   Consultants: Neurology  Procedures: EEG did not reveal any epileptiform activity.   HOSPITAL COURSE:   Acute  metabolic encephalopathy/Possible Seizures/Vascular Dementia Etiology remains unclear.  But it appears that this could be a manifestation of seizure disorder based on his symptoms.  Patient has periods of agitation and combativeness and once he is given medications he calms down and returns back to baseline.  In view of this neurology was consulted.  Patient was empirically placed on Depakote.  EEG did not show any epileptiform activity.  B12, ammonia, TSH all normal.  HIV and RPR is negative.  MRI brain did not reveal any acute stroke.  Discussed with neurology.  Patient does not need any further workup.  He will be discharged home on twice a day Depakote.  Level will need to be checked at his PCPs office at follow-up.  If subtherapeutic he may need a higher dose of Depakote. Seen by physical therapy and skilled nursing facility was recommended.  However it appears that the patient and his family were not interested in same. Home health will be ordered.  Will need to consider psychiatry evaluation as an outpatient.  Vascular dementia is another possibility considering atrophy and small vessel disease noted on MRI.  Right posterior fossa subdural hematoma This was detected on CT scan.  Seen by neurosurgery.  Needs repeat imaging study in 2 weeks and possibly follow-up with neurosurgery.  QT prolongation Magnesium level is normal.  Potassium is normal. Avoid medications that can further prolong QT interval.  End-stage renal disease on hemodialysis He is dialyzed on a Monday Wednesday Friday schedule.  Nephrology was consulted.  Patient was dialyzed on Wednesday.  He may resume his usual dialysis  schedule starting tomorrow.  Dialysis access is in his left forearm.  History of diabetes mellitus type 2, diet controlled Stable  History of essential hypertension Blood pressure is reasonably well controlled.  Nephrology recommends discontinuing antihypertensives.  History of chronic systolic  CHF/coronary artery disease Based on echocardiogram from October his ejection fraction is 20-25%. Volume status is managed with hemodialysis. Patient underwent cardiac catheterization in October when he was hospitalized here. He was found to have mid LAD lesion first diagonal and mid RCA lesions. He was seen by cardiothoracic surgery. Medical therapy was recommended. His Plavix is on hold due to recent subdural hematoma. Apparently also has a history of GI bleed.  Normocytic anemia Most likely due to anemia of chronic kidney disease.  Drop in hemoglobin most likely due to fluid shifts.  No evidence for overt bleeding.   Overall stable.  Okay for discharge home today.  Discussed with his wife who agrees with this plan.     PERTINENT LABS:  The results of significant diagnostics from this hospitalization (including imaging, microbiology, ancillary and laboratory) are listed below for reference.    Microbiology: Recent Results (from the past 240 hour(s))  MRSA PCR Screening     Status: None   Collection Time: 03/10/17  8:46 PM  Result Value Ref Range Status   MRSA by PCR NEGATIVE NEGATIVE Final    Comment:        The GeneXpert MRSA Assay (FDA approved for NASAL specimens only), is one component of a comprehensive MRSA colonization surveillance program. It is not intended to diagnose MRSA infection nor to guide or monitor treatment for MRSA infections.      Labs: Basic Metabolic Panel: Recent Labs  Lab 03/10/17 1506 03/10/17 1519 03/11/17 0631  NA 139 142 140  K 3.9 3.9 3.9  CL 101 104 102  CO2 23  --  23  GLUCOSE 108* 108* 78  BUN 41* 39* 48*  CREATININE 9.89* 10.30* 10.90*  CALCIUM 9.4  --  8.8*  MG  --   --  1.9   Liver Function Tests: Recent Labs  Lab 03/10/17 1506 03/11/17 0631  AST 13* 11*  ALT 12* 10*  ALKPHOS 108 97  BILITOT 0.8 0.8  PROT 8.8* 7.3  ALBUMIN 4.2 3.4*    Recent Labs  Lab 03/11/17 0631  AMMONIA 28   CBC: Recent Labs  Lab  03/10/17 1506 03/10/17 1519 03/11/17 0631  WBC 4.8  --  3.8*  NEUTROABS 3.2  --   --   HGB 13.6 15.0 11.8*  HCT 40.4 44.0 34.5*  MCV 92.2  --  90.6  PLT 245  --  227   CBG: Recent Labs  Lab 03/10/17 1453  GLUCAP 92     IMAGING STUDIES Ct Head Wo Contrast  Result Date: 03/10/2017 CLINICAL DATA:  Altered mental status for 2 days. EXAM: CT HEAD WITHOUT CONTRAST TECHNIQUE: Contiguous axial images were obtained from the base of the skull through the vertex without intravenous contrast. COMPARISON:  02/01/2017 FINDINGS: Brain: Left cerebral hemisphere subdural blood has resolved from the previous examination. However, there is a new extra-axial low-density collection adjacent to the inferior right cerebellar hemisphere. This is seen on sequence 3, image 7 and best seen on the coronal reformats, sequence 5, image 50. This low-density structure is mildly heterogeneous. This structure measures 2.6 x 1.0 x 0.7 cm. There is no significant mass effect from this new extra-axial collection. No evidence for midline shift, hydrocephalus or large infarct. Again noted  is extensive white matter disease with old ischemic changes and remote lacune infarcts in the deep white matter tracts and deep gray nuclei. Vascular: No hyperdense vessel or unexpected calcification. Skull: Normal. Negative for fracture or focal lesion. Sinuses/Orbits: No acute finding. Other: None. IMPRESSION: New small subdural collection in the right posterior fossa. Findings are suggestive for a subacute subdural hematoma or hygroma. The left cerebral hemisphere subdural hematoma has resolved since 02/01/2017. Chronic white matter changes compatible with small vessel ischemic disease and remote lacune infarcts. Electronically Signed   By: Markus Daft M.D.   On: 03/10/2017 15:46   [name] Contrast  Result Date: 03/12/2017 CLINICAL DATA:  Altered level consciousness. Fell 1 month ago resulting in subdural hematoma. History of end-stage  renal disease on dialysis, hypertension, diabetes, hyperlipidemia. EXAM: MRI HEAD WITHOUT CONTRAST TECHNIQUE: Multiplanar, multiecho pulse sequences of the brain and surrounding structures were obtained without intravenous contrast. COMPARISON:  CT HEAD March 10, 2017 and MRI of the head January 20, 2017 FINDINGS: BRAIN: No reduced diffusion to suggest acute ischemia. Numerous scattered supra and infratentorial microhemorrhage predominately in central sister fusion. Old LEFT basal ganglia infarct with susceptibility artifact. Old RIGHT basal ganglia lacunar infarct and multiple thalamus lacunar infarcts. Prominent basal ganglia and thalamus perivascular spaces assisted with chronic small vessel ischemic disease. Patchy to confluent supratentorial pontine white matter T2 hyperintensities. Old small bilateral cerebellar infarcts. Moderate to severe parenchymal brain volume loss for age. No hydrocephalus. Small T2 bright RIGHT posterior fossa extra-axial collection without mass effect. Resolution of supratentorial subdural collections. Mild dural thickening associated with prior sub dural hematoma. VASCULAR: Normal major intracranial vascular flow voids present at skull base. SKULL AND UPPER CERVICAL SPINE: No abnormal sellar expansion. 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 Craniocervical junction maintained. SINUSES/ORBITS: The mastoid air-cells and included paranasal sinuses are well-aerated. The included ocular globes and orbital contents are non-suspicious. OTHER: None. IMPRESSION: 1. No acute intracranial process. 2. Small RIGHT posterior fossa subdural collection as seen on recent CT without mass effect. Resolution of supratentorial subdural hematoma. 3. Moderate to severe parenchymal brain volume loss. 4. Moderate to severe chronic small vessel ischemic disease. Multiple old small vessel infarcts. 5. Extensive micro hemorrhages associated with chronic hypertension.  Electronically Signed   By: Elon Alas M.D.   On: 03/12/2017 16:20   Dg Chest Port 1 View  Result Date: 03/10/2017 CLINICAL DATA:  Altered mental status EXAM: PORTABLE CHEST 1 VIEW COMPARISON:  12/22/2016 FINDINGS: Mild cardiomegaly. No focal consolidation or effusion. Mild aortic atherosclerosis. No pneumothorax. IMPRESSION: Cardiomegaly.  Negative for edema or infiltrate. Electronically Signed   By: Donavan Foil M.D.   On: 03/10/2017 16:21    DISCHARGE EXAMINATION: See progress note from earlier today.  DISPOSITION: Home with spouse with home health  Discharge Instructions    Ambulatory referral to Neurology   Complete by:  As directed    An appointment is requested in approximately: 2 weeks   Call MD for:  extreme fatigue   Complete by:  As directed    Call MD for:  persistant dizziness or light-headedness   Complete by:  As directed    Call MD for:  persistant nausea and vomiting   Complete by:  As directed    Call MD for:  severe uncontrolled pain   Complete by:  As directed    Call MD for:  temperature >100.4   Complete by:  As directed    Discharge instructions   Complete by:  As directed    Please be sure to follow up with your PCP within 7-10 days. Depakote level will need to be checked at that time to ensure that appropriate amount of the drug is being prescribed. You will also need to undergo CT scan of Head in 2 weeks to monitor the subdural hematoma. You may be referred to a neurosurgeon depending on the findings. Please go for dialysis starting tomorrow.  You were cared for by a hospitalist during your hospital stay. If you have any questions about your discharge medications or the care you received while you were in the hospital after you are discharged, you can call the unit and asked to speak with the hospitalist on call if the hospitalist that took care of you is not available. Once you are discharged, your primary care physician will handle any further  medical issues. Please note that NO REFILLS for any discharge medications will be authorized once you are discharged, as it is imperative that you return to your primary care physician (or establish a relationship with a primary care physician if you do not have one) for your aftercare needs so that they can reassess your need for medications and monitor your lab values. If you do not have a primary care physician, you can call [phone] for a physician referral.   Increase activity slowly   Complete by:  As directed         Allergies as of 03/12/2017   No Known Allergies     Medication List    STOP taking these medications   amLODipine 5 MG tablet Commonly known as:  NORVASC   carvedilol 3.125 MG tablet Commonly known as:  COREG   gabapentin 300 MG capsule Commonly known as:  NEURONTIN   lisinopril 5 MG tablet Commonly known as:  PRINIVIL,ZESTRIL     TAKE these medications   ARTIFICIAL TEAR SOLUTION OP Instill 1-2 drops into both eyes two to three times a day as needed for dry eyes   atorvastatin 80 MG tablet Commonly known as:  LIPITOR Take 80 mg by mouth at bedtime.   divalproex 125 MG capsule Commonly known as:  DEPAKOTE SPRINKLE Take 2 capsules (250 mg total) by mouth 2 (two) times daily.   multivitamin Tabs tablet Take 1 tablet by mouth every Monday, Wednesday, and Friday.   ranitidine 150 MG capsule Commonly known as:  ZANTAC Take 1 capsule (150 mg total) by mouth 2 (two) times daily.   venlafaxine 37.5 MG tablet Commonly known as:  EFFEXOR Take 37.5 mg by mouth every morning.       Follow-up Information    Saguier, Iris Pert. Schedule an appointment as soon as possible for a visit in 1 week(s).   Specialties:  Internal Medicine, Family Medicine Why:  Needs depakote level next week. Needs repeat CT head in 2 weeks. Contact information: [address] [place] [address] [phone]        Garvin Fila, MD. Schedule an  appointment as soon as possible for a visit in 2 week(s).   Specialties:  Neurology, Radiology Why:  referral sent to his office Contact information: [address] [place] [address] [place]: 35 mins  Bonnielee Haff  [facility] Pager [phone]  03/12/2017, 5:46 PM

## 2017-03-12 NOTE — Discharge Summary (Deleted)
[facility]  Physician Discharge Summary   Patient ID: [name] MRN: [id number] DOB/AGE: Sep 13, 1945 71 y.o.  Admit date: 03/10/2017 Discharge date: 03/12/2017  PCP: Mackie Pai, PA-C  DISCHARGE DIAGNOSES:  Principal Problem:   Acute encephalopathy Active Problems:   ESRD (end stage renal disease) ([place])   Subdural hematoma ([name])   RECOMMENDATIONS FOR OUTPATIENT FOLLOW UP: 1. Patient and his family instructed that he will need to be followed up by his primary care provider in 7-10 days 2. He will need a Depakote level to be checked at that point in time. 3. Patient will need to have a CT scan of his head done in 2 weeks for subdural hematoma 4. Consider outpatient psychiatry consultation if patient's agitation does not improve.   DISCHARGE CONDITION: fair  Diet recommendation:  Low Sodium  Filed Weights   03/10/17 2044 03/11/17 1429 03/11/17 1829  Weight: 54.3 kg (119 lb 11.4 oz) 54.3 kg (119 lb 11.4 oz) 52.3 kg (115 lb 4.8 oz)    INITIAL HISTORY: 71 year old African-American male with a past medical history of end-stage renal disease on hemodialysis Monday Wednesday Friday, history of diabetes for which he is no longer on any glucose lowering agents, essential hypertension, chronic systolic CHF, coronary artery disease on medical management, history of GI bleed, history of subdural hematoma, previous history of stroke, sustained of fall about a month and a half ago with facial injury as well as subdural hematoma.  He was hospitalized at [facility] and then discharged to skilled nursing facility for rehab.  He did not have any surgical intervention.  He came home from rehab and has had periods of agitation.  EMS was called when he had another episode of agitation and combative behavior.  He was hospitalized for further management.   Consultants: Neurology  Procedures: EEG did not reveal any epileptiform activity.   HOSPITAL COURSE:   Acute  metabolic encephalopathy/Possible Seizures/Vascular Dementia Etiology remains unclear.  But it appears that this could be a manifestation of seizure disorder based on his symptoms.  Patient has periods of agitation and combativeness and once he is given medications he calms down and returns back to baseline.  In view of this neurology was consulted.  Patient was empirically placed on Depakote.  EEG did not show any epileptiform activity.  B12, ammonia, TSH all normal.  HIV and RPR is negative.  MRI brain did not reveal any acute stroke.  Discussed with neurology.  Patient does not need any further workup.  He will be discharged home on twice a day Depakote.  Level will need to be checked at his PCPs office at follow-up.  If subtherapeutic he may need a higher dose of Depakote. Seen by physical therapy and skilled nursing facility was recommended.  However it appears that the patient and his family were not interested in same. Home health will be ordered.  Will need to consider psychiatry evaluation as an outpatient.  Vascular dementia is another possibility considering atrophy and small vessel disease noted on MRI.  Right posterior fossa subdural hematoma This was detected on CT scan.  Seen by neurosurgery.  Needs repeat imaging study in 2 weeks and possibly follow-up with neurosurgery.  QT prolongation Magnesium level is normal.  Potassium is normal. Avoid medications that can further prolong QT interval.  End-stage renal disease on hemodialysis He is dialyzed on a Monday Wednesday Friday schedule.  Nephrology was consulted.  Patient was dialyzed on Wednesday.  He may resume his usual dialysis  schedule starting tomorrow.  Dialysis access is in his left forearm.  History of diabetes mellitus type 2, diet controlled Stable  History of essential hypertension Blood pressure is reasonably well controlled.  Nephrology recommends discontinuing antihypertensives.  History of chronic systolic  CHF/coronary artery disease Based on echocardiogram from October his ejection fraction is 20-25%. Volume status is managed with hemodialysis. Patient underwent cardiac catheterization in October when he was hospitalized here. He was found to have mid LAD lesion first diagonal and mid RCA lesions. He was seen by cardiothoracic surgery. Medical therapy was recommended. His Plavix is on hold due to recent subdural hematoma. Apparently also has a history of GI bleed.  Normocytic anemia Most likely due to anemia of chronic kidney disease.  Drop in hemoglobin most likely due to fluid shifts.  No evidence for overt bleeding.   Overall stable.  Okay for discharge home today.  Discussed with his wife who agrees with this plan.     PERTINENT LABS:  The results of significant diagnostics from this hospitalization (including imaging, microbiology, ancillary and laboratory) are listed below for reference.    Microbiology: Recent Results (from the past 240 hour(s))  MRSA PCR Screening     Status: None   Collection Time: 03/10/17  8:46 PM  Result Value Ref Range Status   MRSA by PCR NEGATIVE NEGATIVE Final    Comment:        The GeneXpert MRSA Assay (FDA approved for NASAL specimens only), is one component of a comprehensive MRSA colonization surveillance program. It is not intended to diagnose MRSA infection nor to guide or monitor treatment for MRSA infections.      Labs: Basic Metabolic Panel: Recent Labs  Lab 03/10/17 1506 03/10/17 1519 03/11/17 0631  NA 139 142 140  K 3.9 3.9 3.9  CL 101 104 102  CO2 23  --  23  GLUCOSE 108* 108* 78  BUN 41* 39* 48*  CREATININE 9.89* 10.30* 10.90*  CALCIUM 9.4  --  8.8*  MG  --   --  1.9   Liver Function Tests: Recent Labs  Lab 03/10/17 1506 03/11/17 0631  AST 13* 11*  ALT 12* 10*  ALKPHOS 108 97  BILITOT 0.8 0.8  PROT 8.8* 7.3  ALBUMIN 4.2 3.4*    Recent Labs  Lab 03/11/17 0631  AMMONIA 28   CBC: Recent Labs  Lab  03/10/17 1506 03/10/17 1519 03/11/17 0631  WBC 4.8  --  3.8*  NEUTROABS 3.2  --   --   HGB 13.6 15.0 11.8*  HCT 40.4 44.0 34.5*  MCV 92.2  --  90.6  PLT 245  --  227   CBG: Recent Labs  Lab 03/10/17 1453  GLUCAP 92     IMAGING STUDIES Ct Head Wo Contrast  Result Date: 03/10/2017 CLINICAL DATA:  Altered mental status for 2 days. EXAM: CT HEAD WITHOUT CONTRAST TECHNIQUE: Contiguous axial images were obtained from the base of the skull through the vertex without intravenous contrast. COMPARISON:  02/01/2017 FINDINGS: Brain: Left cerebral hemisphere subdural blood has resolved from the previous examination. However, there is a new extra-axial low-density collection adjacent to the inferior right cerebellar hemisphere. This is seen on sequence 3, image 7 and best seen on the coronal reformats, sequence 5, image 50. This low-density structure is mildly heterogeneous. This structure measures 2.6 x 1.0 x 0.7 cm. There is no significant mass effect from this new extra-axial collection. No evidence for midline shift, hydrocephalus or large infarct. Again noted  is extensive white matter disease with old ischemic changes and remote lacune infarcts in the deep white matter tracts and deep gray nuclei. Vascular: No hyperdense vessel or unexpected calcification. Skull: Normal. Negative for fracture or focal lesion. Sinuses/Orbits: No acute finding. Other: None. IMPRESSION: New small subdural collection in the right posterior fossa. Findings are suggestive for a subacute subdural hematoma or hygroma. The left cerebral hemisphere subdural hematoma has resolved since 02/01/2017. Chronic white matter changes compatible with small vessel ischemic disease and remote lacune infarcts. Electronically Signed   By: Markus Daft M.D.   On: 03/10/2017 15:46   [name] Contrast  Result Date: 03/12/2017 CLINICAL DATA:  Altered level consciousness. Fell 1 month ago resulting in subdural hematoma. History of end-stage  renal disease on dialysis, hypertension, diabetes, hyperlipidemia. EXAM: MRI HEAD WITHOUT CONTRAST TECHNIQUE: Multiplanar, multiecho pulse sequences of the brain and surrounding structures were obtained without intravenous contrast. COMPARISON:  CT HEAD March 10, 2017 and MRI of the head January 20, 2017 FINDINGS: BRAIN: No reduced diffusion to suggest acute ischemia. Numerous scattered supra and infratentorial microhemorrhage predominately in central sister fusion. Old LEFT basal ganglia infarct with susceptibility artifact. Old RIGHT basal ganglia lacunar infarct and multiple thalamus lacunar infarcts. Prominent basal ganglia and thalamus perivascular spaces assisted with chronic small vessel ischemic disease. Patchy to confluent supratentorial pontine white matter T2 hyperintensities. Old small bilateral cerebellar infarcts. Moderate to severe parenchymal brain volume loss for age. No hydrocephalus. Small T2 bright RIGHT posterior fossa extra-axial collection without mass effect. Resolution of supratentorial subdural collections. Mild dural thickening associated with prior sub dural hematoma. VASCULAR: Normal major intracranial vascular flow voids present at skull base. SKULL AND UPPER CERVICAL SPINE: No abnormal sellar expansion. 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 Craniocervical junction maintained. SINUSES/ORBITS: The mastoid air-cells and included paranasal sinuses are well-aerated. The included ocular globes and orbital contents are non-suspicious. OTHER: None. IMPRESSION: 1. No acute intracranial process. 2. Small RIGHT posterior fossa subdural collection as seen on recent CT without mass effect. Resolution of supratentorial subdural hematoma. 3. Moderate to severe parenchymal brain volume loss. 4. Moderate to severe chronic small vessel ischemic disease. Multiple old small vessel infarcts. 5. Extensive micro hemorrhages associated with chronic hypertension.  Electronically Signed   By: Elon Alas M.D.   On: 03/12/2017 16:20   Dg Chest Port 1 View  Result Date: 03/10/2017 CLINICAL DATA:  Altered mental status EXAM: PORTABLE CHEST 1 VIEW COMPARISON:  12/22/2016 FINDINGS: Mild cardiomegaly. No focal consolidation or effusion. Mild aortic atherosclerosis. No pneumothorax. IMPRESSION: Cardiomegaly.  Negative for edema or infiltrate. Electronically Signed   By: Donavan Foil M.D.   On: 03/10/2017 16:21    DISCHARGE EXAMINATION: See progress note from earlier today.  DISPOSITION: Home with spouse with home health  Discharge Instructions    Call MD for:  extreme fatigue   Complete by:  As directed    Call MD for:  persistant dizziness or light-headedness   Complete by:  As directed    Call MD for:  persistant nausea and vomiting   Complete by:  As directed    Call MD for:  severe uncontrolled pain   Complete by:  As directed    Call MD for:  temperature >100.4   Complete by:  As directed    Discharge instructions   Complete by:  As directed    Please be sure to follow up with your PCP within 7-10 days. Depakote level will need to be  checked at that time to ensure that appropriate amount of the drug is being prescribed. You will also need to undergo CT scan of Head in 2 weeks to monitor the subdural hematoma. You may be referred to a neurosurgeon depending on the findings. Please go for dialysis starting tomorrow.  You were cared for by a hospitalist during your hospital stay. If you have any questions about your discharge medications or the care you received while you were in the hospital after you are discharged, you can call the unit and asked to speak with the hospitalist on call if the hospitalist that took care of you is not available. Once you are discharged, your primary care physician will handle any further medical issues. Please note that NO REFILLS for any discharge medications will be authorized once you are discharged, as it is  imperative that you return to your primary care physician (or establish a relationship with a primary care physician if you do not have one) for your aftercare needs so that they can reassess your need for medications and monitor your lab values. If you do not have a primary care physician, you can call [phone] for a physician referral.   Increase activity slowly   Complete by:  As directed         Allergies as of 03/12/2017   No Known Allergies     Medication List    STOP taking these medications   amLODipine 5 MG tablet Commonly known as:  NORVASC   carvedilol 3.125 MG tablet Commonly known as:  COREG   gabapentin 300 MG capsule Commonly known as:  NEURONTIN   lisinopril 5 MG tablet Commonly known as:  PRINIVIL,ZESTRIL     TAKE these medications   ARTIFICIAL TEAR SOLUTION OP Instill 1-2 drops into both eyes two to three times a day as needed for dry eyes   atorvastatin 80 MG tablet Commonly known as:  LIPITOR Take 80 mg by mouth at bedtime.   divalproex 125 MG capsule Commonly known as:  DEPAKOTE SPRINKLE Take 2 capsules (250 mg total) by mouth 2 (two) times daily.   multivitamin Tabs tablet Take 1 tablet by mouth every Monday, Wednesday, and Friday.   ranitidine 150 MG capsule Commonly known as:  ZANTAC Take 1 capsule (150 mg total) by mouth 2 (two) times daily.   venlafaxine 37.5 MG tablet Commonly known as:  EFFEXOR Take 37.5 mg by mouth every morning.       Follow-up Information    Saguier, Iris Pert. Schedule an appointment as soon as possible for a visit in 1 week(s).   Specialties:  Internal Medicine, Family Medicine Why:  Needs depakote level next week. Needs repeat CT head in 2 weeks. Contact information: [place] [address] [phone]           TOTAL DISCHARGE TIME: 35 mins  Bonnielee Haff  [facility] Pager [phone]  03/12/2017, 5:21 PM

## 2017-03-12 NOTE — Progress Notes (Signed)
Per report from [place] pt would like to d/c home with [facility] services when ready for d/c. Pt was active with [facility]. CM continuing to follow.

## 2017-03-12 NOTE — Progress Notes (Signed)
[facility] PROGRESS NOTE  IVIN Hill WEX:[id number] DOB: 03-11-1946 DOA: 03/10/2017  PCP: Mackie Pai, PA-C  Brief History/Interval Summary: 71 year old African-American male with a past medical history of end-stage renal disease on hemodialysis Monday Wednesday Friday, history of diabetes for which he is no longer on any glucose lowering agents, essential hypertension, chronic systolic CHF, coronary artery disease on medical management, history of GI bleed, history of subdural hematoma, previous history of stroke, sustained of fall about a month and a half ago with facial injury as well as subdural hematoma.  He was hospitalized at [facility] and then discharged to skilled nursing facility for rehab.  He did not have any surgical intervention.  He came home from rehab and has had periods of agitation.  EMS was called when he had another episode of agitation and combative behavior.  He was hospitalized for further management.  Reason for Visit: Acute encephalopathy  Consultants: Neurology  Procedures: EEG did not reveal any epileptiform activity.  Antibiotics: None  Subjective/Interval History: Patient asking when he can go home.  He denies any pain.  Per nursing staff he has not had any acute issues in the last 24 hours.    ROS: Denies any nausea vomiting.  Remains distracted.  Objective:  Vital Signs  Vitals:   03/12/17 0500 03/12/17 0700 03/12/17 1011 03/12/17 1100  BP: 104/66  116/76   Pulse: 87  92   Resp: 12     Temp:  97.6 F (36.4 C)  97.9 F (36.6 C)  TempSrc:  Oral  Oral  SpO2: 97%     Weight:      Height:        Intake/Output Summary (Last 24 hours) at 03/12/2017 1240 Last data filed at 03/12/2017 1225 Gross per 24 hour  Intake 3 ml  Output 2000 ml  Net -1997 ml   Filed Weights   03/10/17 2044 03/11/17 1429 03/11/17 1829  Weight: 54.3 kg (119 lb 11.4 oz) 54.3 kg (119 lb 11.4 oz) 52.3 kg (115 lb 4.8 oz)    General appearance:  Awake alert.  In no distress. Resp: Clear to auscultation bilaterally Cardio: S1-S2 is normal regular.  No S3-S4.  No rubs murmurs or bruit GI: Abdomen is soft.  Nontender nondistended.  Bowel sounds are present.  No masses organomegaly Extremities: No pedal edema Neurologic: Alert.  Mildly distracted but remains oriented to city, year and month with some hesitation.  No facial asymmetry.  Motor strength equal bilateral upper and lower extremities.  Lab Results:  Data Reviewed: I have personally reviewed following labs and imaging studies  CBC: Recent Labs  Lab 03/10/17 1506 03/10/17 1519 03/11/17 0631  WBC 4.8  --  3.8*  NEUTROABS 3.2  --   --   HGB 13.6 15.0 11.8*  HCT 40.4 44.0 34.5*  MCV 92.2  --  90.6  PLT 245  --  938    Basic Metabolic Panel: Recent Labs  Lab 03/10/17 1506 03/10/17 1519 03/11/17 0631  NA 139 142 140  K 3.9 3.9 3.9  CL 101 104 102  CO2 23  --  23  GLUCOSE 108* 108* 78  BUN 41* 39* 48*  CREATININE 9.89* 10.30* 10.90*  CALCIUM 9.4  --  8.8*  MG  --   --  1.9    GFR: Estimated Creatinine Clearance: 4.7 mL/min (A) (by C-G formula based on SCr of 10.9 mg/dL (H)).  Liver Function Tests: Recent Labs  Lab 03/10/17 1506 03/11/17 0631  AST 13* 11*  ALT 12* 10*  ALKPHOS 108 97  BILITOT 0.8 0.8  PROT 8.8* 7.3  ALBUMIN 4.2 3.4*     Recent Labs  Lab 03/11/17 0631  AMMONIA 28    Coagulation Profile: Recent Labs  Lab 03/10/17 1506  INR 1.05    CBG: Recent Labs  Lab 03/10/17 1453  GLUCAP 92    Thyroid Function Tests: Recent Labs    03/11/17 0631  TSH 0.417    Anemia Panel: Recent Labs    03/11/17 0631  VITAMINB12 842    Recent Results (from the past 240 hour(s))  MRSA PCR Screening     Status: None   Collection Time: 03/10/17  8:46 PM  Result Value Ref Range Status   MRSA by PCR NEGATIVE NEGATIVE Final    Comment:        The GeneXpert MRSA Assay (FDA approved for NASAL specimens only), is one component of  a comprehensive MRSA colonization surveillance program. It is not intended to diagnose MRSA infection nor to guide or monitor treatment for MRSA infections.       Radiology Studies: Ct Head Wo Contrast  Result Date: 03/10/2017 CLINICAL DATA:  Altered mental status for 2 days. EXAM: CT HEAD WITHOUT CONTRAST TECHNIQUE: Contiguous axial images were obtained from the base of the skull through the vertex without intravenous contrast. COMPARISON:  02/01/2017 FINDINGS: Brain: Left cerebral hemisphere subdural blood has resolved from the previous examination. However, there is a new extra-axial low-density collection adjacent to the inferior right cerebellar hemisphere. This is seen on sequence 3, image 7 and best seen on the coronal reformats, sequence 5, image 50. This low-density structure is mildly heterogeneous. This structure measures 2.6 x 1.0 x 0.7 cm. There is no significant mass effect from this new extra-axial collection. No evidence for midline shift, hydrocephalus or large infarct. Again noted is extensive white matter disease with old ischemic changes and remote lacune infarcts in the deep white matter tracts and deep gray nuclei. Vascular: No hyperdense vessel or unexpected calcification. Skull: Normal. Negative for fracture or focal lesion. Sinuses/Orbits: No acute finding. Other: None. IMPRESSION: New small subdural collection in the right posterior fossa. Findings are suggestive for a subacute subdural hematoma or hygroma. The left cerebral hemisphere subdural hematoma has resolved since 02/01/2017. Chronic white matter changes compatible with small vessel ischemic disease and remote lacune infarcts. Electronically Signed   By: Markus Daft M.D.   On: 03/10/2017 15:46   Dg Chest Port 1 View  Result Date: 03/10/2017 CLINICAL DATA:  Altered mental status EXAM: PORTABLE CHEST 1 VIEW COMPARISON:  12/22/2016 FINDINGS: Mild cardiomegaly. No focal consolidation or effusion. Mild aortic  atherosclerosis. No pneumothorax. IMPRESSION: Cardiomegaly.  Negative for edema or infiltrate. Electronically Signed   By: Donavan Foil M.D.   On: 03/10/2017 16:21     Medications:  Scheduled: . atorvastatin  80 mg Oral QHS  . multivitamin  1 tablet Oral QHS  . sodium chloride flush  3 mL Intravenous Q12H  . venlafaxine  37.5 mg Oral q morning - 10a   Continuous: . sodium chloride    . sodium chloride    . sodium chloride    . valproate sodium 250 mg (03/12/17 1011)   GGY:IRSWNI chloride, sodium chloride, sodium chloride, acetaminophen **OR** acetaminophen, alteplase, heparin, lidocaine (PF), lidocaine-prilocaine, LORazepam, ondansetron **OR** ondansetron (ZOFRAN) IV, pentafluoroprop-tetrafluoroeth, sodium chloride flush  Assessment/Plan:  Principal Problem:   Acute encephalopathy Active Problems:   ESRD (end stage renal disease) ([place])  Subdural hematoma ([name])    Acute metabolic encephalopathy Etiology remains unclear.  But it appears that this could be a manifestation of seizure disorder based on his symptoms.  Patient has periods of agitation and combativeness and once he is given medications he comes down and returns back to baseline.  In view of this neurology was consulted.  Patient was empirically placed on Depakote.  EEG did not show any epileptiform activity.  B12, ammonia, TSH all normal.  HIV and RPR is negative.  MRI brain is pending.  Seen by physical therapy and skilled nursing facility was recommended.  However it appears that the patient was not interested in same.  Once her neurological workup was completed he should be able to go back home with home health.  Will need to consider psychiatry evaluation as an outpatient.  Continue Depakote for now.  Change to oral.  Right posterior fossa subdural hematoma This was detected on CT scan.  Could also be a hygroma.  Seen by neurosurgery.  Needs repeat imaging study in 2 weeks and follow-up with neurosurgery.  QT  prolongation Magnesium level is normal.  Potassium is normal. Avoid medications that can further prolong QT interval.  End-stage renal disease on hemodialysis He is dialyzed on a Monday Wednesday Friday schedule.  Nephrology is following.  Dialysis access is in his left forearm.  History of diabetes mellitus type 2, diet controlled Monitor CBGs periodically.  History of essential hypertension Blood pressure is reasonably well controlled.  Nephrology recommends discontinuing antihypertensives.  History of chronic systolic CHF/coronary artery disease Based on echocardiogram from October his ejection fraction is 20-25%.  Volume status is managed with hemodialysis. Patient underwent cardiac catheterization in October when he was hospitalized here.  He was found to have mid LAD lesion first diagonal and mid RCA lesions. He was seen by cardiothoracic surgery.  Medical therapy was recommended. His Plavix is on hold due to recent subdural hematoma.  Apparently also has a history of GI bleed.  Normocytic anemia Most likely due to anemia of chronic kidney disease.  Drop in hemoglobin most likely due to fluid shifts.  No evidence for overt bleeding.   DVT Prophylaxis: SCDs    Code Status: FullCode Family Communication: No family at bedside Disposition Plan: Await MRI.  Okay for transfer to floor.    LOS: 2 days   [facility] Pager [phone] 03/12/2017, 12:40 PM  If 7PM-7AM, please contact night-coverage at [email or web address], password [facility]

## 2017-03-12 NOTE — Progress Notes (Addendum)
Subjective: No further seizures. Doing well on Depakote.   Exam: Vitals:   03/12/17 0500 03/12/17 0700  BP: 104/66   Pulse: 87   Resp: 12   Temp:  97.6 F (36.4 C)  SpO2: 97%     HEENT-  Normocephalic, no lesions, without obvious abnormality.  Normal external eye and conjunctiva.  Normal TM's bilaterally.  Normal auditory canals and external ears. Normal external nose, mucus membranes and septum.  Normal pharynx. Cardiovascular- S1, S2 normal, pulses palpable throughout   Lungs- chest clear, no wheezing, rales, normal symmetric air entry Abdomen- normal findings: bowel sounds normal Extremities- no edema  Neuro:  CN: Pupils are equal and round. They are symmetrically reactive from 3-->2 mm. EOMI without nystagmus. Facial sensation is intact to light touch. Face is symmetric at rest with normal strength and mobility. Hearing is intact to conversational voice. Palate elevates symmetrically and uvula is midline. Voice is normal in tone, pitch and quality. Bilateral SCM and trapezii are 5/5. Tongue is midline with normal bulk and mobility.  Motor: Normal bulk, tone, and strength. 5/5 throughout. No drift.  Sensation: Intact to light touch.  DTRs: 2+, symmetric  Toes downgoing bilaterally. No pathologic reflexes.  Coordination: Finger-to-nose and heel-to-shin are without dysmetria   Medications:  Scheduled: . atorvastatin  80 mg Oral QHS  . carvedilol  3.125 mg Oral BID WC  . lisinopril  5 mg Oral QHS  . multivitamin  1 tablet Oral QHS  . sodium chloride flush  3 mL Intravenous Q12H  . venlafaxine  37.5 mg Oral q morning - 10a    Pertinent Labs/Diagnostics: Awaiting MRI brain  Ct Head Wo Contrast  Result Date: 03/10/2017 CLINICAL DATA:  Altered mental status for 2 days. EXAM: CT HEAD WITHOUT CONTRAST TECHNIQUE: Contiguous axial images were obtained from the base of the skull through the vertex without intravenous contrast. COMPARISON:  02/01/2017 FINDINGS: Brain: Left cerebral  hemisphere subdural blood has resolved from the previous examination. However, there is a new extra-axial low-density collection adjacent to the inferior right cerebellar hemisphere. This is seen on sequence 3, image 7 and best seen on the coronal reformats, sequence 5, image 50. This low-density structure is mildly heterogeneous. This structure measures 2.6 x 1.0 x 0.7 cm. There is no significant mass effect from this new extra-axial collection. No evidence for midline shift, hydrocephalus or large infarct. Again noted is extensive white matter disease with old ischemic changes and remote lacune infarcts in the deep white matter tracts and deep gray nuclei. Vascular: No hyperdense vessel or unexpected calcification. Skull: Normal. Negative for fracture or focal lesion. Sinuses/Orbits: No acute finding. Other: None. IMPRESSION: New small subdural collection in the right posterior fossa. Findings are suggestive for a subacute subdural hematoma or hygroma. The left cerebral hemisphere subdural hematoma has resolved since 02/01/2017. Chronic white matter changes compatible with small vessel ischemic disease and remote lacune infarcts. Electronically Signed   By: Markus Daft M.D.   On: 03/10/2017 15:46   Dg Chest Port 1 View  Result Date: 03/10/2017 CLINICAL DATA:  Altered mental status EXAM: PORTABLE CHEST 1 VIEW COMPARISON:  12/22/2016 FINDINGS: Mild cardiomegaly. No focal consolidation or effusion. Mild aortic atherosclerosis. No pneumothorax. IMPRESSION: Cardiomegaly.  Negative for edema or infiltrate. Electronically Signed   By: Donavan Foil M.D.   On: 03/10/2017 16:21     Etta Quill PA-C [facility] [phone]  ASSESSMENT 71 year old manpast history of ESRD on dialysis, coronary artery disease, diabetes, hypertension hyperlipidemia prostate cancer, sleep apnea,  lacunar stroke in March 2018 with residual right-sided weakness, recent subdural on the left cerebral convexity that has  resolved, presenting for evaluation of what sounds like progressive memory issues and cognitive decline as well as intermittent episodes of delirium and agitation. Looking at his current labs, he has deranged renal function, secondary to his ESRD which could be contributing to this acute toxic metabolic encephalopathy on top of underlying cognitive impairment from his chronic small vessel disease. His MRI is grossly abnormal from November showing accelerated chronic white matter disease as well as numerous microhemorrhages all over his brain indicating uncontrolled hypertension.   Patient has risk factors for seizures and was started on Depakote. He is tolerating Depakote well . Awaiting MRI. EEg showed no active seizures.   Impression: -Evaluate for seizures secondary to underlying cognitive impairment/dementia -Toxic metabolic encephalopathy -Underlying cognitive impairment secondary to possible vascular dementia versus other causes of dementia - will need continued outpatient f/u with neurology -Subacute SDH  Recommendations: MRI brain w/o cont Continue Depakote Seizure precautions Correct toxic metabolic derangements as you are. OP neurology f/u on discharge with Dr. Leta Baptist PT OT and Discharge planning per primary   03/12/2017, 8:51 AM  Attending Neurohospitalist Addendum Patient seen and examined. Agree with the history and physical as documented above. Agree with the plan as documented, which I helped formulate. I have independently seen, obtained history, and review of systems and examined the patient and reviewed the chart including pertinent labs and pertinent imaging.  --- Amie Portland, MD [facility] [phone] If 7pm to 7am, please call on call as listed on AMION.   Addendum after imaging MRI reviewed.  No acute changes other than the small right posterior fossa subdural that was visualized on the CT scan.  Resolution of the supratentorial  subdurals.  Moderate to severe severe parenchymal brain volume loss and severe chronic small vessel ischemic disease with multiple old small vessel infarcts and extensive microhemorrhages possibly as a result of hypertension.  Recommendations Continue him on Depakote Seizure precautions Check Depakote level in the next 5 days with a goal level between 50 and 100. If the Depakote levels are subtherapeutic, he needs to increase the dose. Neurology follow-up in 7-10 days

## 2017-03-12 NOTE — Progress Notes (Signed)
CSW met with pt and pt dtr concerning PT recommendation for SNF.  Patient just admitted to SNF recently and was [facility] this past Friday- not interested in returning.  Per pt dtr pt has almost 24 hours supervision from family while not at dialysis and feel as if they can manage needs at home with home services being restarted- also interested in any aid hours that can be provided through insurance  CSW signing off- RNCM updated on pt/family choice to return home  Jorge Ny, [place] Social Worker [phone]

## 2017-03-12 NOTE — Discharge Instructions (Signed)
Seizure, Adult When you have a seizure:  Parts of your body may move.  How aware or awake (conscious) you are may change.  You may shake (convulse).  Some people have symptoms right before a seizure happens. These symptoms may include:  Fear.  Worry (anxiety).  Feeling like you are going to throw up (nausea).  Feeling like the room is spinning (vertigo).  Feeling like you saw or heard something before (deja vu).  Odd tastes or smells.  Changes in vision, such as seeing flashing lights or spots.  Seizures usually last from 30 seconds to 2 minutes. Usually, they are not harmful unless they last a long time. Follow these instructions at home: Medicines  Take over-the-counter and prescription medicines only as told by your doctor.  Avoid anything that may keep your medicine from working, such as alcohol. Activity  Do not do any activities that would be dangerous if you had another seizure, like driving or swimming. Wait until your doctor approves.  If you live in the [place] ask your local DMV (department of motor vehicles) when you can drive.  Rest. Teaching others  Teach friends and family what to do when you have a seizure. They should: ? Lay you on the ground. ? Protect your head and body. ? Loosen any tight clothing around your neck. ? Turn you on your side. ? Stay with you until you are better. ? Not hold you down. ? Not put anything in your mouth. ? Know whether or not you need emergency care. General instructions  Contact your doctor each time you have a seizure.  Avoid anything that gives you seizures.  Keep a seizure diary. Write down: ? What you think caused each seizure. ? What you remember about each seizure.  Keep all follow-up visits as told by your doctor. This is important. Contact a doctor if:  You have another seizure.  You have seizures more often.  There is any change in what happens during your seizures.  You continue to have  seizures with treatment.  You have symptoms of being sick or having an infection. Get help right away if:  You have a seizure: ? That lasts longer than 5 minutes. ? That is different than seizures you had before. ? That makes it harder to breathe. ? After you hurt your head.  After a seizure, you cannot speak or use a part of your body.  After a seizure, you are confused or have a bad headache.  You have two or more seizures in a row.  You are having seizures more often.  You do not wake up right after a seizure.  You get hurt during a seizure. In an emergency:  These symptoms may be an emergency. Do not wait to see if the symptoms will go away. Get medical help right away. Call your local emergency services (911 in the [place] Do not drive yourself to the hospital. This information is not intended to replace advice given to you by your health care provider. Make sure you discuss any questions you have with your health care provider. Document Released: 08/20/2007 Document Revised: 11/14/2015 Document Reviewed: 11/14/2015 Elsevier Interactive Patient Education  2017 [employer].   Vascular Dementia Dementia is a condition in which a person has problems with thinking, memory, and behavior that are severe enough to interfere with daily life. Vascular dementia is a type of dementia. It results from brain damage that is caused by the brain not getting enough blood. Vascular  dementia usually begins between 57 and 68 years of age. What are the causes? Vascular dementia is caused by conditions that lessen blood flow to the brain. Common causes include:  Multiple small strokes. These may happen without symptoms (silent stroke).  Major stroke.  Damage to small blood vessels in the brain (cerebral small vessel disease).  What increases the risk?  Advancing age.  Having had a stroke.  Having high blood pressure (hypertension) or high cholesterol.  Having a disease that affects  the heart or blood vessels.  Smoking.  Having diabetes.  Being male.  Being obese.  Not being active.  Having depression. What are the signs or symptoms? Symptoms can vary a lot from one person to another. Symptoms may be mild or severe depending on the amount of damage and which parts of the brain have been affected. Symptoms may begin suddenly or may develop gradually. Symptoms may remain stable, or they may get worse over time. Symptoms of vascular dementia may be similar to those of Alzheimer disease. The two conditions can occur together (mixed dementia). Symptoms of vascular dementia may include: Mental  Confusion.  Memory problems.  Poor attention and concentration.  Trouble understanding speech.  Depression.  Personality changes.  Trouble recognizing familiar people.  Agitation or aggression.  Paranoia.  Delusions or hallucinations. Physical  Weakness.  Poor balance.  Loss of bladder or bowel control (incontinence).  Unsteady walking (gait).  Speaking problems. Behavioral  Getting lost in familiar places.  Problems with planning and judgment.  Trouble following instructions.  Social problems.  Emotional outbursts.  Trouble with daily activities and self-care.  Problems handling money. How is this diagnosed? There is not a specific test to diagnose vascular dementia. The health care provider will consider the person's medical history and symptoms or changes that are reported by friends and family. The health care provider will do a physical exam and may order lab tests or other tests that check brain and nervous system function. Tests that may be done include:  Blood tests.  Brain imaging tests.  Tests of movement, speech, and other daily activities (neurological exam).  Tests of memory, thinking, and problem-solving (neuropsychological or neurocognitive testing).  Diagnosis may involve several specialists. These may include a health  care provider who specializes in the brain and nervous system (neurologist), a provider who specializes in disorders of the mind (psychiatrist), and a provider who focuses on speech and language changes (Electrical engineer). How is this treated? There is no cure for vascular dementia. Brain damage that has already occurred cannot be reversed. Treatment depends on:  How severe the condition is.  Which parts of the brain have been affected.  The person's overall health.  Treatment measures aim to:  Treat the underlying cause of vascular dementia and manage risk factors. This may include: ? Controlling blood pressure. ? Lowering cholesterol. ? Treating diabetes. ? Quitting smoking. ? Losing weight.  Manage symptoms.  Prevent further brain damage.  Improve the person's health and quality of life.  Treatment for dementia may involve a team of health care providers, including:  A neurologist.  A psychiatrist.  An occupational therapist.  A speech pathologist.  A cardiologist.  An exercise physiologist or physical therapist.  Follow these instructions at home: Home care for a person with vascular dementia depends on what caused the condition and how severe the symptoms are. General guidelines for care at home include:  Following the health care provider's instructions for treating the condition that caused the  dementia.  Using medicines only as told by the person's health care provider.  Creating a safe living space.  Learning ways to help the person remember people, appointments, and daily activities.  Finding a support group to help caregivers and family to cope with the effects of dementia.  Helping family and friends learn about ways to communicate with a person who has dementia.  Making sure the person keeps all follow-up visits and goes to all rehabilitation appointments as told by the health care team. This is important.  Contact a health care provider if:  A  fever develops.  New behavioral problems develop.  Problems with swallowing develop.  Confusion gets worse.  Sleepiness gets worse. Get help right away if:  Loss of consciousness occurs.  There is a sudden loss of speech, balance, or thinking ability.  New numbness or paralysis occurs.  Sudden, severe headache occurs.  Vision is lost or suddenly gets worse in one or both eyes. This information is not intended to replace advice given to you by your health care provider. Make sure you discuss any questions you have with your health care provider. Document Released: 02/21/2002 Document Revised: 08/09/2015 Document Reviewed: 06/14/2014 Elsevier Interactive Patient Education  2018 [employer].

## 2017-03-13 ENCOUNTER — Telehealth: Payer: Self-pay

## 2017-03-13 NOTE — Care Management Note (Signed)
Case Management Note  Patient Details  Name: JUSTN QUALE MRN: [id number] Date of Birth: 16-Oct-1945  Subjective/Objective:                    Action/Plan: 03/13/2017 at 1 pm:  Pt discharged home late yesterday. Pt was active with [facility] prior to admission and resumption orders completed. Adacia with [facility] aware of d/c.   Expected Discharge Date:  03/12/17               Expected Discharge Plan:  [place]  In-House Referral:     Discharge planning Services  CM Consult  Post Acute Care Choice:  Home Health Choice offered to:     DME Arranged:    DME Agency:     HH Arranged:  RN, PT [place] Agency:  [facility]  Status of Service:  Completed, signed off  If discussed at [place] of Stay Meetings, dates discussed:    Additional Comments:  [name], RN 03/13/2017, 12:57 PM

## 2017-03-13 NOTE — Telephone Encounter (Signed)
TCM hospital follow up called made to patient/wife. Left message for return call to schedule follow up and review hospital visit.

## 2017-03-13 NOTE — Telephone Encounter (Signed)
Copied from [place] [phone]. Topic: General - Other >> Mar 13, 2017 12:23 PM Yvette Rack wrote: Reason for CRM: patient wife calling stating that someone had called her about her appointment for a hospital visit there is no CRM so I checked it says in appointments that patient has one on 03-19-17 for a hospital follow up please call wife back

## 2017-03-13 NOTE — Telephone Encounter (Signed)
Patients wife returned call regarding Hospital FOllow up  Transition Care Management Follow-up Telephone Call  ADMISSION DATE: 03/10/17  DISCHARGE DATE: 03/12/17   How have you been since you were released from the hospital? Patient was shakey yesterday per wife. Went to Dialysis today.    Do you understand why you were in the hospital? Yes per wife    Do you understand the discharge instrcutions? States daughter went over discharge summasry with them. However, when going over medications patient had still be getting medications that were stopped on Discharge summary.    Items Reviewed:  Medications reviewed: Reviewed and CORRECTED per discharge summary. Had not stopped medications patient was taken off of in hospital.   Allergies reviewed: NKDA   Dietary changes reviewed:  Low Sodium   Referrals reviewed: Appointment scheduled for 03/19/16 with E. Saguier   Functional Questionnaire:   Activities of Daily Living (ADLs): Patient needs assistance with ADL's at this time. Can feed himself.  Any patient concerns? Medications and appetite per wife.   Confirmed importance and date/time of follow-up visits scheduled: Yes   Confirmed with patient if condition begins to worsen call PCP or go to the ER. Yes    Patient was given the office number and encouragred to call back with questions or concerns. Yes

## 2017-03-19 ENCOUNTER — Ambulatory Visit (INDEPENDENT_AMBULATORY_CARE_PROVIDER_SITE_OTHER): Payer: Medicare Other | Admitting: Medical

## 2017-03-19 ENCOUNTER — Other Ambulatory Visit: Payer: Self-pay

## 2017-03-19 ENCOUNTER — Encounter: Payer: Self-pay | Admitting: Medical

## 2017-03-19 ENCOUNTER — Telehealth: Payer: Self-pay | Admitting: Medical

## 2017-03-19 ENCOUNTER — Encounter (HOSPITAL_COMMUNITY): Payer: Self-pay | Admitting: Internal Medicine

## 2017-03-19 ENCOUNTER — Encounter (HOSPITAL_COMMUNITY): Payer: Self-pay | Admitting: Emergency Medicine

## 2017-03-19 ENCOUNTER — Inpatient Hospital Stay (HOSPITAL_COMMUNITY)
Admission: EM | Admit: 2017-03-19 | Discharge: 2017-04-02 | DRG: 064 | Disposition: A | Discharge status: Other Acute Inpatient Hospital | Payer: Medicare Other | Attending: Family Medicine | Admitting: Family Medicine

## 2017-03-19 ENCOUNTER — Ambulatory Visit (HOSPITAL_BASED_OUTPATIENT_CLINIC_OR_DEPARTMENT_OTHER)
Admission: RE | Admit: 2017-03-19 | Discharge: 2017-03-19 | Disposition: A | Discharge status: Home / Self Care | Payer: Medicare Other | Source: Ambulatory Visit | Attending: Medical | Admitting: Medical

## 2017-03-19 ENCOUNTER — Telehealth: Payer: Self-pay

## 2017-03-19 VITALS — BP 108/70 | HR 107 | Resp 14

## 2017-03-19 DIAGNOSIS — R4182 Altered mental status, unspecified: Secondary | ICD-10-CM | POA: Diagnosis not present

## 2017-03-19 DIAGNOSIS — N186 End stage renal disease: Secondary | ICD-10-CM

## 2017-03-19 DIAGNOSIS — Z681 Body mass index (BMI) 19 or less, adult: Secondary | ICD-10-CM

## 2017-03-19 DIAGNOSIS — G9340 Encephalopathy, unspecified: Secondary | ICD-10-CM | POA: Diagnosis not present

## 2017-03-19 DIAGNOSIS — R471 Dysarthria and anarthria: Secondary | ICD-10-CM | POA: Diagnosis present

## 2017-03-19 DIAGNOSIS — E860 Dehydration: Secondary | ICD-10-CM | POA: Diagnosis present

## 2017-03-19 DIAGNOSIS — E1129 Type 2 diabetes mellitus with other diabetic kidney complication: Secondary | ICD-10-CM | POA: Diagnosis present

## 2017-03-19 DIAGNOSIS — R000 Tachycardia, unspecified: Secondary | ICD-10-CM | POA: Diagnosis present

## 2017-03-19 DIAGNOSIS — Z833 Family history of diabetes mellitus: Secondary | ICD-10-CM

## 2017-03-19 DIAGNOSIS — Z9181 History of falling: Secondary | ICD-10-CM

## 2017-03-19 DIAGNOSIS — K219 Gastro-esophageal reflux disease without esophagitis: Secondary | ICD-10-CM | POA: Diagnosis present

## 2017-03-19 DIAGNOSIS — Z823 Family history of stroke: Secondary | ICD-10-CM

## 2017-03-19 DIAGNOSIS — I6389 Other cerebral infarction: Principal | ICD-10-CM | POA: Diagnosis present

## 2017-03-19 DIAGNOSIS — E1122 Type 2 diabetes mellitus with diabetic chronic kidney disease: Secondary | ICD-10-CM | POA: Diagnosis present

## 2017-03-19 DIAGNOSIS — Z87820 Personal history of traumatic brain injury: Secondary | ICD-10-CM

## 2017-03-19 DIAGNOSIS — M545 Low back pain: Secondary | ICD-10-CM | POA: Diagnosis present

## 2017-03-19 DIAGNOSIS — E1142 Type 2 diabetes mellitus with diabetic polyneuropathy: Secondary | ICD-10-CM | POA: Diagnosis present

## 2017-03-19 DIAGNOSIS — R627 Adult failure to thrive: Secondary | ICD-10-CM

## 2017-03-19 DIAGNOSIS — I132 Hypertensive heart and chronic kidney disease with heart failure and with stage 5 chronic kidney disease, or end stage renal disease: Secondary | ICD-10-CM | POA: Diagnosis present

## 2017-03-19 DIAGNOSIS — N400 Enlarged prostate without lower urinary tract symptoms: Secondary | ICD-10-CM | POA: Diagnosis present

## 2017-03-19 DIAGNOSIS — D631 Anemia in chronic kidney disease: Secondary | ICD-10-CM | POA: Diagnosis present

## 2017-03-19 DIAGNOSIS — R05 Cough: Secondary | ICD-10-CM

## 2017-03-19 DIAGNOSIS — E785 Hyperlipidemia, unspecified: Secondary | ICD-10-CM | POA: Diagnosis present

## 2017-03-19 DIAGNOSIS — K31819 Angiodysplasia of stomach and duodenum without bleeding: Secondary | ICD-10-CM | POA: Diagnosis present

## 2017-03-19 DIAGNOSIS — R4587 Impulsiveness: Secondary | ICD-10-CM | POA: Diagnosis not present

## 2017-03-19 DIAGNOSIS — Z8546 Personal history of malignant neoplasm of prostate: Secondary | ICD-10-CM

## 2017-03-19 DIAGNOSIS — Z720 Tobacco use: Secondary | ICD-10-CM

## 2017-03-19 DIAGNOSIS — K760 Fatty (change of) liver, not elsewhere classified: Secondary | ICD-10-CM | POA: Diagnosis present

## 2017-03-19 DIAGNOSIS — N189 Chronic kidney disease, unspecified: Secondary | ICD-10-CM

## 2017-03-19 DIAGNOSIS — Z5320 Procedure and treatment not carried out because of patient's decision for unspecified reasons: Secondary | ICD-10-CM | POA: Diagnosis present

## 2017-03-19 DIAGNOSIS — Z7189 Other specified counseling: Secondary | ICD-10-CM

## 2017-03-19 DIAGNOSIS — I420 Dilated cardiomyopathy: Secondary | ICD-10-CM | POA: Diagnosis present

## 2017-03-19 DIAGNOSIS — Z8249 Family history of ischemic heart disease and other diseases of the circulatory system: Secondary | ICD-10-CM

## 2017-03-19 DIAGNOSIS — Z008 Encounter for other general examination: Secondary | ICD-10-CM | POA: Diagnosis not present

## 2017-03-19 DIAGNOSIS — E11319 Type 2 diabetes mellitus with unspecified diabetic retinopathy without macular edema: Secondary | ICD-10-CM | POA: Diagnosis present

## 2017-03-19 DIAGNOSIS — I639 Cerebral infarction, unspecified: Secondary | ICD-10-CM | POA: Diagnosis not present

## 2017-03-19 DIAGNOSIS — F17210 Nicotine dependence, cigarettes, uncomplicated: Secondary | ICD-10-CM | POA: Diagnosis present

## 2017-03-19 DIAGNOSIS — I959 Hypotension, unspecified: Secondary | ICD-10-CM | POA: Diagnosis present

## 2017-03-19 DIAGNOSIS — F329 Major depressive disorder, single episode, unspecified: Secondary | ICD-10-CM | POA: Diagnosis present

## 2017-03-19 DIAGNOSIS — E7800 Pure hypercholesterolemia, unspecified: Secondary | ICD-10-CM | POA: Diagnosis present

## 2017-03-19 DIAGNOSIS — S065X9A Traumatic subdural hemorrhage with loss of consciousness of unspecified duration, initial encounter: Secondary | ICD-10-CM

## 2017-03-19 DIAGNOSIS — I5022 Chronic systolic (congestive) heart failure: Secondary | ICD-10-CM

## 2017-03-19 DIAGNOSIS — S065XAA Traumatic subdural hemorrhage with loss of consciousness status unknown, initial encounter: Secondary | ICD-10-CM

## 2017-03-19 DIAGNOSIS — R410 Disorientation, unspecified: Secondary | ICD-10-CM | POA: Diagnosis not present

## 2017-03-19 DIAGNOSIS — R1310 Dysphagia, unspecified: Secondary | ICD-10-CM | POA: Diagnosis present

## 2017-03-19 DIAGNOSIS — D509 Iron deficiency anemia, unspecified: Secondary | ICD-10-CM | POA: Diagnosis present

## 2017-03-19 DIAGNOSIS — R29709 NIHSS score 9: Secondary | ICD-10-CM | POA: Diagnosis present

## 2017-03-19 DIAGNOSIS — Z8669 Personal history of other diseases of the nervous system and sense organs: Secondary | ICD-10-CM

## 2017-03-19 DIAGNOSIS — R9401 Abnormal electroencephalogram [EEG]: Secondary | ICD-10-CM | POA: Diagnosis present

## 2017-03-19 DIAGNOSIS — Z8673 Personal history of transient ischemic attack (TIA), and cerebral infarction without residual deficits: Secondary | ICD-10-CM | POA: Insufficient documentation

## 2017-03-19 DIAGNOSIS — D72829 Elevated white blood cell count, unspecified: Secondary | ICD-10-CM | POA: Diagnosis not present

## 2017-03-19 DIAGNOSIS — F0151 Vascular dementia with behavioral disturbance: Secondary | ICD-10-CM | POA: Diagnosis present

## 2017-03-19 DIAGNOSIS — Z515 Encounter for palliative care: Secondary | ICD-10-CM

## 2017-03-19 DIAGNOSIS — I2510 Atherosclerotic heart disease of native coronary artery without angina pectoris: Secondary | ICD-10-CM | POA: Diagnosis present

## 2017-03-19 DIAGNOSIS — R059 Cough, unspecified: Secondary | ICD-10-CM

## 2017-03-19 DIAGNOSIS — Z800 Family history of malignant neoplasm of digestive organs: Secondary | ICD-10-CM

## 2017-03-19 DIAGNOSIS — Z66 Do not resuscitate: Secondary | ICD-10-CM | POA: Diagnosis present

## 2017-03-19 DIAGNOSIS — N250 Renal osteodystrophy: Secondary | ICD-10-CM | POA: Diagnosis present

## 2017-03-19 DIAGNOSIS — J309 Allergic rhinitis, unspecified: Secondary | ICD-10-CM | POA: Diagnosis present

## 2017-03-19 DIAGNOSIS — Z79899 Other long term (current) drug therapy: Secondary | ICD-10-CM

## 2017-03-19 DIAGNOSIS — G4733 Obstructive sleep apnea (adult) (pediatric): Secondary | ICD-10-CM | POA: Diagnosis present

## 2017-03-19 DIAGNOSIS — M216X9 Other acquired deformities of unspecified foot: Secondary | ICD-10-CM | POA: Diagnosis present

## 2017-03-19 DIAGNOSIS — E1151 Type 2 diabetes mellitus with diabetic peripheral angiopathy without gangrene: Secondary | ICD-10-CM | POA: Diagnosis present

## 2017-03-19 DIAGNOSIS — G8929 Other chronic pain: Secondary | ICD-10-CM | POA: Diagnosis present

## 2017-03-19 DIAGNOSIS — E43 Unspecified severe protein-calorie malnutrition: Secondary | ICD-10-CM | POA: Diagnosis present

## 2017-03-19 DIAGNOSIS — Z992 Dependence on renal dialysis: Secondary | ICD-10-CM

## 2017-03-19 DIAGNOSIS — D62 Acute posthemorrhagic anemia: Secondary | ICD-10-CM | POA: Diagnosis not present

## 2017-03-19 DIAGNOSIS — N529 Male erectile dysfunction, unspecified: Secondary | ICD-10-CM | POA: Diagnosis present

## 2017-03-19 DIAGNOSIS — R2681 Unsteadiness on feet: Secondary | ICD-10-CM

## 2017-03-19 DIAGNOSIS — Z716 Tobacco abuse counseling: Secondary | ICD-10-CM

## 2017-03-19 LAB — COMPREHENSIVE METABOLIC PANEL
ALT: 10 U/L — ABNORMAL LOW (ref 17–63)
AST: 12 U/L — ABNORMAL LOW (ref 15–41)
Albumin: 3.7 g/dL (ref 3.5–5.0)
Alkaline Phosphatase: 90 U/L (ref 38–126)
Anion gap: 13 (ref 5–15)
BUN: 31 mg/dL — ABNORMAL HIGH (ref 6–20)
CO2: 28 mmol/L (ref 22–32)
CREATININE: 9.91 mg/dL — AB (ref 0.61–1.24)
Calcium: 9.4 mg/dL (ref 8.9–10.3)
Chloride: 100 mmol/L — ABNORMAL LOW (ref 101–111)
GFR, EST AFRICAN AMERICAN: 5 mL/min — AB (ref >60)
GFR, EST NON AFRICAN AMERICAN: 5 mL/min — AB (ref >60)
Glucose, Bld: 199 mg/dL — ABNORMAL HIGH (ref 65–99)
POTASSIUM: 4.9 mmol/L (ref 3.5–5.1)
SODIUM: 141 mmol/L (ref 135–145)
Total Bilirubin: 0.6 mg/dL (ref 0.3–1.2)
Total Protein: 7.8 g/dL (ref 6.5–8.1)

## 2017-03-19 LAB — DIFFERENTIAL
BASOS ABS: 0 10*3/uL (ref 0.0–0.1)
BASOS PCT: 1 %
EOS ABS: 0.1 10*3/uL (ref 0.0–0.7)
Eosinophils Relative: 2 %
Lymphocytes Relative: 30 %
Lymphs Abs: 1.2 10*3/uL (ref 0.7–4.0)
MONO ABS: 0.5 10*3/uL (ref 0.1–1.0)
MONOS PCT: 12 %
NEUTROS ABS: 2.3 10*3/uL (ref 1.7–7.7)
Neutrophils Relative %: 55 %

## 2017-03-19 LAB — I-STAT CHEM 8, ED
BUN: 33 mg/dL — ABNORMAL HIGH (ref 6–20)
CHLORIDE: 99 mmol/L — AB (ref 101–111)
Calcium, Ion: 1.15 mmol/L (ref 1.15–1.40)
Creatinine, Ser: 9.6 mg/dL — ABNORMAL HIGH (ref 0.61–1.24)
Glucose, Bld: 195 mg/dL — ABNORMAL HIGH (ref 65–99)
HCT: 41 % (ref 39.0–52.0)
HEMOGLOBIN: 13.9 g/dL (ref 13.0–17.0)
POTASSIUM: 4.7 mmol/L (ref 3.5–5.1)
SODIUM: 141 mmol/L (ref 135–145)
TCO2: 29 mmol/L (ref 22–32)

## 2017-03-19 LAB — CBC
HCT: 37.9 % — ABNORMAL LOW (ref 39.0–52.0)
Hemoglobin: 12.5 g/dL — ABNORMAL LOW (ref 13.0–17.0)
MCH: 30.9 pg (ref 26.0–34.0)
MCHC: 33 g/dL (ref 30.0–36.0)
MCV: 93.8 fL (ref 78.0–100.0)
PLATELETS: 197 10*3/uL (ref 150–400)
RBC: 4.04 MIL/uL — ABNORMAL LOW (ref 4.22–5.81)
RDW: 18 % — AB (ref 11.5–15.5)
WBC: 4.1 10*3/uL (ref 4.0–10.5)

## 2017-03-19 LAB — CBG MONITORING, ED: GLUCOSE-CAPILLARY: 125 mg/dL — AB (ref 65–99)

## 2017-03-19 LAB — I-STAT TROPONIN, ED: TROPONIN I, POC: 0.01 ng/mL (ref 0.00–0.08)

## 2017-03-19 LAB — PROTIME-INR
INR: 1.09
PROTHROMBIN TIME: 14 s (ref 11.4–15.2)

## 2017-03-19 LAB — ETHANOL

## 2017-03-19 LAB — APTT: APTT: 35 s (ref 24–36)

## 2017-03-19 MED ORDER — ACETAMINOPHEN 650 MG RE SUPP: 650.0000 mg | RECTAL | Status: DC | PRN

## 2017-03-19 MED ORDER — ASPIRIN 325 MG PO TABS
325.0000 mg | ORAL_TABLET | Freq: Every day | ORAL | Status: DC
  Administered 2017-03-28 – 2017-04-02 (×5): 325 mg via ORAL
  Filled 2017-03-19 (×7): qty 1

## 2017-03-19 MED ORDER — LORAZEPAM 2 MG/ML IJ SOLN
INTRAMUSCULAR | Status: AC
  Filled 2017-03-19: qty 1

## 2017-03-19 MED ORDER — STROKE: EARLY STAGES OF RECOVERY BOOK
Freq: Once | Status: DC
  Filled 2017-03-19: qty 1

## 2017-03-19 MED ORDER — ACETAMINOPHEN 325 MG PO TABS: 650.0000 mg | ORAL_TABLET | ORAL | Status: DC | PRN

## 2017-03-19 MED ORDER — HALOPERIDOL LACTATE 5 MG/ML IJ SOLN
1.0000 mg | Freq: Once | INTRAMUSCULAR | Status: AC
  Administered 2017-03-19: 1 mg via INTRAVENOUS
  Filled 2017-03-19: qty 1

## 2017-03-19 MED ORDER — ACETAMINOPHEN 160 MG/5ML PO SOLN: 650.0000 mg | ORAL | Status: DC | PRN

## 2017-03-19 MED ORDER — INSULIN ASPART 100 UNIT/ML ~~LOC~~ SOLN
0.0000 [IU] | SUBCUTANEOUS | Status: DC
  Administered 2017-03-21: 2 [IU] via SUBCUTANEOUS
  Administered 2017-03-21 – 2017-03-22 (×2): 1 [IU] via SUBCUTANEOUS
  Administered 2017-03-22: 2 [IU] via SUBCUTANEOUS
  Administered 2017-03-22 – 2017-03-23 (×2): 1 [IU] via SUBCUTANEOUS
  Administered 2017-03-23: 3 [IU] via SUBCUTANEOUS
  Administered 2017-03-23: 2 [IU] via SUBCUTANEOUS

## 2017-03-19 MED ORDER — ASPIRIN 300 MG RE SUPP
300.0000 mg | Freq: Every day | RECTAL | Status: DC
  Administered 2017-03-26: 300 mg via RECTAL
  Filled 2017-03-19: qty 1

## 2017-03-19 MED ORDER — LORAZEPAM 2 MG/ML IJ SOLN
0.5000 mg | Freq: Once | INTRAMUSCULAR | Status: AC
  Administered 2017-03-19: 0.5 mg via INTRAVENOUS
  Filled 2017-03-19: qty 1

## 2017-03-19 MED ORDER — LORAZEPAM 2 MG/ML IJ SOLN
1.0000 mg | Freq: Once | INTRAMUSCULAR | Status: AC
  Administered 2017-03-19: 1 mg via INTRAVENOUS

## 2017-03-19 NOTE — Patient Instructions (Addendum)
With recent change in mental status, not talking much and agitation during the interview, I do recommend we go ahead and repeat CT stat today.  It will be a hold and call study.  In light of his many medical problems and change in mental status/behavior, I explained that the emergency department evaluation would be preferred choice.  However, [place]  is refusing this and he states that he will not allow any labs to be done today.  So we need to watch him closely.  If abnormal CT of head study then he might need to be seen in the emergency department.  If his behavior worsens or other symptoms occur then would recommend the emergency department as well.  Please call tomorrow and give me an update on how he is doing.  If provided no changes in CT he will keep the appointment with neurosurgeon early next week.  I also will go ahead and put in referral to psychiatry.  However those can take some time.  With follow-up in 7-10 days or as needed.

## 2017-03-19 NOTE — ED Notes (Signed)
Per wife the pt receives dialysis every Monday, Wednesday, and Friday. Pt is due for Dialysis tomorrow.

## 2017-03-19 NOTE — Telephone Encounter (Unsigned)
Copied from [place] [phone]. Topic: Quick Communication - See Telephone Encounter >> Mar 19, 2017 11:09 AM Percell Belt A wrote: CRM for notification. See Telephone encounter for: Sharyn Lull with [facility] home health [phone] Needing verbals for nursing orders  1 week 2 2 week 2 1 week 5  PT, OT , speech, social worker and aid Eval   03/19/17.

## 2017-03-19 NOTE — H&P (Addendum)
History and Physical    [name]:[id number] DOB: October 25, 1945 DOA: 03/19/2017  PCP: Mackie Pai, PA-C  Patient coming from: Home.  Chief Complaint: Abnormal CT head.  HPI: [name] is a 72 y.o. male with history of ESRD on hemodialysis Monday with today, hypertension, diabetes mellitus on diet who was recently admitted for acute encephalopathy and placed on Depakote and followed up with his PCP today and had CT head done because patient was found to be increasingly confused last 2 days.  CT head was showing possible left cerebellar stroke and patient was referred to the ER.  Patient recently admitted for acute encephalopathy and cause was not clear.  Had extensive workup done and eventually was discharged on Depakote.  As per the wife who provided history the ER physician patient became more confused last 2 days.  ED Course: In the ER patient was appearing nonfocal but started becoming more agitated and had to be given Ativan and Haldol.  At the time of my exam patient is sedated.  Neurologist on call Dr. Cheral Marker has been consulted and patient admitted for further management of possible stroke.   Review of Systems: As per HPI, rest all negative.   Past Medical History:  Diagnosis Date  . 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 Anemia, iron deficiency 03/24/2011   a. Recurrent GI bleed, tx with periodic iron infusions.  . 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 AVM (arteriovenous malformation) of duodenum, acquired    egds in 01/2012, 04/2011  . BPH (benign prostatic hyperplasia)   . CAD (coronary artery disease)    a. Cath 09/2012: moderate borderline CAD in mid LAD/small diagonal branch, mild RCA stenosis, to be managed medically   . Chronic lower back pain   . Diabetic peripheral neuropathy ([place]) 2014   foot pain.  . Essential hypertension 02/01/2012  . Fatty liver    on CT of 11/2010  . GERD (gastroesophageal reflux disease)   . GI bleed    a. Recurrent GI bleed,  tx with IV iron. b. Per heme notes - likely AVMs 04/2012 (tx with cauterization several months ago).  . Hematuria    a. Urology note scan from 07/2012: cystoscopy without evidence for bladder lesion, only lateral hypertrophy of posterior urethra, bladder impression from BPH. b. Pt states he had "some tests" scheduled for later in July 2014.  . High cholesterol   . Hyperlipidemia 02/01/2012  . Hypertension   . Hypertensive urgency 07/06/2016  . 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 Orthostatic hypotension    a. Tx with florinef.  . Peripheral neuropathy   . Polyp, colonic    Colonoscopy 01/2012 "benign" polyp  . Prostate cancer ([place])   . Sleep apnea    "had mask; couldn't sleep in it" (09/20/2012)  . Smoker   . SOB (shortness of breath) 07/06/2016  . Stage III chronic kidney disease ([name])    a. Stage 3 (DM with complications ->CKD, peripheral neuropathy).  . Stroke ([place])    x 2  . Symptomatic anemia 04/03/2015  . Tinea pedis 05/24/2012  . Tobacco use 02/01/2012  . Type 2 diabetes, uncontrolled, with neuropathy ([place]) 02/01/2012  . Type II diabetes mellitus ([place])    a. Dx 1994, uncontrolled.     Past Surgical History:  Procedure Laterality Date  . ENTEROSCOPY N/A 03/01/2013   Procedure: ENTEROSCOPY;  Surgeon: Inda Castle, MD;  Location: [facility];  Service: Endoscopy;  Laterality: N/A;  . ENTEROSCOPY N/A  06/26/2016   Procedure: ENTEROSCOPY;  Surgeon: Milus Banister, MD;  Location: [place];  Service: Endoscopy;  Laterality: N/A;  this is a push enteroscopy  . ENTEROSCOPY N/A 10/23/2016   Procedure: ENTEROSCOPY;  Surgeon: Gatha Mayer, MD;  Location: [facility] ENDOSCOPY;  Service: Endoscopy;  Laterality: N/A;  . GIVENS CAPSULE STUDY N/A 04/05/2015   Procedure: GIVENS CAPSULE STUDY;  Surgeon: Gatha Mayer, MD;  Location: [place];  Service: Endoscopy;  Laterality: N/A;  . INGUINAL HERNIA REPAIR Right 2011  . LEFT HEART CATH AND CORONARY ANGIOGRAPHY N/A 12/23/2016     Procedure: LEFT HEART CATH AND CORONARY ANGIOGRAPHY;  Surgeon: Leonie Man, MD;  Location: [facility];  Service: Cardiovascular;  Laterality: N/A;  . LEFT HEART CATHETERIZATION WITH CORONARY ANGIOGRAM N/A 09/21/2012   Procedure: LEFT HEART CATHETERIZATION WITH CORONARY ANGIOGRAM;  Surgeon: Peter M Martinique, MD;  Location: [facility] CATH LAB;  Service: Cardiovascular;  Laterality: N/A;  . LUMBAR DISC SURGERY  1980's  . LYMPHADENECTOMY Bilateral 01/19/2013   Procedure: LYMPHADENECTOMY;  Surgeon: Bernestine Amass, MD;  Location: [facility];  Service: Urology;  Laterality: Bilateral;  . ROBOT ASSISTED LAPAROSCOPIC RADICAL PROSTATECTOMY N/A 01/19/2013   Procedure: ROBOTIC ASSISTED LAPAROSCOPIC RADICAL PROSTATECTOMY;  Surgeon: Bernestine Amass, MD;  Location: [facility];  Service: Urology;  Laterality: N/A;  . SHOULDER OPEN ROTATOR CUFF REPAIR Left 1980's     reports that he has been smoking cigarettes.  He started smoking about 48 years ago. He has a 15.00 pack-year smoking history. he has never used smokeless tobacco. He reports that he does not drink alcohol or use drugs.  No Known Allergies  Family History  Problem Relation Age of Onset  . Stroke Father        Died at 23  . Diabetes Mother   . Heart attack Brother        Died at 44  . Diabetes Sister   . Stomach cancer Brother   . Heart attack Sister     Prior to Admission medications   Medication Sig Start Date End Date Taking? Authorizing Provider  ARTIFICIAL TEAR SOLUTION OP Instill 1-2 drops into both eyes two to three times a day as needed for dry eyes   Yes [provider]  atorvastatin (LIPITOR) 80 MG tablet Take 80 mg by mouth at bedtime.   Yes [provider]  carvedilol (COREG) 3.125 MG tablet Take 3.125 mg by mouth 2 (two) times daily with a meal.   Yes [provider]  dronabinol (MARINOL) 2.5 MG capsule Take 2.5 mg by mouth once a day 03/06/17  Yes [provider]  multivitamin (RENA-VIT) TABS  tablet Take 1 tablet by mouth every Monday, Wednesday, and Friday.    Yes [provider]  ranitidine (ZANTAC) 150 MG capsule Take 1 capsule (150 mg total) by mouth 2 (two) times daily. 01/06/17  Yes Saguier, Percell Miller, PA-C  venlafaxine (EFFEXOR) 37.5 MG tablet Take 37.5 mg by mouth every morning.   Yes [provider]    Physical Exam: Vitals:   03/19/17 1845 03/19/17 1900 03/19/17 1915 03/19/17 1945  BP: 124/64 119/73 137/85 127/73  Pulse: (!) 51 79 81   Resp: 19 18 15 17   Temp:      TempSrc:      SpO2:  98% 100%   Weight:      Height:          Constitutional: Moderately built and nourished. Vitals:   03/19/17 1845 03/19/17  1900 03/19/17 1915 03/19/17 1945  BP: 124/64 119/73 137/85 127/73  Pulse: (!) 51 79 81   Resp: 19 18 15 17   Temp:      TempSrc:      SpO2:  98% 100%   Weight:      Height:       Eyes: Anicteric no pallor. ENMT: No discharge from the ears eyes nose or mouth. Neck: No mass felt.  No neck rigidity. Respiratory: No rhonchi or crepitations. Cardiovascular: S1-S2 heard no murmurs appreciated. Abdomen: Soft nontender bowel sounds present. Musculoskeletal: No edema.  No joint effusion. Skin: No rash.  Skin appears warm. Neurologic: Patient is very lethargic from the sedating medications received.  Does not follow commands but moves all extremities.  Pupils are reacting to light.  No obvious facial asymmetry seen. Psychiatric: Appears confused.   Labs on Admission: I have personally reviewed following labs and imaging studies  CBC: Recent Labs  Lab 03/19/17 1759 03/19/17 1825  WBC 4.1  --   NEUTROABS 2.3  --   HGB 12.5* 13.9  HCT 37.9* 41.0  MCV 93.8  --   PLT 197  --    Basic Metabolic Panel: Recent Labs  Lab 03/19/17 1759 03/19/17 1825  NA 141 141  K 4.9 4.7  CL 100* 99*  CO2 28  --   GLUCOSE 199* 195*  BUN 31* 33*  CREATININE 9.91* 9.60*  CALCIUM 9.4  --    GFR: Estimated Creatinine Clearance: 5.5 mL/min (A) (by  C-G formula based on SCr of 9.6 mg/dL (H)). Liver Function Tests: Recent Labs  Lab 03/19/17 1759  AST 12*  ALT 10*  ALKPHOS 90  BILITOT 0.6  PROT 7.8  ALBUMIN 3.7   No results for input(s): LIPASE, AMYLASE in the last 168 hours. No results for input(s): AMMONIA in the last 168 hours. Coagulation Profile: Recent Labs  Lab 03/19/17 1759  INR 1.09   Cardiac Enzymes: No results for input(s): CKTOTAL, CKMB, CKMBINDEX, TROPONINI in the last 168 hours. BNP (last 3 results) Recent Labs    07/09/16 1438 07/31/16 1104 08/19/16 1155  PROBNP 1,044.0* 343.0* 349.0*   HbA1C: No results for input(s): HGBA1C in the last 72 hours. CBG: Recent Labs  Lab 03/19/17 1747  GLUCAP 125*   Lipid Profile: No results for input(s): CHOL, HDL, LDLCALC, TRIG, CHOLHDL, LDLDIRECT in the last 72 hours. Thyroid Function Tests: No results for input(s): TSH, T4TOTAL, FREET4, T3FREE, THYROIDAB in the last 72 hours. Anemia Panel: No results for input(s): VITAMINB12, FOLATE, FERRITIN, TIBC, IRON, RETICCTPCT in the last 72 hours. Urine analysis:    Component Value Date/Time   COLORURINE YELLOW 04/01/2016 0939   APPEARANCEUR CLEAR 04/01/2016 0939   LABSPEC >=1.030 (A) 04/01/2016 0939   PHURINE 5.5 04/01/2016 0939   GLUCOSEU NEGATIVE 04/01/2016 0939   HGBUR MODERATE (A) 04/01/2016 0939   BILIRUBINUR SMALL (A) 04/01/2016 0939   BILIRUBINUR small 06/21/2012 1041   KETONESUR TRACE (A) 04/01/2016 0939   PROTEINUR >300 (A) 12/25/2015 1028   UROBILINOGEN 0.2 04/01/2016 0939   NITRITE NEGATIVE 04/01/2016 0939   LEUKOCYTESUR NEGATIVE 04/01/2016 0939   Sepsis Labs: @LABRCNTIP (procalcitonin:4,lacticidven:4) ) Recent Results (from the past 240 hour(s))  MRSA PCR Screening     Status: None   Collection Time: 03/10/17  8:46 PM  Result Value Ref Range Status   MRSA by PCR NEGATIVE NEGATIVE Final    Comment:        The GeneXpert MRSA Assay (FDA approved for NASAL specimens only), is  one component of  a comprehensive MRSA colonization surveillance program. It is not intended to diagnose MRSA infection nor to guide or monitor treatment for MRSA infections.      Radiological Exams on Admission: Ct Head Wo Contrast  Result Date: 03/19/2017 CLINICAL DATA:  72 year old male for follow-up of subdural hematoma. Unchanged altered mental status and encephalopathy. EXAM: CT HEAD WITHOUT CONTRAST TECHNIQUE: Contiguous axial images were obtained from the base of the skull through the vertex without intravenous contrast. COMPARISON:  03/12/2017 MR, 03/10/2017 CT and prior studies FINDINGS: Brain: The right posterior fossa subdural collection is no longer visualized. A new 1 cm hypodensity within the left cerebellum (image 7 -8) likely represents new ischemic changes/infarct or edema. Atrophy, chronic small-vessel white matter ischemic changes and remote bilateral basal ganglia and thalamic infarcts again noted. No new hemorrhage or extra-axial collection noted. Vascular: Atherosclerotic calcifications noted Skull: No acute abnormality Sinuses/Orbits: No acute abnormality Other: None IMPRESSION: 1. New 1 cm left cerebellar hypodensity which may represent ischemic changes/infarct/edema. No hemorrhage. 2. Interval resolution of right posterior fossa subdural collection. No evidence of new hemorrhage or extra-axial collection. 3. Atrophy, chronic small-vessel white matter ischemic changes and remote basal ganglia/thalamic infarcts. Electronically Signed   By: Margarette Canada M.D.   On: 03/19/2017 15:32    EKG: Independently reviewed.  Sinus tachycardia.  Assessment/Plan Principal Problem:   Acute CVA (cerebrovascular accident) ([place]) Active Problems:   Anemia of renal disease   ESRD (end stage renal disease) ([place])   Acute encephalopathy   DM (diabetes mellitus), type 2 with renal complications ([place])    1. Acute cerebellar stroke -appreciate neurology consult.  MRI/MRA brain 2D echo carotid Doppler has been  ordered.  Will need speech therapist for swallow evaluation.  Physical therapy consult check hemoglobin A1c and lipid panel.  Patient on aspirin for now.  Will need to take Lipitor once patient passes swallow. 2. Acute encephalopathy likely from stroke and also has been having recent episodes of confusion and agitation.  Check EEG.  Since patient recently was on Depakote we will check Depakote levels and ammonia levels.  Will need to discuss with patient's wife to see if patient still takes Depakote. 3. Diabetes mellitus type 2 on diet.  Patient is placed on sliding scale coverage.  Follow hemoglobin A1c. 4. ESRD on hemodialysis on Monday Wednesday Friday -please consult nephrology for dialysis. 5. Hypertension on metoprolol -patient need to be continued once patient passes swallow.  Allow for permissive hypertension. 6. Anemia likely from ESRD -follow CBC. 7. Recent history of traumatic subdural hematoma.   DVT prophylaxis: SCDs for now since patient has cerebellar stroke. Code Status: Full code. Family Communication: Discussed with patient. Disposition Plan: Home. Consults called: Neurology. Admission status: Inpatient.   Rise Patience MD [facility] Pager [phone].  If 7PM-7AM, please contact night-coverage [email or web address] Password [facility]  03/19/2017, 11:33 PM

## 2017-03-19 NOTE — ED Notes (Signed)
Dr. Goldston at bedside.  

## 2017-03-19 NOTE — ED Triage Notes (Signed)
Per EMS pt came from home.  Earlier today he was seen at [facility] because the last two days his daughter states that he has become more alterned. He does have dementia. Today he got a CT of his head.  Afterwards he wanted to leave with his daughter.  When he got home Med center called him and tole him to come to our hospital to be seen due to a cranial infarct. NAD noted at this time. Disoriented to month/year oriented to self.
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
## 2017-03-19 NOTE — ED Notes (Signed)
Mits applied to both hands for safety. Medication starting to slow pt down. Still moving arm, trying to pull at IV and monitor cords, but unable to due to mits. VSS. Wife at bedside. Will keep curtain and door open to visualize pt. Will continue to monitor.

## 2017-03-19 NOTE — Consult Note (Signed)
Referring Physician: Dr. Regenia Skeeter    Chief Complaint: Confusion. New cerebellar stroke on CT  HPI: [name] is an 72 y.o. male with dementia and traumatic subdural hematoma in November of 2018 who presented from home via EMS with AMS. His daughter stated that for the last two days he has become more altered. Today he got a CT of his head. Afterwards he wanted to leave with his daughter. When he got home they were called and told to come to our hospital to be seen due to a new ischemic infarction which was seen on CT. He has ESRD and is on dialysis MWF; due for dialysis tomorrow.   The patient became combative at about 7:00 PM this evening, requiring Haldol and Ativan as well as mitts to both hands for safety.    PCP note from today: "Pt in for follow up. Pt has not been talking. He has history of recent encephalopathy.He has not been agitated or combative as he was before most recent hospitalization. He is not very talking a lot at all to wife. Wife thinks he may be in pain. Pt will not talk with me or answer any questions. Wfie states he was talking less since hospitalization. Pt had history of subdural hematoma in past mid November with facial fracture secondary to fall in November. Most recently hospitalist recommended repeat  Scan of head be repeat around Mar 23, 2016. Pt hadd eeg done on last hospitalization no eliptiform activity. Pt was prescribed depakote but never filled by his pharmacy. Pt wife states no longer has combative behavior."  Past Medical History:  Diagnosis Date  . 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 Anemia, iron deficiency 03/24/2011   a. Recurrent GI bleed, tx with periodic iron infusions.  . 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 AVM (arteriovenous malformation) of duodenum, acquired    egds in 01/2012, 04/2011  . BPH (benign prostatic hyperplasia)   . CAD (coronary artery disease)    a. Cath 09/2012: moderate borderline CAD in mid LAD/small diagonal branch, mild  RCA stenosis, to be managed medically   . Chronic lower back pain   . Diabetic peripheral neuropathy ([place]) 2014   foot pain.  . Essential hypertension 02/01/2012  . Fatty liver    on CT of 11/2010  . GERD (gastroesophageal reflux disease)   . GI bleed    a. Recurrent GI bleed, tx with IV iron. b. Per heme notes - likely AVMs 04/2012 (tx with cauterization several months ago).  . Hematuria    a. Urology note scan from 07/2012: cystoscopy without evidence for bladder lesion, only lateral hypertrophy of posterior urethra, bladder impression from BPH. b. Pt states he had "some tests" scheduled for later in July 2014.  . High cholesterol   . Hyperlipidemia 02/01/2012  . Hypertension   . Hypertensive urgency 07/06/2016  . 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 Orthostatic hypotension    a. Tx with florinef.  . Peripheral neuropathy   . Polyp, colonic    Colonoscopy 01/2012 "benign" polyp  . Prostate cancer ([place])   . Sleep apnea    "had mask; couldn't sleep in it" (09/20/2012)  . Smoker   . SOB (shortness of breath) 07/06/2016  . Stage III chronic kidney disease ([name])    a. Stage 3 (DM with complications ->CKD, peripheral neuropathy).  . Stroke ([place])    x 2  . Symptomatic anemia 04/03/2015  . Tinea pedis 05/24/2012  . Tobacco use 02/01/2012  .  Type 2 diabetes, uncontrolled, with neuropathy ([place]) 02/01/2012  . Type II diabetes mellitus ([place])    a. Dx 1994, uncontrolled.     Past Surgical History:  Procedure Laterality Date  . ENTEROSCOPY N/A 03/01/2013   Procedure: ENTEROSCOPY;  Surgeon: Inda Castle, MD;  Location: [place];  Service: Endoscopy;  Laterality: N/A;  . ENTEROSCOPY N/A 06/26/2016   Procedure: ENTEROSCOPY;  Surgeon: Milus Banister, MD;  Location: [place];  Service: Endoscopy;  Laterality: N/A;  this is a push enteroscopy  . ENTEROSCOPY N/A 10/23/2016   Procedure: ENTEROSCOPY;  Surgeon: Gatha Mayer, MD;  Location: [facility] ENDOSCOPY;  Service:  Endoscopy;  Laterality: N/A;  . GIVENS CAPSULE STUDY N/A 04/05/2015   Procedure: GIVENS CAPSULE STUDY;  Surgeon: Gatha Mayer, MD;  Location: [place];  Service: Endoscopy;  Laterality: N/A;  . INGUINAL HERNIA REPAIR Right 2011  . LEFT HEART CATH AND CORONARY ANGIOGRAPHY N/A 12/23/2016   Procedure: LEFT HEART CATH AND CORONARY ANGIOGRAPHY;  Surgeon: Leonie Man, MD;  Location: [place] CV LAB;  Service: Cardiovascular;  Laterality: N/A;  . LEFT HEART CATHETERIZATION WITH CORONARY ANGIOGRAM N/A 09/21/2012   Procedure: LEFT HEART CATHETERIZATION WITH CORONARY ANGIOGRAM;  Surgeon: Peter M Martinique, MD;  Location: [facility] CATH LAB;  Service: Cardiovascular;  Laterality: N/A;  . LUMBAR DISC SURGERY  1980's  . LYMPHADENECTOMY Bilateral 01/19/2013   Procedure: LYMPHADENECTOMY;  Surgeon: Bernestine Amass, MD;  Location: [facility];  Service: Urology;  Laterality: Bilateral;  . ROBOT ASSISTED LAPAROSCOPIC RADICAL PROSTATECTOMY N/A 01/19/2013   Procedure: ROBOTIC ASSISTED LAPAROSCOPIC RADICAL PROSTATECTOMY;  Surgeon: Bernestine Amass, MD;  Location: [facility];  Service: Urology;  Laterality: N/A;  . SHOULDER OPEN ROTATOR CUFF REPAIR Left 1980's    Family History  Problem Relation Age of Onset  . Stroke Father        Died at 11  . Diabetes Mother   . Heart attack Brother        Died at 46  . Diabetes Sister   . Stomach cancer Brother   . Heart attack Sister    Social History:  reports that he has been smoking cigarettes.  He started smoking about 48 years ago. He has a 15.00 pack-year smoking history. he has never used smokeless tobacco. He reports that he does not drink alcohol or use drugs.  Allergies: No Known Allergies  Home Medications:    ROS: Unable to obtain due to AMS.   Physical Examination: Blood pressure 127/73, pulse 81, temperature 98.7 F (37.1 C), temperature source Oral, resp. rate 17, height _0  (1.702 m), weight 54.4 kg (120 lb), SpO2 100 %.  HEENT: [place]/AT Lungs: Respirations  unlabored Ext: Warm and well perfused  Neurologic Examination: Mental Status: [place]. [place]. Decreased spontaneous movement. Non-agitated. Decreased attention. Follows only some simple commands, often requiring repetition. Does not attempt to communicate.  Cranial Nerves: II, III,IV, VI::  Fixates on and intermittently tracks examiner. No definite visual field cut. No nystagmus. No ptosis. VII: Face is symmetric. Does not grimace or smile to command.  VIII: Responds to some questions. IX,X: Unable to assess XI: Head at midline XII: Does not protrude tongue to command Motor/Sensory: Weakly moves upper extremities to command bilaterally without gross asymmetry.  Withdraws bilateral lower extremities to plantar stimulation without gross asymmetry.  Deep Tendon Reflexes:  Decreased LUE reflexes relative to RUE.  Plantars: Equivocal  Cerebellar: Does not follow commands for testing.  Gait: Unable to assess.   Results for  orders placed or performed during the hospital encounter of 03/19/17 (from the past 48 hour(s))  CBG monitoring, ED     Status: Abnormal   Collection Time: 03/19/17  5:47 PM  Result Value Ref Range   Glucose-Capillary 125 (H) 65 - 99 mg/dL  Ethanol     Status: None   Collection Time: 03/19/17  5:59 PM  Result Value Ref Range   Alcohol, Ethyl (B) <10 <10 mg/dL    Comment:        LOWEST DETECTABLE LIMIT FOR SERUM ALCOHOL IS 10 mg/dL FOR MEDICAL PURPOSES ONLY   Protime-INR     Status: None   Collection Time: 03/19/17  5:59 PM  Result Value Ref Range   Prothrombin Time 14.0 11.4 - 15.2 seconds   INR 1.09   APTT     Status: None   Collection Time: 03/19/17  5:59 PM  Result Value Ref Range   aPTT 35 24 - 36 seconds  CBC     Status: Abnormal   Collection Time: 03/19/17  5:59 PM  Result Value Ref Range   WBC 4.1 4.0 - 10.5 K/uL   RBC 4.04 (L) 4.22 - 5.81 MIL/uL   Hemoglobin 12.5 (L) 13.0 - 17.0 g/dL   HCT 37.9 (L) 39.0 - 52.0 %   MCV 93.8 78.0 - 100.0 fL   MCH  30.9 26.0 - 34.0 pg   MCHC 33.0 30.0 - 36.0 g/dL   RDW 18.0 (H) 11.5 - 15.5 %   Platelets 197 150 - 400 K/uL  Differential     Status: None   Collection Time: 03/19/17  5:59 PM  Result Value Ref Range   Neutrophils Relative % 55 %   Neutro Abs 2.3 1.7 - 7.7 K/uL   Lymphocytes Relative 30 %   Lymphs Abs 1.2 0.7 - 4.0 K/uL   Monocytes Relative 12 %   Monocytes Absolute 0.5 0.1 - 1.0 K/uL   Eosinophils Relative 2 %   Eosinophils Absolute 0.1 0.0 - 0.7 K/uL   Basophils Relative 1 %   Basophils Absolute 0.0 0.0 - 0.1 K/uL  Comprehensive metabolic panel     Status: Abnormal   Collection Time: 03/19/17  5:59 PM  Result Value Ref Range   Sodium 141 135 - 145 mmol/L   Potassium 4.9 3.5 - 5.1 mmol/L   Chloride 100 (L) 101 - 111 mmol/L   CO2 28 22 - 32 mmol/L   Glucose, Bld 199 (H) 65 - 99 mg/dL   BUN 31 (H) 6 - 20 mg/dL   Creatinine, Ser 9.91 (H) 0.61 - 1.24 mg/dL   Calcium 9.4 8.9 - 10.3 mg/dL   Total Protein 7.8 6.5 - 8.1 g/dL   Albumin 3.7 3.5 - 5.0 g/dL   AST 12 (L) 15 - 41 U/L   ALT 10 (L) 17 - 63 U/L   Alkaline Phosphatase 90 38 - 126 U/L   Total Bilirubin 0.6 0.3 - 1.2 mg/dL   GFR calc non Af Amer 5 (L) >60 mL/min   GFR calc Af Amer 5 (L) >60 mL/min    Comment: (NOTE) The eGFR has been calculated using the CKD EPI equation. This calculation has not been validated in all clinical situations. eGFR's persistently <60 mL/min signify possible Chronic Kidney Disease.    Anion gap 13 5 - 15  I-stat troponin, ED     Status: None   Collection Time: 03/19/17  6:23 PM  Result Value Ref Range   Troponin i, poc 0.01 0.00 -  0.08 ng/mL   Comment 3            Comment: Due to the release kinetics of cTnI, a negative result within the first hours of the onset of symptoms does not rule out myocardial infarction with certainty. If myocardial infarction is still suspected, repeat the test at appropriate intervals.   I-Stat Chem 8, ED     Status: Abnormal   Collection Time: 03/19/17   6:25 PM  Result Value Ref Range   Sodium 141 135 - 145 mmol/L   Potassium 4.7 3.5 - 5.1 mmol/L   Chloride 99 (L) 101 - 111 mmol/L   BUN 33 (H) 6 - 20 mg/dL   Creatinine, Ser 9.60 (H) 0.61 - 1.24 mg/dL   Glucose, Bld 195 (H) 65 - 99 mg/dL   Calcium, Ion 1.15 1.15 - 1.40 mmol/L   TCO2 29 22 - 32 mmol/L   Hemoglobin 13.9 13.0 - 17.0 g/dL   HCT 41.0 39.0 - 52.0 %   Ct Head Wo Contrast  Result Date: 03/19/2017 CLINICAL DATA:  72 year old male for follow-up of subdural hematoma. Unchanged altered mental status and encephalopathy. EXAM: CT HEAD WITHOUT CONTRAST TECHNIQUE: Contiguous axial images were obtained from the base of the skull through the vertex without intravenous contrast. COMPARISON:  03/12/2017 MR, 03/10/2017 CT and prior studies FINDINGS: Brain: The right posterior fossa subdural collection is no longer visualized. A new 1 cm hypodensity within the left cerebellum (image 7 -8) likely represents new ischemic changes/infarct or edema. Atrophy, chronic small-vessel white matter ischemic changes and remote bilateral basal ganglia and thalamic infarcts again noted. No new hemorrhage or extra-axial collection noted. Vascular: Atherosclerotic calcifications noted Skull: No acute abnormality Sinuses/Orbits: No acute abnormality Other: None IMPRESSION: 1. New 1 cm left cerebellar hypodensity which may represent ischemic changes/infarct/edema. No hemorrhage. 2. Interval resolution of right posterior fossa subdural collection. No evidence of new hemorrhage or extra-axial collection. 3. Atrophy, chronic small-vessel white matter ischemic changes and remote basal ganglia/thalamic infarcts. Electronically Signed   By: Margarette Canada M.D.   On: 03/19/2017 15:32    Assessment: 72 y.o. male with dementia and a 2 day history of worsened mentation 1. Exam reveals no definite lateralizing findings. AMS localizes as diffuse bihemispheric cerebral dysfunction. No clinical seizure activity on exam. No neck stiffness,  fever or white count to suggest a meningitis.  2. CT head reveals subacute small ischemic infarction left cerebellar hemisphere. No mass effect.  3. CT head also reveals interval resolution of right posterior fossa subdural collection. No evidence of new hemorrhage or extra-axial collection. Atrophy, chronic small-vessel white matter ischemic changes and remote basal ganglia/thalamic infarcts are noted.   Plan: 1. Repeat CT head in 24 hours, or obtain MRI brain to better characterize the subacute left cerebellar hemisphere infarcton.  2. EEG in the morning.  3. Toxic/metabolic work up.  4. Given resolution of subdural hemorrhagic fluid collection on CT, can restart on ASA for stroke prophylaxis 5. Continue Lipitor.  6. Telemetry monitoring  _0  signed: Dr. Kerney Elbe  03/19/2017, 8:51 PM

## 2017-03-19 NOTE — Telephone Encounter (Signed)
Notified pt's wife per PCP for pt to go to the hospital. Pt wife also notified time is very important with brain conditions she should take him soon as possible. Pt wife states she will go home and call EMS.

## 2017-03-19 NOTE — Progress Notes (Signed)
Subjective:    Patient ID: [name], male    DOB: Feb 08, 1946, 72 y.o.   MRN: [id number]  HPI  Pt in for follow up.  Pt has not been talking. He has history of recent encephalopathy.He has not been agitated or combative as he was before most recent hospitalization. He is not very talking a lot at all to wife. Wife thinks he may be in pain. Pt will not talk with me or answer any questions. Wfie states he was talking less since hospitalization.  Pt had history of subdural hematoma in past mid November with facial fracture secondary to fall in November. Most recently hospitalist recommended repeat  Scan of head be repeat around Mar 23, 2016.  Pt hadd eeg done on last hospitalization no eliptiform activity.  Pt was prescribed depakote but never filled by his pharmacy. Pt wife states no longer has combative behavior    From hospitalization. Acute metabolic encephalopathy/Possible Seizures/Vascular Dementia Etiology remains unclear. But it appears that this could be a manifestation of seizure disorder based on his symptoms. Patient has periods of agitation and combativeness and once he is given medications he calms down and returns back to baseline. In view of this neurology was consulted. Patient was empirically placed on Depakote. EEG did not show any epileptiform activity. B12, ammonia, TSH all normal. HIV and RPR is negative.MRI brain did not reveal any acute stroke.  Discussed with neurology.  Patient does not need any further workup.  He will be discharged home on twice a day Depakote.  Level will need to be checked at his PCPs office at follow-up.  If subtherapeutic he may need a higher dose of Depakote.Seen by physical therapy and skilled nursing facility was recommended. However it appears that the patient and his family were not interested in same. Home health will be ordered. Will need to consider psychiatry evaluation as an outpatient.  Vascular dementia is another  possibility considering atrophy and small vessel disease noted on MRI.   Pt has neurologist appointment on March 24, 2016.  He has dialysis tomorrow. Pt does not have wheel chair.     Review of Systems  Constitutional: Positive for fatigue. Negative for chills and fever.       Sleeping more than usual. Lethargic.  HENT: Negative for congestion and drooling.   Respiratory: Negative for cough, chest tightness, shortness of breath and wheezing.   Cardiovascular: Negative for chest pain and palpitations.  Gastrointestinal: Negative for abdominal distention and abdominal pain.  Musculoskeletal: Negative for back pain, gait problem, myalgias, neck pain and neck stiffness.  Skin: Negative for rash.  Neurological: Negative for dizziness, seizures, weakness, numbness and headaches.       Not talking agitated.  Hematological: Negative for adenopathy. Does not bruise/bleed easily.  Psychiatric/Behavioral: Negative for behavioral problems, confusion, decreased concentration and sleep disturbance. The patient is not nervous/anxious.     Past Medical History:  Diagnosis Date  . 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 Anemia, iron deficiency 03/24/2011   a. Recurrent GI bleed, tx with periodic iron infusions.  . 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 AVM (arteriovenous malformation) of duodenum, acquired    egds in 01/2012, 04/2011  . BPH (benign prostatic hyperplasia)   . CAD (coronary artery disease)    a. Cath 09/2012: moderate borderline CAD in mid LAD/small diagonal branch, mild RCA stenosis, to be managed medically   . Chronic lower back pain   . Diabetic peripheral neuropathy ([place]) 2014  foot pain.  . Essential hypertension 02/01/2012  . Fatty liver    on CT of 11/2010  . GERD (gastroesophageal reflux disease)   . GI bleed    a. Recurrent GI bleed, tx with IV iron. b. Per heme notes - likely AVMs 04/2012 (tx with cauterization several months ago).  . Hematuria    a. Urology  note scan from 07/2012: cystoscopy without evidence for bladder lesion, only lateral hypertrophy of posterior urethra, bladder impression from BPH. b. Pt states he had "some tests" scheduled for later in July 2014.  . High cholesterol   . Hyperlipidemia 02/01/2012  . Hypertension   . Hypertensive urgency 07/06/2016  . 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 Orthostatic hypotension    a. Tx with florinef.  . Peripheral neuropathy   . Polyp, colonic    Colonoscopy 01/2012 "benign" polyp  . Prostate cancer ([place])   . Sleep apnea    "had mask; couldn't sleep in it" (09/20/2012)  . Smoker   . SOB (shortness of breath) 07/06/2016  . Stage III chronic kidney disease ([name])    a. Stage 3 (DM with complications ->CKD, peripheral neuropathy).  . Stroke ([place])    x 2  . Symptomatic anemia 04/03/2015  . Tinea pedis 05/24/2012  . Tobacco use 02/01/2012  . Type 2 diabetes, uncontrolled, with neuropathy ([place]) 02/01/2012  . Type II diabetes mellitus ([place])    a. Dx 1994, uncontrolled.      Social History   Socioeconomic History  . Marital status: Married    Spouse name: Not on file  . Number of children: 2  . Years of education: Not on file  . Highest education level: Not on file  Social Needs  . Financial resource strain: Not on file  . Food insecurity - worry: Not on file  . Food insecurity - inability: Not on file  . Transportation needs - medical: Not on file  . Transportation needs - non-medical: Not on file  Occupational History  . Occupation: taken out of work due to back  Tobacco Use  . Smoking status: Current Every Day Smoker    Packs/day: 0.50    Years: 30.00    Pack years: 15.00    Types: Cigarettes    Start date: 04/15/1968  . Smokeless tobacco: Never Used  . Tobacco comment: start back a couple of months ago  Substance and Sexual Activity  . Alcohol use: No    Alcohol/week: 0.0 oz    Comment: 09/20/2012 "Used to; stopped ~ 2009; never had problem w/it"  . Drug  use: No  . Sexual activity: No  Other Topics Concern  . Not on file  Social History Narrative   Regular exercise: rides horses   Caffeine use: occasionally    Past Surgical History:  Procedure Laterality Date  . ENTEROSCOPY N/A 03/01/2013   Procedure: ENTEROSCOPY;  Surgeon: Inda Castle, MD;  Location: [place];  Service: Endoscopy;  Laterality: N/A;  . ENTEROSCOPY N/A 06/26/2016   Procedure: ENTEROSCOPY;  Surgeon: Milus Banister, MD;  Location: [place];  Service: Endoscopy;  Laterality: N/A;  this is a push enteroscopy  . ENTEROSCOPY N/A 10/23/2016   Procedure: ENTEROSCOPY;  Surgeon: Gatha Mayer, MD;  Location: [facility] ENDOSCOPY;  Service: Endoscopy;  Laterality: N/A;  . GIVENS CAPSULE STUDY N/A 04/05/2015   Procedure: GIVENS CAPSULE STUDY;  Surgeon: Gatha Mayer, MD;  Location: [place];  Service: Endoscopy;  Laterality: N/A;  . INGUINAL HERNIA REPAIR Right 2011  .  LEFT HEART CATH AND CORONARY ANGIOGRAPHY N/A 12/23/2016   Procedure: LEFT HEART CATH AND CORONARY ANGIOGRAPHY;  Surgeon: Leonie Man, MD;  Location: [place] [facility];  Service: Cardiovascular;  Laterality: N/A;  . LEFT HEART CATHETERIZATION WITH CORONARY ANGIOGRAM N/A 09/21/2012   Procedure: LEFT HEART CATHETERIZATION WITH CORONARY ANGIOGRAM;  Surgeon: Peter M Martinique, MD;  Location: [facility] CATH LAB;  Service: Cardiovascular;  Laterality: N/A;  . LUMBAR DISC SURGERY  1980's  . LYMPHADENECTOMY Bilateral 01/19/2013   Procedure: LYMPHADENECTOMY;  Surgeon: Bernestine Amass, MD;  [facility];  Service: Urology;  Laterality: Bilateral;  . ROBOT ASSISTED LAPAROSCOPIC RADICAL PROSTATECTOMY N/A 01/19/2013   Procedure: ROBOTIC ASSISTED LAPAROSCOPIC RADICAL PROSTATECTOMY;  Surgeon: Bernestine Amass, MD;  [facility];  Service: Urology;  Laterality: N/A;  . SHOULDER OPEN ROTATOR CUFF REPAIR Left 1980's    Family History  Problem Relation Age of Onset  . Stroke Father        Died at 56  . Diabetes Mother   .  Heart attack Brother        Died at 61  . Diabetes Sister   . Stomach cancer Brother   . Heart attack Sister     No Known Allergies  Current Outpatient Medications on File Prior to Visit  Medication Sig Dispense Refill  . ARTIFICIAL TEAR SOLUTION OP Instill 1-2 drops into both eyes two to three times a day as needed for dry eyes    . atorvastatin (LIPITOR) 80 MG tablet Take 80 mg by mouth at bedtime.    . dronabinol (MARINOL) 2.5 MG capsule TK ONE C PO BID  0  . multivitamin (RENA-VIT) TABS tablet Take 1 tablet by mouth every Monday, Wednesday, and Friday.     . ranitidine (ZANTAC) 150 MG capsule Take 1 capsule (150 mg total) by mouth 2 (two) times daily. 180 capsule 1  . venlafaxine (EFFEXOR) 37.5 MG tablet Take 37.5 mg by mouth every morning.     No current facility-administered medications on file prior to visit.     BP 108/70   Pulse (!) 107   Resp 14   SpO2 100%       Objective:   Physical Exam  General Mental Status- Alert. General Appearance- Not in acute distress. Wife thinks he is in pain. He shifts around as if somehting hurts but he won't tell us.  Skin General: Color- Normal Color. Moisture- Normal Moisture.  Neck Carotid Arteries- Normal color. Moisture- Normal Moisture. No carotid bruits. No JVD.  Chest and Lung Exam Auscultation: Breath Sounds:-Normal.  Cardiovascular Auscultation:Rythm- Regular. Murmurs & Other Heart Sounds:Auscultation of the heart reveals- No Murmurs.  Abdomen Inspection:-Inspeection Normal. Palpation/Percussion:Note:No mass. Palpation and Percussion of the abdomen reveal- Non Tender, Non Distended + BS, no rebound or guarding.  . . Neurologic Cranial Nerve exam:- CN III-XII intact(No nystagmus), symmetric smile. Strength:- 5/5 equal and symmetric strength both upper and lower extremities.      Assessment & Plan:  With recent change in mental status, not talking much and agitation during the interview, I do recommend we  go ahead and repeat CT stat today.  It will be a hold and call study.  In light of his many medical problems and change in mental status/behavior, I explained that the emergency department evaluation would be preferred choice.  However, Raysean  is refusing this and he states that he will not allow any labs to be done today.  So we need to watch him closely.  If abnormal CT of head study then he might need to be seen in the emergency department.  If his behavior worsens or other symptoms occur then would recommend the emergency department as well.  Please call tomorrow and give me an update on how he is doing.  If provided no changes in CT he will keep the appointment with neurosurgeon early next week.  I also will go ahead and put in referral to psychiatry.  However those can take some time.  With follow-up in 7-10 days or as needed.  Elis Sauber, Percell Miller, PA-C okay

## 2017-03-19 NOTE — ED Provider Notes (Signed)
[name] EMERGENCY DEPARTMENT Provider Note   CSN: [id number] Arrival date & time: 03/19/17  1735  LEVEL 5 CAVEAT - ALTERED MENTAL STATUS   History   Chief Complaint Chief Complaint  Patient presents with  . Cerebrovascular Accident    HPI [name] is a 72 y.o. male.  HPI  72 year old male sent in by his PCP for a possible stroke.  The patient recently was admitted Christmas day for a subdural hematoma.  The wife provides the history as the patient is not oriented.  The patient was doing okay when he went home but now has had progressive trouble walking and seems off balance over the last few days.  Since yesterday he has been altered more than normal and seems to be sleeping more and confused.  He went to dialysis yesterday and had a full dialysis session.  Went to PCP today and a CT head was performed as an outpatient that showed a possible small stroke versus edema in his cerebellum.  The patient denies any acute complaints and does tell me that he is here for a possible stroke.  Past Medical History:  Diagnosis Date  . 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 Anemia, iron deficiency 03/24/2011   a. Recurrent GI bleed, tx with periodic iron infusions.  . 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 AVM (arteriovenous malformation) of duodenum, acquired    egds in 01/2012, 04/2011  . BPH (benign prostatic hyperplasia)   . CAD (coronary artery disease)    a. Cath 09/2012: moderate borderline CAD in mid LAD/small diagonal branch, mild RCA stenosis, to be managed medically   . Chronic lower back pain   . Diabetic peripheral neuropathy ([place]) 2014   foot pain.  . Essential hypertension 02/01/2012  . Fatty liver    on CT of 11/2010  . GERD (gastroesophageal reflux disease)   . GI bleed    a. Recurrent GI bleed, tx with IV iron. b. Per heme notes - likely AVMs 04/2012 (tx with cauterization several months ago).  . Hematuria    a. Urology note scan from 07/2012:  cystoscopy without evidence for bladder lesion, only lateral hypertrophy of posterior urethra, bladder impression from BPH. b. Pt states he had "some tests" scheduled for later in July 2014.  . High cholesterol   . Hyperlipidemia 02/01/2012  . Hypertension   . Hypertensive urgency 07/06/2016  . 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 Orthostatic hypotension    a. Tx with florinef.  . Peripheral neuropathy   . Polyp, colonic    Colonoscopy 01/2012 "benign" polyp  . Prostate cancer ([place])   . Sleep apnea    "had mask; couldn't sleep in it" (09/20/2012)  . Smoker   . SOB (shortness of breath) 07/06/2016  . Stage III chronic kidney disease ([name])    a. Stage 3 (DM with complications ->CKD, peripheral neuropathy).  . Stroke ([place])    x 2  . Symptomatic anemia 04/03/2015  . Tinea pedis 05/24/2012  . Tobacco use 02/01/2012  . Type 2 diabetes, uncontrolled, with neuropathy ([name]) 02/01/2012  . Type II diabetes mellitus ([name])    a. Dx 1994, uncontrolled.     Patient Active Problem List   Diagnosis Date Noted  . Acute encephalopathy 03/10/2017  . Subdural hematoma ([name]) 03/10/2017  . Dilated cardiomyopathy ([name]) 01/07/2017  . Acute respiratory failure with hypoxia ([name]) 12/22/2016  . Acute on chronic combined systolic and diastolic CHF (congestive heart failure) ([name]) 12/22/2016  .  HLD (hyperlipidemia) 12/22/2016  . Elevated troponin I level   . Acute blood loss anemia   . Fall 10/22/2016  . GIB (gastrointestinal bleeding) 10/21/2016  . Abnormal echocardiogram 08/06/2016  . Flash pulmonary edema ([place]) 07/06/2016  . SOB (shortness of breath) 07/06/2016  . Hypertensive urgency 07/06/2016  . Anemia in chronic kidney disease, on chronic dialysis ([name])   . Anemia due to chronic kidney disease   . Diabetic retinopathy ([place]) 01/16/2016  . Dysphagia 12/21/2015  . Environmental allergies 12/04/2015  . Allergic rhinitis 10/28/2015  . ESRD (end stage renal disease) ([name]) 10/28/2015    . Stroke ([place]) 04/06/2015  . AVM (arteriovenous malformation) of small bowel, acquired with hemorrhage ([name]) 04/06/2015  . Malnutrition of moderate degree 04/05/2015  . Acute renal failure superimposed on stage 4 chronic kidney disease ([place]) 04/04/2015  . Symptomatic anemia 04/03/2015  . Benign paroxysmal positional vertigo 09/26/2014  . Weakness of right lower extremity 02/28/2014  . Metatarsalgia of both feet 09/13/2013  . Equinus deformity of foot, acquired 09/13/2013  . Diabetes mellitus type 2, controlled, with complications ([name]) 12/45/8099  . GERD (gastroesophageal reflux disease) 03/24/2013  . GI bleed 02/28/2013  . Cerebral infarction ([name]) 11/17/2012  . Dizziness and giddiness 10/31/2012  . Prostate cancer ([place]) 10/27/2012  . Diabetic neuropathy ([name]) 10/04/2012  . CAD (coronary artery disease) 10/04/2012  . Hoarseness 06/26/2012  . Hematuria 05/24/2012  . Tinea pedis 05/24/2012  . Back pain 02/23/2012  . Erectile dysfunction 02/03/2012  . Type 2 diabetes, uncontrolled, with neuropathy ([name]) 02/01/2012  . Essential hypertension 02/01/2012  . Tobacco use 02/01/2012  . Hyperlipidemia 02/01/2012  . Anemia of renal disease 05/14/2011    Past Surgical History:  Procedure Laterality Date  . ENTEROSCOPY N/A 03/01/2013   Procedure: ENTEROSCOPY;  Surgeon: Inda Castle, MD;  Location: [name];  Service: Endoscopy;  Laterality: N/A;  . ENTEROSCOPY N/A 06/26/2016   Procedure: ENTEROSCOPY;  Surgeon: Milus Banister, MD;  Location: [name];  Service: Endoscopy;  Laterality: N/A;  this is a push enteroscopy  . ENTEROSCOPY N/A 10/23/2016   Procedure: ENTEROSCOPY;  Surgeon: Gatha Mayer, MD;  Location: [name];  Service: Endoscopy;  Laterality: N/A;  . GIVENS CAPSULE STUDY N/A 04/05/2015   Procedure: GIVENS CAPSULE STUDY;  Surgeon: Gatha Mayer, MD;  Location: [name];  Service: Endoscopy;  Laterality: N/A;  . INGUINAL HERNIA REPAIR Right 2011  . LEFT HEART  CATH AND CORONARY ANGIOGRAPHY N/A 12/23/2016   Procedure: LEFT HEART CATH AND CORONARY ANGIOGRAPHY;  Surgeon: Leonie Man, MD;  Location: [name];  Service: Cardiovascular;  Laterality: N/A;  . LEFT HEART CATHETERIZATION WITH CORONARY ANGIOGRAM N/A 09/21/2012   Procedure: LEFT HEART CATHETERIZATION WITH CORONARY ANGIOGRAM;  Surgeon: Peter M Martinique, MD;  [facility];  Service: Cardiovascular;  Laterality: N/A;  . LUMBAR DISC SURGERY  1980's  . LYMPHADENECTOMY Bilateral 01/19/2013   Procedure: LYMPHADENECTOMY;  Surgeon: Bernestine Amass, MD;  Location: [facility];  Service: Urology;  Laterality: Bilateral;  . ROBOT ASSISTED LAPAROSCOPIC RADICAL PROSTATECTOMY N/A 01/19/2013   Procedure: ROBOTIC ASSISTED LAPAROSCOPIC RADICAL PROSTATECTOMY;  Surgeon: Bernestine Amass, MD;  Location: [facility];  Service: Urology;  Laterality: N/A;  . SHOULDER OPEN ROTATOR CUFF REPAIR Left 1980's       Home Medications    Prior to Admission medications   Medication Sig Start Date End Date Taking? Authorizing Provider  ARTIFICIAL TEAR SOLUTION OP Instill 1-2 drops into both eyes two to three  times a day as needed for dry eyes    [provider]  atorvastatin (LIPITOR) 80 MG tablet Take 80 mg by mouth at bedtime.    [provider]  dronabinol (MARINOL) 2.5 MG capsule TK ONE C PO BID 03/06/17   [provider]  multivitamin (RENA-VIT) TABS tablet Take 1 tablet by mouth every Monday, Wednesday, and Friday.     [provider]  ranitidine (ZANTAC) 150 MG capsule Take 1 capsule (150 mg total) by mouth 2 (two) times daily. 01/06/17   Saguier, Percell Miller, PA-C  venlafaxine (EFFEXOR) 37.5 MG tablet Take 37.5 mg by mouth every morning.    [provider]    Family History Family History  Problem Relation Age of Onset  . Stroke Father        Died at 50  . Diabetes Mother   . Heart attack Brother        Died at 26  . Diabetes Sister   . Stomach cancer Brother   .  Heart attack Sister     Social History Social History   Tobacco Use  . Smoking status: Current Every Day Smoker    Packs/day: 0.50    Years: 30.00    Pack years: 15.00    Types: Cigarettes    Start date: 04/15/1968  . Smokeless tobacco: Never Used  . Tobacco comment: start back a couple of months ago  Substance Use Topics  . Alcohol use: No    Alcohol/week: 0.0 oz    Comment: 09/20/2012 "Used to; stopped ~ 2009; never had problem w/it"  . Drug use: No     Allergies   Patient has no known allergies.   Review of Systems Review of Systems  Unable to perform ROS: Mental status change     Physical Exam Updated Vital Signs BP 119/73   Pulse 79   Temp 98.7 F (37.1 C) (Oral)   Resp 18   Ht 5\' 7"  (1.702 m)   Wt 54.4 kg (120 lb)   SpO2 98%   BMI 18.79 kg/m   Physical Exam  Constitutional: He appears well-developed and well-nourished. No distress.  HENT:  Head: Normocephalic and atraumatic.  Right Ear: External ear normal.  Left Ear: External ear normal.  Nose: Nose normal.  Eyes: EOM are normal. Pupils are equal, round, and reactive to light. Right eye exhibits no discharge. Left eye exhibits no discharge. Right eye exhibits no nystagmus. Left eye exhibits no nystagmus.  Neck: Neck supple.  Cardiovascular: Normal rate, regular rhythm and normal heart sounds.  Pulmonary/Chest: Effort normal and breath sounds normal.  Abdominal: Soft. There is no tenderness.  Musculoskeletal: He exhibits no edema.  Neurological: He is alert. He is disoriented.  CN 3-12 grossly intact. 5/5 strength in all 4 extremities. Grossly normal sensation. Normal finger to nose.   Skin: Skin is warm and dry. He is not diaphoretic.  Nursing note and vitals reviewed.    ED Treatments / Results  Labs (all labs ordered are listed, but only abnormal results are displayed) Labs Reviewed  CBC - Abnormal; Notable for the following components:      Result Value   RBC 4.04 (*)    Hemoglobin 12.5  (*)    HCT 37.9 (*)    RDW 18.0 (*)    All other components within normal limits  CBG MONITORING, ED - Abnormal; Notable for the following components:   Glucose-Capillary 125 (*)    All other components within normal limits  I-STAT CHEM 8, ED - Abnormal; Notable for the following components:   Chloride 99 (*)    BUN 33 (*)    Creatinine, Ser 9.60 (*)    Glucose, Bld 195 (*)    All other components within normal limits  ETHANOL  PROTIME-INR  APTT  DIFFERENTIAL  COMPREHENSIVE METABOLIC PANEL  RAPID URINE DRUG SCREEN, HOSP PERFORMED  URINALYSIS, ROUTINE W REFLEX MICROSCOPIC  I-STAT TROPONIN, ED    EKG  EKG Interpretation None     ED ECG REPORT   Date: 03/19/2017  Rate: 103  Rhythm: sinus tachycardia  QRS Axis: normal  Intervals: normal  ST/T Wave abnormalities: normal  Conduction Disutrbances:none  Narrative Interpretation:   Old EKG Reviewed: unchanged  I have personally reviewed the EKG tracing and agree with the computerized printout as noted.   Radiology Ct Head Wo Contrast  Result Date: 03/19/2017 CLINICAL DATA:  72 year old male for follow-up of subdural hematoma. Unchanged altered mental status and encephalopathy. EXAM: CT HEAD WITHOUT CONTRAST TECHNIQUE: Contiguous axial images were obtained from the base of the skull through the vertex without intravenous contrast. COMPARISON:  03/12/2017 MR, 03/10/2017 CT and prior studies FINDINGS: Brain: The right posterior fossa subdural collection is no longer visualized. A new 1 cm hypodensity within the left cerebellum (image 7 -8) likely represents new ischemic changes/infarct or edema. Atrophy, chronic small-vessel white matter ischemic changes and remote bilateral basal ganglia and thalamic infarcts again noted. No new hemorrhage or extra-axial collection noted. Vascular: Atherosclerotic calcifications noted Skull: No acute abnormality Sinuses/Orbits: No acute abnormality Other: None IMPRESSION: 1. New 1 cm left cerebellar  hypodensity which may represent ischemic changes/infarct/edema. No hemorrhage. 2. Interval resolution of right posterior fossa subdural collection. No evidence of new hemorrhage or extra-axial collection. 3. Atrophy, chronic small-vessel white matter ischemic changes and remote basal ganglia/thalamic infarcts. Electronically Signed   By: Margarette Canada M.D.   On: 03/19/2017 15:32    Procedures Procedures (including critical care time)  Medications Ordered in ED Medications  LORazepam (ATIVAN) injection 1 mg (not administered)  LORazepam (ATIVAN) injection 0.5 mg (0.5 mg Intravenous Given 03/19/17 1917)     Initial Impression / Assessment and Plan / ED Course  I have reviewed the triage vital signs and the nursing notes.  Pertinent labs & imaging results that were available during my care of the patient were reviewed by me and considered in my medical decision making (see chart for details).     Patient is altered from baseline and has a concerning CT that could be edema versus infarct.  I discussed with his wife and he has had significant balance issues over the last several days.  He is well outside the code stroke window.  Discussed with neurology, the area is very small on my read as well and he does not need emergent treatment such as 3% normal saline or ICU admission.  When patient found out he is going to be admitted he started becoming agitated, which has happened in the past according to chart review and wife.  Because he is altered and does not appear capable of making medical decisions, he was given Ativan and then low-dose IV Haldol to help control his agitation.  This has helped.  He will be admitted to the hospitalist, Dr. Hal Hope to admit.  Final Clinical Impressions(s) / ED Diagnoses   Final diagnoses:  Altered mental status, unspecified altered mental status type    ED Discharge Orders    None  Sherwood Gambler, MD 03/19/17 [phone]

## 2017-03-19 NOTE — ED Notes (Signed)
Patient denies pain and is resting comfortably.  

## 2017-03-20 ENCOUNTER — Ambulatory Visit (HOSPITAL_COMMUNITY): Payer: Medicare Other

## 2017-03-20 ENCOUNTER — Inpatient Hospital Stay (HOSPITAL_COMMUNITY): Payer: Medicare Other

## 2017-03-20 DIAGNOSIS — R4182 Altered mental status, unspecified: Secondary | ICD-10-CM | POA: Diagnosis present

## 2017-03-20 DIAGNOSIS — I639 Cerebral infarction, unspecified: Secondary | ICD-10-CM

## 2017-03-20 LAB — COMPREHENSIVE METABOLIC PANEL
ALBUMIN: 3.7 g/dL (ref 3.5–5.0)
ALK PHOS: 89 U/L (ref 38–126)
ALT: 8 U/L — AB (ref 17–63)
AST: 7 U/L — AB (ref 15–41)
Anion gap: 13 (ref 5–15)
BILIRUBIN TOTAL: 0.8 mg/dL (ref 0.3–1.2)
BUN: 36 mg/dL — AB (ref 6–20)
CALCIUM: 9.2 mg/dL (ref 8.9–10.3)
CO2: 26 mmol/L (ref 22–32)
CREATININE: 10.59 mg/dL — AB (ref 0.61–1.24)
Chloride: 99 mmol/L — ABNORMAL LOW (ref 101–111)
GFR calc Af Amer: 5 mL/min — ABNORMAL LOW (ref >60)
GFR, EST NON AFRICAN AMERICAN: 4 mL/min — AB (ref >60)
GLUCOSE: 102 mg/dL — AB (ref 65–99)
POTASSIUM: 4 mmol/L (ref 3.5–5.1)
Sodium: 138 mmol/L (ref 135–145)
TOTAL PROTEIN: 7.4 g/dL (ref 6.5–8.1)

## 2017-03-20 LAB — CBC
HCT: 37.3 % — ABNORMAL LOW (ref 39.0–52.0)
Hemoglobin: 11.9 g/dL — ABNORMAL LOW (ref 13.0–17.0)
MCH: 29.5 pg (ref 26.0–34.0)
MCHC: 31.9 g/dL (ref 30.0–36.0)
MCV: 92.6 fL (ref 78.0–100.0)
PLATELETS: 219 10*3/uL (ref 150–400)
RBC: 4.03 MIL/uL — ABNORMAL LOW (ref 4.22–5.81)
RDW: 17.7 % — AB (ref 11.5–15.5)
WBC: 5.3 10*3/uL (ref 4.0–10.5)

## 2017-03-20 LAB — CBG MONITORING, ED
GLUCOSE-CAPILLARY: 101 mg/dL — AB (ref 65–99)
GLUCOSE-CAPILLARY: 90 mg/dL (ref 65–99)
Glucose-Capillary: 81 mg/dL (ref 65–99)
Glucose-Capillary: 91 mg/dL (ref 65–99)

## 2017-03-20 LAB — LIPID PANEL
CHOL/HDL RATIO: 5.6 ratio
Cholesterol: 208 mg/dL — ABNORMAL HIGH (ref 0–200)
HDL: 37 mg/dL — AB (ref >40)
LDL CALC: 135 mg/dL — AB (ref 0–99)
TRIGLYCERIDES: 181 mg/dL — AB (ref <150)
VLDL: 36 mg/dL (ref 0–40)

## 2017-03-20 LAB — HEMOGLOBIN A1C
HEMOGLOBIN A1C: 5.3 % (ref 4.8–5.6)
Mean Plasma Glucose: 105.41 mg/dL

## 2017-03-20 LAB — VALPROIC ACID LEVEL

## 2017-03-20 LAB — PHOSPHORUS: Phosphorus: 1.6 mg/dL — ABNORMAL LOW (ref 2.5–4.6)

## 2017-03-20 LAB — AMMONIA: Ammonia: 23 umol/L (ref 9–35)

## 2017-03-20 MED ORDER — SODIUM CHLORIDE 0.9 % IV SOLN
INTRAVENOUS | Status: DC
  Administered 2017-03-20: 07:00:00 via INTRAVENOUS

## 2017-03-20 MED ORDER — HALOPERIDOL LACTATE 5 MG/ML IJ SOLN
1.0000 mg | Freq: Once | INTRAMUSCULAR | Status: AC
  Administered 2017-03-20: 1 mg via INTRAVENOUS
  Filled 2017-03-20: qty 1

## 2017-03-20 NOTE — ED Notes (Signed)
Patient denies pain and is resting comfortably.  

## 2017-03-20 NOTE — ED Notes (Signed)
Paged admitting to RN Nicki Reaper per his request

## 2017-03-20 NOTE — ED Notes (Signed)
Attempted report and the secretary stated the pt has not been assigned a nurse and the charge RN is stuck in a contact room.

## 2017-03-20 NOTE — Progress Notes (Signed)
PROGRESS NOTE    [name]  VWU:[id number] DOB: 11-24-1945 DOA: 03/19/2017 PCP: Mackie Pai, PA-C      Brief Narrative:  [name] is a 72 yo M with ESRD on MWF HD, HTN, DM and recent traumatic subdural hematoma, not evacuated who presents with incidental finding on head CT.      Assessment & Plan:  Principal Problem:   Acute CVA (cerebrovascular accident) ([place]) Active Problems:   Anemia of renal disease   ESRD (end stage renal disease) ([name])   Acute encephalopathy   DM (diabetes mellitus), type 2 with renal complications ([name])   Stroke This was incidental.  The patient was admitted from 11/18 to 11/27 to [facility] with traumatic subdural hematoma, not requiring evacuation, discharged to SNF.  Yesterday he had a CT head as routine follow up for his SDH and was found to have new small cerebellar infarct and so he was sent to the ER. -Admit to telemetry -Neuro checks, NIHSS per protocol -Daily aspirin 325 mg -Permissive hypertension for now -Lipids shows LDL >130, hemoglobin A1c well controlled -Carotid doppler ordered -Echocardiogram ordered -NPO and SLP consult -PT/OT consultation -Consult to Neurology, appreciate recommendations    Encephalopathy Delirium He was admitted from 12/25 to 12/27 here at [facility] for agitation, and MRI brain, B12, ammonia, TSH, HIV, RPR were all normal.  He was treated empirically for seizures with Depakote per Neurology and his symptoms improved.   Currently, this appears to be some delirium from stroke superimposed on dementia.  There is no evidence of infection and no electrolyte abnormalities.  Seizures have been postulated. -Consult to Neurology -Depakote IV -EEG if able   ESRD -Consult to Nephrology for routine HD  Chronic systolic CHF CAD Hypertension -Continue statin when able to take PO -Continue full dose aspirin  Diabetes This is diet controlled per notes. -SSI q4hrs  Anemia of renal disease Stable around baseline  ~12.  Possible seizures -Continue depakote IV  Recent subdural hematoma  Other medications -Continue venlafaxine and ranitidine when able to take PO      Core measures: -VTE prophylaxis ordered at time of admission -Aspirin ordered at admission -Atrial fibrillation: not known -tPA not given because of outside stroke window -Dysphagia screen ordered in ER -Lipids ordered -PT eval ordered -Non-smoker     DVT prophylaxis: SCDs Code Status: FULL Family Communication: None present Disposition Plan: PT eval, stroke work up, likely home in 1-2 days.   Consultants:   Neurology  Nephrology  Procedures:   MRI brain  MRA head  Carotid US  Echocardiogram       Subjective: Sleepy, lying in bed.  Has not had Haldol since last night.  Has not had depakote today.  No reported fever, cough, vomiting.  No reported seizure activity here.  Has been agitated and combative.  Objective: Vitals:   03/20/17 0930 03/20/17 1015 03/20/17 1030 03/20/17 1130  BP: (!) 147/87 (!) 149/87 (!) 147/81 (!) 147/91  Pulse:  (!) 102    Resp: 17  17 16   Temp:      TempSrc:      SpO2:  100%    Weight:      Height:       No intake or output data in the 24 hours ending 03/20/17 1320 Filed Weights   03/19/17 1745  Weight: 54.4 kg (120 lb)    Examination: General appearance: Elderly adult male, awake but sedated and in no acute distress.   HEENT: Anicteric, conjunctiva pink, lids and  lashes normal. No nasal deformity, discharge, epistaxis.  Lips moist.  Edentulous.   Skin: Warm and dry.   No suspicious rashes or lesions. Cardiac: RRR, nl S1-S2.  Capillary refill is brisk.  JVP normal.  No LE edema.   Respiratory: Normal respiratory rate and rhythm.  CTAB without rales or wheezes. Abdomen: Abdomen soft.  No TTP. No ascites, distension, hepatosplenomegaly.   MSK: No deformities or effusions. Neuro: Awake, follows commands intermittently.  Oriented to "hospital", "after Christmas"  but nothing more.  Mostly does not answer questions.  EOM appear intact, he tracks me.    Psych: Attention dminished. Affect flat.  Judgment and insight appear impaired.    Data Reviewed: I have personally reviewed following labs and imaging studies:  CBC: Recent Labs  Lab 03/19/17 1759 03/19/17 1825 03/20/17 0246  WBC 4.1  --  5.3  NEUTROABS 2.3  --   --   HGB 12.5* 13.9 11.9*  HCT 37.9* 41.0 37.3*  MCV 93.8  --  92.6  PLT 197  --  161   Basic Metabolic Panel: Recent Labs  Lab 03/19/17 1759 03/19/17 1825 03/20/17 0246  NA 141 141 138  K 4.9 4.7 4.0  CL 100* 99* 99*  CO2 28  --  26  GLUCOSE 199* 195* 102*  BUN 31* 33* 36*  CREATININE 9.91* 9.60* 10.59*  CALCIUM 9.4  --  9.2   GFR: Estimated Creatinine Clearance: 5 mL/min (A) (by C-G formula based on SCr of 10.59 mg/dL (H)). Liver Function Tests: Recent Labs  Lab 03/19/17 1759 03/20/17 0246  AST 12* 7*  ALT 10* 8*  ALKPHOS 90 89  BILITOT 0.6 0.8  PROT 7.8 7.4  ALBUMIN 3.7 3.7   No results for input(s): LIPASE, AMYLASE in the last 168 hours. Recent Labs  Lab 03/20/17 0246  AMMONIA 23   Coagulation Profile: Recent Labs  Lab 03/19/17 1759  INR 1.09   Cardiac Enzymes: No results for input(s): CKTOTAL, CKMB, CKMBINDEX, TROPONINI in the last 168 hours. BNP (last 3 results) Recent Labs    07/09/16 1438 07/31/16 1104 08/19/16 1155  PROBNP 1,044.0* 343.0* 349.0*   HbA1C: Recent Labs    03/20/17 0246  HGBA1C 5.3   CBG: Recent Labs  Lab 03/19/17 1747 03/20/17 0227 03/20/17 0747 03/20/17 1235  GLUCAP 125* 81 91 101*   Lipid Profile: Recent Labs    03/20/17 0246  CHOL 208*  HDL 37*  LDLCALC 135*  TRIG 181*  CHOLHDL 5.6   Thyroid Function Tests: No results for input(s): TSH, T4TOTAL, FREET4, T3FREE, THYROIDAB in the last 72 hours. Anemia Panel: No results for input(s): VITAMINB12, FOLATE, FERRITIN, TIBC, IRON, RETICCTPCT in the last 72 hours. Urine analysis:    Component Value  Date/Time   COLORURINE YELLOW 04/01/2016 [place] 04/01/2016 0939   LABSPEC >=1.030 (A) 04/01/2016 0939   PHURINE 5.5 04/01/2016 0939   GLUCOSEU NEGATIVE 04/01/2016 0939   HGBUR MODERATE (A) 04/01/2016 0939   BILIRUBINUR SMALL (A) 04/01/2016 0939   BILIRUBINUR small 06/21/2012 1041   KETONESUR TRACE (A) 04/01/2016 0939   PROTEINUR >300 (A) 12/25/2015 1028   UROBILINOGEN 0.2 04/01/2016 0939   NITRITE NEGATIVE 04/01/2016 0939   LEUKOCYTESUR NEGATIVE 04/01/2016 0939   Sepsis Labs: @LABRCNTIP (procalcitonin:4,lacticacidven:4)  ) Recent Results (from the past 240 hour(s))  MRSA PCR Screening     Status: None   Collection Time: 03/10/17  8:46 PM  Result Value Ref Range Status   MRSA by PCR NEGATIVE NEGATIVE Final  Comment:        The GeneXpert MRSA Assay (FDA approved for NASAL specimens only), is one component of a comprehensive MRSA colonization surveillance program. It is not intended to diagnose MRSA infection nor to guide or monitor treatment for MRSA infections.          Radiology Studies: Ct Head Wo Contrast  Result Date: 03/19/2017 CLINICAL DATA:  72 year old male for follow-up of subdural hematoma. Unchanged altered mental status and encephalopathy. EXAM: CT HEAD WITHOUT CONTRAST TECHNIQUE: Contiguous axial images were obtained from the base of the skull through the vertex without intravenous contrast. COMPARISON:  03/12/2017 MR, 03/10/2017 CT and prior studies FINDINGS: Brain: The right posterior fossa subdural collection is no longer visualized. A new 1 cm hypodensity within the left cerebellum (image 7 -8) likely represents new ischemic changes/infarct or edema. Atrophy, chronic small-vessel white matter ischemic changes and remote bilateral basal ganglia and thalamic infarcts again noted. No new hemorrhage or extra-axial collection noted. Vascular: Atherosclerotic calcifications noted Skull: No acute abnormality Sinuses/Orbits: No acute abnormality  Other: None IMPRESSION: 1. New 1 cm left cerebellar hypodensity which may represent ischemic changes/infarct/edema. No hemorrhage. 2. Interval resolution of right posterior fossa subdural collection. No evidence of new hemorrhage or extra-axial collection. 3. Atrophy, chronic small-vessel white matter ischemic changes and remote basal ganglia/thalamic infarcts. Electronically Signed   By: Margarette Canada M.D.   On: 03/19/2017 15:32   [name] Contrast  Result Date: 03/20/2017 CLINICAL DATA:  Initial evaluation for acute altered mental status, possible stroke. EXAM: MRI HEAD WITHOUT CONTRAST MRA HEAD WITHOUT CONTRAST TECHNIQUE: Multiplanar, multiecho pulse sequences of the brain and surrounding structures were obtained without intravenous contrast. Angiographic images of the head were obtained using MRA technique without contrast. COMPARISON:  Prior CT from 03/19/2017 as well as recent MRI from 03/12/2017. FINDINGS: MRI HEAD FINDINGS Brain: Advanced parenchymal volume loss. Chronic microvascular ischemic disease with scatter remote lacunar infarcts involving the bilateral basal ganglia and thalami and as well as the pons, stable from previous. 1 cm acute ischemic nonhemorrhagic infarct present within the mid left cerebellar hemisphere, corresponding to previously noted hypodensity seen on prior CT (series 3, image 9). No associated mass effect. For no other evidence for acute or subacute ischemia. Gray-white matter differentiation otherwise maintained. No acute intracranial hemorrhage. Multiple remote chronic micro hemorrhages again noted, consistent with chronic hypertensive encephalopathy. No mass lesion, midline shift or mass effect. Ventricular prominence related to global parenchymal volume loss without hydrocephalus. Previously noted subdural collection at the right posterior fossa is nearly resolved now measuring up to 2 mm in maximal thickness without significant mass effect. No other new extra-axial  collection. Major dural sinuses are grossly patent. Vascular: Major intracranial vascular flow voids maintained at the skullbase. Skull and upper cervical spine: Craniocervical junction normal. Bone marrow signal intensity within normal limits. No scalp soft tissue abnormality. Sinuses/Orbits: Globes orbital soft tissues within normal limits. Paranasal sinuses are largely clear. No significant mastoid effusion. Inner ear structures normal. Other: None. MRA HEAD FINDINGS ANTERIOR CIRCULATION: Distal cervical segments of the internal carotid arteries are patent with antegrade flow. Petrous, cavernous, and supraclinoid segments patent bilaterally without flow-limiting stenosis. Mild atheromatous irregularity within the carotid siphons. A1 segments, anterior communicating artery common anterior cerebral arteries widely patent. Atheromatous irregularity throughout the M1 segments and distal MCA branches without high-grade stenosis, slightly worse on the right. POSTERIOR CIRCULATION: Visualized vertebral arteries patent to the vertebrobasilar junction without stenosis. Partially visualized posterior inferior cerebral artery is patent. Basilar  patent to its distal aspect without stenosis. Superior cerebral arteries and posterior cerebral artery is patent bilaterally without flow-limiting stenosis. Distal small vessel atheromatous irregularity within the PCA branches bilaterally. No aneurysm. IMPRESSION: MRI HEAD IMPRESSION: 1. 1 cm acute ischemic nonhemorrhagic infarct positioned at the central left cerebellar hemisphere. 2. Near interval resolution of previously seen right posterior fossa subdural collection, now measuring up to 2 mm in thickness without mass effect. 3. A advanced parenchymal volume loss with chronic micro vessel ischemic disease in scatter remote lacunar infarcts as above. 4. Multiple chronic micro hemorrhages throughout the brain, consistent with underlying chronic hypertension. MRA HEAD IMPRESSION:  Intracranial atherosclerosis as above without large or proximal arterial branch occlusion. No high-grade or correctable stenosis within the intracranial circulation. Electronically Signed   By: Jeannine Boga M.D.   On: 03/20/2017 03:07   Mr Jodene Nam Head Wo Contrast  Result Date: 03/20/2017 CLINICAL DATA:  Initial evaluation for acute altered mental status, possible stroke. EXAM: MRI HEAD WITHOUT CONTRAST MRA HEAD WITHOUT CONTRAST TECHNIQUE: Multiplanar, multiecho pulse sequences of the brain and surrounding structures were obtained without intravenous contrast. Angiographic images of the head were obtained using MRA technique without contrast. COMPARISON:  Prior CT from 03/19/2017 as well as recent MRI from 03/12/2017. FINDINGS: MRI HEAD FINDINGS Brain: Advanced parenchymal volume loss. Chronic microvascular ischemic disease with scatter remote lacunar infarcts involving the bilateral basal ganglia and thalami and as well as the pons, stable from previous. 1 cm acute ischemic nonhemorrhagic infarct present within the mid left cerebellar hemisphere, corresponding to previously noted hypodensity seen on prior CT (series 3, image 9). No associated mass effect. For no other evidence for acute or subacute ischemia. Gray-white matter differentiation otherwise maintained. No acute intracranial hemorrhage. Multiple remote chronic micro hemorrhages again noted, consistent with chronic hypertensive encephalopathy. No mass lesion, midline shift or mass effect. Ventricular prominence related to global parenchymal volume loss without hydrocephalus. Previously noted subdural collection at the right posterior fossa is nearly resolved now measuring up to 2 mm in maximal thickness without significant mass effect. No other new extra-axial collection. Major dural sinuses are grossly patent. Vascular: Major intracranial vascular flow voids maintained at the skullbase. Skull and upper cervical spine: Craniocervical junction  normal. Bone marrow signal intensity within normal limits. No scalp soft tissue abnormality. Sinuses/Orbits: Globes orbital soft tissues within normal limits. Paranasal sinuses are largely clear. No significant mastoid effusion. Inner ear structures normal. Other: None. MRA HEAD FINDINGS ANTERIOR CIRCULATION: Distal cervical segments of the internal carotid arteries are patent with antegrade flow. Petrous, cavernous, and supraclinoid segments patent bilaterally without flow-limiting stenosis. Mild atheromatous irregularity within the carotid siphons. A1 segments, anterior communicating artery common anterior cerebral arteries widely patent. Atheromatous irregularity throughout the M1 segments and distal MCA branches without high-grade stenosis, slightly worse on the right. POSTERIOR CIRCULATION: Visualized vertebral arteries patent to the vertebrobasilar junction without stenosis. Partially visualized posterior inferior cerebral artery is patent. Basilar patent to its distal aspect without stenosis. Superior cerebral arteries and posterior cerebral artery is patent bilaterally without flow-limiting stenosis. Distal small vessel atheromatous irregularity within the PCA branches bilaterally. No aneurysm. IMPRESSION: MRI HEAD IMPRESSION: 1. 1 cm acute ischemic nonhemorrhagic infarct positioned at the central left cerebellar hemisphere. 2. Near interval resolution of previously seen right posterior fossa subdural collection, now measuring up to 2 mm in thickness without mass effect. 3. A advanced parenchymal volume loss with chronic micro vessel ischemic disease in scatter remote lacunar infarcts as above. 4. Multiple chronic micro hemorrhages  throughout the brain, consistent with underlying chronic hypertension. MRA HEAD IMPRESSION: Intracranial atherosclerosis as above without large or proximal arterial branch occlusion. No high-grade or correctable stenosis within the intracranial circulation. Electronically Signed    By: Jeannine Boga M.D.   On: 03/20/2017 03:07        Scheduled Meds: .  stroke: mapping our early stages of recovery book   Does not apply Once  . aspirin  300 mg Rectal Daily   Or  . aspirin  325 mg Oral Daily  . insulin aspart  0-9 Units Subcutaneous Q4H   Continuous Infusions: . sodium chloride 125 mL/hr at 03/20/17 0645     LOS: 1 day    Time spent: 45 minutes    Edwin Dada, MD [facility] 03/20/2017, 1:20 PM     Pager [phone] --- please page though AMION:  [email or web address] If 7PM-7AM, please contact night-coverage

## 2017-03-20 NOTE — Progress Notes (Addendum)
Pt refusing EEG. I asked why (due to being unsure of mental state) he stated because he doesn't want it.  Pt was swatting at me and began sitting up. Will contact Dr. Lorraine Lax

## 2017-03-20 NOTE — ED Notes (Signed)
Pt CBG was 90, notified Scott(RN)

## 2017-03-20 NOTE — ED Notes (Addendum)
Pt returned from MRI °

## 2017-03-20 NOTE — ED Notes (Signed)
Glucose 102. Per sliding scale, no insulin due.

## 2017-03-20 NOTE — ED Notes (Signed)
Attending paged about missing nephrology consult for dialysis patient.

## 2017-03-20 NOTE — Consult Note (Signed)
Reason for Consult: ESRD Referring Physician:  Dr. Norina Buzzard  Chief Complaint: AMS  Assessment/Plan: 1. Acute CVA - no tPA per hospitalist bec outside window. I called the spouse Guerry Minors at [phone] and the dysarthria is not new.  Per review of notes pt has been speaking much less @ home.  2. ESRD - no acute indication for HD but will place on HD tonight to get back on MWF regimen. He dialyzes at [facility] [phone] -> no response; will need to call in AM and get orders. - Will only attempt 2L UF as tolerated to avoid any hypotensive episodes in the setting of a CVA. - No heparin with acute CVA. 3. 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 4. Anemia - Stable 5. HTN - avoid dropping BP >30% with the acute CVA. Agree with holding meds at this time and monitoring. 6. DM    HPI: [name] is an 72 y.o. male ESRD MWF @ [facility] in [facility] with last HD Wed. He had a traumatic subdural hematoma 01/2017 +  recent encephalopathy with "less talking." When I spoke with him he was able to nod yes or no and it seemed that he was having a difficult time articulating his thoughts but understood the questions. Repeat CT scan showed a new CVA which is why he was sent to the ED. He denies dyspnea, CP, nausea, dysgeusia, weakness; he also denies f/c/n/v. At points by history and after his wife called back he has been combative; the dysarthria is also chronic.  ROS Pertinent items are noted in HPI.  Chemistry and CBC: Creatinine  Date/Time Value Ref Range Status  11/15/2015 09:17 AM 5.3 (HH) 0.7 - 1.3 mg/dL Final  10/11/2015 10:16 AM 5.9 (HH) 0.7 - 1.3 mg/dL Final  09/20/2015 09:53 AM 8.8 Repeated and Verified (HH) 0.7 - 1.3 mg/dL Final  08/21/2015 09:27 AM 6.4 (HH) 0.7 - 1.3 mg/dL Final  07/24/2015 10:09 AM 6.0 Repeated and Verified (HH) 0.7 - 1.3 mg/dL Final  06/18/2015 09:53 AM 6.1 (HH) 0.7 - 1.3 mg/dL Final   04/17/2015 08:52 AM 4.8 (HH) 0.7 - 1.3 mg/dL Final  03/15/2015 10:40 AM 3.7 (HH) 0.7 - 1.3 mg/dL Final  12/29/2014 12:51 PM 3.2 (HH) 0.7 - 1.3 mg/dL Final  11/29/2014 01:44 PM 3.0 (HH) 0.7 - 1.3 mg/dL Final  10/06/2014 11:02 AM 2.5 (H) 0.7 - 1.3 mg/dL Final   Creat  Date/Time Value Ref Range Status  10/16/2016 12:57 PM 8.4 (HH) 0.6 - 1.2 mg/dl Final  12/13/2015 09:01 AM 5.4 (HH) 0.6 - 1.2 mg/dl Final  07/27/2014 08:21 AM 2.1 (H) 0.6 - 1.2 mg/dl Final  06/29/2014 08:11 AM 1.9 (H) 0.6 - 1.2 mg/dl Final  12/29/2013 08:10 AM 1.5 (H) 0.6 - 1.2 mg/dl Final  10/27/2013 07:56 AM 1.6 (H) 0.6 - 1.2 mg/dl Final  08/24/2013 08:09 AM 1.3 (H) 0.6 - 1.2 mg/dl Final  07/27/2013 08:18 AM 1.2 0.6 - 1.2 mg/dl Final  05/30/2013 08:50 AM 1.2 0.6 - 1.2 mg/dl Final  04/15/2013 08:10 AM 1.3 (H) 0.6 - 1.2 mg/dl Final  03/18/2013 09:19 AM 1.0 0.6 - 1.2 mg/dl Final  02/28/2013 01:24 PM 1.2 0.6 - 1.2 mg/dl Final  02/21/2013 09:09 AM 1.10 0.50 - 1.35 mg/dL Final  01/04/2013 10:41 AM 1.26 0.50 - 1.35 mg/dL Final  12/09/2012 09:40 AM 1.6 (H) 0.6 - 1.2 mg/dl Final  12/03/2012 12:07 PM 1.70 (H) 0.50 - 1.35  mg/dL Final  11/16/2012 02:43 PM 1.22 0.50 - 1.35 mg/dL Final  09/27/2012 09:52 AM 1.1 0.6 - 1.2 mg/dl Final  05/24/2012 09:42 AM 1.26 0.50 - 1.35 mg/dL Final  01/29/2012 03:31 PM 1.03 0.50 - 1.35 mg/dL Final  01/01/2011 01:58 PM 0.8 0.6 - 1.2 mg/dl Final   Creatinine, Ser  Date/Time Value Ref Range Status  03/20/2017 02:46 AM 10.59 (H) 0.61 - 1.24 mg/dL Final  03/19/2017 06:25 PM 9.60 (H) 0.61 - 1.24 mg/dL Final  03/19/2017 05:59 PM 9.91 (H) 0.61 - 1.24 mg/dL Final  03/11/2017 06:31 AM 10.90 (H) 0.61 - 1.24 mg/dL Final  03/10/2017 03:19 PM 10.30 (H) 0.61 - 1.24 mg/dL Final  03/10/2017 03:06 PM 9.89 (H) 0.61 - 1.24 mg/dL Final  01/06/2017 09:27 AM 9.82 (HH) 0.40 - 1.50 mg/dL Final  12/24/2016 11:45 PM 10.22 (H) 0.61 - 1.24 mg/dL Final  12/24/2016 03:47 AM 9.11 (H) 0.61 - 1.24 mg/dL Final  12/23/2016  03:36 AM 7.20 (H) 0.61 - 1.24 mg/dL Final  12/22/2016 12:52 AM 8.83 (H) 0.61 - 1.24 mg/dL Final  11/06/2016 01:03 PM 6.56 (H) 0.76 - 1.27 mg/dL Final  10/28/2016 11:53 AM 7.11 (HH) 0.40 - 1.50 mg/dL Final  10/23/2016 02:30 AM 9.45 (H) 0.61 - 1.24 mg/dL Final  10/22/2016 02:08 AM 8.65 (H) 0.61 - 1.24 mg/dL Final  10/21/2016 08:13 PM 8.27 (H) 0.61 - 1.24 mg/dL Final  08/19/2016 11:55 AM 8.33 (HH) 0.40 - 1.50 mg/dL Final  07/31/2016 11:04 AM 9.96 (HH) 0.40 - 1.50 mg/dL Final  07/26/2016 02:26 AM 6.59 (H) 0.61 - 1.24 mg/dL Final  07/25/2016 11:25 AM 9.17 (H) 0.61 - 1.24 mg/dL Final  07/25/2016 05:30 AM 9.00 (H) 0.61 - 1.24 mg/dL Final  07/07/2016 11:54 AM 5.20 (H) 0.61 - 1.24 mg/dL Final  07/05/2016 10:56 PM 6.41 (H) 0.61 - 1.24 mg/dL Final  06/25/2016 08:42 PM 5.13 (H) 0.61 - 1.24 mg/dL Final    Comment:    DELTA CHECK NOTED  06/25/2016 04:33 AM 3.39 (H) 0.61 - 1.24 mg/dL Final    Comment:    DELTA CHECK NOTED  06/24/2016 10:07 AM 5.73 (H) 0.61 - 1.24 mg/dL Final  06/23/2016 04:54 PM 4.60 (H) 0.61 - 1.24 mg/dL Final  06/23/2016 04:38 PM 4.38 (H) 0.61 - 1.24 mg/dL Final  06/19/2016 10:35 AM 5.36 (HH) 0.40 - 1.50 mg/dL Final  06/14/2016 03:58 AM 5.55 (H) 0.61 - 1.24 mg/dL Final  06/13/2016 12:22 PM 3.70 (H) 0.61 - 1.24 mg/dL Final  06/13/2016 12:01 PM 3.88 (H) 0.61 - 1.24 mg/dL Final  04/01/2016 09:39 AM 8.74 (HH) 0.40 - 1.50 mg/dL Final  12/25/2015 10:42 AM 6.23 (H) 0.61 - 1.24 mg/dL Final  10/08/2015 09:42 AM 9.53 (HH) 0.40 - 1.50 mg/dL Final  09/20/2015 04:30 PM 8.23 (H) 0.61 - 1.24 mg/dL Final  07/18/2015 11:19 AM 5.61 (HH) 0.40 - 1.50 mg/dL Final  04/07/2015 04:15 AM 3.54 (H) 0.61 - 1.24 mg/dL Final  04/06/2015 05:35 AM 3.29 (H) 0.61 - 1.24 mg/dL Final  04/04/2015 05:51 AM 3.52 (H) 0.61 - 1.24 mg/dL Final  04/03/2015 10:40 AM 3.76 (H) 0.61 - 1.24 mg/dL Final  01/01/2015 09:30 AM 3.12 (H) 0.40 - 1.50 mg/dL Final  11/08/2014 08:01 AM 2.37 (H) 0.70 - 1.25 mg/dL Final  09/25/2014  09:08 AM 2.38 (H) 0.40 - 1.50 mg/dL Final  09/15/2014 08:28 AM 2.20 (H) 0.50 - 1.35 mg/dL Final  09/05/2014 11:09 AM 2.13 (H) 0.50 - 1.35 mg/dL Final  06/23/2014 12:18 PM 1.82 (H) 0.40 - 1.50 mg/dL  Final  03/30/2014 08:32 AM 1.59 (H) 0.50 - 1.35 mg/dL Final  02/03/2014 09:35 AM 1.5 0.4 - 1.5 mg/dL Final  03/08/2013 11:35 AM 1.14 0.50 - 1.35 mg/dL Final  03/03/2013 01:12 AM 1.20 0.50 - 1.35 mg/dL Final  03/01/2013 07:24 AM 1.03 0.50 - 1.35 mg/dL Final  02/28/2013 05:55 PM 1.10 0.50 - 1.35 mg/dL Final   Recent Labs  Lab 03/19/17 1759 03/19/17 1825 03/20/17 0246  NA 141 141 138  K 4.9 4.7 4.0  CL 100* 99* 99*  CO2 28  --  26  GLUCOSE 199* 195* 102*  BUN 31* 33* 36*  CREATININE 9.91* 9.60* 10.59*  CALCIUM 9.4  --  9.2   Recent Labs  Lab 03/19/17 1759 03/19/17 1825 03/20/17 0246  WBC 4.1  --  5.3  NEUTROABS 2.3  --   --   HGB 12.5* 13.9 11.9*  HCT 37.9* 41.0 37.3*  MCV 93.8  --  92.6  PLT 197  --  219   Liver Function Tests: Recent Labs  Lab 03/19/17 1759 03/20/17 0246  AST 12* 7*  ALT 10* 8*  ALKPHOS 90 89  BILITOT 0.6 0.8  PROT 7.8 7.4  ALBUMIN 3.7 3.7   No results for input(s): LIPASE, AMYLASE in the last 168 hours. Recent Labs  Lab 03/20/17 0246  AMMONIA 23   Cardiac Enzymes: No results for input(s): CKTOTAL, CKMB, CKMBINDEX, TROPONINI in the last 168 hours. Iron Studies: No results for input(s): IRON, TIBC, TRANSFERRIN, FERRITIN in the last 72 hours. PT/INR: @LABRCNTIP (inr:5)  Xrays/Other Studies: ) Results for orders placed or performed during the hospital encounter of 03/19/17 (from the past 48 hour(s))  CBG monitoring, ED     Status: Abnormal   Collection Time: 03/19/17  5:47 PM  Result Value Ref Range   Glucose-Capillary 125 (H) 65 - 99 mg/dL  Ethanol     Status: None   Collection Time: 03/19/17  5:59 PM  Result Value Ref Range   Alcohol, Ethyl (B) <10 <10 mg/dL    Comment:        LOWEST DETECTABLE LIMIT FOR SERUM ALCOHOL IS 10 mg/dL FOR  MEDICAL PURPOSES ONLY   Protime-INR     Status: None   Collection Time: 03/19/17  5:59 PM  Result Value Ref Range   Prothrombin Time 14.0 11.4 - 15.2 seconds   INR 1.09   APTT     Status: None   Collection Time: 03/19/17  5:59 PM  Result Value Ref Range   aPTT 35 24 - 36 seconds  CBC     Status: Abnormal   Collection Time: 03/19/17  5:59 PM  Result Value Ref Range   WBC 4.1 4.0 - 10.5 K/uL   RBC 4.04 (L) 4.22 - 5.81 MIL/uL   Hemoglobin 12.5 (L) 13.0 - 17.0 g/dL   HCT 37.9 (L) 39.0 - 52.0 %   MCV 93.8 78.0 - 100.0 fL   MCH 30.9 26.0 - 34.0 pg   MCHC 33.0 30.0 - 36.0 g/dL   RDW 18.0 (H) 11.5 - 15.5 %   Platelets 197 150 - 400 K/uL  Differential     Status: None   Collection Time: 03/19/17  5:59 PM  Result Value Ref Range   Neutrophils Relative % 55 %   Neutro Abs 2.3 1.7 - 7.7 K/uL   Lymphocytes Relative 30 %   Lymphs Abs 1.2 0.7 - 4.0 K/uL   Monocytes Relative 12 %   Monocytes Absolute 0.5 0.1 - 1.0 K/uL  Eosinophils Relative 2 %   Eosinophils Absolute 0.1 0.0 - 0.7 K/uL   Basophils Relative 1 %   Basophils Absolute 0.0 0.0 - 0.1 K/uL  Comprehensive metabolic panel     Status: Abnormal   Collection Time: 03/19/17  5:59 PM  Result Value Ref Range   Sodium 141 135 - 145 mmol/L   Potassium 4.9 3.5 - 5.1 mmol/L   Chloride 100 (L) 101 - 111 mmol/L   CO2 28 22 - 32 mmol/L   Glucose, Bld 199 (H) 65 - 99 mg/dL   BUN 31 (H) 6 - 20 mg/dL   Creatinine, Ser 9.91 (H) 0.61 - 1.24 mg/dL   Calcium 9.4 8.9 - 10.3 mg/dL   Total Protein 7.8 6.5 - 8.1 g/dL   Albumin 3.7 3.5 - 5.0 g/dL   AST 12 (L) 15 - 41 U/L   ALT 10 (L) 17 - 63 U/L   Alkaline Phosphatase 90 38 - 126 U/L   Total Bilirubin 0.6 0.3 - 1.2 mg/dL   GFR calc non Af Amer 5 (L) >60 mL/min   GFR calc Af Amer 5 (L) >60 mL/min    Comment: (NOTE) The eGFR has been calculated using the CKD EPI equation. This calculation has not been validated in all clinical situations. eGFR's persistently <60 mL/min signify possible  Chronic Kidney Disease.    Anion gap 13 5 - 15  I-stat troponin, ED     Status: None   Collection Time: 03/19/17  6:23 PM  Result Value Ref Range   Troponin i, poc 0.01 0.00 - 0.08 ng/mL   Comment 3            Comment: Due to the release kinetics of cTnI, a negative result within the first hours of the onset of symptoms does not rule out myocardial infarction with certainty. If myocardial infarction is still suspected, repeat the test at appropriate intervals.   I-Stat Chem 8, ED     Status: Abnormal   Collection Time: 03/19/17  6:25 PM  Result Value Ref Range   Sodium 141 135 - 145 mmol/L   Potassium 4.7 3.5 - 5.1 mmol/L   Chloride 99 (L) 101 - 111 mmol/L   BUN 33 (H) 6 - 20 mg/dL   Creatinine, Ser 9.60 (H) 0.61 - 1.24 mg/dL   Glucose, Bld 195 (H) 65 - 99 mg/dL   Calcium, Ion 1.15 1.15 - 1.40 mmol/L   TCO2 29 22 - 32 mmol/L   Hemoglobin 13.9 13.0 - 17.0 g/dL   HCT 41.0 39.0 - 52.0 %  CBG monitoring, ED     Status: None   Collection Time: 03/20/17  2:27 AM  Result Value Ref Range   Glucose-Capillary 81 65 - 99 mg/dL  Hemoglobin A1c     Status: None   Collection Time: 03/20/17  2:46 AM  Result Value Ref Range   Hgb A1c MFr Bld 5.3 4.8 - 5.6 %    Comment: (NOTE) Pre diabetes:          5.7%-6.4% Diabetes:              >6.4% Glycemic control for   <7.0% adults with diabetes    Mean Plasma Glucose 105.41 mg/dL  Comprehensive metabolic panel     Status: Abnormal   Collection Time: 03/20/17  2:46 AM  Result Value Ref Range   Sodium 138 135 - 145 mmol/L   Potassium 4.0 3.5 - 5.1 mmol/L   Chloride 99 (L) 101 - 111 mmol/L  CO2 26 22 - 32 mmol/L   Glucose, Bld 102 (H) 65 - 99 mg/dL   BUN 36 (H) 6 - 20 mg/dL   Creatinine, Ser 10.59 (H) 0.61 - 1.24 mg/dL   Calcium 9.2 8.9 - 10.3 mg/dL   Total Protein 7.4 6.5 - 8.1 g/dL   Albumin 3.7 3.5 - 5.0 g/dL   AST 7 (L) 15 - 41 U/L   ALT 8 (L) 17 - 63 U/L   Alkaline Phosphatase 89 38 - 126 U/L   Total Bilirubin 0.8 0.3 - 1.2  mg/dL   GFR calc non Af Amer 4 (L) >60 mL/min   GFR calc Af Amer 5 (L) >60 mL/min    Comment: (NOTE) The eGFR has been calculated using the CKD EPI equation. This calculation has not been validated in all clinical situations. eGFR's persistently <60 mL/min signify possible Chronic Kidney Disease.    Anion gap 13 5 - 15  CBC     Status: Abnormal   Collection Time: 03/20/17  2:46 AM  Result Value Ref Range   WBC 5.3 4.0 - 10.5 K/uL   RBC 4.03 (L) 4.22 - 5.81 MIL/uL   Hemoglobin 11.9 (L) 13.0 - 17.0 g/dL   HCT 37.3 (L) 39.0 - 52.0 %   MCV 92.6 78.0 - 100.0 fL   MCH 29.5 26.0 - 34.0 pg   MCHC 31.9 30.0 - 36.0 g/dL   RDW 17.7 (H) 11.5 - 15.5 %   Platelets 219 150 - 400 K/uL  Ammonia     Status: None   Collection Time: 03/20/17  2:46 AM  Result Value Ref Range   Ammonia 23 9 - 35 umol/L  Valproic acid level     Status: Abnormal   Collection Time: 03/20/17  2:46 AM  Result Value Ref Range   Valproic Acid Lvl <10 (L) 50.0 - 100.0 ug/mL    Comment: RESULTS CONFIRMED BY MANUAL DILUTION  Lipid panel     Status: Abnormal   Collection Time: 03/20/17  2:46 AM  Result Value Ref Range   Cholesterol 208 (H) 0 - 200 mg/dL   Triglycerides 181 (H) <150 mg/dL   HDL 37 (L) >40 mg/dL   Total CHOL/HDL Ratio 5.6 RATIO   VLDL 36 0 - 40 mg/dL   LDL Cholesterol 135 (H) 0 - 99 mg/dL    Comment:        Total Cholesterol/HDL:CHD Risk Coronary Heart Disease Risk Table                     Men   Women  1/2 Average Risk   3.4   3.3  Average Risk       5.0   4.4  2 X Average Risk   9.6   7.1  3 X Average Risk  23.4   11.0        Use the calculated Patient Ratio above and the CHD Risk Table to determine the patient's CHD Risk.        ATP III CLASSIFICATION (LDL):  <100     mg/dL   Optimal  100-129  mg/dL   Near or Above                    Optimal  130-159  mg/dL   Borderline  160-189  mg/dL   High  >190     mg/dL   Very High   CBG monitoring, ED     Status: None   Collection  Time: 03/20/17   7:47 AM  Result Value Ref Range   Glucose-Capillary 91 65 - 99 mg/dL  CBG monitoring, ED     Status: Abnormal   Collection Time: 03/20/17 12:35 PM  Result Value Ref Range   Glucose-Capillary 101 (H) 65 - 99 mg/dL   Comment 1 Notify RN    Comment 2 Document in Chart   CBG monitoring, ED     Status: None   Collection Time: 03/20/17  5:14 PM  Result Value Ref Range   Glucose-Capillary 90 65 - 99 mg/dL   Comment 1 Notify RN    Comment 2 Document in Chart    Ct Head Wo Contrast  Result Date: 03/19/2017 CLINICAL DATA:  72 year old male for follow-up of subdural hematoma. Unchanged altered mental status and encephalopathy. EXAM: CT HEAD WITHOUT CONTRAST TECHNIQUE: Contiguous axial images were obtained from the base of the skull through the vertex without intravenous contrast. COMPARISON:  03/12/2017 MR, 03/10/2017 CT and prior studies FINDINGS: Brain: The right posterior fossa subdural collection is no longer visualized. A new 1 cm hypodensity within the left cerebellum (image 7 -8) likely represents new ischemic changes/infarct or edema. Atrophy, chronic small-vessel white matter ischemic changes and remote bilateral basal ganglia and thalamic infarcts again noted. No new hemorrhage or extra-axial collection noted. Vascular: Atherosclerotic calcifications noted Skull: No acute abnormality Sinuses/Orbits: No acute abnormality Other: None IMPRESSION: 1. New 1 cm left cerebellar hypodensity which may represent ischemic changes/infarct/edema. No hemorrhage. 2. Interval resolution of right posterior fossa subdural collection. No evidence of new hemorrhage or extra-axial collection. 3. Atrophy, chronic small-vessel white matter ischemic changes and remote basal ganglia/thalamic infarcts. Electronically Signed   By: Margarette Canada M.D.   On: 03/19/2017 15:32   [name] Contrast  Result Date: 03/20/2017 CLINICAL DATA:  Initial evaluation for acute altered mental status, possible stroke. EXAM: MRI HEAD WITHOUT  CONTRAST MRA HEAD WITHOUT CONTRAST TECHNIQUE: Multiplanar, multiecho pulse sequences of the brain and surrounding structures were obtained without intravenous contrast. Angiographic images of the head were obtained using MRA technique without contrast. COMPARISON:  Prior CT from 03/19/2017 as well as recent MRI from 03/12/2017. FINDINGS: MRI HEAD FINDINGS Brain: Advanced parenchymal volume loss. Chronic microvascular ischemic disease with scatter remote lacunar infarcts involving the bilateral basal ganglia and thalami and as well as the pons, stable from previous. 1 cm acute ischemic nonhemorrhagic infarct present within the mid left cerebellar hemisphere, corresponding to previously noted hypodensity seen on prior CT (series 3, image 9). No associated mass effect. For no other evidence for acute or subacute ischemia. Gray-white matter differentiation otherwise maintained. No acute intracranial hemorrhage. Multiple remote chronic micro hemorrhages again noted, consistent with chronic hypertensive encephalopathy. No mass lesion, midline shift or mass effect. Ventricular prominence related to global parenchymal volume loss without hydrocephalus. Previously noted subdural collection at the right posterior fossa is nearly resolved now measuring up to 2 mm in maximal thickness without significant mass effect. No other new extra-axial collection. Major dural sinuses are grossly patent. Vascular: Major intracranial vascular flow voids maintained at the skullbase. Skull and upper cervical spine: Craniocervical junction normal. Bone marrow signal intensity within normal limits. No scalp soft tissue abnormality. Sinuses/Orbits: Globes orbital soft tissues within normal limits. Paranasal sinuses are largely clear. No significant mastoid effusion. Inner ear structures normal. Other: None. MRA HEAD FINDINGS ANTERIOR CIRCULATION: Distal cervical segments of the internal carotid arteries are patent with antegrade flow. Petrous,  cavernous, and supraclinoid segments patent bilaterally without flow-limiting stenosis.  Mild atheromatous irregularity within the carotid siphons. A1 segments, anterior communicating artery common anterior cerebral arteries widely patent. Atheromatous irregularity throughout the M1 segments and distal MCA branches without high-grade stenosis, slightly worse on the right. POSTERIOR CIRCULATION: Visualized vertebral arteries patent to the vertebrobasilar junction without stenosis. Partially visualized posterior inferior cerebral artery is patent. Basilar patent to its distal aspect without stenosis. Superior cerebral arteries and posterior cerebral artery is patent bilaterally without flow-limiting stenosis. Distal small vessel atheromatous irregularity within the PCA branches bilaterally. No aneurysm. IMPRESSION: MRI HEAD IMPRESSION: 1. 1 cm acute ischemic nonhemorrhagic infarct positioned at the central left cerebellar hemisphere. 2. Near interval resolution of previously seen right posterior fossa subdural collection, now measuring up to 2 mm in thickness without mass effect. 3. A advanced parenchymal volume loss with chronic micro vessel ischemic disease in scatter remote lacunar infarcts as above. 4. Multiple chronic micro hemorrhages throughout the brain, consistent with underlying chronic hypertension. MRA HEAD IMPRESSION: Intracranial atherosclerosis as above without large or proximal arterial branch occlusion. No high-grade or correctable stenosis within the intracranial circulation. Electronically Signed   By: Jeannine Boga M.D.   On: 03/20/2017 03:07   Mr Jodene Nam Head Wo Contrast  Result Date: 03/20/2017 CLINICAL DATA:  Initial evaluation for acute altered mental status, possible stroke. EXAM: MRI HEAD WITHOUT CONTRAST MRA HEAD WITHOUT CONTRAST TECHNIQUE: Multiplanar, multiecho pulse sequences of the brain and surrounding structures were obtained without intravenous contrast. Angiographic images of  the head were obtained using MRA technique without contrast. COMPARISON:  Prior CT from 03/19/2017 as well as recent MRI from 03/12/2017. FINDINGS: MRI HEAD FINDINGS Brain: Advanced parenchymal volume loss. Chronic microvascular ischemic disease with scatter remote lacunar infarcts involving the bilateral basal ganglia and thalami and as well as the pons, stable from previous. 1 cm acute ischemic nonhemorrhagic infarct present within the mid left cerebellar hemisphere, corresponding to previously noted hypodensity seen on prior CT (series 3, image 9). No associated mass effect. For no other evidence for acute or subacute ischemia. Gray-white matter differentiation otherwise maintained. No acute intracranial hemorrhage. Multiple remote chronic micro hemorrhages again noted, consistent with chronic hypertensive encephalopathy. No mass lesion, midline shift or mass effect. Ventricular prominence related to global parenchymal volume loss without hydrocephalus. Previously noted subdural collection at the right posterior fossa is nearly resolved now measuring up to 2 mm in maximal thickness without significant mass effect. No other new extra-axial collection. Major dural sinuses are grossly patent. Vascular: Major intracranial vascular flow voids maintained at the skullbase. Skull and upper cervical spine: Craniocervical junction normal. Bone marrow signal intensity within normal limits. No scalp soft tissue abnormality. Sinuses/Orbits: Globes orbital soft tissues within normal limits. Paranasal sinuses are largely clear. No significant mastoid effusion. Inner ear structures normal. Other: None. MRA HEAD FINDINGS ANTERIOR CIRCULATION: Distal cervical segments of the internal carotid arteries are patent with antegrade flow. Petrous, cavernous, and supraclinoid segments patent bilaterally without flow-limiting stenosis. Mild atheromatous irregularity within the carotid siphons. A1 segments, anterior communicating artery  common anterior cerebral arteries widely patent. Atheromatous irregularity throughout the M1 segments and distal MCA branches without high-grade stenosis, slightly worse on the right. POSTERIOR CIRCULATION: Visualized vertebral arteries patent to the vertebrobasilar junction without stenosis. Partially visualized posterior inferior cerebral artery is patent. Basilar patent to its distal aspect without stenosis. Superior cerebral arteries and posterior cerebral artery is patent bilaterally without flow-limiting stenosis. Distal small vessel atheromatous irregularity within the PCA branches bilaterally. No aneurysm. IMPRESSION: MRI HEAD IMPRESSION: 1. 1 cm acute  ischemic nonhemorrhagic infarct positioned at the central left cerebellar hemisphere. 2. Near interval resolution of previously seen right posterior fossa subdural collection, now measuring up to 2 mm in thickness without mass effect. 3. A advanced parenchymal volume loss with chronic micro vessel ischemic disease in scatter remote lacunar infarcts as above. 4. Multiple chronic micro hemorrhages throughout the brain, consistent with underlying chronic hypertension. MRA HEAD IMPRESSION: Intracranial atherosclerosis as above without large or proximal arterial branch occlusion. No high-grade or correctable stenosis within the intracranial circulation. Electronically Signed   By: Jeannine Boga M.D.   On: 03/20/2017 03:07    PMH:   Past Medical History:  Diagnosis Date  . 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 Anemia, iron deficiency 03/24/2011   a. Recurrent GI bleed, tx with periodic iron infusions.  . 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 AVM (arteriovenous malformation) of duodenum, acquired    egds in 01/2012, 04/2011  . BPH (benign prostatic hyperplasia)   . CAD (coronary artery disease)    a. Cath 09/2012: moderate borderline CAD in mid LAD/small diagonal branch, mild RCA stenosis, to be managed medically   . Chronic lower back pain    . Diabetic peripheral neuropathy ([place]) 2014   foot pain.  . Essential hypertension 02/01/2012  . Fatty liver    on CT of 11/2010  . GERD (gastroesophageal reflux disease)   . GI bleed    a. Recurrent GI bleed, tx with IV iron. b. Per heme notes - likely AVMs 04/2012 (tx with cauterization several months ago).  . Hematuria    a. Urology note scan from 07/2012: cystoscopy without evidence for bladder lesion, only lateral hypertrophy of posterior urethra, bladder impression from BPH. b. Pt states he had "some tests" scheduled for later in July 2014.  . High cholesterol   . Hyperlipidemia 02/01/2012  . Hypertension   . Hypertensive urgency 07/06/2016  . 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 Orthostatic hypotension    a. Tx with florinef.  . Peripheral neuropathy   . Polyp, colonic    Colonoscopy 01/2012 "benign" polyp  . Prostate cancer ([place])   . Sleep apnea    "had mask; couldn't sleep in it" (09/20/2012)  . Smoker   . SOB (shortness of breath) 07/06/2016  . Stage III chronic kidney disease ([name])    a. Stage 3 (DM with complications ->CKD, peripheral neuropathy).  . Stroke ([place])    x 2  . Symptomatic anemia 04/03/2015  . Tinea pedis 05/24/2012  . Tobacco use 02/01/2012  . Type 2 diabetes, uncontrolled, with neuropathy ([place]) 02/01/2012  . Type II diabetes mellitus ([place])    a. Dx 1994, uncontrolled.     PSH:   Past Surgical History:  Procedure Laterality Date  . ENTEROSCOPY N/A 03/01/2013   Procedure: ENTEROSCOPY;  Surgeon: Inda Castle, MD;  Location: [facility];  Service: Endoscopy;  Laterality: N/A;  . ENTEROSCOPY N/A 06/26/2016   Procedure: ENTEROSCOPY;  Surgeon: Milus Banister, MD;  Location: [place];  Service: Endoscopy;  Laterality: N/A;  this is a push enteroscopy  . ENTEROSCOPY N/A 10/23/2016   Procedure: ENTEROSCOPY;  Surgeon: Gatha Mayer, MD;  Location: [facility] ENDOSCOPY;  Service: Endoscopy;  Laterality: N/A;  . GIVENS CAPSULE STUDY N/A 04/05/2015    Procedure: GIVENS CAPSULE STUDY;  Surgeon: Gatha Mayer, MD;  Location: [place];  Service: Endoscopy;  Laterality: N/A;  . INGUINAL HERNIA REPAIR Right 2011  . LEFT HEART CATH AND CORONARY ANGIOGRAPHY N/A 12/23/2016  Procedure: LEFT HEART CATH AND CORONARY ANGIOGRAPHY;  Surgeon: Leonie Man, MD;  Location: [place] [facility];  Service: Cardiovascular;  Laterality: N/A;  . LEFT HEART CATHETERIZATION WITH CORONARY ANGIOGRAM N/A 09/21/2012   Procedure: LEFT HEART CATHETERIZATION WITH CORONARY ANGIOGRAM;  Surgeon: Peter M Martinique, MD;  Location: [facility] CATH LAB;  Service: Cardiovascular;  Laterality: N/A;  . LUMBAR DISC SURGERY  1980's  . LYMPHADENECTOMY Bilateral 01/19/2013   Procedure: LYMPHADENECTOMY;  Surgeon: Bernestine Amass, MD;  Location: [facility];  Service: Urology;  Laterality: Bilateral;  . ROBOT ASSISTED LAPAROSCOPIC RADICAL PROSTATECTOMY N/A 01/19/2013   Procedure: ROBOTIC ASSISTED LAPAROSCOPIC RADICAL PROSTATECTOMY;  Surgeon: Bernestine Amass, MD;  Location: [facility];  Service: Urology;  Laterality: N/A;  . SHOULDER OPEN ROTATOR CUFF REPAIR Left 1980's    Allergies: No Known Allergies  Medications:   Prior to Admission medications   Medication Sig Start Date End Date Taking? Authorizing Provider  ARTIFICIAL TEAR SOLUTION OP Instill 1-2 drops into both eyes two to three times a day as needed for dry eyes   Yes [provider]  atorvastatin (LIPITOR) 80 MG tablet Take 80 mg by mouth at bedtime.   Yes [provider]  carvedilol (COREG) 3.125 MG tablet Take 3.125 mg by mouth 2 (two) times daily with a meal.   Yes [provider]  dronabinol (MARINOL) 2.5 MG capsule Take 2.5 mg by mouth once a day 03/06/17  Yes [provider]  multivitamin (RENA-VIT) TABS tablet Take 1 tablet by mouth every Monday, Wednesday, and Friday.    Yes [provider]  ranitidine (ZANTAC) 150 MG capsule Take 1 capsule (150 mg total) by mouth 2 (two) times daily.  01/06/17  Yes Saguier, Percell Miller, PA-C  venlafaxine (EFFEXOR) 37.5 MG tablet Take 37.5 mg by mouth every morning.   Yes [provider]    Discontinued Meds:  There are no discontinued medications.  Social History:  reports that he has been smoking cigarettes.  He started smoking about 48 years ago. He has a 15.00 pack-year smoking history. he has never used smokeless tobacco. He reports that he does not drink alcohol or use drugs.  Family History:   Family History  Problem Relation Age of Onset  . Stroke Father        Died at 72  . Diabetes Mother   . Heart attack Brother        Died at 32  . Diabetes Sister   . Stomach cancer Brother   . Heart attack Sister     Blood pressure (!) 150/87, pulse 89, temperature 98.7 F (37.1 C), temperature source Oral, resp. rate 16, height 5' 7"  (1.702 m), weight 54.4 kg (120 lb), SpO2 100 %. General appearance: alert and cooperative Head: Normocephalic, without obvious abnormality, atraumatic Eyes: negative Neck: no adenopathy, no carotid bruit, supple, symmetrical, trachea midline and thyroid not enlarged, symmetric, no tenderness/mass/nodules Back: symmetric, no curvature. ROM normal. No CVA tenderness. Resp: poor air movement Chest wall: no tenderness Cardio: regular rate and rhythm, S1, S2 normal, no murmur, click, rub or gallop GI: soft, non-tender; bowel sounds normal; no masses,  no organomegaly Male genitalia: normal Extremities: edema no edema Pulses: 2+ and symmetric Skin: Skin color, texture, turgor normal. No rashes or lesions Lymph nodes: Cervical, supraclavicular, and axillary nodes normal. Neurologic: Cranial nerves: III,IV,VI: extraocular muscles Abnl       Dwana Melena, MD 03/20/2017, 5:43 PM

## 2017-03-20 NOTE — Progress Notes (Signed)
STROKE TEAM PROGRESS NOTE   SUBJECTIVE (INTERVAL HISTORY) No family is at the bedside.  Pt mental status much improved. Pt still has dysarthria and disorientation to time, but otherwise non focal neuro exam.    OBJECTIVE Temp:  [98.1 F (36.7 C)] 98.1 F (36.7 C) (01/04 1850) Pulse Rate:  [86-120] 119 (01/04 2245) Resp:  [11-26] 18 (01/04 1905) BP: (97-170)/(56-104) 97/56 (01/04 2245) SpO2:  [93 %-100 %] 95 % (01/04 1850)  Recent Labs  Lab 03/19/17 1747 03/20/17 0227 03/20/17 0747 03/20/17 1235 03/20/17 1714  GLUCAP 125* 81 91 101* 90   Recent Labs  Lab 03/19/17 1759 03/19/17 1825 03/20/17 0246  NA 141 141 138  K 4.9 4.7 4.0  CL 100* 99* 99*  CO2 28  --  26  GLUCOSE 199* 195* 102*  BUN 31* 33* 36*  CREATININE 9.91* 9.60* 10.59*  CALCIUM 9.4  --  9.2   Recent Labs  Lab 03/19/17 1759 03/20/17 0246  AST 12* 7*  ALT 10* 8*  ALKPHOS 90 89  BILITOT 0.6 0.8  PROT 7.8 7.4  ALBUMIN 3.7 3.7   Recent Labs  Lab 03/19/17 1759 03/19/17 1825 03/20/17 0246  WBC 4.1  --  5.3  NEUTROABS 2.3  --   --   HGB 12.5* 13.9 11.9*  HCT 37.9* 41.0 37.3*  MCV 93.8  --  92.6  PLT 197  --  219   No results for input(s): CKTOTAL, CKMB, CKMBINDEX, TROPONINI in the last 168 hours. Recent Labs    03/19/17 1759  LABPROT 14.0  INR 1.09   No results for input(s): COLORURINE, LABSPEC, PHURINE, GLUCOSEU, HGBUR, BILIRUBINUR, KETONESUR, PROTEINUR, UROBILINOGEN, NITRITE, LEUKOCYTESUR in the last 72 hours.  Invalid input(s): APPERANCEUR     Component Value Date/Time   CHOL 208 (H) 03/20/2017 0246   TRIG 181 (H) 03/20/2017 0246   HDL 37 (L) 03/20/2017 0246   CHOLHDL 5.6 03/20/2017 0246   VLDL 36 03/20/2017 0246   LDLCALC 135 (H) 03/20/2017 0246   Lab Results  Component Value Date   HGBA1C 5.3 03/20/2017   No results found for: LABOPIA, COCAINSCRNUR, [place], [place], THCU, [place]  Recent Labs  Lab 03/19/17 [place] <10    I have personally reviewed the radiological  images below and agree with the radiology interpretations.  Ct Head Wo Contrast  Result Date: 03/19/2017 CLINICAL DATA:  72 year old male for follow-up of subdural hematoma. Unchanged altered mental status and encephalopathy. EXAM: CT HEAD WITHOUT CONTRAST TECHNIQUE: Contiguous axial images were obtained from the base of the skull through the vertex without intravenous contrast. COMPARISON:  03/12/2017 MR, 03/10/2017 CT and prior studies FINDINGS: Brain: The right posterior fossa subdural collection is no longer visualized. A new 1 cm hypodensity within the left cerebellum (image 7 -8) likely represents new ischemic changes/infarct or edema. Atrophy, chronic small-vessel white matter ischemic changes and remote bilateral basal ganglia and thalamic infarcts again noted. No new hemorrhage or extra-axial collection noted. Vascular: Atherosclerotic calcifications noted Skull: No acute abnormality Sinuses/Orbits: No acute abnormality Other: None IMPRESSION: 1. New 1 cm left cerebellar hypodensity which may represent ischemic changes/infarct/edema. No hemorrhage. 2. Interval resolution of right posterior fossa subdural collection. No evidence of new hemorrhage or extra-axial collection. 3. Atrophy, chronic small-vessel white matter ischemic changes and remote basal ganglia/thalamic infarcts. Electronically Signed   By: Margarette Canada M.D.   On: 03/19/2017 15:32   Ct Head Wo Contrast  Result Date: 03/10/2017 CLINICAL DATA:  Altered mental status for 2 days.  EXAM: CT HEAD WITHOUT CONTRAST TECHNIQUE: Contiguous axial images were obtained from the base of the skull through the vertex without intravenous contrast. COMPARISON:  02/01/2017 FINDINGS: Brain: Left cerebral hemisphere subdural blood has resolved from the previous examination. However, there is a new extra-axial low-density collection adjacent to the inferior right cerebellar hemisphere. This is seen on sequence 3, image 7 and best seen on the coronal reformats,  sequence 5, image 50. This low-density structure is mildly heterogeneous. This structure measures 2.6 x 1.0 x 0.7 cm. There is no significant mass effect from this new extra-axial collection. No evidence for midline shift, hydrocephalus or large infarct. Again noted is extensive white matter disease with old ischemic changes and remote lacune infarcts in the deep white matter tracts and deep gray nuclei. Vascular: No hyperdense vessel or unexpected calcification. Skull: Normal. Negative for fracture or focal lesion. Sinuses/Orbits: No acute finding. Other: None. IMPRESSION: New small subdural collection in the right posterior fossa. Findings are suggestive for a subacute subdural hematoma or hygroma. The left cerebral hemisphere subdural hematoma has resolved since 02/01/2017. Chronic white matter changes compatible with small vessel ischemic disease and remote lacune infarcts. Electronically Signed   By: Markus Daft M.D.   On: 03/10/2017 15:46   [name] Contrast  Result Date: 03/20/2017 CLINICAL DATA:  Initial evaluation for acute altered mental status, possible stroke. EXAM: MRI HEAD WITHOUT CONTRAST MRA HEAD WITHOUT CONTRAST TECHNIQUE: Multiplanar, multiecho pulse sequences of the brain and surrounding structures were obtained without intravenous contrast. Angiographic images of the head were obtained using MRA technique without contrast. COMPARISON:  Prior CT from 03/19/2017 as well as recent MRI from 03/12/2017. FINDINGS: MRI HEAD FINDINGS Brain: Advanced parenchymal volume loss. Chronic microvascular ischemic disease with scatter remote lacunar infarcts involving the bilateral basal ganglia and thalami and as well as the pons, stable from previous. 1 cm acute ischemic nonhemorrhagic infarct present within the mid left cerebellar hemisphere, corresponding to previously noted hypodensity seen on prior CT (series 3, image 9). No associated mass effect. For no other evidence for acute or subacute ischemia.  Gray-white matter differentiation otherwise maintained. No acute intracranial hemorrhage. Multiple remote chronic micro hemorrhages again noted, consistent with chronic hypertensive encephalopathy. No mass lesion, midline shift or mass effect. Ventricular prominence related to global parenchymal volume loss without hydrocephalus. Previously noted subdural collection at the right posterior fossa is nearly resolved now measuring up to 2 mm in maximal thickness without significant mass effect. No other new extra-axial collection. Major dural sinuses are grossly patent. Vascular: Major intracranial vascular flow voids maintained at the skullbase. Skull and upper cervical spine: Craniocervical junction normal. Bone marrow signal intensity within normal limits. No scalp soft tissue abnormality. Sinuses/Orbits: Globes orbital soft tissues within normal limits. Paranasal sinuses are largely clear. No significant mastoid effusion. Inner ear structures normal. Other: None. MRA HEAD FINDINGS ANTERIOR CIRCULATION: Distal cervical segments of the internal carotid arteries are patent with antegrade flow. Petrous, cavernous, and supraclinoid segments patent bilaterally without flow-limiting stenosis. Mild atheromatous irregularity within the carotid siphons. A1 segments, anterior communicating artery common anterior cerebral arteries widely patent. Atheromatous irregularity throughout the M1 segments and distal MCA branches without high-grade stenosis, slightly worse on the right. POSTERIOR CIRCULATION: Visualized vertebral arteries patent to the vertebrobasilar junction without stenosis. Partially visualized posterior inferior cerebral artery is patent. Basilar patent to its distal aspect without stenosis. Superior cerebral arteries and posterior cerebral artery is patent bilaterally without flow-limiting stenosis. Distal small vessel atheromatous irregularity within the PCA  branches bilaterally. No aneurysm. IMPRESSION: MRI HEAD  IMPRESSION: 1. 1 cm acute ischemic nonhemorrhagic infarct positioned at the central left cerebellar hemisphere. 2. Near interval resolution of previously seen right posterior fossa subdural collection, now measuring up to 2 mm in thickness without mass effect. 3. A advanced parenchymal volume loss with chronic micro vessel ischemic disease in scatter remote lacunar infarcts as above. 4. Multiple chronic micro hemorrhages throughout the brain, consistent with underlying chronic hypertension. MRA HEAD IMPRESSION: Intracranial atherosclerosis as above without large or proximal arterial branch occlusion. No high-grade or correctable stenosis within the intracranial circulation. Electronically Signed   By: Jeannine Boga M.D.   On: 03/20/2017 03:07   [name] Contrast  Result Date: 03/12/2017 CLINICAL DATA:  Altered level consciousness. Fell 1 month ago resulting in subdural hematoma. History of end-stage renal disease on dialysis, hypertension, diabetes, hyperlipidemia. EXAM: MRI HEAD WITHOUT CONTRAST TECHNIQUE: Multiplanar, multiecho pulse sequences of the brain and surrounding structures were obtained without intravenous contrast. COMPARISON:  CT HEAD March 10, 2017 and MRI of the head January 20, 2017 FINDINGS: BRAIN: No reduced diffusion to suggest acute ischemia. Numerous scattered supra and infratentorial microhemorrhage predominately in central sister fusion. Old LEFT basal ganglia infarct with susceptibility artifact. Old RIGHT basal ganglia lacunar infarct and multiple thalamus lacunar infarcts. Prominent basal ganglia and thalamus perivascular spaces assisted with chronic small vessel ischemic disease. Patchy to confluent supratentorial pontine white matter T2 hyperintensities. Old small bilateral cerebellar infarcts. Moderate to severe parenchymal brain volume loss for age. No hydrocephalus. Small T2 bright RIGHT posterior fossa extra-axial collection without mass effect. Resolution of  supratentorial subdural collections. Mild dural thickening associated with prior sub dural hematoma. VASCULAR: Normal major intracranial vascular flow voids present at skull base. SKULL AND UPPER CERVICAL SPINE: No abnormal sellar expansion. 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 Craniocervical junction maintained. SINUSES/ORBITS: The mastoid air-cells and included paranasal sinuses are well-aerated. The included ocular globes and orbital contents are non-suspicious. OTHER: None. IMPRESSION: 1. No acute intracranial process. 2. Small RIGHT posterior fossa subdural collection as seen on recent CT without mass effect. Resolution of supratentorial subdural hematoma. 3. Moderate to severe parenchymal brain volume loss. 4. Moderate to severe chronic small vessel ischemic disease. Multiple old small vessel infarcts. 5. Extensive micro hemorrhages associated with chronic hypertension. Electronically Signed   By: Elon Alas M.D.   On: 03/12/2017 16:20   Dg Chest Port 1 View  Result Date: 03/10/2017 CLINICAL DATA:  Altered mental status EXAM: PORTABLE CHEST 1 VIEW COMPARISON:  12/22/2016 FINDINGS: Mild cardiomegaly. No focal consolidation or effusion. Mild aortic atherosclerosis. No pneumothorax. IMPRESSION: Cardiomegaly.  Negative for edema or infiltrate. Electronically Signed   By: Donavan Foil M.D.   On: 03/10/2017 16:21   Mr Jodene Nam Head Wo Contrast  Result Date: 03/20/2017 CLINICAL DATA:  Initial evaluation for acute altered mental status, possible stroke. EXAM: MRI HEAD WITHOUT CONTRAST MRA HEAD WITHOUT CONTRAST TECHNIQUE: Multiplanar, multiecho pulse sequences of the brain and surrounding structures were obtained without intravenous contrast. Angiographic images of the head were obtained using MRA technique without contrast. COMPARISON:  Prior CT from 03/19/2017 as well as recent MRI from 03/12/2017. FINDINGS: MRI HEAD FINDINGS Brain: Advanced parenchymal volume loss.  Chronic microvascular ischemic disease with scatter remote lacunar infarcts involving the bilateral basal ganglia and thalami and as well as the pons, stable from previous. 1 cm acute ischemic nonhemorrhagic infarct present within the mid left cerebellar hemisphere, corresponding to previously noted hypodensity seen on  prior CT (series 3, image 9). No associated mass effect. For no other evidence for acute or subacute ischemia. Gray-white matter differentiation otherwise maintained. No acute intracranial hemorrhage. Multiple remote chronic micro hemorrhages again noted, consistent with chronic hypertensive encephalopathy. No mass lesion, midline shift or mass effect. Ventricular prominence related to global parenchymal volume loss without hydrocephalus. Previously noted subdural collection at the right posterior fossa is nearly resolved now measuring up to 2 mm in maximal thickness without significant mass effect. No other new extra-axial collection. Major dural sinuses are grossly patent. Vascular: Major intracranial vascular flow voids maintained at the skullbase. Skull and upper cervical spine: Craniocervical junction normal. Bone marrow signal intensity within normal limits. No scalp soft tissue abnormality. Sinuses/Orbits: Globes orbital soft tissues within normal limits. Paranasal sinuses are largely clear. No significant mastoid effusion. Inner ear structures normal. Other: None. MRA HEAD FINDINGS ANTERIOR CIRCULATION: Distal cervical segments of the internal carotid arteries are patent with antegrade flow. Petrous, cavernous, and supraclinoid segments patent bilaterally without flow-limiting stenosis. Mild atheromatous irregularity within the carotid siphons. A1 segments, anterior communicating artery common anterior cerebral arteries widely patent. Atheromatous irregularity throughout the M1 segments and distal MCA branches without high-grade stenosis, slightly worse on the right. POSTERIOR CIRCULATION:  Visualized vertebral arteries patent to the vertebrobasilar junction without stenosis. Partially visualized posterior inferior cerebral artery is patent. Basilar patent to its distal aspect without stenosis. Superior cerebral arteries and posterior cerebral artery is patent bilaterally without flow-limiting stenosis. Distal small vessel atheromatous irregularity within the PCA branches bilaterally. No aneurysm. IMPRESSION: MRI HEAD IMPRESSION: 1. 1 cm acute ischemic nonhemorrhagic infarct positioned at the central left cerebellar hemisphere. 2. Near interval resolution of previously seen right posterior fossa subdural collection, now measuring up to 2 mm in thickness without mass effect. 3. A advanced parenchymal volume loss with chronic micro vessel ischemic disease in scatter remote lacunar infarcts as above. 4. Multiple chronic micro hemorrhages throughout the brain, consistent with underlying chronic hypertension. MRA HEAD IMPRESSION: Intracranial atherosclerosis as above without large or proximal arterial branch occlusion. No high-grade or correctable stenosis within the intracranial circulation. Electronically Signed   By: Jeannine Boga M.D.   On: 03/20/2017 03:07    Carotid Doppler  Bilateral: 1-39% ICA stenosis. Vertebral artery flow is antegrade.  TTE pending  EEG pending   PHYSICAL EXAM  Temp:  [98.1 F (36.7 C)] 98.1 F (36.7 C) (01/04 1850) Pulse Rate:  [86-120] 119 (01/04 2245) Resp:  [11-26] 18 (01/04 1905) BP: (97-170)/(56-104) 97/56 (01/04 2245) SpO2:  [93 %-100 %] 95 % (01/04 1850)  General - thin built, well developed, in no apparent distress.  Ophthalmologic - fundi not visualized due to noncooperation.  Cardiovascular - Regular rate and rhythm.  Mental Status -  Level of arousal and orientation to place, and situation were intact, however, not orientated to time. Language including expression, naming, repetition, comprehension was assessed and found intact,  however paucity of speech, and moderate to severe dysarthria.  Cranial Nerves II - XII - II - Visual field intact OU. III, IV, VI - Extraocular movements intact. V - Facial sensation intact bilaterally. VII - Facial movement intact bilaterally. VIII - Hearing & vestibular intact bilaterally. X - Palate elevates symmetrically, poor denture. XI - Chin turning & shoulder shrug intact bilaterally. XII - Tongue protrusion intact.  Motor Strength - The patient's strength was symmetrical in all extremities and pronator drift was absent.  Bulk was normal and fasciculations were absent.   Motor Tone - Muscle  tone was assessed at the neck and appendages and was normal.  Reflexes - The patient's reflexes were symmetrical in all extremities and he had no pathological reflexes.  Sensory - Light touch, temperature/pinprick were assessed and were symmetrical.    Coordination - The patient had normal movements in the hands with no ataxia or dysmetria.  Tremor was absent.  Gait and Station - deferred.   ASSESSMENT/PLAN [name] is a 72 y.o. 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 No tPA given due to out of window.    AMS - multifactorial, including vascular dementia, ESRD, deconditioning, possible seizure, and hypotension  EEG pending  AMS improved after IV fluid  Ammonium within normal limits  UDS negative  No leukocytosis or fever  Incidental stroke:  left cerebellar small infarct - secondary to small vessel disease in the setting of hypotension  Resultant no new neuro findings  MRI  Left cerebellar small infarct   MRA  unremarkable  Carotid Doppler  negative  2D Echo  pending  LDL 135  HgbA1c 5.3  SCDs for VTE prophylaxis  Diet NPO time specified   No antithrombotic prior to admission, now on  aspirin 325 mg daily or ASA PR 300mg  daily.  Patient counseled to be compliant with his antithrombotic medications  Ongoing aggressive stroke risk factor management  Therapy recommendations:  Pending   Disposition:  Pending  Hx of stroke/SDH  03/2015 right-sided weakness, left small occipital periventricular infarct  05/2016 right thalamic infarct, MRA negative, EF 45-50%, carotid Doppler negative.  LDL 69 and A1c 4.9.  02/2017 admitted for memory decline, agitation, CT showed left temporal small SDH, and right posterior fossa hygroma.  MRI negative, EEG negative.  Diagnosed with encephalopathy with possible seizure activity.  Put on Depakote.  However, patient did not fill the medication after discharge.  This admission MRI showed right posterior fossa hygroma resolved.  Hypertension Stable Permissive hypertension (OK if <220/120) for 24-48 hours post stroke and then gradually normalized within 5-7 days.  Long term BP goal normotensive  Hyperlipidemia  Home meds:  lipitor 80   LDL 135, goal < 70  Resume lipitor 80 once passed swallow  Continue statin at discharge  Dysphagia   Have not passed swallow yet  Speech service on board  Currently NPO  ESRD on HD  Nephrology on board  Undergoing short term HD.   Tobacco abuse  Current smoker  Smoking cessation counseling provided  Pt is willing to quit  Other Stroke Risk Factors  Advanced age  Coronary artery disease  Obstructive sleep apnea  Other Active Problems  Vascular dementia  Prostate cancer  GIB due to duodenal AVM status post cauterization in [facility] day # 1  Rosalin Hawking, MD PhD Stroke Neurology 03/20/2017 10:55 PM    To contact Stroke Continuity provider, please refer to [email or web address] After hours, contact General Neurology

## 2017-03-20 NOTE — ED Notes (Addendum)
Patient uncooperative with all assessments. He acknowledges his wife at the bedside but does not talk to her. Wife states the patient is normally very friendly and talkative. Patient refuses to answer questions, states "I want you to leave me alone".   Speech is clear. Moves both upper extremities at will, is able to resist my moving his hands, but will not move them to follow my instruction. Patient refuses to move lower extremities according to instruction but was able to reposition self slightly in bed. Patient denies any needs at this time.

## 2017-03-20 NOTE — ED Notes (Signed)
Pt to MRI

## 2017-03-20 NOTE — Progress Notes (Signed)
PT Cancellation Note  Patient Details Name: [name] MRN: [id number] DOB: [birth year]   Cancelled Treatment:    Reason Eval/Treat Not Completed: Patient at procedure or test/unavailable. Pt in the process of being set up for an EEG. PT will continue to f/u with pt as available.   [place] 03/20/2017, 10:05 AM

## 2017-03-20 NOTE — ED Notes (Signed)
Pt CBG was 101, notified Arthur(RN)

## 2017-03-20 NOTE — Progress Notes (Signed)
Carotid artery duplex has been completed. 1-39% ICA stenosis bilaterally.  03/20/17 3:42 PM [name] RVT

## 2017-03-20 NOTE — Evaluation (Signed)
Clinical/Bedside Swallow Evaluation Patient Details  Name: [name] MRN: [id number] Date of Birth: 08-Jul-1945  Today's Date: 03/20/2017 Time: SLP Start Time (ACUTE ONLY): 1116 SLP Stop Time (ACUTE ONLY): 1138 SLP Time Calculation (min) (ACUTE ONLY): 22 min  Past Medical History:  Past Medical History:  Diagnosis Date  . 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 Anemia, iron deficiency 03/24/2011   a. Recurrent GI bleed, tx with periodic iron infusions.  . 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 AVM (arteriovenous malformation) of duodenum, acquired    egds in 01/2012, 04/2011  . BPH (benign prostatic hyperplasia)   . CAD (coronary artery disease)    a. Cath 09/2012: moderate borderline CAD in mid LAD/small diagonal branch, mild RCA stenosis, to be managed medically   . Chronic lower back pain   . Diabetic peripheral neuropathy ([place]) 2014   foot pain.  . Essential hypertension 02/01/2012  . Fatty liver    on CT of 11/2010  . GERD (gastroesophageal reflux disease)   . GI bleed    a. Recurrent GI bleed, tx with IV iron. b. Per heme notes - likely AVMs 04/2012 (tx with cauterization several months ago).  . Hematuria    a. Urology note scan from 07/2012: cystoscopy without evidence for bladder lesion, only lateral hypertrophy of posterior urethra, bladder impression from BPH. b. Pt states he had "some tests" scheduled for later in July 2014.  . High cholesterol   . Hyperlipidemia 02/01/2012  . Hypertension   . Hypertensive urgency 07/06/2016  . 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 Orthostatic hypotension    a. Tx with florinef.  . Peripheral neuropathy   . Polyp, colonic    Colonoscopy 01/2012 "benign" polyp  . Prostate cancer ([place])   . Sleep apnea    "had mask; couldn't sleep in it" (09/20/2012)  . Smoker   . SOB (shortness of breath) 07/06/2016  . Stage III chronic kidney disease ([name])    a. Stage 3 (DM with complications ->CKD, peripheral neuropathy).  .  Stroke ([place])    x 2  . Symptomatic anemia 04/03/2015  . Tinea pedis 05/24/2012  . Tobacco use 02/01/2012  . Type 2 diabetes, uncontrolled, with neuropathy ([place]) 02/01/2012  . Type II diabetes mellitus ([place])    a. Dx 1994, uncontrolled.    Past Surgical History:  Past Surgical History:  Procedure Laterality Date  . ENTEROSCOPY N/A 03/01/2013   Procedure: ENTEROSCOPY;  Surgeon: Inda Castle, MD;  Location: [place];  Service: Endoscopy;  Laterality: N/A;  . ENTEROSCOPY N/A 06/26/2016   Procedure: ENTEROSCOPY;  Surgeon: Milus Banister, MD;  Location: [place];  Service: Endoscopy;  Laterality: N/A;  this is a push enteroscopy  . ENTEROSCOPY N/A 10/23/2016   Procedure: ENTEROSCOPY;  Surgeon: Gatha Mayer, MD;  Location: [facility] ENDOSCOPY;  Service: Endoscopy;  Laterality: N/A;  . GIVENS CAPSULE STUDY N/A 04/05/2015   Procedure: GIVENS CAPSULE STUDY;  Surgeon: Gatha Mayer, MD;  Location: [place];  Service: Endoscopy;  Laterality: N/A;  . INGUINAL HERNIA REPAIR Right 2011  . LEFT HEART CATH AND CORONARY ANGIOGRAPHY N/A 12/23/2016   Procedure: LEFT HEART CATH AND CORONARY ANGIOGRAPHY;  Surgeon: Leonie Man, MD;  Location: [place] [facility];  Service: Cardiovascular;  Laterality: N/A;  . LEFT HEART CATHETERIZATION WITH CORONARY ANGIOGRAM N/A 09/21/2012   Procedure: LEFT HEART CATHETERIZATION WITH CORONARY ANGIOGRAM;  Surgeon: Peter M Martinique, MD;  Location: [facility] CATH LAB;  Service: Cardiovascular;  Laterality: N/A;  .  LUMBAR DISC SURGERY  1980's  . LYMPHADENECTOMY Bilateral 01/19/2013   Procedure: LYMPHADENECTOMY;  Surgeon: Bernestine Amass, MD;  Location: [facility];  Service: Urology;  Laterality: Bilateral;  . ROBOT ASSISTED LAPAROSCOPIC RADICAL PROSTATECTOMY N/A 01/19/2013   Procedure: ROBOTIC ASSISTED LAPAROSCOPIC RADICAL PROSTATECTOMY;  Surgeon: Bernestine Amass, MD;  Location: [facility];  Service: Urology;  Laterality: N/A;  . SHOULDER OPEN ROTATOR CUFF REPAIR Left 1980's   HPI:  72  y.o. male with history of ESRD on hemodialysis Monday with today, hypertension, diabetes mellitus on diet who was recently admitted for acute encephalopathy and placed on Depakote and followed up with his PCP today and had CT head done because patient was found to be increasingly confused last 2 days.  CT head was showing possible left cerebellar stroke and patient was referred to the ER.  Patient recently admitted for acute encephalopathy and cause was not clear.  Had extensive workup done and eventually was discharged on Depakote.  As per the wife who provided history the ER physician patient became more confused last 2 days MRI head on 03/20/16 indicated 1 cm acute ischemic nonhemorrhagic infarct present within the mid left cerebellar hemisphere, corresponding to previously noted hypodensity seen on prior CT (series 3, image 9). No associated mass effect. For no other evidence for acute or subacute ischemia. Gray-white matter differentiation otherwise maintained. No acute intracranial hemorrhage. Multiple remote chronic micro hemorrhages again noted, consistent with chronic hypertensive encephalopathy.  Assessment / Plan / Recommendation Clinical Impression   Pt exhibits oropharyngeal dysphagia characterized by prolonged oral transit, oral holding, impaired mastication, delay in the initiation of the swallow, multiple swallows, delayed throat clearing/cough and reflexive cough at end of PO intake/BSE; recommend NPO until instrumental assessment is completed (as able); ST will f/u for PO readiness SLP Visit Diagnosis: Dysphagia, oropharyngeal phase (R13.12)    Aspiration Risk  Moderate aspiration risk    Diet Recommendation   NPO   Medication Administration: Other (Comment)(Crushed with puree if oral meds required, but otherwise IV)    Other  Recommendations Oral Care Recommendations: Oral care QID   Follow up Recommendations Other (comment)(TBD)      Frequency and Duration min 2x/week  1  week       Prognosis Prognosis for Safe Diet Advancement: Fair Barriers to Reach Goals: Severity of deficits      Swallow Study   General Date of Onset: 03/19/17 HPI: 72 y.o. male with history of ESRD on hemodialysis Monday with today, hypertension, diabetes mellitus on diet who was recently admitted for acute encephalopathy and placed on Depakote and followed up with his PCP today and had CT head done because patient was found to be increasingly confused last 2 days.  CT head was showing possible left cerebellar stroke and patient was referred to the ER.  Patient recently admitted for acute encephalopathy and cause was not clear.  Had extensive workup done and eventually was discharged on Depakote.  As per the wife who provided history the ER physician patient became more confused last 2 days Type of Study: Bedside Swallow Evaluation Previous Swallow Assessment: passed NSSS Diet Prior to this Study: NPO Temperature Spikes Noted: No Respiratory Status: Room air History of Recent Intubation: No Behavior/Cognition: Alert;Cooperative;Confused Oral Cavity Assessment: Dry Oral Care Completed by SLP: Yes Oral Cavity - Dentition: Poor condition;Missing dentition Vision: Functional for self-feeding Self-Feeding Abilities: Able to feed self;Needs assist;Other (Comment)(removed restraints for PO intake) Patient Positioning: Upright in bed Baseline Vocal Quality: Hoarse;Low vocal intensity Volitional  Cough: Weak Volitional Swallow: Able to elicit    Oral/Motor/Sensory Function Overall Oral Motor/Sensory Function: Generalized oral weakness Facial Symmetry: Within Functional Limits   Ice Chips Ice chips: Impaired Presentation: Spoon Pharyngeal Phase Impairments: Suspected delayed Swallow;Multiple swallows   Thin Liquid Thin Liquid: Impaired Presentation: Cup;Spoon Pharyngeal  Phase Impairments: Suspected delayed Swallow;Multiple swallows;Wet Vocal Quality;Throat Clearing - Delayed;Cough -  Delayed    Nectar Thick Nectar Thick Liquid: Not tested   Honey Thick Honey Thick Liquid: Not tested   Puree Puree: Impaired Presentation: Spoon Pharyngeal Phase Impairments: Suspected delayed Swallow;Multiple swallows;Wet Vocal Quality;Cough - Delayed   Solid      Solid: Impaired Presentation: Self Fed Oral Phase Impairments: Impaired mastication Oral Phase Functional Implications: Prolonged oral transit;Impaired mastication Pharyngeal Phase Impairments: Suspected delayed Swallow;Multiple swallows;Throat Clearing - Delayed Other Comments: (reflexive cough noted after all PO consumption)        Elvina Sidle, M.S., CCC-SLP 03/20/2017,2:07 PM

## 2017-03-20 NOTE — ED Notes (Signed)
Patient shouting "Get the hell out" to person attempting to perform EEG.

## 2017-03-21 ENCOUNTER — Inpatient Hospital Stay (HOSPITAL_COMMUNITY): Payer: Medicare Other

## 2017-03-21 DIAGNOSIS — I5022 Chronic systolic (congestive) heart failure: Secondary | ICD-10-CM

## 2017-03-21 LAB — ECHOCARDIOGRAM COMPLETE
CHL CUP TV REG PEAK VELOCITY: 236 cm/s
FS: 18 % — AB (ref 28–44)
HEIGHTINCHES: 67 in
IVS/LV PW RATIO, ED: 0.84
LA ID, A-P, ES: 20 mm
LA diam index: 1.24 cm/m2
LA vol index: 11.2 mL/m2
LAVOL: 18 mL
LAVOLA4C: 13.9 mL
LEFT ATRIUM END SYS DIAM: 20 mm
LVOT VTI: 8.79 cm
LVOT peak vel: 61.5 cm/s
Lateral S' vel: 6.34 cm/s
PW: 12.4 mm — AB (ref 0.6–1.1)
RV sys press: 25 mmHg
TAPSE: 16.8 mm
TR max vel: 236 cm/s
WEIGHTICAEL: 1858.92 [oz_av]

## 2017-03-21 LAB — GLUCOSE, CAPILLARY
GLUCOSE-CAPILLARY: 114 mg/dL — AB (ref 65–99)
GLUCOSE-CAPILLARY: 86 mg/dL (ref 65–99)
GLUCOSE-CAPILLARY: 86 mg/dL (ref 65–99)
Glucose-Capillary: 90 mg/dL (ref 65–99)
Glucose-Capillary: 123 mg/dL — ABNORMAL HIGH (ref 65–99)
Glucose-Capillary: 159 mg/dL — ABNORMAL HIGH (ref 65–99)
Glucose-Capillary: 106 mg/dL — ABNORMAL HIGH (ref 65–99)

## 2017-03-21 LAB — MRSA PCR SCREENING: MRSA BY PCR: NEGATIVE

## 2017-03-21 MED ORDER — FAMOTIDINE 10 MG PO TABS
10.0000 mg | ORAL_TABLET | Freq: Every day | ORAL | Status: DC
  Filled 2017-03-21 (×2): qty 1

## 2017-03-21 MED ORDER — DRONABINOL 2.5 MG PO CAPS: 2.5000 mg | ORAL_CAPSULE | Freq: Every day | ORAL | Status: DC

## 2017-03-21 MED ORDER — SODIUM CHLORIDE 0.9 % IV BOLUS (SEPSIS)
500.0000 mL | Freq: Once | INTRAVENOUS | Status: AC
  Administered 2017-03-21: 500 mL via INTRAVENOUS

## 2017-03-21 MED ORDER — ATORVASTATIN CALCIUM 80 MG PO TABS: 80.0000 mg | ORAL_TABLET | Freq: Every day | ORAL | Status: DC

## 2017-03-21 MED ORDER — CARVEDILOL 3.125 MG PO TABS
3.1250 mg | ORAL_TABLET | Freq: Two times a day (BID) | ORAL | Status: DC
  Filled 2017-03-21 (×2): qty 1

## 2017-03-21 NOTE — Progress Notes (Signed)
SLP Cancellation Note  Patient Details Name: RAJVIR ERNSTER MRN: [id number] DOB: 11/13/1945   Cancelled treatment:       Reason Eval/Treat Not Completed: Patient at procedure or test/unavailable. Pt having echo; will continue efforts.  Deneise Lever, [place], CCC-SLP Speech-Language Pathologist [phone]   Aliene Altes 03/21/2017, 9:46 AM

## 2017-03-21 NOTE — Progress Notes (Signed)
  Speech Language Pathology Treatment: Dysphagia  Patient Details Name: [name] MRN: [id number] DOB: 07/10/1945 Today's Date: 03/21/2017 Time: 1015-1030 SLP Time Calculation (min) (ACUTE ONLY): 15 min  Assessment / Plan / Recommendation Clinical Impression  Pt seen for re-evaluation to determine readiness for POs and/or instrumental assessment. Pt's wife present and very concerned about lack of nutrition, states pt was eating fine yesterday. SLP explained aspiration risk in the setting of acute CVA and altered mentation. Pt is intermittently alert, not following commands though he does open his eyes intermittently. Refuses ice chip. Noted strong coughing after dry swallow, concerning for aspiration of secretions. Education provided to pt's wife re: swallow physiology and risks of aspiration. Pt did take single sip of water from a cup with assistance, with no coughing noted. Refused subsequent attempts and trials of puree, stating "No," forcefully. Recommend he remain NPO for now, meds via alternative means or if oral meds needed can crush in puree. Terminated session as MD arrived to discuss MRI findings with pt/wife. RN to page if pt becomes more alert or is requesting PO. Otherwise will f/u at bedside next date.    HPI HPI: 72 y.o. male with history of ESRD on hemodialysis Monday with today, hypertension, diabetes mellitus on diet who was recently admitted for acute encephalopathy and placed on Depakote and followed up with his PCP today and had CT head done because patient was found to be increasingly confused last 2 days.  CT head was showing possible left cerebellar stroke and patient was referred to the ER.  Patient recently admitted for acute encephalopathy and cause was not clear.  Had extensive workup done and eventually was discharged on Depakote.  As per the wife who provided history the ER physician patient became more confused last 2 days      SLP Plan  Continue with current plan of  care       Recommendations  Diet recommendations: NPO Medication Administration: Via alternative means(can give oral meds crushed in puree)                Oral Care Recommendations: Oral care QID Follow up Recommendations: Other (comment)(tbd) SLP Visit Diagnosis: Dysphagia, oropharyngeal phase (R13.12) Plan: Continue with current plan of care       [place], [place], CCC-SLP Speech-Language Pathologist [phone]  Aliene Altes 03/21/2017, 11:05 AM

## 2017-03-21 NOTE — Progress Notes (Signed)
STROKE TEAM PROGRESS NOTE    HISTORY FROM CONSULT 03/19/2017 AMS - [name] is an 72 y.o. male ESRD MWF @ [facility] in [facility] with last HD Wed. He had a traumatic subdural hematoma 01/2017 +  recent encephalopathy with "less talking." When I spoke with him he was able to nod yes or no and it seemed that he was having a difficult time articulating his thoughts but understood the questions. Repeat CT scan showed a new CVA which is why he was sent to the ED. He denies dyspnea, CP, nausea, dysgeusia, weakness; he also denies f/c/n/v. At points by history and after his wife called back he has been combative; the dysarthria is also chronic.    SUBJECTIVE (INTERVAL HISTORY) No events overnight   OBJECTIVE Temp:  [97.4 F (36.3 C)-98.1 F (36.7 C)] 98.1 F (36.7 C) (01/05 0340) Pulse Rate:  [89-120] 101 (01/05 0340) Cardiac Rhythm: Sinus tachycardia (01/04 2358) Resp:  [13-23] 15 (01/05 0340) BP: (96-170)/(56-93) 104/69 (01/05 0340) SpO2:  [95 %-100 %] 97 % (01/05 0340) Weight:  [116 lb 2.9 oz (52.7 kg)] 116 lb 2.9 oz (52.7 kg) (01/04 2359)  Recent Labs  Lab 03/20/17 0747 03/20/17 1235 03/20/17 1714 03/21/17 0038 03/21/17 0339  GLUCAP 91 101* 90 86 86   Recent Labs  Lab 03/19/17 1759 03/19/17 1825 03/20/17 0246 03/20/17 2300  NA 141 141 138  --   K 4.9 4.7 4.0  --   CL 100* 99* 99*  --   CO2 28  --  26  --   GLUCOSE 199* 195* 102*  --   BUN 31* 33* 36*  --   CREATININE 9.91* 9.60* 10.59*  --   CALCIUM 9.4  --  9.2  --   PHOS  --   --   --  1.6*   Recent Labs  Lab 03/19/17 1759 03/20/17 0246  AST 12* 7*  ALT 10* 8*  ALKPHOS 90 89  BILITOT 0.6 0.8  PROT 7.8 7.4  ALBUMIN 3.7 3.7   Recent Labs  Lab 03/19/17 1759 03/19/17 1825 03/20/17 0246  WBC 4.1  --  5.3  NEUTROABS 2.3  --   --   HGB 12.5* 13.9 11.9*  HCT 37.9* 41.0 37.3*  MCV 93.8  --  92.6  PLT 197  --  219   No results for input(s): CKTOTAL, CKMB, CKMBINDEX, TROPONINI in the last 168  hours. Recent Labs    03/19/17 1759  LABPROT 14.0  INR 1.09   No results for input(s): COLORURINE, LABSPEC, PHURINE, GLUCOSEU, HGBUR, BILIRUBINUR, KETONESUR, PROTEINUR, UROBILINOGEN, NITRITE, LEUKOCYTESUR in the last 72 hours.  Invalid input(s): APPERANCEUR     Component Value Date/Time   CHOL 208 (H) 03/20/2017 0246   TRIG 181 (H) 03/20/2017 0246   HDL 37 (L) 03/20/2017 0246   CHOLHDL 5.6 03/20/2017 0246   VLDL 36 03/20/2017 0246   LDLCALC 135 (H) 03/20/2017 0246   Lab Results  Component Value Date   HGBA1C 5.3 03/20/2017   No results found for: LABOPIA, COCAINSCRNUR, [place], [place], [facility], [place]  Recent Labs  Lab 03/19/17 [place] <10    I have personally reviewed the radiological images below and agree with the radiology interpretations.  Ct Head Wo Contrast 03/19/2017 IMPRESSION:  1. New 1 cm left cerebellar hypodensity which may represent ischemic changes/infarct/edema. No hemorrhage.  2. Interval resolution of right posterior fossa subdural collection. No evidence of new hemorrhage or extra-axial collection.  3. Atrophy, chronic  small-vessel white matter ischemic changes and remote basal ganglia/thalamic infarcts.    Ct Head Wo Contrast  03/10/2017 IMPRESSION:  New small subdural collection in the right posterior fossa. Findings are suggestive for a subacute subdural hematoma or hygroma. The left cerebral hemisphere subdural hematoma has resolved since 02/01/2017. Chronic white matter changes compatible with small vessel ischemic disease and remote lacune infarcts.    [name] Contrast 03/20/2017 IMPRESSION:   MRI HEAD 1. 1 cm acute ischemic nonhemorrhagic infarct positioned at the central left cerebellar hemisphere.  2. Near interval resolution of previously seen right posterior fossa subdural collection, now measuring up to 2 mm in thickness without mass effect.  3. A advanced parenchymal volume loss with chronic micro vessel ischemic disease in  scatter remote lacunar infarcts as above.  4. Multiple chronic micro hemorrhages throughout the brain, consistent with underlying chronic hypertension.   MRA HEAD Intracranial atherosclerosis as above without large or proximal arterial branch occlusion.  No high-grade or correctable stenosis within the intracranial circulation.     [name] Contrast 03/12/2017 IMPRESSION:  1. No acute intracranial process.  2. Small RIGHT posterior fossa subdural collection as seen on recent CT without mass effect. Resolution of supratentorial subdural hematoma.  3. Moderate to severe parenchymal brain volume loss.  4. Moderate to severe chronic small vessel ischemic disease. Multiple old small vessel infarcts.  5. Extensive micro hemorrhages associated with chronic hypertension.    Dg Chest Port 1 View 03/10/2017 IMPRESSION:  Cardiomegaly.  Negative for edema or infiltrate.     Carotid Doppler  Bilateral: 1-39% ICA stenosis. Vertebral artery flow is antegrade.  TTE: Tachycardia and poor visualization limits evaluation of LV   systolic and diastolic function. Consider repeat study when heart   rates are better controlled.  EEG pending   PHYSICAL EXAM  Temp:  [97.4 F (36.3 C)-98.1 F (36.7 C)] 98.1 F (36.7 C) (01/05 0340) Pulse Rate:  [89-120] 101 (01/05 0340) Resp:  [13-23] 15 (01/05 0340) BP: (96-170)/(56-93) 104/69 (01/05 0340) SpO2:  [95 %-100 %] 97 % (01/05 0340) Weight:  [116 lb 2.9 oz (52.7 kg)] 116 lb 2.9 oz (52.7 kg) (01/04 2359)  General - thin built, well developed, in no apparent distress.  Ophthalmologic - fundi not visualized due to noncooperation.  Cardiovascular - Regular rate and rhythm.  Mental Status -  Level of arousal and orientation to place, and situation were intact, however, not orientated to time. Language including expression, naming, repetition, comprehension was assessed and found intact, however paucity of speech, and moderate to severe  dysarthria.  Cranial Nerves II - XII - II - Visual field intact OU. III, IV, VI - Extraocular movements intact. V - Facial sensation intact bilaterally. VII - Facial movement intact bilaterally. VIII - Hearing & vestibular intact bilaterally. X - Palate elevates symmetrically, poor denture. XI - Chin turning & shoulder shrug intact bilaterally. XII - Tongue protrusion intact.  Motor Strength - The patient's strength was symmetrical in all extremities and pronator drift was absent.  Bulk was normal and fasciculations were absent.   Motor Tone - Muscle tone was assessed at the neck and appendages and was normal.  Reflexes - The patient's reflexes were symmetrical in all extremities and he had no pathological reflexes.  Sensory - Light touch, temperature/pinprick were assessed and were symmetrical.    Coordination - The patient had normal movements in the hands with no ataxia or dysmetria.  Tremor was absent.  Gait and Station - deferred.  ASSESSMENT/PLAN [name] is a 72 y.o. 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 No tPA given due to out of window.    AMS - multifactorial, including vascular dementia, ESRD, deconditioning, possible seizure, and hypotension  EEG 12/26 no epileptiform activity  AMS improved after IV fluid  Ammonium within normal limits  UDS negative  No leukocytosis or fever  Incidental stroke:  left cerebellar small infarct - secondary to small vessel disease in the setting of hypotension  Resultant no new neuro findings  MRI  Left cerebellar small infarct   MRA  unremarkable  Carotid Doppler  negative  2D Echo No clot of pfo seen but recommend repeat due to quality   LDL 135  HgbA1c 5.3  SCDs for VTE prophylaxis  Diet NPO time specified Except for: Other (See  Comments)   No antithrombotic prior to admission, now on aspirin 325 mg daily or ASA PR 300mg  daily.Recommend Plavix on discharge.  Patient counseled to be compliant with his antithrombotic medications  Ongoing aggressive stroke risk factor management  Therapy recommendations:  Pending   Disposition:  Pending  Hx of stroke/SDH  03/2015 right-sided weakness, left small occipital periventricular infarct  05/2016 right thalamic infarct, MRA negative, EF 45-50%, carotid Doppler negative.  LDL 69 and A1c 4.9.  02/2017 admitted for memory decline, agitation, CT showed left temporal small SDH, and right posterior fossa hygroma.  MRI negative, EEG negative.  Diagnosed with encephalopathy with possible seizure activity.  Put on Depakote.  However, patient did not fill the medication after discharge.  This admission MRI showed right posterior fossa hygroma resolved.  Hypertension Stable Permissive hypertension (OK if <220/120) for 24-48 hours post stroke and then gradually normalized within 5-7 days.  Long term BP goal normotensive  Hyperlipidemia  Home meds:  lipitor 80   LDL 135, goal < 70  Resume lipitor 80 once passed swallow  Continue statin at discharge  Dysphagia   Have not passed swallow yet  Speech service on board  Currently NPO  ESRD on HD  Nephrology on board  Undergoing short term HD.   Tobacco abuse  Current smoker  Smoking cessation counseling provided  Pt is willing to quit  Other Stroke Risk Factors  Advanced age  Coronary artery disease  Obstructive sleep apnea  Other Active Problems  Vascular dementia  Prostate cancer  GIB due to duodenal AVM status post cauterization in 2014  Hypophosphatemia  Mild anemia   Hospital day # 2   Stroke will sign off, please do not hesitate to reconsult if necessary  To contact Stroke Continuity provider, please refer to [email or web address] After hours, contact General Neurology

## 2017-03-21 NOTE — Progress Notes (Signed)
Admit: 03/19/2017 LOS: 2  33M ESRD MWF [facility] with acute CVA  Subjective:  HD yesterday: post weight 52.7kg, 1.5L UF; tolerated tx; 4h treatment Wife [name] at bedside, updated Discussed decline over past 2 month, explored goals of care; she remains hopeful he will recover but acknowledges he has done poorly lately MRI/MRA brain with acute ischemic infarct in L cerebellar hemisphere; resolved SDH; advanced chronic ischemic disease   01/04 0701 - 01/05 0700 In: 0  Out: 1582   Filed Weights   03/19/17 1745 03/20/17 2359  Weight: 54.4 kg (120 lb) 52.7 kg (116 lb 2.9 oz)    Scheduled Meds: .  stroke: mapping our early stages of recovery book   Does not apply Once  . aspirin  300 mg Rectal Daily   Or  . aspirin  325 mg Oral Daily  . atorvastatin  80 mg Oral QHS  . carvedilol  3.125 mg Oral BID WC  . dronabinol  2.5 mg Oral QAC lunch  . famotidine  10 mg Oral Daily  . insulin aspart  0-9 Units Subcutaneous Q4H   Continuous Infusions: . sodium chloride     PRN Meds:.acetaminophen **OR** acetaminophen (TYLENOL) oral liquid 160 mg/5 mL **OR** acetaminophen  Current Labs: reviewed    Physical Exam:  Blood pressure 105/65, pulse (!) 101, temperature 98 F (36.7 C), temperature source Oral, resp. rate 16, height 5\' 7"  (1.702 m), weight 52.7 kg (116 lb 2.9 oz), SpO2 100 %. Awakens to stimulus Tachy, regular CTAB Trace LEE +B/T of LFA AVF  A 1. ESRD MWF [facility] 2. New Small Cerebellar CVA 3. AMS with Dementia 4. Progressive decline/FTT 5. Traumatic SDH 01/2017 seen at [facility] 6. Chronic sCHF 7. CAD 8. HTN, stable 9. Anemia, stable 10. CKD BMD: P was 1.6 post HD I believe, follow, not on binders  11. ? Seizures  P 1.  Cont HD on MWF schedule 2. Req outpt HD records   Pearson Grippe MD 03/21/2017, 11:07 AM  Recent Labs  Lab 03/19/17 1759 03/19/17 1825 03/20/17 0246 03/20/17 2300  NA 141 141 138  --   K 4.9 4.7 4.0  --   CL 100* 99* 99*  --   CO2 28  --  26  --    GLUCOSE 199* 195* 102*  --   BUN 31* 33* 36*  --   CREATININE 9.91* 9.60* 10.59*  --   CALCIUM 9.4  --  9.2  --   PHOS  --   --   --  1.6*   Recent Labs  Lab 03/19/17 1759 03/19/17 1825 03/20/17 0246  WBC 4.1  --  5.3  NEUTROABS 2.3  --   --   HGB 12.5* 13.9 11.9*  HCT 37.9* 41.0 37.3*  MCV 93.8  --  92.6  PLT 197  --  219

## 2017-03-21 NOTE — Progress Notes (Signed)
Spouse at bedside and very concerned about pt, MD called to come and speak with spouse. Speech therapy at bedside. ECHO completed

## 2017-03-21 NOTE — Progress Notes (Signed)
PROGRESS NOTE    [name]  EUM:[id number] DOB: [birth year] DOA: 03/19/2017 PCP: Mackie Pai, PA-C      Brief Narrative:  [name] is a 72 yo M with ESRD on MWF HD, HTN, DM and recent traumatic subdural hematoma, not evacuated who presents with incidental finding on head CT.      Assessment & Plan:  Principal Problem:   Acute CVA (cerebrovascular accident) ([place]) Active Problems:   Anemia of renal disease   ESRD (end stage renal disease) ([place])   Acute encephalopathy   DM (diabetes mellitus), type 2 with renal complications ([place])   Stroke This was incidental.  The patient was admitted from 11/18 to 11/27 to [facility] with traumatic subdural hematoma, not requiring evacuation, discharged to SNF.  Yesterday he had a CT head as routine follow up for his SDH and was found to have new small cerebellar infarct and so he was sent to the ER. -Transfer to telemetry -Continue daily aspirin 325 mg -Restart statin -Restart Coreg, gradually normalize BP now -Carotid doppler shows <40% -Echocardiogram ordered -NPO and SLP consult --> MBS today? -PT/OT consultation -Consult to Neurology, appreciate recommendations    Encephalopathy Delirium He was admitted from 12/25 to 12/27 here at [facility] for agitation, and MRI brain, B12, ammonia, TSH, HIV, RPR were all normal.  He was treated empirically for seizures with Depakote per Neurology and his symptoms improved.   Currently, this appears to be some delirium from stroke superimposed on dementia.  There is no evidence of infection and no electrolyte abnormalities.  Seizures have been postulated, but current Neurologists appear to be agnostic on this issue. -Consult to Neurology -Depakote 250 BID was not restarted at last discharge, nor here; will discuss with wife today -EEG if able   ESRD -Consult to Nephrology for routine HD  Chronic systolic CHF CAD Hypertension -Continue statin -Continue full dose aspirin  Diabetes This is diet  controlled per notes. -SSI q4hrs  Anemia of renal disease Stable around baseline ~12.  Possible seizures As above  Recent subdural hematoma  Other medications -Continue venlafaxine and ranitidine and dronabinol      Core measures: -VTE prophylaxis ordered at time of admission -Aspirin ordered at admission -Atrial fibrillation: not known, none on tele -tPA not given because of outside stroke window -Dysphagia screen ordered in ER -Lipids ordered -PT eval ordered -Non-smoker     DVT prophylaxis: SCDs Code Status: FULL Family Communication: None present Disposition Plan: PT eval, stroke work up, likely home in 1-2 days.   Consultants:   Neurology  Nephrology  Procedures:   MRI brain  MRA head  Carotid US  Echocardiogram       Subjective: Lying in bed, wakes to my entrance.  Only says no.  Refuses exam.  States "no" when asked if anything helpful can be offered.  Per nursing, no concerns, no new fever.  Agitation before dinner last night, none overnight.  Objective: Vitals:   03/20/17 2259 03/20/17 2359 03/21/17 0340 03/21/17 0749  BP: 109/69 (!) 96/56 104/69 105/65  Pulse: (!) 108 (!) 109 (!) 101 (!) 101  Resp: 18 18 15 16   Temp: 97.7 F (36.5 C) (!) 97.4 F (36.3 C) 98.1 F (36.7 C) 98 F (36.7 C)  TempSrc: Oral Oral Oral Oral  SpO2: 96% 96% 97% 100%  Weight:  52.7 kg (116 lb 2.9 oz)    Height:  5\' 7"  (1.702 m)      Intake/Output Summary (Last 24 hours) at 03/21/2017 5400  Last data filed at 03/21/2017 0500 Gross per 24 hour  Intake 0 ml  Output 1582 ml  Net -1582 ml   Filed Weights   03/19/17 1745 03/20/17 2359  Weight: 54.4 kg (120 lb) 52.7 kg (116 lb 2.9 oz)    Examination: General appearance: Elderly adult male, awake lying in bed, alert to me, states "no"only.   HEENT: Anicteric, conjunctiva pink, lids and lashes normal. No nasal deformity, discharge, epistaxis.  Lips moist.  Dentition missing.   Cardiac: Refuses exam.    Respiratory: Refuses exam. Abdomen: Refuses exam.   Neuro: Awake, EOM appear intact, he tracks me.   Otherwise refuses exam. Psych: Unable to assess.    Data Reviewed: I have personally reviewed following labs and imaging studies:  CBC: Recent Labs  Lab 03/19/17 1759 03/19/17 1825 03/20/17 0246  WBC 4.1  --  5.3  NEUTROABS 2.3  --   --   HGB 12.5* 13.9 11.9*  HCT 37.9* 41.0 37.3*  MCV 93.8  --  92.6  PLT 197  --  202   Basic Metabolic Panel: Recent Labs  Lab 03/19/17 1759 03/19/17 1825 03/20/17 0246 03/20/17 2300  NA 141 141 138  --   K 4.9 4.7 4.0  --   CL 100* 99* 99*  --   CO2 28  --  26  --   GLUCOSE 199* 195* 102*  --   BUN 31* 33* 36*  --   CREATININE 9.91* 9.60* 10.59*  --   CALCIUM 9.4  --  9.2  --   PHOS  --   --   --  1.6*   GFR: Estimated Creatinine Clearance: 4.8 mL/min (A) (by C-G formula based on SCr of 10.59 mg/dL (H)). Liver Function Tests: Recent Labs  Lab 03/19/17 1759 03/20/17 0246  AST 12* 7*  ALT 10* 8*  ALKPHOS 90 89  BILITOT 0.6 0.8  PROT 7.8 7.4  ALBUMIN 3.7 3.7   No results for input(s): LIPASE, AMYLASE in the last 168 hours. Recent Labs  Lab 03/20/17 0246  AMMONIA 23   Coagulation Profile: Recent Labs  Lab 03/19/17 1759  INR 1.09   Cardiac Enzymes: No results for input(s): CKTOTAL, CKMB, CKMBINDEX, TROPONINI in the last 168 hours. BNP (last 3 results) Recent Labs    07/09/16 1438 07/31/16 1104 08/19/16 1155  PROBNP 1,044.0* 343.0* 349.0*   HbA1C: Recent Labs    03/20/17 0246  HGBA1C 5.3   CBG: Recent Labs  Lab 03/20/17 0747 03/20/17 1235 03/20/17 1714 03/21/17 0038 03/21/17 0339  GLUCAP 91 101* 90 86 86   Lipid Profile: Recent Labs    03/20/17 0246  CHOL 208*  HDL 37*  LDLCALC 135*  TRIG 181*  CHOLHDL 5.6   Thyroid Function Tests: No results for input(s): TSH, T4TOTAL, FREET4, T3FREE, THYROIDAB in the last 72 hours. Anemia Panel: No results for input(s): VITAMINB12, FOLATE, FERRITIN,  TIBC, IRON, RETICCTPCT in the last 72 hours. Urine analysis:    Component Value Date/Time   COLORURINE YELLOW 04/01/2016 0939   APPEARANCEUR CLEAR 04/01/2016 0939   LABSPEC >=1.030 (A) 04/01/2016 0939   PHURINE 5.5 04/01/2016 0939   GLUCOSEU NEGATIVE 04/01/2016 0939   HGBUR MODERATE (A) 04/01/2016 0939   BILIRUBINUR SMALL (A) 04/01/2016 0939   BILIRUBINUR small 06/21/2012 1041   KETONESUR TRACE (A) 04/01/2016 0939   PROTEINUR >300 (A) 12/25/2015 1028   UROBILINOGEN 0.2 04/01/2016 0939   NITRITE NEGATIVE 04/01/2016 0939   LEUKOCYTESUR NEGATIVE 04/01/2016 0939   Sepsis Labs: @  LABRCNTIP(procalcitonin:4,lacticacidven:4)  ) Recent Results (from the past 240 hour(s))  MRSA PCR Screening     Status: None   Collection Time: 03/20/17 11:59 PM  Result Value Ref Range Status   MRSA by PCR NEGATIVE NEGATIVE Final    Comment:        The GeneXpert MRSA Assay (FDA approved for NASAL specimens only), is one component of a comprehensive MRSA colonization surveillance program. It is not intended to diagnose MRSA infection nor to guide or monitor treatment for MRSA infections.          Radiology Studies: Ct Head Wo Contrast  Result Date: 03/19/2017 CLINICAL DATA:  72 year old male for follow-up of subdural hematoma. Unchanged altered mental status and encephalopathy. EXAM: CT HEAD WITHOUT CONTRAST TECHNIQUE: Contiguous axial images were obtained from the base of the skull through the vertex without intravenous contrast. COMPARISON:  03/12/2017 MR, 03/10/2017 CT and prior studies FINDINGS: Brain: The right posterior fossa subdural collection is no longer visualized. A new 1 cm hypodensity within the left cerebellum (image 7 -8) likely represents new ischemic changes/infarct or edema. Atrophy, chronic small-vessel white matter ischemic changes and remote bilateral basal ganglia and thalamic infarcts again noted. No new hemorrhage or extra-axial collection noted. Vascular: Atherosclerotic  calcifications noted Skull: No acute abnormality Sinuses/Orbits: No acute abnormality Other: None IMPRESSION: 1. New 1 cm left cerebellar hypodensity which may represent ischemic changes/infarct/edema. No hemorrhage. 2. Interval resolution of right posterior fossa subdural collection. No evidence of new hemorrhage or extra-axial collection. 3. Atrophy, chronic small-vessel white matter ischemic changes and remote basal ganglia/thalamic infarcts. Electronically Signed   By: Margarette Canada M.D.   On: 03/19/2017 15:32   [name] Contrast  Result Date: 03/20/2017 CLINICAL DATA:  Initial evaluation for acute altered mental status, possible stroke. EXAM: MRI HEAD WITHOUT CONTRAST MRA HEAD WITHOUT CONTRAST TECHNIQUE: Multiplanar, multiecho pulse sequences of the brain and surrounding structures were obtained without intravenous contrast. Angiographic images of the head were obtained using MRA technique without contrast. COMPARISON:  Prior CT from 03/19/2017 as well as recent MRI from 03/12/2017. FINDINGS: MRI HEAD FINDINGS Brain: Advanced parenchymal volume loss. Chronic microvascular ischemic disease with scatter remote lacunar infarcts involving the bilateral basal ganglia and thalami and as well as the pons, stable from previous. 1 cm acute ischemic nonhemorrhagic infarct present within the mid left cerebellar hemisphere, corresponding to previously noted hypodensity seen on prior CT (series 3, image 9). No associated mass effect. For no other evidence for acute or subacute ischemia. Gray-white matter differentiation otherwise maintained. No acute intracranial hemorrhage. Multiple remote chronic micro hemorrhages again noted, consistent with chronic hypertensive encephalopathy. No mass lesion, midline shift or mass effect. Ventricular prominence related to global parenchymal volume loss without hydrocephalus. Previously noted subdural collection at the right posterior fossa is nearly resolved now measuring up to 2 mm  in maximal thickness without significant mass effect. No other new extra-axial collection. Major dural sinuses are grossly patent. Vascular: Major intracranial vascular flow voids maintained at the skullbase. Skull and upper cervical spine: Craniocervical junction normal. Bone marrow signal intensity within normal limits. No scalp soft tissue abnormality. Sinuses/Orbits: Globes orbital soft tissues within normal limits. Paranasal sinuses are largely clear. No significant mastoid effusion. Inner ear structures normal. Other: None. MRA HEAD FINDINGS ANTERIOR CIRCULATION: Distal cervical segments of the internal carotid arteries are patent with antegrade flow. Petrous, cavernous, and supraclinoid segments patent bilaterally without flow-limiting stenosis. Mild atheromatous irregularity within the carotid siphons. A1 segments, anterior communicating artery common  anterior cerebral arteries widely patent. Atheromatous irregularity throughout the M1 segments and distal MCA branches without high-grade stenosis, slightly worse on the right. POSTERIOR CIRCULATION: Visualized vertebral arteries patent to the vertebrobasilar junction without stenosis. Partially visualized posterior inferior cerebral artery is patent. Basilar patent to its distal aspect without stenosis. Superior cerebral arteries and posterior cerebral artery is patent bilaterally without flow-limiting stenosis. Distal small vessel atheromatous irregularity within the PCA branches bilaterally. No aneurysm. IMPRESSION: MRI HEAD IMPRESSION: 1. 1 cm acute ischemic nonhemorrhagic infarct positioned at the central left cerebellar hemisphere. 2. Near interval resolution of previously seen right posterior fossa subdural collection, now measuring up to 2 mm in thickness without mass effect. 3. A advanced parenchymal volume loss with chronic micro vessel ischemic disease in scatter remote lacunar infarcts as above. 4. Multiple chronic micro hemorrhages throughout the  brain, consistent with underlying chronic hypertension. MRA HEAD IMPRESSION: Intracranial atherosclerosis as above without large or proximal arterial branch occlusion. No high-grade or correctable stenosis within the intracranial circulation. Electronically Signed   By: Jeannine Boga M.D.   On: 03/20/2017 03:07   Mr Jodene Nam Head Wo Contrast  Result Date: 03/20/2017 CLINICAL DATA:  Initial evaluation for acute altered mental status, possible stroke. EXAM: MRI HEAD WITHOUT CONTRAST MRA HEAD WITHOUT CONTRAST TECHNIQUE: Multiplanar, multiecho pulse sequences of the brain and surrounding structures were obtained without intravenous contrast. Angiographic images of the head were obtained using MRA technique without contrast. COMPARISON:  Prior CT from 03/19/2017 as well as recent MRI from 03/12/2017. FINDINGS: MRI HEAD FINDINGS Brain: Advanced parenchymal volume loss. Chronic microvascular ischemic disease with scatter remote lacunar infarcts involving the bilateral basal ganglia and thalami and as well as the pons, stable from previous. 1 cm acute ischemic nonhemorrhagic infarct present within the mid left cerebellar hemisphere, corresponding to previously noted hypodensity seen on prior CT (series 3, image 9). No associated mass effect. For no other evidence for acute or subacute ischemia. Gray-white matter differentiation otherwise maintained. No acute intracranial hemorrhage. Multiple remote chronic micro hemorrhages again noted, consistent with chronic hypertensive encephalopathy. No mass lesion, midline shift or mass effect. Ventricular prominence related to global parenchymal volume loss without hydrocephalus. Previously noted subdural collection at the right posterior fossa is nearly resolved now measuring up to 2 mm in maximal thickness without significant mass effect. No other new extra-axial collection. Major dural sinuses are grossly patent. Vascular: Major intracranial vascular flow voids maintained at  the skullbase. Skull and upper cervical spine: Craniocervical junction normal. Bone marrow signal intensity within normal limits. No scalp soft tissue abnormality. Sinuses/Orbits: Globes orbital soft tissues within normal limits. Paranasal sinuses are largely clear. No significant mastoid effusion. Inner ear structures normal. Other: None. MRA HEAD FINDINGS ANTERIOR CIRCULATION: Distal cervical segments of the internal carotid arteries are patent with antegrade flow. Petrous, cavernous, and supraclinoid segments patent bilaterally without flow-limiting stenosis. Mild atheromatous irregularity within the carotid siphons. A1 segments, anterior communicating artery common anterior cerebral arteries widely patent. Atheromatous irregularity throughout the M1 segments and distal MCA branches without high-grade stenosis, slightly worse on the right. POSTERIOR CIRCULATION: Visualized vertebral arteries patent to the vertebrobasilar junction without stenosis. Partially visualized posterior inferior cerebral artery is patent. Basilar patent to its distal aspect without stenosis. Superior cerebral arteries and posterior cerebral artery is patent bilaterally without flow-limiting stenosis. Distal small vessel atheromatous irregularity within the PCA branches bilaterally. No aneurysm. IMPRESSION: MRI HEAD IMPRESSION: 1. 1 cm acute ischemic nonhemorrhagic infarct positioned at the central left cerebellar hemisphere. 2. Near interval  resolution of previously seen right posterior fossa subdural collection, now measuring up to 2 mm in thickness without mass effect. 3. A advanced parenchymal volume loss with chronic micro vessel ischemic disease in scatter remote lacunar infarcts as above. 4. Multiple chronic micro hemorrhages throughout the brain, consistent with underlying chronic hypertension. MRA HEAD IMPRESSION: Intracranial atherosclerosis as above without large or proximal arterial branch occlusion. No high-grade or  correctable stenosis within the intracranial circulation. Electronically Signed   By: Jeannine Boga M.D.   On: 03/20/2017 03:07        Scheduled Meds: .  stroke: mapping our early stages of recovery book   Does not apply Once  . aspirin  300 mg Rectal Daily   Or  . aspirin  325 mg Oral Daily  . atorvastatin  80 mg Oral QHS  . carvedilol  3.125 mg Oral BID WC  . dronabinol  2.5 mg Oral QAC lunch  . famotidine  10 mg Oral Daily  . insulin aspart  0-9 Units Subcutaneous Q4H   Continuous Infusions:    LOS: 2 days    Time spent: 25 minutes    Edwin Dada, MD [facility] 03/21/2017, 8:04 AM     Pager [phone] --- please page though AMION:  [email or web address] If 7PM-7AM, please contact night-coverage

## 2017-03-21 NOTE — Evaluation (Signed)
Physical Therapy Evaluation Patient Details Name: [name] MRN: [id number] DOB: [birth year] Today's Date: 03/21/2017   History of Present Illness  Pt is a 72 y.o. male admitted from home on 03/19/17 with worsening confusion; MRI/MRA shows acute ischemic nonhemorrhagic infact at left cerbellar hemisphere, R posterior fossa subdural collection, and multiple chronic mic hemorrhages throughout brain. Pt recently admitted with SDH on 02/01/17, d/c to SNF and re-admitted 03/10/2017 for acute encephalopathy, then d/c home. PMH includes CVA, TIA, CKD III, DM, HTN, HLD, CAD, PMH, AVM, prostate CA. sleep apnea.    Clinical Impression  Pt presents with decreased awareness, slowed processing, and an overall decrease in functional mobility secondary to above. PTA, pt mod indep with mobility using RW, requiring occasional assist from wife for ADLs. Today, pt required min-maxA for bed mobility and standing; pt appears to have functional strength, but decreased ability to follow commands limiting today's evaluation. Pt able to answer yes/no questions appropriately; unsure how much pt's baseline stubbornness is potentially affecting his command following and what is true cognitive deficit post-CVA. Pt would benefit from continued acute PT services to maximize functional mobility and independence prior to d/c with CIR-level therapies in order to return to mod indep PLOF.     Follow Up Recommendations CIR;Supervision/Assistance - 24 hour    Equipment Recommendations  (TBD)    Recommendations for Other Services OT consult;Rehab consult     Precautions / Restrictions Precautions Precautions: Fall Restrictions Weight Bearing Restrictions: No      Mobility  Bed Mobility Overal bed mobility: Needs Assistance Bed Mobility: Supine to Sit;Sit to Supine     Supine to sit: Mod assist Sit to supine: Min assist   General bed mobility comments: Pt not following commands consistently, able to initiate moving BLEs  to EOB, requiring modA to scoot hips and assist trunk elevation. Supervision for sitting. MinA to assist RLE back into bed, pt moving better for this as fatigued and motivated to lay down  Transfers Overall transfer level: Needs assistance Equipment used: None Transfers: Sit to/from Stand Sit to Stand: Min assist;Mod assist;Max assist         General transfer comment: ModA to intiate standing x3, but pt able to assist only requiring minA for trunk elevation. Uncontrolled descend to EOB requiring maxA to keep pt from sitting  Ambulation/Gait             General Gait Details: Deferred secondary to safety and decreased ability to follow commands  Stairs            Wheelchair Mobility    Modified Rankin (Stroke Patients Only)       Balance Overall balance assessment: Needs assistance Sitting-balance support: No upper extremity supported;Feet supported Sitting balance-Leahy Scale: Fair                                       Pertinent Vitals/Pain Pain Assessment: No/denies pain Faces Pain Scale: No hurt Pain Intervention(s): Monitored during session    Home Living Family/patient expects to be discharged to:: Skilled nursing facility Living Arrangements: Spouse/significant other Available Help at Discharge: Family;Available PRN/intermittently Type of Home: House Home Access: Stairs to enter Entrance Stairs-Rails: None Entrance Stairs-Number of Steps: 2 Home Layout: One level Home Equipment: Cane - single point;Shower seat;Walker - 2 wheels;Walker - 4 wheels;Wheelchair - manual Additional Comments: wife works during the day     Prior Function Level of Independence:  Needs assistance   Gait / Transfers Assistance Needed: Mod indep for ambulation with RW; catches tranport from SCAT to HD while wife works  ADL's / Hydrologist Assistance Needed: Pt indep with ADLs; wife reports she makes sure he takes complete shower        Hand Dominance         Extremity/Trunk Assessment   Upper Extremity Assessment Upper Extremity Assessment: Generalized weakness;Difficult to assess due to impaired cognition RUE Deficits / Details: Squeezed fingers to command, but not performing AROM to command after this; functionally appears 3/5 with mobility LUE Deficits / Details: Squeezed fingers to command, but not performing AROM to command after this; functionally appears 3/5 with mobility    Lower Extremity Assessment Lower Extremity Assessment: Generalized weakness;Difficult to assess due to impaired cognition RLE Deficits / Details: Moved ankles to command, but not performing AROM to command beyond this; functionally appears at least 3/5 with mobility LLE Deficits / Details: Moved ankles to command, but not performing AROM to command beyond this; functionally appears at least 3/5 with mobility       Communication   Communication: Receptive difficulties;Expressive difficulties  Cognition Arousal/Alertness: Awake/alert Behavior During Therapy: Flat affect Overall Cognitive Status: Impaired/Different from baseline Area of Impairment: Orientation;Attention;Following commands;Awareness;Problem solving                   Current Attention Level: Focused   Following Commands: Follows one step commands inconsistently   Awareness: Intellectual Problem Solving: Slow processing;Decreased initiation;Difficulty sequencing;Requires verbal cues;Requires tactile cues General Comments: Pt initially answering yes/no question, and able to state name; beyond this, not answering questions beyond some yes/no (still inconsistent with this). Follows one step commands <25% of session; able to squeeze fingers immediately, lifted RLE ~30 sec after being asked.       General Comments General comments (skin integrity, edema, etc.): Wife present during session; max education on pt's current cognitive status, as wife inpatient when pt being asked questions stating  "you have to answer her... don't be lazy..." etc.    Exercises     Assessment/Plan    PT Assessment Patient needs continued PT services  PT Problem List Decreased strength;Decreased activity tolerance;Decreased balance;Decreased mobility;Decreased coordination;Decreased knowledge of use of DME;Decreased safety awareness;Decreased knowledge of precautions;Decreased cognition       PT Treatment Interventions DME instruction;Gait training;Functional mobility training;Therapeutic activities;Therapeutic exercise;Balance training;Patient/family education;Cognitive remediation;Neuromuscular re-education    PT Goals (Current goals can be found in the Care Plan section)  Acute Rehab PT Goals Patient Stated Goal: Return home and to independence PT Goal Formulation: With family Time For Goal Achievement: 04/04/17 Potential to Achieve Goals: Fair    Frequency Min 4X/week   Barriers to discharge        Co-evaluation               AM-PAC PT "6 Clicks" Daily Activity  Outcome Measure Difficulty turning over in bed (including adjusting bedclothes, sheets and blankets)?: Unable Difficulty moving from lying on back to sitting on the side of the bed? : Unable Difficulty sitting down on and standing up from a chair with arms (e.g., wheelchair, bedside commode, etc,.)?: Unable Help needed moving to and from a bed to chair (including a wheelchair)?: A Lot Help needed walking in hospital room?: A Lot Help needed climbing 3-5 steps with a railing? : A Lot 6 Click Score: 9    End of Session Equipment Utilized During Treatment: Gait belt Activity Tolerance: Patient limited by fatigue;Other (comment) Patient left:  in bed;with call [place]/phone within reach;with nursing/sitter in room;with family/visitor present Nurse Communication: Mobility status PT Visit Diagnosis: Other abnormalities of gait and mobility (R26.89);Other symptoms and signs involving the nervous system (R29.898)    Time:  9201-0071 PT Time Calculation (min) (ACUTE ONLY): 25 min   Charges:   PT Evaluation $PT Eval Moderate Complexity: 1 Mod PT Treatments $Therapeutic Activity: 8-22 mins   PT G Codes:       Mabeline Caras, PT, DPT Acute Rehab Services  Pager: [phone] 03/21/2017, 1:53 PM

## 2017-03-21 NOTE — Progress Notes (Signed)
Georgie RN attempted NIH, pt refused to cooperate

## 2017-03-21 NOTE — Progress Notes (Signed)
Pt wakes to voice, pt will squeeze my hands and move feet and legs. Pt will only say "no" to me. ECHO at bedside.

## 2017-03-21 NOTE — Progress Notes (Signed)
  Echocardiogram 2D Echocardiogram has been performed.  [name] 03/21/2017, 10:10 AM

## 2017-03-22 ENCOUNTER — Inpatient Hospital Stay (HOSPITAL_COMMUNITY): Payer: Medicare Other

## 2017-03-22 DIAGNOSIS — Z515 Encounter for palliative care: Secondary | ICD-10-CM

## 2017-03-22 DIAGNOSIS — Z7189 Other specified counseling: Secondary | ICD-10-CM

## 2017-03-22 DIAGNOSIS — R4182 Altered mental status, unspecified: Secondary | ICD-10-CM

## 2017-03-22 LAB — CBC
HCT: 40.9 % (ref 39.0–52.0)
Hemoglobin: 13.6 g/dL (ref 13.0–17.0)
MCH: 31.2 pg (ref 26.0–34.0)
MCHC: 33.3 g/dL (ref 30.0–36.0)
MCV: 93.8 fL (ref 78.0–100.0)
PLATELETS: 224 10*3/uL (ref 150–400)
RBC: 4.36 MIL/uL (ref 4.22–5.81)
RDW: 18.4 % — ABNORMAL HIGH (ref 11.5–15.5)
WBC: 13.9 10*3/uL — ABNORMAL HIGH (ref 4.0–10.5)

## 2017-03-22 LAB — COMPREHENSIVE METABOLIC PANEL
ALT: 8 U/L — ABNORMAL LOW (ref 17–63)
ANION GAP: 16 — AB (ref 5–15)
AST: 12 U/L — ABNORMAL LOW (ref 15–41)
Albumin: 3.8 g/dL (ref 3.5–5.0)
Alkaline Phosphatase: 97 U/L (ref 38–126)
BUN: 46 mg/dL — ABNORMAL HIGH (ref 6–20)
CALCIUM: 9.5 mg/dL (ref 8.9–10.3)
CHLORIDE: 96 mmol/L — AB (ref 101–111)
CO2: 23 mmol/L (ref 22–32)
Creatinine, Ser: 10.93 mg/dL — ABNORMAL HIGH (ref 0.61–1.24)
GFR, EST AFRICAN AMERICAN: 5 mL/min — AB (ref >60)
GFR, EST NON AFRICAN AMERICAN: 4 mL/min — AB (ref >60)
Glucose, Bld: 162 mg/dL — ABNORMAL HIGH (ref 65–99)
Potassium: 5.2 mmol/L — ABNORMAL HIGH (ref 3.5–5.1)
SODIUM: 135 mmol/L (ref 135–145)
Total Bilirubin: 1.2 mg/dL (ref 0.3–1.2)
Total Protein: 8.5 g/dL — ABNORMAL HIGH (ref 6.5–8.1)

## 2017-03-22 LAB — GLUCOSE, CAPILLARY
GLUCOSE-CAPILLARY: 149 mg/dL — AB (ref 65–99)
GLUCOSE-CAPILLARY: 164 mg/dL — AB (ref 65–99)
GLUCOSE-CAPILLARY: 135 mg/dL — AB (ref 65–99)
GLUCOSE-CAPILLARY: 120 mg/dL — AB (ref 65–99)
Glucose-Capillary: 75 mg/dL (ref 65–99)
Glucose-Capillary: 103 mg/dL — ABNORMAL HIGH (ref 65–99)

## 2017-03-22 LAB — LACTIC ACID, PLASMA: LACTIC ACID, VENOUS: 1.6 mmol/L (ref 0.5–1.9)

## 2017-03-22 MED ORDER — VANCOMYCIN HCL 500 MG IV SOLR
500.0000 mg | INTRAVENOUS | Status: DC
  Filled 2017-03-22: qty 500

## 2017-03-22 MED ORDER — VANCOMYCIN HCL 10 G IV SOLR
1250.0000 mg | Freq: Once | INTRAVENOUS | Status: AC
  Administered 2017-03-22: 1250 mg via INTRAVENOUS
  Filled 2017-03-22: qty 1250

## 2017-03-22 MED ORDER — SODIUM CHLORIDE 0.9 % IV SOLN
INTRAVENOUS | Status: DC
  Administered 2017-03-22: 50 mL via INTRAVENOUS
  Administered 2017-03-23: 11:00:00 via INTRAVENOUS

## 2017-03-22 MED ORDER — PIPERACILLIN-TAZOBACTAM 3.375 G IVPB
3.3750 g | Freq: Two times a day (BID) | INTRAVENOUS | Status: DC
  Administered 2017-03-22 – 2017-03-23 (×3): 3.375 g via INTRAVENOUS
  Filled 2017-03-22 (×5): qty 50

## 2017-03-22 NOTE — Progress Notes (Signed)
Pt refused meds and cooperate, attempted to do neuro check and NIH, pt will not speak to me. Will continued to monitor pt.

## 2017-03-22 NOTE — Progress Notes (Signed)
PROGRESS NOTE    [name]  XTG:[id number] DOB: May 04, 1945 DOA: 03/19/2017 PCP: Mackie Pai, PA-C      Brief Narrative:  [name] is a 72 yo M with ESRD on MWF HD, HTN, DM and recent traumatic subdural hematoma, not evacuated who presents with incidental finding on head CT.      Assessment & Plan:  Principal Problem:   Acute CVA (cerebrovascular accident) ([place]) Active Problems:   Anemia of renal disease   ESRD (end stage renal disease) ([place])   Acute encephalopathy   DM (diabetes mellitus), type 2 with renal complications ([name])   Stroke Carotid doppler shows <40%. Echo shows improved EF, still 40% only.  No embolic source.  EEG unremarkable.  Patient refusing PT, SLP.  I discussed briefly with wife yesterday.  Stroke work up and medical optimization at this point complete, pivoting to rehab goals, but patient is refusing, seems to be declining. -Continue daily aspirin 325 mg -Continue statin -Continue Coreg -Consult to Palliative Care    Encephalopathy Delirium Failure to thrive He was admitted from 12/25 to 12/27 here at [facility] for agitation, and MRI brain, B12, ammonia, TSH, HIV, RPR were all normal.  He was treated empirically for seizures with Depakote per Neurology and his symptoms improved.   Currently, he seems to be persistently encephalopathic.  Perhaps this is delirium, or perhaps chronic encephalopathy from his SDH.  I do not think willful?  Seizures have been postulated, but current Neurologists appear to be agnostic on this issue.  Now, we need to arrange rehab post-discharge, but patient is refusing all attempts at rehab.   -IV fluids, low rate given ESRD -Consult Palliative Care to establish goals of care, potentially pursue Hospice  Tachycardia Not clear cause.  Dehydration?  No response to fluids yesterday, but low volume.  No overt signs of infection.  ESRD -Consult to Nephrology for routine HD  Chronic systolic  CHF CAD Hypertension -Continue statin -Continue full dose aspirin  Diabetes This is diet controlled per notes. -SSI q4hrs  Anemia of renal disease Stable around baseline ~12.  Possible seizures As above  Recent subdural hematoma  Other medications -Continue venlafaxine and ranitidine and dronabinol      Core measures: -VTE prophylaxis ordered at time of admission -Aspirin ordered at admission -Atrial fibrillation: not known, none on tele -tPA not given because of outside stroke window -Dysphagia screen ordered in ER -Lipids ordered -PT eval ordered -Non-smoker     DVT prophylaxis: SCDs Code Status: FULL Family Communication: None present Disposition Plan: PT eval, stroke work up, likely home in 1-2 days.   Consultants:   Neurology  Nephrology  Palliative Care  Procedures:   MRI brain  MRA head  Carotid US  Echocardiogram     Subjective: Lying in bed, wakes to touch.  Says, "get the hell off me" only.  Otherwise closes eyes and ignores me.  Per nursing, no new fever, weakness, vomiting.  Refuses NIH, PT.  Objective: Vitals:   03/21/17 1927 03/21/17 2317 03/22/17 0408 03/22/17 0747  BP: 127/76 112/72 129/70 118/71  Pulse: (!) 116 (!) 114 (!) 114 (!) 113  Resp: (!) 22 18 (!) 33 (!) 22  Temp: 100.2 F (37.9 C) 98.2 F (36.8 C) 99.2 F (37.3 C) 98.5 F (36.9 C)  TempSrc: Oral Axillary Axillary Oral  SpO2: 97% 97% 97% 97%  Weight:      Height:       No intake or output data in the 24 hours  ending 03/22/17 0846 Filed Weights   03/19/17 1745 03/20/17 2359  Weight: 54.4 kg (120 lb) 52.7 kg (116 lb 2.9 oz)    Examination: General appearance: Elderly adult male, awake lying in bed, alert to me, refuses exam.    Data Reviewed: I have personally reviewed following labs and imaging studies:  CBC: Recent Labs  Lab 03/19/17 1759 03/19/17 1825 03/20/17 0246  WBC 4.1  --  5.3  NEUTROABS 2.3  --   --   HGB 12.5* 13.9 11.9*  HCT  37.9* 41.0 37.3*  MCV 93.8  --  92.6  PLT 197  --  983   Basic Metabolic Panel: Recent Labs  Lab 03/19/17 1759 03/19/17 1825 03/20/17 0246 03/20/17 2300  NA 141 141 138  --   K 4.9 4.7 4.0  --   CL 100* 99* 99*  --   CO2 28  --  26  --   GLUCOSE 199* 195* 102*  --   BUN 31* 33* 36*  --   CREATININE 9.91* 9.60* 10.59*  --   CALCIUM 9.4  --  9.2  --   PHOS  --   --   --  1.6*   GFR: Estimated Creatinine Clearance: 4.8 mL/min (A) (by C-G formula based on SCr of 10.59 mg/dL (H)). Liver Function Tests: Recent Labs  Lab 03/19/17 1759 03/20/17 0246  AST 12* 7*  ALT 10* 8*  ALKPHOS 90 89  BILITOT 0.6 0.8  PROT 7.8 7.4  ALBUMIN 3.7 3.7   No results for input(s): LIPASE, AMYLASE in the last 168 hours. Recent Labs  Lab 03/20/17 0246  AMMONIA 23   Coagulation Profile: Recent Labs  Lab 03/19/17 1759  INR 1.09   Cardiac Enzymes: No results for input(s): CKTOTAL, CKMB, CKMBINDEX, TROPONINI in the last 168 hours. BNP (last 3 results) Recent Labs    07/09/16 1438 07/31/16 1104 08/19/16 1155  PROBNP 1,044.0* 343.0* 349.0*   HbA1C: Recent Labs    03/20/17 0246  HGBA1C 5.3   CBG: Recent Labs  Lab 03/21/17 1627 03/21/17 1930 03/21/17 2328 03/22/17 0412 03/22/17 0802  GLUCAP 123* 159* 106* 149* 164*   Lipid Profile: Recent Labs    03/20/17 0246  CHOL 208*  HDL 37*  LDLCALC 135*  TRIG 181*  CHOLHDL 5.6   Thyroid Function Tests: No results for input(s): TSH, T4TOTAL, FREET4, T3FREE, THYROIDAB in the last 72 hours. Anemia Panel: No results for input(s): VITAMINB12, FOLATE, FERRITIN, TIBC, IRON, RETICCTPCT in the last 72 hours. Urine analysis:    Component Value Date/Time   COLORURINE YELLOW 04/01/2016 [place] 04/01/2016 0939   LABSPEC >=1.030 (A) 04/01/2016 0939   PHURINE 5.5 04/01/2016 0939   GLUCOSEU NEGATIVE 04/01/2016 0939   HGBUR MODERATE (A) 04/01/2016 0939   BILIRUBINUR SMALL (A) 04/01/2016 0939   BILIRUBINUR small  06/21/2012 1041   KETONESUR TRACE (A) 04/01/2016 0939   PROTEINUR >300 (A) 12/25/2015 1028   UROBILINOGEN 0.2 04/01/2016 0939   NITRITE NEGATIVE 04/01/2016 0939   LEUKOCYTESUR NEGATIVE 04/01/2016 0939   Sepsis Labs: @LABRCNTIP (procalcitonin:4,lacticacidven:4)  ) Recent Results (from the past 240 hour(s))  MRSA PCR Screening     Status: None   Collection Time: 03/20/17 11:59 PM  Result Value Ref Range Status   MRSA by PCR NEGATIVE NEGATIVE Final    Comment:        The GeneXpert MRSA Assay (FDA approved for NASAL specimens only), is one component of a comprehensive MRSA colonization surveillance program. It is  not intended to diagnose MRSA infection nor to guide or monitor treatment for MRSA infections.          Radiology Studies: No results found.      Scheduled Meds: .  stroke: mapping our early stages of recovery book   Does not apply Once  . aspirin  300 mg Rectal Daily   Or  . aspirin  325 mg Oral Daily  . atorvastatin  80 mg Oral QHS  . carvedilol  3.125 mg Oral BID WC  . dronabinol  2.5 mg Oral QAC lunch  . famotidine  10 mg Oral Daily  . insulin aspart  0-9 Units Subcutaneous Q4H   Continuous Infusions: . sodium chloride       LOS: 3 days    Time spent: 25 minutes    Edwin Dada, MD [facility] 03/22/2017, 8:46 AM     Pager [phone] --- please page though AMION:  [email or web address] If 7PM-7AM, please contact night-coverage

## 2017-03-22 NOTE — Progress Notes (Signed)
Update: Met at bedside with wife, daughter.  Patient appears to be deteriorating. They question whether he is willfully refusing care, would do better in another setting.  While this may be true, I question what that setting would be.  He does not participate in any cares, refuses meds, refuses food.  On my observation today, when sitting up with his wife, he required full assist just to sit at EOB and was drooling, listless.    BMP this afternoon, showed so severe electrolyte derangements, CBC showed only mild leukocytosis. Lactate normal.  CXR clear.  No localizing signs of infection, but will empirically start antibiotics given tachycardia and leukocytosis for now.  If cultures remain negative and he shows no improvement within 24 hours, I would recommend withdrawing this.  I have advised family that it appears he is failing to thrive, from his ESRD, CHF and recent SDH.  I suspect at this time, his life expectancy is less than 6 months.  If he continues without eating, his life is considerably shorter, probably on the order of days to weeks.  I have asked palliative care to evaluate the patient, and assist family with goal setting.

## 2017-03-22 NOTE — Consult Note (Signed)
Consultation Note Date: 03/22/2017   Patient Name: [name]  DOB: March 07, 1946  MRN: [id number]  Age / Sex: 72 y.o., male  PCP: [name] Referring Physician: Edwin Hill, *  Reason for Consultation: Establishing goals of care  HPI/Patient Profile: 73 y.o. male  with past medical history of ESRD on dialysis, dementia, HTN, recent fall with facial fractures in November, recent hospitalization forh subdural hematoma, recent admission for encephalopathy, now admitted on 03/19/2017 with altered mental status and incidental finding on CT of cerebellar infarct.  During this admission patient was initially combative and agitated, requiring lorazepam. He is now lethargic, nonverbal, refusing medication, not able to eat or participate in therapies. Palliative medicine consulted for [place] discussion with family.     Clinical Assessment and Goals of Care: I have reviewed medical records including EPIC notes, labs and imaging, assessed the patient and then met at the bedside along with patient's spouse- [name], daughterUlis Hill, and son- [name], to discuss diagnosis prognosis, GOC, EOL wishes, disposition and options.  I introduced Palliative Medicine as specialized medical care for people living with serious illness. It focuses on providing relief from the symptoms and stress of a serious illness. The goal is to improve quality of life for both the patient and the family.  We discussed a brief life review of the patient. He and his wife used to enjoy riding horses. They did this for 30 years. Emry was always the life of the party, enjoyed being independent.  As far as functional and nutritional status - he has declined significantly over the last several months- especially since his fall in November. They have noted declines in his ability to care for himself, requiring increased assistance in bathing and  dressing. He was last able to ambulate on his own a few weeks ago. He has lost a significant amount of weight- chart review shows he is down to 116lbs from 134 llbs in July- this is greater than 10% weight loss in six months unintentionally.  His cognition has also declined. They noted his memory started declining in October. Today he is nonverbal, not taking po nutrition or medication, and not participating in therapies.    We discussed their current illness and what it means in the larger context of their on-going co-morbidities.  Natural disease trajectory and expectations at EOL were discussed. I discussed concerns that patient is at end of life process as evidenced by significant decline in three areas of cognition, nutrition, and function. Discussed although there is no pinpoint illness process defined- patient is showing signs of failure to thrive and dying process.  The difference between aggressive medical intervention and comfort care was explained.   Advanced directives, concepts specific to code status, artifical feeding and hydration, continuing dialysis and rehospitalization were considered and discussed.  Hospice and Palliative Care services outpatient were explained and offered.  Questions and concerns were addressed.  Hard Choices booklet left for review. The family was encouraged to call with questions or concerns.   Family was  understandably very emotional as they are just now processing that patient is nearing end of life. They did not make any decisions today regarding patient's GOC or advance healthcare directives.   Primary Decision Maker NEXT OF KIN patient's spouse- [name]    SUMMARY OF RECOMMENDATIONS - Continue current scope of care for now -PMT plans to meet again with family tomorrow to continue [place] discussion and advance care planning    Code Status/Advance Care Planning:  Full code  Additional Recommendations (Limitations, Scope, Preferences):  Full  Scope Treatment  Prognosis:    Unable to determine  Discharge Planning: To Be Determined  Primary Diagnoses: Present on Admission: . Acute encephalopathy . Acute CVA (cerebrovascular accident) ([place]) . ESRD (end stage renal disease) ([place]) . Anemia of renal disease . DM (diabetes mellitus), type 2 with renal complications ([place])   I have reviewed the medical record, interviewed the patient and family, and examined the patient. The following aspects are pertinent.  Past Medical History:  Diagnosis Date  . 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 Anemia, iron deficiency 03/24/2011   a. Recurrent GI bleed, tx with periodic iron infusions.  . 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 AVM (arteriovenous malformation) of duodenum, acquired    egds in 01/2012, 04/2011  . BPH (benign prostatic hyperplasia)   . CAD (coronary artery disease)    a. Cath 09/2012: moderate borderline CAD in mid LAD/small diagonal branch, mild RCA stenosis, to be managed medically   . Chronic lower back pain   . Diabetic peripheral neuropathy ([place]) 2014   foot pain.  . Essential hypertension 02/01/2012  . Fatty liver    on CT of 11/2010  . GERD (gastroesophageal reflux disease)   . GI bleed    a. Recurrent GI bleed, tx with IV iron. b. Per heme notes - likely AVMs 04/2012 (tx with cauterization several months ago).  . Hematuria    a. Urology note scan from 07/2012: cystoscopy without evidence for bladder lesion, only lateral hypertrophy of posterior urethra, bladder impression from BPH. b. Pt states he had "some tests" scheduled for later in July 2014.  . High cholesterol   . Hyperlipidemia 02/01/2012  . Hypertension   . Hypertensive urgency 07/06/2016  . 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 Orthostatic hypotension    a. Tx with florinef.  . Peripheral neuropathy   . Polyp, colonic    Colonoscopy 01/2012 "benign" polyp  . Prostate cancer ([place])   . Sleep apnea    "had mask; couldn't  sleep in it" (09/20/2012)  . Smoker   . SOB (shortness of breath) 07/06/2016  . Stage III chronic kidney disease ([name])    a. Stage 3 (DM with complications ->CKD, peripheral neuropathy).  . Stroke ([place])    x 2  . Symptomatic anemia 04/03/2015  . Tinea pedis 05/24/2012  . Tobacco use 02/01/2012  . Type 2 diabetes, uncontrolled, with neuropathy ([place]) 02/01/2012  . Type II diabetes mellitus ([place])    a. Dx 1994, uncontrolled.    Social History   Socioeconomic History  . Marital status: Married    Spouse name: None  . Number of children: 2  . Years of education: None  . Highest education level: None  Social Needs  . Financial resource strain: None  . Food insecurity - worry: None  . Food insecurity - inability: None  . Transportation needs - medical: None  . Transportation needs - non-medical: None  Occupational History  . Occupation: taken out  of work due to back  Tobacco Use  . Smoking status: Current Every Day Smoker    Packs/day: 0.50    Years: 30.00    Pack years: 15.00    Types: Cigarettes    Start date: 04/15/1968  . Smokeless tobacco: Never Used  . Tobacco comment: start back a couple of months ago  Substance and Sexual Activity  . Alcohol use: No    Alcohol/week: 0.0 oz    Comment: 09/20/2012 "Used to; stopped ~ 2009; never had problem w/it"  . Drug use: No  . Sexual activity: No  Other Topics Concern  . None  Social History Narrative   Regular exercise: rides horses   Caffeine use: occasionally   Family History  Problem Relation Age of Onset  . Stroke Father        Died at 70  . Diabetes Mother   . Heart attack Brother        Died at 12  . Diabetes Sister   . Stomach cancer Brother   . Heart attack Sister    Scheduled Meds: .  stroke: mapping our early stages of recovery book   Does not apply Once  . aspirin  300 mg Rectal Daily   Or  . aspirin  325 mg Oral Daily  . atorvastatin  80 mg Oral QHS  . carvedilol  3.125 mg Oral BID WC  . dronabinol  2.5 mg  Oral QAC lunch  . famotidine  10 mg Oral Daily  . insulin aspart  0-9 Units Subcutaneous Q4H   Continuous Infusions: . sodium chloride 50 mL (03/22/17 1008)  . piperacillin-tazobactam (ZOSYN)  IV    . vancomycin 1,250 mg (03/22/17 1512)  . [START ON 03/23/2017] vancomycin     PRN Meds:.acetaminophen **OR** acetaminophen (TYLENOL) oral liquid 160 mg/5 mL **OR** acetaminophen Medications Prior to Admission:  Prior to Admission medications   Medication Sig Start Date End Date Taking? Authorizing Provider  [employer] OP Instill 1-2 drops into both eyes two to three times a day as needed for dry eyes   Yes [provider]  atorvastatin (LIPITOR) 80 MG tablet Take 80 mg by mouth at bedtime.   Yes [provider]  carvedilol (COREG) 3.125 MG tablet Take 3.125 mg by mouth 2 (two) times daily with a meal.   Yes [provider]  dronabinol (MARINOL) 2.5 MG capsule Take 2.5 mg by mouth once a day 03/06/17  Yes [provider]  multivitamin (RENA-VIT) TABS tablet Take 1 tablet by mouth every Monday, Wednesday, and Friday.    Yes [provider]  ranitidine (ZANTAC) 150 MG capsule Take 1 capsule (150 mg total) by mouth 2 (two) times daily. 01/06/17  Yes Saguier, Percell Miller, PA-C  venlafaxine (EFFEXOR) 37.5 MG tablet Take 37.5 mg by mouth every morning.   Yes [provider]   No Known Allergies Review of Systems  Unable to perform ROS: Mental status change    Physical Exam  Constitutional:  cachetic  Neurological:  Nonverbal, does not answer questions  Skin: Skin is warm and dry.  Nursing note and vitals reviewed.   Vital Signs: BP 111/70 (BP Location: Right Arm)   Pulse (!) 115   Temp 98.3 F (36.8 C) (Oral)   Resp 19   Ht _0  (1.702 m)   Wt 52.7 kg (116 lb 2.9 oz)   SpO2 97%   BMI 18.20 kg/m  Pain Assessment: No/denies pain   Pain Score: Asleep  SpO2: SpO2: 97 % O2 Device:SpO2: 97 % O2 Flow Rate: .   IO:  Intake/output summary:   Intake/Output Summary (Last 24 hours) at 03/22/2017 1550 Last data filed at 03/22/2017 1500 Gross per 24 hour  Intake 250 ml  Output -  Net 250 ml    LBM:   Baseline Weight: Weight: 54.4 kg (120 lb) Most recent weight: Weight: 52.7 kg (116 lb 2.9 oz)     Palliative Assessment/Data: PPS: 10%     Thank you for this consult. Palliative medicine will continue to follow and assist as needed.   Time In: 1315 Time Out: 1445 Time Total: 90 minutes Prolonged services billed: yes Greater than 50%  of this time was spent counseling and coordinating care related to the above assessment and plan.  Signed by: Mariana Kaufman, AGNP-C Palliative Medicine    Please contact Palliative Medicine Team phone at [phone] for questions and concerns.  For individual provider: See Shea Evans

## 2017-03-22 NOTE — Progress Notes (Signed)
Rehab Admissions Coordinator Note:  Patient was screened by Retta Diones for appropriateness for an Inpatient Acute Rehab Consult.  At this time, we are recommending Inpatient Rehab consult.  Retta Diones 03/22/2017, 9:10 AM  I can be reached at [phone].

## 2017-03-22 NOTE — Progress Notes (Signed)
SLP Cancellation Note  Patient Details Name: [name] MRN: [id number] DOB: [birth year]   Cancelled treatment:       Reason Eval/Treat Not Completed: Patient declined, no reason specified. Pt not responding to commands, refuses all PO. Spoke with RN; to page if pt requesting PO.   Deneise Lever, [place], [place] Speech-Language Pathologist [place] 03/22/2017, 8:32 AM

## 2017-03-22 NOTE — Progress Notes (Signed)
Received from ICU; oriented patient and his wife to room and unit routine; fall safety reviewed; bed alarm on and bed is low.

## 2017-03-22 NOTE — Progress Notes (Addendum)
Admit: 03/19/2017 LOS: 3  89M ESRD MWF [facility] with acute CVA  Subjective:  No interval changes Wife at bedside, no questions Outpt HD unit didn't send over records  No intake/output data recorded.  Filed Weights   03/19/17 1745 03/20/17 2359  Weight: 54.4 kg (120 lb) 52.7 kg (116 lb 2.9 oz)    Scheduled Meds: .  stroke: mapping our early stages of recovery book   Does not apply Once  . aspirin  300 mg Rectal Daily   Or  . aspirin  325 mg Oral Daily  . atorvastatin  80 mg Oral QHS  . carvedilol  3.125 mg Oral BID WC  . dronabinol  2.5 mg Oral QAC lunch  . famotidine  10 mg Oral Daily  . insulin aspart  0-9 Units Subcutaneous Q4H   Continuous Infusions: . sodium chloride     PRN Meds:.acetaminophen **OR** acetaminophen (TYLENOL) oral liquid 160 mg/5 mL **OR** acetaminophen  Current Labs: reviewed    Physical Exam:  Blood pressure 118/71, pulse (!) 113, temperature 98.5 F (36.9 C), temperature source Oral, resp. rate (!) 22, height 5\' 7"  (1.702 m), weight 52.7 kg (116 lb 2.9 oz), SpO2 97 %. Awakens to stimulus Tachy, regular CTAB Trace LEE +B/T of LFA AVF  A 1. ESRD MWF [facility] 2. New Small Cerebellar ischemic CVA 3. AMS with Dementia 4. Progressive decline/FTT 5. Persistent tachycardia 6. Traumatic SDH 01/2017 seen at [facility] 7. Chronic sCHF 8. CAD 9. HTN, stable 10. Anemia, stable 11. CKD BMD: P was 1.6 post HD I believe, follow, not on binders  12. ? Seizures  P 1. Cont HD on MWF schedule 2. Agree with palliative involvement   Pearson Grippe MD 03/22/2017, 8:49 AM  Recent Labs  Lab 03/19/17 1759 03/19/17 1825 03/20/17 0246 03/20/17 2300  NA 141 141 138  --   K 4.9 4.7 4.0  --   CL 100* 99* 99*  --   CO2 28  --  26  --   GLUCOSE 199* 195* 102*  --   BUN 31* 33* 36*  --   CREATININE 9.91* 9.60* 10.59*  --   CALCIUM 9.4  --  9.2  --   PHOS  --   --   --  1.6*   Recent Labs  Lab 03/19/17 1759 03/19/17 1825 03/20/17 0246  WBC 4.1  --  5.3   NEUTROABS 2.3  --   --   HGB 12.5* 13.9 11.9*  HCT 37.9* 41.0 37.3*  MCV 93.8  --  92.6  PLT 197  --  219

## 2017-03-22 NOTE — Evaluation (Addendum)
Occupational Therapy Evaluation Patient Details Name: [name] MRN: [id number] DOB: Sep 06, 1945 Today's Date: 03/22/2017    History of Present Illness Pt is a 72 y.o. male admitted from home on 03/19/17 with worsening confusion; MRI/MRA shows acute ischemic nonhemorrhagic infact at left cerbellar hemisphere, R posterior fossa subdural collection, and multiple chronic mic hemorrhages throughout brain. Pt recently admitted with SDH on 02/01/17, d/c to SNF and re-admitted 03/10/2017 for acute encephalopathy, then d/c home. PMH includes CVA, TIA, CKD III, DM, HTN, HLD, CAD, PMH, AVM, prostate CA. sleep apnea.   Clinical Impression   Per chart, pt was able to ambulate with RW and complete basic ADL with modified independence PTA. On OT evaluation, pt initially lethargic but opening eyes to make eye contact with therapist. He was able to follow one command to squeeze L fingers but otherwise did not follow commands this session. He would close his eyes at times when asked to complete a functional task. Pt currently requiring total assistance for all ADL and mobility and attempted to assist pt to sit at EOB; however, pt becoming agitated and cursing at therapist. Feel pt would benefit from intensive rehabilitation post-acute D/C at CIR level once able to increase occupational participation. OT will continue to follow while admitted.    Follow Up Recommendations  Supervision/Assistance - 24 hour;CIR    Equipment Recommendations  Other (comment)(defer to next venue )    Recommendations for Other Services       Precautions / Restrictions Precautions Precautions: Fall Restrictions Weight Bearing Restrictions: No      Mobility Bed Mobility Overal bed mobility: Needs Assistance             General bed mobility comments: Pt did not follow commands to initiate bed mobility attempted to assist in rolling and supine to sit but pt unwiling to participate and cursing at therapist.   Transfers                  General transfer comment: Unable to test    Balance                                           ADL either performed or assessed with clinical judgement   ADL Overall ADL's : Needs assistance/impaired                                       General ADL Comments: Currently requires total assist overall for ADL. Attempted to assist pt to sit at EOB with total assist with pt becoming agitated and cursing.      Vision   Vision Assessment?: Vision impaired- to be further tested in functional context Additional Comments: Noted L gaze preference. Need to continue to assess.      Perception     Praxis      Pertinent Vitals/Pain Pain Assessment: Faces Faces Pain Scale: No hurt Pain Intervention(s): Monitored during session     Hand Dominance     Extremity/Trunk Assessment Upper Extremity Assessment Upper Extremity Assessment: Generalized weakness;RUE deficits/detail;LUE deficits/detail RUE Deficits / Details: Not moving R UE on command or functionally this session.  LUE Deficits / Details: Squeezed fingers to command and assisted with elbow flexion but no other movement on commands.    Lower Extremity Assessment Lower Extremity Assessment: Defer  to PT evaluation       Communication Communication Communication: Receptive difficulties;Expressive difficulties   Cognition Arousal/Alertness: Lethargic Behavior During Therapy: Flat affect Overall Cognitive Status: Impaired/Different from baseline Area of Impairment: Orientation;Attention;Following commands;Awareness;Problem solving                 Orientation Level: (difficult to assess as pt not speaking) Current Attention Level: Focused   Following Commands: Follows one step commands inconsistently   Awareness: (pre-intellectual) Problem Solving: Slow processing;Decreased initiation;Difficulty sequencing;Requires verbal cues;Requires tactile cues General Comments:  Pt opening eyes and looking briefly at therapist. Only verbalized once during session when therapist attempting to assist to EOB and pt cursed and said "no."   General Comments  Family present at end of session. Pt did have hiccups at beginning of sesesion.     Exercises     Shoulder Instructions      Home Living Family/patient expects to be discharged to:: Skilled nursing facility Living Arrangements: Spouse/significant other Available Help at Discharge: Family;Available PRN/intermittently Type of Home: House Home Access: Stairs to enter [employer] of Steps: 2 Entrance Stairs-Rails: None Home Layout: One level     Bathroom Shower/Tub: Teacher, early years/pre: Standard     Home Equipment: Cane - single point;Shower seat;Walker - 2 wheels;Walker - 4 wheels;Wheelchair - manual   Additional Comments: wife works during the day       Prior Functioning/Environment Level of Independence: Needs assistance  Gait / Transfers Assistance Needed: Mod indep for ambulation with RW; catches tranport from SCAT to HD while wife works ADL's / Hydrologist Assistance Needed: Pt indep with ADLs; wife reports she makes sure he takes complete shower   Comments: All information above from previous admission. Pt verbalizing only once during session.         OT Problem List: Decreased strength;Decreased range of motion;Decreased activity tolerance;Impaired balance (sitting and/or standing);Impaired vision/perception;Decreased coordination;Decreased cognition;Decreased safety awareness;Decreased knowledge of use of DME or AE;Decreased knowledge of precautions;Impaired UE functional use      OT Treatment/Interventions: Self-care/ADL training;Therapeutic exercise;Energy conservation;DME and/or AE instruction;Patient/family education;Balance training;Neuromuscular education;Visual/perceptual remediation/compensation;Cognitive remediation/compensation;Therapeutic activities    OT  Goals(Current goals can be found in the care plan section) Acute Rehab OT Goals Patient Stated Goal: Per family to increase participation OT Goal Formulation: With patient Time For Goal Achievement: 04/05/17 Potential to Achieve Goals: Good ADL Goals Pt Will Perform Grooming: with mod assist;bed level Additional ADL Goal #1: Pt will complete bed mobility to sit at EOB for ADL participation with overall mod assist. Additional ADL Goal #2: Pt will follow 2/3 one step commands during grooming tasks. Additional ADL Goal #3: Pt will sustain attention to ADL task with no more than 1 VC in a non-distracting environment.  OT Frequency: Min 2X/week   Barriers to D/C:            Co-evaluation              AM-PAC PT "6 Clicks" Daily Activity     Outcome Measure Help from another person eating meals?: Total Help from another person taking care of personal grooming?: Total Help from another person toileting, which includes using toliet, bedpan, or urinal?: Total Help from another person bathing (including washing, rinsing, drying)?: Total Help from another person to put on and taking off regular upper body clothing?: Total Help from another person to put on and taking off regular lower body clothing?: Total 6 Click Score: 6   End of Session    Activity  Tolerance: Treatment limited secondary to agitation Patient left: in bed;with call [place]/phone within reach;with family/visitor present  OT Visit Diagnosis: Other abnormalities of gait and mobility (R26.89);Muscle weakness (generalized) (M62.81);Other symptoms and signs involving cognitive function                Time: 1400-1414 OT Time Calculation (min): 14 min Charges:  OT General Charges $OT Visit: 1 Visit OT Evaluation $OT Eval Moderate Complexity: 1 Mod G-Codes:     Norman Herrlich, MS OTR/L  Pager: [place] A Johna Kearl 03/22/2017, 3:36 PM

## 2017-03-22 NOTE — Progress Notes (Signed)
Transferred to 3west room19 by bed, stable, report given to RN. Belongings with wife.

## 2017-03-22 NOTE — Progress Notes (Signed)
Pt. Is refusing to take am meds. MD aware.

## 2017-03-23 ENCOUNTER — Ambulatory Visit (HOSPITAL_COMMUNITY): Payer: Medicare Other

## 2017-03-23 LAB — VAS US CAROTID
LCCADDIAS: -11 cm/s
LCCAPDIAS: -12 cm/s
LCCAPSYS: -74 cm/s
LEFT ECA DIAS: -8 cm/s
LEFT VERTEBRAL DIAS: -13 cm/s
LICADDIAS: -15 cm/s
LICADSYS: -79 cm/s
LICAPSYS: 68 cm/s
Left CCA dist sys: -64 cm/s
Left ICA prox dias: 11 cm/s
RCCAPSYS: -80 cm/s
RIGHT ECA DIAS: -7 cm/s
RIGHT VERTEBRAL DIAS: 14 cm/s
Right CCA prox dias: -13 cm/s
Right cca dist sys: -76 cm/s

## 2017-03-23 LAB — GLUCOSE, CAPILLARY
GLUCOSE-CAPILLARY: 102 mg/dL — AB (ref 65–99)
GLUCOSE-CAPILLARY: 144 mg/dL — AB (ref 65–99)
GLUCOSE-CAPILLARY: 206 mg/dL — AB (ref 65–99)
Glucose-Capillary: 186 mg/dL — ABNORMAL HIGH (ref 65–99)

## 2017-03-23 LAB — BASIC METABOLIC PANEL
Anion gap: 23 — ABNORMAL HIGH (ref 5–15)
BUN: 59 mg/dL — AB (ref 6–20)
CHLORIDE: 96 mmol/L — AB (ref 101–111)
CO2: 20 mmol/L — ABNORMAL LOW (ref 22–32)
CREATININE: 12.79 mg/dL — AB (ref 0.61–1.24)
Calcium: 9.1 mg/dL (ref 8.9–10.3)
GFR calc Af Amer: 4 mL/min — ABNORMAL LOW (ref >60)
GFR, EST NON AFRICAN AMERICAN: 3 mL/min — AB (ref >60)
GLUCOSE: 139 mg/dL — AB (ref 65–99)
Potassium: 5.1 mmol/L (ref 3.5–5.1)
SODIUM: 139 mmol/L (ref 135–145)

## 2017-03-23 LAB — CBC
HCT: 39.9 % (ref 39.0–52.0)
Hemoglobin: 12.9 g/dL — ABNORMAL LOW (ref 13.0–17.0)
MCH: 30.5 pg (ref 26.0–34.0)
MCHC: 32.3 g/dL (ref 30.0–36.0)
MCV: 94.3 fL (ref 78.0–100.0)
PLATELETS: 221 10*3/uL (ref 150–400)
RBC: 4.23 MIL/uL (ref 4.22–5.81)
RDW: 18.8 % — ABNORMAL HIGH (ref 11.5–15.5)
WBC: 16.7 10*3/uL — ABNORMAL HIGH (ref 4.0–10.5)

## 2017-03-23 MED ORDER — OXYCODONE HCL 20 MG/ML PO CONC: 5.0000 mg | ORAL | Status: DC | PRN

## 2017-03-23 MED ORDER — GLYCOPYRROLATE 1 MG PO TABS
1.0000 mg | ORAL_TABLET | ORAL | Status: DC | PRN
  Filled 2017-03-23: qty 1

## 2017-03-23 MED ORDER — SODIUM CHLORIDE 0.9% FLUSH: 3.0000 mL | INTRAVENOUS | Status: DC | PRN

## 2017-03-23 MED ORDER — BIOTENE DRY MOUTH MT LIQD: 15.0000 mL | OROMUCOSAL | Status: DC | PRN

## 2017-03-23 MED ORDER — HALOPERIDOL LACTATE 2 MG/ML PO CONC
0.5000 mg | ORAL | Status: DC | PRN
  Filled 2017-03-23: qty 0.3

## 2017-03-23 MED ORDER — LORAZEPAM 0.5 MG PO TABS: 1.0000 mg | ORAL_TABLET | ORAL | Status: DC | PRN

## 2017-03-23 MED ORDER — ATORVASTATIN CALCIUM 80 MG PO TABS
80.0000 mg | ORAL_TABLET | Freq: Every day | ORAL | Status: DC
  Administered 2017-03-28 – 2017-04-02 (×6): 80 mg via ORAL
  Filled 2017-03-23 (×5): qty 1

## 2017-03-23 MED ORDER — GLYCOPYRROLATE 0.2 MG/ML IJ SOLN: 0.2000 mg | INTRAMUSCULAR | Status: DC | PRN

## 2017-03-23 MED ORDER — SODIUM CHLORIDE 0.9 % IV SOLN
INTRAVENOUS | Status: DC
  Administered 2017-03-23: 21:00:00 via INTRAVENOUS

## 2017-03-23 MED ORDER — POLYVINYL ALCOHOL 1.4 % OP SOLN
1.0000 [drp] | Freq: Four times a day (QID) | OPHTHALMIC | Status: DC | PRN
  Administered 2017-03-29: 1 [drp] via OPHTHALMIC
  Filled 2017-03-23: qty 15

## 2017-03-23 MED ORDER — LORAZEPAM 2 MG/ML IJ SOLN: 1.0000 mg | INTRAMUSCULAR | Status: DC | PRN

## 2017-03-23 MED ORDER — ACETAMINOPHEN 325 MG PO TABS
650.0000 mg | ORAL_TABLET | Freq: Four times a day (QID) | ORAL | Status: DC | PRN
  Administered 2017-03-29 – 2017-04-01 (×4): 650 mg via ORAL
  Filled 2017-03-23 (×4): qty 2

## 2017-03-23 MED ORDER — HALOPERIDOL LACTATE 5 MG/ML IJ SOLN: 0.5000 mg | INTRAMUSCULAR | Status: DC | PRN

## 2017-03-23 MED ORDER — ONDANSETRON 4 MG PO TBDP: 4.0000 mg | ORAL_TABLET | Freq: Four times a day (QID) | ORAL | Status: DC | PRN

## 2017-03-23 MED ORDER — SODIUM CHLORIDE 0.9% FLUSH: 3.0000 mL | Freq: Two times a day (BID) | INTRAVENOUS | Status: DC

## 2017-03-23 MED ORDER — VANCOMYCIN HCL IN DEXTROSE 500-5 MG/100ML-% IV SOLN
INTRAVENOUS | Status: AC
  Administered 2017-03-23: 500 mg
  Filled 2017-03-23: qty 100

## 2017-03-23 MED ORDER — DRONABINOL 2.5 MG PO CAPS
2.5000 mg | ORAL_CAPSULE | Freq: Every day | ORAL | Status: DC
  Administered 2017-03-29 – 2017-04-01 (×3): 2.5 mg via ORAL
  Filled 2017-03-23 (×3): qty 1

## 2017-03-23 MED ORDER — LORAZEPAM 2 MG/ML PO CONC: 1.0000 mg | ORAL | Status: DC | PRN

## 2017-03-23 MED ORDER — ONDANSETRON HCL 4 MG/2ML IJ SOLN: 4.0000 mg | Freq: Four times a day (QID) | INTRAMUSCULAR | Status: DC | PRN

## 2017-03-23 MED ORDER — HALOPERIDOL 0.5 MG PO TABS
0.5000 mg | ORAL_TABLET | ORAL | Status: DC | PRN
  Filled 2017-03-23: qty 1

## 2017-03-23 MED ORDER — ACETAMINOPHEN 650 MG RE SUPP: 650.0000 mg | Freq: Four times a day (QID) | RECTAL | Status: DC | PRN

## 2017-03-23 MED ORDER — SODIUM CHLORIDE 0.9 % IV SOLN: 250.0000 mL | INTRAVENOUS | Status: DC | PRN

## 2017-03-23 MED ORDER — CARVEDILOL 3.125 MG PO TABS
3.1250 mg | ORAL_TABLET | Freq: Two times a day (BID) | ORAL | Status: DC
  Administered 2017-03-28 – 2017-04-02 (×9): 3.125 mg via ORAL
  Filled 2017-03-23 (×8): qty 1

## 2017-03-23 NOTE — Progress Notes (Signed)
Pt transported off unit to dialysis. P. Amo Eulalio Reamy RN 

## 2017-03-23 NOTE — Progress Notes (Signed)
  Speech Language Pathology Treatment: Dysphagia;Cognitive-Linquistic  Patient Details Name: [name] MRN: [id number] DOB: 02-23-1946 Today's Date: 03/23/2017 Time: 6384-5364 SLP Time Calculation (min) (ACUTE ONLY): 15 min  Assessment / Plan / Recommendation Clinical Impression  No change in function or progress achieved with interventions today. Pt opens eyes briefly to name or total assist movement. Turn his head away from cloth to mouth to clean saliva, and from ice chip or cup. SLP placed ice chip between pts lips with total assist to determine if the feel of ice would trigger an appropriate oral response. Pt moved his mouth and bit, but then closed his eyes and did not initiate a swallow. Liquids spilled from left side of mouth. Same repeated with cup sip with same response. Pt not cognitively able to consume PO at this time but also demonstrates signs of severe impairment even if he were actively attempting to swallow. Will continue to follow.    HPI HPI: 72 y.o. male with history of ESRD on hemodialysis Monday with today, hypertension, diabetes mellitus on diet who was recently admitted for acute encephalopathy and placed on Depakote and followed up with his PCP today and had CT head done because patient was found to be increasingly confused last 2 days.  CT head was showing possible left cerebellar stroke and patient was referred to the ER.  Patient recently admitted for acute encephalopathy and cause was not clear.  Had extensive workup done and eventually was discharged on Depakote.  As per the wife who provided history the ER physician patient became more confused last 2 days      SLP Plan  Continue with current plan of care       Recommendations  Diet recommendations: NPO Medication Administration: Via alternative means                Oral Care Recommendations: Oral care QID Follow up Recommendations: Skilled Nursing facility Plan: Continue with current plan of  care       GO                [name], Katherene Ponto 03/23/2017, 9:28 AM

## 2017-03-23 NOTE — Progress Notes (Signed)
Called back to room by POA wife and son.  They have observed improvement in his awareness after dialysis today.  They request to cancel plans for Hospice.  We discussed CODE STATUS again, POA is clear that he is DNR/DNI, son agrees.  They feel his improvement after dialysis was marked and wish to continue dialysis.  They would like to continue comfort feeding, and we reviewed risks of aspiration.  They have inquired about enteral nutrition, and I explained risks of pain, aspiration, also limited evidence of benefit in extending life.  They intuit that nutrition is of utmost importance to his healing, and I cannot disagree.  They do not ask about transfer.    I will discuss with Nephrology tomorrow, for their input into his prognosis.  Discontinue antibiotics, as cultures are negative.  Continue IV fluids.  Diet liberal for now.  Cortrak tomorrow and consult nutrition.  Will plan for CIR pre-admission screening tomorrow and then if not candidate for CIR, SNF for rehab.

## 2017-03-23 NOTE — Telephone Encounter (Signed)
Would someone call them and ask clarification for the order request   1 week 2 2 week 2  1 week 5.  Requesting PT, OT speech social worker aid evaluation.  What is requested duration for each requested service.   Thanks, Let me what once clarified.

## 2017-03-23 NOTE — Care Management Important Message (Signed)
Important Message  Patient Details  Name: [name] MRN: [id number] Date of Birth: June 21, 1945   Medicare Important Message Given:  Yes    Nathen May 03/23/2017, 8:57 AM

## 2017-03-23 NOTE — Progress Notes (Addendum)
Daily Progress Note   Patient Name: [name]       Date: 03/23/2017 DOB: 07/30/1945  Age: 72 y.o. MRN#: [id number] Attending Physician: Edwin Dada, * Primary Care Physician: Elise Benne Admit Date: 03/19/2017  Reason for Consultation/Follow-up: Establishing goals of care  Subjective: Met with patient's spouse- Jalynn Betzold, and daughter Doroteo Glassman along with Dr. Loleta Books for followup of [place] meeting.  Discussed again patient's apparent failure to thrive- not eating or drinking. Not participating in therapies. Significant cognitive changes. He remains nonverbal. After protracted discussion, family agrees that he is experiencing dying process. Spouse expresses significant event last night when pastor visited and prayed and that was only time that patient verbalized.  Guerry Minors stated that if Ovid Curd were able to speak, she feels he would say, "Stop." She feels he would want to stop aggressive medical care and allow for naturally dying process.  Spouse and family express GOC of comfort through dying process. We discussed transition to comfort care and what that looks like. Comfort feeding and risk for aspiration was discussed. Discussed stopping dialysis as this is prolonging dying process and not providing improvement in patient's functional status or improving quality of life.  Discussed code status. Patient would not benefit from resuscitative efforts and this would not align with GOC of comfort and support through natural dying process. Family agrees with Do Not Resuscitate order and understands this means that if patient were to experience death he would not be resuscitated. We discussed patient and family wishes as far as disposition- they do not feel patient would want to die in  a nursing home or hospital. Residential hospice facility vs home hospice was discussed. Family requests residential hospice and has had experience with friends and family members at the [facility].    Review of Systems  Unable to perform ROS: Mental status change    Length of Stay: 4  Current Medications: Scheduled Meds:  .  stroke: mapping our early stages of recovery book   Does not apply Once  . aspirin  300 mg Rectal Daily   Or  . aspirin  325 mg Oral Daily  . atorvastatin  80 mg Oral QHS  . carvedilol  3.125 mg Oral BID WC  . dronabinol  2.5 mg Oral QAC lunch  . famotidine  10 mg Oral Daily  . insulin aspart  0-9 Units Subcutaneous Q4H    Continuous Infusions: . sodium chloride 50 mL/hr at 03/23/17 1041  . piperacillin-tazobactam (ZOSYN)  IV 3.375 g (03/23/17 1051)  . vancomycin      PRN Meds: acetaminophen **OR** acetaminophen (TYLENOL) oral liquid 160 mg/5 mL **OR** acetaminophen  Physical Exam  Constitutional:  Frail, cachexic  HENT:  Left droop, drooling  Cardiovascular: Intact distal pulses.  tachycardic  Pulmonary/Chest: Effort normal.  Neurological:  nonverbal  Psychiatric:  Flat affect  Nursing note and vitals reviewed.           Vital Signs: BP 110/70   Pulse (!) 127   Temp 98.7 F (37.1 C) (Oral)   Resp 18   Ht _0  (1.702 m)   Wt 53.3 kg (117 lb 8.1 oz)   SpO2 94%   BMI 18.40 kg/m  SpO2: SpO2: 94 % O2 Device: O2 Device: Not Delivered O2 Flow Rate:    Intake/output summary:   Intake/Output Summary (Last 24 hours) at 03/23/2017 1510 Last data filed at 03/22/2017 1600 Gross per 24 hour  Intake 50 ml  Output -  Net 50 ml   LBM:   Baseline Weight: Weight: 54.4 kg (120 lb) Most recent weight: Weight: 53.3 kg (117 lb 8.1 oz)       Palliative Assessment/Data: PPS: 10%   Flowsheet Rows     Most Recent Value  Intake Tab  Referral Department  Hospitalist  Unit at Time of Referral  Med/Surg Unit  Palliative Care Primary  Diagnosis  Nephrology  Date Notified  03/22/17  Palliative Care Type  New Palliative care  Reason for referral  Clarify Goals of Care  Date of Admission  03/19/17  Date first seen by Palliative Care  03/22/17  # of days Palliative referral response time  0 Day(s)  # of days IP prior to Palliative referral  3  Clinical Assessment  Psychosocial & Spiritual Assessment  Palliative Care Outcomes      Patient Active Problem List   Diagnosis Date Noted  . Advance care planning   . Goals of care, counseling/discussion   . Palliative care by specialist   . Altered mental status   . Acute CVA (cerebrovascular accident) ([place]) 03/19/2017  . DM (diabetes mellitus), type 2 with renal complications ([place]) 76/73/4193  . Acute encephalopathy 03/10/2017  . Subdural hematoma ([place]) 03/10/2017  . Dilated cardiomyopathy ([place]) 01/07/2017  . Acute respiratory failure with hypoxia ([place]) 12/22/2016  . Acute on chronic combined systolic and diastolic CHF (congestive heart failure) ([place]) 12/22/2016  . HLD (hyperlipidemia) 12/22/2016  . Elevated troponin I level   . Acute blood loss anemia   . Fall 10/22/2016  . GIB (gastrointestinal bleeding) 10/21/2016  . Abnormal echocardiogram 08/06/2016  . Flash pulmonary edema ([place]) 07/06/2016  . SOB (shortness of breath) 07/06/2016  . Hypertensive urgency 07/06/2016  . Anemia in chronic kidney disease, on chronic dialysis ([place])   . Anemia due to chronic kidney disease   . Diabetic retinopathy ([place]) 01/16/2016  . Dysphagia 12/21/2015  . Environmental allergies 12/04/2015  . Allergic rhinitis 10/28/2015  . ESRD (end stage renal disease) ([place]) 10/28/2015  . Stroke ([place]) 04/06/2015  . AVM (arteriovenous malformation) of small bowel, acquired with hemorrhage ([place]) 04/06/2015  . Malnutrition of moderate degree 04/05/2015  . Acute renal failure superimposed on stage 4 chronic kidney disease ([place]) 04/04/2015  . Symptomatic anemia 04/03/2015  . Benign paroxysmal  positional vertigo 09/26/2014  .  Weakness of right lower extremity 02/28/2014  . Metatarsalgia of both feet 09/13/2013  . Equinus deformity of foot, acquired 09/13/2013  . Diabetes mellitus type 2, controlled, with complications ([place]) 00/52/5910  . GERD (gastroesophageal reflux disease) 03/24/2013  . GI bleed 02/28/2013  . Cerebral infarction ([place]) 11/17/2012  . Dizziness and giddiness 10/31/2012  . Prostate cancer ([place]) 10/27/2012  . Diabetic neuropathy ([place]) 10/04/2012  . CAD (coronary artery disease) 10/04/2012  . Hoarseness 06/26/2012  . Hematuria 05/24/2012  . Tinea pedis 05/24/2012  . Back pain 02/23/2012  . Erectile dysfunction 02/03/2012  . Type 2 diabetes, uncontrolled, with neuropathy ([place]) 02/01/2012  . Essential hypertension 02/01/2012  . Tobacco use 02/01/2012  . Hyperlipidemia 02/01/2012  . Anemia of renal disease 05/14/2011    Palliative Care Assessment & Plan   Patient Profile: 72 y.o. male  with past medical history of ESRD on dialysis, dementia, HTN, recent fall with facial fractures in November, recent hospitalization forh subdural hematoma, recent admission for encephalopathy, now admitted on 03/19/2017 with altered mental status and incidental finding on CT of cerebellar infarct.  During this admission patient was initially combative and agitated, requiring lorazepam. He is now lethargic, nonverbal, refusing medication, not able to eat or participate in therapies. Palliative medicine consulted for [place] discussion with family.     Assessment/Recommendations/Plan  DNR  Transition to full comfort care  Social work referral for residential hospice- family requesting [facility]  Stop dialysis after today's session is complete   Goals of Care and Additional Recommendations:  Limitations on Scope of Treatment: Full Comfort Care  Code Status:  DNR  Prognosis:   < 2 weeks d/t failure to thrive, vascular dementia, stopping dialysis, not eating or  drinking  Discharge Planning:  Hospice facility  Care plan was discussed with patient's spouse and daughter.  Thank you for allowing the Palliative Medicine Team to assist in the care of this patient.   Time In: 1400 Time Out: 1540 Total Time 100 mins Prolonged Time Billed no      Greater than 50%  of this time was spent counseling and coordinating care related to the above assessment and plan.  Mariana Kaufman, AGNP-C Palliative Medicine   Please contact Palliative Medicine Team phone at [phone] for questions and concerns.

## 2017-03-23 NOTE — Progress Notes (Signed)
PROGRESS NOTE    [name]  NWG:[id number] DOB: 11-21-1945 DOA: 03/19/2017 PCP: Troy Pai, PA-C      Brief Narrative:  [name] is a 72 yo M with ESRD on MWF HD, HTN, DM and recent traumatic subdural hematoma, not evacuated who presents with incidental finding on head CT.  Failing to thrive.      Assessment & Plan:  Principal Problem:   Acute CVA (cerebrovascular accident) ([place]) Active Problems:   Anemia of renal disease   ESRD (end stage renal disease) ([place])   Acute encephalopathy   DM (diabetes mellitus), type 2 with renal complications [facility])   Advance care planning   Goals of care, counseling/discussion   Palliative care by specialist      Encephalopathy Delirium Failure to thrive See my note from 1/6.  This was not the patient's presenting complaint, but has become the central concern.  The patient has had a decline since mid year last year (even before his SDH, he was declining, had seen Neurology in June and then October, noted to have dementia, starting to need help with dressing, ADLs).    This has obviously accelerated since the SDH, to the point that in most of the notes from his Dec hostpialization here, and his office visit follow up, he was essentially not talking.  Wife relates that to her in that interval, he would still say brief things, like "let's get something to eat", but this appears to be part of an overall pattern of decline, preceding the SDH, but steeper since.  I appreciate Palliative Care being available at short notice yesterday, their input is appreciated.  Agree that the precise illness process is undefined here, but he is showing signs of failing to thrive and the dying process.    -Continue IV fluids -I would recommend, with Palliative support and family input, patient's diet being liberalized and transition to full comfort cares -I will discuss inpatient Hospice with Palliative Care, as I feel that without eating (he has not eaten  since admission) his prognosis is days to weeks   Stroke -Continue daily aspirin, statin, Coreg if he takes them -NPO per SLP  Tachycardia Leukocytosis There are no localizing signs of infection.  If cultures negative at 48 hours, or if family chooses comfort cares, will discontinue antibiotics. -Continue Vanc/Zosyn for now  ESRD -Consult to Nephrology for routine HD  Diabetes This is diet controlled per notes. -SSI q4hrs, low threshold to discontinue  Anemia of renal disease Stable around baseline ~12.  Recent subdural hematoma MRI during this hospitalization showed improvement.  Other medications -Continue venlafaxine and ranitidine and dronabinol if willing/able to take      Core measures: -VTE prophylaxis ordered at time of admission -Aspirin ordered at admission -Atrial fibrillation: not known, none on tele -tPA not given because of outside stroke window -Dysphagia screen ordered in ER -Lipids ordered -PT eval ordered -Non-smoker     DVT prophylaxis: SCDs Code Status: FULL Family Communication: None present today Disposition Plan: Will discuss Hospice with family and Palliative Care today.  Otherwise SNF, likely tomorrow.   Consultants:   Neurology  Nephrology  Palliative Care  Procedures:   MRI brain  MRA head  Carotid US  Echocardiogram     Subjective: Lying in bed, wakes to touch, no vocalizations.  Drooling.  No new fever.  Refusing all oral intake per nursing.    Objective: Vitals:   03/22/17 1845 03/22/17 2119 03/23/17 0230 03/23/17 0937  BP: (!) 142/72  137/69 139/80 (!) 145/84  Pulse: (!) 112 (!) 122 (!) 116 (!) 108  Resp: 18 16    Temp: 98.3 F (36.8 C) 99.8 F (37.7 C) 98.3 F (36.8 C) 98.2 F (36.8 C)  TempSrc:  Axillary Oral Axillary  SpO2: 93% 98% 98% 97%  Weight:      Height:        Intake/Output Summary (Last 24 hours) at 03/23/2017 1122 Last data filed at 03/22/2017 1600 Gross per 24 hour  Intake 250 ml    Output -  Net 250 ml   Filed Weights   03/19/17 1745 03/20/17 2359  Weight: 54.4 kg (120 lb) 52.7 kg (116 lb 2.9 oz)    Examination: General appearance: Elderly adult male, awake lying in bed, opens eyes and makes brief eye contact, then slumps back.   HEENT: Pupils equal.  Drooling.  Dentition poor.  OP moist. Cardiac: Tachycardic, regular, no murmurs. Respiratory: Clear, no rales or wheezes appreciated.   Neuro: Does not follow commands at all.  Limp.  Makes eye contact to voice, then slumps back asleep.  Pupils equal.     Data Reviewed: I have personally reviewed following labs and imaging studies:  CBC: Recent Labs  Lab 03/19/17 1759 03/19/17 1825 03/20/17 0246 03/22/17 1054 03/23/17 0448  WBC 4.1  --  5.3 13.9* 16.7*  NEUTROABS 2.3  --   --   --   --   HGB 12.5* 13.9 11.9* 13.6 12.9*  HCT 37.9* 41.0 37.3* 40.9 39.9  MCV 93.8  --  92.6 93.8 94.3  PLT 197  --  219 224 387   Basic Metabolic Panel: Recent Labs  Lab 03/19/17 1759 03/19/17 1825 03/20/17 0246 03/20/17 2300 03/22/17 1054 03/23/17 0448  NA 141 141 138  --  135 139  K 4.9 4.7 4.0  --  5.2* 5.1  CL 100* 99* 99*  --  96* 96*  CO2 28  --  26  --  23 20*  GLUCOSE 199* 195* 102*  --  162* 139*  BUN 31* 33* 36*  --  46* 59*  CREATININE 9.91* 9.60* 10.59*  --  10.93* 12.79*  CALCIUM 9.4  --  9.2  --  9.5 9.1  PHOS  --   --   --  1.6*  --   --    GFR: Estimated Creatinine Clearance: 4 mL/min (A) (by C-G formula based on SCr of 12.79 mg/dL (H)). Liver Function Tests: Recent Labs  Lab 03/19/17 1759 03/20/17 0246 03/22/17 1054  AST 12* 7* 12*  ALT 10* 8* 8*  ALKPHOS 90 89 97  BILITOT 0.6 0.8 1.2  PROT 7.8 7.4 8.5*  ALBUMIN 3.7 3.7 3.8   No results for input(s): LIPASE, AMYLASE in the last 168 hours. Recent Labs  Lab 03/20/17 0246  AMMONIA 23   Coagulation Profile: Recent Labs  Lab 03/19/17 1759  INR 1.09   Cardiac Enzymes: No results for input(s): CKTOTAL, CKMB, CKMBINDEX, TROPONINI  in the last 168 hours. BNP (last 3 results) Recent Labs    07/09/16 1438 07/31/16 1104 08/19/16 1155  PROBNP 1,044.0* 343.0* 349.0*   HbA1C: No results for input(s): HGBA1C in the last 72 hours. CBG: Recent Labs  Lab 03/22/17 2208 03/22/17 2328 03/23/17 0348 03/23/17 0730 03/23/17 1117  GLUCAP 103* 120* 144* 186* 206*   Lipid Profile: No results for input(s): CHOL, HDL, LDLCALC, TRIG, CHOLHDL, LDLDIRECT in the last 72 hours. Thyroid Function Tests: No results for input(s): TSH, T4TOTAL, FREET4, T3FREE,  THYROIDAB in the last 72 hours. Anemia Panel: No results for input(s): VITAMINB12, FOLATE, FERRITIN, TIBC, IRON, RETICCTPCT in the last 72 hours. Urine analysis:    Component Value Date/Time   COLORURINE YELLOW 04/01/2016 [place] 04/01/2016 0939   LABSPEC >=1.030 (A) 04/01/2016 0939   PHURINE 5.5 04/01/2016 0939   GLUCOSEU NEGATIVE 04/01/2016 0939   HGBUR MODERATE (A) 04/01/2016 0939   BILIRUBINUR SMALL (A) 04/01/2016 0939   BILIRUBINUR small 06/21/2012 1041   KETONESUR TRACE (A) 04/01/2016 0939   PROTEINUR >300 (A) 12/25/2015 1028   UROBILINOGEN 0.2 04/01/2016 0939   NITRITE NEGATIVE 04/01/2016 0939   LEUKOCYTESUR NEGATIVE 04/01/2016 0939   Sepsis Labs: @LABRCNTIP (procalcitonin:4,lacticacidven:4)  ) Recent Results (from the past 240 hour(s))  MRSA PCR Screening     Status: None   Collection Time: 03/20/17 11:59 PM  Result Value Ref Range Status   MRSA by PCR NEGATIVE NEGATIVE Final    Comment:        The GeneXpert MRSA Assay (FDA approved for NASAL specimens only), is one component of a comprehensive MRSA colonization surveillance program. It is not intended to diagnose MRSA infection nor to guide or monitor treatment for MRSA infections.          Radiology Studies: Dg Chest Port 1 View  Result Date: 03/22/2017 CLINICAL DATA:  72 year old male with cough EXAM: PORTABLE CHEST 1 VIEW COMPARISON:  Prior chest x-ray 03/10/2017  FINDINGS: Stable cardiac mediastinal contours. Atherosclerotic calcifications again noted in the transverse aorta. The lungs are clear. Minimal chronic bronchitic changes are stable. No acute osseous abnormality. No pneumothorax or pleural effusion. IMPRESSION: 1. No acute cardiopulmonary process. 2.  Aortic Atherosclerosis (ICD10-170.0) Electronically Signed   By: Jacqulynn Cadet M.D.   On: 03/22/2017 14:34        Scheduled Meds: .  stroke: mapping our early stages of recovery book   Does not apply Once  . aspirin  300 mg Rectal Daily   Or  . aspirin  325 mg Oral Daily  . atorvastatin  80 mg Oral QHS  . carvedilol  3.125 mg Oral BID WC  . dronabinol  2.5 mg Oral QAC lunch  . famotidine  10 mg Oral Daily  . insulin aspart  0-9 Units Subcutaneous Q4H   Continuous Infusions: . sodium chloride 50 mL/hr at 03/23/17 1041  . piperacillin-tazobactam (ZOSYN)  IV 3.375 g (03/23/17 1051)  . vancomycin       LOS: 4 days    Time spent: 25 minutes    Edwin Dada, MD [facility] 03/23/2017, 11:22 AM     Pager [phone] --- please page though AMION:  [email or web address] Password [email or web address] If 7PM-7AM, please contact night-coverage

## 2017-03-23 NOTE — Progress Notes (Signed)
[facility] ROUNDING NOTE   Subjective:   Interval History:  Acute CVA  MWF dialysis [facility]   Refusing medications Objective:  Vital signs in last 24 hours:  Temp:  [98.2 F (36.8 C)-99.8 F (37.7 C)] 98.2 F (36.8 C) (01/07 0937) Pulse Rate:  [108-122] 108 (01/07 0937) Resp:  [16-20] 16 (01/06 2119) BP: (111-145)/(69-84) 145/84 (01/07 0937) SpO2:  [93 %-98 %] 97 % (01/07 0937)  Weight change:  Filed Weights   03/19/17 1745 03/20/17 2359  Weight: 120 lb (54.4 kg) 116 lb 2.9 oz (52.7 kg)    Intake/Output: I/O last 3 completed shifts: In: 300 [I.V.:300] Out: -    Intake/Output this shift:  No intake/output data recorded.  CVS- RRR RS- CTA ABD- BS present soft non-distended EXT- no edema  LFA AVF    Basic Metabolic Panel: Recent Labs  Lab 03/19/17 1759 03/19/17 1825 03/20/17 0246 03/20/17 2300 03/22/17 1054 03/23/17 0448  NA 141 141 138  --  135 139  K 4.9 4.7 4.0  --  5.2* 5.1  CL 100* 99* 99*  --  96* 96*  CO2 28  --  26  --  23 20*  GLUCOSE 199* 195* 102*  --  162* 139*  BUN 31* 33* 36*  --  46* 59*  CREATININE 9.91* 9.60* 10.59*  --  10.93* 12.79*  CALCIUM 9.4  --  9.2  --  9.5 9.1  PHOS  --   --   --  1.6*  --   --     Liver Function Tests: Recent Labs  Lab 03/19/17 1759 03/20/17 0246 03/22/17 1054  AST 12* 7* 12*  ALT 10* 8* 8*  ALKPHOS 90 89 97  BILITOT 0.6 0.8 1.2  PROT 7.8 7.4 8.5*  ALBUMIN 3.7 3.7 3.8   No results for input(s): LIPASE, AMYLASE in the last 168 hours. Recent Labs  Lab 03/20/17 0246  AMMONIA 23    CBC: Recent Labs  Lab 03/19/17 1759 03/19/17 1825 03/20/17 0246 03/22/17 1054 03/23/17 0448  WBC 4.1  --  5.3 13.9* 16.7*  NEUTROABS 2.3  --   --   --   --   HGB 12.5* 13.9 11.9* 13.6 12.9*  HCT 37.9* 41.0 37.3* 40.9 39.9  MCV 93.8  --  92.6 93.8 94.3  PLT 197  --  219 224 221    Cardiac Enzymes: No results for input(s): CKTOTAL, CKMB, CKMBINDEX, TROPONINI in the last 168  hours.  BNP: Invalid input(s): POCBNP  CBG: Recent Labs  Lab 03/22/17 1605 03/22/17 2208 03/22/17 2328 03/23/17 0348 03/23/17 0730  GLUCAP 75 103* 120* 144* 186*    Microbiology: Results for orders placed or performed during the hospital encounter of 03/19/17  MRSA PCR Screening     Status: None   Collection Time: 03/20/17 11:59 PM  Result Value Ref Range Status   MRSA by PCR NEGATIVE NEGATIVE Final    Comment:        The GeneXpert MRSA Assay (FDA approved for NASAL specimens only), is one component of a comprehensive MRSA colonization surveillance program. It is not intended to diagnose MRSA infection nor to guide or monitor treatment for MRSA infections.     Coagulation Studies: No results for input(s): LABPROT, INR in the last 72 hours.  Urinalysis: No results for input(s): COLORURINE, LABSPEC, PHURINE, GLUCOSEU, HGBUR, BILIRUBINUR, KETONESUR, PROTEINUR, UROBILINOGEN, NITRITE, LEUKOCYTESUR in the last 72 hours.  Invalid input(s): APPERANCEUR    Imaging: Dg Chest [facility] 1 [address]  Result Date: 03/22/2017 CLINICAL DATA:  72 year old male with cough EXAM: PORTABLE CHEST 1 VIEW COMPARISON:  Prior chest x-ray 03/10/2017 FINDINGS: Stable cardiac mediastinal contours. Atherosclerotic calcifications again noted in the transverse aorta. The lungs are clear. Minimal chronic bronchitic changes are stable. No acute osseous abnormality. No pneumothorax or pleural effusion. IMPRESSION: 1. No acute cardiopulmonary process. 2.  Aortic Atherosclerosis (ICD10-170.0) Electronically Signed   By: Jacqulynn Cadet M.D.   On: 03/22/2017 14:34     Medications:   . sodium chloride 50 mL (03/22/17 1008)  . piperacillin-tazobactam (ZOSYN)  IV Stopped (03/23/17 0300)  . vancomycin     .  stroke: mapping our early stages of recovery book   Does not apply Once  . aspirin  300 mg Rectal Daily   Or  . aspirin  325 mg Oral Daily  . atorvastatin  80 mg Oral QHS  . carvedilol  3.125 mg Oral  BID WC  . dronabinol  2.5 mg Oral QAC lunch  . famotidine  10 mg Oral Daily  . insulin aspart  0-9 Units Subcutaneous Q4H   acetaminophen **OR** acetaminophen (TYLENOL) oral liquid 160 mg/5 mL **OR** acetaminophen  Assessment/ Plan:  1. ESRD MWF [facility] 2. New Small Cerebellar ischemic CVA 3. AMS with Dementia 4. Progressive decline/FTT 5. Persistent tachycardia 6. Traumatic SDH 01/2017 seen at [facility] 7. Chronic sCHF 8. CAD 9. HTN, stable 10. Anemia, stable 11. CKD BMD: P was 1.6 post HD I believe, follow, not on binders  12. ? Seizures       LOS: 4 Parnika Tweten W @TODAY @10 :15 AM

## 2017-03-23 NOTE — Consult Note (Signed)
   [facility] CM Inpatient Consult   03/23/2017  [name] May 29, 1945 [id number]  Chart reviewed and reveals the patient has been re-admitted in the Medicare ACO.  Patient is currently off the unit to dialysis. Will follow up at a more appropriate time.  Natividad Brood, RN BSN [facility] Liaison  [phone] business mobile phone Toll free office [phone]

## 2017-03-23 NOTE — Progress Notes (Signed)
PT Cancellation Note  Patient Details Name: [name] MRN: [id number] DOB: 11-07-1945   Cancelled Treatment:    Reason Eval/Treat Not Completed: (P) Patient at procedure or test/unavailable Pt at HD. PT will follow back later this afternoon as able.   Marvelous Woolford B. Migdalia Dk PT, DPT Acute Rehabilitation  [phone] Pager [phone]  [place] 03/23/2017, 1:39 PM

## 2017-03-23 NOTE — Progress Notes (Signed)
Pt back to room from dialysis; VSS: Pt spouse and son at bedside requesting for information on transferring pt to a different hospital as well as they want pt to be full code and not DNR. Dr. Loleta Books called and notified; MD at bedside to see pt. Will closely monitor pt. Delia Heady RN

## 2017-03-24 ENCOUNTER — Other Ambulatory Visit: Payer: Self-pay

## 2017-03-24 DIAGNOSIS — D62 Acute posthemorrhagic anemia: Secondary | ICD-10-CM

## 2017-03-24 DIAGNOSIS — D72829 Elevated white blood cell count, unspecified: Secondary | ICD-10-CM

## 2017-03-24 DIAGNOSIS — K219 Gastro-esophageal reflux disease without esophagitis: Secondary | ICD-10-CM

## 2017-03-24 DIAGNOSIS — E785 Hyperlipidemia, unspecified: Secondary | ICD-10-CM

## 2017-03-24 DIAGNOSIS — N400 Enlarged prostate without lower urinary tract symptoms: Secondary | ICD-10-CM

## 2017-03-24 DIAGNOSIS — I2510 Atherosclerotic heart disease of native coronary artery without angina pectoris: Secondary | ICD-10-CM

## 2017-03-24 DIAGNOSIS — I639 Cerebral infarction, unspecified: Secondary | ICD-10-CM

## 2017-03-24 DIAGNOSIS — Z720 Tobacco use: Secondary | ICD-10-CM

## 2017-03-24 DIAGNOSIS — Z8546 Personal history of malignant neoplasm of prostate: Secondary | ICD-10-CM

## 2017-03-24 DIAGNOSIS — D631 Anemia in chronic kidney disease: Secondary | ICD-10-CM

## 2017-03-24 DIAGNOSIS — E1122 Type 2 diabetes mellitus with diabetic chronic kidney disease: Secondary | ICD-10-CM

## 2017-03-24 DIAGNOSIS — N186 End stage renal disease: Secondary | ICD-10-CM

## 2017-03-24 LAB — GLUCOSE, CAPILLARY
Glucose-Capillary: 205 mg/dL — ABNORMAL HIGH (ref 65–99)
Glucose-Capillary: 143 mg/dL — ABNORMAL HIGH (ref 65–99)

## 2017-03-24 MED ORDER — ENSURE ENLIVE PO LIQD
237.0000 mL | Freq: Two times a day (BID) | ORAL | Status: DC
  Administered 2017-03-28 – 2017-04-02 (×7): 237 mL via ORAL

## 2017-03-24 MED ORDER — SODIUM CHLORIDE 0.9 % IV SOLN
INTRAVENOUS | Status: DC
  Administered 2017-03-24: 15:00:00 via INTRAVENOUS
  Administered 2017-03-29: 50 mL/h via INTRAVENOUS
  Administered 2017-03-30: 07:00:00 via INTRAVENOUS

## 2017-03-24 MED ORDER — SODIUM CHLORIDE 0.9 % IV SOLN
INTRAVENOUS | Status: DC
  Administered 2017-03-24 – 2017-04-02 (×7): via INTRAVENOUS

## 2017-03-24 NOTE — Progress Notes (Signed)
Pt's wife wants to speak with MD and Education officer, museum. She doesn't understand why her husbands condition is getting worse. Also wanted his CBG checked. CBG= 205. MD notified. Will continue to monitor.

## 2017-03-24 NOTE — Progress Notes (Signed)
Attempted to put in NG tube. Pt was fighting it. Son at bedside said that was enough. He didn't want to continue to try to get it down. MD notified. Will continue to monitor.

## 2017-03-24 NOTE — Progress Notes (Signed)
CSW had been following for discharge plan. Yesterday, CSW received alert from PMT that patient would be residential hospice appropriate and to send referral to [place]; CSW sent referral yesterday for follow up today.   CSW noting plans today that family has changed their mind; CSW updated [facility] admissions, who had already spoken with the family. CSW noting per MD notes that patient will not be able to handle sitting in a chair for outpatient dialysis and family would like an NG tube placed; patient would not be SNF appropriate at this time. Per [facility] notes, patient is being referred for [facility].  CSW signing off.  Laveda Abbe, [place] Clinical Social Worker [phone]

## 2017-03-24 NOTE — Progress Notes (Signed)
Rehab admissions - Please see rehab consult recommending SNF.  I spoke with case manager who is looking into LTACH as well.  He cannot tolerate the intensity of acute inpatient rehab at this time.  A slower paced program would better suit patient at this point.  Call me for questions.  #[phone]

## 2017-03-24 NOTE — Progress Notes (Addendum)
PROGRESS NOTE    [name]  UUV:[id number] DOB: [birth year] DOA: 03/19/2017 PCP: Mackie Pai, PA-C      Brief Narrative:  [name] is a 72 yo M with ESRD on MWF HD, HTN, DM and recent traumatic subdural hematoma, not evacuated who presents with stroke.  Now has failure to thrive.  Hospice referral was recommended, family/POA have requested continued aggressive care.    NOTE: At present as of 6:07 on 03/24/2017, the Hospice referral has been cancelled.  I do not believe family have spoken with Palliative Care today, but they are hoping to continue aggressive care.    At present, my assumption is that patient will need LTAC placement to continue dialysis (he cannot sit in chair unsupported), per POA wishes.  SW is exploring [facility] at [facility] or [facility].  [facility].  At present neither have offered beds.     We attempted NG tube placement (as patient has not eated since admission), but patient appeared to be fighting it, and family asked to abort.  I have requested IR consult for PEG tube placement.  Palliative Care has been clear that this is not what patient would have wanted, but family wish to explore.  Wife/POA has not decided if she would give consent, but wants to know if patient is candidate for placement.  Son is a strong advocate for patient's continued medical care, including feeding tube.  If SW finds placement at [facility], discharge to [facility].  Inpatient Hospice (in [facility]) remains an alternative.  Family also did inquire about transfer to [facility], since his recent SDH was there.  Because his SDH is resolved and he has no surgical issue, he cannot go to NSG or Trauma service there.  I spoke with hospitalist Dr. Dwyane Dee who felt our work up here was thorough, treatment options were appropriate, he declined to accept.     Assessment & Plan:  Principal Problem:   Acute CVA (cerebrovascular accident) ([place]) Active Problems:   Anemia of renal disease   ESRD (end stage  renal disease) ([place])   Acute encephalopathy   DM (diabetes mellitus), type 2 with renal complications [facility])   Advance care planning   Goals of care, counseling/discussion   Palliative care by specialist   Leukocytosis   Tobacco abuse   History of prostate cancer   Benign prostatic hyperplasia      Encephalopathy Delirium Failure to thrive See my note from 1/6.  This was not the patient's presenting complaint, but has become the central concern.  The patient has had a decline since mid year last year (even before his SDH, he was declining, had seen Neurology in June and then October, noted to have dementia, starting to need help with dressing, ADLs).    This has obviously accelerated since the SDH, to the point that in most of the notes from his Dec hostpialization here, and his office visit follow up, he was essentially not talking.  Wife relates that to her (in that interval), he would still say brief things, like "let's get something to eat", but this appears to be part of an overall pattern of decline that precedes the SDH --> but has clearly accelerated after.  I appreciate Palliative Care being available at short notice this week and providing recommendations, their input is appreciated.  His precise illness process is undefined, but he is clearly showing signs of failing to thrive and the dying process.    With regard to work up during this  hospitalization: his encephalopathy is not from vascular injury (his BP has been good, his MRA brain on day 2 of hospitalization showed no severe intracranial stenosis, echocardiogram normal) unless new stroke is a contributor; there is no evidence of infection, no localizing signs, pneumonia, fever, nor bacteremia as well as normal HIV and RPR during last admission for encephalopathy; there is no known cancer; this is not a medication induced encephalopathy: he has been off medications of all kinds and undergone dialysis twice during this  hospitalization without change; EEG was refused by patient this time, but was negative during last hospitalziation for encephaloapthy; there is no new trauma (although old traumatic SDH is clearly contributing); this is not endocrine or metabolic as his TSH is normal, electrolytes are normal and been dialyzed twice, B12 and ammonia was normal during last hospitalization, and phosphate is only moderately low -Continue IV fluids -Liberalized diet, family are aware of aspiration risk, patient however is refusing food -PEG tube if possible -To LTAC -If family desire to re-explore Hospice, this is reasonable -DNR/DNI, confirmed with wife   Stroke -Continue daily aspirin, statin, Coreg if he takes them  Tachycardia Leukocytosis There are no localizing signs of infection.  ESRD -Consult to Nephrology for routine HD  Diabetes This is diet controlled per notes. -SSI q4hrs, low threshold to discontinue  Anemia of renal disease Stable around baseline ~12.  Other medications -May continue venlafaxine and ranitidine and dronabinol if willing/able to take       DVT prophylaxis: SCDs Code Status: DO NOT RESUSCITATE Family Communication: Son at bedside, wife by phone Disposition Plan: LTAC when available.   Consultants:   Neurology  Nephrology  Palliative Care  Procedures:   MRI brain  MRA head  Carotid US  Echocardiogram     Subjective: Lying in bed, sometimes wakes to touch, no vocalizations.  Less alert today.  Lethargic.  Unable to sit or even raise head for more than a few seconds.   No new fever.  Refusing all oral intake per nursing.    Objective: Vitals:   03/24/17 0426 03/24/17 1007 03/24/17 1319 03/24/17 1711  BP: 137/88 124/67 (!) 146/74 (!) 152/76  Pulse: (!) 124 (!) 104 (!) 105 (!) 104  Resp: 20 20  20   Temp: 98.8 F (37.1 C) 99.5 F (37.5 C) 99.5 F (37.5 C) 98.7 F (37.1 C)  TempSrc: Axillary Axillary Axillary Oral  SpO2: 92% 92% 93% 96%    Weight:      Height:        Intake/Output Summary (Last 24 hours) at 03/24/2017 1759 Last data filed at 03/24/2017 0300 Gross per 24 hour  Intake 298.33 ml  Output -  Net 298.33 ml   Filed Weights   03/20/17 2359 03/23/17 1345 03/23/17 1736  Weight: 52.7 kg (116 lb 2.9 oz) 53.3 kg (117 lb 8.1 oz) 53 kg (116 lb 13.5 oz)    Examination: General appearance: Elderly adult male, awake lying in bed, most of the time, eyes are closed now, sometimes opens eyes and makes brief eye contact, then slumps back.   HEENT:  Drooling.  Dentition poor.  OP dry Neuro: Does not follow commands at all.  Limp.  Makes eye contact to voice, then slumps back asleep.  Pupils equal.     Data Reviewed: I have personally reviewed following labs and imaging studies:  CBC: Recent Labs  Lab 03/19/17 1759 03/19/17 1825 03/20/17 0246 03/22/17 1054 03/23/17 0448  WBC 4.1  --  5.3 13.9* 16.7*  NEUTROABS 2.3  --   --   --   --   HGB 12.5* 13.9 11.9* 13.6 12.9*  HCT 37.9* 41.0 37.3* 40.9 39.9  MCV 93.8  --  92.6 93.8 94.3  PLT 197  --  219 224 017   Basic Metabolic Panel: Recent Labs  Lab 03/19/17 1759 03/19/17 1825 03/20/17 0246 03/20/17 2300 03/22/17 1054 03/23/17 0448  NA 141 141 138  --  135 139  K 4.9 4.7 4.0  --  5.2* 5.1  CL 100* 99* 99*  --  96* 96*  CO2 28  --  26  --  23 20*  GLUCOSE 199* 195* 102*  --  162* 139*  BUN 31* 33* 36*  --  46* 59*  CREATININE 9.91* 9.60* 10.59*  --  10.93* 12.79*  CALCIUM 9.4  --  9.2  --  9.5 9.1  PHOS  --   --   --  1.6*  --   --    GFR: Estimated Creatinine Clearance: 4 mL/min (A) (by C-G formula based on SCr of 12.79 mg/dL (H)). Liver Function Tests: Recent Labs  Lab 03/19/17 1759 03/20/17 0246 03/22/17 1054  AST 12* 7* 12*  ALT 10* 8* 8*  ALKPHOS 90 89 97  BILITOT 0.6 0.8 1.2  PROT 7.8 7.4 8.5*  ALBUMIN 3.7 3.7 3.8   No results for input(s): LIPASE, AMYLASE in the last 168 hours. Recent Labs  Lab 03/20/17 0246  AMMONIA 23    Coagulation Profile: Recent Labs  Lab 03/19/17 1759  INR 1.09   Cardiac Enzymes: No results for input(s): CKTOTAL, CKMB, CKMBINDEX, TROPONINI in the last 168 hours. BNP (last 3 results) Recent Labs    07/09/16 1438 07/31/16 1104 08/19/16 1155  PROBNP 1,044.0* 343.0* 349.0*   HbA1C: No results for input(s): HGBA1C in the last 72 hours. CBG: Recent Labs  Lab 03/22/17 2328 03/23/17 0348 03/23/17 0730 03/23/17 1117 03/24/17 0749  GLUCAP 120* 144* 186* 206* 205*   Lipid Profile: No results for input(s): CHOL, HDL, LDLCALC, TRIG, CHOLHDL, LDLDIRECT in the last 72 hours. Thyroid Function Tests: No results for input(s): TSH, T4TOTAL, FREET4, T3FREE, THYROIDAB in the last 72 hours. Anemia Panel: No results for input(s): VITAMINB12, FOLATE, FERRITIN, TIBC, IRON, RETICCTPCT in the last 72 hours. Urine analysis:    Component Value Date/Time   COLORURINE YELLOW 04/01/2016 [place] 04/01/2016 0939   LABSPEC >=1.030 (A) 04/01/2016 0939   PHURINE 5.5 04/01/2016 0939   GLUCOSEU NEGATIVE 04/01/2016 0939   HGBUR MODERATE (A) 04/01/2016 0939   BILIRUBINUR SMALL (A) 04/01/2016 0939   BILIRUBINUR small 06/21/2012 1041   KETONESUR TRACE (A) 04/01/2016 0939   PROTEINUR >300 (A) 12/25/2015 1028   UROBILINOGEN 0.2 04/01/2016 0939   NITRITE NEGATIVE 04/01/2016 0939   LEUKOCYTESUR NEGATIVE 04/01/2016 0939   Sepsis Labs: @LABRCNTIP (procalcitonin:4,lacticacidven:4)  ) Recent Results (from the past 240 hour(s))  MRSA PCR Screening     Status: None   Collection Time: 03/20/17 11:59 PM  Result Value Ref Range Status   MRSA by PCR NEGATIVE NEGATIVE Final    Comment:        The GeneXpert MRSA Assay (FDA approved for NASAL specimens only), is one component of a comprehensive MRSA colonization surveillance program. It is not intended to diagnose MRSA infection nor to guide or monitor treatment for MRSA infections.   Culture, blood (routine x 2)     Status: None  (Preliminary result)   Collection Time: 03/22/17  2:15 PM  Result Value Ref Range Status   Specimen Description BLOOD RIGHT HAND  Final   Special Requests   Final    BOTTLES DRAWN AEROBIC ONLY Blood Culture results may not be optimal due to an inadequate volume of blood received in culture bottles   Culture NO GROWTH 2 DAYS  Final   Report Status PENDING  Incomplete  Culture, blood (routine x 2)     Status: None (Preliminary result)   Collection Time: 03/22/17  2:20 PM  Result Value Ref Range Status   Specimen Description BLOOD RIGHT HAND  Final   Special Requests   Final    BOTTLES DRAWN AEROBIC ONLY Blood Culture adequate volume   Culture NO GROWTH 2 DAYS  Final   Report Status PENDING  Incomplete         Radiology Studies: No results found.      Scheduled Meds: . aspirin  300 mg Rectal Daily   Or  . aspirin  325 mg Oral Daily  . atorvastatin  80 mg Oral q1800  . carvedilol  3.125 mg Oral BID WC  . dronabinol  2.5 mg Oral QAC lunch   Continuous Infusions: . sodium chloride 50 mL/hr at 03/24/17 1446  . sodium chloride 50 mL/hr at 03/24/17 1445     LOS: 5 days    Time spent: 25 minutes    Edwin Dada, MD [facility] 03/24/2017, 5:59 PM     Pager [phone] --- please page though AMION:  [email or web address] If 7PM-7AM, please contact night-coverage

## 2017-03-24 NOTE — Progress Notes (Signed)
[name] ROUNDING NOTE   Subjective:   Interval History:  Acute CVA  MWF dialysis [facility]   Stopped antibiotics   Objective:  Vital signs in last 24 hours:  Temp:  [97.9 F (36.6 C)-99.3 F (37.4 C)] 98.8 F (37.1 C) (01/08 0426) Pulse Rate:  [60-127] 124 (01/08 0426) Resp:  [17-22] 20 (01/08 0426) BP: (97-158)/(61-88) 137/88 (01/08 0426) SpO2:  [90 %-96 %] 92 % (01/08 0426) Weight:  [116 lb 13.5 oz (53 kg)-117 lb 8.1 oz (53.3 kg)] 116 lb 13.5 oz (53 kg) (01/07 1736)  Weight change:  Filed Weights   03/20/17 2359 03/23/17 1345 03/23/17 1736  Weight: 116 lb 2.9 oz (52.7 kg) 117 lb 8.1 oz (53.3 kg) 116 lb 13.5 oz (53 kg)    Intake/Output: I/O last 3 completed shifts: In: 298.3 [I.V.:298.3] Out: -25    Intake/Output this shift:  No intake/output data recorded.  CVS- RRR RS- CTA ABD- BS present soft non-distended EXT- no edema   LFA AVF    Basic Metabolic Panel: Recent Labs  Lab 03/19/17 1759 03/19/17 1825 03/20/17 0246 03/20/17 2300 03/22/17 1054 03/23/17 0448  NA 141 141 138  --  135 139  K 4.9 4.7 4.0  --  5.2* 5.1  CL 100* 99* 99*  --  96* 96*  CO2 28  --  26  --  23 20*  GLUCOSE 199* 195* 102*  --  162* 139*  BUN 31* 33* 36*  --  46* 59*  CREATININE 9.91* 9.60* 10.59*  --  10.93* 12.79*  CALCIUM 9.4  --  9.2  --  9.5 9.1  PHOS  --   --   --  1.6*  --   --     Liver Function Tests: Recent Labs  Lab 03/19/17 1759 03/20/17 0246 03/22/17 1054  AST 12* 7* 12*  ALT 10* 8* 8*  ALKPHOS 90 89 97  BILITOT 0.6 0.8 1.2  PROT 7.8 7.4 8.5*  ALBUMIN 3.7 3.7 3.8   No results for input(s): LIPASE, AMYLASE in the last 168 hours. Recent Labs  Lab 03/20/17 0246  AMMONIA 23    CBC: Recent Labs  Lab 03/19/17 1759 03/19/17 1825 03/20/17 0246 03/22/17 1054 03/23/17 0448  WBC 4.1  --  5.3 13.9* 16.7*  NEUTROABS 2.3  --   --   --   --   HGB 12.5* 13.9 11.9* 13.6 12.9*  HCT 37.9* 41.0 37.3* 40.9 39.9  MCV 93.8  --  92.6 93.8 94.3   PLT 197  --  219 224 221    Cardiac Enzymes: No results for input(s): CKTOTAL, CKMB, CKMBINDEX, TROPONINI in the last 168 hours.  BNP: Invalid input(s): POCBNP  CBG: Recent Labs  Lab 03/22/17 2328 03/23/17 0348 03/23/17 0730 03/23/17 1117 03/24/17 0749  GLUCAP 120* 144* 186* 66* 205*    Microbiology: Results for orders placed or performed during the hospital encounter of 03/19/17  MRSA PCR Screening     Status: None   Collection Time: 03/20/17 11:59 PM  Result Value Ref Range Status   MRSA by PCR NEGATIVE NEGATIVE Final    Comment:        The GeneXpert MRSA Assay (FDA approved for NASAL specimens only), is one component of a comprehensive MRSA colonization surveillance program. It is not intended to diagnose MRSA infection nor to guide or monitor treatment for MRSA infections.   Culture, blood (routine x 2)     Status: None (Preliminary result)   Collection Time:  03/22/17  2:15 PM  Result Value Ref Range Status   Specimen Description BLOOD RIGHT HAND  Final   Special Requests   Final    BOTTLES DRAWN AEROBIC ONLY Blood Culture results may not be optimal due to an inadequate volume of blood received in culture bottles   Culture NO GROWTH 1 DAY  Final   Report Status PENDING  Incomplete  Culture, blood (routine x 2)     Status: None (Preliminary result)   Collection Time: 03/22/17  2:20 PM  Result Value Ref Range Status   Specimen Description BLOOD RIGHT HAND  Final   Special Requests   Final    BOTTLES DRAWN AEROBIC ONLY Blood Culture adequate volume   Culture NO GROWTH 1 DAY  Final   Report Status PENDING  Incomplete    Coagulation Studies: No results for input(s): LABPROT, INR in the last 72 hours.  Urinalysis: No results for input(s): COLORURINE, LABSPEC, PHURINE, GLUCOSEU, HGBUR, BILIRUBINUR, KETONESUR, PROTEINUR, UROBILINOGEN, NITRITE, LEUKOCYTESUR in the last 72 hours.  Invalid input(s): APPERANCEUR    Imaging: Dg Chest Port 1 View  Result  Date: 03/22/2017 CLINICAL DATA:  72 year old male with cough EXAM: PORTABLE CHEST 1 VIEW COMPARISON:  Prior chest x-ray 03/10/2017 FINDINGS: Stable cardiac mediastinal contours. Atherosclerotic calcifications again noted in the transverse aorta. The lungs are clear. Minimal chronic bronchitic changes are stable. No acute osseous abnormality. No pneumothorax or pleural effusion. IMPRESSION: 1. No acute cardiopulmonary process. 2.  Aortic Atherosclerosis (ICD10-170.0) Electronically Signed   By: Jacqulynn Cadet M.D.   On: 03/22/2017 14:34     Medications:    . aspirin  300 mg Rectal Daily   Or  . aspirin  325 mg Oral Daily  . atorvastatin  80 mg Oral q1800  . carvedilol  3.125 mg Oral BID WC  . dronabinol  2.5 mg Oral QAC lunch   acetaminophen **OR** acetaminophen, antiseptic oral rinse, polyvinyl alcohol  Assessment/ Plan:  1. ESRD MWF [facility] 2. New Small CerebellarischemicCVA 3. AMS with Dementia 4. Progressive decline/FTT   May need feeding tube  5. Persistent tachycardia 6. Traumatic SDH 01/2017 seen at [facility] 7. Chronic sCHF 8. CAD 9. HTN, stable 10. Anemia, stable 11. CKD BMD: P was 1.6 post HD I believe, follow, not on binders  12. ? Seizures  Discussed with wife this morning  Want to continue dialysis and will need [facility] placement     LOS: 5 [name] @TODAY @9 :49 AM

## 2017-03-24 NOTE — Consult Note (Addendum)
Physical Medicine and Rehabilitation Consult Reason for Consult: Functional deficits due to stroke in setting of dementia and recent TBI Referring Physician: Dr. Loleta Books.    HPI: [name] is a 72 y.o. male with history of ESRD--HD, dementia, T2DM with neuropathy, fatty liver,  OSA, fall 01/2017 with SDH and facial fractures, admission 02/2017 for toxic metabolic encephalopathy with question of seizures. History taken from chart review and wife. He was readmitted on 03/19/16 with increased confusion and agitation. CT head reviewed, showing improvement in SDH.  Neurology recommended starting ASA for acute left cerebellar infarct. Palliative care consulted to discuss goals of care as patient has had progressive cognitive decline since Oct and decrease in verbal output since TBI.  Family has elected on DNR with comfort feeding with known aspiration risk. PT/OT evaluation done and patient limited cognitive deficits as well as bouts of agitation. CIR recommended due to functional deficits.  Discussed with hospitalist.   Review of Systems  Unable to perform ROS: Medical condition   Past Medical History:  Diagnosis Date  . 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 Anemia, iron deficiency 03/24/2011   a. Recurrent GI bleed, tx with periodic iron infusions.  . 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 AVM (arteriovenous malformation) of duodenum, acquired    egds in 01/2012, 04/2011  . BPH (benign prostatic hyperplasia)   . CAD (coronary artery disease)    a. Cath 09/2012: moderate borderline CAD in mid LAD/small diagonal branch, mild RCA stenosis, to be managed medically   . Chronic lower back pain   . Diabetic peripheral neuropathy ([place]) 2014   foot pain.  . Essential hypertension 02/01/2012  . Fatty liver    on CT of 11/2010  . GERD (gastroesophageal reflux disease)   . GI bleed    a. Recurrent GI bleed, tx with IV iron. b. Per heme notes - likely AVMs 04/2012 (tx with cauterization  several months ago).  . Hematuria    a. Urology note scan from 07/2012: cystoscopy without evidence for bladder lesion, only lateral hypertrophy of posterior urethra, bladder impression from BPH. b. Pt states he had "some tests" scheduled for later in July 2014.  . High cholesterol   . Hyperlipidemia 02/01/2012  . Hypertension   . Hypertensive urgency 07/06/2016  . 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 Orthostatic hypotension    a. Tx with florinef.  . Peripheral neuropathy   . Polyp, colonic    Colonoscopy 01/2012 "benign" polyp  . Prostate cancer ([place])   . Sleep apnea    "had mask; couldn't sleep in it" (09/20/2012)  . Smoker   . SOB (shortness of breath) 07/06/2016  . Stage III chronic kidney disease ([name])    a. Stage 3 (DM with complications ->CKD, peripheral neuropathy).  . Stroke ([place])    x 2  . Symptomatic anemia 04/03/2015  . Tinea pedis 05/24/2012  . Tobacco use 02/01/2012  . Type 2 diabetes, uncontrolled, with neuropathy ([place]) 02/01/2012  . Type II diabetes mellitus ([place])    a. Dx 1994, uncontrolled.    Past Surgical History:  Procedure Laterality Date  . ENTEROSCOPY N/A 03/01/2013   Procedure: ENTEROSCOPY;  Surgeon: Inda Castle, MD;  Location: [facility];  Service: Endoscopy;  Laterality: N/A;  . ENTEROSCOPY N/A 06/26/2016   Procedure: ENTEROSCOPY;  Surgeon: Milus Banister, MD;  Location: [place];  Service: Endoscopy;  Laterality: N/A;  this is a push enteroscopy  . ENTEROSCOPY N/A 10/23/2016  Procedure: ENTEROSCOPY;  Surgeon: Gatha Mayer, MD;  Location: [facility];  Service: Endoscopy;  Laterality: N/A;  . GIVENS CAPSULE STUDY N/A 04/05/2015   Procedure: GIVENS CAPSULE STUDY;  Surgeon: Gatha Mayer, MD;  Location: [place];  Service: Endoscopy;  Laterality: N/A;  . INGUINAL HERNIA REPAIR Right 2011  . LEFT HEART CATH AND CORONARY ANGIOGRAPHY N/A 12/23/2016   Procedure: LEFT HEART CATH AND CORONARY ANGIOGRAPHY;  Surgeon: Leonie Man, MD;  Location: [facility];  Service: Cardiovascular;  Laterality: N/A;  . LEFT HEART CATHETERIZATION WITH CORONARY ANGIOGRAM N/A 09/21/2012   Procedure: LEFT HEART CATHETERIZATION WITH CORONARY ANGIOGRAM;  Surgeon: Peter M Martinique, MD;  Location: [facility];  Service: Cardiovascular;  Laterality: N/A;  . LUMBAR DISC SURGERY  1980's  . LYMPHADENECTOMY Bilateral 01/19/2013   Procedure: LYMPHADENECTOMY;  Surgeon: Bernestine Amass, MD;  Location: [facility];  Service: Urology;  Laterality: Bilateral;  . ROBOT ASSISTED LAPAROSCOPIC RADICAL PROSTATECTOMY N/A 01/19/2013   Procedure: ROBOTIC ASSISTED LAPAROSCOPIC RADICAL PROSTATECTOMY;  Surgeon: Bernestine Amass, MD;  Location: [facility];  Service: Urology;  Laterality: N/A;  . SHOULDER OPEN ROTATOR CUFF REPAIR Left 1980's   Family History  Problem Relation Age of Onset  . Stroke Father        Died at 74  . Diabetes Mother   . Heart attack Brother        Died at 28  . Diabetes Sister   . Stomach cancer Brother   . Heart attack Sister    Social History:  Married. Wife works days-- reports that he has been smoking cigarettes.  He started smoking about 48 years ago. He has a 15.00 pack-year smoking history. he has never used smokeless tobacco. He reports that he does not drink alcohol or use drugs.  Allergies: No Known Allergies Medications Prior to Admission  Medication Sig Dispense Refill  . ARTIFICIAL TEAR SOLUTION OP Instill 1-2 drops into both eyes two to three times a day as needed for dry eyes    . atorvastatin (LIPITOR) 80 MG tablet Take 80 mg by mouth at bedtime.    . carvedilol (COREG) 3.125 MG tablet Take 3.125 mg by mouth 2 (two) times daily with a meal.    . dronabinol (MARINOL) 2.5 MG capsule Take 2.5 mg by mouth once a day  0  . multivitamin (RENA-VIT) TABS tablet Take 1 tablet by mouth every Monday, Wednesday, and Friday.     . ranitidine (ZANTAC) 150 MG capsule Take 1 capsule (150 mg total) by mouth 2 (two) times daily. 180 capsule 1   . venlafaxine (EFFEXOR) 37.5 MG tablet Take 37.5 mg by mouth every morning.      Home: Home Living Family/patient expects to be discharged to:: [place]: Spouse/significant other Available Help at Discharge: Family, Available PRN/intermittently Type of Home: House Home Access: Stairs to enter [employer] of Steps: 2 Entrance Stairs-Rails: None Home Layout: One level Bathroom Shower/Tub: Chiropodist: [place]: [employer] - single point, Civil engineer, contracting, Environmental consultant - 2 wheels, Environmental consultant - 4 wheels, Wheelchair - manual Additional Comments: wife works during the day   Functional History: Prior Function Level of Independence: Needs assistance Gait / Transfers Assistance Needed: Mod indep for ambulation with RW; catches tranport from SCAT to HD while wife works ADL's / Hydrologist Assistance Needed: Pt indep with ADLs; wife reports she makes sure he takes complete shower Comments: All information above from previous admission. Pt verbalizing  only once during session.  Functional Status:  Mobility: Bed Mobility Overal bed mobility: Needs Assistance Bed Mobility: Supine to Sit, Sit to Supine Supine to sit: Mod assist Sit to supine: Min assist General bed mobility comments: Pt did not follow commands to initiate bed mobility attempted to assist in rolling and supine to sit but pt unwiling to participate and cursing at therapist.  Transfers Overall transfer level: Needs assistance Equipment used: None Transfers: Sit to/from Stand Sit to Stand: Min assist, Mod assist, Max assist General transfer comment: Unable to test Ambulation/Gait General Gait Details: Deferred secondary to safety and decreased ability to follow commands    ADL: ADL Overall ADL's : Needs assistance/impaired General ADL Comments: Currently requires total assist overall for ADL. Attempted to assist pt to sit at EOB with total assist with pt becoming  agitated and cursing.   Cognition: Cognition Overall Cognitive Status: Impaired/Different from baseline Orientation Level: Oriented to person Cognition Arousal/Alertness: Lethargic Behavior During Therapy: Flat affect Overall Cognitive Status: Impaired/Different from baseline Area of Impairment: Orientation, Attention, Following commands, Awareness, Problem solving Orientation Level: (difficult to assess as pt not speaking) Current Attention Level: Focused Following Commands: Follows one step commands inconsistently Awareness: (pre-intellectual) Problem Solving: Slow processing, Decreased initiation, Difficulty sequencing, Requires verbal cues, Requires tactile cues General Comments: Pt opening eyes and looking briefly at therapist. Only verbalized once during session when therapist attempting to assist to EOB and pt cursed and said "no."  Blood pressure 137/88, pulse (!) 124, temperature 98.8 F (37.1 C), temperature source Axillary, resp. rate 20, height 5\' 7"  (1.702 m), weight 53 kg (116 lb 13.5 oz), SpO2 92 %. Physical Exam  Vitals reviewed. Constitutional: He appears well-developed.  Frail  HENT:  Head: Normocephalic and atraumatic.  Poor dentition  Eyes: Right eye exhibits no discharge. Left eye exhibits no discharge. No scleral icterus.  Neck: Normal range of motion. Neck supple.  Cardiovascular: Regular rhythm.  +Tachycardia  Respiratory: Breath sounds normal. No respiratory distress.  GI: Soft. Bowel sounds are normal.  Musculoskeletal: He exhibits no edema or tenderness.  Neurological:  Briefly opens eyes Does not cooperate in MMT No spontaneous movement noted. Left gaze preference Right inattention Nonverbal  Skin: Skin is warm and dry.  Psychiatric:  Unable to assess due to mentation    Results for orders placed or performed during the hospital encounter of 03/19/17 (from the past 24 hour(s))  Glucose, capillary     Status: Abnormal   Collection Time:  03/23/17 11:17 AM  Result Value Ref Range   Glucose-Capillary 206 (H) 65 - 99 mg/dL  Glucose, capillary     Status: Abnormal   Collection Time: 03/24/17  7:49 AM  Result Value Ref Range   Glucose-Capillary 205 (H) 65 - 99 mg/dL   Comment 1 Notify RN    Comment 2 Document in Chart    Dg Chest Port 1 View  Result Date: 03/22/2017 CLINICAL DATA:  72 year old male with cough EXAM: PORTABLE CHEST 1 VIEW COMPARISON:  Prior chest x-ray 03/10/2017 FINDINGS: Stable cardiac mediastinal contours. Atherosclerotic calcifications again noted in the transverse aorta. The lungs are clear. Minimal chronic bronchitic changes are stable. No acute osseous abnormality. No pneumothorax or pleural effusion. IMPRESSION: 1. No acute cardiopulmonary process. 2.  Aortic Atherosclerosis (ICD10-170.0) Electronically Signed   By: Jacqulynn Cadet M.D.   On: 03/22/2017 14:34    Assessment/Plan: Diagnosis: Left cerebellar infact, right posterior fossa SDH Labs and images independently reviewed.  Records reviewed and summated above. Stroke: Continue  secondary stroke prophylaxis and Risk Factor Modification listed below:   Antiplatelet therapy:   Blood Pressure Management:  Continue current medication with prn's with permisive HTN per primary team Statin Agent:   Tobacco abuse DM ?hemiparesis: fit for orthosis to prevent contractures (resting hand splint for day, wrist cock up splint at night, PRAFO, etc) Motor recovery: Fluoxetine  1. Does the need for close, 24 hr/day medical supervision in concert with the patient's rehab needs make it unreasonable for this patient to be served in a less intensive setting? Potentially 2. Co-Morbidities requiring supervision/potential complications: DM type 2 with peripheral neuropathy (Monitor in accordance with exercise and adjust meds as necessary), leukocytosis (cont to monitor for signs and symptoms of infection, further workup if indicated), ABLA on anemia of chronic disease  (transfuse if necessary to ensure appropriate perfusion for increased activity tolerance),  Tobacco abuse (counsel), ESRD (recs per Nephro), history of prostate CA (monitor), HLD (cont to monitor), GERD (monitor), CAD (cont meds), BPH (monitor for retention) 3. Due to safety, disease management, medication administration and patient education, does the patient require 24 hr/day rehab nursing? Yes 4. Does the patient require coordinated care of a physician, rehab nurse, PT (1-2 hrs/day, 5 days/week), OT (1-2 hrs/day, 5 days/week) and SLP (1-2  hrs/day, 5 days/week) to address physical and functional deficits in the context of the above medical diagnosis(es)? Potentially Addressing deficits in the following areas: balance, endurance, locomotion, strength, transferring, bowel/bladder control, bathing, dressing, feeding, grooming, toileting, cognition, speech, language, swallowing and psychosocial support 5. Can the patient actively participate in an intensive therapy program of at least 3 hrs of therapy per day at least 5 days per week? No 6. The potential for patient to make measurable gains while on inpatient rehab is fair 7. Anticipated functional outcomes upon discharge from inpatient rehab are min assist  with PT, min assist with OT, mod assist and max assist with SLP. 8. Estimated rehab length of stay to reach the above functional goals is: 14-18 days. 9. Anticipated D/C setting: SNF 10. Anticipated post D/C treatments: SNF 11. Overall Rehab/Functional Prognosis: fair  RECOMMENDATIONS: This patient's condition is appropriate for continued rehabilitative care in the following setting: Pt with poor activity tolerance and unlikely to make meaningful gain after short IRF stay.  Recommend SNF if family is decling comfort care with hospice.  Patient has agreed to participate in recommended program. Potentially Note that insurance prior authorization may be required for reimbursement for recommended  care.  Comment: Rehab Admissions Coordinator to follow up.  Delice Lesch, MD, ABPMR 03/24/2017  Reesa Chew, PA-C 03/24/2017

## 2017-03-24 NOTE — Progress Notes (Signed)
10:22 am: CM received phone call from Dr Loleta Books about exploring LTACH for this patient. Pt unable to d/c to SNF d/t not being able to sit up for outpatient dialysis. Pt is to receive Cortrak feeding tube today also.   10:26 am: CM went and spoke to the patient and family about LTACH. They were interested in [facility]. However they also asked about having the patient transferred to [facility]. Patient's son asking for Dr Loleta Books to look into having him transferred.   10:30 am: Dr Loleta Books texted via [facility] about sons request and that they are interested in [facility] if pt is unable to be transferred.   10:32 am: notified [facility] of referral for potential LTACH admission.   CM following.

## 2017-03-24 NOTE — Consult Note (Signed)
[facility]: Met with pt's wife at 0930am at bedside. She is tearful and states that she and h is children just are not sure that they are making the right decision to move him to hospice care at our facility. They feel he needs to continue to have dialysis and give him some moe time. They state that they saw improvement after his dialysis treatment yesterday. I spoke to them about the transition an d what that would mean and they have decided that hospice is not right for them at this time. We will not pursue pt unless re consulted based on families wishes.   TC to [place] with SW and she was aware and states they will look for an [place] [phone]

## 2017-03-25 LAB — GLUCOSE, CAPILLARY
GLUCOSE-CAPILLARY: 149 mg/dL — AB (ref 65–99)
Glucose-Capillary: 106 mg/dL — ABNORMAL HIGH (ref 65–99)

## 2017-03-25 LAB — RENAL FUNCTION PANEL
Albumin: 2.9 g/dL — ABNORMAL LOW (ref 3.5–5.0)
Anion gap: 17 — ABNORMAL HIGH (ref 5–15)
BUN: 68 mg/dL — AB (ref 6–20)
CO2: 23 mmol/L (ref 22–32)
Calcium: 8.7 mg/dL — ABNORMAL LOW (ref 8.9–10.3)
Chloride: 101 mmol/L (ref 101–111)
Creatinine, Ser: 10.93 mg/dL — ABNORMAL HIGH (ref 0.61–1.24)
GFR calc Af Amer: 5 mL/min — ABNORMAL LOW (ref >60)
GFR calc non Af Amer: 4 mL/min — ABNORMAL LOW (ref >60)
GLUCOSE: 152 mg/dL — AB (ref 65–99)
PHOSPHORUS: 6.9 mg/dL — AB (ref 2.5–4.6)
POTASSIUM: 4.3 mmol/L (ref 3.5–5.1)
Sodium: 141 mmol/L (ref 135–145)

## 2017-03-25 LAB — CBC
HEMATOCRIT: 33.9 % — AB (ref 39.0–52.0)
Hemoglobin: 11.1 g/dL — ABNORMAL LOW (ref 13.0–17.0)
MCH: 30.9 pg (ref 26.0–34.0)
MCHC: 32.7 g/dL (ref 30.0–36.0)
MCV: 94.4 fL (ref 78.0–100.0)
Platelets: 211 10*3/uL (ref 150–400)
RBC: 3.59 MIL/uL — ABNORMAL LOW (ref 4.22–5.81)
RDW: 18.4 % — AB (ref 11.5–15.5)
WBC: 13.9 10*3/uL — ABNORMAL HIGH (ref 4.0–10.5)

## 2017-03-25 MED ORDER — SODIUM CHLORIDE 0.9 % IV SOLN
100.0000 mL | INTRAVENOUS | Status: DC | PRN
Start: 2017-03-25 — End: 2017-03-25

## 2017-03-25 MED ORDER — PENTAFLUOROPROP-TETRAFLUOROETH EX AERO: 1.0000 "application " | INHALATION_SPRAY | CUTANEOUS | Status: DC | PRN

## 2017-03-25 MED ORDER — SODIUM CHLORIDE 0.9 % IV SOLN: 100.0000 mL | INTRAVENOUS | Status: DC | PRN

## 2017-03-25 MED ORDER — HEPARIN SODIUM (PORCINE) 1000 UNIT/ML DIALYSIS: 1000.0000 [IU] | INTRAMUSCULAR | Status: DC | PRN

## 2017-03-25 MED ORDER — ALTEPLASE 2 MG IJ SOLR: 2.0000 mg | Freq: Once | INTRAMUSCULAR | Status: DC | PRN

## 2017-03-25 MED ORDER — LIDOCAINE-PRILOCAINE 2.5-2.5 % EX CREA: 1.0000 "application " | TOPICAL_CREAM | CUTANEOUS | Status: DC | PRN

## 2017-03-25 MED ORDER — LIDOCAINE HCL (PF) 1 % IJ SOLN
5.0000 mL | INTRAMUSCULAR | Status: DC | PRN
Start: 2017-03-25 — End: 2017-03-25

## 2017-03-25 NOTE — Progress Notes (Signed)
Updated by Darius Bump PA regarding her conversation with wife and family about PEG tube. Daughter, [name] also left voicemail for Palliative NP Juanda Crumble, who is not here today. Spoke with [name] via telephone. She tells me family is going to discuss goals of care tonight. I offered to arrange a meeting with her and family today or tomorrow. [name] asks for my phone number and that she will call when they are ready to meet again. I also went to bedside and introduced myself to wife. Reassured her of continued support from palliative during hospitalization. Daughter and wife have PMT contact information.   NO CHARGE  Ihor Dow, FNP-C Palliative Medicine Team  Phone: [phone] Fax: [phone]

## 2017-03-25 NOTE — Progress Notes (Signed)
PROGRESS NOTE    [name]  KTG:[id number] DOB: [birth year] DOA: 03/19/2017 PCP: Mackie Pai, PA-C      Brief Narrative:  [name] is a 72 yo M with ESRD on MWF HD, HTN, DM and recent traumatic subdural hematoma, not evacuated who presents with stroke.  Now has failure to thrive.  Hospice referral was recommended, family/POA have requested continued aggressive care and hospice plans were canceled. However on 1/9, after discussing with IR team, family more agreeable to meet with palliative care team.    Assessment & Plan:  Principal Problem:   Acute CVA (cerebrovascular accident) [facility]) Active Problems:   Anemia of renal disease   ESRD (end stage renal disease) ([place])   Acute encephalopathy   DM (diabetes mellitus), type 2 with renal complications [facility])   Advance care planning   Goals of care, counseling/discussion   Palliative care by specialist   Leukocytosis   Tobacco abuse   History of prostate cancer   Benign prostatic hyperplasia      Encephalopathy Delirium Failure to thrive See Dr. [name] note from 1/6.  This was not the patient's presenting complaint, but has become the central concern.  The patient has had a decline since mid year last year (even before his SDH, he was declining, had seen Neurology in June and then October, noted to have dementia, starting to need help with dressing, ADLs).    This has obviously accelerated since the SDH, to the point that in most of the notes from his Dec hostpialization here, and his office visit follow up, he was essentially not talking.  Wife relates that to her (in that interval), he would still say brief things, like "let's get something to eat", but this appears to be part of an overall pattern of decline that precedes the SDH --> but has clearly accelerated after.  Appreciated Palliative Care recommendations, their input is appreciated.  His precise illness process is undefined, but he is clearly showing signs of  failing to thrive and the dying process.    With regard to work up during this hospitalization: his encephalopathy is not from vascular injury (his BP has been good, his MRA brain on day 2 of hospitalization showed no severe intracranial stenosis, echocardiogram normal) unless new stroke is a contributor (MRI brain 03/20/17 showed 1 cm acute ischemic non-hemorrhagic infarct central left cerebellar hemisphere, near interval resolution of previously seen right posterior fossa subdural collection, advanced parenchymal volume loss and multiple chronic microhemorrhages throughout the brain that showed however this stroke less likely to have caused his overall decline by itself); there is no evidence of infection, no localizing signs, pneumonia, fever, nor bacteremia as well as normal HIV and RPR during last admission for encephalopathy; there is no known cancer; this is not a medication induced encephalopathy: he has been off medications of all kinds and undergone dialysis during this hospitalization without change; EEG was refused by patient this time, but was negative during last hospitalziation for encephaloapthy; there is no new trauma (although old traumatic SDH is clearly contributing); this is not endocrine or metabolic as his TSH is normal, electrolytes are normal and been dialyzed, B12 and ammonia was normal during last hospitalization, and phosphate is only moderately low  - As of 1/9: IR team met with family regarding PEG tube but they seem reluctant after being made aware that this may not fix his overall condition. They are planning to meet with palliative care regarding goals of care discussion. - I discussed  in detail with patient's spouse who stated that she will discuss with her children this evening.  Acute Stroke -Continue daily aspirin, statin, Coreg if he takes them  Tachycardia Leukocytosis There are no localizing signs of infection.  ESRD -Consult to Nephrology for routine HD. Seen at  HD on 1/19.  Diabetes This is diet controlled per notes. -SSI q4hrs, low threshold to discontinue  Anemia of renal disease Stable around baseline ~12.  Other medications -May continue venlafaxine and ranitidine and dronabinol if willing/able to take       DVT prophylaxis: SCDs Code Status: DO NOT RESUSCITATE Family Communication: Discussed in detail with patient's spouse. Updated care and answered questions. Disposition Plan: Pending input after palliative care meeting.    Consultants:   Neurology  Nephrology  Palliative Care  Procedures:   MRI brain  MRA head  Carotid US  Echocardiogram     Subjective: Seen this morning at HD. Nonverbal. Tracks with eyes. Does not follow instructions.   Objective: Vitals:   03/25/17 1000 03/25/17 1030 03/25/17 1100 03/25/17 1135  BP: 134/76 131/66 132/67 127/64  Pulse: (!) 109 (!) 107 (!) 112 (!) 110  Resp:    (!) 110  Temp:    97.9 F (36.6 C)  TempSrc:    Axillary  SpO2:    98%  Weight:    50.4 kg (111 lb 1.8 oz)  Height:        Intake/Output Summary (Last 24 hours) at 03/25/2017 1626 Last data filed at 03/25/2017 1135 Gross per 24 hour  Intake 1224.16 ml  Output 1000 ml  Net 224.16 ml   Filed Weights   03/23/17 1736 03/25/17 0705 03/25/17 1135  Weight: 53 kg (116 lb 13.5 oz) 51.6 kg (113 lb 12.1 oz) 50.4 kg (111 lb 1.8 oz)    Examination: General appearance: elderly male, small built, frail, chronically ill looking, lying comfortably in bed undergoing HD this morning. Respiratory system: Poor inspiratory effort. Seems clear to auscultation. No increased work of breathing. Cardiovascular system: S1 and S2 heard, regular tachycardic. No JVD, murmurs or pedal edema. Abdominal exam: Nondistended, soft and nontender. Normal bowel sounds heard. CNS: Alert, tracking with eyes but nonverbal and does not follow instructions. Extremities: Not seen moving much.      Data Reviewed: I have personally reviewed  following labs and imaging studies:  CBC: Recent Labs  Lab 03/19/17 1759 03/19/17 1825 03/20/17 0246 03/22/17 1054 03/23/17 0448 03/25/17 0720  WBC 4.1  --  5.3 13.9* 16.7* 13.9*  NEUTROABS 2.3  --   --   --   --   --   HGB 12.5* 13.9 11.9* 13.6 12.9* 11.1*  HCT 37.9* 41.0 37.3* 40.9 39.9 33.9*  MCV 93.8  --  92.6 93.8 94.3 94.4  PLT 197  --  219 224 221 580   Basic Metabolic Panel: Recent Labs  Lab 03/19/17 1759 03/19/17 1825 03/20/17 0246 03/20/17 2300 03/22/17 1054 03/23/17 0448 03/25/17 0720  NA 141 141 138  --  135 139 141  K 4.9 4.7 4.0  --  5.2* 5.1 4.3  CL 100* 99* 99*  --  96* 96* 101  CO2 28  --  26  --  23 20* 23  GLUCOSE 199* 195* 102*  --  162* 139* 152*  BUN 31* 33* 36*  --  46* 59* 68*  CREATININE 9.91* 9.60* 10.59*  --  10.93* 12.79* 10.93*  CALCIUM 9.4  --  9.2  --  9.5 9.1 8.7*  PHOS  --   --   --  1.6*  --   --  6.9*   GFR: Estimated Creatinine Clearance: 4.5 mL/min (A) (by C-G formula based on SCr of 10.93 mg/dL (H)). Liver Function Tests: Recent Labs  Lab 03/19/17 1759 03/20/17 0246 03/22/17 1054 03/25/17 0720  AST 12* 7* 12*  --   ALT 10* 8* 8*  --   ALKPHOS 90 89 97  --   BILITOT 0.6 0.8 1.2  --   PROT 7.8 7.4 8.5*  --   ALBUMIN 3.7 3.7 3.8 2.9*   No results for input(s): LIPASE, AMYLASE in the last 168 hours. Recent Labs  Lab 03/20/17 0246  AMMONIA 23   Coagulation Profile: Recent Labs  Lab 03/19/17 1759  INR 1.09   Cardiac Enzymes: No results for input(s): CKTOTAL, CKMB, CKMBINDEX, TROPONINI in the last 168 hours. BNP (last 3 results) Recent Labs    07/09/16 1438 07/31/16 1104 08/19/16 1155  PROBNP 1,044.0* 343.0* 349.0*   HbA1C: No results for input(s): HGBA1C in the last 72 hours. CBG: Recent Labs  Lab 03/23/17 0730 03/23/17 1117 03/24/17 0749 03/24/17 2333 03/25/17 0623  GLUCAP 186* 206* 205* 143* 149*    Recent Results (from the past 240 hour(s))  MRSA PCR Screening     Status: None   Collection  Time: 03/20/17 11:59 PM  Result Value Ref Range Status   MRSA by PCR NEGATIVE NEGATIVE Final    Comment:        The GeneXpert MRSA Assay (FDA approved for NASAL specimens only), is one component of a comprehensive MRSA colonization surveillance program. It is not intended to diagnose MRSA infection nor to guide or monitor treatment for MRSA infections.   Culture, blood (routine x 2)     Status: None (Preliminary result)   Collection Time: 03/22/17  2:15 PM  Result Value Ref Range Status   Specimen Description BLOOD RIGHT HAND  Final   Special Requests   Final    BOTTLES DRAWN AEROBIC ONLY Blood Culture results may not be optimal due to an inadequate volume of blood received in culture bottles   Culture NO GROWTH 3 DAYS  Final   Report Status PENDING  Incomplete  Culture, blood (routine x 2)     Status: None (Preliminary result)   Collection Time: 03/22/17  2:20 PM  Result Value Ref Range Status   Specimen Description BLOOD RIGHT HAND  Final   Special Requests   Final    BOTTLES DRAWN AEROBIC ONLY Blood Culture adequate volume   Culture NO GROWTH 3 DAYS  Final   Report Status PENDING  Incomplete         Radiology Studies: No results found.      Scheduled Meds: . aspirin  300 mg Rectal Daily   Or  . aspirin  325 mg Oral Daily  . atorvastatin  80 mg Oral q1800  . carvedilol  3.125 mg Oral BID WC  . dronabinol  2.5 mg Oral QAC lunch  . feeding supplement (ENSURE ENLIVE)  237 mL Oral BID BM   Continuous Infusions: . sodium chloride 50 mL/hr at 03/24/17 1446  . sodium chloride 50 mL/hr at 03/25/17 1400     LOS: 6 days    Time spent: 25 minutes    Vernell Leep, MD, [place], [employer]. [facility] Pager [phone]  If 7PM-7AM, please contact night-coverage [email or web address] Password [facility] 03/25/2017, 4:37 PM

## 2017-03-25 NOTE — Procedures (Signed)
[facility] ROUNDING NOTE   Subjective:   Interval History:Acute CVA MWF dialysis [facility]   Stopped antibiotics   Seen on dialysis and family want to continue the current treatment plan. They do not want to stop dialysis    Objective:  Vital signs in last 24 hours:  Temp:  [98.7 F (37.1 C)-100 F (37.8 C)] 98.7 F (37.1 C) (01/09 0705) Pulse Rate:  [99-118] 104 (01/09 0902) Resp:  [18-20] 18 (01/09 0705) BP: (124-152)/(67-81) 136/72 (01/09 0902) SpO2:  [92 %-100 %] 97 % (01/09 0705) Weight:  [113 lb 12.1 oz (51.6 kg)] 113 lb 12.1 oz (51.6 kg) (01/09 0705)  Weight change:  Filed Weights   03/23/17 1345 03/23/17 1736 03/25/17 0705  Weight: 117 lb 8.1 oz (53.3 kg) 116 lb 13.5 oz (53 kg) 113 lb 12.1 oz (51.6 kg)    Intake/Output: I/O last 3 completed shifts: In: 1522.5 [I.V.:1522.5] Out: -    Intake/Output this shift:  No intake/output data recorded.  CVS- RRR RS- CTA ABD- BS present soft non-distended EXT- no edema  AVF   Basic Metabolic Panel: Recent Labs  Lab 03/19/17 1759 03/19/17 1825 03/20/17 0246 03/20/17 2300 03/22/17 1054 03/23/17 0448 03/25/17 0720  NA 141 141 138  --  135 139 141  K 4.9 4.7 4.0  --  5.2* 5.1 4.3  CL 100* 99* 99*  --  96* 96* 101  CO2 28  --  26  --  23 20* 23  GLUCOSE 199* 195* 102*  --  162* 139* 152*  BUN 31* 33* 36*  --  46* 59* 68*  CREATININE 9.91* 9.60* 10.59*  --  10.93* 12.79* 10.93*  CALCIUM 9.4  --  9.2  --  9.5 9.1 8.7*  PHOS  --   --   --  1.6*  --   --  6.9*    Liver Function Tests: Recent Labs  Lab 03/19/17 1759 03/20/17 0246 03/22/17 1054 03/25/17 0720  AST 12* 7* 12*  --   ALT 10* 8* 8*  --   ALKPHOS 90 89 97  --   BILITOT 0.6 0.8 1.2  --   PROT 7.8 7.4 8.5*  --   ALBUMIN 3.7 3.7 3.8 2.9*   No results for input(s): LIPASE, AMYLASE in the last 168 hours. Recent Labs  Lab 03/20/17 0246  AMMONIA 23    CBC: Recent Labs  Lab 03/19/17 1759 03/19/17 1825 03/20/17 0246  03/22/17 1054 03/23/17 0448 03/25/17 0720  WBC 4.1  --  5.3 13.9* 16.7* 13.9*  NEUTROABS 2.3  --   --   --   --   --   HGB 12.5* 13.9 11.9* 13.6 12.9* 11.1*  HCT 37.9* 41.0 37.3* 40.9 39.9 33.9*  MCV 93.8  --  92.6 93.8 94.3 94.4  PLT 197  --  219 224 221 211    Cardiac Enzymes: No results for input(s): CKTOTAL, CKMB, CKMBINDEX, TROPONINI in the last 168 hours.  BNP: Invalid input(s): POCBNP  CBG: Recent Labs  Lab 03/23/17 0730 03/23/17 1117 03/24/17 0749 03/24/17 2333 03/25/17 0623  GLUCAP 186* 206* 205* 143* 149*    Microbiology: Results for orders placed or performed during the hospital encounter of 03/19/17  MRSA PCR Screening     Status: None   Collection Time: 03/20/17 11:59 PM  Result Value Ref Range Status   MRSA by PCR NEGATIVE NEGATIVE Final    Comment:        The GeneXpert MRSA Assay (FDA approved  for NASAL specimens only), is one component of a comprehensive MRSA colonization surveillance program. It is not intended to diagnose MRSA infection nor to guide or monitor treatment for MRSA infections.   Culture, blood (routine x 2)     Status: None (Preliminary result)   Collection Time: 03/22/17  2:15 PM  Result Value Ref Range Status   Specimen Description BLOOD RIGHT HAND  Final   Special Requests   Final    BOTTLES DRAWN AEROBIC ONLY Blood Culture results may not be optimal due to an inadequate volume of blood received in culture bottles   Culture NO GROWTH 2 DAYS  Final   Report Status PENDING  Incomplete  Culture, blood (routine x 2)     Status: None (Preliminary result)   Collection Time: 03/22/17  2:20 PM  Result Value Ref Range Status   Specimen Description BLOOD RIGHT HAND  Final   Special Requests   Final    BOTTLES DRAWN AEROBIC ONLY Blood Culture adequate volume   Culture NO GROWTH 2 DAYS  Final   Report Status PENDING  Incomplete    Coagulation Studies: No results for input(s): LABPROT, INR in the last 72 hours.  Urinalysis: No  results for input(s): COLORURINE, LABSPEC, PHURINE, GLUCOSEU, HGBUR, BILIRUBINUR, KETONESUR, PROTEINUR, UROBILINOGEN, NITRITE, LEUKOCYTESUR in the last 72 hours.  Invalid input(s): APPERANCEUR    Imaging: No results found.   Medications:   . sodium chloride    . sodium chloride    . sodium chloride 50 mL/hr at 03/24/17 1446  . sodium chloride 50 mL/hr at 03/24/17 1445   . aspirin  300 mg Rectal Daily   Or  . aspirin  325 mg Oral Daily  . atorvastatin  80 mg Oral q1800  . carvedilol  3.125 mg Oral BID WC  . dronabinol  2.5 mg Oral QAC lunch  . feeding supplement (ENSURE ENLIVE)  237 mL Oral BID BM   sodium chloride, sodium chloride, acetaminophen **OR** acetaminophen, alteplase, antiseptic oral rinse, heparin, lidocaine (PF), lidocaine-prilocaine, pentafluoroprop-tetrafluoroeth, polyvinyl alcohol  Assessment/ Plan:  1. ESRD MWF [facility] 2. New Small CerebellarischemicCVA 3. AMS with Dementia 4. Progressive decline/FTT   May need feeding tube  5. Persistent tachycardia 6. Traumatic SDH 01/2017 seen at [facility] 7. Chronic sCHF 8. CAD 9. HTN, stable 10. Anemia, stable 11. CKD BMD: P - 6.9  No binders  No oral intake  12. ? Seizures   continue dialysis and will need SELECT or KINDRED placement  - nutrition is going to have to be addressed at some point    LOS: 6 [place] W @TODAY @9 :38 AM

## 2017-03-25 NOTE — Progress Notes (Signed)
Initial Nutrition Assessment  DOCUMENTATION CODES:   Underweight, Severe malnutrition in context of chronic illness  INTERVENTION:  Monitor GOC   NUTRITION DIAGNOSIS:   Severe Malnutrition related to chronic illness as evidenced by severe muscle depletion, severe fat depletion, energy intake < 75% for > or equal to 1 month  GOAL:   Patient will meet greater than or equal to 90% of their needs  MONITOR:   PO intake, I & O's, Labs, Weight trends  REASON FOR ASSESSMENT:   Consult Assessment of nutrition requirement/status  ASSESSMENT:   [name] is a 72 yo M with ESRD on MWF HD, HTN, DM and recent traumatic subdural hematoma, not evacuated who presents with stroke.  Now has failure to thrive.  Hospice referral was recommended, family/POA have requested continued aggressive care and hospice plans were canceled. However on 1/9, after discussing with IR team, family more agreeable to meet with palliative care team.  Spoke with family at bedside who report patient had better appetite PTA, was eating as much as he could. Family is unclear about his intake, but states that over the past year he has lost weight from 165 pounds to approximately 120 pounds due to decreased overall intake. Per chart, it appears patient was 137 pounds as recent as 11/06/2016. With current weight of 111 pounds, patient exhibits a 26 pound/19% severe weight loss over 5 months. He is not able to take PO at this time. Patient was comfort measures upon admission but this was reversed. PEG was scheduled to be placed until IR discussed more with family how feeding tube will not improve patient's QOL. Palliative meeting tonight. Dialyzed today.  Labs reviewed:  Phos 6.9, BUN 68/Creatinine 10.93  Medications reviewed and include:  Dronabinol NS at 22mL/hr  NUTRITION - FOCUSED PHYSICAL EXAM:    Most Recent Value  Orbital Region  Severe depletion  Upper Arm Region  Severe depletion  Thoracic and Lumbar Region   Severe depletion  Buccal Region  Severe depletion  Temple Region  Severe depletion  Clavicle Bone Region  Severe depletion  Clavicle and Acromion Bone Region  Severe depletion  Scapular Bone Region  Severe depletion  Dorsal Hand  Severe depletion  Patellar Region  Severe depletion  Anterior Thigh Region  Severe depletion  Posterior Calf Region  Severe depletion  Edema (RD Assessment)  None  Hair  Reviewed  Eyes  Reviewed  Mouth  Reviewed  Skin  Reviewed  Nails  Reviewed       Diet Order:  Diet regular Room service appropriate? Yes; Fluid consistency: Thin  EDUCATION NEEDS:   Not appropriate for education at this time  Skin:  Skin Assessment: Skin Integrity Issues: Skin Integrity Issues:: Stage I Stage I: to coccyx  Last BM:  03/25/2016 (Type 7)  Height:   Ht Readings from Last 1 Encounters:  03/20/17 5\' 7"  (1.702 m)    Weight:   Wt Readings from Last 1 Encounters:  03/25/17 111 lb 1.8 oz (50.4 kg)    Ideal Body Weight:  67.27 kg  BMI:  Body mass index is 17.4 kg/m.  Estimated Nutritional Needs:   Kcal:  1700-1850 calories  Protein:  75-86 grams (1.5-1.7g/kg)  Fluid:  UOP +1L  Satira Anis. Kesia Dalto, MS, RD LDN Inpatient Clinical Dietitian Pager [phone]

## 2017-03-25 NOTE — Progress Notes (Signed)
IR aware of request for discussion regarding g-tube placement.  Patient has multiple medical problems with recent fall and SDH and now admitted secondary to CVA.  He is on HD for ESRD.  He is mostly bedbound per the family and not able to have meaningful conversations, etc.  He has been diagnosed with FTT.  He was initially made comfort care several days ago and then this was reversed.  IR has been asked to see to discuss g-tube placement.  Anatomically the patient is a candidate for g-tube placement, but I discussed with the wife and son, and daughter on the phone, that in their loved one's situation a g-tube will almost certainly not miraculously make him better.  We discussed how a g-tube is placed along with the risks and complications that can come from g-tube placement.  They understand these things.  The son's expectations of a g-tube are to give his father nutrition, in turn energy, and ultimately this will make him better.  We discussed that this is not likely because of his father's comorbidities.  We discussed g-tubes are very beneficial for that type of expectation when used for short term things such as cancer patients while undergoing radiation therapy or trauma patient's who are on the ventilator who need nutritional assistance until they are able to breath on their own again.  However, in patients with FTT,  g-tubes give nutrition which tends to prolong life, but not increase QOL.  Further questions were asked, but I referred the family to palliative care and highly recommended that they needed to consider re-meeting with PCM to help them in making these important decisions for their loved one and that they would have even more information regarding outcomes, etc than myself.  The wife and son were very appreciative of my time and stated that they would give "Kasie, the NP with palliative care a call maybe."  I called Jinny Blossom, who is covering PCM today, a call to give her an update and head's up that  the family is considering rediscussing options.  We will chart check and check back with the family in the coming days to see what they have decided as this is not an urgent procedure to rush into.  Henreitta Cea 3:40 PM 03/25/2017

## 2017-03-26 ENCOUNTER — Telehealth: Payer: Self-pay | Admitting: Medical

## 2017-03-26 ENCOUNTER — Inpatient Hospital Stay (HOSPITAL_COMMUNITY): Payer: Medicare Other

## 2017-03-26 DIAGNOSIS — E43 Unspecified severe protein-calorie malnutrition: Secondary | ICD-10-CM

## 2017-03-26 LAB — GLUCOSE, CAPILLARY
GLUCOSE-CAPILLARY: 125 mg/dL — AB (ref 65–99)
Glucose-Capillary: 127 mg/dL — ABNORMAL HIGH (ref 65–99)
Glucose-Capillary: 106 mg/dL — ABNORMAL HIGH (ref 65–99)
Glucose-Capillary: 119 mg/dL — ABNORMAL HIGH (ref 65–99)

## 2017-03-26 MED ORDER — THIAMINE HCL 100 MG/ML IJ SOLN
100.0000 mg | Freq: Every day | INTRAMUSCULAR | Status: DC
  Administered 2017-03-26: 100 mg via INTRAVENOUS
  Filled 2017-03-26: qty 2

## 2017-03-26 NOTE — Care Management Important Message (Signed)
Important Message  Patient Details  Name: [name] MRN: [id number] Date of Birth: [birth year]   Medicare Important Message Given:  Yes    Nathen May 03/26/2017, 11:56 AM

## 2017-03-26 NOTE — Telephone Encounter (Signed)
I did call back patient's wife and was able to talk with her.  She expressed being upset with the fact that neurology was suggesting/recommending palliative care/hospice.  It appears he was not eating and was not talking much at all.  Then this afternoon he started to interact more per wife and he actually asked to eat ice cream.  Per wife he was successful in eating that.  The wife is more optimistic now.  She did express some regret about taking him to the hospital the other day when I advised her.  I did explain to her that with a new finding on MRI taking him it was necessary.  After explaining that she expressed some understanding.  Wife does have my cell phone number if she needs to contact me.  I will be in the office tomorrow.  I did explain to her she could text me and let me know calling.  Otherwise I will think she telemarketer.

## 2017-03-26 NOTE — Progress Notes (Signed)
SLP Cancellation Note  Patient Details Name: [name] MRN: [id number] DOB: 1945-06-14   Cancelled treatment:       Reason Eval/Treat Not Completed: Patient at procedure or test/unavailable . Discussed with family who report that although patient placed on a regular diet, thin liquid by palliative team, they are not willing to feed patient with known risk of aspiration, feel that patient is improving and would like to be seen by SLP prior to resuming a po diet. Will plan to f/u in am 1/11.   [place], CCC-SLP [phone]   Gabriel Rainwater Meryl 03/26/2017, 3:55 PM

## 2017-03-26 NOTE — Procedures (Signed)
[facility] ROUNDING NOTE   Subjective:   Interval History:Acute CVA MWF dialysis [facility]   Stopped antibiotics   Seen on dialysis and family want to continue the current treatment plan. They do not want to stop dialysis  Appears a little more interactive today  Will plan dialysis     Objective:  Vital signs in last 24 hours:  Temp:  [97.5 F (36.4 C)-97.9 F (36.6 C)] 97.9 F (36.6 C) (01/10 0423) Pulse Rate:  [105-112] 108 (01/10 0423) Resp:  [20-110] 20 (01/10 0423) BP: (115-134)/(55-76) 115/55 (01/10 0423) SpO2:  [96 %-98 %] 98 % (01/10 0423) Weight:  [111 lb 1.8 oz (50.4 kg)] 111 lb 1.8 oz (50.4 kg) (01/09 1135)  Weight change:  Filed Weights   03/23/17 1736 03/25/17 0705 03/25/17 1135  Weight: 116 lb 13.5 oz (53 kg) 113 lb 12.1 oz (51.6 kg) 111 lb 1.8 oz (50.4 kg)    Intake/Output: I/O last 3 completed shifts: In: 2886.7 [I.V.:2886.7] Out: 1000 [Other:1000]   Intake/Output this shift:  No intake/output data recorded.  CVS- RRR RS- CTA ABD- BS present soft non-distended EXT- no edema  AVF   Basic Metabolic Panel: Recent Labs  Lab 03/19/17 1759 03/19/17 1825 03/20/17 0246 03/20/17 2300 03/22/17 1054 03/23/17 0448 03/25/17 0720  NA 141 141 138  --  135 139 141  K 4.9 4.7 4.0  --  5.2* 5.1 4.3  CL 100* 99* 99*  --  96* 96* 101  CO2 28  --  26  --  23 20* 23  GLUCOSE 199* 195* 102*  --  162* 139* 152*  BUN 31* 33* 36*  --  46* 59* 68*  CREATININE 9.91* 9.60* 10.59*  --  10.93* 12.79* 10.93*  CALCIUM 9.4  --  9.2  --  9.5 9.1 8.7*  PHOS  --   --   --  1.6*  --   --  6.9*    Liver Function Tests: Recent Labs  Lab 03/19/17 1759 03/20/17 0246 03/22/17 1054 03/25/17 0720  AST 12* 7* 12*  --   ALT 10* 8* 8*  --   ALKPHOS 90 89 97  --   BILITOT 0.6 0.8 1.2  --   PROT 7.8 7.4 8.5*  --   ALBUMIN 3.7 3.7 3.8 2.9*   No results for input(s): LIPASE, AMYLASE in the last 168 hours. Recent Labs  Lab 03/20/17 0246  AMMONIA 23     CBC: Recent Labs  Lab 03/19/17 1759 03/19/17 1825 03/20/17 0246 03/22/17 1054 03/23/17 0448 03/25/17 0720  WBC 4.1  --  5.3 13.9* 16.7* 13.9*  NEUTROABS 2.3  --   --   --   --   --   HGB 12.5* 13.9 11.9* 13.6 12.9* 11.1*  HCT 37.9* 41.0 37.3* 40.9 39.9 33.9*  MCV 93.8  --  92.6 93.8 94.3 94.4  PLT 197  --  219 224 221 211    Cardiac Enzymes: No results for input(s): CKTOTAL, CKMB, CKMBINDEX, TROPONINI in the last 168 hours.  BNP: Invalid input(s): POCBNP  CBG: Recent Labs  Lab 03/24/17 0749 03/24/17 2333 03/25/17 0623 03/25/17 2158 03/26/17 0609  GLUCAP 205* 143* 149* 106* 127*    Microbiology: Results for orders placed or performed during the hospital encounter of 03/19/17  MRSA PCR Screening     Status: None   Collection Time: 03/20/17 11:59 PM  Result Value Ref Range Status   MRSA by PCR NEGATIVE NEGATIVE Final    Comment:  The GeneXpert MRSA Assay (FDA approved for NASAL specimens only), is one component of a comprehensive MRSA colonization surveillance program. It is not intended to diagnose MRSA infection nor to guide or monitor treatment for MRSA infections.   Culture, blood (routine x 2)     Status: None (Preliminary result)   Collection Time: 03/22/17  2:15 PM  Result Value Ref Range Status   Specimen Description BLOOD RIGHT HAND  Final   Special Requests   Final    BOTTLES DRAWN AEROBIC ONLY Blood Culture results may not be optimal due to an inadequate volume of blood received in culture bottles   Culture NO GROWTH 3 DAYS  Final   Report Status PENDING  Incomplete  Culture, blood (routine x 2)     Status: None (Preliminary result)   Collection Time: 03/22/17  2:20 PM  Result Value Ref Range Status   Specimen Description BLOOD RIGHT HAND  Final   Special Requests   Final    BOTTLES DRAWN AEROBIC ONLY Blood Culture adequate volume   Culture NO GROWTH 3 DAYS  Final   Report Status PENDING  Incomplete    Coagulation Studies: No  results for input(s): LABPROT, INR in the last 72 hours.  Urinalysis: No results for input(s): COLORURINE, LABSPEC, PHURINE, GLUCOSEU, HGBUR, BILIRUBINUR, KETONESUR, PROTEINUR, UROBILINOGEN, NITRITE, LEUKOCYTESUR in the last 72 hours.  Invalid input(s): APPERANCEUR    Imaging: No results found.   Medications:   . sodium chloride 50 mL/hr at 03/24/17 1446  . sodium chloride 50 mL/hr at 03/25/17 1953   . aspirin  300 mg Rectal Daily   Or  . aspirin  325 mg Oral Daily  . atorvastatin  80 mg Oral q1800  . carvedilol  3.125 mg Oral BID WC  . dronabinol  2.5 mg Oral QAC lunch  . feeding supplement (ENSURE ENLIVE)  237 mL Oral BID BM   acetaminophen **OR** acetaminophen, antiseptic oral rinse, polyvinyl alcohol  Assessment/ Plan:  1. ESRD MWF [facility] 2. New Small CerebellarischemicCVA 3. AMS with Dementia 4. Progressive decline/FTT   May need feeding tube  5. Persistent tachycardia 6. Traumatic SDH 01/2017 [facility] 7. Chronic sCHF 8. CAD 9. HTN, stable 10. Anemia, stable 11. CKD BMD: P - 6.9  No binders  No oral intake  12. ? Seizures   continue dialysis and will need [facility] or [facility] placement  - nutrition is going to have to be addressed at some point    LOS: 7 Nyasha Rahilly W @TODAY @9 :40 AM

## 2017-03-26 NOTE — Progress Notes (Signed)
PROGRESS NOTE    [name]  HQI:[id number] DOB: 1945/03/31 DOA: 03/19/2017 PCP: Troy Pai, PA-C      Brief Narrative:  [name] is a 72 yo M with ESRD on MWF HD, HTN, DM and recent traumatic subdural hematoma, not evacuated who presents with stroke.  Now has failure to thrive.  Hospice referral was recommended, family/POA have requested continued aggressive care and hospice plans were canceled. However on 1/9, after discussing with IR team, family more agreeable to meet with palliative care team. Family requested formal Neurology consultation on 1/10. They are still contemplating between LTAC versus palliative care.    Assessment & Plan:  Principal Problem:   Acute CVA (cerebrovascular accident) ([place]) Active Problems:   Anemia of renal disease   ESRD (end stage renal disease) ([place])   Acute encephalopathy   DM (diabetes mellitus), type 2 with renal complications ([name])   Advance care planning   Goals of care, counseling/discussion   Palliative care by specialist   Leukocytosis   Tobacco abuse   History of prostate cancer   Benign prostatic hyperplasia   Protein-calorie malnutrition, severe      Encephalopathy Delirium Failure to thrive See Dr. Sabino Hill note from 1/6.  This was not the patient's presenting complaint, but has become the central concern.  The patient has had a decline since mid year last year (even before his SDH, he was declining, had seen Neurology in June and then October, noted to have dementia, starting to need help with dressing, ADLs).    This has obviously accelerated since the SDH, to the point that in most of the notes from his Dec hostpialization here, and his office visit follow up, he was essentially not talking.  Wife relates that to her (in that interval), he would still say brief things, like "let's get something to eat", but this appears to be part of an overall pattern of decline that precedes the SDH --> but has clearly accelerated  after.  Appreciated Palliative Care recommendations, their input is appreciated.  His precise illness process is undefined, but he is clearly showing signs of failing to thrive and the dying process.    With regard to work up during this hospitalization: his encephalopathy is not from vascular injury (his BP has been good, his MRA brain on day 2 of hospitalization showed no severe intracranial stenosis, echocardiogram normal) unless new stroke is a contributor (MRI brain 03/20/17 showed 1 cm acute ischemic non-hemorrhagic infarct central left cerebellar hemisphere, near interval resolution of previously seen right posterior fossa subdural collection, advanced parenchymal volume loss and multiple chronic microhemorrhages throughout the brain that showed however this stroke less likely to have caused his overall decline by itself); there is no evidence of infection, no localizing signs, pneumonia, fever, nor bacteremia as well as normal HIV and RPR during last admission for encephalopathy; there is no known cancer; this is not a medication induced encephalopathy: he has been off medications of all kinds and undergone dialysis during this hospitalization without change; EEG was refused by patient this time, but was negative during last hospitalziation for encephaloapthy; there is no new trauma (although old traumatic SDH is clearly contributing); this is not endocrine or metabolic as his TSH is normal, electrolytes are normal and been dialyzed, B12 and ammonia was normal during last hospitalization, and phosphate is only moderately low  - As of 1/9: IR team met with family regarding PEG tube but they seem reluctant after being made aware that this may  not fix his overall condition. They are planning to meet with palliative care regarding goals of care discussion. - I have discussed extensively with patient's spouse last evening and today. Son was not present at that time. Requested speech therapy to evaluate  swallow. Patient asking for something to eat. Subsequently son came by and requested formal Neurology consult which was requested. I have asked palliative care team to assess him again and meet with family.  Acute Stroke -Continue daily aspirin, statin, Coreg if he takes them. Requested speech therapy to assess regarding swallowing.  Tachycardia Leukocytosis There are no localizing signs of infection.  ESRD -Consult to Nephrology for routine HD. Discussed with Dr. Justin Hill who agrees that palliative care is appropriate for this patient.  Diabetes This is diet controlled per notes. -SSI q4hrs, low threshold to discontinue  Anemia of renal disease Stable around baseline ~12.  Other medications -May continue venlafaxine and ranitidine and dronabinol if willing/able to take       DVT prophylaxis: SCDs Code Status: DO NOT RESUSCITATE Family Communication: Discussed in detail with patient's spouse at bedside. Updated care and answered questions. Disposition Plan: Pending input after palliative care meeting.    Consultants:   Neurology  Nephrology  Palliative Care  Procedures:   MRI brain  MRA head  Carotid US  Echocardiogram     Subjective: Patient mostly sleeping but briefly opens eyes and drifts back to sleep. Remained nonverbal. As per spouse, spoke sentence or 2 last night when he was faced timing with his granddaughter in [place]. He also "blew a case" to his wife last night and this morning. He apparently was asking for something to eat.  Objective: Vitals:   03/26/17 0100 03/26/17 0423 03/26/17 0948 03/26/17 1402  BP: 120/62 (!) 115/55 132/69 137/74  Pulse: (!) 105 (!) 108 (!) 108 (!) 105  Resp: 20 20 20 20   Temp: 97.8 F (36.6 C) 97.9 F (36.6 C) 98.5 F (36.9 C) 98.6 F (37 C)  TempSrc: Axillary Axillary Axillary Axillary  SpO2: 97% 98% 99% 100%  Weight:      Height:        Intake/Output Summary (Last 24 hours) at 03/26/2017 1547 Last data filed  at 03/26/2017 0300 Gross per 24 hour  Intake 2050 ml  Output -  Net 2050 ml   Filed Weights   03/23/17 1736 03/25/17 0705 03/25/17 1135  Weight: 53 kg (116 lb 13.5 oz) 51.6 kg (113 lb 12.1 oz) 50.4 kg (111 lb 1.8 oz)    Examination: General appearance: elderly male, small built, frail, chronically ill looking, lying comfortably in bed. Does not appear in any distress. Respiratory system: Poor inspiratory effort. Seems clear to auscultation. No increased work of breathing. Stable without change. Cardiovascular system: S1 and S2 heard, regular tachycardic. No JVD, murmurs or pedal edema. Stable without change. Abdominal exam: Nondistended, soft and nontender. Normal bowel sounds heard. Stable without change. CNS: Mental status as above. Nonverbal and does not follow instructions. Extremities: Not seen moving much.      Data Reviewed: I have personally reviewed following labs and imaging studies:  CBC: Recent Labs  Lab 03/19/17 1759 03/19/17 1825 03/20/17 0246 03/22/17 1054 03/23/17 0448 03/25/17 0720  WBC 4.1  --  5.3 13.9* 16.7* 13.9*  NEUTROABS 2.3  --   --   --   --   --   HGB 12.5* 13.9 11.9* 13.6 12.9* 11.1*  HCT 37.9* 41.0 37.3* 40.9 39.9 33.9*  MCV 93.8  --  92.6 93.8 94.3 94.4  PLT 197  --  219 224 221 542   Basic Metabolic Panel: Recent Labs  Lab 03/19/17 1759 03/19/17 1825 03/20/17 0246 03/20/17 2300 03/22/17 1054 03/23/17 0448 03/25/17 0720  NA 141 141 138  --  135 139 141  K 4.9 4.7 4.0  --  5.2* 5.1 4.3  CL 100* 99* 99*  --  96* 96* 101  CO2 28  --  26  --  23 20* 23  GLUCOSE 199* 195* 102*  --  162* 139* 152*  BUN 31* 33* 36*  --  46* 59* 68*  CREATININE 9.91* 9.60* 10.59*  --  10.93* 12.79* 10.93*  CALCIUM 9.4  --  9.2  --  9.5 9.1 8.7*  PHOS  --   --   --  1.6*  --   --  6.9*   GFR: Estimated Creatinine Clearance: 4.5 mL/min (A) (by C-G formula based on SCr of 10.93 mg/dL (H)). Liver Function Tests: Recent Labs  Lab 03/19/17 1759  03/20/17 0246 03/22/17 1054 03/25/17 0720  AST 12* 7* 12*  --   ALT 10* 8* 8*  --   ALKPHOS 90 89 97  --   BILITOT 0.6 0.8 1.2  --   PROT 7.8 7.4 8.5*  --   ALBUMIN 3.7 3.7 3.8 2.9*   No results for input(s): LIPASE, AMYLASE in the last 168 hours. Recent Labs  Lab 03/20/17 0246  AMMONIA 23   Coagulation Profile: Recent Labs  Lab 03/19/17 1759  INR 1.09   Cardiac Enzymes: No results for input(s): CKTOTAL, CKMB, CKMBINDEX, TROPONINI in the last 168 hours. BNP (last 3 results) Recent Labs    07/09/16 1438 07/31/16 1104 08/19/16 1155  PROBNP 1,044.0* 343.0* 349.0*   HbA1C: No results for input(s): HGBA1C in the last 72 hours. CBG: Recent Labs  Lab 03/24/17 2333 03/25/17 0623 03/25/17 2158 03/26/17 0609 03/26/17 1149  GLUCAP 143* 149* 106* 127* 106*    Recent Results (from the past 240 hour(s))  MRSA PCR Screening     Status: None   Collection Time: 03/20/17 11:59 PM  Result Value Ref Range Status   MRSA by PCR NEGATIVE NEGATIVE Final    Comment:        The GeneXpert MRSA Assay (FDA approved for NASAL specimens only), is one component of a comprehensive MRSA colonization surveillance program. It is not intended to diagnose MRSA infection nor to guide or monitor treatment for MRSA infections.   Culture, blood (routine x 2)     Status: None (Preliminary result)   Collection Time: 03/22/17  2:15 PM  Result Value Ref Range Status   Specimen Description BLOOD RIGHT HAND  Final   Special Requests   Final    BOTTLES DRAWN AEROBIC ONLY Blood Culture results may not be optimal due to an inadequate volume of blood received in culture bottles   Culture NO GROWTH 4 DAYS  Final   Report Status PENDING  Incomplete  Culture, blood (routine x 2)     Status: None (Preliminary result)   Collection Time: 03/22/17  2:20 PM  Result Value Ref Range Status   Specimen Description BLOOD RIGHT HAND  Final   Special Requests   Final    BOTTLES DRAWN AEROBIC ONLY Blood  Culture adequate volume   Culture NO GROWTH 4 DAYS  Final   Report Status PENDING  Incomplete         Radiology Studies: No results found.  Scheduled Meds: . aspirin  300 mg Rectal Daily   Or  . aspirin  325 mg Oral Daily  . atorvastatin  80 mg Oral q1800  . carvedilol  3.125 mg Oral BID WC  . dronabinol  2.5 mg Oral QAC lunch  . feeding supplement (ENSURE ENLIVE)  237 mL Oral BID BM   Continuous Infusions: . sodium chloride 50 mL/hr at 03/24/17 1446  . sodium chloride 50 mL/hr at 03/25/17 1953     LOS: 7 days    Time spent: 25 minutes    Vernell Leep, MD, [place], [facility]. [facility] Pager [phone]  If 7PM-7AM, please contact night-coverage [email or web address] Password [facility] 03/26/2017, 3:47 PM

## 2017-03-26 NOTE — Progress Notes (Addendum)
After LOS meeting to discuss patients. CM met with the patient, his son and wife. CM informed them that [facility] does not have any beds to offer today. CM informed them that [facility] does have beds available. Son states they still are not sure the course they are going to take. They are waiting on ST to resee the patient and asked that Neurology be reconsulted. Dr Algis Liming aware of their request. Awaiting ST eval. Family having a hard time deciding whether to have pt d/c to hospice or LTACH.

## 2017-03-26 NOTE — Telephone Encounter (Signed)
Copied from [place] [phone]. Topic: Inquiry >> Mar 26, 2017 10:59 AM Malena Catholic I, NT wrote: Reason for CRM: Wife call and said Pt is in the hospital and she have some questions to ask Doctor Saguier please call wife a soon is possible [phone]

## 2017-03-26 NOTE — Progress Notes (Signed)
EEG Completed; Results Pending  

## 2017-03-26 NOTE — Consult Note (Signed)
NEURO HOSPITALIST CONSULT NOTE   Requestig physician: Dr. Algis Liming   Reason for Consult: new onset AMS since last admission   History obtained from:  Family  HPI:                                                                                                                                          [name] is an 72 y.o. male who recently suffered subdurals and was brought to physical therapy as an outpatient.  Patient was doing well after physical therapy.  Per family he was walking with a walker, he was talkative, he was slightly slower in his thinking however not significant.  He is able to have conversations with his son although his son did note that may be there was some cognitive decline as he would asked some questions that might of been an appropriate.  Patient was brought back to the hospital 1 week ago as he was found increasingly confused for 2 days.  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  Over the past week patient has remained confused, has shown failure to thrive, has been refusing to eat.  Per notes is mention that as far as function nutritional status he has declined significantly over the last several months-especially since his fall in November.  He has lost a significant amount of weight as he is down to 116 pounds from 134 in July.  Family has noted that he has had a memory decline since October per palliative care note.  EEG was obtained on his previous hospital visit which showed diffuse slowing of the waking background.  There was no epileptiform discharges at that time.  Thus far no reason has been found for patient to be such a profoundly declined and lethargic.  Has been discussed with family in depth about needing a J-tube and likely either LTAC or palliative care.  Per note patient would not want a feeding tube however family feels that he is still fighting and requested a second consult  from neurology to make sure there was nothing being missed before making the decision of palliative care.  Currently patient is in bed, patient goes in and out of sleep, with noxious stimuli patient awakens briefly he is confused and then easily falls back asleep.  If continued with noxious stimuli he does quickly and abruptly turn his head and say "if you do that again him on a punch you".  He does briskly withdraw from noxious stimuli.  Past Medical History:  Diagnosis Date  . 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 Anemia, iron deficiency 03/24/2011   a. Recurrent GI bleed, tx with periodic iron infusions.  . 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 AVM (  arteriovenous malformation) of duodenum, acquired    egds in 01/2012, 04/2011  . BPH (benign prostatic hyperplasia)   . CAD (coronary artery disease)    a. Cath 09/2012: moderate borderline CAD in mid LAD/small diagonal branch, mild RCA stenosis, to be managed medically   . Chronic lower back pain   . Diabetic peripheral neuropathy ([place]) 2014   foot pain.  . Essential hypertension 02/01/2012  . Fatty liver    on CT of 11/2010  . GERD (gastroesophageal reflux disease)   . GI bleed    a. Recurrent GI bleed, tx with IV iron. b. Per heme notes - likely AVMs 04/2012 (tx with cauterization several months ago).  . Hematuria    a. Urology note scan from 07/2012: cystoscopy without evidence for bladder lesion, only lateral hypertrophy of posterior urethra, bladder impression from BPH. b. Pt states he had "some tests" scheduled for later in July 2014.  . High cholesterol   . Hyperlipidemia 02/01/2012  . Hypertension   . Hypertensive urgency 07/06/2016  . 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 Orthostatic hypotension    a. Tx with florinef.  . Peripheral neuropathy   . Polyp, colonic    Colonoscopy 01/2012 "benign" polyp  . Prostate cancer ([place])   . Sleep apnea    "had mask; couldn't sleep in it" (09/20/2012)  . Smoker   .  SOB (shortness of breath) 07/06/2016  . Stage III chronic kidney disease ([name])    a. Stage 3 (DM with complications ->CKD, peripheral neuropathy).  . Stroke ([place])    x 2  . Symptomatic anemia 04/03/2015  . Tinea pedis 05/24/2012  . Tobacco use 02/01/2012  . Type 2 diabetes, uncontrolled, with neuropathy ([place]) 02/01/2012  . Type II diabetes mellitus ([place])    a. Dx 1994, uncontrolled.     Past Surgical History:  Procedure Laterality Date  . ENTEROSCOPY N/A 03/01/2013   Procedure: ENTEROSCOPY;  Surgeon: Inda Castle, MD;  Location: [place];  Service: Endoscopy;  Laterality: N/A;  . ENTEROSCOPY N/A 06/26/2016   Procedure: ENTEROSCOPY;  Surgeon: Milus Banister, MD;  Location: [place];  Service: Endoscopy;  Laterality: N/A;  this is a push enteroscopy  . ENTEROSCOPY N/A 10/23/2016   Procedure: ENTEROSCOPY;  Surgeon: Gatha Mayer, MD;  Location: [facility] ENDOSCOPY;  Service: Endoscopy;  Laterality: N/A;  . GIVENS CAPSULE STUDY N/A 04/05/2015   Procedure: GIVENS CAPSULE STUDY;  Surgeon: Gatha Mayer, MD;  Location: [place];  Service: Endoscopy;  Laterality: N/A;  . INGUINAL HERNIA REPAIR Right 2011  . LEFT HEART CATH AND CORONARY ANGIOGRAPHY N/A 12/23/2016   Procedure: LEFT HEART CATH AND CORONARY ANGIOGRAPHY;  Surgeon: Leonie Man, MD;  Location: [place] [facility];  Service: Cardiovascular;  Laterality: N/A;  . LEFT HEART CATHETERIZATION WITH CORONARY ANGIOGRAM N/A 09/21/2012   Procedure: LEFT HEART CATHETERIZATION WITH CORONARY ANGIOGRAM;  Surgeon: Peter M Martinique, MD;  Location: [facility] CATH LAB;  Service: Cardiovascular;  Laterality: N/A;  . LUMBAR DISC SURGERY  1980's  . LYMPHADENECTOMY Bilateral 01/19/2013   Procedure: LYMPHADENECTOMY;  Surgeon: Bernestine Amass, MD;  Location: [facility];  Service: Urology;  Laterality: Bilateral;  . ROBOT ASSISTED LAPAROSCOPIC RADICAL PROSTATECTOMY N/A 01/19/2013   Procedure: ROBOTIC ASSISTED LAPAROSCOPIC RADICAL PROSTATECTOMY;  Surgeon: Bernestine Amass, MD;  Location: [facility];  Service: Urology;  Laterality: N/A;  . SHOULDER OPEN ROTATOR CUFF REPAIR Left 1980's    Family History  Problem Relation Age of Onset  . Stroke  Father        Died at 39  . Diabetes Mother   . Heart attack Brother        Died at 6  . Diabetes Sister   . Stomach cancer Brother   . Heart attack Sister       Social History:  reports that he has been smoking cigarettes.  He started smoking about 48 years ago. He has a 15.00 pack-year smoking history. he has never used smokeless tobacco. He reports that he does not drink alcohol or use drugs.  No Known Allergies  MEDICATIONS:                                                                                                                     Scheduled: . aspirin  300 mg Rectal Daily   Or  . aspirin  325 mg Oral Daily  . atorvastatin  80 mg Oral q1800  . carvedilol  3.125 mg Oral BID WC  . dronabinol  2.5 mg Oral QAC lunch  . feeding supplement (ENSURE ENLIVE)  237 mL Oral BID BM   Continuous: . sodium chloride 50 mL/hr at 03/24/17 1446  . sodium chloride 50 mL/hr at 03/25/17 1953   [facility]:[id number] **OR** acetaminophen, antiseptic oral rinse, polyvinyl alcohol   ROS:                                                                                                                                       History obtained from unobtainable from patient due to mental status  General ROS: negative for - chills, fatigue, fever, night sweats, weight gain or weight loss Psychological ROS: negative for - behavioral disorder, hallucinations, memory difficulties, mood swings or suicidal ideation Ophthalmic ROS: negative for - blurry vision, double vision, eye pain or loss of vision ENT ROS: negative for - epistaxis, nasal discharge, oral lesions, sore throat, tinnitus or vertigo Allergy and Immunology ROS: negative for - hives or itchy/watery eyes Hematological and Lymphatic ROS: negative for - bleeding  problems, bruising or swollen lymph nodes Endocrine ROS: negative for - galactorrhea, hair pattern changes, polydipsia/polyuria or temperature intolerance Respiratory ROS: negative for - cough, hemoptysis, shortness of breath or wheezing Cardiovascular ROS: negative for - chest pain, dyspnea on exertion, edema or irregular heartbeat Gastrointestinal ROS: negative for - abdominal pain, diarrhea, hematemesis, nausea/vomiting or stool incontinence Genito-Urinary ROS: negative for - dysuria, hematuria, incontinence or urinary  frequency/urgency Musculoskeletal ROS: negative for - joint swelling or muscular weakness Neurological ROS: as noted in HPI Dermatological ROS: negative for rash and skin lesion changes   Blood pressure 132/69, pulse (!) 108, temperature 98.5 F (36.9 C), temperature source Axillary, resp. rate 20, height 5\' 7"  (1.702 m), weight 50.4 kg (111 lb 1.8 oz), SpO2 99 %.   Neurologic Examination:                                                                                                      HEENT-  Normocephalic, no lesions, without obvious abnormality.  Normal external eye and conjunctiva.  Normal TM's bilaterally.  Normal auditory canals and external ears. Normal external nose, mucus membranes and septum.  Normal pharynx. Cardiovascular- S1, S2 normal, pulses palpable throughout   Lungs- chest clear, no wheezing, rales, normal symmetric air entry Abdomen- normal findings: bowel sounds normal Extremities- no edema Lymph-no adenopathy palpable Musculoskeletal-no joint tenderness, deformity or swelling Skin-warm and dry, no hyperpigmentation, vitiligo, or suspicious lesions  Neurological Examination Mental Status: Very drowsy, not oriented to person or place however he does recognize his son. Speech fluent aphasia not seem to be present however he is very nonfocal and very drowsy.  Not follow any commands for me  cranial Nerves: II: Blinks to threat bilaterally III,IV,  VI: ptosis not present, extra-ocular motions intact bilaterally pupils equal, round, reactive to light and accommodation V,VII: Face symmetric, facial light touch sensation normal bilaterally VIII: hearing normal bilaterally IX,X: uvula rises symmetrically XI: bilateral shoulder shrug XII: midline tongue extension Motor: Moving all extremities equally and antigravity with good strength Sensory: Withdraws from noxious stimuli in all 4 extremities Deep Tendon Reflexes: 1+ and symmetric throughout without ankle jerk Plantars: Right: downgoing   Left: downgoing Cerebellar: Unable to assess Gait: Not tested      Lab Results: Basic Metabolic Panel: Recent Labs  Lab 03/19/17 1759 03/19/17 1825 03/20/17 0246 03/20/17 2300 03/22/17 1054 03/23/17 0448 03/25/17 0720  NA 141 141 138  --  135 139 141  K 4.9 4.7 4.0  --  5.2* 5.1 4.3  CL 100* 99* 99*  --  96* 96* 101  CO2 28  --  26  --  23 20* 23  GLUCOSE 199* 195* 102*  --  162* 139* 152*  BUN 31* 33* 36*  --  46* 59* 68*  CREATININE 9.91* 9.60* 10.59*  --  10.93* 12.79* 10.93*  CALCIUM 9.4  --  9.2  --  9.5 9.1 8.7*  PHOS  --   --   --  1.6*  --   --  6.9*    Liver Function Tests: Recent Labs  Lab 03/19/17 1759 03/20/17 0246 03/22/17 1054 03/25/17 0720  AST 12* 7* 12*  --   ALT 10* 8* 8*  --   ALKPHOS 90 89 97  --   BILITOT 0.6 0.8 1.2  --   PROT 7.8 7.4 8.5*  --   ALBUMIN 3.7 3.7 3.8 2.9*   No results for input(s): LIPASE, AMYLASE in the last 168 hours. Recent Labs  Lab 03/20/17 0246  AMMONIA 23    CBC: Recent Labs  Lab 03/19/17 1759 03/19/17 1825 03/20/17 0246 03/22/17 1054 03/23/17 0448 03/25/17 0720  WBC 4.1  --  5.3 13.9* 16.7* 13.9*  NEUTROABS 2.3  --   --   --   --   --   HGB 12.5* 13.9 11.9* 13.6 12.9* 11.1*  HCT 37.9* 41.0 37.3* 40.9 39.9 33.9*  MCV 93.8  --  92.6 93.8 94.3 94.4  PLT 197  --  219 224 221 211    Cardiac Enzymes: No results for input(s): CKTOTAL, CKMB, CKMBINDEX, TROPONINI  in the last 168 hours.  Lipid Panel: Recent Labs  Lab 03/20/17 0246  CHOL 208*  TRIG 181*  HDL 37*  CHOLHDL 5.6  VLDL 36  LDLCALC 135*    CBG: Recent Labs  Lab 03/24/17 2333 03/25/17 0623 03/25/17 2158 03/26/17 0609 03/26/17 1149  GLUCAP 143* 149* 106* 127* 106*    Microbiology: Results for orders placed or performed during the hospital encounter of 03/19/17  MRSA PCR Screening     Status: None   Collection Time: 03/20/17 11:59 PM  Result Value Ref Range Status   MRSA by PCR NEGATIVE NEGATIVE Final    Comment:        The GeneXpert MRSA Assay (FDA approved for NASAL specimens only), is one component of a comprehensive MRSA colonization surveillance program. It is not intended to diagnose MRSA infection nor to guide or monitor treatment for MRSA infections.   Culture, blood (routine x 2)     Status: None (Preliminary result)   Collection Time: 03/22/17  2:15 PM  Result Value Ref Range Status   Specimen Description BLOOD RIGHT HAND  Final   Special Requests   Final    BOTTLES DRAWN AEROBIC ONLY Blood Culture results may not be optimal due to an inadequate volume of blood received in culture bottles   Culture NO GROWTH 3 DAYS  Final   Report Status PENDING  Incomplete  Culture, blood (routine x 2)     Status: None (Preliminary result)   Collection Time: 03/22/17  2:20 PM  Result Value Ref Range Status   Specimen Description BLOOD RIGHT HAND  Final   Special Requests   Final    BOTTLES DRAWN AEROBIC ONLY Blood Culture adequate volume   Culture NO GROWTH 3 DAYS  Final   Report Status PENDING  Incomplete    Coagulation Studies: No results for input(s): LABPROT, INR in the last 72 hours.  Imaging: No results found.     Assessment and plan per attending neurologist  Etta Quill PA-C [facility] [phone]  03/26/2017, 12:49 PM   Assessment/Plan:  72 year old male with acute altered mental status in the setting of vascular dementia.  I  agree that the cerebellar infarct is not adequate to explain his overall confusion.  He has had progressive failure to thrive, but the abrupt change over the past week does make me think there may be some chance of recovery to the level of function that he was experiencing in between his December and January hospitalizations.  Also, he has had significant weight loss, one consideration could be thiamine deficiency as a contributor.  If he had had an embolic shower, this could explain his change in mental status.  I would also like to check an EEG to assess for seizures.  He has already had B12, TSH, ammonia checked which were all normal.  1) repeat MRI brain 2) EEG 3) check B1  and supplement thiamine 4) neurology will continue to follow  [name], MD [facility] [phone]  If 7pm- 7am, please page neurology on call as listed in [place].

## 2017-03-26 NOTE — Telephone Encounter (Signed)
I tried to call back patient's wife tonight.  Before I was leaving the office.  I did leave my cell phone number so she can call me tomorrow morning between 8 and 10 in the morning.  I will not be in the office tomorrow and presently my phone needs recharging.  I will try to call call her in about 5 more minutes before I leave the office.  I reviewed notes of hospital and [place]  is not doing well.  Wanted to answer/address wife's questions.

## 2017-03-27 LAB — RENAL FUNCTION PANEL
Albumin: 2.8 g/dL — ABNORMAL LOW (ref 3.5–5.0)
Anion gap: 18 — ABNORMAL HIGH (ref 5–15)
BUN: 71 mg/dL — AB (ref 6–20)
CO2: 20 mmol/L — AB (ref 22–32)
CREATININE: 10.17 mg/dL — AB (ref 0.61–1.24)
Calcium: 8.5 mg/dL — ABNORMAL LOW (ref 8.9–10.3)
Chloride: 103 mmol/L (ref 101–111)
GFR calc non Af Amer: 4 mL/min — ABNORMAL LOW (ref >60)
GFR, EST AFRICAN AMERICAN: 5 mL/min — AB (ref >60)
GLUCOSE: 118 mg/dL — AB (ref 65–99)
Phosphorus: 7.6 mg/dL — ABNORMAL HIGH (ref 2.5–4.6)
Potassium: 4.3 mmol/L (ref 3.5–5.1)
SODIUM: 141 mmol/L (ref 135–145)

## 2017-03-27 LAB — GLUCOSE, CAPILLARY
GLUCOSE-CAPILLARY: 104 mg/dL — AB (ref 65–99)
Glucose-Capillary: 88 mg/dL (ref 65–99)
Glucose-Capillary: 155 mg/dL — ABNORMAL HIGH (ref 65–99)
Glucose-Capillary: 133 mg/dL — ABNORMAL HIGH (ref 65–99)

## 2017-03-27 LAB — CULTURE, BLOOD (ROUTINE X 2)
CULTURE: NO GROWTH
CULTURE: NO GROWTH
Special Requests: ADEQUATE

## 2017-03-27 LAB — CBC
HCT: 33.4 % — ABNORMAL LOW (ref 39.0–52.0)
Hemoglobin: 10.9 g/dL — ABNORMAL LOW (ref 13.0–17.0)
MCH: 30.8 pg (ref 26.0–34.0)
MCHC: 32.6 g/dL (ref 30.0–36.0)
MCV: 94.4 fL (ref 78.0–100.0)
PLATELETS: 167 10*3/uL (ref 150–400)
RBC: 3.54 MIL/uL — AB (ref 4.22–5.81)
RDW: 18 % — AB (ref 11.5–15.5)
WBC: 8.1 10*3/uL (ref 4.0–10.5)

## 2017-03-27 MED ORDER — RESOURCE THICKENUP CLEAR PO POWD
ORAL | Status: DC | PRN
  Administered 2017-03-27: 23:00:00 via ORAL
  Filled 2017-03-27: qty 125

## 2017-03-27 MED ORDER — STARCH (THICKENING) PO POWD
ORAL | Status: DC | PRN
  Filled 2017-03-27: qty 227

## 2017-03-27 MED ORDER — SODIUM CHLORIDE 0.9 % IV SOLN: 100.0000 mL | INTRAVENOUS | Status: DC | PRN

## 2017-03-27 MED ORDER — ALTEPLASE 2 MG IJ SOLR: 2.0000 mg | Freq: Once | INTRAMUSCULAR | Status: DC | PRN

## 2017-03-27 MED ORDER — HEPARIN SODIUM (PORCINE) 1000 UNIT/ML DIALYSIS: 1000.0000 [IU] | INTRAMUSCULAR | Status: DC | PRN

## 2017-03-27 MED ORDER — VITAMIN B-1 100 MG PO TABS
100.0000 mg | ORAL_TABLET | Freq: Every day | ORAL | Status: DC
  Administered 2017-03-28 – 2017-03-30 (×3): 100 mg via ORAL
  Filled 2017-03-27 (×4): qty 1

## 2017-03-27 MED ORDER — LIDOCAINE-PRILOCAINE 2.5-2.5 % EX CREA: 1.0000 "application " | TOPICAL_CREAM | CUTANEOUS | Status: DC | PRN

## 2017-03-27 MED ORDER — LIDOCAINE HCL (PF) 1 % IJ SOLN: 5.0000 mL | INTRAMUSCULAR | Status: DC | PRN

## 2017-03-27 MED ORDER — PENTAFLUOROPROP-TETRAFLUOROETH EX AERO: 1.0000 "application " | INHALATION_SPRAY | CUTANEOUS | Status: DC | PRN

## 2017-03-27 NOTE — Procedures (Signed)
[facility] ROUNDING NOTE   Subjective:   Interval History:Acute CVA MWF dialysis [facility]   Stopped antibiotics   Seen on dialysis and family want to continue the current treatment plan. They do not want to stop dialysis  Patient seen on dialysis and tolerating well     Objective:  Vital signs in last 24 hours:  Temp:  [98.6 F (37 C)-99.1 F (37.3 C)] 99.1 F (37.3 C) (01/11 0741) Pulse Rate:  [96-119] 102 (01/11 1000) Resp:  [18-20] 18 (01/11 0741) BP: (94-144)/(50-99) 128/64 (01/11 1000) SpO2:  [94 %-100 %] 96 % (01/11 0741) Weight:  [114 lb 13.8 oz (52.1 kg)-125 lb 3.5 oz (56.8 kg)] 114 lb 13.8 oz (52.1 kg) (01/11 0741)  Weight change: 11 lb 7.4 oz (5.2 kg) Filed Weights   03/25/17 1135 03/27/17 0304 03/27/17 0741  Weight: 111 lb 1.8 oz (50.4 kg) 125 lb 3.5 oz (56.8 kg) 114 lb 13.8 oz (52.1 kg)    Intake/Output: I/O last 3 completed shifts: In: 1750 [I.V.:1750] Out: -    Intake/Output this shift:  No intake/output data recorded.  CVS- RRR RS- CTA ABD- BS present soft non-distended EXT- no edema  AVF   Basic Metabolic Panel: Recent Labs  Lab 03/20/17 2300  03/22/17 1054 03/23/17 0448 03/25/17 0720 03/27/17 0745  NA  --   --  135 139 141 141  K  --   --  5.2* 5.1 4.3 4.3  CL  --   --  96* 96* 101 103  CO2  --   --  23 20* 23 20*  GLUCOSE  --   --  162* 139* 152* 118*  BUN  --   --  46* 59* 68* 71*  CREATININE  --   --  10.93* 12.79* 10.93* 10.17*  CALCIUM  --    < > 9.5 9.1 8.7* 8.5*  PHOS 1.6*  --   --   --  6.9* 7.6*   < > = values in this interval not displayed.    Liver Function Tests: Recent Labs  Lab 03/22/17 1054 03/25/17 0720 03/27/17 0745  AST 12*  --   --   ALT 8*  --   --   ALKPHOS 97  --   --   BILITOT 1.2  --   --   PROT 8.5*  --   --   ALBUMIN 3.8 2.9* 2.8*   No results for input(s): LIPASE, AMYLASE in the last 168 hours. No results for input(s): AMMONIA in the last 168 hours.  CBC: Recent Labs  Lab  03/22/17 1054 03/23/17 0448 03/25/17 0720 03/27/17 0745  WBC 13.9* 16.7* 13.9* 8.1  HGB 13.6 12.9* 11.1* 10.9*  HCT 40.9 39.9 33.9* 33.4*  MCV 93.8 94.3 94.4 94.4  PLT 224 221 211 167    Cardiac Enzymes: No results for input(s): CKTOTAL, CKMB, CKMBINDEX, TROPONINI in the last 168 hours.  BNP: Invalid input(s): POCBNP  CBG: Recent Labs  Lab 03/26/17 0609 03/26/17 1149 03/26/17 1647 03/26/17 2121 03/27/17 0638  GLUCAP 127* 106* 119* 125* 104*    Microbiology: Results for orders placed or performed during the hospital encounter of 03/19/17  MRSA PCR Screening     Status: None   Collection Time: 03/20/17 11:59 PM  Result Value Ref Range Status   MRSA by PCR NEGATIVE NEGATIVE Final    Comment:        The GeneXpert MRSA Assay (FDA approved for NASAL specimens only), is one component of a comprehensive  MRSA colonization surveillance program. It is not intended to diagnose MRSA infection nor to guide or monitor treatment for MRSA infections.   Culture, blood (routine x 2)     Status: None (Preliminary result)   Collection Time: 03/22/17  2:15 PM  Result Value Ref Range Status   Specimen Description BLOOD RIGHT HAND  Final   Special Requests   Final    BOTTLES DRAWN AEROBIC ONLY Blood Culture results may not be optimal due to an inadequate volume of blood received in culture bottles   Culture NO GROWTH 4 DAYS  Final   Report Status PENDING  Incomplete  Culture, blood (routine x 2)     Status: None (Preliminary result)   Collection Time: 03/22/17  2:20 PM  Result Value Ref Range Status   Specimen Description BLOOD RIGHT HAND  Final   Special Requests   Final    BOTTLES DRAWN AEROBIC ONLY Blood Culture adequate volume   Culture NO GROWTH 4 DAYS  Final   Report Status PENDING  Incomplete    Coagulation Studies: No results for input(s): LABPROT, INR in the last 72 hours.  Urinalysis: No results for input(s): COLORURINE, LABSPEC, PHURINE, GLUCOSEU, HGBUR,  BILIRUBINUR, KETONESUR, PROTEINUR, UROBILINOGEN, NITRITE, LEUKOCYTESUR in the last 72 hours.  Invalid input(s): APPERANCEUR    Imaging: No results found.   Medications:   . sodium chloride 50 mL/hr at 03/24/17 1446  . sodium chloride 50 mL/hr at 03/26/17 1822  . sodium chloride    . sodium chloride     . aspirin  300 mg Rectal Daily   Or  . aspirin  325 mg Oral Daily  . atorvastatin  80 mg Oral q1800  . carvedilol  3.125 mg Oral BID WC  . dronabinol  2.5 mg Oral QAC lunch  . feeding supplement (ENSURE ENLIVE)  237 mL Oral BID BM  . thiamine  100 mg Oral Daily   sodium chloride, sodium chloride, acetaminophen **OR** acetaminophen, alteplase, antiseptic oral rinse, heparin, lidocaine (PF), lidocaine-prilocaine, pentafluoroprop-tetrafluoroeth, polyvinyl alcohol  Assessment/ Plan:  1. ESRD MWF [facility] 2. New Small CerebellarischemicCVA 3. AMS with Dementia 4. Progressive decline/FTT   May need feeding tube  5. Persistent tachycardia 6. Traumatic SDH 01/2017 seen at [facility] 7. Chronic sCHF 8. CAD 9. HTN, stable 10. Anemia, stable 11. CKD BMD: P - 6.9  No binders  No oral intake  12. ? Seizures   continue dialysis and will need SELECT or [facility] placement  - nutrition is going to have to be addressed at some point  Patient stable and seen on dialysis    LOS: 8 Jadaya [place] W @TODAY @11 :01 AM

## 2017-03-27 NOTE — Progress Notes (Signed)
PROGRESS NOTE    ASCHER SCHROEPFER  NIO:[id number] DOB: 06-05-1945 DOA: 03/19/2017 PCP: Mackie Pai, PA-C      Brief Narrative:  [name] is a 72 yo M with ESRD on MWF HD, HTN, DM and recent traumatic subdural hematoma, not evacuated who presents with stroke.  Now has failure to thrive.  Hospice referral was recommended, family/POA have requested continued aggressive care and hospice plans were canceled. However on 1/9, after discussing with IR team, family more agreeable to meet with palliative care team. Family requested formal Neurology consultation on 1/10. They are still contemplating between LTAC versus palliative care.    Assessment & Plan:  Principal Problem:   Acute CVA (cerebrovascular accident) ([place]) Active Problems:   Anemia of renal disease   ESRD (end stage renal disease) ([place])   Acute encephalopathy   DM (diabetes mellitus), type 2 with renal complications ([name])   Advance care planning   Goals of care, counseling/discussion   Palliative care by specialist   Leukocytosis   Tobacco abuse   History of prostate cancer   Benign prostatic hyperplasia   Protein-calorie malnutrition, severe      Encephalopathy Delirium Failure to thrive See Dr. [name] note from 1/6.  This was not the patient's presenting complaint, but has become the central concern.  The patient has had a decline since mid year last year (even before his SDH, he was declining, had seen Neurology in June and then October, noted to have dementia, starting to need help with dressing, ADLs).    This has obviously accelerated since the SDH, to the point that in most of the notes from his Dec hostpialization here, and his office visit follow up, he was essentially not talking.  Wife relates that to her (in that interval), he would still say brief things, like "let's get something to eat", but this appears to be part of an overall pattern of decline that precedes the SDH --> but has clearly accelerated  after.  Appreciated Palliative Care recommendations, their input is appreciated.  His precise illness process is undefined, but he is clearly showing signs of failing to thrive.  With regard to work up during this hospitalization: his encephalopathy is not from vascular injury (his BP has been good, his MRA brain on day 2 of hospitalization showed no severe intracranial stenosis, echocardiogram normal) unless new stroke is a contributor (MRI brain 03/20/17 showed 1 cm acute ischemic non-hemorrhagic infarct central left cerebellar hemisphere, near interval resolution of previously seen right posterior fossa subdural collection, advanced parenchymal volume loss and multiple chronic microhemorrhages throughout the brain that showed however this stroke less likely to have caused his overall decline by itself); there is no evidence of infection, no localizing signs, pneumonia, fever, nor bacteremia as well as normal HIV and RPR during last admission for encephalopathy; there is no known cancer; this is not a medication induced encephalopathy: he has been off medications of all kinds and undergone dialysis during this hospitalization without change; EEG was refused by patient this time, but was negative during last hospitalziation for encephaloapthy; there is no new trauma (although old traumatic SDH is clearly contributing); this is not endocrine or metabolic as his TSH is normal, electrolytes are normal and been dialyzed, B12 and ammonia was normal during last hospitalization, and phosphate is only moderately low  -  Discussed in detail with Dr. [name], Neurology. His input much appreciated. Patient does have progressive failure to thrive but had abrupt decline over the past week and  he feels that there may be some chance of recovery to the level of function and of last year. He also recommended LTAC if patient/family wished to pursue aggressive care. MRI: Yet to be done. EEG: Encephalopathic and no seizure  activity. Supplementing thiamine.  Acute Stroke -Continue daily aspirin, statin, Coreg if he takes them. Speech therapy recommended dysphagia 1 diet and thin liquids. Encourage oral intake. Hopefully can avoid tube feeding. - As discussed with neurology, small stroke unlikely to have caused his abrupt decline in mental status. Repeating MRI brain.  Tachycardia Leukocytosis There are no localizing signs of infection.  ESRD -Consult to Nephrology for routine HD. Discussed with Dr. [name] who agrees that palliative care is appropriate for this patient. - Patient will need LTAC because he may not be able to sit for outpatient dialysis.  Diabetes This is diet controlled per notes. -SSI q4hrs, low threshold to discontinue  Anemia of renal disease Stable around baseline ~12.  Other medications -May continue venlafaxine and ranitidine and dronabinol if willing/able to take       DVT prophylaxis: SCDs Code Status: DO NOT RESUSCITATE Family Communication: Discussed in detail with patient's spouse at bedside. Updated care and answered questions. Disposition Plan: Pending further evaluation including MRI, neurology input, patient progress with diet. Disposition to be made early next week.   Consultants:   Neurology  Nephrology  Palliative Care  Procedures:   MRI brain  MRA head  Carotid US  Echocardiogram     Subjective: As per spouse, over the last day or 2, has been more responsive and talking more. Seen this morning at HD but again patient awake and alert but not saying anything. Does not follow instructions.  Objective: Vitals:   03/27/17 1130 03/27/17 1200 03/27/17 1203 03/27/17 1426  BP: (!) 109/51 (!) 109/46 (!) 95/45 96/62  Pulse: (!) 115 (!) 120 (!) 112 (!) 121  Resp:   16 16  Temp:    98.3 F (36.8 C)  TempSrc:    Axillary  SpO2:   96% 100%  Weight:      Height:        Intake/Output Summary (Last 24 hours) at 03/27/2017 1716 Last data filed at  03/27/2017 1203 Gross per 24 hour  Intake -  Output 1272 ml  Net -1272 ml   Filed Weights   03/25/17 1135 03/27/17 0304 03/27/17 0741  Weight: 50.4 kg (111 lb 1.8 oz) 56.8 kg (125 lb 3.5 oz) 52.1 kg (114 lb 13.8 oz)    Examination: General appearance: elderly male, small built, frail, chronically ill looking, lying comfortably in bed. Does not appear in any distress. Undergoing dialysis. Respiratory system: Poor inspiratory effort. Seems clear to auscultation. No increased work of breathing. Stable without change. Cardiovascular system: S1 and S2 heard, regular tachycardic. No JVD, murmurs or pedal edema. Stable without change. Abdominal exam: Nondistended, soft and nontender. Normal bowel sounds heard. Stable without change. CNS: Mental status as above. Nonverbal and does not follow instructions. Extremities: Not seen moving much.      Data Reviewed: I have personally reviewed following labs and imaging studies:  CBC: Recent Labs  Lab 03/22/17 1054 03/23/17 0448 03/25/17 0720 03/27/17 0745  WBC 13.9* 16.7* 13.9* 8.1  HGB 13.6 12.9* 11.1* 10.9*  HCT 40.9 39.9 33.9* 33.4*  MCV 93.8 94.3 94.4 94.4  PLT 224 221 211 662   Basic Metabolic Panel: Recent Labs  Lab 03/20/17 2300 03/22/17 1054 03/23/17 0448 03/25/17 0720 03/27/17 0745  NA  --  135 139 141 141  K  --  5.2* 5.1 4.3 4.3  CL  --  96* 96* 101 103  CO2  --  23 20* 23 20*  GLUCOSE  --  162* 139* 152* 118*  BUN  --  46* 59* 68* 71*  CREATININE  --  10.93* 12.79* 10.93* 10.17*  CALCIUM  --  9.5 9.1 8.7* 8.5*  PHOS 1.6*  --   --  6.9* 7.6*   GFR: Estimated Creatinine Clearance: 5 mL/min (A) (by C-G formula based on SCr of 10.17 mg/dL (H)). Liver Function Tests: Recent Labs  Lab 03/22/17 1054 03/25/17 0720 03/27/17 0745  AST 12*  --   --   ALT 8*  --   --   ALKPHOS 97  --   --   BILITOT 1.2  --   --   PROT 8.5*  --   --   ALBUMIN 3.8 2.9* 2.8*   No results for input(s): LIPASE, AMYLASE in the last 168  hours. No results for input(s): AMMONIA in the last 168 hours. Coagulation Profile: No results for input(s): INR, PROTIME in the last 168 hours. Cardiac Enzymes: No results for input(s): CKTOTAL, CKMB, CKMBINDEX, TROPONINI in the last 168 hours. BNP (last 3 results) Recent Labs    07/09/16 1438 07/31/16 1104 08/19/16 1155  PROBNP 1,044.0* 343.0* 349.0*   HbA1C: No results for input(s): HGBA1C in the last 72 hours. CBG: Recent Labs  Lab 03/26/17 1149 03/26/17 1647 03/26/17 2121 03/27/17 0638 03/27/17 1314  GLUCAP 106* 119* 125* 104* 88    Recent Results (from the past 240 hour(s))  MRSA PCR Screening     Status: None   Collection Time: 03/20/17 11:59 PM  Result Value Ref Range Status   MRSA by PCR NEGATIVE NEGATIVE Final    Comment:        The GeneXpert MRSA Assay (FDA approved for NASAL specimens only), is one component of a comprehensive MRSA colonization surveillance program. It is not intended to diagnose MRSA infection nor to guide or monitor treatment for MRSA infections.   Culture, blood (routine x 2)     Status: None   Collection Time: 03/22/17  2:15 PM  Result Value Ref Range Status   Specimen Description BLOOD RIGHT HAND  Final   Special Requests   Final    BOTTLES DRAWN AEROBIC ONLY Blood Culture results may not be optimal due to an inadequate volume of blood received in culture bottles   Culture NO GROWTH 5 DAYS  Final   Report Status 03/27/2017 FINAL  Final  Culture, blood (routine x 2)     Status: None   Collection Time: 03/22/17  2:20 PM  Result Value Ref Range Status   Specimen Description BLOOD RIGHT HAND  Final   Special Requests   Final    BOTTLES DRAWN AEROBIC ONLY Blood Culture adequate volume   Culture NO GROWTH 5 DAYS  Final   Report Status 03/27/2017 FINAL  Final         Radiology Studies: No results found.      Scheduled Meds: . aspirin  300 mg Rectal Daily   Or  . aspirin  325 mg Oral Daily  . atorvastatin  80 mg  Oral q1800  . carvedilol  3.125 mg Oral BID WC  . dronabinol  2.5 mg Oral QAC lunch  . feeding supplement (ENSURE ENLIVE)  237 mL Oral BID BM  . thiamine  100 mg Oral Daily   Continuous Infusions: .  sodium chloride 50 mL/hr at 03/24/17 1446  . sodium chloride 50 mL/hr at 03/26/17 1822     LOS: 8 days    Time spent: 25 minutes    Vernell Leep, MD, [facility], [facility]. [facility] Pager [phone]  If 7PM-7AM, please contact night-coverage [email or web address] Password [facility] 03/27/2017, 5:16 PM

## 2017-03-27 NOTE — Progress Notes (Signed)
Pt back from dialysis at 1300. CBG 88. Pt stable, Alert to self,  following some commands. Will continue to monitor.

## 2017-03-27 NOTE — Progress Notes (Signed)
Subjective: No significant changes  Exam: Vitals:   03/27/17 1426 03/27/17 1735  BP: 96/62 122/75  Pulse: (!) 121 (!) 120  Resp: 16   Temp: 98.3 F (36.8 C) 98.2 F (36.8 C)  SpO2: 100% 94%   Gen: In bed, NAD Resp: non-labored breathing, no acute distress Abd: soft, nt  Neuro: MS: Awake, states he is going to "knock me out" does not answer questions or follow commands. CN: Fixates and tracks across midline Motor: I do question if he is using his left arm more than his right, but when he makes violent gestures, he does appear to use his right arm fairly well. Sensory: He responds to stimulation bilaterally  EEG reviewed, mildly encephalopathic  Impression:  72 year old male with acute altered mental status in the setting of vascular dementia.  I agree that the cerebellar infarct is not adequate to explain his overall confusion.  He has had progressive failure to thrive, but the abrupt change over the past week does make me think there may be some chance of recovery to the level of function that he was experiencing in between his December and January hospitalizations.  Also, he has had significant weight loss, one consideration could be thiamine deficiency as a contributor.  If he had had an embolic shower, this could explain his change in mental status.  I would also like to check an EEG to assess for seizures.  He has already had B12, TSH, ammonia checked which were all normal.  Recommendations: 1) MRI brain  [name], MD [facility] [phone]  If 7pm- 7am, please page neurology on call as listed in [place].

## 2017-03-27 NOTE — Procedures (Signed)
History: 72 year old male with acute on chronic encephalopathy  Sedation: None  Technique: This is a 21 channel routine scalp EEG performed at the bedside with bipolar and monopolar montages arranged in accordance to the international 10/20 system of electrode placement. One channel was dedicated to EKG recording.    Background: There is a posterior dominant rhythm of 7-8 Hz.  In addition there is intrusion of generalized irregular delta and theta activity.  There is also frontally predominant intermittent rhythmic delta activity (FIRDA).   Photic stimulation: Physiologic driving is not performed  EEG Abnormalities: 1) FIRDA 2) generalized irregular slow activity  Clinical Interpretation: This EEG is consistent with a mild generalized non-specific cerebral dysfunction(encephalopathy). There was no seizure or seizure predisposition recorded on this study. Please note that a normal EEG does not preclude the possibility of epilepsy.   [name], MD [facility] [phone]  If 7pm- 7am, please page neurology on call as listed in [place].

## 2017-03-27 NOTE — Evaluation (Signed)
Clinical/Bedside Swallow Evaluation Patient Details  Name: [name] MRN: [id number] Date of Birth: [birth year]  Today's Date: 03/27/2017 Time: SLP Start Time (ACUTE ONLY): 3329(5188) SLP Stop Time (ACUTE ONLY): 1420 SLP Time Calculation (min) (ACUTE ONLY): 30 min  Past Medical History:  Past Medical History:  Diagnosis Date  . 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 Anemia, iron deficiency 03/24/2011   a. Recurrent GI bleed, tx with periodic iron infusions.  . 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 AVM (arteriovenous malformation) of duodenum, acquired    egds in 01/2012, 04/2011  . BPH (benign prostatic hyperplasia)   . CAD (coronary artery disease)    a. Cath 09/2012: moderate borderline CAD in mid LAD/small diagonal branch, mild RCA stenosis, to be managed medically   . Chronic lower back pain   . Diabetic peripheral neuropathy ([place]) 2014   foot pain.  . Essential hypertension 02/01/2012  . Fatty liver    on CT of 11/2010  . GERD (gastroesophageal reflux disease)   . GI bleed    a. Recurrent GI bleed, tx with IV iron. b. Per heme notes - likely AVMs 04/2012 (tx with cauterization several months ago).  . Hematuria    a. Urology note scan from 07/2012: cystoscopy without evidence for bladder lesion, only lateral hypertrophy of posterior urethra, bladder impression from BPH. b. Pt states he had "some tests" scheduled for later in July 2014.  . High cholesterol   . Hyperlipidemia 02/01/2012  . Hypertension   . Hypertensive urgency 07/06/2016  . 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 Orthostatic hypotension    a. Tx with florinef.  . Peripheral neuropathy   . Polyp, colonic    Colonoscopy 01/2012 "benign" polyp  . Prostate cancer ([place])   . Sleep apnea    "had mask; couldn't sleep in it" (09/20/2012)  . Smoker   . SOB (shortness of breath) 07/06/2016  . Stage III chronic kidney disease ([name])    a. Stage 3 (DM with complications ->CKD, peripheral neuropathy).   . Stroke ([place])    x 2  . Symptomatic anemia 04/03/2015  . Tinea pedis 05/24/2012  . Tobacco use 02/01/2012  . Type 2 diabetes, uncontrolled, with neuropathy ([place]) 02/01/2012  . Type II diabetes mellitus ([place])    a. Dx 1994, uncontrolled.    Past Surgical History:  Past Surgical History:  Procedure Laterality Date  . ENTEROSCOPY N/A 03/01/2013   Procedure: ENTEROSCOPY;  Surgeon: Inda Castle, MD;  Location: [place];  Service: Endoscopy;  Laterality: N/A;  . ENTEROSCOPY N/A 06/26/2016   Procedure: ENTEROSCOPY;  Surgeon: Milus Banister, MD;  Location: [place];  Service: Endoscopy;  Laterality: N/A;  this is a push enteroscopy  . ENTEROSCOPY N/A 10/23/2016   Procedure: ENTEROSCOPY;  Surgeon: Gatha Mayer, MD;  Location: [facility] ENDOSCOPY;  Service: Endoscopy;  Laterality: N/A;  . GIVENS CAPSULE STUDY N/A 04/05/2015   Procedure: GIVENS CAPSULE STUDY;  Surgeon: Gatha Mayer, MD;  Location: [place];  Service: Endoscopy;  Laterality: N/A;  . INGUINAL HERNIA REPAIR Right 2011  . LEFT HEART CATH AND CORONARY ANGIOGRAPHY N/A 12/23/2016   Procedure: LEFT HEART CATH AND CORONARY ANGIOGRAPHY;  Surgeon: Leonie Man, MD;  Location: [place] CV LAB;  Service: Cardiovascular;  Laterality: N/A;  . LEFT HEART CATHETERIZATION WITH CORONARY ANGIOGRAM N/A 09/21/2012   Procedure: LEFT HEART CATHETERIZATION WITH CORONARY ANGIOGRAM;  Surgeon: Peter M Martinique, MD;  Location: [facility] CATH LAB;  Service: Cardiovascular;  Laterality: N/A;  .  LUMBAR DISC SURGERY  1980's  . LYMPHADENECTOMY Bilateral 01/19/2013   Procedure: LYMPHADENECTOMY;  Surgeon: Bernestine Amass, MD;  Location: [facility];  Service: Urology;  Laterality: Bilateral;  . ROBOT ASSISTED LAPAROSCOPIC RADICAL PROSTATECTOMY N/A 01/19/2013   Procedure: ROBOTIC ASSISTED LAPAROSCOPIC RADICAL PROSTATECTOMY;  Surgeon: Bernestine Amass, MD;  Location: [facility];  Service: Urology;  Laterality: N/A;  . SHOULDER OPEN ROTATOR CUFF REPAIR Left 1980's   HPI:   72 y.o.malewith past medical history of ESRD on dialysis, dementia, HTN,recentfall with facial fractures in November, recent hospitalization for subdural hematoma, recent admission for encephalopathy, nowadmitted on1/3/2019with altered mental status and incidental finding on CT of cerebellar infarct.Now has failure to thrive. Hospice referral was recommended, family/POA have requested continued aggressive careand hospice plans were canceled. Placed on a regular diet by Palliative care team 1/7 with goals shifting towards comfort however now seems that family wishes to continue HD.  Note that the patient has had a decline since mid year last year (even before his SDH, he was declining, had seen Neurology in June and then October, noted to have dementia, starting to need help with dressing, ADLs). Pt was seen by SLP initially during this admit, but pt was not progressing and Palliative care initiated regualr diet and thin liquids for comfort and discharged SLP orders. Pts wife has since verbalized that she would not want tp to eat/drink with known risk and that he is not capable of that diet. SLP was reordered.    Assessment / Plan / Recommendation Clinical Impression  Pt demonstrates ongoing oral dysphagia, particularly impacted by cognition. Pt has improved from prior visits as he is now following one step commands and accepting oral intake. Immediate signs of aspiration observed with thin liquids and straw sips of nectar thick liquids. Suspect a delay in swallow initiation. Oral response and bolus formation is increasingly slow as session progresses, pt appears tired after dialysis. Oral holding noted, instucted wife on using verbal cues, liquid wash and dry teaspoon to elicit a swallow if needed and how to identify if pt has swallowed. We discussed signs of aspiration and probability of slow, limited intake. Will continue to follow for tolerance and readiness for diet advancement if possible.   SLP Visit Diagnosis: Dysphagia, oropharyngeal phase (R13.12)    Aspiration Risk  Moderate aspiration risk    Diet Recommendation Dysphagia 1 (Puree);Nectar-thick liquid   Liquid Administration via: Spoon Medication Administration: Crushed with puree Supervision: Full supervision/cueing for compensatory strategies Compensations: Slow rate;Small sips/bites;Follow solids with liquid;Multiple dry swallows after each bite/sip Postural Changes: Remain upright for at least 30 minutes after po intake;Seated upright at 90 degrees    Other  Recommendations Oral Care Recommendations: Oral care BID Other Recommendations: Have oral suction available   Follow up Recommendations Skilled Nursing facility      Frequency and Duration min 2x/week  2 weeks       Prognosis Prognosis for Safe Diet Advancement: Fair Barriers to Reach Goals: Severity of deficits      Swallow Study   General HPI: 72 y.o.malewith past medical history of ESRD on dialysis, dementia, HTN,recentfall with facial fractures in November, recent hospitalization for subdural hematoma, recent admission for encephalopathy, nowadmitted on1/3/2019with altered mental status and incidental finding on CT of cerebellar infarct.Now has failure to thrive. Hospice referral was recommended, family/POA have requested continued aggressive careand hospice plans were canceled. Placed on a regular diet by Palliative care team 1/7 with goals shifting towards comfort however now seems that family wishes  to continue HD.  Note that the patient has had a decline since mid year last year (even before his SDH, he was declining, had seen Neurology in June and then October, noted to have dementia, starting to need help with dressing, ADLs). Pt was seen by SLP initially during this admit, but pt was not progressing and Palliative care initiated regualr diet and thin liquids for comfort and discharged SLP orders. Pts wife has since verbalized that she  would not want tp to eat/drink with known risk and that he is not capable of that diet. SLP was reordered.  Type of Study: Bedside Swallow Evaluation Previous Swallow Assessment: see HPI Diet Prior to this Study: Regular;Thin liquids Temperature Spikes Noted: No Respiratory Status: Room air History of Recent Intubation: No Behavior/Cognition: Alert;Cooperative;Confused;Requires cueing Oral Care Completed by SLP: No Oral Cavity - Dentition: Poor condition;Missing dentition Vision: Impaired for self-feeding Self-Feeding Abilities: Total assist Patient Positioning: Upright in bed Baseline Vocal Quality: Hoarse;Low vocal intensity Volitional Cough: Strong Volitional Swallow: Able to elicit    Oral/Motor/Sensory Function Overall Oral Motor/Sensory Function: Generalized oral weakness Facial Symmetry: Abnormal symmetry right   Ice Chips Ice chips: Within functional limits Presentation: Spoon   Thin Liquid Thin Liquid: Impaired Presentation: Straw;Cup Pharyngeal  Phase Impairments: Cough - Immediate;Cough - Delayed    Nectar Thick Nectar Thick Liquid: Impaired Presentation: Straw Oral phase functional implications: Oral holding;Prolonged oral transit Pharyngeal Phase Impairments: Cough - Immediate   Honey Thick Honey Thick Liquid: Not tested   Puree Puree: Impaired Presentation: Spoon Oral Phase Functional Implications: Oral holding;Prolonged oral transit   Solid   GO   Solid: Not tested       Troy Baltimore, MA CCC-SLP [phone]  Karter Haire, Katherene Ponto 03/27/2017,2:35 PM

## 2017-03-27 NOTE — Progress Notes (Signed)
Patient ID: [name], male   DOB: [birth year], 72 y.o.   MRN: [id number]   Percutaneous gastric tube request still pending.  RN states family is still undecided  Will let MD revisit this with family today IR PA will check chart later today We can plan on possibly Mon to move ahead if need.  If no needs---please cancel request

## 2017-03-27 NOTE — Progress Notes (Signed)
SLP Cancellation Note  Patient Details Name: [name] MRN: [id number] DOB: Jan 17, 1946   Cancelled treatment:       Reason Eval/Treat Not Completed: Patient at procedure or test/unavailable. Pt already in HD when I arrived this am. Will f/u in pm.    Noor Vidales, Katherene Ponto 03/27/2017, 9:32 AM

## 2017-03-28 ENCOUNTER — Inpatient Hospital Stay (HOSPITAL_COMMUNITY): Payer: Medicare Other

## 2017-03-28 LAB — GLUCOSE, CAPILLARY
GLUCOSE-CAPILLARY: 267 mg/dL — AB (ref 65–99)
GLUCOSE-CAPILLARY: 155 mg/dL — AB (ref 65–99)
Glucose-Capillary: 148 mg/dL — ABNORMAL HIGH (ref 65–99)
Glucose-Capillary: 155 mg/dL — ABNORMAL HIGH (ref 65–99)

## 2017-03-28 MED ORDER — WHITE PETROLATUM EX OINT
TOPICAL_OINTMENT | CUTANEOUS | Status: AC
  Filled 2017-03-28: qty 28.35

## 2017-03-28 NOTE — Progress Notes (Signed)
PROGRESS NOTE    [name]  ZOX:[id number] DOB: [birth year] DOA: 03/19/2017 PCP: Mackie Pai, PA-C      Brief Narrative:  [name] is a 72 yo M with ESRD on MWF HD, HTN, DM and recent traumatic subdural hematoma, not evacuated who presents with stroke.  Now has failure to thrive.  Hospice referral was recommended, family/POA have requested continued aggressive care and hospice plans were canceled. However on 1/9, after discussing with IR team, family more agreeable to meet with palliative care team. Family requested formal Neurology consultation on 1/10.    Assessment & Plan:  Principal Problem:   Acute CVA (cerebrovascular accident) ([name]) Active Problems:   Anemia of renal disease   ESRD (end stage renal disease) ([place])   Acute encephalopathy   DM (diabetes mellitus), type 2 with renal complications ([name])   Advance care planning   Goals of care, counseling/discussion   Palliative care by specialist   Leukocytosis   Tobacco abuse   History of prostate cancer   Benign prostatic hyperplasia   Protein-calorie malnutrition, severe      Encephalopathy Delirium Failure to thrive See Dr. [name] note from 1/6.  This was not the patient's presenting complaint, but has become the central concern.  The patient has had a decline since mid year last year (even before his SDH, he was declining, had seen Neurology in June and then October, noted to have dementia, starting to need help with dressing, ADLs).    This has obviously accelerated since the SDH, to the point that in most of the notes from his Dec hostpialization here, and his office visit follow up, he was essentially not talking.  Wife relates that to her (in that interval), he would still say brief things, like "let's get something to eat", but this appears to be part of an overall pattern of decline that precedes the SDH --> but has clearly accelerated after.  Appreciated Palliative Care recommendations, their input is  appreciated.  His precise illness process is undefined, but he is clearly showing signs of failing to thrive.  With regard to work up during this hospitalization: his encephalopathy is not from vascular injury (his BP has been good, his MRA brain on day 2 of hospitalization showed no severe intracranial stenosis, echocardiogram normal) unless new stroke is a contributor (MRI brain 03/20/17 showed 1 cm acute ischemic non-hemorrhagic infarct central left cerebellar hemisphere, near interval resolution of previously seen right posterior fossa subdural collection, advanced parenchymal volume loss and multiple chronic microhemorrhages throughout the brain that showed however this stroke less likely to have caused his overall decline by itself); there is no evidence of infection, no localizing signs, pneumonia, fever, nor bacteremia as well as normal HIV and RPR during last admission for encephalopathy; there is no known cancer; this is not a medication induced encephalopathy: he has been off medications of all kinds and undergone dialysis during this hospitalization without change; EEG was refused by patient this time, but was negative during last hospitalziation for encephaloapthy; there is no new trauma (although old traumatic SDH is clearly contributing); this is not endocrine or metabolic as his TSH is normal, electrolytes are normal and been dialyzed, B12 and ammonia was normal during last hospitalization, and phosphate is only moderately low  -  Discussed in detail with Dr. [name], Neurology. His input much appreciated. Patient does have progressive failure to thrive but had abrupt decline over the past week pta and he feels that there may be some chance  of recovery to the level of function end of last year. He also recommended LTAC if patient/family wished to pursue aggressive care. MRI: Stable subacute 10 mm infarct left cerebellum, no other acute infarct, multiple remote lacunar infarcts of the basal  ganglia and corona radiata, multiple foci of chronic microhemorrhages/stable and advanced atrophy and white matter disease . EEG: Encephalopathic and no seizure activity. Supplementing thiamine. - Even though patient is more alert than a couple days ago, do not see objective improvement in mental status. Shows some intermittent aggressive behavior.  Acute Stroke -Continue daily aspirin, statin, Coreg if he takes them. Speech therapy recommended dysphagia 1 diet and nectar thickened liquids. Encourage oral intake. Hopefully can avoid tube feeding. - As discussed with neurology, small stroke unlikely to have caused his abrupt decline in mental status. Repeating MRI brain.  Tachycardia Leukocytosis There are no localizing signs of infection.  ESRD -Consult to Nephrology for routine HD. Discussed with Dr. [name] who agrees that palliative care is appropriate for this patient. - Patient will need LTAC because he may not be able to sit for outpatient dialysis.  Diabetes This is diet controlled per notes. -SSI q4hrs, low threshold to discontinue  Anemia of renal disease Stable around baseline ~12.  Other medications -May continue venlafaxine and ranitidine and dronabinol if willing/able to take       DVT prophylaxis: SCDs Code Status: DO NOT RESUSCITATE Family Communication: None at bedside. Disposition Plan: Pending patient progress with diet and mental status. Disposition to be made early next week. May need LTAC for HD needs.   Consultants:   Neurology  Nephrology  Palliative Care  Procedures:   MRI brain  MRA head  Carotid US  Echocardiogram     Subjective: Patient alone in the room. No family at bedside. Alert and tracks activity. Does not say anything but on repeated questioning responds "what the hell do you want". As per discussion with Nephrology, was trying to bite when examining.  Objective: Vitals:   03/28/17 0146 03/28/17 0502 03/28/17 1009 03/28/17  1402  BP: 116/71 103/61 109/62 116/60  Pulse: (!) 114 (!) 116 (!) 104 (!) 101  Resp: 20 20 18 19   Temp: 98.3 F (36.8 C) 98.5 F (36.9 C) (!) 97.5 F (36.4 C) 98.4 F (36.9 C)  TempSrc: Oral Oral Axillary Oral  SpO2: 95% 94% 100% 96%  Weight:      Height:        Intake/Output Summary (Last 24 hours) at 03/28/2017 1611 Last data filed at 03/28/2017 0850 Gross per 24 hour  Intake 1923.33 ml  Output -  Net 1923.33 ml   Filed Weights   03/27/17 0304 03/27/17 0741 03/27/17 1203  Weight: 56.8 kg (125 lb 3.5 oz) 52.1 kg (114 lb 13.8 oz) 51 kg (112 lb 7 oz)    Examination: General appearance: elderly male, small built, frail, chronically ill looking, lying comfortably in bed. Does not appear in any distress. Appears comfortable and in no distress. Not cooperative with exam. CNS: Mental status as above. Does not follow instructions. Extremities: Not seen moving much.      Data Reviewed: I have personally reviewed following labs and imaging studies:  CBC: Recent Labs  Lab 03/22/17 1054 03/23/17 0448 03/25/17 0720 03/27/17 0745  WBC 13.9* 16.7* 13.9* 8.1  HGB 13.6 12.9* 11.1* 10.9*  HCT 40.9 39.9 33.9* 33.4*  MCV 93.8 94.3 94.4 94.4  PLT 224 221 211 790   Basic Metabolic Panel: Recent Labs  Lab 03/22/17 1054  03/23/17 0448 03/25/17 0720 03/27/17 0745  NA 135 139 141 141  K 5.2* 5.1 4.3 4.3  CL 96* 96* 101 103  CO2 23 20* 23 20*  GLUCOSE 162* 139* 152* 118*  BUN 46* 59* 68* 71*  CREATININE 10.93* 12.79* 10.93* 10.17*  CALCIUM 9.5 9.1 8.7* 8.5*  PHOS  --   --  6.9* 7.6*   GFR: Estimated Creatinine Clearance: 4.9 mL/min (A) (by C-G formula based on SCr of 10.17 mg/dL (H)). Liver Function Tests: Recent Labs  Lab 03/22/17 1054 03/25/17 0720 03/27/17 0745  AST 12*  --   --   ALT 8*  --   --   ALKPHOS 97  --   --   BILITOT 1.2  --   --   PROT 8.5*  --   --   ALBUMIN 3.8 2.9* 2.8*   No results for input(s): LIPASE, AMYLASE in the last 168 hours. No results  for input(s): AMMONIA in the last 168 hours. Coagulation Profile: No results for input(s): INR, PROTIME in the last 168 hours. Cardiac Enzymes: No results for input(s): CKTOTAL, CKMB, CKMBINDEX, TROPONINI in the last 168 hours. BNP (last 3 results) Recent Labs    07/09/16 1438 07/31/16 1104 08/19/16 1155  PROBNP 1,044.0* 343.0* 349.0*   HbA1C: No results for input(s): HGBA1C in the last 72 hours. CBG: Recent Labs  Lab 03/27/17 1314 03/27/17 1727 03/27/17 2117 03/28/17 0620 03/28/17 1101  GLUCAP 88 155* 133* 148* 155*    Recent Results (from the past 240 hour(s))  MRSA PCR Screening     Status: None   Collection Time: 03/20/17 11:59 PM  Result Value Ref Range Status   MRSA by PCR NEGATIVE NEGATIVE Final    Comment:        The GeneXpert MRSA Assay (FDA approved for NASAL specimens only), is one component of a comprehensive MRSA colonization surveillance program. It is not intended to diagnose MRSA infection nor to guide or monitor treatment for MRSA infections.   Culture, blood (routine x 2)     Status: None   Collection Time: 03/22/17  2:15 PM  Result Value Ref Range Status   Specimen Description BLOOD RIGHT HAND  Final   Special Requests   Final    BOTTLES DRAWN AEROBIC ONLY Blood Culture results may not be optimal due to an inadequate volume of blood received in culture bottles   Culture NO GROWTH 5 DAYS  Final   Report Status 03/27/2017 FINAL  Final  Culture, blood (routine x 2)     Status: None   Collection Time: 03/22/17  2:20 PM  Result Value Ref Range Status   Specimen Description BLOOD RIGHT HAND  Final   Special Requests   Final    BOTTLES DRAWN AEROBIC ONLY Blood Culture adequate volume   Culture NO GROWTH 5 DAYS  Final   Report Status 03/27/2017 FINAL  Final         Radiology Studies: [name] Contrast  Result Date: 03/28/2017 CLINICAL DATA:  Focal neuro deficits greater than 6 hours. Recent stroke. Question new stroke. EXAM: MRI HEAD  WITHOUT CONTRAST TECHNIQUE: Multiplanar, multiecho pulse sequences of the brain and surrounding structures were obtained without intravenous contrast. COMPARISON:  MRI of the brain 03/20/2017.  CT head 03/19/2017. FINDINGS: Brain: Diffusion-weighted images again demonstrate the subacute nonhemorrhagic 1 cm left cerebellar infarct. Scattered foci of chronic microhemorrhage are again seen. The largest areas in the left lentiform nucleus. These are better visualized on today's study  with less motion. Multiple remote lacunar infarcts of the basal ganglia are stable. Remote thalamic infarcts are present. White matter changes extend into the brainstem. Remote infarcts are again seen within the corona radiata bilaterally. Ventricles are proportionate to the degree of atrophy. No significant extra-axial fluid collection is present. Vascular: Flow is present in the major intracranial arteries. Skull and upper cervical spine: The skull base is within normal limits. The craniocervical junction is normal. Slight anterolisthesis is present at C3-4. Marrow signal is diffusely depressed. Is likely related to anemia and end-stage renal disease. Sinuses/Orbits: The paranasal sinuses are clear. There is some fluid in the left mastoid air cells. Globes and orbits are within normal limits. IMPRESSION: 1. Stable subacute 10 mm infarct involving the left cerebellum. 2. No other acute infarct. 3. Multiple remote lacunar infarcts of the basal ganglia and corona radiata are stable. 4. Multiple foci of chronic microhemorrhage are stable. 5. Advanced atrophy and white matter disease. Electronically Signed   By: San Morelle M.D.   On: 03/28/2017 10:21        Scheduled Meds: . aspirin  300 mg Rectal Daily   Or  . aspirin  325 mg Oral Daily  . atorvastatin  80 mg Oral q1800  . carvedilol  3.125 mg Oral BID WC  . dronabinol  2.5 mg Oral QAC lunch  . feeding supplement (ENSURE ENLIVE)  237 mL Oral BID BM  . thiamine  100 mg  Oral Daily   Continuous Infusions: . sodium chloride 50 mL/hr at 03/24/17 1446  . sodium chloride 50 mL/hr at 03/28/17 1233     LOS: 9 days    Time spent: 25 minutes    Vernell Leep, MD, [place], [facility]. [facility] Pager [phone]  If 7PM-7AM, please contact night-coverage [email or web address] Password [email or web address] 03/28/2017, 4:11 PM

## 2017-03-28 NOTE — Progress Notes (Signed)
Subjective: Possibly mild improvements, on liquid diet  Exam: Vitals:   03/28/17 1402 03/28/17 1710  BP: 116/60 114/68  Pulse: (!) 101 (!) 107  Resp: 19 17  Temp: 98.4 F (36.9 C) 98.3 F (36.8 C)  SpO2: 96% 98%   Gen: In bed, NAD Resp: non-labored breathing, no acute distress Abd: soft, nt  Neuro: MS: Awake, states he is going to "knock me out."  He is slightly more interactive other than this. CN: Fixates and tracks across midline Motor: I do question if he is using his left arm more than his right, but when he makes violent gestures, he does appear to use his right arm fairly well. Sensory: He responds to stimulation bilaterally  MRI reviewed-unchanged  Impression:  72 year old male with acute altered mental status in the setting of vascular dementia.  I agree that the cerebellar infarct is not adequate to explain his overall confusion.  He has had progressive failure to thrive, but the abrupt change over the past week does make me think there may be some chance of recovery to the level of function that he was experiencing in between his December and January hospitalizations.  Also, he has had significant weight loss, one consideration could be thiamine deficiency as a contributor.  I suspect that this is primarily multifactorial delirium in the setting of failure to thrive and underlying dementia.  If he is able to take p.o., but would be beneficial, but if not and family wishes to pursue G-tube, it is possible that he could have slow continued improvement towards baseline.  This is not definite, however, and I discussed that with the brother today that he may not improve.  Recommendations: 1) agree with continued discussions with palliative care 2) if family does wish to pursue aggressive care, it is possible that he may gradually improve, though this is not definite.  [name], MD [facility] [phone]  If 7pm- 7am, please page neurology on call  as listed in [place].

## 2017-03-28 NOTE — Progress Notes (Signed)
  Speech Language Pathology Treatment: Dysphagia  Patient Details Name: [name] MRN: [id number] DOB: [birth year] Today's Date: 03/28/2017 Time: 1110-1140 SLP Time Calculation (min) (ACUTE ONLY): 30 min  Assessment / Plan / Recommendation Clinical Impression  Pt seen for follow-up for dysphagia. Brother present and reports pt consumed applesauce, magic cup. SLP educated re: nature of pt's dysphagia and current diet recommendations for dys 1, nectar thick liquids. SLP demonstrated appropriate technique for thickening liquids to nectar consistency. Demonstrated monitoring not just oral cavity, but hyolaryngeal movement to recognize when pt has swallowed. Cognitively-based dysphagia persists. Pt continues to hold liquids orally and suspect occasionally in the pharynx, requiring moderate verbal and tactile cues to initiate swallow. Neurologist arrived; noted increased need for cuing for swallow initiation in the presence of external distractions, conversations in the room. With medications crushed in puree, pt required multiple dry spoon cues and liquid wash via teaspoon to clear oral cavity. RN requesting to assess pt ability to take appetite stimulant whole in puree as this medication cannot be crushed. Pt did not transfer tablet but held in anterior sulcus even with max cues, ultimately SLP removed. Whole medications not recommmended; continue to crush. Continue dys 1, nectar thick liquids. Anticipate feeding to be a time-consuming and laborious process. Consider feeding in smaller, more frequent meals throughout the day. Will continue to follow for tolerance, pt/family education.      HPI HPI: 72 y.o.malewith past medical history of ESRD on dialysis, dementia, HTN,recentfall with facial fractures in November, recent hospitalization for subdural hematoma, recent admission for encephalopathy, nowadmitted on1/3/2019with altered mental status and incidental finding on CT of cerebellar  infarct.Now has failure to thrive. Hospice referral was recommended, family/POA have requested continued aggressive careand hospice plans were canceled. Placed on a regular diet by Palliative care team 1/7 with goals shifting towards comfort however now seems that family wishes to continue HD.  Note that the patient has had a decline since mid year last year (even before his SDH, he was declining, had seen Neurology in June and then October, noted to have dementia, starting to need help with dressing, ADLs). Pt was seen by SLP initially during this admit, but pt was not progressing and Palliative care initiated regualr diet and thin liquids for comfort and discharged SLP orders. Pts wife has since verbalized that she would not want tp to eat/drink with known risk and that he is not capable of that diet. SLP was reordered.       SLP Plan  Continue with current plan of care       Recommendations  Diet recommendations: Nectar-thick liquid;Dysphagia 1 (puree) Liquids provided via: Teaspoon Medication Administration: Crushed with puree Supervision: Full supervision/cueing for compensatory strategies;Trained caregiver to feed patient Compensations: Slow rate;Small sips/bites;Follow solids with liquid;Multiple dry swallows after each bite/sip                Oral Care Recommendations: Oral care BID Follow up Recommendations: Skilled Nursing facility SLP Visit Diagnosis: Dysphagia, oropharyngeal phase (R13.12) Plan: Continue with current plan of care       [place], [place], [place] [facility] [place] 03/28/2017, 11:41 AM

## 2017-03-28 NOTE — Procedures (Signed)
[facility] ROUNDING NOTE   Subjective:   Interval History:Acute CVA MWF dialysis [facility]   Stopped antibiotics   Seen on dialysis and family want to continue the current treatment plan. They do not want to stop dialysis  Patient appears to be doing much better although significant behavioral change     Objective:  Vital signs in last 24 hours:  Temp:  [97.5 F (36.4 C)-98.6 F (37 C)] 97.5 F (36.4 C) (01/12 1009) Pulse Rate:  [104-121] 104 (01/12 1009) Resp:  [16-20] 18 (01/12 1009) BP: (95-122)/(45-75) 109/62 (01/12 1009) SpO2:  [94 %-100 %] 100 % (01/12 1009) Weight:  [112 lb 7 oz (51 kg)] 112 lb 7 oz (51 kg) (01/11 1203)  Weight change: -5.8 oz (-4.7 kg) Filed Weights   03/27/17 0304 03/27/17 0741 03/27/17 1203  Weight: 125 lb 3.5 oz (56.8 kg) 114 lb 13.8 oz (52.1 kg) 112 lb 7 oz (51 kg)    Intake/Output: I/O last 3 completed shifts: In: -  Out: 1272 [Other:1272]   Intake/Output this shift:  No intake/output data recorded.  CVS- RRR RS- CTA ABD- BS present soft non-distended EXT- no edema  AVF   Basic Metabolic Panel: Recent Labs  Lab 03/22/17 1054 03/23/17 0448 03/25/17 0720 03/27/17 0745  NA 135 139 141 141  K 5.2* 5.1 4.3 4.3  CL 96* 96* 101 103  CO2 23 20* 23 20*  GLUCOSE 162* 139* 152* 118*  BUN 46* 59* 68* 71*  CREATININE 10.93* 12.79* 10.93* 10.17*  CALCIUM 9.5 9.1 8.7* 8.5*  PHOS  --   --  6.9* 7.6*    Liver Function Tests: Recent Labs  Lab 03/22/17 1054 03/25/17 0720 03/27/17 0745  AST 12*  --   --   ALT 8*  --   --   ALKPHOS 97  --   --   BILITOT 1.2  --   --   PROT 8.5*  --   --   ALBUMIN 3.8 2.9* 2.8*   No results for input(s): LIPASE, AMYLASE in the last 168 hours. No results for input(s): AMMONIA in the last 168 hours.  CBC: Recent Labs  Lab 03/22/17 1054 03/23/17 0448 03/25/17 0720 03/27/17 0745  WBC 13.9* 16.7* 13.9* 8.1  HGB 13.6 12.9* 11.1* 10.9*  HCT 40.9 39.9 33.9* 33.4*  MCV 93.8 94.3  94.4 94.4  PLT 224 221 211 167    Cardiac Enzymes: No results for input(s): CKTOTAL, CKMB, CKMBINDEX, TROPONINI in the last 168 hours.  BNP: Invalid input(s): POCBNP  CBG: Recent Labs  Lab 03/27/17 0638 03/27/17 1314 03/27/17 1727 03/27/17 2117 03/28/17 0620  GLUCAP 104* 88 155* 133* 148*    Microbiology: Results for orders placed or performed during the hospital encounter of 03/19/17  MRSA PCR Screening     Status: None   Collection Time: 03/20/17 11:59 PM  Result Value Ref Range Status   MRSA by PCR NEGATIVE NEGATIVE Final    Comment:        The GeneXpert MRSA Assay (FDA approved for NASAL specimens only), is one component of a comprehensive MRSA colonization surveillance program. It is not intended to diagnose MRSA infection nor to guide or monitor treatment for MRSA infections.   Culture, blood (routine x 2)     Status: None   Collection Time: 03/22/17  2:15 PM  Result Value Ref Range Status   Specimen Description BLOOD RIGHT HAND  Final   Special Requests   Final    BOTTLES DRAWN AEROBIC  ONLY Blood Culture results may not be optimal due to an inadequate volume of blood received in culture bottles   Culture NO GROWTH 5 DAYS  Final   Report Status 03/27/2017 FINAL  Final  Culture, blood (routine x 2)     Status: None   Collection Time: 03/22/17  2:20 PM  Result Value Ref Range Status   Specimen Description BLOOD RIGHT HAND  Final   Special Requests   Final    BOTTLES DRAWN AEROBIC ONLY Blood Culture adequate volume   Culture NO GROWTH 5 DAYS  Final   Report Status 03/27/2017 FINAL  Final    Coagulation Studies: No results for input(s): LABPROT, INR in the last 72 hours.  Urinalysis: No results for input(s): COLORURINE, LABSPEC, PHURINE, GLUCOSEU, HGBUR, BILIRUBINUR, KETONESUR, PROTEINUR, UROBILINOGEN, NITRITE, LEUKOCYTESUR in the last 72 hours.  Invalid input(s): APPERANCEUR    Imaging: [name] Contrast  Result Date: 03/28/2017 CLINICAL DATA:   Focal neuro deficits greater than 6 hours. Recent stroke. Question new stroke. EXAM: MRI HEAD WITHOUT CONTRAST TECHNIQUE: Multiplanar, multiecho pulse sequences of the brain and surrounding structures were obtained without intravenous contrast. COMPARISON:  MRI of the brain 03/20/2017.  CT head 03/19/2017. FINDINGS: Brain: Diffusion-weighted images again demonstrate the subacute nonhemorrhagic 1 cm left cerebellar infarct. Scattered foci of chronic microhemorrhage are again seen. The largest areas in the left lentiform nucleus. These are better visualized on today's study with less motion. Multiple remote lacunar infarcts of the basal ganglia are stable. Remote thalamic infarcts are present. White matter changes extend into the brainstem. Remote infarcts are again seen within the corona radiata bilaterally. Ventricles are proportionate to the degree of atrophy. No significant extra-axial fluid collection is present. Vascular: Flow is present in the major intracranial arteries. Skull and upper cervical spine: The skull base is within normal limits. The craniocervical junction is normal. Slight anterolisthesis is present at C3-4. Marrow signal is diffusely depressed. Is likely related to anemia and end-stage renal disease. Sinuses/Orbits: The paranasal sinuses are clear. There is some fluid in the left mastoid air cells. Globes and orbits are within normal limits. IMPRESSION: 1. Stable subacute 10 mm infarct involving the left cerebellum. 2. No other acute infarct. 3. Multiple remote lacunar infarcts of the basal ganglia and corona radiata are stable. 4. Multiple foci of chronic microhemorrhage are stable. 5. Advanced atrophy and white matter disease. Electronically Signed   By: San Morelle M.D.   On: 03/28/2017 10:21     Medications:   . sodium chloride 50 mL/hr at 03/24/17 1446  . sodium chloride 50 mL/hr at 03/28/17 0253   . aspirin  300 mg Rectal Daily   Or  . aspirin  325 mg Oral Daily  .  atorvastatin  80 mg Oral q1800  . carvedilol  3.125 mg Oral BID WC  . dronabinol  2.5 mg Oral QAC lunch  . feeding supplement (ENSURE ENLIVE)  237 mL Oral BID BM  . thiamine  100 mg Oral Daily   acetaminophen **OR** acetaminophen, antiseptic oral rinse, polyvinyl alcohol, RESOURCE THICKENUP CLEAR  Assessment/ Plan:  1. ESRD MWF [facility] 2. New Small CerebellarischemicCVA 3. AMS with Dementia 4. Progressive decline/FTT   May need feeding tube  5. Persistent tachycardia 6. Traumatic SDH 01/2017 seen at [facility] 7. Chronic sCHF 8. CAD 9. HTN, stable 10. Anemia, stable 11. CKD BMD: P - 6.9  No binders  No oral intake  12. ? Seizures   continue dialysis and will need [facility] or  [facility] placement  - nutrition is going to have to be addressed at some point  Patient stable and seen on dialysis    LOS: 9 [place] Yanik W @TODAY @10 :41 AM

## 2017-03-29 LAB — GLUCOSE, CAPILLARY
GLUCOSE-CAPILLARY: 199 mg/dL — AB (ref 65–99)
GLUCOSE-CAPILLARY: 177 mg/dL — AB (ref 65–99)
Glucose-Capillary: 214 mg/dL — ABNORMAL HIGH (ref 65–99)

## 2017-03-29 MED ORDER — VALPROATE SODIUM 500 MG/5ML IV SOLN
500.0000 mg | Freq: Two times a day (BID) | INTRAVENOUS | Status: DC
  Administered 2017-03-29 – 2017-04-02 (×5): 500 mg via INTRAVENOUS
  Filled 2017-03-29 (×9): qty 5

## 2017-03-29 MED ORDER — CHLORHEXIDINE GLUCONATE CLOTH 2 % EX PADS
6.0000 | MEDICATED_PAD | Freq: Every day | CUTANEOUS | Status: AC
Start: 2017-03-29 — End: 2017-03-31
  Administered 2017-03-29 – 2017-03-31 (×2): 6 via TOPICAL
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
## 2017-03-29 NOTE — Progress Notes (Signed)
MRSA PCR completed 03/20/17 NEGATIVE.
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
## 2017-03-29 NOTE — Progress Notes (Signed)
Subjective: Son reports waxing and waning of symptoms.   Exam: Vitals:   03/29/17 0420 03/29/17 0900  BP: 130/71 132/74  Pulse: 98 91  Resp: 16 20  Temp: 98.5 F (36.9 C) 98.1 F (36.7 C)  SpO2: 100% 100%   Gen: In bed, NAD Resp: non-labored breathing, no acute distress Abd: soft, nt  Neuro: MS: Awake, states he is going to "knock me out."  And tells me to "Get the hell out of here" CN: Fixates and tracks across midline Motor: Right arm weakness(from old stroke), does not cooperate with formal testing.  Sensory: He responds to stimulation bilaterally  MRI reviewed-unchanged  Impression:  72 year old male with acute altered mental status in the setting of vascular dementia.  I agree that the cerebellar infarct is not adequate to explain his overall confusion.  He has had progressive failure to thrive, but the abrupt change over the past week does make me think there may be some chance of recovery to the level of function that he was experiencing in between his December and January hospitalizations.  Also, he has had significant weight loss, one consideration could be thiamine deficiency as a contributor.  I suspect that this is primarily multifactorial delirium in the setting of failure to thrive and underlying dementia.  If family wishes to pursue G-tube, it is possible that he could have slow continued improvement towards baseline.  This is not definite, however, and I discussed that with the son.  With report of waxing and waning and previous concern for seizures, I think that an overnight EEG to assess for  Subclinical seizures would be reasonable. Also he was supposed to be on depakote but had not gotten it filled at the time of readmission.   Recommendations: 1) agree with continued discussions with palliative care 2) Overnight EEG in the AM 3) start on depakote 500mg  BID as per previous recommendations.   [name], MD [facility] [phone]  If 7pm- 7am, please page neurology on call as listed in [place].

## 2017-03-30 ENCOUNTER — Telehealth: Payer: Self-pay | Admitting: *Deleted

## 2017-03-30 LAB — GLUCOSE, CAPILLARY
GLUCOSE-CAPILLARY: 162 mg/dL — AB (ref 65–99)
Glucose-Capillary: 168 mg/dL — ABNORMAL HIGH (ref 65–99)
Glucose-Capillary: 254 mg/dL — ABNORMAL HIGH (ref 65–99)
Glucose-Capillary: 217 mg/dL — ABNORMAL HIGH (ref 65–99)

## 2017-03-30 MED ORDER — INSULIN ASPART 100 UNIT/ML ~~LOC~~ SOLN
0.0000 [IU] | Freq: Three times a day (TID) | SUBCUTANEOUS | Status: DC
  Administered 2017-03-31: 1 [IU] via SUBCUTANEOUS
  Administered 2017-04-01 – 2017-04-02 (×2): 2 [IU] via SUBCUTANEOUS
  Administered 2017-04-02: 1 [IU] via SUBCUTANEOUS

## 2017-03-30 NOTE — Progress Notes (Addendum)
  [facility] Progress Note   Assessment/ Plan:   1. ESRD MWF [facility]- for HD today.  Will monitor for aggressive behaviors in dialysis.  [facility]. 2. New Small CerebellarischemicCVA 3. AMS with Dementia 4. Progressive decline/FTT--> PEG requested with IR.  On Marinol 5. Anemia- Hgb 10.9, no ESA 6. BMD: no real intake, no binders or calcitriol 7. Nutrition: as above in #4 8. Dispo: pending  ADDEND: unable to dialyze due to aggressive behaviors.  Pt became aggressive on attempted cannulation.  We will attempt tomorrow and then if behaviors are exhibited again will need to re-address [place].   Subjective:    For HD today.  He did not display any aggressive behaviors for me today.  PEG placement has been requested.     Objective:   BP (!) 156/98 (BP Location: Right Wrist)   Pulse 89   Temp (!) 97.5 F (36.4 C) (Axillary)   Resp 20   Ht 5\' 7"  (1.702 m)   Wt 51 kg (112 lb 7 oz)   SpO2 98%   BMI 17.61 kg/m   Physical Exam: Gen: older gentleman, sitting in bed, NAD CVS: RRR  Resp: unlabored and clear Abd: nontender nondistended Ext: no LE edema ACCESS: L RC fistula + T/B PSYCH: no aggressive behaviors on my exam today  Labs: BMET Recent Labs  Lab 03/25/17 0720 03/27/17 0745  NA 141 141  K 4.3 4.3  CL 101 103  CO2 23 20*  GLUCOSE 152* 118*  BUN 68* 71*  CREATININE 10.93* 10.17*  CALCIUM 8.7* 8.5*  PHOS 6.9* 7.6*   CBC Recent Labs  Lab 03/25/17 0720 03/27/17 0745  WBC 13.9* 8.1  HGB 11.1* 10.9*  HCT 33.9* 33.4*  MCV 94.4 94.4  PLT 211 167    @IMGRELPRIORS @ Medications:    . aspirin  300 mg Rectal Daily   Or  . aspirin  325 mg Oral Daily  . atorvastatin  80 mg Oral q1800  . carvedilol  3.125 mg Oral BID WC  . Chlorhexidine Gluconate Cloth  6 each Topical Q0600  . dronabinol  2.5 mg Oral QAC lunch  . feeding supplement (ENSURE ENLIVE)  237 mL Oral BID BM  . thiamine  100 mg Oral Daily     Madelon Lips MD [facility] pgr [phone] 03/30/2017, 1:13 PM

## 2017-03-30 NOTE — Consult Note (Signed)
Chief Complaint: Patient was seen in consultation today for percutaneous gastric tube placement Chief Complaint  Patient presents with  . Cerebrovascular Accident   at the request of Dr Ernie Hew And Dr Algis Liming   Supervising Physician: Arne Cleveland  Patient Status: [facility] - In-pt  History of Present Illness: [name] is a 72 y.o. male   ESRD; HTN; DM Traumatic SDH Encephalopathy New CVA Dysphagia; PCM FTT; deconditioning Request for percutaneous gastric tube placement  Imaging reviewed and procedure technically approved Pt family has now decided to move ahead with G tube Scheduled for 1/15 in IR  Past Medical History:  Diagnosis Date  . 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 Anemia, iron deficiency 03/24/2011   a. Recurrent GI bleed, tx with periodic iron infusions.  . 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 AVM (arteriovenous malformation) of duodenum, acquired    egds in 01/2012, 04/2011  . BPH (benign prostatic hyperplasia)   . CAD (coronary artery disease)    a. Cath 09/2012: moderate borderline CAD in mid LAD/small diagonal branch, mild RCA stenosis, to be managed medically   . Chronic lower back pain   . Diabetic peripheral neuropathy ([place]) 2014   foot pain.  . Essential hypertension 02/01/2012  . Fatty liver    on CT of 11/2010  . GERD (gastroesophageal reflux disease)   . GI bleed    a. Recurrent GI bleed, tx with IV iron. b. Per heme notes - likely AVMs 04/2012 (tx with cauterization several months ago).  . Hematuria    a. Urology note scan from 07/2012: cystoscopy without evidence for bladder lesion, only lateral hypertrophy of posterior urethra, bladder impression from BPH. b. Pt states he had "some tests" scheduled for later in July 2014.  . High cholesterol   . Hyperlipidemia 02/01/2012  . Hypertension   . Hypertensive urgency 07/06/2016  . 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 Orthostatic hypotension    a. Tx with  florinef.  . Peripheral neuropathy   . Polyp, colonic    Colonoscopy 01/2012 "benign" polyp  . Prostate cancer ([place])   . Sleep apnea    "had mask; couldn't sleep in it" (09/20/2012)  . Smoker   . SOB (shortness of breath) 07/06/2016  . Stage III chronic kidney disease ([name])    a. Stage 3 (DM with complications ->CKD, peripheral neuropathy).  . Stroke ([place])    x 2  . Symptomatic anemia 04/03/2015  . Tinea pedis 05/24/2012  . Tobacco use 02/01/2012  . Type 2 diabetes, uncontrolled, with neuropathy ([place]) 02/01/2012  . Type II diabetes mellitus ([place])    a. Dx 1994, uncontrolled.     Past Surgical History:  Procedure Laterality Date  . ENTEROSCOPY N/A 03/01/2013   Procedure: ENTEROSCOPY;  Surgeon: Inda Castle, MD;  Location: [facility];  Service: Endoscopy;  Laterality: N/A;  . ENTEROSCOPY N/A 06/26/2016   Procedure: ENTEROSCOPY;  Surgeon: Milus Banister, MD;  Location: [place];  Service: Endoscopy;  Laterality: N/A;  this is a push enteroscopy  . ENTEROSCOPY N/A 10/23/2016   Procedure: ENTEROSCOPY;  Surgeon: Gatha Mayer, MD;  Location: [facility] ENDOSCOPY;  Service: Endoscopy;  Laterality: N/A;  . GIVENS CAPSULE STUDY N/A 04/05/2015   Procedure: GIVENS CAPSULE STUDY;  Surgeon: Gatha Mayer, MD;  Location: [place];  Service: Endoscopy;  Laterality: N/A;  . INGUINAL HERNIA REPAIR Right 2011  . LEFT HEART CATH AND CORONARY ANGIOGRAPHY N/A 12/23/2016   Procedure: LEFT HEART CATH AND  CORONARY ANGIOGRAPHY;  Surgeon: Leonie Man, MD;  Location: [place] [facility];  Service: Cardiovascular;  Laterality: N/A;  . LEFT HEART CATHETERIZATION WITH CORONARY ANGIOGRAM N/A 09/21/2012   Procedure: LEFT HEART CATHETERIZATION WITH CORONARY ANGIOGRAM;  Surgeon: Peter M Martinique, MD;  Location: [facility] CATH LAB;  Service: Cardiovascular;  Laterality: N/A;  . LUMBAR DISC SURGERY  1980's  . LYMPHADENECTOMY Bilateral 01/19/2013   Procedure: LYMPHADENECTOMY;  Surgeon: Bernestine Amass, MD;  Location: [facility];   Service: Urology;  Laterality: Bilateral;  . ROBOT ASSISTED LAPAROSCOPIC RADICAL PROSTATECTOMY N/A 01/19/2013   Procedure: ROBOTIC ASSISTED LAPAROSCOPIC RADICAL PROSTATECTOMY;  Surgeon: Bernestine Amass, MD;  Location: [facility];  Service: Urology;  Laterality: N/A;  . SHOULDER OPEN ROTATOR CUFF REPAIR Left 1980's    Allergies: Patient has no known allergies.  Medications: Prior to Admission medications   Medication Sig Start Date End Date Taking? Authorizing Provider  ARTIFICIAL TEAR SOLUTION OP Instill 1-2 drops into both eyes two to three times a day as needed for dry eyes   Yes [provider]  atorvastatin (LIPITOR) 80 MG tablet Take 80 mg by mouth at bedtime.   Yes [provider]  carvedilol (COREG) 3.125 MG tablet Take 3.125 mg by mouth 2 (two) times daily with a meal.   Yes [provider]  dronabinol (MARINOL) 2.5 MG capsule Take 2.5 mg by mouth once a day 03/06/17  Yes [provider]  multivitamin (RENA-VIT) TABS tablet Take 1 tablet by mouth every Monday, Wednesday, and Friday.    Yes [provider]  ranitidine (ZANTAC) 150 MG capsule Take 1 capsule (150 mg total) by mouth 2 (two) times daily. 01/06/17  Yes Saguier, Percell Miller, PA-C  venlafaxine (EFFEXOR) 37.5 MG tablet Take 37.5 mg by mouth every morning.   Yes [provider]     Family History  Problem Relation Age of Onset  . Stroke Father        Died at 8  . Diabetes Mother   . Heart attack Brother        Died at 61  . Diabetes Sister   . Stomach cancer Brother   . Heart attack Sister     Social History   Socioeconomic History  . Marital status: Married    Spouse name: None  . Number of children: 2  . Years of education: None  . Highest education level: None  Social Needs  . Financial resource strain: None  . Food insecurity - worry: None  . Food insecurity - inability: None  . Transportation needs - medical: None  . Transportation needs - non-medical: None   Occupational History  . Occupation: taken out of work due to back  Tobacco Use  . Smoking status: Current Every Day Smoker    Packs/day: 0.50    Years: 30.00    Pack years: 15.00    Types: Cigarettes    Start date: 04/15/1968  . Smokeless tobacco: Never Used  . Tobacco comment: start back a couple of months ago  Substance and Sexual Activity  . Alcohol use: No    Alcohol/week: 0.0 oz    Comment: 09/20/2012 "Used to; stopped ~ 2009; never had problem w/it"  . Drug use: No  . Sexual activity: No  Other Topics Concern  . None  Social History Narrative   Regular exercise: rides horses   Caffeine use: occasionally     Review of Systems: A 12 point ROS discussed and pertinent positives are indicated in  the HPI above.  All other systems are negative.  Review of Systems  Constitutional: Positive for activity change. Negative for fatigue and fever.  Respiratory: Negative for cough and shortness of breath.   Gastrointestinal: Negative for abdominal pain.  Psychiatric/Behavioral: Positive for confusion.    Vital Signs: BP (!) 156/98 (BP Location: Right Wrist)   Pulse 89   Temp (!) 97.5 F (36.4 C) (Axillary)   Resp 20   Ht 5\' 7"  (1.702 m)   Wt 112 lb 7 oz (51 kg)   SpO2 98%   BMI 17.61 kg/m   Physical Exam  Cardiovascular: Normal rate and regular rhythm.  Pulmonary/Chest: Effort normal and breath sounds normal.  Abdominal: Soft.  Musculoskeletal: Normal range of motion.  Neurological:  Can follow all commands confused  Skin: Skin is warm and dry.  Psychiatric:  Consented wife via phone  Nursing note and vitals reviewed.   Imaging: Ct Head Wo Contrast  Result Date: 03/19/2017 CLINICAL DATA:  72 year old male for follow-up of subdural hematoma. Unchanged altered mental status and encephalopathy. EXAM: CT HEAD WITHOUT CONTRAST TECHNIQUE: Contiguous axial images were obtained from the base of the skull through the vertex without intravenous contrast. COMPARISON:   03/12/2017 MR, 03/10/2017 CT and prior studies FINDINGS: Brain: The right posterior fossa subdural collection is no longer visualized. A new 1 cm hypodensity within the left cerebellum (image 7 -8) likely represents new ischemic changes/infarct or edema. Atrophy, chronic small-vessel white matter ischemic changes and remote bilateral basal ganglia and thalamic infarcts again noted. No new hemorrhage or extra-axial collection noted. Vascular: Atherosclerotic calcifications noted Skull: No acute abnormality Sinuses/Orbits: No acute abnormality Other: None IMPRESSION: 1. New 1 cm left cerebellar hypodensity which may represent ischemic changes/infarct/edema. No hemorrhage. 2. Interval resolution of right posterior fossa subdural collection. No evidence of new hemorrhage or extra-axial collection. 3. Atrophy, chronic small-vessel white matter ischemic changes and remote basal ganglia/thalamic infarcts. Electronically Signed   By: Margarette Canada M.D.   On: 03/19/2017 15:32   Ct Head Wo Contrast  Result Date: 03/10/2017 CLINICAL DATA:  Altered mental status for 2 days. EXAM: CT HEAD WITHOUT CONTRAST TECHNIQUE: Contiguous axial images were obtained from the base of the skull through the vertex without intravenous contrast. COMPARISON:  02/01/2017 FINDINGS: Brain: Left cerebral hemisphere subdural blood has resolved from the previous examination. However, there is a new extra-axial low-density collection adjacent to the inferior right cerebellar hemisphere. This is seen on sequence 3, image 7 and best seen on the coronal reformats, sequence 5, image 50. This low-density structure is mildly heterogeneous. This structure measures 2.6 x 1.0 x 0.7 cm. There is no significant mass effect from this new extra-axial collection. No evidence for midline shift, hydrocephalus or large infarct. Again noted is extensive white matter disease with old ischemic changes and remote lacune infarcts in the deep white matter tracts and deep  gray nuclei. Vascular: No hyperdense vessel or unexpected calcification. Skull: Normal. Negative for fracture or focal lesion. Sinuses/Orbits: No acute finding. Other: None. IMPRESSION: New small subdural collection in the right posterior fossa. Findings are suggestive for a subacute subdural hematoma or hygroma. The left cerebral hemisphere subdural hematoma has resolved since 02/01/2017. Chronic white matter changes compatible with small vessel ischemic disease and remote lacune infarcts. Electronically Signed   By: Markus Daft M.D.   On: 03/10/2017 15:46   [name] Contrast  Result Date: 03/28/2017 CLINICAL DATA:  Focal neuro deficits greater than 6 hours. Recent stroke. Question new stroke.  EXAM: MRI HEAD WITHOUT CONTRAST TECHNIQUE: Multiplanar, multiecho pulse sequences of the brain and surrounding structures were obtained without intravenous contrast. COMPARISON:  MRI of the brain 03/20/2017.  CT head 03/19/2017. FINDINGS: Brain: Diffusion-weighted images again demonstrate the subacute nonhemorrhagic 1 cm left cerebellar infarct. Scattered foci of chronic microhemorrhage are again seen. The largest areas in the left lentiform nucleus. These are better visualized on today's study with less motion. Multiple remote lacunar infarcts of the basal ganglia are stable. Remote thalamic infarcts are present. White matter changes extend into the brainstem. Remote infarcts are again seen within the corona radiata bilaterally. Ventricles are proportionate to the degree of atrophy. No significant extra-axial fluid collection is present. Vascular: Flow is present in the major intracranial arteries. Skull and upper cervical spine: The skull base is within normal limits. The craniocervical junction is normal. Slight anterolisthesis is present at C3-4. Marrow signal is diffusely depressed. Is likely related to anemia and end-stage renal disease. Sinuses/Orbits: The paranasal sinuses are clear. There is some fluid in the  left mastoid air cells. Globes and orbits are within normal limits. IMPRESSION: 1. Stable subacute 10 mm infarct involving the left cerebellum. 2. No other acute infarct. 3. Multiple remote lacunar infarcts of the basal ganglia and corona radiata are stable. 4. Multiple foci of chronic microhemorrhage are stable. 5. Advanced atrophy and white matter disease. Electronically Signed   By: San Morelle M.D.   On: 03/28/2017 10:21   [name] Contrast  Result Date: 03/20/2017 CLINICAL DATA:  Initial evaluation for acute altered mental status, possible stroke. EXAM: MRI HEAD WITHOUT CONTRAST MRA HEAD WITHOUT CONTRAST TECHNIQUE: Multiplanar, multiecho pulse sequences of the brain and surrounding structures were obtained without intravenous contrast. Angiographic images of the head were obtained using MRA technique without contrast. COMPARISON:  Prior CT from 03/19/2017 as well as recent MRI from 03/12/2017. FINDINGS: MRI HEAD FINDINGS Brain: Advanced parenchymal volume loss. Chronic microvascular ischemic disease with scatter remote lacunar infarcts involving the bilateral basal ganglia and thalami and as well as the pons, stable from previous. 1 cm acute ischemic nonhemorrhagic infarct present within the mid left cerebellar hemisphere, corresponding to previously noted hypodensity seen on prior CT (series 3, image 9). No associated mass effect. For no other evidence for acute or subacute ischemia. Gray-white matter differentiation otherwise maintained. No acute intracranial hemorrhage. Multiple remote chronic micro hemorrhages again noted, consistent with chronic hypertensive encephalopathy. No mass lesion, midline shift or mass effect. Ventricular prominence related to global parenchymal volume loss without hydrocephalus. Previously noted subdural collection at the right posterior fossa is nearly resolved now measuring up to 2 mm in maximal thickness without significant mass effect. No other new extra-axial  collection. Major dural sinuses are grossly patent. Vascular: Major intracranial vascular flow voids maintained at the skullbase. Skull and upper cervical spine: Craniocervical junction normal. Bone marrow signal intensity within normal limits. No scalp soft tissue abnormality. Sinuses/Orbits: Globes orbital soft tissues within normal limits. Paranasal sinuses are largely clear. No significant mastoid effusion. Inner ear structures normal. Other: None. MRA HEAD FINDINGS ANTERIOR CIRCULATION: Distal cervical segments of the internal carotid arteries are patent with antegrade flow. Petrous, cavernous, and supraclinoid segments patent bilaterally without flow-limiting stenosis. Mild atheromatous irregularity within the carotid siphons. A1 segments, anterior communicating artery common anterior cerebral arteries widely patent. Atheromatous irregularity throughout the M1 segments and distal MCA branches without high-grade stenosis, slightly worse on the right. POSTERIOR CIRCULATION: Visualized vertebral arteries patent to the vertebrobasilar junction without stenosis. Partially visualized  posterior inferior cerebral artery is patent. Basilar patent to its distal aspect without stenosis. Superior cerebral arteries and posterior cerebral artery is patent bilaterally without flow-limiting stenosis. Distal small vessel atheromatous irregularity within the PCA branches bilaterally. No aneurysm. IMPRESSION: MRI HEAD IMPRESSION: 1. 1 cm acute ischemic nonhemorrhagic infarct positioned at the central left cerebellar hemisphere. 2. Near interval resolution of previously seen right posterior fossa subdural collection, now measuring up to 2 mm in thickness without mass effect. 3. A advanced parenchymal volume loss with chronic micro vessel ischemic disease in scatter remote lacunar infarcts as above. 4. Multiple chronic micro hemorrhages throughout the brain, consistent with underlying chronic hypertension. MRA HEAD IMPRESSION:  Intracranial atherosclerosis as above without large or proximal arterial branch occlusion. No high-grade or correctable stenosis within the intracranial circulation. Electronically Signed   By: Jeannine Boga M.D.   On: 03/20/2017 03:07   [name] Contrast  Result Date: 03/12/2017 CLINICAL DATA:  Altered level consciousness. Fell 1 month ago resulting in subdural hematoma. History of end-stage renal disease on dialysis, hypertension, diabetes, hyperlipidemia. EXAM: MRI HEAD WITHOUT CONTRAST TECHNIQUE: Multiplanar, multiecho pulse sequences of the brain and surrounding structures were obtained without intravenous contrast. COMPARISON:  CT HEAD March 10, 2017 and MRI of the head January 20, 2017 FINDINGS: BRAIN: No reduced diffusion to suggest acute ischemia. Numerous scattered supra and infratentorial microhemorrhage predominately in central sister fusion. Old LEFT basal ganglia infarct with susceptibility artifact. Old RIGHT basal ganglia lacunar infarct and multiple thalamus lacunar infarcts. Prominent basal ganglia and thalamus perivascular spaces assisted with chronic small vessel ischemic disease. Patchy to confluent supratentorial pontine white matter T2 hyperintensities. Old small bilateral cerebellar infarcts. Moderate to severe parenchymal brain volume loss for age. No hydrocephalus. Small T2 bright RIGHT posterior fossa extra-axial collection without mass effect. Resolution of supratentorial subdural collections. Mild dural thickening associated with prior sub dural hematoma. VASCULAR: Normal major intracranial vascular flow voids present at skull base. SKULL AND UPPER CERVICAL SPINE: No abnormal sellar expansion. 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 Craniocervical junction maintained. SINUSES/ORBITS: The mastoid air-cells and included paranasal sinuses are well-aerated. The included ocular globes and orbital contents are non-suspicious. OTHER: None.  IMPRESSION: 1. No acute intracranial process. 2. Small RIGHT posterior fossa subdural collection as seen on recent CT without mass effect. Resolution of supratentorial subdural hematoma. 3. Moderate to severe parenchymal brain volume loss. 4. Moderate to severe chronic small vessel ischemic disease. Multiple old small vessel infarcts. 5. Extensive micro hemorrhages associated with chronic hypertension. Electronically Signed   By: Elon Alas M.D.   On: 03/12/2017 16:20   Dg Chest Port 1 View  Result Date: 03/22/2017 CLINICAL DATA:  72 year old male with cough EXAM: PORTABLE CHEST 1 VIEW COMPARISON:  Prior chest x-ray 03/10/2017 FINDINGS: Stable cardiac mediastinal contours. Atherosclerotic calcifications again noted in the transverse aorta. The lungs are clear. Minimal chronic bronchitic changes are stable. No acute osseous abnormality. No pneumothorax or pleural effusion. IMPRESSION: 1. No acute cardiopulmonary process. 2.  Aortic Atherosclerosis (ICD10-170.0) Electronically Signed   By: Jacqulynn Cadet M.D.   On: 03/22/2017 14:34   Dg Chest Port 1 View  Result Date: 03/10/2017 CLINICAL DATA:  Altered mental status EXAM: PORTABLE CHEST 1 VIEW COMPARISON:  12/22/2016 FINDINGS: Mild cardiomegaly. No focal consolidation or effusion. Mild aortic atherosclerosis. No pneumothorax. IMPRESSION: Cardiomegaly.  Negative for edema or infiltrate. Electronically Signed   By: Donavan Foil M.D.   On: 03/10/2017 16:21   Mr Jodene Nam Head Wo Contrast  Result Date: 03/20/2017 CLINICAL DATA:  Initial evaluation for acute altered mental status, possible stroke. EXAM: MRI HEAD WITHOUT CONTRAST MRA HEAD WITHOUT CONTRAST TECHNIQUE: Multiplanar, multiecho pulse sequences of the brain and surrounding structures were obtained without intravenous contrast. Angiographic images of the head were obtained using MRA technique without contrast. COMPARISON:  Prior CT from 03/19/2017 as well as recent MRI from 03/12/2017. FINDINGS: MRI  HEAD FINDINGS Brain: Advanced parenchymal volume loss. Chronic microvascular ischemic disease with scatter remote lacunar infarcts involving the bilateral basal ganglia and thalami and as well as the pons, stable from previous. 1 cm acute ischemic nonhemorrhagic infarct present within the mid left cerebellar hemisphere, corresponding to previously noted hypodensity seen on prior CT (series 3, image 9). No associated mass effect. For no other evidence for acute or subacute ischemia. Gray-white matter differentiation otherwise maintained. No acute intracranial hemorrhage. Multiple remote chronic micro hemorrhages again noted, consistent with chronic hypertensive encephalopathy. No mass lesion, midline shift or mass effect. Ventricular prominence related to global parenchymal volume loss without hydrocephalus. Previously noted subdural collection at the right posterior fossa is nearly resolved now measuring up to 2 mm in maximal thickness without significant mass effect. No other new extra-axial collection. Major dural sinuses are grossly patent. Vascular: Major intracranial vascular flow voids maintained at the skullbase. Skull and upper cervical spine: Craniocervical junction normal. Bone marrow signal intensity within normal limits. No scalp soft tissue abnormality. Sinuses/Orbits: Globes orbital soft tissues within normal limits. Paranasal sinuses are largely clear. No significant mastoid effusion. Inner ear structures normal. Other: None. MRA HEAD FINDINGS ANTERIOR CIRCULATION: Distal cervical segments of the internal carotid arteries are patent with antegrade flow. Petrous, cavernous, and supraclinoid segments patent bilaterally without flow-limiting stenosis. Mild atheromatous irregularity within the carotid siphons. A1 segments, anterior communicating artery common anterior cerebral arteries widely patent. Atheromatous irregularity throughout the M1 segments and distal MCA branches without high-grade stenosis,  slightly worse on the right. POSTERIOR CIRCULATION: Visualized vertebral arteries patent to the vertebrobasilar junction without stenosis. Partially visualized posterior inferior cerebral artery is patent. Basilar patent to its distal aspect without stenosis. Superior cerebral arteries and posterior cerebral artery is patent bilaterally without flow-limiting stenosis. Distal small vessel atheromatous irregularity within the PCA branches bilaterally. No aneurysm. IMPRESSION: MRI HEAD IMPRESSION: 1. 1 cm acute ischemic nonhemorrhagic infarct positioned at the central left cerebellar hemisphere. 2. Near interval resolution of previously seen right posterior fossa subdural collection, now measuring up to 2 mm in thickness without mass effect. 3. A advanced parenchymal volume loss with chronic micro vessel ischemic disease in scatter remote lacunar infarcts as above. 4. Multiple chronic micro hemorrhages throughout the brain, consistent with underlying chronic hypertension. MRA HEAD IMPRESSION: Intracranial atherosclerosis as above without large or proximal arterial branch occlusion. No high-grade or correctable stenosis within the intracranial circulation. Electronically Signed   By: Jeannine Boga M.D.   On: 03/20/2017 03:07    Labs:  CBC: Recent Labs    03/22/17 1054 03/23/17 0448 03/25/17 0720 03/27/17 0745  WBC 13.9* 16.7* 13.9* 8.1  HGB 13.6 12.9* 11.1* 10.9*  HCT 40.9 39.9 33.9* 33.4*  PLT 224 221 211 167    COAGS: Recent Labs    06/23/16 1638 10/21/16 2013 12/23/16 0336 03/10/17 1506 03/19/17 1759  INR 1.08 1.07 0.99 1.05 1.09  APTT 57* 55*  --  55* 35    BMP: Recent Labs    03/22/17 1054 03/23/17 0448 03/25/17 0720 03/27/17 0745  NA 135 139 141 141  K 5.2* 5.1  4.3 4.3  CL 96* 96* 101 103  CO2 23 20* 23 20*  GLUCOSE 162* 139* 152* 118*  BUN 46* 59* 68* 71*  CALCIUM 9.5 9.1 8.7* 8.5*  CREATININE 10.93* 12.79* 10.93* 10.17*  GFRNONAA 4* 3* 4* 4*  GFRAA 5* 4* 5* 5*     LIVER FUNCTION TESTS: Recent Labs    03/11/17 0631 03/19/17 1759 03/20/17 0246 03/22/17 1054 03/25/17 0720 03/27/17 0745  BILITOT 0.8 0.6 0.8 1.2  --   --   AST 11* 12* 7* 12*  --   --   ALT 10* 10* 8* 8*  --   --   ALKPHOS 97 90 89 97  --   --   PROT 7.3 7.8 7.4 8.5*  --   --   ALBUMIN 3.4* 3.7 3.7 3.8 2.9* 2.8*    TUMOR MARKERS: No results for input(s): AFPTM, CEA, CA199, CHROMGRNA in the last 8760 hours.  Assessment and Plan:  New CVA Dysphagia Failure to thrive; protein calorie malnutrition Need for long term care Scheduled for percutaneous gastric tube placement Risks and benefits discussed with the patient's wife via phone including, but not limited to the need for a barium enema during the procedure, bleeding, infection, peritonitis, or damage to adjacent structures. All of her questions were answered, she is agreeable to proceed. Consent signed and in chart.   Thank you for this interesting consult.  I greatly enjoyed meeting [name] and look forward to participating in their care.  A copy of this report was sent to the requesting provider on this date.  Electronically Signed: Lavonia Drafts, PA-C 03/30/2017, 9:52 AM   I spent a total of 40 Minutes    in face to face in clinical consultation, greater than 50% of which was counseling/coordinating care for percutaneous gastric tube placement

## 2017-03-30 NOTE — Progress Notes (Signed)
LTM EEG not able to be done today due to equipment limitations and scheduling. Dr Cheral Marker aware.

## 2017-03-30 NOTE — Progress Notes (Signed)
RN called family to confirm desire for peg placement. Family has agreed, IR notified. Will notify MD

## 2017-03-30 NOTE — Telephone Encounter (Signed)
Received Home Health Certification and Plan of Care; forwarded to provider/SLS 01/14  

## 2017-03-30 NOTE — Progress Notes (Signed)
Rehab admissions - Noted plans for possible LTACH for rehab.  I will sign off for acute inpatient rehab at this time.  Call me for questions.  #[phone]

## 2017-03-30 NOTE — Progress Notes (Signed)
Subjective: Patient without complaints. States family will be visiting him today.  Objective: Current vital signs: BP (!) 156/98 (BP Location: Right Wrist)   Pulse 89   Temp (!) 97.5 F (36.4 C) (Axillary)   Resp 20   Ht 5\' 7"  (1.702 m)   Wt 51 kg (112 lb 7 oz)   SpO2 98%   BMI 17.61 kg/m  Vital signs in last 24 hours: Temp:  [97.5 F (36.4 C)-98.5 F (36.9 C)] 97.5 F (36.4 C) (01/14 0420) Pulse Rate:  [85-97] 89 (01/14 0420) Resp:  [20] 20 (01/13 2104) BP: (131-161)/(72-98) 156/98 (01/14 0420) SpO2:  [96 %-100 %] 98 % (01/14 0420)  Intake/Output from previous day: 01/13 0701 - 01/14 0700 In: 480 [P.O.:230; I.V.:250] Out: -  Intake/Output this shift: No intake/output data recorded. Nutritional status: DIET - DYS 1 Room service appropriate? Yes; Fluid consistency: Nectar Thick  Neurologic Exam: Noncooperative with exam Ment: Dysarthric speech noted in the context of missing dentition. Speech is fluent. States that he does not want to be examined. Easily agitated. Poor attention.  CN: Eyes conjugate. Tends to track examiner poorly to right but normally to left.  Motor/Sensory: Refuses exam.   Lab Results: Results for orders placed or performed during the hospital encounter of 03/19/17 (from the past 48 hour(s))  Glucose, capillary     Status: Abnormal   Collection Time: 03/28/17 11:01 AM  Result Value Ref Range   Glucose-Capillary 155 (H) 65 - 99 mg/dL  Glucose, capillary     Status: Abnormal   Collection Time: 03/28/17  4:39 PM  Result Value Ref Range   Glucose-Capillary 267 (H) 65 - 99 mg/dL  Glucose, capillary     Status: Abnormal   Collection Time: 03/28/17  9:27 PM  Result Value Ref Range   Glucose-Capillary 155 (H) 65 - 99 mg/dL  Glucose, capillary     Status: Abnormal   Collection Time: 03/29/17 11:31 AM  Result Value Ref Range   Glucose-Capillary 199 (H) 65 - 99 mg/dL   Comment 1 Notify RN    Comment 2 Document in Chart   Glucose, capillary     Status:  Abnormal   Collection Time: 03/29/17  3:51 PM  Result Value Ref Range   Glucose-Capillary 214 (H) 65 - 99 mg/dL  Glucose, capillary     Status: Abnormal   Collection Time: 03/29/17  9:16 PM  Result Value Ref Range   Glucose-Capillary 177 (H) 65 - 99 mg/dL   Comment 1 Notify RN    Comment 2 Document in Chart   Glucose, capillary     Status: Abnormal   Collection Time: 03/30/17  7:11 AM  Result Value Ref Range   Glucose-Capillary 162 (H) 65 - 99 mg/dL   Comment 1 Notify RN    Comment 2 Document in Chart     Recent Results (from the past 240 hour(s))  MRSA PCR Screening     Status: None   Collection Time: 03/20/17 11:59 PM  Result Value Ref Range Status   MRSA by PCR NEGATIVE NEGATIVE Final    Comment:        The GeneXpert MRSA Assay (FDA approved for NASAL specimens only), is one component of a comprehensive MRSA colonization surveillance program. It is not intended to diagnose MRSA infection nor to guide or monitor treatment for MRSA infections.   Culture, blood (routine x 2)     Status: None   Collection Time: 03/22/17  2:15 PM  Result Value Ref Range  Status   Specimen Description BLOOD RIGHT HAND  Final   Special Requests   Final    BOTTLES DRAWN AEROBIC ONLY Blood Culture results may not be optimal due to an inadequate volume of blood received in culture bottles   Culture NO GROWTH 5 DAYS  Final   Report Status 03/27/2017 FINAL  Final  Culture, blood (routine x 2)     Status: None   Collection Time: 03/22/17  2:20 PM  Result Value Ref Range Status   Specimen Description BLOOD RIGHT HAND  Final   Special Requests   Final    BOTTLES DRAWN AEROBIC ONLY Blood Culture adequate volume   Culture NO GROWTH 5 DAYS  Final   Report Status 03/27/2017 FINAL  Final    Lipid Panel No results for input(s): CHOL, TRIG, HDL, CHOLHDL, VLDL, LDLCALC in the last 72 hours.  Studies/Results: [name] Contrast  Result Date: 03/28/2017 CLINICAL DATA:  Focal neuro deficits  greater than 6 hours. Recent stroke. Question new stroke. EXAM: MRI HEAD WITHOUT CONTRAST TECHNIQUE: Multiplanar, multiecho pulse sequences of the brain and surrounding structures were obtained without intravenous contrast. COMPARISON:  MRI of the brain 03/20/2017.  CT head 03/19/2017. FINDINGS: Brain: Diffusion-weighted images again demonstrate the subacute nonhemorrhagic 1 cm left cerebellar infarct. Scattered foci of chronic microhemorrhage are again seen. The largest areas in the left lentiform nucleus. These are better visualized on today's study with less motion. Multiple remote lacunar infarcts of the basal ganglia are stable. Remote thalamic infarcts are present. White matter changes extend into the brainstem. Remote infarcts are again seen within the corona radiata bilaterally. Ventricles are proportionate to the degree of atrophy. No significant extra-axial fluid collection is present. Vascular: Flow is present in the major intracranial arteries. Skull and upper cervical spine: The skull base is within normal limits. The craniocervical junction is normal. Slight anterolisthesis is present at C3-4. Marrow signal is diffusely depressed. Is likely related to anemia and end-stage renal disease. Sinuses/Orbits: The paranasal sinuses are clear. There is some fluid in the left mastoid air cells. Globes and orbits are within normal limits. IMPRESSION: 1. Stable subacute 10 mm infarct involving the left cerebellum. 2. No other acute infarct. 3. Multiple remote lacunar infarcts of the basal ganglia and corona radiata are stable. 4. Multiple foci of chronic microhemorrhage are stable. 5. Advanced atrophy and white matter disease. Electronically Signed   By: San Morelle M.D.   On: 03/28/2017 10:21    Medications:  Scheduled: . aspirin  300 mg Rectal Daily   Or  . aspirin  325 mg Oral Daily  . atorvastatin  80 mg Oral q1800  . carvedilol  3.125 mg Oral BID WC  . Chlorhexidine Gluconate Cloth  6 each  Topical Q0600  . dronabinol  2.5 mg Oral QAC lunch  . feeding supplement (ENSURE ENLIVE)  237 mL Oral BID BM  . thiamine  100 mg Oral Daily   Continuous: . sodium chloride 50 mL/hr at 03/30/17 0713  . sodium chloride 50 mL/hr at 03/28/17 1233  . valproate sodium Stopped (03/29/17 2254)   SEG:[id number] **OR** acetaminophen, antiseptic oral rinse, polyvinyl alcohol, RESOURCE THICKENUP CLEAR  Impression:   72 year old male with presenting with acute altered mental status in the setting of vascular dementia. The cerebellar infarct is not adequate to explain his overall confusion. He has had progressive failure to thrive, but the abrupt change over the past week does suggests there may be some chance of recovery to the level  of function that he was experiencing in between his December and January hospitalizations. Also, he has had significant weight loss, one consideration could be thiamine deficiency as a contributor.  1. Most likely this is primarily a multifactorial delirium in the setting of failure to thrive and underlying dementia.  If family wishes to pursue G-tube, it is possible that he could have slow continued improvement towards baseline. This is not definite, however, and Dr. [name] has discussed that with the son. 2. With report of waxing and waning and previous concern for seizures, I think that an overnight EEG to assess for subclinical seizures would be reasonable. Also he was supposed to be on Depakote but had not gotten it filled at the time of readmission.   Recommendations: 1. Agree with continued discussions with palliative care 2. Overnight EEG in the AM 3. Continue Depakote 500 mg BID.    LOS: 11 days   Electronically signed: Dr. Kerney Elbe 03/30/2017  8:23 AM

## 2017-03-30 NOTE — Progress Notes (Signed)
  Speech Language Pathology Treatment: Dysphagia;Cognitive-Linquistic  Patient Details Name: [name] MRN: [id number] DOB: May 04, 1945 Today's Date: 03/30/2017 Time: 1000-1030 SLP Time Calculation (min) (ACUTE ONLY): 30 min  Assessment / Plan / Recommendation Clinical Impression  Today [name] is alert and pleasant, communicating wants and needs with moderate word finding difficulty. Provided choices for pt to better express wants and needs. Pt talked to wife on the phone with appropriate automaticity with greeting, words of affection, Y/N questions. Very agreeable to Po intake. Able to sustain attention to self feeding in a quiet environment, consumed 8 Oz of juice, a container of yogurt, a bowl of apple sauce and a container of chocolate pudding independently. Pt continues to hold Po only with total assist feeding. When feeding himself, oral phase is still slow with suspicion for occasional premature spillage or pharyngeal residuals, but no oral holding. Pt is clearly hungry and thirsty. Discussed benefit of self feeding with wife and staff.    HPI HPI: 71 y.o.malewith past medical history of ESRD on dialysis, dementia, HTN,recentfall with facial fractures in November, recent hospitalization for subdural hematoma, recent admission for encephalopathy, nowadmitted on1/3/2019with altered mental status and incidental finding on CT of cerebellar infarct.Now has failure to thrive. Hospice referral was recommended, family/POA have requested continued aggressive careand hospice plans were canceled. Placed on a regular diet by Palliative care team 1/7 with goals shifting towards comfort however now seems that family wishes to continue HD.  Note that the patient has had a decline since mid year last year (even before his SDH, he was declining, had seen Neurology in June and then October, noted to have dementia, starting to need help with dressing, ADLs). Pt was seen by SLP initially during this  admit, but pt was not progressing and Palliative care initiated regualr diet and thin liquids for comfort and discharged SLP orders. Pts wife has since verbalized that she would not want tp to eat/drink with known risk and that he is not capable of that diet. SLP was reordered.       SLP Plan  Continue with current plan of care       Recommendations  Diet recommendations: Nectar-thick liquid;Dysphagia 1 (puree) Liquids provided via: Cup Medication Administration: Crushed with puree Supervision: Staff to assist with self feeding;Full supervision/cueing for compensatory strategies Compensations: Minimize environmental distractions Postural Changes and/or Swallow Maneuvers: Seated upright 90 degrees                Oral Care Recommendations: Oral care BID Follow up Recommendations: Skilled Nursing facility SLP Visit Diagnosis: Dysphagia, oropharyngeal phase (R13.12) Plan: Continue with current plan of care       GO                Caty Tessler, Katherene Ponto 03/30/2017, 10:31 AM

## 2017-03-30 NOTE — Progress Notes (Signed)
Pt cbg 254 @ 11:55. MD notified

## 2017-03-30 NOTE — Progress Notes (Signed)
Pt off unit for HD
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
## 2017-03-31 ENCOUNTER — Inpatient Hospital Stay (HOSPITAL_COMMUNITY): Payer: Medicare Other

## 2017-03-31 LAB — RENAL FUNCTION PANEL
ALBUMIN: 2.6 g/dL — AB (ref 3.5–5.0)
Anion gap: 17 — ABNORMAL HIGH (ref 5–15)
BUN: 91 mg/dL — ABNORMAL HIGH (ref 6–20)
CALCIUM: 8.4 mg/dL — AB (ref 8.9–10.3)
CHLORIDE: 106 mmol/L (ref 101–111)
CO2: 21 mmol/L — ABNORMAL LOW (ref 22–32)
CREATININE: 11.98 mg/dL — AB (ref 0.61–1.24)
GFR calc Af Amer: 4 mL/min — ABNORMAL LOW (ref >60)
GFR calc non Af Amer: 4 mL/min — ABNORMAL LOW (ref >60)
GLUCOSE: 131 mg/dL — AB (ref 65–99)
Phosphorus: 7 mg/dL — ABNORMAL HIGH (ref 2.5–4.6)
Potassium: 5.3 mmol/L — ABNORMAL HIGH (ref 3.5–5.1)
Sodium: 144 mmol/L (ref 135–145)

## 2017-03-31 LAB — CBC
HCT: 37.5 % — ABNORMAL LOW (ref 39.0–52.0)
Hemoglobin: 12.6 g/dL — ABNORMAL LOW (ref 13.0–17.0)
MCH: 31.1 pg (ref 26.0–34.0)
MCHC: 33.6 g/dL (ref 30.0–36.0)
MCV: 92.6 fL (ref 78.0–100.0)
PLATELETS: 253 10*3/uL (ref 150–400)
RBC: 4.05 MIL/uL — ABNORMAL LOW (ref 4.22–5.81)
RDW: 16.6 % — ABNORMAL HIGH (ref 11.5–15.5)
WBC: 6.2 10*3/uL (ref 4.0–10.5)

## 2017-03-31 LAB — VITAMIN B1: Vitamin B1 (Thiamine): 182.4 nmol/L (ref 66.5–200.0)

## 2017-03-31 LAB — GLUCOSE, CAPILLARY
GLUCOSE-CAPILLARY: 142 mg/dL — AB (ref 65–99)
Glucose-Capillary: 118 mg/dL — ABNORMAL HIGH (ref 65–99)
Glucose-Capillary: 129 mg/dL — ABNORMAL HIGH (ref 65–99)
Glucose-Capillary: 126 mg/dL — ABNORMAL HIGH (ref 65–99)

## 2017-03-31 LAB — PROTIME-INR
INR: 1.05
PROTHROMBIN TIME: 13.6 s (ref 11.4–15.2)

## 2017-03-31 MED ORDER — GLUCAGON HCL RDNA (DIAGNOSTIC) 1 MG IJ SOLR
INTRAMUSCULAR | Status: AC
  Filled 2017-03-31: qty 1

## 2017-03-31 MED ORDER — MIDAZOLAM HCL 2 MG/2ML IJ SOLN
INTRAMUSCULAR | Status: AC
  Filled 2017-03-31: qty 6

## 2017-03-31 MED ORDER — ALPRAZOLAM 0.25 MG PO TABS
0.2500 mg | ORAL_TABLET | ORAL | Status: DC | PRN
  Administered 2017-04-01: 0.25 mg via ORAL
  Filled 2017-03-31: qty 1

## 2017-03-31 MED ORDER — THIAMINE HCL 100 MG/ML IJ SOLN
500.0000 mg | Freq: Three times a day (TID) | INTRAVENOUS | Status: DC
  Administered 2017-03-31 – 2017-04-02 (×6): 500 mg via INTRAVENOUS
  Filled 2017-03-31 (×9): qty 5

## 2017-03-31 MED ORDER — FENTANYL CITRATE (PF) 100 MCG/2ML IJ SOLN
INTRAMUSCULAR | Status: AC
  Filled 2017-03-31: qty 2

## 2017-03-31 MED ORDER — LIDOCAINE-PRILOCAINE 2.5-2.5 % EX CREA
TOPICAL_CREAM | Freq: Every day | CUTANEOUS | Status: DC | PRN
  Filled 2017-03-31: qty 5

## 2017-03-31 MED ORDER — QUETIAPINE FUMARATE 25 MG PO TABS
25.0000 mg | ORAL_TABLET | Freq: Three times a day (TID) | ORAL | Status: DC
  Administered 2017-03-31 – 2017-04-02 (×4): 25 mg via ORAL
  Filled 2017-03-31 (×4): qty 1

## 2017-03-31 NOTE — Progress Notes (Signed)
Patient ID: [name], male   DOB: Sep 19, 1945, 72 y.o.   MRN: [id number] Patient was scheduled for percutaneous gastrostomy tube placement.  Patient is confused and combative.  We will not be able to perform a gastrostomy when patient is in this state.  Discussed with Dr. Hollie Salk of Nephrology and they will try dialysis again with sedation.  I am concerned that he is not a good gastrostomy tube candidate because I suspect he will pull the tube out.  Discussed with family.  Main concern is getting him dialysis tonight.  It is my understanding that he may not be an outpatient dialysis candidate if he remains this combative.  IR will follow but gastrostomy tube is on hold.

## 2017-03-31 NOTE — Care Management Note (Signed)
Case Management Note  Patient Details  Name: [name] MRN: [id number] Date of Birth: Oct 19, 1945  Subjective/Objective:                    Action/Plan: Pt with aggressive behavior during dialysis and during LTM EEG. Plan for regular EEG and premedication before dialysis. Pt to receive PEG tube placement today. Plan is for [facility] once patient gets PEG and is less aggressive during dialysis. CM following.  Expected Discharge Date:                  Expected Discharge Plan:  Long Term Acute Care (LTAC)  In-House Referral:     Discharge planning Services  CM Consult  Post Acute Care Choice:  Home Health, Resumption of Svcs/PTA Provider Choice offered to:  Patient  DME Arranged:    DME Agency:     HH Arranged:    [place] Agency:     Status of Service:  In process, will continue to follow  If discussed at Long Length of Stay Meetings, dates discussed:    Additional Comments:  [name], RN 03/31/2017, 1:33 PM

## 2017-03-31 NOTE — Progress Notes (Signed)
PT Cancellation Note  Patient Details Name: TAIWAN TALCOTT MRN: [id number] DOB: 06-Sep-1945   Cancelled Treatment:    Reason Eval/Treat Not Completed: Patient at procedure or test/unavailable for PEG placement. Will follow-up for PT re-evaluation.  Mabeline Caras, PT, DPT Acute Rehab Services  Pager: [place] 03/31/2017, 4:46 PM

## 2017-03-31 NOTE — Progress Notes (Signed)
Pt refusing - will try again as schedule permits.

## 2017-03-31 NOTE — Progress Notes (Signed)
Patient returned from IR at this time, RN received patient and check PEG site but no PEG. RN called IR to find out why. Per IR RN, she verbalized that pt was combative and needed general anes. RN notified attending MD.   Ave Filter, RN

## 2017-03-31 NOTE — Progress Notes (Signed)
Per patient spouse, she wants pt not to have Depacon because of the side effects. RN acknowledged requests at this time, Oncoming RN aware of request. Family will discuss this medication with MD in the AM.   Ave Filter, RN

## 2017-03-31 NOTE — Progress Notes (Signed)
Patient left unit for IR (PEG placement at this time).  Ave Filter, RN

## 2017-03-31 NOTE — Progress Notes (Addendum)
Received report from Day shift RN that pt is to have hemodialysis tonight.  This RN called HD department at 2230  To know when pt will be pick up so that pt will be given xanax prior to HD.  No one answered the Phone. A repeat call to HD at 2300 and was told by HD RN TORI that pt will not be done tonight.since she is the only RN in the department ,so pt wlll be dialyzed tomorrow .Will notify MD.  .

## 2017-03-31 NOTE — Progress Notes (Signed)
Patient noted combative with ADLs and declines care. Family at bedside at this time and aware. Family noted with verbal encouragement toward pt.

## 2017-03-31 NOTE — Progress Notes (Signed)
  [name] Progress Note   Assessment/ Plan:   1. ESRD MWF [facility]- unable to dialyze yesterday 1/14 due to aggressive behaviors.  I had a long talk with pt's sister and son at bedside.  It is their wish to try everything possible to safely dialyze.  Son even asked about restraining pts in dialysis which we do not do.  Will try to apply EMLA cream and possibly give a very small dose of Xanax prior to HD (0.25 PO prn prior to dialysis).  If pt can take the seroquel prescribed by neurology, that may help matters as well.  At present, if we cannot alleviate these behaviors which make dialysis dangerous to the pt and the staff he will no longer be a candidate for an outpatient setting.   [place]- neurology following.  To have EEG. 3. AMS with Dementia- with behavioral disturbances.  Seroquel as above 4. Progressive decline/FTT--> PEG requested with IR and this is supposed to happen today.  On Marinol 5. Anemia- Hgb 10.9, no ESA 6. BMD: no real intake, no binders or calcitriol 7. Nutrition: as above in #4 8. Dispo: pending   Subjective:    Was not able to dialyze yesterday due to aggressive behaviors in dialysis.  Unable to be cannulated.  This morning, tries to hit NT who is checking blood sugar.     Objective:   BP (!) 163/88 (BP Location: Right Wrist)   Pulse 94   Temp 98.7 F (37.1 C) (Axillary)   Resp 20   Ht 5\' 7"  (1.702 m)   Wt 51 kg (112 lb 7 oz)   SpO2 97%   BMI 17.61 kg/m   Physical Exam: Gen: older gentleman, sitting in bed, NAD when not stimulated but swats at nursing staff when interventions performed CVS: RRR  Resp: unlabored and clear Abd: nontender nondistended Ext: no LE edema ACCESS: L RC fistula + T/B  Labs: BMET Recent Labs  Lab 03/25/17 0720 03/27/17 0745  NA 141 141  K 4.3 4.3  CL 101 103  CO2 23 20*  GLUCOSE 152* 118*  BUN 68* 71*  CREATININE 10.93* 10.17*  CALCIUM 8.7* 8.5*  PHOS 6.9* 7.6*    CBC Recent Labs  Lab 03/25/17 0720 03/27/17 0745 03/31/17 0851  WBC 13.9* 8.1 6.2  HGB 11.1* 10.9* 12.6*  HCT 33.9* 33.4* 37.5*  MCV 94.4 94.4 92.6  PLT 211 167 253    @IMGRELPRIORS @ Medications:    . aspirin  300 mg Rectal Daily   Or  . aspirin  325 mg Oral Daily  . atorvastatin  80 mg Oral q1800  . carvedilol  3.125 mg Oral BID WC  . dronabinol  2.5 mg Oral QAC lunch  . feeding supplement (ENSURE ENLIVE)  237 mL Oral BID BM  . insulin aspart  0-9 Units Subcutaneous TID WC  . QUEtiapine  25 mg Oral TID     Madelon Lips MD [facility] pgr [phone] 03/31/2017, 1:17 PM

## 2017-03-31 NOTE — Progress Notes (Addendum)
The patient refused placement of leads for LTM EEG. Likelihood of achieving a full 24 hour recording is also diminished in the context of patient's belligerent refusal to be examined or have leads placed. I am somewhat concerned for the safety of the EEG tech.   A/R: 72 year old male with recent traumatic right posterior fossa subdural hematoma (resolved on repeat CT from 1/3), vascular dementia, new 1 cm left cerebellar ischemic infarction, failure to thrive and waxing/waning mental status with aggressive behavior.  1. Encephalopathy is most likely multifactorial.  2. There is a possibility of subclinical seizures, but felt to be relatively unlikely. Initial EEG on 1/10 showed FIRDA and generalized irregular slow activity. Of note, there was no seizure or seizure predisposition recorded on the study. 3. Due to patient noncompliance, LTM EEG order has been changed to a repeat spot EEG, which the patient may be able to tolerate. Discussed with EEG tech, who will attempt to place leads on again tomorrow.  4. Starting Seroquel 25 mg po TID 5. Changing thiamine to 3-day high-dose protocol: 500 mg IV TID x 3 days. Will need to switch back to 100 mg po qd following the 3-day high-dose protocol.   Electronically signed: Dr. Kerney Elbe

## 2017-03-31 NOTE — Progress Notes (Addendum)
PROGRESS NOTE    [name]  KWI:[id number] DOB: 1945/05/18 DOA: 03/19/2017 PCP: Mackie Pai, PA-C    Brief Narrative:   72 yo M with ESRD on MWF HD, HTN, DM prior not on meds, HTN, CHr Sys HF. CAD on med management-prior SDH 04/12/16-extensive Orbital #'s, Small L SDH and recent traumatic subdural hematoma, not evacuated who presents with stroke.  Now has failure to thrive.  Hospice referral was recommended, family/POA have requested continued aggressive care and hospice plans were canceled. Neurology on board. Workup ongoing (LTM EEG pending). Ongoing waxing and waning mental status changes. Aggressive at HD on 1/14. Dialysis stopped prematurely. Family consented to PEG tube placement by IR 1/15. Disposition unclear:? LTAC   Assessment & Plan:  Principal Problem:   Acute CVA (cerebrovascular accident) ([place]) Active Problems:   Anemia of renal disease   ESRD (end stage renal disease) ([place])   Acute encephalopathy   DM (diabetes mellitus), type 2 with renal complications ([name])   Advance care planning   Goals of care, counseling/discussion   Palliative care by specialist   Leukocytosis   Tobacco abuse   History of prostate cancer   Benign prostatic hyperplasia   Protein-calorie malnutrition, severe    Encephalopathy Delirium Failure to thrive See Dr. [name] note from 1/6.  Per Neurology--abrupt decline over the past week pta and he feels that there may be some chance of recovery to the level of function end of last year. ? LTAC if patient/family wished to pursue aggressive care. MRI: Stable subacute 10 mm infarct left cerebellum, no other acute infarct, multiple remote lacunar infarcts of the basal ganglia and corona radiata, multiple foci of chronic microhemorrhages/stable and advanced atrophy and white matter disease . EEG: Encephalopathic and no seizure activity. Supplementing thiamine. - Patient has been more alert but still exhibits waxing and waning behavior. He has been  aggressive with multiple medical staff at times. Neurology plans LTM EEG to assess for subclinical seizures and have restarted him on Depakote which had been previously recommended. - Family have decided to proceed with PEG tube placement by IR scheduled for 1/15.-could not be done, Need anesthesia -Have asked psychiatry to see -Have been realistic with family that if we do not find a source and that if he doesn't comply, getting scheudled ativan with dialysis is not a good option and this may need ot be withdrawn -chaplain to get HCPOA paperwork sorted out  Acute Stroke -Continue daily aspirin, statin, Coreg if he takes them. Speech therapy recommended dysphagia 1 diet and nectar thickened liquids. Encourage oral intake if able - As discussed with neurology, small stroke unlikely to have caused his abrupt decline in mental status. Repeat MRI brain 1/12 without new or acute findings. -PEG tube on hold  Tachycardia Leukocytosis There are no localizing signs of infection.  ESRD -Consult to Nephrology for routine HD.  - Patient will need LTAC because he may not be able to sit for outpatient dialysis. - Patient has demonstrated aggressive behavior with the Nephrologist over the weekend and at HD today.  -further discussions as per  Diabetes - Mildly uncontrolled CBGs. cbg 118-131  Anemia of renal disease Stable.  Other medications -May continue venlafaxine and ranitidine and dronabinol if willing/able to take    DVT prophylaxis: SCDs Code Status: DO NOT RESUSCITATE Family Communication: D/w Son to see what the issues Disposition Plan: ? PEG  Consultants:   Neurology  Nephrology  Palliative Care  IR  Procedures:   MRI brain  MRA head  Carotid US  Echocardiogram     Subjective:  combative and not allowing exam Cannot do further  Objective: Vitals:   03/30/17 1821 03/30/17 2100 03/31/17 0100 03/31/17 0457  BP: (!) 182/64 136/80 135/73 (!) 148/73  Pulse:  92 87 85 88  Resp: 20 18 18 18   Temp:  (!) 97.5 F (36.4 C) (!) 97.5 F (36.4 C) 97.6 F (36.4 C)  TempSrc:  Oral Oral Axillary  SpO2: 98% 99% 100% 100%  Weight:      Height:        Intake/Output Summary (Last 24 hours) at 03/31/2017 0753 Last data filed at 03/30/2017 1800 Gross per 24 hour  Intake 695 ml  Output -  Net 695 ml   Filed Weights   03/27/17 0304 03/27/17 0741 03/27/17 1203  Weight: 56.8 kg (125 lb 3.5 oz) 52.1 kg (114 lb 13.8 oz) 51 kg (112 lb 7 oz)    Examination:   cannot examine-combative  Data Reviewed: I have personally reviewed following labs and imaging studies:  CBC: Recent Labs  Lab 03/25/17 0720 03/27/17 0745  WBC 13.9* 8.1  HGB 11.1* 10.9*  HCT 33.9* 33.4*  MCV 94.4 94.4  PLT 211 016   Basic Metabolic Panel: Recent Labs  Lab 03/25/17 0720 03/27/17 0745  NA 141 141  K 4.3 4.3  CL 101 103  CO2 23 20*  GLUCOSE 152* 118*  BUN 68* 71*  CREATININE 10.93* 10.17*  CALCIUM 8.7* 8.5*  PHOS 6.9* 7.6*   GFR: Estimated Creatinine Clearance: 4.9 mL/min (A) (by C-G formula based on SCr of 10.17 mg/dL (H)). Liver Function Tests: Recent Labs  Lab 03/25/17 0720 03/27/17 0745  ALBUMIN 2.9* 2.8*   CBG: Recent Labs  Lab 03/29/17 2116 03/30/17 0711 03/30/17 1149 03/30/17 2110 03/31/17 0630  GLUCAP 177* 162* 254* 217* 118*    Recent Results (from the past 240 hour(s))  Culture, blood (routine x 2)     Status: None   Collection Time: 03/22/17  2:15 PM  Result Value Ref Range Status   Specimen Description BLOOD RIGHT HAND  Final   Special Requests   Final    BOTTLES DRAWN AEROBIC ONLY Blood Culture results may not be optimal due to an inadequate volume of blood received in culture bottles   Culture NO GROWTH 5 DAYS  Final   Report Status 03/27/2017 FINAL  Final  Culture, blood (routine x 2)     Status: None   Collection Time: 03/22/17  2:20 PM  Result Value Ref Range Status   Specimen Description BLOOD RIGHT HAND  Final   Special  Requests   Final    BOTTLES DRAWN AEROBIC ONLY Blood Culture adequate volume   Culture NO GROWTH 5 DAYS  Final   Report Status 03/27/2017 FINAL  Final    Radiology Studies: No results found.  Scheduled Meds: . aspirin  300 mg Rectal Daily   Or  . aspirin  325 mg Oral Daily  . atorvastatin  80 mg Oral q1800  . carvedilol  3.125 mg Oral BID WC  . dronabinol  2.5 mg Oral QAC lunch  . feeding supplement (ENSURE ENLIVE)  237 mL Oral BID BM  . insulin aspart  0-9 Units Subcutaneous TID WC  . thiamine  100 mg Oral Daily   Continuous Infusions: . sodium chloride 50 mL/hr at 03/28/17 1233  . valproate sodium Stopped (03/31/17 0420)     LOS: 12 days    Time spent: 25 minutes  Verneita Griffes, MD [phone]   If 7PM-7AM, please contact night-coverage [email or web address] Password [facility] 03/31/2017, 7:53 AM

## 2017-03-31 NOTE — Progress Notes (Signed)
Pt's wife wants Depacon d/c'd because she thinks it causes his agitation/aggression. Will hold one dose tonight and RN told wife to discuss this with Dr. in am. Baltazar Najjar, NP

## 2017-04-01 ENCOUNTER — Inpatient Hospital Stay (HOSPITAL_COMMUNITY): Payer: Medicare Other

## 2017-04-01 DIAGNOSIS — R4587 Impulsiveness: Secondary | ICD-10-CM

## 2017-04-01 DIAGNOSIS — R627 Adult failure to thrive: Secondary | ICD-10-CM

## 2017-04-01 DIAGNOSIS — Z008 Encounter for other general examination: Secondary | ICD-10-CM

## 2017-04-01 DIAGNOSIS — F17210 Nicotine dependence, cigarettes, uncomplicated: Secondary | ICD-10-CM

## 2017-04-01 DIAGNOSIS — R410 Disorientation, unspecified: Secondary | ICD-10-CM

## 2017-04-01 DIAGNOSIS — E43 Unspecified severe protein-calorie malnutrition: Secondary | ICD-10-CM

## 2017-04-01 LAB — RENAL FUNCTION PANEL
Albumin: 2.6 g/dL — ABNORMAL LOW (ref 3.5–5.0)
Anion gap: 16 — ABNORMAL HIGH (ref 5–15)
BUN: 99 mg/dL — ABNORMAL HIGH (ref 6–20)
CALCIUM: 8 mg/dL — AB (ref 8.9–10.3)
CHLORIDE: 110 mmol/L (ref 101–111)
CO2: 18 mmol/L — AB (ref 22–32)
CREATININE: 12.74 mg/dL — AB (ref 0.61–1.24)
GFR calc Af Amer: 4 mL/min — ABNORMAL LOW (ref >60)
GFR calc non Af Amer: 3 mL/min — ABNORMAL LOW (ref >60)
GLUCOSE: 99 mg/dL (ref 65–99)
Phosphorus: 7.4 mg/dL — ABNORMAL HIGH (ref 2.5–4.6)
Potassium: 4.5 mmol/L (ref 3.5–5.1)
SODIUM: 144 mmol/L (ref 135–145)

## 2017-04-01 LAB — CBC
HCT: 34.7 % — ABNORMAL LOW (ref 39.0–52.0)
HEMOGLOBIN: 11.5 g/dL — AB (ref 13.0–17.0)
MCH: 30.5 pg (ref 26.0–34.0)
MCHC: 33.1 g/dL (ref 30.0–36.0)
MCV: 92 fL (ref 78.0–100.0)
Platelets: 276 10*3/uL (ref 150–400)
RBC: 3.77 MIL/uL — AB (ref 4.22–5.81)
RDW: 16.9 % — ABNORMAL HIGH (ref 11.5–15.5)
WBC: 5.8 10*3/uL (ref 4.0–10.5)

## 2017-04-01 LAB — GLUCOSE, CAPILLARY
GLUCOSE-CAPILLARY: 88 mg/dL (ref 65–99)
GLUCOSE-CAPILLARY: 105 mg/dL — AB (ref 65–99)
GLUCOSE-CAPILLARY: 221 mg/dL — AB (ref 65–99)
Glucose-Capillary: 90 mg/dL (ref 65–99)
Glucose-Capillary: 194 mg/dL — ABNORMAL HIGH (ref 65–99)

## 2017-04-01 MED ORDER — LIDOCAINE-PRILOCAINE 2.5-2.5 % EX CREA: 1.0000 "application " | TOPICAL_CREAM | CUTANEOUS | Status: DC | PRN

## 2017-04-01 MED ORDER — HEPARIN SODIUM (PORCINE) 1000 UNIT/ML DIALYSIS: 1000.0000 [IU] | INTRAMUSCULAR | Status: DC | PRN

## 2017-04-01 MED ORDER — SODIUM CHLORIDE 0.9 % IV SOLN
100.0000 mL | INTRAVENOUS | Status: DC | PRN
Start: 2017-04-01 — End: 2017-04-01

## 2017-04-01 MED ORDER — ALTEPLASE 2 MG IJ SOLR: 2.0000 mg | Freq: Once | INTRAMUSCULAR | Status: DC | PRN

## 2017-04-01 MED ORDER — PENTAFLUOROPROP-TETRAFLUOROETH EX AERO
1.0000 | INHALATION_SPRAY | CUTANEOUS | Status: DC | PRN
Start: 2017-04-01 — End: 2017-04-01

## 2017-04-01 MED ORDER — LIDOCAINE HCL (PF) 1 % IJ SOLN: 5.0000 mL | INTRAMUSCULAR | Status: DC | PRN

## 2017-04-01 MED ORDER — VENLAFAXINE HCL ER 37.5 MG PO CP24
37.5000 mg | ORAL_CAPSULE | Freq: Every day | ORAL | Status: DC
  Administered 2017-04-02: 37.5 mg via ORAL
  Filled 2017-04-01: qty 1

## 2017-04-01 NOTE — Procedures (Signed)
Patient seen and examined on Hemodialysis. QB 400 mL/ min UF goal 2L.  Is calm when not stimulated.  Treatment adjusted as needed.  Madelon Lips MD [facility] pgr [phone] 1:58 PM

## 2017-04-01 NOTE — Progress Notes (Signed)
EEG completed; results pending. Very difficult hookup, pt very agitated and uncooperative

## 2017-04-01 NOTE — Progress Notes (Signed)
  [facility] Progress Note   Assessment/ Plan:    1. ESRD MWF [facility]- unable to dialyze 1/14 due to aggressive behaviors.  I had a long talk with pt's sister and son at bedside 1/15.  It is their wish to try everything possible to safely dialyze.  Son even asked about restraining pts in dialysis which we do not do.  Will try to apply EMLA cream and possibly give a very small dose of Xanax prior to HD (0.25 PO prn prior to dialysis).  If pt can take the seroquel prescribed by neurology, that may help matters as well.  At present, if we cannot alleviate these behaviors which make dialysis dangerous to the pt and the staff he will no longer be a candidate for an outpatient setting.  It is not feasible to require 3 people to cannulate pt in the outpatient setting either although this was successful today.  [place]- neurology following.  To have EEG.  3. Dementia with behavioral disturbances:  Neurology and psychiatry following Seroquel and depakote prescribed  4. Progressive decline/FTT--> PEG requested with IR, not able to be performed due to combativeness.  Holding off for a couple of days. On Marinol  5. Anemia- Hgb 10.9, no ESA  6. BMD: no real intake, no binders or calcitriol  7. Nutrition: as above in #4  8. Dispo: pending.  Subjective:    Did not get PEG yesterday due to concerns over combativeness.  Today it took 3 people to cannulate pt- two to hold down his arm and one to cannulate.     Objective:   BP 101/60 (BP Location: Right Arm)   Pulse 99   Temp 98 F (36.7 C) (Oral)   Resp 16   Ht 5\' 7"  (1.702 m)   Wt 54 kg (119 lb 0.8 oz)   SpO2 100%   BMI 18.65 kg/m   Physical Exam: Gen: older gentleman, sitting in bed, NAD. Cachectic.  Regards examiner but doesn't really interact CVS: RRR  Resp: unlabored and clear Abd: nontender nondistended Ext: no LE edema ACCESS: L RC fistula + T/B  Labs: BMET Recent Labs  Lab  03/27/17 0745 03/31/17 1510 04/01/17 0857  NA 141 144 144  K 4.3 5.3* 4.5  CL 103 106 110  CO2 20* 21* 18*  GLUCOSE 118* 131* 99  BUN 71* 91* 99*  CREATININE 10.17* 11.98* 12.74*  CALCIUM 8.5* 8.4* 8.0*  PHOS 7.6* 7.0* 7.4*   CBC Recent Labs  Lab 03/27/17 0745 03/31/17 0851 04/01/17 0858  WBC 8.1 6.2 5.8  HGB 10.9* 12.6* 11.5*  HCT 33.4* 37.5* 34.7*  MCV 94.4 92.6 92.0  PLT 167 253 276    @IMGRELPRIORS @ Medications:    . aspirin  300 mg Rectal Daily   Or  . aspirin  325 mg Oral Daily  . atorvastatin  80 mg Oral q1800  . carvedilol  3.125 mg Oral BID WC  . dronabinol  2.5 mg Oral QAC lunch  . feeding supplement (ENSURE ENLIVE)  237 mL Oral BID BM  . insulin aspart  0-9 Units Subcutaneous TID WC  . QUEtiapine  25 mg Oral TID     Madelon Lips MD [facility] pgr [phone] 04/01/2017, 1:50 PM

## 2017-04-01 NOTE — Progress Notes (Signed)
Patient ID: [name], male   DOB: [birth year], 72 y.o.   MRN: [id number]    Referring Physician(s): Dr. Vernell Leep  Supervising Physician: Sandi Mariscal  Patient Status: [facility] - In-pt  Chief Complaint: AMS, dysphagia  Subjective: Patient was too combative to perform g-tube placement yesterday.  Too combative for HD, today is first day getting HD in about a week.  Still AMS.  Does not answer questions or speak really.  Allergies: Patient has no known allergies.  Medications: Prior to Admission medications   Medication Sig Start Date End Date Taking? Authorizing Provider  ARTIFICIAL TEAR SOLUTION OP Instill 1-2 drops into both eyes two to three times a day as needed for dry eyes   Yes [provider]  atorvastatin (LIPITOR) 80 MG tablet Take 80 mg by mouth at bedtime.   Yes [provider]  carvedilol (COREG) 3.125 MG tablet Take 3.125 mg by mouth 2 (two) times daily with a meal.   Yes [provider]  dronabinol (MARINOL) 2.5 MG capsule Take 2.5 mg by mouth once a day 03/06/17  Yes [provider]  multivitamin (RENA-VIT) TABS tablet Take 1 tablet by mouth every Monday, Wednesday, and Friday.    Yes [provider]  ranitidine (ZANTAC) 150 MG capsule Take 1 capsule (150 mg total) by mouth 2 (two) times daily. 01/06/17  Yes Saguier, Percell Miller, PA-C  venlafaxine (EFFEXOR) 37.5 MG tablet Take 37.5 mg by mouth every morning.   Yes [provider]    Vital Signs: BP 126/75   Pulse (!) 107   Temp 98 F (36.7 C) (Oral)   Resp 15   Ht 5\' 7"  (1.702 m)   Wt 123 lb 14.4 oz (56.2 kg) Comment: Bed Scale  SpO2 100%   BMI 19.41 kg/m   Physical Exam: Abd: soft, Nt, Nd, +BS  Imaging: No results found.  Labs:  CBC: Recent Labs    03/25/17 0720 03/27/17 0745 03/31/17 0851 04/01/17 0858  WBC 13.9* 8.1 6.2 5.8  HGB 11.1* 10.9* 12.6* 11.5*  HCT 33.9* 33.4* 37.5* 34.7*  PLT 211 167 253 276    COAGS: Recent Labs     06/23/16 1638 10/21/16 2013 12/23/16 0336 03/10/17 1506 03/19/17 1759 03/31/17 0851  INR 1.08 1.07 0.99 1.05 1.09 1.05  APTT 57* 55*  --  55* 35  --     BMP: Recent Labs    03/25/17 0720 03/27/17 0745 03/31/17 1510 04/01/17 0857  NA 141 141 144 144  K 4.3 4.3 5.3* 4.5  CL 101 103 106 110  CO2 23 20* 21* 18*  GLUCOSE 152* 118* 131* 99  BUN 68* 71* 91* 99*  CALCIUM 8.7* 8.5* 8.4* 8.0*  CREATININE 10.93* 10.17* 11.98* 12.74*  GFRNONAA 4* 4* 4* 3*  GFRAA 5* 5* 4* 4*    LIVER FUNCTION TESTS: Recent Labs    03/11/17 0631 03/19/17 1759 03/20/17 0246 03/22/17 1054 03/25/17 0720 03/27/17 0745 03/31/17 1510 04/01/17 0857  BILITOT 0.8 0.6 0.8 1.2  --   --   --   --   AST 11* 12* 7* 12*  --   --   --   --   ALT 10* 10* 8* 8*  --   --   --   --   ALKPHOS 97 90 89 97  --   --   --   --   PROT 7.3 7.8 7.4 8.5*  --   --   --   --  ALBUMIN 3.4* 3.7 3.7 3.8 2.9* 2.8* 2.6* 2.6*    Assessment and Plan: 1. CVA, AMS 2. PCM, mild dysphagia  The patient was scheduled for g-tube placement yesterday at the family's request as they feel this may help him improve and get better.  Patient was too combative yesterday to proceed with g-tube placement.  This was discussed with the family as well as the concern that if the patient continues to be combative at time and pulling at things, he could pull out his g-tube.  This is clearly a concern given the risk for infection or gastric leakage into the abdominal cavity if that were to happen.  The son did ask if we could hold on g-tube placement for several days since he had eaten well on Monday.  We are in full agreement with this as we feel that a g-tube is not likely the best option for this patient in the first place.  We will follow and see what happens.  Electronically Signed: Henreitta Cea 04/01/2017, 10:10 AM   I spent a total of 15 Minutes at the the patient's bedside AND on the patient's hospital floor or unit, greater than 50% of  which was counseling/coordinating care for pcm, dysphagia

## 2017-04-01 NOTE — Progress Notes (Signed)
Subjective: Currently in dialysis.  When attempting to do exam he would not allow me to touch or have him follow commands. Repeated "get the hell off me"  Exam: Vitals:   04/01/17 1230 04/01/17 1240  BP: (!) 89/57 101/60  Pulse: (!) 115 99  Resp: 16 16  Temp:  98 F (36.7 C)  SpO2: 100% 100%    HEENT-  Normocephalic, no lesions, without obvious abnormality.  Normal external eye and conjunctiva.  Normal TM's bilaterally.  Normal auditory canals and external ears. Normal external nose, mucus membranes and septum.  Normal pharynx. Cardiovascular- S1, S2 normal, pulses palpable throughout   Lungs- chest clear, no wheezing, rales, normal symmetric air entry, Heart exam - S1, S2 normal, no murmur, no gallop, rate regular Abdomen- normal findings: bowel sounds normal   Neuro:  CN: Pupils are equal and round. They are symmetrically reactive from 3-->2 mm. EOMI without nystagmus. Facial sensation is intact to light touch. Face is symmetric at rest with normal strength and mobility. Hearing is intact to conversational voice.  Voice is dysarthric  Motor: UE 5/5 but could not assess LE due to  agressiveness   Medications:  Scheduled: . aspirin  300 mg Rectal Daily   Or  . aspirin  325 mg Oral Daily  . atorvastatin  80 mg Oral q1800  . carvedilol  3.125 mg Oral BID WC  . dronabinol  2.5 mg Oral QAC lunch  . feeding supplement (ENSURE ENLIVE)  237 mL Oral BID BM  . insulin aspart  0-9 Units Subcutaneous TID WC  . QUEtiapine  25 mg Oral TID    Pertinent Labs/Diagnostics: EEG pending   Etta Quill PA-C [facility] [phone]  Impression:   72 year old male with presenting with acute altered mental status in the setting of vascular dementia. The cerebellar infarct is not adequate to explain his overall confusion. He has had progressive failure to thrive, but the abrupt change over the past week does suggests there may be some chance of recovery to the level of function that  he was experiencing in between his December and January hospitalizations. Also, he has had significant weight loss, one consideration could be thiamine deficiency as a contributor.  1. Most likely this is primarily a multifactorial delirium in the setting of failure to thrive and underlying dementia. If family wishes to pursue G-tube, it is possible that he could have slow continued improvement towards baseline. This is not definite, however, and Dr. [name] has discussed that with the son. 2. With report of waxing and waning and previous concern for seizures, an EEG would be reasonable.Also he was supposed to be on Depakote but had not gotten it filled at the time of readmission.  Recommendations: 1. Agree with continued discussions with palliative care 2.EEG has been ordered and is pending 3. Continue Depakote 500 mg BID.   Electronically signed: Dr. Kerney Elbe 04/01/2017, 1:02 PM

## 2017-04-01 NOTE — Progress Notes (Signed)
Pt in HD, EEG can not be done at this time. Will attempt as schedule permits this afternoon.

## 2017-04-01 NOTE — Progress Notes (Signed)
PT Cancellation Note  Patient Details Name: [name] MRN: [id number] DOB: 1946/03/09   Cancelled Treatment:    Reason Eval/Treat Not Completed: Patient at procedure or test/unavailable.  Pt has been in HD most of the day and is now back to his room, however, there is a care provider talking with him and his family.  PT will check back later today or tomorrow as time allows.   Thanks,    Barbarann Ehlers. [place], [place], DPT [phone]   04/01/2017, 4:17 PM

## 2017-04-01 NOTE — Procedures (Signed)
  ELECTROENCEPHALOGRAM REPORT  Date of Study: 04/01/17  Patient's Name: [name] MRN: [id number] Date of Birth: 1946/02/28  Referring Provider:  Kerney Elbe, MD  Clinical History: [name] is a 72 y.o. male withpresenting withacute altered mental status in the setting of vascular dementia.The cerebellar infarct is not adequate to explain his overall confusion. He has had progressive failure to thrive, but the abrupt change over the past week does suggeststhere may be some chance of recovery to the level of function that he was experiencing in between his December and January hospitalizations. Head MRI (1/19) with 10 mm Lt cerebellar subacute infarct, multiple foci of chronic microhemorrhage, and advanced atrophy/ WM changes.   Medications: Scheduled Meds: . aspirin  300 mg Rectal Daily   Or  . aspirin  325 mg Oral Daily  . atorvastatin  80 mg Oral q1800  . carvedilol  3.125 mg Oral BID WC  . feeding supplement (ENSURE ENLIVE)  237 mL Oral BID BM  . insulin aspart  0-9 Units Subcutaneous TID WC  . QUEtiapine  25 mg Oral TID  . [START ON 04/02/2017] venlafaxine XR  37.5 mg Oral Q breakfast   Continuous Infusions: . sodium chloride 50 mL/hr at 03/31/17 2213  . thiamine injection 0 mg (03/31/17 1330)  . valproate sodium Stopped (03/31/17 1709)   PRN Meds:.acetaminophen **OR** acetaminophen, ALPRAZolam, antiseptic oral rinse, lidocaine-prilocaine, polyvinyl alcohol, RESOURCE THICKENUP CLEAR            Technical Summary: This is a standard 16 channel EEG recording performed according to the international 10-20 electrode system.  AP bipolar, transverse bipolar, and referential montages were obtained, and digitally reformatted as necessary.  Duration of tracing:  21:30  Description: The patient is described as being very agitated and uncooperative. The predominant feature of this tracing is diffuse slowing and disorganization that persists throughout the tracing.  A  5-6 Hz theta rhythm is the predominant posterior rhythm, and intermittent frontal delta (FIRDA) of 1-2 Hz frequency is noted.  No well defined drowsiness or sleep is appreciated.  Neither hyperventilation or photic stimulation were performed.  EKG was monitored and noted to be sinus tachycardia with an average heart rate of 108 bpm.  No epileptiform changes were noted.  Impression: This is an abnormal EEG due to diffuse background slowing and disorganization seen throughout the tracing.  This is a non-specific finding that can be seen with toxic, metabolic, diffuse, or multifocal structural processes.  No definite epileptiform changes were noted.   A single EEG without epileptiform changes does not exclude the diagnosis of epilepsy. Clinical correlation advised.   Carvel Getting, M.D. Neurology Cell [phone]

## 2017-04-01 NOTE — Progress Notes (Signed)
PROGRESS NOTE    ATWOOD ADCOCK  QMV:[id number] DOB: 11/13/1945 DOA: 03/19/2017 PCP: Mackie Pai, PA-C    Brief Narrative:   72 yo M with ESRD on MWF HD, HTN, DM prior not on meds, HTN, CHr Sys HF. CAD on med management-prior SDH 04/12/16-extensive Orbital #'s, Small L SDH and recent traumatic subdural hematoma, not evacuated who presents with stroke.  Now has failure to thrive.  Hospice referral was recommended, family/POA have requested continued aggressive care and hospice plans were canceled. Neurology on board. Workup ongoing (LTM EEG pending). Ongoing waxing and waning mental status changes. Aggressive at HD on 1/14. Dialysis stopped prematurely. Family consented to PEG tube placement by IR 1/15. Disposition unclear:? LTAC   Assessment & Plan:  Principal Problem:   Acute CVA (cerebrovascular accident) ([facility]) Active Problems:   Anemia of renal disease   ESRD (end stage renal disease) ([facility])   Acute encephalopathy   DM (diabetes mellitus), type 2 with renal complications ([name])   Advance care planning   Goals of care, counseling/discussion   Palliative care by specialist   Leukocytosis   Tobacco abuse   History of prostate cancer   Benign prostatic hyperplasia   Protein-calorie malnutrition, severe    Encephalopathy Delirium Failure to thrive See Dr. [name] note from 1/6.  Per Neurology--abrupt decline over the past week pta and he feels that there may be some chance of recovery to the level of function end of last year. ? LTAC if patient/family wished to pursue aggressive care. MRI: Stable subacute 10 mm infarct left cerebellum, no other acute infarct, multiple remote lacunar infarcts of the basal ganglia and corona radiata, multiple foci of chronic microhemorrhages/stable and advanced atrophy and white matter disease . EEG: Encephalopathic and no seizure activity. Supplementing thiamine. - Patient has been more alert but still exhibits waxing and waning behavior. He has been  aggressive with multiple medical staff at times. Neurology plans LTM EEG to assess for subclinical seizures--family requested to discontinue the depakote on 1/16 and see how he does mentally with regards to this - Family have decided to proceed with PEG tube placement by IR scheduled for 1/15-could not be done, Need anesthesia -Have asked psychiatry to see--input fromt hem is pendiong -Have been realistic with family that if we do not find a source and that if he doesn't comply, getting scheudled BZD with dialysis is not a good option and this may need to be withdrawn -chaplain to get HCPOA paperwork sorted out  Acute Stroke -Continue daily aspirin, statin, Coreg if he takes them. Speech therapy recommended dysphagia 1 diet and nectar thickened liquids. Encourage oral intake if able - As discussed with neurology, small stroke unlikely to have caused his abrupt decline in mental status. Repeat MRI brain 1/12 without new or acute findings. -PEG tube on hold for now and allow soft diet  Tachycardia Leukocytosis There are no localizing signs of infection.  ESRD -Consult to Nephrology for routine HD.  - Patient will need LTAC on d/c - Patient has demonstrated aggressive behavior with the Nephrologist over the weekend and at HD today.  -further discussions as per  Diabetes - Mildly uncontrolled CBGs. cbg 118-131  Anemia of renal disease Stable.  Other medications -May continue venlafaxine and ranitidine and dronabinol if willing/able to take    DVT prophylaxis: SCDs Code Status: DO NOT RESUSCITATE Family Communication: D/w Son to see what the issues Disposition Plan: ? PEG  Consultants:   Neurology  Nephrology  Palliative Care  IR  Procedures:   MRI brain  MRA head  Carotid US  Echocardiogram     Subjective:  combative and not allowing exam Cannot get anything out of him or have him verbalize-given xanax earlier today  Objective: Vitals:   04/01/17 1215  04/01/17 1230 04/01/17 1240 04/01/17 1400  BP: (!) 84/59 (!) 89/57 101/60 139/84  Pulse: (!) 117 (!) 115 99 (!) 102  Resp: 17 16 16 18   Temp:   98 F (36.7 C) 98.4 F (36.9 C)  TempSrc:   Oral Oral  SpO2:  100% 100% 100%  Weight:   54 kg (119 lb 0.8 oz)   Height:        Intake/Output Summary (Last 24 hours) at 04/01/2017 1459 Last data filed at 04/01/2017 1240 Gross per 24 hour  Intake 1145.83 ml  Output 2504 ml  Net -1358.17 ml   Filed Weights   03/31/17 2100 04/01/17 0830 04/01/17 1240  Weight: 56.2 kg (123 lb 14.4 oz) 56.2 kg (123 lb 14.4 oz) 54 kg (119 lb 0.8 oz)    Examination:   cannot examine-combative  Data Reviewed: I have personally reviewed following labs and imaging studies:  CBC: Recent Labs  Lab 03/27/17 0745 03/31/17 0851 04/01/17 0858  WBC 8.1 6.2 5.8  HGB 10.9* 12.6* 11.5*  HCT 33.4* 37.5* 34.7*  MCV 94.4 92.6 92.0  PLT 167 253 790   Basic Metabolic Panel: Recent Labs  Lab 03/27/17 0745 03/31/17 1510 04/01/17 0857  NA 141 144 144  K 4.3 5.3* 4.5  CL 103 106 110  CO2 20* 21* 18*  GLUCOSE 118* 131* 99  BUN 71* 91* 99*  CREATININE 10.17* 11.98* 12.74*  CALCIUM 8.5* 8.4* 8.0*  PHOS 7.6* 7.0* 7.4*   GFR: Estimated Creatinine Clearance: 4.1 mL/min (A) (by C-G formula based on SCr of 12.74 mg/dL (H)). Liver Function Tests: Recent Labs  Lab 03/27/17 0745 03/31/17 1510 04/01/17 0857  ALBUMIN 2.8* 2.6* 2.6*   CBG: Recent Labs  Lab 03/31/17 1727 03/31/17 2109 04/01/17 0610 04/01/17 1019 04/01/17 1412  GLUCAP 126* 142* 88 105* 90    No results found for this or any previous visit (from the past 240 hour(s)).  Radiology Studies: No results found.  Scheduled Meds: . aspirin  300 mg Rectal Daily   Or  . aspirin  325 mg Oral Daily  . atorvastatin  80 mg Oral q1800  . carvedilol  3.125 mg Oral BID WC  . dronabinol  2.5 mg Oral QAC lunch  . feeding supplement (ENSURE ENLIVE)  237 mL Oral BID BM  . insulin aspart  0-9 Units  Subcutaneous TID WC  . QUEtiapine  25 mg Oral TID   Continuous Infusions: . sodium chloride 50 mL/hr at 03/31/17 2213  . thiamine injection 0 mg (03/31/17 1330)  . valproate sodium Stopped (03/31/17 1709)     LOS: 13 days    Time spent: 15 minutes  Verneita Griffes, MD [facility]   If 7PM-7AM, please contact night-coverage [email or web address] Password [email or web address] 04/01/2017, 2:59 PM

## 2017-04-01 NOTE — Progress Notes (Signed)
   04/01/17 1400  Clinical Encounter Type  Visited With Patient;Patient and family together  Visit Type Follow-up  Referral From Nurse;Palliative care team  Consult/Referral To Chaplain  Spiritual Encounters  Spiritual Needs Other (Comment)  Stress Factors  Patient Stress Factors Health changes  Family Stress Factors Major life changes  Chaplain visited with the PT and family as nurse was in the room.  The PT family did not request an AD so the consult was cleared from the system.

## 2017-04-01 NOTE — Consult Note (Addendum)
[facility] Consult   Reason for Consult:  Agitation in setting of altered mental status  Referring Physician:  Dr. Verlon Au  Patient Identification: [name]:  [id number] Principal Diagnosis: Altered mental status Diagnosis:   Patient Active Problem List   Diagnosis Date Noted  . Protein-calorie malnutrition, severe [E43] 03/26/2017  . Leukocytosis [D72.829]   . Tobacco abuse [Z72.0]   . History of prostate cancer [Z85.46]   . Benign prostatic hyperplasia [N40.0]   . Advance care planning [Z71.89]   . Goals of care, counseling/discussion [Z71.89]   . Palliative care by specialist [Z51.5]   . Altered mental status [R41.82]   . Acute CVA (cerebrovascular accident) ([place]) [I63.9] 03/19/2017  . DM (diabetes mellitus), type 2 with renal complications ([place]) [U54.27] 03/19/2017  . Acute encephalopathy [G93.40] 03/10/2017  . Subdural hematoma ([place]) [S06.5X9A] 03/10/2017  . Dilated cardiomyopathy ([place]) [I42.0] 01/07/2017  . Acute respiratory failure with hypoxia ([place]) [J96.01] 12/22/2016  . Acute on chronic combined systolic and diastolic CHF (congestive heart failure) ([place]) [I50.43] 12/22/2016  . HLD (hyperlipidemia) [E78.5] 12/22/2016  . Elevated troponin I level [R74.8]   . Acute blood loss anemia [D62]   . Fall [W19.XXXA] 10/22/2016  . GIB (gastrointestinal bleeding) [K92.2] 10/21/2016  . Abnormal echocardiogram [R93.1] 08/06/2016  . Flash pulmonary edema ([place]) [J81.0] 07/06/2016  . SOB (shortness of breath) [R06.02] 07/06/2016  . Hypertensive urgency [I16.0] 07/06/2016  . Anemia in chronic kidney disease, on chronic dialysis ([name]) [N18.6, D63.1, Z99.2]   . Anemia due to chronic kidney disease [N18.9, D63.1]   . Diabetic retinopathy ([place]) [E11.319] 01/16/2016  . Dysphagia [R13.10] 12/21/2015  . Environmental allergies [Z91.09] 12/04/2015  . Allergic rhinitis [J30.9] 10/28/2015  . ESRD (end stage renal disease) ([place]) [N18.6] 10/28/2015  . Stroke ([place]) [I63.9]  04/06/2015  . AVM (arteriovenous malformation) of small bowel, acquired with hemorrhage ([place]) [K55.8] 04/06/2015  . Malnutrition of moderate degree [E44.0] 04/05/2015  . Acute renal failure superimposed on stage 4 chronic kidney disease ([place]) [N17.9, N18.4] 04/04/2015  . Symptomatic anemia [D64.9] 04/03/2015  . Benign paroxysmal positional vertigo [H81.10] 09/26/2014  . Weakness of right lower extremity [R29.898] 02/28/2014  . Metatarsalgia of both feet [M77.41, M77.42] 09/13/2013  . Equinus deformity of foot, acquired [M21.6X9] 09/13/2013  . Diabetes mellitus type 2, controlled, with complications ([place]) [C62.3] 04/27/2013  . GERD (gastroesophageal reflux disease) [K21.9] 03/24/2013  . GI bleed [K92.2] 02/28/2013  . Cerebral infarction ([place]) [I63.9] 11/17/2012  . Dizziness and giddiness [R42] 10/31/2012  . Prostate cancer ([place]) [C61] 10/27/2012  . Diabetic neuropathy ([place]) [E11.40] 10/04/2012  . CAD (coronary artery disease) [I25.10] 10/04/2012  . Hoarseness [R49.0] 06/26/2012  . Hematuria [R31.9] 05/24/2012  . Tinea pedis [B35.3] 05/24/2012  . Back pain [M54.9] 02/23/2012  . Erectile dysfunction [N52.9] 02/03/2012  . Type 2 diabetes, uncontrolled, with neuropathy ([place]) [E11.40, E11.65] 02/01/2012  . Essential hypertension [I10] 02/01/2012  . Tobacco use [Z72.0] 02/01/2012  . Hyperlipidemia [E78.5] 02/01/2012  . Anemia of renal disease [D63.1] 05/14/2011    Total Time spent with patient: 1 hour  Subjective:   [name] is a 72 y.o. male patient admitted with altered mental status and found to have acute stroke.   HPI:   Per chart review, patient has been intermittently agitated which has caused interruptions in his care. He was not able to receive dialysis due to agitation yesterday and is no longer a candidate for the outpatient setting. He is prescribed Seroquel 25 mg TID (started on 1/15).  He has failure to thrive over the past 6 months. His decline has acutely worsened since  SDH. Family would like PEG tube placement although there is concern that the patient would pull the tube out. He is taking Depakote 500 mg BID for seizure prophylaxis. It was held overnight due to family's concern that Depakote may be causing agitation.   [name] was only able to provide brief responses. He reports that he is doing okay. He denies SI or AVH by shaking his head no. He reports that he is hungry and denies somatic symptoms. His son was present at bedside. His wife was also on the phone. They provided his history. His wife reports that he has declined since the Sunday before Thanksgiving. He sustained a head injury due to falling while receiving dialysis. Per medical record, he was discharged to a SNF following hospitalization at [facility]. He had periods of agitation and combative behavior after he was discharged from rehab so he was hospitalized for further management at [facility] from 12/25-12/27.  He was diagnosed with metabolic encephalopathy. There was concern for possible seizures so he was started on Depakote. He was noted to calm down with medication management. Wife feels like the Depakote may be contributing to agitation. She reports that he became altered yesterday evening after he received Depakote. He was previously engaging in conversation with his family and was appropriate in behavior. He also reported abdominal pain and felt clammy after receiving Depakote. The family still feel that he has significantly improved since Monday. He is talking more and has not been as confused. His appetite has improved. Wife reports that he was taking Effexor prior to this hospitalization. It was started 3 months ago. It is helpful for mood swings. He has no prior history of aggressive behaviors or AVH. He is sleeping well. Wife also reports there is no prior history of dementia. She reports that he was independent of his ADLs until his fall in November. He now requires full assistance with his ADLs  and uses a walker to ambulate.   Past Psychiatric History: Denies   Risk to Self: Is patient at risk for suicide?: No Risk to Others:  Patient does not endorse HI but does have periods of aggressive behaviors. Prior Inpatient Therapy:  Denies  Prior Outpatient Therapy:  He is prescribed Effexor by his outpatient provider.   Past Medical History:  Past Medical History:  Diagnosis Date  . 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 Anemia, iron deficiency 03/24/2011   a. Recurrent GI bleed, tx with periodic iron infusions.  . 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 AVM (arteriovenous malformation) of duodenum, acquired    egds in 01/2012, 04/2011  . BPH (benign prostatic hyperplasia)   . CAD (coronary artery disease)    a. Cath 09/2012: moderate borderline CAD in mid LAD/small diagonal branch, mild RCA stenosis, to be managed medically   . Chronic lower back pain   . Diabetic peripheral neuropathy ([place]) 2014   foot pain.  . Essential hypertension 02/01/2012  . Fatty liver    on CT of 11/2010  . GERD (gastroesophageal reflux disease)   . GI bleed    a. Recurrent GI bleed, tx with IV iron. b. Per heme notes - likely AVMs 04/2012 (tx with cauterization several months ago).  . Hematuria    a. Urology note scan from 07/2012: cystoscopy without evidence for bladder lesion, only lateral hypertrophy of posterior urethra, bladder impression from BPH. b. Pt states  he had "some tests" scheduled for later in July 2014.  . High cholesterol   . Hyperlipidemia 02/01/2012  . Hypertension   . Hypertensive urgency 07/06/2016  . 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 Orthostatic hypotension    a. Tx with florinef.  . Peripheral neuropathy   . Polyp, colonic    Colonoscopy 01/2012 "benign" polyp  . Prostate cancer ([place])   . Sleep apnea    "had mask; couldn't sleep in it" (09/20/2012)  . Smoker   . SOB (shortness of breath) 07/06/2016  . Stage III chronic kidney disease ([name])    a.  Stage 3 (DM with complications ->CKD, peripheral neuropathy).  . Stroke ([place])    x 2  . Symptomatic anemia 04/03/2015  . Tinea pedis 05/24/2012  . Tobacco use 02/01/2012  . Type 2 diabetes, uncontrolled, with neuropathy ([place]) 02/01/2012  . Type II diabetes mellitus ([place])    a. Dx 1994, uncontrolled.     Past Surgical History:  Procedure Laterality Date  . ENTEROSCOPY N/A 03/01/2013   Procedure: ENTEROSCOPY;  Surgeon: Inda Castle, MD;  Location: [facility];  Service: Endoscopy;  Laterality: N/A;  . ENTEROSCOPY N/A 06/26/2016   Procedure: ENTEROSCOPY;  Surgeon: Milus Banister, MD;  Location: [facility];  Service: Endoscopy;  Laterality: N/A;  this is a push enteroscopy  . ENTEROSCOPY N/A 10/23/2016   Procedure: ENTEROSCOPY;  Surgeon: Gatha Mayer, MD;  [facility] ENDOSCOPY;  Service: Endoscopy;  Laterality: N/A;  . GIVENS CAPSULE STUDY N/A 04/05/2015   Procedure: GIVENS CAPSULE STUDY;  Surgeon: Gatha Mayer, MD;  Location: [facility];  Service: Endoscopy;  Laterality: N/A;  . INGUINAL HERNIA REPAIR Right 2011  . LEFT HEART CATH AND CORONARY ANGIOGRAPHY N/A 12/23/2016   Procedure: LEFT HEART CATH AND CORONARY ANGIOGRAPHY;  Surgeon: Leonie Man, MD;  Location: [facility] CV LAB;  Service: Cardiovascular;  Laterality: N/A;  . LEFT HEART CATHETERIZATION WITH CORONARY ANGIOGRAM N/A 09/21/2012   Procedure: LEFT HEART CATHETERIZATION WITH CORONARY ANGIOGRAM;  Surgeon: Peter M Martinique, MD;  Location: [facility] CATH LAB;  Service: Cardiovascular;  Laterality: N/A;  . LUMBAR DISC SURGERY  1980's  . LYMPHADENECTOMY Bilateral 01/19/2013   Procedure: LYMPHADENECTOMY;  Surgeon: Bernestine Amass, MD;  Location: [facility];  Service: Urology;  Laterality: Bilateral;  . ROBOT ASSISTED LAPAROSCOPIC RADICAL PROSTATECTOMY N/A 01/19/2013   Procedure: ROBOTIC ASSISTED LAPAROSCOPIC RADICAL PROSTATECTOMY;  Surgeon: Bernestine Amass, MD;  Location: [facility];  Service: Urology;  Laterality: N/A;  . SHOULDER OPEN  ROTATOR CUFF REPAIR Left 1980's   Family History:  Family History  Problem Relation Age of Onset  . Stroke Father        Died at 58  . Diabetes Mother   . Heart attack Brother        Died at 82  . Diabetes Sister   . Stomach cancer Brother   . Heart attack Sister    Family Psychiatric  History: Denies  Social History:  Social History   Substance and Sexual Activity  Alcohol Use No  . Alcohol/week: 0.0 oz   Comment: 09/20/2012 "Used to; stopped ~ 2009; never had problem w/it"     Social History   Substance and Sexual Activity  Drug Use No    Social History   Socioeconomic History  . Marital status: Married    Spouse name: None  . Number of children: 2  . Years of education: None  . Highest education level: None  Social Needs  .  Financial resource strain: None  . Food insecurity - worry: None  . Food insecurity - inability: None  . Transportation needs - medical: None  . Transportation needs - non-medical: None  Occupational History  . Occupation: taken out of work due to back  Tobacco Use  . Smoking status: Current Every Day Smoker    Packs/day: 0.50    Years: 30.00    Pack years: 15.00    Types: Cigarettes    Start date: 04/15/1968  . Smokeless tobacco: Never Used  . Tobacco comment: start back a couple of months ago  Substance and Sexual Activity  . Alcohol use: No    Alcohol/week: 0.0 oz    Comment: 09/20/2012 "Used to; stopped ~ 2009; never had problem w/it"  . Drug use: No  . Sexual activity: No  Other Topics Concern  . None  Social History Narrative   Regular exercise: rides horses   Caffeine use: occasionally   Additional Social History: He lives at home with his wife of 26 years. They have 2 children. His wife denies alcohol or illicit substance use.     Allergies:  No Known Allergies  Labs:  Results for orders placed or performed during the hospital encounter of 03/19/17 (from the past 48 hour(s))  Glucose, capillary     Status: Abnormal    Collection Time: 03/30/17  9:10 PM  Result Value Ref Range   Glucose-Capillary 217 (H) 65 - 99 mg/dL   Comment 1 Notify RN    Comment 2 Document in Chart   Glucose, capillary     Status: Abnormal   Collection Time: 03/31/17  6:30 AM  Result Value Ref Range   Glucose-Capillary 118 (H) 65 - 99 mg/dL   Comment 1 Notify RN    Comment 2 Document in Chart   CBC     Status: Abnormal   Collection Time: 03/31/17  8:51 AM  Result Value Ref Range   WBC 6.2 4.0 - 10.5 K/uL   RBC 4.05 (L) 4.22 - 5.81 MIL/uL   Hemoglobin 12.6 (L) 13.0 - 17.0 g/dL   HCT 37.5 (L) 39.0 - 52.0 %   MCV 92.6 78.0 - 100.0 fL   MCH 31.1 26.0 - 34.0 pg   MCHC 33.6 30.0 - 36.0 g/dL   RDW 16.6 (H) 11.5 - 15.5 %   Platelets 253 150 - 400 K/uL  Protime-INR     Status: None   Collection Time: 03/31/17  8:51 AM  Result Value Ref Range   Prothrombin Time 13.6 11.4 - 15.2 seconds   INR 1.05   Glucose, capillary     Status: Abnormal   Collection Time: 03/31/17 11:31 AM  Result Value Ref Range   Glucose-Capillary 129 (H) 65 - 99 mg/dL  Renal function panel     Status: Abnormal   Collection Time: 03/31/17  3:10 PM  Result Value Ref Range   Sodium 144 135 - 145 mmol/L   Potassium 5.3 (H) 3.5 - 5.1 mmol/L   Chloride 106 101 - 111 mmol/L   CO2 21 (L) 22 - 32 mmol/L   Glucose, Bld 131 (H) 65 - 99 mg/dL   BUN 91 (H) 6 - 20 mg/dL   Creatinine, Ser 11.98 (H) 0.61 - 1.24 mg/dL   Calcium 8.4 (L) 8.9 - 10.3 mg/dL   Phosphorus 7.0 (H) 2.5 - 4.6 mg/dL   Albumin 2.6 (L) 3.5 - 5.0 g/dL   GFR calc non Af Amer 4 (L) >60 mL/min   GFR  calc Af Amer 4 (L) >60 mL/min    Comment: (NOTE) The eGFR has been calculated using the CKD EPI equation. This calculation has not been validated in all clinical situations. eGFR's persistently <60 mL/min signify possible Chronic Kidney Disease.    Anion gap 17 (H) 5 - 15  Glucose, capillary     Status: Abnormal   Collection Time: 03/31/17  5:27 PM  Result Value Ref Range   Glucose-Capillary 126  (H) 65 - 99 mg/dL  Glucose, capillary     Status: Abnormal   Collection Time: 03/31/17  9:09 PM  Result Value Ref Range   Glucose-Capillary 142 (H) 65 - 99 mg/dL   Comment 1 Notify RN    Comment 2 Document in Chart   Glucose, capillary     Status: None   Collection Time: 04/01/17  6:10 AM  Result Value Ref Range   Glucose-Capillary 88 65 - 99 mg/dL   Comment 1 Notify RN    Comment 2 Document in Chart   Renal function panel     Status: Abnormal   Collection Time: 04/01/17  8:57 AM  Result Value Ref Range   Sodium 144 135 - 145 mmol/L   Potassium 4.5 3.5 - 5.1 mmol/L   Chloride 110 101 - 111 mmol/L   CO2 18 (L) 22 - 32 mmol/L   Glucose, Bld 99 65 - 99 mg/dL   BUN 99 (H) 6 - 20 mg/dL   Creatinine, Ser 12.74 (H) 0.61 - 1.24 mg/dL   Calcium 8.0 (L) 8.9 - 10.3 mg/dL   Phosphorus 7.4 (H) 2.5 - 4.6 mg/dL   Albumin 2.6 (L) 3.5 - 5.0 g/dL   GFR calc non Af Amer 3 (L) >60 mL/min   GFR calc Af Amer 4 (L) >60 mL/min    Comment: (NOTE) The eGFR has been calculated using the CKD EPI equation. This calculation has not been validated in all clinical situations. eGFR's persistently <60 mL/min signify possible Chronic Kidney Disease.    Anion gap 16 (H) 5 - 15  CBC     Status: Abnormal   Collection Time: 04/01/17  8:58 AM  Result Value Ref Range   WBC 5.8 4.0 - 10.5 K/uL   RBC 3.77 (L) 4.22 - 5.81 MIL/uL   Hemoglobin 11.5 (L) 13.0 - 17.0 g/dL   HCT 34.7 (L) 39.0 - 52.0 %   MCV 92.0 78.0 - 100.0 fL   MCH 30.5 26.0 - 34.0 pg   MCHC 33.1 30.0 - 36.0 g/dL   RDW 16.9 (H) 11.5 - 15.5 %   Platelets 276 150 - 400 K/uL  Glucose, capillary     Status: Abnormal   Collection Time: 04/01/17 10:19 AM  Result Value Ref Range   Glucose-Capillary 105 (H) 65 - 99 mg/dL    Current Facility-Administered Medications  Medication Dose Route Frequency Provider Last Rate Last Dose  . 0.9 %  sodium chloride infusion  100 mL Intravenous PRN Dwana Melena, MD      . 0.9 %  sodium chloride infusion  100 mL  Intravenous PRN Dwana Melena, MD      . 0.9 %  sodium chloride infusion   Intravenous Continuous Edwin Dada, MD 50 mL/hr at 03/31/17 2213    . acetaminophen (TYLENOL) tablet 650 mg  650 mg Oral Q6H PRN Earlie Counts, NP   650 mg at 03/31/17 2214   Or  . acetaminophen (TYLENOL) suppository 650 mg  650 mg Rectal Q6H PRN Juanda Crumble,  Wayna Chalet, NP      . ALPRAZolam Duanne Moron) tablet 0.25 mg  0.25 mg Oral Q48H PRN Madelon Lips, MD   0.25 mg at 04/01/17 0705  . alteplase (CATHFLO ACTIVASE) injection 2 mg  2 mg Intracatheter Once PRN Dwana Melena, MD      . antiseptic oral rinse (BIOTENE) solution 15 mL  15 mL Topical PRN Earlie Counts, NP      . aspirin suppository 300 mg  300 mg Rectal Daily Rise Patience, MD   300 mg at 03/26/17 1808   Or  . aspirin tablet 325 mg  325 mg Oral Daily Rise Patience, MD   325 mg at 03/30/17 1454  . atorvastatin (LIPITOR) tablet 80 mg  80 mg Oral q1800 Edwin Dada, MD   80 mg at 03/31/17 1742  . carvedilol (COREG) tablet 3.125 mg  3.125 mg Oral BID WC Danford, Suann Larry, MD   3.125 mg at 03/31/17 1742  . dronabinol (MARINOL) capsule 2.5 mg  2.5 mg Oral QAC lunch Danford, Suann Larry, MD   2.5 mg at 03/30/17 1453  . feeding supplement (ENSURE ENLIVE) (ENSURE ENLIVE) liquid 237 mL  237 mL Oral BID BM Danford, Suann Larry, MD   237 mL at 03/30/17 1821  . heparin injection 1,000 Units  1,000 Units Dialysis PRN Dwana Melena, MD      . insulin aspart (novoLOG) injection 0-9 Units  0-9 Units Subcutaneous TID WC Modena Jansky, MD   1 Units at 03/31/17 1742  . lidocaine (PF) (XYLOCAINE) 1 % injection 5 mL  5 mL Intradermal PRN Dwana Melena, MD      . lidocaine-prilocaine (EMLA) cream 1 application  1 application Topical PRN Dwana Melena, MD      . lidocaine-prilocaine (EMLA) cream   Topical Daily PRN Madelon Lips, MD      . pentafluoroprop-tetrafluoroeth Landry Dyke) aerosol 1 application  1 application Topical PRN Dwana Melena, MD       . polyvinyl alcohol (LIQUIFILM TEARS) 1.4 % ophthalmic solution 1 drop  1 drop Both Eyes QID PRN Earlie Counts, NP   1 drop at 03/29/17 2158  . QUEtiapine (SEROQUEL) tablet 25 mg  25 mg Oral TID Kerney Elbe, MD   25 mg at 03/31/17 2214  . RESOURCE THICKENUP CLEAR   Oral PRN Modena Jansky, MD      . thiamine 546m in normal saline (556m IVPB  500 mg Intravenous TID LiKerney ElbeMD 0 mL/hr at 03/31/17 1330 500 mg at 03/31/17 2213  . valproate (DEPACON) 500 mg in dextrose 5 % 50 mL IVPB  500 mg Intravenous Q12H KiGreta DoomMD   Stopped at 03/31/17 1709    Musculoskeletal: Strength & Muscle Tone: decreased due to physical deconditioning.  Gait & Station: UTA since patient was lying in bed. Wife reports that he uses a walker to ambulate. Patient leans: N/A  Psychiatric Specialty Exam: Physical Exam  Nursing note and vitals reviewed. Constitutional: He appears well-developed and well-nourished.  HENT:  Head: Normocephalic.  Neck: Normal range of motion.  Respiratory: Effort normal.  Musculoskeletal: Normal range of motion.  Neurological: He is alert.  Oriented to person   Skin: No rash noted.  Psychiatric: His behavior is normal. Thought content normal. His speech is delayed and slurred. Cognition and memory are impaired. He expresses impulsivity.    Review of Systems  Psychiatric/Behavioral: Negative for depression, hallucinations, substance abuse and suicidal ideas. The  patient is not nervous/anxious and does not have insomnia.   All other systems reviewed and are negative.   Blood pressure 116/83, pulse (!) 106, temperature 98 F (36.7 C), temperature source Oral, resp. rate 16, height _0  (1.702 m), weight 56.2 kg (123 lb 14.4 oz), SpO2 100 %.Body mass index is 19.41 kg/m.  General Appearance: Fairly Groomed, elderly, African American male with poor dentition, wearing a hospital gown and lying in bed. NAD.   Eye Contact:  Fair  Speech:  Garbled and Slow   Volume:  Decreased  Mood:  "Okay"  Affect:  Constricted  Thought Process:  Linear  Orientation:  Other:  Person  Thought Content:  Logical  Suicidal Thoughts:  No  Homicidal Thoughts:  No  Memory:  Immediate;   Poor Recent;   Poor Remote;   Poor  Judgement:  Impaired  Insight:  Lacking  Psychomotor Activity:  Decreased  Concentration:  Concentration: Fair and Attention Span: Fair  Recall:  Poor  Fund of Knowledge:  Poor  Language:  Poor  Akathisia:  No  Handed:  Right  AIMS (if indicated):   N/A  Assets:  Housing Social Support  ADL's:  Impaired  Cognition:  Impaired due to altered mental status.   Sleep:   Okay   Assessment:  TEIGAN SAHLI is a 72 y.o. male male who was admitted with altered mental status and found to have acute stroke. He has been difficult to treat due to agitation and combative behavior. His family feel like his periods of agitation correlate with receiving Depakote. According to medical records, it appears that his periods of agitation started prior to his last hospitalization and before Depakote was started. Depakote was started due to concern for possible seizures. It is possible that medications can be contributing to delirium which leads to agitation. Recommend continuing to use Seroquel for agitation and avoid other anticholingeric agents such as benzodiazepines since they can also lead to disinhibition. Recommend restarting Effexor since wife reports that it was helpful for mood lability. Would recommend considering another agent for seizure prophylaxis given family's concern about side effects with Depakote.   Treatment Plan Summary: -Restart home Effexor 37.5 mg daily for mood lability.  -Continue Seroquel 25 mg TID for agitation. Can increase by 12.5-25 mg increments daily as needed to treat agitation. Please inform family of black box warning for increased risk of mortality in elderly patients with dementia.  -Monitor for QTc prolongation. QTc 470 on  1/3.  -Consider another agent for seizure prophylaxis given family's concern about side effects with Depakote.  -Recommend judicious use of anticholinergic medications such as benzodiazepines since can worsen mental status.  -Psychiatry will sign off on patient at this time. Please consult psychiatry again as needed.   Disposition: Patient does not meet criteria for psychiatric inpatient admission.  Faythe Dingwall, DO 04/01/2017 12:03 PM

## 2017-04-01 NOTE — Care Management Important Message (Signed)
Important Message  Patient Details  Name: NASZIR COTT MRN: [id number] Date of Birth: Aug 28, 1945   Medicare Important Message Given:  Yes    Orbie Pyo 04/01/2017, 2:01 PM

## 2017-04-01 NOTE — Progress Notes (Signed)
Pt for HD this am report call to S.  Serano. HD RN  Will pick pt up b/t [phone]

## 2017-04-01 NOTE — Progress Notes (Addendum)
Daily Progress Note   Patient Name: [name]       Date: 04/01/2017 DOB: 1945/08/07  Age: 72 y.o. MRN#: [id number] Attending Physician: Nita Sells, MD Primary Care Physician: Elise Benne Admit Date: 03/19/2017  Reason for Consultation/Follow-up: Establishing goals of care  Subjective: Patient in bed, swatting at family members and providers, declining exam and medications. Son- [name] and daughterUlis Hill at bedside. Played music and encouraged family to disengage and allow patient to relax for time being. Discussed helpful communication techniques for patients with dementia/delirium- describing and asking permission before approaching, speaking in calm voice, most of all accepting that patient's actions may not be completely in patient's control. Music therapy appeared to have calming affect on patient.  Gave family support and answered their questions related to patient's hospitalization and disposition planning. They are concerned re: comments made regarding plans to dc to [facility] "tomorrow". Reviewed chart with family and noted that no referral or FL-2 had been made. Family asking for referral to [facility] and wishes for discussion of alternative options to [facility] placement as well. They have read reviews of [facility] and are uncertain about discharging patient there. They are also unclear about statements that have been made that patient's information has been discussed with rep from [facility], however, they have not consented to this.  They inquired about purpose of EEG. Discussed purpose is to evaluate for seizures. They inquired what treatment would be and we discussed would be medication.  Son asked about aggressive rehab and stated that patient showed strength- demonstrated for  patient's son, that while he has strength- his cognition and ability to follow commands (? Result of progressing dementia, ?seizures ?delirium) is the limiting factor. This is also the limiting factor in his ability to participate in outpatient dialysis- reviewed with [name] that patient would need to be able to sit for four hours without risk of pulling dialysis cath, or being aggressive in order to receive outpatient dialysis- and this is not happening.  [name] and [name] were receptive to information provided. They are encouraged by the fact that patient is eating. They and patient's wife remain hopeful for patient's mental status to improve.  Family was unaware of chaplain consult and inquired why this was placed.  Review of Systems  Unable to perform ROS: Dementia    Length  of Stay: 13  Current Medications: Scheduled Meds:  . aspirin  300 mg Rectal Daily   Or  . aspirin  325 mg Oral Daily  . atorvastatin  80 mg Oral q1800  . carvedilol  3.125 mg Oral BID WC  . feeding supplement (ENSURE ENLIVE)  237 mL Oral BID BM  . insulin aspart  0-9 Units Subcutaneous TID WC  . QUEtiapine  25 mg Oral TID  . [START ON 04/02/2017] venlafaxine XR  37.5 mg Oral Q breakfast    Continuous Infusions: . sodium chloride 50 mL/hr at 03/31/17 2213  . thiamine injection 0 mg (03/31/17 1330)  . valproate sodium Stopped (03/31/17 1709)    PRN Meds: acetaminophen **OR** acetaminophen, ALPRAZolam, antiseptic oral rinse, lidocaine-prilocaine, polyvinyl alcohol, RESOURCE THICKENUP CLEAR  Physical Exam  Constitutional:  cachetic  Cardiovascular: Intact distal pulses.  Abdominal: Soft.  Neurological: He is alert.  Does not follow commands, non verbal  Skin: Skin is warm and dry.  Psychiatric:  Judgement impaired,   Nursing note and vitals reviewed.           Vital Signs: BP 139/84 (BP Location: Right Arm)   Pulse (!) 102   Temp 98.4 F (36.9 C) (Oral)   Resp 18   Ht 5\' 7"  (1.702 m)   Wt 54 kg (119  lb 0.8 oz)   SpO2 100%   BMI 18.65 kg/m  SpO2: SpO2: 100 % O2 Device: O2 Device: Not Delivered O2 Flow Rate:    Intake/output summary:   Intake/Output Summary (Last 24 hours) at 04/01/2017 1657 Last data filed at 04/01/2017 1240 Gross per 24 hour  Intake 1090.83 ml  Output 2504 ml  Net -1413.17 ml   LBM: Last BM Date: 03/29/17 Baseline Weight: Weight: 54.4 kg (120 lb) Most recent weight: Weight: 54 kg (119 lb 0.8 oz)       Palliative Assessment/Data: PPS: 20%   Flowsheet Rows     Most Recent Value  Intake Tab  Referral Department  Hospitalist  Unit at Time of Referral  Med/Surg Unit  Palliative Care Primary Diagnosis  Nephrology  Date Notified  03/22/17  Palliative Care Type  New Palliative care  Reason for referral  Clarify Goals of Care  Date of Admission  03/19/17  Date first seen by Palliative Care  03/22/17  # of days Palliative referral response time  0 Day(s)  # of days IP prior to Palliative referral  3  Clinical Assessment  Psychosocial & Spiritual Assessment  Palliative Care Outcomes      Patient Active Problem List   Diagnosis Date Noted  . Protein-calorie malnutrition, severe 03/26/2017  . Leukocytosis   . Tobacco abuse   . History of prostate cancer   . Benign prostatic hyperplasia   . Advance care planning   . Goals of care, counseling/discussion   . Palliative care by specialist   . Altered mental status   . Acute CVA (cerebrovascular accident) ([place]) 03/19/2017  . DM (diabetes mellitus), type 2 with renal complications ([place]) 24/40/1027  . Acute encephalopathy 03/10/2017  . Subdural hematoma ([place]) 03/10/2017  . Dilated cardiomyopathy ([place]) 01/07/2017  . Acute respiratory failure with hypoxia ([place]) 12/22/2016  . Acute on chronic combined systolic and diastolic CHF (congestive heart failure) ([place]) 12/22/2016  . HLD (hyperlipidemia) 12/22/2016  . Elevated troponin I level   . Acute blood loss anemia   . Fall 10/22/2016  . GIB  (gastrointestinal bleeding) 10/21/2016  . Abnormal echocardiogram 08/06/2016  . Flash pulmonary edema (  [place]) 07/06/2016  . SOB (shortness of breath) 07/06/2016  . Hypertensive urgency 07/06/2016  . Anemia in chronic kidney disease, on chronic dialysis ([place])   . Anemia due to chronic kidney disease   . Diabetic retinopathy ([place]) 01/16/2016  . Dysphagia 12/21/2015  . Environmental allergies 12/04/2015  . Allergic rhinitis 10/28/2015  . ESRD (end stage renal disease) ([place]) 10/28/2015  . Stroke ([place]) 04/06/2015  . AVM (arteriovenous malformation) of small bowel, acquired with hemorrhage ([place]) 04/06/2015  . Malnutrition of moderate degree 04/05/2015  . Acute renal failure superimposed on stage 4 chronic kidney disease ([place]) 04/04/2015  . Symptomatic anemia 04/03/2015  . Benign paroxysmal positional vertigo 09/26/2014  . Weakness of right lower extremity 02/28/2014  . Metatarsalgia of both feet 09/13/2013  . Equinus deformity of foot, acquired 09/13/2013  . Diabetes mellitus type 2, controlled, with complications ([place]) 31/54/0086  . GERD (gastroesophageal reflux disease) 03/24/2013  . GI bleed 02/28/2013  . Cerebral infarction ([place]) 11/17/2012  . Dizziness and giddiness 10/31/2012  . Prostate cancer ([place]) 10/27/2012  . Diabetic neuropathy ([place]) 10/04/2012  . CAD (coronary artery disease) 10/04/2012  . Hoarseness 06/26/2012  . Hematuria 05/24/2012  . Tinea pedis 05/24/2012  . Back pain 02/23/2012  . Erectile dysfunction 02/03/2012  . Type 2 diabetes, uncontrolled, with neuropathy ([place]) 02/01/2012  . Essential hypertension 02/01/2012  . Tobacco use 02/01/2012  . Hyperlipidemia 02/01/2012  . Anemia of renal disease 05/14/2011    Palliative Care Assessment & Plan   Patient Profile: 72 y.o. male  with past medical history of prostate cancer, ESRD on dialysis, multifactorial anemia- AVM's, dementia, HTN, recent fall with facial fractures in November, recent hospitalization for subdural  hematoma, recent admission for encephalopathy, now admitted on 03/19/2017 with altered mental status and incidental finding on CT of cerebellar infarct.  During this admission patient was initially combative and agitated, requiring lorazepam. He is now lethargic, nonverbal, refusing medication, not able to eat or participate in therapies. Combative and agitated at times. Palliative medicine consulted for [place] discussion with family.      Assessment/Recommendations/Plan   Chart review shows irritation, depression and irritability starting in July, 2018 (progress note Mackie Pai, [place])- his mood and energy improved slightly with initiation of Effexor. He has had decline over the last year in cognition and mood. There are also reports of ongoing periods of agitation and combative behaviors with increasing episodes since his fall in November. He has history of CVA's, TIA's, micro hemorrhages and vascular changes that could possible explain progressing vascular dementia that is consistent with his presentation of stairstep and waxing/waning decline in nutrition, function, and cognition. Discussed this with family initially and again today.   Family inquiring about medications causing agitation- noted that patient was agitated prior to medications- reviewed chart- reviewed trajectory of illness  Discussed with family limitations of outpatient options for continuing dialysis and need for disposition discussion- they are open to discussion, but felt they were being pushed to [facility] and they are not satisfied at this point that [facility] is their only option  They desire consult with social worker for full range of possible placement options if dialysis is able to be continued- this includes exploring a [facility]  Recommend using calming techniques and voice levels to interact with patient, music therapy when needing to calm patient or proceed with procedures   Family was receptive  to information discussed today.   Note chaplain consult placed for HCPOA paperwork- patient is not capable of  completing HCPOA paperwork due to being unable to make decisions for himself. Spouse- Guerry Minors is primary decision maker by default due to the fact that she is his spouse. However, she makes decisions with significant input from patient's children.   Code Status:  DNR  Prognosis:   < 6 months  Discharge Planning:  To Be Determined  Care plan was discussed with patient's son- Roch, and daughterUlis Hill.  Thank you for allowing the Palliative Medicine Team to assist in the care of this patient.   Time In: 1530 Time Out: 1730 Total Time 120 minutes Prolonged Time Billed Yes      Greater than 50%  of this time was spent counseling and coordinating care related to the above assessment and plan.  Mariana Kaufman, AGNP-C Palliative Medicine   Please contact Palliative Medicine Team phone at [phone] for questions and concerns.

## 2017-04-02 LAB — GLUCOSE, CAPILLARY
Glucose-Capillary: 133 mg/dL — ABNORMAL HIGH (ref 65–99)
Glucose-Capillary: 95 mg/dL (ref 65–99)
Glucose-Capillary: 161 mg/dL — ABNORMAL HIGH (ref 65–99)

## 2017-04-02 MED ORDER — ALPRAZOLAM 0.25 MG PO TABS: 0.2500 mg | ORAL_TABLET | ORAL | 0 refills | Status: DC | PRN

## 2017-04-02 MED ORDER — VALPROIC ACID 500 MG PO CPDR: 500.0000 mg | DELAYED_RELEASE_CAPSULE | Freq: Two times a day (BID) | ORAL | 0 refills | Status: DC

## 2017-04-02 MED ORDER — QUETIAPINE FUMARATE 25 MG PO TABS: 25.0000 mg | ORAL_TABLET | Freq: Three times a day (TID) | ORAL | 0 refills | Status: DC

## 2017-04-02 MED ORDER — VENLAFAXINE HCL ER 37.5 MG PO CP24: 37.5000 mg | ORAL_CAPSULE | Freq: Every day | ORAL | 0 refills | Status: DC

## 2017-04-02 MED ORDER — RESOURCE THICKENUP CLEAR PO POWD: 1.0000 g | ORAL | Status: DC | PRN

## 2017-04-02 MED ORDER — OXYCODONE HCL 20 MG/ML PO CONC: 10.0000 mg | Freq: Four times a day (QID) | ORAL | Status: DC

## 2017-04-02 NOTE — Progress Notes (Signed)
Physical Therapy Treatment Patient Details Name: [name] MRN: [id number] DOB: 02/13/1946 Today's Date: 04/02/2017    History of Present Illness Pt is a 72 y.o. male admitted from home on 03/19/17 with worsening confusion; MRI/MRA shows acute ischemic nonhemorrhagic infact at left cerbellar hemisphere, R posterior fossa subdural collection, and multiple chronic mic hemorrhages throughout brain. Pt recently admitted with SDH on 02/01/17, d/c to SNF and re-admitted 03/10/2017 for acute encephalopathy, then d/c home. PMH includes CVA, TIA, CKD III, DM, HTN, HLD, CAD, PMH, AVM, prostate CA. sleep apnea.    PT Comments    Patient not participating in mobility or EOB activities including eating during this session.  Keeps L leg curled up despite attempts at repositioning and agitated with any movement away from supine.  Currently not progressing with PT, but will attempt one more session to see if mental status improved to allow participation.  Would need LTACH for continued dialysis as not able to be positioned safely in chair at this time.    Follow Up Recommendations  LTACH     Equipment Recommendations  None recommended by PT    Recommendations for Other Services       Precautions / Restrictions Precautions Precautions: Fall    Mobility  Bed Mobility Overal bed mobility: Needs Assistance Bed Mobility: Supine to Sit;Sit to Supine     Supine to sit: Max assist;+2 for safety/equipment Sit to supine: Mod assist   General bed mobility comments: mulitple attempts to assist pt to sit EOB and he pushed PT away several times with his foot, able to achieve sitting with +2 A for safety moving legs off bed and lifting trunk, but pt leaning over to lie back down only up EOB as long as PT holding him up; to supine pt able to get head to pillow but extra time and cues to get one leg in bed, finally assisted with other leg into bed and positioning up to [facility] with +2 A.  Transfers                    Ambulation/Gait                 Stairs            Wheelchair Mobility    Modified Rankin (Stroke Patients Only) Modified Rankin (Stroke Patients Only) Pre-Morbid Rankin Score: Moderately severe disability(3 weeks ago ambulatory with 2 person A) Modified Rankin: Severe disability     Balance Overall balance assessment: Needs assistance   Sitting balance-Leahy Scale: Zero Sitting balance - Comments: pushing to lie back down in sitting, help him up and attempted to distract with pudding and pt pushed spoon away into the floor when PT tried to hand it to him.  Postural control: Right lateral lean     Standing balance comment: NT                            Cognition Arousal/Alertness: Lethargic Behavior During Therapy: Flat affect Overall Cognitive Status: Difficult to assess                                        Exercises      General Comments        Pertinent Vitals/Pain Faces Pain Scale: Hurts even more Pain Location: L Leg when PT tried to straighten it  out Pain Descriptors / Indicators: Grimacing Pain Intervention(s): Monitored during session;Repositioned    Home Living                      Prior Function            PT Goals (current goals can now be found in the care plan section) Progress towards PT goals: Not progressing toward goals - comment;PT to reassess next treatment    Frequency    Min 3X/week      PT Plan Frequency needs to be updated;Discharge plan needs to be updated    Co-evaluation              AM-PAC PT "6 Clicks" Daily Activity  Outcome Measure  Difficulty turning over in bed (including adjusting bedclothes, sheets and blankets)?: Unable Difficulty moving from lying on back to sitting on the side of the bed? : Unable Difficulty sitting down on and standing up from a chair with arms (e.g., wheelchair, bedside commode, etc,.)?: Unable Help needed moving to and  from a bed to chair (including a wheelchair)?: Total Help needed walking in hospital room?: Total Help needed climbing 3-5 steps with a railing? : Total 6 Click Score: 6    End of Session   Activity Tolerance: Treatment limited secondary to agitation Patient left: in bed;with bed alarm set;with call [place]/phone within reach   PT Visit Diagnosis: Other abnormalities of gait and mobility (R26.89);Other symptoms and signs involving the nervous system (R29.898)     Time: 0950-1007 PT Time Calculation (min) (ACUTE ONLY): 17 min  Charges:  $Therapeutic Activity: 8-22 mins                    G CodesMagda Hill, [place] [phone] 04/02/2017    [name] 04/02/2017, 12:40 PM

## 2017-04-02 NOTE — Care Management Note (Signed)
Case Management Note  Patient Details  Name: [name] MRN: [id number] Date of Birth: 04/08/1945  Subjective/Objective:                    Action/Plan: CM spoke to [place] with [facility] and they are not able to offer [name] a bed. CM is aware that [facility] has beds.  CM called and spoke to Mrs Adebayo and she is in agreement with [facility]. She states that is not her choice but understands he cant go to SNF and do outpatient dialysis d/t not being able to sit in a chair. She hopes that he does well enough at [facility] to be able to get into a SNF. CM following.  Expected Discharge Date:                  Expected Discharge Plan:  Long Term Acute Care (LTAC)  In-House Referral:     Discharge planning Services  CM Consult  Post Acute Care Choice:  Home Health, Resumption of Svcs/PTA Provider Choice offered to:  Patient  DME Arranged:    DME Agency:     HH Arranged:    [place] Agency:     Status of Service:  In process, will continue to follow  If discussed at Long Length of Stay Meetings, dates discussed:    Additional Comments:  [name], RN 04/02/2017, 10:33 AM

## 2017-04-02 NOTE — Discharge Summary (Signed)
Physician Discharge Summary  KRISTEN BUSHWAY CVE:[id number] DOB: January 14, 1946 DOA: 03/19/2017  PCP: Mackie Pai, PA-C  Admit date: 03/19/2017 Discharge date: 04/02/2017  Time spent: 35 minutes  Recommendations for Outpatient Follow-up:  1. Patient will be transferring to long-term assisted care facility going forward 2. Would recommend basic metabolic panel as well as CBC in about 1 week 3. New scripts of Seroquel in addition to Depakote, Also prescribing Ativan q. other day as needed agitation and--have also prescribed his prior to admission Effexor in addition 4. Continue Thiamine 100 mg daily going forward--had high dose protocol during hospital stay 5. Would continue with dialysis Monday Wednesday Friday if allows for the same  Discharge Diagnoses:  Principal Problem:   Altered mental status Active Problems:   Anemia of renal disease   ESRD (end stage renal disease) ([place])   Acute encephalopathy   Acute CVA (cerebrovascular accident) ([place])   DM (diabetes mellitus), type 2 with renal complications ([place])   Advance care planning   Goals of care, counseling/discussion   Palliative care by specialist   Leukocytosis   Tobacco abuse   History of prostate cancer   Benign prostatic hyperplasia   Protein-calorie malnutrition, severe   Failure to thrive in adult   Discharge Condition: gaurded  Diet recommendation:  Renal heart healthy  Filed Weights   03/31/17 2100 04/01/17 0830 04/01/17 1240  Weight: 56.2 kg (123 lb 14.4 oz) 56.2 kg (123 lb 14.4 oz) 54 kg (119 lb 0.8 oz)    History of present illness:  72 yo M with ESRD on MWF HD, HTN, DM prior not on meds, HTN, CHr Sys HF. CAD on med management-prior SDH 04/12/16-extensive Orbital #'s, Small L SDH and recent traumatic subdural hematoma, not evacuated who presents with stroke.  Now has failure to thrive.  Hospice referral was recommended, family/POA have requested continued aggressive care and hospice plans were canceled. Neurology saw  patient end evnetually felt this was more in keeping with acute worsening of dementia- Aggressive at HD on 1/14. Dialysis stopped prematurely.   Hospital Course:  Encephalopathy Delirium Failure to thrive See Dr. [name] note from 1/6.  Per Neurology--abrupt decline over the past week pta and he feels that there may be some chance of recovery to the level of function end of last year. MRI: Stable subacute 10 mm infarct left cerebellum, no other acute infarct, multiple remote lacunar infarcts of the basal ganglia and corona radiata, multiple foci of chronic microhemorrhages/stable and advanced atrophy and white matter disease . EEG: Encephalopathic and no seizure activity. Supplementing thiamine and was initially placed on high-dose therapy and protocol with 500 -Patient has been more alert --neurology does not feel he is waxing and waning although he has been aggressive with multiple medical staff at times.  Counseled  LTM EEG to assess for subclinical seizures--family requested to discontinue the depakote on 1/16 and see how he does mentally with regards to this - Family considered PEG tube placement by IR scheduled for 1/15-could not be done, Need anesthesia -Seen by psychiatry 1/16 and   Felt like no overt cause possibly medication-felt as if Effexor 37.5 for lability of mood and Seroquel 25 3 times daily would be appropriate -Have been realistic with famil that getting scheduled BZD with dialysis is not a good option and this may need to be withdrawn -chaplain to get HCPOA paperwork sorted out, however family was unclear as to why chaplain came by and did not get this sorted out -Patient has a bed  available at [facility] and I have mentioned to son that barring other options patient will probably be transferred and discharged there for further management going forward  Acute Stroke -Continue daily aspirin, statin, Coreg if he takes them. Speech therapy recommended dysphagia 1 diet and nectar  thickened liquids.  Patient has been eating more and has been taking more p.o. - As discussed with neurology, small stroke unlikely to have caused his abrupt decline in mental status. Repeat MRI brain 1/12 without new or acute findings. -PEG tube on hold for now and allow soft diet   Tachycardia Leukocytosis There are no localizing signs of infection.  ESRD - Consult to Nephrology for routine HD.  - Patient will need LTAC on d/c - Patient has demonstrated aggressive behavior with the Nephrologist over the weekend and at HD today.  - further discussions as per nephrology  Diabetes - Mildly uncontrolled CBGs. cbg [id number]  Anemia of renal disease Stable.  Other medications -cont venlafaxine and ranitidine and dronabinol if willing/able to take   Consultants:   Neurology  Nephrology  Palliative Care  IR  Procedures:   MRI brain  MRA head  Carotid US  Echocardiogram     Discharge Exam: Vitals:   04/01/17 2147 04/02/17 0213  BP: 123/60 (!) 142/87  Pulse: (!) 105 (!) 107  Resp: 18 20  Temp:  97.7 F (36.5 C)  SpO2: 99% 99%    Refusing exam No new issues  Discharge Instructions   Discharge Instructions    Diet - low sodium heart healthy   Complete by:  As directed    Discharge instructions   Complete by:  As directed    Would follow for improvement of dementia going forward at [facility]   Increase activity slowly   Complete by:  As directed      Allergies as of 04/02/2017   No Known Allergies     Medication List    STOP taking these medications   dronabinol 2.5 MG capsule Commonly known as:  MARINOL   venlafaxine 37.5 MG tablet Commonly known as:  EFFEXOR Replaced by:  venlafaxine XR 37.5 MG 24 hr capsule     TAKE these medications   ALPRAZolam 0.25 MG tablet Commonly known as:  XANAX Take 1 tablet (0.25 mg total) by mouth every other day as needed for anxiety (please give 30 min prior to dialysis.  Please HOLD this medication if  pt has received any sedation/ procedures for that day).   ARTIFICIAL TEAR SOLUTION OP Instill 1-2 drops into both eyes two to three times a day as needed for dry eyes   atorvastatin 80 MG tablet Commonly known as:  LIPITOR Take 80 mg by mouth at bedtime.   carvedilol 3.125 MG tablet Commonly known as:  COREG Take 3.125 mg by mouth 2 (two) times daily with a meal.   multivitamin Tabs tablet Take 1 tablet by mouth every Monday, Wednesday, and Friday.   QUEtiapine 25 MG tablet Commonly known as:  SEROQUEL Take 1 tablet (25 mg total) by mouth 3 (three) times daily.   ranitidine 150 MG capsule Commonly known as:  ZANTAC Take 1 capsule (150 mg total) by mouth 2 (two) times daily.   RESOURCE THICKENUP CLEAR Powd Take 1 g by mouth as needed.   venlafaxine XR 37.5 MG 24 hr capsule Commonly known as:  EFFEXOR-XR Take 1 capsule (37.5 mg total) by mouth daily with breakfast. Replaces:  venlafaxine 37.5 MG tablet      No Known  Allergies Follow-up Information    Garvin Fila, MD. Go on 05/12/2017.   Specialties:  Neurology, Radiology Contact information: [address] [place] [address] [phone]            The results of significant diagnostics from this hospitalization (including imaging, microbiology, ancillary and laboratory) are listed below for reference.    Significant Diagnostic Studies: Ct Head Wo Contrast  Result Date: 03/19/2017 CLINICAL DATA:  72 year old male for follow-up of subdural hematoma. Unchanged altered mental status and encephalopathy. EXAM: CT HEAD WITHOUT CONTRAST TECHNIQUE: Contiguous axial images were obtained from the base of the skull through the vertex without intravenous contrast. COMPARISON:  03/12/2017 MR, 03/10/2017 CT and prior studies FINDINGS: Brain: The right posterior fossa subdural collection is no longer visualized. A new 1 cm hypodensity within the left cerebellum (image 7 -8) likely represents new ischemic  changes/infarct or edema. Atrophy, chronic small-vessel white matter ischemic changes and remote bilateral basal ganglia and thalamic infarcts again noted. No new hemorrhage or extra-axial collection noted. Vascular: Atherosclerotic calcifications noted Skull: No acute abnormality Sinuses/Orbits: No acute abnormality Other: None IMPRESSION: 1. New 1 cm left cerebellar hypodensity which may represent ischemic changes/infarct/edema. No hemorrhage. 2. Interval resolution of right posterior fossa subdural collection. No evidence of new hemorrhage or extra-axial collection. 3. Atrophy, chronic small-vessel white matter ischemic changes and remote basal ganglia/thalamic infarcts. Electronically Signed   By: Margarette Canada M.D.   On: 03/19/2017 15:32   Ct Head Wo Contrast  Result Date: 03/10/2017 CLINICAL DATA:  Altered mental status for 2 days. EXAM: CT HEAD WITHOUT CONTRAST TECHNIQUE: Contiguous axial images were obtained from the base of the skull through the vertex without intravenous contrast. COMPARISON:  02/01/2017 FINDINGS: Brain: Left cerebral hemisphere subdural blood has resolved from the previous examination. However, there is a new extra-axial low-density collection adjacent to the inferior right cerebellar hemisphere. This is seen on sequence 3, image 7 and best seen on the coronal reformats, sequence 5, image 50. This low-density structure is mildly heterogeneous. This structure measures 2.6 x 1.0 x 0.7 cm. There is no significant mass effect from this new extra-axial collection. No evidence for midline shift, hydrocephalus or large infarct. Again noted is extensive white matter disease with old ischemic changes and remote lacune infarcts in the deep white matter tracts and deep gray nuclei. Vascular: No hyperdense vessel or unexpected calcification. Skull: Normal. Negative for fracture or focal lesion. Sinuses/Orbits: No acute finding. Other: None. IMPRESSION: New small subdural collection in the right  posterior fossa. Findings are suggestive for a subacute subdural hematoma or hygroma. The left cerebral hemisphere subdural hematoma has resolved since 02/01/2017. Chronic white matter changes compatible with small vessel ischemic disease and remote lacune infarcts. Electronically Signed   By: Markus Daft M.D.   On: 03/10/2017 15:46   [name] Contrast  Result Date: 03/28/2017 CLINICAL DATA:  Focal neuro deficits greater than 6 hours. Recent stroke. Question new stroke. EXAM: MRI HEAD WITHOUT CONTRAST TECHNIQUE: Multiplanar, multiecho pulse sequences of the brain and surrounding structures were obtained without intravenous contrast. COMPARISON:  MRI of the brain 03/20/2017.  CT head 03/19/2017. FINDINGS: Brain: Diffusion-weighted images again demonstrate the subacute nonhemorrhagic 1 cm left cerebellar infarct. Scattered foci of chronic microhemorrhage are again seen. The largest areas in the left lentiform nucleus. These are better visualized on today's study with less motion. Multiple remote lacunar infarcts of the basal ganglia are stable. Remote thalamic infarcts are present. White matter changes extend into  the brainstem. Remote infarcts are again seen within the corona radiata bilaterally. Ventricles are proportionate to the degree of atrophy. No significant extra-axial fluid collection is present. Vascular: Flow is present in the major intracranial arteries. Skull and upper cervical spine: The skull base is within normal limits. The craniocervical junction is normal. Slight anterolisthesis is present at C3-4. Marrow signal is diffusely depressed. Is likely related to anemia and end-stage renal disease. Sinuses/Orbits: The paranasal sinuses are clear. There is some fluid in the left mastoid air cells. Globes and orbits are within normal limits. IMPRESSION: 1. Stable subacute 10 mm infarct involving the left cerebellum. 2. No other acute infarct. 3. Multiple remote lacunar infarcts of the basal ganglia  and corona radiata are stable. 4. Multiple foci of chronic microhemorrhage are stable. 5. Advanced atrophy and white matter disease. Electronically Signed   By: San Morelle M.D.   On: 03/28/2017 10:21   [name] Contrast  Result Date: 03/20/2017 CLINICAL DATA:  Initial evaluation for acute altered mental status, possible stroke. EXAM: MRI HEAD WITHOUT CONTRAST MRA HEAD WITHOUT CONTRAST TECHNIQUE: Multiplanar, multiecho pulse sequences of the brain and surrounding structures were obtained without intravenous contrast. Angiographic images of the head were obtained using MRA technique without contrast. COMPARISON:  Prior CT from 03/19/2017 as well as recent MRI from 03/12/2017. FINDINGS: MRI HEAD FINDINGS Brain: Advanced parenchymal volume loss. Chronic microvascular ischemic disease with scatter remote lacunar infarcts involving the bilateral basal ganglia and thalami and as well as the pons, stable from previous. 1 cm acute ischemic nonhemorrhagic infarct present within the mid left cerebellar hemisphere, corresponding to previously noted hypodensity seen on prior CT (series 3, image 9). No associated mass effect. For no other evidence for acute or subacute ischemia. Gray-white matter differentiation otherwise maintained. No acute intracranial hemorrhage. Multiple remote chronic micro hemorrhages again noted, consistent with chronic hypertensive encephalopathy. No mass lesion, midline shift or mass effect. Ventricular prominence related to global parenchymal volume loss without hydrocephalus. Previously noted subdural collection at the right posterior fossa is nearly resolved now measuring up to 2 mm in maximal thickness without significant mass effect. No other new extra-axial collection. Major dural sinuses are grossly patent. Vascular: Major intracranial vascular flow voids maintained at the skullbase. Skull and upper cervical spine: Craniocervical junction normal. Bone marrow signal intensity  within normal limits. No scalp soft tissue abnormality. Sinuses/Orbits: Globes orbital soft tissues within normal limits. Paranasal sinuses are largely clear. No significant mastoid effusion. Inner ear structures normal. Other: None. MRA HEAD FINDINGS ANTERIOR CIRCULATION: Distal cervical segments of the internal carotid arteries are patent with antegrade flow. Petrous, cavernous, and supraclinoid segments patent bilaterally without flow-limiting stenosis. Mild atheromatous irregularity within the carotid siphons. A1 segments, anterior communicating artery common anterior cerebral arteries widely patent. Atheromatous irregularity throughout the M1 segments and distal MCA branches without high-grade stenosis, slightly worse on the right. POSTERIOR CIRCULATION: Visualized vertebral arteries patent to the vertebrobasilar junction without stenosis. Partially visualized posterior inferior cerebral artery is patent. Basilar patent to its distal aspect without stenosis. Superior cerebral arteries and posterior cerebral artery is patent bilaterally without flow-limiting stenosis. Distal small vessel atheromatous irregularity within the PCA branches bilaterally. No aneurysm. IMPRESSION: MRI HEAD IMPRESSION: 1. 1 cm acute ischemic nonhemorrhagic infarct positioned at the central left cerebellar hemisphere. 2. Near interval resolution of previously seen right posterior fossa subdural collection, now measuring up to 2 mm in thickness without mass effect. 3. A advanced parenchymal volume loss with chronic micro vessel ischemic disease  in scatter remote lacunar infarcts as above. 4. Multiple chronic micro hemorrhages throughout the brain, consistent with underlying chronic hypertension. MRA HEAD IMPRESSION: Intracranial atherosclerosis as above without large or proximal arterial branch occlusion. No high-grade or correctable stenosis within the intracranial circulation. Electronically Signed   By: Jeannine Boga M.D.   On:  03/20/2017 03:07   [name] Contrast  Result Date: 03/12/2017 CLINICAL DATA:  Altered level consciousness. Fell 1 month ago resulting in subdural hematoma. History of end-stage renal disease on dialysis, hypertension, diabetes, hyperlipidemia. EXAM: MRI HEAD WITHOUT CONTRAST TECHNIQUE: Multiplanar, multiecho pulse sequences of the brain and surrounding structures were obtained without intravenous contrast. COMPARISON:  CT HEAD March 10, 2017 and MRI of the head January 20, 2017 FINDINGS: BRAIN: No reduced diffusion to suggest acute ischemia. Numerous scattered supra and infratentorial microhemorrhage predominately in central sister fusion. Old LEFT basal ganglia infarct with susceptibility artifact. Old RIGHT basal ganglia lacunar infarct and multiple thalamus lacunar infarcts. Prominent basal ganglia and thalamus perivascular spaces assisted with chronic small vessel ischemic disease. Patchy to confluent supratentorial pontine white matter T2 hyperintensities. Old small bilateral cerebellar infarcts. Moderate to severe parenchymal brain volume loss for age. No hydrocephalus. Small T2 bright RIGHT posterior fossa extra-axial collection without mass effect. Resolution of supratentorial subdural collections. Mild dural thickening associated with prior sub dural hematoma. VASCULAR: Normal major intracranial vascular flow voids present at skull base. SKULL AND UPPER CERVICAL SPINE: No abnormal sellar expansion. 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 Craniocervical junction maintained. SINUSES/ORBITS: The mastoid air-cells and included paranasal sinuses are well-aerated. The included ocular globes and orbital contents are non-suspicious. OTHER: None. IMPRESSION: 1. No acute intracranial process. 2. Small RIGHT posterior fossa subdural collection as seen on recent CT without mass effect. Resolution of supratentorial subdural hematoma. 3. Moderate to severe parenchymal brain  volume loss. 4. Moderate to severe chronic small vessel ischemic disease. Multiple old small vessel infarcts. 5. Extensive micro hemorrhages associated with chronic hypertension. Electronically Signed   By: Elon Alas M.D.   On: 03/12/2017 16:20   Dg Chest Port 1 View  Result Date: 03/22/2017 CLINICAL DATA:  72 year old male with cough EXAM: PORTABLE CHEST 1 VIEW COMPARISON:  Prior chest x-ray 03/10/2017 FINDINGS: Stable cardiac mediastinal contours. Atherosclerotic calcifications again noted in the transverse aorta. The lungs are clear. Minimal chronic bronchitic changes are stable. No acute osseous abnormality. No pneumothorax or pleural effusion. IMPRESSION: 1. No acute cardiopulmonary process. 2.  Aortic Atherosclerosis (ICD10-170.0) Electronically Signed   By: Jacqulynn Cadet M.D.   On: 03/22/2017 14:34   Dg Chest Port 1 View  Result Date: 03/10/2017 CLINICAL DATA:  Altered mental status EXAM: PORTABLE CHEST 1 VIEW COMPARISON:  12/22/2016 FINDINGS: Mild cardiomegaly. No focal consolidation or effusion. Mild aortic atherosclerosis. No pneumothorax. IMPRESSION: Cardiomegaly.  Negative for edema or infiltrate. Electronically Signed   By: Donavan Foil M.D.   On: 03/10/2017 16:21   Mr Jodene Nam Head Wo Contrast  Result Date: 03/20/2017 CLINICAL DATA:  Initial evaluation for acute altered mental status, possible stroke. EXAM: MRI HEAD WITHOUT CONTRAST MRA HEAD WITHOUT CONTRAST TECHNIQUE: Multiplanar, multiecho pulse sequences of the brain and surrounding structures were obtained without intravenous contrast. Angiographic images of the head were obtained using MRA technique without contrast. COMPARISON:  Prior CT from 03/19/2017 as well as recent MRI from 03/12/2017. FINDINGS: MRI HEAD FINDINGS Brain: Advanced parenchymal volume loss. Chronic microvascular ischemic disease with scatter remote lacunar infarcts involving the bilateral basal ganglia and thalami and  as well as the pons, stable from  previous. 1 cm acute ischemic nonhemorrhagic infarct present within the mid left cerebellar hemisphere, corresponding to previously noted hypodensity seen on prior CT (series 3, image 9). No associated mass effect. For no other evidence for acute or subacute ischemia. Gray-white matter differentiation otherwise maintained. No acute intracranial hemorrhage. Multiple remote chronic micro hemorrhages again noted, consistent with chronic hypertensive encephalopathy. No mass lesion, midline shift or mass effect. Ventricular prominence related to global parenchymal volume loss without hydrocephalus. Previously noted subdural collection at the right posterior fossa is nearly resolved now measuring up to 2 mm in maximal thickness without significant mass effect. No other new extra-axial collection. Major dural sinuses are grossly patent. Vascular: Major intracranial vascular flow voids maintained at the skullbase. Skull and upper cervical spine: Craniocervical junction normal. Bone marrow signal intensity within normal limits. No scalp soft tissue abnormality. Sinuses/Orbits: Globes orbital soft tissues within normal limits. Paranasal sinuses are largely clear. No significant mastoid effusion. Inner ear structures normal. Other: None. MRA HEAD FINDINGS ANTERIOR CIRCULATION: Distal cervical segments of the internal carotid arteries are patent with antegrade flow. Petrous, cavernous, and supraclinoid segments patent bilaterally without flow-limiting stenosis. Mild atheromatous irregularity within the carotid siphons. A1 segments, anterior communicating artery common anterior cerebral arteries widely patent. Atheromatous irregularity throughout the M1 segments and distal MCA branches without high-grade stenosis, slightly worse on the right. POSTERIOR CIRCULATION: Visualized vertebral arteries patent to the vertebrobasilar junction without stenosis. Partially visualized posterior inferior cerebral artery is patent. Basilar  patent to its distal aspect without stenosis. Superior cerebral arteries and posterior cerebral artery is patent bilaterally without flow-limiting stenosis. Distal small vessel atheromatous irregularity within the PCA branches bilaterally. No aneurysm. IMPRESSION: MRI HEAD IMPRESSION: 1. 1 cm acute ischemic nonhemorrhagic infarct positioned at the central left cerebellar hemisphere. 2. Near interval resolution of previously seen right posterior fossa subdural collection, now measuring up to 2 mm in thickness without mass effect. 3. A advanced parenchymal volume loss with chronic micro vessel ischemic disease in scatter remote lacunar infarcts as above. 4. Multiple chronic micro hemorrhages throughout the brain, consistent with underlying chronic hypertension. MRA HEAD IMPRESSION: Intracranial atherosclerosis as above without large or proximal arterial branch occlusion. No high-grade or correctable stenosis within the intracranial circulation. Electronically Signed   By: Jeannine Boga M.D.   On: 03/20/2017 03:07    Microbiology: No results found for this or any previous visit (from the past 240 hour(s)).   Labs: Basic Metabolic Panel: Recent Labs  Lab 03/27/17 0745 03/31/17 1510 04/01/17 0857  NA 141 144 144  K 4.3 5.3* 4.5  CL 103 106 110  CO2 20* 21* 18*  GLUCOSE 118* 131* 99  BUN 71* 91* 99*  CREATININE 10.17* 11.98* 12.74*  CALCIUM 8.5* 8.4* 8.0*  PHOS 7.6* 7.0* 7.4*   Liver Function Tests: Recent Labs  Lab 03/27/17 0745 03/31/17 1510 04/01/17 0857  ALBUMIN 2.8* 2.6* 2.6*   No results for input(s): LIPASE, AMYLASE in the last 168 hours. No results for input(s): AMMONIA in the last 168 hours. CBC: Recent Labs  Lab 03/27/17 0745 03/31/17 0851 04/01/17 0858  WBC 8.1 6.2 5.8  HGB 10.9* 12.6* 11.5*  HCT 33.4* 37.5* 34.7*  MCV 94.4 92.6 92.0  PLT 167 253 276   Cardiac Enzymes: No results for input(s): CKTOTAL, CKMB, CKMBINDEX, TROPONINI in the last 168  hours. BNP: BNP (last 3 results) Recent Labs    07/25/16 0430  BNP 1,389.4*    ProBNP (last  3 results) Recent Labs    07/09/16 1438 07/31/16 1104 08/19/16 1155  PROBNP 1,044.0* 343.0* 349.0*    CBG: Recent Labs  Lab 04/01/17 1019 04/01/17 1412 04/01/17 1651 04/01/17 2230 04/02/17 0612  GLUCAP 105* 90 194* 221* 133*       Signed:  Nita Sells MD   [facility] 04/02/2017, 10:48 AM

## 2017-04-02 NOTE — Progress Notes (Addendum)
Daily Progress Note   Patient Name: [name]       Date: 04/02/2017 DOB: January 08, 1946  Age: 72 y.o. MRN#: [id number] Attending Physician: Nita Sells, MD Primary Care Physician: Elise Benne Admit Date: 03/19/2017  Reason for Consultation/Follow-up: Establishing goals of care  Subjective: Patient asleep this morning. Arouses to voice. No family at bedside. Discussed family's questions with social worker, Kathlee Nations. Patient has been officially denied placement at [facility]- she has requested representative come meet with family to discuss this. Additionally, there has been an official referral and bed offer from [facility]- this is only option for local LTAC with family's current GOC of continued dialysis.  Attempted to call family to clarify this information per their request and to continue [place] conversation.    Review of Systems  Unable to perform ROS: Dementia    Length of Stay: 14  Current Medications: Scheduled Meds:  . aspirin  300 mg Rectal Daily   Or  . aspirin  325 mg Oral Daily  . atorvastatin  80 mg Oral q1800  . carvedilol  3.125 mg Oral BID WC  . feeding supplement (ENSURE ENLIVE)  237 mL Oral BID BM  . insulin aspart  0-9 Units Subcutaneous TID WC  . QUEtiapine  25 mg Oral TID  . venlafaxine XR  37.5 mg Oral Q breakfast    Continuous Infusions: . sodium chloride 50 mL/hr at 04/02/17 0301  . thiamine injection Stopped (04/01/17 2323)  . valproate sodium Stopped (04/02/17 0402)    PRN Meds: acetaminophen **OR** acetaminophen, ALPRAZolam, antiseptic oral rinse, lidocaine-prilocaine, polyvinyl alcohol, RESOURCE THICKENUP CLEAR  Physical Exam  Constitutional:  cachetic  Cardiovascular: Normal rate, regular rhythm and intact distal pulses.  Pulmonary/Chest:  Effort normal.  Neurological:  Arouses to voice, nonverbal, calm            Vital Signs: BP (!) 142/87 (BP Location: Right Leg)   Pulse (!) 107   Temp 97.7 F (36.5 C) (Oral)   Resp 20   Ht 5' 7"  (1.702 m)   Wt 54 kg (119 lb 0.8 oz)   SpO2 99%   BMI 18.65 kg/m  SpO2: SpO2: 99 % O2 Device: O2 Device: Not Delivered O2 Flow Rate:    Intake/output summary:   Intake/Output Summary (Last 24 hours) at 04/02/2017 1018 Last data filed  at 04/02/2017 0600 Gross per 24 hour  Intake 1010 ml  Output 2804 ml  Net -1794 ml   LBM: Last BM Date: 03/30/17 Baseline Weight: Weight: 54.4 kg (120 lb) Most recent weight: Weight: 54 kg (119 lb 0.8 oz)       Palliative Assessment/Data: PPS: 30%   Flowsheet Rows     Most Recent Value  Intake Tab  Referral Department  Hospitalist  Unit at Time of Referral  Med/Surg Unit  Palliative Care Primary Diagnosis  Nephrology  Date Notified  03/22/17  Palliative Care Type  New Palliative care  Reason for referral  Clarify Goals of Care  Date of Admission  03/19/17  Date first seen by Palliative Care  03/22/17  # of days Palliative referral response time  0 Day(s)  # of days IP prior to Palliative referral  3  Clinical Assessment  Psychosocial & Spiritual Assessment  Palliative Care Outcomes      Patient Active Problem List   Diagnosis Date Noted  . Failure to thrive in adult   . Protein-calorie malnutrition, severe 03/26/2017  . Leukocytosis   . Tobacco abuse   . History of prostate cancer   . Benign prostatic hyperplasia   . Advance care planning   . Goals of care, counseling/discussion   . Palliative care by specialist   . Altered mental status   . Acute CVA (cerebrovascular accident) ([place]) 03/19/2017  . DM (diabetes mellitus), type 2 with renal complications ([place]) 76/81/1572  . Acute encephalopathy 03/10/2017  . Subdural hematoma ([place]) 03/10/2017  . Dilated cardiomyopathy ([place]) 01/07/2017  . Acute respiratory failure with hypoxia  ([place]) 12/22/2016  . Acute on chronic combined systolic and diastolic CHF (congestive heart failure) ([place]) 12/22/2016  . HLD (hyperlipidemia) 12/22/2016  . Elevated troponin I level   . Acute blood loss anemia   . Fall 10/22/2016  . GIB (gastrointestinal bleeding) 10/21/2016  . Abnormal echocardiogram 08/06/2016  . Flash pulmonary edema ([place]) 07/06/2016  . SOB (shortness of breath) 07/06/2016  . Hypertensive urgency 07/06/2016  . Anemia in chronic kidney disease, on chronic dialysis ([place])   . Anemia due to chronic kidney disease   . Diabetic retinopathy ([place]) 01/16/2016  . Dysphagia 12/21/2015  . Environmental allergies 12/04/2015  . Allergic rhinitis 10/28/2015  . ESRD (end stage renal disease) ([place]) 10/28/2015  . Stroke ([place]) 04/06/2015  . AVM (arteriovenous malformation) of small bowel, acquired with hemorrhage ([place]) 04/06/2015  . Malnutrition of moderate degree 04/05/2015  . Acute renal failure superimposed on stage 4 chronic kidney disease ([place]) 04/04/2015  . Symptomatic anemia 04/03/2015  . Benign paroxysmal positional vertigo 09/26/2014  . Weakness of right lower extremity 02/28/2014  . Metatarsalgia of both feet 09/13/2013  . Equinus deformity of foot, acquired 09/13/2013  . Diabetes mellitus type 2, controlled, with complications ([place]) 62/05/5595  . GERD (gastroesophageal reflux disease) 03/24/2013  . GI bleed 02/28/2013  . Cerebral infarction ([place]) 11/17/2012  . Dizziness and giddiness 10/31/2012  . Prostate cancer ([place]) 10/27/2012  . Diabetic neuropathy ([place]) 10/04/2012  . CAD (coronary artery disease) 10/04/2012  . Hoarseness 06/26/2012  . Hematuria 05/24/2012  . Tinea pedis 05/24/2012  . Back pain 02/23/2012  . Erectile dysfunction 02/03/2012  . Type 2 diabetes, uncontrolled, with neuropathy ([place]) 02/01/2012  . Essential hypertension 02/01/2012  . Tobacco use 02/01/2012  . Hyperlipidemia 02/01/2012  . Anemia of renal disease 05/14/2011    Palliative Care  Assessment & Plan   Patient Profile: 72 y.o. male  with  past medical history of ESRD on dialysis, dementia, HTN, recent fall with facial fractures in November, recent hospitalization forh subdural hematoma, recent admission for encephalopathy, now admitted on 03/19/2017 with altered mental status and incidental finding on CT of cerebellar infarct.  During this admission patient was initially combative and agitated, requiring lorazepam. He is now lethargic, nonverbal, refusing medication, not able to eat or participate in therapies. When not sedated, patient remains combative and confused. He is eating again, however declining medication and participation in therapies. Requiring sedation for dialysis. Palliative medicine consulted for [place] discussion with family.     Assessment/Recommendations/Plan   Left message for family to follow up from discussion yesterday per their request and continue [place] discussion  Recommend outpatient Palliative followup once patient is discharged  Goals of Care and Additional Recommendations:  Limitations on Scope of Treatment: Full Scope Treatment  Code Status:  DNR  Prognosis:   Unable to determine  Discharge Planning:  To Be Determined - likely LTAC  Care plan was discussed with Kathlee Nations, Social Work, Dr. Verlon Au.  Thank you for allowing the Palliative Medicine Team to assist in the care of this patient.   Time In: 0945 1200 Time Out: 1030 1300 Total Time 105 mins Prolonged Time Billed Yes      Greater than 50%  of this time was spent counseling and coordinating care related to the above assessment and plan.  Mariana Kaufman, AGNP-C Palliative Medicine   Please contact Palliative Medicine Team phone at [phone] for questions and concerns.   Addendum: Met with patient's son Mycah Formica. Gave him room to vent frustrations related to his father's care and disposition options. Koltin had questions re: appealing discharge- directed him to discuss with case  manager.   Reviewed reasons that patient cannot go to SNF (cannot have outpatient dialysis d/t requriing extreme supervision), reviewed overall illness trajectory from before hospitalization to now, discussed Kapena's overall expectations for recovery- he hopes that father can return to walking. Discussed with Eivan that while I too am hopeful for him, my worry, is that we are in a different place- his father's overall cognition state is not allowing him to participate in the physical therapy, or to get up and walk. However, he will receive ongoing PT with that goal in mind at [facility] while being able to continue inpt dialysis. Recommended oupatient Palliative continue to follow patient at [facility] and [facility] was agreeable. During my visit spoke with Guerry Minors, and [name] on speakerphone- all deferred decision making to [place]- at close of conversation they agreed to [facility] placement.

## 2017-04-02 NOTE — Progress Notes (Signed)
Patient discharging to [facility], report called to [facility]. [facility] transporting to [facility] patient discharge paper work sent by [facility]. Family made aware of plan of care.  Vern Prestia, Tivis Ringer, RN

## 2017-04-02 NOTE — Consult Note (Signed)
   [facility] CM Inpatient Consult   04/02/2017  KRISHAN MCBREEN 1946-01-03 [id number]  Follow up chart review for updates: Patient is to be transferred to [facility]. Palliative Care noted reviewed as well. Family to continue with dialysis, palliative care follow up was recommended. No [facility] follow up at this time. Will sign off.  Natividad Brood, RN BSN [facility] Liaison  [phone] business mobile phone Toll free office [phone]

## 2017-04-02 NOTE — Progress Notes (Signed)
Patient going to room 323 at [facility]. CM notified RN to so she could call report. CM called [facility] and set up transportation for 4:30-5pm per bedside RN request. CM called Mrs Flanery and notified her of the room number at [facility] and of time for transport. She is going to try and meet him over at [place].

## 2017-04-02 NOTE — Progress Notes (Signed)
  [facility] Progress Note   Assessment/ Plan:    1. ESRD MWF [facility]: intermittently successful with cannulation due to aggressive behaviors.  Unable to dialyze 1/14 but able to do so 1/16 with three people involved in cannulation (one cannulating and 2 stabilizing pts arm).  I had a long talk with pt's sister and son at bedside 1/15.  It is their wish to try everything possible to safely dialyze.  Son even asked about restraining pts in dialysis which we do not do.  Will try to apply EMLA cream and possibly give a very small dose of Xanax prior to HD (0.25 PO prn prior to dialysis).  If pt can take the seroquel, that may help matters as well.  At present, if we cannot alleviate these behaviors which make dialysis dangerous to the pt and the staff he will no longer be a candidate for an outpatient setting.  It is not feasible to require 3 people to cannulate pt in the outpatient setting either.  [place]- neurology following.  To have EEG when possible.  3. Dementia with behavioral disturbances:  Neurology and psychiatry following Seroquel and depakote prescribed but depakote has been d/c'd per family requrest  72. Progressive decline/FTT--> PEG requested with IR, not able to be performed due to combativeness.  Holding off for a couple of days. On Marinol  5. Anemia- Hgb 10.9, no ESA  6. BMD: no real intake, no binders or calcitriol  7. Nutrition: as above in #4  8. Dispo: pending.  Subjective:    Sleeping today.  Trying to find appropriate LTAC for d/c.   Objective:   BP (!) 142/87 (BP Location: Right Leg)   Pulse (!) 107   Temp 97.7 F (36.5 C) (Oral)   Resp 20   Ht 5\' 7"  (1.702 m)   Wt 54 kg (119 lb 0.8 oz)   SpO2 99%   BMI 18.65 kg/m   Physical Exam: Gen: older gentleman, NAD. Cachectic.  Sleeping CVS: RRR  Resp: unlabored and clear Abd: nontender nondistended Ext: no LE edema ACCESS: L RC fistula + T/B  Labs: BMET Recent  Labs  Lab 03/27/17 0745 03/31/17 1510 04/01/17 0857  NA 141 144 144  K 4.3 5.3* 4.5  CL 103 106 110  CO2 20* 21* 18*  GLUCOSE 118* 131* 99  BUN 71* 91* 99*  CREATININE 10.17* 11.98* 12.74*  CALCIUM 8.5* 8.4* 8.0*  PHOS 7.6* 7.0* 7.4*   CBC Recent Labs  Lab 03/27/17 0745 03/31/17 0851 04/01/17 0858  WBC 8.1 6.2 5.8  HGB 10.9* 12.6* 11.5*  HCT 33.4* 37.5* 34.7*  MCV 94.4 92.6 92.0  PLT 167 253 276    @IMGRELPRIORS @ Medications:    . aspirin  300 mg Rectal Daily   Or  . aspirin  325 mg Oral Daily  . atorvastatin  80 mg Oral q1800  . carvedilol  3.125 mg Oral BID WC  . feeding supplement (ENSURE ENLIVE)  237 mL Oral BID BM  . insulin aspart  0-9 Units Subcutaneous TID WC  . QUEtiapine  25 mg Oral TID  . venlafaxine XR  37.5 mg Oral Q breakfast     Madelon Lips MD [facility] pgr [phone] 04/02/2017, 1:27 PM

## 2017-04-02 NOTE — Progress Notes (Addendum)
Pt being discharged today. Dr Verlon Au notified son and CM spoke with him over the phone. Pt wanting clarification on why [facility] was not an option and if patient now able to sit in chair for dialysis. Son to be at the hospital later in day.  CM called PT to make sure they saw and worked with him today. PT states they have worked with him and he would not cooperate and was aggressively swinging at the therapist.  Dr Verlon Au called and spoke to patients wife. CM was then able to speak with her about why [facility] was not an option (no dialysis bed, not meeting criteria, not offering a bed). Wife voiced understanding and asked to move forward with [facility].  Mrs Nasworthy called CM back and asked CM to call and speak with patient's son. CM was able to meet with him, CSW and palliative care in the room. Son reluctant but agreeable to [facility] at this time. He asked to speak with [facility] rep. CM informed Loury and he will reach out to the son.  CM received phone call from Mrs Helle. She asked to have number for [facility] with [facility]. CM provided the number and also let Loury know to reach out to Mrs Winchell.  Plan is for d/c to [facility] later today. CM following.

## 2017-04-02 NOTE — Progress Notes (Addendum)
Subjective: Palliative care saw the patient yesterday.   Objective: Current vital signs: BP (!) 142/87 (BP Location: Right Leg)   Pulse (!) 107   Temp 97.7 F (36.5 C) (Oral)   Resp 20   Ht _0  (1.702 m)   Wt 54 kg (119 lb 0.8 oz)   SpO2 99%   BMI 18.65 kg/m  Vital signs in last 24 hours: Temp:  [97.7 F (36.5 C)-98.7 F (37.1 C)] 97.7 F (36.5 C) (01/17 0213) Pulse Rate:  [99-120] 107 (01/17 0213) Resp:  [14-20] 20 (01/17 0213) BP: (84-142)/(57-91) 142/87 (01/17 0213) SpO2:  [99 %-100 %] 99 % (01/17 0213) Weight:  [54 kg (119 lb 0.8 oz)] 54 kg (119 lb 0.8 oz) (01/16 1240)  Intake/Output from previous day: 01/16 0701 - 01/17 0700 In: 1010 [P.O.:600; I.V.:300; IV Piggyback:110] Out: 2804 [Urine:300] Intake/Output this shift: No intake/output data recorded. Nutritional status: DIET - DYS 1 Room service appropriate? Yes; Fluid consistency: Nectar Thick  Neurologic Exam: Ment: Awake and alert. Noncooperative. States "What the hell do you want" when interview is attempted. Subsequently, does not answer any questions or follow any commands.  CN: Gazes to left and right while tracking examiner. No nystagmus noted. No facial droop noted.  Motor: No jerking, twitching, tremor or other adventitious movements noted.   Lab Results: Results for orders placed or performed during the hospital encounter of 03/19/17 (from the past 48 hour(s))  Glucose, capillary     Status: Abnormal   Collection Time: 03/31/17 11:31 AM  Result Value Ref Range   Glucose-Capillary 129 (H) 65 - 99 mg/dL  Renal function panel     Status: Abnormal   Collection Time: 03/31/17  3:10 PM  Result Value Ref Range   Sodium 144 135 - 145 mmol/L   Potassium 5.3 (H) 3.5 - 5.1 mmol/L   Chloride 106 101 - 111 mmol/L   CO2 21 (L) 22 - 32 mmol/L   Glucose, Bld 131 (H) 65 - 99 mg/dL   BUN 91 (H) 6 - 20 mg/dL   Creatinine, Ser 11.98 (H) 0.61 - 1.24 mg/dL   Calcium 8.4 (L) 8.9 - 10.3 mg/dL   Phosphorus 7.0 (H) 2.5  - 4.6 mg/dL   Albumin 2.6 (L) 3.5 - 5.0 g/dL   GFR calc non Af Amer 4 (L) >60 mL/min   GFR calc Af Amer 4 (L) >60 mL/min    Comment: (NOTE) The eGFR has been calculated using the CKD EPI equation. This calculation has not been validated in all clinical situations. eGFR's persistently <60 mL/min signify possible Chronic Kidney Disease.    Anion gap 17 (H) 5 - 15  Glucose, capillary     Status: Abnormal   Collection Time: 03/31/17  5:27 PM  Result Value Ref Range   Glucose-Capillary 126 (H) 65 - 99 mg/dL  Glucose, capillary     Status: Abnormal   Collection Time: 03/31/17  9:09 PM  Result Value Ref Range   Glucose-Capillary 142 (H) 65 - 99 mg/dL   Comment 1 Notify RN    Comment 2 Document in Chart   Glucose, capillary     Status: None   Collection Time: 04/01/17  6:10 AM  Result Value Ref Range   Glucose-Capillary 88 65 - 99 mg/dL   Comment 1 Notify RN    Comment 2 Document in Chart   Renal function panel     Status: Abnormal   Collection Time: 04/01/17  8:57 AM  Result Value Ref Range  Sodium 144 135 - 145 mmol/L   Potassium 4.5 3.5 - 5.1 mmol/L   Chloride 110 101 - 111 mmol/L   CO2 18 (L) 22 - 32 mmol/L   Glucose, Bld 99 65 - 99 mg/dL   BUN 99 (H) 6 - 20 mg/dL   Creatinine, Ser 12.74 (H) 0.61 - 1.24 mg/dL   Calcium 8.0 (L) 8.9 - 10.3 mg/dL   Phosphorus 7.4 (H) 2.5 - 4.6 mg/dL   Albumin 2.6 (L) 3.5 - 5.0 g/dL   GFR calc non Af Amer 3 (L) >60 mL/min   GFR calc Af Amer 4 (L) >60 mL/min    Comment: (NOTE) The eGFR has been calculated using the CKD EPI equation. This calculation has not been validated in all clinical situations. eGFR's persistently <60 mL/min signify possible Chronic Kidney Disease.    Anion gap 16 (H) 5 - 15  CBC     Status: Abnormal   Collection Time: 04/01/17  8:58 AM  Result Value Ref Range   WBC 5.8 4.0 - 10.5 K/uL   RBC 3.77 (L) 4.22 - 5.81 MIL/uL   Hemoglobin 11.5 (L) 13.0 - 17.0 g/dL   HCT 34.7 (L) 39.0 - 52.0 %   MCV 92.0 78.0 - 100.0 fL    MCH 30.5 26.0 - 34.0 pg   MCHC 33.1 30.0 - 36.0 g/dL   RDW 16.9 (H) 11.5 - 15.5 %   Platelets 276 150 - 400 K/uL  Glucose, capillary     Status: Abnormal   Collection Time: 04/01/17 10:19 AM  Result Value Ref Range   Glucose-Capillary 105 (H) 65 - 99 mg/dL  Glucose, capillary     Status: None   Collection Time: 04/01/17  2:12 PM  Result Value Ref Range   Glucose-Capillary 90 65 - 99 mg/dL  Glucose, capillary     Status: Abnormal   Collection Time: 04/01/17  4:51 PM  Result Value Ref Range   Glucose-Capillary 194 (H) 65 - 99 mg/dL  Glucose, capillary     Status: Abnormal   Collection Time: 04/01/17 10:30 PM  Result Value Ref Range   Glucose-Capillary 221 (H) 65 - 99 mg/dL  Glucose, capillary     Status: Abnormal   Collection Time: 04/02/17  6:12 AM  Result Value Ref Range   Glucose-Capillary 133 (H) 65 - 99 mg/dL    No results found for this or any previous visit (from the past 240 hour(s)).  Lipid Panel No results for input(s): CHOL, TRIG, HDL, CHOLHDL, VLDL, LDLCALC in the last 72 hours.  Studies/Results: No results found.  Medications:  Scheduled: . aspirin  300 mg Rectal Daily   Or  . aspirin  325 mg Oral Daily  . atorvastatin  80 mg Oral q1800  . carvedilol  3.125 mg Oral BID WC  . feeding supplement (ENSURE ENLIVE)  237 mL Oral BID BM  . insulin aspart  0-9 Units Subcutaneous TID WC  . QUEtiapine  25 mg Oral TID  . venlafaxine XR  37.5 mg Oral Q breakfast   Continuous: . sodium chloride 50 mL/hr at 04/02/17 0301  . thiamine injection Stopped (04/01/17 2323)  . valproate sodium Stopped (04/02/17 0402)   OIZ:[id number] **OR** acetaminophen, ALPRAZolam, antiseptic oral rinse, lidocaine-prilocaine, polyvinyl alcohol, RESOURCE THICKENUP CLEAR  EEG 04/01/17: Description: The patient is described as being very agitated and uncooperative. The predominant feature of this tracing is diffuse slowing and disorganization that persists throughout the tracing.  A 5-6  Hz theta rhythm is the predominant  posterior rhythm, and intermittent frontal delta (FIRDA) of 1-2 Hz frequency is noted.  No well defined drowsiness or sleep is appreciated.  Neither hyperventilation or photic stimulation were performed.  EKG was monitored and noted to be sinus tachycardia with an average heart rate of 108 bpm.  No epileptiform changes were noted. Impression: This is an abnormal EEG due to diffuse background slowing and disorganization seen throughout the tracing.  This is a non-specific finding that can be seen with toxic, metabolic, diffuse, or multifocal structural processes.  No definite epileptiform changes were noted.   A single EEG without epileptiform changes does not exclude the diagnosis of epilepsy. Clinical correlation advised.   Impression: 72 year old male withpresenting withacute altered mental status in the setting of vascular dementia.The cerebellar infarct is not adequate to explain his overall confusion. He has had progressive failure to thrive, but the abrupt change over the past week does suggeststhere may be some chance of recovery to the level of function that he was experiencing in between his December and January hospitalizations. Also, he has had significant weight loss, one consideration could be thiamine deficiency as a contributor.  1. Most likelythis is primarily amultifactorial delirium in the setting of failure to thrive and underlying dementia. If family wishes to pursue G-tube, it is possible that he could have slow continued improvement towards baseline. This is not definite, however, andDr. [name] hasdiscussed that with the son. 2.EEG negative for epileptiform discharges. Diffuse non-specific slowing consistent with toxic, metabolic, diffuse, or multifocal structural processes is noted. Most likely the diffuse slowing is secondary to the patient's dementia.   Recommendations: 1. Agree with continued discussions with palliative  care 2.Continue Depakote 500 mg BID. 3. There has been no improvement with high dose IV thiamine protocol. Can switch back to PO thiamine at 100 mg per day after full 3 day course has been completed.  4. Neurology will sign off. Please call the Neurohospitalist team if there are additional questions.    LOS: 14 days   _0  signed: Dr. Kerney Elbe 04/02/2017  9:32 AM

## 2017-04-03 ENCOUNTER — Encounter: Payer: Self-pay | Admitting: Nephrology

## 2017-04-03 ENCOUNTER — Telehealth: Payer: Self-pay

## 2017-04-03 NOTE — Telephone Encounter (Signed)
04/03/17   TCM Hospital follow Up  Called patient left message for return call with wife regarding hospital follow up visit.

## 2017-04-06 ENCOUNTER — Telehealth: Payer: Self-pay | Admitting: *Deleted

## 2017-04-06 NOTE — Telephone Encounter (Signed)
Received Home Health Plan of Care Update; forwarded to provider/SLS 01/21

## 2017-05-12 ENCOUNTER — Ambulatory Visit: Payer: Medicare Other | Admitting: Neurology

## 2017-05-29 MED ORDER — GENERIC EXTERNAL MEDICATION
75.00 | Status: DC
Start: 2017-05-30 — End: 2017-05-29

## 2017-05-29 MED ORDER — PROMETHAZINE HCL 25 MG PO TABS
25.00 | ORAL_TABLET | ORAL | Status: DC
Start: ? — End: 2017-05-29

## 2017-05-29 MED ORDER — LORAZEPAM 0.5 MG PO TABS
0.25 | ORAL_TABLET | ORAL | Status: DC
Start: 2017-06-01 — End: 2017-05-29

## 2017-05-29 MED ORDER — GENERIC EXTERNAL MEDICATION
1.00 | Status: DC
Start: ? — End: 2017-05-29

## 2017-05-29 MED ORDER — AMLODIPINE BESYLATE 5 MG PO TABS
10.00 | ORAL_TABLET | ORAL | Status: DC
Start: 2017-05-30 — End: 2017-05-29

## 2017-05-29 MED ORDER — GABAPENTIN 100 MG PO CAPS
100.00 | ORAL_CAPSULE | ORAL | Status: DC
Start: 2017-05-29 — End: 2017-05-29

## 2017-05-29 MED ORDER — OXYCODONE-ACETAMINOPHEN 5-325 MG PO TABS
2.00 | ORAL_TABLET | ORAL | Status: DC
Start: ? — End: 2017-05-29

## 2017-05-29 MED ORDER — ACETAMINOPHEN 325 MG PO TABS
650.00 | ORAL_TABLET | ORAL | Status: DC
Start: ? — End: 2017-05-29

## 2017-05-29 MED ORDER — DEXTROSE 50 % IV SOLN
12.00 g | INTRAVENOUS | Status: DC
Start: ? — End: 2017-05-29

## 2017-05-29 MED ORDER — SODIUM CHLORIDE 0.9 % IJ SOLN
10.00 | INTRAMUSCULAR | Status: DC
Start: 2017-05-29 — End: 2017-05-29

## 2017-05-29 MED ORDER — INSULIN LISPRO 100 UNIT/ML ~~LOC~~ SOLN
2.00 | SUBCUTANEOUS | Status: DC
Start: 2017-05-30 — End: 2017-05-29

## 2017-05-29 MED ORDER — GENERIC EXTERNAL MEDICATION
5.00 | Status: DC
Start: 2017-05-30 — End: 2017-05-29

## 2017-05-29 MED ORDER — SODIUM CHLORIDE 0.9 % IJ SOLN
10.00 | INTRAMUSCULAR | Status: DC
Start: ? — End: 2017-05-29

## 2017-05-29 MED ORDER — ALUMINUM-MAGNESIUM-SIMETHICONE 200-200-20 MG/5ML PO SUSP
30.00 | ORAL | Status: DC
Start: ? — End: 2017-05-29

## 2017-05-29 MED ORDER — POLYETHYLENE GLYCOL 3350 17 G PO PACK
17.00 g | PACK | ORAL | Status: DC
Start: 2017-05-30 — End: 2017-05-29

## 2017-05-29 MED ORDER — CARVEDILOL 3.125 MG PO TABS
3.13 | ORAL_TABLET | ORAL | Status: DC
Start: 2017-05-29 — End: 2017-05-29

## 2017-05-29 MED ORDER — PANTOPRAZOLE SODIUM 40 MG IV SOLR
40.00 | INTRAVENOUS | Status: DC
Start: 2017-05-29 — End: 2017-05-29

## 2017-05-29 MED ORDER — ATORVASTATIN CALCIUM 40 MG PO TABS
80.00 | ORAL_TABLET | ORAL | Status: DC
Start: 2017-05-29 — End: 2017-05-29

## 2017-05-29 MED ORDER — ONDANSETRON HCL 4 MG/2ML IJ SOLN
4.00 | INTRAMUSCULAR | Status: DC
Start: ? — End: 2017-05-29

## 2017-05-29 MED ORDER — GLUCOSE 40 % PO GEL
15.00 g | ORAL | Status: DC
Start: ? — End: 2017-05-29

## 2017-06-08 ENCOUNTER — Telehealth: Payer: Self-pay | Admitting: *Deleted

## 2017-06-08 NOTE — Telephone Encounter (Signed)
Received Physician Orders/Plan of Care from [facility] [via [facility], sent to wrong office]; forwarded to provider/SLS 03/25

## 2017-07-01 ENCOUNTER — Telehealth: Payer: Self-pay | Admitting: Neurology

## 2017-07-01 NOTE — Telephone Encounter (Signed)
Dr. Erlinda Hong saw him in January with an acute stroke. He will need a follow up appointment. Appropriate to place on my schedule. Thank you!

## 2017-07-01 NOTE — Telephone Encounter (Signed)
This is appropriate for NP. Janett Billow, NP is aware of this upcoming appt.

## 2017-07-01 NOTE — Telephone Encounter (Signed)
Amy/[facility] called to schedule appt for the pt for dementia on 4/23. The pt last saw Dr Leonie Man on 01/07/17. Is this appropriate for NP?

## 2017-07-07 ENCOUNTER — Ambulatory Visit (INDEPENDENT_AMBULATORY_CARE_PROVIDER_SITE_OTHER): Payer: Medicare Other | Admitting: Adult Health

## 2017-07-07 ENCOUNTER — Encounter: Payer: Self-pay | Admitting: Adult Health

## 2017-07-07 VITALS — BP 130/71 | HR 79 | Ht 69.0 in

## 2017-07-07 DIAGNOSIS — I10 Essential (primary) hypertension: Secondary | ICD-10-CM | POA: Diagnosis not present

## 2017-07-07 DIAGNOSIS — E785 Hyperlipidemia, unspecified: Secondary | ICD-10-CM | POA: Diagnosis not present

## 2017-07-07 DIAGNOSIS — E1159 Type 2 diabetes mellitus with other circulatory complications: Secondary | ICD-10-CM | POA: Diagnosis not present

## 2017-07-07 DIAGNOSIS — R413 Other amnesia: Secondary | ICD-10-CM | POA: Diagnosis not present

## 2017-07-07 DIAGNOSIS — Z8673 Personal history of transient ischemic attack (TIA), and cerebral infarction without residual deficits: Secondary | ICD-10-CM | POA: Diagnosis not present

## 2017-07-07 MED ORDER — ASPIRIN EC 325 MG PO TBEC: 325.0000 mg | DELAYED_RELEASE_TABLET | Freq: Every day | ORAL | 0 refills | Status: DC

## 2017-07-07 MED ORDER — GABAPENTIN 100 MG PO CAPS: 100.0000 mg | ORAL_CAPSULE | Freq: Every day | ORAL | 0 refills | Status: DC | PRN

## 2017-07-07 MED ORDER — GABAPENTIN 300 MG PO CAPS: 300.0000 mg | ORAL_CAPSULE | Freq: Two times a day (BID) | ORAL | 11 refills | Status: DC

## 2017-07-07 MED ORDER — DONEPEZIL HCL 5 MG PO TABS: 5.0000 mg | ORAL_TABLET | Freq: Every day | ORAL | 0 refills | Status: DC
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
## 2017-07-07 NOTE — Patient Instructions (Signed)
start aspirin 325 mg daily  and lipitor  for secondary stroke prevention  Start aricept 5mg  - if tolerating okay after 1 month, we can increase this to 10mg  daily  Increase gabapentin from 100mg  three times a day to 300mg  twice a day and 1 dose of 100mg  after HD only  continue with PT/OT/ST  Maintain strict control of hypertension with blood pressure goal below 130/90, diabetes with hemoglobin A1c goal below 6.5% and cholesterol with LDL cholesterol (bad cholesterol) goal below 70 mg/dL. I also advised the patient to eat a healthy diet with plenty of whole grains, cereals, fruits and vegetables, exercise regularly and maintain ideal body weight.  Followup in the future with me in 1 month or call earlier if needed

## 2017-07-10 NOTE — Progress Notes (Signed)
I agree with the above plan 

## 2017-07-13 ENCOUNTER — Inpatient Hospital Stay: Payer: Medicare Other | Admitting: Medical

## 2017-07-13 ENCOUNTER — Telehealth: Payer: Self-pay

## 2017-07-13 DIAGNOSIS — Z0289 Encounter for other administrative examinations: Secondary | ICD-10-CM

## 2017-07-13 NOTE — Telephone Encounter (Signed)
Ok verbal order for OT.Will you call and update them.

## 2017-07-13 NOTE — Telephone Encounter (Signed)
Copied from [place] [phone]. Topic: Inquiry >> Jul 13, 2017  4:03 PM Oliver Pila B wrote: Reason for CRM: [facility]  called for verbal orders for OT call [phone]

## 2017-07-14 ENCOUNTER — Encounter: Payer: Self-pay | Admitting: Medical

## 2017-07-15 NOTE — Telephone Encounter (Signed)
Order given to [facility]

## 2017-07-16 ENCOUNTER — Ambulatory Visit (INDEPENDENT_AMBULATORY_CARE_PROVIDER_SITE_OTHER): Payer: Medicare Other | Admitting: Medical

## 2017-07-16 ENCOUNTER — Telehealth: Payer: Self-pay | Admitting: Emergency Medicine

## 2017-07-16 ENCOUNTER — Ambulatory Visit (HOSPITAL_BASED_OUTPATIENT_CLINIC_OR_DEPARTMENT_OTHER)
Admission: RE | Admit: 2017-07-16 | Discharge: 2017-07-16 | Disposition: A | Discharge status: Home / Self Care | Payer: Medicare Other | Source: Ambulatory Visit | Attending: Medical | Admitting: Medical

## 2017-07-16 ENCOUNTER — Encounter: Payer: Self-pay | Admitting: Medical

## 2017-07-16 ENCOUNTER — Ambulatory Visit: Payer: Medicare Other | Admitting: Medical

## 2017-07-16 VITALS — BP 105/41 | HR 80 | Temp 97.7°F | Resp 16 | Ht 69.0 in

## 2017-07-16 DIAGNOSIS — N186 End stage renal disease: Secondary | ICD-10-CM

## 2017-07-16 DIAGNOSIS — R1031 Right lower quadrant pain: Secondary | ICD-10-CM | POA: Diagnosis not present

## 2017-07-16 DIAGNOSIS — M25551 Pain in right hip: Secondary | ICD-10-CM | POA: Insufficient documentation

## 2017-07-16 DIAGNOSIS — E785 Hyperlipidemia, unspecified: Secondary | ICD-10-CM

## 2017-07-16 DIAGNOSIS — I10 Essential (primary) hypertension: Secondary | ICD-10-CM | POA: Diagnosis not present

## 2017-07-16 LAB — CBC WITH DIFFERENTIAL/PLATELET
BASOS PCT: 1.2 % (ref 0.0–3.0)
Basophils Absolute: 0.1 10*3/uL (ref 0.0–0.1)
EOS ABS: 0.1 10*3/uL (ref 0.0–0.7)
Eosinophils Relative: 2.1 % (ref 0.0–5.0)
LYMPHS PCT: 21.2 % (ref 12.0–46.0)
Lymphs Abs: 1.3 10*3/uL (ref 0.7–4.0)
MCHC: 34.6 g/dL (ref 30.0–36.0)
MCV: 101.5 fl — ABNORMAL HIGH (ref 78.0–100.0)
Monocytes Absolute: 0.7 10*3/uL (ref 0.1–1.0)
Monocytes Relative: 12.2 % — ABNORMAL HIGH (ref 3.0–12.0)
Neutro Abs: 3.8 10*3/uL (ref 1.4–7.7)
Neutrophils Relative %: 63.3 % (ref 43.0–77.0)
Platelets: 331 10*3/uL (ref 150.0–400.0)
RBC: 1.73 Mil/uL — ABNORMAL LOW (ref 4.22–5.81)
RDW: 20.6 % — AB (ref 11.5–15.5)
WBC: 6 10*3/uL (ref 4.0–10.5)

## 2017-07-16 MED ORDER — CARVEDILOL 3.125 MG PO TABS: 3.1250 mg | ORAL_TABLET | Freq: Two times a day (BID) | ORAL | 11 refills | Status: DC

## 2017-07-16 MED ORDER — DONEPEZIL HCL 5 MG PO TABS: 5.0000 mg | ORAL_TABLET | Freq: Every day | ORAL | 11 refills | Status: DC

## 2017-07-16 MED ORDER — AMLODIPINE BESYLATE 10 MG PO TABS: ORAL_TABLET | ORAL | 3 refills | Status: DC

## 2017-07-16 MED ORDER — DONEPEZIL HCL 5 MG PO TABS: 5.0000 mg | ORAL_TABLET | Freq: Every day | ORAL | 0 refills | Status: DC

## 2017-07-16 MED ORDER — CLOPIDOGREL BISULFATE 75 MG PO TABS: ORAL_TABLET | ORAL | 11 refills | Status: DC

## 2017-07-16 MED ORDER — SERTRALINE HCL 25 MG PO TABS: ORAL_TABLET | ORAL | 11 refills | Status: DC

## 2017-07-16 MED ORDER — ATORVASTATIN CALCIUM 80 MG PO TABS
80.0000 mg | ORAL_TABLET | Freq: Every day | ORAL | 11 refills | Status: DC
Start: 2017-07-16 — End: 2017-10-15

## 2017-07-16 MED ORDER — FAMOTIDINE 20 MG PO TABS: ORAL_TABLET | ORAL | 11 refills | Status: DC

## 2017-07-16 MED ORDER — TRAMADOL HCL 50 MG PO TABS: 50.0000 mg | ORAL_TABLET | Freq: Two times a day (BID) | ORAL | 0 refills | Status: DC

## 2017-07-16 NOTE — Progress Notes (Signed)
Subjective:    Patient ID: [name], male    DOB: 19-Oct-1945, 72 y.o.   MRN: [id number]  HPI  Pt in with complaint of some rt inguinal region pain for about 1-2 month. Recent more severe pain last 3 days pain worse. Wife states it was hurting in nursing home.  Pt was in nursing home post stroke. Pt had some PT but therapy limited by his pain in rt groin area. They did discharge him just recently. Pt 3 weeks ago had xray of rt hip and told had arthritis. He was told to take tylenol. But did not help.  Pt has had follow up with neurologist following stroke and AMS. Note reviewed.  Plan summary:  -start aspirin 325 mg daily  and lipitor  for secondary stroke prevention -Start aricept 5mg  - if tolerating okay after 1 month, we can increase this to 10mg  daily -Increase gabapentin from 100mg  three times a day to 300mg  twice a day and 1 dose of 100mg  after HD only -continue with PT/OT/ST -Maintain strict control of hypertension with blood pressure goal below 130/90, diabetes with hemoglobin A1c goal below 6.5% and cholesterol with LDL cholesterol (bad cholesterol) goal below 70 mg/dL. I also advised the patient to eat a healthy diet with plenty of whole grains, cereals, fruits and vegetables, exercise regularly and maintain ideal body weight.  Followup in the future with me in 1 month or call earlier if needed  The gabapentin is not helping the pain. Wife and patient want additional med to help with pain.   Review of Systems  Constitutional: Negative for chills, fatigue and fever.  Respiratory: Negative for cough, chest tightness, shortness of breath and wheezing.   Cardiovascular: Negative for chest pain and palpitations.  Gastrointestinal: Negative for abdominal pain.  Musculoskeletal:       Rt hip pain.  Skin: Negative for rash.  Neurological: Negative for dizziness, seizures, light-headedness, numbness and headaches.  Hematological: Negative for adenopathy. Does not bruise/bleed  easily.  Psychiatric/Behavioral: Negative for behavioral problems and confusion.   Past Medical History:  Diagnosis Date  . 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 Anemia, iron deficiency 03/24/2011   a. Recurrent GI bleed, tx with periodic iron infusions.  . 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 AVM (arteriovenous malformation) of duodenum, acquired    egds in 01/2012, 04/2011  . BPH (benign prostatic hyperplasia)   . CAD (coronary artery disease)    a. Cath 09/2012: moderate borderline CAD in mid LAD/small diagonal branch, mild RCA stenosis, to be managed medically   . Chronic lower back pain   . Diabetic peripheral neuropathy ([place]) 2014   foot pain.  . Essential hypertension 02/01/2012  . Fatty liver    on CT of 11/2010  . GERD (gastroesophageal reflux disease)   . GI bleed    a. Recurrent GI bleed, tx with IV iron. b. Per heme notes - likely AVMs 04/2012 (tx with cauterization several months ago).  . Hematuria    a. Urology note scan from 07/2012: cystoscopy without evidence for bladder lesion, only lateral hypertrophy of posterior urethra, bladder impression from BPH. b. Pt states he had "some tests" scheduled for later in July 2014.  . High cholesterol   . Hyperlipidemia 02/01/2012  . Hypertension   . Hypertensive urgency 07/06/2016  . 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 Orthostatic hypotension    a. Tx with florinef.  . Peripheral neuropathy   . Polyp, colonic    Colonoscopy  01/2012 "benign" polyp  . Prostate cancer ([place])   . Sleep apnea    "had mask; couldn't sleep in it" (09/20/2012)  . Smoker   . SOB (shortness of breath) 07/06/2016  . Stage III chronic kidney disease ([name])    a. Stage 3 (DM with complications ->CKD, peripheral neuropathy).  . Stroke ([place])    x 2  . Symptomatic anemia 04/03/2015  . Tinea pedis 05/24/2012  . Tobacco use 02/01/2012  . Type 2 diabetes, uncontrolled, with neuropathy ([place]) 02/01/2012  . Type II diabetes mellitus ([place])     a. Dx 1994, uncontrolled.      Social History   Socioeconomic History  . Marital status: Married    Spouse name: Not on file  . Number of children: 2  . Years of education: Not on file  . Highest education level: Not on file  Occupational History  . Occupation: taken out of work due to back  Social Needs  . Financial resource strain: Not on file  . Food insecurity:    Worry: Not on file    Inability: Not on file  . Transportation needs:    Medical: Not on file    Non-medical: Not on file  Tobacco Use  . Smoking status: Current Every Day Smoker    Packs/day: 0.50    Years: 30.00    Pack years: 15.00    Types: Cigarettes    Start date: 04/15/1968  . Smokeless tobacco: Never Used  . Tobacco comment: start back a couple of months ago  Substance and Sexual Activity  . Alcohol use: No    Alcohol/week: 0.0 oz    Comment: 09/20/2012 "Used to; stopped ~ 2009; never had problem w/it"  . Drug use: No  . Sexual activity: Never  Lifestyle  . Physical activity:    Days per week: Not on file    Minutes per session: Not on file  . Stress: Not on file  Relationships  . Social connections:    Talks on phone: Not on file    Gets together: Not on file    Attends religious service: Not on file    Active member of club or organization: Not on file    Attends meetings of clubs or organizations: Not on file    Relationship status: Not on file  . Intimate partner violence:    Fear of current or ex partner: Not on file    Emotionally abused: Not on file    Physically abused: Not on file    Forced sexual activity: Not on file  Other Topics Concern  . Not on file  Social History Narrative   Regular exercise: rides horses   Caffeine use: occasionally    Past Surgical History:  Procedure Laterality Date  . ENTEROSCOPY N/A 03/01/2013   Procedure: ENTEROSCOPY;  Surgeon: Inda Castle, MD;  Location: [facility];  Service: Endoscopy;  Laterality: N/A;  . ENTEROSCOPY N/A 06/26/2016     Procedure: ENTEROSCOPY;  Surgeon: Milus Banister, MD;  Location: [facility];  Service: Endoscopy;  Laterality: N/A;  this is a push enteroscopy  . ENTEROSCOPY N/A 10/23/2016   Procedure: ENTEROSCOPY;  Surgeon: Gatha Mayer, MD;  Location: [facility] ENDOSCOPY;  Service: Endoscopy;  Laterality: N/A;  . GIVENS CAPSULE STUDY N/A 04/05/2015   Procedure: GIVENS CAPSULE STUDY;  Surgeon: Gatha Mayer, MD;  Location: [facility];  Service: Endoscopy;  Laterality: N/A;  . INGUINAL HERNIA REPAIR Right 2011  . LEFT HEART CATH AND CORONARY  ANGIOGRAPHY N/A 12/23/2016   Procedure: LEFT HEART CATH AND CORONARY ANGIOGRAPHY;  Surgeon: Leonie Man, MD;  Location: [facility];  Service: Cardiovascular;  Laterality: N/A;  . LEFT HEART CATHETERIZATION WITH CORONARY ANGIOGRAM N/A 09/21/2012   Procedure: LEFT HEART CATHETERIZATION WITH CORONARY ANGIOGRAM;  Surgeon: Peter M Martinique, MD;  Location: [facility] C[facility];  Service: Cardiovascular;  Laterality: N/A;  . LUMBAR DISC SURGERY  1980's  . LYMPHADENECTOMY Bilateral 01/19/2013   Procedure: LYMPHADENECTOMY;  Surgeon: Bernestine Amass, MD;  Location: [facility];  Service: Urology;  Laterality: Bilateral;  . ROBOT ASSISTED LAPAROSCOPIC RADICAL PROSTATECTOMY N/A 01/19/2013   Procedure: ROBOTIC ASSISTED LAPAROSCOPIC RADICAL PROSTATECTOMY;  Surgeon: Bernestine Amass, MD;  Location: [facility];  Service: Urology;  Laterality: N/A;  . SHOULDER OPEN ROTATOR CUFF REPAIR Left 1980's    Family History  Problem Relation Age of Onset  . Stroke Father        Died at 58  . Diabetes Mother   . Heart attack Brother        Died at 42  . Diabetes Sister   . Stomach cancer Brother   . Heart attack Sister     No Known Allergies  Current Outpatient Medications on File Prior to Visit  Medication Sig Dispense Refill  . ALPRAZolam (XANAX) 0.25 MG tablet Take 1 tablet (0.25 mg total) by mouth every other day as needed for anxiety (please give 30 min prior to dialysis.  Please HOLD this  medication if pt has received any sedation/ procedures for that day). 6 tablet 0  . ARTIFICIAL TEAR SOLUTION OP Instill 1-2 drops into both eyes two to three times a day as needed for dry eyes    . ciprofloxacin (CIPRO) 250 MG tablet TK 1 T PO  ONCE DAILY  0  . gabapentin (NEURONTIN) 100 MG capsule Take 1 capsule (100 mg total) by mouth daily as needed (after HD treatment only). 30 capsule 0  . gabapentin (NEURONTIN) 300 MG capsule Take 1 capsule (300 mg total) by mouth 2 (two) times daily. 90 capsule 11  . aspirin EC 325 MG tablet Take 1 tablet (325 mg total) by mouth daily. (Patient not taking: Reported on 07/16/2017) 30 tablet 0  . Maltodextrin-Xanthan Gum (RESOURCE THICKENUP CLEAR) POWD Take 1 g by mouth as needed. (Patient not taking: Reported on 07/16/2017)    . multivitamin (RENA-VIT) TABS tablet Take 1 tablet by mouth every Monday, Wednesday, and Friday.     [name] Kitchen QUEtiapine (SEROQUEL) 25 MG tablet Take 1 tablet (25 mg total) by mouth 3 (three) times daily. (Patient not taking: Reported on 07/16/2017) 9 tablet 0  . ranitidine (ZANTAC) 150 MG capsule Take 1 capsule (150 mg total) by mouth 2 (two) times daily. (Patient not taking: Reported on 07/16/2017) 180 capsule 1  . Valproic Acid 500 MG CPDR Take 1 capsule (500 mg total) by mouth 2 (two) times daily. (Patient not taking: Reported on 07/16/2017) 8 each 0   No current facility-administered medications on file prior to visit.     BP (!) 105/41 (BP Location: Right Arm, Patient Position: Sitting, Cuff Size: Small)   Pulse 80   Temp 97.7 F (36.5 C) (Oral)   Resp 16   Ht 5\' 9"  (1.753 m)   SpO2 100%   BMI 17.58 kg/m       Objective:   Physical Exam  General Appearance- Not in acute distress. He appears have normal mental status today.  HEENT Eyes- Scleraeral/Conjuntiva-bilat- Not Yellow.  Mouth & Throat- Normal.  Chest and Lung Exam Auscultation: Breath sounds:-Normal. Adventitious sounds:- No Adventitious  sounds.  Cardiovascular Auscultation:Rythm - Regular. Heart Sounds -Normal heart sounds.  Abdomen Inspection:-Inspection Normal.  Palpation/Perucssion: Palpation and Percussion of the abdomen reveal- Non Tender, No Rebound tenderness, No rigidity(Guarding) and No Palpable abdominal masses.  Liver:-Normal.  Spleen:- Normal.  On exam no tenderness in the right lower quadrant.  Back- no cva tenderness.  Rt hip- tender to palpation. On range of motion of hip has moderate to severe pain.  Genital exam-no testicle tenderness to palpation.  Also on palpation of right inguinal canal there is no hernia that was felt.     Assessment & Plan:  For your recent right hip and groin region pain, I do want to get a x-ray today and verify that you do have arthritis.  Due to the proximity of your pain in the appendix area would like to know relatively quickly that you do have arthritis.  You had x-rays done recently at orthopedist but it may take some time to get those reports sent to [place].  Therefore getting test done today.  Also want you to get a CBC today in our lab.  Continue gabapentin for your pain.  I talked with pharmacist downstairs and he states that in most cases that is an appropriate dose.  The neurologist did go ahead and send that to [facility].  I am providing a 5-day prescription of tramadol.  We will see if this is adequate for your pain control.  If so then we need to do come back next week to sign a contract and to get drug screen testing.  Then I would be able to give you a larger volume.  If right hip region pain worsens severely after hours then recommend ED evaluation.  I did refill all your chronic medications today.  Follow-up in 3 weeks or as needed.  40 minutes spent with patient today. 50% of time spent counseling. Majority of time spent regarding hip pain. Long discussion in order to determine if hip source of pain vs origin testicle, hernia, appendix. Explaining work up that  would be done and potential expansion of work in event hip xray negative. Also discussed pain control measures and contract/uds if adding tramadol effective.  Mackie Pai, PA-C

## 2017-07-16 NOTE — Patient Instructions (Signed)
For your recent right hip and groin region pain, I do want to get a x-ray today and verify that you do have arthritis.  Due to the proximity of your pain in the appendix area would like to know relatively quickly that you do have arthritis.  You had x-rays done recently at orthopedist but it may take some time to get those reports sent to [place].  Therefore getting test done today.  Also want you to get a CBC today in our lab.  Continue gabapentin for your pain.  I talked with pharmacist downstairs and he states that in most cases that is an appropriate dose.  The neurologist did go ahead and send that to [facility].  I am providing a 5-day prescription of tramadol.  We will see if this is adequate for your pain control.  If so then we need to do come back next week to sign a contract and to get drug screen testing.  Then I would be able to give you a larger volume.  If right hip region pain worsens severely after hours then recommend ED evaluation.  I did refill all your chronic medications today.  Follow-up in 3 weeks or as needed.

## 2017-07-16 NOTE — Telephone Encounter (Signed)
Advised ED evaluation. Wife agreed would take him now.

## 2017-07-16 NOTE — Telephone Encounter (Signed)
"  CRITICAL VALUE STICKER  CRITICAL VALUE:HGb 6.1 & HCT 17.9  RECEIVER (on-site recipient of call):KMP  DATE & TIME NOTIFIED: 4235 07/16/17  MESSENGER (representative from lab):Reed Breech  MD NOTIFIED: [place]  TIME OF NOTIFICATION:1640  RESPONSE: Will take care of it

## 2017-07-17 ENCOUNTER — Telehealth: Payer: Self-pay | Admitting: Medical

## 2017-07-17 NOTE — Telephone Encounter (Unsigned)
Copied from [place]. Topic: Quick Communication - Office Called Patient >> Jul 17, 2017 10:39 AM Neva Seat wrote: Pt's wife [name] called to let office know Pt is in hospital.  If the office needs to contact her please give her a call.

## 2017-07-22 ENCOUNTER — Telehealth: Payer: Self-pay | Admitting: *Deleted

## 2017-07-22 NOTE — Telephone Encounter (Signed)
Received Home Health Certification and Plan of Care; forwarded to provider/SLS 05/08  

## 2017-07-24 MED ORDER — CARVEDILOL 3.125 MG PO TABS
3.13 | ORAL_TABLET | ORAL | Status: DC
Start: 2017-07-24 — End: 2017-07-24

## 2017-07-24 MED ORDER — GENERIC EXTERNAL MEDICATION
75.00 | Status: DC
Start: 2017-07-25 — End: 2017-07-24

## 2017-07-24 MED ORDER — TRAMADOL HCL 50 MG PO TABS
50.00 | ORAL_TABLET | ORAL | Status: DC
Start: 2017-07-24 — End: 2017-07-24

## 2017-07-24 MED ORDER — POLYETHYLENE GLYCOL 3350 17 G PO PACK
17.00 g | PACK | ORAL | Status: DC
Start: 2017-07-25 — End: 2017-07-24

## 2017-07-24 MED ORDER — GABAPENTIN 300 MG PO CAPS
300.00 | ORAL_CAPSULE | ORAL | Status: DC
Start: 2017-07-24 — End: 2017-07-24

## 2017-07-24 MED ORDER — GLUCOSE 40 % PO GEL
15.00 g | ORAL | Status: DC
Start: ? — End: 2017-07-24

## 2017-07-24 MED ORDER — AMLODIPINE BESYLATE 5 MG PO TABS
10.00 | ORAL_TABLET | ORAL | Status: DC
Start: 2017-07-25 — End: 2017-07-24

## 2017-07-24 MED ORDER — GENERIC EXTERNAL MEDICATION
5.00 | Status: DC
Start: ? — End: 2017-07-24

## 2017-07-24 MED ORDER — INSULIN LISPRO 100 UNIT/ML ~~LOC~~ SOLN
1.00 | SUBCUTANEOUS | Status: DC
Start: 2017-07-24 — End: 2017-07-24

## 2017-07-24 MED ORDER — PANTOPRAZOLE SODIUM 40 MG PO TBEC
40.00 | DELAYED_RELEASE_TABLET | ORAL | Status: DC
Start: 2017-07-24 — End: 2017-07-24

## 2017-07-24 MED ORDER — LORAZEPAM 0.5 MG PO TABS
0.25 | ORAL_TABLET | ORAL | Status: DC
Start: 2017-07-27 — End: 2017-07-24

## 2017-07-24 MED ORDER — DEXTROSE 50 % IV SOLN
12.00 g | INTRAVENOUS | Status: DC
Start: ? — End: 2017-07-24

## 2017-07-24 MED ORDER — ONDANSETRON HCL 4 MG/2ML IJ SOLN
4.00 | INTRAMUSCULAR | Status: DC
Start: ? — End: 2017-07-24

## 2017-07-24 MED ORDER — OXYCODONE HCL 5 MG PO TABS
5.00 | ORAL_TABLET | ORAL | Status: DC
Start: ? — End: 2017-07-24

## 2017-07-24 MED ORDER — GENERIC EXTERNAL MEDICATION
1.00 | Status: DC
Start: ? — End: 2017-07-24

## 2017-07-24 MED ORDER — SODIUM CHLORIDE 0.9 % IV SOLN
250.00 | INTRAVENOUS | Status: DC
Start: ? — End: 2017-07-24

## 2017-07-24 MED ORDER — POLYVINYL ALCOHOL 1.4 % OP SOLN
1.00 | OPHTHALMIC | Status: DC
Start: ? — End: 2017-07-24

## 2017-07-24 MED ORDER — HYDRALAZINE HCL 20 MG/ML IJ SOLN
10.00 | INTRAMUSCULAR | Status: DC
Start: ? — End: 2017-07-24

## 2017-07-24 MED ORDER — ATORVASTATIN CALCIUM 40 MG PO TABS
80.00 | ORAL_TABLET | ORAL | Status: DC
Start: 2017-07-24 — End: 2017-07-24

## 2017-07-24 MED ORDER — DONEPEZIL HCL 5 MG PO TABS
5.00 | ORAL_TABLET | ORAL | Status: DC
Start: 2017-07-24 — End: 2017-07-24

## 2017-07-28 ENCOUNTER — Telehealth: Payer: Self-pay | Admitting: *Deleted

## 2017-07-28 NOTE — Telephone Encounter (Signed)
Received Physician Orders from [facility]; forwarded to provider/SLS

## 2017-07-30 ENCOUNTER — Encounter: Payer: Self-pay | Admitting: Medical

## 2017-07-30 ENCOUNTER — Telehealth: Payer: Self-pay | Admitting: Medical

## 2017-07-30 NOTE — Telephone Encounter (Signed)
Copied from [place]. Topic: Quick Communication - See Telephone Encounter >> Jul 30, 2017 11:19 AM Conception Chancy, NT wrote: Constance Haw is calling from [facility] she is a physical therapist and would like verbal orders for PT 2 week 1, 1 week 1, and 2 week 2 starting next week.   [place] Cb# [phone] ok to leave VM.

## 2017-07-31 ENCOUNTER — Telehealth: Payer: Self-pay | Admitting: Medical

## 2017-07-31 ENCOUNTER — Telehealth: Payer: Self-pay | Admitting: *Deleted

## 2017-07-31 DIAGNOSIS — M25551 Pain in right hip: Secondary | ICD-10-CM

## 2017-07-31 NOTE — Telephone Encounter (Signed)
Left message to home health giving verbal orders for patient.

## 2017-07-31 NOTE — Telephone Encounter (Signed)
Received Home Health Certification and Plan of Care; forwarded to provider/SLS 05/17

## 2017-07-31 NOTE — Telephone Encounter (Signed)
Open for review.  Regarding patient's hip pain I do think it is best that he see sports medicine.  I sent patient his wife a message on my chart.

## 2017-08-04 ENCOUNTER — Encounter: Payer: Self-pay | Admitting: Family Medicine

## 2017-08-04 ENCOUNTER — Ambulatory Visit (INDEPENDENT_AMBULATORY_CARE_PROVIDER_SITE_OTHER): Payer: Medicare Other | Admitting: Family Medicine

## 2017-08-04 VITALS — BP 110/76 | HR 85 | Ht 67.0 in | Wt 130.0 lb

## 2017-08-04 DIAGNOSIS — M25551 Pain in right hip: Secondary | ICD-10-CM | POA: Diagnosis present

## 2017-08-04 MED ORDER — OXYCODONE-ACETAMINOPHEN 7.5-325 MG PO TABS: 1.0000 | ORAL_TABLET | ORAL | 0 refills | Status: DC | PRN

## 2017-08-04 NOTE — Patient Instructions (Signed)
I'm concerned you either have a sarcoma or myositis ossificans. We will go ahead with an MRI of your hip to further assess. Take oxycodone as needed for severe pain. Topical capsaicin, biofreeze up to 4 times a day. Follow up will depend on the MRI results.

## 2017-08-04 NOTE — Progress Notes (Signed)
PCP: Mackie Pai, PA-C  Subjective:   HPI: Patient is a 72 y.o. male with extensive medical history significant for ESRD, stroke, prostate cancer, and prior GI bleed who presents with right groin pain for the past 6-8 months. He cannot recall any injury that triggered the onset of his pain. He reports that the pain is located at the superior aspect of the medial thigh and is 10/10 in severity. It is sharp in quality. It does not radiate to other aspects of his leg. The pain is present at rest. It is worst when lying down. He has tried tylenol and tramadol, but neither provided relief. He denies pain in the left hip. Prior to 6-8 months ago, he was able to ambulate without pain. He has been using a wheelchair for the past few weeks due to pain. He has lost about 30lbs in the past couple of years, but he attributes this to decreased appetite associated with his renal disease. He denies nightsweats. He denies numbness or tingling. He denies any history of trauma to the painful region.   ROS: See HPI.  Past Medical History:  Diagnosis Date  . 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 Anemia, iron deficiency 03/24/2011   a. Recurrent GI bleed, tx with periodic iron infusions.  . 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 AVM (arteriovenous malformation) of duodenum, acquired    egds in 01/2012, 04/2011  . BPH (benign prostatic hyperplasia)   . CAD (coronary artery disease)    a. Cath 09/2012: moderate borderline CAD in mid LAD/small diagonal branch, mild RCA stenosis, to be managed medically   . Chronic lower back pain   . Diabetic peripheral neuropathy ([place]) 2014   foot pain.  . Essential hypertension 02/01/2012  . Fatty liver    on CT of 11/2010  . GERD (gastroesophageal reflux disease)   . GI bleed    a. Recurrent GI bleed, tx with IV iron. b. Per heme notes - likely AVMs 04/2012 (tx with cauterization several months ago).  . Hematuria    a. Urology note scan from 07/2012: cystoscopy without  evidence for bladder lesion, only lateral hypertrophy of posterior urethra, bladder impression from BPH. b. Pt states he had "some tests" scheduled for later in July 2014.  . High cholesterol   . Hyperlipidemia 02/01/2012  . Hypertension   . Hypertensive urgency 07/06/2016  . 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 Orthostatic hypotension    a. Tx with florinef.  . Peripheral neuropathy   . Polyp, colonic    Colonoscopy 01/2012 "benign" polyp  . Prostate cancer ([place])   . Sleep apnea    "had mask; couldn't sleep in it" (09/20/2012)  . Smoker   . SOB (shortness of breath) 07/06/2016  . Stage III chronic kidney disease ([name])    a. Stage 3 (DM with complications ->CKD, peripheral neuropathy).  . Stroke ([place])    x 2  . Symptomatic anemia 04/03/2015  . Tinea pedis 05/24/2012  . Tobacco use 02/01/2012  . Type 2 diabetes, uncontrolled, with neuropathy ([place]) 02/01/2012  . Type II diabetes mellitus ([place])    a. Dx 1994, uncontrolled.     Current Outpatient Medications on File Prior to Visit  Medication Sig Dispense Refill  . ALPRAZolam (XANAX) 0.25 MG tablet Take 1 tablet (0.25 mg total) by mouth every other day as needed for anxiety (please give 30 min prior to dialysis.  Please HOLD this medication if pt has received any sedation/ procedures for that day).  6 tablet 0  . amLODipine (NORVASC) 10 MG tablet 1 tab PO QD FOR HTN 90 tablet 3  . ARTIFICIAL TEAR SOLUTION OP Instill 1-2 drops into both eyes two to three times a day as needed for dry eyes    . aspirin EC 325 MG tablet Take 1 tablet (325 mg total) by mouth daily. (Patient not taking: Reported on 07/16/2017) 30 tablet 0  . atorvastatin (LIPITOR) 80 MG tablet Take 1 tablet (80 mg total) by mouth at bedtime. 30 tablet 11  . carvedilol (COREG) 3.125 MG tablet Take 1 tablet (3.125 mg total) by mouth 2 (two) times daily with a meal. 60 tablet 11  . ciprofloxacin (CIPRO) 250 MG tablet TK 1 T PO  ONCE DAILY  0  . clopidogrel (PLAVIX)  75 MG tablet 1 tab  PO QD FOR ANTICOAGULANT 30 tablet 11  . donepezil (ARICEPT) 5 MG tablet Take 1 tablet (5 mg total) by mouth at bedtime. 30 tablet 11  . famotidine (PEPCID) 20 MG tablet T PO BID FOR GERD 60 tablet 11  . gabapentin (NEURONTIN) 100 MG capsule Take 1 capsule (100 mg total) by mouth daily as needed (after HD treatment only). 30 capsule 0  . gabapentin (NEURONTIN) 300 MG capsule Take 1 capsule (300 mg total) by mouth 2 (two) times daily. 90 capsule 11  . Maltodextrin-Xanthan Gum (RESOURCE THICKENUP CLEAR) POWD Take 1 g by mouth as needed. (Patient not taking: Reported on 07/16/2017)    . multivitamin (RENA-VIT) TABS tablet Take 1 tablet by mouth every Monday, Wednesday, and Friday.     [name] Kitchen QUEtiapine (SEROQUEL) 25 MG tablet Take 1 tablet (25 mg total) by mouth 3 (three) times daily. (Patient not taking: Reported on 07/16/2017) 9 tablet 0  . ranitidine (ZANTAC) 150 MG capsule Take 1 capsule (150 mg total) by mouth 2 (two) times daily. (Patient not taking: Reported on 07/16/2017) 180 capsule 1  . sertraline (ZOLOFT) 25 MG tablet 2 tabs  PO QD FOR ANTI DEPRESSANT 30 tablet 11  . traMADol (ULTRAM) 50 MG tablet Take 1 tablet (50 mg total) by mouth 2 (two) times daily. 10 tablet 0  . Valproic Acid 500 MG CPDR Take 1 capsule (500 mg total) by mouth 2 (two) times daily. (Patient not taking: Reported on 07/16/2017) 8 each 0   No current facility-administered medications on file prior to visit.     Past Surgical History:  Procedure Laterality Date  . ENTEROSCOPY N/A 03/01/2013   Procedure: ENTEROSCOPY;  Surgeon: Inda Castle, MD;  Location: [facility];  Service: Endoscopy;  Laterality: N/A;  . ENTEROSCOPY N/A 06/26/2016   Procedure: ENTEROSCOPY;  Surgeon: Milus Banister, MD;  Location: [facility];  Service: Endoscopy;  Laterality: N/A;  this is a push enteroscopy  . ENTEROSCOPY N/A 10/23/2016   Procedure: ENTEROSCOPY;  Surgeon: Gatha Mayer, MD;  Location: [facility] ENDOSCOPY;  Service: Endoscopy;   Laterality: N/A;  . GIVENS CAPSULE STUDY N/A 04/05/2015   Procedure: GIVENS CAPSULE STUDY;  Surgeon: Gatha Mayer, MD;  Location: [facility];  Service: Endoscopy;  Laterality: N/A;  . INGUINAL HERNIA REPAIR Right 2011  . LEFT HEART CATH AND CORONARY ANGIOGRAPHY N/A 12/23/2016   Procedure: LEFT HEART CATH AND CORONARY ANGIOGRAPHY;  Surgeon: Leonie Man, MD;  Location: [facility];  Service: Cardiovascular;  Laterality: N/A;  . LEFT HEART CATHETERIZATION WITH CORONARY ANGIOGRAM N/A 09/21/2012   Procedure: LEFT HEART CATHETERIZATION WITH CORONARY ANGIOGRAM;  Surgeon: Peter M Martinique, MD;  Location:  [facility];  Service: Cardiovascular;  Laterality: N/A;  . LUMBAR DISC SURGERY  1980's  . LYMPHADENECTOMY Bilateral 01/19/2013   Procedure: LYMPHADENECTOMY;  Surgeon: Bernestine Amass, MD;  Location: [facility];  Service: Urology;  Laterality: Bilateral;  . ROBOT ASSISTED LAPAROSCOPIC RADICAL PROSTATECTOMY N/A 01/19/2013   Procedure: ROBOTIC ASSISTED LAPAROSCOPIC RADICAL PROSTATECTOMY;  Surgeon: Bernestine Amass, MD;  Location: [facility];  Service: Urology;  Laterality: N/A;  . SHOULDER OPEN ROTATOR CUFF REPAIR Left 1980's    No Known Allergies  Social History   Socioeconomic History  . Marital status: Married    Spouse name: Not on file  . Number of children: 2  . Years of education: Not on file  . Highest education level: Not on file  Occupational History  . Occupation: taken out of work due to back  Social Needs  . Financial resource strain: Not on file  . Food insecurity:    Worry: Not on file    Inability: Not on file  . Transportation needs:    Medical: Not on file    Non-medical: Not on file  Tobacco Use  . Smoking status: Current Every Day Smoker    Packs/day: 0.50    Years: 30.00    Pack years: 15.00    Types: Cigarettes    Start date: 04/15/1968  . Smokeless tobacco: Never Used  . Tobacco comment: start back a couple of months ago  Substance and Sexual Activity  .  Alcohol use: No    Alcohol/week: 0.0 oz    Comment: 09/20/2012 "Used to; stopped ~ 2009; never had problem w/it"  . Drug use: No  . Sexual activity: Never  Lifestyle  . Physical activity:    Days per week: Not on file    Minutes per session: Not on file  . Stress: Not on file  Relationships  . Social connections:    Talks on phone: Not on file    Gets together: Not on file    Attends religious service: Not on file    Active member of club or organization: Not on file    Attends meetings of clubs or organizations: Not on file    Relationship status: Not on file  . Intimate partner violence:    Fear of current or ex partner: Not on file    Emotionally abused: Not on file    Physically abused: Not on file    Forced sexual activity: Not on file  Other Topics Concern  . Not on file  Social History Narrative   Regular exercise: rides horses   Caffeine use: occasionally    Family History  Problem Relation Age of Onset  . Stroke Father        Died at 69  . Diabetes Mother   . Heart attack Brother        Died at 26  . Diabetes Sister   . Stomach cancer Brother   . Heart attack Sister     BP 110/76   Pulse 85   Ht 5\' 7"  (1.702 m)   Wt 130 lb (59 kg)   BMI 20.36 kg/m        Objective:  Physical Exam:  Gen: NAD, uncomfortable appearing, sitting in wheelchair CV: warm, well-perfused Pulm: non-labored breathing  Right LE: No deformity. Tender to palpation throughout the superomedial aspect of the thigh. No TTP at [facility] joint, greater trochanter, lateral thigh. Decreased ROM at knee to 160 degress of extension and 100 degrees of flexion.  Strength 4/5 at knee with flexion/extension. Pain with passive internal and external rotation at the hip. Pain-limited ROM and strength testing with flexion and adduction of hip. No weakness or pain with hip extension or abduction. Patient unable to lie down 2/2 pain. Grossly NVI distally.   Left LE: No deformity. FROM with 5/5 strength. No  tenderness to palpation. Grossly NVI distally.    Assessment & Plan:  1. Right leg pain: Patient presents with 6-8 month history of severe pain in the medial aspect of his superior thigh. Radiographs from earlier this month reveal an area of calcification that appears to be within the adductor muscles. This finding was not present on radiographs from 09/2016. Differential includes myositis ossificans versus soft tissue sarcoma. Given a lack of trauma, there is no known historical etiology for myositis ossificans. Will obtain MRI to further characterize the area of calcification. If findings are consistent with sarcoma, will refer for biopsy. Will follow-up once imaging is completed.      Louretta Parma, MS4

## 2017-08-05 ENCOUNTER — Telehealth: Payer: Self-pay | Admitting: *Deleted

## 2017-08-05 ENCOUNTER — Encounter: Payer: Self-pay | Admitting: Family Medicine

## 2017-08-05 NOTE — Assessment & Plan Note (Signed)
Patient presents with 6-8 month history of severe pain in the medial aspect of his superior thigh. Radiographs from earlier this month reveal an area of calcification that appears to be within the adductor muscles. This finding was not present on radiographs from 09/2016. Differential includes myositis ossificans versus soft tissue sarcoma. Given a lack of trauma, there is no known historical etiology for myositis ossificans. Will obtain MRI to further characterize the area of calcification. If findings are consistent with sarcoma, will refer for biopsy. Will follow-up once imaging is completed.

## 2017-08-05 NOTE — Telephone Encounter (Signed)
Received Resumption of Care Home Health Certification and Plan of Care; forwarded to provider/SLS 05/22

## 2017-08-06 ENCOUNTER — Ambulatory Visit (INDEPENDENT_AMBULATORY_CARE_PROVIDER_SITE_OTHER): Payer: Medicare Other | Admitting: Adult Health

## 2017-08-06 ENCOUNTER — Encounter: Payer: Self-pay | Admitting: Adult Health

## 2017-08-06 ENCOUNTER — Telehealth: Payer: Self-pay | Admitting: Medical

## 2017-08-06 VITALS — BP 142/77 | HR 78 | Ht 67.0 in | Wt 122.4 lb

## 2017-08-06 DIAGNOSIS — R413 Other amnesia: Secondary | ICD-10-CM

## 2017-08-06 DIAGNOSIS — Z8673 Personal history of transient ischemic attack (TIA), and cerebral infarction without residual deficits: Secondary | ICD-10-CM | POA: Diagnosis not present

## 2017-08-06 DIAGNOSIS — E785 Hyperlipidemia, unspecified: Secondary | ICD-10-CM

## 2017-08-06 DIAGNOSIS — E1159 Type 2 diabetes mellitus with other circulatory complications: Secondary | ICD-10-CM

## 2017-08-06 DIAGNOSIS — I10 Essential (primary) hypertension: Secondary | ICD-10-CM

## 2017-08-06 MED ORDER — DONEPEZIL HCL 10 MG PO TABS: 10.0000 mg | ORAL_TABLET | Freq: Every day | ORAL | 3 refills | Status: DC

## 2017-08-06 NOTE — Telephone Encounter (Signed)
On 08-06-2017. Dr. Etter Sjogren and I signed Home health orders. This is following recent hospitazion. Reviewed goals of home health eval. This will hopefully reduce chance of readmission.  Please fax over forms. I think Gatha Mayer is not in the office until next week.

## 2017-08-06 NOTE — Progress Notes (Signed)
[facility] [address] [place]. [place] [address] [phone]       OFFICE FOLLOW-UP NOTE  [name] Date of Birth:  01/17/1946 Medical Record Number:  [id number]   HPI: Initial office visit 08/21/2016 : 32 year African-American male seen today for first office follow-up visit following hospital admission for lacunar infarct in March 2018. History is obtained from the patient, review of electronic medical records and have personally reviewed imaging films. [name] is a 72 y.o. male he reports that his right leg has not been working very well for the past 2 weeks. He also describes that it feels slightly numb, but I'm not sure if he means numb or weak because he describes numbness as "it is not working right." Initially a code stroke was called because he is a very poor historian and when asked when he was last normal, he initially reports 6 AM. I then ask him when things started and he again reports 6 AM. I then asked when it was last completely normal, and he reported 2 weeks ago. He has been having progressive difficulty with walking since then.He was seen at dialysis were hemoglobin was measured and was found to before. 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 tpa given?: no, out of window. CT scan of the head showed no acute abnormality. MRI showed punctate acute infarcts along the lateral margin of the right thalamus. MRA of the brain showed no large vessel stenosis. Thoracic echo showed diminished ejection fraction of 45-50% with diffuse hypokinesis.. Carotid ultrasound showed no significant except been stenosis. LDL cholesterol 69 mg percent. Hemoglobin A1c was 4.9. He was seen by physical occupational therapy and started on Plavix for stroke prevention. He states his done well since discharge. His right leg continues to be weak. Did have a previous stroke in 2017 which  left him with residual right-sided weakness on that she has not fully recovered. Update 01/07/2017 (PS) : he returns for follow-up after last visit 4 months ago. He and his wife called requesting this visit because of new problems. Patient states she's had memory difficulties and cognitive decline ever since last 6 months and the symptoms to proceed. He forgets recent information and at times has trouble Reading sentences and finding words. He knows what he wants to say but wants to come out easily. He is also requiring more help to activities like buttoning his clothes and not himself. He does use a cane but states his balance is poor and was frequently. Patient says he has not discussed this with his primary physician and has not had any recent lab work or brain imaging studies done. The patient is not been taking aspirin despite history of stroke due to history of GI bleeding. His blood pressure usually tends to run low sided today it is 88/56. He is tolerating Crestor well without muscle aches or pains. He denies any significant head injury with loss of consciousness, seizures, new strokes or TIA symptoms  07/07/17 visit: Since previous visit, patient was admitted on 02/2017 for memory decline and agitation.  CT showed left temporal small SDH and right posterior fossa hygroma.  MRI was negative, EEG was negative.  Patient was diagnosed with encephalopathy with possible seizure activity.  Was put on Depakote.  However, patient did not fill the medication after discharge.  Patient returned to hospital on 03/20/2017 for altered mental  status.  Incidental stroke was found in the left cerebellum which was secondary to small vessel disease in the setting of hypotension.  EEG negative for seizure activity.  MRA and carotid Dopplers were negative.  LDL 135 A1c 5.3.  Patient was not on antithrombotic prior to this admission and it was recommended the patient start aspirin 325 mg daily.  Also recommended continue Lipitor  80 mg at discharge for cholesterol control.  It was determined that altered mental status could be multifactorial including vascular dementia, end-stage renal disease, deconditioning and hypotension.  Altered mental status did improve after IV fluids.  Hospice referral was recommended but family/POA have requested continued aggressive care and hospice plans were canceled. Recommended that patient continue Depakote 500mg  BID. Patient was discharged to long-term care facility.   Since hospital discharge, patient has been residing at the rehab center at [facility] where he does get daily PT/OT/ST.  Continuing HD on M/W/F through a fistula in his left arm.  Family is concerned about recent memory decline and being verbally and physically abusive towards family.  MMSE today 12/30.  Patient does have Seroquel and Xanax prescribed and daughter states that he does get very lethargic with 1 of those medications but is unsure which one.  Patient has complaints about right leg pain/burning sensation that radiates from his hip down to his ankle and burning sensation on the sacrum.  Currently takes gabapentin 100 mg 3 times daily which patient tolerates okay without side effects.  Recommendation from neurology to be discharged on aspirin 325 but order was not placed at discharge.  Patient continues to take Lipitor without side effects of myalgias.  Denies new or worsening stroke/TIA symptoms.  08/06/17 UPDATE: [name] returns today for one-month follow-up and is accompanied by his daughter.  At previous appointment, patient was started on Aricept 5 mg nightly for worsening memory loss.  Daughter states patient is tolerating this well without GI upset or any other side effects.  Daughter believes that memory continues to worsen.  She also has concerns of sundowning agitation.  Patient recently hospitalized on 07/16/2017 for anemia requiring transfusion secondary to AVMs with complaints of black tarry stools for the past few days.   During admission, patient's family requested removal of PEG tube and this was removed by GI.  Patient was discharged on Plavix and instructed to stop aspirin.  Since this hospitalization, patient denies bleeding or bruising.  Does have complaints of increased right hip pain that radiates down his leg.  Patient was complaining of this at previous appointment but not to this extent.  He is currently sitting in wheelchair and is unable to ambulate.  Frequent grimacing due to intense pain.  Patient has MRI hip scheduled for this Saturday, 08/08/2017.  Patient also states that he feels numbness and tingling in this leg.  At previous appointment, gabapentin was increased but due to kidney function and creatinine clearance of 4 per original Cockcroft-Gault calculator, gabapentin is unable to be increased further.  After MRI, Cymbalta can be considered for additional nerve pain management.  Patient has been taking oxycodone-acetaminophen 7.5-325 mg as needed for pain but per daughter, he has been taking this 1-2 times daily.  ROS:   14 system review of systems is positive for eye discharge, joint pain, walking difficulty, speech difficulty, weakness, agitation, behavior problems and confusion and all other systems negative  PMH:  Past Medical History:  Diagnosis Date  . 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 Anemia, iron deficiency 03/24/2011  a. Recurrent GI bleed, tx with periodic iron infusions.  . 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 AVM (arteriovenous malformation) of duodenum, acquired    egds in 01/2012, 04/2011  . BPH (benign prostatic hyperplasia)   . CAD (coronary artery disease)    a. Cath 09/2012: moderate borderline CAD in mid LAD/small diagonal branch, mild RCA stenosis, to be managed medically   . Chronic lower back pain   . Diabetic peripheral neuropathy ([place]) 2014   foot pain.  . Essential hypertension 02/01/2012  . Fatty liver    on CT of 11/2010  . GERD (gastroesophageal reflux  disease)   . GI bleed    a. Recurrent GI bleed, tx with IV iron. b. Per heme notes - likely AVMs 04/2012 (tx with cauterization several months ago).  . Hematuria    a. Urology note scan from 07/2012: cystoscopy without evidence for bladder lesion, only lateral hypertrophy of posterior urethra, bladder impression from BPH. b. Pt states he had "some tests" scheduled for later in July 2014.  . High cholesterol   . Hyperlipidemia 02/01/2012  . Hypertension   . Hypertensive urgency 07/06/2016  . 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 Orthostatic hypotension    a. Tx with florinef.  . Peripheral neuropathy   . Polyp, colonic    Colonoscopy 01/2012 "benign" polyp  . Prostate cancer ([place])   . Sleep apnea    "had mask; couldn't sleep in it" (09/20/2012)  . Smoker   . SOB (shortness of breath) 07/06/2016  . Stage III chronic kidney disease ([name])    a. Stage 3 (DM with complications ->CKD, peripheral neuropathy).  . Stroke ([place])    x 2  . Symptomatic anemia 04/03/2015  . Tinea pedis 05/24/2012  . Tobacco use 02/01/2012  . Type 2 diabetes, uncontrolled, with neuropathy ([place]) 02/01/2012  . Type II diabetes mellitus ([place])    a. Dx 1994, uncontrolled.     Social History:  Social History   Socioeconomic History  . Marital status: Married    Spouse name: Not on file  . Number of children: 2  . Years of education: Not on file  . Highest education level: Not on file  Occupational History  . Occupation: taken out of work due to back  Social Needs  . Financial resource strain: Not on file  . Food insecurity:    Worry: Not on file    Inability: Not on file  . Transportation needs:    Medical: Not on file    Non-medical: Not on file  Tobacco Use  . Smoking status: Former Smoker    Packs/day: 0.50    Years: 30.00    Pack years: 15.00    Types: Cigarettes    Start date: 04/15/1968    Last attempt to quit: 08/06/2016    Years since quitting: 1.0  . Smokeless tobacco: Never Used   . Tobacco comment: start back a couple of months ago  Substance and Sexual Activity  . Alcohol use: No    Alcohol/week: 0.0 oz    Comment: 09/20/2012 "Used to; stopped ~ 2009; never had problem w/it"  . Drug use: No  . Sexual activity: Never  Lifestyle  . Physical activity:    Days per week: Not on file    Minutes per session: Not on file  . Stress: Not on file  Relationships  . Social connections:    Talks on phone: Not on file    Gets together: Not on file  Attends religious service: Not on file    Active member of club or organization: Not on file    Attends meetings of clubs or organizations: Not on file    Relationship status: Not on file  . Intimate partner violence:    Fear of current or ex partner: Not on file    Emotionally abused: Not on file    Physically abused: Not on file    Forced sexual activity: Not on file  Other Topics Concern  . Not on file  Social History Narrative   Regular exercise: rides horses   Caffeine use: occasionally    Medications:   Current Outpatient Medications on File Prior to Visit  Medication Sig Dispense Refill  . amLODipine (NORVASC) 10 MG tablet 1 tab PO QD FOR HTN 90 tablet 3  . ARTIFICIAL TEAR SOLUTION OP Instill 1-2 drops into both eyes two to three times a day as needed for dry eyes    . aspirin EC 325 MG tablet Take 1 tablet (325 mg total) by mouth daily. 30 tablet 0  . atorvastatin (LIPITOR) 80 MG tablet Take 1 tablet (80 mg total) by mouth at bedtime. 30 tablet 11  . carvedilol (COREG) 3.125 MG tablet Take 1 tablet (3.125 mg total) by mouth 2 (two) times daily with a meal. 60 tablet 11  . ciprofloxacin (CIPRO) 250 MG tablet TK 1 T PO  ONCE DAILY  0  . clopidogrel (PLAVIX) 75 MG tablet 1 tab  PO QD FOR ANTICOAGULANT 30 tablet 11  . famotidine (PEPCID) 20 MG tablet T PO BID FOR GERD 60 tablet 11  . gabapentin (NEURONTIN) 100 MG capsule Take 1 capsule (100 mg total) by mouth daily as needed (after HD treatment only). 30 capsule  0  . gabapentin (NEURONTIN) 300 MG capsule Take 1 capsule (300 mg total) by mouth 2 (two) times daily. 90 capsule 11  . Maltodextrin-Xanthan Gum (RESOURCE THICKENUP CLEAR) POWD Take 1 g by mouth as needed.    . multivitamin (RENA-VIT) TABS tablet Take 1 tablet by mouth every Monday, Wednesday, and Friday.     [name] Kitchen oxyCODONE-acetaminophen (PERCOCET) 7.5-325 MG tablet Take 1 tablet by mouth every 4 (four) hours as needed for severe pain. 30 tablet 0  . QUEtiapine (SEROQUEL) 25 MG tablet Take 1 tablet (25 mg total) by mouth 3 (three) times daily. 9 tablet 0  . ranitidine (ZANTAC) 150 MG capsule Take 1 capsule (150 mg total) by mouth 2 (two) times daily. 180 capsule 1  . sertraline (ZOLOFT) 25 MG tablet 2 tabs  PO QD FOR ANTI DEPRESSANT 30 tablet 11  . traMADol (ULTRAM) 50 MG tablet Take 1 tablet (50 mg total) by mouth 2 (two) times daily. 10 tablet 0  . Valproic Acid 500 MG CPDR Take 1 capsule (500 mg total) by mouth 2 (two) times daily. 8 each 0   No current facility-administered medications on file prior to visit.     Allergies:   Allergies  Allergen Reactions  . Valproic Acid Other (See Comments)    Physical Exam General: Cachectic frail middle-aged African-American male, seated, in no evident distress Head: head normocephalic and atraumatic.  Neck: supple with no carotid or supraclavicular bruits Cardiovascular: regular rate and rhythm, no murmurs Musculoskeletal: no deformity Skin:  no rash/petichiae Vascular:  Normal pulses all extremities Vitals:   08/06/17 0931  BP: (!) 142/77  Pulse: 78   Neurologic Exam Mental Status: Awake and fully alert.  Mood and affect appropriate.  Did not check  MMSE at this appointment as one was completed 1 month ago.  Patient was disoriented to place, time and to his own birth date but able to tell me his name MMSE - Mini Mental State Exam 07/07/2017 01/07/2017  Orientation to time 2 5  Orientation to Place 2 4  Registration 3 3  Attention/  Calculation 0 0  Attention/Calculation-comments - pt stated he cannot spell the letter world or count backwards  Recall 0 1  Language- name 2 objects 1 2  Language- repeat 1 1  Language- follow 3 step command 3 3  Language- read & follow direction 0 0  Language-read & follow direction-comments - pt states he cant read it happen a year ago  Write a sentence 0 0  Copy design 0 0  Total score 12 19    Cranial Nerves: Fundoscopic exam reveals sharp disc margins. Pupils equal, briskly reactive to light. Extraocular movements full without nystagmus. Visual fields full to confrontation. Hearing intact. Facial sensation intact. Face, tongue, palate moves normally and symmetrically.  Motor: Normal bulk and tone. Normal strength in all tested extremity muscles.  Unable to adequately test right leg due to pain Sensory.: intact to touch ,pinprick .position and vibratory sensation.  Coordination: Rapid alternating movements normal in all extremities.  Gait and Station: Patient currently sitting in wheelchair and is unable to ambulate  Due to right hip pain Reflexes: 2+ and symmetric. Toes downgoing.     ASSESSMENT: [name] is a 72 year old male who was recently admitted January 2019 for altered mental status and incidental left cerebellum infarct was identified but this acute infarct was determined not to be the reason behind his altered mental status as this could be multifactorial including vascular dementia, ESRD, deconditioning and hypotension.  Patient was discharged to long-term care facility.  Patient does have extensive history of past strokes, CAD, diabetes, HTN, HLD, OSA, dementia, current smoker and ESRD on dialysis.  Patient returns today for one-month follow-up for memory and nerve pain.  PLAN: -continue Plavix daily  and lipitor  for secondary stroke prevention -increase aricept from 5mg  to 10mg  daily -Will call daughter after MRI results for possible consideration of starting Cymbalta  for nerve pain as long as no other muscle skeletal issue or modality is to be found on MRI -Maintain strict control of hypertension with blood pressure goal below 130/90, diabetes with hemoglobin A1c goal below 6.5% and cholesterol with LDL cholesterol (bad cholesterol) goal below 70 mg/dL. I also advised the patient to eat a healthy diet with plenty of whole grains, cereals, fruits and vegetables, exercise regularly and maintain ideal body weight.  Followup in the future with me in 3 month or call earlier if needed  Greater than 50% time during this 25 minute consultation visit was spent on counseling and coordination of care about HLD, DM, HTN, OSA, memory loss (risk factors), discussion about risk benefit of anticoagulation and answering questions.   Venancio Poisson, AGNP-BC  [facility] [address] [place], [place] [address]  Phone [phone] Fax [phone]

## 2017-08-06 NOTE — Patient Instructions (Addendum)
Continue clopidogrel 75 mg daily  and lipitor  for secondary stroke prevention  Increase aricept from 5mg  to 10mg  nightly  I will review MRI mid-next week and will call you if we should start Cymbalta  Continue to follow up with PCP regarding cholesterol diabetes and blood pressure management   Continue to monitor blood pressure at home  Maintain strict control of hypertension with blood pressure goal below 130/90, diabetes with hemoglobin A1c goal below 6.5% and cholesterol with LDL cholesterol (bad cholesterol) goal below 70 mg/dL. I also advised the patient to eat a healthy diet with plenty of whole grains, cereals, fruits and vegetables, exercise regularly and maintain ideal body weight.  Followup in the future with me in 3 months or call earlier if needed        Thank you for coming to see [place] at [facility]. I hope we have been able to provide you high quality care today.  You may receive a patient satisfaction survey over the next few weeks. We would appreciate your feedback and comments so that we may continue to improve ourselves and the health of our patients.

## 2017-08-06 NOTE — Progress Notes (Signed)
Personally  participated in, made any corrections needed, and agree with history, physical, neuro exam,assessment and plan as stated above.    Glenyce Randle, MD [facility] 

## 2017-08-07 NOTE — Telephone Encounter (Signed)
Form faxed

## 2017-08-08 ENCOUNTER — Ambulatory Visit (HOSPITAL_BASED_OUTPATIENT_CLINIC_OR_DEPARTMENT_OTHER)
Admission: RE | Admit: 2017-08-08 | Discharge: 2017-08-08 | Disposition: A | Discharge status: Home / Self Care | Payer: Medicare Other | Source: Ambulatory Visit | Attending: Family Medicine | Admitting: Family Medicine

## 2017-08-08 DIAGNOSIS — M619 Calcification and ossification of muscle, unspecified: Secondary | ICD-10-CM | POA: Insufficient documentation

## 2017-08-08 DIAGNOSIS — M25551 Pain in right hip: Secondary | ICD-10-CM | POA: Insufficient documentation

## 2017-08-08 DIAGNOSIS — M47896 Other spondylosis, lumbar region: Secondary | ICD-10-CM | POA: Insufficient documentation

## 2017-08-08 DIAGNOSIS — M85851 Other specified disorders of bone density and structure, right thigh: Secondary | ICD-10-CM | POA: Insufficient documentation

## 2017-08-08 DIAGNOSIS — M5136 Other intervertebral disc degeneration, lumbar region: Secondary | ICD-10-CM | POA: Diagnosis not present

## 2017-08-08 DIAGNOSIS — R600 Localized edema: Secondary | ICD-10-CM | POA: Diagnosis not present

## 2017-08-11 ENCOUNTER — Encounter (HOSPITAL_BASED_OUTPATIENT_CLINIC_OR_DEPARTMENT_OTHER): Payer: Self-pay

## 2017-08-11 ENCOUNTER — Telehealth: Payer: Self-pay | Admitting: Medical

## 2017-08-11 ENCOUNTER — Ambulatory Visit: Payer: Self-pay | Admitting: Family Medicine

## 2017-08-11 ENCOUNTER — Telehealth: Payer: Self-pay | Admitting: Family Medicine

## 2017-08-11 ENCOUNTER — Emergency Department (HOSPITAL_BASED_OUTPATIENT_CLINIC_OR_DEPARTMENT_OTHER)
Admission: EM | Admit: 2017-08-11 | Discharge: 2017-08-11 | Disposition: A | Discharge status: Home / Self Care | Payer: Medicare Other | Attending: Emergency Medicine | Admitting: Emergency Medicine

## 2017-08-11 ENCOUNTER — Other Ambulatory Visit: Payer: Self-pay

## 2017-08-11 DIAGNOSIS — Z5321 Procedure and treatment not carried out due to patient leaving prior to being seen by health care provider: Secondary | ICD-10-CM | POA: Diagnosis not present

## 2017-08-11 DIAGNOSIS — M25551 Pain in right hip: Secondary | ICD-10-CM | POA: Insufficient documentation

## 2017-08-11 NOTE — Telephone Encounter (Signed)
Called and spoke with patient's wife about [name] MRI.  Fortunately he does not have a sarcoma or evidence of malignancy.  However, he does have muscular edema with calcifications.  Some bony erosion into acetabular wall.  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  Amyloidosis can cause this but is usually more in the shoulder girdle when present related to dialysis and I don't believe causes calcific changes but is possible.  I advised her I will consult with some of my colleagues regarding next steps which may include referral to [facility], discussion with his renal physician, other orthopedists on next steps and call her tomorrow.  She states he is taking oxycodone and tylenol.  He is mostly sleeping on the oxycodone - not able to stay awake without pain unfortunately.  Had reviewed topical medications as well but until we treat the underlying issue I'm afraid we will be unable to find a solution that keeps him awake without a lot of pain.

## 2017-08-11 NOTE — ED Triage Notes (Addendum)
Per pt and wife pt with right hip pain x 1 month-wife states he had MRI 5/25-no results-pt was taken to PCP in the building and advised pt come to ED-wife appears angry that PCP office did not give results for radiology tests x 2 and did not see pt today to treat pain-pt presents to triage in w/c-NAD-occasional grimaces in pain

## 2017-08-11 NOTE — Telephone Encounter (Signed)
Copied from [place] [id number]. Topic: Quick Communication - See Telephone Encounter >> Aug 11, 2017  3:38 PM Rosalin Hawking wrote: CRM for notification. See Telephone encounter for: 08/11/17   Pt's wife and pt came in office stating that she thought pt had an appt with Dr Barbaraann Barthel for today 08-11-2017 (but pt did not have an appt) Wife wanted to have pt to see Percell Miller but provider not in office. Wife states pt had a MRI done and that no one has given them the results of his MRI, wife states is feeling frustrated since pt is still in pain since April and would like to know what is going on. Pt's wife stated will like someone to call her with husbands results of his MRI ASAP (also commented if provider needs to consult anything with [place] who was the last to see pt, would be ok with it). Please advise ASAP.

## 2017-08-12 NOTE — ED Notes (Signed)
Follow up call made  Pt talked to pcp yesterday  1000 08/12/17  s Maxcine Strong rn

## 2017-08-13 MED ORDER — OXYCODONE-ACETAMINOPHEN 7.5-325 MG PO TABS: 1.0000 | ORAL_TABLET | ORAL | 0 refills | Status: DC | PRN

## 2017-08-13 NOTE — Addendum Note (Signed)
Addended by: Dene Gentry on: 08/13/2017 05:19 PM   Modules accepted: Orders

## 2017-08-13 NOTE — Telephone Encounter (Signed)
Sending in a refill for his oxycodone.  We are working on getting him in with pain management as soon as possible.

## 2017-08-14 ENCOUNTER — Telehealth: Payer: Self-pay | Admitting: *Deleted

## 2017-08-14 MED ORDER — GABAPENTIN 100 MG PO CAPS
100.00 | ORAL_CAPSULE | ORAL | Status: DC
Start: 2017-08-14 — End: 2017-08-14

## 2017-08-14 MED ORDER — ATORVASTATIN CALCIUM 40 MG PO TABS
80.00 | ORAL_TABLET | ORAL | Status: DC
Start: ? — End: 2017-08-14

## 2017-08-14 MED ORDER — SERTRALINE HCL 50 MG PO TABS
50.00 | ORAL_TABLET | ORAL | Status: DC
Start: 2017-08-15 — End: 2017-08-14

## 2017-08-14 MED ORDER — CLOPIDOGREL BISULFATE 75 MG PO TABS
75.00 | ORAL_TABLET | ORAL | Status: DC
Start: 2017-08-15 — End: 2017-08-14

## 2017-08-14 MED ORDER — CARVEDILOL 3.125 MG PO TABS
3.13 | ORAL_TABLET | ORAL | Status: DC
Start: 2017-08-14 — End: 2017-08-14

## 2017-08-14 MED ORDER — HEPARIN SODIUM (PORCINE) 5000 UNIT/ML IJ SOLN
5000.00 | INTRAMUSCULAR | Status: DC
Start: 2017-08-14 — End: 2017-08-14

## 2017-08-14 MED ORDER — LIDOCAINE 5 % EX PTCH
2.00 | MEDICATED_PATCH | CUTANEOUS | Status: DC
Start: 2017-08-15 — End: 2017-08-14

## 2017-08-14 MED ORDER — DONEPEZIL HCL 5 MG PO TABS
5.00 | ORAL_TABLET | ORAL | Status: DC
Start: ? — End: 2017-08-14

## 2017-08-14 MED ORDER — OXYCODONE HCL 5 MG PO TABS
5.00 | ORAL_TABLET | ORAL | Status: DC
Start: ? — End: 2017-08-14

## 2017-08-14 MED ORDER — AMLODIPINE BESYLATE 5 MG PO TABS
5.00 | ORAL_TABLET | ORAL | Status: DC
Start: 2017-08-14 — End: 2017-08-14

## 2017-08-14 NOTE — Telephone Encounter (Signed)
Received Resumption of Care Home Health Certification and Plan of Care; forwarded to provider/SLS 05/31

## 2017-08-15 NOTE — Telephone Encounter (Signed)
I called patient on Wednesday and spoke to her.  I discussed Shermar's case with a few of my colleagues  including Dr. Mayer Camel at [facility]. This is consistent with heterotopic ossification and unfortunately it needs to mature before you can do something surgically for it if he's even a surgical candidate with his history of CVAs. It takes over a year from the start of symptoms (and he's maybe 8 months in) before it does. So we are going to refer him to pain management in the meantime. I'm going to touch base with his nephrologist too to see if there's any medications we can do in the meantime aside from his gabapentin for pain that will work for him along with his oxycodone, topical medications.

## 2017-08-18 ENCOUNTER — Telehealth: Payer: Self-pay | Admitting: Medical

## 2017-08-18 NOTE — Telephone Encounter (Signed)
Copied from [place] [id number]. Topic: Quick Communication - See Telephone Encounter >> Aug 18, 2017  3:57 PM Keene Breath wrote: CRM for notification. See Telephone encounter for: 08/18/17.  Requesting VO to continue home health PT effective next week, 2 x week for 2 week, 1 x week for 1 week for recert.  CB # [id number].

## 2017-08-19 NOTE — Telephone Encounter (Signed)
Ok PT. Give them verbal authorization.

## 2017-08-20 NOTE — Telephone Encounter (Signed)
Verbal orders given  

## 2017-08-21 ENCOUNTER — Telehealth: Payer: Self-pay | Admitting: *Deleted

## 2017-08-21 ENCOUNTER — Telehealth: Payer: Self-pay | Admitting: Family Medicine

## 2017-08-21 NOTE — Telephone Encounter (Signed)
Received Home Health Certification and Plan of Care; forwarded to provider/SLS 06/07 

## 2017-08-21 NOTE — Telephone Encounter (Signed)
Patient's wife calling with concerns  Patient is scheduled with a pain management clinic Tuesday, 6/11. Wife wants to know why he is going to a pain clinic. She asked if his problem is able to be fixed or only managed.   Patient's wife wants the condition fixed and asked if they need to be referred to a surgeon in order to do this

## 2017-08-21 NOTE — Telephone Encounter (Signed)
Dr. Barbaraann Barthel answered question on if their is a surgical solution. Has patient seen pain specialist. Does he have appointment upcoming?  Is Dr Barbaraann Barthel still working this afternoon.He wrote the last prescription of oxycodone. Sometimes if pain specialist review controlled med database and it they see various providers then it becomes issue with patient being accepted by pain management.  So can you call upstairs and see if Dr. Doren Custard in and willing to write him a prescription. How many tablets does pt have left. Doe he have enough to last him until Monday?

## 2017-08-21 NOTE — Telephone Encounter (Signed)
Patient's wife was informed and verbalized understanding.

## 2017-08-21 NOTE — Telephone Encounter (Signed)
Received Physician Orders from [facility]; forwarded to provider/SLS 06/07

## 2017-08-21 NOTE — Telephone Encounter (Signed)
Surgery would not fix this right now.  You have to wait for the bone deposition and inflammation to stop before any of it could be removed.  So his pain will have to be managed in the meantime until it can be fixed.

## 2017-08-21 NOTE — Telephone Encounter (Signed)
Copied from [place] [id number]. Topic: General - Other >> Aug 21, 2017 12:08 PM Marin Olp L wrote: Reason for CRM: Wife calling to find out if her husbands hip pain can be fixed? She says his MRI was completed and the patient was referred to pain management. His pain medications are running out.

## 2017-08-24 MED ORDER — AMLODIPINE BESYLATE 5 MG PO TABS
10.00 | ORAL_TABLET | ORAL | Status: DC
Start: 2017-09-01 — End: 2017-08-24

## 2017-08-24 MED ORDER — LIDOCAINE 5 % EX PTCH
1.00 | MEDICATED_PATCH | CUTANEOUS | Status: DC
Start: 2017-09-01 — End: 2017-08-24

## 2017-08-24 MED ORDER — HEPARIN SODIUM (PORCINE) 5000 UNIT/ML IJ SOLN
5000.00 | INTRAMUSCULAR | Status: DC
Start: 2017-08-31 — End: 2017-08-24

## 2017-08-24 MED ORDER — ATORVASTATIN CALCIUM 40 MG PO TABS
40.00 | ORAL_TABLET | ORAL | Status: DC
Start: 2017-08-31 — End: 2017-08-24

## 2017-08-24 MED ORDER — CLOPIDOGREL BISULFATE 75 MG PO TABS
75.00 | ORAL_TABLET | ORAL | Status: DC
Start: 2017-09-01 — End: 2017-08-24

## 2017-08-24 MED ORDER — RANITIDINE HCL 150 MG PO TABS
150.00 | ORAL_TABLET | ORAL | Status: DC
Start: 2017-08-31 — End: 2017-08-24

## 2017-08-24 MED ORDER — SERTRALINE HCL 25 MG PO TABS
50.00 | ORAL_TABLET | ORAL | Status: DC
Start: 2017-08-26 — End: 2017-08-24

## 2017-08-24 MED ORDER — CARVEDILOL 3.125 MG PO TABS
3.13 | ORAL_TABLET | ORAL | Status: DC
Start: 2017-09-01 — End: 2017-08-24

## 2017-08-24 NOTE — Telephone Encounter (Signed)
Dr. Barbaraann Barthel,  Thanks for seeing Mr. Rubens. His situation is challenging and appreciate your effort and help. You had consulted with orthopedist and his nephrologist. Did they think oxycodone appropriate in light of his situation/diagnosis.  Were you willing to write the oxycodone or do you want me to? I understand pain management needed now and possible surgery later.(if possible with his underlying conditions/diagnosis?)  You mentioned referral to pain management. Until he gets in with them little hesitant to write oxy as sometimes pain specialist will run Narx profile and see various prescribers and deny referral thinking pt is drug seeker which is obviously not case with Mr. Carneiro.  I can go ahead and refer him to pain management if you have not.  Will you be willing to give him refill percocet or do you want to?  In how many months will patient be able to have surgery if that is best course of action?  Thanks. Mackie Pai, PA-C

## 2017-08-24 NOTE — Telephone Encounter (Signed)
Hilary Hertz -   So here's where things stand with Mr. Court.  I'll write the oxycodone until he gets in with pain management.  I'm not sure if she's getting overwhelmed with the amount of information we and [facility] have given to her.  She should be contacting me about the pain medication - I'll have our office call her about the refills and to let her know to contact us.    We have put in a referral to pain management last week.  Nevin Bloodgood called around to different offices and found one that thought they could get him in within 2-3 weeks.    The concern is if he's even a surgical candidate given his other medical issues.  But the hip surgeon recommended minimum wait of 1 year from start of symptoms then doing a bone scan to make sure there's no active signal that would indicate ongoing deposition of bone.  Then he could potentially have surgery.  So that's still about 4 months away.  Dr. Barbaraann Barthel

## 2017-08-24 NOTE — Telephone Encounter (Signed)
Called patient. Wife states patient was taken to [facility] on yesterday due to hip pain. States she was told patient had fused joint and calcium deposits. States she was also told patietn can have hip surgery once the inflammation subsides however no one will give patient ant- inflammatory. States she is frustrated. Wife states she does not know whether they are going to admit patient or not at this point. Advised wife I would forward information to PCP. E. Saguier,PA-C.

## 2017-08-24 NOTE — Telephone Encounter (Signed)
Dr. Barbaraann Barthel,  Thanks again for your help and update.  Percell Miller

## 2017-08-25 MED ORDER — ERYTHROMYCIN 5 MG/GM OP OINT
TOPICAL_OINTMENT | OPHTHALMIC | Status: DC
Start: 2017-08-27 — End: 2017-08-25

## 2017-08-25 MED ORDER — MOXIFLOXACIN HCL 0.5 % OP SOLN
1.00 | OPHTHALMIC | Status: DC
Start: 2017-08-27 — End: 2017-08-25

## 2017-08-26 ENCOUNTER — Telehealth: Payer: Self-pay | Admitting: *Deleted

## 2017-08-26 MED ORDER — GABAPENTIN 300 MG PO CAPS
300.00 | ORAL_CAPSULE | ORAL | Status: DC
Start: 2017-08-31 — End: 2017-08-26

## 2017-08-26 MED ORDER — SERTRALINE HCL 25 MG PO TABS
25.00 | ORAL_TABLET | ORAL | Status: DC
Start: 2017-09-01 — End: 2017-08-26

## 2017-08-26 MED ORDER — DONEPEZIL HCL 10 MG PO TABS
10.00 | ORAL_TABLET | ORAL | Status: DC
Start: 2017-08-31 — End: 2017-08-26

## 2017-08-26 MED ORDER — QUETIAPINE FUMARATE 25 MG PO TABS
12.50 | ORAL_TABLET | ORAL | Status: DC
Start: 2017-08-31 — End: 2017-08-26

## 2017-08-26 NOTE — Telephone Encounter (Signed)
Received Missed visit Report from [facility]; forwarded to provider/SLS 06/12

## 2017-08-27 NOTE — Telephone Encounter (Signed)
Placing this in the chart as well (was a staff message from a few days ago):  He's been admitted to [facility] for altered mental status and possible corneal ulceration/abrasion. Obviously I'm concerned with the oxycodone that this may have contributed to this. I'd recommend we hold this for now, see how the hospitalists treat his pain and their plans for discharge before we refill the medication.

## 2017-10-12 ENCOUNTER — Telehealth: Payer: Self-pay | Admitting: Medical

## 2017-10-12 NOTE — Telephone Encounter (Signed)
[place] with verbal order PT. Clarify details of order(note says 2 week 5)

## 2017-10-12 NOTE — Telephone Encounter (Signed)
Copied from [place] [phone]. Topic: Quick Communication - See Telephone Encounter >> Oct 12, 2017  4:02 PM Nils Flack wrote: CRM for notification. See Telephone encounter for: 10/12/17. Dan Europe from [facility] called [phone] Verbal order for PT  2 week 5 Effective 10/11/17 Ok to leave message

## 2017-10-13 ENCOUNTER — Telehealth: Payer: Self-pay

## 2017-10-13 NOTE — Telephone Encounter (Signed)
Ok to give verbal for PT OT and speech.

## 2017-10-13 NOTE — Telephone Encounter (Signed)
Copied from [place] [phone]. Topic: Inquiry >> Oct 13, 2017 11:20 AM Pricilla Handler wrote: Reason for CRM: Nicolette with [facility] [phone]) called requesting verbal orders for Skilled Nursing and Therapy. Requested is: Skilled Nursing Once a Week for 3 Weeks, Once every other week for 6 Weeks, and 3 PRN Visits. PT, OT, And Speech Evaluations. Please call Nicolette with [facility] at [phone].       Thank You!!!

## 2017-10-13 NOTE — Telephone Encounter (Signed)
Order given to [address].

## 2017-10-13 NOTE — Telephone Encounter (Signed)
Call and gave verbal orders to Nicolette.

## 2017-10-15 ENCOUNTER — Ambulatory Visit (INDEPENDENT_AMBULATORY_CARE_PROVIDER_SITE_OTHER): Payer: Medicare Other | Admitting: Medical

## 2017-10-15 ENCOUNTER — Telehealth: Payer: Self-pay | Admitting: *Deleted

## 2017-10-15 ENCOUNTER — Encounter: Payer: Self-pay | Admitting: Medical

## 2017-10-15 VITALS — BP 92/55 | HR 81 | Temp 97.5°F | Resp 16 | Ht 69.0 in | Wt 120.6 lb

## 2017-10-15 DIAGNOSIS — I10 Essential (primary) hypertension: Secondary | ICD-10-CM

## 2017-10-15 DIAGNOSIS — Z862 Personal history of diseases of the blood and blood-forming organs and certain disorders involving the immune mechanism: Secondary | ICD-10-CM

## 2017-10-15 DIAGNOSIS — I5022 Chronic systolic (congestive) heart failure: Secondary | ICD-10-CM | POA: Diagnosis not present

## 2017-10-15 DIAGNOSIS — N186 End stage renal disease: Secondary | ICD-10-CM

## 2017-10-15 DIAGNOSIS — M25551 Pain in right hip: Secondary | ICD-10-CM

## 2017-10-15 MED ORDER — CLOPIDOGREL BISULFATE 75 MG PO TABS: ORAL_TABLET | ORAL | 11 refills | Status: DC

## 2017-10-15 MED ORDER — QUETIAPINE FUMARATE 25 MG PO TABS: 25.0000 mg | ORAL_TABLET | Freq: Three times a day (TID) | ORAL | 3 refills | Status: DC

## 2017-10-15 MED ORDER — ATORVASTATIN CALCIUM 80 MG PO TABS: 80.0000 mg | ORAL_TABLET | Freq: Every day | ORAL | 11 refills | Status: DC

## 2017-10-15 MED ORDER — CARVEDILOL 3.125 MG PO TABS: 3.1250 mg | ORAL_TABLET | Freq: Two times a day (BID) | ORAL | 11 refills | Status: AC

## 2017-10-15 MED ORDER — DONEPEZIL HCL 10 MG PO TABS: 10.0000 mg | ORAL_TABLET | Freq: Every day | ORAL | 3 refills | Status: DC

## 2017-10-15 MED ORDER — GABAPENTIN 300 MG PO CAPS: ORAL_CAPSULE | ORAL | 3 refills | Status: AC

## 2017-10-15 MED ORDER — GABAPENTIN 100 MG PO CAPS: 100.0000 mg | ORAL_CAPSULE | Freq: Two times a day (BID) | ORAL | 2 refills | Status: AC

## 2017-10-15 MED ORDER — SERTRALINE HCL 25 MG PO TABS: ORAL_TABLET | ORAL | 11 refills | Status: DC

## 2017-10-15 NOTE — Telephone Encounter (Signed)
Received Physician Orders from [facility] via [place] for DME [facility]; forwarded to provider/SLS 08/01

## 2017-10-15 NOTE — Progress Notes (Signed)
Subjective:    Patient ID: [name], male    DOB: 1945/04/24, 72 y.o.   MRN: [id number]  HPI  Pt in for follow up.   He has history of chf, ESRD(dialysis 3 times a week),htn, CAD, gastrointestinal bleed, diabetes,  Anemia  and stroke. Since last seen he had corneal abrasion. Also he has episodes of altered mental status in the past.   He also has severe hip pain as described by Dr. Barbaraann Barthel. This is consistent with heterotopic ossification and unfortunately it needs to mature before you can do something surgically for it if he's even a surgical candidate with his history of CVAs. It takes over a year from the start of symptoms (and he's maybe 8 months in) before it does. So we are going to refer him to pain management in the meantime. I'm going to touch base with his nephrologist too to see if there's any medications we can do in the meantime aside from his gabapentin for pain that will work for him along with his oxycodone, topical medications.  Just this week  I got request to give verbal for PT, OT and Speech therapy.(I ok'd that order)  Presently he feels good. He just got out of nursing home recently. He has appointment this afternoon.  Pt wife he using lidocaine pain patch and blue emu cream. Pain diminished but not resolved. He has appointment with pain specialist on October 29, 2017.   Pt mental status is good. He is not confused and pleasant.   No black stools. Pt has some loose stools but only 2 a day. This has been going on for 2 weeks.   Pt bp initially low when MA checked.   Pt is able to walk with walker. Sometimes uses cane.   Pt hb that I can find in epic mid summer done at [facility] showed hb 11. But can't find recent lab.     Review of Systems  Constitutional: Negative for chills, fatigue and fever.  Respiratory: Negative for cough, chest tightness, shortness of breath and wheezing.   Gastrointestinal: Negative for abdominal distention, abdominal pain and  blood in stool.       2 mild loose stool a day. Not describing severe diarrhea.(will watch and see if worsens)  Musculoskeletal: Negative for back pain.  Skin: Negative for pallor.  Neurological: Negative for dizziness, seizures, syncope, weakness, light-headedness, numbness and headaches.  Hematological: Negative for adenopathy. Does not bruise/bleed easily.  Psychiatric/Behavioral: Negative for behavioral problems, confusion, self-injury and suicidal ideas. The patient is not nervous/anxious.        Objective:   Physical Exam  General Mental Status- Alert. General Appearance- Not in acute distress.   Skin General: Color- Normal Color. Moisture- Normal Moisture.  Neck Carotid Arteries- Normal color. Moisture- Normal Moisture. No carotid bruits. No JVD.  Chest and Lung Exam Auscultation: Breath Sounds:-Normal.  Cardiovascular Auscultation:Rythm- Regular. Murmurs & Other Heart Sounds:Auscultation of the heart reveals- No Murmurs.  Abdomen Inspection:-Inspeection Normal. Palpation/Percussion:Note:No mass. Palpation and Percussion of the abdomen reveal- Non Tender, Non Distended + BS, no rebound or guarding.    Neurologic Cranial Nerve exam:- CN III-XII intact(No nystagmus), symmetric smile. Drift Test:- No drift. Romberg Exam:- Negative.  Heal to Toe Gait exam:-Normal. Finger to Nose:- Normal/Intact Strength:- 5/5 equal and symmetric strength both upper and lower extremities.      Assessment & Plan:  [phone] nephrologist  [phone] dialysis center  Overall [place] looks good today.  However his blood pressure is lower  than it has been recently.  With his history of former GI bleed, I want to get CBC and metabolic panel.  Results should be back by later today or tomorrow.  If he has significant decrease in his blood volume then might need to advise evaluation in ED for transfusion.  This will just depend on if he is anemic and and to what degree.  If blood  volume looks good then might need to adjust blood pressure meds.  History of CHF but no indicators of CHF presently on exam.  No swelling at all of his legs.  Lungs are clear.  For end-stage renal disease, continue with dialysis.  I did talk with dialysis center and labs will be drawn until next Wednesday. So getting cmp today with cbc.  For your right hip pain, continue with lidocaine patch, emu cream and pain management in mid August.  Follow-up date to be determined after lab review.  Refilled meds today according to how he had been using at nursing home.

## 2017-10-15 NOTE — Addendum Note (Signed)
Addended by: Caffie Pinto on: 10/15/2017 04:13 PM   Modules accepted: Orders

## 2017-10-15 NOTE — Patient Instructions (Addendum)
Overall [name] looks good today.  However his blood pressure is lower than it has been recently.  With his history of former GI bleed, I want to get CBC and metabolic panel.  Results should be back by later today or tomorrow.  If he has significant decrease in his blood volume then might need to advise evaluation in ED for transfusion.  This will just depend on if he is anemic and and to what degree.  If blood volume looks good then might need to adjust blood pressure meds.  History of CHF but no indicators of CHF presently on exam.  No swelling at all of his legs.  Lungs are clear.  For end-stage renal disease, continue with dialysis.  I did talk with dialysis center and labs will be drawn until next Wednesday. So getting cmp today with cbc.  For your right hip pain, continue with lidocaine patch, emu cream and pain management in mid August.  Follow-up date to be determined after lab review.  Refilled meds today according to how he had been using at nursing home.   Waiting on refilling amlodipine as I might adjust this dose after reviewing labs.

## 2017-10-16 ENCOUNTER — Inpatient Hospital Stay: Payer: Self-pay | Admitting: Medical

## 2017-10-19 ENCOUNTER — Telehealth: Payer: Self-pay | Admitting: *Deleted

## 2017-10-19 NOTE — Telephone Encounter (Signed)
Received notice from [facility], patient missed visit this week, refused; forwarded to provider/SLS 08/05

## 2017-10-20 ENCOUNTER — Other Ambulatory Visit: Payer: Medicare Other

## 2017-10-23 ENCOUNTER — Telehealth: Payer: Self-pay | Admitting: *Deleted

## 2017-10-23 NOTE — Telephone Encounter (Signed)
Received Physician Orders from [facility]; forwarded to covering provider/SLS 08/09

## 2017-10-28 ENCOUNTER — Telehealth: Payer: Self-pay | Admitting: *Deleted

## 2017-10-28 NOTE — Telephone Encounter (Signed)
Received Lab Report results from [facility] via [place]; forwarded to provider/SLS 08/14

## 2017-10-29 ENCOUNTER — Telehealth: Payer: Self-pay | Admitting: Medical

## 2017-10-29 NOTE — Telephone Encounter (Signed)
Copied from [place] [phone]. Topic: Quick Communication - See Telephone Encounter >> Oct 29, 2017 12:04 PM Rutherford Nail, NT wrote: CRM for notification. See Telephone encounter for: 10/29/17. Jimmye Norman, RN with [facility] calling and states that they are having to cancel the patient's home visit appointment for today because they are unable to make contact with patient to schedule a time the visit today/ CB#: [phone]

## 2017-10-29 NOTE — Telephone Encounter (Signed)
Noted/aware

## 2017-11-10 ENCOUNTER — Other Ambulatory Visit: Payer: Self-pay

## 2017-11-10 ENCOUNTER — Ambulatory Visit (INDEPENDENT_AMBULATORY_CARE_PROVIDER_SITE_OTHER): Payer: Medicare Other | Admitting: Adult Health

## 2017-11-10 ENCOUNTER — Encounter: Payer: Self-pay | Admitting: Adult Health

## 2017-11-10 VITALS — BP 85/53 | HR 85 | Resp 20 | Ht 69.0 in | Wt 120.0 lb

## 2017-11-10 DIAGNOSIS — I10 Essential (primary) hypertension: Secondary | ICD-10-CM | POA: Diagnosis not present

## 2017-11-10 DIAGNOSIS — R413 Other amnesia: Secondary | ICD-10-CM

## 2017-11-10 DIAGNOSIS — E785 Hyperlipidemia, unspecified: Secondary | ICD-10-CM

## 2017-11-10 DIAGNOSIS — Z8673 Personal history of transient ischemic attack (TIA), and cerebral infarction without residual deficits: Secondary | ICD-10-CM

## 2017-11-10 NOTE — Patient Instructions (Signed)
Continue clopidogrel 75 mg daily  and lipitor  for secondary stroke prevention  Continue to follow up with PCP regarding blood pressure and cholesterol management   Follow with pain management regarding continued hip pain along with home PT  Continue to monitor blood pressure at home  Maintain strict control of hypertension with blood pressure goal below 130/90, diabetes with hemoglobin A1c goal below 6.5% and cholesterol with LDL cholesterol (bad cholesterol) goal below 70 mg/dL. I also advised the patient to eat a healthy diet with plenty of whole grains, cereals, fruits and vegetables, exercise regularly and maintain ideal body weight.  Followup in the future with me as needed or call earlier if needed       Thank you for coming to see [place] at [facility]. I hope we have been able to provide you high quality care today.  You may receive a patient satisfaction survey over the next few weeks. We would appreciate your feedback and comments so that we may continue to improve ourselves and the health of our patients.

## 2017-11-10 NOTE — Progress Notes (Signed)
[facility] [address] [place]. [place] [address] [phone]       OFFICE FOLLOW-UP NOTE  [name] Date of Birth:  1945/12/01 Medical Record Number:  [id number]   Chief Complaint  Patient presents with  . History of CVA 05/2016    Denies new stroke sx. and reports compliance with Plavix. Wife sts. she thought the increased dose of Aricept helped at first, but not surre now. Sts. pt. still has periods of confusion and sometimes has outbursts of frustration, but is not aggressive/fim     HPI: Initial office visit 08/21/2016 : 61 year African-American male seen today for first office follow-up visit following hospital admission for lacunar infarct in March 2018. History is obtained from the patient, review of electronic medical records and have personally reviewed imaging films. [name] is a 72 y.o. male he reports that his right leg has not been working very well for the past 2 weeks. He also describes that it feels slightly numb, but I'm not sure if he means numb or weak because he describes numbness as "it is not working right." Initially a code stroke was called because he is a very poor historian and when asked when he was last normal, he initially reports 6 AM. I then ask him when things started and he again reports 6 AM. I then asked when it was last completely normal, and he reported 2 weeks ago. He has been having progressive difficulty with walking since then.He was seen at dialysis were hemoglobin was measured and was found to before. 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 tpa given?: no, out of window. CT scan of the head showed no acute abnormality. MRI showed punctate acute infarcts along the lateral margin of the right thalamus. MRA of the brain showed no large vessel stenosis. Thoracic echo showed diminished ejection fraction of 45-50% with diffuse  hypokinesis.. Carotid ultrasound showed no significant except been stenosis. LDL cholesterol 69 mg percent. Hemoglobin A1c was 4.9. He was seen by physical occupational therapy and started on Plavix for stroke prevention. He states his done well since discharge. His right leg continues to be weak. Did have a previous stroke in 2017 which left him with residual right-sided weakness on that she has not fully recovered. Update 01/07/2017 (PS) : he returns for follow-up after last visit 4 months ago. He and his wife called requesting this visit because of new problems. Patient states she's had memory difficulties and cognitive decline ever since last 6 months and the symptoms to proceed. He forgets recent information and at times has trouble Reading sentences and finding words. He knows what he wants to say but wants to come out easily. He is also requiring more help to activities like buttoning his clothes and not himself. He does use a cane but states his balance is poor and was frequently. Patient says he has not discussed this with his primary physician and has not had any recent lab work or brain imaging studies done. The patient is not been taking aspirin despite history of stroke due to history of GI bleeding. His blood pressure usually tends to run low sided today it is 88/56. He is tolerating Crestor well without muscle aches or pains. He denies any significant head injury with loss of consciousness, seizures, new strokes or TIA symptoms  07/07/17 visit: Since previous visit, patient was admitted  on 02/2017 for memory decline and agitation.  CT showed left temporal small SDH and right posterior fossa hygroma.  MRI was negative, EEG was negative.  Patient was diagnosed with encephalopathy with possible seizure activity.  Was put on Depakote.  However, patient did not fill the medication after discharge.  Patient returned to hospital on 03/20/2017 for altered mental status.  Incidental stroke was found in the  left cerebellum which was secondary to small vessel disease in the setting of hypotension.  EEG negative for seizure activity.  MRA and carotid Dopplers were negative.  LDL 135 A1c 5.3.  Patient was not on antithrombotic prior to this admission and it was recommended the patient start aspirin 325 mg daily.  Also recommended continue Lipitor 80 mg at discharge for cholesterol control.  It was determined that altered mental status could be multifactorial including vascular dementia, end-stage renal disease, deconditioning and hypotension.  Altered mental status did improve after IV fluids.  Hospice referral was recommended but family/POA have requested continued aggressive care and hospice plans were canceled. Recommended that patient continue Depakote 500mg  BID. Patient was discharged to long-term care facility.   Since hospital discharge, patient has been residing at the rehab center at [facility] where he does get daily PT/OT/ST.  Continuing HD on M/W/F through a fistula in his left arm.  Family is concerned about recent memory decline and being verbally and physically abusive towards family.  MMSE today 12/30.  Patient does have Seroquel and Xanax prescribed and daughter states that he does get very lethargic with 1 of those medications but is unsure which one.  Patient has complaints about right leg pain/burning sensation that radiates from his hip down to his ankle and burning sensation on the sacrum.  Currently takes gabapentin 100 mg 3 times daily which patient tolerates okay without side effects.  Recommendation from neurology to be discharged on aspirin 325 but order was not placed at discharge.  Patient continues to take Lipitor without side effects of myalgias.  Denies new or worsening stroke/TIA symptoms.  08/06/17 UPDATE: [name] returns today for one-month follow-up and is accompanied by his daughter.  At previous appointment, patient was started on Aricept 5 mg nightly for worsening memory loss.   Daughter states patient is tolerating this well without GI upset or any other side effects.  Daughter believes that memory continues to worsen.  She also has concerns of sundowning agitation.  Patient recently hospitalized on 07/16/2017 for anemia requiring transfusion secondary to AVMs with complaints of black tarry stools for the past few days.  During admission, patient's family requested removal of PEG tube and this was removed by GI.  Patient was discharged on Plavix and instructed to stop aspirin.  Since this hospitalization, patient denies bleeding or bruising.  Does have complaints of increased right hip pain that radiates down his leg.  Patient was complaining of this at previous appointment but not to this extent.  He is currently sitting in wheelchair and is unable to ambulate.  Frequent grimacing due to intense pain.  Patient has MRI hip scheduled for this Saturday, 08/08/2017.  Patient also states that he feels numbness and tingling in this leg.  At previous appointment, gabapentin was increased but due to kidney function and creatinine clearance of 4 per original Cockcroft-Gault calculator, gabapentin is unable to be increased further.  After MRI, Cymbalta can be considered for additional nerve pain management.  Patient has been taking oxycodone-acetaminophen 7.5-325 mg as needed for pain but per daughter,  he has been taking this 1-2 times daily  Interval history 11/10/2017: Patient returns today for follow-up visit.  Patient did have MRI right hip on 08/08/2017 that showed areas of calcification of right iliopsoas and left gluteal and pyriformis muscle.  He was prescribed tramadol but after running out of this medication with worsening right hip pain, wife brought him to [facility] for further evaluation on 08/13/2017.  Patient was evaluated by orthopedics to evaluate MRI images and possible biopsy of calcifications however per notes, the pain was likely secondary to autofused right hip.   Patient was discharged home in stable condition.  At today's appointment, patient continues to complain of right hip pain.  Per wife, he does have an appointment with pain specialist on 11/19/2017.  He also has been using lidocaine patch with only mild relief.  Continues to take Plavix without side effects of bleeding or bruising.  Continues to take Lipitor without side effects myalgias.  Blood pressure today 85/53 which is normal for patient.  This is monitored with home physical therapy that he participates in 2 times per week due to continued hip pain.  MMSE performed today and scored 14/30 (previous 12/30).  Denies new or worsening stroke/TIA symptoms.  ROS:   14 system review of systems is positive for joint pain, walking difficulty, speech difficulty, confusion and depression and all other systems negative  PMH:  Past Medical History:  Diagnosis Date  . 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 Anemia, iron deficiency 03/24/2011   a. Recurrent GI bleed, tx with periodic iron infusions.  . 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 AVM (arteriovenous malformation) of duodenum, acquired    egds in 01/2012, 04/2011  . BPH (benign prostatic hyperplasia)   . CAD (coronary artery disease)    a. Cath 09/2012: moderate borderline CAD in mid LAD/small diagonal branch, mild RCA stenosis, to be managed medically   . Chronic lower back pain   . Diabetic peripheral neuropathy ([place]) 2014   foot pain.  . Essential hypertension 02/01/2012  . Fatty liver    on CT of 11/2010  . GERD (gastroesophageal reflux disease)   . GI bleed    a. Recurrent GI bleed, tx with IV iron. b. Per heme notes - likely AVMs 04/2012 (tx with cauterization several months ago).  . Hematuria    a. Urology note scan from 07/2012: cystoscopy without evidence for bladder lesion, only lateral hypertrophy of posterior urethra, bladder impression from BPH. b. Pt states he had "some tests" scheduled for later in July 2014.  . High  cholesterol   . Hyperlipidemia 02/01/2012  . Hypertension   . Hypertensive urgency 07/06/2016  . 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 Orthostatic hypotension    a. Tx with florinef.  . Peripheral neuropathy   . Polyp, colonic    Colonoscopy 01/2012 "benign" polyp  . Prostate cancer ([place])   . Sleep apnea    "had mask; couldn't sleep in it" (09/20/2012)  . Smoker   . SOB (shortness of breath) 07/06/2016  . Stage III chronic kidney disease ([name])    a. Stage 3 (DM with complications ->CKD, peripheral neuropathy).  . Stroke ([place])    x 2  . Symptomatic anemia 04/03/2015  . Tinea pedis 05/24/2012  . Tobacco use 02/01/2012  . Type 2 diabetes, uncontrolled, with neuropathy ([place]) 02/01/2012  . Type II diabetes mellitus ([place])    a. Dx 1994, uncontrolled.     Social History:  Social History  Socioeconomic History  . Marital status: Married    Spouse name: Not on file  . Number of children: 2  . Years of education: Not on file  . Highest education level: Not on file  Occupational History  . Occupation: taken out of work due to back  Social Needs  . Financial resource strain: Not on file  . Food insecurity:    Worry: Not on file    Inability: Not on file  . Transportation needs:    Medical: Not on file    Non-medical: Not on file  Tobacco Use  . Smoking status: Former Smoker    Packs/day: 0.50    Years: 30.00    Pack years: 15.00    Types: Cigarettes    Start date: 04/15/1968    Last attempt to quit: 08/06/2016    Years since quitting: 1.2  . Smokeless tobacco: Never Used  . Tobacco comment: start back a couple of months ago  Substance and Sexual Activity  . Alcohol use: No    Alcohol/week: 0.0 standard drinks    Comment: 09/20/2012 "Used to; stopped ~ 2009; never had problem w/it"  . Drug use: No  . Sexual activity: Not on file  Lifestyle  . Physical activity:    Days per week: Not on file    Minutes per session: Not on file  . Stress: Not on file    Relationships  . Social connections:    Talks on phone: Not on file    Gets together: Not on file    Attends religious service: Not on file    Active member of club or organization: Not on file    Attends meetings of clubs or organizations: Not on file    Relationship status: Not on file  . Intimate partner violence:    Fear of current or ex partner: Not on file    Emotionally abused: Not on file    Physically abused: Not on file    Forced sexual activity: Not on file  Other Topics Concern  . Not on file  Social History Narrative   Regular exercise: rides horses   Caffeine use: occasionally    Medications:   Current Outpatient Medications on File Prior to Visit  Medication Sig Dispense Refill  . atorvastatin (LIPITOR) 80 MG tablet Take 1 tablet (80 mg total) by mouth at bedtime. 30 tablet 11  . carvedilol (COREG) 3.125 MG tablet Take 1 tablet (3.125 mg total) by mouth 2 (two) times daily with a meal. 60 tablet 11  . clopidogrel (PLAVIX) 75 MG tablet 1 tab  PO QD FOR ANTICOAGULANT 30 tablet 11  . donepezil (ARICEPT) 10 MG tablet Take 1 tablet (10 mg total) by mouth at bedtime. 90 tablet 3  . gabapentin (NEURONTIN) 100 MG capsule Take 1 capsule (100 mg total) by mouth 2 (two) times daily. 60 capsule 2  . gabapentin (NEURONTIN) 300 MG capsule 1 tab po q hs Monday, wed, Friday(dialyis day) 12 capsule 3  . multivitamin (RENA-VIT) TABS tablet Take 1 tablet by mouth every Monday, Wednesday, and Friday.     [name] Kitchen QUEtiapine (SEROQUEL) 25 MG tablet Take 1 tablet (25 mg total) by mouth 3 (three) times daily. 90 tablet 3  . sertraline (ZOLOFT) 25 MG tablet 2 tabs  PO QD FOR ANTI DEPRESSANT 30 tablet 11  . amLODipine (NORVASC) 10 MG tablet 1 tab PO QD FOR HTN 90 tablet 3   No current facility-administered medications on file prior to visit.  Allergies:   Allergies  Allergen Reactions  . Valproic Acid Other (See Comments)    Physical Exam General: Cachectic frail middle-aged  African-American male, seated, in no evident distress Head: head normocephalic and atraumatic.  Neck: supple with no carotid or supraclavicular bruits Cardiovascular: regular rate and rhythm, no murmurs Musculoskeletal: no deformity Skin:  no rash/petichiae Vascular:  Normal pulses all extremities Vitals:   11/10/17 1450  BP: (!) 85/53  Pulse: 85  Resp: 20   Neurologic Exam Mental Status: Awake and fully alert.  Mood and affect appropriate.  Previous MMSE apparently not recorded on her screening but per notes, on 07/07/2017 MMSE 12/30 MMSE - Mini Mental State Exam 07/07/2017 01/07/2017  Orientation to time 2 5  Orientation to Place 2 4  Registration 3 3  Attention/ Calculation 0 0  Attention/Calculation-comments - pt stated he cannot spell the letter world or count backwards  Recall 0 1  Language- name 2 objects 1 2  Language- repeat 1 1  Language- follow 3 step command 3 3  Language- read & follow direction 0 0  Language-read & follow direction-comments - pt states he cant read it happen a year ago  Write a sentence 0 0  Copy design 0 0  Total score 12 19    Cranial Nerves: Fundoscopic exam reveals sharp disc margins. Pupils equal, briskly reactive to light. Extraocular movements full without nystagmus. Visual fields full to confrontation. Hearing intact. Facial sensation intact. Face, tongue, palate moves normally and symmetrically.  Motor: Normal bulk and tone. Normal strength in all tested extremity muscles.  Unable to adequately test right leg due to pain Sensory.: intact to touch ,pinprick .position and vibratory sensation.  Coordination: Rapid alternating movements normal in all extremities.  Gait and Station: Patient currently sitting in wheelchair and is unable to ambulate  Due to right hip pain Reflexes: 2+ and symmetric. Toes downgoing.     ASSESSMENT: Urine balance 72 year old male with incidental finding of left cerebellar infarct on 03/2017 he was admitted with  altered mental status.  Vascular risk factors include previous strokes, CAD, DM, HTN, HLD, OSA, dementia and ESRD on dialysis.  Was recently seen in ED due to continued hip pain and will be being evaluated by pain specialist on 11/19/2017.  Patient returns today for follow-up visit and overall doing well from a stroke standpoint.   PLAN: -continue Plavix daily  and lipitor  for secondary stroke prevention -f/u with PCP for continued management of HTN, HLD and DM along with memory concerns -Continue home PT along with pain management appointment for continued hip pain -Recommended continuation of pressure monitoring at home -Maintain strict control of hypertension with blood pressure goal below 130/90, diabetes with hemoglobin A1c goal below 6.5% and cholesterol with LDL cholesterol (bad cholesterol) goal below 70 mg/dL. I also advised the patient to eat a healthy diet with plenty of whole grains, cereals, fruits and vegetables, exercise regularly and maintain ideal body weight.  Followup in the future with me as needed as patient is stable from stroke standpoint but advised to call with questions or concerns  Greater than 50% time during this 25 minute consultation visit was spent on counseling and coordination of care about HLD, DM, HTN, OSA, memory loss (risk factors), discussion about risk benefit of anticoagulation and answering questions.   Venancio Poisson, AGNP-BC  [facility] [address] [place], [place] [address]  Phone [phone] Fax [phone]

## 2017-11-13 NOTE — Progress Notes (Signed)
I agree with the above plan 

## 2017-11-18 MED ORDER — INSULIN LISPRO 100 UNIT/ML ~~LOC~~ SOLN
1.00 | SUBCUTANEOUS | Status: DC
Start: 2017-11-19 — End: 2017-11-18

## 2017-11-18 MED ORDER — GABAPENTIN 300 MG PO CAPS
300.00 | ORAL_CAPSULE | ORAL | Status: DC
Start: 2017-11-18 — End: 2017-11-18

## 2017-11-18 MED ORDER — ACETAMINOPHEN 325 MG PO TABS
650.00 | ORAL_TABLET | ORAL | Status: DC
Start: ? — End: 2017-11-18

## 2017-11-18 MED ORDER — CARVEDILOL 3.125 MG PO TABS
3.13 | ORAL_TABLET | ORAL | Status: DC
Start: 2017-11-18 — End: 2017-11-18

## 2017-11-18 MED ORDER — ATORVASTATIN CALCIUM 40 MG PO TABS
40.00 | ORAL_TABLET | ORAL | Status: DC
Start: ? — End: 2017-11-18

## 2017-11-18 MED ORDER — DONEPEZIL HCL 10 MG PO TABS
10.00 | ORAL_TABLET | ORAL | Status: DC
Start: 2017-11-18 — End: 2017-11-18

## 2017-11-18 MED ORDER — GUAIFENESIN-DM 100-10 MG/5ML PO SYRP
5.00 | ORAL_SOLUTION | ORAL | Status: DC
Start: ? — End: 2017-11-18

## 2017-11-18 MED ORDER — GENERIC EXTERNAL MEDICATION
300.00 | Status: DC
Start: 2017-11-19 — End: 2017-11-18

## 2017-11-18 MED ORDER — NALOXONE HCL 0.4 MG/ML IJ SOLN
0.40 | INTRAMUSCULAR | Status: DC
Start: ? — End: 2017-11-18

## 2017-11-18 MED ORDER — ONDANSETRON 4 MG PO TBDP
4.00 | ORAL_TABLET | ORAL | Status: DC
Start: ? — End: 2017-11-18

## 2017-11-18 MED ORDER — ALUMINUM-MAGNESIUM-SIMETHICONE 200-200-20 MG/5ML PO SUSP
30.00 | ORAL | Status: DC
Start: ? — End: 2017-11-18

## 2017-11-18 MED ORDER — SERTRALINE HCL 25 MG PO TABS
25.00 | ORAL_TABLET | ORAL | Status: DC
Start: 2017-11-19 — End: 2017-11-18

## 2017-11-18 MED ORDER — GLUCOSE 40 % PO GEL
15.00 g | ORAL | Status: DC
Start: ? — End: 2017-11-18

## 2017-11-18 MED ORDER — QUETIAPINE FUMARATE 25 MG PO TABS
12.50 | ORAL_TABLET | ORAL | Status: DC
Start: 2017-11-18 — End: 2017-11-18

## 2017-11-18 MED ORDER — GENERIC EXTERNAL MEDICATION
1.00 | Status: DC
Start: ? — End: 2017-11-18

## 2017-11-18 MED ORDER — DOCUSATE SODIUM 100 MG PO CAPS
100.00 | ORAL_CAPSULE | ORAL | Status: DC
Start: ? — End: 2017-11-18

## 2017-11-18 MED ORDER — GENERIC EXTERNAL MEDICATION
200.00 | Status: DC
Start: 2017-11-20 — End: 2017-11-18

## 2017-11-18 MED ORDER — DEXTROSE 10 % IV SOLN
125.00 | INTRAVENOUS | Status: DC
Start: ? — End: 2017-11-18

## 2017-11-18 MED ORDER — GENERIC EXTERNAL MEDICATION
300.00 | Status: DC
Start: ? — End: 2017-11-18

## 2017-11-18 MED ORDER — HYDRALAZINE HCL 20 MG/ML IJ SOLN
10.00 | INTRAMUSCULAR | Status: DC
Start: ? — End: 2017-11-18

## 2017-11-18 MED ORDER — ZOLPIDEM TARTRATE 5 MG PO TABS
5.00 | ORAL_TABLET | ORAL | Status: DC
Start: ? — End: 2017-11-18

## 2017-11-18 MED ORDER — ONDANSETRON HCL 4 MG/2ML IJ SOLN
4.00 | INTRAMUSCULAR | Status: DC
Start: ? — End: 2017-11-18

## 2017-11-19 ENCOUNTER — Telehealth: Payer: Self-pay | Admitting: Medical

## 2017-11-19 NOTE — Telephone Encounter (Signed)
Copied from [place] [id number]. Topic: Inquiry >> Nov 19, 2017  9:20 AM Scherrie Gerlach wrote: Reason for CRM: Kerry Dory with [facility] calling for verbal orders to resume care for skilled nursing and PT Pt was dc'd yesterday from [facility]  Nicolotte Brown (intake nurse to call to give verbal to) [phone]

## 2017-11-19 NOTE — Telephone Encounter (Signed)
Ok to give verbal orders. Please notify [employer]. Touch base  with pt when [place]  will follow up with me?

## 2017-11-20 ENCOUNTER — Telehealth: Payer: Self-pay | Admitting: Emergency Medicine

## 2017-11-20 ENCOUNTER — Ambulatory Visit: Payer: Medicare Other | Admitting: Medical

## 2017-11-20 NOTE — Telephone Encounter (Signed)
Copied from [place] [id number]. Topic: Inquiry >> Nov 19, 2017  9:20 AM Scherrie Gerlach wrote: Reason for CRM: Kerry Dory with [facility] calling for verbal orders to resume care for skilled nursing and PT Pt was dc'd yesterday from [facility]  Nicolotte Brown (intake nurse to call to give verbal to) [phone] >> Nov 19, 2017  4:45 PM Ivar Drape wrote: [facility] stated they have not received the orders.  Please fax to [phone] or call Nicolotte Brown (intake nurse to call to give verbal to) [phone]

## 2017-11-20 NOTE — Telephone Encounter (Signed)
Call to give orders phone was disconnected will try again later.

## 2017-11-20 NOTE — Telephone Encounter (Signed)
Please read message from [facility]

## 2017-11-23 ENCOUNTER — Telehealth: Payer: Self-pay | Admitting: Medical

## 2017-11-23 NOTE — Telephone Encounter (Signed)
Reviewed that patient was admitted for acute on chronic Gi bleed. Pt was scheduled to see me late Friday. I thought ED most appropiate as aware of pt GI history and importance of getting stat labs as we approach weekend. Stat labs were questionable at time he was scheduled to see me as our lab may have been closed. Pt wife notified and she chose to take him to [facility]. Pt was admitted.

## 2017-11-25 ENCOUNTER — Telehealth: Payer: Self-pay

## 2017-11-25 NOTE — Telephone Encounter (Signed)
Author phoned pt. To let him know that PCP approved a 340PM ED hospital f/u on 9/12. Appointment scheduled with wife over the phone.

## 2017-11-26 ENCOUNTER — Ambulatory Visit (INDEPENDENT_AMBULATORY_CARE_PROVIDER_SITE_OTHER): Payer: Medicare Other | Admitting: Medical

## 2017-11-26 ENCOUNTER — Encounter: Payer: Self-pay | Admitting: Medical

## 2017-11-26 VITALS — BP 111/66 | HR 73 | Temp 98.3°F | Resp 16

## 2017-11-26 DIAGNOSIS — D649 Anemia, unspecified: Secondary | ICD-10-CM | POA: Diagnosis not present

## 2017-11-26 DIAGNOSIS — I132 Hypertensive heart and chronic kidney disease with heart failure and with stage 5 chronic kidney disease, or end stage renal disease: Secondary | ICD-10-CM | POA: Diagnosis not present

## 2017-11-26 DIAGNOSIS — Z8719 Personal history of other diseases of the digestive system: Secondary | ICD-10-CM | POA: Diagnosis not present

## 2017-11-26 MED ORDER — GENERIC EXTERNAL MEDICATION
1.00 | Status: DC
Start: ? — End: 2017-11-26

## 2017-11-26 MED ORDER — GABAPENTIN 100 MG PO CAPS
100.00 | ORAL_CAPSULE | ORAL | Status: DC
Start: 2017-11-25 — End: 2017-11-26

## 2017-11-26 MED ORDER — GENERIC EXTERNAL MEDICATION
1.00 g | Status: DC
Start: 2017-11-25 — End: 2017-11-26

## 2017-11-26 MED ORDER — SERTRALINE HCL 25 MG PO TABS
25.00 | ORAL_TABLET | ORAL | Status: DC
Start: 2017-11-25 — End: 2017-11-26

## 2017-11-26 MED ORDER — CALCIUM ACETATE (PHOS BINDER) 667 MG PO CAPS
667.00 | ORAL_CAPSULE | ORAL | Status: DC
Start: 2017-11-24 — End: 2017-11-26

## 2017-11-26 MED ORDER — CARVEDILOL 3.125 MG PO TABS
3.13 | ORAL_TABLET | ORAL | Status: DC
Start: 2017-11-24 — End: 2017-11-26

## 2017-11-26 MED ORDER — GENERIC EXTERNAL MEDICATION
1.00 | Status: DC
Start: 2017-11-25 — End: 2017-11-26

## 2017-11-26 MED ORDER — ATORVASTATIN CALCIUM 40 MG PO TABS
40.00 | ORAL_TABLET | ORAL | Status: DC
Start: 2017-11-24 — End: 2017-11-26

## 2017-11-26 MED ORDER — GUAIFENESIN-DM 100-10 MG/5ML PO SYRP
5.00 | ORAL_SOLUTION | ORAL | Status: DC
Start: ? — End: 2017-11-26

## 2017-11-26 MED ORDER — PANTOPRAZOLE SODIUM 40 MG PO TBEC
40.00 | DELAYED_RELEASE_TABLET | ORAL | Status: DC
Start: 2017-11-25 — End: 2017-11-26

## 2017-11-26 MED ORDER — ACETAMINOPHEN 325 MG PO TABS
650.00 | ORAL_TABLET | ORAL | Status: DC
Start: ? — End: 2017-11-26

## 2017-11-26 MED ORDER — DONEPEZIL HCL 10 MG PO TABS
10.00 | ORAL_TABLET | ORAL | Status: DC
Start: 2017-11-24 — End: 2017-11-26

## 2017-11-26 MED ORDER — QUETIAPINE FUMARATE 25 MG PO TABS
12.50 | ORAL_TABLET | ORAL | Status: DC
Start: 2017-11-24 — End: 2017-11-26

## 2017-11-26 MED ORDER — ONDANSETRON HCL 4 MG/2ML IJ SOLN
4.00 | INTRAMUSCULAR | Status: DC
Start: ? — End: 2017-11-26

## 2017-11-26 MED ORDER — EPOETIN ALFA 10000 UNIT/ML IJ SOLN
10000.00 | INTRAMUSCULAR | Status: DC
Start: 2017-11-25 — End: 2017-11-26

## 2017-11-26 MED ORDER — DEXTROSE 10 % IV SOLN
125.00 | INTRAVENOUS | Status: DC
Start: ? — End: 2017-11-26

## 2017-11-26 MED ORDER — INSULIN LISPRO 100 UNIT/ML ~~LOC~~ SOLN
0.00 | SUBCUTANEOUS | Status: DC
Start: 2017-11-24 — End: 2017-11-26

## 2017-11-26 MED ORDER — GLUCOSE 40 % PO GEL
15.00 g | ORAL | Status: DC
Start: ? — End: 2017-11-26

## 2017-11-26 NOTE — Patient Instructions (Signed)
I am glad to hear that [name] feels better/has more energy and that he had procedure for the gastrointestinal bleed.  I do want to get CBC and metabolic panel today.  I want to confirm that his hemoglobin/hematocrit is stable compared to discharge date.  We will let you know results most likely by tomorrow morning.  You report he had a lot of retained fluid likely from IV hydration when he was hospitalized.  He has dialysis tomorrow and hopefully they will be able to draw off more fluid.  Keep GI appointment in October.  If I see significant drop in hemoglobin/hematocrit then we will try to notify GI MD and see if they can get you in sooner for follow-up.  Follow-up date to be determined after lab review

## 2017-11-26 NOTE — Addendum Note (Signed)
Addended by: Caffie Pinto on: 11/26/2017 06:23 PM   Modules accepted: Orders

## 2017-11-26 NOTE — Progress Notes (Signed)
Subjective:    Patient ID: [name], male    DOB: 1945/10/19, 72 y.o.   MRN: [id number]  HPI  Pt in for follow up.  I had asked them get seen by ED on past Friday  due to likely anemia from GI bleed. He had rectal bleeding/black stools late last week. When I saw he was on my schedule and knowing that  our lab would likely be closed I asked him to been ED.  I had concern that his GI  bleed might be recurrent. He went to [facility] ED  and found to be anemic.He was admitted to evaluate GI bleed.     Review of records done today. 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 He has had recurrent admissions for GI bleed and was recently admitted from 9/04/18/17 to 11/18/17 for acute of chronic anemia with coffee ground emesis. He underwent an EGD on 11/18/17 which did not reveal any active bleed. He was transfused 3 units of PRBC on 11/17/17. Hemoglobin at discharge was 10.  On 11/20/2017, patient underwent his regular dialysis. Hemoglobin was noted to be low at dialysis and 0.5. Wife also complained of dark black stools since discharge from the hospital 2 days ago. He was sent to ED. Evaluation in the ED revealed a hemoglobin of 6.1.  Hospital Course:  Acute on chronic GI bleeding -bleeding scan was obtained next day on 9/7 showed active bleeding in the duodenal area. 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 He needed epinephrine injection and 5 hemoclips. Hemostasis achieved.   Acute on chronic blood loss anemia -patient received a total of 5 units of PRBCs with subsequent improvement of hemoglobin A1c to 8.7 today. Plavix remains on hold at least for another week. Continue PPI.   Acute delirium - patient is more calm and cooperative today. His agitation for 1-2 days  after admission was likely secondary to anemia and not getting psych meds. Meds resumed. Patient's mental status is back to normal at this time. Continue Aricept, Zoloft and Seroquel. Because of altered mental status, UTI was suspected and patient was started on IV Rocephin empirically. Urine culture did not show any growth. UTI ruled out.   Type 2 diabetes mellitus with complication, without long-term current use of insulin -last hemoglobin A1c 5.19 September 2017. Patient was not on any medications at home.  Lab Results  Component Value Date  HBA1C 5.6 09/16/2017   Essential hypertensionwith episode of transient hypotension - blood pressure has remained in normal range after bleeding stopped. Continue Coreg. Amlodipine remains on hold. Okay to resume after discharge.   Hx of CVA - with residual speech defect as well as motor deficit on the right side. Plavix on hold. Continue statin. To resume after a week  ESRD on dialysis - MWF, is currently on dialysis.  Vascular dementia - No acute issues patient on donepezil  Physical deconditioning - Per wife, prior to admission, patient was at least able to stand take few steps with walker.  Stable for discharge to home today.  Post Discharge Follow Up Issues:  Follow-up with nephrology as an outpatient   Imagine studies explain source of bleed. Result Date: 11/22/2017 ADDENDUM REPORT: 11/22/2017 16:35 ADDENDUM: I called the results to Dr. Pietro Cassis on 11/21/2017 shortly after  signing off the first dictation. Electronically Signed By: Dorise Bullion III M.D On: 11/22/2017 16:35 CLINICAL DATA: GI bleeding. Melena and anemia. EXAM: NUCLEAR MEDICINE GASTROINTESTINAL BLEEDING SCAN TECHNIQUE: Sequential abdominal images were obtained following intravenous administration of Tc-55m labeled red blood cells. RADIOPHARMACEUTICALS: 27.0 mCi Tc-65m pertechnetate in-vitro labeled red cells. COMPARISON: None. FINDINGS: Bleeding began in the right upper quadrant of the  abdomen during the second hour of imaging. The bleed in peristalsis through the small bowel. IMPRESSION: GI bleed beginning in the right upper quadrant of the abdomen appears to be small bowel in origin. The findings are suspicious for a bleed beginning in the duodenum. A bleed beginning in the distal stomach, emptying into the small bowel is considered less likely given the lack of blood in the stomach. These results will be called to the ordering clinician or representative by the Radiologist Assistant, and communication documented in the PACS or zVision Dashboard. Electronically Signed: By: Dorise Bullion III M.D On: 11/21/2017 14:51   Or Fluoro Imaging  Result Date: 11/19/2017 This exam was automatically finalized and will not be resulted.  Ir Cvc Triple Lumen Non Tunnelled  Result Date: 11/17/2017 INDICATION: GI bleed, end-stage renal disease, known peripheral access EXAM: ULTRASOUND GUIDANCE FOR VASCULAR ACCESS LEFT IJ TEMPORARY TRIPLE-LUMEN CENTRAL LINE MEDICATIONS: 1% LIDOCAINE LOCAL ANESTHESIA/SEDATION: Moderate Sedation Time: 0 minutes. The patient's level of consciousness and vital signs were monitored continuously by radiology nursing throughout the procedure under my direct supervision. FLUOROSCOPY TIME: Fluoroscopy Time: 0 minutes 12 seconds (1 mGy). COMPLICATIONS: NONE. PROCEDURE: Informed written consent was obtained from the patient after a thorough discussion of the procedural risks, benefits and alternatives. All questions were addressed. Maximal Sterile Barrier Technique was utilized including caps, mask, sterile gowns, sterile gloves, sterile drape, hand hygiene and skin antiseptic. A timeout was performed prior to the initiation of the procedure. Under sterile conditions and local anesthesia, ultrasound micropuncture access performed of the patent left internal jugular vein. Images obtained for documentation. Guidewire advanced followed by the micro dilator set. Amplatz guidewire advanced  into the IVC. Tract dilatation performed to insert a triple-lumen central line. Tip at the SVC RA junction. Images obtained for documentation. Blood aspirated easily followed by saline and heparin flushes. Catheter secured with Prolene sutures and a sterile dressing. External caps applied. No immediate complication. Patient tolerated the procedure well.. IMPRESSION: Successful ultrasound and fluoroscopic left IJ temporary triple-lumen catheter insertion. Tip SVC RA junction. Ready for use. Electronically Signed By: Jerilynn Mages. Shick M.D. On: 11/17/2017 16:24   Acute on chronic GI bleeding -bleeding scan was obtained next day on 9/7 showed active bleeding in the duodenal area. 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 He needed epinephrine injection and 5 hemoclips. Hemostasis achieved.    Pt follow up appointment with GI is October 17 th.  Pt wife states his stool at times has black appearance but much less. Since procedure pt feels better. Wife states more energetic.   Review of Systems  Constitutional: Negative for chills, fatigue and fever.  Respiratory: Negative for cough, chest tightness, shortness of breath and wheezing.   Cardiovascular: Negative for chest pain and palpitations.  Gastrointestinal: Negative for abdominal pain.  Musculoskeletal: Negative for back pain.  Skin: Negative for rash.  Neurological: Negative for dizziness, weakness, numbness and headaches.  Hematological: Negative for adenopathy. Does not bruise/bleed easily.    Past Medical History:  Diagnosis Date  . 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 Anemia, iron  deficiency 03/24/2011   a. Recurrent GI bleed, tx with periodic iron infusions.  . 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 AVM (arteriovenous malformation) of duodenum, acquired    egds in 01/2012, 04/2011  . BPH (benign prostatic hyperplasia)   . CAD (coronary artery disease)    a. Cath 09/2012:  moderate borderline CAD in mid LAD/small diagonal branch, mild RCA stenosis, to be managed medically   . Chronic lower back pain   . Diabetic peripheral neuropathy ([place]) 2014   foot pain.  . Essential hypertension 02/01/2012  . Fatty liver    on CT of 11/2010  . GERD (gastroesophageal reflux disease)   . GI bleed    a. Recurrent GI bleed, tx with IV iron. b. Per heme notes - likely AVMs 04/2012 (tx with cauterization several months ago).  . Hematuria    a. Urology note scan from 07/2012: cystoscopy without evidence for bladder lesion, only lateral hypertrophy of posterior urethra, bladder impression from BPH. b. Pt states he had "some tests" scheduled for later in July 2014.  . High cholesterol   . Hyperlipidemia 02/01/2012  . Hypertension   . Hypertensive urgency 07/06/2016  . 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 Orthostatic hypotension    a. Tx with florinef.  . Peripheral neuropathy   . Polyp, colonic    Colonoscopy 01/2012 "benign" polyp  . Prostate cancer ([place])   . Sleep apnea    "had mask; couldn't sleep in it" (09/20/2012)  . Smoker   . SOB (shortness of breath) 07/06/2016  . Stage III chronic kidney disease ([name])    a. Stage 3 (DM with complications ->CKD, peripheral neuropathy).  . Stroke ([place])    x 2  . Symptomatic anemia 04/03/2015  . Tinea pedis 05/24/2012  . Tobacco use 02/01/2012  . Type 2 diabetes, uncontrolled, with neuropathy ([place]) 02/01/2012  . Type II diabetes mellitus ([place])    a. Dx 1994, uncontrolled.      Social History   Socioeconomic History  . Marital status: Married    Spouse name: Not on file  . Number of children: 2  . Years of education: Not on file  . Highest education level: Not on file  Occupational History  . Occupation: taken out of work due to back  Social Needs  . Financial resource strain: Not on file  . Food insecurity:    Worry: Not on file    Inability: Not on file  . Transportation needs:    Medical: Not on file      Non-medical: Not on file  Tobacco Use  . Smoking status: Former Smoker    Packs/day: 0.50    Years: 30.00    Pack years: 15.00    Types: Cigarettes    Start date: 04/15/1968    Last attempt to quit: 08/06/2016    Years since quitting: 1.3  . Smokeless tobacco: Never Used  . Tobacco comment: start back a couple of months ago  Substance and Sexual Activity  . Alcohol use: No    Alcohol/week: 0.0 standard drinks    Comment: 09/20/2012 "Used to; stopped ~ 2009; never had problem w/it"  . Drug use: No  . Sexual activity: Not on file  Lifestyle  . Physical activity:    Days per week: Not on file    Minutes per session: Not on file  . Stress: Not on file  Relationships  . Social connections:    Talks on phone: Not on file  Gets together: Not on file    Attends religious service: Not on file    Active member of club or organization: Not on file    Attends meetings of clubs or organizations: Not on file    Relationship status: Not on file  . Intimate partner violence:    Fear of current or ex partner: Not on file    Emotionally abused: Not on file    Physically abused: Not on file    Forced sexual activity: Not on file  Other Topics Concern  . Not on file  Social History Narrative   Regular exercise: rides horses   Caffeine use: occasionally    Past Surgical History:  Procedure Laterality Date  . ENTEROSCOPY N/A 03/01/2013   Procedure: ENTEROSCOPY;  Surgeon: Inda Castle, MD;  Location: [facility];  Service: Endoscopy;  Laterality: N/A;  . ENTEROSCOPY N/A 06/26/2016   Procedure: ENTEROSCOPY;  Surgeon: Milus Banister, MD;  Location: [facility];  Service: Endoscopy;  Laterality: N/A;  this is a push enteroscopy  . ENTEROSCOPY N/A 10/23/2016   Procedure: ENTEROSCOPY;  Surgeon: Gatha Mayer, MD;  Location: [facility] ENDOSCOPY;  Service: Endoscopy;  Laterality: N/A;  . GIVENS CAPSULE STUDY N/A 04/05/2015   Procedure: GIVENS CAPSULE STUDY;  Surgeon: Gatha Mayer, MD;   Location: [facility];  Service: Endoscopy;  Laterality: N/A;  . INGUINAL HERNIA REPAIR Right 2011  . LEFT HEART CATH AND CORONARY ANGIOGRAPHY N/A 12/23/2016   Procedure: LEFT HEART CATH AND CORONARY ANGIOGRAPHY;  Surgeon: Leonie Man, MD;  Location: [facility] CV LAB;  Service: Cardiovascular;  Laterality: N/A;  . LEFT HEART CATHETERIZATION WITH CORONARY ANGIOGRAM N/A 09/21/2012   Procedure: LEFT HEART CATHETERIZATION WITH CORONARY ANGIOGRAM;  Surgeon: Peter M Martinique, MD;  Location: [facility] CATH LAB;  Service: Cardiovascular;  Laterality: N/A;  . LUMBAR DISC SURGERY  1980's  . LYMPHADENECTOMY Bilateral 01/19/2013   Procedure: LYMPHADENECTOMY;  Surgeon: Bernestine Amass, MD;  Location: [facility];  Service: Urology;  Laterality: Bilateral;  . ROBOT ASSISTED LAPAROSCOPIC RADICAL PROSTATECTOMY N/A 01/19/2013   Procedure: ROBOTIC ASSISTED LAPAROSCOPIC RADICAL PROSTATECTOMY;  Surgeon: Bernestine Amass, MD;  Location: [facility];  Service: Urology;  Laterality: N/A;  . SHOULDER OPEN ROTATOR CUFF REPAIR Left 1980's    Family History  Problem Relation Age of Onset  . Stroke Father        Died at 81  . Diabetes Mother   . Heart attack Brother        Died at 92  . Diabetes Sister   . Stomach cancer Brother   . Heart attack Sister     Allergies  Allergen Reactions  . Valproic Acid Other (See Comments)    Current Outpatient Medications on File Prior to Visit  Medication Sig Dispense Refill  . amLODipine (NORVASC) 10 MG tablet 1 tab PO QD FOR HTN 90 tablet 3  . atorvastatin (LIPITOR) 80 MG tablet Take 1 tablet (80 mg total) by mouth at bedtime. 30 tablet 11  . calcium acetate, Phos Binder, (PHOSLYRA) 667 MG/5ML SOLN Take by mouth 3 (three) times daily with meals.    . carvedilol (COREG) 3.125 MG tablet Take 1 tablet (3.125 mg total) by mouth 2 (two) times daily with a meal. 60 tablet 11  . clopidogrel (PLAVIX) 75 MG tablet 1 tab  PO QD FOR ANTICOAGULANT 30 tablet 11  . donepezil (ARICEPT) 10 MG tablet  Take 1 tablet (10 mg total) by mouth at bedtime. 90 tablet 3  .  gabapentin (NEURONTIN) 100 MG capsule Take 1 capsule (100 mg total) by mouth 2 (two) times daily. 60 capsule 2  . gabapentin (NEURONTIN) 300 MG capsule 1 tab po q hs Monday, wed, Friday(dialyis day) 12 capsule 3  . multivitamin (RENA-VIT) TABS tablet Take 1 tablet by mouth every Monday, Wednesday, and Friday.     . pantoprazole (PROTONIX) 40 MG tablet Take 40 mg by mouth daily.    . QUEtiapine (SEROQUEL) 25 MG tablet Take 1 tablet (25 mg total) by mouth 3 (three) times daily. 90 tablet 3  . sertraline (ZOLOFT) 25 MG tablet 2 tabs  PO QD FOR ANTI DEPRESSANT 30 tablet 11   No current facility-administered medications on file prior to visit.     BP 111/66   Pulse 73   Temp 98.3 F (36.8 C) (Oral)   Resp 16   SpO2 100%       Objective:   Physical Exam  General Mental Status- Alert. General Appearance- Not in acute distress.   Skin General: Color- Normal Color. Moisture- Normal Moisture.  Neck Carotid Arteries- Normal color. Moisture- Normal Moisture. No carotid bruits. No JVD.  Chest and Lung Exam Auscultation: Breath Sounds:-Normal.  Cardiovascular Auscultation:Rythm- Regular. Murmurs & Other Heart Sounds:Auscultation of the heart reveals- No Murmurs.  Abdomen Inspection:-Inspeection Normal. Palpation/Percussion:Note:No mass. Palpation and Percussion of the abdomen reveal- Non Tender, Non Distended + BS, no rebound or guarding.    Neurologic Cranial Nerve exam:- CN III-XII intact(No nystagmus), symmetric smile. Strength:- 5/5 equal and symmetric strength both upper and lower extremities.      Assessment & Plan:  I am glad to hear that [name] feels better/has more energy and that he had procedure for the gastrointestinal bleed.  I do want to get CBC and metabolic panel today.  I want to confirm that his hemoglobin/hematocrit is stable compared to discharge date.  We will let you know results most likely  by tomorrow morning.  You report he had a lot of retained fluid likely from IV hydration when he was hospitalized.  He has dialysis tomorrow and hopefully they will be able to draw off more fluid.  Keep GI appointment in October.  If I see significant drop in hemoglobin/hematocrit then we will try to notify GI MD and see if they can get you in sooner for follow-up.  Follow-up date to be determined after lab review.  Lab had hard time drawing blood. So I wrote hand written order so he can get it done at dialysis tomorrow. Gave them my fax number and cell number.

## 2017-11-27 ENCOUNTER — Inpatient Hospital Stay: Payer: Medicare Other | Admitting: Medical

## 2017-11-27 NOTE — Telephone Encounter (Signed)
Author phoned [name] to give VO for PT and OT/ST evals per Mackie Pai, but phone disconnected. If [name] calls back OK for PEC to give.

## 2017-11-27 NOTE — Telephone Encounter (Signed)
[name] (intake nurse to call to give verbal to) Requesting verbals for: PT 2x a week for 4 weeks Speech & OT evaluations  Requesting a call back [phone]

## 2017-12-04 ENCOUNTER — Telehealth: Payer: Self-pay | Admitting: *Deleted

## 2017-12-04 NOTE — Telephone Encounter (Signed)
Received Lab Report results from [facility]; forwarded to provider/SLS 09/20

## 2017-12-08 ENCOUNTER — Telehealth: Payer: Self-pay | Admitting: Medical

## 2017-12-08 ENCOUNTER — Telehealth: Payer: Self-pay | Admitting: *Deleted

## 2017-12-08 NOTE — Telephone Encounter (Signed)
I will likely approve all requests but need clarification on order. Does nurse tech who sent message mean 2 times a week for one week only. What is duration of skilled nursing. And one time a week for 6 weeks both PT and OT.   Can you pass message to give call over to get clarification.

## 2017-12-08 NOTE — Telephone Encounter (Signed)
Received Home Health Certification and Plan of Care; forwarded to provider/SLS  

## 2017-12-08 NOTE — Telephone Encounter (Signed)
Copied from [place] [id number]. Topic: Quick Communication - See Telephone Encounter >> Dec 08, 2017  1:08 PM Conception Chancy, NT wrote: CRM for notification. See Telephone encounter for: 12/08/17.  Caryl Pina is a Marine scientist from [employer] and is requesting verbal orders for skilled nursing 2 week 1 and 1 week 6, PT and OT will come in this week for evaluation.  Cb# [phone]

## 2017-12-08 NOTE — Telephone Encounter (Signed)
Received multiple Physician Orders from [facility]; forwarded to provider/SLS 09/24

## 2017-12-08 NOTE — Telephone Encounter (Signed)
Received Home Health Certification and Plan of Care; forwarded to provider/SLS 09/24  

## 2017-12-09 NOTE — Telephone Encounter (Signed)
Author phoned Caryl Pina at [facility] at call back number provided [phone]) to clarify needed orders, but no answer, generic VM. Routed to [facility] to attempt to reach [facility] again later today.

## 2017-12-10 ENCOUNTER — Telehealth: Payer: Self-pay | Admitting: Medical

## 2017-12-10 ENCOUNTER — Ambulatory Visit (INDEPENDENT_AMBULATORY_CARE_PROVIDER_SITE_OTHER): Payer: Medicare Other | Admitting: Medical

## 2017-12-10 ENCOUNTER — Encounter: Payer: Self-pay | Admitting: Medical

## 2017-12-10 VITALS — BP 138/65 | HR 80 | Temp 98.9°F | Resp 16 | Ht 67.0 in

## 2017-12-10 DIAGNOSIS — Z8701 Personal history of pneumonia (recurrent): Secondary | ICD-10-CM | POA: Diagnosis not present

## 2017-12-10 DIAGNOSIS — D649 Anemia, unspecified: Secondary | ICD-10-CM | POA: Diagnosis not present

## 2017-12-10 DIAGNOSIS — I132 Hypertensive heart and chronic kidney disease with heart failure and with stage 5 chronic kidney disease, or end stage renal disease: Secondary | ICD-10-CM | POA: Diagnosis not present

## 2017-12-10 MED ORDER — ATORVASTATIN CALCIUM 80 MG PO TABS: 80.0000 mg | ORAL_TABLET | Freq: Every day | ORAL | 11 refills | Status: AC

## 2017-12-10 MED ORDER — DONEPEZIL HCL 10 MG PO TABS: 10.0000 mg | ORAL_TABLET | Freq: Every day | ORAL | 3 refills | Status: AC

## 2017-12-10 MED ORDER — CLOPIDOGREL BISULFATE 75 MG PO TABS: ORAL_TABLET | ORAL | 11 refills | Status: AC

## 2017-12-10 MED ORDER — QUETIAPINE FUMARATE 25 MG PO TABS: 25.0000 mg | ORAL_TABLET | Freq: Three times a day (TID) | ORAL | 3 refills | Status: AC

## 2017-12-10 MED ORDER — RENA-VITE PO TABS: 1.0000 | ORAL_TABLET | ORAL | 3 refills | Status: AC

## 2017-12-10 MED ORDER — SERTRALINE HCL 25 MG PO TABS: ORAL_TABLET | ORAL | 11 refills | Status: AC

## 2017-12-10 NOTE — Telephone Encounter (Signed)
Copied from [place] [id number]. Topic: General - Other >> Dec 10, 2017  3:41 PM Yvette Rack wrote: Reason for CRM: [place] Physical Therapist with [facility] requests verbal orders for PT for 2 times a week for 4 weeks starting next week. Cb# [phone]

## 2017-12-10 NOTE — Progress Notes (Addendum)
Subjective:    Patient ID: [name], male    DOB: 02/22/1946, 72 y.o.   MRN: [id number]  HPI  Pt in for follow up.  On care everwhere review appears admitted on 12-03-2017 and discharged on 12-06-2017.  He is very energetic today. Laughing and in good mood.   Pt not having any acute complaints.  Wife mentions that home health is not necessary presently.  Pt just recently had fever and admitted for probable sepsis. Blood cultures were negative. Pt was given zosyn and vancomycin. He was discharge on augmentin 1 tab daily. He will take last tablet over this weekend. Appears source of increase wbc was pneumonia on review of records.  On review his blood work shows hb/hct 11/32.7 on sept 19,2019. Over next 2 days hb/hct 9.2-9.3 hb/hct 27. Wife states stool is not black. Pt has no abdomen pain.  Pt does have appointment with GI in October.  Reviewed records in care everywhere today.  Review of Systems  Constitutional: Negative for chills, fatigue and fever.  Respiratory: Negative for cough, chest tightness, shortness of breath and wheezing.   Cardiovascular: Negative for chest pain and palpitations.  Gastrointestinal: Negative for abdominal pain.  Musculoskeletal: Negative for back pain and gait problem.  Skin: Negative for rash.  Hematological: Negative for adenopathy.  Psychiatric/Behavioral: Negative for behavioral problems, confusion, dysphoric mood and suicidal ideas. The patient is not nervous/anxious and is not hyperactive.     Past Medical History:  Diagnosis Date  . 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 Anemia, iron deficiency 03/24/2011   a. Recurrent GI bleed, tx with periodic iron infusions.  . 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 AVM (arteriovenous malformation) of duodenum, acquired    egds in 01/2012, 04/2011  . BPH (benign prostatic hyperplasia)   . CAD (coronary artery disease)    a. Cath 09/2012: moderate borderline CAD in mid LAD/small diagonal  branch, mild RCA stenosis, to be managed medically   . Chronic lower back pain   . Diabetic peripheral neuropathy ([place]) 2014   foot pain.  . Essential hypertension 02/01/2012  . Fatty liver    on CT of 11/2010  . GERD (gastroesophageal reflux disease)   . GI bleed    a. Recurrent GI bleed, tx with IV iron. b. Per heme notes - likely AVMs 04/2012 (tx with cauterization several months ago).  . Hematuria    a. Urology note scan from 07/2012: cystoscopy without evidence for bladder lesion, only lateral hypertrophy of posterior urethra, bladder impression from BPH. b. Pt states he had "some tests" scheduled for later in July 2014.  . High cholesterol   . Hyperlipidemia 02/01/2012  . Hypertension   . Hypertensive urgency 07/06/2016  . 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 Orthostatic hypotension    a. Tx with florinef.  . Peripheral neuropathy   . Polyp, colonic    Colonoscopy 01/2012 "benign" polyp  . Prostate cancer ([place])   . Sleep apnea    "had mask; couldn't sleep in it" (09/20/2012)  . Smoker   . SOB (shortness of breath) 07/06/2016  . Stage III chronic kidney disease ([name])    a. Stage 3 (DM with complications ->CKD, peripheral neuropathy).  . Stroke ([place])    x 2  . Symptomatic anemia 04/03/2015  . Tinea pedis 05/24/2012  . Tobacco use 02/01/2012  . Type 2 diabetes, uncontrolled, with neuropathy ([place]) 02/01/2012  . Type II diabetes mellitus ([place])    a. Dx 1994, uncontrolled.  Social History   Socioeconomic History  . Marital status: Married    Spouse name: Not on file  . Number of children: 2  . Years of education: Not on file  . Highest education level: Not on file  Occupational History  . Occupation: taken out of work due to back  Social Needs  . Financial resource strain: Not on file  . Food insecurity:    Worry: Not on file    Inability: Not on file  . Transportation needs:    Medical: Not on file    Non-medical: Not on file  Tobacco Use  .  Smoking status: Former Smoker    Packs/day: 0.50    Years: 30.00    Pack years: 15.00    Types: Cigarettes    Start date: 04/15/1968    Last attempt to quit: 08/06/2016    Years since quitting: 1.3  . Smokeless tobacco: Never Used  . Tobacco comment: start back a couple of months ago  Substance and Sexual Activity  . Alcohol use: No    Alcohol/week: 0.0 standard drinks    Comment: 09/20/2012 "Used to; stopped ~ 2009; never had problem w/it"  . Drug use: No  . Sexual activity: Not on file  Lifestyle  . Physical activity:    Days per week: Not on file    Minutes per session: Not on file  . Stress: Not on file  Relationships  . Social connections:    Talks on phone: Not on file    Gets together: Not on file    Attends religious service: Not on file    Active member of club or organization: Not on file    Attends meetings of clubs or organizations: Not on file    Relationship status: Not on file  . Intimate partner violence:    Fear of current or ex partner: Not on file    Emotionally abused: Not on file    Physically abused: Not on file    Forced sexual activity: Not on file  Other Topics Concern  . Not on file  Social History Narrative   Regular exercise: rides horses   Caffeine use: occasionally    Past Surgical History:  Procedure Laterality Date  . ENTEROSCOPY N/A 03/01/2013   Procedure: ENTEROSCOPY;  Surgeon: Inda Castle, MD;  Location: [facility];  Service: Endoscopy;  Laterality: N/A;  . ENTEROSCOPY N/A 06/26/2016   Procedure: ENTEROSCOPY;  Surgeon: Milus Banister, MD;  Location: [facility];  Service: Endoscopy;  Laterality: N/A;  this is a push enteroscopy  . ENTEROSCOPY N/A 10/23/2016   Procedure: ENTEROSCOPY;  Surgeon: Gatha Mayer, MD;  Location[facility] ENDOSCOPY;  Service: Endoscopy;  Laterality: N/A;  . GIVENS CAPSULE STUDY N/A 04/05/2015   Procedure: GIVENS CAPSULE STUDY;  Surgeon: Gatha Mayer, MD;  Location: [facility];  Service: Endoscopy;   Laterality: N/A;  . INGUINAL HERNIA REPAIR Right 2011  . LEFT HEART CATH AND CORONARY ANGIOGRAPHY N/A 12/23/2016   Procedure: LEFT HEART CATH AND CORONARY ANGIOGRAPHY;  Surgeon: Leonie Man, MD;  Location: [facility];  Service: Cardiovascular;  Laterality: N/A;  . LEFT HEART CATHETERIZATION WITH CORONARY ANGIOGRAM N/A 09/21/2012   Procedure: LEFT HEART CATHETERIZATION WITH CORONARY ANGIOGRAM;  Surgeon: Peter M Martinique, MD;  Location: [facility] CATH LAB;  Service: Cardiovascular;  Laterality: N/A;  . LUMBAR DISC SURGERY  1980's  . LYMPHADENECTOMY Bilateral 01/19/2013   Procedure: LYMPHADENECTOMY;  Surgeon: Bernestine Amass, MD;  Location: [facility];  Service: Urology;  Laterality: Bilateral;  . ROBOT ASSISTED LAPAROSCOPIC RADICAL PROSTATECTOMY N/A 01/19/2013   Procedure: ROBOTIC ASSISTED LAPAROSCOPIC RADICAL PROSTATECTOMY;  Surgeon: Bernestine Amass, MD;  Location: [facility];  Service: Urology;  Laterality: N/A;  . SHOULDER OPEN ROTATOR CUFF REPAIR Left 1980's    Family History  Problem Relation Age of Onset  . Stroke Father        Died at 57  . Diabetes Mother   . Heart attack Brother        Died at 3  . Diabetes Sister   . Stomach cancer Brother   . Heart attack Sister     Allergies  Allergen Reactions  . Valproic Acid Other (See Comments)    Current Outpatient Medications on File Prior to Visit  Medication Sig Dispense Refill  . calcium acetate, Phos Binder, (PHOSLYRA) 667 MG/5ML SOLN Take by mouth 3 (three) times daily with meals.    . carvedilol (COREG) 3.125 MG tablet Take 1 tablet (3.125 mg total) by mouth 2 (two) times daily with a meal. 60 tablet 11  . gabapentin (NEURONTIN) 100 MG capsule Take 1 capsule (100 mg total) by mouth 2 (two) times daily. 60 capsule 2  . gabapentin (NEURONTIN) 300 MG capsule 1 tab po q hs Monday, wed, Friday(dialyis day) 12 capsule 3  . pantoprazole (PROTONIX) 40 MG tablet Take 40 mg by mouth daily.    [name] Kitchen amLODipine (NORVASC) 5 MG tablet TK 1 T PO QD  5     No current facility-administered medications on file prior to visit.     BP 138/65   Pulse 80   Temp 98.9 F (37.2 C) (Oral)   Resp 16   SpO2 99%       Objective:   Physical Exam  General Appearance- Not in acute distress.  HEENT Eyes- Scleraeral/Conjuntiva-bilat- Not Yellow. Mouth & Throat- Normal.  Chest and Lung Exam Auscultation: Breath sounds:-Normal. Adventitious sounds:- No Adventitious sounds.  Cardiovascular Auscultation:Rythm - Regular. Heart Sounds -Normal heart sounds.  Abdomen Inspection:-Inspection Normal.  Palpation/Perucssion: Palpation and Percussion of the abdomen reveal- Non Tender, No Rebound tenderness, No rigidity(Guarding) and No Palpable abdominal masses.  Liver:-Normal.  Spleen:- Normal.    Back- no cva pain. Lower ext- no pedal edema.      Assessment & Plan:  [place] appears to be doing well today with good energy and is in a very good mood today.  Not reporting any recent infection signs and symptoms.  On review of [facility] records in care everywhere it appears that he was treated for pneumonia.  No cough on exam today.  No fevers,, chills or sweats.  Based record review, will get a chest x-ray repeat as that was recommended.  I am handwriting order to get CBC and CMP at the dialysis center.  Patient has been very difficult lab draw in the past.  He is going to dialysis tomorrow and will give them the hand written order.  Asked them to fax that order to our office and I will put in my cell phone number on that order as well. Will proceed with caution and follow the CBC as hemoglobin/hematocrit has been in the moderate anemic range.  Hemoglobin has been in the 9 range and hematocrit in the 27 range.  Continue current medications and refill those today.  Follow-up date to be determined after lab review.  Mackie Pai, PA-C

## 2017-12-10 NOTE — Telephone Encounter (Signed)
Called [name] at [facility] letf a message to call back to clarify orders.

## 2017-12-10 NOTE — Telephone Encounter (Signed)
Spoke w/ [place], verbal orders given.  

## 2017-12-10 NOTE — Patient Instructions (Addendum)
[name] appears to be doing well today with good energy and is in a very good mood today.  Not reporting any recent infection signs and symptoms.  On review of [facility] records in care everywhere it appears that he was treated for pneumonia.  No cough on exam today.  No fevers,, chills or sweats.  Based record review, will get a chest x-ray repeat as that was recommended.  I am handwriting order to get CBC and CMP at the dialysis center.  Patient has been very difficult lab draw in the past.  He is going to dialysis tomorrow and will give them the hand written order.  Asked them to fax that order to our office and I will put in my cell phone number on that order as well.  Will proceed with caution and follow the CBC as hemoglobin/hematocrit has been in the moderate anemic range.  Hemoglobin has been in the 9 range and hematocrit in the 27 range.  Continue current medications and refill those today.  Follow-up date to be determined after lab review.

## 2017-12-16 ENCOUNTER — Telehealth: Payer: Self-pay

## 2017-12-16 NOTE — Telephone Encounter (Signed)
I did call patient's wife tonight.  It was very late but wanted to see where he was with his mental status and if he was still agitated.  Wife mentions that yesterday after talking with the [place] that he did eventually improve.  Now he is back to his usual self.  Wife describes him as very pleasant and happy at this time.  That the way was on the last visit.  I explained to wife that there may be multiple causes of acute altered mental status.  So we need to be updated if his status changes and stays that way.  With his renal failure we might need to do some stat labs in the event that agitation returns end-stage.  Also explained to wife that Seroquel dosing change might be needed but we would need to be cautious and ge some advice from pharmacist or his nephrologist as to what might be safe dose increase.  She will update [place] if his mood/agitation level worsens again.

## 2017-12-16 NOTE — Telephone Encounter (Signed)
Copied from [place] [phone]. Topic: General - Other >> Dec 15, 2017  3:33 PM Wynetta Emery, [place] C wrote: Reason for CRM: pt's spouse called in to get a message to provider. Spouse says that pt has become aggressive and agitated, spouse says that shes not sure of which medication that is causing concern but he may need an increase. Spouse is requesting cb at:    CB: [phone]

## 2017-12-18 ENCOUNTER — Telehealth: Payer: Self-pay | Admitting: *Deleted

## 2017-12-18 NOTE — Telephone Encounter (Signed)
Received Physician Orders/Dismissal from [place] d/t Behavior from [facility]; forwarded to provider/SLS 10/04

## 2017-12-28 ENCOUNTER — Telehealth: Payer: Self-pay | Admitting: *Deleted

## 2017-12-28 NOTE — Telephone Encounter (Signed)
Received Home Health Certification and Plan of Care; forwarded to provider/SLS 10/14

## 2017-12-29 ENCOUNTER — Telehealth: Payer: Self-pay | Admitting: Medical

## 2017-12-29 NOTE — Telephone Encounter (Signed)
Copied from [place] [id number]. Topic: Quick Communication - Home Health Verbal Orders >> Dec 29, 2017  1:07 PM Margot Ables wrote: Caller/Agency: Constance Haw PT w/[facility] Callback Number: [phone] Requesting OT/PT/Skilled Nursing/Social Work: requesting VO to continue PT 2x week for 3 weeks, 1x week for 1 week. Also requesting ST evaluation Ok to leave detailed msg on secure VM

## 2017-12-29 NOTE — Telephone Encounter (Signed)
Would you go ahead and call back [name] at with  [facility]. Can give verbal order and if they would fax over order request then will sign.

## 2017-12-30 NOTE — Telephone Encounter (Signed)
Called and gave verbal.[facility] will fax over paper work

## 2018-01-01 ENCOUNTER — Telehealth: Payer: Self-pay | Admitting: *Deleted

## 2018-01-01 ENCOUNTER — Telehealth: Payer: Self-pay | Admitting: Medical

## 2018-01-01 NOTE — Telephone Encounter (Signed)
Received Physician Orders from [facility]; forwarded to provider/SLS 10/18

## 2018-01-01 NOTE — Telephone Encounter (Signed)
Copied from [place] [phone]. Topic: Quick Communication - Home Health Verbal Orders >> Jan 01, 2018  3:01 PM Rutherford Nail, [place] wrote: Caller/Agency: Will Osker Mason with [facility] [place] Number: [phone] Requesting OT/PT/Skilled Nursing/Social Work: OT Frequency: Scheduled for discharge visit. Wife requested to move to next Tuesday 01/05/18

## 2018-01-03 NOTE — Telephone Encounter (Signed)
Ok with PT, OT, and skilled nursing. You can call and get that scheduled. When is his follow up appointment?

## 2018-01-05 ENCOUNTER — Telehealth: Payer: Self-pay | Admitting: *Deleted

## 2018-01-05 NOTE — Telephone Encounter (Signed)
Verbal orders given to will. Pt doesn't have follow up scheduled

## 2018-01-05 NOTE — Telephone Encounter (Signed)
Received Physician Orders from [facility]; forwarded to provider/SLS 10/22

## 2018-01-14 ENCOUNTER — Telehealth: Payer: Self-pay | Admitting: *Deleted

## 2018-01-14 NOTE — Telephone Encounter (Signed)
Received Physician Orders from [facility]; forwarded to provider/SLS 10/31

## 2018-01-15 DEATH — deceased

## 2018-01-19 ENCOUNTER — Telehealth: Payer: Self-pay | Admitting: *Deleted

## 2018-01-19 NOTE — Telephone Encounter (Signed)
Received Missed Visit Report for review; forwarded to provider/SLS 11/05

## 2018-01-25 ENCOUNTER — Telehealth: Payer: Self-pay

## 2018-01-25 ENCOUNTER — Telehealth: Payer: Self-pay | Admitting: Medical

## 2018-01-25 NOTE — Telephone Encounter (Signed)
Sorry to hear that. Do we have sympathy cards we could sign and send to wife?

## 2018-01-25 NOTE — Telephone Encounter (Signed)
Provider is aware; message to update status in patient's chart with CRM note sent to [facility]/SLS 11/11

## 2018-01-25 NOTE — Telephone Encounter (Signed)
Copied from [place] [id number]. Topic: General - Deceased Patient >> February 15, 2018  4:29 PM [name] wrote: Reason for CRM: Pt's wife called and reported that pt passed away 2018/02/09.  Route to department's PEC Pool.

## 2018-02-14 DEATH — deceased

## 2019-02-04 IMAGING — CT CT HEAD W/O CM
4 series · 15 of 47 positions shown, 17 images · non-contrast
Comparison: 02/01/2017

CLINICAL DATA: Altered mental status for 2 days.

EXAM:
CT HEAD WITHOUT CONTRAST
TECHNIQUE: Contiguous axial images were obtained from the base of the skull
through the vertex without intravenous contrast.

[Series 3: head wo · axial · 0.42mm/px · z∈[-94,+26]mm · 7 of 32 slices shown, 9 images]
[im 4/32  brain]
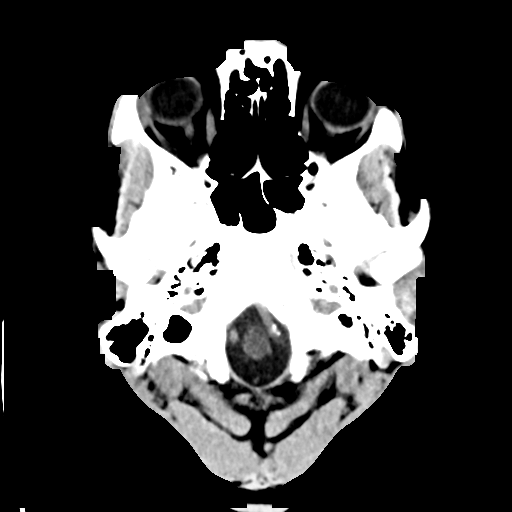
[im 4/32  bone]
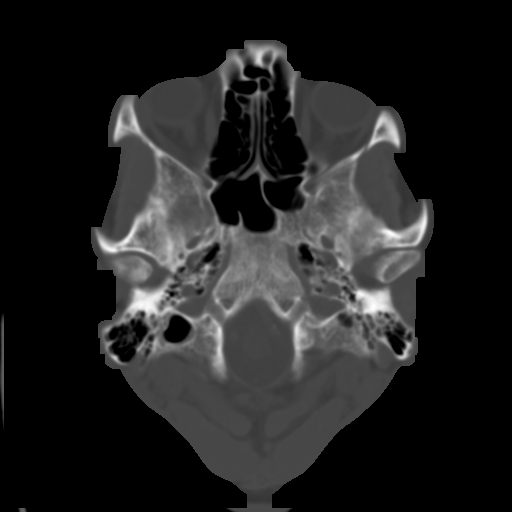
[im 8/32  brain]
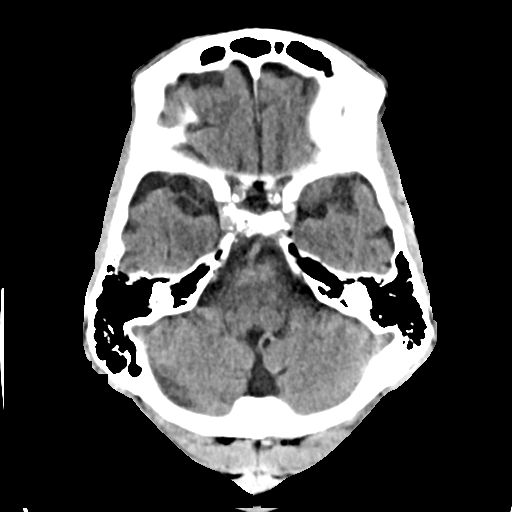
[im 12/32  brain]
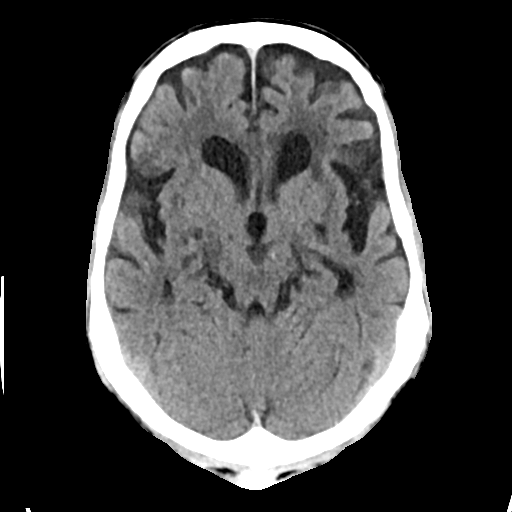
[im 16/32  brain]
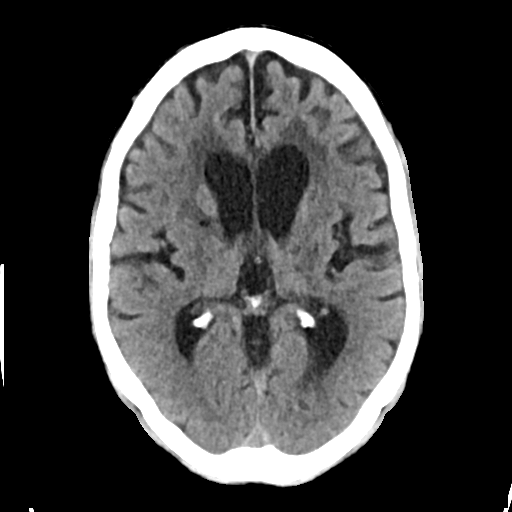
[im 20/32  brain]
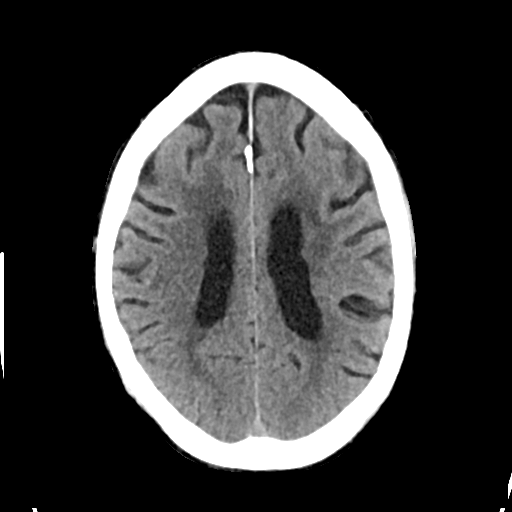
[im 20/32  bone]
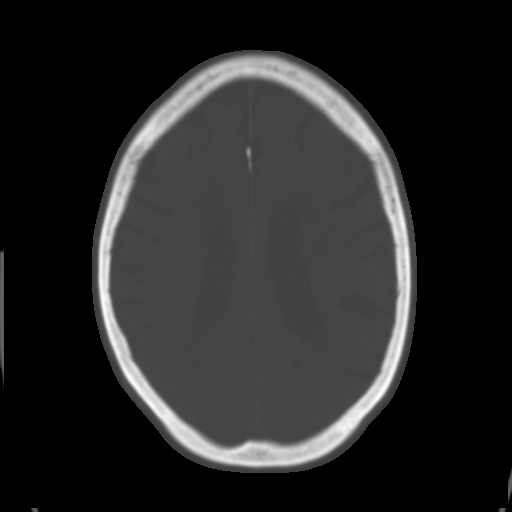
[im 24/32  brain]
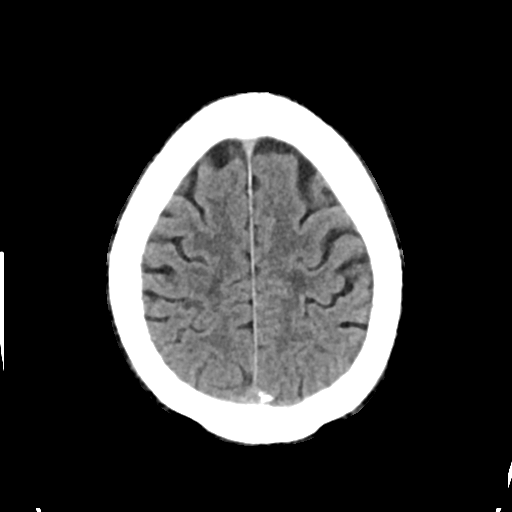
[im 28/32  brain]
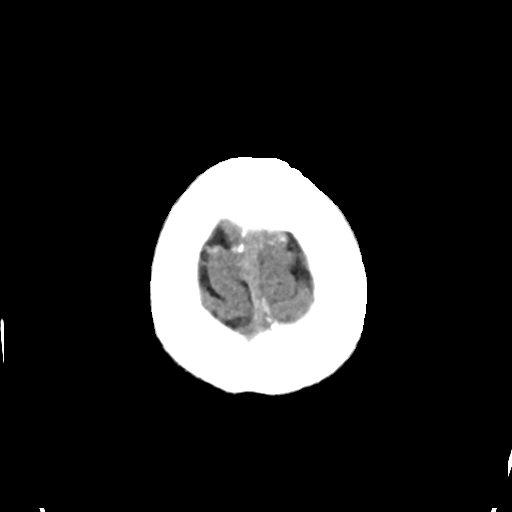

[Series 4: head bone · axial · 0.42mm/px · z∈[-94,-78]mm · 2 of 79 slices shown]
[im 8/79  bone]
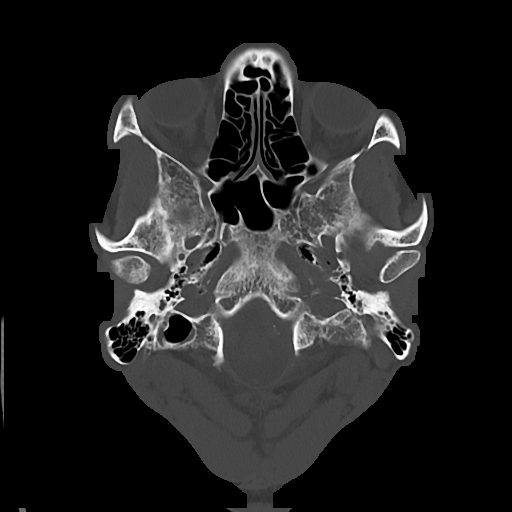
[im 16/79  bone]
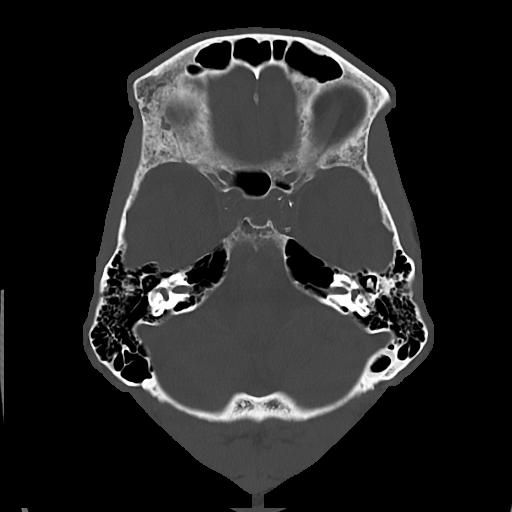

[Series 5: cor soft · coronal · 0.33mm/px · 3 of 68 slices shown]
[im 23/68  brain]
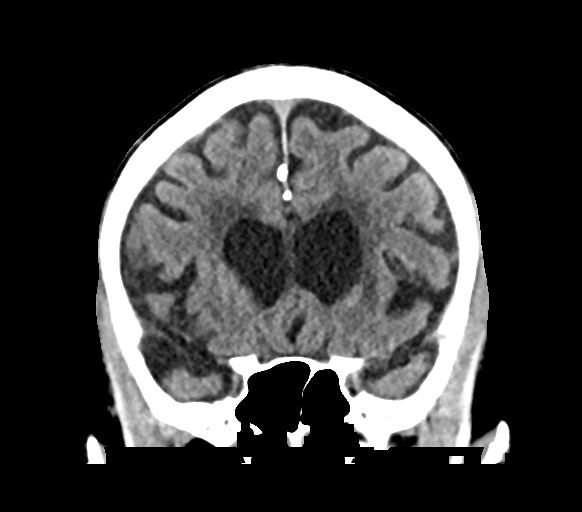
[im 30/68  brain]
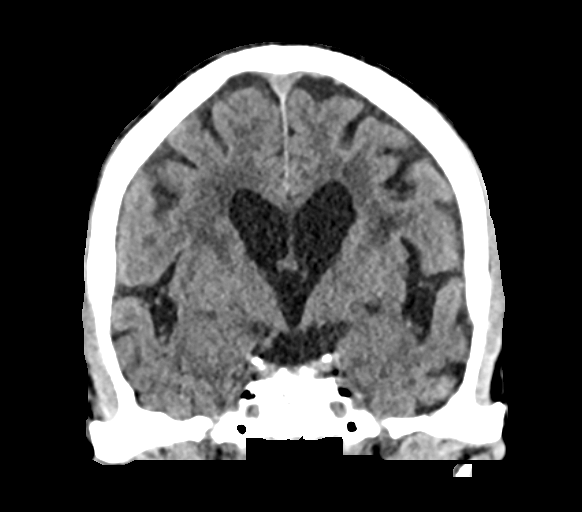
[im 38/68  brain]
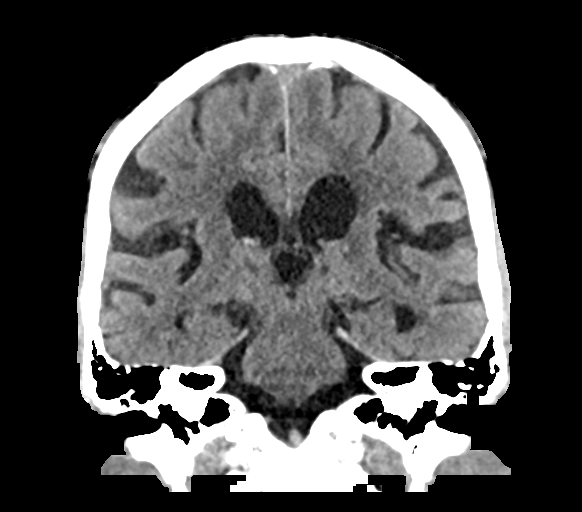

[Series 6: sag soft · sagittal · 0.33mm/px · 3 of 51 slices shown]
[im 17/51  brain]
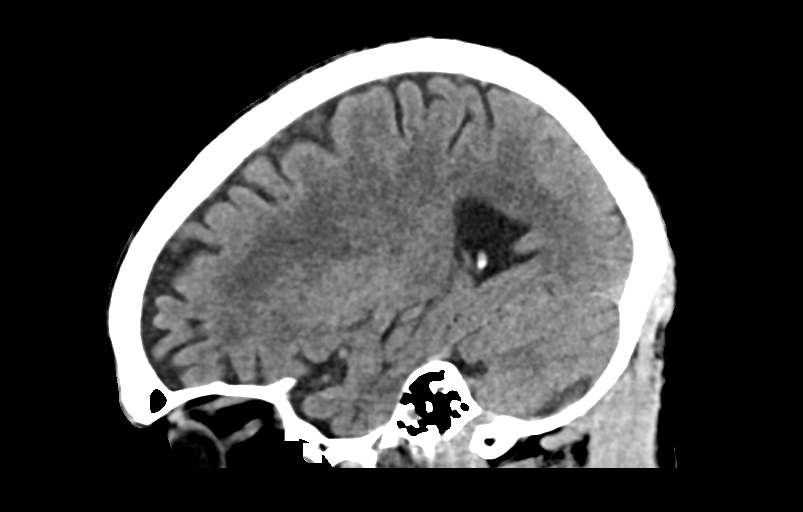
[im 26/51  brain]
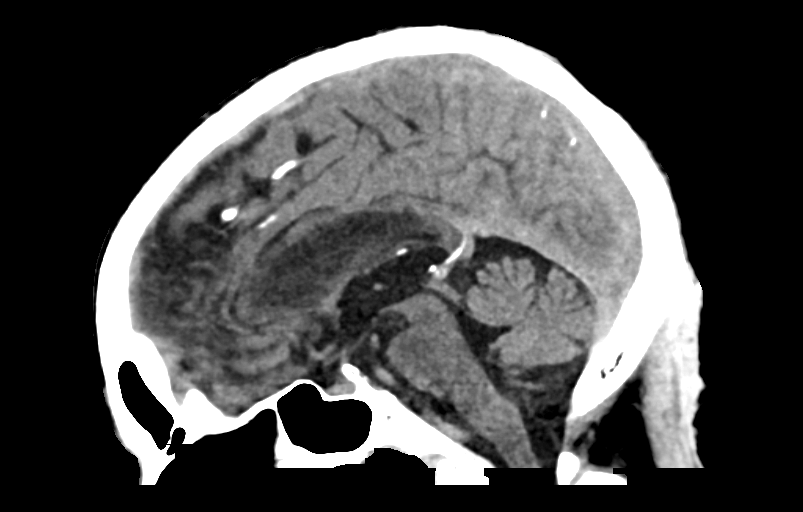
[im 34/51  brain]
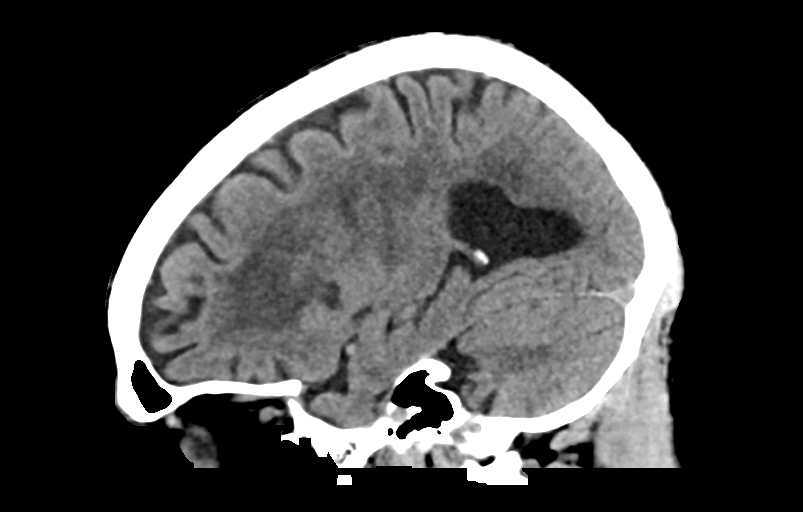

[15 of 47 positions shown; findings below may reference images not displayed]

FINDINGS: Brain: Left cerebral hemisphere subdural blood has resolved from the
previous examination. However, there is a new extra-axial
low-density collection adjacent to the inferior right cerebellar
hemisphere. This is seen on sequence 3, image 7 and best seen on the
coronal reformats, sequence 5, image 50. This low-density structure
is mildly heterogeneous. This structure measures 2.6 x 1.0 x 0.7 cm.
There is no significant mass effect from this new extra-axial
collection. No evidence for midline shift, hydrocephalus or large
infarct. Again noted is extensive white matter disease with old
ischemic changes and remote lacune infarcts in the deep white matter
tracts and deep gray nuclei.

Vascular: No hyperdense vessel or unexpected calcification.

Skull: Normal. Negative for fracture or focal lesion.

Sinuses/Orbits: No acute finding.

Other: None.
IMPRESSION: New small subdural collection in the right posterior fossa. Findings
are suggestive for a subacute subdural hematoma or hygroma.

The left cerebral hemisphere subdural hematoma has resolved since
02/01/2017.

Chronic white matter changes compatible with small vessel ischemic
disease and remote lacune infarcts.

## 2019-02-04 IMAGING — DX DG CHEST 1V PORT
1 series · 1 of 1 positions shown · non-contrast
Comparison: 12/22/2016

CLINICAL DATA: Altered mental status

EXAM:
PORTABLE CHEST 1 VIEW

[chest]
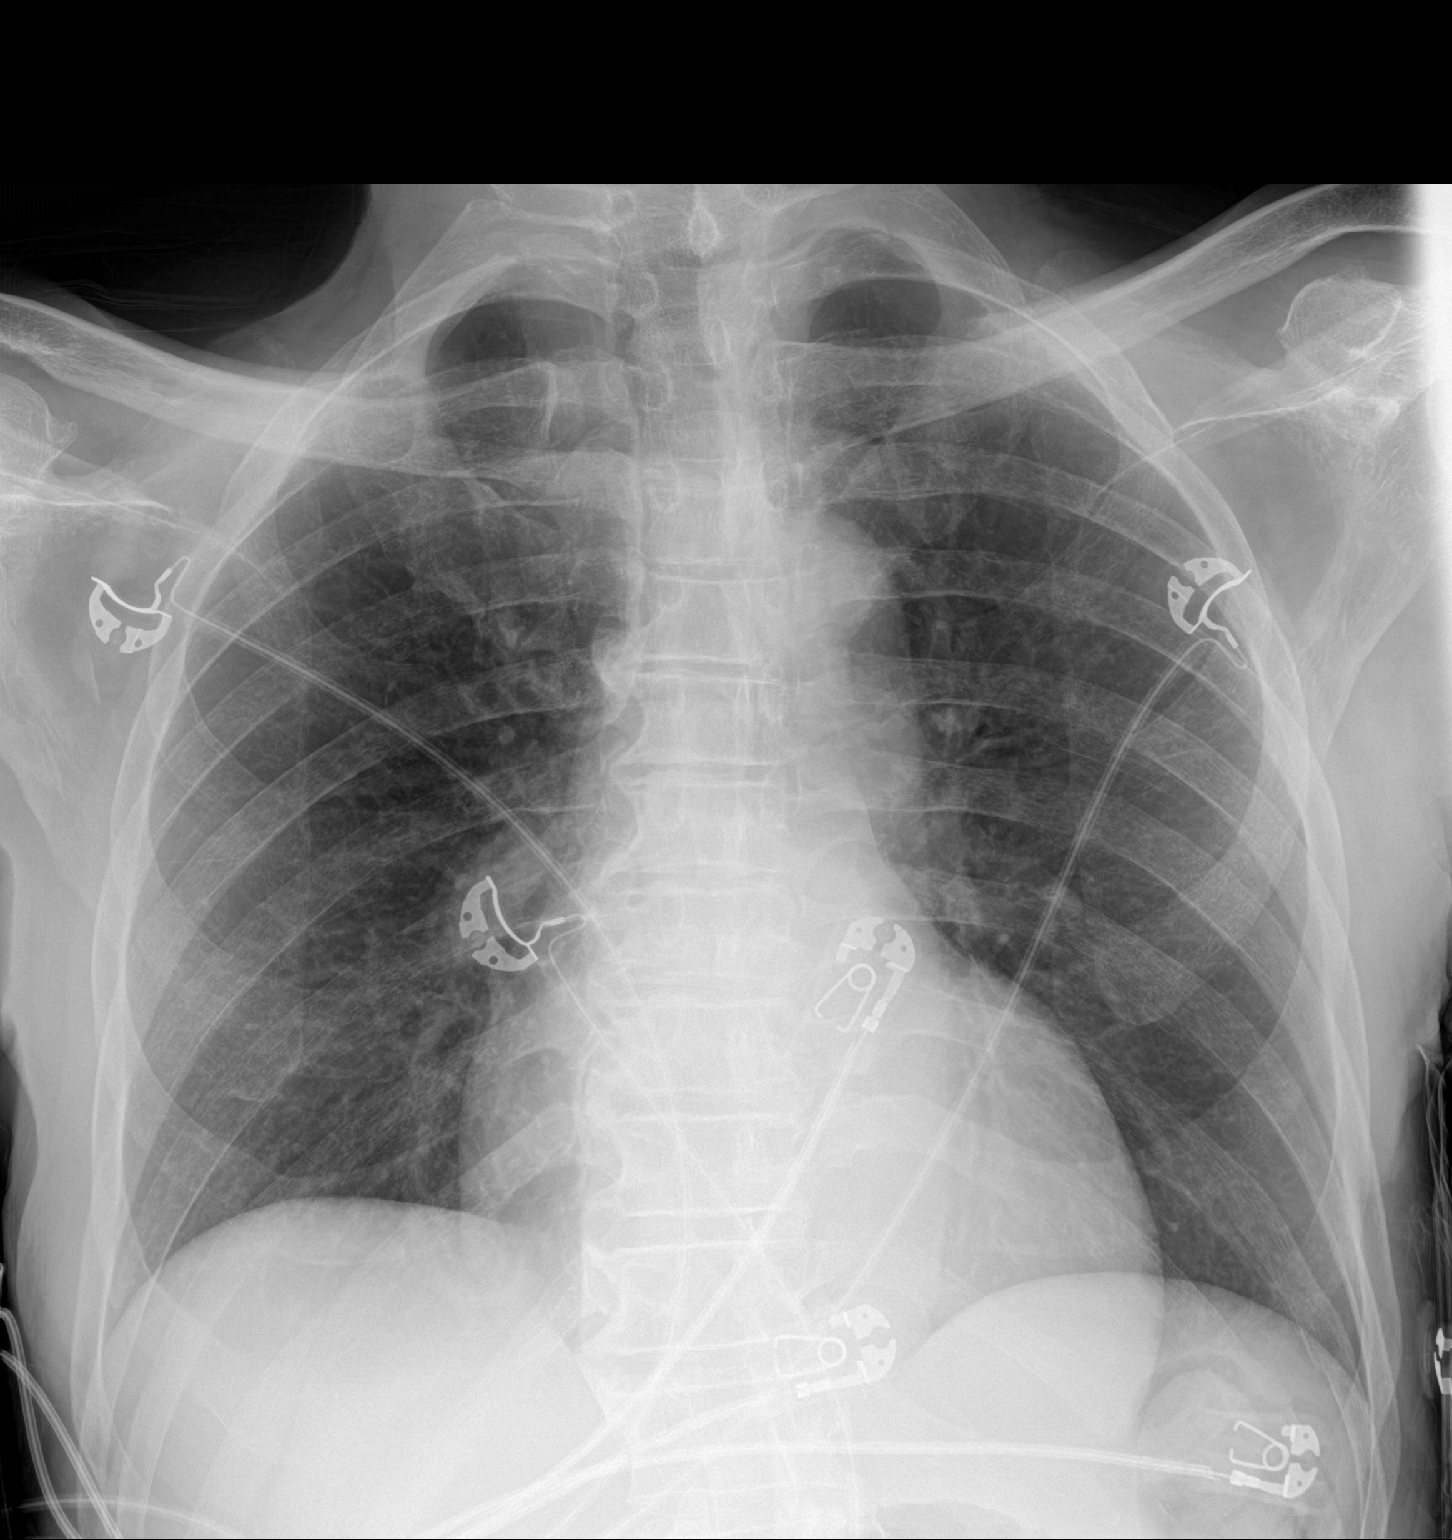

[1 of 1 positions shown; findings below may reference images not displayed]

FINDINGS: Mild cardiomegaly. No focal consolidation or effusion. Mild aortic
atherosclerosis. No pneumothorax.
IMPRESSION: Cardiomegaly.  Negative for edema or infiltrate.
